# Patient Record
Sex: Male | Born: 1960
Health system: Southern US, Community
[De-identification: ages and names within clinical notes are randomized; demographics above are authoritative.]

## PROBLEM LIST (undated history)

## (undated) DIAGNOSIS — M51369 Other intervertebral disc degeneration, lumbar region without mention of lumbar back pain or lower extremity pain: Secondary | ICD-10-CM

## (undated) DIAGNOSIS — I456 Pre-excitation syndrome: Secondary | ICD-10-CM

## (undated) DIAGNOSIS — N182 Chronic kidney disease, stage 2 (mild): Secondary | ICD-10-CM

## (undated) DIAGNOSIS — F419 Anxiety disorder, unspecified: Secondary | ICD-10-CM

## (undated) DIAGNOSIS — Z8719 Personal history of other diseases of the digestive system: Secondary | ICD-10-CM

## (undated) DIAGNOSIS — I2510 Atherosclerotic heart disease of native coronary artery without angina pectoris: Secondary | ICD-10-CM

## (undated) DIAGNOSIS — I10 Essential (primary) hypertension: Secondary | ICD-10-CM

## (undated) DIAGNOSIS — F17201 Nicotine dependence, unspecified, in remission: Secondary | ICD-10-CM

## (undated) DIAGNOSIS — H9190 Unspecified hearing loss, unspecified ear: Secondary | ICD-10-CM

## (undated) DIAGNOSIS — Z87442 Personal history of urinary calculi: Secondary | ICD-10-CM

## (undated) DIAGNOSIS — G629 Polyneuropathy, unspecified: Secondary | ICD-10-CM

## (undated) DIAGNOSIS — M5136 Other intervertebral disc degeneration, lumbar region: Secondary | ICD-10-CM

## (undated) DIAGNOSIS — Z95810 Presence of automatic (implantable) cardiac defibrillator: Secondary | ICD-10-CM

## (undated) DIAGNOSIS — R079 Chest pain, unspecified: Secondary | ICD-10-CM

## (undated) DIAGNOSIS — I219 Acute myocardial infarction, unspecified: Secondary | ICD-10-CM

## (undated) DIAGNOSIS — K2210 Ulcer of esophagus without bleeding: Secondary | ICD-10-CM

## (undated) DIAGNOSIS — G4730 Sleep apnea, unspecified: Secondary | ICD-10-CM

## (undated) DIAGNOSIS — N289 Disorder of kidney and ureter, unspecified: Secondary | ICD-10-CM

## (undated) DIAGNOSIS — G459 Transient cerebral ischemic attack, unspecified: Secondary | ICD-10-CM

## (undated) DIAGNOSIS — I420 Dilated cardiomyopathy: Secondary | ICD-10-CM

## (undated) DIAGNOSIS — Z972 Presence of dental prosthetic device (complete) (partial): Secondary | ICD-10-CM

## (undated) DIAGNOSIS — I639 Cerebral infarction, unspecified: Secondary | ICD-10-CM

## (undated) DIAGNOSIS — K219 Gastro-esophageal reflux disease without esophagitis: Secondary | ICD-10-CM

## (undated) DIAGNOSIS — I77810 Thoracic aortic ectasia: Secondary | ICD-10-CM

## (undated) HISTORY — DX: Nicotine dependence, unspecified, in remission: F17.201

## (undated) HISTORY — PX: CHOLECYSTECTOMY: SHX55

## (undated) HISTORY — PX: BACK SURGERY: SHX140

## (undated) HISTORY — PX: CHOLECYSTECTOMY, LAPAROSCOPIC: SHX56

## (undated) HISTORY — DX: Thoracic aortic ectasia: I77.810

## (undated) HISTORY — PX: CARDIAC DEFIBRILLATOR PLACEMENT: SHX171

## (undated) HISTORY — PX: CARDIAC CATHETERIZATION: SHX172

## (undated) HISTORY — PX: APPENDECTOMY: SHX54

## (undated) HISTORY — PX: MULTIPLE TOOTH EXTRACTIONS: SHX2053

## (undated) HISTORY — DX: Transient cerebral ischemic attack, unspecified: G45.9

## (undated) HISTORY — DX: Atherosclerotic heart disease of native coronary artery without angina pectoris: I25.10

---

## 1997-08-09 HISTORY — PX: RADIOFREQUENCY ABLATION: SHX2290

## 2000-12-02 ENCOUNTER — Encounter (HOSPITAL_COMMUNITY): Admission: RE | Admit: 2000-12-02 | Discharge: 2001-01-01 | Payer: Self-pay | Admitting: Oncology

## 2000-12-02 ENCOUNTER — Encounter: Admission: RE | Admit: 2000-12-02 | Discharge: 2000-12-02 | Payer: Self-pay | Admitting: Oncology

## 2000-12-23 ENCOUNTER — Encounter (HOSPITAL_COMMUNITY): Admission: RE | Admit: 2000-12-23 | Discharge: 2001-01-22 | Payer: Self-pay | Admitting: Oncology

## 2000-12-23 ENCOUNTER — Encounter (HOSPITAL_COMMUNITY): Payer: Self-pay | Admitting: Oncology

## 2002-05-01 ENCOUNTER — Encounter: Payer: Self-pay | Admitting: Family Medicine

## 2002-05-01 ENCOUNTER — Ambulatory Visit (HOSPITAL_COMMUNITY): Admission: RE | Admit: 2002-05-01 | Discharge: 2002-05-01 | Payer: Self-pay | Admitting: Family Medicine

## 2002-05-17 ENCOUNTER — Ambulatory Visit (HOSPITAL_COMMUNITY): Admission: RE | Admit: 2002-05-17 | Discharge: 2002-05-17 | Payer: Self-pay | Admitting: Pulmonary Disease

## 2002-06-21 ENCOUNTER — Encounter (HOSPITAL_COMMUNITY): Admission: RE | Admit: 2002-06-21 | Discharge: 2002-07-21 | Payer: Self-pay | Admitting: Cardiology

## 2002-06-21 ENCOUNTER — Encounter: Payer: Self-pay | Admitting: Cardiology

## 2002-07-03 ENCOUNTER — Ambulatory Visit (HOSPITAL_COMMUNITY): Admission: RE | Admit: 2002-07-03 | Discharge: 2002-07-03 | Payer: Self-pay | Admitting: Cardiology

## 2002-07-19 ENCOUNTER — Ambulatory Visit (HOSPITAL_COMMUNITY): Admission: RE | Admit: 2002-07-19 | Discharge: 2002-07-19 | Payer: Self-pay | Admitting: Cardiology

## 2002-12-05 ENCOUNTER — Encounter: Payer: Self-pay | Admitting: *Deleted

## 2002-12-05 ENCOUNTER — Emergency Department (HOSPITAL_COMMUNITY): Admission: EM | Admit: 2002-12-05 | Discharge: 2002-12-05 | Payer: Self-pay | Admitting: *Deleted

## 2003-07-12 ENCOUNTER — Ambulatory Visit (HOSPITAL_COMMUNITY): Admission: RE | Admit: 2003-07-12 | Discharge: 2003-07-12 | Payer: Self-pay | Admitting: Family Medicine

## 2003-07-15 ENCOUNTER — Ambulatory Visit (HOSPITAL_COMMUNITY): Admission: RE | Admit: 2003-07-15 | Discharge: 2003-07-15 | Payer: Self-pay | Admitting: Family Medicine

## 2003-12-24 ENCOUNTER — Ambulatory Visit (HOSPITAL_COMMUNITY): Admission: RE | Admit: 2003-12-24 | Discharge: 2003-12-24 | Payer: Self-pay | Admitting: *Deleted

## 2003-12-30 ENCOUNTER — Ambulatory Visit (HOSPITAL_COMMUNITY): Admission: RE | Admit: 2003-12-30 | Discharge: 2003-12-30 | Payer: Self-pay | Admitting: Family Medicine

## 2004-03-20 ENCOUNTER — Ambulatory Visit (HOSPITAL_COMMUNITY): Admission: RE | Admit: 2004-03-20 | Discharge: 2004-03-20 | Payer: Self-pay | Admitting: Family Medicine

## 2005-01-10 ENCOUNTER — Emergency Department (HOSPITAL_COMMUNITY): Admission: EM | Admit: 2005-01-10 | Discharge: 2005-01-10 | Payer: Self-pay | Admitting: Emergency Medicine

## 2005-02-26 ENCOUNTER — Ambulatory Visit (HOSPITAL_COMMUNITY): Admission: RE | Admit: 2005-02-26 | Discharge: 2005-02-26 | Payer: Self-pay | Admitting: Internal Medicine

## 2005-03-04 ENCOUNTER — Ambulatory Visit (HOSPITAL_COMMUNITY): Admission: RE | Admit: 2005-03-04 | Discharge: 2005-03-04 | Payer: Self-pay | Admitting: Family Medicine

## 2006-07-25 ENCOUNTER — Ambulatory Visit (HOSPITAL_COMMUNITY): Admission: RE | Admit: 2006-07-25 | Discharge: 2006-07-25 | Payer: Self-pay | Admitting: Internal Medicine

## 2007-08-10 HISTORY — PX: COLONOSCOPY W/ POLYPECTOMY: SHX1380

## 2007-10-25 ENCOUNTER — Observation Stay (HOSPITAL_COMMUNITY): Admission: EM | Admit: 2007-10-25 | Discharge: 2007-10-26 | Payer: Self-pay | Admitting: Cardiology

## 2007-10-25 ENCOUNTER — Encounter: Payer: Self-pay | Admitting: Emergency Medicine

## 2007-10-25 ENCOUNTER — Ambulatory Visit: Payer: Self-pay | Admitting: Cardiology

## 2007-10-26 ENCOUNTER — Encounter: Payer: Self-pay | Admitting: Cardiology

## 2007-11-02 ENCOUNTER — Ambulatory Visit: Payer: Self-pay | Admitting: Cardiovascular Disease

## 2007-11-06 ENCOUNTER — Ambulatory Visit (HOSPITAL_COMMUNITY): Admission: RE | Admit: 2007-11-06 | Discharge: 2007-11-06 | Payer: Self-pay | Admitting: Cardiovascular Disease

## 2007-11-07 ENCOUNTER — Ambulatory Visit: Payer: Self-pay | Admitting: Cardiology

## 2007-11-08 ENCOUNTER — Ambulatory Visit: Payer: Self-pay | Admitting: Internal Medicine

## 2007-11-08 DIAGNOSIS — Z95810 Presence of automatic (implantable) cardiac defibrillator: Secondary | ICD-10-CM

## 2007-11-08 HISTORY — DX: Presence of automatic (implantable) cardiac defibrillator: Z95.810

## 2007-11-10 ENCOUNTER — Encounter (HOSPITAL_COMMUNITY): Admission: RE | Admit: 2007-11-10 | Discharge: 2007-12-10 | Payer: Self-pay | Admitting: Internal Medicine

## 2007-11-21 ENCOUNTER — Ambulatory Visit: Payer: Self-pay | Admitting: Internal Medicine

## 2007-11-21 ENCOUNTER — Inpatient Hospital Stay (HOSPITAL_COMMUNITY): Admission: RE | Admit: 2007-11-21 | Discharge: 2007-11-22 | Payer: Self-pay | Admitting: Internal Medicine

## 2007-12-04 ENCOUNTER — Ambulatory Visit: Payer: Self-pay

## 2007-12-04 ENCOUNTER — Encounter: Payer: Self-pay | Admitting: Internal Medicine

## 2007-12-06 ENCOUNTER — Ambulatory Visit (HOSPITAL_COMMUNITY): Admission: RE | Admit: 2007-12-06 | Discharge: 2007-12-06 | Payer: Self-pay | Admitting: General Surgery

## 2007-12-06 ENCOUNTER — Encounter (INDEPENDENT_AMBULATORY_CARE_PROVIDER_SITE_OTHER): Payer: Self-pay | Admitting: General Surgery

## 2008-02-08 ENCOUNTER — Ambulatory Visit (HOSPITAL_COMMUNITY): Admission: RE | Admit: 2008-02-08 | Discharge: 2008-02-08 | Payer: Self-pay | Admitting: Internal Medicine

## 2008-02-18 ENCOUNTER — Encounter: Payer: Self-pay | Admitting: Cardiovascular Disease

## 2008-02-18 ENCOUNTER — Ambulatory Visit: Payer: Self-pay | Admitting: Cardiovascular Disease

## 2008-02-18 ENCOUNTER — Inpatient Hospital Stay (HOSPITAL_COMMUNITY): Admission: EM | Admit: 2008-02-18 | Discharge: 2008-02-20 | Payer: Self-pay | Admitting: Emergency Medicine

## 2008-03-05 ENCOUNTER — Ambulatory Visit: Payer: Self-pay | Admitting: Cardiology

## 2008-03-05 ENCOUNTER — Encounter (HOSPITAL_COMMUNITY): Admission: RE | Admit: 2008-03-05 | Discharge: 2008-04-04 | Payer: Self-pay | Admitting: Cardiology

## 2008-04-03 ENCOUNTER — Ambulatory Visit: Payer: Self-pay | Admitting: Internal Medicine

## 2008-04-23 ENCOUNTER — Ambulatory Visit: Payer: Self-pay | Admitting: Cardiology

## 2008-04-24 ENCOUNTER — Ambulatory Visit (HOSPITAL_COMMUNITY): Admission: RE | Admit: 2008-04-24 | Discharge: 2008-04-24 | Payer: Self-pay | Admitting: Cardiology

## 2008-05-20 ENCOUNTER — Ambulatory Visit: Payer: Self-pay | Admitting: Cardiology

## 2008-06-20 ENCOUNTER — Ambulatory Visit: Payer: Self-pay | Admitting: Cardiology

## 2008-06-25 ENCOUNTER — Encounter (HOSPITAL_COMMUNITY): Admission: RE | Admit: 2008-06-25 | Discharge: 2008-07-25 | Payer: Self-pay | Admitting: Cardiology

## 2008-07-01 ENCOUNTER — Ambulatory Visit: Payer: Self-pay | Admitting: Internal Medicine

## 2008-08-21 ENCOUNTER — Ambulatory Visit: Payer: Self-pay | Admitting: Gastroenterology

## 2008-08-22 ENCOUNTER — Encounter: Payer: Self-pay | Admitting: Gastroenterology

## 2008-08-22 LAB — CONVERTED CEMR LAB
A-1 Antitrypsin, Ser: 148 mg/dL (ref 83–200)
Anti Nuclear Antibody(ANA): NEGATIVE
Ceruloplasmin: 34 mg/dL (ref 21–63)
IgA: 75 mg/dL (ref 68–378)
TIBC: 316 ug/dL (ref 215–435)
UIBC: 184 ug/dL

## 2008-08-27 ENCOUNTER — Encounter: Payer: Self-pay | Admitting: Gastroenterology

## 2008-08-27 ENCOUNTER — Ambulatory Visit: Payer: Self-pay | Admitting: Gastroenterology

## 2008-08-27 ENCOUNTER — Ambulatory Visit (HOSPITAL_COMMUNITY): Admission: RE | Admit: 2008-08-27 | Discharge: 2008-08-27 | Payer: Self-pay | Admitting: Gastroenterology

## 2008-09-04 ENCOUNTER — Ambulatory Visit (HOSPITAL_COMMUNITY): Admission: RE | Admit: 2008-09-04 | Discharge: 2008-09-04 | Payer: Self-pay | Admitting: Family Medicine

## 2008-09-30 ENCOUNTER — Ambulatory Visit: Payer: Self-pay | Admitting: Internal Medicine

## 2008-10-18 ENCOUNTER — Encounter: Payer: Self-pay | Admitting: Internal Medicine

## 2008-10-30 ENCOUNTER — Ambulatory Visit: Payer: Self-pay | Admitting: Cardiology

## 2008-10-31 DIAGNOSIS — K219 Gastro-esophageal reflux disease without esophagitis: Secondary | ICD-10-CM | POA: Insufficient documentation

## 2008-10-31 DIAGNOSIS — D126 Benign neoplasm of colon, unspecified: Secondary | ICD-10-CM

## 2008-10-31 DIAGNOSIS — H9190 Unspecified hearing loss, unspecified ear: Secondary | ICD-10-CM | POA: Insufficient documentation

## 2008-12-07 ENCOUNTER — Encounter: Payer: Self-pay | Admitting: Orthopedic Surgery

## 2008-12-07 ENCOUNTER — Emergency Department (HOSPITAL_COMMUNITY): Admission: EM | Admit: 2008-12-07 | Discharge: 2008-12-07 | Payer: Self-pay | Admitting: Emergency Medicine

## 2008-12-09 ENCOUNTER — Ambulatory Visit: Payer: Self-pay | Admitting: Orthopedic Surgery

## 2008-12-09 DIAGNOSIS — M25569 Pain in unspecified knee: Secondary | ICD-10-CM

## 2008-12-10 ENCOUNTER — Encounter: Payer: Self-pay | Admitting: Orthopedic Surgery

## 2008-12-24 DIAGNOSIS — D751 Secondary polycythemia: Secondary | ICD-10-CM | POA: Insufficient documentation

## 2008-12-24 DIAGNOSIS — G5600 Carpal tunnel syndrome, unspecified upper limb: Secondary | ICD-10-CM

## 2008-12-25 ENCOUNTER — Ambulatory Visit: Payer: Self-pay | Admitting: Internal Medicine

## 2008-12-25 ENCOUNTER — Encounter: Payer: Self-pay | Admitting: Internal Medicine

## 2009-02-12 ENCOUNTER — Encounter (INDEPENDENT_AMBULATORY_CARE_PROVIDER_SITE_OTHER): Payer: Self-pay | Admitting: *Deleted

## 2009-03-24 ENCOUNTER — Encounter: Payer: Self-pay | Admitting: Internal Medicine

## 2009-03-24 ENCOUNTER — Ambulatory Visit: Payer: Self-pay | Admitting: Internal Medicine

## 2009-03-31 ENCOUNTER — Encounter: Payer: Self-pay | Admitting: Internal Medicine

## 2009-05-09 ENCOUNTER — Encounter: Payer: Self-pay | Admitting: Cardiology

## 2009-06-23 ENCOUNTER — Ambulatory Visit: Payer: Self-pay | Admitting: Internal Medicine

## 2009-06-27 ENCOUNTER — Ambulatory Visit: Payer: Self-pay | Admitting: Cardiology

## 2009-06-27 DIAGNOSIS — M109 Gout, unspecified: Secondary | ICD-10-CM | POA: Insufficient documentation

## 2009-06-27 DIAGNOSIS — F17201 Nicotine dependence, unspecified, in remission: Secondary | ICD-10-CM

## 2009-06-27 DIAGNOSIS — R740 Nonspecific elevation of levels of transaminase and lactic acid dehydrogenase [LDH]: Secondary | ICD-10-CM

## 2009-06-27 DIAGNOSIS — E1159 Type 2 diabetes mellitus with other circulatory complications: Secondary | ICD-10-CM

## 2009-06-27 HISTORY — DX: Nicotine dependence, unspecified, in remission: F17.201

## 2009-06-30 ENCOUNTER — Encounter: Payer: Self-pay | Admitting: Cardiology

## 2009-06-30 ENCOUNTER — Encounter (INDEPENDENT_AMBULATORY_CARE_PROVIDER_SITE_OTHER): Payer: Self-pay | Admitting: *Deleted

## 2009-06-30 LAB — CONVERTED CEMR LAB
ALT: 26 units/L
ALT: 26 units/L (ref 0–53)
AST: 18 units/L
AST: 18 units/L (ref 0–37)
Albumin: 4.9 g/dL
Albumin: 4.9 g/dL (ref 3.5–5.2)
Alkaline Phosphatase: 118 units/L
Alkaline Phosphatase: 118 units/L — ABNORMAL HIGH (ref 39–117)
BUN: 19 mg/dL
Basophils Absolute: 0 10*3/uL
Basophils Absolute: 0 10*3/uL (ref 0.0–0.1)
Basophils Relative: 0 %
Basophils Relative: 0 % (ref 0–1)
Calcium: 9.6 mg/dL
Eosinophils Absolute: 0.1 10*3/uL
Glucose, Bld: 103 mg/dL
HCT: 49.2 %
Hemoglobin: 16.7 g/dL
Hemoglobin: 16.7 g/dL (ref 13.0–17.0)
Lymphocytes Relative: 15 % (ref 12–46)
MCHC: 33.9 g/dL (ref 30.0–36.0)
MCV: 94.8 fL
Monocytes Absolute: 0.6 10*3/uL (ref 0.1–1.0)
Monocytes Relative: 7 %
Neutro Abs: 6.1 10*3/uL (ref 1.7–7.7)
Neutrophils Relative %: 77 % (ref 43–77)
Platelets: 143 10*3/uL
Platelets: 143 10*3/uL — ABNORMAL LOW (ref 150–400)
Potassium: 4.4 meq/L
Potassium: 4.4 meq/L (ref 3.5–5.3)
Pro B Natriuretic peptide (BNP): 7.2 pg/mL (ref 0.0–100.0)
RDW: 13.9 %
RDW: 13.9 % (ref 11.5–15.5)
Sodium: 139 meq/L
Sodium: 139 meq/L (ref 135–145)
Total Bilirubin: 0.8 mg/dL (ref 0.3–1.2)
Total Protein: 6.9 g/dL (ref 6.0–8.3)

## 2009-07-01 ENCOUNTER — Encounter (INDEPENDENT_AMBULATORY_CARE_PROVIDER_SITE_OTHER): Payer: Self-pay | Admitting: *Deleted

## 2009-07-02 ENCOUNTER — Encounter: Payer: Self-pay | Admitting: Internal Medicine

## 2009-07-17 IMAGING — CR DG KNEE COMPLETE 4+V*R*
4 series · 4 of 4 positions shown · non-contrast
Comparison: None

CLINICAL DATA: Knee pain and swelling.

RIGHT KNEE - COMPLETE 4+ VIEW

[view not recorded (1 of 4)]
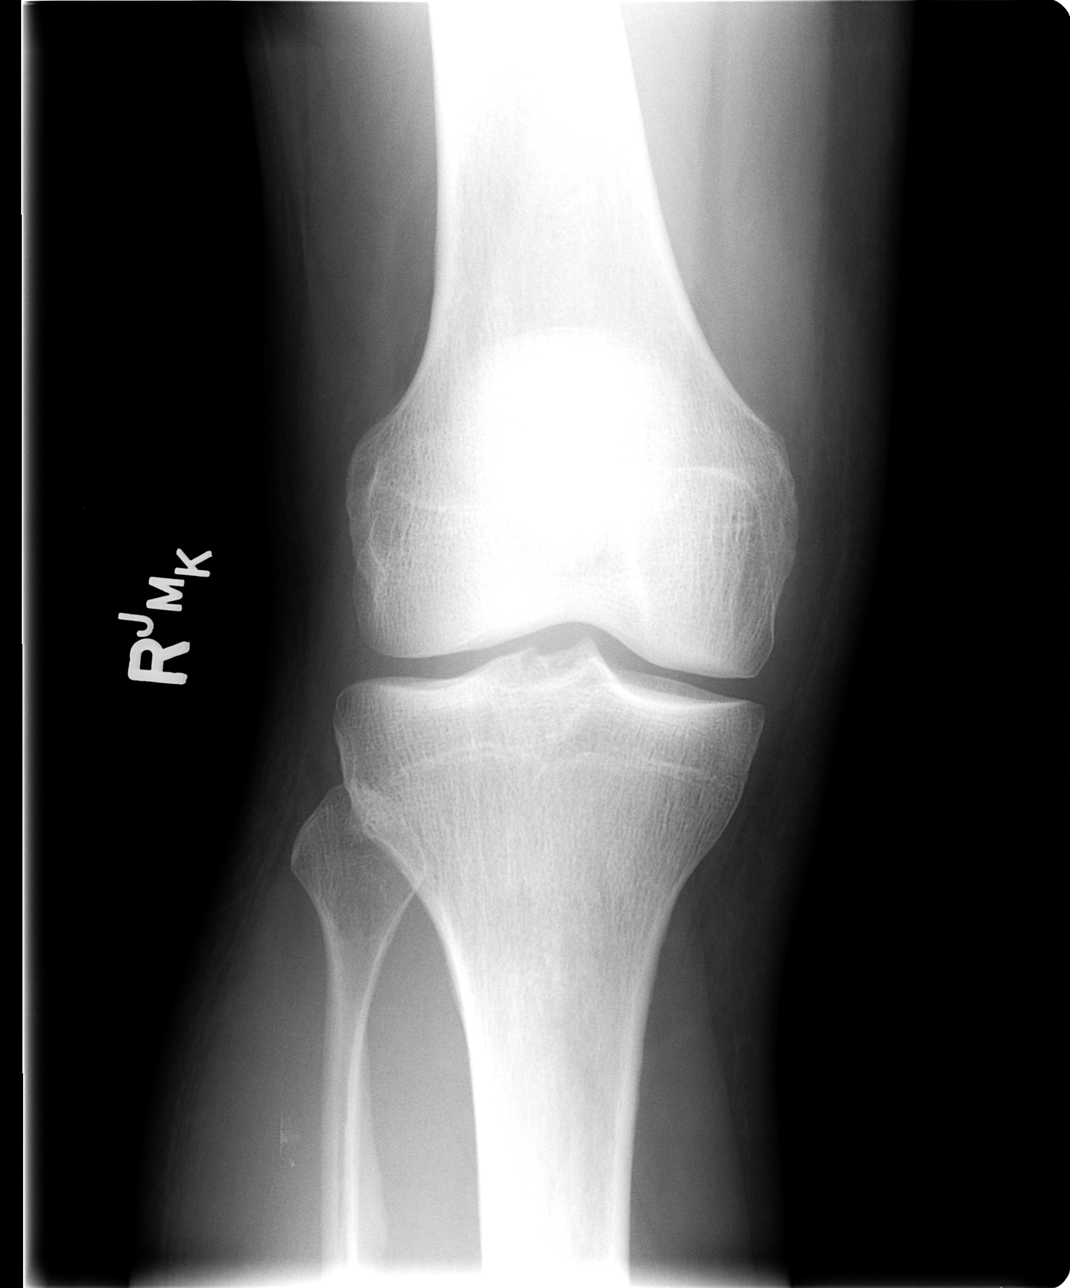

[view not recorded (2 of 4)]
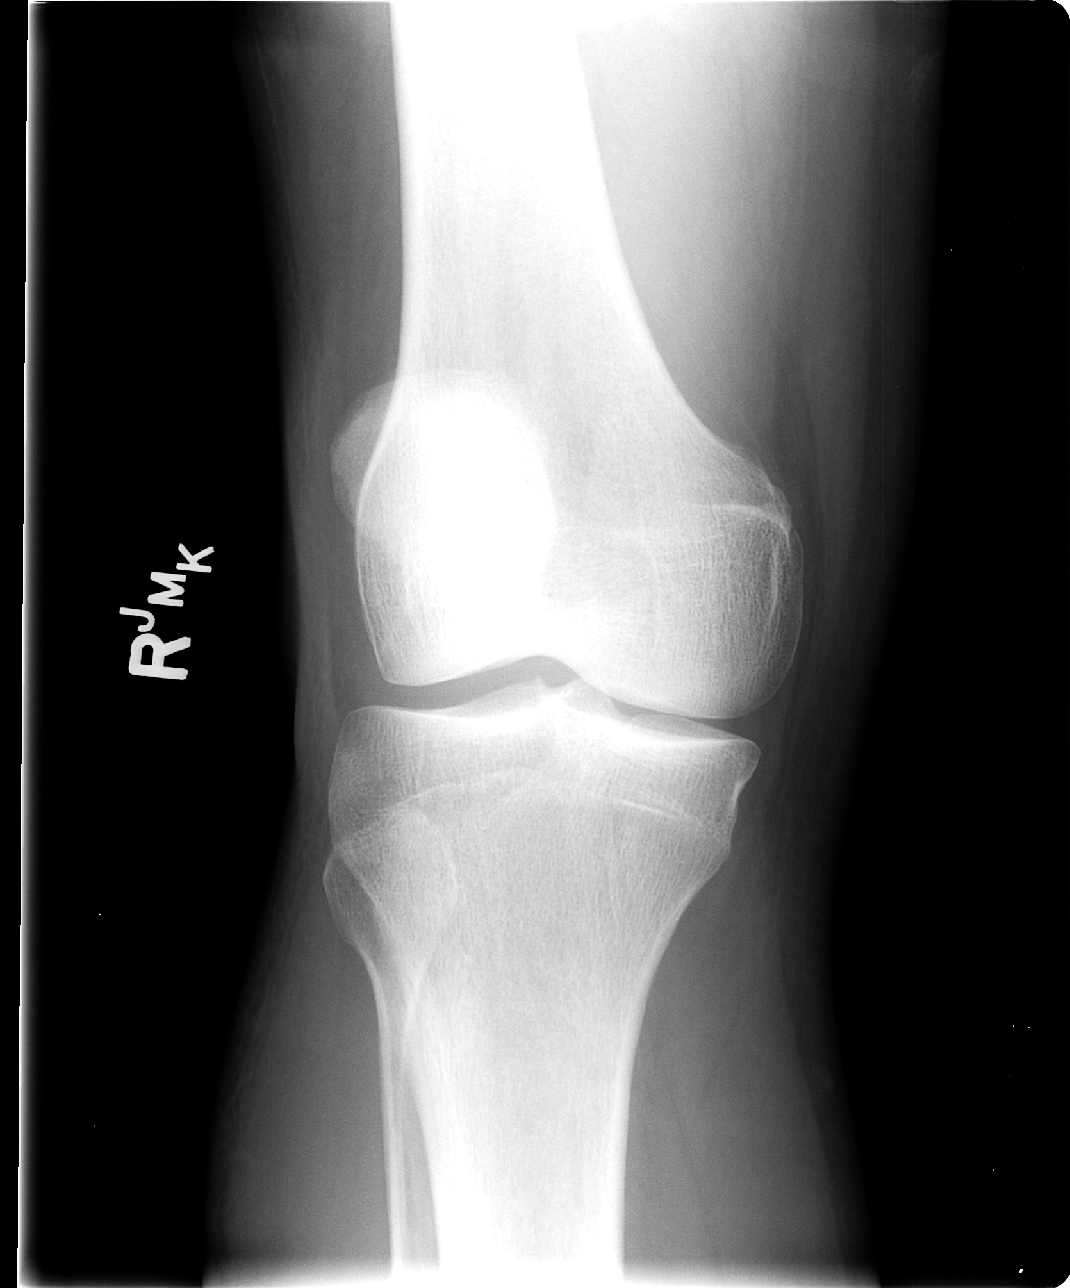

[view not recorded (3 of 4)]
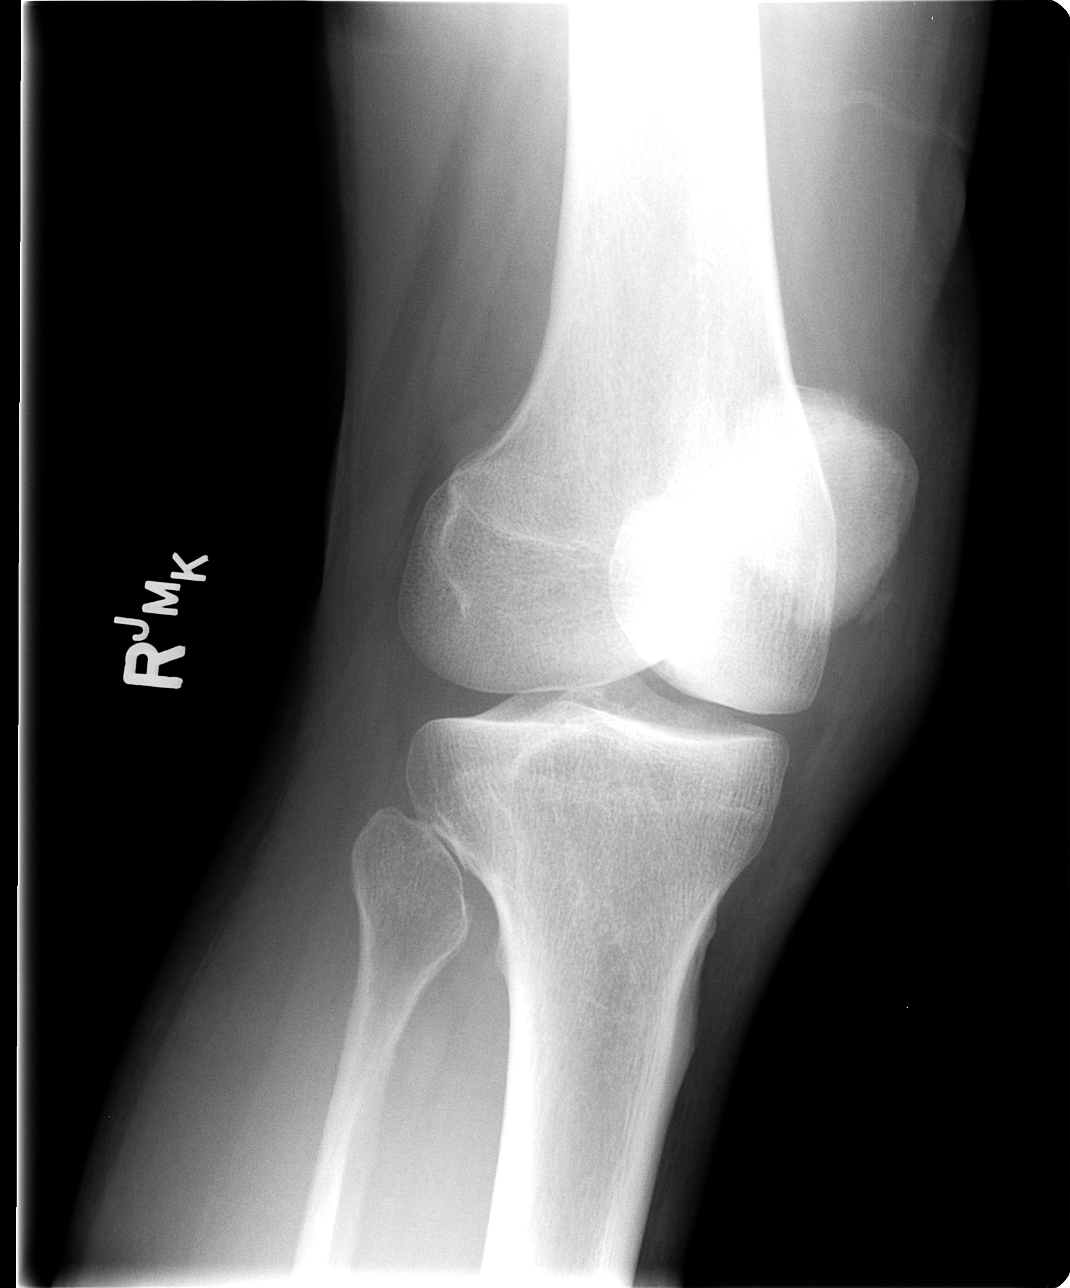

[view not recorded (4 of 4)]
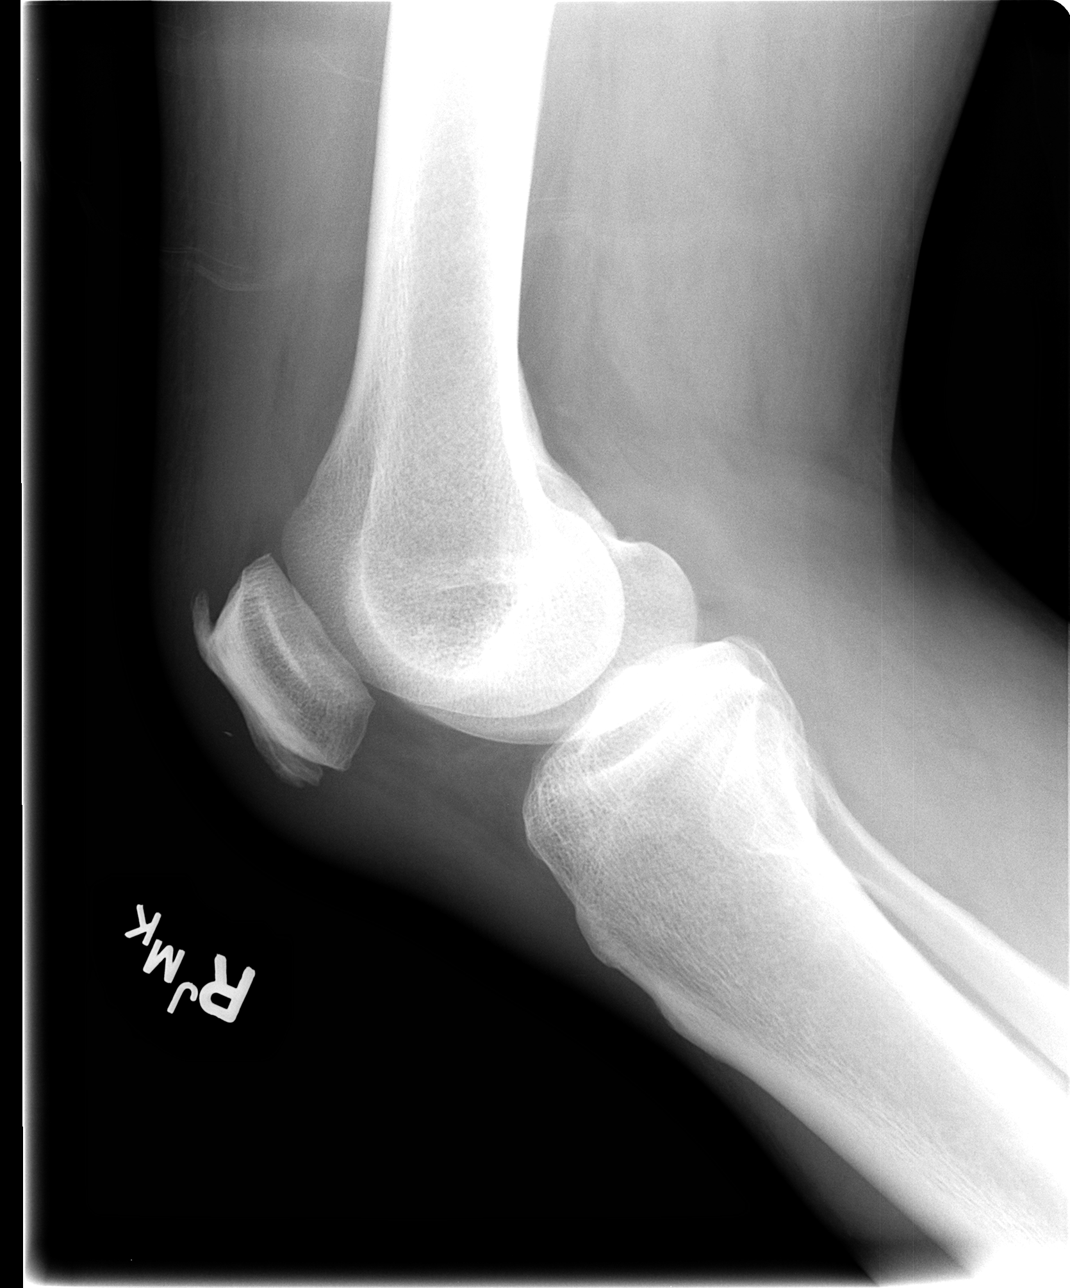

[4 of 4 positions shown; findings below may reference images not displayed]

FINDINGS: There is no evidence of fracture or dislocation.  There
is no evidence of knee joint effusion.

Prepatellar soft tissue swelling is seen, with enthesopathic
changes at the insertion sites of suprapatellar and infrapatellar
tendons.
IMPRESSION: 1.  Prepatellar soft tissue swelling, with enthesopathic changes at
supra and infrapatellar tendon insertion sites.
2.  No evidence of fracture or joint effusion.

## 2009-08-09 DIAGNOSIS — I219 Acute myocardial infarction, unspecified: Secondary | ICD-10-CM

## 2009-08-09 HISTORY — DX: Acute myocardial infarction, unspecified: I21.9

## 2009-10-03 ENCOUNTER — Encounter: Payer: Self-pay | Admitting: Internal Medicine

## 2009-10-20 ENCOUNTER — Encounter: Payer: Self-pay | Admitting: Internal Medicine

## 2009-11-19 ENCOUNTER — Telehealth: Payer: Self-pay | Admitting: Cardiology

## 2010-01-07 ENCOUNTER — Ambulatory Visit: Payer: Self-pay | Admitting: Internal Medicine

## 2010-01-07 ENCOUNTER — Encounter: Payer: Self-pay | Admitting: Cardiology

## 2010-01-08 ENCOUNTER — Encounter: Payer: Self-pay | Admitting: Internal Medicine

## 2010-01-09 ENCOUNTER — Encounter (HOSPITAL_COMMUNITY): Admission: RE | Admit: 2010-01-09 | Discharge: 2010-01-09 | Payer: Self-pay | Admitting: Internal Medicine

## 2010-01-09 ENCOUNTER — Ambulatory Visit: Payer: Self-pay | Admitting: Cardiology

## 2010-01-09 ENCOUNTER — Encounter: Payer: Self-pay | Admitting: Cardiology

## 2010-01-16 ENCOUNTER — Encounter: Payer: Self-pay | Admitting: Internal Medicine

## 2010-02-11 ENCOUNTER — Telehealth (INDEPENDENT_AMBULATORY_CARE_PROVIDER_SITE_OTHER): Payer: Self-pay

## 2010-02-17 ENCOUNTER — Telehealth (INDEPENDENT_AMBULATORY_CARE_PROVIDER_SITE_OTHER): Payer: Self-pay

## 2010-02-24 ENCOUNTER — Ambulatory Visit: Payer: Self-pay | Admitting: Internal Medicine

## 2010-02-24 DIAGNOSIS — I428 Other cardiomyopathies: Secondary | ICD-10-CM | POA: Insufficient documentation

## 2010-02-24 DIAGNOSIS — Z95810 Presence of automatic (implantable) cardiac defibrillator: Secondary | ICD-10-CM

## 2010-03-11 ENCOUNTER — Encounter (INDEPENDENT_AMBULATORY_CARE_PROVIDER_SITE_OTHER): Payer: Self-pay | Admitting: *Deleted

## 2010-03-17 ENCOUNTER — Encounter (INDEPENDENT_AMBULATORY_CARE_PROVIDER_SITE_OTHER): Payer: Self-pay | Admitting: *Deleted

## 2010-03-17 ENCOUNTER — Ambulatory Visit: Payer: Self-pay | Admitting: Cardiology

## 2010-05-05 ENCOUNTER — Ambulatory Visit: Payer: Self-pay | Admitting: Internal Medicine

## 2010-05-05 ENCOUNTER — Encounter: Payer: Self-pay | Admitting: Cardiology

## 2010-05-05 ENCOUNTER — Emergency Department (HOSPITAL_COMMUNITY): Admission: EM | Admit: 2010-05-05 | Discharge: 2010-05-05 | Payer: Self-pay | Admitting: Emergency Medicine

## 2010-05-07 ENCOUNTER — Ambulatory Visit: Payer: Self-pay | Admitting: Cardiology

## 2010-05-07 ENCOUNTER — Emergency Department (HOSPITAL_COMMUNITY): Admission: EM | Admit: 2010-05-07 | Discharge: 2010-05-07 | Payer: Self-pay | Admitting: Emergency Medicine

## 2010-05-07 ENCOUNTER — Encounter: Payer: Self-pay | Admitting: Cardiology

## 2010-05-11 ENCOUNTER — Emergency Department (HOSPITAL_COMMUNITY): Admission: EM | Admit: 2010-05-11 | Discharge: 2010-05-11 | Payer: Self-pay | Admitting: Emergency Medicine

## 2010-05-20 ENCOUNTER — Encounter (INDEPENDENT_AMBULATORY_CARE_PROVIDER_SITE_OTHER): Payer: Self-pay | Admitting: *Deleted

## 2010-05-20 ENCOUNTER — Ambulatory Visit: Payer: Self-pay | Admitting: Orthopedic Surgery

## 2010-05-20 DIAGNOSIS — M7020 Olecranon bursitis, unspecified elbow: Secondary | ICD-10-CM

## 2010-05-22 ENCOUNTER — Encounter: Payer: Self-pay | Admitting: Orthopedic Surgery

## 2010-05-25 ENCOUNTER — Telehealth: Payer: Self-pay | Admitting: Orthopedic Surgery

## 2010-05-27 ENCOUNTER — Encounter (INDEPENDENT_AMBULATORY_CARE_PROVIDER_SITE_OTHER): Payer: Self-pay | Admitting: *Deleted

## 2010-05-27 ENCOUNTER — Ambulatory Visit: Payer: Self-pay | Admitting: Orthopedic Surgery

## 2010-06-01 ENCOUNTER — Ambulatory Visit: Payer: Self-pay | Admitting: Orthopedic Surgery

## 2010-06-01 ENCOUNTER — Encounter (INDEPENDENT_AMBULATORY_CARE_PROVIDER_SITE_OTHER): Payer: Self-pay | Admitting: *Deleted

## 2010-06-02 ENCOUNTER — Encounter (INDEPENDENT_AMBULATORY_CARE_PROVIDER_SITE_OTHER): Payer: Self-pay | Admitting: *Deleted

## 2010-06-02 ENCOUNTER — Telehealth: Payer: Self-pay | Admitting: Orthopedic Surgery

## 2010-06-03 ENCOUNTER — Encounter: Payer: Self-pay | Admitting: Orthopedic Surgery

## 2010-06-09 HISTORY — PX: ELBOW SURGERY: SHX618

## 2010-06-12 ENCOUNTER — Ambulatory Visit: Payer: Self-pay | Admitting: Orthopedic Surgery

## 2010-06-12 ENCOUNTER — Ambulatory Visit (HOSPITAL_COMMUNITY): Admission: RE | Admit: 2010-06-12 | Discharge: 2010-06-12 | Payer: Self-pay | Admitting: Orthopedic Surgery

## 2010-06-16 ENCOUNTER — Ambulatory Visit: Payer: Self-pay | Admitting: Orthopedic Surgery

## 2010-06-18 ENCOUNTER — Ambulatory Visit: Payer: Self-pay | Admitting: Orthopedic Surgery

## 2010-06-23 ENCOUNTER — Encounter: Payer: Self-pay | Admitting: Orthopedic Surgery

## 2010-06-25 ENCOUNTER — Ambulatory Visit: Payer: Self-pay | Admitting: Orthopedic Surgery

## 2010-08-28 ENCOUNTER — Telehealth (INDEPENDENT_AMBULATORY_CARE_PROVIDER_SITE_OTHER): Payer: Self-pay

## 2010-08-30 ENCOUNTER — Encounter: Payer: Self-pay | Admitting: Internal Medicine

## 2010-09-08 NOTE — Progress Notes (Signed)
Summary: elbow is swollen  Phone Note Call from Patient   Summary of Call: Mike Floyd went back to work today and his elbow has swollen right much, hurts some.  He is scheduled for a followup apppointment Wednesday (05/27/10)  Does he need to come in sooner or any other advice.  Please advise His # is [id number] Initial call taken by: Jacklynn Ganong,  May 25, 2010 3:12 PM  Follow-up for Phone Call        no but if he needs to he can stay out until he comes in weds  Follow-up by: Fuller Canada MD,  May 25, 2010 3:14 PM  Additional Follow-up for Phone Call Additional follow up Details #1::        Advised of doctor's reply Additional Follow-up by: Jacklynn Ganong,  May 26, 2010 7:38 AM

## 2010-09-08 NOTE — Letter (Signed)
Summary: Out of Work  [facility]  [address]  [place], [place] [address]   Phone: [phone]  Fax: [phone]    May 20, 2010   Employee:  BRASEN BUNDREN    To Whom It May Concern:   For Medical reasons, please excuse the above named employee from work for the following dates:  Start:   May 20, 2010  End:   October 17,2011                                                                   (Next appointment scheduled May 27, 2010)  If you need additional information, please feel free to contact our office.         Sincerely,    Terrance Mass, MD

## 2010-09-08 NOTE — Assessment & Plan Note (Signed)
Summary: POST OP 1/LT ELBOW SURG 06/12/10/[employer]BS/CAF   Referring Provider:  Dr. Wende Crease Primary Provider:  Dr. Sherwood Gambler   History of Present Illness: DOS November 4.  Procedure excision of LEFT olecranon bursa with removal of spur.  Medication: Percocoet 5mg   Soreness LEFT elbow.  Incision looks good with a little bit of redness around the edges.  The pathology report as follows:1. Bone, fragment(s), left elbow bone spur :  - REACTIVE LAMELLAR BONE AND INFLAMED FIBROUS TISSUE WITH MULTINUCLEATED GIANT CELLS.   2. Bursa, biopsy, left elbow bursa tissue :  - INFLAMED FIBROUS TISSUE WITH SCATTERED MULTINUCLEATED GIANT CELLS, AND DYSTROPHIC CALCIFICATIONS.  Recommend him back for staple removal in 10 days   Allergies: 1)  ! * Latex 2)  ! * Contrast Dye 3)  ! * Shellfish   Other Orders: Post-Op Check (14782)  Patient Instructions: 1)  remove the staples on hte 17th  2)  keep clean and dry    Orders Added: 1)  Post-Op Check [95621]

## 2010-09-08 NOTE — Assessment & Plan Note (Signed)
Summary: RE-CK LT ELBOW/BCBS/CAF   Visit Type:  Follow-up Referring Provider:  Dr. Wende Crease Primary Provider:  Dr. Sherwood Gambler  CC:  recheck elbow.  History of Present Illness: I saw Mike Floyd in the office today for an initial visit.  He is a 50 years old man with the complaint of:  left ebow pain and swelling.  Meds: Klor Con, Multivitamin, Gabapentin 300mg , Protonix 40mg , Flexeril 10mg , Lisinopril 20mg , Coreg 12.5mg , Glipizide er 25mg , Hydrocodone 10.5 as needed, digoxin 0.125, Allopurinol 300mg  daily.  Followup following  after 2 aspirations and injections of the LEFT elbow olecranon bursa.  Patient was on Bactrim, Hydrocodone 10/325 still takes,  He says he doesn't feel well he has been having some shortness of breath as well as nausea and some pain in his chest but no cough.  He went to the emergency room for shortness of breath about 2 weeks ago and was worked up and said to have a possible heart attack although no specific treatment was initiated  I've advised him that he should have a olecranon bursectomy and removal of a olecranon spur but he says he has to go on a fishing trip so we will go ahead with that, see his primary care doctor for further evaluation of his chest pain and nausea and then hopefully have the surgery next week         Current Medications (verified): 1)  Multivitamins  Tabs (Multiple Vitamin) .... Once Daily 2)  Gabapentin 300 Mg Caps (Gabapentin) .... Three Times A Day 3)  Protonix 40 Mg Tbec (Pantoprazole Sodium) .... Once Daily 4)  Cyclobenzaprine Hcl 10 Mg Tabs (Cyclobenzaprine Hcl) .... Three Times A Day 5)  Hydrocodone-Acetaminophen 10-650 Mg Tabs (Hydrocodone-Acetaminophen) .... As Needed 6)  Lisinopril 20 Mg Tabs (Lisinopril) .... Take One Tablet By Mouth Daily 7)  Carvedilol 25 Mg Tabs (Carvedilol) .... Take One Tablet By Mouth Twice A Day 8)  Glipizide 2.5 Mg Xr24h-Tab (Glipizide) .... Once Daily 9)  Nitroglycerin 0.4 Mg Subl (Nitroglycerin)  .... Place 1 Tablet Under Tongue As Directed 10)  Digoxin 0.125 Mg Tabs (Digoxin) .... Take 1 Tablet By Mouth Once Daily 11)  Bactrim Ds 800-160 Mg Tabs (Sulfamethoxazole-Trimethoprim) .Marland Kitchen.. 1 By Mouth Two Times A Day  Allergies (verified): 1)  ! * Latex 2)  ! * Contrast Dye 3)  ! * Shellfish  Past History:  Past Medical History: Last updated: 03/17/2010 NonischemicCARDIOMYOPATHY (ICD-425.4)-onset 2003; normal coronary angiography in 07/2002;       ejection fraction 30-35% in 2005; ejection fraction 49% by MUGA in 11/09 and by stress nuclear in 01/2010;       assessment of LV function by echocardiography not feasible due to the poor quality images       Automatic implantable cardiac defibrillator implanted in 11/2007 ([employer])       repeat cath in 10/2007-normal coronary arteries; ejection fraction of 35-40%; normal hemodynamics Wolff-Parkinson-White with PSVT-RFA performed at [facility] in 1999-Dr. Sampson Goon Tobacco abuse Chronic kidney disease-Stage II-III; creatinine of 1.8 in 3/09 and 1.3 and 08/2008;       admitted 7/09 with acute on chronic renal insufficiency that resolved Gastroesophageal reflux disease AODM-no insulin; one A1c of 5.5 and 05/2008 Abnormal hepatic profile with elevated SGOT and SGPT in 08/2008 Gout CARPAL TUNNEL SYNDROME (ICD-354.0) POLYCYTHEMIA (ICD-289.0) Degenerative joint disease: Chronic low back pain with prior lumbosacral spinal surgery; knee pain HX, FAMILY, KIDNEY DISEASE NEC (ICD-V18.69) FAMILY HISTORY OF ARTHRITIS (ICD-V17.7) FAMILY HISTORY OF DIABETES (ICD-V18.0) COLONIC POLYPS (ICD-211.3) DM (  ICD-250.00) HEARING IMPAIRMENT (ICD-389.9) GERD (ICD-530.81)  Past Surgical History: Last updated: 06/27/2009 Appendectomy 1985 Lumbosacral spine fusion- 1995 Status post AICD placement in April 2009 by Dr. Graciela Husbands ( VR ICD model 1207, serial [phone]) Laparoscopic cholecystectomy-11/2008 heel spur both feet  Family History: Last updated: 12/09/2008 FH of  Cancer:  Family History of Diabetes Family History Coronary Heart Disease male < 23 Family History of Arthritis Hx, family, kidney disease NEC  Social History: Last updated: 05/20/2010 Patient is married.  truck mechanic/welder/truck driver Tobacco Use - 1ppd Alcohol Use - no Drug Use - no caffeine used daily 8th grade ed  Risk Factors: Alcohol Use: 0 (12/09/2008) Caffeine Use: 8 per day (12/09/2008)  Risk Factors: Smoking Status: quit (12/24/2008)  Physical Exam  Additional Exam:  GEN: well developed, well nourished, normal grooming and hygiene, no deformity and normal body habitus.   CDV: pulses are normal, no edema, no erythema. no tenderness  Lymph: normal lymph nodes   Skin: no rashes, skin lesions or open sores   NEURO: normal coordination, reflexes, sensation.   Psyche: awake, alert and oriented. Mood normal   Gait: normal  His LEFT elbow is swollen and tender with some redness over the skin of the olecranon bursa.  He has normal range of motion with maybe slight loss of extension of 10.  Motor exam is normal.  He is stable to varus valgus stress.       Impression & Recommendations:  Problem # 1:  BURSITIS, LEFT ELBOW (ICD-726.33) Assessment Unchanged  The patient has failed 2 aspirations and injection of the LEFT elbow for bursitis he also has a spur seen on x-ray  Recommend excision of the LEFT olecranon bursa and removal of the olecranon spur  [ Prior to surgery patient is to have a medical consult and he also has to have his defibrillator turned off so we will call the company.]  Orders: Est. Patient Level IV (04540)  Patient Instructions: 1)  DOS 06/12/10 at [facility] hospital 2)  preop at [facility] stay on 06/08/10 at 1230pm, take packet with you 3)  post op 1 in our office 06/16/10   Orders Added: 1)  Est. Patient Level IV [98119]

## 2010-09-08 NOTE — Miscellaneous (Signed)
Summary: call to [employer], No  pre-certification required out-patient surgery  Clinical Lists Changes   Contact to insurer [employer] re:  oout-patient surgery scheduled 06/05/10 at [facility] re: pre-authorization. Ph [phone]    CPT  [phone]   ICD9 Dx  726.33 Spoke w/Tyra Wl, no pre-cert required for out-patient surgery for in-network.  Appended Document: call to [facility] checking on pre-certification out-patient surgery Appended: Correct date for out-patient surgery at [facility] (  CPT  [phone],    DX:  726.33): 06/12/10.

## 2010-09-08 NOTE — Assessment & Plan Note (Signed)
Summary: check incision,bleeding thru bandage/post op/bcbs/bsf   Visit Type:  Follow-up Referring Provider:  Dr. Wende Crease Primary Provider:  Dr. Sherwood Gambler  CC:  post op .  History of Present Illness: I saw Mike Floyd in the office today for a followup visit.  He is a 50 years old man with the complaint of:  post op olecranon bursectomy with exostectomy left elbow.  DOS 06/12/10.  POD 6.  Having alot of drainage through bandage, some redness, no odor, no increased pain or swelling, no fever.  Percocet 5 for pain.  Coming back on the 17th for staple removal.    Allergies: 1)  ! * Latex 2)  ! * Contrast Dye 3)  ! * Shellfish  Physical Exam  Skin:  clean,   no erythema  minimal sanguinous drainage    Impression & Recommendations:  Problem # 1:  BURSITIS, LEFT ELBOW (ICD-726.33) Assessment Comment Only  Orders: Post-Op Check (13086)  Patient Instructions: 1)  keep appt for the 17th   Orders Added: 1)  Post-Op Check [57846]

## 2010-09-08 NOTE — Progress Notes (Signed)
Summary: refill request  Phone Note Refill Request Message from:  Pharmacy  digoxin .125 mg   qty 30  1 qd -would like refills- [facility] [place] [phone]    Method Requested: Telephone to Pharmacy Initial call taken by: Glynda Jaeger,  February 11, 2010 2:06 PM  Follow-up for Phone Call        Left message with [facility] pharmacy to have pt. call this office. Dig was stopped on last visit. Follow-up by: Larita Fife Via LPN,  February 13, 4400 2:52 PM

## 2010-09-08 NOTE — Letter (Signed)
Summary: Device-Delinquent Phone Journalist, newspaper, Main Office  [address]   [place], [place] [address]   Phone: [phone]  Fax: [phone]     October 03, 2009 MRN: [id number]   [facility] [address] [place]  [address]   Dear Mr. Panagopoulos,  According to our records, you were scheduled for a device phone transmission on  September 22, 2009.     We did not receive any results from this check.  If you transmitted on your scheduled day, please call us to help troubleshoot your system.  If you forgot to send your transmission, please send one upon receipt of this letter.  Thank you,   Architectural technologist [facility]

## 2010-09-08 NOTE — Assessment & Plan Note (Signed)
Summary: REMOVE STITCHES/POST OP 2  SUR 06/12/10/BCBS   Visit Type:  Follow-up Referring Provider:  Dr. Wende Crease Primary Provider:  Dr. Sherwood Gambler  CC:  post op 2 elbow.  History of Present Illness: I saw Mike Floyd in the office today for a followup visit.  He is a 50 years old man with the complaint of:  post op olecranon bursectomy with exostectomy left elbow.  DOS 06/12/10.  POD 13  Percocet 5 for pain, helps.  Today is for staple removal.  Doing well.    Allergies: 1)  ! * Latex 2)  ! * Contrast Dye 3)  ! * Shellfish   Shoulder/Elbow Exam  General:    Well-developed, well-nourished, normal body habitus; no deformities, normal grooming.    Skin:    incision looks great      Impression & Recommendations:  Problem # 1:  AFTERCARE FOLLOW SURGERY MUSCULOSKEL SYSTEM NEC (ICD-V58.78)  Orders: Post-Op Check ([address])  Problem # 2:  BURSITIS, LEFT ELBOW (ICD-726.33)  Orders: Post-Op Check (60454)  Patient Instructions: 1)  Please schedule a follow-up appointment as needed. 2)  keep steri strips on x 1 week, then remove [replace as needed]   Orders Added: 1)  Post-Op Check [09811]

## 2010-09-08 NOTE — Letter (Signed)
Summary: Out of Work  [facility]  [address]  [place], [place] [address]   Phone: [phone]  Fax: [phone]    May 27, 2010   Employee:  LYNDON CHAPEL    To Whom It May Concern:   For Medical reasons, please excuse the above named employee from work for the following dates, following appointment in our office today, 05/27/10:  Re-Start out of work:   05/28/10  End:   06/01/10  Next appointment:  06/01/10  Approximate return to work:  06/02/10 or as otherwise notified   If you need additional information, please feel free to contact our office.         Sincerely,    Terrance Mass, MD

## 2010-09-08 NOTE — Assessment & Plan Note (Signed)
Summary: 1 WK RE-CK LT ELBOW/BCBS/CAF   Visit Type:  Follow-up Referring Deni Berti:  Dr. Wende Crease Primary Dynastie Knoop:  Dr. Sherwood Gambler  CC:  recheck elbow.  History of Present Illness: I saw Mike Floyd in the office today for an initial visit.  He is a 50 years old man with the complaint of:  left ebow pain and swelling.  Meds: Klor Con, Multivitamin, Gabapentin 300mg , Protonix 40mg , Flexeril 10mg , Lisinopril 20mg , Coreg 12.5mg , Glipizide er 25mg , Hydrocodone 10.5 as needed, digoxin 0.125, Allopurinol 300mg  daily.  Today is one week recheck left elbow after aspiration, injection, ice, compression and ATBS.  The knot went down some, finished ATBS, Hydrocodone 10/325 still takes, no drainage, the knot is warm  Exam:  shows redness over the olecranon bursa. The patella and thickened bursal sac.  We went ahead and reaspirated cutback about 5 cc of serosanguineous fluid. No purulence. No gouty tissue.  Recommend 5 days of rest, ice, compression, work recheck.     Allergies: 1)  ! * Latex 2)  ! * Contrast Dye 3)  ! * Shellfish   Impression & Recommendations:  Problem # 1:  BURSITIS, LEFT ELBOW (ICD-726.33) Assessment Improved  The  elbow was prepped with alcohol and anesthetized with ethyl chloride. 5 cc of aspirate, rated fluid, serosanguineous was removed  Orders: Est. Patient Level III (81191) Joint Aspirate / Injection, Intermediate (47829)  Patient Instructions: 1)  You have received an injection of cortisone today. You may experience increased pain at the injection site. Apply ice pack to the area for 20 minutes every 2 hours and take 2 xtra strength tylenol every 8 hours. This increased pain will usually resolve in 24 hours. The injection will take effect in 3-10 days.  2)  OOW x 5 days return in 5 days  3)  apply ice three times a day x 5 days    Orders Added: 1)  Est. Patient Level III [56213] 2)  Joint Aspirate / Injection, Intermediate [20605]

## 2010-09-08 NOTE — Progress Notes (Signed)
  Phone Note From Other Clinic   Caller: Nurse Call For: chest pain Summary of Call: S:  pt seen at [facility] today for chest pressure, dizziness, and arm heaviness B:  pt was sent home, called our office to see if he could be seen today A:     I stated  pt needed to go to ED for complete evaluation R:  No working phone number, called his job, left message for him to call the office, urgently Initial call taken by: Teressa Lower RN,  November 19, 2009 8:54 AM  Follow-up for Phone Call        A: pt called back:      chest pain for 2 weeks      today feels like 200lbs,sitting on chest, pt has a      ICD 11-21-07,last echo 02-25-08, cath 10-26-07 EF 35-40% R:  Sent pt to ED Follow-up by: Teressa Lower RN,  November 19, 2009 9:34 AM  Additional Follow-up for Phone Call Additional follow up Details #1::        Additional background is necessary.  Patient has no coronary disease and therefore is at low risk for myocardial ischemia.  We needed to know what occurred at his assessment by [facility].  He does not often complain of chest discomfort.  Although it's never medically wrong to send the patient to the emergency department, we may have been able to handle this in the office.

## 2010-09-08 NOTE — Letter (Signed)
Summary: Out of Work  [facility]  [address]  [place], [place] [address]   Phone: [phone]  Fax: [phone]    June 01, 2010   Employee:  MINORU CHAP    To Whom It May Concern:    For Medical reasons, please excuse the above named employee, who has been seen for an appointment in our office today, from work for the following dates:  Start:   06/01/10  End/Return to work:   06/01/10  * Please excuse:  Out of work:    06/12/10 through 06/16/10, secondary to surgery    If you need additional information, please feel free to contact our office.         Sincerely,    Terrance Mass, MD

## 2010-09-08 NOTE — Miscellaneous (Signed)
Summary: labs cbcd,cmp,magnesium,bnp,06/30/2009  Clinical Lists Changes  Observations: Added new observation of MAGNESIUM: 1.9 mg/dL (04/54/0981 19:14) Added new observation of CALCIUM: 9.6 mg/dL (78/29/5621 30:86) Added new observation of ALBUMIN: 4.9 g/dL (57/84/6962 95:28) Added new observation of PROTEIN, TOT: 6.9 g/dL (41/32/4401 02:72) Added new observation of SGPT (ALT): 26 units/L (06/30/2009 14:20) Added new observation of SGOT (AST): 18 units/L (06/30/2009 14:20) Added new observation of ALK PHOS: 118 units/L (06/30/2009 14:20) Added new observation of CREATININE: 1.34 mg/dL (53/66/4403 47:42) Added new observation of BUN: 19 mg/dL (59/56/3875 64:33) Added new observation of BG RANDOM: 103 mg/dL (29/51/8841 66:06) Added new observation of CO2 PLSM/SER: 23 meq/L (06/30/2009 14:20) Added new observation of CL SERUM: 100 meq/L (06/30/2009 14:20) Added new observation of K SERUM: 4.4 meq/L (06/30/2009 14:20) Added new observation of NA: 139 meq/L (06/30/2009 14:20) Added new observation of BNP: 7.2  (06/30/2009 14:20) Added new observation of ABSOLUTE BAS: 0.0 K/uL (06/30/2009 14:20) Added new observation of BASOPHIL %: 0 % (06/30/2009 14:20) Added new observation of EOS ABSLT: 0.1 K/uL (06/30/2009 14:20) Added new observation of % EOS AUTO: 1 % (06/30/2009 14:20) Added new observation of ABSOLUTE MON: 0.6 K/uL (06/30/2009 14:20) Added new observation of MONOCYTE %: 7 % (06/30/2009 14:20) Added new observation of ABS LYMPHOCY: 1.2 K/uL (06/30/2009 14:20) Added new observation of LYMPHS %: 15 % (06/30/2009 14:20) Added new observation of PLATELETK/UL: 143 K/uL (06/30/2009 14:20) Added new observation of RDW: 13.9 % (06/30/2009 14:20) Added new observation of MCHC RBC: 33.9 g/dL (30/16/0109 32:35) Added new observation of MCV: 94.8 fL (06/30/2009 14:20) Added new observation of HCT: 49.2 % (06/30/2009 14:20) Added new observation of HGB: 16.7 g/dL (57/32/2025 42:70) Added new  observation of RBC M/UL: 5.19 M/uL (06/30/2009 14:20) Added new observation of WBC COUNT: 8.0 10*3/microliter (06/30/2009 14:20)

## 2010-09-08 NOTE — Progress Notes (Signed)
Summary: DEFIB needing to be cut off and on for surgery  Phone Note Outgoing Call Call back at [phone] Dr. Sherren Kerns heart care   Summary of Call: called and left message regarding this pt having surgery on 06/12/10 [facility] 730am and needs defib. cut off and then back on after surgery, they are to call back and confirm this. Initial call taken by: Ether Griffins,  June 02, 2010 9:44 AM  Follow-up for Phone Call        [facility] [employer] medical rep Bonna Gains was called [phone] and I left him a message about surgery and the need for defib to be cut on and off for surgery, he will call back to verify  Follow-up by: Ether Griffins,  June 02, 2010 2:04 PM  Additional Follow-up for Phone Call Additional follow up Details #1::        I called and left another message for bryan, he said he had spoken with someone in our office yesterday to confim this, There was not a note in the chart, but it is confirmed. Additional Follow-up by: Ether Griffins,  June 04, 2010 10:32 AM

## 2010-09-08 NOTE — Letter (Signed)
Summary: Device-Delinquent Phone Journalist, newspaper, Main Office  [address]   [place], [place] [address]   Phone: [phone]  Fax: [phone]     October 20, 2009 MRN: [id number]   [facility] [address] [place]  [address]   Dear Mr. Vannest,  According to our records, you were scheduled for a device phone transmission on  September 22, 2009.     We did not receive any results from this check.  If you transmitted on your scheduled day, please call us to help troubleshoot your system.  If you forgot to send your transmission, please send one upon receipt of this letter.  Thank you,   Architectural technologist [facility]

## 2010-09-08 NOTE — Assessment & Plan Note (Signed)
Summary: LEFT ELBOW PAIN BRINGING FILM/REPORTS./BCBS/BSF   Vital Signs:  Patient profile:   50 year old male Height:      73 inches Weight:      211 pounds Pulse rate:   86 / minute Resp:     18 per minute  Vitals Entered By: Fuller Canada MD (May 20, 2010 2:28 PM)  Visit Type:  new patient Referring Provider:  Dr. Wende Crease Primary Provider:  Dr. Sherwood Gambler  CC:  left elbow.  History of Present Illness: I saw Mike Floyd in the office today for an initial visit.  He is a 50 years old man with the complaint of:  left ebow pain and swelling.  Xrays today from Dr. Tenna Delaine office, on disc.  Meds: Klor Con, Multivitamin, Gabapentin 300mg , Protonix 40mg , Flexeril 10mg , Lisinopril 20mg , Coreg 12.5mg , Glipizide er 25mg , Hydrocodone 10.5 as needed, digoxin 0.125, Allopurinol 300mg  daily.  This patient has a history of 1-1/2-2 days of pain and swelling over his LEFT elbow which became so severe he went to the doctor for treatment.  The doctor thought he may have a septic arthritis and he was sent for evaluation and treatment  He has pain swelling and redness over his LEFT elbow consistent with a LEFT elbow olecranon bursitis.  He denies any trauma or puncture wounds.  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  He did have some drainage from the area.  He has not had any other treatment.        Allergies: 1)  ! * Latex 2)  ! * Contrast Dye 3)  ! * Shellfish  Past History:  Past medical, surgical, family and social histories (including risk factors) reviewed, and no changes noted (except as noted below).  Past Medical History: Reviewed history from 03/17/2010 and no changes required. NonischemicCARDIOMYOPATHY (ICD-425.4)-onset 2003; normal coronary angiography in 07/2002;       ejection fraction 30-35% in 2005; ejection fraction 49% by MUGA  in 11/09 and by stress nuclear in 01/2010;       assessment of LV function by echocardiography not feasible due to the poor quality images       Automatic implantable cardiac defibrillator implanted in 11/2007 (St. Jude)       repeat cath in 10/2007-normal coronary arteries; ejection fraction of 35-40%; normal hemodynamics Wolff-Parkinson-White with PSVT-RFA performed at [facility] in 1999-Dr. Sampson Goon Tobacco abuse Chronic kidney disease-Stage II-III; creatinine of 1.8 in 3/09 and 1.3 and 08/2008;       admitted 7/09 with acute on chronic renal insufficiency that resolved Gastroesophageal reflux disease AODM-no insulin; one A1c of 5.5 and 05/2008 Abnormal hepatic profile with elevated SGOT and SGPT in 08/2008 Gout CARPAL TUNNEL SYNDROME (ICD-354.0) POLYCYTHEMIA (ICD-289.0) Degenerative joint disease: Chronic low back pain with prior lumbosacral spinal surgery; knee pain HX, FAMILY, KIDNEY DISEASE NEC (ICD-V18.69) FAMILY HISTORY OF ARTHRITIS (ICD-V17.7) FAMILY HISTORY OF DIABETES (ICD-V18.0) COLONIC POLYPS (ICD-211.3) DM (ICD-250.00) HEARING IMPAIRMENT (ICD-389.9) GERD (ICD-530.81)  Past Surgical History: Reviewed history from 06/27/2009 and no changes required. Appendectomy 1985 Lumbosacral spine fusion- 1995 Status post AICD placement in April 2009 by Dr. Graciela Husbands ( VR ICD model 1207, serial [id number]) Laparoscopic cholecystectomy-11/2008 heel spur both feet  Family History: Reviewed history from 12/09/2008 and no changes required. FH of Cancer:  Family History of Diabetes Family History Coronary Heart Disease male < 18  Family History of Arthritis Hx, family, kidney disease NEC  Social History: Reviewed history from 12/24/2008 and no changes required. Patient is married.  truck mechanic/welder/truck driver Tobacco Use - 1ppd Alcohol Use - no Drug Use - no caffeine used daily 8th grade ed  Review of Systems Constitutional:  Complains of fever, chills, and fatigue; denies weight  loss and weight gain. Cardiovascular:  Complains of chest pain; denies palpitations, fainting, and murmurs. Respiratory:  Complains of short of breath, wheezing, pain on inspiration, and snoring; denies couch, tightness, and snoring . Gastrointestinal:  Complains of nausea; denies heartburn, vomiting, diarrhea, constipation, and blood in your stools. Musculoskeletal:  Complains of joint pain, swelling, stiffness, redness, heat, and muscle pain; denies instability. Endocrine:  Complains of excessive thirst; denies exessive urination and heat or cold intolerance. Skin:  Complains of redness; denies changes in the skin, poor healing, rash, and itching.  The review of systems is negative for Genitourinary, Neurologic, Psychiatric, HEENT, Immunology, and Hemoatologic.  Physical Exam  Additional Exam:  GEN: well developed, well nourished, normal grooming and hygiene, no deformity and normal body habitus.   CDV: pulses are normal, no edema, no erythema. no tenderness  Lymph: normal lymph nodes   Skin: as noted below  NEURO: normal coordination, reflexes, sensation.   Psyche: awake, alert and oriented. Mood normal   LEFT elbow there is a significant amount of swelling and redness around the LEFT elbow including an olecranon bursa which appears to be either severely inflamed or infected.  There is an area of crusting perhaps from the drainage.  He has passive range of motion which is normal and not painful in the joint.  He is only tender over the elbow and the bursa.  The elbow is stable.  His motor exam is normal appear     Impression & Recommendations:  Problem # 1:  BURSITIS, LEFT ELBOW (ICD-726.33) Assessment New  recommend aspiration and injection of the LEFT olecranon bursa  Under sterile conditions after alcohol prep and ethyl chloride spray we aspirated clear to bloody fluid from the olecranon bursa approximately 5 cc.  We then injected a one-to-one mixture of Depo-Medrol and  lidocaine.  A compression dressing was placed  Patient will be started on antibiotics.  We'll see him in a week he should keep his Ace bandage on for 48 hours.  Orders: New Patient Level III (60454) Joint Aspirate / Injection, Intermediate (20605) Depo- Medrol 40mg  (J1030)  Medications Added to Medication List This Visit: 1)  Bactrim Ds 800-160 Mg Tabs (Sulfamethoxazole-trimethoprim) .Marland Kitchen.. 1 by mouth two times a day  Patient Instructions: 1)  You have received an injection of cortisone today. You may experience increased pain at the injection site. Apply ice pack to the area for 20 minutes every 2 hours and take 2 xtra strength tylenol every 8 hours. This increased pain will usually resolve in 24 hours. The injection will take effect in 3-10 days.  2)  Start antibiotic two times a day  3)  keep pressure off the elbow  4)  keep ace on 48 hrs  5)  take medication as ordered  6)  return to office 1 week  7)  stay out of work until [employer]  Prescriptions: BACTRIM DS 800-160 MG TABS (SULFAMETHOXAZOLE-TRIMETHOPRIM) 1 by mouth two times a day  #20 x 0   Entered and Authorized by:   Fuller Canada MD   Signed by:   Fuller Canada MD on 05/20/2010   Method used:   Print  then Give to Patient   RxID:   [id number]

## 2010-09-08 NOTE — Progress Notes (Signed)
**Note De-Identified [name] Obfuscation** Summary: RETURNING TAMMYS CALL  Phone Note Call from Patient Call back at [phone]   Caller: PT Reason for Call: Talk to Nurse Summary of Call: PER PT Mike Floyd CALLED AND WANTED HIM TO CALL BACK. Initial call taken by: Faythe Ghee,  February 17, 2010 1:50 PM    New/Updated Medications: DIGOXIN 0.125 MG TABS (DIGOXIN) take 1 tablet by mouth once daily DIGOXIN 0.125 MG TABS (DIGOXIN) take 1 tablet by mouth once daily Prescriptions: DIGOXIN 0.125 MG TABS (DIGOXIN) take 1 tablet by mouth once daily  #30 x 3   Entered by:   Larita Fife Danicia Terhaar LPN   Authorized by:   Laren Boom, MD, [facility]   Signed by:   Larita Fife Aviv Lengacher LPN on 64/40/3474   Method used:   Electronically to        [facility]. [address] [phone]* (retail)       [address] [place], [place]  [address]       Ph: [phone]       Fax: [phone]   RxID:   [id number] DIGOXIN 0.125 MG TABS (DIGOXIN) take 1 tablet by mouth once daily  #30 x 3   Entered by:   Larita Fife Vanden Fawaz LPN   Authorized by:   Laren Boom, MD, [facility]   Signed by:   Larita Fife Saben Donigan LPN on 27/01/2375   Method used:   Electronically to        [facility]  [address] (retail)       [address]       [place], [place]  [address]       Ph: [phone]       Fax: [phone]   RxID:   [id number]  [facility] is correct pharmacy, sent to [facility] by mistake. [facility] aware.

## 2010-09-08 NOTE — Assessment & Plan Note (Signed)
Summary: 8 MTH F/U PER CHECKOUT ON 06/27/09/TG   Visit Type:  Follow-up Primary Provider:  Dr. Sherwood Gambler   History of Present Illness: Mike Floyd returns to the office for continuing assessment and treatment of a nonischemic cardiomyopathy.  Since last visit, he has done quite well.  He continues to work full-time without significant cardiopulmonary symptoms.  Specifically, he denies chest discomfort, lightheadedness, syncope and dyspnea.  His depression related to the death of his wife has lifted; in fact, he is planning to marry a high school girlfriend with whom he has resumed a relationship.  He has .noted mild pedal edema.  Review of his list of medications indicates that Lasix, which he previously took at a dose of 60 mg q.d., is no longer included.  There is no indication that any of his physicians suggested diuretic be stopped.  Nonetheless, no significant problems appear to have resulted from the cessation of this medication.  Current Medications (verified): 1)  Multivitamins  Tabs (Multiple Vitamin) .... Once Daily 2)  Gabapentin 300 Mg Caps (Gabapentin) .... Three Times A Day 3)  Protonix 40 Mg Tbec (Pantoprazole Sodium) .... Once Daily 4)  Cyclobenzaprine Hcl 10 Mg Tabs (Cyclobenzaprine Hcl) .... Three Times A Day 5)  Hydrocodone-Acetaminophen 10-650 Mg Tabs (Hydrocodone-Acetaminophen) .... As Needed 6)  Lisinopril 20 Mg Tabs (Lisinopril) .... Take One Tablet By Mouth Daily 7)  Carvedilol 25 Mg Tabs (Carvedilol) .... Take One Tablet By Mouth Twice A Day 8)  Glipizide 2.5 Mg Xr24h-Tab (Glipizide) .... Once Daily 9)  Nitroglycerin 0.4 Mg Subl (Nitroglycerin) .... Place 1 Tablet Under Tongue As Directed 10)  Digoxin 0.125 Mg Tabs (Digoxin) .... Take 1 Tablet By Mouth Once Daily  Allergies (verified): 1)  ! * Latex 2)  ! * Contrast Dye 3)  ! * Shellfish  Past History:  PMH, FH, and Social History reviewed and updated.  Past Medical History: NonischemicCARDIOMYOPATHY  (ICD-425.4)-onset 2003; normal coronary angiography in 07/2002;       ejection fraction 30-35% in 2005; ejection fraction 49% by MUGA in 11/09 and by stress nuclear in 01/2010;       assessment of LV function by echocardiography not feasible due to the poor quality images       Automatic implantable cardiac defibrillator implanted in 11/2007 (St. Jude)       repeat cath in 10/2007-normal coronary arteries; ejection fraction of 35-40%; normal hemodynamics Wolff-Parkinson-White with PSVT-RFA performed at [facility] in 1999-Dr. Sampson Goon Tobacco abuse Chronic kidney disease-Stage II-III; creatinine of 1.8 in 3/09 and 1.3 and 08/2008;       admitted 7/09 with acute on chronic renal insufficiency that resolved Gastroesophageal reflux disease AODM-no insulin; one A1c of 5.5 and 05/2008 Abnormal hepatic profile with elevated SGOT and SGPT in 08/2008 Gout CARPAL TUNNEL SYNDROME (ICD-354.0) POLYCYTHEMIA (ICD-289.0) Degenerative joint disease: Chronic low back pain with prior lumbosacral spinal surgery; knee pain HX, FAMILY, KIDNEY DISEASE NEC (ICD-V18.69) FAMILY HISTORY OF ARTHRITIS (ICD-V17.7) FAMILY HISTORY OF DIABETES (ICD-V18.0) COLONIC POLYPS (ICD-211.3) DM (ICD-250.00) HEARING IMPAIRMENT (ICD-389.9) GERD (ICD-530.81)  Review of Systems       See history of present illness.  Vital Signs:  Patient profile:   50 year old male Weight:      222 pounds Pulse rate:   109 / minute BP sitting:   161 / 93  (right arm)  Vitals Entered By: Dreama Saa, CNA (March 17, 2010 2:43 PM)  Physical Exam  General:  Overweight; well developed; no acute distress:  Neck-No JVD; no carotid bruits: Lungs-No tachypnea, no rales; no rhonchi; no wheezes Cardiovascular-Tachycardic;normal PMI; normal S1 and S2; minimal basilar systolic murmur; prominent S4 Abdomen-BS normal; soft and non-tender without masses or organomegaly:  Musculoskeletal-No deformities, no cyanosis or clubbing: Neurologic-Normal cranial  nerves; symmetric strength and tone:  Skin-Warm, no significant lesions; multiple tattoos Extremities-Nl distal pulses; 1/2+ edema:      ICD Specifications Following MD:  Lewayne Bunting, MD     ICD Vendor:  St Jude     ICD Model Number:  [phone]     ICD Serial Number:  [id number] ICD DOI:  11/21/2007     ICD Implanting MD:  Lewayne Bunting, MD  Lead 1:    Location: RV     DOI: 11/21/2007     Model #: [id number]     Serial #: [id number]     Status: active  Indications::  NICM   ICD Follow Up ICD Dependent:  No      Episodes Coumadin:  No  Brady Parameters Mode VVI     Lower Rate Limit:  40      Tachy Zones VF:  240     VT:  214     VT1:  190     Impression & Recommendations:  Problem # 1:  CHRONIC SYSTOLIC HEART FAILURE (ICD-428.22) CHF is well compensated with current medications.  The minimal degree of dependent edema that has developed is probably related to venous insufficiency, not persisting CHF.  Current medications will be continued.  Diuretic will be held.  Patient instructed to monitor weight and report a 5 pound gain.  Since he is not receiving diuretic, he probably does not need a potassium supplement, which will also be discontinued.  I will plan to reassess his nice gentleman in one year.  Other Orders: Future Orders: T-Basic Metabolic Panel [phone]) ... 04/17/2010  Patient Instructions: 1)  Your physician recommends that you schedule a follow-up appointment in: 1 year 2)  Your physician recommends that you return for lab work in:1 month 3)  Your physician has recommended you make the following change in your medication: stop potassium

## 2010-09-08 NOTE — Procedures (Signed)
Summary: ROV GO OVER MYOVIEW   Primary Provider:  Dr. Sherwood Floyd   History of Present Illness: [name] returns today for followup.  He is a pleasant middle aged man with a longstanding DCM, s/p ICD implant.  He has a h/o Catheter ablation over 10 yrs ago.  He has not had any ICD therapies.  He had been bothered by chest pain and he underwent exercise myoview testing which demonstrated preserved perfusion without ischemia but very poor exercise tolerance.  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  Current Medications (verified): 1)  Klor-Con M20 20 Meq Cr-Tabs (Potassium Chloride Crys Cr) .... 2 Tabs Once Daily 2)  Multivitamins  Tabs (Multiple Vitamin) .... Once Daily 3)  Gabapentin 300 Mg Caps (Gabapentin) .... Three Times A Day 4)  Protonix 40 Mg Tbec (Pantoprazole Sodium) .... Once Daily 5)  Cyclobenzaprine Hcl 10 Mg Tabs (Cyclobenzaprine Hcl) .... Three Times A Day 6)  Hydrocodone-Acetaminophen 10-650 Mg Tabs (Hydrocodone-Acetaminophen) .... As Needed 7)  Lisinopril 20 Mg Tabs (Lisinopril) .... Take One Tablet By Mouth Daily 8)  Carvedilol 25 Mg Tabs (Carvedilol) .... Take One Tablet By Mouth Twice A Day 9)  Glipizide 2.5 Mg Xr24h-Tab (Glipizide) .... Once Daily 10)  Nitroglycerin 0.4 Mg Subl (Nitroglycerin) .... Place 1 Tablet Under Tongue As Directed 11)  Digoxin 0.125 Mg Tabs (Digoxin) .... Take 1 Tablet By Mouth Once Daily  Allergies (verified): 1)  ! * Latex 2)  ! * Contrast Dye 3)  ! * Shellfish  Past History:  Past Medical History: Last updated: 06/27/2009 NonischemicCARDIOMYOPATHY (ICD-425.4)-onset 2003; normal coronary angiography in 07/2002;       ejection fraction 30-35% in 2005; ejection fraction 49% by MUGA in 11/09;        Automatic implantable cardiac defibrillator implanted in 11/2007 (St. Jude)       repeat cath in 10/2007-normal coronary arteries; ejection fraction of 35-40%; normal hemodynamics Wolff-Parkinson-White with PSVT-RFA  performed at [facility] in 1999-Mike Floyd Tobacco abuse Chronic kidney disease-Stage II-III; creatinine of 1.8 in 3/09 and 1.3 and 08/2008;       admitted 7/09 with acute on chronic renal insufficiency that resolved Gastroesophageal reflux disease AODM-no insulin; one A1c of 5.5 and 05/2008 Abnormal hepatic profile with elevated SGOT and SGPT in 08/2008 Gout CARPAL TUNNEL SYNDROME (ICD-354.0) POLYCYTHEMIA (ICD-289.0) Degenerative joint disease: Chronic low back pain with prior lumbosacral spinal surgery; knee pain HX, FAMILY, KIDNEY DISEASE NEC (ICD-V18.69) FAMILY HISTORY OF ARTHRITIS (ICD-V17.7) FAMILY HISTORY OF DIABETES (ICD-V18.0) COLONIC POLYPS (ICD-211.3) DM (ICD-250.00) HEARING IMPAIRMENT (ICD-389.9) GERD (ICD-530.81)  Past Surgical History: Last updated: 06/27/2009 Appendectomy 1985 Lumbosacral spine fusion- 1995 Status post AICD placement in April 2009 by Mike Floyd ( VR ICD model 1207, serial #[id number]) Laparoscopic cholecystectomy-11/2008 heel spur both feet  Review of Systems       The patient complains of dyspnea on exertion.  The patient denies chest pain, syncope, and peripheral edema.    Vital Signs:  Patient profile:   50 year old male Height:      73 inches Weight:      220 pounds BMI:     29.13 Pulse rate:   98 / minute Resp:     16 per minute BP sitting:   112 / 79  (left arm)  Vitals Entered By: Mike Floyd (February 24, 2010 11:02 AM)  Physical Exam  General:   General-Well developed; no acute distress:   Neck-No JVD; no carotid bruits: Lungs-No  tachypnea, no rales; no rhonchi; no wheezes: Well healed ICD incision. Cardiovascular-Tachycardic;normal PMI; normal S1 and S2; minimal basilar systolic murmur Abdomen-BS normal; soft and non-tender without masses or organomegaly:  Musculoskeletal-No deformities, no cyanosis or clubbing: Neurologic-Normal cranial nerves; symmetric strength and tone:  Skin-Warm, no significant lesions; multiple  tattoos Extremities-Nl distal pulses; no edema:      ICD Specifications Following Floyd:  Mike Floyd     ICD Vendor:  St Jude     ICD Model Number:  [id number]     ICD Serial Number:  [id number] ICD DOI:  11/21/2007     ICD Implanting Floyd:  Mike Floyd  Lead 1:    Location: RV     DOI: 11/21/2007     Model #: [id number]     Serial #: [id number]     Status: active  Indications::  NICM   ICD Follow Up Remote Check?  No Battery Voltage:  3.20 V     Charge Time:  10.8 seconds     Battery Est. Longevity:  7 years Underlying rhythm:  SR@86  ICD Dependent:  No       ICD Device Measurements Right Ventricle:  Amplitude: 10 mV, Impedance: 430 ohms, Threshold: 0.75 V at 0.5 msec Shock Impedance: 87 ohms   Episodes Coumadin:  No Shock:  0     ATP:  0     Nonsustained:  0      Brady Parameters Mode VVI     Lower Rate Limit:  40      Tachy Zones VF:  240     VT:  214     VT1:  190     Next Cardiology Appt Due:  05/09/2010 Tech Floyd:  No parameter changes.  Device function normal.   ROV 3 months RDS clinic. Mike Harm, Mike Floyd  February 24, 2010 11:23 AM  Mike Floyd:  Agree with above.  Impression & Recommendations:  Problem # 1:  OTHER CHEST PAIN (ICD-786.59) This appears to have resolved and his myoview study is without ischemia.  Will followup. His updated medication list for this problem includes:    Lisinopril 20 Mg Tabs (Lisinopril) .Marland Kitchen... Take one tablet by mouth daily    Carvedilol 25 Mg Tabs (Carvedilol) .Marland Kitchen... Take one tablet by mouth twice a day    Nitroglycerin 0.4 Mg Subl (Nitroglycerin) .Marland Kitchen... Place 1 tablet under tongue as directed  Problem # 2:  AUTOMATIC IMPLANTABLE CARDIAC DEFIBRILLATOR SITU (ICD-V45.02) His device is working normally.  Will recheck in several months.  Problem # 3:  CHRONIC SYSTOLIC HEART FAILURE (ICD-428.22) His symptoms are class 2.  I have strongly encourage him to increase his exercise activity by walking daily.  A low sodium diet is also recommended. His  updated medication list for this problem includes:    Lisinopril 20 Mg Tabs (Lisinopril) .Marland Kitchen... Take one tablet by mouth daily    Carvedilol 25 Mg Tabs (Carvedilol) .Marland Kitchen... Take one tablet by mouth twice a day    Nitroglycerin 0.4 Mg Subl (Nitroglycerin) .Marland Kitchen... Place 1 tablet under tongue as directed    Digoxin 0.125 Mg Tabs (Digoxin) .Marland Kitchen... Take 1 tablet by mouth once daily

## 2010-09-08 NOTE — Assessment & Plan Note (Signed)
Summary: 1 yr f/u per checkout on 12/25/08/tg   Primary Provider:  Dr. Sherwood Gambler   History of Present Illness: Mike Floyd returns today for followup.  He is a pleasant middle aged man with a longstanding DCM, s/p ICD implant.  He has a h/o Catheter ablation over 10 yrs ago.  He denies peripheral edema but he has been having chest pressure and sob.  He has not had any ICD therapies.   Current Medications (verified): 1)  Klor-Con M20 20 Meq Cr-Tabs (Potassium Chloride Crys Cr) .... 2 Tabs Once Daily 2)  Multivitamins  Tabs (Multiple Vitamin) .... Once Daily 3)  Gabapentin 300 Mg Caps (Gabapentin) .... Three Times A Day 4)  Protonix 40 Mg Tbec (Pantoprazole Sodium) .... Once Daily 5)  Cyclobenzaprine Hcl 10 Mg Tabs (Cyclobenzaprine Hcl) .... Three Times A Day 6)  Hydrocodone-Acetaminophen 10-650 Mg Tabs (Hydrocodone-Acetaminophen) .... As Needed 7)  Lisinopril 20 Mg Tabs (Lisinopril) .... Take One Tablet By Mouth Daily 8)  Carvedilol 25 Mg Tabs (Carvedilol) .... Take One Tablet By Mouth Twice A Day 9)  Glipizide 2.5 Mg Xr24h-Tab (Glipizide) .... Once Daily 10)  Nitroglycerin 0.4 Mg Subl (Nitroglycerin) .... Place 1 Tablet Under Tongue As Directed  Allergies (verified): 1)  ! * Latex 2)  ! * Contrast Dye 3)  ! * Shellfish  Past History:  Past Medical History: Last updated: 06/27/2009 NonischemicCARDIOMYOPATHY (ICD-425.4)-onset 2003; normal coronary angiography in 07/2002;       ejection fraction 30-35% in 2005; ejection fraction 49% by MUGA in 11/09;        Automatic implantable cardiac defibrillator implanted in 11/2007 (St. Jude)       repeat cath in 10/2007-normal coronary arteries; ejection fraction of 35-40%; normal hemodynamics Wolff-Parkinson-White with PSVT-RFA performed at [facility] in 1999-Dr. Sampson Goon Tobacco abuse Chronic kidney disease-Stage II-III; creatinine of 1.8 in 3/09 and 1.3 and 08/2008;       admitted 7/09 with acute on chronic renal insufficiency that  resolved Gastroesophageal reflux disease AODM-no insulin; one A1c of 5.5 and 05/2008 Abnormal hepatic profile with elevated SGOT and SGPT in 08/2008 Gout CARPAL TUNNEL SYNDROME (ICD-354.0) POLYCYTHEMIA (ICD-289.0) Degenerative joint disease: Chronic low back pain with prior lumbosacral spinal surgery; knee pain HX, FAMILY, KIDNEY DISEASE NEC (ICD-V18.69) FAMILY HISTORY OF ARTHRITIS (ICD-V17.7) FAMILY HISTORY OF DIABETES (ICD-V18.0) COLONIC POLYPS (ICD-211.3) DM (ICD-250.00) HEARING IMPAIRMENT (ICD-389.9) GERD (ICD-530.81)  Past Surgical History: Last updated: 06/27/2009 Appendectomy 1985 Lumbosacral spine fusion- 1995 Status post AICD placement in April 2009 by Dr. Graciela Husbands ( VR ICD model 1207, serial #[id number]) Laparoscopic cholecystectomy-11/2008 heel spur both feet  Review of Systems       The patient complains of chest pain.  The patient denies syncope, dyspnea on exertion, and peripheral edema.    Vital Signs:  Patient profile:   50 year old male Pulse rate:   86 / minute BP sitting:   133 / 82  (right arm)  Vitals Entered By: Dreama Saa, CNA (January 07, 2010 2:32 PM)  Physical Exam  General:   General-Well developed; no acute distress:   Neck-No JVD; no carotid bruits: Lungs-No tachypnea, no rales; no rhonchi; no wheezes: Cardiovascular-Tachycardic;normal PMI; normal S1 and S2; minimal basilar systolic murmur Abdomen-BS normal; soft and non-tender without masses or organomegaly:  Musculoskeletal-No deformities, no cyanosis or clubbing: Neurologic-Normal cranial nerves; symmetric strength and tone:  Skin-Warm, no significant lesions; multiple tattoos Extremities-Nl distal pulses; no edema:      ICD Specifications Following MD:  Lewayne Bunting, MD  ICD Vendor:  St Jude     ICD Model Number:  [id number]     ICD Serial Number:  [id number] ICD DOI:  11/21/2007     ICD Implanting MD:  Lewayne Bunting, MD  Lead 1:    Location: RV     DOI: 11/21/2007     Model #: 2536      Serial #: [id number]     Status: active  Indications::  NICM   ICD Follow Up Remote Check?  No Battery Voltage:  3.20 V     Charge Time:  10.8 seconds     Battery Est. Longevity:  7.1 years Underlying rhythm:  SR ICD Dependent:  No       ICD Device Measurements Right Ventricle:  Amplitude: 8.8 mV, Impedance: 400 ohms, Threshold: 1.0 V at 0.5 msec Shock Impedance: 77 ohms   Episodes Coumadin:  No Shock:  0     ATP:  0     Nonsustained:  0     Ventricular Pacing:  <1%  Brady Parameters Mode VVI     Lower Rate Limit:  40      Tachy Zones VF:  240     VT:  214     VT1:  190     Next Cardiology Appt Due:  04/09/2010 Tech Comments:  No parameter changes.  Device function normal.  ROV 3 months RDS clinic. Altha Harm, LPN  January 07, 2594 2:17 PM  MD Comments:  Agree with above.  Impression & Recommendations:  Problem # 1:  OTHER CHEST PAIN (ICD-786.59) the patient has had new onset of chest pressure over the past couple of weeks.  He is also having exertional dyspnea which has gotten worse.  Will plan a nuclear stress test.  His baseline ECG is abnormal.  His updated medication list for this problem includes:    Lisinopril 20 Mg Tabs (Lisinopril) .Marland Kitchen... Take one tablet by mouth daily    Carvedilol 25 Mg Tabs (Carvedilol) .Marland Kitchen... Take one tablet by mouth twice a day    Nitroglycerin 0.4 Mg Subl (Nitroglycerin) .Marland Kitchen... Place 1 tablet under tongue as directed  Orders: Nuclear Stress Test (Nuc Stress Test)  Problem # 2:  CARDIOMYOPATHY-NONISCHEMIC (ICD-425.4) His CHF symptoms remain class 2.  He will continue to have his ICD followed up in several months. The following medications were removed from the medication list:    Digoxin 0.125 Mg Tabs (Digoxin) .Marland Kitchen... Take one tablet by mouth daily His updated medication list for this problem includes:    Lisinopril 20 Mg Tabs (Lisinopril) .Marland Kitchen... Take one tablet by mouth daily    Carvedilol 25 Mg Tabs (Carvedilol) .Marland Kitchen... Take one tablet by mouth  twice a day    Nitroglycerin 0.4 Mg Subl (Nitroglycerin) .Marland Kitchen... Place 1 tablet under tongue as directed  Problem # 3:  TOBACCO ABUSE (ICD-305.1) I have again asked him to stop smoking.  Patient Instructions: 1)  Your physician recommends that you schedule a follow-up appointment in: with Gunnar Fusi in 3 months and post test with Dr. Ladona Ridgel 2)  Your physician has requested that you have an exercise stress myoview.  For further information please visit [email or web address]  Please follow instruction sheet, as given. 3)  Your physician recommends that you continue on your current medications as directed. Please refer to the Current Medication list given to you today.

## 2010-09-08 NOTE — Letter (Signed)
Summary: surgery order LT elbow sched 06/12/10  surgery order LT elbow sched 06/12/10   Imported By: Cammie Sickle 06/02/2010 11:20:31  _____________________________________________________________________  External Attachment:    Type:   Image     Comment:   External Document

## 2010-09-08 NOTE — Letter (Signed)
Summary: [place] Results Engineer, agricultural at [facility]  [address] [place] [address]   Phone: [phone]  Fax: [phone]      January 16, 2010 MRN: [id number]   [facility] [address], [place]  [address]   Dear Mr. Barret,  Your test ordered by Selena Batten has been reviewed by your physician (or physician assistant) and was found to be normal or stable. Your physician (or physician assistant) felt no changes were needed at this time.  ____ Echocardiogram  __X__ Cardiac Stress Test  ____ Lab Work  ____ Peripheral vascular study of arms, legs or neck  ____ CT scan or X-ray  ____ Lung or Breathing test  ____ Other: Please continue on current medical treatment.  Thank you.   Lewayne Bunting, MD, F.A.C.C

## 2010-09-08 NOTE — Miscellaneous (Signed)
Summary: nuclear study 01/09/2010  Clinical Lists Changes  Observations: Added new observation of NUCLEAR NOS:  Stress Data: Treadmill exercise performed to a workload of six mets   and a heart rate of 134, 78% of age predicted maximum.  Exercise   discontinued due to leg fatigue accompanied by moderate dyspnea and   slight chest tightness.  Oxygen saturation remained 95 - 96%   throughout.  Blood pressure increased from a resting value of   120/75 to 140/70 during exercise and 150/70 early in recovery, a   normal response.  No arrhythmias noted.    EKG: Normal sinus rhythm; nondiagnostic inferolateral Q-waves;   slight J point elevation; borderline IVCD.   Stress EKG:  No significant change.    Scintigraphic Data: Acquisition notable for mild to moderate   diaphragmatic attenuation.  Left ventricular size was at the upper   limit of normal.  On tomographic images reconstructed in standard   planes, there was a small mild inferoseptal defect as well as a   small mild apical defect.  Neither of these demonstrated   reversibility by comparison to the resting portion of the study.   The gated reconstruction revealed slight global hypokinesis with a   low normal ejection fraction of 48%.  There was normal systolic   accentuation of activity throughout.    IMPRESSION:   Negative stress nuclear myocardial study revealing impaired   exercise capacity, no significant stress-induced EKG abnormalities,   borderline left ventricular size and low normal left ventricular   systolic function.  By scintigraphic imaging, there was   diaphragmatic attenuation as well as a minimal apical perfusion   abnormality that likely represents exaggerated physiologic apical   thinning.  A small area of scarring at the apex cannot be   unequivocally excluded.  Other findings as noted.  (01/09/2010 14:26)      Nuclear Study  Procedure date:  01/09/2010  Findings:       Stress Data: Treadmill exercise  performed to a workload of six mets   and a heart rate of 134, 78% of age predicted maximum.  Exercise   discontinued due to leg fatigue accompanied by moderate dyspnea and   slight chest tightness.  Oxygen saturation remained 95 - 96%   throughout.  Blood pressure increased from a resting value of   120/75 to 140/70 during exercise and 150/70 early in recovery, a   normal response.  No arrhythmias noted.    EKG: Normal sinus rhythm; nondiagnostic inferolateral Q-waves;   slight J point elevation; borderline IVCD.   Stress EKG:  No significant change.    Scintigraphic Data: Acquisition notable for mild to moderate   diaphragmatic attenuation.  Left ventricular size was at the upper   limit of normal.  On tomographic images reconstructed in standard   planes, there was a small mild inferoseptal defect as well as a   small mild apical defect.  Neither of these demonstrated   reversibility by comparison to the resting portion of the study.   The gated reconstruction revealed slight global hypokinesis with a   low normal ejection fraction of 48%.  There was normal systolic   accentuation of activity throughout.    IMPRESSION:   Negative stress nuclear myocardial study revealing impaired   exercise capacity, no significant stress-induced EKG abnormalities,   borderline left ventricular size and low normal left ventricular   systolic function.  By scintigraphic imaging, there was   diaphragmatic attenuation as well as a  minimal apical perfusion   abnormality that likely represents exaggerated physiologic apical   thinning.  A small area of scarring at the apex cannot be   unequivocally excluded.  Other findings as noted.

## 2010-09-08 NOTE — Letter (Signed)
Summary: [place] Treadmill (Nuc Med Stress)  [facility]  [address] [place] [address]   Phone: [phone]  Fax: [phone]    Nuclear Medicine 1-Day Stress Test Information Sheet  Re:     Mike Floyd   DOB:     1961/07/13 MRN:     [id number] Weight:  Appointment Date: Register at: Appointment Time: Referring MD:  _X__Exercise Stress  __Adenosine   __Dobutamine  __Lexiscan  __Persantine   __Thallium  Urgency: ____1 (next day)   ____2 (one week)    ____3 (PRN)  Patient will receive Follow Up call with results: Patient needs follow-up appointment:  Instructions regarding medication:  How to prepare for your stress test: 1. DO NOT eat or dring 6 hours prior to your arrival time. This includes no caffeine (coffee, tea, sodas, chocolate) if you were instructed to take your medications, drink water with it. 2. DO NOT use any tobacco products for at leaset 8 hours prior to arrival. 3. DO NOT wear dresses or any clothing that may have metal clasps or buttons. 4. Wear short sleeve shirts, loose clothing, and comfortalbe walking shoes. 5. DO NOT use lotions, oils or powder on your chest before the test. 6. The test will take approximately 3-4 hours from the time you arrive until completion. 7. To register the day of the test, go to the Short Stay entrance at [facility]. 8. If you must cancel your test, call [phone] as soon as you are aware.  After you arrive for test:   When you arrive at [facility], you will go to Short Stay to be registered. They will then send you to Radiology to check in. The Nuclear Medicine Tech will get you and start an IV in your arm or hand. A small amount of a radioactive tracer will then be injected into your IV. This tracer will then have to circulate for 30-45 minutes. During this time you will wait in the waiting room and you will be able to drink something without caffeine. A series of pictures will be taken of  your heart follwoing this waiting period. After the 1st set of pictures you will go to the stress lab to get ready for your stress test. During the stress test, another small amount of a radioactive tracer will be injected through your IV. When the stress test is complete, there is a short rest period while your heart rate and blood pressure will be monitored. When this monitoring period is complete you will have another set of pictrues taken. (The same as the 1st set of pictures). These pictures are taken between 15 minutes and 1 hour after the stress test. The time depends on the type of stress test you had. Your doctor will inform you of your test results within 7 days after test.    The possibilities of certain changes are possible during the test. They include abnormal blood pressure and disorders of the heart. Side effects of persantine or adenosine can include flushing, chest pain, shortness of breath, stomach tightness, headache and light-headedness. These side effects usually do not last long and are self-resolving. Every effort will be made to keep you comfortable and to minimize complications by obtaining a medical history and by close observation during the test. Emergency equipment, medications, and trained personnel are available to deal with any unusual situation which may arise.  Please notify office at least 48 hours in advance if you are unable to keep  this appt.

## 2010-09-08 NOTE — Letter (Signed)
Summary: History form  History form   Imported By: Mike Floyd 05/22/2010 08:07:31  _____________________________________________________________________  External Attachment:    Type:   Image     Comment:   External Document

## 2010-09-08 NOTE — Letter (Signed)
Summary: Mike Floyd Lab Work Engineer, agricultural at [facility]  [address], [place] [address]   Phone: [phone]  Fax: [phone]     March 17, 2010 MRN: [id number]   [facility] [address] [place]  [address]      YOUR LAB WORK IS DUE   April 17, 2010  Please go to [facility], located across the street from [facility] on the second floor.  Hours are Monday - Friday 7am until 7:30pm         Saturday 8am until 12noon    __  DO NOT EAT OR DRINK AFTER MIDNIGHT EVENING PRIOR TO LABWORK  _x_ YOUR LABWORK IS NOT FASTING --YOU MAY EAT PRIOR TO LABWORK

## 2010-09-08 NOTE — Consult Note (Signed)
Summary: [facility]  [facility]   Imported By: Roderic Ovens 05/26/2010 14:52:22  _____________________________________________________________________  External Attachment:    Type:   Image     Comment:   External Document

## 2010-09-08 NOTE — Letter (Signed)
Summary: Device implant card  Device implant card   Imported By: Jacklynn Ganong 06/03/2010 11:13:29  _____________________________________________________________________  External Attachment:    Type:   Image     Comment:   External Document

## 2010-09-08 NOTE — Letter (Signed)
Summary: Patient medication discharge instructions  Patient medication discharge instructions   Imported By: Jacklynn Ganong 06/17/2010 11:48:15  _____________________________________________________________________  External Attachment:    Type:   Image     Comment:   External Document

## 2010-09-10 NOTE — Progress Notes (Signed)
Summary: Refill  Phone Note Call from Patient   Caller: Spouse Reason for Call: Refill Medication Summary of Call: pt needs refill on Digoxin 125mg  called to [facility]/tg Initial call taken by: Raechel Ache [facility],  August 28, 2010 1:28 PM    Prescriptions: DIGOXIN 0.125 MG TABS (DIGOXIN) take 1 tablet by mouth once daily  #30 x 7   Entered by:   Larita Fife Via LPN   Authorized by:   Kathlen Brunswick, MD, [facility]   Signed by:   Larita Fife Via LPN on 16/05/9603   Method used:   Electronically to        [facility] (retail)       [address]       [place], [place]  [address]       Ph: [phone]       Fax: [phone]   RxID:   [id number]

## 2010-10-19 ENCOUNTER — Encounter (INDEPENDENT_AMBULATORY_CARE_PROVIDER_SITE_OTHER): Payer: Self-pay | Admitting: *Deleted

## 2010-10-21 LAB — BASIC METABOLIC PANEL WITH GFR
BUN: 11 mg/dL (ref 6–23)
BUN: 10 mg/dL (ref 6–23)
CO2: 26 meq/L (ref 19–32)
CO2: 27 meq/L (ref 19–32)
Calcium: 9.4 mg/dL (ref 8.4–10.5)
Calcium: 9.5 mg/dL (ref 8.4–10.5)
Chloride: 103 meq/L (ref 96–112)
Chloride: 102 meq/L (ref 96–112)
Creatinine, Ser: 1.33 mg/dL (ref 0.4–1.5)
Creatinine, Ser: 1.47 mg/dL (ref 0.4–1.5)
GFR calc non Af Amer: 57 mL/min — ABNORMAL LOW
GFR calc non Af Amer: 51 mL/min — ABNORMAL LOW
Glucose, Bld: 138 mg/dL — ABNORMAL HIGH (ref 70–99)
Glucose, Bld: 94 mg/dL (ref 70–99)
Potassium: 4.1 meq/L (ref 3.5–5.1)
Potassium: 3.8 meq/L (ref 3.5–5.1)
Sodium: 137 meq/L (ref 135–145)
Sodium: 135 meq/L (ref 135–145)

## 2010-10-21 LAB — CBC
HCT: 43.4 % (ref 39.0–52.0)
HCT: 48.2 % (ref 39.0–52.0)
Hemoglobin: 15.1 g/dL (ref 13.0–17.0)
MCH: 32.3 pg (ref 26.0–34.0)
MCH: 32.6 pg (ref 26.0–34.0)
MCHC: 34.8 g/dL (ref 30.0–36.0)
MCHC: 34.9 g/dL (ref 30.0–36.0)
MCV: 92.9 fL (ref 78.0–100.0)
MCV: 93.5 fL (ref 78.0–100.0)
RDW: 14.1 % (ref 11.5–15.5)
RDW: 12.8 % (ref 11.5–15.5)
WBC: 8.5 10*3/uL (ref 4.0–10.5)

## 2010-10-21 LAB — DIGOXIN LEVEL: Digoxin Level: 0.4 ng/mL — ABNORMAL LOW (ref 0.8–2.0)

## 2010-10-21 LAB — POCT CARDIAC MARKERS: Myoglobin, poc: 63.4 ng/mL (ref 12–200)

## 2010-10-22 LAB — CBC
HCT: 46.3 % (ref 39.0–52.0)
HCT: 47 % (ref 39.0–52.0)
Hemoglobin: 17 g/dL (ref 13.0–17.0)
Hemoglobin: 17 g/dL (ref 13.0–17.0)
MCH: 33.3 pg (ref 26.0–34.0)
MCHC: 36.7 g/dL — ABNORMAL HIGH (ref 30.0–36.0)
MCV: 90.6 fL (ref 78.0–100.0)
Platelets: 131 K/uL — ABNORMAL LOW (ref 150–400)
RBC: 5.11 MIL/uL (ref 4.22–5.81)
RBC: 5.18 MIL/uL (ref 4.22–5.81)
RDW: 12.6 % (ref 11.5–15.5)
WBC: 6.5 10*3/uL (ref 4.0–10.5)
WBC: 6.8 10*3/uL (ref 4.0–10.5)

## 2010-10-22 LAB — URINALYSIS, ROUTINE W REFLEX MICROSCOPIC
Bilirubin Urine: NEGATIVE
Nitrite: NEGATIVE
Specific Gravity, Urine: 1.015 (ref 1.005–1.030)
pH: 5.5 (ref 5.0–8.0)

## 2010-10-22 LAB — COMPREHENSIVE METABOLIC PANEL WITH GFR
AST: 17 U/L (ref 0–37)
BUN: 12 mg/dL (ref 6–23)
CO2: 24 meq/L (ref 19–32)
Calcium: 9.2 mg/dL (ref 8.4–10.5)
Creatinine, Ser: 1.44 mg/dL (ref 0.4–1.5)
GFR calc Af Amer: >60 mL/min (ref >60)
GFR calc non Af Amer: 52 mL/min — ABNORMAL LOW (ref >60)
Total Bilirubin: 0.8 mg/dL (ref 0.3–1.2)

## 2010-10-22 LAB — CK TOTAL AND CKMB (NOT AT ARMC)
CK, MB: 5.9 ng/mL — ABNORMAL HIGH (ref 0.3–4.0)
Relative Index: INVALID (ref 0.0–2.5)
Total CK: 81 U/L (ref 7–232)
Total CK: 93 U/L (ref 7–232)

## 2010-10-22 LAB — DIFFERENTIAL
Basophils Absolute: 0.1 10*3/uL (ref 0.0–0.1)
Basophils Absolute: 0.1 K/uL (ref 0.0–0.1)
Basophils Relative: 1 % (ref 0–1)
Eosinophils Absolute: 0.1 10*3/uL (ref 0.0–0.7)
Eosinophils Relative: 2 % (ref 0–5)
Lymphocytes Relative: 18 % (ref 12–46)
Lymphocytes Relative: 21 % (ref 12–46)
Lymphs Abs: 1.5 K/uL (ref 0.7–4.0)
Monocytes Absolute: 0.5 10*3/uL (ref 0.1–1.0)
Monocytes Relative: 7 % (ref 3–12)
Neutro Abs: 4.6 10*3/uL (ref 1.7–7.7)
Neutro Abs: 4.7 K/uL (ref 1.7–7.7)
Neutrophils Relative %: 69 % (ref 43–77)
Neutrophils Relative %: 71 % (ref 43–77)

## 2010-10-22 LAB — POCT CARDIAC MARKERS
Myoglobin, poc: 61.3 ng/mL (ref 12–200)
Troponin i, poc: <0.05 ng/mL (ref 0.00–0.09)

## 2010-10-22 LAB — D-DIMER, QUANTITATIVE: D-Dimer, Quant: 0.32 ug/mL-FEU (ref 0.00–0.48)

## 2010-10-22 LAB — BASIC METABOLIC PANEL
GFR calc non Af Amer: >60 mL/min (ref >60)
Glucose, Bld: 98 mg/dL (ref 70–99)
Potassium: 4.6 mEq/L (ref 3.5–5.1)
Sodium: 138 mEq/L (ref 135–145)

## 2010-10-22 LAB — COMPREHENSIVE METABOLIC PANEL
ALT: 29 U/L (ref 0–53)
Albumin: 3.8 g/dL (ref 3.5–5.2)
Alkaline Phosphatase: 102 U/L (ref 39–117)
Chloride: 104 mEq/L (ref 96–112)
Glucose, Bld: 100 mg/dL — ABNORMAL HIGH (ref 70–99)
Potassium: 4.1 mEq/L (ref 3.5–5.1)
Sodium: 137 mEq/L (ref 135–145)
Total Protein: 6.1 g/dL (ref 6.0–8.3)

## 2010-10-22 LAB — DIGOXIN LEVEL
Digoxin Level: 0.3 ng/mL — ABNORMAL LOW (ref 0.8–2.0)
Digoxin Level: 0.6 ng/mL — ABNORMAL LOW (ref 0.8–2.0)

## 2010-10-22 LAB — PROTIME-INR
INR: 1.02 (ref 0.00–1.49)
Prothrombin Time: 13.6 s (ref 11.6–15.2)

## 2010-10-22 LAB — BRAIN NATRIURETIC PEPTIDE: Pro B Natriuretic peptide (BNP): 33 pg/mL (ref 0.0–100.0)

## 2010-10-22 LAB — APTT: aPTT: 29 s (ref 24–37)

## 2010-10-27 NOTE — Letter (Signed)
Summary: Unable to Reach, Consult Scheduled  [facility]  [address]   [place], [place] [address]   Phone: [phone]  Fax: [phone]    10/19/2010  [facility] [address] [place], [place]  [address] [birth year]   Dear Mr. Pla,   We have been unable to reach you by phone.  Please contact our office with an updated phone number.  At the recommendation of _________________  we have been asked to schedule you a consult with ___________________ for ________________.  Your appointment is scheduled for ___________________. If you are unable to keep this appointment, or if you have any questions,  please call our office at [phone].     Thank you,    Diana Eves  [facility] R. Roetta Sessions, M.D.    Jonette Eva, M.D. Lorenza Burton, FNP-BC    Tana Coast, PA-C Phone: [phone]    Fax: [phone]

## 2010-10-27 NOTE — Letter (Signed)
Summary: Unable to Reach, Consult Scheduled  [facility]  [address]   [place], [place] [address]   Phone: [phone]  Fax: [phone]    10/19/2010  [facility] [address] [place], [place]  [address] 1960/10/08   Dear Mr. Dado,   We have been unable to reach you by phone.    At the recommendation of    [facility]  we have been asked to schedule you a consult with DR FIELDS for DYSPHAGIA/DIFFICULTY SWALLOWING.  Please call our office at [phone].     Thank you,    Diana Eves  [facility] R. Roetta Sessions, M.D.    Jonette Eva, M.D. Lorenza Burton, FNP-BC    Tana Coast, PA-C Phone: [phone]    Fax: [phone]

## 2010-12-22 NOTE — Op Note (Signed)
NAMEMIRIAM, KESTLER                 ACCOUNT NO.:  [id number]   MEDICAL RECORD NO.:  [id number]          PATIENT TYPE:  AMB   LOCATION:  DAY                           FACILITY:  [facility]   PHYSICIAN:  Kassie Mends, M.D.      DATE OF BIRTH:  [birth year]   DATE OF PROCEDURE:  08/27/2008  DATE OF DISCHARGE:                               OPERATIVE REPORT   REFERRING PHYSICIAN:  Madelin Rear. Sherwood Gambler, MD   PROCEDURE:  1. Ileocolonoscopy with cold forceps polypectomy and random cold      forceps biopsy.  2. Esophagogastroduodenoscopy with cold forceps biopsies of the      gastric and duodenal mucosa.   INDICATION FOR EXAMINATION:  Mike Floyd is a 50 year old male who presents  with history of gastroesophageal reflux disease.  He also complains of  solid dysphagia.  He complains of having diarrhea for 3 months.  He has  5-6 loose bowel movements a day.  He did maintain as an outpatient on  Klor-Con and nabumetone, as well as Protonix 40 mg daily.  His BMI is  32.3 (obese).   FINDINGS:  1. Normal terminal ileum approximately 10 cm visualized.  2. Rare sigmoid colon diverticula.  A 4-mm sessile transverse colon      polyp removed via cold forceps.  A 4-mm sessile descending colon      polyp removed via cold forceps.  Otherwise, no masses, inflammatory      changes, or AVMs seen.  3. Normal retroflexed view of the rectum.  4. Multiple linear erosions seen at the GE junction with overlying      exudate.  Otherwise, no evidence of Barrett mass or stricture.  5. Patchy erythema in the gastric mucosa, in the antrum (antrum).      Biopsies obtained via cold forceps to evaluate for H. pylori      gastritis.  6. Normal-appearing duodenal bulb and second portion of the duodenum      with moderate bile staining.  Biopsies obtained via cold forceps to      evaluate for celiac sprue as an etiology for diarrhea.   DIAGNOSES:  1. Colon polyps.  2. Mild sigmoid colon diverticulosis.  3. Esophagitis, grade B  likely contrinuting to dysphagia.  4. Mild gastritis.   RECOMMENDATIONS:  1. Mr. Fjeld should lose 10-20 pounds.  2. He is to increase his Protonix to 40 mg 30 minutes prior to his      first and his last meal.  3. We will call him with results of his biopsies.  If he has a simple      adenoma, then he need a screening colonoscopy in 10 years.  4. No aspirin, NSAIDs, or anticoagulation for 7 days.  5. He should follow a high-fiber, low-fat diabetic diet.  He is given      a handout on polyps, diverticulosis, reflux, and gastritis.  6. He already has a followup appointment to see me in 2 months.   MEDICATIONS:  1. Demerol 125 mg IV.  2. Versed 8 mg IV.   PROCEDURE TECHNIQUE:  Physical  exam was performed.  Informed consent was  obtained from the patient after explaining the benefits, risks, and  alternatives to the procedure.  The patient was connected to the monitor  and placed in left lateral position.  Continuous oxygen was provided by  nasal cannula and IV medicine administered through an indwelling  cannula.  After ministration of sedation and rectal exam, the patient's  rectum was intubated and scope was advanced under direct visualization  to the distal terminal ileum.  The scope was removed slowly by carefully  examining the color, texture, anatomy, and integrity of the mucosa on  the way out.  The patient was recovered.   After the colonoscopy, the patient's esophagus was intubated with a  diagnostic gastroscope and the gastroscope was advanced under direct  visualization to the second portion of the duodenum.  The scope was  removed slowly by carefully examining the color, texture, anatomy, and  integrity of the mucosa on the way out.  The patient was recovered in  endoscopy and discharged home in satisfactory condition.   PATH:  Benign gastric and duodenal mucosa. Simple adenomas. Lymphocytic  colitis. Stop nabumetone. Pepto Bismol for 8 weeks.      Kassie Mends,  M.D.  Electronically Signed     SM/MEDQ  D:  08/27/2008  T:  08/27/2008  Job:  [id number]   cc:   Madelin Rear. Sherwood Gambler, MD  Fax: [phone]

## 2010-12-22 NOTE — Op Note (Signed)
Mike Floyd, CREASY                 ACCOUNT NO.:  [id number]   MEDICAL RECORD NO.:  [id number]          PATIENT TYPE:  INP   LOCATION:  3735                         FACILITY:  [facility]   PHYSICIAN:  Duke Salvia, MD, FACCDATE OF BIRTH:  September 15, 1960   DATE OF PROCEDURE:  11/21/2007  DATE OF DISCHARGE:                               OPERATIVE REPORT   PREOPERATIVE DIAGNOSIS:  Ischemic cardiomyopathy.   POSTOPERATIVE DIAGNOSIS:  Ischemic cardiomyopathy.   PROCEDURE:  Single-chamber defibrillator implantation with  intraoperative defibrillation threshold testing.   DESCRIPTION OF PROCEDURE:  After obtaining informed consent, [name]  was brought to electrophysiology laboratory and placed on the  fluoroscopic table in the supine position.  After routine prep and drape  of the left upper chest, lidocaine was infiltrated in the prepectoral  subclavicular region.  An incision was made and carried down to the  level of the prepectoral fascia with electrocautery and sharp  dissection.  A pocket was performed similarly.  Hemostasis was obtained.   Thereafter attention was turned gain access to the extrathoracic left  subclavian vein which was accomplished without difficulty, without the  aspiration of air or puncture of the artery.  An 8-French sheath was  placed which was then passed a Gerota 7122 65 cm single coil active  fixation and the defibrillator lead, serial #[id number] and under  fluoroscopic guidance it was moved to the right ventricular apex where  the bipolar R-wave is 13.3 with a pace impedance of 10.17 and threshold  1 V at 0.5 milliseconds. The current of injury is brisk, and there is no  diaphragmatic pacing at 10 V.  Leads were secured in prepectoral fascia  and radiographically a lot of the slack was at the junction of the  innominate and the superior vena cava.  The lead was then attached with  current VR ICD model 1207, serial [id number] and through the device of  bipolar  R-wave 11.3 with a pace impedance of 40 ohms of threshold 0.5 V  in 0.5 milliseconds and the high-voltage impedance was 73 ohms.   At this junction, the pocket was copiously irrigated with antibiotic-  containing saline solution.  Hemostasis was reassured.  The lead and the  pulse generator were placed in the pocket and secured in prepectoral  fascia.   Defibrillation threshold testing was undertaken.  Ventricular  defibrillation was induced via the T-wave shock.  After total duration  of 6 seconds, a 15 joule shock was delivered to measured resistance of  69 ohms failed in determinate ventricular defibrillation.   After total duration of 14 seconds, a 25 joule shock was delivered to  measure resistance of 69 ohms to determinate ventricular defibrillation  and restoring sinus rhythm.   After a waited 5 to 6 minutes, ventricular fibrillation was induced  again via the T-wave shock.  After total duration of 8 seconds, a 25  joule shock was delivered to measured resistance of 67 ohms to  determinate ventricular defibrillation restoring sinus rhythm.  This  device was implanted.  The wound was closed in 3 layers in the  normal  fashion.  The wound was washed, dried, and a Benzoin and Steri-Strip  dressing was applied.  Needle count, sponge count, and instruments  counts were correct at the end of procedure according to the staff.   The patient tolerated the procedure without apparent complication.      Duke Salvia, MD, [facility]  Electronically Signed     SCK/MEDQ  D:  11/21/2007  T:  11/22/2007  Job:  [id number]

## 2010-12-22 NOTE — Assessment & Plan Note (Signed)
[facility]                         ELECTROPHYSIOLOGY OFFICE NOTE   NAME:Bourke, DAVONTAY WATLINGTON                        MRN:          [id number]  DATE:12/25/2008                            DOB:          [birth year]    Mr. Torrance returns today for followup.  He is a middle-aged male with a  history of nonischemic cardiomyopathy and congestive heart failure who  is status post ICD insertion.  The patient also has a history of remote  SVT and status post ablation.  He returns today for followup.  He had no  specific complaints today, denying chest pain or shortness of breath.  He is back working stating sometimes he works up to 50-60 hours per  week.   CURRENT MEDICATIONS:  1. Glipizide ER 2.5 mg daily.  2. Coreg 12.5 twice daily.  3. Lisinopril 10 mg daily.  4. Potassium 20 mEq twice daily.  5. Multivitamin daily.  6. Furosemide 60 mg daily.  7. Gabapentin 300 three times a day.  8. Protonix 40 mg daily.  9. Cyclobenzaprine 10 mg t.i.d.   PHYSICAL EXAMINATION:  GENERAL:  He is a pleasant middle-aged man in no  distress.  VITAL SIGNS:  Blood pressure was 120/84, the pulse 66 and regular, the  respirations were 18, and weight was 234 pounds.  NECK:  No jugular venous distention.  LUNGS:  Clear bilaterally to auscultation.  No wheezes, rales or rhonchi  were present.  CARDIOVASCULAR:  Regular rate and rhythm.  Normal S1 and S2.  PMI is  enlarged and laterally displaced.  ABDOMEN:  Soft, nontender.  EXTREMITIES:  No cyanosis, clubbing or edema.  The pulses 2+ and  symmetric.  NEUROLOGIC:  Alert and oriented x3.  Cranial nerves intact.   Interrogation of his defibrillator demonstrates a [employer] Jude current VR RF  device.  The pacing threshold in the V was 0.5 at 0.5.  The R-waves were  9, the impedance were 40, the high-voltage impedance was 82 ohms.   IMPRESSION:  1. Ischemic cardiomyopathy.  2. Congestive heart failure.  3. Status post prophylactic  implantable cardioverter-defibrillator      insertion.   DISCUSSION:  Overall, Ms. Hoyt is stable.  His defibrillator is working  normally.  Today, we refill his medications and we will plan to see the  patient back for implantable cardioverter-defibrillator followup in 1  year.     Doylene Canning. Ladona Ridgel, MD  Electronically Signed    GWT/MedQ  DD: 12/25/2008  DT: 12/26/2008  Job #: [id number]   cc:   Madelin Rear. Sherwood Gambler, MD

## 2010-12-22 NOTE — Assessment & Plan Note (Signed)
[facility]                            CARDIOLOGY OFFICE NOTE   NAME:Benyo, DORMAN CALDERWOOD                        MRN:          [id number]  DATE:11/02/2007                            DOB:          10/05/1960    Mr. Forand seen today as a new patient by me.  He is a previous patient of  Dr. Dorethea Clan.  He lives in [place] and has a primary care, Dr. Sherwood Gambler,  in [place].  He was recently admitted to the hospital by Dr. Antoine Poche  for chest pain cardiomyopathy.   The patient had heart catheterization by Dr. Excell Seltzer.  He had normal  coronary arteries with moderate global hypokinesis, an EF of 30-35%.  His right heart pressures were reviewed.  There were actually quite low.   His PA pressure was 21/8.  The right atrial pressure was 4.  Mean  pulmonary capillary wedge pressure was 13.   In talking to the patient, he has felt fairly well.  He has had some  abdominal bloating.  There is some mild exertional dyspnea.  However, it  became apparent on exam that his heart rate was quite high.  It was 120-  122.   In retrospect, he may have felt some palpitations.   He is not having significant chest pain, PND, orthopnea.  There is been  no lower extremity edema.   I had to review his chart extensively.   He has had a previous ablation by Dr. Sampson Goon at [facility].   At that time he had orthodromic supraventricular tachycardia using a  concealed posterior lateral pathway located in the middle cardiac vein.  He had this ablated.  He also had retrograde slow AV nodal pathway, with  ablation of this as well.   While in the hospital I tried to review his chart.  A comment is made of  heart rates anywhere from 80-100 but certainly not this high.   His past medical history is otherwise remarkable for previous ablation,  previous lumbar surgery, appendectomy, history of polycythemia, tobacco  abuse and nonischemic cardiomyopathy.   His he has no known  allergies.   His meds include nabumetone  750 b.i.d., cyclobenzaprine 10 t.i.d.,  metoprolol 100 a day.  His last dose was yesterday.  He did not have a  dose today.  Protonix 40 a day, an aspirin a day, Gabapentin 300 t.i.d.,  furosemide 40 a day, metolazone 5 mg a day, Klor-Con 20 t.i.d.,  lisinopril 5 a day, multivitamins.   His exam is remarkable for a weight of to the 32, blood pressure 130/80,  pulse 121, afebrile, respiratory 14.  HEENT:  Unremarkable.  Carotids normal without bruit, no lymphadenopathy, thyromegaly.  JVP is  not elevated.  LUNGS:  Clear with diaphragmatic motion.  No wheezing.  There is an S1,  S2 and there may be a faint summation gallop.  There is no obvious  murmur.  PMI is increased.  ABDOMEN:  Benign.  Bowel sounds positive.  No AAA.  No tenderness, no  ascites.  No hepatosplenomegaly or hepatojugular reflux.  Pulses intact,  no edema.  NEURO:  Nonfocal.  SKIN:  Warm and dry with multiple tattoos on his arms.  NEURO:  Nonfocal.   His EKG appeared to show sinus tach.  He had upright P-waves in II, III  and F and a biphasic P-wave in V1.  He was hooked up to a rhythm strip.  Despite a fairly aggressive Valsalva maneuver, bilateral carotid sinus  massage and pronounced inspiratory variation, we are unable to get the  heart rate to budge from 121 to 122.  This lack of variation was  concerning for possible high right atrial tachycardia.   IMPRESSION:  1. Question abnormal tachycardia, history of AV nodal reentrant      tachycardia with concealed pathway and ablation.  I had Dr. Graciela Husbands      see the patient after talking to him and looking into his strips.      He also agreed.  He recommended doing a 24-hour Holter monitor.      This will look for normal circadian variant variation and also see      if there is a normal drop in heart rate at night time.  He will      then see Dr. Graciela Husbands next week.  I took the liberty in the meantime      of increasing his  Lopressor to 100 b.i.d.  2. Cardiomyopathy, clearly nonischemic given recent cath with good      filling pressures.  There is a question in my mind now whether it      is tachycardia mediated.  Will have to reassess once his rhythm is      understood better.  3. Hypertension, currently well controlled.  Continue lisinopril 5 mg      a day, particularly in light of decreased left ventricular      function.  4. History of reflux.  Continue Protonix 40 mg a day, low-spice diet.      Avoid late-night meals.  5. Neuropathy with pain.  Continue nabumetone and cyclobenzaprine.      Follow up with a neurologist p.r.n.  6. Question history of polycythemia in the setting of tobacco abuse.      The patient quit smoking 2 years ago.  Will need a follow-up red      blood cell mass in the near future.   The patient will follow up with Dr. Graciela Husbands next week.  I would like to  follow him up in regard to his heart failure up in [place] office.   Again, we spent quite a bit of time, over an hour, with the patient in  the office today trying to figure out exactly what his rhythm was and  the patient and his wife were very grateful in regard to the attention  he got today.     Noralyn Pick. Eden Emms, MD, [facility]  Electronically Signed    PCN/MedQ  DD: 11/02/2007  DT: 11/03/2007  Job #: [id number]   cc:   Madelin Rear. Sherwood Gambler, MD

## 2010-12-22 NOTE — Letter (Signed)
October 30, 2008    Madelin Rear. Sherwood Gambler, MD  [address]  [place], [place] [address]   RE:  Mike Floyd, Mike Floyd  MRN:  [id number]  /  DOB:  1961/05/08   Dear [name]:   Mr. Haydel returns to the office for continued assessment and treatment of  nonischemic cardiomyopathy.  Since his last visit, he has done generally  well.  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  He tolerates the warm weather and cold weather fairly well.  Occasionally, he is completely exhausted after work, but recovers within  a day or so.  He pursues no aerobic exercise.  He rarely experiences  dyspnea.  He has occasional atypical chest discomfort.  He has no  orthopnea or PND.  He has not noted ankle edema.   He has had chronic kidney disease that has been progressively improving.  Creatinine of 1.8 in March 2009, fell to 1.3 in January 2010.  He has  diabetes, but A1c has been excellent.   Mr. Malizia has experienced some orthostatic dizziness.  This typically  last for seconds to as long as 1 minute.  There have been no falls nor  loss of consciousness.  He also experiences leg cramps, primarily in the  thighs.  These also resolve spontaneously after a few minutes.   MEDICATIONS:  Unchanged from his last visit except for decrease in  potassium to 20 mEq b.i.d.   PHYSICAL EXAMINATION:  GENERAL:  A pleasant well-appearing gentleman.  VITAL SIGNS:  The weight is 235, 8 pounds less than 4 months ago.  Blood  pressure 100/70 without orthostatic change, heart rate 84 and regular,  and respirations 12 and unlabored.  NECK:  No jugular venous distention; no carotid bruits.  LUNGS:  Clear.  CARDIAC:  Normal first and second heart sounds; basilar systolic  ejection murmur.  ABDOMEN:  Soft and nontender; no organomegaly.  EXTREMITIES:  No edema.   Gated radionuclide left ventriculogram was performed.  Estimated  ejection fraction was 49%.   LABORATORY STUDIES:  A normal CBC, a normal basic  metabolic profile with  creatinine of 1.29 and BUN of 21, but mildly no abnormal LFTs with an  SGOT of 39 and an SGPT of 106.  TSH was also normal.   Rhythm strip:  Sinus tachycardia; normal PR and QRS intervals.   IMPRESSION:  Mr. Cauthon is doing extremely well with substantial recovery  of left ventricular systolic function after optimization of medical  therapy.  I do not anticipate a decompensation of congestive heart  failure or electrical instability in the near or intermediate term.  He  may wish to pursue evaluation of his minor transaminase elevations.  Otherwise, I will recheck these when I reassess his chemistry profile in  a few months.  Mr. Granieri will return for an office visit in 8 months.    Sincerely,      Gerrit Friends. Dietrich Pates, MD, [facility]  Electronically Signed    RMR/MedQ  DD: 10/30/2008  DT: 10/31/2008  Job #: [id number]

## 2010-12-22 NOTE — H&P (Signed)
NAMEMARVIE, CALENDER                 ACCOUNT NO.:  [id number]   MEDICAL RECORD NO.:  [id number]          PATIENT TYPE:  AMB   LOCATION:  DAY                           FACILITY:  [facility]   PHYSICIAN:  Tilford Pillar, MD      DATE OF BIRTH:  1960/12/16   DATE OF ADMISSION:  DATE OF DISCHARGE:  LH                              HISTORY & PHYSICAL   CHIEF COMPLAINT:  Bloating, abdominal pain, and abnormal abdominal  studies.   HISTORY OF PRESENT ILLNESS:  The patient is a 50 year old male who  presented to his primary care physician with a history of chest pain and  bloating.  He had actually been evaluated for potential cardiac  etiology.  He was evaluated by Dr. Graciela Husbands who recommended placement of a  pacemaker defibrillator which he recently had placed several weeks ago  with uneventful placement.  Some of his symptomatology actually did  improve, but the patient continues to have a significant amount of  abdominal bloating and nausea.  He has denied any emesis.  He has had a  significant amount of flatus.  He has occasional epigastric and right  upper quadrant abdominal pain, but no exacerbating or relieving  features.  He has not noticed any changes with fatty or greasy foods.  He has no change in bowel movements.  No melena.  No hematochezia.  No  acholic stools.  He has had no episodes of jaundice.  He does have a  history of a gallbladder disease within the family with the mother who  has had a recent gallbladder attack.   PAST MEDICAL HISTORY:  1. Chronic back pain.  2. Cardiac arrhythmias requiring a pacemaker defibrillator placement,      he has been tolerating.  3. Borderline diabetic.  4. Hypertension.  5. Gastroesophageal reflux disease.  6. Peripheral neuropathy.   PAST SURGICAL HISTORY:  1. He has had a recent pacemaker placement for an ejection fraction of      30%.  2. He has had prior back surgery.  3. He has had a heart catheterization approximately 1 month ago.   MEDICATIONS:  1. Protonix 40 mg p.o. daily.  2. Cyclobenzaprine.  3. Metoprolol.  4. Aspirin.  5. Catapres.  6. Furosemide.  7. Levaquin.   ALLERGIES:  No known drug allergies.   SOCIAL HISTORY:  He is not currently a tobacco smoker; however, he last  smoked approximately 2 years ago.  Alcohol, he denies.  No recreational  drugs.  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   PERTINENT FAMILY HISTORY:  Coronary artery disease, diabetes mellitus,  and biliary disease.   REVIEW OF SYSTEMS:  CONSTITUTIONAL:  Unremarkable.  EYES:  Unremarkable.  EARS, NOSE, AND THROAT:  Unremarkable.  RESPIRATORY:  Unremarkable.  CARDIOVASCULAR:  Chest pain as per HPI.  This has not recurred since the  placement of his pacemaker/defibrillator.  GASTROINTESTINAL:  Positive  abdominal pain.  Nausea as per HPI.  Positive indigestion.  GENITOURINARY:  Unremarkable.  MUSCULOSKELETAL:  Arthralgias into the  neck and back.  SKIN:  Unremarkable.  ENDOCRINE:  He has complaints of  cold and heat intolerance and no energy.  NEUROLOGIC:  Unremarkable.   PHYSICAL EXAMINATION:  VITAL SIGNS:  Reviewed.  GENERAL:  The patient is a somewhat disheveled male in no acute  distress.  He is alert and oriented x3.  His wife is present at the time  of evaluation.  HEENT:  Scalp:  No deformities, no masses.  Eyes:  Pupils equal, round,  and reactive.  Extraocular movements are intact.  No Scleral icterus or  conjunctival pallor.  He does have diminished hearing on the left side  on evaluation and is somewhat hard of hearing.  Oral mucosa is pink and  moist.  No malocclusion.  NECK:  Trachea is midline.  No thyroid nodules or goiter is appreciated.  No cervical lymphadenopathy is apparent.  PULMONARY:  Unlabored respiration.  No wheezes.  No crackles.  He is  clear to auscultation bilaterally.  CARDIOVASCULAR:  Regular rate and rhythm.  No murmurs.  No gallops.  He  has 2+ radial pulses  bilaterally.  He has an incision on the left chest  consistent with his recently placed pacemaker.  ABDOMEN:  Bowel sounds are present.  Abdomen is soft.  He has mild  tenderness in the epigastrium and somewhat in the right upper quadrant.  No peritoneal signs are elicited.  No hernias.  No masses are apparent.  SKIN:  Warm and dry.   PERTINENT LABORATORY AND RADIOGRAPHIC STUDIES:  He has had a recent HIDA  scan which demonstrates a 22% ejection fraction; however, he did not  demonstrate any symptomatology with the study upon ingestion of the  cream.   ASSESSMENT AND PLAN:  Biliary dyskinesia.  At this point, I had a long  discussion with the patient in regards to his clinical findings as well  as his HIDA scan findings.  Based on this, he does have biliary  dyskinesia based on the HIDA scan with a nonfunctional gallbladder.  However, I am not sure all of his symptomatology is associated with his  biliary disease, especially in light of the fact that his symptomatology  did not return or worsen upon administration of the cream during the  study.  I did discuss with the patient should his symptoms worsen during  this, I would be more comfortable discussing with him resolution of the  symptomatology.  I did state that with biliary dyskinesia that surgical  intervention is appropriate.  However, in light of his recent cardiac  history, I am again hesitant to proceed quickly to the operating room.  In light of this, I did discuss with the patient the risks, benefits,  and alternatives of laparoscopic, possible open cholecystectomy  including the risk of bleeding, infection, bile leak, small-bowel  injury, as well as common bile duct injury as well as but not limited to  the possibility of intraoperative pulmonary cardiac events especially  with his recently placed pacemaker defibrillator.  I did stress to the  patient that with his symptomatology, I feel it is unlikely that all of  his  symptoms will improve.  He does have symptomatology consistent with  biliary disease especially the bloating, nausea, and right upper  quadrant pain.  I did discuss with the patient that he would need prior  cardiac clearance and to refrain from taking aspirin prior to any  proceedings with operative intervention.  The patient is again informed  at length about his increased risk with his cardiac history of  possible  cardiac events at the time of an operation.  I would continue his beta-  blockade prior to any operative intervention, and at this point, we will  leave any determination up to the patient on proceeding with operative  intervention; laparoscopic, possible open cholecystectomy.  Again,  cardiac clearance will be required prior to proceeding, and the patient  again must be aware that his symptomatology may not improve with  cholecystectomy.  With this, the patient does wish to proceed, and again  we will obtain cardiac clearance.      Tilford Pillar, MD  Electronically Signed     BZ/MEDQ  D:  11/30/2007  T:  12/01/2007  Job:  [id number]   cc:   Madelin Rear. Sherwood Gambler, MD  Fax: [phone]

## 2010-12-22 NOTE — Letter (Signed)
May 20, 2008    Madelin Rear. Sherwood Gambler, M.D.  [address]  [place], [place] [address]   RE:  Mike Floyd, Mike Floyd  MRN:  [id number]  /  DOB:  1960-12-26   Dear Mike Floyd:   Mike Floyd returns to the office for continued assessment and treatment of  nonischemic cardiomyopathy.  Since his last visit, he notes some  increasing dyspnea.  He has had mild orthostatic lightheadedness.  We  reviewed his cardiac history once again.  He has never had pulmonary  edema.   CURRENT MEDICATIONS:  1. Nabumetone 750 mg b.i.d.  2. Cyclobenzaprine 10 mg t.i.d.  3. Protonix 40 mg daily.  4. Gabapentin 300 mg t.i.d.  5. Furosemide 40 mg daily.  6. KCl 40 mEq daily.  7. Lisinopril 10 mg daily.  8. Carvedilol 25 mg b.i.d.   PHYSICAL EXAMINATION:  GENERAL:  On exam, pleasant gentleman in no acute  distress.  VITAL SIGNS:  The weight is 243, 9 pounds more than 1 month ago.  Blood  pressure 90/60, heart rate 90 and regular, respirations 14.  NECK:  No jugular venous distention; normal carotid upstrokes without  bruits.  LUNGS:  Clear.  CARDIAC:  Normal first and second heart sounds; fourth heart sound  presents.  ABDOMEN:  Soft and nontender; no organomegaly.  EXTREMITIES:  Trace edema.   Chemistry profile obtained 1 week ago reveals potassium of 5.1,  creatinine of 1.4 and glucose of 197.   IMPRESSION:  Renal ultrasound shows probable fatty infiltration of the  liver and a complex nodule in the upper pole of the left kidney that is  unchanged since 2004.  Mike Floyd is no better symptomatically despite  titration of carvedilol and the addition of lisinopril.  His blood  pressure is marginal.  We will decrease carvedilol to 12.5 mg b.i.d. and  increase furosemide to 60 mg daily.  He does not really have an  indication for spironolactone.  We will check a hemoglobin A1c level and  chemistry profile in 1 week.  I suggested that Mike Floyd schedule a visit  with you to consider initiation of pharmacologic therapy for  diabetes.  I will see this nice gentleman again in 1 week.    Sincerely,      Gerrit Friends. Dietrich Pates, MD, [facility]  Electronically Signed    RMR/MedQ  DD: 05/20/2008  DT: 05/21/2008  Job #: [id number]

## 2010-12-22 NOTE — Letter (Signed)
November 08, 2007    Mike Floyd. Mike Emms, MD, [facility]  [address] [place]  [place], [place]  [address]   RE:  Mike Floyd, Mike Floyd  MRN:  [id number]  /  DOB:  1961-07-07   Dear Mike Floyd:   It was a pleasure to see your patient, Mike Floyd, today.  As you know, we  looked at him briefly together last week because of his tachycardia.  He  has a history of a catheter ablation years ago at [facility] and was  actually seen there about 4 years ago for this same problem of  symptomatic palpitations that was felt by Dr. Sampson Floyd to be probably  sinus in origin, either appropriate or inappropriate and further testing  was not pursued.   Also at that time, he was noted to have LV dysfunction in the setting of  normal coronary arteries and has had persistent LV dysfunction for the  ensuing 4 years.   He was recently admitted to hospital for chest pain at which point he  was submitted for catheterization again with the aforementioned results  being confirmed.   His past medical history, in addition to the above, is notable for GE  reflux disease, borderline diabetes, chronic back pain, neuropathy and  hearing impairment bilaterally.   SOCIAL HISTORY:  He is married.  He is a nondrinker.   MEDICATIONS:  1. Metoprolol succinate 100 b.i.d.  2. Lisinopril 5.  3. Furosemide 40.  4. Metolazone 5 daily.  5. Aspirin 81.  6. Gabapentin 300 t.i.d.  7. Cyclobenzaprine t.i.d.  8. Nebutome, dose unknown.   PAST SURGICAL HISTORY:  1. Back surgery.  2. Appendectomy.   REVIEW OF SYSTEMS:  There is also a report in the chart for a question  of polycythemia in 2001, although recent hemoglobins have been normal.   PHYSICAL EXAMINATION:  GENERAL:  This is a middle-aged, Caucasian male  appearing older than his stated age of 13 in no acute distress.  VITAL SIGNS:  Blood pressure was 110/70, pulse 88.  HEENT:  No icterus or xanthoma.  The neck veins were flat.  The carotids  are brisk and full bilaterally without  bruits.  BACK:  Without kyphosis, scoliosis.  LUNGS:  Clear.  HEART:  Regular without murmurs or gallops.  ABDOMEN:  Soft with active bowel sounds without midline pulsation or  hepatomegaly.  Femoral pulses were 2+.  Distal pulses were intact.  There is no clubbing, cyanosis or edema.  NEUROLOGIC:  Grossly normal.  SKIN:  Warm and dry.   Electrocardiogram dated today demonstrated heart rate of 90.  The  intervals were 0.15/0.10/0.35.  The axis was 50 degrees.  The  electrocardiogram and P-wave morphologies were identical to the heart  rate of 120-130 seen last week.   IMPRESSION:  1. Sinus rhythm with inappropriate tachycardia, question mechanism.  2. Ongoing cardiomyopathy of many years' duration, nonischemic with      ejection fraction of 30-35%.  3. Class IIB congestive heart failure.  4. Gastroesophageal reflux disease.   Mike Floyd has nonischemic cardiomyopathy with a tachycardia that is now  better controlled on the higher dose of metoprolol.  I am not quite sure  what mechanism it is.  He has been doing this for some years and has  been evaluated over the years for it.  I suspect that he has a somewhat  offset sinus rate.   We also discussed potential benefit of ICD implantation given his  congestive heart failure and nonischemic  cardiomyopathy.  We discussed  the potential benefits as well as potential risks and he is agreeable to  proceed with ICD implantation.  This will be scheduled at his  convenience.    Sincerely,      Duke Salvia, MD, [facility]  Electronically Signed    SCK/MedQ  DD: 11/08/2007  DT: 11/09/2007  Job #: [id number]   CC:    Madelin Rear. Sherwood Gambler, MD

## 2010-12-22 NOTE — H&P (Signed)
NAMEANDRU, GENTER NO.:  [id number]   MEDICAL RECORD NO.:  [id number]         PATIENT TYPE:  CINP   LOCATION:                               FACILITY:  [facility]   PHYSICIAN:  Christell Faith, MD   DATE OF BIRTH:  14-Jun-1961   DATE OF ADMISSION:  02/18/2008  DATE OF DISCHARGE:                              HISTORY & PHYSICAL   ADMITTING TO:  Noralyn Pick. Eden Emms, MD, [facility] with [facility].   PRIMARY CARE PHYSICIAN:  Madelin Rear. Sherwood Gambler, MD in [place], [place]  [place].   CHIEF COMPLAINT:  Chest pain.   HISTORY OF PRESENT ILLNESS:  This is a 50 year old white man with  nonischemic cardiomyopathy who presented to the [facility] Emergency  Department because of left-sided chest pain that started this evening  after cooking dinner.  It was at rest and it was initially described as  mild.  It actually began with shortness of breath, which then developed  into a nagging pressure in the left chest.  It persisted all night, so  he came to the emergency department.  He describes a course of worsening  dyspnea over the past few weeks, but no orthopnea or weight gain.  His  CK-MB is mildly abnormal, but this is consistent with prior admissions.  Incidentally, his BUN and creatinine are found to be elevated and he is  being admitted primarily because of acute renal failure.  He denies  change in urine output, hematuria, or foamy urine.  He denies starting  any new medicines recently.   PAST MEDICAL HISTORY:  1. SVT ablation at [facility] in [place] by Dr. Sampson Goon.  2. Nonischemic cardiomyopathy, possibly tachycardia-induced,      catheterization in March 2009.  It showed normal coronary arteries,      ejection fraction by cath was 35-40%, and by echo it has been 30-      35%.  3. Status post AICD placement in April 2009 by Dr. Graciela Husbands.  4. Status post lap chole in April 2009.  5. Status post lower back surgery in 1995.  6. Appendectomy in 1985.  7.  Bilateral carpal tunnel syndrome.  8. History of polycythemia, which has apparently resolved with      discontinuation of cigarettes.   SOCIAL HISTORY:  [place] lives in [place], [place].  He works as a  Curator and a Naval architect.  He formerly smoked 2 packs of cigarettes a  day, but quit 3 years ago.  He denies alcohol or drug use.   FAMILY HISTORY:  Mother is alive.  She has diabetes and history of  breast cancer.  Father died 5 years ago of ischemic heart failure.   ALLERGIES:  None.   MEDICATIONS:  He is unsure of his medicine list.  Prior records indicate  that he takes:  1. Toprol-XL 100 mg daily.  2. Aspirin 81 mg daily.  3. Lasix 40 mg daily.  4. Flexeril 5 mg t.i.d.  5. Neurontin 300 mg t.i.d.  6. Vicodin p.r.n.  7. Possibly lisinopril 5 mg.   REVIEW OF SYSTEMS:  Positive for feeling fatigued and tired over the  past few weeks, positive for worsening dyspnea, and positive for chest  pain today.  Otherwise, the balance of 14-systems is reviewed and is  negative.   PHYSICAL EXAMINATION:  VITAL SIGNS:  Temperature 97; pulse initially  113, rechecked 92, and rechecked 102; respiratory rate 18; blood  pressure 90/53 and saturation 96% on room air.  GENERAL:  This is a very pleasant white man in no acute distress.  He is  awake, alert, and oriented x3.  He has a hearing aid in on the right  side.  HEENT:  Head is normocephalic and atraumatic.  Pupils equal, round, and  reactive to light.  Extraocular movements are intact.  Mucous membranes  are moist.  No oral lesions.  NECK:  Supple.  No lymphadenopathy.  No goiter.  No carotid bruits.  Carotid upstrokes are normal with palpation.  Neck veins are flat.  CARDIAC:  Tachycardiac rate.  Regular rhythm.  No murmurs or gallops.  LUNGS:  Scattered rales in the right base, otherwise clear.  ABDOMEN:  Soft, nontender, and nondistended with no palpable  hepatomegaly.  EXTREMITIES:  No edema.  The feet appeared warm and  well-perfused with  no clubbing or cyanosis.  SKIN:  Multiple tattoos.  MUSCULOSKELETAL:  No acute joint effusions or deformities.  NEUROLOGIC:  He is hard of hearing, otherwise has 5/5 strength in all 4  extremities.  Sensation is intact.  Facial expressions are symmetric.   DIAGNOSTIC TESTS:  Chest x-ray reveals clear lungs and evidence of an  AICD.  EKG reveals sinus tachycardia with a rate of 112 beats per  minute.   LABORATORY:  White blood cell 9.1, hemoglobin 13.6, and platelets 157.  Sodium 137, potassium 3.7, bicarbonate 24, BUN 41, creatinine 3.5, and  glucose 102.  D-dimer 0.26, INR 1.1, CK 161, CK-MB 5.2, and troponin  negative.  In April 2009, BUN was 31 and creatinine was 1.49.   IMPRESSION:  A 50 year old white man with nonischemic cardiomyopathy  with mildly worsened congestive heart failure symptoms recently,  presenting with chest pain today.  He is incidentally found to be in  acute renal failure.   PLAN:  1. Admit to Dr. Eden Emms to telemetry bed.  Because of his chest pain      and mildly abnormal CK-MB, we will continue to cycle 3 sets of      cardiac enzymes and EKGs.  He does appear to have chronic CK-MB      elevation as evidenced in prior admissions.  2. Congestive heart failure, may be slightly worsened.  We will ask      his wife to confirm his medicine list.  We will plan on continuing      Toprol-XL and Lasix; however, we will hold his ACE inhibitor for      now because of his acute renal failure.  It is unclear whether he      is even taking the lisinopril as the medicine sounds unfamiliar to      him.  3. We will check a BNP and an echo given his change in renal function.      This could represent worsened forward output.  4. He wore a heart monitor earlier this year, but does not know the      results.  We will obtain the results of this and evaluate for      inappropriate persistent tachycardia.  The patient has had SVT  ablation in the past,  but was noted to be tachycardiac in Dr.      Fabio Bering clinic a few months ago and again today in the emergency      department.  He does have an AICD in place.  5. Acute renal failure.  We will repeat a full set of chemistries.      Check urinalysis and spun urine analysis.  We will check urine      electrolytes and renal ultrasound.  We will avoid renal toxic      medicines such as nonsteroidals.  6. We will slightly decrease his Neurontin dose, which may be      contributing to his fatigue, especially in the setting of decreased      GFR.  7. SubQ Lovenox for DVT prophylaxis.      Christell Faith, MD  Electronically Signed     NDL/MEDQ  D:  02/18/2008  T:  02/18/2008  Job:  [id number]

## 2010-12-22 NOTE — Discharge Summary (Signed)
Mike Floyd, Mike Floyd                 ACCOUNT NO.:  [id number]   MEDICAL RECORD NO.:  [id number]          PATIENT TYPE:  INP   LOCATION:  2852                         FACILITY:  [facility]   PHYSICIAN:  Mike Fells. Excell Seltzer, MD  DATE OF BIRTH:  [birth year]   DATE OF ADMISSION:  10/25/2007  DATE OF DISCHARGE:  10/26/2007                               DISCHARGE SUMMARY   DATE OF ADMISSION:  October 25, 2007.   DATE OF DISCHARGE:  October 26, 2007.   PRIMARY CARDIOLOGIST:  Previously seen by Dr. Vida Floyd, to be seen  by Dr. Charlton Floyd.   PRIMARY [facility] Floyd:  Dr. Elfredia Floyd.   DISCHARGE DIAGNOSIS:  Chest pain.   SECONDARY DIAGNOSES:  1. Nonischemic cardiomyopathy, EF 35-40%.  2. Normal coronary arteries by cath both in 2003 and again October 26, 2007.  3. Hypertension.  4. Borderline diabetes.  5. Gastroesophageal reflux disease.  6. History Wolff-Parkinson-White Syndrome status post radio frequency      ablation May 27, 1998 at [facility].  7. Chronic back pain.  8. Neuropathy.  9. Status post appendectomy.  10.Left ear deafness with partial loss in the right ear.  11.Remote tobacco abuse quitting in 2007.   ALLERGIES:  No known drug allergies.   PROCEDURE:  Left heart catheterization.   HISTORY OF PRESENT ILLNESS:  A 50 year old male with the above problem  list who was in his usual state of health until October 24, 2007 when he  noted chest discomfort and presented to primary care [facility].  ECG was  performed showing no acute changes and cardiac markers were also drawn.  Cardiac markers were reportedly abnormal.  The patient presented to  [facility] ED for evaluation.  In the ED, the patient complained of  chest discomfort was treated nitroglycerin, aspirin, morphine, Protonix,  and Lopressor.  CK initially was 496 with MB of 15.8 normal troponin of  0.04.  Decision made to transfer to [facility] for further evaluation.   HOSPITAL COURSE:  Troponin has remained  negative.  CKs and MBs have come  down to 50 and 8.2 respectively.  He underwent right cardiac  catheterization this  morning revealing normal coronary arteries with an  EF of 35-40% global hypokinesis.  Right heart pressures revealed a wedge  of 13 a PA of 21/8, with a mean of 15.  Cardiac output was 4.1 liters  per minute with an index of 1.8 liters per minute per meters squared.  The patient has not had any recurrent discomfort.  He has been  maintained on aspirin, beta blocker therapy.  We have initiated low-dose  ACE inhibitor given his nonischemic cardiomyopathy.  With reduction in  LV function.  We have arranged follow-up with Dr. Eden Floyd in our  [facility] office on April 13 at 1:30 p.m. and have asked that he follow  up with Dr. Sherwood Floyd in the next week or so.   DISCHARGE LABS:  Hemoglobin 15.7, hematocrit 44.2, WBC 5.1, platelets  202,000, MCV 91.6, sodium 135, potassium 3.5, chloride 97, CO2 30, BUN  14, creatinine  1.22, glucose 125, INR 1.0, total bilirubin 1.6, alkaline  phosphatase 86, AST 40, ALT 68, albumin 4.2, CK 252, MB 8.2, troponin I  0.01, total cholesterol 136, triglycerides 255, HDL 24, LDL 61, calcium  9.92.  Drug screen is pending.  Hemoglobin A1c is pending.  TSH is  pending.  Admission BNP was less than 70.   DISPOSITION:  The patient is being discharged home today in good  condition.  Followup appointment to follow up Dr. Sherwood Floyd in 1 to 2 weeks  and has followup Dr. Eden Floyd April 13 at 1:30 p.m.  Patient counseled on  the importance of sodium restriction and daily weights.   DISCHARGE MEDICATIONS:  1. Aspirin 81 mg daily.  2. Toprol XL 100 mg daily.  3. Lisinopril 5 mg daily.  4. Lasix 40 mg daily.  5. Cyclobenzaprine 5 mg t.i.d.  6. Gabapentin 300 mg t.i.d.  7. Nabumetone.  8. Hydrocodone - acetaminophen both as previously prescribed.   OUTSTANDING LAB STUDIES:  None.   DURATION DISCHARGE ENCOUNTER:  45 minutes including physician time.       Mike Floyd, ANP      Mike Fells. Excell Seltzer, MD  Electronically Signed    CB/MEDQ  D:  10/26/2007  T:  10/26/2007  Job:  [id number]   cc:   Mike Floyd. Mike Gambler, MD

## 2010-12-22 NOTE — Discharge Summary (Signed)
NAMECAILEB, RHUE                 ACCOUNT NO.:  [id number]   MEDICAL RECORD NO.:  [id number]          PATIENT TYPE:  INP   LOCATION:  3735                         FACILITY:  [facility]   PHYSICIAN:  Maple Mirza, PA   DATE OF BIRTH:  [birth year]   DATE OF ADMISSION:  11/21/2007  DATE OF DISCHARGE:  11/22/2007                               DISCHARGE SUMMARY   PRIMARY CARE Shahzad Thomann:  Madelin Rear. Sherwood Gambler, MD   FINAL DIAGNOSES:  1. Discharging of day 1 status post implant of a [employer] Jude CURRENT at      [place] VR RF single chamber cardioverter defibrillator with      defibrillator threshold study less than or equal to 25 joules, Dr.      Graciela Husbands.  2. Nonischemic cardiomyopathy with ejection fraction of 35% to 40%.  3. Resting tachycardia, better on metoprolol 100 mg b.i.d.  4. New York Heart Association class II - III congestive heart failure.   SECONDARY DIAGNOSES:  1. Left heart catheterization on March, 19, 2009, for chest pain.  No      significant stenosis, ejection fraction 35% to 40%.  2. History of orthodromic supraventricular tachycardia:  Concealed      posterolateral pathway, status post ablation in the past, Dr.      Sampson Goon.  3. Hypertension.  4. Chronic back pain.  5. Status post lumbar surgery.  6. Gastroesophageal reflux disease.  7. Status post appendectomy.  8. Polycythemia.  9. Neuropathy.  10.Remote tobacco habituation.   PROCEDURE:  On November 21, 2007, implant of single chamber cardioverter  defibrillator St. Jude model, Dr. Graciela Husbands.   BRIEF HISTORY:  Mr. Rodier is a 50 year old male.  He has a history of  catheter ablation of a SVT years ago, who presents to the office with a  tachycardia of uncertain origin.  This was examined by both Dr. Graciela Husbands  and by Dr. Eden Emms.   The patient was recently admitted to the hospital for chest pain and had  left heart catheterization on October 26, 2007.  The study showed that his  coronary arteries were angiographically clear of  coronary disease, but  he did have an ejection fraction of 35% to 40% which has been persistent  over about 3-4 years.  His diagnosis is a nonischemic cardiomyopathy.  This could possibly be aggravated by resting tachycardia.   The patient also has significant congestive heart failure symptoms, his  full diagnosis would be a nonischemic cardiomyopathy with a sinus rhythm  with inappropriate tachycardia, uncertain mechanism.  His tachycardia  seems to be improved with a higher dose of beta blocker.  He is  currently on metoprolol succinate 100 mg p.o. b.i.d.   The benefits of ICD implantation were discussed with the patient.  He  wishes to proceed.  Risks and benefits were discussed at that time.  The  patient will present electively.   HOSPITAL COURSE:  The patient presents on November 21, 2007.  He underwent  implantation of a single chamber cardioverter defibrillator the same  day.  He has had no postprocedural complications.  He is  ready for  discharge postprocedure day #1.  There is no hematoma in cardioverter  defibrillator pocket.  Chest x-ray shows that the lead is in appropriate  position.  The patient has had post implant interrogation by the [facility].  Doctor, general practice.  There are no changes and all values are within  normal limits.  The patient has been educated to keep his arm quiet for  the next 4 days and do not drive for the next week.  He is to keep his  incision dry for the next 7 days, to sponge bathe until Tuesday November 28, 2007.  He goes home on his regular medication dosage which are as  follows;  1. Nabumetone 750 twice daily.  2. Flexeril 10 mg three times daily.  3. Protonix 40 mg daily.  4. Enteric-coated aspirin 81 mg daily.  5. Gabapentin 300 mg three times daily.  6. Furosemide 40 mg daily.  7. Metolazone 5 mg daily.  8. Klor-Con 20 mEq three times daily.  9. Lisinopril 5 mg daily.  10.Metoprolol succinate 100 mg twice daily.   He is to follow up at the  [facility] office, for  incision check on Monday December 04, 2007.  Interrogation of his device in  August.  This appointment will be communicated to the patient at a later  date.   Laboratory studies pertinent to this admission, white cells 5.8,  hemoglobin 14.5, hematocrit is 39.9, and platelets are 204.  The pro-  time is 13.9, INR is 1.  Sodium 136, potassium is 4, chloride 96,  carbonate 32, glucose 86, BUN is 32, and creatinine 1.93.      Maple Mirza, PA     GM/MEDQ  D:  11/22/2007  T:  11/23/2007  Job:  [id number]   cc:   Duke Salvia, MD, [facility]  Madelin Rear. Sherwood Gambler, MD  Noralyn Pick Eden Emms, MD, [facility]

## 2010-12-22 NOTE — Discharge Summary (Signed)
NAMECOURTENAY, CREGER                 ACCOUNT NO.:  [id number]   MEDICAL RECORD NO.:  [id number]          PATIENT TYPE:  INP   LOCATION:  2005                         FACILITY:  [facility]   PHYSICIAN:  Noralyn Pick. Eden Emms, MD, FACCDATE OF BIRTH:  [birth year]   DATE OF ADMISSION:  02/17/2008  DATE OF DISCHARGE:  02/20/2008                               DISCHARGE SUMMARY   PRIMARY CARDIOLOGIST:  Gerrit Friends. Dietrich Pates, MD, [facility]   PRIMARY CARE PHYSICIAN:  Madelin Rear. Sherwood Gambler, MD in [place].   PROCEDURES PERFORMED DURING HOSPITALIZATION:  None.   CONSULTING PHYSICIAN:  Dr. Tresa Endo for Renal.   FINAL DISCHARGE DIAGNOSES:  1. Atypical chest pain.  2. Congestive heart failure with history of non-ischemic      cardiomyopathy with ejection fraction of 30-35%.  3. Supraventricular tachycardia ablation at [facility] in      [place] by Dr. Sampson Goon.  4. Status post automatic implantable cardioverter-defibrillator      placement in April 2009 by Dr. Graciela Husbands.  5. Acute renal failure associated with use of ACE and hypotension.   HOSPITAL COURSE:  This is a 50 year old Caucasian male with non-  ischemic cardiomyopathy who presented to [facility] Emergency Department  with complaints of left-sided chest pain with associated shortness of  breath.  The patient states that the dyspnea became worse over the past  few weeks prior to coming to the hospital.  The patient was seen in the  emergency room, and his CK-MB was mildly abnormal, but it was consistent  with prior admissions.  His BUN and creatinine were found to be  elevated, and he was admitted secondary to acute renal failure.  The  patient was seen and examined by Dr. Oneita Hurt, who is Cardiology  Fellow for Dr. Charlton Haws and admitted for further evaluation.  The  patient had ongoing chest discomfort, which was found to be reproducible  by palpation.  During hospitalization, the patient also had a renal  ultrasound completely per Dr.  Henrietta Hoover request on rounds.  The patient's  renal ultrasound was completed, and renal consult was requested.  It was  advised by Renal that the patient not be placed on ACE inhibitor or ARB  again with frequent monitoring of kidney function.  Hydralazine or  alternative afterload reduction could be used.  If the patient was  stable, there was no indication for further workup.  The patient would  not need Renal followup unless his creatinine remain elevated greater  than 1.5, long term.  The patient's cardiac enzymes were less than 0.01  x3 on his troponin.  His creatinine on admission was 3.5, but it was  reduced to 2.2 on discharge.  The patient's ACE inhibitor and Lasix were  discontinued during hospitalization.  The patient remained stable.  Blood pressure was 108/83 with a heart rate of 80.  The patient had no  further complaints of chest pain during hospitalization and was found to  be stable.  He is going to be discharge home with an outpatient stress  Myoview at [facility] and a followup appointment with  Dr.  [place] Bing at the end of July.   LABS ON DISCHARGE:  Troponins 0.01, 0.01, and 0.01 respectively.  Hemoglobin 13.6, hematocrit 38.5, white blood cells 9.1, and platelets  157.  Creatinine 1.73 and BUN 21.   DISCHARGE MEDICATIONS:  1. Lopressor 25 mg twice a day (new lower dose).  2. Cyclobenzaprine 10 mg 3 times a day.  3. Protonix 40 mg daily.  4. Aspirin 81 mg daily.  5. Hydrocodone 7.5/650 mg as needed.  6. Multivitamin daily.  7. Neurontin 300 mg 3 times a day.  8. Metolazone 5 mg daily.  9. The patient has been advised not to take Lasix or lisinopril on      discharge.   FOLLOWUP PLANS AND APPOINTMENTS:  1. She is to follow up at [facility] on March 05, 2008 at 8:30      a.m. for a stress Myoview.  2. The patient is to follow up with Dr. [place] Bing on March 07, 2008 at 2:15 p.m. for a followup appointment as an outpatient.  3. It has  been advised that the patient have a repeat BMET drawn at      Dr. Marvel Plan office visit.  4. The patient has been advised to not take Lasix or lisinopril at      home.   TIME SPENT WITH THE PATIENT TO INCLUDE PHYSICIAN TIME:  40 minutes.      Bettey Mare. Lyman Bishop, NP      Noralyn Pick. Eden Emms, MD, [facility]  Electronically Signed    KML/MEDQ  D:  02/20/2008  T:  02/21/2008  Job:  [id number]   cc:   Madelin Rear. Sherwood Gambler, MD

## 2010-12-22 NOTE — Consult Note (Signed)
NAMEMASOUD, NYCE                 ACCOUNT NO.:  [id number]   MEDICAL RECORD NO.:  [id number]          PATIENT TYPE:  AMB   LOCATION:  DAY                           FACILITY:  [facility]   PHYSICIAN:  Kassie Mends, M.D.      DATE OF BIRTH:  08-07-1961   DATE OF CONSULTATION:  08/21/2008  DATE OF DISCHARGE:                                 CONSULTATION   PRIMARY PHYSICIAN:  Madelin Rear. Sherwood Gambler, MD   PRIMARY CARDIOLOGIST:  Gerrit Friends. Dietrich Pates, MD, [facility]   REASON FOR VISIT:  Diarrhea, dysphagia, elevated liver enzymes.   HISTORY OF PRESENT ILLNESS:  Mr. Bartoletti is a 50 year old male, who has a  significant past surgical history of laparoscopic cholecystectomy  secondary to biliary dyskinesia.  He was diagnosed with diabetes for the  past 6 months.  He reports over the last 3 months that he has had 5-6  loose bowel movements a day.  He says they are loose, dark, and foul in  odor.  He denies any bright red blood per rectum, abdominal pain, or  fever.  Sometimes, he has nausea.  He denies any vomiting or travel.  He  does have well water.  He has had no stool samples.  He does have a  sense of rectal urgency and if he feels the urge to have a bowel  movement, he needs to go, otherwise he will have an accident in his  pants.  He has an accident 1-2 times a week for the last 3 months.  He  denies any rashes on his legs, sores in his mouth, antibiotic use, or  weight loss.  His appetite is good.  He does consume cheese, rarely  does.  He consume ice cream.  He does not consume milk.  He is chews 2-3  sticks of Freedent a day, which contained sorbitol.  He has no nocturnal  stools.  He has seen black stool, but he has also been using Pepto-  Bismol as needed.  He is on nabumetone off and on for the last year.  His dose of potassium has not been recently increased.  He complains of  solid dysphagia every day.  He has a little difficulty swallowing  liquids.  He was given a prescription for Lomotil for his  diarrhea, but  chose to use Imodium instead because it is cheaper.  He has used up to 4  a day and does not affect his diarrhea.  He stopped taking aspirin  approximately 2 months ago.  Pepto-Bismol did not help with his loose  stool.  He does consume fried foods.  He had a defibrillator placed in  April 2009.   PAST MEDICAL HISTORY:  1. Nonischemic cardiomyopathy.  2. Gastroesophageal reflux disease.  3. History of supraventricular tachycardia with ablation at [facility].  4. Chronic pain.  5. Hearing impairment.   PAST SURGICAL HISTORY:  Appendectomy in 1985, back surgery in 1995,  defibrillator placed in April 2009.   ALLERGIES:  No known drug allergies.   MEDICATIONS:  1. Klor-Con 60 mEq a day.  2. Multivitamin daily.  3. Lasix 90 mg daily.  4. Gabapentin 300 mg t.i.d.  5. Protonix 40 mg daily.  6. Cyclobenzaprine 10 mg t.i.d.  7. Nabumetone 75 mg b.i.d.  8. Hydrocodone 10.5/650 as needed.  9. Lisinopril 10 mg daily.  10.Coreg 12.5 mg b.i.d.  11.Glipizide 2.5 mg daily.   FAMILY HISTORY:  He has no family history of colon cancer or colon  polyps.  His mother had breast cancer.  He has no family history of  uterine or ovarian cancer.  He has no family history of diarrheal  illnesses.   SOCIAL HISTORY:  He is married and has no children.  He works as a  Curator and a Naval architect.  He quit smoking approximately 3 years ago.  He denies any alcohol use.   REVIEW OF SYSTEMS:  His heartburn and indigestion is controlled with  Protonix.  His review of systems per the HPI as previously mentioned,  otherwise all systems are negative.   PHYSICAL EXAMINATION:  VITAL SIGNS:  Weight 245 pounds, height 6 feet 1  inches, BMI 32.3 (obese), temperature 97.5, blood pressure 110/82, pulse  80.GENERAL:  He is in no apparent distress, alert and oriented x4.HEENT:  Atraumatic and normocephalic.  Pupils are equal and reactive to light.  Mouth, no  oral lesions.  Posterior pharynx without erythema or  exudate.NECK:  Full range of motion.  No lymphadenopathy.LUNGS:  Clear  to auscultation bilaterally.CARDIOVASCULAR:  Regular rhythm, no murmur.  Normal S1-S2.ABDOMEN:  Bowel sounds are present, soft, nontender,  nondistended.  No rebound or guarding.EXTREMITIES:  No cyanosis or  edema.NEUROLOGIC:  He has no new focal neurologic deficits.   LABORATORY DATA:  January 2010, white count 6.1, hemoglobin 16.1, and  platelets 188.  BUN 21, creatinine 1.29, total bilirubin 0.9, alkaline  phosphatase 102, AST 39, ALT 106, albumin 5.3, TSH 0.998.   RADIOGRAPHIC STUDIES:  CT scan of the abdomen and pelvis in April 2009  done for right-sided abdominal pain and pelvic pain.  It revealed renal  cysts and sigmoid colon diverticulosis.   ASSESSMENT:  Mr. Martenson is a 50 year old male who has chronic diarrhea.  The differential diagnoses includes bile salt induced diarrhea, celiac  sprue, microscopic colitis, diabetic enteropathy, and a low likelihood  of giardiasis, Clostridium difficile colitis, inflammatory bowel  disease, villous adenoma, or colon cancer.  He also has solid dysphagia  and the differential diagnoses includes peptic stricture, primary  esophageal motility disorder and a low likelihood of esophageal  malignancy.  He has elevated liver enzymes, which are most likely  secondary to nonalcoholic steatohepatitis.  The differential diagnoses  includes Wilson's disease, hemochromatosis, alpha 1-antitrypsin  deficiency, and autoimmune hepatitis. Thank you for allowing me to see  Mr. Hendershott in consultation.  My recommendations as follows:   RECOMMENDATIONS:  1. Mr. Diveley is given discharge instructions in writing.  He is asked      to follow a soft diet.  His meat need to be chopped, ground, or      pooled.  His vegetables need to be soft like mashed potatoes.  He      is also to follow a low-fat diet.  He is given a handout on low-fat       diet.  2. He may use Bentyl 10 mg 1 every 4-6 hours as needed for diarrhea.      He has been informed that may cause dry mouth, dry eyes,  drowsiness, and urinary retention.  3. He should stop nabumetone.  4. He is to bring stool samples and get his blood test done, as soon      as possible.  5. Will schedule for an EGD and colonoscopy next Tuesday.  He will      have a HalfLytely bowel prep.  He should hold his glipizide on the      day of his procedure.  We also will place a magnet on his      defibrillator.  6. Followup appointment in 2 months.      Kassie Mends, M.D.  Electronically Signed     SM/MEDQ  D:  08/21/2008  T:  08/21/2008  Job:  [id number]   cc:   Madelin Rear. Sherwood Gambler, MD  Fax: [phone]   Gerrit Friends. Dietrich Pates, MD, [facility]  [address]  [place], [place] [address]

## 2010-12-22 NOTE — Letter (Signed)
April 23, 2008    Madelin Rear. Sherwood Gambler, MD  [address]  [place], [place] [address]   RE:  Mike, Floyd  MRN:  [id number]  /  DOB:  28-Dec-1960   Dear Mike Floyd:   [name] returns to the office following cardiac catheterization 6  months ago that revealed normal coronaries and moderately impaired left  ventricular systolic function and subsequently implantation of an AICD.  Since 2005, he has been seen by multiple physicians in our practice  including Dr. Dorethea Clan, Dr. Eden Emms, Dr. Excell Seltzer, Dr. Graciela Husbands and Dr. Ladona Ridgel.  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  He  is generally able to work without too much difficulty, but does note  exertional fatigue and some dyspnea.  He has had no chest discomfort.  He has had borderline renal function, which prompted discontinuation of  lisinopril some months ago.  He has chronic low back pain and has had  prior lumbosacral surgery.   CURRENT MEDICATIONS:  1. Nabumetone 75 mg b.i.d.  2. Cyclobenzaprine 10 mg t.i.d.  3. Protonix 40 mg daily.  4. Aspirin 81 mg daily.  5. Gabapentin 300 mg t.i.d.  6. Furosemide 40 mg daily.  7. Metolazone 5 mg daily.  8. Metoprolol 25 mg b.i.d.  9. KCl 40 mEq daily.  10.He also uses hydrocodone p.r.n.   PHYSICAL EXAMINATION:  GENERAL:  Very pleasant gentleman in no acute  distress.  VITAL SIGNS:  The weight is 234, 1 pound more than in the last month.  Blood pressure 110/80, heart rate 95 and regular, respirations 14.  NECK:  No jugular venous distention.  LUNGS:  Clear.  CARDIAC:  Normal first and second heart sounds; fourth heart sound but  no third heart sound present.  ABDOMEN:  Soft and nontender; no organomegaly.  EXTREMITIES:  No edema.   Most recent creatinine is 1.5 obtained 2 weeks ago.  Potassium was 3.0  at that time prompting an increase in his dose of potassium.   IMPRESSION:  [name] has cardiomyopathy with significant symptoms.  His  medical regimen is not optimized.   He has no vascular disease, so  aspirin will be discontinued.  Carvedilol will be substituted for  metoprolol and titrated upward.  He will restart lisinopril at a dose of  10 mg daily with close observation of renal function.  Metolazone will  be discontinued.  I will see Mr. Schueller frequently until his medical  therapy is optimized and we will plan a return office visit in 1 month.    Sincerely,      Gerrit Friends. Dietrich Pates, MD, [facility]  Electronically Signed    RMR/MedQ  DD: 04/23/2008  DT: 04/24/2008  Job #: [id number]

## 2010-12-22 NOTE — Cardiovascular Report (Signed)
Mike Floyd, Mike Floyd                 ACCOUNT NO.:  [id number]   MEDICAL RECORD NO.:  [id number]          PATIENT TYPE:  INP   LOCATION:  2852                         FACILITY:  [facility]   PHYSICIAN:  Veverly Fells. Excell Seltzer, MD  DATE OF BIRTH:  14-Jun-1961   DATE OF PROCEDURE:  10/26/2007  DATE OF DISCHARGE:                            CARDIAC CATHETERIZATION   PROCEDURES:  Left heart catheterization, right heart catheterization,  selective coronary angiography, left ventricular angiography.   INDICATION:  Mr. Vandeberg is a 50 year old gentleman who presented with  chest pain.  He had cardiac enzymes drawn as an outpatient and was found  to have an elevated total CK as well as CK-MB.  However, his troponin  was normal.  He has a history of nonischemic cardiomyopathy and normal  coronaries.  He was hospitalized and referred for cardiac  catheterization in the setting of chest pain, dyspnea and abnormal  biomarkers.   Risks and indications of the procedure were reviewed with the patient.  Informed consent was obtained.  The right groin was prepped, draped,  anesthetized with 1% lidocaine using modified Seldinger technique.  A 6-  French sheath was placed the right femoral artery and a 6-French sheath  was placed in the right femoral vein.  Standard Judkins catheters were  used for coronary angiography and left ventriculography.  A pullback  across the aortic valve was done.  An end-hole multipurpose catheter was  used for the right heart cath. Saturations were drawn from the superior  vena cava pulmonary artery and central aorta.  All catheter exchanges  were performed over a guidewire.  The patient tolerated the procedure  well and had no immediate complications.   FINDINGS:  Right atrial pressure mean of 4, right ventricular pressure  22/7, pulmonary artery pressure 21/8 with a mean of 15, pulmonary  capillary wedge pressure mean of 13, left ventricular pressure 115/10,  aortic pressure 117/86  with a mean of 100.   Oxygen saturation:  Pulmonary artery 65, superior vena cava 61, aorta  96.   Cardiac output by the Fick method 4.1 liters per minute.  Cardiac index  1.8 liters per minute per meter squared.   Coronary angiography, left mainstem:  The left mainstem is widely  patent.  It trifurcates into the LAD intermediate branch and left  circumflex.   LAD:  The LAD is a large-caliber vessel that courses down and wraps  around the LV apex.  There is no significant obstructive disease  throughout the LAD.  It supplies 3 small diagonal branches that have no  significant angiographic stenosis.   Left circumflex:  The left circumflex is large caliber.  There is a  small intermediate branch followed by 2 large OM branches and a moderate  size posterolateral branch.  There is no significant angiographic  stenosis throughout the left circumflex.   The right coronary artery is dominant.  It courses down and supplies 2  acute marginal branches, a PDA and 2 posterolateral branches.  There is  no significant stenosis throughout the right coronary artery.   Left ventriculography shows moderate global  hypokinesis with an LVEF of  35-40%, no mitral regurgitation.   ASSESSMENT:  1. Normal coronary arteries.  2. Moderate global left ventricular dysfunction.  3. Normal hemodynamics.   DISCUSSION:  Mr. Lusby has no significant CAD.  He clearly has a  nonischemic cardiomyopathy that has been stable over the last several  years as his low EF has been documented in the past.  His hemodynamics  are very well compensated.  I suspect his CK-MB elevation is noncardiac  in nature as his troponins have been within the normal range.  I think  it would be reasonable for discharge home later today.  He can follow up  in our [place] office.      Veverly Fells. Excell Seltzer, MD  Electronically Signed     MDC/MEDQ  D:  10/26/2007  T:  10/26/2007  Job:  [id number]

## 2010-12-22 NOTE — Discharge Summary (Signed)
NAMEADI, DORO                  ACCOUNT NO.:  [id number]   MEDICAL RECORD NO.:  [id number]           PATIENT TYPE:   LOCATION:                                 FACILITY:   PHYSICIAN:  Nicolasa Ducking, ANP      DATE OF BIRTH:   DATE OF ADMISSION:  DATE OF DISCHARGE:                               DISCHARGE SUMMARY   DATE OF ADMISSION:  10/09/07   DATE OF ADMISSION:  10/26/07   ADDENDUM:  Addendum to job # [id number]   MEDICATIONS LIST:  Metolazone 5 mg daily  K-Dur 40 mEq daily.      Nicolasa Ducking, ANP     CB/MEDQ  D:  10/26/2007  T:  10/26/2007  Job:  [id number]

## 2010-12-22 NOTE — H&P (Signed)
NAMESARTHAK, RUBENSTEIN                 ACCOUNT NO.:  [id number]   MEDICAL RECORD NO.:  [id number]          PATIENT TYPE:  INP   LOCATION:  2852                         FACILITY:  [facility]   PHYSICIAN:  Rollene Rotunda, MD, FACCDATE OF BIRTH:  02/04/1961   DATE OF ADMISSION:  10/25/2007  DATE OF DISCHARGE:                              HISTORY & PHYSICAL   PRIMARY CARE PHYSICIAN:  Madelin Rear. Sherwood Gambler, MD   REASON FOR PRESENTATION:  Evaluate patient for chest pain.   HISTORY OF PRESENT ILLNESS:  The patient is a pleasant 50 year old white  gentleman with a history of chest pain with normal coronaries,  apparently by catheterization in 1999 and 2003.  He has had a non-  ischemic cardiomyopathy diagnosed.  I see documentation of an EF of 30%  to 35% in 2005.  He was followed by Dr. Dorethea Clan.  He has also had SVT  with apparent ablation of Wolff-Parkinson-White tract at [facility].  He had been doing well for cardiovascular standpoint, and he  had not seen a cardiologist since 2006.  However, for the last week, he  has had chest discomfort.  This is similar to pain he has had in the  past, associated with his palpitations.  However, he has not felt any  palpitations.  He describes the pain as intermittent.  He describes it  as heavy and gripping.  It is substernal.  It radiates to his left  shoulder.  It comes and goes.  It may happen at rest.  It may happen  with exertion.  He will stop what he doing, and it will go away on its  own.  There has been some associated nausea.  He has got some chronic  shortness of breath and perhaps little bit more with his discomfort.  Has not describe any PND or orthopnea.  There is no associated  palpitations as mentioned.  He has not had any syncope.  He went to see  Dr. Sherwood Gambler today.  Some labs drawn, and his cardiac enzymes were  elevated.  His total CK at [facility] today was 496 with an MB of 15.8.  This is a ratio of 3.2.  However, troponins are only  0.04.  He has no  acute EKG changes.  He is currently pain free.   PAST MEDICAL HISTORY:  Non-ischemic cardiomyopathy as described above,  gastroesophageal reflux disease, borderline diabetes mellitus,  apparent supraventricular tachycardia status post ablation, chronic back  pain, neuropathy, deafness in his left ear and partial deafness in his  right ear.   PAST SURGICAL HISTORY:  Appendectomy.   ALLERGIES:  NONE.   MEDICATIONS:  Aspirin 81 mg daily, Neurontin 300 mg t.i.d., metoprolol  100 mg daily, Flexeril, nabumetone, Lasix 40 mg daily, Vicodin, Nexium.   SOCIAL HISTORY:  The patient lives in [place] with his wife.  He  works as a Chief Strategy Officer.  He has no children.  He quit  smoking in 2007.  He denies any drug use.   FAMILY HISTORY:  Is contributory for early coronary artery disease in  first-degree  relatives.  His mother has heart disease at a later age.  Father died with heart failure and a myocardial infarction, age 20.   REVIEW OF SYSTEMS:  Positive for mild lower extremity swelling, joint  pains.  Otherwise as stated in the HPI.   PHYSICAL EXAMINATION:  GENERAL:  The patient is pleasant and in no  distress.  VITAL SIGNS:  Blood pressure 121/76, heart rate 104 and regular,  afebrile, respiratory rate 16, 99% saturation on 2 liters.  HEENT:  Eyes are unremarkable.  Pupils equal, round, reactive to light,  fundi not visualized, oral mucosa unremarkable.  NECK:  No jugular distention to 45 degrees, carotid upstroke brisk and  symmetrical.  No bruits, thyromegaly.  LYMPHATICS:  Cervical, axillary, inguinal adenopathy.  LUNGS:  Clear to auscultation bilaterally.  BACK:  No costovertebral angle tenderness.  CHEST:  Unremarkable.  HEART:  PMI not displaced or sustained.  S1 and S2 within normal limits.  No S3-S4, no clicks, no rubs, no murmurs.  ABDOMEN:  Mildly obese, positive bowel sounds, normal in frequency and  pitch.  No bruits, rebound, no guarding  or midline pulsatile mass.  No  hepatomegaly, no splenomegaly.  SKIN:  No rashes, no nodules.  EXTREMITIES:  2+ pulse throughout, no edema, no cyanosis, no clubbing.  NEURO:  Oriented to person, place, and time.  Cranial nerves II-XII  grossly intact, motor grossly intact.   EKG:  Sinus rhythm, rate 98, axis within normal limits, intervals within  normal limits, no acute ST-T wave changes.   Chest x-ray apparently done at [facility], results pending.   LABORATORY:  WBC 5.7, hemoglobin 16.5, sodium 34, potassium 3.3, BUN 17,  creatinine 1.2, BNP less than 30.   ASSESSMENT/PLAN:  1. Chest:  The patient has chest discomfort that is worrisome for      unstable angina.  Has elevated CK-MB, though a normal troponin.  He      has no acute EKG changes.  At this point, he will be continued on      heparin and nitroglycerin drip.  He will get aspirin.  Will give      him a Plavix load.  He will continue on beta blocker.  He will need      a cardiac catheterization.  He understands the procedure, as he has      been through it a couple of times before.  He understands the risks      and benefits and agrees to proceed.  2. Cardiomyopathy:  Will reassess his ejection fraction at the time of      his catheterization, also check an echocardiogram.  We will manage      this as indicated medically.  I do see that he was on an ARB at one      point in time, and he will need to be back on either an ACE or ARB,      pending the results of his ejection fraction.  3. Diabetes.  This has been borderline apparently.  He was not      medicated for this.  With check a hemoglobin A1c.  4. Risk reduction.  Will check a lipid profile and treat accordingly.  5. Questionable hypothyroidism.  There is some mention of this in his      chart.  Will check a TSH.      Rollene Rotunda, MD, [facility]  Electronically Signed     JH/MEDQ  D:  10/25/2007  T:  10/26/2007  Job:  [id number]   cc:   Madelin Rear. Sherwood Gambler,  MD

## 2010-12-22 NOTE — Op Note (Signed)
NAMESMOKEY, MELOTT                 ACCOUNT NO.:  [id number]   MEDICAL RECORD NO.:  [id number]          PATIENT TYPE:  AMB   LOCATION:  DAY                           FACILITY:  [facility]   PHYSICIAN:  Tilford Pillar, MD      DATE OF BIRTH:  July 14, 1961   DATE OF PROCEDURE:  12/06/2007  DATE OF DISCHARGE:                               OPERATIVE REPORT   PREOPERATIVE DIAGNOSIS:  Biliary dyskinesia.   POSTOPERATIVE DIAGNOSIS:  Biliary dyskinesia.   PROCEDURE:  Laparoscopic cholecystectomy.   SURGEON:  Tilford Pillar, MD.   ANESTHESIA:  General endotracheal, local anesthetic, 1% Sensorcaine  plain.   ESTIMATED BLOOD LOSS:  Minimal.   SPECIMEN:  Gallbladder.   INDICATIONS:  The patient is a 50 year old male who presented to my  office as an outpatient with a history of epigastric pain, especially  after eating fatty, greasy foods.  He had had an initial workup  consistent with biliary dyskinesia including a HIDA scan with some  exacerbation of his symptomatology; however, not all of the  symptomatologies were duplicated with the ingestion of the cream during  the HIDA scan.  Based on these findings, it was discussed with the  patient the risks, benefits, and alternatives of a laparoscopic possible  open cholecystectomy, including but not limited to the risk of bleeding,  infection, bile leak, small bowel injury, common bile duct injury as  well as the possibility of intraoperative pulmonary or cardiac  complications.  In addition to this, the patient had had a recent  pacemaker and defibrillator placed.  With this recent placement, it was  discussed with the patient that he is a high risk for cardiac event.  The patient understands these risks and did wish to proceed.  The  company representative for his pacemaker defibrillator will be present  at the time of the operation to deactivate as well as to interrogate the  device upon the completion of the operation.  Additionally at the  morning of his operation, the patient's wife had requested fixing a  diagnosed hiatal hernia at the same time as his cholecystectomy.  It was  discussed with both of them at length the inability to do this during  this operation and the complexity of that type of an operation and that  the benefits would not outweigh the risk at this time.  It was discussed  with them that failure of medical therapy or worsening symptomatology  may make him a candidate in the future for his hiatal hernia; however,  at this time there are no indications to proceed with the hiatal hernia  repair.  This was discussed with both the patient and his wife and they  do express understanding.   OPERATION:  The patient was taken to the operating room and was placed  in supine position on the operative table, at which time the general  anesthetic was administered.  Once the patient was asleep, he was  endotracheally intubated by anesthesia.  At this point, his abdomen was  prepped and draped in the usual fashion.  A stab incision was  created  supraumbilically with the scalpel.  Additional dissection down the  subcuticular tissue was carried out using a Kocher clamp which was  utilized to grasp the anterior abdominal wall fascia.  With this  anteriorly, a Veress needle was inserted.  Saline drop test was utilized  to confirm intraperitoneal placement and then pneumoperitoneum was  obtained.  Once sufficient pneumoperitoneum was obtained, an 11-mm  trocar was inserted over the laparoscope allowing visualization of the  trocar entering into the peritoneal cavity.  At this point, the inner  cannula was removed.  The laparoscope was reinserted.  There was no  evidence of any Veress or trocar placement injury.  At this time, the  remaining trocars were placed with an 11-mm trocar in the epigastrium  and a 5-mm trocar in the midline between the two 11-mm trocars and a 5-  mm trocar in the right lateral abdominal wall.  The  patient was then  placed in a reverse Trendelenburg left lateral decubitus position.  The  gallbladder fundus was lifted up and over the right lobe of the liver.  Blunt dissection was utilized to strip the peritoneum off of the  infundibulum allowing visualization of the cystic duct entering into the  infundibulum.  At this point, a window was created behind the cystic  duct.  Three EndoClips were placed proximally and one distally, and the  cystic duct was divided between 2 most distal clips.  Similarly, the  cystic artery was identified.  A window was created behind the cystic  artery.  Two EndoClips were placed proximally and one distally, and the  cystic artery was divided between 2 distal clips.  At this point,  electrocautery was utilized to dissect the gallbladder free from the  gallbladder fossa.  Once free, it was placed in the Endocatch bag.  It  was placed up and over the right lobe of the liver.  Hemostasis was  obtained using electrocautery.  At this point, using an Endoclose suture  passing device, a 2-0 Vicryl was passed through both the 11-mm trocar  sites.  With these 2 Vicryl sutures in place, a piece of Surgicel was  placed into the gallbladder fossa for additional hemostasis, and the  gallbladder was removed through the epigastric trocar site.  Some blunt  dilatation was required for adequate room for removal of gallbladder.  Gallbladder was removed intact in the Endocatch bag, which was placed on  the back table and was sent as a permanent specimen to pathology.  At  this point, the trocars were removed.  The Vicryl sutures were secured.  The local anesthetic was instilled, and 4-0 Monocryl was utilized to  reapproximate the skin edges at all 4 trocar sites.  The skin was washed  and dried with a moist and dry towel.  Benzoin was applied around the  incision.  Half-inch Steri-Strips were placed.  The drapes were removed,  and the patient was allowed to come out of  general anesthetic and was  transferred back to a regular hospital bed and transferred to  Postanesthetic Care Unit in stable condition.  At the conclusion of the  procedure, all instrument, sponge, and needle counts were correct.  The  patient tolerated the procedure well.      Tilford Pillar, MD  Electronically Signed     BZ/MEDQ  D:  12/06/2007  T:  12/07/2007  Job:  [id number]

## 2010-12-22 NOTE — Letter (Signed)
June 20, 2008    Madelin Rear. Sherwood Gambler, MD  [address]  [place], [place] [address]   RE:  TRAVARIUS, LANGE  MRN:  [id number]  /  DOB:  [birth year]   Dear [name]:   Mr. Harnois returns to the office for continued assessment and treatment of  nonischemic cardiomyopathy.  Since I last saw him 1 month ago, he has  done well from a cardiac standpoint.  He reports no dyspnea, orthopnea,  nor PND.  He has had no pedal edema.  He is principally been troubled by  pain in the right foot attributed to gout.  He also has episodes of  numbness in his fingers.  This occurs unpredictably.  There is no  relationship to environmental temperature or activity.  It involves all  of his fingers and so is not consistent with carpal tunnel, although he  has been treated for this presumptive diagnosis in the past.  He reports  some clumsiness with his hands and unaccustomed weakness.   CURRENT MEDICATIONS:  1. Nabumetone 75 mg b.i.d.  2. Cyclobenzaprine 10 mg t.i.d.  3. Protonix 40 mg daily.  4. Gabapentin 300 mg t.i.d.  5. Furosemide 60 mg daily.  6. KCl 40 mEq daily.  7. Lisinopril 10 mg daily.  8. Carvedilol 12.5 mg b.i.d.  9. Glipizide ER 2.5 mg daily.   PHYSICAL EXAMINATION:  GENERAL:  Pleasant gentleman in no acute  distress.  VITAL SIGNS:  The weight is 243, stable.  Blood pressure 100/80, heart  rate 90 and regular, respirations 13.  NECK:  No jugular venous distention; no carotid bruits.  Lungs:  Clear.  CARDIAC:  Normal first and second heart sounds; modest systolic murmur.  ABDOMEN:  Soft and nontender; no organomegaly.  EXTREMITIES:  No edema.   Most recent chemistry profile is from 3 weeks ago.  Potassium was 5.0,  BUN 18, and creatinine 1.4.   IMPRESSION:  Mr. Samples is doing well from a cardiac standpoint.  Renal  function has improved despite an increase in his dose of diuretics.  He  is tending towards hyperkalemia.  His dose of potassium will be reduced  to 20 mEq daily.  A repeat  chemistry profile will be obtained in 5  weeks.   Now, the medical therapy has stabilized, we will proceed to evaluate  left ventricular systolic function.  Echocardiograms has been  technically inadequate in the past.  A MUGA will be performed.  If  ejection fraction is markedly reduced, Mr. Castellana will be referred for  consideration of an AICD.  Otherwise, I will reassess this nice  gentleman in 4 months.  Influenza vaccine was administered at the  patient's request.    Sincerely,      Gerrit Friends. Dietrich Pates, MD, [facility]  Electronically Signed    RMR/MedQ  DD: 06/20/2008  DT: 06/21/2008  Job #: [id number]

## 2010-12-22 NOTE — Assessment & Plan Note (Signed)
[facility]                         ELECTROPHYSIOLOGY OFFICE NOTE   NAME:Mike Floyd, Mike Floyd                        MRN:          [id number]  DATE:04/03/2008                            DOB:          [birth year]    Mr. Manon returns today for followup.  He is a very pleasant middle-aged  male with a nonischemic cardiomyopathy and congestive heart failure.  He  is status post ICD insertion by Dr. Graciela Husbands.  He also has a history of  SVT, status post ablation.  The patient has several specific complaints  today.  He notes increasing dyspnea with exertion, fatigue, and  weakness.  He also notes difficulty with urination.  He has a long  history of atypical chest pain, having been hospitalized in July for  this.  He still has these episodes, they are not related to exertion.  His wife who is with him today states that he is quite anxious and  somewhat afraid to go out and do things.   His current medications include:  1. Protonix 40 a day.  2. Aspirin 81 a day.  3. Gabapentin 300 t.i.d.  4. Furosemide 40 a day.  5. Metolazone 5 a day.  6. Potassium 20 t.i.d. daily.  7. Lisinopril 5 a day.  8. Multiple vitamins.  9. Lopressor 25 twice daily.   PHYSICAL EXAMINATION:  GENERAL:  He is a pleasant well-appearing middle-  aged man in no distress.  VITAL SIGNS:  Blood pressure was 117/76.  The pulse was 90 and regular.  Respirations were 18.  The weight was 233 pounds. NECK:  No jugular  venous distention.  LUNGS:  Clear bilaterally to auscultation.  No wheezes, rales or rhonchi  are present.  CARDIOVASCULAR:  Regular regular rate and rhythm.  Normal S1 and S2.  There is a soft S4 gallop.  ABDOMEN:  Soft and nontender.  EXTREMITIES:  Demonstrated no peripheral edema.   Interrogation of his defibrillator demonstrates a [employer] current RF.  R-waves were 11.  The impedance is 500.  The threshold is 0.75 at 0.5.  Battery voltage was greater than 3.2 volts.   Underlying rhythm was sinus  at 82.  There are no intercurrent ICD therapies.   IMPRESSION:  1. Nonischemic cardiomyopathy.  2. Congestive heart failure, class II.  3. Status post implantable cardioverter-defibrillator insertion.  4. Problems with urination including urgency and frequency and overall      difficulty with urination.   DISCUSSION:  Mr. Middlesworth is stable, and I have encouraged him to exercise  more and outlined I would like him to do this.  The patient will have a  BMP that drawn today as well as potassium and creatinine levels and I  will obtain a PSA to see if he has evidence of BPH.  I will plan having  him follow up with Dr. Dietrich Pates, who is primary cardiologist in the next  month or so.     Doylene Canning. Ladona Ridgel, MD  Electronically Signed    GWT/MedQ  DD: 04/03/2008  DT: 04/04/2008  Job #: [id number]

## 2010-12-25 NOTE — Procedures (Signed)
   Mike Floyd, Mike Floyd                           ACCOUNT NO.:  [id number]   MEDICAL RECORD NO.:  [id number]                   PATIENT TYPE:  OUT   LOCATION:  DFTL                                 FACILITY:  [facility]   PHYSICIAN:  Edward L. Juanetta Gosling, M.D.             DATE OF BIRTH:  1961/03/29   DATE OF PROCEDURE:  05/17/2002  DATE OF DISCHARGE:                                    STRESS TEST   INDICATIONS FOR PROCEDURE:  This patient is undergoing graded exercise test  because of chest discomfort.  He has a cardiac history of having had what  apparently was a torn papillary in an accessory pathway that required an  ablation, but all of this history was at [facility] and I do not have access to  the records, and I am not certain that that is exactly what happened.  At  any rate he has been having chest discomfort.  There are no  contraindications to graded exercise testing.   DESCRIPTION OF PROCEDURE:  This patient exercised for 6 minutes on the Bruce  protocol, reaching and sustaining 7.0 METS.  He had chest tightness and some  shortness of breath with exercise and very rapidly reached his maximum heart  rate.  He had no electrocardiographic changes suggestive of inducible  ischemia. He had no other symptoms during exercise.  His blood pressure  response to exercise was normal.   IMPRESSION:  1. No evidence of inducible ischemia.  2. Chest pain and shortness of breath with exercise.  3. Normal blood pressure response to exercise.  4. Relatively poor exercise tolerance.  He may need further cardiac workup     if his chest tightness is felt to be, indeed from a cardiac origin.                                               Edward L. Juanetta Gosling, M.D.    ELH/MEDQ  D:  05/17/2002  T:  05/18/2002  Job:  [id number]   cc:   Angus G. Renard Matter, M.D.

## 2010-12-25 NOTE — Cardiovascular Report (Signed)
   Mike Floyd, Mike Floyd                           ACCOUNT NO.:  [id number]   MEDICAL RECORD NO.:  [id number]                   PATIENT TYPE:  OIB   LOCATION:  2856                                 FACILITY:  [facility]   PHYSICIAN:  Salvadore Farber, M.D. [facility]         DATE OF BIRTH:  [birth year]   DATE OF PROCEDURE:  07/19/2002  DATE OF DISCHARGE:  07/19/2002                              CARDIAC CATHETERIZATION   PROCEDURES:  Left heart catheterization, left ventriculogram, coronary  angiography.   INDICATIONS:  The patient is a 50 year old gentleman who presented with  chest pain.  Exercise Cardiolite demonstrated no evidence of ischemia or  infarction but an ejection fraction of approximately 40%.  Echocardiogram  confirmed reduced ejection fraction without valvular disease.  Due to his  myopathy, he is referred for diagnostic angiography to exclude an ischemic  etiology for his impaired left ventricular systolic function.   DIAGNOSTIC TECHNIQUE:  Informed consent was obtained. Under 1% lidocaine  local anesthesia, a 6 French sheath was placed in the right femoral artery  using the modified Seldinger technique.  Angiography and ventriculography  were performed using JL4, JR4, and straight pigtail catheters.  The patient  tolerated the procedure well and was transferred to the holding room in  stable condition.  The sheath will be removed there.   COMPLICATIONS:  None.   FINDINGS:  1. Hemodynamics:  LV 119/11/19.  2. No aortic stenosis on pullback.  3. EF equaled 53rd percent without regional wall motion abnormality.  4. No mitral regurgitation.  5. Left main:  Angiographically normal.  6. LAD:  A large sized vessel giving rise two small diagonal branches.  It     is angiographically normal.  7. Ramus intermedius:  A small vessel which is angiographically normal.  8. Circumflex:  A large vessel giving rise to one large branching obtuse     marginal branch.  It is angiographically  normal.  9. RCA:  Moderate sized, dominant vessel which is angiographically normal.   IMPRESSION:  1. Angiographically normal coronary arteries.  2. Nonischemic cardiomyopathy with ejection fraction 53%.    PLAN:  Will continue his aspirin and add lisinopril 5 mg per day for  treatment of his mild nonischemic cardiomyopathy.                                                  Salvadore Farber, M.D. [facility]    WED/MEDQ  D:  07/19/2002  T:  07/20/2002  Job:  [id number]   cc:   Thomas C. Wall, M.D. [facility]  [address]  [place]  [place] [address]  Fax: 1   Angus G. Renard Matter, M.D.  [address]  [place]  [place] [address]  Fax: [phone]

## 2010-12-25 NOTE — Procedures (Signed)
Mike Floyd, Mike Floyd                           ACCOUNT NO.:  [id number]   MEDICAL RECORD NO.:  [id number]                   PATIENT TYPE:  OUT   LOCATION:  RAD                                  FACILITY:  [facility]   PHYSICIAN:  Vida Roller, M.D.                DATE OF BIRTH:  May 12, 1961   DATE OF PROCEDURE:  DATE OF DISCHARGE:                                  ECHOCARDIOGRAM   TAPE NUMBER:  LB [id number], tape count [id number] through 6035.   INDICATION:  A 50 year old male with chest discomfort.  Previous  echocardiogram from November of 2003 shows depressed LV function with an  ejection fraction of 40% with evidence of hypokinesis in the inferior septum  and inferior septal segments.  No significant valvular heart disease.   TECHNICAL QUALITY:  Today's study technical quality is difficult.   M-MODE TRACINGS:  1. The aorta is 41 mm.  2. The left atrium is 35 mm.  3. The septum is 13 mm.  4. The posterior wall is 10 mm.  5. LV diastolic dimension is 44 mm.  6. LV systolic dimension is 37 mm.   2-D AND DOPPLER IMAGING:  1. The left ventricle appears to be mildly dilated.  There is evidence of     depressed LV function.  Estimated ejection fraction 30 to 35%.  There is     inferior and inferoposterior, inferior septal hypokinesis.  The posterior     wall appears to be moderately thinned.  There is concentric left     ventricular hypertrophy.  Diastolic function appears to be mildly     impaired.  2. The right ventricle is dilated.  The free wall is hypokinetic.  There is     depressed right ventricular systolic function.  3. Both atria appeared to be top normal in size.  4. The aortic valve is not well seen, but it has no stenosis or     regurgitation.  5. The mitral valve appears to be morphologically unremarkable with no     stenosis or regurgitation.  6. The tricuspid valve has no regurgitation.  7. The pulmonic valve is not well seen.  8. The pericardial structures appeared normal.  9.  The inferior vena cava was not well seen.  10.      The ascending aorta was not well seen.     ___________________________________________                                            Vida Roller, M.D.   JH/MEDQ  D:  12/24/2003  T:  12/24/2003  Job:  [id number]

## 2010-12-25 NOTE — Discharge Summary (Signed)
   NAMEARYN, Mike Floyd                           ACCOUNT NO.:  [id number]   MEDICAL RECORD NO.:  [id number]                   PATIENT TYPE:   LOCATION:                                       FACILITY:   PHYSICIAN:  Joellyn Rued, P.A. [facility]              DATE OF BIRTH:  01-Aug-1961   DATE OF ADMISSION:  DATE OF DISCHARGE:                                 DISCHARGE SUMMARY   STRESS CARDIOLITE:   HISTORY:  [name] is a 50 year old white male who was seen in the office on  06/04/02 for evaluation of chest discomfort. He stated that he has had these  episodes over the last three to four months and describes them as radiating  from the center of chest across his chest and occasionally to the back and  at the base of the skull. He was prescribed nitroglycerin by Dr. Renard Matter;  however, he is not taking it. The discomfort usually only lasts less than a  minute and resolves on its own and is not related to exertion. He usually  has one to two episodes every day, and the frequency and severity has not  changed in the last three to four months. Does occasionally have associated  shortness of breath, diaphoresis. He does have a history of ablation in 1999  at [facility]. He initially had a stress test with Dr. Juanetta Gosling' office  on 10/9 and was felt to be negative. He was referred here for further  evaluation and stress Cardiolite.   SUMMARY:  Baseline EKG shows normal sinus rhythm and normal axis, frequent  PVCs, resting heart rate 90, blood pressure 130/90. Utilizing the Bruce  protocol, he ambulated for a total time of 8 minutes and 36 seconds,  achieving a maximum heart rate of 157, maximum blood pressure of 170/94,  which was 88% of the predicted maximum heart rate values. During the test,  he complained of shortness of breath and legs were numb. During the test, he  did have a rare PVC. No acute EKG changes. During recovery, it was noted  that he took a considerable time for his heart rate  to decrease. Final  results and images pending Dr. Vern Claude review.                                               Joellyn Rued, P.A. [facility]    EW/MEDQ  D:  06/21/2002  T:  06/21/2002  Job:  [id number]

## 2010-12-25 NOTE — Procedures (Signed)
   NAMESKIPPER, DACOSTA                           ACCOUNT NO.:  [id number]   MEDICAL RECORD NO.:  [id number]                   PATIENT TYPE:  OUT   LOCATION:  RAD                                  FACILITY:  [facility]   PHYSICIAN:  [place] Bing, M.D. [facility]           DATE OF BIRTH:  02-28-1961   DATE OF PROCEDURE:  07/03/2002                  AGE:  50  DATE OF DISCHARGE:                              SEX:  M                                CARDIAC ULTRASOUND   REFERRING PHYSICIAN:  Dr. Renard Matter and Dr. Daleen Squibb.   CLINICAL DATA:  A 50 year old gentleman with chest pain.   1. Technically suboptimal but adequate echocardiographic study.  2. Normal left atrium, right atrium, and right ventricle.  3. Mild aortic valvular sclerosis and annular calcification.  4. Normal tricuspid and pulmonic valves.  5. Delicate mitral valve with moderate annular calcification.  6. Normal internal dimension of the left ventricular; mild concentric LVH.     There is moderate hypokinesis of the inferoseptal and inferoseptal     segments with mild to moderate impairment in overall LV systolic     function. Estimated ejection fraction is 0.40.  7. Normal IVC.                                               [place] Bing, M.D. [facility]    RR/MEDQ  D:  07/04/2002  T:  07/04/2002  Job:  [id number]

## 2011-03-17 ENCOUNTER — Ambulatory Visit (INDEPENDENT_AMBULATORY_CARE_PROVIDER_SITE_OTHER): Payer: BC Managed Care – PPO | Admitting: Internal Medicine

## 2011-03-17 ENCOUNTER — Encounter: Payer: Self-pay | Admitting: Internal Medicine

## 2011-03-17 DIAGNOSIS — Z95810 Presence of automatic (implantable) cardiac defibrillator: Secondary | ICD-10-CM

## 2011-03-17 DIAGNOSIS — F17200 Nicotine dependence, unspecified, uncomplicated: Secondary | ICD-10-CM

## 2011-03-17 DIAGNOSIS — I5022 Chronic systolic (congestive) heart failure: Secondary | ICD-10-CM

## 2011-03-17 DIAGNOSIS — I428 Other cardiomyopathies: Secondary | ICD-10-CM

## 2011-03-17 NOTE — Assessment & Plan Note (Signed)
His device is working normally. We'll plan to recheck in several months. 

## 2011-03-17 NOTE — Patient Instructions (Signed)
**Note De-identified Mike Floyd Obfuscation** Your physician recommends that you continue on your current medications as directed. Please refer to the Current Medication list given to you today.  Your physician recommends that you schedule a follow-up appointment in: 1 year  

## 2011-03-17 NOTE — Progress Notes (Signed)
HPI Mr. Bhalla returns today for followup. He is a very pleasant 50 year old man with a long-standing dilated cardiomyopathy and chronic systolic heart failure. He is functional class 1-2. The patient denies chest pain or syncope or peripheral edema. He has recently been laid off from work. He has mild dyspnea. Allergies  Allergen Reactions  . Latex      Current Outpatient Prescriptions  Medication Sig Dispense Refill  . allopurinol (ZYLOPRIM) 300 MG tablet Take 300 mg by mouth daily.        . carvedilol (COREG) 25 MG tablet Take 25 mg by mouth 2 (two) times daily with a meal.        . cyclobenzaprine (FLEXERIL) 10 MG tablet Take 10 mg by mouth 3 (three) times daily as needed.        Marland Kitchen HYDROcodone-acetaminophen (LORTAB) 10-500 MG per tablet Take 1 tablet by mouth every 6 (six) hours as needed.        Marland Kitchen lisinopril (PRINIVIL,ZESTRIL) 20 MG tablet Take 20 mg by mouth daily.        . Multiple Vitamins-Minerals (MULTIVITAMIN WITH MINERALS) tablet Take 1 tablet by mouth daily.        . pantoprazole (PROTONIX) 40 MG tablet Take 40 mg by mouth daily.        . sucralfate (CARAFATE) 1 G tablet Take 1 g by mouth 4 (four) times daily.           No past medical history on file.  ROS:   All systems reviewed and negative except as noted in the HPI.   No past surgical history on file.   No family history on file.   History   Social History  . Marital Status: Married    Spouse Name: N/A    Number of Children: N/A  . Years of Education: N/A   Occupational History  . Not on file.   Social History Main Topics  . Smoking status: Never Smoker   . Smokeless tobacco: Never Used  . Alcohol Use: No  . Drug Use: No  . Sexually Active: Not on file   Other Topics Concern  . Not on file   Social History Narrative  . No narrative on file     BP 119/85  Pulse 119  Ht 6\' 2"  (1.88 m)  Wt 240 lb (108.863 kg)  BMI 30.81 kg/m2  SpO2 97%  Physical Exam:  Well appearing middle-aged man,  NAD HEENT: Unremarkable Neck:  No JVD, no thyromegally Lymphatics:  No adenopathy Back:  No CVA tenderness Lungs:  Clear. Well-healed ICD incision. HEART:  Regular rate rhythm, no murmurs, no rubs, no clicks Abd:  soft, positive bowel sounds, no organomegally, no rebound, no guarding Ext:  2 plus pulses, no edema, no cyanosis, no clubbing Skin:  No rashes no nodules Neuro:  CN II through XII intact, motor grossly intact  DEVICE  Normal device function.  See PaceArt for details.   Assess/Plan:

## 2011-03-17 NOTE — Assessment & Plan Note (Signed)
I continue to encourage him to stop smoking. He will try to reduce his tobacco use.

## 2011-03-17 NOTE — Assessment & Plan Note (Signed)
His current symptoms are well controlled. He will continue his current medical therapy and maintain a low-sodium diet. I have encouraged him to walk on a regular basis.

## 2011-03-30 ENCOUNTER — Other Ambulatory Visit: Payer: Self-pay

## 2011-03-30 ENCOUNTER — Emergency Department (HOSPITAL_COMMUNITY)
Admission: EM | Admit: 2011-03-30 | Discharge: 2011-03-30 | Disposition: A | Discharge status: Home / Self Care | Payer: BC Managed Care – PPO | Attending: Emergency Medicine | Admitting: Emergency Medicine

## 2011-03-30 ENCOUNTER — Emergency Department (HOSPITAL_COMMUNITY): Payer: BC Managed Care – PPO

## 2011-03-30 ENCOUNTER — Encounter (HOSPITAL_COMMUNITY): Payer: Self-pay

## 2011-03-30 DIAGNOSIS — R0602 Shortness of breath: Secondary | ICD-10-CM | POA: Insufficient documentation

## 2011-03-30 DIAGNOSIS — R0601 Orthopnea: Secondary | ICD-10-CM | POA: Insufficient documentation

## 2011-03-30 DIAGNOSIS — R112 Nausea with vomiting, unspecified: Secondary | ICD-10-CM | POA: Insufficient documentation

## 2011-03-30 DIAGNOSIS — I252 Old myocardial infarction: Secondary | ICD-10-CM | POA: Insufficient documentation

## 2011-03-30 DIAGNOSIS — E119 Type 2 diabetes mellitus without complications: Secondary | ICD-10-CM | POA: Insufficient documentation

## 2011-03-30 DIAGNOSIS — Z87891 Personal history of nicotine dependence: Secondary | ICD-10-CM | POA: Insufficient documentation

## 2011-03-30 LAB — URINE MICROSCOPIC-ADD ON

## 2011-03-30 LAB — DIFFERENTIAL
Eosinophils Absolute: 0.1 10*3/uL (ref 0.0–0.7)
Lymphocytes Relative: 21 % (ref 12–46)
Lymphs Abs: 1.2 10*3/uL (ref 0.7–4.0)
Neutrophils Relative %: 70 % (ref 43–77)

## 2011-03-30 LAB — BASIC METABOLIC PANEL
Calcium: 9.9 mg/dL (ref 8.4–10.5)
GFR calc Af Amer: >60 mL/min (ref >60)
GFR calc non Af Amer: 56 mL/min — ABNORMAL LOW (ref >60)
Sodium: 132 mEq/L — ABNORMAL LOW (ref 135–145)

## 2011-03-30 LAB — URINALYSIS, ROUTINE W REFLEX MICROSCOPIC
Ketones, ur: NEGATIVE mg/dL
Leukocytes, UA: NEGATIVE
Nitrite: NEGATIVE
Specific Gravity, Urine: 1.02 (ref 1.005–1.030)
pH: 5.5 (ref 5.0–8.0)

## 2011-03-30 LAB — CBC
Platelets: 111 10*3/uL — ABNORMAL LOW (ref 150–400)
RBC: 5.58 MIL/uL (ref 4.22–5.81)
WBC: 5.7 10*3/uL (ref 4.0–10.5)

## 2011-03-30 LAB — PRO B NATRIURETIC PEPTIDE: Pro B Natriuretic peptide (BNP): 25.2 pg/mL (ref 0–125)

## 2011-03-30 LAB — D-DIMER, QUANTITATIVE: D-Dimer, Quant: 0.34 ug/mL-FEU (ref 0.00–0.48)

## 2011-03-30 NOTE — ED Notes (Signed)
EKG was completed by DS at 12:27

## 2011-03-30 NOTE — ED Notes (Signed)
Pt reports feeling sob for the past week.  Pt went to Dr Sherwood Gambler this a.m and was sent her to be evaluated for CHF.  Pt reports increased sob when lying.  Pt also reports "feeling my heart race sometimes".  nad noted

## 2011-03-30 NOTE — ED Notes (Signed)
Pt c/o progressively worse sob x one week. Pt states he cannot lay flat because he cannot get his breath. No extremity swelling noted at this time.

## 2011-03-30 NOTE — ED Provider Notes (Signed)
Scribed for Joya Gaskins, MD, the patient was seen in room [facility] . This chart was scribed by Ellie Lunch. This patient's care was started at 1:06 PM.   CSN: [id number] Arrival date & time: 03/30/2011 12:10 PM  Chief Complaint  Patient presents with  . Shortness of Breath   Patient is a 50 y.o. male presenting with shortness of breath. The history is provided by the patient and the spouse.  Shortness of Breath  The current episode started more than 1 week ago. The problem occurs continuously. The problem has been unchanged. The symptoms are relieved by rest. The symptoms are aggravated by activity and a supine position. Associated symptoms include chest pressure, orthopnea and shortness of breath.   Mike Floyd is a 50 y.o. male who presents to the Emergency Department complaining of shortness of breath starting over a week ago. SOB is exacerbated by walking and laying down. Pt reports vomiting once a day. No hemaemesis. Denies diarrhea, bloody stool, chest or abdominal pain.   Referred by dr. Sherwood Gambler.  Pt was seen at 1:05.  Past Medical History  Diagnosis Date  . Diabetes mellitus   . Gout   . Myocardial infarct, old     Past Surgical History  Procedure Date  . Back surgery   . Appendectomy   . Cardiac defibrillator placement   . Cholecystectomy     No family history on file.  History  Substance Use Topics  . Smoking status: Former Games developer  . Smokeless tobacco: Never Used  . Alcohol Use: No      Review of Systems  Respiratory: Positive for shortness of breath.   Cardiovascular: Positive for orthopnea. Negative for leg swelling.  Gastrointestinal: Positive for nausea and vomiting. Negative for diarrhea.  Genitourinary: Negative for hematuria.  Neurological: Negative for headaches.  All other systems reviewed and are negative.    Physical Exam  BP 120/81  Pulse 102  Temp(Src) 97.4 F (36.3 C) (Oral)  Resp 20  Ht 6' 1.5" (1.867 m)  Wt 241 lb (109.317  kg)  BMI 31.37 kg/m2  SpO2 100%  Physical Exam CONSTITUTIONAL: Well developed/well nourished HEAD AND FACE: Normocephalic/atraumatic EYES: EOMI/PERRL ENMT: Mucous membranes moist NECK: supple no meningeal signs SPINE:entire spine nontender CV: S1/S2 noted, no murmurs/rubs/gallops noted. Defibrillator non tender. No pedal edema.  LUNGS: Lungs are clear to auscultation bilaterally, no apparent distress ABDOMEN: soft, nontender, no rebound or guarding GU:no cva tenderness NEURO: Pt is awake/alert, moves all extremitiesx4 EXTREMITIES: pulses normal, full ROM SKIN: warm, color normal PSYCH: no abnormalities of mood noted  OTHER DATA REVIEWED: Nursing notes, vital signs, and past medical records reviewed. All labs/vitals reviewed and considered xrays reviewed and considered    DIAGNOSTIC STUDIES: Oxygen Saturation is 100% on room air, normal by my interpretation.     Date: 03/30/2011  Rate: 97  Rhythm: normal sinus rhythm  QRS Axis: normal  Intervals: normal  ST/T Wave abnormalities: normal  Conduction Disutrbances:none  Narrative Interpretation:   Old EKG Reviewed: unchanged   LABS / RADIOLOGY:  Results for orders placed during the hospital encounter of 03/30/11  PRO B NATRIURETIC PEPTIDE      Component Value Range   BNP, POC 25.2  0 - 125 (pg/mL)  BASIC METABOLIC PANEL      Component Value Range   Sodium 132 (*) 135 - 145 (mEq/L)   Potassium 4.6  3.5 - 5.1 (mEq/L)   Chloride 96  96 - 112 (mEq/L)   CO2 27  19 - 32 (mEq/L)   Glucose, Bld 265 (*) 70 - 99 (mg/dL)   BUN 13  6 - 23 (mg/dL)   Creatinine, Ser 1.61  0.50 - 1.35 (mg/dL)   Calcium 9.9  8.4 - 09.6 (mg/dL)   GFR calc non Af Amer 56 (*) >60 (mL/min)   GFR calc Af Amer >60  >60 (mL/min)  CBC      Component Value Range   WBC 5.7  4.0 - 10.5 (K/uL)   RBC 5.58  4.22 - 5.81 (MIL/uL)   Hemoglobin 17.7 (*) 13.0 - 17.0 (g/dL)   HCT 04.5  40.9 - 81.1 (%)   MCV 88.4  78.0 - 100.0 (fL)   MCH 31.7  26.0 - 34.0 (pg)    MCHC 35.9  30.0 - 36.0 (g/dL)   RDW 91.4  78.2 - 95.6 (%)   Platelets 111 (*) 150 - 400 (K/uL)  DIFFERENTIAL      Component Value Range   Neutrophils Relative 70  43 - 77 (%)   Neutro Abs 4.0  1.7 - 7.7 (K/uL)   Lymphocytes Relative 21  12 - 46 (%)   Lymphs Abs 1.2  0.7 - 4.0 (K/uL)   Monocytes Relative 6  3 - 12 (%)   Monocytes Absolute 0.4  0.1 - 1.0 (K/uL)   Eosinophils Relative 2  0 - 5 (%)   Eosinophils Absolute 0.1  0.0 - 0.7 (K/uL)   Basophils Relative 1  0 - 1 (%)   Basophils Absolute 0.1  0.0 - 0.1 (K/uL)  D-DIMER, QUANTITATIVE      Component Value Range   D-Dimer, Quant 0.34  0.00 - 0.48 (ug/mL-FEU)  POCT I-STAT TROPONIN I      Component Value Range   Troponin i, poc 0.00  0.00 - 0.08 (ng/mL)   Comment 3            Dg Chest Portable 1 View  03/30/2011  *RADIOLOGY REPORT*  Clinical Data: Shortness of breath  PORTABLE CHEST - 1 VIEW  Comparison: 05/11/2010  Findings: Lungs are clear. No pleural effusion or pneumothorax.  Cardiomediastinal silhouette is within normal limits.  Left subclavian ICD.  IMPRESSION: No evidence of acute cardiopulmonary disease.  Original Report Authenticated By: Charline Bills, M.D.    MDM: d/w dr Simona Huh, cards, will see patient in office this week - will see tomorrow at 2pm Pt reports SOB on exertion for weeks.  No CP reported.  When I walked patient, no CP reported, he just reports SOB that has been present for weeks.  No cyanosis, no significant tachypnea noted on my exam when he ambulated No ICD shocks.  He admits this has been present for weeks.  He has no h/o coronary art. Disease.  He is well appearing, stable for close followup     Procedures  I personally performed the services described in this documentation, which was scribed in my presence. The recorded information has been reviewed and considered. Joya Gaskins, MD       Joya Gaskins, MD 03/30/11 [phone]

## 2011-03-30 NOTE — ED Notes (Signed)
Pt sob after standing up and taking about 5 steps. O2 sat was 95%.

## 2011-03-31 ENCOUNTER — Ambulatory Visit (INDEPENDENT_AMBULATORY_CARE_PROVIDER_SITE_OTHER): Payer: BC Managed Care – PPO | Admitting: Adult Health

## 2011-03-31 ENCOUNTER — Encounter: Payer: Self-pay | Admitting: Adult Health

## 2011-03-31 DIAGNOSIS — Z95810 Presence of automatic (implantable) cardiac defibrillator: Secondary | ICD-10-CM

## 2011-03-31 DIAGNOSIS — I5021 Acute systolic (congestive) heart failure: Secondary | ICD-10-CM

## 2011-03-31 DIAGNOSIS — I428 Other cardiomyopathies: Secondary | ICD-10-CM

## 2011-03-31 DIAGNOSIS — R0609 Other forms of dyspnea: Secondary | ICD-10-CM

## 2011-03-31 DIAGNOSIS — R0989 Other specified symptoms and signs involving the circulatory and respiratory systems: Secondary | ICD-10-CM

## 2011-03-31 DIAGNOSIS — I5022 Chronic systolic (congestive) heart failure: Secondary | ICD-10-CM

## 2011-03-31 MED ORDER — HYDROCHLOROTHIAZIDE 12.5 MG PO TABS: 12.5000 mg | ORAL_TABLET | Freq: Every day | ORAL | Status: DC

## 2011-03-31 NOTE — Progress Notes (Signed)
HPI: [name] is a 50 y/o patient of Dr. Dietrich Pates and Dr. Ladona Ridgel we are following for ongoing assessment and treatment of dilated cardiomyopathy, systolic CHF with most recent ECHO demonstrating EF of 45%-50% in Sept of 2011, GERD, and former tobacco abuse, quitting in December of 2011 after 35 yrs of 1-2 ppd.   He comes today on follow-up after being seen in the ER for dyspnea.  Dr. Bebe Shaggy, ER physician, called to make appointment for further evaluation for same.  Labs and CXR were normal with the exception of blood glucose of 265.  He continues to have DOE and states he cannot sleep at night because of orthopnea symptoms.  He is anxious about this and states he is exhausted. He has not had any pulmonary work-up since 1996.  He denies chest pain, dizziness or palpitations.  No Known Allergies  Current Outpatient Prescriptions  Medication Sig Dispense Refill  . allopurinol (ZYLOPRIM) 300 MG tablet Take 300 mg by mouth daily.        Marland Kitchen aspirin 81 MG EC tablet Take 81 mg by mouth daily.        . carvedilol (COREG) 25 MG tablet Take 25 mg by mouth 2 (two) times daily with a meal.        . cyclobenzaprine (FLEXERIL) 10 MG tablet Take 10 mg by mouth 3 (three) times daily as needed.        . digoxin (LANOXIN) 0.125 MG tablet Take 125 mcg by mouth daily.        Marland Kitchen HYDROcodone-acetaminophen (LORTAB) 10-500 MG per tablet Take 1 tablet by mouth every 6 (six) hours as needed.        Marland Kitchen lisinopril (PRINIVIL,ZESTRIL) 20 MG tablet Take 20 mg by mouth daily.        . Multiple Vitamins-Minerals (MULTIVITAMIN WITH MINERALS) tablet Take 1 tablet by mouth daily.        . naproxen sodium (ANAPROX) 220 MG tablet Take 220 mg by mouth daily.        . pantoprazole (PROTONIX) 40 MG tablet Take 40 mg by mouth daily.        . sucralfate (CARAFATE) 1 G tablet Take 1 g by mouth 4 (four) times daily.        . hydrochlorothiazide (HYDRODIURIL) 12.5 MG tablet Take 1 tablet (12.5 mg total) by mouth daily.  30 tablet  3     Past Medical History  Diagnosis Date  . Diabetes mellitus   . Gout   . Myocardial infarct, old     Past Surgical History  Procedure Date  . Back surgery   . Appendectomy   . Cardiac defibrillator placement   . Cholecystectomy     ZOX:WRUEAV of systems complete and found to be negative unless listed above  PHYSICAL EXAM BP 102/66  Pulse 91  Ht 6' 1.5" (1.867 m)  Wt 242 lb (109.77 kg)  BMI 31.49 kg/m2  SpO2 95%  .General: Well developed, well nourished, in no acute distress Head: Eyes PERRLA, No xanthomas.   Normal cephalic and atramatic  Lungs: Clear bilaterally to auscultation , diminished breath sounds bibasilar, but without wheezes.  Occasional cough is noted with inspirations. Heart: HRRR S1 S2,  Pulses are 2+ & equal.            No carotid bruit. No JVD.  No abdominal bruits. No femoral bruits. Abdomen: Bowel sounds are positive, abdomen soft and non-tender without masses or    Hernia's noted. Obese. Msk:  Back normal,  normal gait. Normal strength and tone for age. Extremities: No clubbing, cyanosis or edema.  DP +1 Neuro: Alert and oriented X 3. Psych:  Good affect, responds appropriately    ASSESSMENT AND PLAN

## 2011-03-31 NOTE — Assessment & Plan Note (Signed)
Has had ICD checked by Dr. Ladona Ridgel 2 weeks ago and will continue to have follow-up with him as scheduled.

## 2011-03-31 NOTE — Patient Instructions (Addendum)
**Note De-Identified Equilla Que Obfuscation** Your physician has requested that you have an echocardiogram. Echocardiography is a painless test that uses sound waves to create images of your heart. It provides your doctor with information about the size and shape of your heart and how well your heart's chambers and valves are working. This procedure takes approximately one hour. There are no restrictions for this procedure.  Your physician has recommended that you have a pulmonary function test. Pulmonary Function Tests are a group of tests that measure how well air moves in and out of your lungs.  Your physician has recommended you make the following change in your medication: start taking Hydrochlorothiazide 12.5 mg daily   Your physician recommends that you schedule a follow-up appointment in: 1 month

## 2011-03-31 NOTE — Assessment & Plan Note (Signed)
He does not appear to have substantial fluid overload. Review of wts shows minimal wt gain of 2 lbs since being seen last, 2 weeks ago.  CXR yesterday in ER did not show evidence of CHF. Will repeat echo for annual evaluation of ischemic/dilated CM, and had low dose of HCTZ with evidence of diastolic dysfx.  I will have PFT's and ABG completed for evaluation of lung fx in the setting of long term tobacco abuse for >71yrs.  He will follow-up with Dr. Dietrich Pates in one month.  He will have these test results sent to Dr. Sherwood Gambler for further recommendations for pulmonary evaluation if necessary.

## 2011-04-02 ENCOUNTER — Other Ambulatory Visit (HOSPITAL_COMMUNITY): Payer: BC Managed Care – PPO

## 2011-04-06 ENCOUNTER — Ambulatory Visit: Payer: BC Managed Care – PPO | Admitting: Cardiology

## 2011-04-06 ENCOUNTER — Encounter (HOSPITAL_COMMUNITY): Payer: BC Managed Care – PPO

## 2011-04-06 ENCOUNTER — Ambulatory Visit (HOSPITAL_COMMUNITY): Payer: BC Managed Care – PPO

## 2011-04-07 ENCOUNTER — Ambulatory Visit (HOSPITAL_COMMUNITY)
Admission: RE | Admit: 2011-04-07 | Discharge: 2011-04-07 | Disposition: A | Discharge status: Home / Self Care | Payer: BC Managed Care – PPO | Source: Ambulatory Visit | Attending: Cardiology | Admitting: Cardiology

## 2011-04-07 ENCOUNTER — Ambulatory Visit (HOSPITAL_COMMUNITY)
Admission: RE | Admit: 2011-04-07 | Discharge: 2011-04-07 | Disposition: A | Discharge status: Home / Self Care | Payer: BC Managed Care – PPO | Source: Ambulatory Visit | Attending: Adult Health | Admitting: Adult Health

## 2011-04-07 DIAGNOSIS — R0602 Shortness of breath: Secondary | ICD-10-CM | POA: Insufficient documentation

## 2011-04-07 DIAGNOSIS — I059 Rheumatic mitral valve disease, unspecified: Secondary | ICD-10-CM

## 2011-04-07 DIAGNOSIS — I428 Other cardiomyopathies: Secondary | ICD-10-CM | POA: Insufficient documentation

## 2011-04-07 DIAGNOSIS — E119 Type 2 diabetes mellitus without complications: Secondary | ICD-10-CM | POA: Insufficient documentation

## 2011-04-07 LAB — BLOOD GAS, ARTERIAL
Acid-Base Excess: 0.3 mmol/L (ref 0.0–2.0)
Bicarbonate: 24.1 mEq/L — ABNORMAL HIGH (ref 20.0–24.0)
TCO2: 19.7 mmol/L (ref 0–100)
pCO2 arterial: 36.3 mmHg (ref 35.0–45.0)
pO2, Arterial: 89.6 mmHg (ref 80.0–100.0)

## 2011-04-07 LAB — PULMONARY FUNCTION TEST

## 2011-04-07 NOTE — Procedures (Signed)
Mike Floyd, Mike Floyd                 ACCOUNT NO.:  [id number]  MEDICAL RECORD NO.:  [id number]  LOCATION:  RESP                          FACILITY:  [facility]  PHYSICIAN:  Edward L. Juanetta Gosling, M.D.DATE OF BIRTH:  [birth year]  DATE OF PROCEDURE:  04/07/2011 DATE OF DISCHARGE:                           PULMONARY FUNCTION TEST   REASON FOR PULMONARY FUNCTION TESTING:  Shortness of breath. 1. Spirometry is essentially normal.  There is minimal airflow     obstruction. 2. Lung volumes are normal. 3. DLCO is normal. 4. Arterial blood gases normal. 5. Lung function testing does not demonstrate a definite cause of     shortness of breath.     Edward L. Juanetta Gosling, M.D.     ELH/MEDQ  D:  04/07/2011  T:  04/07/2011  Job:  [id number]  cc:   Gerrit Friends. Dietrich Pates, MD, [facility] [address] [place], [place] [address]

## 2011-04-07 NOTE — Progress Notes (Signed)
*  PRELIMINARY RESULTS* Echocardiogram 2D Echocardiogram has been performed.  Mike Floyd 04/07/2011, 3:16 PM

## 2011-04-30 ENCOUNTER — Encounter: Payer: Self-pay | Admitting: Cardiology

## 2011-05-03 ENCOUNTER — Ambulatory Visit (INDEPENDENT_AMBULATORY_CARE_PROVIDER_SITE_OTHER): Payer: BC Managed Care – PPO | Admitting: Adult Health

## 2011-05-03 ENCOUNTER — Encounter: Payer: Self-pay | Admitting: Adult Health

## 2011-05-03 ENCOUNTER — Ambulatory Visit: Payer: BC Managed Care – PPO | Admitting: Cardiology

## 2011-05-03 DIAGNOSIS — I5022 Chronic systolic (congestive) heart failure: Secondary | ICD-10-CM

## 2011-05-03 DIAGNOSIS — Z95810 Presence of automatic (implantable) cardiac defibrillator: Secondary | ICD-10-CM

## 2011-05-03 LAB — BASIC METABOLIC PANEL
Chloride: 96
GFR calc Af Amer: >60
Potassium: 3.3 — ABNORMAL LOW
Sodium: 134 — ABNORMAL LOW

## 2011-05-03 LAB — COMPREHENSIVE METABOLIC PANEL
AST: 40 — ABNORMAL HIGH
Albumin: 4.2
Chloride: 97
Creatinine, Ser: 1.22
GFR calc Af Amer: >60
Potassium: 3.5
Total Bilirubin: 1.6 — ABNORMAL HIGH

## 2011-05-03 LAB — URINE DRUGS OF ABUSE SCREEN W ALC, ROUTINE (REF LAB)
Barbiturate Quant, Ur: NEGATIVE
Cocaine Metabolites: NEGATIVE
Creatinine,U: 79
Ethyl Alcohol: <10
Opiate Screen, Urine: NEGATIVE
Phencyclidine (PCP): NEGATIVE

## 2011-05-03 LAB — PROTIME-INR
INR: 1
Prothrombin Time: 13.3
Prothrombin Time: 13.7

## 2011-05-03 LAB — APTT: aPTT: 63 — ABNORMAL HIGH

## 2011-05-03 LAB — CARDIAC PANEL(CRET KIN+CKTOT+MB+TROPI): Relative Index: 3.3 — ABNORMAL HIGH

## 2011-05-03 LAB — CBC
HCT: 45.6
HCT: 44.2
Hemoglobin: 16.5
MCV: 91
Platelets: 202
RBC: 5.01
WBC: 5.7
WBC: 5.1

## 2011-05-03 LAB — POCT I-STAT 3, ART BLOOD GAS (G3+)
Bicarbonate: 26.7 — ABNORMAL HIGH
Operator id: 115261
pH, Arterial: 7.422
pO2, Arterial: 84

## 2011-05-03 LAB — LIPID PANEL
LDL Cholesterol: 61
Triglycerides: 255 — ABNORMAL HIGH

## 2011-05-03 LAB — HEPARIN LEVEL (UNFRACTIONATED): Heparin Unfractionated: 0.31

## 2011-05-03 LAB — POCT I-STAT 3, VENOUS BLOOD GAS (G3P V)
Operator id: 115261
Operator id: 115261
pCO2, Ven: 47.1
pCO2, Ven: 48.9
pH, Ven: 7.38 — ABNORMAL HIGH
pH, Ven: 7.395 — ABNORMAL HIGH
pO2, Ven: 34

## 2011-05-03 LAB — B-NATRIURETIC PEPTIDE (CONVERTED LAB): Pro B Natriuretic peptide (BNP): <30

## 2011-05-03 LAB — CK TOTAL AND CKMB (NOT AT ARMC): Relative Index: 3.2 — ABNORMAL HIGH

## 2011-05-03 LAB — POCT CARDIAC MARKERS: Operator id: 208401

## 2011-05-03 LAB — TSH: TSH: 1.256

## 2011-05-03 LAB — TROPONIN I: Troponin I: 0.04

## 2011-05-03 MED ORDER — LISINOPRIL 20 MG PO TABS: 20.0000 mg | ORAL_TABLET | Freq: Every day | ORAL | Status: DC

## 2011-05-03 MED ORDER — CARVEDILOL 25 MG PO TABS: 25.0000 mg | ORAL_TABLET | Freq: Two times a day (BID) | ORAL | Status: DC

## 2011-05-03 NOTE — Assessment & Plan Note (Signed)
He is due to see Dr. Ladona Ridgel for check in Oct or November.  He says that the pacemaker people have not called him to do over the phone pacemaker checks like he has had in the past.  Having not heard from them in months. I will have our nurse follow-up with Gunnar Fusi RN to find out more about this.

## 2011-05-03 NOTE — Progress Notes (Signed)
Addended by: Demetrios Loll on: 05/03/2011 02:27 PM   Modules accepted: Orders, Medications

## 2011-05-03 NOTE — Assessment & Plan Note (Signed)
He appears stable from cardiac standpoint. Echocardiogram is actually improved since last year with EF now at 50-55% from 40-45%.  He is breathing better with institution of HCTZ.  PFT's do not demonstrate any lung disease or respiratory defects.  He admits that he does not wear CPAP at night as directed, as he cannot breath well with it. I have advised him to see his PCP about having another mask fitted to allow him to tolerate this more. He will follow-up with Dr. Dietrich Pates in 6 months. Refills on coreg, and lisinopril are provided.

## 2011-05-03 NOTE — Patient Instructions (Signed)
**Note De-identified Mike Floyd Obfuscation** Your physician recommends that you continue on your current medications as directed. Please refer to the Current Medication list given to you today.  Your physician recommends that you schedule a follow-up appointment in: 6 months  

## 2011-05-03 NOTE — Progress Notes (Signed)
HPI: Mike Floyd is a 50 y/o patient of Dr. Dietrich Pates and Dr. Ladona Ridgel we are following for ongoing assessment and treatment of dilated cardiomyopathy, systolic CHF,  GERD, and former tobacco abuse, quitting in December of 2011 after 35 yrs of 1-2 ppd.    He continues to have DOE and states he cannot sleep at night because of orthopnea symptoms.  He is anxious about this and states he is exhausted. He has not had any pulmonary work-up since 1996.  He denies chest pain, dizziness or palpitations.    On last visit, he was scheduled for PFT's and repeat echo as he had not had one in over a year with recurrent symptoms of dyspnea He was also started on low dose HCTZ.Marland Kitchen  He is here to discuss the results and evaluate his symptoms.    No Known Allergies  Current Outpatient Prescriptions  Medication Sig Dispense Refill  . allopurinol (ZYLOPRIM) 300 MG tablet Take 300 mg by mouth daily.        Marland Kitchen aspirin 81 MG EC tablet Take 81 mg by mouth daily.        . carvedilol (COREG) 25 MG tablet Take 25 mg by mouth 2 (two) times daily with a meal.        . cyclobenzaprine (FLEXERIL) 10 MG tablet Take 10 mg by mouth 3 (three) times daily as needed.        . digoxin (LANOXIN) 0.125 MG tablet Take 125 mcg by mouth daily.        . hydrochlorothiazide (HYDRODIURIL) 12.5 MG tablet Take 1 tablet (12.5 mg total) by mouth daily.  30 tablet  3  . lisinopril (PRINIVIL,ZESTRIL) 20 MG tablet Take 20 mg by mouth daily.        . Multiple Vitamins-Minerals (MULTIVITAMIN WITH MINERALS) tablet Take 1 tablet by mouth daily.        . naproxen sodium (ANAPROX) 220 MG tablet Take 220 mg by mouth daily.        Marland Kitchen oxyCODONE-acetaminophen (PERCOCET) 10-325 MG per tablet Take 1 tablet by mouth every 6 (six) hours as needed.        . pantoprazole (PROTONIX) 40 MG tablet Take 40 mg by mouth daily.        . Potassium Gluconate 595 MG CAPS Take 595 mg by mouth 2 (two) times daily.        . sucralfate (CARAFATE) 1 G tablet Take 1 g by mouth 4 (four)  times daily.          Past Medical History  Diagnosis Date  . Diabetes mellitus   . Gout   . Myocardial infarct, old     Past Surgical History  Procedure Date  . Back surgery   . Appendectomy   . Cardiac defibrillator placement   . Cholecystectomy     WUJ:[id number] of systems complete and found to be negative unless listed above  PHYSICAL EXAM BP 116/78  Pulse 104  Resp 18  Ht 6\' 1"  (1.854 m)  Wt 241 lb 12.8 oz (109.68 kg)  BMI 31.90 kg/m2  SpO2 96%  .General: Well developed, well nourished, in no acute distress Head: Eyes PERRLA, No xanthomas.   Normal cephalic and atramatic  Lungs: Clear bilaterally to auscultation , diminished breath sounds bibasilar, but without wheezes.  Occasional cough is noted with inspirations. Heart: HRRR S1 S2,  Slightly tachycardic Pulses are 2+ & equal.            No carotid bruit. No JVD.  No abdominal bruits. No femoral bruits. Abdomen: Bowel sounds are positive, abdomen soft and non-tender without masses or    Hernia's noted. Obese. Msk:  Back normal, normal gait. Normal strength and tone for age. Extremities: No clubbing, cyanosis or edema.  DP +1 Neuro: Alert and oriented X 3. Psych:  Good affect, responds appropriately    ASSESSMENT AND PLAN

## 2011-05-04 LAB — CBC
HCT: 39.4
Hemoglobin: 14.4
MCHC: 36.6 — ABNORMAL HIGH
WBC: 4.6

## 2011-05-04 LAB — BASIC METABOLIC PANEL
BUN: 31 — ABNORMAL HIGH
Chloride: 95 — ABNORMAL LOW
GFR calc non Af Amer: 50 — ABNORMAL LOW
Glucose, Bld: 103 — ABNORMAL HIGH
Potassium: 3.4 — ABNORMAL LOW
Sodium: 135

## 2011-05-06 LAB — POCT I-STAT, CHEM 8
Creatinine, Ser: 3.5 — ABNORMAL HIGH
Glucose, Bld: 102 — ABNORMAL HIGH
Hemoglobin: 12.9 — ABNORMAL LOW
TCO2: 24

## 2011-05-06 LAB — CK TOTAL AND CKMB (NOT AT ARMC)
CK, MB: 5.2 — ABNORMAL HIGH
CK, MB: 5.7 — ABNORMAL HIGH
Relative Index: 3.2 — ABNORMAL HIGH
Total CK: 184

## 2011-05-06 LAB — PROTIME-INR
INR: 1.1
Prothrombin Time: 14

## 2011-05-06 LAB — BASIC METABOLIC PANEL
BUN: 39 — ABNORMAL HIGH
CO2: 29
Creatinine, Ser: 2.2 — ABNORMAL HIGH
GFR calc Af Amer: >60
GFR calc non Af Amer: 32 — ABNORMAL LOW
GFR calc non Af Amer: 53 — ABNORMAL LOW
Glucose, Bld: 117 — ABNORMAL HIGH
Potassium: 3.8
Potassium: 4.1
Sodium: 137

## 2011-05-06 LAB — URINE CULTURE: Colony Count: 30000

## 2011-05-06 LAB — COMPREHENSIVE METABOLIC PANEL
ALT: 45
Alkaline Phosphatase: 72
CO2: 28
GFR calc non Af Amer: 27 — ABNORMAL LOW
Glucose, Bld: 106 — ABNORMAL HIGH
Potassium: 3.9
Sodium: 137
Total Protein: 5.8 — ABNORMAL LOW

## 2011-05-06 LAB — D-DIMER, QUANTITATIVE: D-Dimer, Quant: 0.26

## 2011-05-06 LAB — UIFE/LIGHT CHAINS/TP QN, 24-HR UR
Alpha 1, Urine: DETECTED — AB
Alpha 2, Urine: DETECTED — AB
Free Kappa Lt Chains,Ur: 2.34 — ABNORMAL HIGH (ref 0.04–1.51)
Gamma Globulin, Urine: DETECTED — AB

## 2011-05-06 LAB — URINALYSIS, ROUTINE W REFLEX MICROSCOPIC
Bilirubin Urine: NEGATIVE
Ketones, ur: NEGATIVE
Nitrite: NEGATIVE
Protein, ur: NEGATIVE
Urobilinogen, UA: 1

## 2011-05-06 LAB — LIPID PANEL
LDL Cholesterol: 55
Total CHOL/HDL Ratio: 5.9
VLDL: 58 — ABNORMAL HIGH

## 2011-05-06 LAB — CBC
MCV: 96.8
RBC: 3.97 — ABNORMAL LOW
WBC: 9.1

## 2011-05-06 LAB — DIFFERENTIAL
Basophils Absolute: 0
Basophils Relative: 0
Neutro Abs: 6.8
Neutrophils Relative %: 75

## 2011-05-06 LAB — PROTEIN ELECTROPH W RFLX QUANT IMMUNOGLOBULINS
Albumin ELP: 65.9
Alpha-1-Globulin: 4
Beta 2: 4.3
Beta Globulin: 5.7
Total Protein ELP: 6.7

## 2011-05-06 LAB — CREATININE, URINE, RANDOM: Creatinine, Urine: 187.9

## 2011-05-06 LAB — CARDIAC PANEL(CRET KIN+CKTOT+MB+TROPI): Troponin I: <0.01

## 2011-05-06 LAB — MAGNESIUM: Magnesium: 2.3

## 2011-05-28 ENCOUNTER — Encounter (HOSPITAL_COMMUNITY): Payer: Self-pay | Admitting: Cardiology

## 2011-05-31 ENCOUNTER — Encounter: Payer: Self-pay | Admitting: *Deleted

## 2011-05-31 ENCOUNTER — Telehealth: Payer: Self-pay | Admitting: *Deleted

## 2011-05-31 NOTE — Telephone Encounter (Signed)
Message copied by Gaynelle Adu on Mon May 31, 2011  8:56 AM ------      Message from: Kathlen Brunswick      Created: Sun May 30, 2011  2:02 PM       Diagnostic testing reviewed; Normal or stable results.      No change in medical therapy.

## 2011-05-31 NOTE — Telephone Encounter (Signed)
Unable to reach patient by phone, will send letter

## 2011-06-21 ENCOUNTER — Encounter: Payer: BC Managed Care – PPO | Admitting: *Deleted

## 2011-07-08 ENCOUNTER — Other Ambulatory Visit (HOSPITAL_COMMUNITY): Payer: Self-pay | Admitting: Internal Medicine

## 2011-07-08 DIAGNOSIS — R1310 Dysphagia, unspecified: Secondary | ICD-10-CM

## 2011-07-08 DIAGNOSIS — K219 Gastro-esophageal reflux disease without esophagitis: Secondary | ICD-10-CM

## 2011-07-12 ENCOUNTER — Ambulatory Visit (HOSPITAL_COMMUNITY): Payer: BC Managed Care – PPO

## 2011-07-17 ENCOUNTER — Other Ambulatory Visit: Payer: Self-pay | Admitting: Adult Health

## 2011-07-21 ENCOUNTER — Encounter (INDEPENDENT_AMBULATORY_CARE_PROVIDER_SITE_OTHER): Payer: Self-pay | Admitting: *Deleted

## 2011-07-27 ENCOUNTER — Ambulatory Visit (INDEPENDENT_AMBULATORY_CARE_PROVIDER_SITE_OTHER): Payer: BC Managed Care – PPO | Admitting: Internal Medicine

## 2011-07-27 ENCOUNTER — Encounter (INDEPENDENT_AMBULATORY_CARE_PROVIDER_SITE_OTHER): Payer: Self-pay | Admitting: *Deleted

## 2011-07-27 ENCOUNTER — Other Ambulatory Visit (INDEPENDENT_AMBULATORY_CARE_PROVIDER_SITE_OTHER): Payer: Self-pay | Admitting: *Deleted

## 2011-07-27 ENCOUNTER — Encounter (INDEPENDENT_AMBULATORY_CARE_PROVIDER_SITE_OTHER): Payer: Self-pay | Admitting: Internal Medicine

## 2011-07-27 DIAGNOSIS — R1310 Dysphagia, unspecified: Secondary | ICD-10-CM | POA: Insufficient documentation

## 2011-07-27 MED ORDER — SUCRALFATE 1 G PO TABS: 1.0000 g | ORAL_TABLET | Freq: Four times a day (QID) | ORAL | Status: DC

## 2011-07-27 NOTE — Patient Instructions (Signed)
Rx for Carafate E-prescribed to [facility] . Continue Protonix 40mg  BID. EGD/ED in the near future.

## 2011-07-27 NOTE — Progress Notes (Signed)
Subjective:     Patient ID: Mike Floyd, male   DOB: [birth year], 50 y.o.   MRN: [id number]  HPI Mike Floyd is a 50 yr old male referred to our office by Dr. Sherwood Gambler for dysphagia.  He tell me that foods and solids come back up in his esophagus.  He tells me he has a pulling sensation in his lower abdomen when he vomits.  He has had trouble swallowing since 2010. His appetite is good. No weight loss.  He has a BM about 3-4 times a day.  He was suppose to have a Barium Swallow test but he postponed the test.  He tells me he has dysphagia on a daily basis. Sometimes he can feel the food lodging.  In March of 2010 he had and EGD and a colonoscopy by Dr.Fields.  After procedures he did not follow up with her. INDICATION FOR EXAMINATION: Mike Floyd is a 50 year old male who presents  with history of gastroesophageal reflux disease. He also complains of  solid dysphagia. He complains of having diarrhea for 3 months. He has  5-6 loose bowel movements a day. He did maintain as an outpatient on  Klor-Con and nabumetone, as well as Protonix 40 mg daily. His BMI is  32.3 (obese).  FINDINGS:  1. Normal terminal ileum approximately 10 cm visualized.  2. Rare sigmoid colon diverticula. A 4-mm sessile transverse colon  polyp removed via cold forceps. A 4-mm sessile descending colon  polyp removed via cold forceps. Otherwise, no masses, inflammatory  changes, or AVMs seen.  3. Normal retroflexed view of the rectum.  4. Multiple linear erosions seen at the GE junction with overlying  exudate. Otherwise, no evidence of Barrett mass or stricture.  5. Patchy erythema in the gastric mucosa, in the antrum (antrum).  Biopsies obtained via cold forceps to evaluate for H. pylori  gastritis.  6. Normal-appearing duodenal bulb and second portion of the duodenum  with moderate bile staining. Biopsies obtained via cold forceps to  evaluate for celiac sprue as an etiology for diarrhea.  DIAGNOSES:  1. Colon polyps.  2. Mild  sigmoid colon diverticulosis.  3. Esophagitis, grade B likely contrinuting to dysphagia.  4. Mild gastritis.   Biopsy: 1. COLON, RANDOM BIOPSIES: - LYMPHOCYTIC COLITIS.  2. COLON, TRANSVERSE AND DESCENDING, POLYPS, BIOPSY: - TUBULAR ADENOMA. NO HIGH GRADE DYSPLASIA OR MALIGNANCY IDENTIFIED. (THREE FRAGMENTS) - BACKGROUND LYMPHOCYTIC COLITIS.  3. DUODENUM, BIOPSY: BENIGN SMALL BOWEL MUCOSA. NO VILLOUS ATROPHY, INFLAMMATION OR OTHER ABNORMALITIES PRESENT.  4. STOMACH, BIOPSY: BENIGN GASTRIC BODY MUCOSA, NO HELICOBACTER PYLORI, INTESTINAL METAPLASIA, DYSPLASIA OR MALIGNANCY     Review of Systems  See hpi Current Outpatient Prescriptions  Medication Sig Dispense Refill  . allopurinol (ZYLOPRIM) 300 MG tablet Take 300 mg by mouth daily.        Marland Kitchen aspirin 81 MG EC tablet Take 81 mg by mouth daily.        . carvedilol (COREG) 25 MG tablet Take 1 tablet (25 mg total) by mouth 2 (two) times daily with a meal.  60 tablet  6  . digoxin (LANOXIN) 0.125 MG tablet Take 125 mcg by mouth daily.        . hydrochlorothiazide (MICROZIDE) 12.5 MG capsule TAKE (1) CAPSULE BY MOUTH ONCE DAILY.  30 capsule  3  . lisinopril (PRINIVIL,ZESTRIL) 20 MG tablet Take 1 tablet (20 mg total) by mouth daily.  30 tablet  6  . Multiple Vitamins-Minerals (MULTIVITAMIN WITH MINERALS) tablet Take 1 tablet by mouth daily.        Marland Kitchen  oxyCODONE-acetaminophen (PERCOCET) 10-325 MG per tablet Take 1 tablet by mouth every 6 (six) hours as needed.        . pantoprazole (PROTONIX) 40 MG tablet Take 40 mg by mouth daily.        . Potassium Gluconate 595 MG CAPS Take 595 mg by mouth 2 (two) times daily.        . sucralfate (CARAFATE) 1 G tablet Take 1 tablet (1 g total) by mouth 4 (four) times daily.  120 tablet  1   Past Medical History  Diagnosis Date  . Diabetes mellitus     x 8 months  . Gout   . Myocardial infarct, old     One last year.  He has a defibrillatory in March 2009   Past Surgical History  Procedure Date    . Back surgery     1995  . Appendectomy   . Cardiac defibrillator placement   . Cholecystectomy    History   Social History  . Marital Status: Married    Spouse Name: N/A    Number of Children: N/A  . Years of Education: N/A   Occupational History  . Not on file.   Social History Main Topics  . Smoking status: Former Smoker    Quit date: 07/30/2010  . Smokeless tobacco: Never Used  . Alcohol Use: No  . Drug Use: No  . Sexually Active: Not on file   Other Topics Concern  . Not on file   Social History Narrative  . No narrative on file   Family Status  Relation Status Death Age  . Mother Alive     DM2, CAD, Kidney failure, Nursing Home.  . Father Deceased     CAD  . Sister Alive     good health  . Brother Alive     One has DM. One in good health   No Known Allergies     Objective:   Physical Exam  Filed Vitals:   07/27/11 1018  BP: 86/58  Pulse: 80  Temp: 97.7 F (36.5 C)  Height: 6\' 1"  (1.854 m)  Weight: 243 lb 9.6 oz (110.496 kg)     Alert and oriented. Skin warm and dry. Oral mucosa is moist. Natural teeth in good condition. Sclera anicteric, conjunctivae is pink. Thyroid not enlarged. No cervical lymphadenopathy. Lungs clear. Heart regular rate and rhythm.  Abdomen is soft. Bowel sounds are positive. No hepatomegaly. No abdominal masses felt. No tenderness.  No edema to lower extremities. Patient is alert and oriented.      Assessment:    Dysphagia to solids and liquids. Esophageal stricture needs to be ruled out.  Possible esophagitis  Plan:    EGD/ED in the near future. Carafate 1gm po 30 minutes before meals and at HS.  Continue Protonix 40mg  BID

## 2011-07-29 ENCOUNTER — Encounter (HOSPITAL_COMMUNITY): Payer: Self-pay | Admitting: Pharmacy Technician

## 2011-08-09 MED ORDER — SODIUM CHLORIDE 0.45 % IV SOLN
Freq: Once | INTRAVENOUS | Status: AC
  Administered 2011-08-11: 08:00:00 via INTRAVENOUS

## 2011-08-11 ENCOUNTER — Encounter (HOSPITAL_COMMUNITY): Payer: Self-pay

## 2011-08-11 ENCOUNTER — Encounter (HOSPITAL_COMMUNITY)
Admission: RE | Disposition: A | Discharge status: Home / Self Care | Payer: Self-pay | Source: Ambulatory Visit | Attending: Internal Medicine

## 2011-08-11 ENCOUNTER — Ambulatory Visit (HOSPITAL_COMMUNITY)
Admission: RE | Admit: 2011-08-11 | Discharge: 2011-08-11 | Disposition: A | Discharge status: Home / Self Care | Payer: BC Managed Care – PPO | Source: Ambulatory Visit | Attending: Internal Medicine | Admitting: Internal Medicine

## 2011-08-11 ENCOUNTER — Other Ambulatory Visit (INDEPENDENT_AMBULATORY_CARE_PROVIDER_SITE_OTHER): Payer: Self-pay | Admitting: Internal Medicine

## 2011-08-11 DIAGNOSIS — K208 Other esophagitis: Secondary | ICD-10-CM

## 2011-08-11 DIAGNOSIS — I10 Essential (primary) hypertension: Secondary | ICD-10-CM | POA: Insufficient documentation

## 2011-08-11 DIAGNOSIS — K228 Other specified diseases of esophagus: Secondary | ICD-10-CM

## 2011-08-11 DIAGNOSIS — Z01812 Encounter for preprocedural laboratory examination: Secondary | ICD-10-CM | POA: Insufficient documentation

## 2011-08-11 DIAGNOSIS — Z79899 Other long term (current) drug therapy: Secondary | ICD-10-CM | POA: Insufficient documentation

## 2011-08-11 DIAGNOSIS — K449 Diaphragmatic hernia without obstruction or gangrene: Secondary | ICD-10-CM

## 2011-08-11 DIAGNOSIS — R1310 Dysphagia, unspecified: Secondary | ICD-10-CM

## 2011-08-11 DIAGNOSIS — K2100 Gastro-esophageal reflux disease with esophagitis, without bleeding: Secondary | ICD-10-CM | POA: Insufficient documentation

## 2011-08-11 DIAGNOSIS — K219 Gastro-esophageal reflux disease without esophagitis: Secondary | ICD-10-CM

## 2011-08-11 DIAGNOSIS — E119 Type 2 diabetes mellitus without complications: Secondary | ICD-10-CM | POA: Insufficient documentation

## 2011-08-11 DIAGNOSIS — Z7982 Long term (current) use of aspirin: Secondary | ICD-10-CM | POA: Insufficient documentation

## 2011-08-11 LAB — GLUCOSE, CAPILLARY: Glucose-Capillary: 200 mg/dL — ABNORMAL HIGH (ref 70–99)

## 2011-08-11 SURGERY — ESOPHAGOGASTRODUODENOSCOPY (EGD) WITH ESOPHAGEAL DILATION
Anesthesia: Moderate Sedation

## 2011-08-11 MED ORDER — BUTAMBEN-TETRACAINE-BENZOCAINE 2-2-14 % EX AERO
INHALATION_SPRAY | CUTANEOUS | Status: DC | PRN
  Administered 2011-08-11: 2 via TOPICAL

## 2011-08-11 MED ORDER — MIDAZOLAM HCL 5 MG/5ML IJ SOLN
INTRAMUSCULAR | Status: DC | PRN
  Administered 2011-08-11 (×6): 2 mg via INTRAVENOUS

## 2011-08-11 MED ORDER — MEPERIDINE HCL 25 MG/ML IJ SOLN
INTRAMUSCULAR | Status: DC | PRN
  Administered 2011-08-11 (×2): 25 mg via INTRAVENOUS

## 2011-08-11 MED ORDER — MIDAZOLAM HCL 5 MG/5ML IJ SOLN
INTRAMUSCULAR | Status: AC
  Filled 2011-08-11: qty 5

## 2011-08-11 MED ORDER — MIDAZOLAM HCL 5 MG/5ML IJ SOLN
INTRAMUSCULAR | Status: AC
  Filled 2011-08-11: qty 10

## 2011-08-11 MED ORDER — MEPERIDINE HCL 50 MG/ML IJ SOLN
INTRAMUSCULAR | Status: AC
  Filled 2011-08-11: qty 1

## 2011-08-11 MED ORDER — STERILE WATER FOR IRRIGATION IR SOLN
Status: DC | PRN
  Administered 2011-08-11: 09:00:00

## 2011-08-11 MED ORDER — INSULIN ASPART 100 UNIT/ML ~~LOC~~ SOLN
3.0000 [IU] | Freq: Once | SUBCUTANEOUS | Status: AC
  Administered 2011-08-11: 3 [IU] via SUBCUTANEOUS

## 2011-08-11 NOTE — Op Note (Signed)
EGD PROCEDURE REPORT  PATIENT:  Mike Floyd  MR#:  [id number] Birthdate:  29-Jun-1961, 51 y.o., male Endoscopist:  Dr. Malissa Hippo, MD Referred By:  Dr. Madelin Rear. Sherwood Gambler, MD Procedure Date: 08/11/2011  Procedure:   EGD with ED.  Indications:  Patient is 51 year old Caucasian male with multiple medical problems including chronic GERD who presents with solid food dysphagia. His last EGD was in January 2010 revealing erosive esophagitis and a small sliding had hernia. He states his heartburn is well controlled with therapy.           Informed Consent:  The risks, benefits, alternatives & imponderables which include, but are not limited to, bleeding, infection, perforation, drug reaction and potential missed lesion have been reviewed.  The potential for biopsy, lesion removal, esophageal dilation, etc. have also been discussed.  Questions have been answered.  All parties agreeable.  Please see history & physical in medical record for more information.  Medications:  Demerol 50 mg IV Versed 12 mg IV Cetacaine spray topically for oropharyngeal anesthesia  Description of procedure:  The endoscope was introduced through the mouth and advanced to the second portion of the duodenum without difficulty or limitations. The mucosal surfaces were surveyed very carefully during advancement of the scope and upon withdrawal.  Findings:  Esophagus:  Mucosa of the proximal and middle third was normal. 3 small erosions noted at GE junction along with small patch of salmon-colored mucosa less than 10 mm in diameter. No ring or stricture identified. GEJ:  37 cm Hiatus:  40 cm Stomach:  Stomach was empty and distended very well with insufflation. Folds in the proximal stomach were normal. Examination of mucosa at body antrum pyloric channel as well as annularis fundus and cardia was normal. Duodenum:  Normal bulbar and post bulbar mucosa.  Therapeutic/Diagnostic Maneuvers Performed:  Esophagus dilated by  passing 56 French Maloney dilator to full insertion. No mucosal disruption noted. Op she taken from small patch of salmon-colored mucosa at still esophagus contiguous with the GE junction.  Complications:  None  Impression: Erosive reflux esophagitis improved but not completely healed since previous EGD 3 years ago. 3 cm size hiatal hernia. Esophagus dilated by passing 56 [place] Maloney dilator. Biopsy taken from small patch of salmon-colored mucosa at GEJ. Recommendations:  Anti-reflux measures reinforced. Continue pantoprazole at 40 mg by mouth twice a day. I will be contacting patient with results of biopsy. If dysphagia persists he will need esophageal manometry.  [name],NAJEEB U  08/11/2011  9:06 AM  CC: Dr. Cassell Smiles., MD, MD & Dr. Bonnetta Barry ref. provider found

## 2011-08-11 NOTE — H&P (Signed)
This is an update to history and physical from 07/27/2011. Patient has solid food dysphagia. Chronic GERD and heartburn well controlled with therapy. He is undergoing diagnostic/therapeutic EGD.

## 2011-08-20 ENCOUNTER — Encounter (INDEPENDENT_AMBULATORY_CARE_PROVIDER_SITE_OTHER): Payer: Self-pay | Admitting: *Deleted

## 2011-10-25 ENCOUNTER — Encounter: Payer: Self-pay | Admitting: Internal Medicine

## 2011-11-09 ENCOUNTER — Telehealth (INDEPENDENT_AMBULATORY_CARE_PROVIDER_SITE_OTHER): Payer: Self-pay | Admitting: *Deleted

## 2011-11-09 DIAGNOSIS — K219 Gastro-esophageal reflux disease without esophagitis: Secondary | ICD-10-CM

## 2011-11-09 MED ORDER — SUCRALFATE 1 G PO TABS: 1.0000 g | ORAL_TABLET | Freq: Four times a day (QID) | ORAL | Status: DC

## 2011-11-09 NOTE — Telephone Encounter (Signed)
[facility] has requested refill on Sucralfate 1 gm tablet, take 1 tablet by mouth 4 times daily. Take 30 minutes before meals and bedtime.

## 2011-12-30 ENCOUNTER — Other Ambulatory Visit: Payer: Self-pay | Admitting: Adult Health

## 2012-02-17 ENCOUNTER — Encounter (INDEPENDENT_AMBULATORY_CARE_PROVIDER_SITE_OTHER): Payer: Self-pay | Admitting: *Deleted

## 2012-03-01 ENCOUNTER — Ambulatory Visit (INDEPENDENT_AMBULATORY_CARE_PROVIDER_SITE_OTHER): Payer: BC Managed Care – PPO | Admitting: Internal Medicine

## 2012-03-07 ENCOUNTER — Emergency Department (HOSPITAL_COMMUNITY): Payer: BC Managed Care – PPO

## 2012-03-07 ENCOUNTER — Inpatient Hospital Stay (HOSPITAL_COMMUNITY)
Admission: EM | Admit: 2012-03-07 | Discharge: 2012-03-10 | DRG: 532 | Disposition: A | Discharge status: Home / Self Care | Payer: BC Managed Care – PPO | Attending: Internal Medicine | Admitting: Internal Medicine

## 2012-03-07 ENCOUNTER — Encounter (HOSPITAL_COMMUNITY): Payer: Self-pay | Admitting: Emergency Medicine

## 2012-03-07 ENCOUNTER — Inpatient Hospital Stay (HOSPITAL_COMMUNITY): Payer: BC Managed Care – PPO

## 2012-03-07 DIAGNOSIS — M545 Low back pain, unspecified: Secondary | ICD-10-CM | POA: Diagnosis present

## 2012-03-07 DIAGNOSIS — Z87891 Personal history of nicotine dependence: Secondary | ICD-10-CM

## 2012-03-07 DIAGNOSIS — R29898 Other symptoms and signs involving the musculoskeletal system: Secondary | ICD-10-CM | POA: Diagnosis present

## 2012-03-07 DIAGNOSIS — K208 Other esophagitis without bleeding: Secondary | ICD-10-CM | POA: Diagnosis present

## 2012-03-07 DIAGNOSIS — G8929 Other chronic pain: Secondary | ICD-10-CM | POA: Diagnosis present

## 2012-03-07 DIAGNOSIS — I5022 Chronic systolic (congestive) heart failure: Secondary | ICD-10-CM | POA: Diagnosis present

## 2012-03-07 DIAGNOSIS — R079 Chest pain, unspecified: Secondary | ICD-10-CM

## 2012-03-07 DIAGNOSIS — N189 Chronic kidney disease, unspecified: Secondary | ICD-10-CM | POA: Diagnosis present

## 2012-03-07 DIAGNOSIS — E781 Pure hyperglyceridemia: Secondary | ICD-10-CM | POA: Diagnosis present

## 2012-03-07 DIAGNOSIS — N179 Acute kidney failure, unspecified: Secondary | ICD-10-CM | POA: Diagnosis present

## 2012-03-07 DIAGNOSIS — D696 Thrombocytopenia, unspecified: Secondary | ICD-10-CM | POA: Diagnosis present

## 2012-03-07 DIAGNOSIS — I2589 Other forms of chronic ischemic heart disease: Secondary | ICD-10-CM | POA: Diagnosis present

## 2012-03-07 DIAGNOSIS — I2510 Atherosclerotic heart disease of native coronary artery without angina pectoris: Secondary | ICD-10-CM

## 2012-03-07 DIAGNOSIS — K219 Gastro-esophageal reflux disease without esophagitis: Secondary | ICD-10-CM | POA: Diagnosis present

## 2012-03-07 DIAGNOSIS — G459 Transient cerebral ischemic attack, unspecified: Principal | ICD-10-CM | POA: Diagnosis present

## 2012-03-07 DIAGNOSIS — I129 Hypertensive chronic kidney disease with stage 1 through stage 4 chronic kidney disease, or unspecified chronic kidney disease: Secondary | ICD-10-CM | POA: Diagnosis present

## 2012-03-07 DIAGNOSIS — M109 Gout, unspecified: Secondary | ICD-10-CM | POA: Diagnosis present

## 2012-03-07 DIAGNOSIS — R000 Tachycardia, unspecified: Secondary | ICD-10-CM

## 2012-03-07 DIAGNOSIS — E119 Type 2 diabetes mellitus without complications: Secondary | ICD-10-CM

## 2012-03-07 DIAGNOSIS — R4789 Other speech disturbances: Secondary | ICD-10-CM | POA: Diagnosis present

## 2012-03-07 DIAGNOSIS — Z95810 Presence of automatic (implantable) cardiac defibrillator: Secondary | ICD-10-CM

## 2012-03-07 DIAGNOSIS — Z79899 Other long term (current) drug therapy: Secondary | ICD-10-CM

## 2012-03-07 DIAGNOSIS — R259 Unspecified abnormal involuntary movements: Secondary | ICD-10-CM | POA: Diagnosis present

## 2012-03-07 DIAGNOSIS — Z7982 Long term (current) use of aspirin: Secondary | ICD-10-CM

## 2012-03-07 DIAGNOSIS — H538 Other visual disturbances: Secondary | ICD-10-CM | POA: Diagnosis present

## 2012-03-07 HISTORY — DX: Pre-excitation syndrome: I45.6

## 2012-03-07 HISTORY — DX: Chronic kidney disease, stage 2 (mild): N18.2

## 2012-03-07 HISTORY — DX: Chest pain, unspecified: R07.9

## 2012-03-07 HISTORY — DX: Other intervertebral disc degeneration, lumbar region without mention of lumbar back pain or lower extremity pain: M51.369

## 2012-03-07 HISTORY — DX: Other intervertebral disc degeneration, lumbar region: M51.36

## 2012-03-07 HISTORY — DX: Presence of automatic (implantable) cardiac defibrillator: Z95.810

## 2012-03-07 HISTORY — DX: Dilated cardiomyopathy: I42.0

## 2012-03-07 HISTORY — DX: Ulcer of esophagus without bleeding: K22.10

## 2012-03-07 HISTORY — DX: Unspecified hearing loss, unspecified ear: H91.90

## 2012-03-07 HISTORY — DX: Essential (primary) hypertension: I10

## 2012-03-07 LAB — CBC WITH DIFFERENTIAL/PLATELET
Basophils Absolute: 0.1 10*3/uL (ref 0.0–0.1)
Eosinophils Absolute: 0.1 10*3/uL (ref 0.0–0.7)
Hemoglobin: 17.2 g/dL — ABNORMAL HIGH (ref 13.0–17.0)
Lymphocytes Relative: 21 % (ref 12–46)
MCHC: 37.6 g/dL — ABNORMAL HIGH (ref 30.0–36.0)
Neutrophils Relative %: 68 % (ref 43–77)
RDW: 13.8 % (ref 11.5–15.5)

## 2012-03-07 LAB — CREATININE, URINE, RANDOM: Creatinine, Urine: 71.18 mg/dL

## 2012-03-07 LAB — CARDIAC PANEL(CRET KIN+CKTOT+MB+TROPI)
CK, MB: 5.5 ng/mL — ABNORMAL HIGH (ref 0.3–4.0)
Relative Index: 5.2 — ABNORMAL HIGH (ref 0.0–2.5)
Relative Index: 4.7 — ABNORMAL HIGH (ref 0.0–2.5)
Total CK: 116 U/L (ref 7–232)
Troponin I: <0.3 ng/mL (ref <0.30)

## 2012-03-07 LAB — URINALYSIS, MICROSCOPIC ONLY
Bilirubin Urine: NEGATIVE
Glucose, UA: 250 mg/dL — AB
Hgb urine dipstick: NEGATIVE
Ketones, ur: 15 mg/dL — AB
Nitrite: NEGATIVE
Protein, ur: NEGATIVE mg/dL

## 2012-03-07 LAB — COMPREHENSIVE METABOLIC PANEL
ALT: 53 U/L (ref 0–53)
AST: 34 U/L (ref 0–37)
Calcium: 9.8 mg/dL (ref 8.4–10.5)
GFR calc Af Amer: 66 mL/min — ABNORMAL LOW (ref >90)
Sodium: 134 mEq/L — ABNORMAL LOW (ref 135–145)
Total Protein: 6.9 g/dL (ref 6.0–8.3)

## 2012-03-07 LAB — GLUCOSE, CAPILLARY: Glucose-Capillary: 213 mg/dL — ABNORMAL HIGH (ref 70–99)

## 2012-03-07 LAB — RAPID URINE DRUG SCREEN, HOSP PERFORMED
Benzodiazepines: NOT DETECTED
Cocaine: NOT DETECTED
Opiates: POSITIVE — AB
Tetrahydrocannabinol: NOT DETECTED

## 2012-03-07 LAB — URIC ACID: Uric Acid, Serum: 5.5 mg/dL (ref 4.0–7.8)

## 2012-03-07 LAB — DIGOXIN LEVEL: Digoxin Level: 0.4 ng/mL — ABNORMAL LOW (ref 0.8–2.0)

## 2012-03-07 LAB — SODIUM, URINE, RANDOM: Sodium, Ur: 52 mEq/L

## 2012-03-07 LAB — PROTIME-INR: INR: 1.03 (ref 0.00–1.49)

## 2012-03-07 LAB — PRO B NATRIURETIC PEPTIDE: Pro B Natriuretic peptide (BNP): 99.3 pg/mL (ref 0–125)

## 2012-03-07 LAB — D-DIMER, QUANTITATIVE: D-Dimer, Quant: 0.32 ug/mL-FEU (ref 0.00–0.48)

## 2012-03-07 MED ORDER — PANTOPRAZOLE SODIUM 40 MG IV SOLR
40.0000 mg | Freq: Two times a day (BID) | INTRAVENOUS | Status: DC
  Administered 2012-03-07: 40 mg via INTRAVENOUS
  Filled 2012-03-07 (×3): qty 40

## 2012-03-07 MED ORDER — SODIUM CHLORIDE 0.9 % IV SOLN
INTRAVENOUS | Status: AC
  Administered 2012-03-07: 20:00:00 via INTRAVENOUS

## 2012-03-07 MED ORDER — OXYCODONE HCL 5 MG PO TABS: 5.0000 mg | ORAL_TABLET | Freq: Four times a day (QID) | ORAL | Status: DC | PRN

## 2012-03-07 MED ORDER — SUCRALFATE 1 G PO TABS
1.0000 g | ORAL_TABLET | Freq: Four times a day (QID) | ORAL | Status: DC
  Administered 2012-03-07 – 2012-03-10 (×11): 1 g via ORAL
  Filled 2012-03-07 (×14): qty 1

## 2012-03-07 MED ORDER — ALLOPURINOL 300 MG PO TABS
300.0000 mg | ORAL_TABLET | Freq: Every day | ORAL | Status: DC
  Administered 2012-03-08 – 2012-03-10 (×3): 300 mg via ORAL
  Filled 2012-03-07 (×3): qty 1

## 2012-03-07 MED ORDER — HYDROMORPHONE HCL PF 1 MG/ML IJ SOLN
1.0000 mg | Freq: Once | INTRAMUSCULAR | Status: AC
  Administered 2012-03-07: 1 mg via INTRAVENOUS
  Filled 2012-03-07: qty 1

## 2012-03-07 MED ORDER — MORPHINE SULFATE 2 MG/ML IJ SOLN
2.0000 mg | INTRAMUSCULAR | Status: DC | PRN
  Administered 2012-03-07: 2 mg via INTRAVENOUS
  Filled 2012-03-07: qty 1

## 2012-03-07 MED ORDER — INSULIN ASPART 100 UNIT/ML ~~LOC~~ SOLN
0.0000 [IU] | Freq: Three times a day (TID) | SUBCUTANEOUS | Status: DC
  Administered 2012-03-08: 1 [IU] via SUBCUTANEOUS
  Administered 2012-03-08: 2 [IU] via SUBCUTANEOUS
  Administered 2012-03-08: 3 [IU] via SUBCUTANEOUS
  Administered 2012-03-09: 2 [IU] via SUBCUTANEOUS
  Administered 2012-03-09 (×2): 3 [IU] via SUBCUTANEOUS
  Administered 2012-03-10: 2 [IU] via SUBCUTANEOUS
  Administered 2012-03-10: 3 [IU] via SUBCUTANEOUS

## 2012-03-07 MED ORDER — SODIUM CHLORIDE 0.9 % IV BOLUS (SEPSIS)
1000.0000 mL | Freq: Once | INTRAVENOUS | Status: AC
  Administered 2012-03-07: 1000 mL via INTRAVENOUS

## 2012-03-07 MED ORDER — IOHEXOL 350 MG/ML SOLN
80.0000 mL | Freq: Once | INTRAVENOUS | Status: AC | PRN
  Administered 2012-03-07: 80 mL via INTRAVENOUS

## 2012-03-07 MED ORDER — OXYCODONE-ACETAMINOPHEN 5-325 MG PO TABS
1.0000 | ORAL_TABLET | Freq: Four times a day (QID) | ORAL | Status: DC | PRN
  Administered 2012-03-08 – 2012-03-09 (×4): 1 via ORAL
  Filled 2012-03-07 (×4): qty 1

## 2012-03-07 MED ORDER — OXYCODONE-ACETAMINOPHEN 10-325 MG PO TABS: 1.0000 | ORAL_TABLET | Freq: Four times a day (QID) | ORAL | Status: DC | PRN

## 2012-03-07 MED ORDER — MORPHINE SULFATE 4 MG/ML IJ SOLN
4.0000 mg | Freq: Once | INTRAMUSCULAR | Status: AC
  Administered 2012-03-07: 4 mg via INTRAVENOUS
  Filled 2012-03-07: qty 1

## 2012-03-07 MED ORDER — NITROGLYCERIN IN D5W 200-5 MCG/ML-% IV SOLN
5.0000 ug/min | INTRAVENOUS | Status: DC
  Administered 2012-03-07: 5 ug/min via INTRAVENOUS
  Filled 2012-03-07: qty 250

## 2012-03-07 MED ORDER — CARVEDILOL 25 MG PO TABS
25.0000 mg | ORAL_TABLET | Freq: Two times a day (BID) | ORAL | Status: DC
  Administered 2012-03-08 – 2012-03-10 (×5): 25 mg via ORAL
  Filled 2012-03-07 (×7): qty 1

## 2012-03-07 MED ORDER — LORAZEPAM 2 MG/ML IJ SOLN
1.0000 mg | Freq: Once | INTRAMUSCULAR | Status: AC
  Administered 2012-03-07: 1 mg via INTRAVENOUS
  Filled 2012-03-07: qty 1

## 2012-03-07 MED ORDER — HEPARIN SODIUM (PORCINE) 5000 UNIT/ML IJ SOLN
5000.0000 [IU] | Freq: Three times a day (TID) | INTRAMUSCULAR | Status: DC
  Administered 2012-03-07 – 2012-03-10 (×9): 5000 [IU] via SUBCUTANEOUS
  Filled 2012-03-07 (×11): qty 1

## 2012-03-07 MED ORDER — ASPIRIN 81 MG PO CHEW
81.0000 mg | CHEWABLE_TABLET | Freq: Every day | ORAL | Status: DC
  Administered 2012-03-08 – 2012-03-09 (×2): 81 mg via ORAL
  Filled 2012-03-07 (×2): qty 1

## 2012-03-07 MED ORDER — SODIUM CHLORIDE 0.9 % IJ SOLN
3.0000 mL | Freq: Two times a day (BID) | INTRAMUSCULAR | Status: DC
  Administered 2012-03-07 – 2012-03-10 (×6): 3 mL via INTRAVENOUS

## 2012-03-07 MED ORDER — ONDANSETRON HCL 4 MG/2ML IJ SOLN
4.0000 mg | Freq: Once | INTRAMUSCULAR | Status: AC
  Administered 2012-03-07: 4 mg via INTRAVENOUS
  Filled 2012-03-07: qty 2

## 2012-03-07 NOTE — Consult Note (Signed)
HPI: 51 year old male with past medical history of nonischemic cardiomyopathy improved, chronic chest pain, diabetes mellitus, hypertension, prior ICD and renal insufficiency for evaluation of chest pain. Last cardiac catheterization in March of 2009 showed normal coronary arteries. Most recent echocardiogram in August of 2012 showed an ejection fraction of 50-55%. There was grade 1 diastolic dysfunction. There was mild left atrial enlargement and mild mitral regurgitation. The patient apparently has had intermittent chest pain since 1995. He presented today with complaints of chest pain that was continuous for the past 3 days. It is described as a heaviness and increases with lying flat and deep inspiration. It has not completely resolved in the past 3 days. There is associated diaphoresis and shortness of breath but no nausea. The pain does not radiate. His pain became more severe this morning. He also apparently had convulsions by his wife's report. The patient does not recall that. He has been admitted and cardiology was asked to evaluate.  Medications Prior to Admission  Medication Sig Dispense Refill  . allopurinol (ZYLOPRIM) 300 MG tablet Take 300 mg by mouth daily.       Marland Kitchen aspirin 81 MG chewable tablet Chew 81 mg by mouth daily.      . carvedilol (COREG) 25 MG tablet Take 25 mg by mouth 2 (two) times daily with a meal.      . digoxin (LANOXIN) 0.125 MG tablet Take 125 mcg by mouth daily.       Marland Kitchen lisinopril (PRINIVIL,ZESTRIL) 20 MG tablet Take 20 mg by mouth daily.      Marland Kitchen oxyCODONE-acetaminophen (PERCOCET) 10-325 MG per tablet Take 1 tablet by mouth every 6 (six) hours as needed. For pain      . pantoprazole (PROTONIX) 40 MG tablet Take 40 mg by mouth 2 (two) times daily.       . sucralfate (CARAFATE) 1 G tablet Take 1 g by mouth 4 (four) times daily.        No Known Allergies  Past Medical History  Diagnosis Date  . Diabetes mellitus DX: 2010  . Gout   . Hypertension   . CKD (chronic  kidney disease), stage II   . Erosive esophagitis     a. per EGD (08/2011), Dr. Karilyn Cota - Erosive reflux esophagitis improved but not completely healed since previous EGD 3 years ago. Bx showing  ulcerated gatroesophageal junction mucosa. negative for H. pylori  . Nonischemic dilated cardiomyopathy     a. H/O EF as low as 35-40% by LV gram 10/2007;  b. Echo 02/2011 EF 50-55%, inf HK, Gr 1 DD   . DDD (degenerative disc disease), lumbar   . Hearing deficit     a. wear bilateral hearing aides  . AICD (automatic cardioverter/defibrillator) present 11/2007    a. 11/2007 [facility] Current VR - single lead ICD  . Chest pain     a. 10/2007 Cath:  normal Cors.  . WPW (Wolff-Parkinson-White syndrome)     a. s/p RFCA @ [facility] - 1999    Past Surgical History  Procedure Date  . Back surgery     1995  . Appendectomy   . Cardiac defibrillator placement   . Cholecystectomy   . Ablation for svt     at [facility]  . Elbow surgery 2011, nov  . Cholecystectomy, laparoscopic     11/2007    History   Social History  . Marital Status: Married    Spouse Name: N/A    Number of Children: N/A  .  Years of Education: N/A   Occupational History  . Worked for [employer] off in 11/11   Social History Main Topics  . Smoking status: Former Smoker -- 60 years    Types: Cigarettes    Quit date: 07/30/2010  . Smokeless tobacco: Never Used  . Alcohol Use: 0.6 oz/week    1 Cans of beer per week  . Drug Use: No  . Sexually Active: Not on file   Other Topics Concern  . Not on file   Social History Narrative  . No narrative on file    Family History  Problem Relation Age of Onset  . Heart attack Mother 18  . Heart attack Father 62  . Heart attack Brother 55  . Hypertension Mother   . Hypertension Father   . Hypertension Brother   . Stroke Maternal Grandmother 80  . Diabetes Mother   . Diabetes Father   . Diabetes Brother     ROS:  Chronic back pain but no fevers or chills, productive  cough, hemoptysis, dysphasia, odynophagia, melena, hematochezia, dysuria, hematuria, rash, seizure activity, orthopnea, PND, pedal edema, claudication. Remaining systems are negative.  Physical Exam:   Blood pressure 124/79, pulse 93, temperature 97.3 F (36.3 C), temperature source Oral, resp. rate 18, height 6\' 2"  (1.88 m), weight 104.9 kg (231 lb 4.2 oz), SpO2 100.00%.  General:  Well developed/well nourished in NAD Skin warm/dry, tattoos noted Patient not depressed No peripheral clubbing Back-normal HEENT-normal/normal eyelids Neck supple/normal carotid upstroke bilaterally; no bruits; no JVD; no thyromegaly chest - CTA/ normal expansion, chest pain reproduced with palpation CV - RRR/normal S1 and S2; no murmurs, rubs or gallops;  PMI nondisplaced Abdomen -NT/ND, no HSM, no mass, + bowel sounds, no bruit 2+ femoral pulses, no bruits Ext-no edema, chords, 2+ DP Neuro-grossly nonfocal  ECG sinus rhythm with no ST changes.  Results for orders placed during the hospital encounter of 03/07/12 (from the past 48 hour(s))  CBC WITH DIFFERENTIAL     Status: Abnormal   Collection Time   03/07/12  1:32 PM      Component Value Range Comment   WBC 5.8  4.0 - 10.5 K/uL    RBC 5.38  4.22 - 5.81 MIL/uL    Hemoglobin 17.2 (*) 13.0 - 17.0 g/dL    HCT 16.1  09.6 - 04.5 %    MCV 84.9  78.0 - 100.0 fL    MCH 32.0  26.0 - 34.0 pg    MCHC 37.6 (*) 30.0 - 36.0 g/dL RULED OUT INTERFERING SUBSTANCES   RDW 13.8  11.5 - 15.5 %    Platelets 127 (*) 150 - 400 K/uL    Neutrophils Relative 68  43 - 77 %    Lymphocytes Relative 21  12 - 46 %    Monocytes Relative 9  3 - 12 %    Eosinophils Relative 1  0 - 5 %    Basophils Relative 1  0 - 1 %    Neutro Abs 3.9  1.7 - 7.7 K/uL    Lymphs Abs 1.2  0.7 - 4.0 K/uL    Monocytes Absolute 0.5  0.1 - 1.0 K/uL    Eosinophils Absolute 0.1  0.0 - 0.7 K/uL    Basophils Absolute 0.1  0.0 - 0.1 K/uL    RBC Morphology POLYCHROMASIA PRESENT   TEARDROP CELLS   Smear  Review LARGE PLATELETS PRESENT     COMPREHENSIVE METABOLIC PANEL  Status: Abnormal   Collection Time   03/07/12  1:32 PM      Component Value Range Comment   Sodium 134 (*) 135 - 145 mEq/L    Potassium 3.8  3.5 - 5.1 mEq/L    Chloride 97  96 - 112 mEq/L    CO2 20  19 - 32 mEq/L    Glucose, Bld 208 (*) 70 - 99 mg/dL    BUN 11  6 - 23 mg/dL    Creatinine, Ser 1.61 (*) 0.50 - 1.35 mg/dL    Calcium 9.8  8.4 - 09.6 mg/dL    Total Protein 6.9  6.0 - 8.3 g/dL    Albumin 4.5  3.5 - 5.2 g/dL    AST 34  0 - 37 U/L    ALT 53  0 - 53 U/L    Alkaline Phosphatase 104  39 - 117 U/L    Total Bilirubin 1.0  0.3 - 1.2 mg/dL    GFR calc non Af Amer 57 (*) >90 mL/min    GFR calc Af Amer 66 (*) >90 mL/min   PROTIME-INR     Status: Normal   Collection Time   03/07/12  1:32 PM      Component Value Range Comment   Prothrombin Time 13.7  11.6 - 15.2 seconds    INR 1.03  0.00 - 1.49   D-DIMER, QUANTITATIVE     Status: Normal   Collection Time   03/07/12  1:32 PM      Component Value Range Comment   D-Dimer, Quant 0.32  0.00 - 0.48 ug/mL-FEU   CARDIAC PANEL(CRET KIN+CKTOT+MB+TROPI)     Status: Abnormal   Collection Time   03/07/12  1:33 PM      Component Value Range Comment   Total CK 126  7 - 232 U/L    CK, MB 6.6 (*) 0.3 - 4.0 ng/mL    Troponin I <0.30  <0.30 ng/mL    Relative Index 5.2 (*) 0.0 - 2.5   PRO B NATRIURETIC PEPTIDE     Status: Normal   Collection Time   03/07/12  1:33 PM      Component Value Range Comment   Pro B Natriuretic peptide (BNP) 99.3  0 - 125 pg/mL   LIPASE, BLOOD     Status: Normal   Collection Time   03/07/12  3:37 PM      Component Value Range Comment   Lipase 22  11 - 59 U/L   URINE RAPID DRUG SCREEN (HOSP PERFORMED)     Status: Abnormal   Collection Time   03/07/12  4:34 PM      Component Value Range Comment   Opiates POSITIVE (*) NONE DETECTED    Cocaine NONE DETECTED  NONE DETECTED    Benzodiazepines NONE DETECTED  NONE DETECTED    Amphetamines NONE DETECTED   NONE DETECTED    Tetrahydrocannabinol NONE DETECTED  NONE DETECTED    Barbiturates NONE DETECTED  NONE DETECTED   URINALYSIS, WITH MICROSCOPIC     Status: Abnormal   Collection Time   03/07/12  4:34 PM      Component Value Range Comment   Color, Urine YELLOW  YELLOW    APPearance CLEAR  CLEAR    Specific Gravity, Urine 1.021  1.005 - 1.030    pH 7.0  5.0 - 8.0    Glucose, UA 250 (*) NEGATIVE mg/dL    Hgb urine dipstick NEGATIVE  NEGATIVE    Bilirubin Urine NEGATIVE  NEGATIVE    Ketones, ur 15 (*) NEGATIVE mg/dL    Protein, ur NEGATIVE  NEGATIVE mg/dL    Urobilinogen, UA 0.2  0.0 - 1.0 mg/dL    Nitrite NEGATIVE  NEGATIVE    Leukocytes, UA NEGATIVE  NEGATIVE    WBC, UA 0-2  <3 WBC/hpf    Squamous Epithelial / LPF RARE  RARE    Casts HYALINE CASTS (*) NEGATIVE   SODIUM, URINE, RANDOM     Status: Normal   Collection Time   03/07/12  4:34 PM      Component Value Range Comment   Sodium, Ur 52     CREATININE, URINE, RANDOM     Status: Normal   Collection Time   03/07/12  4:34 PM      Component Value Range Comment   Creatinine, Urine 71.18     URIC ACID     Status: Normal   Collection Time   03/07/12  5:00 PM      Component Value Range Comment   Uric Acid, Serum 5.5  4.0 - 7.8 mg/dL     Ct Head Wo Contrast  03/07/2012  *RADIOLOGY REPORT*  Clinical Data: Upper extremity weakness.  Memory loss.  Confusion.  CT HEAD WITHOUT CONTRAST  Technique:  Contiguous axial images were obtained from the base of the skull through the vertex without contrast.  Comparison: None  Findings: The study is performed following contrast enhanced exam of the chest.  There is residual contrast in the blood vessels.  There is no intra or extra-axial fluid collection or mass lesion. The basilar cisterns and ventricles have a normal appearance. There is no CT evidence for acute infarction or hemorrhage.  Bone windows show no calvarial fracture.  The visualized paranasal sinuses are well-aerated.  IMPRESSION: No  evidence for acute  abnormality.  Original Report Authenticated By: Patterson Hammersmith, M.D.   Dg Chest Port 1 View  03/07/2012  *RADIOLOGY REPORT*  Clinical Data: Severe chest pain.  Shortness of breath.  Ex-smoker. Diabetes.  Hypertension.  PORTABLE CHEST - 1 VIEW  Comparison: 03/30/2011.  Findings: The heart remains normal in size.  The patient is currently rotated to the left.  The patient's chin is obscuring the medial right lung apex.  The visualized lungs are clear.  The interstitial markings are mildly prominent without Kerley lines. No pleural fluid.  Thoracic spine degenerative changes.  IMPRESSION: Mild chronic interstitial lung disease.  No acute abnormality.  Original Report Authenticated By: Darrol Angel, M.D.   Ct Angio Chest Aortic Dissect W &/or W/o  03/07/2012  *RADIOLOGY REPORT*  Clinical Data:  Left chest pain and shortness of breath.  Clinical concern for aortic dissection.  CT ANGIOGRAPHY CHEST WITHOUT AND WITH CONTRAST  Technique:  Multidetector CT imaging of the chest was performed using the standard protocol before and during bolus administration of intravenous contrast.  Multiplanar CT image reconstructions including MIPs were obtained to evaluate the vascular anatomy.  Contrast: 80mL OMNIPAQUE IOHEXOL 350 MG/ML SOLN  Comparison:  Portable chest obtained earlier today.  Findings:  Minimal atheromatous change in the thoracic aorta without aneurysm or dissection.  A left subclavian pacemaker lead is demonstrated with its tip at the right ventricular apex.  The lungs are clear.  No lung masses or enlarged lymph nodes.  The pulmonary arteries are normally opacified with no pulmonary arterial filling defects seen.  Thoracic spine degenerative changes.  The upper abdomen is unremarkable.  Review of the MIP images confirms the above  findings.  IMPRESSION: No aortic dissection, pulmonary embolism or other acute abnormality.  Original Report Authenticated By: Darrol Angel, M.D.     Assessment/Plan #1-chest pain-the patient's symptoms are not consistent with cardiac etiology. His initial cardiac enzymes are negative and his electrocardiogram shows no ST changes. D-dimer is normal. Chest CT showed no aortic dissection, pulmonary embolism or other acute abnormality. His pain is reproduced with palpation. If followup enzymes negative no plans for further cardiac evaluation and patient can be discharged and followup with Dr. Dietrich Pates in [place]. #2-dyspnea-patient is not volume overloaded on examination and BNP normal. Chest CT showed no pulmonary embolus. #3-prior ICD-patient has not had his device interrogated. This can be performed tomorrow prior to discharge. #4-convulsions-head CT is negative. Further evaluation per primary care. Olga Millers MD 03/07/2012, 8:20 PM

## 2012-03-07 NOTE — ED Notes (Signed)
Pt with left sided chest pain for the last 3 days worse today, accompanied with SOB. Received 324mg  ASA. 3 SL nitro with pain relief from 8/10 to 4/10 on arrival. 18g LAC.

## 2012-03-07 NOTE — Progress Notes (Signed)
Rt consulted with patient about CPAP order. Patient stated that he had a machine in his home. He stated that he had tried multiple times to wear the machine and that he really did not like it. I ask if he would like to try a setup just to see if our felt different for him and he said that he would rather not. RT advised patient of the importance and patient said he understood. Rt explained to the patient that if he did change his mind and want to try the CPAP that I would come back and set a device up. Patient and wife stated that they understood and would contact me if there was a change.

## 2012-03-07 NOTE — ED Provider Notes (Signed)
History     CSN: [id number]  Arrival date & time 03/07/12  1314   First MD Initiated Contact with Patient 03/07/12 1316      Chief Complaint  Patient presents with  . Chest Pain    (Consider location/radiation/quality/duration/timing/severity/associated sxs/prior treatment) Patient is a 51 y.o. male presenting with chest pain. The history is provided by the patient.  Chest Pain Episode onset: 3 days ago. Chest pain occurs intermittently. The chest pain is worsening. Associated with: maybe worse with breathing but not eating. At its most intense, the pain is at 10/10. The pain is currently at 10/10. The severity of the pain is severe. The quality of the pain is described as pressure-like, squeezing and stabbing. Radiates to: left sternum to left chest. Chest pain is worsened by deep breathing. Primary symptoms include shortness of breath. Pertinent negatives for primary symptoms include no fever, no cough, no wheezing, no nausea and no vomiting. Primary symptoms comment: possible syncope today.  pt is unclear what happened earlier today but states he does not remember  Pertinent negatives for associated symptoms include no diaphoresis. He tried nitroglycerin for the symptoms.  His past medical history is significant for diabetes, hypertension and MI.  Procedure history is positive for cardiac catheterization.     Past Medical History  Diagnosis Date  . Diabetes mellitus     x 8 months  . Gout   . Myocardial infarct, old     One last year.  He has a defibrillatory in March 2009  . Hypertension     Past Surgical History  Procedure Date  . Back surgery     1995  . Appendectomy   . Cardiac defibrillator placement   . Cholecystectomy   . Ablation for svt     at [facility]  . Elbow surgery 2011, nov    History reviewed. No pertinent family history.  History  Substance Use Topics  . Smoking status: Former Smoker    Quit date: 07/30/2010  . Smokeless tobacco: Never Used    . Alcohol Use: No      Review of Systems  Constitutional: Negative for fever and diaphoresis.  Respiratory: Positive for shortness of breath. Negative for cough and wheezing.   Cardiovascular: Positive for chest pain.  Gastrointestinal: Negative for nausea and vomiting.  All other systems reviewed and are negative.    Allergies  Review of patient's allergies indicates no known allergies.  Home Medications   Current Outpatient Rx  Name Route Sig Dispense Refill  . ALLOPURINOL 300 MG PO TABS Oral Take 300 mg by mouth daily.     . ASPIRIN 81 MG PO CHEW Oral Chew 81 mg by mouth daily.    Marland Kitchen CARVEDILOL 25 MG PO TABS Oral Take 25 mg by mouth 2 (two) times daily with a meal.    . DIGOXIN 0.125 MG PO TABS Oral Take 125 mcg by mouth daily.     Marland Kitchen LISINOPRIL 20 MG PO TABS Oral Take 20 mg by mouth daily.    . OXYCODONE HCL 15 MG PO TABS Oral Take 15 mg by mouth Three times daily as needed. For back nerve pain    . OXYCODONE-ACETAMINOPHEN 10-325 MG PO TABS Oral Take 1 tablet by mouth every 6 (six) hours as needed. For pain    . PANTOPRAZOLE SODIUM 40 MG PO TBEC Oral Take 40 mg by mouth 2 (two) times daily.     . SUCRALFATE 1 G PO TABS Oral Take 1 g by  mouth 4 (four) times daily.      BP 136/69  Pulse 126  Temp 98 F (36.7 C) (Oral)  Resp 26  SpO2 99%  Physical Exam  Nursing note and vitals reviewed. Constitutional: He is oriented to person, place, and time. He appears well-developed and well-nourished. He appears distressed.  HENT:  Head: Normocephalic and atraumatic.  Mouth/Throat: Oropharynx is clear and moist.  Eyes: Conjunctivae and EOM are normal. Pupils are equal, round, and reactive to light.  Neck: Normal range of motion. Neck supple.  Cardiovascular: Regular rhythm and intact distal pulses.  Tachycardia present.   No murmur heard. Pulmonary/Chest: Effort normal and breath sounds normal. No respiratory distress. He has no wheezes. He has no rales. He exhibits tenderness.  He exhibits no crepitus and no deformity.    Abdominal: Soft. He exhibits no distension. There is tenderness in the epigastric area. There is no rebound and no guarding.  Musculoskeletal: Normal range of motion. He exhibits no edema and no tenderness.  Neurological: He is alert and oriented to person, place, and time.  Skin: Skin is warm and dry. No rash noted. No erythema.  Psychiatric: He has a normal mood and affect. His behavior is normal.    ED Course  Procedures (including critical care time)  Labs Reviewed  COMPREHENSIVE METABOLIC PANEL - Abnormal; Notable for the following:    Sodium 134 (*)     Glucose, Bld 208 (*)     Creatinine, Ser 1.39 (*)     GFR calc non Af Amer 57 (*)     GFR calc Af Amer 66 (*)     All other components within normal limits  CARDIAC PANEL(CRET KIN+CKTOT+MB+TROPI) - Abnormal; Notable for the following:    CK, MB 6.6 (*)     Relative Index 5.2 (*)     All other components within normal limits  PROTIME-INR  D-DIMER, QUANTITATIVE  CBC WITH DIFFERENTIAL   Dg Chest Port 1 View  03/07/2012  *RADIOLOGY REPORT*  Clinical Data: Severe chest pain.  Shortness of breath.  Ex-smoker. Diabetes.  Hypertension.  PORTABLE CHEST - 1 VIEW  Comparison: 03/30/2011.  Findings: The heart remains normal in size.  The patient is currently rotated to the left.  The patient's chin is obscuring the medial right lung apex.  The visualized lungs are clear.  The interstitial markings are mildly prominent without Kerley lines. No pleural fluid.  Thoracic spine degenerative changes.  IMPRESSION: Mild chronic interstitial lung disease.  No acute abnormality.  Original Report Authenticated By: Darrol Angel, M.D.   Ct Angio Chest Aortic Dissect W &/or W/o  03/07/2012  *RADIOLOGY REPORT*  Clinical Data:  Left chest pain and shortness of breath.  Clinical concern for aortic dissection.  CT ANGIOGRAPHY CHEST WITHOUT AND WITH CONTRAST  Technique:  Multidetector CT imaging of the chest was  performed using the standard protocol before and during bolus administration of intravenous contrast.  Multiplanar CT image reconstructions including MIPs were obtained to evaluate the vascular anatomy.  Contrast: 80mL OMNIPAQUE IOHEXOL 350 MG/ML SOLN  Comparison:  Portable chest obtained earlier today.  Findings:  Minimal atheromatous change in the thoracic aorta without aneurysm or dissection.  A left subclavian pacemaker lead is demonstrated with its tip at the right ventricular apex.  The lungs are clear.  No lung masses or enlarged lymph nodes.  The pulmonary arteries are normally opacified with no pulmonary arterial filling defects seen.  Thoracic spine degenerative changes.  The upper abdomen is unremarkable.  Review of the MIP images confirms the above findings.  IMPRESSION: No aortic dissection, pulmonary embolism or other acute abnormality.  Original Report Authenticated By: Darrol Angel, M.D.     Date: 03/07/2012  Rate: 127  Rhythm: sinus tachycardia  QRS Axis: normal  Intervals: normal  ST/T Wave abnormalities: normal  Conduction Disutrbances:none  Narrative Interpretation:   Old EKG Reviewed: unchanged   1. Chest pain   2. Tachycardia       MDM   Patient presenting complaining of chest pain and shortness of breath. He states the pain has been intermittent for the last 3 days. Patient states that he has a defibrillator but has never had a stent placed. He is not currently taking all of his medications due to the inability to afford them. Patient is having pleuritic left-sided chest pain that radiates into the left chest. He denies any alcohol use or repeat recent tobacco use. Patient does have a history of SVT however today his EKG shows a normal sinus tachycardia. Patient states his pain went from a 10 to an 8 after nitroglycerin given by EMS. The patient or he received 325 of aspirin by EMS. Patient was placed on a nitroglycerin drip and given morphine also for his pain. CBC,  CMP, cardiac enzymes, PT INR, d-dimer pending.  2:36 PM Patient's labs are all relatively unremarkable. However on repeat evaluation he still complaining of severe pressure and pain in his chest that was not relieved with nitroglycerin and had some relief with IV narcotics. Patient still appears very uncomfortable. He had a normal d-dimer a low suspicion for pulmonary embolism at this time however feel that he needs a CT of his chest to rule out dissection. Patient does have a long-standing history of GERD and esophagitis with prior esophageal stretch and this could be the cause for his pain however given his persistent tachycardia and discomfort feel that dissection needs to be ruled out.  4:10 PM CT chest neg.  Still tachy with pain and will admit for r/o and pain control.   Gwyneth Sprout, MD 03/07/12 1610

## 2012-03-07 NOTE — H&P (Signed)
Resident Addendum to Medical Student Admission H&P   I have seen and examined the patient myself, and I have reviewed the note by Baltazar Apo, MS IV and was present during the interview and physical exam.  Please see below for findings, assessment, and plan.   CC: chest pain  HPI: Pt is a 51 y.o., male with a PMHx of diet controlled DMII, HTN, nonischemic cardiomyopathy with last LV EF 55% (previously as low as 35%) and grade 1 diastolic dysfunction, history of WPW s/p AICD placement who presented to [facility] for evaluation of 3 day history of chest pain. Patient describes that since 03/04/2012, he has experienced intermittent episodes of left-sided pressure-like chest pain, with radiation to BL arms. No specific aggravating factors (such as rest, deep breathing, activity). He has used Alka-selzter for the pain, which initially improved the symptoms - however, has been ineffective today. As well, the patient used his nitroglycerin SL tablet, which have also been ineffective today. Patient describes that since the time of onset, the severity of chest pain symptoms have progressively worsened, with the pain peaking on the day of admission. A its worst, the pain was a 10/10, and currently rated a 4/10. During the pain episodes, the patient noted worsened of BL arm does not experience any nausea, vomiting, diaphoresis, shortness of breath. Given the severity of pain symptoms, the patient had an unexplained episode of diffuse body shaking this morning, with some blurriness of vision, and slurred speak that was witnessed by his wife - and since resolved. No tongue biting, incontinence of urine or stool during this episode. The patient is not able to recall the events of this episode. Secondary to the severity of symptoms, the patient's family finally urged him to be evaluated at [facility]. He otherwise denies recent increased activity, direct trauma, falls, cough, congestion, fevers, chills, vomiting, nausea, long  travel.   Of note, the patient has previously had multiple chest pain symptoms, prompting multiple cardiac workups. Last cardiac catheterization was in 10/2007, showing normal coronaries at that time. The patient states that the severity of current pain is worse than that previously experienced. The patient last AICD interrogation was approximately 8-12 months ago, and has not followed up since that time. Otherwise, the patient has multiple additional complaints that seem to be a chronically progressing over past several months, and undergoing evaluation as an outpatient. Specifically, noting DOE that has continued to progress, ongoing fatigue, left arm weakness.    Home Medications: Medication Sig  . allopurinol (ZYLOPRIM) 300 MG tablet Take 300 mg by mouth daily.   Marland Kitchen aspirin 81 MG chewable tablet Chew 81 mg by mouth daily.  . carvedilol (COREG) 25 MG tablet Take 25 mg by mouth 2 (two) times daily with a meal.  . digoxin (LANOXIN) 0.125 MG tablet Take 125 mcg by mouth daily.   Marland Kitchen lisinopril (PRINIVIL,ZESTRIL) 20 MG tablet Take 20 mg by mouth daily.  Marland Kitchen oxyCODONE-acetaminophen (PERCOCET) 10-325 MG per tablet Take 1 tablet by mouth every 6 (six) hours as needed. For pain  . pantoprazole (PROTONIX) 40 MG tablet Take 40 mg by mouth 2 (two) times daily.   . sucralfate (CARAFATE) 1 G tablet Take 1 g by mouth 4 (four) times daily.     Allergies: No Known Allergies   Medical History: Reviewed   Surgical History: Reviewed   Social History: Reviewed   Family History: Reviewed    Review of Systems: Constitutional:  denies fever, chills, diaphoresis Admits to fatigue.  HEENT: admits  to blurry vision. Denieshearing loss, ear pain, congestion, sore throat, rhinorrhea.  Respiratory: admits to DOE, chronic and unchanged 2-pillow orthopnea.  Denies cough, chest tightness, and wheezing.  Cardiovascular: admits to chest pain, palpitations Denies leg swelling.  Gastrointestinal: denies nausea,  vomiting, abdominal pain, diarrhea, constipation, blood in stool.  Genitourinary: admits to dribbling, incomplete urination, occasional dysuria. Denies urgency, frequency, hematuria.  Musculoskeletal: admits to chronic back pain.  Denies myalgias, joint swelling, arthralgias and gait problem.   Skin: denies pallor, rash and wound.  Neurological: admits to "convulsions", chronic numbness of fingers.  Denies dizziness, syncope, light-headedness and headaches.     Vital Signs: Blood pressure 124/79, pulse 93, temperature 97.3 F (36.3 C), temperature source Oral, resp. rate 18, height 6\' 2"  (1.88 m), weight 231 lb 4.2 oz (104.9 kg), SpO2 100.00%.  Physical Exam: General: Vital signs reviewed and noted. Well-developed, well-nourished, in no acute distress; alert, appropriate and cooperative throughout examination.  Head: Normocephalic, atraumatic.  Eyes: PERRL, EOMI, No signs of anemia or jaundince.  Nose: Mucous membranes moist, not inflammed, nonerythematous.  Throat: Oropharynx nonerythematous, mild dry oropharynx, no exudate appreciated.   Neck: No deformities, masses, or tenderness noted.Supple,  no JVD.  Lungs:  Normal respiratory effort. Clear to auscultation BL without crackles or wheezes.  Heart: Regular rhythm, tachycardic. S1 and S2 normal without gallop, murmur, or rubs.  Abdomen:  BS normoactive. Soft, Nondistended, non-tender.  No masses or organomegaly.  Extremities: No pretibial edema.  Neurologic: A&O X3, CN II - XII are grossly intact. Motor strength is 5/5 in the all 4 extremities, Sensations intact to light touch, Cerebellar signs negative.   Lab results Basic Metabolic Panel:  Basename 03/07/12 1332  NA 134*  K 3.8  CL 97  CO2 20  GLUCOSE 208*  BUN 11  CREATININE 1.39*  CALCIUM 9.8  MG --  PHOS --    Liver Function Tests:  Basename 03/07/12 1332  AST 34  ALT 53  ALKPHOS 104  BILITOT 1.0  PROT 6.9  ALBUMIN 4.5    Basename 03/07/12 1537  LIPASE 22   AMYLASE --    CBC:    Component Value Date/Time   WBC 5.8 03/07/2012 1332   HGB 17.2* 03/07/2012 1332   HCT 45.7 03/07/2012 1332   PLT 127* 03/07/2012 1332   MCV 84.9 03/07/2012 1332   NEUTROABS 3.9 03/07/2012 1332   LYMPHSABS 1.2 03/07/2012 1332   MONOABS 0.5 03/07/2012 1332   EOSABS 0.1 03/07/2012 1332   BASOSABS 0.1 03/07/2012 1332     Cardiac Enzymes:  Basename 03/07/12 1333  CKTOTAL 126  CKMB 6.6*  CKMBINDEX --  TROPONINI <0.30    BNP:  Basename 03/07/12 1333  PROBNP 99.3    D-Dimer:  Basename 03/07/12 1332  DDIMER 0.32    Coagulation:  Basename 03/07/12 1332  LABPROT 13.7  INR 1.03   Urine Drug Screen: Drugs of Abuse     Component Value Date/Time   LABOPIA POSITIVE* 03/07/2012 1634   LABOPIA NEGATIVE 10/26/2007 0425   COCAINSCRNUR NONE DETECTED 03/07/2012 1634   COCAINSCRNUR NEGATIVE 10/26/2007 0425   LABBENZ NONE DETECTED 03/07/2012 1634   LABBENZ NEGATIVE 10/26/2007 0425   AMPHETMU NONE DETECTED 03/07/2012 1634   AMPHETMU NEGATIVE 10/26/2007 0425   THCU NONE DETECTED 03/07/2012 1634   LABBARB NONE DETECTED 03/07/2012 1634    Urinalysis:  Basename 03/07/12 1634  COLORURINE YELLOW  LABSPEC 1.021  PHURINE 7.0  GLUCOSEU 250*  HGBUR NEGATIVE  BILIRUBINUR NEGATIVE  KETONESUR 15*  PROTEINUR  NEGATIVE  UROBILINOGEN 0.2  NITRITE NEGATIVE  LEUKOCYTESUR NEGATIVE    Imaging results:   Ct Head Wo Contrast (03/07/2012) - No evidence for acute  abnormality.  Original Report Authenticated By: Patterson Hammersmith, M.D.   Dg Chest Port 1 View (03/07/2012) - Mild chronic interstitial lung disease.  No acute abnormality.  Original Report Authenticated By: Darrol Angel, M.D.   Ct Angio Chest Aortic Dissect W &/or W/o (03/07/2012) -  No aortic dissection, pulmonary embolism or other acute abnormality.  Original Report Authenticated By: Darrol Angel, M.D.    Other results: EKG (03/07/2012) -  Pending.   Assessment & Plan: Patient is a 51 y.o., male with a PMHx  of diet controlled DMII, HTN, nonischemic cardiomyopathy with last LV EF 55% (previously as low as 35%) and grade 1 diastolic dysfunction, history of WPW s/p AICD placement), erosive esophagitis, who is admitted to [facility] on 03/07/2012 with 2-3 day history of chest pain, which at this time is of unclear etiology. CTA chest in ER was negative for evidence of PE, pneumothorax, significant pericardiac effusion (that could be observed at least), pneumonia. Interventions at this time will be focused on better understanding etiology of Mr. Kisean Rollo Nuss's chest pain. As well, we will involve cardiology service to provide AICD interrogation and additional workup.   1) Chest pain - unclear etiology, with multiple differential diagnoses being considered. Patient's history of severe pressure-type chest pain with radiation to BL arms in the setting of his cardiac risk factors including DMII, HTN, obesity, age, and remote tobacco abuse is certainly concerning for cardiac source. However, there are several atypical features that are not consistent, specifically, pain that is present both at rest and with exertion, without improvement with nitroglycerin - therefore the cause of his pain is not completely clear. Last cardiac catheterization in 10/2007 showed normal coronaries, and the patient has been maintained on his aspirin, beta blocker - although not on statin therapy. Other differential diagnoses include pain secondary to pulmonary hypertension in setting of uncontrolled OSA (does not tolerate CPAP) - although no clear evidence of resultant right heart failure on exam). Unclear if possible defibrillator malfunctioning is allowing for possible arrhythmias to contribute towards presenting symptoms. As well, he has history of erosive esophagitis (although would not explain his constellation of arm weakness, etc), other life-threatening differentials including aortic dissection, aneurysm, PE have been ruled out by CTA Chest.    Plan: - Admit to telemetry unit - Cycle cardiac enzymes x 3 - EKG now and in AM - Aspirin, beta blocker, morphine PRN check FLP --> add statin if appropriate - Start PPI for esophagitis. - Resume CPAP. - Consider 2D-echocardiogram to evaluate for PAH. - Check digoxin level - Will consult cardiology to evaluate AICD, consider interrogation, further cardiac workup as indicated as cardiac enzymes return.  2) Acute on chronic kidney injury - Unknown prior baseline. On admission, patient noted to have mild AKI with admission SCr of 1.4. FeNa is < 1 and urine ketones suggesting prerenal etiology, possibly in the setting of volume depletion (? Insensible losses from "convulsions" today versus possible osmotic diuresis in setting of uncontrolled DMII (states he is not compliant with home regimen). Lastly, the patient states he has baseline renal insufficiency, although cannot elaborate.  - Check orthostatic vital signs. - Check urinalysis, urine Na, urine creatinine. - Will provide very gentle fluids, NS @ 75 cc/hr x 8 hours, reevaluate in AM.  - Recheck BMET in AM - Check dig level,  if too elevated, will need to hold or reduce dosage.  3) UE weakness/ slurred speech - unclear etiology, has resolved by time of evaluation. Again, unclear the association of the chest pain, possible defibrillator malfunctioning? Given history of DMII, smoking, HTN, HLD he is also at risk for stroke or TIA. Cannot get an MRI secondary to AICD - as well, his symptoms are resolved at the time of evaluation. - Will check CT head.  - Check urinalysis.  4) Nonischemic cardiomyopathy - last echo in 03/2011 showing LV EF 50-55% with hypokinesis of the inferior myocardium and grade 1 diastolic dysfunction. Prior Echo in 02/2008 showing systolic function that is moderately depressed. Patient is s/p AICD placement in 11/2007 for cardiac arrhythmias and EF < 30%. Specifically, [employer] CURRENT at [place] VR RF single chamber  cardioverter defibrillator with  defibrillator threshold study less than or equal to 25 joules. Has not had interrogation in 8-12 months per patient. - Hold Lisinopril at present in setting of AKI, however, will resume at time of resolution of acute injury. - Continue beta blocker. - Daily weight, strict I/O  5) Erosive esophagitis - last EGD 08/2011 showing unhealed erosive esophagitis, patient had an esophageal dilatation at that time as well, with improved dysphagia to solids and liquids.  - Change to IV PPE. - Continue sucralfate.  6) HTN - well controlled, stable at present. As above, will hold lisinopril in setting of AKI. - Continue metoprolol at home dose.    7) Gout - on chronic allopurinol, no documentation of crystal testing. No indication of acute attack. - Check uric acid level. - Continue allopurinol, escalate dosage if appropriate for goal < 6.  8) Chronic lumbar pain secondary to DDD - follows at pain management clinic. - Continue home regimen.  9) DMII - unknown baseline control.  - Check A1c - SSI-S   Signed: Maryjean Ka, Internal Medicine Resident Pager: [phone] (7AM-5PM) 03/07/2012, 7:45 PM      Medical Student Hospital Admission Note Date: 03/07/2012  Patient name: CHANEY MACLAREN Medical record number: [id number] Date of birth: 15-Jun-1961 Age: 51 y.o. Gender: male PCP: Cassell Smiles., MD  Medical Service: Internal Medicine [facility]  Attending physician:   Rogelia Boga     Chief Complaint: Chest Pain  History of Present Illness:  Mr. Cartwright is a 51 year old male with a past medical history of non-ischemic cardiomyopathy, non-insulin dependant type II DM, and hypertension who presented to the [facility] ED with a complaint of a three day history of chest pain.  He notes that this pain began three days ago and he described it as a sense of pressure over his left chest that he rated as a 4/10 for pain.  The pain began when he was at  rest, and he had not done any recent strenuous activity.  His pain waxed and waned for three days, but never completely resolved.  He occasionally took 81 mg of aspirin if the pain was severe, and he noted that this helped a little.  Nothing seemed to make the pain worse.  He also noted that his left arm felt weak and numb when this pain began, and this has not changed since three days ago.  Of note, this morning, he told his wife that his vision seemed blurry which was a new problem for him.  One hour later, his wife noticed that his speech seemed slurred.  She gave him 81 mg of aspirin and tried to have  him eat something, though he did not since he had no appetite.  Within 30 minutes of the aspirin, his wife noticed that he began having convulsions all over his body.  At this point, she called 911 and he was transferred to the [facility] ED.  Of note, the patient does not remember anything that happened this morning, other than he was in extreme pain.  In the ED, the patient was placed on a nitroglycerin drip, receiving 30 mg in total.  He was also given 4 mg of ondansetron for nausea, 4 mg of morphine for pain, 1 mg of lorazepam, and dilaudid 1 mg x 2.  He was also given 1 L of normal saline.  Meds: Medications Prior to Admission  Medication Sig Dispense Refill  . allopurinol (ZYLOPRIM) 300 MG tablet Take 300 mg by mouth daily.       Marland Kitchen aspirin 81 MG chewable tablet Chew 81 mg by mouth daily.      . carvedilol (COREG) 25 MG tablet Take 25 mg by mouth 2 (two) times daily with a meal.      . digoxin (LANOXIN) 0.125 MG tablet Take 125 mcg by mouth daily.       Marland Kitchen lisinopril (PRINIVIL,ZESTRIL) 20 MG tablet Take 20 mg by mouth daily.      Marland Kitchen oxyCODONE-acetaminophen (PERCOCET) 10-325 MG per tablet Take 1 tablet by mouth every 6 (six) hours as needed. For pain      . pantoprazole (PROTONIX) 40 MG tablet Take 40 mg by mouth 2 (two) times daily.       . sucralfate (CARAFATE) 1 G tablet Take 1 g by mouth 4 (four) times  daily.        Allergies: Allergies as of 03/07/2012  . (No Known Allergies)   Past Medical History  Diagnosis Date  . Diabetes mellitus DX: 2010  . Gout   . Myocardial infarct, old   . Hypertension   . CKD (chronic kidney disease)   . Erosive esophagitis     per EGD (08/2011), Dr. Karilyn Cota - Erosive reflux esophagitis improved but not completely healed since previous EGD 3 years ago. Bx showing  ulcerated gatroesophageal junction mucosa. negative for H. pylori  . Nonischemic dilated cardiomyopathy   . DDD (degenerative disc disease), lumbar   . Hearing deficit     wear bilateral hearing aides  . AICD (automatic cardioverter/defibrillator) present 11/2007    Dr. Graciela Husbands   Past Surgical History  Procedure Date  . Back surgery     1995  . Appendectomy   . Cardiac defibrillator placement   . Cholecystectomy   . Ablation for svt     at [facility]  . Elbow surgery 2011, nov   Family History  Problem Relation Age of Onset  . Heart attack Mother 16  . Heart attack Father 44  . Heart attack Brother 55  . Hypertension Mother   . Hypertension Father   . Hypertension Brother   . Stroke Maternal Grandmother 80  . Diabetes Mother   . Diabetes Father   . Diabetes Brother    History   Social History  . Marital Status: Married    Spouse Name: N/A    Number of Children: N/A  . Years of Education: N/A   Occupational History  . Worked for [employer] off in 11/11   Social History Main Topics  . Smoking status: Former Smoker -- 60 years    Types: Cigarettes  Quit date: 07/30/2010  . Smokeless tobacco: Never Used  . Alcohol Use: 0.6 oz/week    1 Cans of beer per week  . Drug Use: No  . Sexually Active: Not on file   Other Topics Concern  . Not on file   Social History Narrative  . No narrative on file    Review of Systems: HEENT: blurry vision CV: chest pain/pressure, increased heart rate Respiratory: SOB on exertion GI: negative Extremities: numbness  in left arm, weakness in both arms, cold sensation in both feet GU: occasional difficulty initiating urination  Physical Exam: Blood pressure 124/79, pulse 93, temperature 97.3 F (36.3 C), temperature source Oral, resp. rate 18, height 6\' 2"  (1.88 m), weight 231 lb 4.2 oz (104.9 kg), SpO2 100.00%. GEN: Appears to be in mild amount of pain, well nourished male lying in hospital bed. HEENT: PERRL, EOMI, MMM Neck:  Trachea is midline, no thyromegaly CV:  Normal S1, S2.  No murmurs, rubs, or gallops appreciated. 2+ radial pulses, 1+ pedal pulses bilaterally RESP:  Lungs CTAB, no wheezing, rales, or rhonchi GI:  Normoactive bowel sounds, mild tenderness to palpation and guarding in periumbilical region. NEURO: 4-/5 strength in bilateral upper and lower extremities.  Decreased sensation to light touch on all of face and bilateral upper extremities.  Decreased sensation to sharp touch on both feet and right arm. PSYCH: alert and oriented, slow to respond to questions, but answers appropriately.  Lab results: CBC    Component Value Date/Time   WBC 5.8 03/07/2012 1332   RBC 5.38 03/07/2012 1332   HGB 17.2* 03/07/2012 1332   HCT 45.7 03/07/2012 1332   PLT 127* 03/07/2012 1332   MCV 84.9 03/07/2012 1332   MCH 32.0 03/07/2012 1332   MCHC 37.6* 03/07/2012 1332   RDW 13.8 03/07/2012 1332   LYMPHSABS 1.2 03/07/2012 1332   MONOABS 0.5 03/07/2012 1332   EOSABS 0.1 03/07/2012 1332   BASOSABS 0.1 03/07/2012 1332    BMET    Component Value Date/Time   NA 134* 03/07/2012 1332   K 3.8 03/07/2012 1332   CL 97 03/07/2012 1332   CO2 20 03/07/2012 1332   GLUCOSE 208* 03/07/2012 1332   BUN 11 03/07/2012 1332   CREATININE 1.39* 03/07/2012 1332   CALCIUM 9.8 03/07/2012 1332   GFRNONAA 57* 03/07/2012 1332   GFRAA 66* 03/07/2012 1332   CK-MB: 6.6 CK total: 126 Trop: <0.034 BMP: 99.3  Imaging results:  Ct Head Wo Contrast  03/07/2012  *RADIOLOGY REPORT*  Clinical Data: Upper extremity weakness.  Memory loss.   Confusion.  CT HEAD WITHOUT CONTRAST  Technique:  Contiguous axial images were obtained from the base of the skull through the vertex without contrast.  Comparison: None  Findings: The study is performed following contrast enhanced exam of the chest.  There is residual contrast in the blood vessels.  There is no intra or extra-axial fluid collection or mass lesion. The basilar cisterns and ventricles have a normal appearance. There is no CT evidence for acute infarction or hemorrhage.  Bone windows show no calvarial fracture.  The visualized paranasal sinuses are well-aerated.  IMPRESSION: No evidence for acute  abnormality.  Original Report Authenticated By: Patterson Hammersmith, M.D.   Dg Chest Port 1 View  03/07/2012  *RADIOLOGY REPORT*  Clinical Data: Severe chest pain.  Shortness of breath.  Ex-smoker. Diabetes.  Hypertension.  PORTABLE CHEST - 1 VIEW  Comparison: 03/30/2011.  Findings: The heart remains normal in size.  The patient is  currently rotated to the left.  The patient's chin is obscuring the medial right lung apex.  The visualized lungs are clear.  The interstitial markings are mildly prominent without Kerley lines. No pleural fluid.  Thoracic spine degenerative changes.  IMPRESSION: Mild chronic interstitial lung disease.  No acute abnormality.  Original Report Authenticated By: Darrol Angel, M.D.   Ct Angio Chest Aortic Dissect W &/or W/o  03/07/2012  *RADIOLOGY REPORT*  Clinical Data:  Left chest pain and shortness of breath.  Clinical concern for aortic dissection.  CT ANGIOGRAPHY CHEST WITHOUT AND WITH CONTRAST  Technique:  Multidetector CT imaging of the chest was performed using the standard protocol before and during bolus administration of intravenous contrast.  Multiplanar CT image reconstructions including MIPs were obtained to evaluate the vascular anatomy.  Contrast: 80mL OMNIPAQUE IOHEXOL 350 MG/ML SOLN  Comparison:  Portable chest obtained earlier today.  Findings:  Minimal  atheromatous change in the thoracic aorta without aneurysm or dissection.  A left subclavian pacemaker lead is demonstrated with its tip at the right ventricular apex.  The lungs are clear.  No lung masses or enlarged lymph nodes.  The pulmonary arteries are normally opacified with no pulmonary arterial filling defects seen.  Thoracic spine degenerative changes.  The upper abdomen is unremarkable.  Review of the MIP images confirms the above findings.  IMPRESSION: No aortic dissection, pulmonary embolism or other acute abnormality.  Original Report Authenticated By: Darrol Angel, M.D.    Other results: EKG: pending.  Assessment & Plan by Problem: Mr. Pieczynski is a 51 year old male with a past medical history of non-ischemic cardiomyopathy, non-insulin dependant type II DM, and hypertension who presented to the [facility] ED with a complaint of a three day history of chest pain. He has also developed, blurry vision, slurred speech, and numbness in his face and extremities as well as weakness in his bilateral upper extremities.  Chest Pain: Given the sudden onset of his pain at rest, his pain is worrisome for unstable angina or possibly an NSTEMI.  However,  This may be less likely given his normal troponin so far.  This could also be due to dysfunction from his internal defibrillator.  He is past due for an appointment with Dr. Ladona Ridgel to evaluate this.  Aortic dissection is less likely given his normal chest CT.  Given his history of erosive esophagitis, this could also be due to aggravation of this problem.   - Will continue to cycle cardiac biomarkers - Obtain EKG once on the floor - Obtain daily weights, record I/O's. - place patient on overnight telemetry - Obtain thyroid panel - Continue home ASA 81 mg, Carvedilol 25 mg bid, lisinopril 20 mg - Continue pantoprazole to prevent further damage from erosive esophagitis.  Decreased sensation and weakness: These symptoms along with his slurred  speech, blurry vision, and convulsions are worrisome for a possible stroke.  Give his history of DM, HTN, and prior cardiac disease, a stroke is very possible.  Much less likely options include developing MS or myasthenia gravis. - Obtain CT head to evaluate for hemorrhagic or ischemic stroke.   - Continue neuro checks  Elevated Creatinine: The patient's creatinine is slightly elevated from baseline (~1.1).  This could be due to dehydration, underperfusion from heart disease, or could be sequelae of hypertension.  - Continue normal saline - repeat BMET in the AM - Repeat urine lytes if not correcting   This is a Psychologist, occupational Note.  The  care of the patient was discussed with Dr. Saralyn Pilar and the assessment and plan was formulated with their assistance.  Please see their note for official documentation of the patient encounter.   Signed: Baltazar Apo, MS-IV

## 2012-03-08 DIAGNOSIS — R5381 Other malaise: Secondary | ICD-10-CM

## 2012-03-08 LAB — LIPID PANEL
LDL Cholesterol: 19 mg/dL (ref 0–99)
Total CHOL/HDL Ratio: 5.8 RATIO
Triglycerides: 359 mg/dL — ABNORMAL HIGH (ref <150)

## 2012-03-08 LAB — CARDIAC PANEL(CRET KIN+CKTOT+MB+TROPI)
Relative Index: 4.8 — ABNORMAL HIGH (ref 0.0–2.5)
Relative Index: INVALID (ref 0.0–2.5)
Total CK: 102 U/L (ref 7–232)
Total CK: 87 U/L (ref 7–232)
Troponin I: <0.3 ng/mL (ref <0.30)
Troponin I: <0.3 ng/mL (ref <0.30)

## 2012-03-08 LAB — GLUCOSE, CAPILLARY
Glucose-Capillary: 148 mg/dL — ABNORMAL HIGH (ref 70–99)
Glucose-Capillary: 160 mg/dL — ABNORMAL HIGH (ref 70–99)

## 2012-03-08 LAB — TSH: TSH: 0.579 u[IU]/mL (ref 0.350–4.500)

## 2012-03-08 MED ORDER — PANTOPRAZOLE SODIUM 40 MG PO TBEC
40.0000 mg | DELAYED_RELEASE_TABLET | Freq: Every day | ORAL | Status: DC
  Administered 2012-03-08 – 2012-03-10 (×3): 40 mg via ORAL
  Filled 2012-03-08 (×3): qty 1

## 2012-03-08 NOTE — Progress Notes (Signed)
Resident Co-sign Daily Note: I have seen the patient and reviewed the daily progress note by Rosalyn Charters MS IV and discussed the care of the patient with them.  See below for documentation of my findings, assessment, and plans.  Subjective: He reports feeling much better- improved chest discomfort/heaviness. Denies any nausea or vomiting.  Objective: Vital signs in last 24 hours: Filed Vitals:   03/07/12 1926 03/08/12 0648 03/08/12 0650 03/08/12 0651  BP: 124/79 123/83 111/77 120/80  Pulse: 93 99 96 98  Temp: 97.3 F (36.3 C) 98.2 F (36.8 C)    TempSrc: Oral Oral    Resp: 18 18    Height: 6\' 2"  (1.88 m)     Weight: 231 lb 4.2 oz (104.9 kg) 230 lb 9.6 oz (104.6 kg)    SpO2: 100% 98%     Physical Exam: BP 120/80  Pulse 98  Temp 98.2 F (36.8 C) (Oral)  Resp 18  Ht 6\' 2"  (1.88 m)  Wt 230 lb 9.6 oz (104.6 kg)  BMI 29.61 kg/m2  SpO2 98%  General Appearance:    Alert, cooperative, no distress, appears stated age  Head:    Normocephalic, without obvious abnormality, atraumatic  Eyes:    PERRL, conjunctiva/corneas clear, EOM's intact, fundi    benign, both eyes       Ears:    Normal TM's and external ear canals, both ears  Nose:   Nares normal, septum midline, mucosa normal, no drainage    or sinus tenderness  Throat:   Lips, mucosa, and tongue normal; teeth and gums normal  Neck:   Supple, symmetrical, trachea midline, no adenopathy;       thyroid:  No enlargement/tenderness/nodules; no carotid   bruit or JVD  Back:     Symmetric, no curvature, ROM normal, no CVA tenderness  Lungs:     Clear to auscultation bilaterally, respirations unlabored  Chest wall:    No tenderness or deformity  Heart:    Regular rate and rhythm, S1 and S2 normal, no murmur, rub   or gallop  Abdomen:     Soft, non-tender, bowel sounds active all four quadrants,    no masses, no organomegaly  Genitalia:    Normal male without lesion, discharge or tenderness  Rectal:    Normal tone, normal  prostate, no masses or tenderness;   guaiac negative stool  Extremities:   Extremities normal, atraumatic, no cyanosis or edema  Pulses:   2+ and symmetric all extremities  Skin:   Skin color, texture, turgor normal, no rashes or lesions  Lymph nodes:   Cervical, supraclavicular, and axillary nodes normal  Neurologic:   Blurred vision( Left >right), subjectively decreased sensations on the left side as compared to the right, motor strength 5/5 in all four extremities.   Lab Results: Reviewed and documented in Electronic Record Micro Results: Reviewed and documented in Electronic Record Studies/Results: Reviewed and documented in Electronic Record Medications: I have reviewed the patient's current medications. Scheduled Meds:   . allopurinol  300 mg Oral Daily  . aspirin  81 mg Oral Daily  . carvedilol  25 mg Oral BID WC  . heparin  5,000 Units Subcutaneous Q8H  .  HYDROmorphone (DILAUDID) injection  1 mg Intravenous Once  .  HYDROmorphone (DILAUDID) injection  1 mg Intravenous Once  . insulin aspart  0-9 Units Subcutaneous TID WC  . LORazepam  1 mg Intravenous Once  .  morphine injection  4 mg Intravenous Once  . ondansetron  4 mg Intravenous Once  . pantoprazole (PROTONIX) IV  40 mg Intravenous Q12H  . sodium chloride  1,000 mL Intravenous Once  . sodium chloride  3 mL Intravenous Q12H  . sucralfate  1 g Oral QID   Continuous Infusions:   . sodium chloride 75 mL/hr at 03/07/12 2026  . DISCONTD: nitroGLYCERIN Stopped (03/07/12 1413)   PRN Meds:.iohexol, morphine injection, oxyCODONE, oxyCODONE-acetaminophen, DISCONTD: oxyCODONE-acetaminophen Assessment/Plan: Patient is a 51 y.o., male with a PMHx of diet controlled DMII, HTN, nonischemic cardiomyopathy with last LV EF 55% (previously as low as 35%) and grade 1 diastolic dysfunction, history of WPW s/p AICD placement), erosive esophagitis, who is admitted to [facility] on 03/07/2012 with 2-3 day history of chest pain, which at this  time is of unclear etiology. CTA chest in ER was negative for evidence of PE, pneumothorax, significant pericardiac effusion (that could be observed at least), pneumonia.  1) Chest pain -  likely musculoskeletal in origin( chest wall tender to palpation on the left side). Other possibility includes GI origin given his history of erosive reflux esophagitis. Cardiac etiology appears to be less likely.  His cardiac enzymes x3 were negative and EKG did not demonstrate any acute ischemic changes. We appreciate cardiology inputs! Patient to get his ICD interrogated today. Plan:  - Aspirin, beta blocker, morphine PRN check FLP --> add statin if appropriate  - Continue PPI and sucralfate for esophagitis.   2) Acute on chronic kidney injury - Baseline around 1.3.  Close to his baseline. - Continue to monitor   -BMET in AM  3)  Convulsions/ decreased sensation son the left - Wife reports an episode of convulsions with speech changes yesterday unclear etiology, has resolved by time of evaluation. Again, unclear the association of the chest pain, possible defibrillator malfunctioning? Given history of DMII, smoking, HTN, HLD he is also at risk for stroke or TIA. Cannot get an MRI secondary to AICD - as well, his symptoms are resolved at the time of evaluation.  - Will check CT head if his symptoms persists or get worse tomorrow.   4) Nonischemic cardiomyopathy - last echo in 03/2011 showing LV EF 50-55% with hypokinesis of the inferior myocardium and grade 1 diastolic dysfunction. Prior Echo in 02/2008 showing systolic function that is moderately depressed. Patient is s/p AICD placement in 11/2007 for cardiac arrhythmias and EF < 30%. Specifically, [employer] Jude CURRENT at [place] VR RF single chamber cardioverter defibrillator with  defibrillator threshold study less than or equal to 25 joules. Has not had interrogation in 8-12 months per patient.  - Hold Lisinopril at present in setting of AKI, however, will resume  at time of resolution of acute injury.  - Continue beta blocker.  - Daily weight, strict I/O   5) Erosive esophagitis - last EGD 08/2011 showing unhealed erosive esophagitis, patient had an esophageal dilatation at that time as well, with improved dysphagia to solids and liquids.  - Continue protonix and sucralfate.   6) HTN - well controlled, stable at present. As above, will hold lisinopril in setting of AKI.  - Continue metoprolol at home dose.   7) Gout - on chronic allopurinol, no documentation of crystal testing( uric acid -5.5). No indication of acute attack.  - Continue allopurinol, escalate dosage if appropriate for goal < 6.   8) Chronic lumbar pain secondary to DDD - follows at pain management clinic.  - Continue home regimen.   9) DMII - AIC - 6.5 . Diet controlled. Wife reports  that he has not been taking any medicines for few months Continue SSI       LOS: 1 day   Syreeta Figler 03/08/2012, 10:39 AMMedical Student Daily Progress Note  Subjective: Mr. Dozal states that he is feeling much better this morning.  He rates his sensation of chest pressure as a 2/10 for pain.  He did not sleep well last night, due to lab draw interruptions.  He feels that his strength has returned and he is less confused than yesterday.  He has been eating well and reports no other current issues.   Objective: Vital signs in last 24 hours: Filed Vitals:   03/07/12 1926 03/08/12 0648 03/08/12 0650 03/08/12 0651  BP: 124/79 123/83 111/77 120/80  Pulse: 93 99 96 98  Temp: 97.3 F (36.3 C) 98.2 F (36.8 C)    TempSrc: Oral Oral    Resp: 18 18    Height: 6\' 2"  (1.88 m)     Weight: 104.9 kg (231 lb 4.2 oz) 104.6 kg (230 lb 9.6 oz)    SpO2: 100% 98%     Weight change: none  Intake/Output Summary (Last 24 hours) at 03/08/12 0919 Last data filed at 03/08/12 0318  Gross per 24 hour  Intake    240 ml  Output    900 ml  Net   -660 ml   Physical Exam: General appearance: alert,  cooperative, appears stated age, fatigued and no distress Eyes: positive findings: visual field defect in left field in the left eye Lungs: clear to auscultation bilaterally Chest wall: left sided chest wall tenderness Heart: regular rate and rhythm, S1, S2 normal, no murmur, click, rub or gallop Abdomen: soft, non-tender; bowel sounds normal; no masses,  no organomegaly Extremities: extremities normal, atraumatic, no cyanosis or edema Pulses: 1+ bilateral Pedal Pulse Neurologic: Cranial nerves: V: facial light touch sensation abnormal on the left Sensory: reduced tactile sense on left face, upper and lower extremities Motor: grossly normal Lab Results: BMET    Component Value Date/Time   NA 134* 03/07/2012 1332   K 3.8 03/07/2012 1332   CL 97 03/07/2012 1332   CO2 20 03/07/2012 1332   GLUCOSE 208* 03/07/2012 1332   BUN 11 03/07/2012 1332   CREATININE 1.39* 03/07/2012 1332   CALCIUM 9.8 03/07/2012 1332   GFRNONAA 57* 03/07/2012 1332   GFRAA 66* 03/07/2012 1332    CBC    Component Value Date/Time   WBC 5.8 03/07/2012 1332   RBC 5.38 03/07/2012 1332   HGB 17.2* 03/07/2012 1332   HCT 45.7 03/07/2012 1332   PLT 127* 03/07/2012 1332   MCV 84.9 03/07/2012 1332   MCH 32.0 03/07/2012 1332   MCHC 37.6* 03/07/2012 1332   RDW 13.8 03/07/2012 1332   LYMPHSABS 1.2 03/07/2012 1332   MONOABS 0.5 03/07/2012 1332   EOSABS 0.1 03/07/2012 1332   BASOSABS 0.1 03/07/2012 1332    Cardiac Panel (last 3 results)  Basename 03/08/12 0635 03/08/12 0055 03/07/12 2035  CKTOTAL 87 102 116  CKMB 4.8* 4.9* 5.5*  TROPONINI <0.30 <0.30 <0.30  RELINDX RELATIVE INDEX IS INVALID 4.8* 4.7*   Lipid Panel     Component Value Date/Time   CHOL 110 03/08/2012 0635   TRIG 359* 03/08/2012 0635   HDL 19* 03/08/2012 0635   CHOLHDL 5.8 03/08/2012 0635   VLDL 72* 03/08/2012 0635   LDLCALC 19 03/08/2012 0635    Digoxin Level: 0.4 CBG: 148 at 06:35 HgbA1C: 6.5  Micro Results: No results found for this or  any previous visit  (from the past 240 hour(s)). Studies/Results: Ct Head Wo Contrast  03/07/2012  *RADIOLOGY REPORT*  Clinical Data: Upper extremity weakness.  Memory loss.  Confusion.  CT HEAD WITHOUT CONTRAST  Technique:  Contiguous axial images were obtained from the base of the skull through the vertex without contrast.  Comparison: None  Findings: The study is performed following contrast enhanced exam of the chest.  There is residual contrast in the blood vessels.  There is no intra or extra-axial fluid collection or mass lesion. The basilar cisterns and ventricles have a normal appearance. There is no CT evidence for acute infarction or hemorrhage.  Bone windows show no calvarial fracture.  The visualized paranasal sinuses are well-aerated.  IMPRESSION: No evidence for acute  abnormality.  Original Report Authenticated By: Patterson Hammersmith, M.D.   Dg Chest Port 1 View  03/07/2012  *RADIOLOGY REPORT*  Clinical Data: Severe chest pain.  Shortness of breath.  Ex-smoker. Diabetes.  Hypertension.  PORTABLE CHEST - 1 VIEW  Comparison: 03/30/2011.  Findings: The heart remains normal in size.  The patient is currently rotated to the left.  The patient's chin is obscuring the medial right lung apex.  The visualized lungs are clear.  The interstitial markings are mildly prominent without Kerley lines. No pleural fluid.  Thoracic spine degenerative changes.  IMPRESSION: Mild chronic interstitial lung disease.  No acute abnormality.  Original Report Authenticated By: Darrol Angel, M.D.   Ct Angio Chest Aortic Dissect W &/or W/o  03/07/2012  *RADIOLOGY REPORT*  Clinical Data:  Left chest pain and shortness of breath.  Clinical concern for aortic dissection.  CT ANGIOGRAPHY CHEST WITHOUT AND WITH CONTRAST  Technique:  Multidetector CT imaging of the chest was performed using the standard protocol before and during bolus administration of intravenous contrast.  Multiplanar CT image reconstructions including MIPs were obtained to  evaluate the vascular anatomy.  Contrast: 80mL OMNIPAQUE IOHEXOL 350 MG/ML SOLN  Comparison:  Portable chest obtained earlier today.  Findings:  Minimal atheromatous change in the thoracic aorta without aneurysm or dissection.  A left subclavian pacemaker lead is demonstrated with its tip at the right ventricular apex.  The lungs are clear.  No lung masses or enlarged lymph nodes.  The pulmonary arteries are normally opacified with no pulmonary arterial filling defects seen.  Thoracic spine degenerative changes.  The upper abdomen is unremarkable.  Review of the MIP images confirms the above findings.  IMPRESSION: No aortic dissection, pulmonary embolism or other acute abnormality.  Original Report Authenticated By: Darrol Angel, M.D.   EKG: Not concerning for ischemia or arrhythmia.  Medications: I have reviewed the patient's current medications. Scheduled Meds:   . allopurinol  300 mg Oral Daily  . aspirin  81 mg Oral Daily  . carvedilol  25 mg Oral BID WC  . heparin  5,000 Units Subcutaneous Q8H  .  HYDROmorphone (DILAUDID) injection  1 mg Intravenous Once  .  HYDROmorphone (DILAUDID) injection  1 mg Intravenous Once  . insulin aspart  0-9 Units Subcutaneous TID WC  . LORazepam  1 mg Intravenous Once  .  morphine injection  4 mg Intravenous Once  . ondansetron  4 mg Intravenous Once  . pantoprazole (PROTONIX) IV  40 mg Intravenous Q12H  . sodium chloride  1,000 mL Intravenous Once  . sodium chloride  3 mL Intravenous Q12H  . sucralfate  1 g Oral QID   Continuous Infusions:   . sodium chloride 75 mL/hr  at 03/07/12 2026  . DISCONTD: nitroGLYCERIN Stopped (03/07/12 1413)   PRN Meds:.iohexol, morphine injection, oxyCODONE, oxyCODONE-acetaminophen, DISCONTD: oxyCODONE-acetaminophen Assessment/Plan: Patient is a 51 y.o., male with a PMHx of diet controlled DMII, HTN, nonischemic cardiomyopathy with last LV EF 55% (previously as low as 35%) and grade 1 diastolic dysfunction, history of WPW  s/p AICD placement), erosive esophagitis, who is admitted to [facility] on 03/07/2012 with 2-3 day history of chest pain, which at this time is still of unclear etiology. CTA chest in ER was negative for evidence of PE, pneumothorax, significant pericardiac effusion (that could be observed at least), pneumonia. Interventions at this time will be focused on better understanding etiology of [name]'s chest pain. As well, the cardiology service is involved to provide AICD interrogation and additional workup.   1) Chest pain -  The patient's history of severe pressure-type chest pain with radiation to BL arms in the setting of his cardiac risk factors including DMII, HTN, obesity, age, and remote tobacco abuse is certainly concerning for cardiac source.  However, at this time, his chest pain is less likely cardiac in nature given negative cardiac enzymes x3, normal EKG, reproducible pain with palpation, normal cardiac cath in 2009, and lack of response to nitroglycerin.  His TSH level is wnl at 0.6.  Cardiology consult feels that this is also likely not cardiac in nature, but would still like to investigate his AICD.  Other differential diagnoses include pain secondary to pulmonary hypertension in setting of uncontrolled OSA (does not tolerate CPAP) - although no clear evidence of resultant right heart failure on exam). It is still unclear if possible defibrillator malfunctioning is allowing for possible arrhythmias to contribute towards presenting symptoms. As well, he has history of erosive esophagitis (although would not explain his constellation of arm weakness, etc).  Of note, he has been complaining of increased trouble swallowing food lately which could indicate a return of his problem with esophogeal narrowing.  Other life-threatening differentials including aortic dissection, aneurysm, PE have been ruled out by CTA Chest.  Plan:  - Continue on telemetry unit  - Continue aspirin, beta blocker, morphine  PRN - Continue PPI for esophagitis.  - Will await cardiology's evaluation of his AICD as a possible cause of his pain and reported convulsions.   2) Acute on chronic kidney injury - On admission, patient noted to have mild AKI with admission SCr of 1.4.  Upon further review, this is within his baseline range based on previous visits.  FeNa is < 1 and urine ketones suggesting prerenal etiology, possibly in the setting of volume depletion (? Insensible losses from "convulsions" versus possible osmotic diuresis in setting of uncontrolled DMII (states he is not compliant with home regimen). Lastly, the patient states he has baseline renal insufficiency, although he cannot elaborate.  He has shown no signs of orthostatic hypotension.   Plan: - Plan to discontinue fluids now that patient is eating and drinking. - Recheck BMET  3) UE weakness/ slurred speech - unclear etiology, had resolved by time of evaluation. Again, it is unclear if this is associated with the chest pain or possible defibrillator malfunctioning. Given his history of DMII, smoking, HTN, low HLD he is also at risk for stroke or TIA.  However, head CT from yesterday shows no acute processes.  Cannot get an MRI secondary to AICD - as well, his symptoms are resolved at the time of evaluation.  As of 7/31, his neurological exam has improved with return of  strength to 5/5 in his upper extremities.  However, his decreased sensation on his left side are still worrisome for brain ischemia.   Plan:  - Continue to perform neuro checks. - Consider repeat head CT or carotid dopplers if his condition acutely worsens.   4) Nonischemic cardiomyopathy - last echo in 03/2011 showing LV EF 50-55% with hypokinesis of the inferior myocardium and grade 1 diastolic dysfunction. Prior Echo in 02/2008 showing systolic function that is moderately depressed. Patient is s/p AICD placement in 11/2007 for cardiac arrhythmias and EF < 30%. Specifically, [employer] Jude CURRENT  at [place] VR RF single chamber cardioverter defibrillator with defibrillator threshold study less than or equal to 25 joules. Has not had interrogation in 8-12 months per patient.  Plan: - Re-start lisinopril given lack of acute increase in Cr from his baseline.  - Continue beta blocker.  - Daily weight, strict I/O   5) Erosive esophagitis - last EGD 08/2011 showing unhealed erosive esophagitis, patient had an esophageal dilatation at that time as well, with improved dysphagia to solids and liquids.   However, he states that his trouble swallowing his been increasing over the past few months, indicating a possible narrowing of his esophagus. Plan: - Continue IV PPE.  - Continue sucralfate.  - Consider follow up with GI to evaluate possible re-narrowing of esophagus and potential ulcer as a cause of recurrent chest pain (patient's insurance runs out today, will hopefully have disability within the next few months.)  6) HTN - well controlled, stable at present. As above, will hold lisinopril in setting of AKI.  Plan: - Continue carvedilol at home dose.   7) Gout - on chronic allopurinol, no documentation of crystal testing. No indication of acute attack.  Uric acid level is 5.5. Plan: - Continue allopurinol, escalate dosage if appropriate for goal < 6.   8) Chronic lumbar pain secondary to DDD - follows at pain management clinic.  Plan: - Continue home regimen.   9) DMII - unknown baseline control.  Patient has not had his diabetes medication (unknown) filled in over two months due to cost issues.  His A1C on admission was 6.5 indicating appropriate control. Plan:  - SSI-S   Active Problems:  * No active hospital problems. *    LOS: 1 day   This is a Psychologist, occupational Note.  The care of the patient was discussed with Dr. Dorthula Rue and the assessment and plan formulated with their assistance.  Please see their attached note for official documentation of the daily encounter.  Valarie Merino 03/08/2012, 9:19 AM

## 2012-03-08 NOTE — Progress Notes (Signed)
Patient ID: Mike Floyd, male   DOB: 21-Apr-1961, 51 y.o.   MRN: [id number] Subjective:  Chest pain has resolved. No sob.  Objective:  Vital Signs in the last 24 hours: Temp:  [97.3 F (36.3 C)-98.2 F (36.8 C)] 98.2 F (36.8 C) (07/31 0648) Pulse Rate:  [93-121] 98  (07/31 0651) Resp:  [11-20] 18  (07/31 0648) BP: (111-128)/(75-90) 120/80 mmHg (07/31 0651) SpO2:  [95 %-100 %] 98 % (07/31 0648) Weight:  [229 lb 9.6 oz (104.146 kg)-231 lb 4.2 oz (104.9 kg)] 230 lb 9.6 oz (104.6 kg) (07/31 0648)  Intake/Output from previous day: 07/30 0701 - 07/31 0700 In: 240 [P.O.:240] Out: 900 [Urine:900] Intake/Output from this shift: Total I/O In: 360 [P.O.:360] Out: 350 [Urine:350]  Physical Exam: Well appearing middle aged man, NAD HEENT: Unremarkable Neck:  No JVD, no thyromegally Lungs:  Clear with no wheezes HEART:  Regular rate rhythm, no murmurs, no rubs, no clicks Abd:  Flat, positive bowel sounds, no organomegally, no rebound, no guarding Ext:  2 plus pulses, no edema, no cyanosis, no clubbing Skin:  No rashes no nodules Neuro:  CN II through XII intact, motor grossly intact  Lab Results:  Basename 03/07/12 1332  WBC 5.8  HGB 17.2*  PLT 127*    Basename 03/07/12 1332  NA 134*  K 3.8  CL 97  CO2 20  GLUCOSE 208*  BUN 11  CREATININE 1.39*    Basename 03/08/12 0635 03/08/12 0055  TROPONINI <0.30 <0.30   Hepatic Function Panel  Basename 03/07/12 1332  PROT 6.9  ALBUMIN 4.5  AST 34  ALT 53  ALKPHOS 104  BILITOT 1.0  BILIDIR --  IBILI --    Basename 03/08/12 0635  CHOL 110   No results found for this basename: PROTIME in the last 72 hours  Imaging: Ct Head Wo Contrast  03/07/2012  *RADIOLOGY REPORT*  Clinical Data: Upper extremity weakness.  Memory loss.  Confusion.  CT HEAD WITHOUT CONTRAST  Technique:  Contiguous axial images were obtained from the base of the skull through the vertex without contrast.  Comparison: None  Findings: The study is  performed following contrast enhanced exam of the chest.  There is residual contrast in the blood vessels.  There is no intra or extra-axial fluid collection or mass lesion. The basilar cisterns and ventricles have a normal appearance. There is no CT evidence for acute infarction or hemorrhage.  Bone windows show no calvarial fracture.  The visualized paranasal sinuses are well-aerated.  IMPRESSION: No evidence for acute  abnormality.  Original Report Authenticated By: Patterson Hammersmith, M.D.   Dg Chest Port 1 View  03/07/2012  *RADIOLOGY REPORT*  Clinical Data: Severe chest pain.  Shortness of breath.  Ex-smoker. Diabetes.  Hypertension.  PORTABLE CHEST - 1 VIEW  Comparison: 03/30/2011.  Findings: The heart remains normal in size.  The patient is currently rotated to the left.  The patient's chin is obscuring the medial right lung apex.  The visualized lungs are clear.  The interstitial markings are mildly prominent without Kerley lines. No pleural fluid.  Thoracic spine degenerative changes.  IMPRESSION: Mild chronic interstitial lung disease.  No acute abnormality.  Original Report Authenticated By: Darrol Angel, M.D.   Ct Angio Chest Aortic Dissect W &/or W/o  03/07/2012  *RADIOLOGY REPORT*  Clinical Data:  Left chest pain and shortness of breath.  Clinical concern for aortic dissection.  CT ANGIOGRAPHY CHEST WITHOUT AND WITH CONTRAST  Technique:  Multidetector CT imaging  of the chest was performed using the standard protocol before and during bolus administration of intravenous contrast.  Multiplanar CT image reconstructions including MIPs were obtained to evaluate the vascular anatomy.  Contrast: 80mL OMNIPAQUE IOHEXOL 350 MG/ML SOLN  Comparison:  Portable chest obtained earlier today.  Findings:  Minimal atheromatous change in the thoracic aorta without aneurysm or dissection.  A left subclavian pacemaker lead is demonstrated with its tip at the right ventricular apex.  The lungs are clear.  No lung  masses or enlarged lymph nodes.  The pulmonary arteries are normally opacified with no pulmonary arterial filling defects seen.  Thoracic spine degenerative changes.  The upper abdomen is unremarkable.  Review of the MIP images confirms the above findings.  IMPRESSION: No aortic dissection, pulmonary embolism or other acute abnormality.  Original Report Authenticated By: Darrol Angel, M.D.    Cardiac Studies: Tele - NSR Assessment/Plan:  1. Chest pain 2. Chronic systolic heart failure Rec: See Dr. Ludwig Clarks note. He is scheduled to see me in August. No need to interogate ICD today. Ok for discharge from cardiology perspective.  LOS: 1 day    Buel Ream.D. 03/08/2012, 2:06 PM

## 2012-03-08 NOTE — Care Management Note (Unsigned)
    Page 1 of 2   03/10/2012     3:20:44 PM   CARE MANAGEMENT NOTE 03/10/2012  Patient:  Mike Floyd, Mike Floyd   Account Number:  [id number]  Date Initiated:  03/08/2012  Documentation initiated by:  SIMMONS,Marquet Faircloth  Subjective/Objective Assessment:   ADMITTED WITH CP; LIVES AT HOME WITH WIFE- [name]; WAS IPTA; USES [facility] IN [place] FOR RX AND SOMETIMES [name].     Action/Plan:   DISCHARGE PLANNING DISCUSSED AT BEDSIDE.   Anticipated DC Date:  03/09/2012   Anticipated DC Plan:  HOME/SELF CARE      DC Planning Services  CM consult      [facility]  HOME HEALTH   Choice offered to / List presented to:  C-1 Patient        HH arranged  HH-2 PT      HH agency  [facility]   Status of service:  In process, will continue to follow Medicare Important Message given?   (If response is "NO", the following Medicare IM given date fields will be blank) Date Medicare IM given:   Date Additional Medicare IM given:    Discharge Disposition:    Per UR Regulation:  Reviewed for med. necessity/level of care/duration of stay  If discussed at Long Length of Stay Meetings, dates discussed:    Comments:  03/10/12  1520  Erie Radu SIMMONS RN, BSN [phone] REFERRAL PLACED TO [facility] FOR HHPT PER CHOICE; SOC DATE: WITHIN 24-48HRS POST D/C; PT READY FOR DISCHARGE.  03/08/12  1124  Arika Mainer SIMMONS RN, BSN [phone] NCM WILL FOLLOW.

## 2012-03-08 NOTE — H&P (Addendum)
Internal Medicine Teaching Service Attending Note Date: 03/08/2012  Patient name: Mike Floyd  Medical record number: [id number]  Date of birth: Nov 10, 1960   I have seen and evaluated Kara Dies and discussed their care with the Residency Team. Please see Dr Shaune Spittle H&P for full details. Mr Pirro and his wife provided the history. The wife has noticed that he is favoring his left arm for about 3 days and would complain that it felt heavy. She also noted that his HR was up to 180 on those three days PTA. The day prior to admit, the pt had increased pain but didn;t take any extra opioids - just his usual oxycodone 10/325 QID. The day of admit, the wife knew something was wrong. He then started say "I hurt" but not in his usual voice. Dr Saralyn Pilar got the story that he had crushing CP but that was not voiced today by the pt or wife. He was lying on couch and whole cody started shaking intermittently for 20-30 min. First responders were present who knew the pt but per wife pt was not responsive to the first responders. She recalled that they also mentioned that his HR was increased. Per wife, pt was at baseline by the evening (these events occurred about 11 AM). Currently, pt feels globally weak.  At baseline, pt has trouble with fatigue with any level of exertion. When walking around [place], his whole body gives out and he gets weak and tired and has to sit down. He occassionally gets SOB.   SOC HX : pt married about 2 yrs ago. Quit tobacco about 2.5 yrs ago at hiw (now) wife's insistence. Lives on father in law's farm and the FIL lives next door. Was [employer] architect for 20 yrs.   Pmhx, meds, allergies, soc hx, fam hx reviewed  Filed Vitals:   03/07/12 1926 03/08/12 0648 03/08/12 0650 03/08/12 0651  BP: 124/79 123/83 111/77 120/80  Pulse: 93 99 96 98  Temp: 97.3 F (36.3 C) 98.2 F (36.8 C)    TempSrc: Oral Oral    Resp: 18 18    Height: 6\' 2"  (1.88 m)     Weight: 231 lb 4.2  oz (104.9 kg) 230 lb 9.6 oz (104.6 kg)    SpO2: 100% 98%     Gen : NAD, tearful when discussing recent health issues HRRR no MRG No carotid bruits ABD benign EXT + 2 pulses B, warm, no edema Skin : no rashes Tender over L shoulder anteriorly. NEURO : A&O, I believe a lot of the deficits are effort dependent as my exam is contradictory to an exam less than an hour earlier and in the ER. R UE 4+ / 5. LUE grip and elbow flexion / extension 4/5. Shoulder extension 0/5. Would not lift is off the bed. RLE 4+/5. LLE ankle dorsiflexion and extension 4+/5. L hip flexion 4/-/5 but there was not a compensatory R LE pressure under my hand.   Labs and imaging reviewed  Assessment and Plan: I agree with the formulated Assessment and Plan with the following changes:   1. Weakness - Initial CT head was negative. Pt cannot get MRI 2/2 ICD. Will repeat CT scan in AM to ensure that the initial CT scan did not miss an earlier lesion. He does have risk factors - HTN, DM, & prior tobacco use. Will cont ASA 81 mg QD. Ask PT/OT to eval. No need to pursue Carotids dopplers / ECHO unless the repeat CT head shows an  acute infarct. He denies increase opioid usage and UDS is appropriate so ingestion would not explain the sxs.   Dr Dorthula Rue has revaluated pt and also detected L shoulder weakness. Pt is not a tPA candidate as these sxs started three days prior to admit. His initial CT was negative for ischemic CVA despite three days of sxs. His in on an ASA and his LDL is 19??? Doing a CT now will not change mgmt as pt is not a tPA candidate as sxs (L arm weakness and heaviness) present now for 4 days and was last "nl" (no L arm weakness / heaviness) 4 days ago. His BP was not lowered quickly - came in at 108/66, max since admit 128 systolic, and now at 125/78. He is on one of his two BP meds. Will repeat CT head in AM. Cont ASA. Keep BP at current level but would not lower any further.  2. CP - pt has had three negative CE &  an EKG without ischemic changes and has R/O for ACS. Cath in 2009 showed nl coronary arteries. He is on an ASA and BB. No further cardiac W/U indicated at this time. He has chronic pain at the site of the ICD that is unchanged and does not need investigated at this point. He has known erosive gastritis and is on a PPI and carafate. Other GI possibilities are motility D/O like diffuse eso spasm but already has an out GI and does not need intpt W/U.   3. Shaking / tremor - it is difficult to know what this represents as I only have the wife's account. Pt has no sz hx and has no head trauma nor CT abnl that would be a foci. We are interrogating the ICD as wife relates that his HR was elevated and if his ICD had fired, that would explain what the saw observed. (Card has determined that interrogation is not required. If it had fired, then his CE would likely have been elevated and all three sets were negative so it is highly unlikely that the ICD fired and therefore an explanation for these sxs). We will observe him an additional 24 hour. If all nl, I do not feel an EEG would contribute to his care due to low pre-test prob and low sens of the test. Severe pain can certainly cause an altered mental status and shaking. Cont home dose oxycodone.  4. Shoulder pain - Will need to re-eval once pt willing / able to move LUE.  5. Erosive gastritis - cont PPI. Will cont outpt W/U. Could contribute to CP.   Dispo - will repeat CT in AM. PT / OT consult. Plan on D/C in AM if studies are nl.  Burns Spain, MD 7/31/201312:13 PM

## 2012-03-09 ENCOUNTER — Inpatient Hospital Stay (HOSPITAL_COMMUNITY): Payer: BC Managed Care – PPO

## 2012-03-09 DIAGNOSIS — R0989 Other specified symptoms and signs involving the circulatory and respiratory systems: Secondary | ICD-10-CM

## 2012-03-09 DIAGNOSIS — R29898 Other symptoms and signs involving the musculoskeletal system: Secondary | ICD-10-CM | POA: Diagnosis present

## 2012-03-09 LAB — GLUCOSE, CAPILLARY
Glucose-Capillary: 181 mg/dL — ABNORMAL HIGH (ref 70–99)
Glucose-Capillary: 211 mg/dL — ABNORMAL HIGH (ref 70–99)
Glucose-Capillary: 216 mg/dL — ABNORMAL HIGH (ref 70–99)

## 2012-03-09 LAB — BASIC METABOLIC PANEL
BUN: 9 mg/dL (ref 6–23)
Chloride: 102 mEq/L (ref 96–112)
GFR calc Af Amer: 75 mL/min — ABNORMAL LOW (ref >90)
GFR calc non Af Amer: 65 mL/min — ABNORMAL LOW (ref >90)
Glucose, Bld: 173 mg/dL — ABNORMAL HIGH (ref 70–99)
Potassium: 3.7 mEq/L (ref 3.5–5.1)

## 2012-03-09 MED ORDER — CLOPIDOGREL BISULFATE 75 MG PO TABS
75.0000 mg | ORAL_TABLET | Freq: Every day | ORAL | Status: DC
  Administered 2012-03-10: 75 mg via ORAL
  Filled 2012-03-09 (×2): qty 1

## 2012-03-09 MED ORDER — DIGOXIN 125 MCG PO TABS
125.0000 ug | ORAL_TABLET | Freq: Every day | ORAL | Status: DC
  Administered 2012-03-09 – 2012-03-10 (×2): 125 ug via ORAL
  Filled 2012-03-09 (×2): qty 1

## 2012-03-09 MED ORDER — LISINOPRIL 20 MG PO TABS
20.0000 mg | ORAL_TABLET | Freq: Every day | ORAL | Status: DC
  Administered 2012-03-09 – 2012-03-10 (×2): 20 mg via ORAL
  Filled 2012-03-09 (×2): qty 1

## 2012-03-09 NOTE — Consult Note (Signed)
[facility] CONSULT NOTE     Reason for Consult: left UE    HPI:    Mike Floyd is an 51 y.o. male with significant cardiac history of WPW s/p AICD placement, DM and HTN who was admitted to the hospital due to chest pain.  On admission there is also mention of "episode of diffuse body shaking while speaking and stating "it hurts" that was witnessed by his wife".  The patient was not able to recall episode and states he was confused until the following day. There was no incontinence or tongue biting.  There was question if convulsions were do to defibrillator malfunction as his symptoms had resolved by the time he arrived at the ED. Per notes , the morning of 7/31, his neurological exam improved with return of strength to 5/5 in his upper extremities. "However, later that day, his strength in his left arm became 3/5. On 8/1, he continues to have 4/5 strength in his left arm."Initial CT head was negative for stroke on 7/31 and repeat CT of head on 8/1 was again negative for intracranial abnormality. PAtient states his left leg is back to normal but feels his left arm is heavy.  Neurology was consulted for further recommendations on work up for seizure versus stroke.    Past Medical History  Diagnosis Date  . Diabetes mellitus DX: 2010  . Gout   . Hypertension   . CKD (chronic kidney disease), stage II   . Erosive esophagitis     a. per EGD (08/2011), Dr. Karilyn Cota - Erosive reflux esophagitis improved but not completely healed since previous EGD 3 years ago. Bx showing  ulcerated gatroesophageal junction mucosa. negative for H. pylori  . Nonischemic dilated cardiomyopathy     a. H/O EF as low as 35-40% by LV gram 10/2007;  b. Echo 02/2011 EF 50-55%, inf HK, Gr 1 DD   . DDD (degenerative disc disease), lumbar   . Hearing deficit     a. wear bilateral hearing aides  . AICD (automatic cardioverter/defibrillator) present 11/2007    a. 11/2007 [facility] Current [facility] - single  lead ICD  . Chest pain     a. 10/2007 Cath:  normal Cors.  . WPW (Wolff-Parkinson-White syndrome)     a. s/p RFCA @ [facility] - 1999    Past Surgical History  Procedure Date  . Back surgery     1995  . Appendectomy   . Cardiac defibrillator placement   . Cholecystectomy   . Ablation for svt     at [facility]  . Elbow surgery 2011, nov  . Cholecystectomy, laparoscopic     11/2007    Family History  Problem Relation Age of Onset  . Heart attack Mother 51  . Heart attack Father 66  . Heart attack Brother 55  . Hypertension Mother   . Hypertension Father   . Hypertension Brother   . Stroke Maternal Grandmother 80  . Diabetes Mother   . Diabetes Father   . Diabetes Brother     Social History:  reports that he quit smoking about 19 months ago. His smoking use included Cigarettes. He quit after 60 years of use. He has never used smokeless tobacco. He reports that he drinks about .6 ounces of alcohol per week. He reports that he does not use illicit drugs.  No Known Allergies  Medications:  Prior to Admission:  Prescriptions prior to admission  Medication Sig Dispense Refill  . allopurinol (ZYLOPRIM) 300 MG tablet Take 300 mg by mouth daily.       Marland Kitchen aspirin 81 MG chewable tablet Chew 81 mg by mouth daily.      . carvedilol (COREG) 25 MG tablet Take 25 mg by mouth 2 (two) times daily with a meal.      . digoxin (LANOXIN) 0.125 MG tablet Take 125 mcg by mouth daily.       Marland Kitchen lisinopril (PRINIVIL,ZESTRIL) 20 MG tablet Take 20 mg by mouth daily.      Marland Kitchen oxyCODONE-acetaminophen (PERCOCET) 10-325 MG per tablet Take 1 tablet by mouth every 6 (six) hours as needed. For pain      . pantoprazole (PROTONIX) 40 MG tablet Take 40 mg by mouth 2 (two) times daily.       . sucralfate (CARAFATE) 1 G tablet Take 1 g by mouth 4 (four) times daily.       Scheduled:   . allopurinol  300 mg Oral Daily  . aspirin  81 mg Oral Daily  . carvedilol  25 mg Oral BID WC  . digoxin  125 mcg Oral  Daily  . heparin  5,000 Units Subcutaneous Q8H  . insulin aspart  0-9 Units Subcutaneous TID WC  . lisinopril  20 mg Oral Daily  . pantoprazole  40 mg Oral Q1200  . sodium chloride  3 mL Intravenous Q12H  . sucralfate  1 g Oral QID    Review of Systems - General ROS: negative for - chills, fatigue, fever or hot flashes Hematological and Lymphatic ROS: negative for - bruising, fatigue, jaundice or pallor Endocrine ROS: negative for - hair pattern changes, hot flashes, mood swings or skin changes Respiratory ROS: negative for - cough, hemoptysis, orthopnea or wheezing Cardiovascular ROS: negative for - dyspnea on exertion, orthopnea, palpitations or shortness of breath Gastrointestinal ROS: negative for - abdominal pain, appetite loss, blood in stools, diarrhea or hematemesis Musculoskeletal ROS: negative for - joint pain, joint stiffness, joint swelling or muscle pain Neurological ROS: positive for - weakness Dermatological ROS: negative for dry skin, pruritus and rash   Blood pressure 121/78, pulse 88, temperature 98.4 F (36.9 C), temperature source Oral, resp. rate 19, height 6\' 2"  (1.88 m), weight 104.4 kg (230 lb 2.6 oz), SpO2 98.00%.   Neurologic Examination:   Mental Status: Alert, oriented, thought content appropriate.  Speech fluent without evidence of aphasia.  Able to follow 3 step commands without difficulty. Cranial Nerves: II: visual fields grossly normal, pupils equal, round, reactive to light and accommodation III,IV, VI: ptosis not present, extraocular muscles extra-ocular motions intact bilaterally V,VII: smile symmetric, facial light touch sensation normal bilaterally VIII: hearing normal bilaterally IX,X: gag reflex present XI: trapezius strength/neck flexion strength normal bilaterally XII: tongue strength normal  Motor: Right : Upper extremity    Left:     Upper extremity 5/5 deltoid       5/5 deltoid (giveway weakness) 5/5 tricep      5/5 tricep (Giveway  weakness) 5/5 biceps      5/5 biceps  5/5wrist flexion     5/5 wrist flexion 5/5 wrist extension     5/5 wrist extension 5/5 hand grip      5/5 hand grip  Lower extremity     Lower extremity 5/5 hip flexor      5/5 hip flexor 5/5 hip adductors     5/5 hip adductors 5/5  hip abductors     5/5 hip abductors 5/5 quadricep      5/5 quadriceps  5/5 hamstrings     5/5 hamstrings 5/5 plantar flexion       5/5 plantar flexion 5/5 plantar extension     5/5 plantar extension Tone and bulk:normal tone throughout; no atrophy noted Sensory: Pinprick and light touch intact throughout, bilaterally Deep Tendon Reflexes: 2+ and symmetric throughout Plantars: Right: downgoing   Left: downgoing Cerebellar: normal finger-to-nose --he slowly will move his hand to his nose with hs left finger but shows no dysmetria.  Normal heel-to-shin test   Lab Results  Component Value Date/Time   CHOL 110 03/08/2012  6:35 AM    Results for orders placed during the hospital encounter of 03/07/12 (from the past 48 hour(s))  LIPASE, BLOOD     Status: Normal   Collection Time   03/07/12  3:37 PM      Component Value Range Comment   Lipase 22  11 - 59 U/L   URINE RAPID DRUG SCREEN (HOSP PERFORMED)     Status: Abnormal   Collection Time   03/07/12  4:34 PM      Component Value Range Comment   Opiates POSITIVE (*) NONE DETECTED    Cocaine NONE DETECTED  NONE DETECTED    Benzodiazepines NONE DETECTED  NONE DETECTED    Amphetamines NONE DETECTED  NONE DETECTED    Tetrahydrocannabinol NONE DETECTED  NONE DETECTED    Barbiturates NONE DETECTED  NONE DETECTED   URINALYSIS, WITH MICROSCOPIC     Status: Abnormal   Collection Time   03/07/12  4:34 PM      Component Value Range Comment   Color, Urine YELLOW  YELLOW    APPearance CLEAR  CLEAR    Specific Gravity, Urine 1.021  1.005 - 1.030    pH 7.0  5.0 - 8.0    Glucose, UA 250 (*) NEGATIVE mg/dL    Hgb urine dipstick NEGATIVE  NEGATIVE    Bilirubin Urine NEGATIVE   NEGATIVE    Ketones, ur 15 (*) NEGATIVE mg/dL    Protein, ur NEGATIVE  NEGATIVE mg/dL    Urobilinogen, UA 0.2  0.0 - 1.0 mg/dL    Nitrite NEGATIVE  NEGATIVE    Leukocytes, UA NEGATIVE  NEGATIVE    WBC, UA 0-2  <3 WBC/hpf    Squamous Epithelial / LPF RARE  RARE    Casts HYALINE CASTS (*) NEGATIVE   SODIUM, URINE, RANDOM     Status: Normal   Collection Time   03/07/12  4:34 PM      Component Value Range Comment   Sodium, Ur 52     CREATININE, URINE, RANDOM     Status: Normal   Collection Time   03/07/12  4:34 PM      Component Value Range Comment   Creatinine, Urine 71.18     URIC ACID     Status: Normal   Collection Time   03/07/12  5:00 PM      Component Value Range Comment   Uric Acid, Serum 5.5  4.0 - 7.8 mg/dL   HEMOGLOBIN W0J     Status: Abnormal   Collection Time   03/07/12  5:00 PM      Component Value Range Comment   Hemoglobin A1C 6.5 (*) <5.7 %    Mean Plasma Glucose 140 (*) <117 mg/dL   TSH     Status: Normal   Collection Time   03/07/12  8:35  PM      Component Value Range Comment   TSH 0.579  0.350 - 4.500 uIU/mL   CARDIAC PANEL(CRET KIN+CKTOT+MB+TROPI)     Status: Abnormal   Collection Time   03/07/12  8:35 PM      Component Value Range Comment   Total CK 116  7 - 232 U/L    CK, MB 5.5 (*) 0.3 - 4.0 ng/mL    Troponin I <0.30  <0.30 ng/mL    Relative Index 4.7 (*) 0.0 - 2.5   DIGOXIN LEVEL     Status: Abnormal   Collection Time   03/07/12  8:35 PM      Component Value Range Comment   Digoxin Level 0.4 (*) 0.8 - 2.0 ng/mL   GLUCOSE, CAPILLARY     Status: Abnormal   Collection Time   03/07/12  9:42 PM      Component Value Range Comment   Glucose-Capillary 213 (*) 70 - 99 mg/dL    Comment 1 Documented in Chart      Comment 2 Notify RN     CARDIAC PANEL(CRET KIN+CKTOT+MB+TROPI)     Status: Abnormal   Collection Time   03/08/12 12:55 AM      Component Value Range Comment   Total CK 102  7 - 232 U/L    CK, MB 4.9 (*) 0.3 - 4.0 ng/mL    Troponin I <0.30  <0.30  ng/mL    Relative Index 4.8 (*) 0.0 - 2.5   CARDIAC PANEL(CRET KIN+CKTOT+MB+TROPI)     Status: Abnormal   Collection Time   03/08/12  6:35 AM      Component Value Range Comment   Total CK 87  7 - 232 U/L    CK, MB 4.8 (*) 0.3 - 4.0 ng/mL    Troponin I <0.30  <0.30 ng/mL    Relative Index RELATIVE INDEX IS INVALID  0.0 - 2.5   LIPID PANEL     Status: Abnormal   Collection Time   03/08/12  6:35 AM      Component Value Range Comment   Cholesterol 110  0 - 200 mg/dL    Triglycerides 161 (*) <150 mg/dL    HDL 19 (*) >09 mg/dL    Total CHOL/HDL Ratio 5.8      VLDL 72 (*) 0 - 40 mg/dL    LDL Cholesterol 19  0 - 99 mg/dL   GLUCOSE, CAPILLARY     Status: Abnormal   Collection Time   03/08/12  6:37 AM      Component Value Range Comment   Glucose-Capillary 148 (*) 70 - 99 mg/dL   GLUCOSE, CAPILLARY     Status: Abnormal   Collection Time   03/08/12 11:22 AM      Component Value Range Comment   Glucose-Capillary 212 (*) 70 - 99 mg/dL    Comment 1 Notify RN     GLUCOSE, CAPILLARY     Status: Abnormal   Collection Time   03/08/12  4:41 PM      Component Value Range Comment   Glucose-Capillary 160 (*) 70 - 99 mg/dL    Comment 1 Documented in Chart      Comment 2 Notify RN     GLUCOSE, CAPILLARY     Status: Abnormal   Collection Time   03/08/12  9:36 PM      Component Value Range Comment   Glucose-Capillary 196 (*) 70 - 99 mg/dL    Comment 1 Documented in Chart  Comment 2 Notify RN     BASIC METABOLIC PANEL     Status: Abnormal   Collection Time   03/09/12  4:55 AM      Component Value Range Comment   Sodium 140  135 - 145 mEq/L    Potassium 3.7  3.5 - 5.1 mEq/L    Chloride 102  96 - 112 mEq/L    CO2 28  19 - 32 mEq/L    Glucose, Bld 173 (*) 70 - 99 mg/dL    BUN 9  6 - 23 mg/dL    Creatinine, Ser 6.96  0.50 - 1.35 mg/dL    Calcium 9.1  8.4 - 29.5 mg/dL    GFR calc non Af Amer 65 (*) >90 mL/min    GFR calc Af Amer 75 (*) >90 mL/min   GLUCOSE, CAPILLARY     Status: Abnormal    Collection Time   03/09/12  6:46 AM      Component Value Range Comment   Glucose-Capillary 181 (*) 70 - 99 mg/dL   GLUCOSE, CAPILLARY     Status: Abnormal   Collection Time   03/09/12 11:14 AM      Component Value Range Comment   Glucose-Capillary 211 (*) 70 - 99 mg/dL    Comment 1 Notify RN       Ct Head Wo Contrast  03/09/2012  *RADIOLOGY REPORT*  Clinical Data:  Left-sided weakness  CT HEAD WITHOUT CONTRAST  Technique:  Contiguous axial images were obtained from the base of the skull through the vertex without contrast  Comparison:  03/07/2012  Findings:  The brain has a normal appearance without evidence for hemorrhage, acute infarction, hydrocephalus, or mass lesion.  There is no extra axial fluid collection.  The skull and paranasal sinuses are normal.  IMPRESSION: Normal CT of the head without contrast.  Original Report Authenticated By: Judie Petit. Ruel Favors, M.D.   Ct Head Wo Contrast  03/07/2012  *RADIOLOGY REPORT*  Clinical Data: Upper extremity weakness.  Memory loss.  Confusion.  CT HEAD WITHOUT CONTRAST  Technique:  Contiguous axial images were obtained from the base of the skull through the vertex without contrast.  Comparison: None  Findings: The study is performed following contrast enhanced exam of the chest.  There is residual contrast in the blood vessels.  There is no intra or extra-axial fluid collection or mass lesion. The basilar cisterns and ventricles have a normal appearance. There is no CT evidence for acute infarction or hemorrhage.  Bone windows show no calvarial fracture.  The visualized paranasal sinuses are well-aerated.  IMPRESSION: No evidence for acute  abnormality.  Original Report Authenticated By: Patterson Hammersmith, M.D.   Dg Chest Port 1 View  03/07/2012  *RADIOLOGY REPORT*  Clinical Data: Severe chest pain.  Shortness of breath.  Ex-smoker. Diabetes.  Hypertension.  PORTABLE CHEST - 1 VIEW  Comparison: 03/30/2011.  Findings: The heart remains normal in size.  The  patient is currently rotated to the left.  The patient's chin is obscuring the medial right lung apex.  The visualized lungs are clear.  The interstitial markings are mildly prominent without Kerley lines. No pleural fluid.  Thoracic spine degenerative changes.  IMPRESSION: Mild chronic interstitial lung disease.  No acute abnormality.  Original Report Authenticated By: Darrol Angel, M.D.   Ct Angio Chest Aortic Dissect W &/or W/o  03/07/2012  *RADIOLOGY REPORT*  Clinical Data:  Left chest pain and shortness of breath.  Clinical concern for aortic dissection.  CT ANGIOGRAPHY CHEST WITHOUT AND WITH CONTRAST  Technique:  Multidetector CT imaging of the chest was performed using the standard protocol before and during bolus administration of intravenous contrast.  Multiplanar CT image reconstructions including MIPs were obtained to evaluate the vascular anatomy.  Contrast: 80mL OMNIPAQUE IOHEXOL 350 MG/ML SOLN  Comparison:  Portable chest obtained earlier today.  Findings:  Minimal atheromatous change in the thoracic aorta without aneurysm or dissection.  A left subclavian pacemaker lead is demonstrated with its tip at the right ventricular apex.  The lungs are clear.  No lung masses or enlarged lymph nodes.  The pulmonary arteries are normally opacified with no pulmonary arterial filling defects seen.  Thoracic spine degenerative changes.  The upper abdomen is unremarkable.  Review of the MIP images confirms the above findings.  IMPRESSION: No aortic dissection, pulmonary embolism or other acute abnormality.  Original Report Authenticated By: Darrol Angel, M.D.     Assessment/Plan:   51 YO male with left arm weakness after admission for CP with negative cardiac workup.  Patient did have episode of bilateral shaking prior to arrival in ED in which he was stated to be "repeating" but not having conversant speech during episode. He showed normal strength after admission and later developed left sided arm and  legs weakness.  This is not necessarily c/w a todd's paralysis but can not rule out the possibility of a TIA/infarct. On exam patient shows 5/5 strength throughout with decreased effort, mild giveaway weakness and inconsistencies when testing left arm.  CT head x2 has been negative for intracranial abnormalities. MRI of the brain unable to be performed.   Due to the fact that the patient has multiple risk factors for stroke, will treat accordingly.    Recommend: 1) PT/OT 2) If medically appropriate due to unhealed ulcerated gatroesophageal junction mucosa would consider discontinuation of ASA and start of Plavix 75mg  daily.   3) Carotid doppler and Echo results pending.  4) EEG.    Felicie Morn PA-C [facility] [phone]  03/09/2012, 1:39 PM   Patient seen and examined.  Clinical course and management discussed.  Necessary edits performed.  I agree with the above.  Thana Farr, MD [facility] [phone]  03/09/2012  4:09 PM

## 2012-03-09 NOTE — Progress Notes (Signed)
INTERNAL MEDICINE TEACHING SERVICE Attending Note  Date: 03/09/2012  Patient name: Mike Floyd  Medical record number: [id number]  Date of birth: Sep 19, 1960    This patient has been seen and discussed with the house staff. Please see their note for complete details. I concur with their findings with the following additions/corrections: Discussed case with patient and his wife in detail. He reports having episode of CP at home and possibly seizure activity as well as slurred speech/LUE/LLE weakness at the time that has improved. Currently he states his LUE and LLE are improved strength wise since admission. He denies further pressure like CP. Denies SOB. Denies any speech deficits.   A/P: Patient is a 51 y.o., male with a PMHX of Type 2 DM, HTN, NICM EF 55%, WPW s/p AICD placement, GERD,  presents with CP and LUE/LLE weakness, possible seizure activity.  1. LUE/LLE weakness/speech deficits/possible seizure and possible CVA: At this time on my exam he has notable residual weakness on his LUE/LLE (3/5 strength). Given his medical hx, this is suspicious for recent ischemic CVA. Neuro consult needed. Repeat CT brain w/o contrast does not show evidence of CVA but he is not a candidate for MRI at this time. Obtain carotid U/S and repeat TTE. Continue ASA for now. His initial history and description by his wife is concerning for seizure activity. Observe and neuro consult. 2. CP: No evidence for ACS. Will need f/u with cardiology.   Jonah Blue, DO  03/09/2012, 1:56 PM

## 2012-03-09 NOTE — Progress Notes (Signed)
RT talked to pt about wearing CPAP tonight. He stated that he was not going to wear it and also stated he told the doctor as well. RT told pt to call if he changed his mind. RT to continue to monitor.

## 2012-03-09 NOTE — Progress Notes (Signed)
Medical Student Daily Progress Note  Subjective: Mr. Haddon reports that he is feeling much better today.  His feels that his vision is improved in his left eye and he is regaining strength in his left arm.  He has been eating and sleeping well.  He reports no other problems at this time.   Objective: Vital signs in last 24 hours: Filed Vitals:   03/08/12 0651 03/08/12 1400 03/08/12 1954 03/09/12 0519  BP: 120/80 125/78 127/80 121/78  Pulse: 98 95 100 88  Temp:  98 F (36.7 C) 98.3 F (36.8 C) 98.4 F (36.9 C)  TempSrc:  Oral Oral Oral  Resp:  20 20 19   Height:      Weight:    104.4 kg (230 lb 2.6 oz)  SpO2:  99% 98% 98%   Weight change: 0.254 kg (9 oz)  Intake/Output Summary (Last 24 hours) at 03/09/12 0841 Last data filed at 03/08/12 1956  Gross per 24 hour  Intake    840 ml  Output    950 ml  Net   -110 ml   Physical Exam: General appearance: alert and cooperative Eyes: conjunctivae/corneas clear. PERRL, EOM's intact. Fundi benign. Lungs: clear to auscultation bilaterally Chest wall: no tenderness, left sided chest wall tenderness Heart: regular rate and rhythm, S1, S2 normal, no murmur, click, rub or gallop Abdomen: soft, non-tender; bowel sounds normal; no masses,  no organomegaly Extremities: extremities normal, atraumatic, no cyanosis or edema Pulses: 1+ pedal pulses Neurologic: Cranial nerves: normal Sensory: continued decreased sensation to light touch on left upper arm, but much improved from yesterday Motor: 4/5 in left upper extremity Lab Results: BMET    Component Value Date/Time   NA 140 03/09/2012 0455   K 3.7 03/09/2012 0455   CL 102 03/09/2012 0455   CO2 28 03/09/2012 0455   GLUCOSE 173* 03/09/2012 0455   BUN 9 03/09/2012 0455   CREATININE 1.25 03/09/2012 0455   CALCIUM 9.1 03/09/2012 0455   GFRNONAA 65* 03/09/2012 0455   GFRAA 75* 03/09/2012 0455    Micro Results: No results found for this or any previous visit (from the past 240 hour(s)). Studies/Results: Ct  Head Wo Contrast  03/07/2012  *RADIOLOGY REPORT*  Clinical Data: Upper extremity weakness.  Memory loss.  Confusion.  CT HEAD WITHOUT CONTRAST  Technique:  Contiguous axial images were obtained from the base of the skull through the vertex without contrast.  Comparison: None  Findings: The study is performed following contrast enhanced exam of the chest.  There is residual contrast in the blood vessels.  There is no intra or extra-axial fluid collection or mass lesion. The basilar cisterns and ventricles have a normal appearance. There is no CT evidence for acute infarction or hemorrhage.  Bone windows show no calvarial fracture.  The visualized paranasal sinuses are well-aerated.  IMPRESSION: No evidence for acute  abnormality.  Original Report Authenticated By: Patterson Hammersmith, M.D.   Dg Chest Port 1 View  03/07/2012  *RADIOLOGY REPORT*  Clinical Data: Severe chest pain.  Shortness of breath.  Ex-smoker. Diabetes.  Hypertension.  PORTABLE CHEST - 1 VIEW  Comparison: 03/30/2011.  Findings: The heart remains normal in size.  The patient is currently rotated to the left.  The patient's chin is obscuring the medial right lung apex.  The visualized lungs are clear.  The interstitial markings are mildly prominent without Kerley lines. No pleural fluid.  Thoracic spine degenerative changes.  IMPRESSION: Mild chronic interstitial lung disease.  No acute abnormality.  Original Report Authenticated By: Darrol Angel, M.D.   Ct Angio Chest Aortic Dissect W &/or W/o  03/07/2012  *RADIOLOGY REPORT*  Clinical Data:  Left chest pain and shortness of breath.  Clinical concern for aortic dissection.  CT ANGIOGRAPHY CHEST WITHOUT AND WITH CONTRAST  Technique:  Multidetector CT imaging of the chest was performed using the standard protocol before and during bolus administration of intravenous contrast.  Multiplanar CT image reconstructions including MIPs were obtained to evaluate the vascular anatomy.  Contrast: 80mL  OMNIPAQUE IOHEXOL 350 MG/ML SOLN  Comparison:  Portable chest obtained earlier today.  Findings:  Minimal atheromatous change in the thoracic aorta without aneurysm or dissection.  A left subclavian pacemaker lead is demonstrated with its tip at the right ventricular apex.  The lungs are clear.  No lung masses or enlarged lymph nodes.  The pulmonary arteries are normally opacified with no pulmonary arterial filling defects seen.  Thoracic spine degenerative changes.  The upper abdomen is unremarkable.  Review of the MIP images confirms the above findings.  IMPRESSION: No aortic dissection, pulmonary embolism or other acute abnormality.  Original Report Authenticated By: Darrol Angel, M.D.   Medications: I have reviewed the patient's current medications. Scheduled Meds:   . allopurinol  300 mg Oral Daily  . aspirin  81 mg Oral Daily  . carvedilol  25 mg Oral BID WC  . heparin  5,000 Units Subcutaneous Q8H  . insulin aspart  0-9 Units Subcutaneous TID WC  . pantoprazole  40 mg Oral Q1200  . sodium chloride  3 mL Intravenous Q12H  . sucralfate  1 g Oral QID  . DISCONTD: pantoprazole (PROTONIX) IV  40 mg Intravenous Q12H   Continuous Infusions:  PRN Meds:.morphine injection, oxyCODONE, oxyCODONE-acetaminophen Assessment/Plan: Patient is a 51 year old male with a PMHx of diet controlled DMII, HTN, nonischemic cardiomyopathy with last LV EF 55% (previously as low as 35%) and grade 1 diastolic dysfunction, history of WPW s/p AICD placement), erosive esophagitis, who is admitted to [facility] on 03/07/2012 with 2-3 day history of chest pain, which at this time is still of unclear etiology. CTA chest in ER was negative for evidence of PE, pneumothorax, significant pericardiac effusion (that could be observed at least), pneumonia. Interventions at this time will be focused on better understanding etiology of Mr. Taquan Bralley Inda's chest pain.  1) Chest pain - The patient's history of severe pressure-type chest pain  with radiation to BL arms in the setting of his cardiac risk factors including DMII, HTN, obesity, age, and remote tobacco abuse is certainly concerning for cardiac source. However, at this time, his chest pain is less likely cardiac in nature given negative cardiac enzymes x3, normal EKG, reproducible pain with palpation, normal cardiac cath in 2009, and lack of response to nitroglycerin. His TSH level is wnl at 0.6. Cardiology consult feels that this is also likely not cardiac in nature, but would still like to investigate his AICD at his follow up appointment later this month. Other differential diagnoses include pain secondary to pulmonary hypertension in setting of uncontrolled OSA (does not tolerate CPAP) - although no clear evidence of resultant right heart failure on exam). It is still unclear if possible defibrillator malfunctioning is allowing for possible arrhythmias to contribute towards presenting symptoms. However, this is less likely given his normal troponins which would have elevated if he had received a shock.  As well, he has history of erosive esophagitis (although would not explain his constellation of  arm weakness, etc). Of note, he has been complaining of increased trouble swallowing food lately which could indicate a return of his problem with esophogeal narrowing. Other life-threatening differentials including aortic dissection, aneurysm, PE have been ruled out by CTA Chest. At this time, a musculoskeletal problem is the most likely cause, given that the pain is reproducible with palpation.   Plan:  - Continue on telemetry unit  - Continue aspirin, beta blocker, morphine PRN  - Continue PPI for esophagitis.    2) Acute on chronic kidney injury - On admission, patient noted to have mild AKI with admission SCr of 1.4. Upon further review, this is within his baseline range based on previous visits. FeNa is < 1 and urine ketones suggesting prerenal etiology, possibly in the setting of  volume depletion (? Insensible losses from "convulsions" versus possible osmotic diuresis in setting of uncontrolled DMII (states he is not compliant with home regimen). Lastly, the patient states he has baseline renal insufficiency, although he cannot elaborate. He has shown no signs of orthostatic hypotension. His creatinine continues to lower, specifically 1.25 this morning. Plan:  - Recheck BMET   3) UE weakness/ slurred speech - unclear etiology, had resolved by time of evaluation. Again, it is unclear if this is associated with the chest pain or possible defibrillator malfunctioning. Given his history of DMII, smoking, HTN, low HLD he is also at risk for stroke or TIA. However, head CT from yesterday shows no acute processes. Cannot get an MRI secondary to AICD - as well, his symptoms are resolved at the time of evaluation. The morning of 7/31, his neurological exam improved with return of strength to 5/5 in his upper extremities. However, later that day, his strength in his left arm became 3/5.  On 8/1, he continues to have 4/5 strength in his left arm.  His decreased sensation on his left side are still worrisome for brain ischemia. Repeat head CT performed on 8/1 was negative for any acute processes.  Upon further review, his presentation to the ED with confusion and reported convulsions are worrisome for a possible seizure, possibly secondary to a TIA or stroke. Plan:  - Continue to perform neuro checks.  - Neurology consult placed, appreciate assistance - Will obtain carotid dopplers and CTE with bubble agitation - Will repeat lipid panel given very low LDL and HDL levels (19 each)   4) Nonischemic cardiomyopathy - last echo in 03/2011 showing LV EF 50-55% with hypokinesis of the inferior myocardium and grade 1 diastolic dysfunction. Prior Echo in 02/2008 showing systolic function that is moderately depressed. Patient is s/p AICD placement in 11/2007 for cardiac arrhythmias and EF < 30%.  Specifically, [employer] CURRENT at [place] VR RF single chamber cardioverter defibrillator with defibrillator threshold study less than or equal to 25 joules. Has not had interrogation in 8-12 months per patient.  Plan:  - Re-start lisinopril given lowering creatinine.  - Re-start digoxin - Continue beta blocker.  - Daily weight, strict I/O  5) Erosive esophagitis - last EGD 08/2011 showing unhealed erosive esophagitis, patient had an esophageal dilatation at that time as well, with improved dysphagia to solids and liquids. However, he states that his trouble swallowing his been increasing over the past few months, indicating a possible narrowing of his esophagus.  Plan:  - Continue PO PPI.  - Continue sucralfate.  - Consider follow up with GI to evaluate possible re-narrowing of esophagus and potential ulcer as a cause of recurrent chest pain (patient's insurance  runs out today, will hopefully have disability within the next few months.)  6) HTN - well controlled, stable at present. Plan:  - Continue carvedilol and lisinopril at home dose.  7) Gout - on chronic allopurinol, no documentation of crystal testing. No indication of acute attack. Uric acid level is 5.5.  Plan:  - Continue allopurinol, escalate dosage if appropriate for goal < 6.  8) Chronic lumbar pain secondary to DDD - follows at pain management clinic.  Plan:  - Continue home regimen.  9) DMII - unknown baseline control. Patient has not had his diabetes medication (unknown) filled in over two months due to cost issues. His A1C on admission was 6.5 indicating appropriate control.  Plan:  - SSI-S  Active Problems:  * No active hospital problems. *    LOS: 2 days   This is a Psychologist, occupational Note.  The care of the patient was discussed with Dr. Dorthula Rue and the assessment and plan formulated with their assistance.  Please see their attached note for official documentation of the daily encounter.  Wende Bushy 03/09/2012,  8:41 AM Resident Co-sign Daily Note: I have seen the patient and reviewed the daily progress note by Baltazar Apo MS IV and discussed the care of the patient with them.  See below for documentation of my findings, assessment, and plans.  Subjective:  He reports feeling better- states that his chest pain is 2/10 . Blurred vision is getting better.  Objective: Vital signs in last 24 hours: Filed Vitals:   03/08/12 0651 03/08/12 1400 03/08/12 1954 03/09/12 0519  BP: 120/80 125/78 127/80 121/78  Pulse: 98 95 100 88  Temp:  98 F (36.7 C) 98.3 F (36.8 C) 98.4 F (36.9 C)  TempSrc:  Oral Oral Oral  Resp:  20 20 19   Height:      Weight:    230 lb 2.6 oz (104.4 kg)  SpO2:  99% 98% 98%   Physical Exam: BP 121/78  Pulse 88  Temp 98.4 F (36.9 C) (Oral)  Resp 19  Ht 6\' 2"  (1.88 m)  Wt 230 lb 2.6 oz (104.4 kg)  BMI 29.55 kg/m2  SpO2 98%  General Appearance:    Alert, cooperative, no distress, appears stated age  Head:    Normocephalic, without obvious abnormality, atraumatic  Eyes:    PERRL, conjunctiva/corneas clear, EOM's intact, fundi    benign, both eyes       Ears:    Normal TM's and external ear canals, both ears  Nose:   Nares normal, septum midline, mucosa normal, no drainage    or sinus tenderness  Throat:   Lips, mucosa, and tongue normal; teeth and gums normal  Neck:   Supple, symmetrical, trachea midline, no adenopathy;       thyroid:  No enlargement/tenderness/nodules; no carotid   bruit or JVD  Back:     Symmetric, no curvature, ROM normal, no CVA tenderness  Lungs:     Clear to auscultation bilaterally, respirations unlabored  Chest wall:    No tenderness or deformity  Heart:    Regular rate and rhythm, S1 and S2 normal, no murmur, rub   or gallop  Abdomen:     Soft, non-tender, bowel sounds active all four quadrants,    no masses, no organomegaly  Genitalia:    Normal male without lesion, discharge or tenderness  Rectal:    Normal tone, normal prostate, no  masses or tenderness;   guaiac negative stool  Extremities:  Extremities normal, atraumatic, no cyanosis or edema  Pulses:   2+ and symmetric all extremities  Skin:   Skin color, texture, turgor normal, no rashes or lesions  Lymph nodes:   Cervical, supraclavicular, and axillary nodes normal  Neurologic:   CNII-XII intact. Normal strength, sensation and reflexes      throughout   Lab Results: Reviewed and documented in Electronic Record Micro Results: Reviewed and documented in Electronic Record Studies/Results: Reviewed and documented in Electronic Record Medications: I have reviewed the patient's current medications. Scheduled Meds:   . allopurinol  300 mg Oral Daily  . aspirin  81 mg Oral Daily  . carvedilol  25 mg Oral BID WC  . digoxin  125 mcg Oral Daily  . heparin  5,000 Units Subcutaneous Q8H  . insulin aspart  0-9 Units Subcutaneous TID WC  . lisinopril  20 mg Oral Daily  . pantoprazole  40 mg Oral Q1200  . sodium chloride  3 mL Intravenous Q12H  . sucralfate  1 g Oral QID   Continuous Infusions:  PRN Meds:.morphine injection, oxyCODONE, oxyCODONE-acetaminophen Assessment/Plan: 1) Chest pain - likely musculoskeletal in origin( chest wall tender to palpation on the left side). Other possibility includes GI origin given his history of erosive reflux esophagitis. Cardiac etiology appears to be less likely. His cardiac enzymes x3 were negative and EKG did not demonstrate any acute ischemic changes. We appreciate cardiology inputs!  Plan:  - Aspirin, beta blocker, morphine PRN  - Continue PPI and sucralfate for esophagitis.  2) Acute on chronic kidney injury - Baseline around 1.3. Close to his baseline. Resolved - Continue to monitor  - BMET in AM  3) Convulsions/ decreased sensation son the left - Wife reports an episode of convulsions with speech changes yesterday unclear etiology, has resolved by time of evaluation. Again, unclear the association of the chest pain,  possible defibrillator malfunctioning? Given history of DMII, smoking, HTN, HLD he is also at risk for stroke or TIA. Cannot get an MRI secondary to AICD - as well, his symptoms are resolved at the time of evaluation. Repeat CT head this AM did not show any evidence of stroke but given his LUE weakness, would get neuro consult and studies including Carotid dopplers and 2D echo with bubble study. - Neuro consult  - F/U carotid dopplers and 2 d echo.  4) Nonischemic cardiomyopathy - last echo in 03/2011 showing LV EF 50-55% with hypokinesis of the inferior myocardium and grade 1 diastolic dysfunction. Prior Echo in 02/2008 showing systolic function that is moderately depressed. Patient is s/p AICD placement in 11/2007 for cardiac arrhythmias and EF < 30%. Specifically, [employer] Jude CURRENT at [place] VR RF single chamber cardioverter defibrillator with  defibrillator threshold study less than or equal to 25 joules. Has not had interrogation in 8-12 months per patient.  - Restart lisinopril and digoxin   - Continue beta blocker.  - Daily weight, strict I/O  5) Erosive esophagitis - last EGD 08/2011 showing unhealed erosive esophagitis, patient had an esophageal dilatation at that time as well, with improved dysphagia to solids and liquids.  - Continue protonix and sucralfate.  6) HTN - well controlled, stable at present.  - Continue metoprolol and lisinopril at home dose.  7) Gout - on chronic allopurinol, no documentation of crystal testing( uric acid -5.5). No indication of acute attack.  - Continue allopurinol, escalate dosage if appropriate for goal < 6.  8) Chronic lumbar pain secondary to DDD - follows at pain  management clinic.  - Continue home regimen.  9) DMII - AIC - 6.5 . Diet controlled. Wife reports that he has not been taking any medicines for few months but before that he was taking sulfonylureas.   -Continue SSI  - may consider starting metformin at discharge.  10) Dispo- pending until  further work up       LOS: 2 days   Inella Kuwahara 03/09/2012, 1:54 PM

## 2012-03-09 NOTE — Progress Notes (Signed)
Bilateral:  No evidence of hemodynamically significant internal carotid artery stenosis.   Vertebral artery flow is antegrade.     

## 2012-03-09 NOTE — Progress Notes (Signed)
Order received for Limited 2D echo with bubble study. Paged Elyse Jarvis, MD at [phone] (number given by Nolon Rod, RN) to clarify order (complete vs. Limited, contrast vs. Bubble study), but no return call. Order on hold until clarification received.  Emelia Loron, RDCS Echo Lab

## 2012-03-09 NOTE — Progress Notes (Addendum)
PT Cancellation Note  Evaluation cancelled this morning due to patient receiving procedure or test. Pt with a team of physicians. Will attempt to see later today.  Mike Floyd 03/09/2012, 11:41 AM  03/09/2012 Veda Canning, PT Pager: [phone]

## 2012-03-10 ENCOUNTER — Inpatient Hospital Stay (HOSPITAL_COMMUNITY): Payer: BC Managed Care – PPO

## 2012-03-10 DIAGNOSIS — I517 Cardiomegaly: Secondary | ICD-10-CM

## 2012-03-10 DIAGNOSIS — I10 Essential (primary) hypertension: Secondary | ICD-10-CM

## 2012-03-10 DIAGNOSIS — R4182 Altered mental status, unspecified: Secondary | ICD-10-CM

## 2012-03-10 DIAGNOSIS — K2960 Other gastritis without bleeding: Secondary | ICD-10-CM

## 2012-03-10 DIAGNOSIS — E119 Type 2 diabetes mellitus without complications: Secondary | ICD-10-CM

## 2012-03-10 LAB — CBC
HCT: 44 % (ref 39.0–52.0)
Hemoglobin: 16 g/dL (ref 13.0–17.0)
MCH: 32.7 pg (ref 26.0–34.0)
MCHC: 36.4 g/dL — ABNORMAL HIGH (ref 30.0–36.0)
MCV: 89.8 fL (ref 78.0–100.0)
RDW: 14.3 % (ref 11.5–15.5)
WBC: 6.8 10*3/uL (ref 4.0–10.5)

## 2012-03-10 LAB — BASIC METABOLIC PANEL
BUN: 9 mg/dL (ref 6–23)
CO2: 24 mEq/L (ref 19–32)
Chloride: 100 mEq/L (ref 96–112)
GFR calc Af Amer: 75 mL/min — ABNORMAL LOW (ref >90)
Potassium: 3.9 mEq/L (ref 3.5–5.1)

## 2012-03-10 LAB — GLUCOSE, CAPILLARY
Glucose-Capillary: 168 mg/dL — ABNORMAL HIGH (ref 70–99)
Glucose-Capillary: 230 mg/dL — ABNORMAL HIGH (ref 70–99)

## 2012-03-10 LAB — LIPID PANEL
HDL: 22 mg/dL — ABNORMAL LOW (ref >39)
LDL Cholesterol: UNDETERMINED mg/dL (ref 0–99)
Triglycerides: 491 mg/dL — ABNORMAL HIGH (ref <150)
VLDL: UNDETERMINED mg/dL (ref 0–40)

## 2012-03-10 MED ORDER — METFORMIN HCL 500 MG PO TABS: 500.0000 mg | ORAL_TABLET | Freq: Two times a day (BID) | ORAL | Status: DC

## 2012-03-10 MED ORDER — METFORMIN HCL 500 MG PO TABS
500.0000 mg | ORAL_TABLET | Freq: Two times a day (BID) | ORAL | Status: DC
  Filled 2012-03-10 (×2): qty 1

## 2012-03-10 MED ORDER — CLOPIDOGREL BISULFATE 75 MG PO TABS: 75.0000 mg | ORAL_TABLET | Freq: Every day | ORAL | Status: AC

## 2012-03-10 NOTE — Discharge Summary (Signed)
Internal Medicine Teaching [facility] Discharge Note  Name: Mike Floyd MRN: [id number] DOB: 1961-02-02 51 y.o.  Date of Admission: 03/07/2012  1:14 PM Date of Discharge: 03/11/2012 Attending Physician: No att. providers found  Discharge Diagnosis: Possible TIA/ stroke with left arm weakness( MRI could not be performed with ICD in place) Atypical chest pain, cardiac etiology was ruled out with negative CE and EKG.  Erosive esophagitis , last EGD on 01/13 Dysphagia s/p esophageal dilation in 01/13 DM with AIC of 6.5 HTN Gout Chronic lumbar pain     Discharge Medications: Medication List  As of 03/11/2012 10:51 AM   STOP taking these medications         aspirin 81 MG chewable tablet      oxyCODONE 15 MG immediate release tablet         TAKE these medications         allopurinol 300 MG tablet   Commonly known as: ZYLOPRIM   Take 300 mg by mouth daily.      carvedilol 25 MG tablet   Commonly known as: COREG   Take 25 mg by mouth 2 (two) times daily with a meal.      clopidogrel 75 MG tablet   Commonly known as: PLAVIX   Take 1 tablet (75 mg total) by mouth daily with breakfast.      digoxin 0.125 MG tablet   Commonly known as: LANOXIN   Take 125 mcg by mouth daily.      lisinopril 20 MG tablet   Commonly known as: PRINIVIL,ZESTRIL   Take 20 mg by mouth daily.      metFORMIN 500 MG tablet   Commonly known as: GLUCOPHAGE   Take 1 tablet (500 mg total) by mouth 2 (two) times daily with a meal.      oxyCODONE-acetaminophen 10-325 MG per tablet   Commonly known as: PERCOCET   Take 1 tablet by mouth every 6 (six) hours as needed. For pain      pantoprazole 40 MG tablet   Commonly known as: PROTONIX   Take 40 mg by mouth 2 (two) times daily.      sucralfate 1 G tablet   Commonly known as: CARAFATE   Take 1 g by mouth 4 (four) times daily.            Disposition and follow-up:   Mr.Lief L Stetzer was discharged from [facility] in  Stable condition.  At the hospital follow up visit please address : 1. He keeps up with his follow up appointment with Dr. Ladona Ridgel for interrogation of his ICD.   Follow-up Appointments: Follow-up Information    Follow up with Cassell Smiles., MD in 7 days. (Please follow up with Dr. Sherwood Gambler on Friday August 9th at 3:00PM)    Contact information:   [address] [place] [address] [phone]       Follow up with Lewayne Bunting, MD on 04/04/2012. (Please follow up with Dr. Ladona Ridgel on Tuesday August 27th at 3:45 PM)    Contact information:   [address] [place] [address] [phone]         Discharge Orders    Future Appointments: Provider: Department: Dept Phone: Center:   04/04/2012 3:45 PM Marinus Maw, MD [facility] [phone] [id number]     Future Orders Please Complete By Expires   Diet - low sodium heart healthy      Increase activity slowly  Call MD for:  temperature >100.4         Consultations: Treatment Team:  [facility], MD Kym Groom, MD  Procedures Performed:  Ct Head Wo Contrast  03/09/2012  *RADIOLOGY REPORT*  Clinical Data:  Left-sided weakness  CT HEAD WITHOUT CONTRAST  Technique:  Contiguous axial images were obtained from the base of the skull through the vertex without contrast  Comparison:  03/07/2012  Findings:  The brain has a normal appearance without evidence for hemorrhage, acute infarction, hydrocephalus, or mass lesion.  There is no extra axial fluid collection.  The skull and paranasal sinuses are normal.  IMPRESSION: Normal CT of the head without contrast.  Original Report Authenticated By: Judie Petit. Ruel Favors, M.D.   Ct Head Wo Contrast  03/07/2012  *RADIOLOGY REPORT*  Clinical Data: Upper extremity weakness.  Memory loss.  Confusion.  CT HEAD WITHOUT CONTRAST  Technique:  Contiguous axial images were obtained from the base of the skull through the  vertex without contrast.  Comparison: None  Findings: The study is performed following contrast enhanced exam of the chest.  There is residual contrast in the blood vessels.  There is no intra or extra-axial fluid collection or mass lesion. The basilar cisterns and ventricles have a normal appearance. There is no CT evidence for acute infarction or hemorrhage.  Bone windows show no calvarial fracture.  The visualized paranasal sinuses are well-aerated.  IMPRESSION: No evidence for acute  abnormality.  Original Report Authenticated By: Patterson Hammersmith, M.D.   Dg Chest Port 1 View  03/07/2012  *RADIOLOGY REPORT*  Clinical Data: Severe chest pain.  Shortness of breath.  Ex-smoker. Diabetes.  Hypertension.  PORTABLE CHEST - 1 VIEW  Comparison: 03/30/2011.  Findings: The heart remains normal in size.  The patient is currently rotated to the left.  The patient's chin is obscuring the medial right lung apex.  The visualized lungs are clear.  The interstitial markings are mildly prominent without Kerley lines. No pleural fluid.  Thoracic spine degenerative changes.  IMPRESSION: Mild chronic interstitial lung disease.  No acute abnormality.  Original Report Authenticated By: Darrol Angel, M.D.   Ct Angio Chest Aortic Dissect W &/or W/o  03/07/2012  *RADIOLOGY REPORT*  Clinical Data:  Left chest pain and shortness of breath.  Clinical concern for aortic dissection.  CT ANGIOGRAPHY CHEST WITHOUT AND WITH CONTRAST  Technique:  Multidetector CT imaging of the chest was performed using the standard protocol before and during bolus administration of intravenous contrast.  Multiplanar CT image reconstructions including MIPs were obtained to evaluate the vascular anatomy.  Contrast: 80mL OMNIPAQUE IOHEXOL 350 MG/ML SOLN  Comparison:  Portable chest obtained earlier today.  Findings:  Minimal atheromatous change in the thoracic aorta without aneurysm or dissection.  A left subclavian pacemaker lead is demonstrated with its  tip at the right ventricular apex.  The lungs are clear.  No lung masses or enlarged lymph nodes.  The pulmonary arteries are normally opacified with no pulmonary arterial filling defects seen.  Thoracic spine degenerative changes.  The upper abdomen is unremarkable.  Review of the MIP images confirms the above findings.  IMPRESSION: No aortic dissection, pulmonary embolism or other acute abnormality.  Original Report Authenticated By: Darrol Angel, M.D.    2D Echo with bubble study : Study Conclusions  - Left ventricle: The cavity size was normal. Wall thickness was normal. Systolic function was normal. The estimated ejection fraction was in the range of 50% to 55%. Wall motion  was normal; there were no regional wall motion abnormalities. - Aortic valve: Trivial regurgitation. - Mitral valve: Calcified annulus. - Left atrium: The atrium was mildly dilated. Impressions:  - Saline microcavitation study technically difficult but no obvious shunt.    Admission HPI: Pt is a 51 y.o., male with a PMHx of diet controlled DMII, HTN, nonischemic cardiomyopathy with last LV EF 55% (previously as low as 35%) and grade 1 diastolic dysfunction, history of WPW s/p AICD placement who presented to [facility] for evaluation of 3 day history of chest pain. Patient describes that since 03/04/2012, he has experienced intermittent episodes of left-sided pressure-like chest pain, with radiation to BL arms. No specific aggravating factors (such as rest, deep breathing, activity). He has used Alka-selzter for the pain, which initially improved the symptoms - however, has been ineffective today. As well, the patient used his nitroglycerin SL tablet, which have also been ineffective today. Patient describes that since the time of onset, the severity of chest pain symptoms have progressively worsened, with the pain peaking on the day of admission. A its worst, the pain was a 10/10, and currently rated a 4/10. During the pain  episodes, the patient noted worsened of BL arm does not experience any nausea, vomiting, diaphoresis, shortness of breath. Given the severity of pain symptoms, the patient had an unexplained episode of diffuse body shaking this morning, with some blurriness of vision, and slurred speak that was witnessed by his wife - and since resolved. No tongue biting, incontinence of urine or stool during this episode. The patient is not able to recall the events of this episode. Secondary to the severity of symptoms, the patient's family finally urged him to be evaluated at [facility]. He otherwise denies recent increased activity, direct trauma, falls, cough, congestion, fevers, chills, vomiting, nausea, long travel.  Of note, the patient has previously had multiple chest pain symptoms, prompting multiple cardiac workups. Last cardiac catheterization was in 10/2007, showing normal coronaries at that time. The patient states that the severity of current pain is worse than that previously experienced. The patient last AICD interrogation was approximately 8-12 months ago, and has not followed up since that time. Otherwise, the patient has multiple additional complaints that seem to be a chronically progressing over past several months, and undergoing evaluation as an outpatient. Specifically, noting DOE that has continued to progress, ongoing fatigue, left arm weakness.    Hospital Course by problem list:  1) Chest pain - Likely musculoskeletal in origin given that it was reproducible on palpation. Other possibility included pain related to his erosive esophagitis as patient was experiencing the return of symptoms lately. Cardiac etiology was ruled out with negative CE and serial negative EKG's . Cardiology was consulted and felt that his pain was less likely to be cardiac in nature given above factors. They recommended that he be seen by Dr. Ladona Ridgel in follow up for AICD interrogation later this month  The patient received at  chest CT which was negative.  Chest X ray showed no acute processes.  A TSH level was obtained and was within normal limits at 0.6.He was discharged home on protonix to help him with his symptoms of erosive esophagitis.  2) Acute on chronic kidney injury - On admission, the patient was noted to have mild AKI with admission serum creatinine of 1.4 that was pre -renal in etiology  with FeNa < 1.He was hydrated with fluids and his Cr dropped to 1.25 by discharge, which is his baseline.  3) Possible Stroke /TIA with LUE weakness/ slurred speech - patient had symptoms of LUE weakness on presentation that got worse on day 2 of hospitalization. The patient received two head CT's which showed no signs of any acute processes.  MRI was unable to be obtained secondary to AICD.   Given his history of DMII, smoking, HTN, low HLD a stroke or TIA could not be ruled out, so he was switched from ASA to plavix. Lipid panel was obtained which showed low levels of HDL and LDL (each 19). A repeat panel showed no change.  Carotid dopplers showed no signs of stenosis.  He also underwent 2D echo with bubble study that did not show the presence of any kind of shunt. The patient received serial neuro checks to monitor his status.  He developed left sided weakness the day after admission which gradually resolved.  Neurology was consulted and the patient received an EEG for ? History of shaking/tremors reported by the wife which was normal . Arrangements for home health PT/Ot were made at the time of discharge.  4) Nonischemic cardiomyopathy - Initially his lisinopril was held in setting of AKI, but was restarted on hospital day three.  He was also restarted on his home dose of digoxin 125 mcg.  He was continued on his home carvedilol 25 mg bid.  Daily weights and I/O's were monitored and the patient had no issues related to this problem.  5) Erosive esophagitis - His last EGD 08/2011 showed unhealed erosive esophagitis, and the  patient had an esophageal dilatation at that time as well, with improved dysphagia to solids and liquids. He was maintained on his home pantoprazole 40 mg PO and sucralfate.     6) HTN - This problem was well controlled and he was continued on his home carvedilol and lisinopril.  7) Gout - The patient showed no signs of any acute attacks.  His uric acid level was 5.5. He was continued on his home allopurinol.  8) Chronic lumbar pain secondary to DDD -  The patient was continued on his home pain regimen consisting of Percocet PRN.  9) DMII - His A1C on admission was 6.5 indicating appropriate control.  Of note, the patient had not taken his home diabetes medication in over two months due to cost issues.  He was started on metformin at the time of discharge.      Discharge Vitals:  BP 118/79  Pulse 80  Temp 98 F (36.7 C) (Oral)  Resp 20  Ht 6\' 2"  (1.88 m)  Wt 229 lb 11.5 oz (104.2 kg)  BMI 29.49 kg/m2  SpO2 98%  Discharge Labs:  Results for orders placed during the hospital encounter of 03/07/12 (from the past 24 hour(s))  GLUCOSE, CAPILLARY     Status: Abnormal   Collection Time   03/10/12 11:16 AM      Component Value Range   Glucose-Capillary 230 (*) 70 - 99 mg/dL   Comment 1 Notify RN      Signed: Kaslyn Richburg 03/11/2012, 10:51 AM   Time Spent on Discharge: > 30 minutes

## 2012-03-10 NOTE — Progress Notes (Signed)
Dr. Kem Kays returned paged; RN told to call Resident; Resident paged; awaiting callback.

## 2012-03-10 NOTE — Progress Notes (Signed)
Routine EEG completed.  

## 2012-03-10 NOTE — Progress Notes (Signed)
  Echocardiogram 2D Echocardiogram with bubble study has been performed.  Mike Floyd FRANCES 03/10/2012, 3:32 PM

## 2012-03-10 NOTE — Progress Notes (Signed)
Order clarified; pt to have complete 2D echo with bubble study; pt pending d/c home after all tests have been resulted; pt and wife made aware; will cont. To monitor.

## 2012-03-10 NOTE — Evaluation (Signed)
Physical Therapy Evaluation Patient Details Name: Mike Floyd MRN: [id number] DOB: 11/14/1960 Today's Date: 03/10/2012 Time: 0940-1001 PT Time Calculation (min): 21 min  PT Assessment / Plan / Recommendation Clinical Impression  Pt adm with lt sided weakness of questionable etiology.  Pt with good home support from wife.  Recommend return home with wife and HHPT and HHOT.    PT Assessment  Patient needs continued PT services    Follow Up Recommendations  Home health PT    Barriers to Discharge        Equipment Recommendations   (wife has access to rolling walker for pt)    Recommendations for Other Services OT consult   Frequency Min 3X/week    Precautions / Restrictions Precautions Precautions: Fall   Pertinent Vitals/Pain N/A      Mobility  Bed Mobility Bed Mobility: Supine to Sit;Sitting - Scoot to Edge of Bed Supine to Sit: 6: Modified independent (Device/Increase time) Sitting - Scoot to Edge of Bed: 6: Modified independent (Device/Increase time) Details for Bed Mobility Assistance: incr time Transfers Transfers: Sit to Stand;Stand to Sit Sit to Stand: From bed;From chair/3-in-1;With armrests;With upper extremity assist;5: Supervision Stand to Sit: 5: Supervision;With upper extremity assist;To chair/3-in-1;With armrests Details for Transfer Assistance: Incr time Ambulation/Gait Ambulation/Gait Assistance: 5: Supervision;4: Min assist Ambulation Distance (Feet): 125 Feet Assistive device: None;Rolling walker;1 person hand held assist Ambulation/Gait Assistance Details: In small enviroment (room) pt required only supervision and no assistive device.  In hallway required either rolling walker or HHA. Verbal cues to look ahead. Gait Pattern: Decreased step length - left;Left flexed knee in stance Stairs: Yes Stairs Assistance: 4: Min guard Stair Management Technique: One rail Right;Forwards;Step to pattern Number of Stairs: 1     Exercises     PT Diagnosis:  Difficulty walking;Hemiplegia non-dominant side  PT Problem List: Decreased strength;Decreased mobility;Decreased balance PT Treatment Interventions: DME instruction;Gait training;Functional mobility training;Therapeutic activities;Therapeutic exercise;Balance training;Patient/family education   PT Goals Acute Rehab PT Goals PT Goal Formulation: With patient Time For Goal Achievement: 03/17/12 Potential to Achieve Goals: Good Pt will go Sit to Stand: with modified independence PT Goal: Sit to Stand - Progress: Goal set today Pt will go Stand to Sit: with modified independence PT Goal: Stand to Sit - Progress: Goal set today Pt will Ambulate: 51 - 150 feet;with modified independence PT Goal: Ambulate - Progress: Goal set today  Visit Information  Last PT Received On: 03/10/12 Assistance Needed: +1    Subjective Data  Subjective: "Thank you for everything." Patient Stated Goal: Go home   Prior Functioning  Home Living Lives With: Spouse Available Help at Discharge: Family;Available 24 hours/day Type of Home: Mobile home Home Access: Stairs to enter Entrance Stairs-Number of Steps: 4 Entrance Stairs-Rails: Right Home Layout: One level Bathroom Shower/Tub: Walk-in shower;Tub/shower unit Bathroom Toilet: Standard Additional Comments: Wife has access to rolling walker Prior Function Level of Independence: Independent Able to Take Stairs?: Yes Driving: Yes Vocation: On disability Communication Communication: No difficulties Dominant Hand: Right    Cognition  Overall Cognitive Status: Appears within functional limits for tasks assessed/performed Arousal/Alertness: Awake/alert Orientation Level: Appears intact for tasks assessed Behavior During Session: [facility] for tasks performed    Extremity/Trunk Assessment Right Upper Extremity Assessment RUE ROM/Strength/Tone: Within functional levels Left Upper Extremity Assessment LUE ROM/Strength/Tone: Deficits LUE ROM/Strength/Tone  Deficits: Shoulder 3-/5, elbow 3-/5 Right Lower Extremity Assessment RLE ROM/Strength/Tone: Within functional levels Left Lower Extremity Assessment LLE ROM/Strength/Tone: Deficits LLE ROM/Strength/Tone Deficits: hip, knee, ankle grossly 4/5  Balance Static Sitting Balance Static Sitting - Balance Support: No upper extremity supported Static Sitting - Level of Assistance: 5: Stand by assistance  End of Session PT - End of Session Equipment Utilized During Treatment: Gait belt Activity Tolerance: Patient tolerated treatment well Patient left: in chair;with call bell/phone within reach Nurse Communication: Mobility status  GP     Adaya Garmany 03/10/2012, 10:10 AM  [employer] PT [phone]

## 2012-03-10 NOTE — Progress Notes (Addendum)
INTERNAL MEDICINE TEACHING SERVICE Attending Note  Date: 03/10/2012  Patient name: Mike Floyd  Medical record number: [id number]  Date of birth: 02-03-1961    This patient has been seen and discussed with the house staff. Please see their note for complete details. I concur with their findings with the following additions/corrections: Feels well this morning. States that LUE/LLE strength is "more or less the same" as yesterday. Denies difficulty with speech. Denies SOB, CP.  A/P: Patient is a 51 y.o., male with a PMHX of Type 2 DM, HTN, NICM EF 55%, WPW s/p AICD placement, GERD,  presents with CP and LUE/LLE weakness and possible seizure activity.  1. LUE/LLE weakness, possible CVA: He is still noted to have 3/5 strength on LUE/LLE, suspicious for CVA. Doppler U/S carotids negative for significant stenosis. Agree with plavix change. Appreciate Neuro consult. Will need PT/OT. Pending Echo with bubble study to rule out PFO. EEG may be done as outpatient. He will need Neuro follow up on discharge. 2. CP: Resolved. NO evidence of ACS. 3. Hypertriglyceridemia: Will need treatment with fibrate as outpatient and to monitor Lipid panel, LDL not able to be calculated at this time. 4. Thrombocytopenia: No evidence of bleeding. Will need PCP F/U with repeat CBC in the next week. 5. If Echo is negative, may D/C home with appropriate follow up. 6. PT/OT as outpatient. 7. Type 2 DM: Will need re-eval as outpatient to consider starting treatment. Jonah Blue, DO  03/10/2012, 12:12 PM

## 2012-03-10 NOTE — Progress Notes (Signed)
[facility] PROGRESS NOTE    SUBJECTIVE   Feels slightly stronger today.  Looking forward to PT.    OBJECTIVE   Vital signs in last 24 hours: Temp:  [97.9 F (36.6 C)-99.1 F (37.3 C)] 97.9 F (36.6 C) (08/02 0538) Pulse Rate:  [75-89] 75  (08/02 0538) Resp:  [18-20] 18  (08/02 0538) BP: (107-123)/(71-81) 107/71 mmHg (08/02 0538) SpO2:  [96 %-100 %] 96 % (08/02 0538) Weight:  [104.2 kg (229 lb 11.5 oz)] 104.2 kg (229 lb 11.5 oz) (08/02 0538)  Intake/Output from previous day: 08/01 0701 - 08/02 0700 In: 960 [P.O.:960] Out: 300 [Urine:300] Intake/Output this shift:   Nutritional status: Cardiac  Past Medical History  Diagnosis Date  . Diabetes mellitus DX: 2010  . Gout   . Hypertension   . CKD (chronic kidney disease), stage II   . Erosive esophagitis     a. per EGD (08/2011), Dr. Karilyn Cota - Erosive reflux esophagitis improved but not completely healed since previous EGD 3 years ago. Bx showing  ulcerated gatroesophageal junction mucosa. negative for H. pylori  . Nonischemic dilated cardiomyopathy     a. H/O EF as low as 35-40% by LV gram 10/2007;  b. Echo 02/2011 EF 50-55%, inf HK, Gr 1 DD   . DDD (degenerative disc disease), lumbar   . Hearing deficit     a. wear bilateral hearing aides  . AICD (automatic cardioverter/defibrillator) present 11/2007    a. 11/2007 [facility] VR - single lead ICD  . Chest pain     a. 10/2007 Cath:  normal Cors.  . WPW (Wolff-Parkinson-White syndrome)     a. s/p RFCA @ [facility] - 1999    Neurologic Exam:  Mental Status: Alert, oriented X 3.  Speech fluent without evidence of aphasia. Able to follow 3 step commands without difficulty. Mood:upbeat Memory:intact Thought content appropriate Cranial Nerves: II-Visual fields grossly intact. III/IV/VI-Extraocular movements intact.  Pupils reactive bilaterally. Ptosis not present. V/VII-Smile symmetric VIII-grossly intact IX/X-normal  gag XI-bilateral shoulder shrug XII-midline tongue extension Motor: 5/5 bilaterally with normal tone and bulk, continues to show slow movements when raising his left arm but has full 5/5 strength against resistance.  Sensory: Pinprick and light touch intact throughout, bilaterally Deep Tendon Reflexes: 2+ and symmetric throughout.     Plantars:      Right:  downgoing     Left:  Downgoing Cerebellar: Normal finger-to-nose and normal heel-to-shin test.     Lab Results: Lab Results  Component Value Date/Time   CHOL 139 03/10/2012  5:05 AM   Lipid Panel  Basename 03/10/12 0505  CHOL 139  TRIG 491*  HDL 22*  CHOLHDL 6.3  VLDL UNABLE TO CALCULATE IF TRIGLYCERIDE OVER 400 mg/dL  LDLCALC UNABLE TO CALCULATE IF TRIGLYCERIDE OVER 400 mg/dL    Studies/Results: Ct Head Wo Contrast  03/09/2012  *RADIOLOGY REPORT*  Clinical Data:  Left-sided weakness  CT HEAD WITHOUT CONTRAST  Technique:  Contiguous axial images were obtained from the base of the skull through the vertex without contrast  Comparison:  03/07/2012  Findings:  The brain has a normal appearance without evidence for hemorrhage, acute infarction, hydrocephalus, or mass lesion.  There is no extra axial fluid collection.  The skull and paranasal sinuses are normal.  IMPRESSION: Normal  CT of the head without contrast.  Original Report Authenticated By: Judie Petit. Ruel Favors, M.D.    Medications:     Scheduled:   . allopurinol  300 mg Oral Daily  . carvedilol  25 mg Oral BID WC  . clopidogrel  75 mg Oral Q breakfast  . digoxin  125 mcg Oral Daily  . heparin  5,000 Units Subcutaneous Q8H  . insulin aspart  0-9 Units Subcutaneous TID WC  . lisinopril  20 mg Oral Daily  . pantoprazole  40 mg Oral Q1200  . sodium chloride  3 mL Intravenous Q12H  . sucralfate  1 g Oral QID  . DISCONTD: aspirin  81 mg Oral Daily   Carotid doppler: No evidence of hemodynamically significant internal carotid artery stenosis. Vertebral artery flow is  antegrade.   Assessment/Plan:   51 YO male with Left arm weakness after admission for CP and negative cardiac workup.  Patient did have episode of bilateral shaking prior to arrival in ED in which he was stated to be "repeating" but not having conversant speech during episode. Patient developed left sided arm and legs weakness. This is not necessarily c/w a todd's paralysis but can not rule out the possibility of a TIA/infarct. CT head x2 has been negative for intracranial abnormalities.  Carotid dopplers show no stenosis.  EEG and Echo pending. Patient has been changed to Plavix daily.   Recommend: 1) PT/OT 2) Awaiting results of Echo and EEG. If echo is normal and patient has a follow up appointment made with PCP prior to discharge, reading of EEG should not hold up discharge.         Mike Morn PA-C [facility] [phone]  03/10/2012, 9:20 AM

## 2012-03-10 NOTE — Procedures (Signed)
EEG NUMBER:  REFERRING PHYSICIAN:  Dr. Katrinka Blazing.  HISTORY:  A 51 year old male with the left-sided weakness.  MEDICATIONS:  Zyloprim, Coreg, Plavix, Lanoxin, heparin, NovoLog, Protonix, Carafate, aspirin.  CONDITIONS OF RECORDING:  This is a 16 channel EEG carried out with the patient in the awake, drowsy, and asleep states.  DESCRIPTION:  During wakefulness, the posterior background rhythm is a very low-voltage and poorly sustained.  Reproducible rhythm cannot be obtained.  The central and temporal regions are dominated by alpha and theta rhythms.  Faster rhythms are noted anteriorly.  The patient drowses throughout the majority of the tracing with slowing of the background rhythm.  The patient goes into a light sleep with symmetrical sleep spindles, vertex was a sharp activity and irregular slow activity. Hypoventilation was not performed and thus hyperventilation was not performed.  Intermittent photic stimulation failed to elicit any change in the tracing.  IMPRESSION:  This is a normal EEG.  No epileptiform activity was noted.          ______________________________ Thana Farr, MD    ZO:[id number] D:  03/10/2012 12:55:22  T:  03/10/2012 21:05:21  Job #:  [id number]

## 2012-03-10 NOTE — Progress Notes (Signed)
Called echo lab to inquire about when 2D echo would be done; tech reported clarification was needed prior to being able to do exam; Dr. Kem Kays paged at this time; will await callback.

## 2012-03-10 NOTE — Progress Notes (Signed)
Medical Student Daily Progress Note  Subjective: Mr. Neisler states that he is doing well this morning.  He feels that his left arm is continuing to gain strength and that overall he is back to his baseline.  He continues to eat well and denies any other problems at this time. Objective: Vital signs in last 24 hours: Filed Vitals:   03/09/12 1435 03/09/12 1948 03/10/12 0538 03/10/12 1010  BP: 123/81 121/77 107/71 118/73  Pulse: 89 88 75 84  Temp: 98 F (36.7 C) 99.1 F (37.3 C) 97.9 F (36.6 C)   TempSrc: Oral Oral Oral   Resp: 18 20 18    Height:      Weight:   104.2 kg (229 lb 11.5 oz)   SpO2: 96% 100% 96%    Weight change: -0.2 kg (-7.1 oz)  Intake/Output Summary (Last 24 hours) at 03/10/12 1053 Last data filed at 03/09/12 1700  Gross per 24 hour  Intake    600 ml  Output      0 ml  Net    600 ml   Physical Exam: General appearance: alert and cooperative Eyes: conjunctivae/corneas clear. PERRL, EOM's intact. Fundi benign. Lungs: clear to auscultation bilaterally Chest wall: mild tenderness to palpation over xyphoid process Heart: regular rate and rhythm, S1, S2 normal, no murmur, click, rub or gallop Abdomen: soft, non-tender; bowel sounds normal; no masses,  no organomegaly Pulses: 1+ pedal pulses Neurologic: Sensory: normal Motor: 4+/5 strength in upper left extremity, 5/5 in all other extremities Lab Results: BMET    Component Value Date/Time   NA 139 03/10/2012 0505   K 3.9 03/10/2012 0505   CL 100 03/10/2012 0505   CO2 24 03/10/2012 0505   GLUCOSE 159* 03/10/2012 0505   BUN 9 03/10/2012 0505   CREATININE 1.25 03/10/2012 0505   CALCIUM 9.5 03/10/2012 0505   GFRNONAA 65* 03/10/2012 0505   GFRAA 75* 03/10/2012 0505    CBC    Component Value Date/Time   WBC 6.8 03/10/2012 0505   RBC 4.90 03/10/2012 0505   HGB 16.0 03/10/2012 0505   HCT 44.0 03/10/2012 0505   PLT 107* 03/10/2012 0505   MCV 89.8 03/10/2012 0505   MCH 32.7 03/10/2012 0505   MCHC 36.4* 03/10/2012 0505   RDW 14.3 03/10/2012  0505   LYMPHSABS 1.2 03/07/2012 1332   MONOABS 0.5 03/07/2012 1332   EOSABS 0.1 03/07/2012 1332   BASOSABS 0.1 03/07/2012 1332    Micro Results: No results found for this or any previous visit (from the past 240 hour(s)). Studies/Results: Ct Head Wo Contrast  03/09/2012  *RADIOLOGY REPORT*  Clinical Data:  Left-sided weakness  CT HEAD WITHOUT CONTRAST  Technique:  Contiguous axial images were obtained from the base of the skull through the vertex without contrast  Comparison:  03/07/2012  Findings:  The brain has a normal appearance without evidence for hemorrhage, acute infarction, hydrocephalus, or mass lesion.  There is no extra axial fluid collection.  The skull and paranasal sinuses are normal.  IMPRESSION: Normal CT of the head without contrast.  Original Report Authenticated By: Judie Petit. Ruel Favors, M.D.   Medications: I have reviewed the patient's current medications. Scheduled Meds:   . allopurinol  300 mg Oral Daily  . carvedilol  25 mg Oral BID WC  . clopidogrel  75 mg Oral Q breakfast  . digoxin  125 mcg Oral Daily  . heparin  5,000 Units Subcutaneous Q8H  . insulin aspart  0-9 Units Subcutaneous TID WC  .  lisinopril  20 mg Oral Daily  . pantoprazole  40 mg Oral Q1200  . sodium chloride  3 mL Intravenous Q12H  . sucralfate  1 g Oral QID  . DISCONTD: aspirin  81 mg Oral Daily   Continuous Infusions:  PRN Meds:.morphine injection, oxyCODONE, oxyCODONE-acetaminophen Assessment/Plan:  Patient is a 51 year old male with a PMHx of diet controlled DMII, HTN, nonischemic cardiomyopathy with last LV EF 55% (previously as low as 35%) and grade 1 diastolic dysfunction, history of WPW s/p AICD placement), erosive esophagitis, who is admitted to [facility] on 03/07/2012 with 2-3 day history of chest pain.  1) Chest pain - At this time, the patient feels this has returned to his baseline.  A musculoskeletal problem is the most likely cause, given that the pain is reproducible with palpation.   His  chest pain is less likely cardiac in nature given negative cardiac enzymes x3, normal EKG, reproducible pain with palpation, normal cardiac cath in 2009, and lack of response to nitroglycerin. His TSH level is wnl at 0.6. Other differential diagnoses include pain secondary to pulmonary hypertension in setting of uncontrolled OSA (does not tolerate CPAP) - although no clear evidence of resultant right heart failure on exam).  As well, he has history of erosive esophagitis (although would not explain his constellation of arm weakness, etc).  Other life-threatening differentials including aortic dissection, aneurysm, PE have been ruled out by CTA Chest.  Plan:  - Continue on telemetry unit  - Per neurology recs, changed ASA to clopidogrel 75 mg in setting of possible GI ulceration - Continue beta blocker, morphine PRN  - Continue PPI for esophagitis.   2) Acute on chronic kidney injury -  Resolved. The patient states he has baseline renal insufficiency, although he cannot elaborate. He has shown no signs of orthostatic hypotension. His creatinine is stable at 1.25.  3) UE weakness/ slurred speech - unclear etiology, had resolved by time of evaluation. Again, it is unclear if this is associated with the chest pain.  Initial concern for stroke, however, repeat head CT shows no acute processes. Carotid dopplers show no signs of stenosis.  Cannot get an MRI secondary to AICD - as well, his symptoms are resolved at the time of evaluation. His strength continues to improve and his sensation has returned to baseline.  Neurology consult recommended EEG for seizure evaluation and following up on CTE with bubble study.  PT/OT worked with the patient this morning.  Repeat lipid panel shows HDL of 22 and LDL too low to calculate. Plan:  - Continue to perform neuro checks.  - Neurology following, appreciate assistance  - Will follow up on CTE bubble study - Will follow up on EEG  4) Nonischemic cardiomyopathy - last  echo in 03/2011 showing LV EF 50-55% with hypokinesis of the inferior myocardium and grade 1 diastolic dysfunction. Prior Echo in 02/2008 showing systolic function that is moderately depressed. Patient is s/p AICD placement in 11/2007 for cardiac arrhythmias and EF < 30%. Specifically, [employer] CURRENT at [place] VR RF single chamber cardioverter defibrillator with defibrillator threshold study less than or equal to 25 joules. Has not had interrogation in 8-12 months per patient.  Plan:  - Continue lisinopril given lowering creatinine.  - Continue digoxin  - Continue beta blocker.  - Daily weight, strict I/O  5) Erosive esophagitis - last EGD 08/2011 showing unhealed erosive esophagitis, patient had an esophageal dilatation at that time as well, with improved dysphagia to solids and  liquids. However, he states that his trouble swallowing his been increasing over the past few months, indicating a possible narrowing of his esophagus.  He has not had any difficulty with meals during this hospitalization.  Plan:  - Continue PO PPI.  - Continue sucralfate.  - Consider follow up with GI to evaluate possible re-narrowing of esophagus and potential ulcer as a cause of recurrent chest pain (patient's insurance runs out today, will hopefully have disability within the next few months.)  6) HTN - well controlled, stable at present.  Plan:  - Continue carvedilol and lisinopril at home dose.  7) Gout - on chronic allopurinol, no documentation of crystal testing. No indication of acute attack. Uric acid level is 5.5.  Plan:  - Continue allopurinol, escalate dosage if appropriate for goal < 6.  8) Chronic lumbar pain secondary to DDD - follows at pain management clinic.  Plan:  - Continue home regimen.  9) DMII - unknown baseline control. Patient has not had his diabetes medication (unknown) filled in over two months due to cost issues. His A1C on admission was 6.5 indicating appropriate control.  Plan:  -  SSI-S - Consider discharge with metformin   LOS: 3 days   This is a Psychologist, occupational Note.  The care of the patient was discussed with Dr. Dorthula Rue and the assessment and plan formulated with their assistance.  Please see their attached note for official documentation of the daily encounter.  Wende Bushy 03/10/2012, 10:53 AM  Resident Co-sign Daily Note: I have seen the patient and reviewed the daily progress note by Gerald Stabs MS IV and discussed the care of the patient with them.  See below for documentation of my findings, assessment, and plans.  Subjective: He reports feeling much better- he reports improved sensations on the left side. He is waiting to go home after his echo. Objective: Vital signs in last 24 hours: Filed Vitals:   03/09/12 1435 03/09/12 1948 03/10/12 0538 03/10/12 1010  BP: 123/81 121/77 107/71 118/73  Pulse: 89 88 75 84  Temp: 98 F (36.7 C) 99.1 F (37.3 C) 97.9 F (36.6 C)   TempSrc: Oral Oral Oral   Resp: 18 20 18    Height:      Weight:   229 lb 11.5 oz (104.2 kg)   SpO2: 96% 100% 96%    Physical Exam: BP 118/73  Pulse 84  Temp 97.9 F (36.6 C) (Oral)  Resp 18  Ht 6\' 2"  (1.88 m)  Wt 229 lb 11.5 oz (104.2 kg)  BMI 29.49 kg/m2  SpO2 96%  General Appearance:    Alert, cooperative, no distress, appears stated age  Head:    Normocephalic, without obvious abnormality, atraumatic  Eyes:    PERRL, conjunctiva/corneas clear, EOM's intact, fundi    benign, both eyes       Ears:    Normal TM's and external ear canals, both ears  Nose:   Nares normal, septum midline, mucosa normal, no drainage    or sinus tenderness  Throat:   Lips, mucosa, and tongue normal; teeth and gums normal  Neck:   Supple, symmetrical, trachea midline, no adenopathy;       thyroid:  No enlargement/tenderness/nodules; no carotid   bruit or JVD  Back:     Symmetric, no curvature, ROM normal, no CVA tenderness  Lungs:     Clear to auscultation bilaterally, respirations  unlabored  Chest wall:    No tenderness or deformity  Heart:  Regular rate and rhythm, S1 and S2 normal, no murmur, rub   or gallop  Abdomen:     Soft, non-tender, bowel sounds active all four quadrants,    no masses, no organomegaly  Genitalia:    Normal male without lesion, discharge or tenderness  Rectal:    Normal tone, normal prostate, no masses or tenderness;   guaiac negative stool  Extremities:   Extremities normal, atraumatic, no cyanosis or edema  Pulses:   2+ and symmetric all extremities  Skin:   Skin color, texture, turgor normal, no rashes or lesions  Lymph nodes:   Cervical, supraclavicular, and axillary nodes normal  Neurologic:   LUE -4+/5 , LLE - 5/5 , sensations intact to light touch bilaterally   Lab Results: Reviewed and documented in Electronic Record Micro Results: Reviewed and documented in Electronic Record Studies/Results: Reviewed and documented in Electronic Record Medications: I have reviewed the patient's current medications. Scheduled Meds:   . allopurinol  300 mg Oral Daily  . carvedilol  25 mg Oral BID WC  . clopidogrel  75 mg Oral Q breakfast  . digoxin  125 mcg Oral Daily  . heparin  5,000 Units Subcutaneous Q8H  . insulin aspart  0-9 Units Subcutaneous TID WC  . lisinopril  20 mg Oral Daily  . pantoprazole  40 mg Oral Q1200  . sodium chloride  3 mL Intravenous Q12H  . sucralfate  1 g Oral QID  . DISCONTD: aspirin  81 mg Oral Daily   Continuous Infusions:  PRN Meds:.morphine injection, oxyCODONE, oxyCODONE-acetaminophen Assessment/Plan: 1) Chest pain - Resolved. Plan:  - Aspirin, beta blocker, morphine PRN  - Continue PPI and sucralfate for esophagitis.   2) Acute on chronic kidney injury -  Resolved  - Continue to monitor  - BMET in AM   3) Left sided weakness and numbness / possible stroke  - Patient reports improved sensations. On exam, he continues to have weakness but is improving. CT head was WNL. His carotid dopplers were  negative for any stenosis. 2D echo is pending to rule out PFO. Appreciate neuro inputs! - Continue plavix, statin . - Arrange for home health PT/OT.   4) Nonischemic cardiomyopathy - last echo in 03/2011 showing LV EF 50-55% with hypokinesis of the inferior myocardium and grade 1 diastolic dysfunction. Prior Echo in 02/2008 showing systolic function that is moderately depressed. Patient is s/p AICD placement in 11/2007 for cardiac arrhythmias and EF < 30%. Specifically, [employer] Jude CURRENT at [place] VR RF single chamber cardioverter defibrillator with  defibrillator threshold study less than or equal to 25 joules. Has not had interrogation in 8-12 months per patient.  - Restart lisinopril and digoxin  - Continue beta blocker.  - Daily weight, strict I/O   5) Erosive esophagitis - last EGD 08/2011 showing unhealed erosive esophagitis, patient had an esophageal dilatation at that time as well, with improved dysphagia to solids and liquids.  - Continue protonix and sucralfate.   6) HTN - well controlled, stable at present.  - Continue metoprolol and lisinopril at home dose.   7) Gout - on chronic allopurinol, no documentation of crystal testing( uric acid -5.5). No indication of acute attack.  - Continue allopurinol, escalate dosage if appropriate for goal < 6.   8) Chronic lumbar pain secondary to DDD - follows at pain management clinic.  - Continue home regimen.   9) DMII - AIC - 6.5 . Diet controlled. Wife reports that he has not been taking any  medicines for few months but before that he was taking sulfonylureas.  -Continue SSI  - Start him on metformin    10) Dispo- d/c home today after 2 d echo   LOS: 3 days   Tamila Gaulin 03/10/2012, 2:34 PM

## 2012-03-13 NOTE — Discharge Summary (Signed)
INTERNAL MEDICINE TEACHING SERVICE Attending Note  Date: 03/13/2012  Patient name: Mike Floyd  Medical record number: [id number]  Date of birth: Dec 31, 1960    This patient has been discussed with the house staff. Please see their note for complete details. I concur with their findings, plan, and follow up plans.  Jonah Blue, DO  03/13/2012, 2:06 PM

## 2012-03-27 ENCOUNTER — Emergency Department (HOSPITAL_COMMUNITY): Payer: BC Managed Care – PPO

## 2012-03-27 ENCOUNTER — Emergency Department (HOSPITAL_COMMUNITY)
Admission: EM | Admit: 2012-03-27 | Discharge: 2012-03-27 | Disposition: A | Discharge status: Home / Self Care | Payer: BC Managed Care – PPO | Attending: Emergency Medicine | Admitting: Emergency Medicine

## 2012-03-27 DIAGNOSIS — R112 Nausea with vomiting, unspecified: Secondary | ICD-10-CM

## 2012-03-27 DIAGNOSIS — E119 Type 2 diabetes mellitus without complications: Secondary | ICD-10-CM | POA: Insufficient documentation

## 2012-03-27 DIAGNOSIS — M109 Gout, unspecified: Secondary | ICD-10-CM | POA: Insufficient documentation

## 2012-03-27 DIAGNOSIS — Z95810 Presence of automatic (implantable) cardiac defibrillator: Secondary | ICD-10-CM | POA: Insufficient documentation

## 2012-03-27 DIAGNOSIS — Z87891 Personal history of nicotine dependence: Secondary | ICD-10-CM | POA: Insufficient documentation

## 2012-03-27 DIAGNOSIS — N182 Chronic kidney disease, stage 2 (mild): Secondary | ICD-10-CM | POA: Insufficient documentation

## 2012-03-27 DIAGNOSIS — K920 Hematemesis: Secondary | ICD-10-CM | POA: Insufficient documentation

## 2012-03-27 DIAGNOSIS — I129 Hypertensive chronic kidney disease with stage 1 through stage 4 chronic kidney disease, or unspecified chronic kidney disease: Secondary | ICD-10-CM | POA: Insufficient documentation

## 2012-03-27 DIAGNOSIS — R066 Hiccough: Secondary | ICD-10-CM

## 2012-03-27 LAB — CBC
HCT: 45.4 % (ref 39.0–52.0)
Hemoglobin: 16.8 g/dL (ref 13.0–17.0)
MCH: 32.9 pg (ref 26.0–34.0)
MCHC: 37 g/dL — ABNORMAL HIGH (ref 30.0–36.0)
MCV: 88.8 fL (ref 78.0–100.0)

## 2012-03-27 LAB — BASIC METABOLIC PANEL
BUN: 23 mg/dL (ref 6–23)
Calcium: 10.2 mg/dL (ref 8.4–10.5)
GFR calc non Af Amer: 54 mL/min — ABNORMAL LOW (ref >90)
Glucose, Bld: 132 mg/dL — ABNORMAL HIGH (ref 70–99)

## 2012-03-27 LAB — POCT I-STAT, CHEM 8
Chloride: 99 mEq/L (ref 96–112)
Creatinine, Ser: 1.4 mg/dL — ABNORMAL HIGH (ref 0.50–1.35)
Glucose, Bld: 144 mg/dL — ABNORMAL HIGH (ref 70–99)
HCT: 46 % (ref 39.0–52.0)
Potassium: 4.2 mEq/L (ref 3.5–5.1)
Sodium: 136 mEq/L (ref 135–145)

## 2012-03-27 MED ORDER — SODIUM CHLORIDE 0.9 % IV BOLUS (SEPSIS)
1000.0000 mL | Freq: Once | INTRAVENOUS | Status: AC
  Administered 2012-03-27: 1000 mL via INTRAVENOUS

## 2012-03-27 MED ORDER — ONDANSETRON HCL 4 MG/2ML IJ SOLN
4.0000 mg | Freq: Once | INTRAMUSCULAR | Status: AC
  Administered 2012-03-27: 4 mg via INTRAVENOUS
  Filled 2012-03-27: qty 2

## 2012-03-27 MED ORDER — PANTOPRAZOLE SODIUM 40 MG IV SOLR
40.0000 mg | Freq: Once | INTRAVENOUS | Status: AC
  Administered 2012-03-27: 40 mg via INTRAVENOUS
  Filled 2012-03-27: qty 40

## 2012-03-27 MED ORDER — SODIUM CHLORIDE 0.9 % IV SOLN
INTRAVENOUS | Status: DC
  Administered 2012-03-27: 03:00:00 via INTRAVENOUS

## 2012-03-27 MED ORDER — PROMETHAZINE HCL 25 MG/ML IJ SOLN: 25.0000 mg | Freq: Once | INTRAMUSCULAR | Status: DC

## 2012-03-27 MED ORDER — METOCLOPRAMIDE HCL 5 MG/ML IJ SOLN
10.0000 mg | Freq: Once | INTRAMUSCULAR | Status: AC
  Administered 2012-03-27: 10 mg via INTRAVENOUS
  Filled 2012-03-27: qty 2

## 2012-03-27 MED ORDER — METOCLOPRAMIDE HCL 10 MG PO TABS: 10.0000 mg | ORAL_TABLET | Freq: Four times a day (QID) | ORAL | Status: DC

## 2012-03-27 NOTE — ED Provider Notes (Signed)
History     CSN: [id number]  Arrival date & time 03/27/12  0057   First MD Initiated Contact with Patient 03/27/12 0117      Chief Complaint  Patient presents with  . Hematemesis    (Consider location/radiation/quality/duration/timing/severity/associated sxs/prior treatment) HPI HX per PT. Started on plavix about 2 weeks ago when diagnosed with TIA.  Last few days has been having recurrent uncontrolled hiccups to the point of vomiting.  He has had multiple episodes of emesis last days and tonight had an episode of coffee ground emesis, became concerned it may be blood and presents here for evaluation. No h/o PUD or GIB - does have h/o erosive esophagitis followed by Dr Karilyn Cota with EGD 08/2011. Some epigastric discomfort. No F/C, no blood in stool, no black or tarry stool. PT have sig concern about hiccups. No known alleviating factors, has had very poor sleep last 48 hours with these symptoms.  Past Medical History  Diagnosis Date  . Diabetes mellitus DX: 2010  . Gout   . Hypertension   . CKD (chronic kidney disease), stage II   . Erosive esophagitis     a. per EGD (08/2011), Dr. Karilyn Cota - Erosive reflux esophagitis improved but not completely healed since previous EGD 3 years ago. Bx showing  ulcerated gatroesophageal junction mucosa. negative for H. pylori  . Nonischemic dilated cardiomyopathy     a. H/O EF as low as 35-40% by LV gram 10/2007;  b. Echo 02/2011 EF 50-55%, inf HK, Gr 1 DD   . DDD (degenerative disc disease), lumbar   . Hearing deficit     a. wear bilateral hearing aides  . AICD (automatic cardioverter/defibrillator) present 11/2007    a. 11/2007 [facility] Current VR - single lead ICD  . Chest pain     a. 10/2007 Cath:  normal Cors.  . WPW (Wolff-Parkinson-White syndrome)     a. s/p RFCA @ [facility] - 1999    Past Surgical History  Procedure Date  . Back surgery     1995  . Appendectomy   . Cardiac defibrillator placement   . Cholecystectomy   . Ablation for svt     at  [facility]  . Elbow surgery 2011, nov  . Cholecystectomy, laparoscopic     11/2007    Family History  Problem Relation Age of Onset  . Heart attack Mother 77  . Heart attack Father 77  . Heart attack Brother 55  . Hypertension Mother   . Hypertension Father   . Hypertension Brother   . Stroke Maternal Grandmother 80  . Diabetes Mother   . Diabetes Father   . Diabetes Brother     History  Substance Use Topics  . Smoking status: Former Smoker -- 60 years    Types: Cigarettes    Quit date: 07/30/2010  . Smokeless tobacco: Never Used  . Alcohol Use: 0.6 oz/week    1 Cans of beer per week      Review of Systems  Constitutional: Negative for fever and chills.  HENT: Negative for neck pain and neck stiffness.   Eyes: Negative for pain.  Respiratory: Negative for shortness of breath.   Cardiovascular: Negative for chest pain.  Gastrointestinal: Positive for vomiting. Negative for blood in stool and abdominal distention.  Genitourinary: Negative for dysuria.  Musculoskeletal: Negative for back pain.  Skin: Negative for rash.  Neurological: Negative for headaches.  All other systems reviewed and are negative.    Allergies  Review of patient's allergies  indicates no known allergies.  Home Medications   Current Outpatient Rx  Name Route Sig Dispense Refill  . ALLOPURINOL 300 MG PO TABS Oral Take 300 mg by mouth daily.     Marland Kitchen CARVEDILOL 25 MG PO TABS Oral Take 25 mg by mouth 2 (two) times daily with a meal.    . CLOPIDOGREL BISULFATE 75 MG PO TABS Oral Take 1 tablet (75 mg total) by mouth daily with breakfast. 30 tablet 3  . DIGOXIN 0.125 MG PO TABS Oral Take 125 mcg by mouth daily.     Marland Kitchen LISINOPRIL 20 MG PO TABS Oral Take 20 mg by mouth daily.    Marland Kitchen METFORMIN HCL 500 MG PO TABS Oral Take 1 tablet (500 mg total) by mouth 2 (two) times daily with a meal. 60 tablet 3  . OXYCODONE-ACETAMINOPHEN 10-325 MG PO TABS Oral Take 1 tablet by mouth every 6 (six) hours as needed. For  pain    . PANTOPRAZOLE SODIUM 40 MG PO TBEC Oral Take 40 mg by mouth 2 (two) times daily.     . SUCRALFATE 1 G PO TABS Oral Take 1 g by mouth 4 (four) times daily.      BP 115/72  Pulse 75  Temp 98.3 F (36.8 C) (Oral)  Resp 14  Ht 6\' 1"  (1.854 m)  Wt 235 lb (106.595 kg)  BMI 31.00 kg/m2  SpO2 98%  Physical Exam  Constitutional: He is oriented to person, place, and time. He appears well-developed and well-nourished.  HENT:  Head: Normocephalic and atraumatic.       mmm  Eyes: Conjunctivae and EOM are normal. Pupils are equal, round, and reactive to light.  Neck: Trachea normal. Neck supple. No thyromegaly present.  Cardiovascular: Normal rate, regular rhythm, S1 normal, S2 normal and normal pulses.     No systolic murmur is present   No diastolic murmur is present  Pulses:      Radial pulses are 2+ on the right side, and 2+ on the left side.  Pulmonary/Chest: Effort normal and breath sounds normal. He has no wheezes. He has no rhonchi. He has no rales. He exhibits no tenderness.  Abdominal: Soft. Normal appearance and bowel sounds are normal. There is no rebound, no guarding, no CVA tenderness and negative Murphy's sign.       Mild epigastric tenderness.   Musculoskeletal: He exhibits no edema and no tenderness.       BLE:s Calves nontender, no cords or erythema, negative Homans sign  Neurological: He is alert and oriented to person, place, and time. He has normal strength. No cranial nerve deficit or sensory deficit. GCS eye subscore is 4. GCS verbal subscore is 5. GCS motor subscore is 6.  Skin: Skin is warm and dry. No rash noted. He is not diaphoretic.  Psychiatric: His speech is normal.       Cooperative and appropriate    ED Course  Procedures (including critical care time)  Results for orders placed during the hospital encounter of 03/27/12  CBC      Component Value Range   WBC 8.7  4.0 - 10.5 K/uL   RBC 5.11  4.22 - 5.81 MIL/uL   Hemoglobin 16.8  13.0 - 17.0  g/dL   HCT 16.1  09.6 - 04.5 %   MCV 88.8  78.0 - 100.0 fL   MCH 32.9  26.0 - 34.0 pg   MCHC 37.0 (*) 30.0 - 36.0 g/dL   RDW 40.9  81.1 - 91.4 %  Platelets 124 (*) 150 - 400 K/uL  BASIC METABOLIC PANEL      Component Value Range   Sodium 135  135 - 145 mEq/L   Potassium 4.0  3.5 - 5.1 mEq/L   Chloride 94 (*) 96 - 112 mEq/L   CO2 30  19 - 32 mEq/L   Glucose, Bld 132 (*) 70 - 99 mg/dL   BUN 23  6 - 23 mg/dL   Creatinine, Ser 1.61 (*) 0.50 - 1.35 mg/dL   Calcium 09.6  8.4 - 04.5 mg/dL   GFR calc non Af Amer 54 (*) >90 mL/min   GFR calc Af Amer 62 (*) >90 mL/min  TYPE AND SCREEN      Component Value Range   ABO/RH(D) O POS     Antibody Screen NEG     Sample Expiration 03/30/2012    LACTIC ACID, PLASMA      Component Value Range   Lactic Acid, Venous 1.1  0.5 - 2.2 mmol/L   Dg Chest 1 View  03/27/2012  *RADIOLOGY REPORT*  Clinical Data: Hematemesis for 10 days.  History of mini stroke.  CHEST - 1 VIEW  Comparison: 03/07/2012  Findings: Shallow inspiration. The heart size and pulmonary vascularity are normal. The lungs appear clear and expanded without focal air space disease or consolidation. No blunting of the costophrenic angles.  No pneumothorax.  The mediastinal contours appear intact.  Stable appearance of cardiac pacemaker since previous study.  IMPRESSION: No evidence of active pulmonary disease.  Original Report Authenticated By: Marlon Pel, M.D.      Date: 03/27/2012  Rate: 82  Rhythm: normal sinus rhythm  QRS Axis: normal  Intervals: normal  ST/T Wave abnormalities: nonspecific ST changes  Conduction Disutrbances:none  Narrative Interpretation:   Old EKG Reviewed: unchanged  ECG reviewed WNL.  IVFs and zofran provided. IV protonix.  IV reglan provided and on recheck his hiccups resolved. Repeat H/H obtained and no anemia. No emesis in ED. BP improved with IVfs.  6:10 AM PT feesl much better and wants to go home. Tolerating fluids. Plan close outpatient f/u  with GI. Start reglan and strict return precautions for GIB verbalized as understood.    MDM   Nursing notes, VS and old records reviewed. IVFs and medications provided. ECG, CXR and labs reviewed as above.         Sunnie Nielsen, MD 03/27/12 [phone]

## 2012-03-27 NOTE — ED Notes (Signed)
Patient had TIA 2 weeks ago; patient began vomiting bright red blood 2 days ago; about an hour ago emesis became black.

## 2012-03-30 ENCOUNTER — Ambulatory Visit (INDEPENDENT_AMBULATORY_CARE_PROVIDER_SITE_OTHER): Payer: BC Managed Care – PPO | Admitting: Internal Medicine

## 2012-03-30 ENCOUNTER — Encounter (INDEPENDENT_AMBULATORY_CARE_PROVIDER_SITE_OTHER): Payer: Self-pay | Admitting: Internal Medicine

## 2012-03-30 VITALS — BP 100/62 | HR 96 | Temp 97.6°F | Ht 73.5 in | Wt 221.1 lb

## 2012-03-30 DIAGNOSIS — G459 Transient cerebral ischemic attack, unspecified: Secondary | ICD-10-CM | POA: Insufficient documentation

## 2012-03-30 DIAGNOSIS — K920 Hematemesis: Secondary | ICD-10-CM

## 2012-03-30 NOTE — Progress Notes (Signed)
Subjective:     Patient ID: Mike Floyd, male   DOB: 1961/06/13, 51 y.o.   MRN: [id number]  HPI Mike Floyd is a 51 yr old male presenting today with c/o that on Monday he was vomiting black vomitus.  At first the vomitus was bright red and then black.  Vomited blood x 1 1/2 days. No prior hx of vomiting blood. He was presently taking Plavix when this occurred. He had been on Plavix since the last of July.  He vomited x 1 yesterday and was brown. He did not see any blood.  Seen in the ED: evaluated and treated. Appetite okay. He says he has lost 15 pounds in the last month.  His BMs are dark brown, but not black. Rectal exam in the ED was negative for blood.   January 2013 EGD/ED for dysphagia:Impression:  Erosive reflux esophagitis improved but not completely healed since previous EGD 3 years ago.  3 cm size hiatal hernia.  Esophagus dilated by passing 56 [place] dilator.  Biopsy taken from small patch of salmon-colored mucosa at GEJ.  Recommendations:  Anti-reflux measures reinforced.  Continue pantoprazole at 40 mg by mouth twice a day.  I will be contacting patient with results of biopsy.  If dysphagia persists he will need esophageal manometry.  CBC    Component Value Date/Time   WBC 8.7 03/27/2012 0131   RBC 5.11 03/27/2012 0131   HGB 15.6 03/27/2012 0544   HCT 46.0 03/27/2012 0544   PLT 124* 03/27/2012 0131   MCV 88.8 03/27/2012 0131   MCH 32.9 03/27/2012 0131   MCHC 37.0* 03/27/2012 0131   RDW 13.5 03/27/2012 0131   LYMPHSABS 1.2 03/07/2012 1332   MONOABS 0.5 03/07/2012 1332   EOSABS 0.1 03/07/2012 1332   BASOSABS 0.1 03/07/2012 1332    CMP     Component Value Date/Time   NA 136 03/27/2012 0544   K 4.2 03/27/2012 0544   CL 99 03/27/2012 0544   CO2 30 03/27/2012 0131   GLUCOSE 144* 03/27/2012 0544   BUN 24* 03/27/2012 0544   CREATININE 1.40* 03/27/2012 0544   CALCIUM 10.2 03/27/2012 0131   PROT 6.9 03/07/2012 1332   ALBUMIN 4.5 03/07/2012 1332   AST 34 03/07/2012 1332   ALT 53  03/07/2012 1332   ALKPHOS 104 03/07/2012 1332   BILITOT 1.0 03/07/2012 1332   GFRNONAA 54* 03/27/2012 0131   GFRAA 62* 03/27/2012 0131        Review of Systems see hpi Current Outpatient Prescriptions  Medication Sig Dispense Refill  . allopurinol (ZYLOPRIM) 300 MG tablet Take 300 mg by mouth daily.       . carvedilol (COREG) 25 MG tablet Take 25 mg by mouth 2 (two) times daily with a meal.      . digoxin (LANOXIN) 0.125 MG tablet Take 125 mcg by mouth daily.       Marland Kitchen lisinopril (PRINIVIL,ZESTRIL) 20 MG tablet Take 20 mg by mouth daily.      . metFORMIN (GLUCOPHAGE) 500 MG tablet Take 1 tablet (500 mg total) by mouth 2 (two) times daily with a meal.  60 tablet  3  . metoCLOPramide (REGLAN) 10 MG tablet Take 1 tablet (10 mg total) by mouth every 6 (six) hours.  30 tablet  0  . oxyCODONE-acetaminophen (PERCOCET) 10-325 MG per tablet Take 1 tablet by mouth every 6 (six) hours as needed. For pain      . pantoprazole (PROTONIX) 40 MG tablet Take 40 mg by mouth  2 (two) times daily.       . sucralfate (CARAFATE) 1 G tablet Take 1 g by mouth 4 (four) times daily.      . clopidogrel (PLAVIX) 75 MG tablet Take 1 tablet (75 mg total) by mouth daily with breakfast.  30 tablet  3   Past Medical History  Diagnosis Date  . Diabetes mellitus DX: 2010  . Gout   . Hypertension   . CKD (chronic kidney disease), stage II   . Erosive esophagitis     a. per EGD (08/2011), Dr. Karilyn Cota - Erosive reflux esophagitis improved but not completely healed since previous EGD 3 years ago. Bx showing  ulcerated gatroesophageal junction mucosa. negative for H. pylori  . Nonischemic dilated cardiomyopathy     a. H/O EF as low as 35-40% by LV gram 10/2007;  b. Echo 02/2011 EF 50-55%, inf HK, Gr 1 DD   . DDD (degenerative disc disease), lumbar   . Hearing deficit     a. wear bilateral hearing aides  . AICD (automatic cardioverter/defibrillator) present 11/2007    a. 11/2007 [facility] Current VR - single lead ICD  . Chest pain      a. 10/2007 Cath:  normal Cors.  . WPW (Wolff-Parkinson-White syndrome)     a. s/p RFCA @ [facility] - 1999  . TIA (transient ischemic attack)     July, 2013   Past Surgical History  Procedure Date  . Back surgery     1995  . Appendectomy   . Cardiac defibrillator placement   . Cholecystectomy   . Ablation for svt     at [facility]  . Elbow surgery 2011, nov  . Cholecystectomy, laparoscopic     11/2007   Family Status  Relation Status Death Age  . Mother Alive     DM2, CAD, Kidney failure, Nursing Home.  . Father Deceased     CAD  . Sister Alive     good health  . Brother Alive     One has DM. One in good health   History   Social History  . Marital Status: Married    Spouse Name: N/A    Number of Children: N/A  . Years of Education: N/A   Occupational History  . Worked for [employer] off in 11/11   Social History Main Topics  . Smoking status: Former Smoker -- 60 years    Types: Cigarettes    Quit date: 07/30/2010  . Smokeless tobacco: Never Used  . Alcohol Use: 0.6 oz/week    1 Cans of beer per week  . Drug Use: No  . Sexually Active: Not on file   Other Topics Concern  . Not on file   Social History Narrative  . No narrative on file   No Known Allergies      Objective:   Physical Exam  Filed Vitals:   03/30/12 0920  Height: 6' 1.5" (1.867 m)  Weight: 221 lb 1.6 oz (100.29 kg)  Alert and oriented. Skin warm and dry. Oral mucosa is moist.   . Sclera anicteric, conjunctivae is pink. Thyroid not enlarged. No cervical lymphadenopathy. Lungs clear. Heart regular rate and rhythm.  Abdomen is soft. Bowel sounds are positive. No hepatomegaly. No abdominal masses felt. No tenderness.  No edema to lower extremities. Stool brown, guaiac positive.    Assessment:    Hematemesis.   PUD needs to be ruled out. Hx of erosive esophagitis. From the plavix.I discussed  this case with Dr. Karilyn Cota.    Plan:    EGD. Continue Protonix BID and Carafate four times  a day.

## 2012-03-30 NOTE — Patient Instructions (Addendum)
EGD. The risks and benefits such as perforation, bleeding, and infection were reviewed with the patient and is agreeable. Continue Protonix twice a day and Carafate four times a day before meals and at bedtime

## 2012-03-31 ENCOUNTER — Encounter (HOSPITAL_COMMUNITY): Payer: Self-pay | Admitting: *Deleted

## 2012-03-31 ENCOUNTER — Ambulatory Visit (HOSPITAL_COMMUNITY)
Admission: RE | Admit: 2012-03-31 | Discharge: 2012-03-31 | Disposition: A | Discharge status: Home / Self Care | Payer: BC Managed Care – PPO | Source: Ambulatory Visit | Attending: Internal Medicine | Admitting: Internal Medicine

## 2012-03-31 ENCOUNTER — Encounter (HOSPITAL_COMMUNITY)
Admission: RE | Disposition: A | Discharge status: Home / Self Care | Payer: Self-pay | Source: Ambulatory Visit | Attending: Internal Medicine

## 2012-03-31 DIAGNOSIS — E119 Type 2 diabetes mellitus without complications: Secondary | ICD-10-CM | POA: Insufficient documentation

## 2012-03-31 DIAGNOSIS — K2100 Gastro-esophageal reflux disease with esophagitis, without bleeding: Secondary | ICD-10-CM | POA: Insufficient documentation

## 2012-03-31 DIAGNOSIS — K922 Gastrointestinal hemorrhage, unspecified: Secondary | ICD-10-CM

## 2012-03-31 DIAGNOSIS — K208 Other esophagitis: Secondary | ICD-10-CM

## 2012-03-31 DIAGNOSIS — K449 Diaphragmatic hernia without obstruction or gangrene: Secondary | ICD-10-CM

## 2012-03-31 DIAGNOSIS — I10 Essential (primary) hypertension: Secondary | ICD-10-CM | POA: Insufficient documentation

## 2012-03-31 DIAGNOSIS — Z01812 Encounter for preprocedural laboratory examination: Secondary | ICD-10-CM | POA: Insufficient documentation

## 2012-03-31 HISTORY — PX: ESOPHAGOGASTRODUODENOSCOPY: SHX5428

## 2012-03-31 LAB — GLUCOSE, CAPILLARY: Glucose-Capillary: 106 mg/dL — ABNORMAL HIGH (ref 70–99)

## 2012-03-31 SURGERY — EGD (ESOPHAGOGASTRODUODENOSCOPY)
Anesthesia: Moderate Sedation

## 2012-03-31 MED ORDER — SODIUM CHLORIDE 0.45 % IV SOLN
INTRAVENOUS | Status: DC
  Administered 2012-03-31: 15:00:00 via INTRAVENOUS

## 2012-03-31 MED ORDER — DEXLANSOPRAZOLE 60 MG PO CPDR: 60.0000 mg | DELAYED_RELEASE_CAPSULE | Freq: Every day | ORAL | Status: DC

## 2012-03-31 MED ORDER — MIDAZOLAM HCL 5 MG/5ML IJ SOLN
INTRAMUSCULAR | Status: DC | PRN
  Administered 2012-03-31: 2 mg via INTRAVENOUS
  Administered 2012-03-31 (×2): 3 mg via INTRAVENOUS
  Administered 2012-03-31: 2 mg via INTRAVENOUS
  Administered 2012-03-31: 3 mg via INTRAVENOUS
  Administered 2012-03-31: 2 mg via INTRAVENOUS

## 2012-03-31 MED ORDER — MEPERIDINE HCL 25 MG/ML IJ SOLN
INTRAMUSCULAR | Status: DC | PRN
  Administered 2012-03-31 (×2): 25 mg via INTRAVENOUS

## 2012-03-31 MED ORDER — STERILE WATER FOR IRRIGATION IR SOLN
Status: DC | PRN
  Administered 2012-03-31: 16:00:00

## 2012-03-31 MED ORDER — MEPERIDINE HCL 50 MG/ML IJ SOLN
INTRAMUSCULAR | Status: AC
  Filled 2012-03-31: qty 1

## 2012-03-31 MED ORDER — BUTAMBEN-TETRACAINE-BENZOCAINE 2-2-14 % EX AERO
INHALATION_SPRAY | CUTANEOUS | Status: DC | PRN
  Administered 2012-03-31: 2 via TOPICAL

## 2012-03-31 MED ORDER — MIDAZOLAM HCL 5 MG/5ML IJ SOLN
INTRAMUSCULAR | Status: AC
  Filled 2012-03-31: qty 10

## 2012-03-31 MED ORDER — MIDAZOLAM HCL 5 MG/5ML IJ SOLN
INTRAMUSCULAR | Status: AC
  Filled 2012-03-31: qty 5

## 2012-03-31 NOTE — Op Note (Signed)
EGD PROCEDURE REPORT  PATIENT:  Mike Floyd  MR#:  [id number] Birthdate:  1960-12-27, 51 y.o., male Endoscopist:  Dr. Malissa Hippo, MD Referred By:  Dr. Madelin Rear. Fusco, MD Procedure Date: 03/31/2012  Procedure:   EGD  Indications:  Patient is 51 year old Caucasian male with multiple medical problems was history of erosive esophagitis and stop his PPI recently because of his inability to pay for orders medications and experience upper GI bleed over the weekend seen in emergency room. He was begun on Plavix about a month ago which was discontinued and he was discharged on pantoprazole and metoclopramide. He has not experienced any more episodes of hematemesis or melena.            Informed Consent:  The risks, benefits, alternatives & imponderables which include, but are not limited to, bleeding, infection, perforation, drug reaction and potential missed lesion have been reviewed.  The potential for biopsy, lesion removal, esophageal dilation, etc. have also been discussed.  Questions have been answered.  All parties agreeable.  Please see history & physical in medical record for more information.  Medications:  Demerol 50 mg IV Versed 15 mg IV Cetacaine spray topically for oropharyngeal anesthesia  Description of procedure:  The endoscope was introduced through the mouth and advanced to the second portion of the duodenum without difficulty or limitations. The mucosal surfaces were surveyed very carefully during advancement of the scope and upon withdrawal.  Findings:  Esophagus:  Mucosa of the proximal and middle third was normal. Triangular ulcer noted at GE junction over 10 mm long extending to GE junction. For more smaller ulcers noted at GE junction extending into distal esophagus for  few mm. GEJ:  37 cm Hiatus:  41 cm Stomach:  Stomach was empty and distended very well with insufflation. Folds in the proximal stomach were normal. Examination of mucosa at body, antrum, pyloric  channel, angularis fundus and cardia was normal. Duodenum:  Normal bulbar and post bulbar mucosa.  Therapeutic/Diagnostic Maneuvers Performed:  None  Complications:  None  Impression: Erosive/ulcerative reflux esophagitis which has gotten worse since last EGD of January 2013.  Recommendations:  Anti-reflux measures reinforced. Discontinue pantoprazole. Start Dexilant 60 mg by mouth 30 minutes before breakfast daily (no interaction with Plavix). Resume Plavix at usual dose. Solid phase gastric emptying study to be scheduled. He will stop metoclopramide when he runs out of prescription or if he have side effects.   Hallee Mckenny U  03/31/2012  4:37 PM  CC: Dr. Cassell Smiles., MD & Dr. Bonnetta Barry ref. provider found      Plaxvix

## 2012-03-31 NOTE — H&P (Signed)
Mike Floyd is an 51 y.o. male.   Chief Complaint: Patient is here for esophagogastroduodenoscopy. HPI: Patient is 51 year old Caucasian male with multiple medical problems who recently suffered right CVA and has recovered great deal but not 100% he was begun on Plavix. He has history of erosive/ulcerative reflux esophagitis. He got sick on 03/26/2012. He vomited after eating his breakfast. He had vomited bright red blood which subsequently turned into coffee grounds. He came to emergency room around midnight. He was evaluated and discharged. He was seen in the office yesterday. Hemodynamically stable but stool was guaiac-positive. His antiplatelet or abuse on hold. He had bad heartburn for 2 days over the weekend but it's resolved. He complains of pain across upper abdomen and she uses secondary to multiple episodes of vomiting.  Past Medical History  Diagnosis Date  . Diabetes mellitus DX: 2010  . Gout   . Hypertension   . CKD (chronic kidney disease), stage II   . Erosive esophagitis     a. per EGD (08/2011), Dr. Karilyn Cota - Erosive reflux esophagitis improved but not completely healed since previous EGD 3 years ago. Bx showing  ulcerated gatroesophageal junction mucosa. negative for H. pylori  . Nonischemic dilated cardiomyopathy     a. H/O EF as low as 35-40% by LV gram 10/2007;  b. Echo 02/2011 EF 50-55%, inf HK, Gr 1 DD   . DDD (degenerative disc disease), lumbar   . Hearing deficit     a. wear bilateral hearing aides  . AICD (automatic cardioverter/defibrillator) present 11/2007    a. 11/2007 [facility] Current VR - single lead ICD  . Chest pain     a. 10/2007 Cath:  normal Cors.  . WPW (Wolff-Parkinson-White syndrome)     a. s/p RFCA @ [facility] - 1999  . TIA (transient ischemic attack)     July, 2013    Past Surgical History  Procedure Date  . Back surgery     1995  . Appendectomy   . Cardiac defibrillator placement   . Cholecystectomy   . Ablation for svt     at [facility]  . Elbow  surgery 2011, nov  . Cholecystectomy, laparoscopic     11/2007    Family History  Problem Relation Age of Onset  . Heart attack Mother 33  . Heart attack Father 35  . Heart attack Brother 55  . Hypertension Mother   . Hypertension Father   . Hypertension Brother   . Stroke Maternal Grandmother 80  . Diabetes Mother   . Diabetes Father   . Diabetes Brother    Social History:  reports that he quit smoking about 20 months ago. His smoking use included Cigarettes. He quit after 60 years of use. He has never used smokeless tobacco. He reports that he drinks about .6 ounces of alcohol per week. He reports that he does not use illicit drugs.  Allergies: No Known Allergies  Medications Prior to Admission  Medication Sig Dispense Refill  . allopurinol (ZYLOPRIM) 300 MG tablet Take 300 mg by mouth daily.       . carvedilol (COREG) 25 MG tablet Take 25 mg by mouth 2 (two) times daily with a meal.      . digoxin (LANOXIN) 0.125 MG tablet Take 125 mcg by mouth daily.       Marland Kitchen lisinopril (PRINIVIL,ZESTRIL) 20 MG tablet Take 20 mg by mouth daily.      . metFORMIN (GLUCOPHAGE) 500 MG tablet Take 1 tablet (500 mg  total) by mouth 2 (two) times daily with a meal.  60 tablet  3  . metoCLOPramide (REGLAN) 10 MG tablet Take 1 tablet (10 mg total) by mouth every 6 (six) hours.  30 tablet  0  . oxyCODONE-acetaminophen (PERCOCET) 10-325 MG per tablet Take 1 tablet by mouth every 6 (six) hours as needed. For pain      . pantoprazole (PROTONIX) 40 MG tablet Take 40 mg by mouth 2 (two) times daily.       . sucralfate (CARAFATE) 1 G tablet Take 1 g by mouth 4 (four) times daily.      . clopidogrel (PLAVIX) 75 MG tablet Take 1 tablet (75 mg total) by mouth daily with breakfast.  30 tablet  3    Results for orders placed during the hospital encounter of 03/31/12 (from the past 48 hour(s))  GLUCOSE, CAPILLARY     Status: Abnormal   Collection Time   03/31/12  3:03 PM      Component Value Range Comment    Glucose-Capillary 106 (*) 70 - 99 mg/dL    Comment 1 Documented in Chart      No results found.  ROS  Blood pressure 134/89, pulse 86, temperature 97.8 F (36.6 C), temperature source Oral, resp. rate 14, height 6' 1.5" (1.867 m), weight 221 lb (100.245 kg), SpO2 94.00%. Physical Exam  Constitutional: He appears well-developed and well-nourished.  HENT:  Mouth/Throat: Oropharynx is clear and moist.  Eyes: Conjunctivae are normal. No scleral icterus.  Neck: No thyromegaly present.  Cardiovascular: Normal rate, regular rhythm and normal heart sounds.   No murmur heard. Respiratory: Effort normal and breath sounds normal.  GI: Soft. He exhibits no mass. There is tenderness (mild epigastric tenderness).  Musculoskeletal: He exhibits no edema.       multiple tattoos over both forarms. Slight decrease in left grip compared to right.  Lymphadenopathy:    He has no cervical adenopathy.  Neurological: He is alert.  Skin: Skin is warm and dry.     Assessment/Plan Upper GI bleed. History of erosive reflux esophagitis. Diagnostic EGD.  Yaritzy Huser U 03/31/2012, 4:04 PM

## 2012-04-03 ENCOUNTER — Other Ambulatory Visit (INDEPENDENT_AMBULATORY_CARE_PROVIDER_SITE_OTHER): Payer: Self-pay | Admitting: Internal Medicine

## 2012-04-04 ENCOUNTER — Encounter (HOSPITAL_COMMUNITY): Payer: Self-pay

## 2012-04-04 ENCOUNTER — Encounter (HOSPITAL_COMMUNITY)
Admission: RE | Admit: 2012-04-04 | Discharge: 2012-04-04 | Disposition: A | Discharge status: Home / Self Care | Payer: BC Managed Care – PPO | Source: Ambulatory Visit | Attending: Internal Medicine | Admitting: Internal Medicine

## 2012-04-04 ENCOUNTER — Encounter: Payer: BC Managed Care – PPO | Admitting: Internal Medicine

## 2012-04-04 DIAGNOSIS — K2100 Gastro-esophageal reflux disease with esophagitis, without bleeding: Secondary | ICD-10-CM | POA: Insufficient documentation

## 2012-04-04 DIAGNOSIS — E119 Type 2 diabetes mellitus without complications: Secondary | ICD-10-CM | POA: Insufficient documentation

## 2012-04-04 DIAGNOSIS — R109 Unspecified abdominal pain: Secondary | ICD-10-CM | POA: Insufficient documentation

## 2012-04-04 DIAGNOSIS — R112 Nausea with vomiting, unspecified: Secondary | ICD-10-CM | POA: Insufficient documentation

## 2012-04-04 MED ORDER — TECHNETIUM TC 99M SULFUR COLLOID
2.0000 | Freq: Once | INTRAVENOUS | Status: AC | PRN
Start: 2012-04-04 — End: 2012-04-04
  Administered 2012-04-04: 2.2 via ORAL

## 2012-04-13 ENCOUNTER — Encounter (INDEPENDENT_AMBULATORY_CARE_PROVIDER_SITE_OTHER): Payer: Self-pay | Admitting: *Deleted

## 2012-04-14 ENCOUNTER — Other Ambulatory Visit (INDEPENDENT_AMBULATORY_CARE_PROVIDER_SITE_OTHER): Payer: Self-pay | Admitting: Internal Medicine

## 2012-05-04 ENCOUNTER — Other Ambulatory Visit (INDEPENDENT_AMBULATORY_CARE_PROVIDER_SITE_OTHER): Payer: Self-pay | Admitting: Internal Medicine

## 2012-06-09 ENCOUNTER — Other Ambulatory Visit: Payer: Self-pay | Admitting: Adult Health

## 2012-08-03 ENCOUNTER — Other Ambulatory Visit (HOSPITAL_COMMUNITY): Payer: Self-pay | Admitting: Internal Medicine

## 2012-08-03 DIAGNOSIS — R109 Unspecified abdominal pain: Secondary | ICD-10-CM

## 2012-08-03 DIAGNOSIS — R319 Hematuria, unspecified: Secondary | ICD-10-CM

## 2012-08-04 ENCOUNTER — Ambulatory Visit (HOSPITAL_COMMUNITY)
Admission: RE | Admit: 2012-08-04 | Discharge: 2012-08-04 | Disposition: A | Discharge status: Home / Self Care | Payer: BC Managed Care – PPO | Source: Ambulatory Visit | Attending: Internal Medicine | Admitting: Internal Medicine

## 2012-08-04 DIAGNOSIS — R109 Unspecified abdominal pain: Secondary | ICD-10-CM

## 2012-08-04 DIAGNOSIS — N200 Calculus of kidney: Secondary | ICD-10-CM | POA: Insufficient documentation

## 2012-08-04 DIAGNOSIS — K5730 Diverticulosis of large intestine without perforation or abscess without bleeding: Secondary | ICD-10-CM | POA: Insufficient documentation

## 2012-08-04 DIAGNOSIS — R319 Hematuria, unspecified: Secondary | ICD-10-CM

## 2012-08-04 MED ORDER — IOHEXOL 300 MG/ML  SOLN
100.0000 mL | Freq: Once | INTRAMUSCULAR | Status: AC | PRN
  Administered 2012-08-04: 100 mL via INTRAVENOUS

## 2012-08-08 ENCOUNTER — Encounter: Payer: Self-pay | Admitting: *Deleted

## 2012-08-21 ENCOUNTER — Other Ambulatory Visit: Payer: Self-pay | Admitting: Adult Health

## 2012-09-05 ENCOUNTER — Encounter: Payer: Self-pay | Admitting: Cardiology

## 2012-09-05 ENCOUNTER — Ambulatory Visit (INDEPENDENT_AMBULATORY_CARE_PROVIDER_SITE_OTHER): Payer: BC Managed Care – PPO | Admitting: Urology

## 2012-09-05 ENCOUNTER — Ambulatory Visit (INDEPENDENT_AMBULATORY_CARE_PROVIDER_SITE_OTHER): Payer: BC Managed Care – PPO | Admitting: Cardiology

## 2012-09-05 VITALS — BP 136/82 | HR 95 | Ht 73.5 in | Wt 230.4 lb

## 2012-09-05 DIAGNOSIS — K4090 Unilateral inguinal hernia, without obstruction or gangrene, not specified as recurrent: Secondary | ICD-10-CM

## 2012-09-05 DIAGNOSIS — R3129 Other microscopic hematuria: Secondary | ICD-10-CM

## 2012-09-05 DIAGNOSIS — M109 Gout, unspecified: Secondary | ICD-10-CM

## 2012-09-05 DIAGNOSIS — D126 Benign neoplasm of colon, unspecified: Secondary | ICD-10-CM

## 2012-09-05 DIAGNOSIS — Z95810 Presence of automatic (implantable) cardiac defibrillator: Secondary | ICD-10-CM

## 2012-09-05 DIAGNOSIS — I428 Other cardiomyopathies: Secondary | ICD-10-CM

## 2012-09-05 DIAGNOSIS — I2510 Atherosclerotic heart disease of native coronary artery without angina pectoris: Secondary | ICD-10-CM

## 2012-09-05 DIAGNOSIS — G459 Transient cerebral ischemic attack, unspecified: Secondary | ICD-10-CM

## 2012-09-05 DIAGNOSIS — R7402 Elevation of levels of lactic acid dehydrogenase (LDH): Secondary | ICD-10-CM

## 2012-09-05 DIAGNOSIS — F17200 Nicotine dependence, unspecified, uncomplicated: Secondary | ICD-10-CM

## 2012-09-05 DIAGNOSIS — K219 Gastro-esophageal reflux disease without esophagitis: Secondary | ICD-10-CM

## 2012-09-05 MED ORDER — CARVEDILOL 25 MG PO TABS: 50.0000 mg | ORAL_TABLET | Freq: Two times a day (BID) | ORAL | Status: DC

## 2012-09-05 NOTE — Assessment & Plan Note (Signed)
Patient presented with clear focal left-sided weakness but a question of a preceding seizure and no evidence for cerebrovascular disease or other vascular cause. Lifelong therapy with clopidogrel is probably not necessary; a one-year course would be reasonable.

## 2012-09-05 NOTE — Patient Instructions (Addendum)
Your physician recommends that you schedule a follow-up appointment in: 10 months  Your physician has recommended you make the following change in your medication:  1 - INCREASE Coreg to 50 mg twice a day 2 - STOP Digoxin  Your physician recommends that you return for lab work in: Today

## 2012-09-05 NOTE — Assessment & Plan Note (Addendum)
Ventricular tachycardia/fibrillation is unlikely with recovery of left ventricular systolic function; however, patient develops a fairly pronounced sinus tachycardia at times. Dose of carvedilol will be increased in an attempt to prevent inappropriate AICD discharge

## 2012-09-05 NOTE — Progress Notes (Deleted)
Name: Mike Floyd    DOB: 01-21-1961  Age: 53 y.o.  MR#: [id number]       PCP:  Cassell Smiles., MD      Insurance: @PAYORNAME @   CC:   No chief complaint on file.  MEDICATION BOTTLES CUS - 03/10/19/13 ECHO WITH BUBBLE STUDY 03/10/12 SECONDARY TO CVA  VS BP 136/82  Pulse 95  Ht 6' 1.5" (1.867 m)  Wt 230 lb 6.4 oz (104.509 kg)  BMI 29.99 kg/m2  SpO2 98%  Weights Current Weight  09/05/12 230 lb 6.4 oz (104.509 kg)  03/31/12 221 lb (100.245 kg)  03/31/12 221 lb (100.245 kg)    Blood Pressure  BP Readings from Last 3 Encounters:  09/05/12 136/82  03/31/12 120/85  03/31/12 120/85     Admit date:  (Not on file) Last encounter with RMR:  Visit date not found   Allergy No Known Allergies  Current Outpatient Prescriptions  Medication Sig Dispense Refill  . allopurinol (ZYLOPRIM) 300 MG tablet Take 300 mg by mouth daily.       Marland Kitchen CARAFATE 1 G tablet TAKE (1) TABLET BY MOUTH (4) TIMES DAILY. TAKE 30 MINUTES BEFORE MEALS AND BEDTIME.  120 tablet  4  . carvedilol (COREG) 25 MG tablet TAKE 1 TABLET BY MOUTH TWICE DAILY.  60 tablet  6  . clopidogrel (PLAVIX) 75 MG tablet Take 1 tablet (75 mg total) by mouth daily with breakfast.  30 tablet  3  . digoxin (LANOXIN) 0.125 MG tablet Take 125 mcg by mouth daily.       . hydrochlorothiazide (MICROZIDE) 12.5 MG capsule TAKE (1) CAPSULE BY MOUTH ONCE DAILY.  30 capsule  6  . HYDROcodone-acetaminophen (LORTAB) 10-500 MG per tablet Take 1 tablet by mouth every 6 (six) hours as needed.      Marland Kitchen lisinopril (PRINIVIL,ZESTRIL) 20 MG tablet TAKE ONE TABLET DAILY.  30 tablet  6  . metFORMIN (GLUCOPHAGE) 500 MG tablet Take 1 tablet (500 mg total) by mouth 2 (two) times daily with a meal.  60 tablet  3    Discontinued Meds:    Medications Discontinued During This Encounter  Medication Reason  . oxyCODONE-acetaminophen (PERCOCET) 10-325 MG per tablet Error  . metoCLOPramide (REGLAN) 10 MG tablet Error  . dexlansoprazole (DEXILANT) 60 MG capsule  Error    Patient Active Problem List  Diagnosis  . COLONIC POLYPS  . DIABETES MELLITUS, TYPE II  . GOUT  . POLYCYTHEMIA  . TOBACCO ABUSE  . CARPAL TUNNEL SYNDROME  . HEARING IMPAIRMENT  . CHRONIC SYSTOLIC HEART FAILURE  . GERD  . KNEE PAIN  . BURSITIS, LEFT ELBOW  . OTHER CHEST PAIN  . TRANSAMINASES, SERUM, ELEVATED  . AUTOMATIC IMPLANTABLE CARDIAC DEFIBRILLATOR SITU  . CAD (coronary artery disease)  . Dysphagia  . Weakness of left arm  . Weakness of left leg  . TIA (transient ischemic attack)  . Hematemesis    LABS No visits with results within 3 Month(s) from this visit. Latest known visit with results is:  Admission on 03/31/2012, Discharged on 03/31/2012  Component Date Value  . Glucose-Capillary 03/31/2012 106*  . Comment 1 03/31/2012 Documented in Chart      Results for this Opt Visit:     Results for orders placed during the hospital encounter of 03/31/12  GLUCOSE, CAPILLARY      Component Value Range   Glucose-Capillary 106 (*) 70 - 99 mg/dL   Comment 1 Documented in Chart      EKG  Orders placed during the hospital encounter of 03/27/12  . ED EKG  . ED EKG  . EKG 12-LEAD  . EKG 12-LEAD  . EKG     Prior Assessment and Plan Problem List as of 09/05/2012            Cardiology Problems   CHRONIC SYSTOLIC HEART FAILURE   Last Assessment & Plan Note   05/03/2011 Office Visit Signed 05/03/2011  2:20 PM by Jodelle Gross, NP    He appears stable from cardiac standpoint. Echocardiogram is actually improved since last year with EF now at 50-55% from 40-45%.  He is breathing better with institution of HCTZ.  PFT's do not demonstrate any lung disease or respiratory defects.  He admits that he does not wear CPAP at night as directed, as he cannot breath well with it. I have advised him to see his PCP about having another mask fitted to allow him to tolerate this more. He will follow-up with Dr. Dietrich Pates in 6 months. Refills on coreg, and lisinopril are  provided.    CAD (coronary artery disease)   TIA (transient ischemic attack)     Other   COLONIC POLYPS   DIABETES MELLITUS, TYPE II   GOUT   POLYCYTHEMIA   TOBACCO ABUSE   Last Assessment & Plan Note   03/17/2011 Office Visit Signed 03/17/2011 12:27 PM by Marinus Maw, MD    I continue to encourage him to stop smoking. He will try to reduce his tobacco use.    CARPAL TUNNEL SYNDROME   HEARING IMPAIRMENT   GERD   KNEE PAIN   BURSITIS, LEFT ELBOW   OTHER CHEST PAIN   TRANSAMINASES, SERUM, ELEVATED   AUTOMATIC IMPLANTABLE CARDIAC DEFIBRILLATOR SITU   Last Assessment & Plan Note   05/03/2011 Office Visit Signed 05/03/2011  2:21 PM by Jodelle Gross, NP    He is due to see Dr. Ladona Ridgel for check in Oct or November.  He says that the pacemaker people have not called him to do over the phone pacemaker checks like he has had in the past.  Having not heard from them in months. I will have our nurse follow-up with Gunnar Fusi RN to find out more about this.    Dysphagia   Weakness of left arm   Weakness of left leg   Hematemesis       Imaging: No results found.   FRS Calculation: Score not calculated. Missing: Total Cholesterol

## 2012-09-05 NOTE — Progress Notes (Signed)
Patient ID: Mike Floyd, male   DOB: 1961/01/13, 52 y.o.   MRN: [id number]  HPI: Scheduled return visit for this nice gentleman with a history of cardiomyopathy, but substantial improvement in left ventricular systolic function. Since his last visit, he has done well from a cardiac standpoint; however, he has been troubled by erosive esophagitis unresponsive to initial doses of a PPI and a TIA in 02/2012 for which no specific etiology was identified and for which treatment with clopidogrel was initiated. He denies any subsequent neurologic symptoms. He has reasonable exercise tolerance.  Prior to Admission medications   Medication Sig Start Date End Date Taking? Authorizing Provider  allopurinol (ZYLOPRIM) 300 MG tablet Take 300 mg by mouth daily.    Yes Historical Provider, MD  CARAFATE 1 G tablet TAKE (1) TABLET BY MOUTH (4) TIMES DAILY. TAKE 30 MINUTES BEFORE MEALS AND BEDTIME. 05/04/12  Yes Len Blalock, NP  carvedilol (COREG) 25 MG tablet Take 2 tablets (50 mg total) by mouth 2 (two) times daily with a meal. 09/05/12  Yes Kathlen Brunswick, MD  clopidogrel (PLAVIX) 75 MG tablet Take 1 tablet (75 mg total) by mouth daily with breakfast. 03/10/12 03/10/13 Yes Elyse Jarvis, MD  hydrochlorothiazide (MICROZIDE) 12.5 MG capsule TAKE (1) CAPSULE BY MOUTH ONCE DAILY. 06/09/12  Yes Jodelle Gross, NP  HYDROcodone-acetaminophen (LORTAB) 10-500 MG per tablet Take 1 tablet by mouth every 6 (six) hours as needed.   Yes Historical Provider, MD  lisinopril (PRINIVIL,ZESTRIL) 20 MG tablet TAKE ONE TABLET DAILY. 08/21/12  Yes Kathlen Brunswick, MD  metFORMIN (GLUCOPHAGE) 500 MG tablet Take 1 tablet (500 mg total) by mouth 2 (two) times daily with a meal. 03/10/12 03/10/13  Elyse Jarvis, MD  No Known Allergies    Past medical history, social history, and family history reviewed and updated.  ROS: Denies dyspnea, orthopnea, PND, palpitations, lightheadedness or syncope. His AICD has not discharged; however, he and  his wife note tachycardia with heart rate as high as 160 when assessing his blood pressure with a home sphygmomanometer.  All other systems reviewed and are negative.  PHYSICAL EXAM: BP 136/82  Pulse 95  Ht 6' 1.5" (1.867 m)  Wt 104.509 kg (230 lb 6.4 oz)  BMI 29.99 kg/m2  SpO2 98%  General-Well developed; no acute distress Body habitus-mildly overweight Neck-No JVD; no carotid bruits Lungs-clear lung fields; resonant to percussion Cardiovascular-normal PMI; normal S1 and S2; S4 present Abdomen-normal bowel sounds; soft and non-tender without masses or organomegaly Musculoskeletal-No deformities, no cyanosis or clubbing Neurologic-Normal cranial nerves; symmetric strength and tone Skin-Warm, no significant lesions Extremities-distal pulses intact; no edema  Rhythm Strip: Limited quality tracing; sinus rhythm at a rate of 92 bpm  ASSESSMENT AND PLAN:  Sabina Bing, MD 09/05/2012 3:35 PM

## 2012-09-05 NOTE — Assessment & Plan Note (Signed)
Patient is doing well symptomatically and by assessment of ejection fraction. Digoxin is probably providing little, if any, benefit and will be discontinued.

## 2012-09-05 NOTE — Assessment & Plan Note (Signed)
LFT abnormalities have not been noted for the past few years and have apparently resolved.

## 2012-09-06 ENCOUNTER — Encounter: Payer: Self-pay | Admitting: Cardiology

## 2012-09-06 DIAGNOSIS — Z9289 Personal history of other medical treatment: Secondary | ICD-10-CM | POA: Insufficient documentation

## 2012-09-06 LAB — COMPREHENSIVE METABOLIC PANEL
ALT: 31 U/L (ref 0–53)
AST: 23 U/L (ref 0–37)
Albumin: 4.8 g/dL (ref 3.5–5.2)
Alkaline Phosphatase: 90 U/L (ref 39–117)
Potassium: 3.8 mEq/L (ref 3.5–5.3)
Sodium: 143 mEq/L (ref 135–145)
Total Protein: 6.5 g/dL (ref 6.0–8.3)

## 2012-09-14 ENCOUNTER — Ambulatory Visit (INDEPENDENT_AMBULATORY_CARE_PROVIDER_SITE_OTHER): Payer: BC Managed Care – PPO | Admitting: General Surgery

## 2012-09-21 ENCOUNTER — Ambulatory Visit (INDEPENDENT_AMBULATORY_CARE_PROVIDER_SITE_OTHER): Payer: BC Managed Care – PPO | Admitting: General Surgery

## 2012-10-10 ENCOUNTER — Ambulatory Visit: Payer: BC Managed Care – PPO | Admitting: Urology

## 2012-12-20 ENCOUNTER — Telehealth: Payer: Self-pay | Admitting: *Deleted

## 2012-12-20 NOTE — Telephone Encounter (Signed)
UEA:VWUJ vm on pt phone to advise the following: If your signs and symptoms worsen please seek medical attention in your local Emergency Room, do not attempt to drive yourself per further endangerment, call 911 for transport or have someone drive you to the ER as soon as possible.

## 2012-12-20 NOTE — Telephone Encounter (Signed)
FYI: PT DID NOT REQUEST CALL BACK PT WIFE CALLED TO SET UP APPT FOR PATIENT, DURING OUR CONVERSATION SHE STATED THAT HE HAS BEEN HAVING CP FOR A WEEK AND WILL NOT GO TO THE EMERGENCY ROOM.  PATIENT IS WITH OUT INSURNACE TILL June 1ST. I HAVE SCHEDULED APPT WITH DR [facility] FOR June 5TH.

## 2012-12-25 ENCOUNTER — Telehealth: Payer: Self-pay

## 2012-12-25 NOTE — Telephone Encounter (Signed)
Called Mike Floyd to advise Dr Macarthur Critchley notes imperative for him to be seen within 24hours via office or ED, Mike Floyd understood and will keep apt for tomorrow at 3:15pm 12-26-12

## 2012-12-25 NOTE — Telephone Encounter (Signed)
Pt advised that he did not go to the ER this weekend, offered pt apt and pt agreed to be seen, phone call transferred to [facility] per unable to overide apt availability, will review after clinic to make sure apt was made, pt understood importance to be seen/evaluated

## 2012-12-25 NOTE — Telephone Encounter (Signed)
Patient wife asked if appt is necessary, patient has no insurance until 06/01.  Advised patient to call 05/20 by 10:00 am and we would see if patient still needs appt.

## 2012-12-25 NOTE — Telephone Encounter (Signed)
Called Mike Floyd to advise Dr RR notes imperative for him to be seen within 24hours via office or ED, Mike Floyd understood and will keep apt for tomorrow at 3:15pm 12-26-12 

## 2012-12-25 NOTE — Telephone Encounter (Signed)
Patient has not had coronary artery disease in the past. Depending upon current symptoms, there are other possible serious medical problems that could account for his chest discomfort. Despite having no health insurance at present, we suggest immediate evaluation either in office or in an acute care setting such as the emergency department. Please offer patient appointment within the next 24 hours.  Mike Bing, MD

## 2012-12-26 ENCOUNTER — Encounter: Payer: Self-pay | Admitting: Cardiology

## 2012-12-26 ENCOUNTER — Ambulatory Visit (INDEPENDENT_AMBULATORY_CARE_PROVIDER_SITE_OTHER): Payer: Medicare Other | Admitting: Cardiology

## 2012-12-26 VITALS — BP 100/60 | HR 99 | Ht 73.0 in | Wt 234.0 lb

## 2012-12-26 DIAGNOSIS — R079 Chest pain, unspecified: Secondary | ICD-10-CM

## 2012-12-26 DIAGNOSIS — I428 Other cardiomyopathies: Secondary | ICD-10-CM

## 2012-12-26 DIAGNOSIS — I10 Essential (primary) hypertension: Secondary | ICD-10-CM

## 2012-12-26 DIAGNOSIS — D751 Secondary polycythemia: Secondary | ICD-10-CM

## 2012-12-26 DIAGNOSIS — H9190 Unspecified hearing loss, unspecified ear: Secondary | ICD-10-CM

## 2012-12-26 MED ORDER — NAPROXEN 375 MG PO TABS: 375.0000 mg | ORAL_TABLET | Freq: Three times a day (TID) | ORAL | Status: DC

## 2012-12-26 MED ORDER — OMEPRAZOLE 40 MG PO CPDR
40.0000 mg | DELAYED_RELEASE_CAPSULE | Freq: Every day | ORAL | Status: DC
Start: 2012-12-26 — End: 2013-07-13

## 2012-12-26 MED ORDER — METFORMIN HCL 500 MG PO TABS: 500.0000 mg | ORAL_TABLET | Freq: Two times a day (BID) | ORAL | Status: DC

## 2012-12-26 NOTE — Progress Notes (Signed)
Patient ID: Mike Floyd, male   DOB: May 13, 1961, 52 y.o.   MRN: [id number]  HPI: Schedule return visit for this nice gentleman with a history of nonischemic cardiomyopathy but subsequent recovery of left ventricular systolic function. Coronary angiography was normal in 2009. He now presents with a 3-4 day history of constant anterior chest pressure with numbness and pressure in the left arm as well. He has chronic dyspnea on exertion, but this is unchanged. There is no relationship to body position, movement, exertion or any other factor that the patient can identify.  He has not attempted to use any medication for these symptoms. He has a history of GERD, but is no longer taking a PPI.  Discussed with Dr. Lionel December, who did not recommend discontinuation of acid suppressant therapy, but had substituted Dexilant for Protonix.  Current Outpatient Prescriptions  Medication Sig Dispense Refill  . allopurinol (ZYLOPRIM) 300 MG tablet Take 300 mg by mouth daily.       Marland Kitchen CARAFATE 1 G tablet TAKE (1) TABLET BY MOUTH (4) TIMES DAILY. TAKE 30 MINUTES BEFORE MEALS AND BEDTIME.  120 tablet  4  . carvedilol (COREG) 25 MG tablet Take 2 tablets (50 mg total) by mouth 2 (two) times daily with a meal.  120 tablet  6  . clopidogrel (PLAVIX) 75 MG tablet Take 1 tablet (75 mg total) by mouth daily with breakfast.  30 tablet  3  . hydrochlorothiazide (MICROZIDE) 12.5 MG capsule TAKE (1) CAPSULE BY MOUTH ONCE DAILY.  30 capsule  6  . lisinopril (PRINIVIL,ZESTRIL) 20 MG tablet TAKE ONE TABLET DAILY.  30 tablet  6  . oxyCODONE-acetaminophen (PERCOCET) 10-325 MG per tablet Take 1 tablet by mouth every 6 (six) hours as needed for pain.      . metFORMIN (GLUCOPHAGE) 500 MG tablet Take 1 tablet (500 mg total) by mouth 2 (two) times daily with a meal.  60 tablet  3   No current facility-administered medications for this visit.   No Known Allergies   Past medical history, social history, and family history reviewed and  updated.  ROS: Denies palpitations, lightheadedness or syncope. No chest wall injury or unaccustomed activity. Lifestyle is relatively sedentary. No pleuritic discomfort. All other systems reviewed and are negative.  PHYSICAL EXAM: BP 100/60  Pulse 99  Ht 6\' 1"  (1.854 m)  Wt 106.142 kg (234 lb)  BMI 30.88 kg/m2;  Body mass index is 30.88 kg/(m^2). General-Well developed; no acute distress Body habitus-mildly overweight Neck-No JVD; no carotid bruits Lungs-clear lung fields; resonant to percussion Cardiovascular-normal PMI; normal S1 and S2; S4 present; tenderness and reproduction of his symptoms with sternal palpation Abdomen-normal bowel sounds; soft and non-tender without masses or organomegaly Musculoskeletal-No deformities, no cyanosis or clubbing Neurologic-Normal cranial nerves; symmetric strength and tone Skin-Warm, no significant lesions Extremities-distal pulses intact; no edema  EKG: Normal sinus rhythm at a rate of 99 bpm; nondiagnostic inferolateral Q waves; J-point elevation; no change when compared to previous tracing performed in 03/2012.  Edroy Bing, MD 12/26/2012  4:07 PM  ASSESSMENT AND PLAN

## 2012-12-26 NOTE — Patient Instructions (Addendum)
Your physician recommends that you schedule a follow-up appointment in: ONE WEEK WITH KL OR RR  Your physician recommends that you return for lab work in: ONE WEEK (SLIPS GIVEN) CMET  Your physician has recommended you make the following change in your medication:   1) START PRILOSEC 40MG  ONCE DAILY 2) START NAPROSYN 375MG  TAKE THREE TIMES DAILY FOR ONE WEEK

## 2012-12-26 NOTE — Assessment & Plan Note (Signed)
No evidence for cardiac decompensation by exam. Echocardiogram less than one year ago revealed a normal ejection fraction.  Cardiac imaging will not be repeated at present.

## 2012-12-26 NOTE — Progress Notes (Deleted)
Name: Mike Floyd    DOB: 04-10-1961  Age: 52 y.o.  MR#: [id number]       PCP:  Cassell Smiles., MD      Insurance: Payor: MEDICARE / Plan: MEDICARE PART B / Product Type: *No Product type* /   CC:   No chief complaint on file.  BOTTLES VS Filed Vitals:   12/26/12 1519  BP: 100/60  Pulse: 99  Height: 6\' 1"  (1.854 m)  Weight: 234 lb (106.142 kg)    Weights Current Weight  12/26/12 234 lb (106.142 kg)  09/05/12 230 lb 6.4 oz (104.509 kg)  03/31/12 221 lb (100.245 kg)    Blood Pressure  BP Readings from Last 3 Encounters:  12/26/12 100/60  09/05/12 136/82  03/31/12 120/85     Admit date:  (Not on file) Last encounter with RMR:  09/05/2012   Allergy Review of patient's allergies indicates no known allergies.  Current Outpatient Prescriptions  Medication Sig Dispense Refill  . allopurinol (ZYLOPRIM) 300 MG tablet Take 300 mg by mouth daily.       Marland Kitchen CARAFATE 1 G tablet TAKE (1) TABLET BY MOUTH (4) TIMES DAILY. TAKE 30 MINUTES BEFORE MEALS AND BEDTIME.  120 tablet  4  . carvedilol (COREG) 25 MG tablet Take 2 tablets (50 mg total) by mouth 2 (two) times daily with a meal.  120 tablet  6  . clopidogrel (PLAVIX) 75 MG tablet Take 1 tablet (75 mg total) by mouth daily with breakfast.  30 tablet  3  . hydrochlorothiazide (MICROZIDE) 12.5 MG capsule TAKE (1) CAPSULE BY MOUTH ONCE DAILY.  30 capsule  6  . lisinopril (PRINIVIL,ZESTRIL) 20 MG tablet TAKE ONE TABLET DAILY.  30 tablet  6  . oxyCODONE-acetaminophen (PERCOCET) 10-325 MG per tablet Take 1 tablet by mouth every 6 (six) hours as needed for pain.      . metFORMIN (GLUCOPHAGE) 500 MG tablet Take 1 tablet (500 mg total) by mouth 2 (two) times daily with a meal.  60 tablet  3   No current facility-administered medications for this visit.    Discontinued Meds:    Medications Discontinued During This Encounter  Medication Reason  . HYDROcodone-acetaminophen (LORTAB) 10-500 MG per tablet Error    Patient Active Problem  List   Diagnosis Date Noted  . History of diagnostic tests 09/06/2012  . TIA (transient ischemic attack)   . Cardiomyopathy, nonischemic 02/24/2010  . AUTOMATIC IMPLANTABLE CARDIAC DEFIBRILLATOR SITU 02/24/2010  . DIABETES MELLITUS, TYPE II 06/27/2009  . GOUT 06/27/2009  . Tobacco abuse, in remission 06/27/2009  . TRANSAMINASES, SERUM, ELEVATED 06/27/2009  . POLYCYTHEMIA 12/24/2008  . COLONIC POLYPS 10/31/2008  . HEARING IMPAIRMENT 10/31/2008  . GERD (gastroesophageal reflux disease) 10/31/2008    LABS    Component Value Date/Time   NA 143 09/05/2012 1224   NA 136 03/27/2012 0544   NA 135 03/27/2012 0131   K 3.8 09/05/2012 1224   K 4.2 03/27/2012 0544   K 4.0 03/27/2012 0131   CL 100 09/05/2012 1224   CL 99 03/27/2012 0544   CL 94* 03/27/2012 0131   CO2 31 09/05/2012 1224   CO2 30 03/27/2012 0131   CO2 24 03/10/2012 0505   GLUCOSE 150* 09/05/2012 1224   GLUCOSE 144* 03/27/2012 0544   GLUCOSE 132* 03/27/2012 0131   BUN 7 09/05/2012 1224   BUN 24* 03/27/2012 0544   BUN 23 03/27/2012 0131   CREATININE 1.14 09/05/2012 1224   CREATININE 1.40* 03/27/2012 0544   CREATININE  1.46* 03/27/2012 0131   CREATININE 1.25 03/10/2012 0505   CALCIUM 9.8 09/05/2012 1224   CALCIUM 10.2 03/27/2012 0131   CALCIUM 9.5 03/10/2012 0505   GFRNONAA 54* 03/27/2012 0131   GFRNONAA 65* 03/10/2012 0505   GFRNONAA 65* 03/09/2012 0455   GFRAA 62* 03/27/2012 0131   GFRAA 75* 03/10/2012 0505   GFRAA 75* 03/09/2012 0455   CMP     Component Value Date/Time   NA 143 09/05/2012 1224   K 3.8 09/05/2012 1224   CL 100 09/05/2012 1224   CO2 31 09/05/2012 1224   GLUCOSE 150* 09/05/2012 1224   BUN 7 09/05/2012 1224   CREATININE 1.14 09/05/2012 1224   CREATININE 1.40* 03/27/2012 0544   CALCIUM 9.8 09/05/2012 1224   PROT 6.5 09/05/2012 1224   ALBUMIN 4.8 09/05/2012 1224   AST 23 09/05/2012 1224   ALT 31 09/05/2012 1224   ALKPHOS 90 09/05/2012 1224   BILITOT 0.8 09/05/2012 1224   GFRNONAA 54* 03/27/2012 0131   GFRAA 62* 03/27/2012 0131        Component Value Date/Time   WBC 8.7 03/27/2012 0131   WBC 6.8 03/10/2012 0505   WBC 5.8 03/07/2012 1332   HGB 15.6 03/27/2012 0544   HGB 16.8 03/27/2012 0131   HGB 16.0 03/10/2012 0505   HCT 46.0 03/27/2012 0544   HCT 45.4 03/27/2012 0131   HCT 44.0 03/10/2012 0505   MCV 88.8 03/27/2012 0131   MCV 89.8 03/10/2012 0505   MCV 84.9 03/07/2012 1332    Lipid Panel     Component Value Date/Time   CHOL 139 03/10/2012 0505   TRIG 491* 03/10/2012 0505   HDL 22* 03/10/2012 0505   CHOLHDL 6.3 03/10/2012 0505   VLDL UNABLE TO CALCULATE IF TRIGLYCERIDE OVER 400 mg/dL 08/14/1094 0454   LDLCALC UNABLE TO CALCULATE IF TRIGLYCERIDE OVER 400 mg/dL 0/04/8118 1478    ABG    Component Value Date/Time   PHART 7.437 04/07/2011 1330   PCO2ART 36.3 04/07/2011 1330   PO2ART 89.6 04/07/2011 1330   HCO3 24.1* 04/07/2011 1330   TCO2 24 03/27/2012 0544   O2SAT 96.1 04/07/2011 1330     Lab Results  Component Value Date   TSH 0.579 03/07/2012   BNP (last 3 results)  Recent Labs  03/07/12 1333  PROBNP 99.3   Cardiac Panel (last 3 results) No results found for this basename: CKTOTAL, CKMB, TROPONINI, RELINDX,  in the last 72 hours  Iron/TIBC/Ferritin    Component Value Date/Time   IRON 132 08/22/2008 2039   TIBC 316 08/22/2008 2039   FERRITIN 233 08/22/2008 2039     EKG Orders placed in visit on 12/26/12  . EKG 12-LEAD     Prior Assessment and Plan Problem List as of 12/26/2012   COLONIC POLYPS   DIABETES MELLITUS, TYPE II   GOUT   POLYCYTHEMIA   Tobacco abuse, in remission   Last Assessment & Plan   03/17/2011 Office Visit Written 03/17/2011 12:27 PM by Marinus Maw, MD     I continue to encourage him to stop smoking. He will try to reduce his tobacco use.    HEARING IMPAIRMENT   Cardiomyopathy, nonischemic   Last Assessment & Plan   09/05/2012 Office Visit Written 09/05/2012  3:57 PM by Kathlen Brunswick, MD     Patient is doing well symptomatically and by assessment of ejection fraction. Digoxin is probably  providing little, if any, benefit and will be discontinued.    GERD (gastroesophageal reflux disease)  TRANSAMINASES, SERUM, ELEVATED   Last Assessment & Plan   09/05/2012 Office Visit Written 09/05/2012  3:59 PM by Kathlen Brunswick, MD     LFT abnormalities have not been noted for the past few years and have apparently resolved.    AUTOMATIC IMPLANTABLE CARDIAC DEFIBRILLATOR SITU   Last Assessment & Plan   09/05/2012 Office Visit Edited 09/08/2012 10:38 AM by Kathlen Brunswick, MD     Ventricular tachycardia/fibrillation is unlikely with recovery of left ventricular systolic function; however, patient develops a fairly pronounced sinus tachycardia at times. Dose of carvedilol will be increased in an attempt to prevent inappropriate AICD discharge    TIA (transient ischemic attack)   Last Assessment & Plan   09/05/2012 Office Visit Written 09/05/2012  3:50 PM by Kathlen Brunswick, MD     Patient presented with clear focal left-sided weakness but a question of a preceding seizure and no evidence for cerebrovascular disease or other vascular cause. Lifelong therapy with clopidogrel is probably not necessary; a one-year course would be reasonable.    History of diagnostic tests       Imaging: No results found.

## 2012-12-27 NOTE — Assessment & Plan Note (Signed)
Pain has been prolonged and is atypical in terms of its persistence; however, the quality is consistent with myocardial ischemia as are the associated symptoms in the left arm.  A negative EKG is reassuring as is a history of normal coronary angiography in 2003 and again in 2009. I doubt that his current symptoms reflect coronary disease and will treat him for pain of chest wall origin.  A PPI will be restarted, both to provide gastroesophageal protection in the face of treatment with a nonsteroidal and for the possibility that his current symptoms reflect GERD.

## 2013-01-03 ENCOUNTER — Encounter: Payer: Self-pay | Admitting: *Deleted

## 2013-01-03 ENCOUNTER — Encounter: Payer: Self-pay | Admitting: Cardiology

## 2013-01-03 DIAGNOSIS — N183 Chronic kidney disease, stage 3 (moderate): Secondary | ICD-10-CM

## 2013-01-03 LAB — COMPREHENSIVE METABOLIC PANEL
Albumin: 4.5 g/dL (ref 3.5–5.2)
Alkaline Phosphatase: 83 U/L (ref 39–117)
BUN: 15 mg/dL (ref 6–23)
Creat: 1.37 mg/dL — ABNORMAL HIGH (ref 0.50–1.35)
Glucose, Bld: 79 mg/dL (ref 70–99)
Potassium: 4.7 mEq/L (ref 3.5–5.3)
Total Bilirubin: 1 mg/dL (ref 0.3–1.2)

## 2013-01-08 DIAGNOSIS — E1129 Type 2 diabetes mellitus with other diabetic kidney complication: Secondary | ICD-10-CM | POA: Diagnosis not present

## 2013-01-08 DIAGNOSIS — G8929 Other chronic pain: Secondary | ICD-10-CM | POA: Diagnosis not present

## 2013-01-08 DIAGNOSIS — I2510 Atherosclerotic heart disease of native coronary artery without angina pectoris: Secondary | ICD-10-CM | POA: Diagnosis not present

## 2013-01-08 DIAGNOSIS — Z6830 Body mass index (BMI) 30.0-30.9, adult: Secondary | ICD-10-CM | POA: Diagnosis not present

## 2013-01-08 NOTE — Progress Notes (Signed)
HPI: [name] is a 52 year old patient of Dr. Molly Floyd 4 we are following for ongoing assessment and management of nonischemic cardiomyopathy and subsequent recovery of left ventricular systolic function. Patient had a catheterization 2009 in a straight normal coronary anatomy. The patient was seen by Dr. Dietrich Floyd on 12/27/2012 with complaints of 3-4 days of constant anterior chest pressure with numbness and pressure in his left arm. The pain appeared to be noncardiac in origin, and PPI was restarted. No further cardiac testing was planned.    He states that the addition of PPI did not help with chest pressure. GERD symptoms are better.  He remains tired all of the time.  No Known Allergies  Current Outpatient Prescriptions  Medication Sig Dispense Refill  . allopurinol (ZYLOPRIM) 300 MG tablet Take 300 mg by mouth daily.       Marland Kitchen CARAFATE 1 G tablet TAKE (1) TABLET BY MOUTH (4) TIMES DAILY. TAKE 30 MINUTES BEFORE MEALS AND BEDTIME.  120 tablet  4  . carvedilol (COREG) 25 MG tablet Take 2 tablets (50 mg total) by mouth 2 (two) times daily with a meal.  120 tablet  6  . clopidogrel (PLAVIX) 75 MG tablet Take 1 tablet (75 mg total) by mouth daily with breakfast.  30 tablet  3  . hydrochlorothiazide (MICROZIDE) 12.5 MG capsule TAKE (1) CAPSULE BY MOUTH ONCE DAILY.  30 capsule  6  . lisinopril (PRINIVIL,ZESTRIL) 20 MG tablet TAKE ONE TABLET DAILY.  30 tablet  6  . metFORMIN (GLUCOPHAGE) 500 MG tablet Take 1 tablet (500 mg total) by mouth 2 (two) times daily with a meal.  60 tablet  3  . naproxen (NAPROSYN) 375 MG tablet Take 1 tablet (375 mg total) by mouth 3 (three) times daily with meals.  21 tablet  0  . omeprazole (PRILOSEC) 40 MG capsule Take 1 capsule (40 mg total) by mouth daily.  30 capsule  6  . oxyCODONE-acetaminophen (PERCOCET) 10-325 MG per tablet Take 1 tablet by mouth every 6 (six) hours as needed for pain.       No current facility-administered medications for this visit.    Past  Medical History  Diagnosis Date  . Diabetes mellitus DX: 2010  . Gout   . Hypertension   . CKD (chronic kidney disease), stage II   . Erosive esophagitis     a. per EGD (08/2011), Dr. Karilyn Cota - Erosive reflux esophagitis improved but not completely healed since previous EGD 3 years ago. Bx showing  ulcerated gatroesophageal junction mucosa. negative for H. pylori  . Nonischemic dilated cardiomyopathy     a. H/O EF as low as 35-40% by LV gram 10/2007;  b. Echo 02/2011 EF 50-55%, inf HK, Gr 1 DD   . DDD (degenerative disc disease), lumbar   . Hearing deficit     a. wear bilateral hearing aides  . AICD (automatic cardioverter/defibrillator) present 11/2007    a. 11/2007 [facility] Current VR - single lead ICD  . Chest pain     a. 10/2007 Cath:  normal Cors.  . WPW (Wolff-Parkinson-White syndrome)     a. s/p RFCA @ [facility] - 1999  . TIA (transient ischemic attack)     July, 2013  . Tobacco abuse, in remission 06/27/2009    Discontinued in 2009      Past Surgical History  Procedure Laterality Date  . Back surgery      1995  . Appendectomy    . Cardiac defibrillator placement    .  Cholecystectomy    . Radiofrequency ablation  1999    WPW; performed at [facility]  . Elbow surgery  06/2010  . Cholecystectomy, laparoscopic      11/2007  . Esophagogastroduodenoscopy  03/31/2012    also 08/2011; [name]  . Colonoscopy w/ polypectomy  2009    EAV:WUJWJX of systems complete and found to be negative unless listed above  PHYSICAL EXAM BP 110/70  Pulse 95  Ht 6\' 1"  (1.854 m)  Wt 236 lb (107.049 kg)  BMI 31.14 kg/m2  General: Well developed, well nourished, in no acute distress Head: Eyes PERRLA, No xanthomas.   Normal cephalic and atramatic  Lungs: Clear bilaterally to auscultation and percussion. Heart: HRRR S1 S2, tachycardic without MRG.  Pulses are 2+ & equal.            No carotid bruit. No JVD.  No abdominal bruits. No femoral bruits. Abdomen: Bowel sounds are positive, abdomen soft and  non-tender without masses or                  Hernia's noted. Msk:  Back normal, normal gait. Normal strength and tone for age. Extremities: No clubbing, cyanosis or edema.  DP +1 Neuro: Alert and oriented X 3. Psych:  Good affect, responds appropriately    ASSESSMENT AND PLAN

## 2013-01-09 ENCOUNTER — Encounter: Payer: Self-pay | Admitting: Adult Health

## 2013-01-09 ENCOUNTER — Ambulatory Visit (INDEPENDENT_AMBULATORY_CARE_PROVIDER_SITE_OTHER): Payer: Medicare Other | Admitting: Adult Health

## 2013-01-09 VITALS — BP 110/70 | HR 95 | Ht 73.0 in | Wt 236.0 lb

## 2013-01-09 DIAGNOSIS — G4733 Obstructive sleep apnea (adult) (pediatric): Secondary | ICD-10-CM | POA: Diagnosis not present

## 2013-01-09 DIAGNOSIS — Z95810 Presence of automatic (implantable) cardiac defibrillator: Secondary | ICD-10-CM | POA: Diagnosis not present

## 2013-01-09 DIAGNOSIS — I428 Other cardiomyopathies: Secondary | ICD-10-CM

## 2013-01-09 NOTE — Patient Instructions (Addendum)
Your physician recommends that you schedule a follow-up appointment in: 3 months with [facility]  Your physician has requested that you regularly monitor and record your blood pressure readings at home. Please use the same machine at the same time of day to check your readings and record them to bring to your follow-up visit.  Your physician recommends THAT YOU CONTACT [facility] TO HAVE A NEW PRESCRIPTION CALLED IN FOR YOUR C-PAP [phone]

## 2013-01-09 NOTE — Assessment & Plan Note (Signed)
His supine chest pressure is probably related to OSA and obstructive sleep pattern I have requested that he try to wear CPAP again. He will need to see Dr.; Sherwood Gambler for Rx. There are many options for CPAP. I have asked him to try to wear this to help with chest pressure and overall fatigue. Will need to watch his BP should he begin wearing this consistently as BP will soften. May need to titrate his medications lower. His wife will take his BP daily and call if his BP is too low, given parameters of < 95 mmHG.  Follow up of BP in 3 months.

## 2013-01-09 NOTE — Progress Notes (Deleted)
Name: DAYRON ODLAND    DOB: 12-Apr-1961  Age: 52 y.o.  MR#: [id number]       PCP:  Cassell Smiles., MD      Insurance: Payor: MEDICARE / Plan: MEDICARE PART B / Product Type: *No Product type* /   CC:    Chief Complaint  Patient presents with  . Cardiomyopathy    NICM  . Hypertension    VS Filed Vitals:   01/09/13 1446  BP: 110/70  Pulse: 95  Height: 6\' 1"  (1.854 m)  Weight: 236 lb (107.049 kg)    Weights Current Weight  01/09/13 236 lb (107.049 kg)  12/26/12 234 lb (106.142 kg)  09/05/12 230 lb 6.4 oz (104.509 kg)    Blood Pressure  BP Readings from Last 3 Encounters:  01/09/13 110/70  12/26/12 100/60  09/05/12 136/82     Admit date:  (Not on file) Last encounter with [facility]:  08/21/2012   Allergy Review of patient's allergies indicates no known allergies.  Current Outpatient Prescriptions  Medication Sig Dispense Refill  . allopurinol (ZYLOPRIM) 300 MG tablet Take 300 mg by mouth daily.       Marland Kitchen CARAFATE 1 G tablet TAKE (1) TABLET BY MOUTH (4) TIMES DAILY. TAKE 30 MINUTES BEFORE MEALS AND BEDTIME.  120 tablet  4  . carvedilol (COREG) 25 MG tablet Take 2 tablets (50 mg total) by mouth 2 (two) times daily with a meal.  120 tablet  6  . clopidogrel (PLAVIX) 75 MG tablet Take 1 tablet (75 mg total) by mouth daily with breakfast.  30 tablet  3  . hydrochlorothiazide (MICROZIDE) 12.5 MG capsule TAKE (1) CAPSULE BY MOUTH ONCE DAILY.  30 capsule  6  . lisinopril (PRINIVIL,ZESTRIL) 20 MG tablet TAKE ONE TABLET DAILY.  30 tablet  6  . metFORMIN (GLUCOPHAGE) 500 MG tablet Take 1 tablet (500 mg total) by mouth 2 (two) times daily with a meal.  60 tablet  3  . naproxen (NAPROSYN) 375 MG tablet Take 1 tablet (375 mg total) by mouth 3 (three) times daily with meals.  21 tablet  0  . omeprazole (PRILOSEC) 40 MG capsule Take 1 capsule (40 mg total) by mouth daily.  30 capsule  6  . oxyCODONE-acetaminophen (PERCOCET) 10-325 MG per tablet Take 1 tablet by mouth every 6 (six) hours as  needed for pain.       No current facility-administered medications for this visit.    Discontinued Meds:   There are no discontinued medications.  Patient Active Problem List   Diagnosis Date Noted  . Chronic kidney disease, stage 3 01/03/2013  . Chest pain 12/26/2012  . History of diagnostic tests 09/06/2012  . TIA (transient ischemic attack)   . Cardiomyopathy, nonischemic 02/24/2010  . AICD (automatic cardioverter/defibrillator) 02/24/2010  . DIABETES MELLITUS, TYPE II 06/27/2009  . Gout 06/27/2009  . Tobacco abuse, in remission 06/27/2009  . COLONIC POLYPS 10/31/2008  . GERD (gastroesophageal reflux disease) 10/31/2008    LABS    Component Value Date/Time   NA 137 01/02/2013 0150   NA 143 09/05/2012 1224   NA 136 03/27/2012 0544   K 4.7 01/02/2013 0150   K 3.8 09/05/2012 1224   K 4.2 03/27/2012 0544   CL 100 01/02/2013 0150   CL 100 09/05/2012 1224   CL 99 03/27/2012 0544   CO2 29 01/02/2013 0150   CO2 31 09/05/2012 1224   CO2 30 03/27/2012 0131   GLUCOSE 79 01/02/2013 0150   GLUCOSE 150*  09/05/2012 1224   GLUCOSE 144* 03/27/2012 0544   BUN 15 01/02/2013 0150   BUN 7 09/05/2012 1224   BUN 24* 03/27/2012 0544   CREATININE 1.37* 01/02/2013 0150   CREATININE 1.14 09/05/2012 1224   CREATININE 1.40* 03/27/2012 0544   CREATININE 1.46* 03/27/2012 0131   CREATININE 1.25 03/10/2012 0505   CALCIUM 9.9 01/02/2013 0150   CALCIUM 9.8 09/05/2012 1224   CALCIUM 10.2 03/27/2012 0131   GFRNONAA 54* 03/27/2012 0131   GFRNONAA 65* 03/10/2012 0505   GFRNONAA 65* 03/09/2012 0455   GFRAA 62* 03/27/2012 0131   GFRAA 75* 03/10/2012 0505   GFRAA 75* 03/09/2012 0455   CMP     Component Value Date/Time   NA 137 01/02/2013 0150   K 4.7 01/02/2013 0150   CL 100 01/02/2013 0150   CO2 29 01/02/2013 0150   GLUCOSE 79 01/02/2013 0150   BUN 15 01/02/2013 0150   CREATININE 1.37* 01/02/2013 0150   CREATININE 1.40* 03/27/2012 0544   CALCIUM 9.9 01/02/2013 0150   PROT 6.4 01/02/2013 0150   ALBUMIN 4.5 01/02/2013 0150   AST  15 01/02/2013 0150   ALT 19 01/02/2013 0150   ALKPHOS 83 01/02/2013 0150   BILITOT 1.0 01/02/2013 0150   GFRNONAA 54* 03/27/2012 0131   GFRAA 62* 03/27/2012 0131       Component Value Date/Time   WBC 8.7 03/27/2012 0131   WBC 6.8 03/10/2012 0505   WBC 5.8 03/07/2012 1332   HGB 15.6 03/27/2012 0544   HGB 16.8 03/27/2012 0131   HGB 16.0 03/10/2012 0505   HCT 46.0 03/27/2012 0544   HCT 45.4 03/27/2012 0131   HCT 44.0 03/10/2012 0505   MCV 88.8 03/27/2012 0131   MCV 89.8 03/10/2012 0505   MCV 84.9 03/07/2012 1332    Lipid Panel     Component Value Date/Time   CHOL 139 03/10/2012 0505   TRIG 491* 03/10/2012 0505   HDL 22* 03/10/2012 0505   CHOLHDL 6.3 03/10/2012 0505   VLDL UNABLE TO CALCULATE IF TRIGLYCERIDE OVER 400 mg/dL 08/09/9145 8295   LDLCALC UNABLE TO CALCULATE IF TRIGLYCERIDE OVER 400 mg/dL 01/09/1307 6578    ABG    Component Value Date/Time   PHART 7.437 04/07/2011 1330   PCO2ART 36.3 04/07/2011 1330   PO2ART 89.6 04/07/2011 1330   HCO3 24.1* 04/07/2011 1330   TCO2 24 03/27/2012 0544   O2SAT 96.1 04/07/2011 1330     Lab Results  Component Value Date   TSH 0.579 03/07/2012   BNP (last 3 results)  Recent Labs  03/07/12 1333  PROBNP 99.3   Cardiac Panel (last 3 results) No results found for this basename: CKTOTAL, CKMB, TROPONINI, RELINDX,  in the last 72 hours  Iron/TIBC/Ferritin    Component Value Date/Time   IRON 132 08/22/2008 2039   TIBC 316 08/22/2008 2039   FERRITIN 233 08/22/2008 2039     EKG Orders placed in visit on 01/09/13  . EKG 12-LEAD     Prior Assessment and Plan Problem List as of 01/09/2013   COLONIC POLYPS   DIABETES MELLITUS, TYPE II   Gout   Tobacco abuse, in remission   Last Assessment & Plan   03/17/2011 Office Visit Written 03/17/2011 12:27 PM by Marinus Maw, MD     I continue to encourage him to stop smoking. He will try to reduce his tobacco use.    Cardiomyopathy, nonischemic   Last Assessment & Plan   12/26/2012 Office Visit Written 12/26/2012  4:19 PM  by Kathlen Brunswick, MD     No evidence for cardiac decompensation by exam. Echocardiogram less than one year ago revealed a normal ejection fraction.  Cardiac imaging will not be repeated at present.    GERD (gastroesophageal reflux disease)   AICD (automatic cardioverter/defibrillator)   Last Assessment & Plan   09/05/2012 Office Visit Edited 09/08/2012 10:38 AM by Kathlen Brunswick, MD     Ventricular tachycardia/fibrillation is unlikely with recovery of left ventricular systolic function; however, patient develops a fairly pronounced sinus tachycardia at times. Dose of carvedilol will be increased in an attempt to prevent inappropriate AICD discharge    TIA (transient ischemic attack)   Last Assessment & Plan   09/05/2012 Office Visit Written 09/05/2012  3:50 PM by Kathlen Brunswick, MD     Patient presented with clear focal left-sided weakness but a question of a preceding seizure and no evidence for cerebrovascular disease or other vascular cause. Lifelong therapy with clopidogrel is probably not necessary; a one-year course would be reasonable.    History of diagnostic tests   Chest pain   Last Assessment & Plan   12/26/2012 Office Visit Written 12/27/2012 10:36 PM by Kathlen Brunswick, MD     Pain has been prolonged and is atypical in terms of its persistence; however, the quality is consistent with myocardial ischemia as are the associated symptoms in the left arm.  A negative EKG is reassuring as is a history of normal coronary angiography in 2003 and again in 2009. I doubt that his current symptoms reflect coronary disease and will treat him for pain of chest wall origin.  A PPI will be restarted, both to provide gastroesophageal protection in the face of treatment with a nonsteroidal and for the possibility that his current symptoms reflect GERD.     Chronic kidney disease, stage 3       Imaging: No results found.

## 2013-01-09 NOTE — Assessment & Plan Note (Signed)
No evidence of fluid overload on this visit. He is slightly tachycardic. Continue BB and ACE inhibitor. Consider adding digoxin if he continues tachycardic on pacer evaluation.

## 2013-01-09 NOTE — Assessment & Plan Note (Signed)
He has not had his ICD checked since hospitalization over one year ago. Will have placed on the schedule to be seen and have this interrogated, with follow up planned at each visit.

## 2013-01-11 ENCOUNTER — Ambulatory Visit: Payer: BC Managed Care – PPO | Admitting: Cardiology

## 2013-01-22 ENCOUNTER — Ambulatory Visit (INDEPENDENT_AMBULATORY_CARE_PROVIDER_SITE_OTHER): Payer: Medicare Other | Admitting: *Deleted

## 2013-01-22 DIAGNOSIS — I428 Other cardiomyopathies: Secondary | ICD-10-CM

## 2013-01-22 LAB — ICD DEVICE OBSERVATION
DEVICE MODEL ICD: 536141
FVT: 0
PACEART VT: 0
RV LEAD AMPLITUDE: 8.5 mv
RV LEAD IMPEDENCE ICD: 362.5 Ohm
TOT-0007: 4
TOT-0008: 0
TZAT-0001SLOWVT: 1
TZAT-0004FASTVT: 8
TZAT-0004SLOWVT: 8
TZAT-0012FASTVT: 200 ms
TZAT-0012SLOWVT: 200 ms
TZAT-0013FASTVT: 1
TZAT-0018FASTVT: NEGATIVE
TZAT-0018SLOWVT: NEGATIVE
TZAT-0019SLOWVT: 7.5 V
TZON-0003FASTVT: 280 ms
TZON-0003SLOWVT: 315 ms
TZON-0004SLOWVT: 30
TZST-0001FASTVT: 3
TZST-0001SLOWVT: 3
TZST-0001SLOWVT: 5
TZST-0003FASTVT: 36 J
TZST-0003FASTVT: 36 J
TZST-0003SLOWVT: 30 J
TZST-0003SLOWVT: 36 J

## 2013-01-22 NOTE — Progress Notes (Signed)
ICD check in office. 

## 2013-02-01 DIAGNOSIS — I2510 Atherosclerotic heart disease of native coronary artery without angina pectoris: Secondary | ICD-10-CM | POA: Diagnosis not present

## 2013-02-01 DIAGNOSIS — Z6831 Body mass index (BMI) 31.0-31.9, adult: Secondary | ICD-10-CM | POA: Diagnosis not present

## 2013-02-01 DIAGNOSIS — I10 Essential (primary) hypertension: Secondary | ICD-10-CM | POA: Diagnosis not present

## 2013-02-01 DIAGNOSIS — N182 Chronic kidney disease, stage 2 (mild): Secondary | ICD-10-CM | POA: Diagnosis not present

## 2013-02-01 DIAGNOSIS — G8929 Other chronic pain: Secondary | ICD-10-CM | POA: Diagnosis not present

## 2013-02-12 ENCOUNTER — Other Ambulatory Visit: Payer: Self-pay | Admitting: Adult Health

## 2013-03-03 ENCOUNTER — Other Ambulatory Visit (INDEPENDENT_AMBULATORY_CARE_PROVIDER_SITE_OTHER): Payer: Self-pay | Admitting: Internal Medicine

## 2013-03-07 ENCOUNTER — Emergency Department (HOSPITAL_COMMUNITY): Payer: Medicare Other

## 2013-03-07 ENCOUNTER — Encounter (HOSPITAL_COMMUNITY): Payer: Self-pay | Admitting: Emergency Medicine

## 2013-03-07 ENCOUNTER — Emergency Department (HOSPITAL_COMMUNITY)
Admission: EM | Admit: 2013-03-07 | Discharge: 2013-03-07 | Disposition: A | Discharge status: Home / Self Care | Payer: Medicare Other | Attending: Emergency Medicine | Admitting: Emergency Medicine

## 2013-03-07 DIAGNOSIS — N433 Hydrocele, unspecified: Secondary | ICD-10-CM

## 2013-03-07 DIAGNOSIS — Z8719 Personal history of other diseases of the digestive system: Secondary | ICD-10-CM | POA: Insufficient documentation

## 2013-03-07 DIAGNOSIS — E119 Type 2 diabetes mellitus without complications: Secondary | ICD-10-CM | POA: Insufficient documentation

## 2013-03-07 DIAGNOSIS — Z8679 Personal history of other diseases of the circulatory system: Secondary | ICD-10-CM | POA: Diagnosis not present

## 2013-03-07 DIAGNOSIS — Z8739 Personal history of other diseases of the musculoskeletal system and connective tissue: Secondary | ICD-10-CM | POA: Diagnosis not present

## 2013-03-07 DIAGNOSIS — N5089 Other specified disorders of the male genital organs: Secondary | ICD-10-CM | POA: Diagnosis not present

## 2013-03-07 DIAGNOSIS — Z95810 Presence of automatic (implantable) cardiac defibrillator: Secondary | ICD-10-CM | POA: Insufficient documentation

## 2013-03-07 DIAGNOSIS — Z87891 Personal history of nicotine dependence: Secondary | ICD-10-CM | POA: Diagnosis not present

## 2013-03-07 DIAGNOSIS — R29818 Other symptoms and signs involving the nervous system: Secondary | ICD-10-CM | POA: Diagnosis not present

## 2013-03-07 DIAGNOSIS — L210 Seborrhea capitis: Secondary | ICD-10-CM | POA: Diagnosis not present

## 2013-03-07 DIAGNOSIS — N509 Disorder of male genital organs, unspecified: Secondary | ICD-10-CM | POA: Diagnosis not present

## 2013-03-07 DIAGNOSIS — M109 Gout, unspecified: Secondary | ICD-10-CM | POA: Insufficient documentation

## 2013-03-07 DIAGNOSIS — Z982 Presence of cerebrospinal fluid drainage device: Secondary | ICD-10-CM | POA: Insufficient documentation

## 2013-03-07 DIAGNOSIS — Z6831 Body mass index (BMI) 31.0-31.9, adult: Secondary | ICD-10-CM | POA: Diagnosis not present

## 2013-03-07 DIAGNOSIS — I129 Hypertensive chronic kidney disease with stage 1 through stage 4 chronic kidney disease, or unspecified chronic kidney disease: Secondary | ICD-10-CM | POA: Diagnosis not present

## 2013-03-07 DIAGNOSIS — Z8669 Personal history of other diseases of the nervous system and sense organs: Secondary | ICD-10-CM | POA: Insufficient documentation

## 2013-03-07 DIAGNOSIS — N182 Chronic kidney disease, stage 2 (mild): Secondary | ICD-10-CM | POA: Insufficient documentation

## 2013-03-07 DIAGNOSIS — N4340 Spermatocele of epididymis, unspecified: Secondary | ICD-10-CM | POA: Diagnosis not present

## 2013-03-07 DIAGNOSIS — Z8673 Personal history of transient ischemic attack (TIA), and cerebral infarction without residual deficits: Secondary | ICD-10-CM | POA: Diagnosis not present

## 2013-03-07 DIAGNOSIS — Z79899 Other long term (current) drug therapy: Secondary | ICD-10-CM | POA: Diagnosis not present

## 2013-03-07 DIAGNOSIS — Z9889 Other specified postprocedural states: Secondary | ICD-10-CM | POA: Insufficient documentation

## 2013-03-07 DIAGNOSIS — I10 Essential (primary) hypertension: Secondary | ICD-10-CM | POA: Diagnosis not present

## 2013-03-07 DIAGNOSIS — I2510 Atherosclerotic heart disease of native coronary artery without angina pectoris: Secondary | ICD-10-CM | POA: Diagnosis not present

## 2013-03-07 DIAGNOSIS — K4090 Unilateral inguinal hernia, without obstruction or gangrene, not specified as recurrent: Secondary | ICD-10-CM | POA: Diagnosis not present

## 2013-03-07 LAB — CBC WITH DIFFERENTIAL/PLATELET
Basophils Absolute: 0.1 10*3/uL (ref 0.0–0.1)
Basophils Relative: 1 % (ref 0–1)
Eosinophils Absolute: 0.2 10*3/uL (ref 0.0–0.7)
MCH: 32.4 pg (ref 26.0–34.0)
MCHC: 35.9 g/dL (ref 30.0–36.0)
Monocytes Relative: 10 % (ref 3–12)
Neutro Abs: 3.9 10*3/uL (ref 1.7–7.7)
Neutrophils Relative %: 69 % (ref 43–77)
RDW: 13.9 % (ref 11.5–15.5)

## 2013-03-07 LAB — COMPREHENSIVE METABOLIC PANEL
ALT: 40 U/L (ref 0–53)
AST: 28 U/L (ref 0–37)
CO2: 27 mEq/L (ref 19–32)
Calcium: 10.2 mg/dL (ref 8.4–10.5)
Creatinine, Ser: 1.54 mg/dL — ABNORMAL HIGH (ref 0.50–1.35)
GFR calc Af Amer: 58 mL/min — ABNORMAL LOW (ref >90)
GFR calc non Af Amer: 50 mL/min — ABNORMAL LOW (ref >90)
Glucose, Bld: 115 mg/dL — ABNORMAL HIGH (ref 70–99)
Sodium: 134 mEq/L — ABNORMAL LOW (ref 135–145)
Total Protein: 7.1 g/dL (ref 6.0–8.3)

## 2013-03-07 LAB — PSA: PSA: 0.85 ng/mL (ref <=4.00)

## 2013-03-07 MED ORDER — HYDROMORPHONE HCL PF 1 MG/ML IJ SOLN
1.0000 mg | Freq: Once | INTRAMUSCULAR | Status: AC
  Administered 2013-03-07: 1 mg via INTRAVENOUS
  Filled 2013-03-07: qty 1

## 2013-03-07 MED ORDER — SODIUM CHLORIDE 0.9 % IV BOLUS (SEPSIS)
1000.0000 mL | Freq: Once | INTRAVENOUS | Status: AC
  Administered 2013-03-07: 1000 mL via INTRAVENOUS

## 2013-03-07 MED ORDER — HYDROCODONE-ACETAMINOPHEN 5-325 MG PO TABS: 1.0000 | ORAL_TABLET | Freq: Four times a day (QID) | ORAL | Status: DC | PRN

## 2013-03-07 MED ORDER — ONDANSETRON HCL 4 MG/2ML IJ SOLN
4.0000 mg | Freq: Once | INTRAMUSCULAR | Status: AC
  Administered 2013-03-07: 4 mg via INTRAVENOUS
  Filled 2013-03-07: qty 2

## 2013-03-07 MED ORDER — HYDROCODONE-ACETAMINOPHEN 5-325 MG PO TABS
1.0000 | ORAL_TABLET | Freq: Once | ORAL | Status: AC
  Administered 2013-03-07: 1 via ORAL
  Filled 2013-03-07: qty 1

## 2013-03-07 MED ORDER — ONDANSETRON 8 MG PO TBDP: 8.0000 mg | ORAL_TABLET | Freq: Three times a day (TID) | ORAL | Status: DC | PRN

## 2013-03-07 MED ORDER — IBUPROFEN 400 MG PO TABS: 400.0000 mg | ORAL_TABLET | Freq: Four times a day (QID) | ORAL | Status: DC | PRN

## 2013-03-07 NOTE — ED Provider Notes (Signed)
CSN: [id number]     Arrival date & time 03/07/13  1135 History    This chart was scribed for Derwood Kaplan, MD by Leone Payor, ED Scribe. This patient was seen in room APA03/AP[facility]03 and the patient's care was started 12:15 PM.   First MD Initiated Contact with Patient 03/07/13 1200     Chief Complaint  Patient presents with  . Prostate Check    The history is provided by the patient. No language interpreter was used.    HPI Comments: Mike Floyd is a 52 y.o. male who presents to the Emergency Department requesting a prostate check. Pt was seen by Dr. Sherwood Gambler today and was sent here for burning and swelling to the genital area that has been going on for 2 weeks but has worsened significantly in the last 2-3 days. Pt is also complaining of swelling to both testicles. Pt had similar pain in December 2013 but states it resolved on its own. Denies dysuria, hematuria, penile pain or discharge, nausea, vomiting, fever, chills. Denies any recent infections.   Past Medical History  Diagnosis Date  . Diabetes mellitus DX: 2010  . Gout   . Hypertension   . CKD (chronic kidney disease), stage II   . Erosive esophagitis     a. per EGD (08/2011), Dr. Karilyn Cota - Erosive reflux esophagitis improved but not completely healed since previous EGD 3 years ago. Bx showing  ulcerated gatroesophageal junction mucosa. negative for H. pylori  . Nonischemic dilated cardiomyopathy     a. H/O EF as low as 35-40% by LV gram 10/2007;  b. Echo 02/2011 EF 50-55%, inf HK, Gr 1 DD   . DDD (degenerative disc disease), lumbar   . Hearing deficit     a. wear bilateral hearing aides  . AICD (automatic cardioverter/defibrillator) present 11/2007    a. 11/2007 [facility] Current VR - single lead ICD  . Chest pain     a. 10/2007 Cath:  normal Cors.  . WPW (Wolff-Parkinson-White syndrome)     a. s/p RFCA @ [facility] - 1999  . TIA (transient ischemic attack)     July, 2013  . Tobacco abuse, in remission 06/27/2009    Discontinued in 2009      Past Surgical History  Procedure Laterality Date  . Back surgery      1995  . Appendectomy    . Cardiac defibrillator placement    . Cholecystectomy    . Radiofrequency ablation  1999    WPW; performed at [facility]  . Elbow surgery  06/2010  . Cholecystectomy, laparoscopic      11/2007  . Esophagogastroduodenoscopy  03/31/2012    also 08/2011; [name]  . Colonoscopy w/ polypectomy  2009   Family History  Problem Relation Age of Onset  . Heart attack Mother 49  . Heart attack Father 58  . Heart attack Brother 55  . Hypertension Mother   . Hypertension Father   . Hypertension Brother   . Stroke Maternal Grandmother 80  . Diabetes Mother   . Diabetes Father   . Diabetes Brother    History  Substance Use Topics  . Smoking status: Former Smoker -- 60 years    Types: Cigarettes    Quit date: 07/30/2010  . Smokeless tobacco: Never Used  . Alcohol Use: 0.6 oz/week    1 Cans of beer per week     Comment: used "years ago"    Review of Systems  Constitutional: Negative for fever and chills.  Gastrointestinal: Negative for nausea and vomiting.  Genitourinary: Positive for scrotal swelling and testicular pain. Negative for dysuria, hematuria, discharge and penile pain.  All other systems reviewed and are negative.    Allergies  Review of patient's allergies indicates no known allergies.  Home Medications   Current Outpatient Rx  Name  Route  Sig  Dispense  Refill  . allopurinol (ZYLOPRIM) 300 MG tablet   Oral   Take 300 mg by mouth daily.          . carvedilol (COREG) 25 MG tablet   Oral   Take 2 tablets (50 mg total) by mouth 2 (two) times daily with a meal.   120 tablet   6   . clopidogrel (PLAVIX) 75 MG tablet   Oral   Take 1 tablet (75 mg total) by mouth daily with breakfast.   30 tablet   3   . hydrochlorothiazide (MICROZIDE) 12.5 MG capsule   Oral   Take 12.5 mg by mouth daily.         Marland Kitchen lisinopril (PRINIVIL,ZESTRIL) 20 MG tablet   Oral    Take 20 mg by mouth daily.         . metFORMIN (GLUCOPHAGE) 500 MG tablet   Oral   Take 1 tablet (500 mg total) by mouth 2 (two) times daily with a meal.   60 tablet   3   . omeprazole (PRILOSEC) 40 MG capsule   Oral   Take 1 capsule (40 mg total) by mouth daily.   30 capsule   6   . oxyCODONE-acetaminophen (PERCOCET) 10-325 MG per tablet   Oral   Take 1 tablet by mouth every 6 (six) hours as needed for pain.         Marland Kitchen sucralfate (CARAFATE) 1 G tablet   Oral   Take 1 g by mouth 4 (four) times daily. Take 1 tablet by mouth 30 mins before meals and bedtime.          BP 113/75  Pulse 99  Temp(Src) 97.8 F (36.6 C) (Oral)  Resp 20  Ht 6\' 2"  (1.88 m)  Wt 242 lb (109.77 kg)  BMI 31.06 kg/m2  SpO2 98% Physical Exam  Nursing note and vitals reviewed. Constitutional: He is oriented to person, place, and time. He appears well-developed and well-nourished.  HENT:  Head: Normocephalic and atraumatic.  Eyes: Conjunctivae and EOM are normal. Pupils are equal, round, and reactive to light.  Neck: Normal range of motion. Neck supple.  Cardiovascular: Normal rate, regular rhythm and normal heart sounds.  Exam reveals no gallop and no friction rub.   No murmur heard. Pulmonary/Chest: Effort normal and breath sounds normal. No respiratory distress. He has no wheezes. He has no rales. He exhibits no tenderness.  Lungs clear to ausculation.   Abdominal: Soft. Bowel sounds are normal.  Genitourinary:  Right scrotal swelling with erythema. Significant tenderness to palpation. No hernia appreciated. Positive transillumination of light. Neg Prehn. Positive cremasteric sign.   Musculoskeletal: Normal range of motion.  Neurological: He is alert and oriented to person, place, and time.  Skin: Skin is warm and dry.  Psychiatric: He has a normal mood and affect.    ED Course   Procedures (including critical care time)  DIAGNOSTIC STUDIES: Oxygen Saturation is 98% on RA, normal by my  interpretation.    COORDINATION OF CARE: 12:21 PM Discussed treatment plan with pt at bedside and pt agreed to plan.   Labs Reviewed - No data  to display No results found. No diagnosis found.  MDM  I personally performed the services described in this documentation, which was scribed in my presence. The recorded information has been reviewed and is accurate.  Pt comes in with cc of right testicular pain. Seems like he had similar sx last year, resolved, and then quickly got worse over the last few day. No torsion per exam, and no signs of forniers. [place] ordered, and it shows hydrocele, and no hernias, no epididymitis and few cysts.  Will get Urology f/u. Pain control.   Derwood Kaplan, MD 03/07/13 1424

## 2013-03-07 NOTE — ED Notes (Signed)
Pt seen by dr Sherwood Gambler today and sent here for burning and swelling to genital area. X 2 weeks. States both testicles swollen. Denies penile swelling/discharge.

## 2013-03-08 DIAGNOSIS — E1149 Type 2 diabetes mellitus with other diabetic neurological complication: Secondary | ICD-10-CM | POA: Diagnosis not present

## 2013-03-08 DIAGNOSIS — M2040 Other hammer toe(s) (acquired), unspecified foot: Secondary | ICD-10-CM | POA: Diagnosis not present

## 2013-03-08 DIAGNOSIS — M25579 Pain in unspecified ankle and joints of unspecified foot: Secondary | ICD-10-CM | POA: Diagnosis not present

## 2013-03-08 DIAGNOSIS — E119 Type 2 diabetes mellitus without complications: Secondary | ICD-10-CM | POA: Diagnosis not present

## 2013-03-12 DIAGNOSIS — N453 Epididymo-orchitis: Secondary | ICD-10-CM | POA: Diagnosis not present

## 2013-03-15 ENCOUNTER — Encounter (HOSPITAL_COMMUNITY): Payer: Self-pay

## 2013-03-15 ENCOUNTER — Observation Stay (HOSPITAL_COMMUNITY)
Admission: EM | Admit: 2013-03-15 | Discharge: 2013-03-16 | Disposition: A | Discharge status: Home / Self Care | Payer: Medicare Other | Attending: Internal Medicine | Admitting: Internal Medicine

## 2013-03-15 ENCOUNTER — Emergency Department (HOSPITAL_COMMUNITY): Payer: Medicare Other

## 2013-03-15 DIAGNOSIS — N433 Hydrocele, unspecified: Secondary | ICD-10-CM | POA: Diagnosis present

## 2013-03-15 DIAGNOSIS — R079 Chest pain, unspecified: Secondary | ICD-10-CM | POA: Diagnosis not present

## 2013-03-15 DIAGNOSIS — R0789 Other chest pain: Secondary | ICD-10-CM | POA: Diagnosis not present

## 2013-03-15 DIAGNOSIS — N4340 Spermatocele of epididymis, unspecified: Secondary | ICD-10-CM | POA: Diagnosis not present

## 2013-03-15 DIAGNOSIS — N183 Chronic kidney disease, stage 3 unspecified: Secondary | ICD-10-CM | POA: Diagnosis not present

## 2013-03-15 DIAGNOSIS — R0989 Other specified symptoms and signs involving the circulatory and respiratory systems: Secondary | ICD-10-CM | POA: Insufficient documentation

## 2013-03-15 DIAGNOSIS — M109 Gout, unspecified: Secondary | ICD-10-CM

## 2013-03-15 DIAGNOSIS — J189 Pneumonia, unspecified organism: Secondary | ICD-10-CM | POA: Diagnosis not present

## 2013-03-15 DIAGNOSIS — G459 Transient cerebral ischemic attack, unspecified: Secondary | ICD-10-CM | POA: Diagnosis not present

## 2013-03-15 DIAGNOSIS — E119 Type 2 diabetes mellitus without complications: Secondary | ICD-10-CM | POA: Diagnosis not present

## 2013-03-15 DIAGNOSIS — Z9289 Personal history of other medical treatment: Secondary | ICD-10-CM

## 2013-03-15 DIAGNOSIS — I129 Hypertensive chronic kidney disease with stage 1 through stage 4 chronic kidney disease, or unspecified chronic kidney disease: Secondary | ICD-10-CM | POA: Insufficient documentation

## 2013-03-15 DIAGNOSIS — K219 Gastro-esophageal reflux disease without esophagitis: Secondary | ICD-10-CM | POA: Diagnosis not present

## 2013-03-15 DIAGNOSIS — D126 Benign neoplasm of colon, unspecified: Secondary | ICD-10-CM

## 2013-03-15 DIAGNOSIS — I428 Other cardiomyopathies: Secondary | ICD-10-CM | POA: Insufficient documentation

## 2013-03-15 DIAGNOSIS — I10 Essential (primary) hypertension: Secondary | ICD-10-CM | POA: Diagnosis not present

## 2013-03-15 DIAGNOSIS — R0609 Other forms of dyspnea: Secondary | ICD-10-CM | POA: Diagnosis not present

## 2013-03-15 DIAGNOSIS — R0602 Shortness of breath: Secondary | ICD-10-CM | POA: Diagnosis not present

## 2013-03-15 DIAGNOSIS — G4733 Obstructive sleep apnea (adult) (pediatric): Secondary | ICD-10-CM | POA: Diagnosis not present

## 2013-03-15 DIAGNOSIS — Z95810 Presence of automatic (implantable) cardiac defibrillator: Secondary | ICD-10-CM | POA: Diagnosis present

## 2013-03-15 DIAGNOSIS — E1159 Type 2 diabetes mellitus with other circulatory complications: Secondary | ICD-10-CM | POA: Diagnosis present

## 2013-03-15 DIAGNOSIS — F17201 Nicotine dependence, unspecified, in remission: Secondary | ICD-10-CM

## 2013-03-15 DIAGNOSIS — R0600 Dyspnea, unspecified: Secondary | ICD-10-CM

## 2013-03-15 LAB — TROPONIN I
Troponin I: <0.3 ng/mL (ref <0.30)
Troponin I: <0.3 ng/mL (ref <0.30)
Troponin I: <0.3 ng/mL (ref <0.30)

## 2013-03-15 LAB — CBC WITH DIFFERENTIAL/PLATELET
Hemoglobin: 16 g/dL (ref 13.0–17.0)
Lymphocytes Relative: 18 % (ref 12–46)
Lymphs Abs: 0.8 10*3/uL (ref 0.7–4.0)
MCH: 32.7 pg (ref 26.0–34.0)
Monocytes Relative: 9 % (ref 3–12)
Neutrophils Relative %: 71 % (ref 43–77)
Platelets: 102 10*3/uL — ABNORMAL LOW (ref 150–400)
RBC: 4.9 MIL/uL (ref 4.22–5.81)
Smear Review: DECREASED
WBC: 4.6 10*3/uL (ref 4.0–10.5)

## 2013-03-15 LAB — COMPREHENSIVE METABOLIC PANEL
ALT: 34 U/L (ref 0–53)
AST: 28 U/L (ref 0–37)
Albumin: 4.3 g/dL (ref 3.5–5.2)
Alkaline Phosphatase: 86 U/L (ref 39–117)
BUN: 14 mg/dL (ref 6–23)
Chloride: 98 mEq/L (ref 96–112)
Potassium: 4.5 mEq/L (ref 3.5–5.1)
Sodium: 134 mEq/L — ABNORMAL LOW (ref 135–145)
Total Bilirubin: 0.8 mg/dL (ref 0.3–1.2)
Total Protein: 6.7 g/dL (ref 6.0–8.3)

## 2013-03-15 LAB — GLUCOSE, CAPILLARY

## 2013-03-15 LAB — PRO B NATRIURETIC PEPTIDE: Pro B Natriuretic peptide (BNP): 33.1 pg/mL (ref 0–125)

## 2013-03-15 LAB — LIPID PANEL
Cholesterol: 151 mg/dL (ref 0–200)
Triglycerides: 446 mg/dL — ABNORMAL HIGH (ref <150)

## 2013-03-15 MED ORDER — CARVEDILOL 12.5 MG PO TABS
50.0000 mg | ORAL_TABLET | Freq: Two times a day (BID) | ORAL | Status: DC
Start: 2013-03-15 — End: 2013-03-16
  Administered 2013-03-15 – 2013-03-16 (×2): 50 mg via ORAL
  Filled 2013-03-15 (×2): qty 4

## 2013-03-15 MED ORDER — ONDANSETRON HCL 4 MG/2ML IJ SOLN: 4.0000 mg | Freq: Four times a day (QID) | INTRAMUSCULAR | Status: DC | PRN

## 2013-03-15 MED ORDER — OXYCODONE-ACETAMINOPHEN 5-325 MG PO TABS
1.0000 | ORAL_TABLET | Freq: Four times a day (QID) | ORAL | Status: DC | PRN
  Administered 2013-03-16: 1 via ORAL
  Filled 2013-03-15: qty 1

## 2013-03-15 MED ORDER — NITROGLYCERIN 0.4 MG SL SUBL
0.4000 mg | SUBLINGUAL_TABLET | SUBLINGUAL | Status: DC | PRN
  Filled 2013-03-15: qty 25

## 2013-03-15 MED ORDER — NITROGLYCERIN 0.4 MG SL SUBL
SUBLINGUAL_TABLET | SUBLINGUAL | Status: AC
  Administered 2013-03-15: 0.4 mg via SUBLINGUAL
  Filled 2013-03-15: qty 25

## 2013-03-15 MED ORDER — SODIUM CHLORIDE 0.9 % IJ SOLN
3.0000 mL | Freq: Two times a day (BID) | INTRAMUSCULAR | Status: DC
  Administered 2013-03-15 – 2013-03-16 (×2): 3 mL via INTRAVENOUS

## 2013-03-15 MED ORDER — ASPIRIN 81 MG PO CHEW: 324.0000 mg | CHEWABLE_TABLET | Freq: Once | ORAL | Status: AC

## 2013-03-15 MED ORDER — ALLOPURINOL 300 MG PO TABS
300.0000 mg | ORAL_TABLET | Freq: Every day | ORAL | Status: DC
  Administered 2013-03-16: 300 mg via ORAL
  Filled 2013-03-15 (×2): qty 1

## 2013-03-15 MED ORDER — IOHEXOL 350 MG/ML SOLN
80.0000 mL | Freq: Once | INTRAVENOUS | Status: AC | PRN
  Administered 2013-03-15: 80 mL via INTRAVENOUS

## 2013-03-15 MED ORDER — ALUM & MAG HYDROXIDE-SIMETH 200-200-20 MG/5ML PO SUSP: 30.0000 mL | Freq: Four times a day (QID) | ORAL | Status: DC | PRN

## 2013-03-15 MED ORDER — MORPHINE SULFATE 4 MG/ML IJ SOLN: 4.0000 mg | Freq: Once | INTRAMUSCULAR | Status: AC

## 2013-03-15 MED ORDER — ONDANSETRON HCL 4 MG PO TABS: 4.0000 mg | ORAL_TABLET | Freq: Four times a day (QID) | ORAL | Status: DC | PRN

## 2013-03-15 MED ORDER — ASPIRIN 81 MG PO CHEW
CHEWABLE_TABLET | ORAL | Status: AC
  Administered 2013-03-15: 324 mg via ORAL
  Filled 2013-03-15: qty 4

## 2013-03-15 MED ORDER — ACETAMINOPHEN 650 MG RE SUPP: 650.0000 mg | Freq: Four times a day (QID) | RECTAL | Status: DC | PRN

## 2013-03-15 MED ORDER — SUCRALFATE 1 G PO TABS
1.0000 g | ORAL_TABLET | Freq: Four times a day (QID) | ORAL | Status: DC
  Filled 2013-03-15: qty 1

## 2013-03-15 MED ORDER — INSULIN ASPART 100 UNIT/ML ~~LOC~~ SOLN
0.0000 [IU] | Freq: Three times a day (TID) | SUBCUTANEOUS | Status: DC
  Administered 2013-03-15: 2 [IU] via SUBCUTANEOUS

## 2013-03-15 MED ORDER — NITROGLYCERIN 0.4 MG SL SUBL: 0.4000 mg | SUBLINGUAL_TABLET | Freq: Once | SUBLINGUAL | Status: AC

## 2013-03-15 MED ORDER — SODIUM CHLORIDE 0.9 % IJ SOLN: 3.0000 mL | Freq: Two times a day (BID) | INTRAMUSCULAR | Status: DC

## 2013-03-15 MED ORDER — LEVOFLOXACIN 500 MG PO TABS
500.0000 mg | ORAL_TABLET | Freq: Every day | ORAL | Status: DC
  Administered 2013-03-15: 500 mg via ORAL
  Filled 2013-03-15: qty 1

## 2013-03-15 MED ORDER — MORPHINE SULFATE 2 MG/ML IJ SOLN
2.0000 mg | INTRAMUSCULAR | Status: DC | PRN
  Administered 2013-03-15 – 2013-03-16 (×4): 2 mg via INTRAVENOUS
  Filled 2013-03-15 (×4): qty 1

## 2013-03-15 MED ORDER — LISINOPRIL 10 MG PO TABS
20.0000 mg | ORAL_TABLET | Freq: Every day | ORAL | Status: DC
  Filled 2013-03-15: qty 2

## 2013-03-15 MED ORDER — OXYCODONE-ACETAMINOPHEN 10-325 MG PO TABS: 1.0000 | ORAL_TABLET | Freq: Four times a day (QID) | ORAL | Status: DC | PRN

## 2013-03-15 MED ORDER — MORPHINE SULFATE 4 MG/ML IJ SOLN
INTRAMUSCULAR | Status: AC
  Administered 2013-03-15: 4 mg via INTRAVENOUS
  Filled 2013-03-15: qty 1

## 2013-03-15 MED ORDER — PANTOPRAZOLE SODIUM 40 MG PO TBEC
40.0000 mg | DELAYED_RELEASE_TABLET | Freq: Every day | ORAL | Status: DC
  Administered 2013-03-16: 40 mg via ORAL
  Filled 2013-03-15 (×2): qty 1

## 2013-03-15 MED ORDER — SODIUM CHLORIDE 0.9 % IV SOLN: 250.0000 mL | INTRAVENOUS | Status: DC | PRN

## 2013-03-15 MED ORDER — SODIUM CHLORIDE 0.9 % IJ SOLN: 3.0000 mL | INTRAMUSCULAR | Status: DC | PRN

## 2013-03-15 MED ORDER — OXYCODONE HCL 5 MG PO TABS
5.0000 mg | ORAL_TABLET | Freq: Four times a day (QID) | ORAL | Status: DC | PRN
  Administered 2013-03-16: 5 mg via ORAL
  Filled 2013-03-15: qty 1

## 2013-03-15 MED ORDER — HEPARIN SODIUM (PORCINE) 5000 UNIT/ML IJ SOLN
5000.0000 [IU] | Freq: Three times a day (TID) | INTRAMUSCULAR | Status: DC
  Administered 2013-03-15 – 2013-03-16 (×4): 5000 [IU] via SUBCUTANEOUS
  Filled 2013-03-15 (×4): qty 1

## 2013-03-15 MED ORDER — ASPIRIN EC 325 MG PO TBEC
325.0000 mg | DELAYED_RELEASE_TABLET | Freq: Every day | ORAL | Status: DC
  Administered 2013-03-16: 325 mg via ORAL
  Filled 2013-03-15 (×2): qty 1

## 2013-03-15 MED ORDER — ACETAMINOPHEN 325 MG PO TABS: 650.0000 mg | ORAL_TABLET | Freq: Four times a day (QID) | ORAL | Status: DC | PRN

## 2013-03-15 MED ORDER — SUCRALFATE 1 G PO TABS
1.0000 g | ORAL_TABLET | Freq: Three times a day (TID) | ORAL | Status: DC
  Administered 2013-03-15 – 2013-03-16 (×3): 1 g via ORAL
  Filled 2013-03-15 (×3): qty 1

## 2013-03-15 NOTE — H&P (Signed)
[facility] History and Physical  SANTO ZAHRADNIK NWG:[id number] DOB: 1960-10-08 DOA: 03/15/2013  Referring physician:  PCP: Cassell Smiles., MD  Specialists:   Chief Complaint:   HPI: Mike Floyd is a very pleasant 52 y.o. male with a past medical history that includes hypertension, diabetes, chronic kidney disease stage III, nonischemic cardiomyopathy with an ejection fraction of 50%, TIA, obstructive sleep apnea and chronic low back pain who presents to the emergency room with a chief complaint of dyspnea and chest pressure. Information obtained from the patient is wife is at the bedside. He states that yesterday he started with a heaviness sensation in his chest. Associated symptoms include dyspnea at rest nausea. He indicates that the heaviness in his chest was intermittent and did radiate to his left arm last evening but that his shortness of breath was persistent and worse this morning. He denies abdominal pain diaphoresis vomiting. He denies decreased by mouth intake. He denies lower extremity edema or orthopnea. He denies palpitations cough dizziness headache syncope or near-syncope. Patient does report having an MI last year and defibrillator placement that was recently interrogated. Lab work in the emergency room is significant for a creatinine of 1.6, glucose 212, d-dimer 1.24. He also had initial troponin which was negative and proBNP within the limits of normal. EKG yields sinus tachycardia and chest x-ray negative for any acute abnormalities. Of note patient recently started on Levaquin for hydrocele and spermatocele. He reports a worsening of scrotal swelling and erythema and pain. He did see Dr. Jerre Simon 4 days ago and the Levaquin was started. He denies any fever chills.   Review of Systems: The patient denies anorexia, fever, weight loss,, vision loss, decreased hearing, hoarseness, syncope,  peripheral edema, balance deficits, hemoptysis, abdominal pain, melena, hematochezia,  severe indigestion/heartburn, hematuria, incontinence, genital sores, muscle weakness, suspicious skin lesions, transient blindness, difficulty walking, depression, unusual weight change, abnormal bleeding, enlarged lymph nodes, angioedema, and breast masses.    Past Medical History  Diagnosis Date  . Diabetes mellitus DX: 2010  . Gout   . Hypertension   . CKD (chronic kidney disease), stage II   . Erosive esophagitis     a. per EGD (08/2011), Dr. Karilyn Cota - Erosive reflux esophagitis improved but not completely healed since previous EGD 3 years ago. Bx showing  ulcerated gatroesophageal junction mucosa. negative for H. pylori  . Nonischemic dilated cardiomyopathy     a. H/O EF as low as 35-40% by LV gram 10/2007;  b. Echo 02/2011 EF 50-55%, inf HK, Gr 1 DD   . DDD (degenerative disc disease), lumbar   . Hearing deficit     a. wear bilateral hearing aides  . AICD (automatic cardioverter/defibrillator) present 11/2007    a. 11/2007 [facility] Current VR - single lead ICD  . Chest pain     a. 10/2007 Cath:  normal Cors.  . WPW (Wolff-Parkinson-White syndrome)     a. s/p RFCA @ [facility] - 1999  . TIA (transient ischemic attack)     July, 2013  . Tobacco abuse, in remission 06/27/2009    Discontinued in 2009     Past Surgical History  Procedure Laterality Date  . Back surgery      1995  . Appendectomy    . Cardiac defibrillator placement    . Cholecystectomy    . Radiofrequency ablation  1999    WPW; [facility]  . Elbow surgery  06/2010  . Cholecystectomy, laparoscopic      11/2007  .  Esophagogastroduodenoscopy  03/31/2012    also 08/2011; [name]  . Colonoscopy w/ polypectomy  2009   Social History:  reports that he quit smoking about 2 years ago. His smoking use included Cigarettes. He smoked 0.00 packs per day for 60 years. He has never used smokeless tobacco. He reports that he drinks about 0.6 ounces of alcohol per week. He reports that he does not use illicit drugs. Patient  lives at home with his wife. He is a retired Curator and Naval architect. He has 5 siblings and their collective past medical history is positive for heart disease diabetes hypertension. His mother died of breast cancer.  No Known Allergies  Family History  Problem Relation Age of Onset  . Heart attack Mother 66  . Heart attack Father 25  . Heart attack Brother 55  . Hypertension Mother   . Hypertension Father   . Hypertension Brother   . Stroke Maternal Grandmother 80  . Diabetes Mother   . Diabetes Father   . Diabetes Brother      Prior to Admission medications   Medication Sig Start Date End Date Taking? Authorizing Provider  allopurinol (ZYLOPRIM) 300 MG tablet Take 300 mg by mouth daily.    Yes Historical Provider, MD  carvedilol (COREG) 25 MG tablet Take 2 tablets (50 mg total) by mouth 2 (two) times daily with a meal. 09/05/12  Yes Kathlen Brunswick, MD  hydrochlorothiazide (MICROZIDE) 12.5 MG capsule Take 12.5 mg by mouth daily.   Yes Historical Provider, MD  ibuprofen (ADVIL,MOTRIN) 400 MG tablet Take 1 tablet (400 mg total) by mouth every 6 (six) hours as needed for pain. 03/07/13  Yes Derwood Kaplan, MD  levofloxacin (LEVAQUIN) 500 MG tablet Take 500 mg by mouth daily.   Yes Historical Provider, MD  lisinopril (PRINIVIL,ZESTRIL) 20 MG tablet Take 20 mg by mouth daily.   Yes Historical Provider, MD  metFORMIN (GLUCOPHAGE) 500 MG tablet Take 1 tablet (500 mg total) by mouth 2 (two) times daily with a meal. 12/26/12 12/26/13 Yes Kathlen Brunswick, MD  omeprazole (PRILOSEC) 40 MG capsule Take 1 capsule (40 mg total) by mouth daily. 12/26/12  Yes Kathlen Brunswick, MD  oxyCODONE-acetaminophen (PERCOCET) 10-325 MG per tablet Take 1 tablet by mouth every 6 (six) hours as needed for pain.   Yes Historical Provider, MD  sucralfate (CARAFATE) 1 G tablet Take 1 g by mouth 4 (four) times daily. Take 1 tablet by mouth 30 mins before meals and bedtime.   Yes Historical Provider, MD   Physical  Exam: Filed Vitals:   03/15/13 0920  BP: 91/63  Pulse: 109  Temp:   Resp: 11     General:  Well-nourished somewhat uncomfortable appearing  Eyes: PE RRL, EOMI, no scleral icterus  ENT: Ears clear nose without drainage oropharynx without erythema or exudate. Mucous membranes of his mouth are pink slightly dry  Neck: Supple no lymphadenopathy full range of motion  Cardiovascular: Tachycardic but regular no murmur no gallop no rub no lower extremity edema  Respiratory: Moderate increased work of breathing at rest. Breath sounds are somewhat distant. Possible fine crackles on left base otherwise clear. No wheeze no rhonchi  Abdomen: Obese soft positive bowel sounds nontender to palpation.  Skin: Warm and dry no no rash  Musculoskeletal: No clubbing no cyanosis joints without swelling full range of motion  Psychiatric: Slightly anxious but cooperative  Neurologic: Speech clear facial symmetry  Genitalia: Scrotum with mild erythema and warmth particularly on right and  posterior sides. Very tender to palpation.  Labs on Admission:  Basic Metabolic Panel:  Recent Labs Lab 03/15/13 0914  NA 134*  K 4.5  CL 98  CO2 22  GLUCOSE 212*  BUN 14  CREATININE 1.64*  CALCIUM 9.6   Liver Function Tests:  Recent Labs Lab 03/15/13 0914  AST 28  ALT 34  ALKPHOS 86  BILITOT 0.8  PROT 6.7  ALBUMIN 4.3   No results found for this basename: LIPASE, AMYLASE,  in the last 168 hours No results found for this basename: AMMONIA,  in the last 168 hours CBC:  Recent Labs Lab 03/15/13 0902  WBC 4.6  NEUTROABS 3.3  HGB 16.0  HCT 44.2  MCV 90.2  PLT 102*   Cardiac Enzymes:  Recent Labs Lab 03/15/13 0914  TROPONINI <0.30    BNP (last 3 results)  Recent Labs  03/15/13 0914  PROBNP 33.1   CBG: No results found for this basename: GLUCAP,  in the last 168 hours  Radiological Exams on Admission: Dg Chest Portable 1 View  03/15/2013   *RADIOLOGY REPORT*  Clinical  Data: Chest pain, shortness of breath  PORTABLE CHEST - 1 VIEW  Comparison: 03/27/2012  Findings: The pacing device is again seen.  Cardiac shadow is stable.  The lungs are clear bilaterally.  No acute abnormality is noted.  IMPRESSION: No acute abnormality noted.   Original Report Authenticated By: Alcide Clever, M.D.    EKG: Independently reviewed. Sinus tachycardia unchanged from previous last year  Assessment/Plan Principal Problem:   Dyspnea: Etiology uncertain. Concern for PE. Will get a CT angiography chest. Will admit for observation given his cardiac history. Cycle is cardiac enzymes repeat EKG in the morning. We'll provide supportive therapy in the form of oxygen supplement patient, pain medicine, antimanic as needed. Will monitor oxygen saturation level. Active Problems:  Chest pain: Atypical. Somewhat improved since presentation. Initial troponin is negative. EKG is unchanged from last year yielding sinus tachycardia. Will admit for observation. We'll cycle his cardiac enzymes and repeat an EKG in the morning. Patient does not appear to be in failure. Most recent echo last year or yields an ejection fraction of 50-55% and grade 1 diastolic dysfunction. Will provide aspirin and continue his beta blocker.  Hydrocele: Recent ultrasound of the scrotum. Worsening over the last 4 days in terms of swelling tenderness and pain. Will repeat scrotal ultrasound to rule out torsion . Patient was seen by a urologist 4 days ago and Levaquin was started will continue that. She is currently afebrile and hemodynamically stable.    Spermatocele: See above.    HTN (hypertension): Somewhat soft in the emergency room. Will hold his hydrochlorothiazide and continue his beta blocker and lisinopril. Will monitor closely     Chronic kidney disease, stage 3: Appears to be at baseline. Will monitor intake and output. Cardiomyopathy, nonischemic: See #2. No signs of failure at this time. Will monitor on telemetry.  Will get daily weights and strict intake and output.     DIABETES MELLITUS, TYPE II: Patient takes metformin at home not currently on insulin. Will hold that for now we will provide a car modified diet and use sliding scale insulin for optimal glycemic control. Will check his hemoglobin A1c      GERD (gastroesophageal reflux disease): Stable at baseline we'll continue his PPI   AICD (automatic cardioverter/defibrillator): Patient seen in June by cardiology. Device interrogated at that time.     OSA (obstructive sleep apnea): Study of same.  Patient currently states he is unable to get a CPAP machine due to the type of insurance that he has. Ask respiratory therapy to evaluate and provide as needed     Code Status: Full Family Communication: Wife at bedside Disposition Plan: Home hopefully 24 hour  Time spent: 70 minutes  Gwenyth Bender [facility] Pager [phone]  If 7PM-7AM, please contact night-coverage [email or web address] Password [facility] 03/15/2013, 11:27 AM Attending: Patient seen and examined. I agree that there is concern for only embolism and he certainly has risk factors. CT angiogram of the chest is appropriate.

## 2013-03-15 NOTE — Progress Notes (Signed)
CPAP set up with medium full face mask. Patient wants to wait a little bit to go on it. RT will check back a little later and place patient on it. RT will continue to monitor

## 2013-03-15 NOTE — Plan of Care (Signed)
Problem: Consults Goal: Tobacco Cessation referral if indicated Outcome: Not Applicable Date Met:  03/15/13 Pt quit a few yrs ago.      Problem: Phase I Progression Outcomes Goal: Anginal pain relieved Outcome: Progressing Pain is decreased with medicaitons.

## 2013-03-15 NOTE — ED Provider Notes (Signed)
CSN: [id number]     Arrival date & time 03/15/13  4098 History  This chart was scribed for Mike Lyons, MD by Bennett Scrape, ED Scribe. This patient was seen in room APA07/APA07 and the patient's care was started at 9:07 AM.   Chief Complaint  Patient presents with  . Chest Pain    The history is provided by the patient. No language interpreter was used.   HPI Comments: Mike Floyd is a 52 y.o. male who presents to the Emergency Department complaining of CP with associated SOB and nausea. Pt describes the pain as a pressure that radiates down the left arm that started 2 hours ago but reports that the SOB and nausea started last night. He has an extensive cardiac history including a "mild " MI last year, nonischemic cardiomyopathy and defibrillator placement He denies having any NTG or other medications at home for chest pain. He denies cough, leg swelling. He is a former smoker, with 3 years ago. He reports having a h/o COPD but denies having an inhaler at home. DM is treated with 500 mg metformin x2 daily.  Pt is currently following up with Dr. Jerre Simon for prostate issues. He denies changes.  Cardiologist is Dr. Dietrich Pates  Past Medical History  Diagnosis Date  . Diabetes mellitus DX: 2010  . Gout   . Hypertension   . CKD (chronic kidney disease), stage II   . Erosive esophagitis     a. per EGD (08/2011), Dr. Karilyn Cota - Erosive reflux esophagitis improved but not completely healed since previous EGD 3 years ago. Bx showing  ulcerated gatroesophageal junction mucosa. negative for H. pylori  . Nonischemic dilated cardiomyopathy     a. H/O EF as low as 35-40% by LV gram 10/2007;  b. Echo 02/2011 EF 50-55%, inf HK, Gr 1 DD   . DDD (degenerative disc disease), lumbar   . Hearing deficit     a. wear bilateral hearing aides  . AICD (automatic cardioverter/defibrillator) present 11/2007    a. 11/2007 [facility] Current VR - single lead ICD  . Chest pain     a. 10/2007 Cath:  normal Cors.  . WPW  (Wolff-Parkinson-White syndrome)     a. s/p RFCA @ [facility] - 1999  . TIA (transient ischemic attack)     July, 2013  . Tobacco abuse, in remission 06/27/2009    Discontinued in 2009     Past Surgical History  Procedure Laterality Date  . Back surgery      1995  . Appendectomy    . Cardiac defibrillator placement    . Cholecystectomy    . Radiofrequency ablation  1999    WPW; performed at [facility]  . Elbow surgery  06/2010  . Cholecystectomy, laparoscopic      11/2007  . Esophagogastroduodenoscopy  03/31/2012    also 08/2011; [name]  . Colonoscopy w/ polypectomy  2009   Family History  Problem Relation Age of Onset  . Heart attack Mother 34  . Heart attack Father 3  . Heart attack Brother 55  . Hypertension Mother   . Hypertension Father   . Hypertension Brother   . Stroke Maternal Grandmother 80  . Diabetes Mother   . Diabetes Father   . Diabetes Brother    History  Substance Use Topics  . Smoking status: Former Smoker -- 60 years    Types: Cigarettes    Quit date: 07/30/2010  . Smokeless tobacco: Never Used  . Alcohol Use: 0.6 oz/week  1 Cans of beer per week     Comment: used "years ago"    Review of Systems  Respiratory: Positive for shortness of breath.   Cardiovascular: Positive for chest pain.  Gastrointestinal: Positive for nausea and vomiting.  All other systems reviewed and are negative.    Allergies  Review of patient's allergies indicates no known allergies.  Home Medications   Current Outpatient Rx  Name  Route  Sig  Dispense  Refill  . allopurinol (ZYLOPRIM) 300 MG tablet   Oral   Take 300 mg by mouth daily.          . carvedilol (COREG) 25 MG tablet   Oral   Take 2 tablets (50 mg total) by mouth 2 (two) times daily with a meal.   120 tablet   6   . hydrochlorothiazide (MICROZIDE) 12.5 MG capsule   Oral   Take 12.5 mg by mouth daily.         Marland Kitchen HYDROcodone-acetaminophen (NORCO/VICODIN) 5-325 MG per tablet   Oral   Take 1  tablet by mouth every 6 (six) hours as needed for pain.   15 tablet   0   . ibuprofen (ADVIL,MOTRIN) 400 MG tablet   Oral   Take 1 tablet (400 mg total) by mouth every 6 (six) hours as needed for pain.   30 tablet   0   . lisinopril (PRINIVIL,ZESTRIL) 20 MG tablet   Oral   Take 20 mg by mouth daily.         . metFORMIN (GLUCOPHAGE) 500 MG tablet   Oral   Take 1 tablet (500 mg total) by mouth 2 (two) times daily with a meal.   60 tablet   3   . omeprazole (PRILOSEC) 40 MG capsule   Oral   Take 1 capsule (40 mg total) by mouth daily.   30 capsule   6   . ondansetron (ZOFRAN ODT) 8 MG disintegrating tablet   Oral   Take 1 tablet (8 mg total) by mouth every 8 (eight) hours as needed for nausea.   20 tablet   0   . oxyCODONE-acetaminophen (PERCOCET) 10-325 MG per tablet   Oral   Take 1 tablet by mouth every 6 (six) hours as needed for pain.         Marland Kitchen sucralfate (CARAFATE) 1 G tablet   Oral   Take 1 g by mouth 4 (four) times daily. Take 1 tablet by mouth 30 mins before meals and bedtime.          Triage Vitals: BP 129/77  Pulse 95  Temp(Src) 98.5 F (36.9 C) (Oral)  Resp 24  Ht 6\' 1"  (1.854 m)  Wt 242 lb (109.77 kg)  BMI 31.93 kg/m2  SpO2 100%  Physical Exam  Nursing note and vitals reviewed. Constitutional: He is oriented to person, place, and time. He appears well-developed and well-nourished.  Appears dyspneic and anxious  HENT:  Head: Normocephalic and atraumatic.  Eyes: Conjunctivae and EOM are normal.  Neck: Normal range of motion. Neck supple. No tracheal deviation present.  Cardiovascular: Normal rate, regular rhythm and normal heart sounds.   No murmur heard. Pulmonary/Chest: Effort normal and breath sounds normal. No respiratory distress. He has no wheezes. He has no rales.  Abdominal: Soft. Bowel sounds are normal. There is no tenderness.  Musculoskeletal: Normal range of motion. He exhibits no edema.  Neurological: He is alert and oriented  to person, place, and time. No cranial nerve deficit.  Skin: Skin is warm and dry.  Psychiatric: His behavior is normal. His mood appears anxious.    ED Course   Procedures (including critical care time)  DIAGNOSTIC STUDIES: Oxygen Saturation is 100% on [place], normal by my interpretation.    COORDINATION OF CARE: 9:10 AM-Discussed treatment plan which includes  (CXR, CBC panel, CMP, UA) with pt at bedside and pt agreed to plan.   Labs Reviewed  CBC WITH DIFFERENTIAL  BASIC METABOLIC PANEL   No results found. No diagnosis found.   Date: 03/15/2013  Rate: 106  Rhythm: sinus tachycardia  QRS Axis: normal  Intervals: normal  ST/T Wave abnormalities: normal  Conduction Disutrbances:none  Narrative Interpretation:   Old EKG Reviewed: unchanged    MDM  Patient with extensive pmh presents with complaints of shortness of breath and chest pain since last night.  The exam shows him to be somewhat anxious, but is otherwise non-focal.  The workup reveals an unchanged ekg with the exception of tachycardia.  There is no evidence for chf and troponin is negative.  Due to extensive pmh, I believe he requires admission for observation and further workup.  Will consult [facility].    I personally performed the services described in this documentation, which was scribed in my presence. The recorded information has been reviewed and is accurate.      Mike Lyons, MD 03/15/13 1016

## 2013-03-15 NOTE — Progress Notes (Signed)
UR Chart Review Completed  

## 2013-03-15 NOTE — ED Notes (Signed)
Pt reports chest pain radiating to left arm x 2 hours with SOB and nausea.  PT reports nausea started last night.  Pt describes pain as severe pressure in chest and left arm.  PT says feels like something is squeezing left elbow.

## 2013-03-15 NOTE — Progress Notes (Signed)
Placed patient on cpap 5cmH20, patient stated it was too much-decreased to 4cmH20 with 2L bled in. Patient wore for 5 minutes and said he couldn't stand it. Placed patient back on his [facility], told patient if he changed his mind and wanted to try again to get RN to call me. RT will continue to monitor.

## 2013-03-16 DIAGNOSIS — R079 Chest pain, unspecified: Secondary | ICD-10-CM | POA: Diagnosis not present

## 2013-03-16 DIAGNOSIS — J189 Pneumonia, unspecified organism: Secondary | ICD-10-CM | POA: Diagnosis not present

## 2013-03-16 DIAGNOSIS — I10 Essential (primary) hypertension: Secondary | ICD-10-CM | POA: Diagnosis not present

## 2013-03-16 DIAGNOSIS — R0989 Other specified symptoms and signs involving the circulatory and respiratory systems: Secondary | ICD-10-CM | POA: Diagnosis not present

## 2013-03-16 DIAGNOSIS — I428 Other cardiomyopathies: Secondary | ICD-10-CM | POA: Diagnosis not present

## 2013-03-16 DIAGNOSIS — R0609 Other forms of dyspnea: Secondary | ICD-10-CM | POA: Diagnosis not present

## 2013-03-16 DIAGNOSIS — E119 Type 2 diabetes mellitus without complications: Secondary | ICD-10-CM | POA: Diagnosis not present

## 2013-03-16 LAB — COMPREHENSIVE METABOLIC PANEL
ALT: 32 U/L (ref 0–53)
AST: 22 U/L (ref 0–37)
Albumin: 4.2 g/dL (ref 3.5–5.2)
Alkaline Phosphatase: 74 U/L (ref 39–117)
CO2: 25 mEq/L (ref 19–32)
Chloride: 99 mEq/L (ref 96–112)
Creatinine, Ser: 1.61 mg/dL — ABNORMAL HIGH (ref 0.50–1.35)
GFR calc non Af Amer: 47 mL/min — ABNORMAL LOW (ref >90)
Potassium: 4.1 mEq/L (ref 3.5–5.1)
Total Bilirubin: 0.8 mg/dL (ref 0.3–1.2)

## 2013-03-16 LAB — HEMOGLOBIN A1C
Hgb A1c MFr Bld: 5.2 % (ref <5.7)
Mean Plasma Glucose: 103 mg/dL (ref <117)

## 2013-03-16 MED ORDER — DOXYCYCLINE HYCLATE 100 MG PO TABS: 100.0000 mg | ORAL_TABLET | Freq: Two times a day (BID) | ORAL | Status: DC

## 2013-03-16 MED ORDER — CEFUROXIME AXETIL 500 MG PO TABS: 500.0000 mg | ORAL_TABLET | Freq: Two times a day (BID) | ORAL | Status: DC

## 2013-03-16 MED ORDER — LISINOPRIL 10 MG PO TABS
10.0000 mg | ORAL_TABLET | Freq: Every day | ORAL | Status: DC
  Administered 2013-03-16: 10 mg via ORAL
  Filled 2013-03-16: qty 1

## 2013-03-16 MED ORDER — SODIUM CHLORIDE 0.9 % IV SOLN: 250.0000 mL | INTRAVENOUS | Status: DC | PRN

## 2013-03-16 MED ORDER — DOXYCYCLINE HYCLATE 100 MG PO TABS
100.0000 mg | ORAL_TABLET | Freq: Two times a day (BID) | ORAL | Status: DC
  Administered 2013-03-16: 100 mg via ORAL
  Filled 2013-03-16: qty 1

## 2013-03-16 MED ORDER — CEFUROXIME AXETIL 250 MG PO TABS
500.0000 mg | ORAL_TABLET | Freq: Two times a day (BID) | ORAL | Status: DC
  Administered 2013-03-16: 500 mg via ORAL
  Filled 2013-03-16 (×2): qty 1

## 2013-03-16 NOTE — Discharge Summary (Signed)
Physician Discharge Summary  Mike Floyd BMW:[id number] DOB: May 01, 1961 DOA: 03/15/2013  PCP: Cassell Smiles., MD  Admit date: 03/15/2013 Discharge date: 03/16/2013  Time spent: 45 minutes  Recommendations for Outpatient Follow-up:  Follow up with PCP in 1 week for evaluation of symptoms Follow up with Dr. Rudean Haskell office 03/19/13 if no improvement in scrotal pain/swelling   Discharge Diagnoses:  Principal Problem:   CAP (community acquired pneumonia) Active Problems:   DIABETES MELLITUS, TYPE II   Cardiomyopathy, nonischemic   GERD (gastroesophageal reflux disease)   AICD (automatic cardioverter/defibrillator)   Chest pain   Chronic kidney disease, stage 3   OSA (obstructive sleep apnea)   HTN (hypertension)   Hydrocele   Spermatocele   Dyspnea   Discharge Condition: stabel  Diet recommendation: carb modified  Filed Weights   03/15/13 0903 03/15/13 1623 03/16/13 0442  Weight: 242 lb (109.77 kg) 244 lb 0.8 oz (110.7 kg) 243 lb (110.224 kg)    History of present illness:  Mike Floyd is a very pleasant 52 y.o. male with a past medical history that includes hypertension, diabetes, chronic kidney disease stage III, nonischemic cardiomyopathy with an ejection fraction of 50%, TIA, obstructive sleep apnea and chronic low back pain who presented to the emergency room on 03/15/13 with a chief complaint of dyspnea and chest pressure. He stated that on 03/14/13 he started with a heaviness sensation in his chest. Associated symptoms included dyspnea at rest and nausea. He indicated that the heaviness in his chest was intermittent and did radiate to his left arm but that his shortness of breath was persistent and worse the morning of admission. He denied abdominal pain diaphoresis vomiting. He denied decreased by mouth intake. He denied lower extremity edema or orthopnea. He denied palpitations cough dizziness headache syncope or near-syncope. Patient did report having an MI last year and  defibrillator placement that was recently interrogated. Lab work in the emergency room  significant for a creatinine of 1.6, glucose 212, d-dimer 1.24. He also had initial troponin which was negative and proBNP within the limits of normal. EKG yielded sinus tachycardia and chest x-ray negative for any acute abnormalities. Of note patient recently started on Levaquin for hydrocele and spermatocele. He reported a worsening of scrotal swelling and erythema and pain. He did see Dr. Jerre Simon 4 days prior to admission and  Levaquin was started. He denied any fever chills.   Hospital Course:  CAP/Dyspnea: Initially concern for PE so CT angiography chest obtained. CT chest negative for PE but yields a focal area of airspace consolidation in the medial aspect of the superior segment right lower lobe consistent with pneumonia. Patchy areas of ground-glass opacity bilaterally may represent  atelectatic change or other areas of early pneumonia. His levaquin continued then changed to ceftin and doxy given his hydrocele and spermatocele. In addition, cardiac enzymes cycled and were negative. repeat EKG yielded NSR without evidence of ischemia. His oxygen saturation level on room air with ambulation >90%. At discharge no complaints of dyspnea, afebrile and non-toxic appearing.    Active Problems:  Chest pain: Atypical. Troponins cycled and  negative. EKG is sinus rhythm without evidence of ischemia. Most recent echo last year or yields an ejection fraction of 50-55% and grade 1 diastolic dysfunction.At discharge continued with intermittent pressure but much less intense. Likely re;ated to#1. No indication of cardiac source.    Hydrocele: Presented with worsening pain/swelling. Repeat ultrasound of the scrotum yielded stable examination compared with prior study from last week.  Persistent bilateral spermatoceles and hydroceles. No evidence of testicular torsion or abscess. Patient was seen by a urologist 4 days prior and  Levaquin was started. Levaquin discontinued and ceftin and doxy provided for 7 day course. He remained afebrile and hemodynamically stable.   Spermatocele: See above.   HTN (hypertension): Somewhat soft in the emergency room. Hhydrochlorothiazide initially held. At discharge will resume and continue his beta blocker and lisinopril. Chronic kidney disease, stage 3: remained stable at  baseline.  .  Cardiomyopathy, nonischemic: See #2. No signs of failure.ProBnp within the limits of normal   DIABETES MELLITUS, TYPE II: Patient takes metformin at home not currently on insulin. A1C 5.2  GERD (gastroesophageal reflux disease): Stable at baseline. Continue his PPI   AICD (automatic cardioverter/defibrillator): Patient seen in June by cardiology. Device interrogated at that time.   OSA (obstructive sleep apnea): Patient currently states he is unable to get a CPAP machine due to the type of insurance that he has.   Procedures:  none  Consultations:  none  Discharge Exam: Filed Vitals:   03/16/13 1026  BP: 106/65  Pulse: 101  Temp:   Resp: 20    General: well nourished NAD Cardiovascular: RRR No MGR No LE edema Respiratory: normal effort BS clear bilaterally to auscultation. No wheeze no rhonchi no crackles Abdomen soft +BS non-tender to palpation  Discharge Instructions  Discharge Orders   Future Appointments Provider Department Dept Phone   04/24/2013 8:30 AM Marinus Maw, MD [facility] [place] [phone]   Future Orders Complete By Expires     Diet - low sodium heart healthy  As directed     Discharge instructions  As directed     Comments:      Take medications as directed. Follow up with PCP 1 week for evaluation of symptoms. Follow up with Dr Jerre Simon if no improvement in scrotal swelling/pain with new antibiotics.    Increase activity slowly  As directed         Medication List    STOP taking these medications       levofloxacin 500 MG tablet   Commonly known as:  LEVAQUIN      TAKE these medications       allopurinol 300 MG tablet  Commonly known as:  ZYLOPRIM  Take 300 mg by mouth daily.     carvedilol 25 MG tablet  Commonly known as:  COREG  Take 2 tablets (50 mg total) by mouth 2 (two) times daily with a meal.     cefUROXime 500 MG tablet  Commonly known as:  CEFTIN  Take 1 tablet (500 mg total) by mouth 2 (two) times daily with a meal.     doxycycline 100 MG tablet  Commonly known as:  VIBRA-TABS  Take 1 tablet (100 mg total) by mouth every 12 (twelve) hours.     hydrochlorothiazide 12.5 MG capsule  Commonly known as:  MICROZIDE  Take 12.5 mg by mouth daily.     ibuprofen 400 MG tablet  Commonly known as:  ADVIL,MOTRIN  Take 1 tablet (400 mg total) by mouth every 6 (six) hours as needed for pain.     lisinopril 20 MG tablet  Commonly known as:  PRINIVIL,ZESTRIL  Take 20 mg by mouth daily.     metFORMIN 500 MG tablet  Commonly known as:  GLUCOPHAGE  Take 1 tablet (500 mg total) by mouth 2 (two) times daily with a meal.     omeprazole 40 MG capsule  Commonly known as:  PRILOSEC  Take 1 capsule (40 mg total) by mouth daily.     oxyCODONE-acetaminophen 10-325 MG per tablet  Commonly known as:  PERCOCET  Take 1 tablet by mouth every 6 (six) hours as needed for pain.     sucralfate 1 G tablet  Commonly known as:  CARAFATE  Take 1 g by mouth 4 (four) times daily. Take 1 tablet by mouth 30 mins before meals and bedtime.       No Known Allergies    The results of significant diagnostics from this hospitalization (including imaging, microbiology, ancillary and laboratory) are listed below for reference.    Significant Diagnostic Studies: Ct Angio Chest Pe W/cm &/or Wo Cm  03/15/2013   *RADIOLOGY REPORT*  Clinical Data: Chest pain and shortness of breath  CT ANGIOGRAPHY CHEST  Technique:  Multidetector CT imaging of the chest using the standard protocol during bolus administration of intravenous  contrast. Multiplanar reconstructed images including MIPs were obtained and reviewed to evaluate the vascular anatomy.  Contrast: 80mL OMNIPAQUE IOHEXOL 350 MG/ML SOLN  Comparison: Chest radiograph March 15, 2013  Findings:  There is no demonstrable pulmonary embolus.  There is no thoracic aortic aneurysm or dissection.  Pacemaker lead is attached to the right ventricle.  Pericardium is not thickened.  There are patchy areas of ground-glass type opacity in the lungs bilaterally.  There is an area of focal consolidation in the medial aspect of the superior segment of the right lower lobe.  There is no appreciable thoracic adenopathy.  There is a small hiatal hernia.  In the upper abdomen, spleen is incompletely visualized.  Spleen measures 17 cm in length, enlarged.  Visualized upper abdominal structures otherwise appear normal.  There is degenerative change in the thoracic spine with slight anterior wedging of several thoracic vertebral bodies.  There are no blastic or lytic bone lesions.  Thyroid appears normal.  IMPRESSION: Focal area of airspace consolidation in the medial aspect of the superior segment right lower lobe consistent with pneumonia. Patchy areas of ground-glass opacity bilaterally may represent atelectatic change or other areas of early pneumonia.  There is splenomegaly.  There is a small hiatal hernia.  No adenopathy.  No demonstrable pulmonary embolus.   Original Report Authenticated By: Bretta Bang, M.D.   US Scrotum  03/15/2013   *RADIOLOGY REPORT*  Clinical Data: Progressive scrotal pain and swelling.  Recently diagnosed with hydrocele and spermatocele.  SCROTAL ULTRASOUND DOPPLER ULTRASOUND OF THE TESTICLES  Technique:  Complete ultrasound examination of the testicles, epididymis, and other scrotal structures was performed.  Color and spectral Doppler ultrasound were also utilized to evaluate blood flow to the testicles.  Comparison:  Scrotal ultrasound 03/07/2013.  Findings:  The  testicles are symmetric in size and echogenicity. The right testis measures 5.2 x 3.7 x 4.1 cm and the left testis measures 5.2 x 3.2 x 4.8 cm.  No testicular masses are seen.  There is mild left-sided microlithiasis.  Bilateral spermatoceles are again noted, similar to the prior study.  The largest on the right measures 2.2 cm and the largest on the left 1.1 cm.  Left greater than right hydroceles are similar to the prior study.  Varicoceles are less definitively demonstrated by the current study, although the vessels are prominent on the right.  Blood flow is seen within both testicles on color Doppler sonography.  Doppler spectral waveforms show both arterial and venous flow signal in both testicles.  IMPRESSION:  1.  Stable examination  compared with prior study from last week. 2.  Persistent bilateral spermatoceles and hydroceles. 3.  No evidence of testicular torsion or abscess. 4.  Small left testicular calcifications. Current literature suggests that testicular microlithiasis is not a significant independent risk factor for development of testicular carcinoma, and that followup imaging is not warranted in the absence of other risk factors.  Monthly testicular self-examination and annual physical exams are considered appropriate surveillance.  If patient has other risk factors for testicular carcinoma, then referral to Urology should be considered.  (Reference:  DeCastro, et al.:  A 5- Year Followup Study of Asymptomatic Men with Testicular Microlithiasis.  J Urol 2008; 179:1420-1423.)   Original Report Authenticated By: Carey Bullocks, M.D.   US Scrotum  03/07/2013   *RADIOLOGY REPORT*  Clinical Data:  Right scrotal tenderness  SCROTAL ULTRASOUND DOPPLER ULTRASOUND OF THE TESTICLES  Technique: Complete ultrasound examination of the testicles, epididymis, and other scrotal structures was performed.  Color and spectral Doppler ultrasound were also utilized to evaluate blood flow to the testicles.   Comparison:  None.  Findings:  Right testis:  Normal in size appearance, measuring 4.9 x 2.8 x 3.8 cm.  Left testis:  Normal in size appearance, measuring 4.8 x 3.5 x 4.0 cm.  Limited microlithiasis.  Right epididymis:  Multiple epididymal head cysts / spermatocele, measuring up to 2.2 cm.  Left epididymis:  Multiple epididymal head cysts / spermatocele, measuring up to 1.2 cm.  Hydrocele:  Present bilaterally.  Varicocele:  Present bilaterally.  Pulsed Doppler interrogation of both testes demonstrates low resistance flow bilaterally.  IMPRESSION: Bilateral spermatoceles, right greater than left.  Bilateral hydroceles with small varicoceles.  No evidence of testicular torsion.   Original Report Authenticated By: Charline Bills, M.D.   [place] Art/ven Flow Abd Pelv Doppler  03/15/2013   *RADIOLOGY REPORT*  Clinical Data: Progressive scrotal pain and swelling.  Recently diagnosed with hydrocele and spermatocele.  SCROTAL ULTRASOUND DOPPLER ULTRASOUND OF THE TESTICLES  Technique:  Complete ultrasound examination of the testicles, epididymis, and other scrotal structures was performed.  Color and spectral Doppler ultrasound were also utilized to evaluate blood flow to the testicles.  Comparison:  Scrotal ultrasound 03/07/2013.  Findings:  The testicles are symmetric in size and echogenicity. The right testis measures 5.2 x 3.7 x 4.1 cm and the left testis measures 5.2 x 3.2 x 4.8 cm.  No testicular masses are seen.  There is mild left-sided microlithiasis.  Bilateral spermatoceles are again noted, similar to the prior study.  The largest on the right measures 2.2 cm and the largest on the left 1.1 cm.  Left greater than right hydroceles are similar to the prior study.  Varicoceles are less definitively demonstrated by the current study, although the vessels are prominent on the right.  Blood flow is seen within both testicles on color Doppler sonography.  Doppler spectral waveforms show both arterial and venous flow  signal in both testicles.  IMPRESSION:  1.  Stable examination compared with prior study from last week. 2.  Persistent bilateral spermatoceles and hydroceles. 3.  No evidence of testicular torsion or abscess. 4.  Small left testicular calcifications. Current literature suggests that testicular microlithiasis is not a significant independent risk factor for development of testicular carcinoma, and that followup imaging is not warranted in the absence of other risk factors.  Monthly testicular self-examination and annual physical exams are considered appropriate surveillance.  If patient has other risk factors for testicular carcinoma, then referral to Urology should be considered.  (  Reference:  Ellender Hose al.:  A 5- Year Followup Study of Asymptomatic Men with Testicular Microlithiasis.  J Urol 2008; 179:1420-1423.)   Original Report Authenticated By: Carey Bullocks, M.D.   [place] Art/ven Flow Abd Pelv Doppler  03/07/2013   *RADIOLOGY REPORT*  Clinical Data:  Right scrotal tenderness  SCROTAL ULTRASOUND DOPPLER ULTRASOUND OF THE TESTICLES  Technique: Complete ultrasound examination of the testicles, epididymis, and other scrotal structures was performed.  Color and spectral Doppler ultrasound were also utilized to evaluate blood flow to the testicles.  Comparison:  None.  Findings:  Right testis:  Normal in size appearance, measuring 4.9 x 2.8 x 3.8 cm.  Left testis:  Normal in size appearance, measuring 4.8 x 3.5 x 4.0 cm.  Limited microlithiasis.  Right epididymis:  Multiple epididymal head cysts / spermatocele, measuring up to 2.2 cm.  Left epididymis:  Multiple epididymal head cysts / spermatocele, measuring up to 1.2 cm.  Hydrocele:  Present bilaterally.  Varicocele:  Present bilaterally.  Pulsed Doppler interrogation of both testes demonstrates low resistance flow bilaterally.  IMPRESSION: Bilateral spermatoceles, right greater than left.  Bilateral hydroceles with small varicoceles.  No evidence of  testicular torsion.   Original Report Authenticated By: Charline Bills, M.D.   Dg Chest Portable 1 View  03/15/2013   *RADIOLOGY REPORT*  Clinical Data: Chest pain, shortness of breath  PORTABLE CHEST - 1 VIEW  Comparison: 03/27/2012  Findings: The pacing device is again seen.  Cardiac shadow is stable.  The lungs are clear bilaterally.  No acute abnormality is noted.  IMPRESSION: No acute abnormality noted.   Original Report Authenticated By: Alcide Clever, M.D.    Microbiology: No results found for this or any previous visit (from the past 240 hour(s)).   Labs: Basic Metabolic Panel:  Recent Labs Lab 03/15/13 0914 03/16/13 0452  NA 134* 136  K 4.5 4.1  CL 98 99  CO2 22 25  GLUCOSE 212* 113*  BUN 14 15  CREATININE 1.64* 1.61*  CALCIUM 9.6 9.3   Liver Function Tests:  Recent Labs Lab 03/15/13 0914 03/16/13 0452  AST 28 22  ALT 34 32  ALKPHOS 86 74  BILITOT 0.8 0.8  PROT 6.7 6.4  ALBUMIN 4.3 4.2   No results found for this basename: LIPASE, AMYLASE,  in the last 168 hours No results found for this basename: AMMONIA,  in the last 168 hours CBC:  Recent Labs Lab 03/15/13 0902  WBC 4.6  NEUTROABS 3.3  HGB 16.0  HCT 44.2  MCV 90.2  PLT 102*   Cardiac Enzymes:  Recent Labs Lab 03/15/13 0914 03/15/13 1424 03/15/13 2140  TROPONINI <0.30 <0.30 <0.30   BNP: BNP (last 3 results)  Recent Labs  03/15/13 0914  PROBNP 33.1   CBG:  Recent Labs Lab 03/15/13 1650 03/15/13 2217  GLUCAP 123* 127*       Signed:  BLACK,KAREN M  [facility] 03/16/2013, 1:39 PM Attending: Patient seen and examined. He is stable for discharge and will followup with primary care physician in one week.

## 2013-03-16 NOTE — Care Management Note (Signed)
    Page 1 of 1   03/16/2013     2:49:05 PM   CARE MANAGEMENT NOTE 03/16/2013  Patient:  KYLIL, SWOPES   Account Number:  [id number]  Date Initiated:  03/16/2013  Documentation initiated by:  Rosemary Holms  Subjective/Objective Assessment:   Pt admitted from home with PNA. Lives with wife and will return home at DC. Pt asked CM to assist with a medication they spelled as Cycerta. Stated Dr. Sherwood Gambler was working on it.  CM call Dr. Sherwood Gambler' s office and spoke to [facility] RN.     Action/Plan:   Per Meloday there is not medication they are helping pt with. No medication on pt's med list even close to it.   Anticipated DC Date:  03/16/2013   Anticipated DC Plan:  HOME/SELF CARE      DC Planning Services  CM consult      Choice offered to / List presented to:             Status of service:  Completed, signed off Medicare Important Message given?   (If response is "NO", the following Medicare IM given date fields will be blank) Date Medicare IM given:   Date Additional Medicare IM given:    Discharge Disposition:  HOME/SELF CARE  Per UR Regulation:    If discussed at Long Length of Stay Meetings, dates discussed:    Comments:  03/16/13 Rosemary Holms RN BSN CM

## 2013-03-16 NOTE — Progress Notes (Signed)
Pt states that his breathing seems to be worse with activity. Pt ambulated and his O2 saturations ranged from 97-100 % room air.  He was ambulated 250 ft approx.  Toya Smothers NP was notified.  He was minimally SOB.  Will continue to monitor.

## 2013-03-19 ENCOUNTER — Encounter: Payer: Self-pay | Admitting: Internal Medicine

## 2013-03-27 DIAGNOSIS — I10 Essential (primary) hypertension: Secondary | ICD-10-CM | POA: Diagnosis not present

## 2013-03-27 DIAGNOSIS — Z6831 Body mass index (BMI) 31.0-31.9, adult: Secondary | ICD-10-CM | POA: Diagnosis not present

## 2013-03-27 DIAGNOSIS — K219 Gastro-esophageal reflux disease without esophagitis: Secondary | ICD-10-CM | POA: Diagnosis not present

## 2013-03-27 DIAGNOSIS — J189 Pneumonia, unspecified organism: Secondary | ICD-10-CM | POA: Diagnosis not present

## 2013-03-27 DIAGNOSIS — E1129 Type 2 diabetes mellitus with other diabetic kidney complication: Secondary | ICD-10-CM | POA: Diagnosis not present

## 2013-04-16 DIAGNOSIS — K219 Gastro-esophageal reflux disease without esophagitis: Secondary | ICD-10-CM | POA: Diagnosis not present

## 2013-04-16 DIAGNOSIS — R0602 Shortness of breath: Secondary | ICD-10-CM | POA: Diagnosis not present

## 2013-04-16 DIAGNOSIS — E1129 Type 2 diabetes mellitus with other diabetic kidney complication: Secondary | ICD-10-CM | POA: Diagnosis not present

## 2013-04-16 DIAGNOSIS — G5770 Causalgia of unspecified lower limb: Secondary | ICD-10-CM | POA: Diagnosis not present

## 2013-04-16 DIAGNOSIS — Z6831 Body mass index (BMI) 31.0-31.9, adult: Secondary | ICD-10-CM | POA: Diagnosis not present

## 2013-04-16 DIAGNOSIS — Z79899 Other long term (current) drug therapy: Secondary | ICD-10-CM | POA: Diagnosis not present

## 2013-04-20 ENCOUNTER — Other Ambulatory Visit: Payer: Self-pay | Admitting: Cardiology

## 2013-04-24 ENCOUNTER — Encounter: Payer: Self-pay | Admitting: Internal Medicine

## 2013-04-24 ENCOUNTER — Ambulatory Visit (INDEPENDENT_AMBULATORY_CARE_PROVIDER_SITE_OTHER): Payer: Medicare Other | Admitting: Internal Medicine

## 2013-04-24 VITALS — BP 97/72 | HR 97 | Ht 73.0 in | Wt 240.4 lb

## 2013-04-24 DIAGNOSIS — I428 Other cardiomyopathies: Secondary | ICD-10-CM

## 2013-04-24 DIAGNOSIS — Z95810 Presence of automatic (implantable) cardiac defibrillator: Secondary | ICD-10-CM

## 2013-04-24 DIAGNOSIS — I5022 Chronic systolic (congestive) heart failure: Secondary | ICD-10-CM | POA: Diagnosis not present

## 2013-04-24 LAB — ICD DEVICE OBSERVATION
DEV-0020ICD: NEGATIVE
DEVICE MODEL ICD: 536141
RV LEAD AMPLITUDE: 7.9 mv
RV LEAD IMPEDENCE ICD: 362.5 Ohm
RV LEAD THRESHOLD: 0.75 V
TOT-0007: 4
TOT-0008: 0
TOT-0009: 1
TOT-0010: 31
TZAT-0004FASTVT: 8
TZAT-0004SLOWVT: 8
TZAT-0012SLOWVT: 200 ms
TZAT-0013FASTVT: 1
TZAT-0013SLOWVT: 3
TZAT-0018FASTVT: NEGATIVE
TZAT-0018SLOWVT: NEGATIVE
TZAT-0019FASTVT: 7.5 V
TZAT-0019SLOWVT: 7.5 V
TZAT-0020SLOWVT: 1 ms
TZON-0003FASTVT: 280 ms
TZON-0003SLOWVT: 315 ms
TZON-0004FASTVT: 16
TZON-0004SLOWVT: 30
TZON-0005SLOWVT: 6
TZON-0010FASTVT: 80 ms
TZST-0001FASTVT: 2
TZST-0001FASTVT: 3
TZST-0001FASTVT: 4
TZST-0001SLOWVT: 3
TZST-0003FASTVT: 36 J
TZST-0003FASTVT: 36 J
TZST-0003SLOWVT: 36 J
TZST-0003SLOWVT: 36 J

## 2013-04-24 MED ORDER — FUROSEMIDE 40 MG PO TABS: 40.0000 mg | ORAL_TABLET | Freq: Every day | ORAL | Status: DC

## 2013-04-24 NOTE — Assessment & Plan Note (Signed)
The patient has worsening of his symptoms. I've asked the patient to stop taking his hydrochlorothiazide, and start taking Lasix 40 mg daily. In addition he will continue digoxin, carvedilol, and lisinopril. He is encouraged to maintain a low-sodium diet. We will obtain blood work to check his potassium and renal function in a proximally 7 days.

## 2013-04-24 NOTE — Patient Instructions (Addendum)
Your physician recommends that you schedule a follow-up appointment in: 1 year with Dr Ladona Ridgel and Merlin transmission on 07/30/13.  Your physician has recommended you make the following change in your medication:  1. Stop hydrochlorothiazide 2. Start Lasix 40 mg daily  Your physician recommends that you return for lab work next week. BMP

## 2013-04-24 NOTE — Assessment & Plan Note (Signed)
His [employer] single-chamber ICD is working normally. We'll plan to recheck in several months. 

## 2013-04-24 NOTE — Progress Notes (Signed)
HPI [name] returns today for followup. He is a very pleasant 52 year old man with a nonischemic cardiomyopathy, chronic congestive heart failure, status post ICD implantation. In the interim, the patient notes PND and orthopnea. He has not been on a diuretic except for hydrochlorothiazide. He noted that by starting back on his digoxin, his symptoms improved. He denies chest pain or anginal symptoms. No peripheral edema. He has had no ICD shock, and denies syncope. No Known Allergies   Current Outpatient Prescriptions  Medication Sig Dispense Refill  . allopurinol (ZYLOPRIM) 300 MG tablet Take 300 mg by mouth daily.       . carvedilol (COREG) 25 MG tablet TAKE 2 TABLETS TWICE DAILY WITH MEALS  120 tablet  6  . clopidogrel (PLAVIX) 75 MG tablet Take 75 mg by mouth daily.      . digoxin (LANOXIN) 0.125 MG tablet Take 0.125 mg by mouth daily.      Marland Kitchen gabapentin (NEURONTIN) 100 MG capsule Take 100 mg by mouth daily.      Marland Kitchen glimepiride (AMARYL) 4 MG tablet Take 4 mg by mouth daily before breakfast.      . lisinopril (PRINIVIL,ZESTRIL) 20 MG tablet Take 20 mg by mouth daily.      Marland Kitchen omeprazole (PRILOSEC) 40 MG capsule Take 1 capsule (40 mg total) by mouth daily.  30 capsule  6  . ondansetron (ZOFRAN) 8 MG tablet Take by mouth every 8 (eight) hours as needed for nausea.      Marland Kitchen oxyCODONE-acetaminophen (PERCOCET) 10-325 MG per tablet Take 1 tablet by mouth every 6 (six) hours as needed for pain.      Marland Kitchen sucralfate (CARAFATE) 1 G tablet Take 1 g by mouth 4 (four) times daily. Take 1 tablet by mouth 30 mins before meals and bedtime.      . furosemide (LASIX) 40 MG tablet Take 1 tablet (40 mg total) by mouth daily.  30 tablet  12   No current facility-administered medications for this visit.     Past Medical History  Diagnosis Date  . Diabetes mellitus DX: 2010  . Gout   . Hypertension   . CKD (chronic kidney disease), stage II   . Erosive esophagitis     a. per EGD (08/2011), Dr. Karilyn Cota - Erosive  reflux esophagitis improved but not completely healed since previous EGD 3 years ago. Bx showing  ulcerated gatroesophageal junction mucosa. negative for H. pylori  . Nonischemic dilated cardiomyopathy     a. H/O EF as low as 35-40% by LV gram 10/2007;  b. Echo 02/2011 EF 50-55%, inf HK, Gr 1 DD   . DDD (degenerative disc disease), lumbar   . Hearing deficit     a. wear bilateral hearing aides  . AICD (automatic cardioverter/defibrillator) present 11/2007    a. 11/2007 [facility] Current VR - single lead ICD  . Chest pain     a. 10/2007 Cath:  normal Cors.  . WPW (Wolff-Parkinson-White syndrome)     a. s/p RFCA @ [facility] - 1999  . TIA (transient ischemic attack)     July, 2013  . Tobacco abuse, in remission 06/27/2009    Discontinued in 2009      ROS:   All systems reviewed and negative except as noted in the HPI.   Past Surgical History  Procedure Laterality Date  . Back surgery      1995  . Appendectomy    . Cardiac defibrillator placement    . Cholecystectomy    .  Radiofrequency ablation  1999    WPW; performed at [facility]  . Elbow surgery  06/2010  . Cholecystectomy, laparoscopic      11/2007  . Esophagogastroduodenoscopy  03/31/2012    also 08/2011; [name]  . Colonoscopy w/ polypectomy  2009     Family History  Problem Relation Age of Onset  . Heart attack Mother 35  . Hypertension Mother   . Diabetes Mother   . Kidney disease Mother   . Breast cancer Mother   . Heart attack Father 89  . Hypertension Father   . Diabetes Father   . Heart attack Brother 55  . Stroke Maternal Grandmother 80  . Diabetes Brother   . Hypertension Brother   . Stroke Brother      History   Social History  . Marital Status: Married    Spouse Name: N/A    Number of Children: N/A  . Years of Education: N/A   Occupational History  . Worked for [employer] off in 11/11   Social History Main Topics  . Smoking status: Former Smoker -- 35 years    Types: Cigarettes    Quit  date: 07/30/2010  . Smokeless tobacco: Never Used  . Alcohol Use: 0.6 oz/week    1 Cans of beer per week     Comment: used "years ago"  . Drug Use: No  . Sexual Activity: Not on file   Other Topics Concern  . Not on file   Social History Narrative  . No narrative on file     BP 97/72  Pulse 97  Ht 6\' 1"  (1.854 m)  Wt 240 lb 6.4 oz (109.045 kg)  BMI 31.72 kg/m2  Physical Exam:  Well appearing middle-aged man, NAD HEENT: Unremarkable Neck:  7 cm JVD, no thyromegally Back:  No CVA tenderness Lungs:  Clear except for scattered basilar rales, no wheezes, no rhonchi. HEART:  Regular rate rhythm, no murmurs, no rubs, no clicks Abd:  soft, positive bowel sounds, no organomegally, no rebound, no guarding Ext:  2 plus pulses, no edema, no cyanosis, no clubbing Skin:  No rashes no nodules Neuro:  CN II through XII intact, motor grossly intact  DEVICE  Normal device function.  See PaceArt for details.   Assess/Plan:

## 2013-05-08 DIAGNOSIS — I428 Other cardiomyopathies: Secondary | ICD-10-CM | POA: Diagnosis not present

## 2013-05-09 LAB — BASIC METABOLIC PANEL
Calcium: 9.1 mg/dL (ref 8.4–10.5)
Glucose, Bld: 209 mg/dL — ABNORMAL HIGH (ref 70–99)
Sodium: 137 mEq/L (ref 135–145)

## 2013-05-10 DIAGNOSIS — E119 Type 2 diabetes mellitus without complications: Secondary | ICD-10-CM | POA: Diagnosis not present

## 2013-05-10 DIAGNOSIS — E1149 Type 2 diabetes mellitus with other diabetic neurological complication: Secondary | ICD-10-CM | POA: Diagnosis not present

## 2013-05-16 DIAGNOSIS — Z6832 Body mass index (BMI) 32.0-32.9, adult: Secondary | ICD-10-CM | POA: Diagnosis not present

## 2013-05-16 DIAGNOSIS — I2510 Atherosclerotic heart disease of native coronary artery without angina pectoris: Secondary | ICD-10-CM | POA: Diagnosis not present

## 2013-05-16 DIAGNOSIS — G8929 Other chronic pain: Secondary | ICD-10-CM | POA: Diagnosis not present

## 2013-05-16 DIAGNOSIS — I501 Left ventricular failure: Secondary | ICD-10-CM | POA: Diagnosis not present

## 2013-05-30 ENCOUNTER — Other Ambulatory Visit: Payer: Self-pay

## 2013-05-30 ENCOUNTER — Emergency Department (HOSPITAL_COMMUNITY)
Admission: EM | Admit: 2013-05-30 | Discharge: 2013-05-30 | Disposition: A | Discharge status: Home / Self Care | Payer: Medicare Other | Attending: Emergency Medicine | Admitting: Emergency Medicine

## 2013-05-30 ENCOUNTER — Emergency Department (HOSPITAL_COMMUNITY): Payer: Medicare Other

## 2013-05-30 ENCOUNTER — Encounter (HOSPITAL_COMMUNITY): Payer: Self-pay | Admitting: Emergency Medicine

## 2013-05-30 DIAGNOSIS — Z7902 Long term (current) use of antithrombotics/antiplatelets: Secondary | ICD-10-CM | POA: Diagnosis not present

## 2013-05-30 DIAGNOSIS — Z8719 Personal history of other diseases of the digestive system: Secondary | ICD-10-CM | POA: Insufficient documentation

## 2013-05-30 DIAGNOSIS — I129 Hypertensive chronic kidney disease with stage 1 through stage 4 chronic kidney disease, or unspecified chronic kidney disease: Secondary | ICD-10-CM | POA: Diagnosis not present

## 2013-05-30 DIAGNOSIS — Z8739 Personal history of other diseases of the musculoskeletal system and connective tissue: Secondary | ICD-10-CM | POA: Diagnosis not present

## 2013-05-30 DIAGNOSIS — Z8673 Personal history of transient ischemic attack (TIA), and cerebral infarction without residual deficits: Secondary | ICD-10-CM | POA: Insufficient documentation

## 2013-05-30 DIAGNOSIS — M109 Gout, unspecified: Secondary | ICD-10-CM | POA: Insufficient documentation

## 2013-05-30 DIAGNOSIS — R0789 Other chest pain: Secondary | ICD-10-CM | POA: Insufficient documentation

## 2013-05-30 DIAGNOSIS — Z95810 Presence of automatic (implantable) cardiac defibrillator: Secondary | ICD-10-CM | POA: Diagnosis not present

## 2013-05-30 DIAGNOSIS — E119 Type 2 diabetes mellitus without complications: Secondary | ICD-10-CM | POA: Diagnosis not present

## 2013-05-30 DIAGNOSIS — Z8669 Personal history of other diseases of the nervous system and sense organs: Secondary | ICD-10-CM | POA: Insufficient documentation

## 2013-05-30 DIAGNOSIS — Z87891 Personal history of nicotine dependence: Secondary | ICD-10-CM | POA: Diagnosis not present

## 2013-05-30 DIAGNOSIS — N182 Chronic kidney disease, stage 2 (mild): Secondary | ICD-10-CM | POA: Diagnosis not present

## 2013-05-30 DIAGNOSIS — R079 Chest pain, unspecified: Secondary | ICD-10-CM | POA: Diagnosis not present

## 2013-05-30 DIAGNOSIS — Z79899 Other long term (current) drug therapy: Secondary | ICD-10-CM | POA: Insufficient documentation

## 2013-05-30 DIAGNOSIS — I252 Old myocardial infarction: Secondary | ICD-10-CM | POA: Insufficient documentation

## 2013-05-30 DIAGNOSIS — R0602 Shortness of breath: Secondary | ICD-10-CM | POA: Insufficient documentation

## 2013-05-30 DIAGNOSIS — Z6832 Body mass index (BMI) 32.0-32.9, adult: Secondary | ICD-10-CM | POA: Diagnosis not present

## 2013-05-30 LAB — CBC WITH DIFFERENTIAL/PLATELET
Basophils Absolute: 0 10*3/uL (ref 0.0–0.1)
Basophils Relative: 1 % (ref 0–1)
Eosinophils Relative: 2 % (ref 0–5)
HCT: 45.3 % (ref 39.0–52.0)
Hemoglobin: 16.2 g/dL (ref 13.0–17.0)
Lymphocytes Relative: 23 % (ref 12–46)
Lymphs Abs: 1 10*3/uL (ref 0.7–4.0)
MCH: 32.4 pg (ref 26.0–34.0)
Monocytes Absolute: 0.5 10*3/uL (ref 0.1–1.0)
Monocytes Relative: 11 % (ref 3–12)
Neutro Abs: 2.8 10*3/uL (ref 1.7–7.7)
Platelets: 123 10*3/uL — ABNORMAL LOW (ref 150–400)
RBC: 5 MIL/uL (ref 4.22–5.81)
RDW: 14.1 % (ref 11.5–15.5)

## 2013-05-30 LAB — TROPONIN I: Troponin I: <0.3 ng/mL (ref <0.30)

## 2013-05-30 LAB — PRO B NATRIURETIC PEPTIDE: Pro B Natriuretic peptide (BNP): 93.4 pg/mL (ref 0–125)

## 2013-05-30 LAB — COMPREHENSIVE METABOLIC PANEL
ALT: 33 U/L (ref 0–53)
AST: 28 U/L (ref 0–37)
Albumin: 4.4 g/dL (ref 3.5–5.2)
CO2: 26 mEq/L (ref 19–32)
Calcium: 10.5 mg/dL (ref 8.4–10.5)
Creatinine, Ser: 1.51 mg/dL — ABNORMAL HIGH (ref 0.50–1.35)
Sodium: 138 mEq/L (ref 135–145)

## 2013-05-30 MED ORDER — LORAZEPAM 1 MG PO TABS: 0.5000 mg | ORAL_TABLET | Freq: Three times a day (TID) | ORAL | Status: DC | PRN

## 2013-05-30 MED ORDER — HYDROMORPHONE HCL PF 1 MG/ML IJ SOLN
1.0000 mg | Freq: Once | INTRAMUSCULAR | Status: AC
  Administered 2013-05-30: 1 mg via INTRAVENOUS
  Filled 2013-05-30: qty 1

## 2013-05-30 MED ORDER — LORAZEPAM 2 MG/ML IJ SOLN
0.5000 mg | Freq: Once | INTRAMUSCULAR | Status: AC
  Administered 2013-05-30: 0.5 mg via INTRAVENOUS
  Filled 2013-05-30: qty 1

## 2013-05-30 NOTE — Discharge Instructions (Signed)
Follow up with dr. Purvis Sheffield at [facility] cardiology next week. Or follow up with one of his partners

## 2013-05-30 NOTE — ED Provider Notes (Signed)
CSN: [id number]     Arrival date & time 05/30/13  1327 History  This chart was scribed for Mike Lennert, MD by Quintella Reichert, ED scribe.  This patient was seen in room APA07/APA07 and the patient's care was started at 1:56 PM.   Chief Complaint  Patient presents with  . Chest Pain    Patient is a 52 y.o. male presenting with chest pain. The history is provided by the patient and medical records. No language interpreter was used.  Chest Pain Pain quality: pressure   Pain radiates to:  L arm Duration:  14 hours Chronicity:  Recurrent (pt had similar symptoms in August) Relieved by:  Nothing Associated symptoms: shortness of breath   Associated symptoms: no abdominal pain, no back pain, no cough, no fatigue and no headache   Risk factors: diabetes mellitus, hypertension, male sex and smoking (former 35-year smoker)   Risk factors comment:  Prior MI, nonischemic dilated cardiomyopathy, cardiac defibrillator in place   HPI Comments: Mike Floyd is a 52 y.o. male with h/o MI, nonischemic dilated cardiomyopathy, Wolff-Parkinson-White syndrome, DM, and HTN who presents to the Emergency Department complaining of CP that began last night at 12:30 AM, with associated SOB.  Pt describes the pain as a pressure that radiates down the left arm "like someone's squeezing it to death."  He also complains of SOB that is worsened by lying down.  Pt states he had similar symptoms in August when he was seen in the ED and admitted for further evaluation.  Evaluation did not reveal any cardiac sources of pt's symptoms but CXR did reveal pneumonia.  Pt had a MI several years ago and has a defibrillator in place.  He has not had stents placed.  He has not had a stress test "in a while."  PCP is Dr. Sherwood Gambler Cardiologist was Dr. Dietrich Pates who retired last month.  He does not know who his new cardiologist will be. Pt sees Dr. Ladona Ridgel for his defibrillator.   Past Medical History  Diagnosis Date  . Diabetes  mellitus DX: 2010  . Gout   . Hypertension   . CKD (chronic kidney disease), stage II   . Erosive esophagitis     a. per EGD (08/2011), Dr. Karilyn Cota - Erosive reflux esophagitis improved but not completely healed since previous EGD 3 years ago. Bx showing  ulcerated gatroesophageal junction mucosa. negative for H. pylori  . Nonischemic dilated cardiomyopathy     a. H/O EF as low as 35-40% by LV gram 10/2007;  b. Echo 02/2011 EF 50-55%, inf HK, Gr 1 DD   . DDD (degenerative disc disease), lumbar   . Hearing deficit     a. wear bilateral hearing aides  . AICD (automatic cardioverter/defibrillator) present 11/2007    a. 11/2007 [facility] Current VR - single lead ICD  . Chest pain     a. 10/2007 Cath:  normal Cors.  . WPW (Wolff-Parkinson-White syndrome)     a. s/p RFCA @ [facility] - 1999  . TIA (transient ischemic attack)     July, 2013  . Tobacco abuse, in remission 06/27/2009    Discontinued in 2009      Past Surgical History  Procedure Laterality Date  . Back surgery      1995  . Appendectomy    . Cholecystectomy    . Radiofrequency ablation  1999    WPW; performed at [facility]  . Elbow surgery  06/2010  . Cholecystectomy, laparoscopic  11/2007  . Esophagogastroduodenoscopy  03/31/2012    also 08/2011; [name]  . Colonoscopy w/ polypectomy  2009  . Cardiac defibrillator placement      Family History  Problem Relation Age of Onset  . Heart attack Mother 56  . Hypertension Mother   . Diabetes Mother   . Kidney disease Mother   . Breast cancer Mother   . Heart attack Father 64  . Hypertension Father   . Diabetes Father   . Heart attack Brother 55  . Stroke Maternal Grandmother 80  . Diabetes Brother   . Hypertension Brother   . Stroke Brother     History  Substance Use Topics  . Smoking status: Former Smoker -- 35 years    Types: Cigarettes    Quit date: 07/30/2010  . Smokeless tobacco: Never Used  . Alcohol Use: No     Comment: used "years ago"     Review of  Systems  Constitutional: Negative for appetite change and fatigue.  HENT: Negative for congestion, ear discharge and sinus pressure.   Eyes: Negative for discharge.  Respiratory: Positive for shortness of breath. Negative for cough.   Cardiovascular: Positive for chest pain.  Gastrointestinal: Negative for abdominal pain and diarrhea.  Genitourinary: Negative for frequency and hematuria.  Musculoskeletal: Negative for back pain.  Skin: Negative for rash.  Neurological: Negative for seizures and headaches.  Psychiatric/Behavioral: Negative for hallucinations.     Allergies  Review of patient's allergies indicates no known allergies.  Home Medications   Current Outpatient Rx  Name  Route  Sig  Dispense  Refill  . allopurinol (ZYLOPRIM) 300 MG tablet   Oral   Take 300 mg by mouth daily.          . carvedilol (COREG) 25 MG tablet      TAKE 2 TABLETS TWICE DAILY WITH MEALS   120 tablet   6   . clopidogrel (PLAVIX) 75 MG tablet   Oral   Take 75 mg by mouth daily.         . digoxin (LANOXIN) 0.125 MG tablet   Oral   Take 0.125 mg by mouth daily.         . furosemide (LASIX) 40 MG tablet   Oral   Take 20 mg by mouth daily.         Marland Kitchen gabapentin (NEURONTIN) 100 MG capsule   Oral   Take 100 mg by mouth 3 (three) times daily.          Marland Kitchen glimepiride (AMARYL) 4 MG tablet   Oral   Take 4 mg by mouth daily before breakfast.         . lisinopril (PRINIVIL,ZESTRIL) 20 MG tablet   Oral   Take 20 mg by mouth daily.         Marland Kitchen omeprazole (PRILOSEC) 40 MG capsule   Oral   Take 1 capsule (40 mg total) by mouth daily.   30 capsule   6   . oxyCODONE-acetaminophen (PERCOCET) 10-325 MG per tablet   Oral   Take 1 tablet by mouth every 6 (six) hours as needed for pain.         Marland Kitchen sucralfate (CARAFATE) 1 G tablet   Oral   Take 1 g by mouth 4 (four) times daily. Take 1 tablet by mouth 30 mins before meals and bedtime.          BP 103/77  Pulse 87  Temp(Src)  97.8 F (36.6 C) (Oral)  Resp 22  Ht 6\' 1"  (1.854 m)  Wt 243 lb (110.224 kg)  BMI 32.07 kg/m2  SpO2 100%  Physical Exam  Nursing note and vitals reviewed. Constitutional: He is oriented to person, place, and time. He appears well-developed.  HENT:  Head: Normocephalic.  Eyes: Conjunctivae and EOM are normal. No scleral icterus.  Neck: Neck supple. No thyromegaly present.  Cardiovascular: Normal rate, regular rhythm and normal heart sounds.  Exam reveals no gallop and no friction rub.   No murmur heard. Pulmonary/Chest: Effort normal and breath sounds normal. No stridor. No respiratory distress. He has no wheezes. He has no rales. He exhibits no tenderness.  Abdominal: He exhibits no distension. There is no tenderness. There is no rebound.  Musculoskeletal: Normal range of motion. He exhibits no edema.  Lymphadenopathy:    He has no cervical adenopathy.  Neurological: He is oriented to person, place, and time. He exhibits normal muscle tone. Coordination normal.  Skin: No rash noted. No erythema.  Psychiatric: He has a normal mood and affect. His behavior is normal.    ED Course  Procedures (including critical care time)  DIAGNOSTIC STUDIES: Oxygen Saturation is 100% on room air, normal by my interpretation.    COORDINATION OF CARE: 2:00 PM-Discussed treatment plan which includes cardiac workup with pt at bedside and pt agreed to plan.    Labs Review Labs Reviewed  CBC WITH DIFFERENTIAL - Abnormal; Notable for the following:    Platelets 123 (*)    All other components within normal limits  COMPREHENSIVE METABOLIC PANEL - Abnormal; Notable for the following:    Glucose, Bld 117 (*)    Creatinine, Ser 1.51 (*)    GFR calc non Af Amer 51 (*)    GFR calc Af Amer 59 (*)    All other components within normal limits  TROPONIN I  PRO B NATRIURETIC PEPTIDE    Imaging Review Dg Chest 2 View  05/30/2013   CLINICAL DATA:  Chest pain and hypertension  EXAM: CHEST  2 VIEW   COMPARISON:  chest radiograph and chest CT March 15, 2013  FINDINGS: Lungs are clear. Heart size and pulmonary vascularity are normal. No adenopathy. Pacemaker lead is attached to the right ventricle.  There is anterior wedging of the a mid thoracic vertebral body. There is degenerative change in the thoracic spine.  IMPRESSION: No edema or consolidation.   Electronically Signed   By: Bretta Bang M.D.   On: 05/30/2013 15:09    EKG Interpretation   None      Date: 05/30/2013  Rate: 83  Rhythm: normal sinus rhythm  QRS Axis: normal  Intervals: normal  ST/T Wave abnormalities: normal  Conduction Disutrbances:none  Narrative Interpretation:   Old EKG Reviewed: unchanged    MDM  No diagnosis found. Chest pain.  Resolved.  Nl troponins.   Will follow up with card  Mike Lennert, MD 05/30/13 1719

## 2013-05-30 NOTE — ED Notes (Signed)
Chest pain since last night 1230am.  With pain in lt arm,  Sent from MD office.with EKG

## 2013-06-13 DIAGNOSIS — I10 Essential (primary) hypertension: Secondary | ICD-10-CM | POA: Diagnosis not present

## 2013-06-13 DIAGNOSIS — F411 Generalized anxiety disorder: Secondary | ICD-10-CM | POA: Diagnosis not present

## 2013-06-13 DIAGNOSIS — Z6832 Body mass index (BMI) 32.0-32.9, adult: Secondary | ICD-10-CM | POA: Diagnosis not present

## 2013-06-13 DIAGNOSIS — I2510 Atherosclerotic heart disease of native coronary artery without angina pectoris: Secondary | ICD-10-CM | POA: Diagnosis not present

## 2013-06-20 ENCOUNTER — Other Ambulatory Visit: Payer: Self-pay | Admitting: Cardiology

## 2013-07-02 ENCOUNTER — Ambulatory Visit (INDEPENDENT_AMBULATORY_CARE_PROVIDER_SITE_OTHER): Payer: Medicare Other | Admitting: Physician Assistant

## 2013-07-02 ENCOUNTER — Encounter: Payer: Self-pay | Admitting: *Deleted

## 2013-07-02 ENCOUNTER — Encounter: Payer: Self-pay | Admitting: Physician Assistant

## 2013-07-02 VITALS — BP 87/54 | HR 88 | Ht 74.0 in | Wt 247.0 lb

## 2013-07-02 DIAGNOSIS — I10 Essential (primary) hypertension: Secondary | ICD-10-CM

## 2013-07-02 DIAGNOSIS — G4733 Obstructive sleep apnea (adult) (pediatric): Secondary | ICD-10-CM

## 2013-07-02 DIAGNOSIS — I959 Hypotension, unspecified: Secondary | ICD-10-CM

## 2013-07-02 DIAGNOSIS — R0789 Other chest pain: Secondary | ICD-10-CM

## 2013-07-02 DIAGNOSIS — I5022 Chronic systolic (congestive) heart failure: Secondary | ICD-10-CM | POA: Diagnosis not present

## 2013-07-02 DIAGNOSIS — I952 Hypotension due to drugs: Secondary | ICD-10-CM | POA: Insufficient documentation

## 2013-07-02 DIAGNOSIS — I428 Other cardiomyopathies: Secondary | ICD-10-CM | POA: Diagnosis not present

## 2013-07-02 DIAGNOSIS — Z95810 Presence of automatic (implantable) cardiac defibrillator: Secondary | ICD-10-CM

## 2013-07-02 DIAGNOSIS — R079 Chest pain, unspecified: Secondary | ICD-10-CM

## 2013-07-02 MED ORDER — LORAZEPAM 1 MG PO TABS: 0.5000 mg | ORAL_TABLET | Freq: Every day | ORAL | Status: DC

## 2013-07-02 MED ORDER — LISINOPRIL 10 MG PO TABS: ORAL_TABLET | ORAL | Status: DC

## 2013-07-02 NOTE — Assessment & Plan Note (Signed)
Most recent ejection fraction was 50-55% on 2-D echo in 2013. No evidence of heart failure on exam today.

## 2013-07-02 NOTE — Assessment & Plan Note (Signed)
She does not use CPAP because he says it feels like he is smothering when he uses it. He also can't afford to have someone reevaluate.

## 2013-07-02 NOTE — Assessment & Plan Note (Signed)
Patient's blood pressure is on the low side and these haven't some dizziness when he first stands up. I will decrease his lisinopril to 20 mg one half tablet daily. I think the Ativan and Percocet may also be contributing to this. I've asked him to decrease his Ativan to one half tablet daily.

## 2013-07-02 NOTE — Assessment & Plan Note (Signed)
Patient is having trouble transmitting readings remotely. I have talked to [place] in our [place] office. She will call him and troubleshoot this with him later today. He has had no ICD discharges.

## 2013-07-02 NOTE — Patient Instructions (Addendum)
Your physician recommends that you schedule a follow-up appointment in: January with Dr Wyline Mood  Your physician has recommended you make the following change in your medication:  1. Decreased Lisinopril to 10 mg daily 2. Decreased Atavan to Once daily  Your physician has requested that you have a lexiscan myoview. For further information please visit [email or web address] Please follow instruction sheet, as given.

## 2013-07-02 NOTE — Progress Notes (Signed)
HPI:   This is a 52 year old male patient of Dr. Sharrell Ku and Dr. Dietrich Pates who has a history of a nonischemic cardiomyopathy, ICD, history of WPW status post RFCA at [facility] in 1999. He is sent here today by Dr. Sherwood Gambler because of hypotension. Patient's blood pressure has been in the 80s systolic. He does have some dizziness when he first stands up but then it goes away. He denies any presyncope or syncope. Dr. Ladona Ridgel started him on Lasix 20 mg daily and stop his hydrochlorothiazide back in August because of fluid overload.  He's also had a recent emergency room visit with chest heaviness and squeezing in his left arm. MI was ruled out with negative troponins. He is having this 3-4 times a day and lasts anywhere from 5-30 minutes. It eases when he just sits and rests. He was given Ativan in the hospital. He says now he just wants to sleep all the time. He denies exertional chest heaviness. He does not exercise much because of chronic back pain for which he takes Percocet. [place] has a farm which he walks around on and this does not bring on the chest pain. His last stress test was in 2011 and was negative. He had normal coronary arteries on cath in 2003 and 2009. Most recent 2-D echo in 2013 showed an ejection fraction of 50-55%.  The patient has had trouble checking his AICD remotely. Since he's been living in his current home for the past 3 years he's never been able to transmit any readings.   No Known Allergies  Current Outpatient Prescriptions on File Prior to Visit: allopurinol (ZYLOPRIM) 300 MG tablet, Take 300 mg by mouth daily. , Disp: , Rfl:  carvedilol (COREG) 25 MG tablet, TAKE 2 TABLETS TWICE DAILY WITH MEALS, Disp: 120 tablet, Rfl: 6 clopidogrel (PLAVIX) 75 MG tablet, Take 75 mg by mouth daily., Disp: , Rfl:   digoxin (LANOXIN) 0.125 MG tablet, Take 0.125 mg by mouth daily., Disp: , Rfl:  furosemide (LASIX) 40 MG tablet, Take 20 mg by mouth daily., Disp: , Rfl:  gabapentin (NEURONTIN) 100  MG capsule, Take 100 mg by mouth 3 (three) times daily. , Disp: , Rfl:  glimepiride (AMARYL) 4 MG tablet, Take 4 mg by mouth daily before breakfast., Disp: , Rfl:  omeprazole (PRILOSEC) 40 MG capsule, Take 1 capsule (40 mg total) by mouth daily., Disp: 30 capsule, Rfl: 6 oxyCODONE-acetaminophen (PERCOCET) 10-325 MG per tablet, Take 1 tablet by mouth every 6 (six) hours as needed for pain., Disp: , Rfl:  sucralfate (CARAFATE) 1 G tablet, Take 1 g by mouth 4 (four) times daily. Take 1 tablet by mouth 30 mins before meals and bedtime., Disp: , Rfl:   No current facility-administered medications on file prior to visit.   Past Medical History:   Diabetes mellitus                               DX: 2010     Gout                                                         Hypertension  CKD (chronic kidney disease), stage II                       Erosive esophagitis                                            Comment:a. per EGD (08/2011), Dr. Karilyn Cota - Erosive               reflux esophagitis improved but not completely               healed since previous EGD 3 years ago. Bx               showing  ulcerated gatroesophageal junction               mucosa. negative for H. pylori   Nonischemic dilated cardiomyopathy                             Comment:a. H/O EF as low as 35-40% by LV gram 10/2007;                b. Echo 02/2011 EF 50-55%, inf HK, Gr 1 DD    DDD (degenerative disc disease), lumbar                      Hearing deficit                                                Comment:a. wear bilateral hearing aides   AICD (automatic cardioverter/defibrillator) pr* 11/2007        Comment:a. 11/2007 [facility] Current VR - single lead ICD   Chest pain                                                     Comment:a. 10/2007 Cath:  normal Cors.   WPW (Wolff-Parkinson-White syndrome)                           Comment:a. s/p RFCA @ [facility] - 1999   TIA (transient ischemic  attack)                                Comment:July, 2013   Tobacco abuse, in remission                     06/27/2009     Comment:Discontinued in 2009    Past Surgical History:   BACK SURGERY                                                    Comment:1995   APPENDECTOMY  CHOLECYSTECTOMY                                               RADIOFREQUENCY ABLATION                          1999           Comment:WPW; performed at [facility]   ELBOW SURGERY                                    06/2010      CHOLECYSTECTOMY, LAPAROSCOPIC                                   Comment:11/2007   ESOPHAGOGASTRODUODENOSCOPY                       03/31/2012      Comment:also 08/2011; [name]   COLONOSCOPY W/ POLYPECTOMY                       2009         CARDIAC DEFIBRILLATOR PLACEMENT                              Review of patient's family history indicates:   Heart attack                   Mother                   Hypertension                   Mother                   Diabetes                       Mother                   Kidney disease                 Mother                   Breast cancer                  Mother                   Heart attack                   Father                   Hypertension                   Father                   Diabetes                       Father                   Heart attack  Brother                  Stroke                         Maternal Grandmother     Diabetes                       Brother                  Hypertension                   Brother                  Stroke                         Brother                  Social History   Marital Status: Married             Spouse Name:                      Years of Education:                 Number of children:             Occupational History Occupation          Psychiatric nurse              Worked for [employer]*                     Laid  off in 11/11  Social History Main Topics   Smoking Status: Former Smoker                   Packs/Day: 0.00  Years: 35        Types: Cigarettes     Quit date: 07/30/2010   Smokeless Status: Never Used                       Alcohol Use: No                Comment: used "years ago"   Drug Use: No             Sexual Activity: Not on file        Other Topics            Concern   None on file  Social History Narrative   None on file    ROS: Chronic back pain, once to sleep all the time, sleep apnea but does not use his CPAP because it makes him feel like he is smothering. Otherwise see history of present illness   PHYSICAL EXAM: Well-nournished, in no acute distress. Neck: No JVD, HJR, Bruit, or thyroid enlargement  Lungs: No tachypnea, clear without wheezing, rales, or rhonchi  Cardiovascular: RRR, PMI not displaced, heart sounds normal, no murmurs, gallops, bruit, thrill, or heave.  Abdomen: BS normal. Soft without organomegaly, masses, lesions or tenderness.  Extremities: without cyanosis, clubbing or edema. Good distal pulses bilateral  SKin: Warm, no lesions or rashes   Musculoskeletal: No deformities  Neuro: no focal signs  BP 87/54  Pulse 88  Ht 6\' 2"  (1.88 m)  Wt 247 lb (112.038 kg)  BMI 31.70 kg/m2   EKG: Normal sinus rhythm, no acute change   IMPRESSION: Negative stress nuclear myocardial study revealing impaired exercise capacity, no significant stress-induced EKG abnormalities, borderline left ventricular size and low normal left ventricular systolic function.  By scintigraphic imaging, there was diaphragmatic attenuation as well as a minimal apical perfusion abnormality that likely represents exaggerated physiologic apical thinning.  A small area of scarring at the apex cannot be unequivocally excluded.  Other findings as noted.  Provider: Sondra Come, Dillard Essex     Specimen Collected: 01/09/10  2:16 PM   2Decho 2013: - Left ventricle: The  cavity size was normal. Wall thickness   was normal. Systolic function was normal. The estimated   ejection fraction was in the range of 50% to 55%. Wall   motion was normal; there were no regional wall motion   abnormalities. - Aortic valve: Trivial regurgitation. - Mitral valve: Calcified annulus. - Left atrium: The atrium was mildly dilated. Impressions:  - Saline microcavitation study technically difficult but no   obvious shunt

## 2013-07-02 NOTE — Assessment & Plan Note (Signed)
Patient continues to have recurrent chest heaviness down his left arm. He did have a normal cath in 2009. His last stress test was in 2011. He's had multiple emergency room visits for this. We'll order a Lexiscan Myoview to rule out ischemia.

## 2013-07-11 ENCOUNTER — Telehealth: Payer: Self-pay | Admitting: *Deleted

## 2013-07-11 NOTE — Telephone Encounter (Signed)
LMOM for return call in regards to [facility] transmission. Not receiving transmissions.

## 2013-07-13 ENCOUNTER — Other Ambulatory Visit: Payer: Self-pay | Admitting: Cardiology

## 2013-07-13 DIAGNOSIS — G8929 Other chronic pain: Secondary | ICD-10-CM | POA: Diagnosis not present

## 2013-07-13 DIAGNOSIS — Z6832 Body mass index (BMI) 32.0-32.9, adult: Secondary | ICD-10-CM | POA: Diagnosis not present

## 2013-07-16 ENCOUNTER — Encounter (HOSPITAL_COMMUNITY)
Admission: RE | Admit: 2013-07-16 | Discharge: 2013-07-16 | Disposition: A | Discharge status: Home / Self Care | Payer: Medicare Other | Source: Ambulatory Visit | Attending: Physician Assistant | Admitting: Physician Assistant

## 2013-07-16 ENCOUNTER — Encounter (HOSPITAL_COMMUNITY): Payer: Self-pay

## 2013-07-16 DIAGNOSIS — R079 Chest pain, unspecified: Secondary | ICD-10-CM | POA: Insufficient documentation

## 2013-07-16 DIAGNOSIS — R0789 Other chest pain: Secondary | ICD-10-CM

## 2013-07-16 DIAGNOSIS — I259 Chronic ischemic heart disease, unspecified: Secondary | ICD-10-CM | POA: Diagnosis not present

## 2013-07-16 DIAGNOSIS — R9439 Abnormal result of other cardiovascular function study: Secondary | ICD-10-CM | POA: Insufficient documentation

## 2013-07-16 MED ORDER — TECHNETIUM TC 99M SESTAMIBI - CARDIOLITE
30.0000 | Freq: Once | INTRAVENOUS | Status: AC | PRN
  Administered 2013-07-16: 30 via INTRAVENOUS

## 2013-07-16 MED ORDER — REGADENOSON 0.4 MG/5ML IV SOLN
INTRAVENOUS | Status: AC
  Administered 2013-07-16: 0.4 mg via INTRAVENOUS
  Filled 2013-07-16: qty 5

## 2013-07-16 MED ORDER — TECHNETIUM TC 99M SESTAMIBI - CARDIOLITE
10.0000 | Freq: Once | INTRAVENOUS | Status: AC | PRN
  Administered 2013-07-16: 10:00:00 10 via INTRAVENOUS

## 2013-07-16 MED ORDER — SODIUM CHLORIDE 0.9 % IJ SOLN
INTRAMUSCULAR | Status: AC
  Administered 2013-07-16: 10 mL via INTRAVENOUS
  Filled 2013-07-16: qty 10

## 2013-07-16 NOTE — Progress Notes (Signed)
Stress Lab Nurses Notes - [facility]  ALONSO GAPINSKI 07/16/2013 Reason for doing test: Chest Pain Type of test: Marlane Hatcher Nurse performing test: Parke Poisson, RN Nuclear Medicine Tech: Lyndel Pleasure Echo Tech: Not Applicable MD performing test: S. McDowell/K.Lawrence NP Family MD: Sherwood Gambler Test explained and consent signed: yes IV started: 22g jelco, Saline lock flushed, No redness or edema and Saline lock started in radiology Symptoms: Chest pressure & chest pain Treatment/Intervention: None Reason test stopped: protocol completed After recovery IV was: Discontinued via X-ray tech and No redness or edema Patient to return to Nuc. Med at : 13:00 Patient discharged: Home Patient's Condition upon discharge was: stable Comments: During test BP & HR.116/74 & HR 94.  Recovery BP 101/72 & HR 81.  Symptoms resolved in recovery. Erskine Speed T

## 2013-07-30 ENCOUNTER — Encounter: Payer: Self-pay | Admitting: Internal Medicine

## 2013-07-30 ENCOUNTER — Ambulatory Visit (INDEPENDENT_AMBULATORY_CARE_PROVIDER_SITE_OTHER): Payer: Medicare Other | Admitting: *Deleted

## 2013-07-30 DIAGNOSIS — I428 Other cardiomyopathies: Secondary | ICD-10-CM

## 2013-08-04 LAB — MDC_IDC_ENUM_SESS_TYPE_REMOTE
Date Time Interrogation Session: 20141222073239
HighPow Impedance: 74 Ohm
HighPow Impedance: 74 Ohm
Lead Channel Pacing Threshold Pulse Width: 0.5 ms
Lead Channel Sensing Intrinsic Amplitude: 8.8 mV
Lead Channel Setting Pacing Amplitude: 2.5 V
Zone Setting Detection Interval: 250 ms
Zone Setting Detection Interval: 280 ms
Zone Setting Detection Interval: 315 ms

## 2013-08-16 DIAGNOSIS — E119 Type 2 diabetes mellitus without complications: Secondary | ICD-10-CM | POA: Diagnosis not present

## 2013-08-16 DIAGNOSIS — E1149 Type 2 diabetes mellitus with other diabetic neurological complication: Secondary | ICD-10-CM | POA: Diagnosis not present

## 2013-08-17 DIAGNOSIS — Z6832 Body mass index (BMI) 32.0-32.9, adult: Secondary | ICD-10-CM | POA: Diagnosis not present

## 2013-08-17 DIAGNOSIS — N183 Chronic kidney disease, stage 3 unspecified: Secondary | ICD-10-CM | POA: Diagnosis not present

## 2013-08-17 DIAGNOSIS — M940 Chondrocostal junction syndrome [Tietze]: Secondary | ICD-10-CM | POA: Diagnosis not present

## 2013-08-17 DIAGNOSIS — G5770 Causalgia of unspecified lower limb: Secondary | ICD-10-CM | POA: Diagnosis not present

## 2013-08-17 DIAGNOSIS — I2510 Atherosclerotic heart disease of native coronary artery without angina pectoris: Secondary | ICD-10-CM | POA: Diagnosis not present

## 2013-08-17 DIAGNOSIS — E1149 Type 2 diabetes mellitus with other diabetic neurological complication: Secondary | ICD-10-CM | POA: Diagnosis not present

## 2013-08-22 ENCOUNTER — Encounter: Payer: Self-pay | Admitting: *Deleted

## 2013-08-28 ENCOUNTER — Ambulatory Visit (INDEPENDENT_AMBULATORY_CARE_PROVIDER_SITE_OTHER): Payer: Medicare Other | Admitting: Cardiology

## 2013-08-28 ENCOUNTER — Encounter: Payer: Self-pay | Admitting: Cardiology

## 2013-08-28 VITALS — BP 98/55 | HR 90 | Ht 74.0 in | Wt 243.0 lb

## 2013-08-28 DIAGNOSIS — R5381 Other malaise: Secondary | ICD-10-CM | POA: Diagnosis not present

## 2013-08-28 DIAGNOSIS — R079 Chest pain, unspecified: Secondary | ICD-10-CM

## 2013-08-28 DIAGNOSIS — R0609 Other forms of dyspnea: Secondary | ICD-10-CM

## 2013-08-28 DIAGNOSIS — R5383 Other fatigue: Secondary | ICD-10-CM | POA: Diagnosis not present

## 2013-08-28 DIAGNOSIS — R0989 Other specified symptoms and signs involving the circulatory and respiratory systems: Secondary | ICD-10-CM

## 2013-08-28 DIAGNOSIS — R0600 Dyspnea, unspecified: Secondary | ICD-10-CM

## 2013-08-28 MED ORDER — RANOLAZINE ER 500 MG PO TB12: 500.0000 mg | ORAL_TABLET | Freq: Two times a day (BID) | ORAL | Status: DC

## 2013-08-28 MED ORDER — CARVEDILOL 25 MG PO TABS: 25.0000 mg | ORAL_TABLET | Freq: Two times a day (BID) | ORAL | Status: DC

## 2013-08-28 NOTE — Patient Instructions (Addendum)
Your physician recommends that you schedule a follow-up appointment in: 3 months  Your physician has recommended you make the following change in your medication:   1) START TAKING RENEXA 500MG  TWICE DAILY 2) DECREASE COREG 25MG  TWICE DAILY

## 2013-08-28 NOTE — Progress Notes (Signed)
Clinical Summary Mike Floyd is a 53 y.o.male last seen by PA Lenze, this is our first visit together. He is seen for the following medical problems.  1. NICM - previously significant LV systolic function, since that time function has improved. LVEF by echo Nov 2014 50-55%. He has an AICD followed by EP - notes some SOB with activities. Can walk less than 1 block because of SOB and chest pain, stable for several years. Feels like chest pain is getting worst. Describes a heaviness that is constant all day long, worst with position and deep breaths. Can be worst with activity.   2. WPW - followed by EP Dr Lovena Le, s/p RFCA in 1999 at [facility] - denies any palpitations  3. Hypotension - SBPs during last visit 06/2013 noted to be in the 80s. Patient did report some dizziness at that time with standing. No syncope.  4. Chest pain  - Can walk less than 1 block because of SOB and chest pain, stable for several years. Feels like chest pain is getting worst. Describes a heaviness that is constant all day long, worst with position and deep breaths. Ongoing symptoms for a few months. Can be associated with palpitations. Note no events noted on last ICD check 08/04/13. - noromal coronaries by cath 2003 and 2009, stress test in 2011 showed no ischemia - last visit a Lexiscan MPI was ordered, possible small area of scar with unclear reversibility, overall low risk  5. OSA - not using CPAP, reports was too uncomfortable and had a difficult time paying for it. Followed by Dr Gerarda Fraction   Past Medical History  Diagnosis Date  . Diabetes mellitus DX: 2010  . Gout   . Hypertension   . CKD (chronic kidney disease), stage II   . Erosive esophagitis     a. per EGD (08/2011), Dr. Laural Golden - Erosive reflux esophagitis improved but not completely healed since previous EGD 3 years ago. Bx showing  ulcerated gatroesophageal junction mucosa. negative for H. pylori  . Nonischemic dilated cardiomyopathy     a. H/O EF  as low as 35-40% by LV gram 10/2007;  b. Echo 02/2011 EF 50-55%, inf HK, Gr 1 DD   . DDD (degenerative disc disease), lumbar   . Hearing deficit     a. wear bilateral hearing aides  . AICD (automatic cardioverter/defibrillator) present 11/2007    a. 11/2007 [facility] Current VR - single lead ICD  . Chest pain     a. 10/2007 Cath:  normal Cors.  . WPW (Wolff-Parkinson-White syndrome)     a. s/p RFCA @ [facility]  . TIA (transient ischemic attack)     July, 2013  . Tobacco abuse, in remission 06/27/2009    Discontinued in 2009       No Known Allergies   Current Outpatient Prescriptions  Medication Sig Dispense Refill  . allopurinol (ZYLOPRIM) 300 MG tablet Take 300 mg by mouth daily.       . carvedilol (COREG) 25 MG tablet TAKE 2 TABLETS TWICE DAILY WITH MEALS  120 tablet  6  . clopidogrel (PLAVIX) 75 MG tablet Take 75 mg by mouth daily.      . digoxin (LANOXIN) 0.125 MG tablet Take 0.125 mg by mouth daily.      . furosemide (LASIX) 40 MG tablet Take 20 mg by mouth daily.      Marland Kitchen gabapentin (NEURONTIN) 100 MG capsule Take 100 mg by mouth 3 (three) times daily.       Marland Kitchen  glimepiride (AMARYL) 4 MG tablet Take 4 mg by mouth daily before breakfast.      . lisinopril (PRINIVIL,ZESTRIL) 10 MG tablet TAKE ONE TABLET DAILY.  30 tablet  6  . LORazepam (ATIVAN) 1 MG tablet Take 0.5 tablets (0.5 mg total) by mouth daily.  15 tablet  0  . omeprazole (PRILOSEC) 40 MG capsule TAKE (1) CAPSULE BY MOUTH ONCE DAILY.  30 capsule  6  . oxyCODONE-acetaminophen (PERCOCET) 10-325 MG per tablet Take 1 tablet by mouth every 6 (six) hours as needed for pain.      Marland Kitchen sucralfate (CARAFATE) 1 G tablet Take 1 g by mouth 4 (four) times daily. Take 1 tablet by mouth 30 mins before meals and bedtime.       No current facility-administered medications for this visit.     Past Surgical History  Procedure Laterality Date  . Back surgery      1995  . Appendectomy    . Cholecystectomy    . Radiofrequency ablation  1999      WPW; performed at [facility]  . Elbow surgery  06/2010  . Cholecystectomy, laparoscopic      11/2007  . Esophagogastroduodenoscopy  03/31/2012    also 08/2011; [name]  . Colonoscopy w/ polypectomy  2009  . Cardiac defibrillator placement       No Known Allergies    Family History  Problem Relation Age of Onset  . Heart attack Mother 109  . Hypertension Mother   . Diabetes Mother   . Kidney disease Mother   . Breast cancer Mother   . Heart attack Father 78  . Hypertension Father   . Diabetes Father   . Heart attack Brother 33  . Stroke Maternal Grandmother 80  . Diabetes Brother   . Hypertension Brother   . Stroke Brother      Social History Mr. Fawcett reports that he quit smoking about 3 years ago. His smoking use included Cigarettes. He smoked 0.00 packs per day for 35 years. He has never used smokeless tobacco. Mr. Gianino reports that he does not drink alcohol.   Review of Systems CONSTITUTIONAL: No weight loss, fever, chills, weakness or fatigue.  HEENT: Eyes: No visual loss, blurred vision, double vision or yellow sclerae.No hearing loss, sneezing, congestion, runny nose or sore throat.  SKIN: No rash or itching.  CARDIOVASCULAR: per HPI RESPIRATORY: No shortness of breath, cough or sputum.  GASTROINTESTINAL: No anorexia, nausea, vomiting or diarrhea. No abdominal pain or blood.  GENITOURINARY: No burning on urination, no polyuria NEUROLOGICAL: No headache, dizziness, syncope, paralysis, ataxia, numbness or tingling in the extremities. No change in bowel or bladder control.  MUSCULOSKELETAL: No muscle, back pain, joint pain or stiffness.  LYMPHATICS: No enlarged nodes. No history of splenectomy.  PSYCHIATRIC: No history of depression or anxiety.  ENDOCRINOLOGIC: No reports of sweating, cold or heat intolerance. No polyuria or polydipsia.  Marland Kitchen   Physical Examination p 90 bp 95/55 Wt 243 lbs BMI 31 Gen: resting comfortably, no acute distress HEENT: no scleral  icterus, pupils equal round and reactive, no palptable cervical adenopathy,  CV: RRR, no m/r/g, no JVD, no carotid bruits Resp: Clear to auscultation bilaterally GI: abdomen is soft, non-tender, non-distended, normal bowel sounds, no hepatosplenomegaly MSK: extremities are warm, no edema. Mild tenderness to palpation of chest wall.  Skin: warm, no rash Neuro:  no focal deficits Psych: appropriate affect   Diagnostic Studies 01/2010 MPI Negative stress nuclear myocardial study revealing impaired exercise capacity, no  significant stress-induced EKG abnormalities, borderline left ventricular size and low normal left ventricular systolic function. By scintigraphic imaging, there was diaphragmatic attenuation as well as a minimal apical perfusion abnormality that likely represents exaggerated physiologic apical thinning. A small area of scarring at the apex cannot be unequivocally excluded. Other findings as noted.   03/2012 Echo LVEF 50-55%, no WMAs  06/2013 MPI Abnormal but low risk Lexiscan Cardiolite. There were no clearly diagnostic ST segment abnormalities or arrhythmias. Perfusion imaging shows possible scar affecting the anteroseptal apical wall and inferior septal wall, both small defects, question of reversibility at the base of the inferior septal wall. While not excluding a region of scar with minor peri-infarct ischemia, this could also be reflective of abnormal septal motion in the setting of nonischemic cardiomyopathy, ICD in place. LVEF is 62% with normal volumes, possible basal inferior hypokinesis.   Assessment and Plan  1. NICM - now with normalized LVEF, describes symptoms of SOB at 1 block that are stable. He appears euvolemic today. - in setting of fatigue and soft blood pressure will decrease coreg from 50mg  bid to 25 mg bid.   2. Chest pain - atypical symptoms for cardiac chest pain, it is also somewhat reproducible on exam. Overall low risk MPI 06/2013,  question of possible small scar, unclear if any reversibility - will try ranexa as antianginal and follow symptoms - asked patient to follow up with his GI doctor as symptoms concerning for recurrence of his erosive gastritis  3. Fatigue -very likely related to OSA and not using CPAP, he is visibly sleepy and nodding off during our visit - in setting of fatigue and soft bp,will decrease coreg to 25mg  bid.  4. WPW - no current symptoms, prior ablation. Continue regular follow up with EP.    Follow up 3 months    Arnoldo Lenis, M.D., F.A.C.C.

## 2013-09-11 DIAGNOSIS — J0100 Acute maxillary sinusitis, unspecified: Secondary | ICD-10-CM | POA: Diagnosis not present

## 2013-09-11 DIAGNOSIS — Z6831 Body mass index (BMI) 31.0-31.9, adult: Secondary | ICD-10-CM | POA: Diagnosis not present

## 2013-09-11 DIAGNOSIS — I10 Essential (primary) hypertension: Secondary | ICD-10-CM | POA: Diagnosis not present

## 2013-09-11 DIAGNOSIS — B999 Unspecified infectious disease: Secondary | ICD-10-CM | POA: Diagnosis not present

## 2013-09-11 DIAGNOSIS — J9801 Acute bronchospasm: Secondary | ICD-10-CM | POA: Diagnosis not present

## 2013-10-31 ENCOUNTER — Ambulatory Visit (INDEPENDENT_AMBULATORY_CARE_PROVIDER_SITE_OTHER): Payer: Medicare Other | Admitting: *Deleted

## 2013-10-31 DIAGNOSIS — I428 Other cardiomyopathies: Secondary | ICD-10-CM | POA: Diagnosis not present

## 2013-10-31 DIAGNOSIS — I5022 Chronic systolic (congestive) heart failure: Secondary | ICD-10-CM | POA: Diagnosis not present

## 2013-11-13 LAB — MDC_IDC_ENUM_SESS_TYPE_REMOTE
Battery Remaining Longevity: 41 mo
Battery Voltage: 2.6 V
Brady Statistic RV Percent Paced: 1 %
HIGH POWER IMPEDANCE MEASURED VALUE: 83 Ohm
HighPow Impedance: 83 Ohm
Implantable Pulse Generator Serial Number: 536141
Lead Channel Impedance Value: 400 Ohm
Lead Channel Pacing Threshold Amplitude: 0.75 V
Lead Channel Sensing Intrinsic Amplitude: 9.3 mV
MDC IDC MSMT LEADCHNL RV PACING THRESHOLD PULSEWIDTH: 0.5 ms
MDC IDC SESS DTM: 20150325060539
MDC IDC SET LEADCHNL RV PACING AMPLITUDE: 2.5 V
MDC IDC SET LEADCHNL RV PACING PULSEWIDTH: 0.5 ms
MDC IDC SET LEADCHNL RV SENSING SENSITIVITY: 0.3 mV
MDC IDC SET ZONE DETECTION INTERVAL: 315 ms
Zone Setting Detection Interval: 250 ms
Zone Setting Detection Interval: 280 ms

## 2013-11-15 DIAGNOSIS — E119 Type 2 diabetes mellitus without complications: Secondary | ICD-10-CM | POA: Diagnosis not present

## 2013-11-15 DIAGNOSIS — E1149 Type 2 diabetes mellitus with other diabetic neurological complication: Secondary | ICD-10-CM | POA: Diagnosis not present

## 2013-11-22 ENCOUNTER — Encounter: Payer: Self-pay | Admitting: *Deleted

## 2013-12-04 ENCOUNTER — Encounter: Payer: Self-pay | Admitting: Internal Medicine

## 2013-12-04 DIAGNOSIS — I10 Essential (primary) hypertension: Secondary | ICD-10-CM | POA: Diagnosis not present

## 2013-12-04 DIAGNOSIS — Z125 Encounter for screening for malignant neoplasm of prostate: Secondary | ICD-10-CM | POA: Diagnosis not present

## 2013-12-04 DIAGNOSIS — Z6832 Body mass index (BMI) 32.0-32.9, adult: Secondary | ICD-10-CM | POA: Diagnosis not present

## 2013-12-04 DIAGNOSIS — Z79899 Other long term (current) drug therapy: Secondary | ICD-10-CM | POA: Diagnosis not present

## 2013-12-04 DIAGNOSIS — G8929 Other chronic pain: Secondary | ICD-10-CM | POA: Diagnosis not present

## 2013-12-04 DIAGNOSIS — E1149 Type 2 diabetes mellitus with other diabetic neurological complication: Secondary | ICD-10-CM | POA: Diagnosis not present

## 2013-12-21 ENCOUNTER — Ambulatory Visit (INDEPENDENT_AMBULATORY_CARE_PROVIDER_SITE_OTHER): Payer: Medicare Other | Admitting: Cardiovascular Disease

## 2013-12-21 ENCOUNTER — Encounter: Payer: Self-pay | Admitting: Cardiovascular Disease

## 2013-12-21 VITALS — BP 99/65 | HR 87 | Ht 74.0 in | Wt 242.0 lb

## 2013-12-21 DIAGNOSIS — R5381 Other malaise: Secondary | ICD-10-CM | POA: Diagnosis not present

## 2013-12-21 DIAGNOSIS — K2210 Ulcer of esophagus without bleeding: Secondary | ICD-10-CM

## 2013-12-21 DIAGNOSIS — I428 Other cardiomyopathies: Secondary | ICD-10-CM

## 2013-12-21 DIAGNOSIS — K219 Gastro-esophageal reflux disease without esophagitis: Secondary | ICD-10-CM

## 2013-12-21 DIAGNOSIS — Z95810 Presence of automatic (implantable) cardiac defibrillator: Secondary | ICD-10-CM

## 2013-12-21 DIAGNOSIS — R0609 Other forms of dyspnea: Secondary | ICD-10-CM

## 2013-12-21 DIAGNOSIS — R079 Chest pain, unspecified: Secondary | ICD-10-CM | POA: Diagnosis not present

## 2013-12-21 DIAGNOSIS — R0600 Dyspnea, unspecified: Secondary | ICD-10-CM

## 2013-12-21 DIAGNOSIS — I959 Hypotension, unspecified: Secondary | ICD-10-CM

## 2013-12-21 DIAGNOSIS — G4733 Obstructive sleep apnea (adult) (pediatric): Secondary | ICD-10-CM

## 2013-12-21 DIAGNOSIS — F17200 Nicotine dependence, unspecified, uncomplicated: Secondary | ICD-10-CM

## 2013-12-21 DIAGNOSIS — K208 Other esophagitis without bleeding: Secondary | ICD-10-CM

## 2013-12-21 DIAGNOSIS — R0989 Other specified symptoms and signs involving the circulatory and respiratory systems: Secondary | ICD-10-CM

## 2013-12-21 DIAGNOSIS — R5383 Other fatigue: Secondary | ICD-10-CM | POA: Diagnosis not present

## 2013-12-21 MED ORDER — ISOSORBIDE MONONITRATE ER 30 MG PO TB24: 30.0000 mg | ORAL_TABLET | Freq: Every day | ORAL | Status: DC

## 2013-12-21 NOTE — Progress Notes (Signed)
Patient ID: Mike Floyd, male   DOB: [birth year], 53 y.o.   MRN: [id number]      SUBJECTIVE: The patient is a 53 year old male who is seen by my colleague, Dr. Harl Bowie, earlier this year. He has a history of a nonischemic myopathy although left ventricular systolic function has since improved with normal EF in November 2014, 50-55%. He has an AICD and is followed by EP. For several years, he has only been able to walk less than one block due to shortness of breath and chest pain. The chest pain is worse with position and deep breaths, but is worst with activity. He also has a history of WPW and is followed by Dr. Lovena Le (EP), and underwent radiofrequency ablation in 1999 at [facility]. He denies any palpitations. Of note, he had normal coronary arteries by cardiac catheterization in 2003 and 2009. His most recent [place] nuclear stress test showed a possible small area of scar with unclear reversibility, it was deemed an overall low risk study.  He also has a history of erosive esophagitis and has seen Dr. Laural Golden in the past.  The patient believes his chest pain has gotten worse and can occur both with and without exertion. He also breaks out in a sweat whenever it occurs. He has not been using CPAP and continues to experience daytime somnolence and fatigue.   No Known Allergies  Current Outpatient Prescriptions  Medication Sig Dispense Refill  . allopurinol (ZYLOPRIM) 300 MG tablet Take 300 mg by mouth daily.       Marland Kitchen amitriptyline (ELAVIL) 50 MG tablet Take 1 tablet by mouth at bedtime.      . carvedilol (COREG) 25 MG tablet Take 1 tablet (25 mg total) by mouth 2 (two) times daily with a meal.  60 tablet  6  . clopidogrel (PLAVIX) 75 MG tablet Take 75 mg by mouth daily.      . digoxin (LANOXIN) 0.125 MG tablet Take 0.125 mg by mouth daily.      . furosemide (LASIX) 40 MG tablet Take 20 mg by mouth daily.      Marland Kitchen glimepiride (AMARYL) 4 MG tablet Take 4 mg by mouth daily before breakfast.       . L-Methylfolate-B6-B12 (FOLTANX) 3-35-2 MG TABS Take 1 tablet by mouth 2 (two) times daily.      Marland Kitchen lisinopril (PRINIVIL,ZESTRIL) 10 MG tablet TAKE ONE TABLET DAILY.  30 tablet  6  . LORazepam (ATIVAN) 1 MG tablet Take 1 mg by mouth at bedtime.      Marland Kitchen omeprazole (PRILOSEC) 40 MG capsule TAKE (1) CAPSULE BY MOUTH ONCE DAILY.  30 capsule  6  . oxycodone (ROXICODONE) 30 MG immediate release tablet Take 1 tablet by mouth 4 (four) times daily as needed.      . potassium gluconate 595 MG TABS tablet Take 595 mg by mouth daily.      . sucralfate (CARAFATE) 1 G tablet Take 1 g by mouth 4 (four) times daily. Take 1 tablet by mouth 30 mins before meals and bedtime.      . nitroGLYCERIN (NITROSTAT) 0.4 MG SL tablet Place 0.4 mg under the tongue every 5 (five) minutes as needed for chest pain.       No current facility-administered medications for this visit.    Past Medical History  Diagnosis Date  . Diabetes mellitus DX: 2010  . Gout   . Hypertension   . CKD (chronic kidney disease), stage II   . Erosive esophagitis  a. per EGD (08/2011), Dr. Laural Golden - Erosive reflux esophagitis improved but not completely healed since previous EGD 3 years ago. Bx showing  ulcerated gatroesophageal junction mucosa. negative for H. pylori  . Nonischemic dilated cardiomyopathy     a. H/O EF as low as 35-40% by LV gram 10/2007;  b. Echo 02/2011 EF 50-55%, inf HK, Gr 1 DD   . DDD (degenerative disc disease), lumbar   . Hearing deficit     a. wear bilateral hearing aides  . AICD (automatic cardioverter/defibrillator) present 11/2007    a. 11/2007 [facility] Current VR - single lead ICD  . Chest pain     a. 10/2007 Cath:  normal Cors.  . WPW (Wolff-Parkinson-White syndrome)     a. s/p RFCA @ [place]  . TIA (transient ischemic attack)     July, 2013  . Tobacco abuse, in remission 06/27/2009    Discontinued in 2009      Past Surgical History  Procedure Laterality Date  . Back surgery      1995  .  Appendectomy    . Cholecystectomy    . Radiofrequency ablation  1999    WPW; performed at [facility]  . Elbow surgery  06/2010  . Cholecystectomy, laparoscopic      11/2007  . Esophagogastroduodenoscopy  03/31/2012    also 08/2011; [name]  . Colonoscopy w/ polypectomy  2009  . Cardiac defibrillator placement      History   Social History  . Marital Status: Married    Spouse Name: N/A    Number of Children: N/A  . Years of Education: N/A   Occupational History  . Worked for [employer] off in 11/11   Social History Main Topics  . Smoking status: Former Smoker -- 35 years    Types: Cigarettes    Quit date: 07/30/2010  . Smokeless tobacco: Never Used  . Alcohol Use: No     Comment: used "years ago"  . Drug Use: No  . Sexual Activity: Not on file   Other Topics Concern  . Not on file   Social History Narrative  . No narrative on file     Filed Vitals:   12/21/13 1444  BP: 99/65  Pulse: 87  Height: 6\' 2"  (1.88 m)  Weight: 242 lb (109.77 kg)    PHYSICAL EXAM General: NAD Neck: No JVD, no thyromegaly. Lungs: Clear to auscultation bilaterally with normal respiratory effort. CV: Nondisplaced PMI.  Regular rate and rhythm, normal S1/S2, no S3/S4, no murmur. No pretibial or periankle edema.  No carotid bruit.  Normal pedal pulses.  Abdomen: Soft, nontender, no hepatosplenomegaly, no distention.  Neurologic: Alert and oriented x 3.  Psych: Normal affect. Extremities: No clubbing or cyanosis.   ECG: reviewed and available in electronic records.    06/2013 MPI  Abnormal but low risk Lexiscan Cardiolite. There were no clearly diagnostic ST segment abnormalities or arrhythmias. Perfusion imaging shows possible scar affecting the anteroseptal apical wall and inferior septal wall, both small defects, question of reversibility at the base of the inferior septal wall. While not excluding a region of scar with minor peri-infarct ischemia, this could also be  reflective of abnormal septal motion in the setting of nonischemic cardiomyopathy, ICD in place. LVEF is 62% with normal volumes, possible basal inferior hypokinesis.    Assessment and Plan  1. NICM: now with normalized LVEF. Has stable dyspnea with exertion. He appears euvolemic today. Continue current dose of Coreg.  BP is low normal. 2. Chest pain: Atypical symptoms for cardiac chest pain. Overall low risk MPI 06/2013, question of possible small scar, unclear if any reversibility. He appears to have stopped taking Ranexa. I will prescribe Imdur 30 mg daily. I have asked the patient to follow up with his GI doctor as symptoms are concerning for recurrence of his erosive esophagitis.  3. Fatigue: Very likely related to OSA and not using CPAP. 4. WPW: No current symptoms, prior ablation. Continue regular follow up with EP.   Follow up with Dr. Harl Bowie in 3 months.   Kate Sable, M.D., F.A.C.C.

## 2013-12-21 NOTE — Patient Instructions (Signed)
   Begin Imdur 30mg  daily - new sent to pharm Continue all other medications.   Referral to Dr. Laural Golden for erosive esophagitis Follow up in  3 months with Dr. Harl Bowie

## 2013-12-27 ENCOUNTER — Ambulatory Visit (INDEPENDENT_AMBULATORY_CARE_PROVIDER_SITE_OTHER): Payer: Medicare Other | Admitting: Internal Medicine

## 2013-12-27 ENCOUNTER — Encounter (INDEPENDENT_AMBULATORY_CARE_PROVIDER_SITE_OTHER): Payer: Self-pay | Admitting: Internal Medicine

## 2013-12-27 VITALS — BP 104/84 | HR 96 | Temp 98.0°F | Ht 74.0 in | Wt 243.7 lb

## 2013-12-27 DIAGNOSIS — K208 Other esophagitis without bleeding: Secondary | ICD-10-CM

## 2013-12-27 DIAGNOSIS — K219 Gastro-esophageal reflux disease without esophagitis: Secondary | ICD-10-CM

## 2013-12-27 DIAGNOSIS — K2210 Ulcer of esophagus without bleeding: Secondary | ICD-10-CM | POA: Insufficient documentation

## 2013-12-27 DIAGNOSIS — R079 Chest pain, unspecified: Secondary | ICD-10-CM | POA: Diagnosis not present

## 2013-12-27 MED ORDER — PANTOPRAZOLE SODIUM 40 MG PO TBEC: 40.0000 mg | DELAYED_RELEASE_TABLET | Freq: Every day | ORAL | Status: DC

## 2013-12-27 NOTE — Progress Notes (Signed)
Subjective:     Patient ID: Mike Floyd, male   DOB: 1961/03/16, 53 y.o.   MRN: [id number]  HPI Presents today with c/o of chest pain. He has had chest pain for a couple months. Sometimes the pain becomes worse randomly. Sometimes it feels like somebody hit him with a fist. When this occurs, he breaks out in a sweat. The pain will last 10 minutes. He will put a wet rag on his forehead for the pain to "cool down".  Sometimes he has to take a pain pill. The pain is not related to eating. Pain occurs about every day. He has exertional SOB and chest pain.  Presently taking Carafate and Omeprazole for his erosive esophagitis.  Saw his Cardiologist Dr. Bronson Ing with [facility] in [place] on the 15th for this pain. Exam was normal per patient. EKG was normal.. He was advised to follow up with our office. He says he can eat about anything he wants without any problems. No dysphagia.  BMs are normal. Usually has one a day and sometimes 2-3 a day.   07/02/2013 MYOCARDIAL IMAGING WITH SPECT (REST AND PHARMACOLOGIC-STRESS)  GATED LEFT VENTRICULAR WALL MOTION STUDY  LEFT VENTRICULAR EJECTION FRACTION  IMPRESSION:  Abnormal but low risk Lexiscan Cardiolite. There were no clearly  diagnostic ST segment abnormalities or arrhythmias. Perfusion  imaging shows possible scar affecting the anteroseptal apical wall  and inferior septal wall, both small defects, question of  reversibility at the base of the inferior septal wall. While not  excluding a region of scar with minor peri-infarct ischemia, this  could also be reflective of abnormal septal motion in the setting of  nonischemic cardiomyopathy, ICD in place. LVEF is 62% with normal  volumes, possible basal inferior hypokinesis.     03/31/2012: EGD: Off PPI due to inability ot pay.  Dr. Laural Golden  Erosive/ulcerative reflux esophagitis which has gotten worse since last EGD of January 2013.   08/11/2011 EGD/ED: Dr. Laural Golden: Complications: None   Impression:  Erosive reflux esophagitis improved but not completely healed since previous EGD 3 years ago.  3 cm size hiatal hernia.  Esophagus dilated by passing 56 [place] dilator.  Biopsy taken from small patch of salmon-colored mucosa at [facility]. Biopsy:  Shows some ulcerative esophagitis but no evidence of Barrett's.    Review of Systems see hpi . Past Medical History  Diagnosis Date  . Diabetes mellitus DX: 2010  . Gout   . Hypertension   . CKD (chronic kidney disease), stage II   . Erosive esophagitis     a. per EGD (08/2011), Dr. Laural Golden - Erosive reflux esophagitis improved but not completely healed since previous EGD 3 years ago. Bx showing  ulcerated gatroesophageal junction mucosa. negative for H. pylori  . Nonischemic dilated cardiomyopathy     a. H/O EF as low as 35-40% by LV gram 10/2007;  b. Echo 02/2011 EF 50-55%, inf HK, Gr 1 DD   . DDD (degenerative disc disease), lumbar   . Hearing deficit     a. wear bilateral hearing aides  . AICD (automatic cardioverter/defibrillator) present 11/2007    a. 11/2007 [facility] Current VR - single lead ICD  . Chest pain     a. 10/2007 Cath:  normal Cors.  . WPW (Wolff-Parkinson-White syndrome)     a. s/p RFCA @ [place]  . TIA (transient ischemic attack)     July, 2013  . Tobacco abuse, in remission 06/27/2009    Discontinued in 2009  Past Surgical History  Procedure Laterality Date  . Back surgery      1995  . Appendectomy    . Cholecystectomy    . Radiofrequency ablation  1999    WPW; performed at [facility]  . Elbow surgery  06/2010  . Cholecystectomy, laparoscopic      11/2007  . Esophagogastroduodenoscopy  03/31/2012    also 08/2011; [name]  . Colonoscopy w/ polypectomy  2009  . Cardiac defibrillator placement      No Known Allergies  Current Outpatient Prescriptions on File Prior to Visit  Medication Sig Dispense Refill  . allopurinol (ZYLOPRIM) 300 MG tablet Take 300 mg by mouth daily.       Marland Kitchen  amitriptyline (ELAVIL) 50 MG tablet Take 1 tablet by mouth at bedtime.      . carvedilol (COREG) 25 MG tablet Take 1 tablet (25 mg total) by mouth 2 (two) times daily with a meal.  60 tablet  6  . clopidogrel (PLAVIX) 75 MG tablet Take 75 mg by mouth daily.      . digoxin (LANOXIN) 0.125 MG tablet Take 0.125 mg by mouth daily.      . furosemide (LASIX) 40 MG tablet Take 20 mg by mouth daily.      Marland Kitchen glimepiride (AMARYL) 4 MG tablet Take 4 mg by mouth daily before breakfast.      . isosorbide mononitrate (IMDUR) 30 MG 24 hr tablet Take 1 tablet (30 mg total) by mouth daily.  30 tablet  6  . L-Methylfolate-B6-B12 (FOLTANX) 3-35-2 MG TABS Take 1 tablet by mouth 2 (two) times daily.      Marland Kitchen lisinopril (PRINIVIL,ZESTRIL) 10 MG tablet TAKE ONE TABLET DAILY.  30 tablet  6  . LORazepam (ATIVAN) 1 MG tablet Take 1 mg by mouth at bedtime.      Marland Kitchen omeprazole (PRILOSEC) 40 MG capsule TAKE (1) CAPSULE BY MOUTH ONCE DAILY.  30 capsule  6  . oxycodone (ROXICODONE) 30 MG immediate release tablet Take 1 tablet by mouth 4 (four) times daily as needed.      . potassium gluconate 595 MG TABS tablet Take 595 mg by mouth daily.      . sucralfate (CARAFATE) 1 G tablet Take 1 g by mouth 4 (four) times daily. Take 1 tablet by mouth 30 mins before meals and bedtime.       No current facility-administered medications on file prior to visit.   Disabled, Married. No children.       Objective:   Physical Exam  Filed Vitals:   12/27/13 1402  BP: 104/84  Pulse: 96  Temp: 98 F (36.7 C)  Height: 6\' 2"  (1.88 m)  Weight: 243 lb 11.2 oz (110.542 kg)   Alert and oriented. Skin warm and dry. Oral mucosa is moist.   . Sclera anicteric, conjunctivae is pink. Thyroid not enlarged. No cervical lymphadenopathy. Lungs clear. Heart regular rate and rhythm.  Abdomen is soft. Bowel sounds are positive. No hepatomegaly. No abdominal masses felt. No tenderness.  No edema to lower extremities.        Assessment:    Chest pain,   Cardiac exam was normal on the 15th.  On exam today, it does not sound like GI in nature. I discussed this case with Dr. Laural Golden.    Plan:     LFTs today, Will give Rx for Protonix 40mg . He will need to separate from the Plavix. I did not prescribe Dexilant, I do not think his insurance will  pay for it. We did not have any samples of Dexilant. Will  bring him back for an OV in 2 weeks. Further recommendations to follow. I will advise patient the next time he has chest pain and has diaphoresis, to go to the Emergency Dept.

## 2013-12-27 NOTE — Patient Instructions (Signed)
OV in 2 weeks. Dexilant x 2 weeks samples. If you have further chest pain, go to the ED.

## 2013-12-28 LAB — HEPATIC FUNCTION PANEL
ALT: 43 U/L (ref 0–53)
AST: 28 U/L (ref 0–37)
Albumin: 4.6 g/dL (ref 3.5–5.2)
Alkaline Phosphatase: 112 U/L (ref 39–117)
BILIRUBIN INDIRECT: 0.7 mg/dL (ref 0.2–1.2)
Bilirubin, Direct: 0.2 mg/dL (ref 0.0–0.3)
Total Bilirubin: 0.9 mg/dL (ref 0.2–1.2)
Total Protein: 6.1 g/dL (ref 6.0–8.3)

## 2014-01-02 ENCOUNTER — Other Ambulatory Visit (INDEPENDENT_AMBULATORY_CARE_PROVIDER_SITE_OTHER): Payer: Self-pay | Admitting: Internal Medicine

## 2014-01-08 DIAGNOSIS — G8929 Other chronic pain: Secondary | ICD-10-CM | POA: Diagnosis not present

## 2014-01-08 DIAGNOSIS — Z6831 Body mass index (BMI) 31.0-31.9, adult: Secondary | ICD-10-CM | POA: Diagnosis not present

## 2014-01-09 ENCOUNTER — Encounter (INDEPENDENT_AMBULATORY_CARE_PROVIDER_SITE_OTHER): Payer: Self-pay | Admitting: Internal Medicine

## 2014-01-09 ENCOUNTER — Ambulatory Visit (INDEPENDENT_AMBULATORY_CARE_PROVIDER_SITE_OTHER): Payer: Medicare Other | Admitting: Internal Medicine

## 2014-01-09 VITALS — BP 94/62 | HR 80 | Temp 97.9°F | Ht 73.5 in | Wt 246.8 lb

## 2014-01-09 DIAGNOSIS — R079 Chest pain, unspecified: Secondary | ICD-10-CM | POA: Diagnosis not present

## 2014-01-09 DIAGNOSIS — K208 Other esophagitis without bleeding: Secondary | ICD-10-CM

## 2014-01-09 DIAGNOSIS — K2210 Ulcer of esophagus without bleeding: Secondary | ICD-10-CM

## 2014-01-09 NOTE — Patient Instructions (Signed)
If you have chest pain: go to the ED.

## 2014-01-09 NOTE — Progress Notes (Signed)
Subjective:     Patient ID: Mike Floyd, male   DOB: November 26, 1960, 53 y.o.   MRN: [id number]  HPI Here today for f/u of his chest pain.  Last seen 12/27/2013. There  Has been no further chest pain. He does have some heaviness occasionally in his chest.  No further diaphoresis.  He continues to have exertional SOB. Presently taking Prilosec and Carafate. Hx of erosive esophagitis.  He states he can eat anything he wants.  Appetite has remained good. No weight loss. BMs x 2-3 a day.    12/27/2013 Patient ID: Mike Floyd, male DOB: Dec 04, 1960, 53 y.o. MRN: [id number]  07/02/2013 MYOCARDIAL IMAGING WITH SPECT (REST AND PHARMACOLOGIC-STRESS)  GATED LEFT VENTRICULAR WALL MOTION STUDY  LEFT VENTRICULAR EJECTION FRACTION  IMPRESSION:  Abnormal but low risk Lexiscan Cardiolite. There were no clearly  diagnostic ST segment abnormalities or arrhythmias. Perfusion  imaging shows possible scar affecting the anteroseptal apical wall  and inferior septal wall, both small defects, question of  reversibility at the base of the inferior septal wall. While not  excluding a region of scar with minor peri-infarct ischemia, this  could also be reflective of abnormal septal motion in the setting of  nonischemic cardiomyopathy, ICD in place. LVEF is 62% with normal  volumes, possible basal inferior hypokinesis.  Review of Systems Past Medical History  Diagnosis Date  . Diabetes mellitus DX: 2010  . Gout   . Hypertension   . CKD (chronic kidney disease), stage II   . Erosive esophagitis     a. per EGD (08/2011), Dr. Laural Golden - Erosive reflux esophagitis improved but not completely healed since previous EGD 3 years ago. Bx showing  ulcerated gatroesophageal junction mucosa. negative for H. pylori  . Nonischemic dilated cardiomyopathy     a. H/O EF as low as 35-40% by LV gram 10/2007;  b. Echo 02/2011 EF 50-55%, inf HK, Gr 1 DD   . DDD (degenerative disc disease), lumbar   . Hearing deficit     a. wear  bilateral hearing aides  . AICD (automatic cardioverter/defibrillator) present 11/2007    a. 11/2007 [facility] Current VR - single lead ICD  . Chest pain     a. 10/2007 Cath:  normal Cors.  . WPW (Wolff-Parkinson-White syndrome)     a. s/p RFCA @ [place]  . TIA (transient ischemic attack)     July, 2013  . Tobacco abuse, in remission 06/27/2009    Discontinued in 2009      Past Surgical History  Procedure Laterality Date  . Back surgery      1995  . Appendectomy    . Cholecystectomy    . Radiofrequency ablation  1999    WPW; performed at [facility]  . Elbow surgery  06/2010  . Cholecystectomy, laparoscopic      11/2007  . Esophagogastroduodenoscopy  03/31/2012    also 08/2011; [name]  . Colonoscopy w/ polypectomy  2009  . Cardiac defibrillator placement      No Known Allergies  Current Outpatient Prescriptions on File Prior to Visit  Medication Sig Dispense Refill  . allopurinol (ZYLOPRIM) 300 MG tablet Take 300 mg by mouth daily.       Marland Kitchen amitriptyline (ELAVIL) 50 MG tablet Take 1 tablet by mouth at bedtime.      . carvedilol (COREG) 25 MG tablet Take 1 tablet (25 mg total) by mouth 2 (two) times daily with a meal.  60 tablet  6  . clopidogrel (PLAVIX)  75 MG tablet Take 75 mg by mouth daily.      . digoxin (LANOXIN) 0.125 MG tablet Take 0.125 mg by mouth daily.      . furosemide (LASIX) 40 MG tablet Take 20 mg by mouth daily.      Marland Kitchen glimepiride (AMARYL) 4 MG tablet Take 4 mg by mouth daily before breakfast.      . isosorbide mononitrate (IMDUR) 30 MG 24 hr tablet Take 1 tablet (30 mg total) by mouth daily.  30 tablet  6  . L-Methylfolate-B6-B12 (FOLTANX) 3-35-2 MG TABS Take 1 tablet by mouth 2 (two) times daily.      Marland Kitchen lisinopril (PRINIVIL,ZESTRIL) 10 MG tablet TAKE ONE TABLET DAILY.  30 tablet  6  . LORazepam (ATIVAN) 1 MG tablet Take 1 mg by mouth at bedtime.      Marland Kitchen omeprazole (PRILOSEC) 40 MG capsule TAKE (1) CAPSULE BY MOUTH ONCE DAILY.  30 capsule  6  . oxycodone  (ROXICODONE) 30 MG immediate release tablet Take 1 tablet by mouth 4 (four) times daily as needed.      . pantoprazole (PROTONIX) 40 MG tablet Take 1 tablet (40 mg total) by mouth daily.  30 tablet  11  . potassium gluconate 595 MG TABS tablet Take 595 mg by mouth daily.      . sucralfate (CARAFATE) 1 G tablet TAKE 1 TABLET BY MOUTH 30 MINTUES BEFORE MEALS AND AT BEDTIME.  120 tablet  4   No current facility-administered medications on file prior to visit.        Objective:   Physical Exam  Filed Vitals:   01/09/14 1101  BP: 94/62  Pulse: 80  Temp: 97.9 F (36.6 C)  Height: 6' 1.5" (1.867 m)  Weight: 246 lb 12.8 oz (111.948 kg)  Alert and oriented. Skin warm and dry. Oral mucosa is moist.   . Sclera anicteric, conjunctivae is pink. Thyroid not enlarged. No cervical lymphadenopathy. Lungs clear. Heart regular rate and rhythm.  Abdomen is soft. Bowel sounds are positive. No hepatomegaly. No abdominal masses felt. No tenderness.  No edema to lower extremities.        Assessment:    GERD controlled at this time with Omeprazole and Carafate.  Chest pain. Probably cardiac in nature.    Plan:    Continue to Omeprazole and Carafate.  If chest pain returns, see your cardiologist or go directly to the ED.

## 2014-01-24 DIAGNOSIS — E1149 Type 2 diabetes mellitus with other diabetic neurological complication: Secondary | ICD-10-CM | POA: Diagnosis not present

## 2014-01-24 DIAGNOSIS — E119 Type 2 diabetes mellitus without complications: Secondary | ICD-10-CM | POA: Diagnosis not present

## 2014-01-30 ENCOUNTER — Other Ambulatory Visit: Payer: Self-pay | Admitting: Adult Health

## 2014-02-05 ENCOUNTER — Encounter: Payer: Self-pay | Admitting: Internal Medicine

## 2014-02-05 ENCOUNTER — Ambulatory Visit (INDEPENDENT_AMBULATORY_CARE_PROVIDER_SITE_OTHER): Payer: Medicare Other | Admitting: *Deleted

## 2014-02-05 DIAGNOSIS — I5022 Chronic systolic (congestive) heart failure: Secondary | ICD-10-CM

## 2014-02-05 DIAGNOSIS — I428 Other cardiomyopathies: Secondary | ICD-10-CM

## 2014-02-05 NOTE — Progress Notes (Signed)
Remote ICD transmission.   

## 2014-02-15 LAB — MDC_IDC_ENUM_SESS_TYPE_REMOTE
Battery Remaining Longevity: 49 mo
Brady Statistic RV Percent Paced: 1 %
HighPow Impedance: 74 Ohm
HighPow Impedance: 74 Ohm
Implantable Pulse Generator Serial Number: 536141
Lead Channel Impedance Value: 360 Ohm
Lead Channel Pacing Threshold Amplitude: 0.75 V
Lead Channel Pacing Threshold Pulse Width: 0.5 ms
Lead Channel Sensing Intrinsic Amplitude: 8.5 mV
Lead Channel Setting Pacing Amplitude: 2.5 V
Lead Channel Setting Pacing Pulse Width: 0.5 ms
MDC IDC MSMT BATTERY VOLTAGE: 2.59 V
MDC IDC SESS DTM: 20150630083956
MDC IDC SET LEADCHNL RV SENSING SENSITIVITY: 0.3 mV
MDC IDC SET ZONE DETECTION INTERVAL: 280 ms
MDC IDC SET ZONE DETECTION INTERVAL: 315 ms
Zone Setting Detection Interval: 250 ms

## 2014-02-18 ENCOUNTER — Other Ambulatory Visit (HOSPITAL_COMMUNITY): Payer: Self-pay | Admitting: Internal Medicine

## 2014-02-18 ENCOUNTER — Ambulatory Visit (HOSPITAL_COMMUNITY)
Admission: RE | Admit: 2014-02-18 | Discharge: 2014-02-18 | Disposition: A | Discharge status: Home / Self Care | Payer: Medicare Other | Source: Ambulatory Visit | Attending: Internal Medicine | Admitting: Internal Medicine

## 2014-02-18 DIAGNOSIS — R059 Cough, unspecified: Secondary | ICD-10-CM | POA: Diagnosis not present

## 2014-02-18 DIAGNOSIS — Z0000 Encounter for general adult medical examination without abnormal findings: Secondary | ICD-10-CM | POA: Diagnosis not present

## 2014-02-18 DIAGNOSIS — J209 Acute bronchitis, unspecified: Secondary | ICD-10-CM | POA: Diagnosis not present

## 2014-02-18 DIAGNOSIS — Z950 Presence of cardiac pacemaker: Secondary | ICD-10-CM | POA: Diagnosis not present

## 2014-02-18 DIAGNOSIS — R05 Cough: Secondary | ICD-10-CM

## 2014-02-18 DIAGNOSIS — Z6831 Body mass index (BMI) 31.0-31.9, adult: Secondary | ICD-10-CM | POA: Diagnosis not present

## 2014-02-18 DIAGNOSIS — E1149 Type 2 diabetes mellitus with other diabetic neurological complication: Secondary | ICD-10-CM | POA: Diagnosis not present

## 2014-02-18 DIAGNOSIS — R0602 Shortness of breath: Secondary | ICD-10-CM | POA: Insufficient documentation

## 2014-02-20 ENCOUNTER — Other Ambulatory Visit: Payer: Self-pay | Admitting: Physician Assistant

## 2014-02-25 ENCOUNTER — Encounter: Payer: Self-pay | Admitting: Cardiology

## 2014-02-28 ENCOUNTER — Other Ambulatory Visit: Payer: Self-pay | Admitting: Physician Assistant

## 2014-03-04 ENCOUNTER — Other Ambulatory Visit: Payer: Self-pay | Admitting: Physician Assistant

## 2014-03-06 ENCOUNTER — Other Ambulatory Visit: Payer: Self-pay | Admitting: Physician Assistant

## 2014-03-23 DIAGNOSIS — G8929 Other chronic pain: Secondary | ICD-10-CM | POA: Diagnosis not present

## 2014-03-23 DIAGNOSIS — Z6831 Body mass index (BMI) 31.0-31.9, adult: Secondary | ICD-10-CM | POA: Diagnosis not present

## 2014-03-23 DIAGNOSIS — I10 Essential (primary) hypertension: Secondary | ICD-10-CM | POA: Diagnosis not present

## 2014-03-26 ENCOUNTER — Encounter: Payer: Self-pay | Admitting: Cardiovascular Disease

## 2014-03-26 ENCOUNTER — Ambulatory Visit (INDEPENDENT_AMBULATORY_CARE_PROVIDER_SITE_OTHER): Payer: Medicare Other | Admitting: Cardiovascular Disease

## 2014-03-26 VITALS — BP 94/61 | HR 70 | Ht 74.0 in | Wt 239.0 lb

## 2014-03-26 DIAGNOSIS — K208 Other esophagitis without bleeding: Secondary | ICD-10-CM

## 2014-03-26 DIAGNOSIS — R5381 Other malaise: Secondary | ICD-10-CM

## 2014-03-26 DIAGNOSIS — K2210 Ulcer of esophagus without bleeding: Secondary | ICD-10-CM

## 2014-03-26 DIAGNOSIS — I959 Hypotension, unspecified: Secondary | ICD-10-CM

## 2014-03-26 DIAGNOSIS — I428 Other cardiomyopathies: Secondary | ICD-10-CM

## 2014-03-26 DIAGNOSIS — R079 Chest pain, unspecified: Secondary | ICD-10-CM

## 2014-03-26 DIAGNOSIS — G4733 Obstructive sleep apnea (adult) (pediatric): Secondary | ICD-10-CM

## 2014-03-26 DIAGNOSIS — Z95810 Presence of automatic (implantable) cardiac defibrillator: Secondary | ICD-10-CM

## 2014-03-26 DIAGNOSIS — R5383 Other fatigue: Secondary | ICD-10-CM | POA: Diagnosis not present

## 2014-03-26 DIAGNOSIS — K219 Gastro-esophageal reflux disease without esophagitis: Secondary | ICD-10-CM

## 2014-03-26 MED ORDER — LISINOPRIL 5 MG PO TABS: 5.0000 mg | ORAL_TABLET | Freq: Every day | ORAL | Status: DC

## 2014-03-26 NOTE — Progress Notes (Signed)
Patient ID: Mike Floyd, male   DOB: 09-27-1960, 53 y.o.   MRN: [id number]      SUBJECTIVE: The patient returns for routine follow up. In summary, he has a history of a nonischemic myopathy although left ventricular systolic function has since improved with normal EF in November 2014, 50-55%. He has an AICD and is followed by EP. He also has a history of WPW and is followed by Dr. Lovena Le (EP), and underwent radiofrequency ablation in 1999 at [facility]. He seldom has palpitations. Of note, he had normal coronary arteries by cardiac catheterization in 2003 and 2009. His most recent Alexander nuclear stress test showed a possible small area of scar with unclear reversibility, it was deemed an overall low risk study.  He also has a history of erosive esophagitis and has seen Dr. Laural Golden in the past, and recently followed up with his NP.   At his last visit, I started Imdur and recommended GI follow up, as he atypical chest pain. I also encouraged CPAP compliance as he complained of fatigue and daytime somnolence. He believes the Imdur has alleviated his chest pain. He followed up with gastroenterology and they felt his chest pain was cardiac in etiology, although he has normal coronary arteries by two prior coronary angiograms. He is unable to afford CPAP as it would cost him $2600. He underwent device interrogation on July 10 which demonstrated no ventricular arrhythmias and normal function. A chest x-ray on July 13 was normal as well with normal lead placement.    Review of Systems: As per "subjective", otherwise negative.  No Known Allergies  Current Outpatient Prescriptions  Medication Sig Dispense Refill  . allopurinol (ZYLOPRIM) 300 MG tablet Take 300 mg by mouth daily.       Marland Kitchen amitriptyline (ELAVIL) 50 MG tablet Take 1 tablet by mouth at bedtime.      . carvedilol (COREG) 25 MG tablet TAKE 2 TABLETS TWICE DAILY WITH MEALS  120 tablet  1  . clopidogrel (PLAVIX) 75 MG tablet Take 75 mg  by mouth daily.      . digoxin (LANOXIN) 0.125 MG tablet Take 0.125 mg by mouth daily.      . furosemide (LASIX) 40 MG tablet Take 20 mg by mouth daily.      Marland Kitchen glimepiride (AMARYL) 4 MG tablet Take 4 mg by mouth daily before breakfast.      . isosorbide mononitrate (IMDUR) 30 MG 24 hr tablet Take 1 tablet (30 mg total) by mouth daily.  30 tablet  6  . L-Methylfolate-B6-B12 (FOLTANX) 3-35-2 MG TABS Take 1 tablet by mouth 2 (two) times daily.      Marland Kitchen lisinopril (PRINIVIL,ZESTRIL) 20 MG tablet Take 1 tablet by mouth daily.      Marland Kitchen LORazepam (ATIVAN) 1 MG tablet Take 1 mg by mouth 3 (three) times daily.       Marland Kitchen omeprazole (PRILOSEC) 40 MG capsule TAKE (1) CAPSULE BY MOUTH ONCE DAILY.  30 capsule  6  . oxycodone (ROXICODONE) 30 MG immediate release tablet Take 1-2 tablets by mouth 4 (four) times daily as needed.       . potassium gluconate 595 MG TABS tablet Take 595 mg by mouth daily.      . sucralfate (CARAFATE) 1 G tablet TAKE 1 TABLET BY MOUTH 30 MINTUES BEFORE MEALS AND AT BEDTIME.  120 tablet  4   No current facility-administered medications for this visit.    Past Medical History  Diagnosis Date  . Diabetes  mellitus DX: 2010  . Gout   . Hypertension   . CKD (chronic kidney disease), stage II   . Erosive esophagitis     a. per EGD (08/2011), Dr. Laural Golden - Erosive reflux esophagitis improved but not completely healed since previous EGD 3 years ago. Bx showing  ulcerated gatroesophageal junction mucosa. negative for H. pylori  . Nonischemic dilated cardiomyopathy     a. H/O EF as low as 35-40% by LV gram 10/2007;  b. Echo 02/2011 EF 50-55%, inf HK, Gr 1 DD   . DDD (degenerative disc disease), lumbar   . Hearing deficit     a. wear bilateral hearing aides  . AICD (automatic cardioverter/defibrillator) present 11/2007    a. 11/2007 [facility] Current VR - single lead ICD  . Chest pain     a. 10/2007 Cath:  normal Cors.  . WPW (Wolff-Parkinson-White syndrome)     a. s/p RFCA @ [place]  . TIA  (transient ischemic attack)     July, 2013  . Tobacco abuse, in remission 06/27/2009    Discontinued in 2009      Past Surgical History  Procedure Laterality Date  . Back surgery      1995  . Appendectomy    . Cholecystectomy    . Radiofrequency ablation  1999    WPW; performed at [facility]  . Elbow surgery  06/2010  . Cholecystectomy, laparoscopic      11/2007  . Esophagogastroduodenoscopy  03/31/2012    also 08/2011; [name]  . Colonoscopy w/ polypectomy  2009  . Cardiac defibrillator placement      History   Social History  . Marital Status: Married    Spouse Name: N/A    Number of Children: N/A  . Years of Education: N/A   Occupational History  . Worked for [employer] off in 11/11   Social History Main Topics  . Smoking status: Former Smoker -- 1.50 packs/day for 35 years    Types: Cigarettes    Start date: 06/30/1971    Quit date: 07/30/2010  . Smokeless tobacco: Former Systems developer    Types: Snuff    Quit date: 03/26/1994     Comment: 03/26/14 currently uses a vape  . Alcohol Use: No     Comment: used "years ago"  . Drug Use: No  . Sexual Activity: Not on file   Other Topics Concern  . Not on file   Social History Narrative  . No narrative on file     Filed Vitals:   03/26/14 1345  BP: 94/61  Pulse: 70  Height: 6\' 2"  (1.88 m)  Weight: 239 lb (108.41 kg)  SpO2: 99%    PHYSICAL EXAM General: NAD HEENT: Normal. Neck: No JVD, no thyromegaly. Lungs: Clear to auscultation bilaterally with normal respiratory effort. CV: Nondisplaced PMI.  Regular rate and rhythm, normal S1/S2, no S3/S4, no murmur. No pretibial or periankle edema.  No carotid bruit.  Normal pedal pulses.  Abdomen: Soft, nontender, no hepatosplenomegaly, no distention.  Neurologic: Alert and oriented x 3.  Psych: Normal affect. Skin: Normal. Musculoskeletal: Normal range of motion, no gross deformities. Extremities: No clubbing or cyanosis.   ECG: Most recent ECG  reviewed.      ASSESSMENT AND PLAN: 1. NICM: Normalized LVEF. Has stable dyspnea with exertion. He appears euvolemic today. Continue current dose of Coreg. BP is low, thus will reduce lisinopril to 5 mg daily in setting of normal LV systolic function.  2. Chest pain: Atypical symptoms for cardiac chest pain, improved with Imdur. Overall low risk MPI 06/2013, question of possible small scar, unclear if any reversibility. Continue Imdur 30 mg daily. I previously asked the patient to follow up with his GI doctor as symptoms were concerning for recurrence of his erosive esophagitis, but they felt it was cardiac. Given the previous low risk nuclear MPI and normal coronary angiograms, no further testing is warranted.  3. Fatigue: Very likely related to OSA and not using CPAP. However, he is unable to afford it. May also be caused by hypotension, thus will reduce lisinopril to 5 mg. 4. WPW: Seldom has palpitations, with history of prior ablation. Continue regular follow up with EP.  5. Hypotension: Now that LV systolic function has normalized, medical therapy likely excessive. Will reduce lisinopril to 10 mg daily. 6. ICD: Normal device function on 7/10 with no ventricular arrhythmias.  Follow up in 1 year.  Kate Sable, M.D., F.A.C.C.

## 2014-03-26 NOTE — Patient Instructions (Signed)
   Decrease Lisinopril to 5mg  daily - new sent to pharm Continue all other medications.   Your physician wants you to follow up in:  1 year.  You will receive a reminder letter in the mail one-two months in advance.  If you don't receive a letter, please call our office to schedule the follow up appointment

## 2014-04-12 DIAGNOSIS — F411 Generalized anxiety disorder: Secondary | ICD-10-CM | POA: Diagnosis not present

## 2014-04-12 DIAGNOSIS — G8929 Other chronic pain: Secondary | ICD-10-CM | POA: Diagnosis not present

## 2014-04-12 DIAGNOSIS — Z6831 Body mass index (BMI) 31.0-31.9, adult: Secondary | ICD-10-CM | POA: Diagnosis not present

## 2014-05-08 ENCOUNTER — Other Ambulatory Visit: Payer: Self-pay | Admitting: Internal Medicine

## 2014-05-09 NOTE — Telephone Encounter (Signed)
03/26/14 ov by dr.Koneswaran has pt taking Lasix 20 mg daily.I called Tripp at [facility] and he is going to change in his system.he had an old script from Dr.Taylor   I LM at pt's home to clarify this with them

## 2014-05-17 ENCOUNTER — Encounter: Payer: Self-pay | Admitting: *Deleted

## 2014-06-10 ENCOUNTER — Encounter: Payer: Self-pay | Admitting: *Deleted

## 2014-07-15 ENCOUNTER — Ambulatory Visit (INDEPENDENT_AMBULATORY_CARE_PROVIDER_SITE_OTHER): Payer: Medicare Other | Admitting: Internal Medicine

## 2014-07-16 ENCOUNTER — Telehealth: Payer: Self-pay | Admitting: *Deleted

## 2014-07-16 ENCOUNTER — Ambulatory Visit (INDEPENDENT_AMBULATORY_CARE_PROVIDER_SITE_OTHER): Payer: Medicare Other | Admitting: Internal Medicine

## 2014-07-16 ENCOUNTER — Encounter (INDEPENDENT_AMBULATORY_CARE_PROVIDER_SITE_OTHER): Payer: Self-pay | Admitting: Internal Medicine

## 2014-07-16 VITALS — BP 98/50 | HR 76 | Temp 97.9°F | Ht 73.5 in | Wt 235.8 lb

## 2014-07-16 DIAGNOSIS — K209 Esophagitis, unspecified without bleeding: Secondary | ICD-10-CM

## 2014-07-16 DIAGNOSIS — E1129 Type 2 diabetes mellitus with other diabetic kidney complication: Secondary | ICD-10-CM | POA: Diagnosis not present

## 2014-07-16 DIAGNOSIS — G894 Chronic pain syndrome: Secondary | ICD-10-CM | POA: Diagnosis not present

## 2014-07-16 DIAGNOSIS — N183 Chronic kidney disease, stage 3 (moderate): Secondary | ICD-10-CM | POA: Diagnosis not present

## 2014-07-16 DIAGNOSIS — Z23 Encounter for immunization: Secondary | ICD-10-CM | POA: Diagnosis not present

## 2014-07-16 DIAGNOSIS — Z6831 Body mass index (BMI) 31.0-31.9, adult: Secondary | ICD-10-CM | POA: Diagnosis not present

## 2014-07-16 NOTE — Telephone Encounter (Signed)
Message left on voice mail - wife ([name]) c/o husband having chest pain x 3 weeks off/on.  Attempted to return call - left message.

## 2014-07-16 NOTE — Patient Instructions (Signed)
OV in one year. If you have chest pain, please go to the ED.

## 2014-07-16 NOTE — Progress Notes (Signed)
Subjective:    Patient ID: Mike Floyd, male    DOB: July 26, 1961, 53 y.o.   MRN: [id number]  HPI Here today for f/u. Last seen in June of this year. He tells me he is doing fair. He does have some pressure in his chest. Saw Cardiologist in August and was told he is doing okay.  There is no chest pain though it does feel like something is heavy sometimes. Presently taking Prilosec 40mg  daily.  If he moves around a lot he does become SOB, and he has told his cardiologist this.  Appetite is good. No weight loss.  He does have some dysphagia occasionally.   BMs x 2-3 a day.  Hx of MI in 1999.  EGD 2013:  Hx of erosive esophagitis and had stopped PPI. Impression: Erosive/ulcerative reflux esophagitis which has gotten worse since last EGD of January 2013     12/27/2013 Patient ID: Mike Floyd, male DOB: May 28, 1961, 53 y.o. MRN: [id number] 07/02/2013 MYOCARDIAL IMAGING WITH SPECT (REST AND PHARMACOLOGIC-STRESS)  GATED LEFT VENTRICULAR WALL MOTION STUDY  LEFT VENTRICULAR EJECTION FRACTION  IMPRESSION:  Abnormal but low risk Lexiscan Cardiolite. There were no clearly  diagnostic ST segment abnormalities or arrhythmias. Perfusion  imaging shows possible scar affecting the anteroseptal apical wall  and inferior septal wall, both small defects, question of  reversibility at the base of the inferior septal wall. While not  excluding a region of scar with minor peri-infarct ischemia, this  could also be reflective of abnormal septal motion in the setting of  nonischemic cardiomyopathy, ICD in place. LVEF is 62% with normal  volumes, possible basal inferior hypokinesis.   Review of Systems Past Medical History  Diagnosis Date  . Diabetes mellitus DX: 2010  . Gout   . Hypertension   . CKD (chronic kidney disease), stage II   . Erosive esophagitis     a. per EGD (08/2011), Dr. Laural Golden - Erosive reflux esophagitis improved but not completely healed since previous EGD 3 years  ago. Bx showing  ulcerated gatroesophageal junction mucosa. negative for H. pylori  . Nonischemic dilated cardiomyopathy     a. H/O EF as low as 35-40% by LV gram 10/2007;  b. Echo 02/2011 EF 50-55%, inf HK, Gr 1 DD   . DDD (degenerative disc disease), lumbar   . Hearing deficit     a. wear bilateral hearing aides  . AICD (automatic cardioverter/defibrillator) present 11/2007    a. 11/2007 [facility] Current VR - single lead ICD  . Chest pain     a. 10/2007 Cath:  normal Cors.  . WPW (Wolff-Parkinson-White syndrome)     a. s/p RFCA @ [place]  . TIA (transient ischemic attack)     July, 2013  . Tobacco abuse, in remission 06/27/2009    Discontinued in 2009      Past Surgical History  Procedure Laterality Date  . Back surgery      1995  . Appendectomy    . Cholecystectomy    . Radiofrequency ablation  1999    WPW; performed at [facility]  . Elbow surgery  06/2010  . Cholecystectomy, laparoscopic      11/2007  . Esophagogastroduodenoscopy  03/31/2012    also 08/2011; [name]  . Colonoscopy w/ polypectomy  2009  . Cardiac defibrillator placement      No Known Allergies  Current Outpatient Prescriptions on File Prior to Visit  Medication Sig Dispense Refill  . allopurinol (ZYLOPRIM) 300 MG tablet Take  300 mg by mouth daily.     Marland Kitchen amitriptyline (ELAVIL) 50 MG tablet Take 1 tablet by mouth at bedtime.    . carvedilol (COREG) 25 MG tablet TAKE 2 TABLETS TWICE DAILY WITH MEALS 120 tablet 1  . clopidogrel (PLAVIX) 75 MG tablet Take 75 mg by mouth daily.    . digoxin (LANOXIN) 0.125 MG tablet Take 0.125 mg by mouth daily.    . furosemide (LASIX) 40 MG tablet Take 20 mg by mouth daily.    Marland Kitchen glimepiride (AMARYL) 4 MG tablet Take 4 mg by mouth daily before breakfast.    . isosorbide mononitrate (IMDUR) 30 MG 24 hr tablet Take 1 tablet (30 mg total) by mouth daily. 30 tablet 6  . lisinopril (PRINIVIL,ZESTRIL) 5 MG tablet Take 1 tablet (5 mg total) by mouth daily. 30 tablet 6  . LORazepam  (ATIVAN) 1 MG tablet Take 1 mg by mouth 3 (three) times daily.     Marland Kitchen omeprazole (PRILOSEC) 40 MG capsule TAKE (1) CAPSULE BY MOUTH ONCE DAILY. 30 capsule 6  . oxycodone (ROXICODONE) 30 MG immediate release tablet Take 1-2 tablets by mouth 4 (four) times daily as needed.     Marland Kitchen L-Methylfolate-B6-B12 (FOLTANX) 3-35-2 MG TABS Take 1 tablet by mouth 2 (two) times daily.    . sucralfate (CARAFATE) 1 G tablet TAKE 1 TABLET BY MOUTH 30 MINTUES BEFORE MEALS AND AT BEDTIME. 120 tablet 4   No current facility-administered medications on file prior to visit.        Objective:   Physical Exam  Filed Vitals:   07/16/14 1607  Height: 6' 1.5" (1.867 m)  Weight: 235 lb 12.8 oz (106.958 kg)  Alert and oriented. Skin warm and dry. Oral mucosa is moist.   . Sclera anicteric, conjunctivae is pink. Thyroid not enlarged. No cervical lymphadenopathy. Lungs clear. Heart regular rate and rhythm.  Abdomen is soft. Bowel sounds are positive. No hepatomegaly. No abdominal masses felt. No tenderness.  No edema to lower extremities. Patient is alert and oriented.         Assessment & Plan:  Hx of erosive esophagitis. He says he is doing better. Advised to chew foods well.  If dysphagia worsens call our office .

## 2014-07-17 ENCOUNTER — Encounter: Payer: Self-pay | Admitting: *Deleted

## 2014-07-18 ENCOUNTER — Ambulatory Visit (INDEPENDENT_AMBULATORY_CARE_PROVIDER_SITE_OTHER): Payer: Medicare Other | Admitting: *Deleted

## 2014-07-18 ENCOUNTER — Encounter: Payer: Self-pay | Admitting: Internal Medicine

## 2014-07-18 ENCOUNTER — Telehealth: Payer: Self-pay | Admitting: Cardiology

## 2014-07-18 DIAGNOSIS — I5022 Chronic systolic (congestive) heart failure: Secondary | ICD-10-CM

## 2014-07-18 DIAGNOSIS — I429 Cardiomyopathy, unspecified: Secondary | ICD-10-CM

## 2014-07-18 DIAGNOSIS — I428 Other cardiomyopathies: Secondary | ICD-10-CM

## 2014-07-18 LAB — MDC_IDC_ENUM_SESS_TYPE_REMOTE
Battery Voltage: 2.59 V
Brady Statistic RV Percent Paced: 1 %
HighPow Impedance: 80 Ohm
HighPow Impedance: 80 Ohm
Lead Channel Sensing Intrinsic Amplitude: 10.1 mV
Lead Channel Setting Pacing Amplitude: 2.5 V
Lead Channel Setting Pacing Pulse Width: 0.5 ms
MDC IDC MSMT BATTERY REMAINING LONGEVITY: 50 mo
MDC IDC MSMT LEADCHNL RV IMPEDANCE VALUE: 460 Ohm
MDC IDC PG SERIAL: 536141
MDC IDC SESS DTM: 20151210195655
MDC IDC SET LEADCHNL RV SENSING SENSITIVITY: 0.3 mV
MDC IDC SET ZONE DETECTION INTERVAL: 315 ms
Zone Setting Detection Interval: 250 ms
Zone Setting Detection Interval: 280 ms

## 2014-07-18 NOTE — Telephone Encounter (Signed)
Doubt pain is cardiac, with two normal caths. Has a h/o erosive esophagitis. Increase Imdur to 60 mg daily to see if this helps.

## 2014-07-18 NOTE — Telephone Encounter (Signed)
Discussed below with patient.  States the chest pain is still about the same as always (since last visit in August), but seems to be occuring more often.  Also, does feel like he has more fatigue lately.  Patient has scheduled appointment for 08/07/14.

## 2014-07-18 NOTE — Telephone Encounter (Signed)
Attempted to help pt trouble shoot but after several attempts there was no success. Gave pt tech service number. Pt verbalized understanding.

## 2014-07-18 NOTE — Telephone Encounter (Signed)
-----   Message from Laurine Blazer, LPN sent at [phone]  2:27 PM EST ----- Please call patient with questions about his device.  Does not think you are getting transmissions.    Thanks, Edd Fabian

## 2014-07-18 NOTE — Progress Notes (Signed)
Remote ICD transmission.   

## 2014-07-19 NOTE — Telephone Encounter (Signed)
Patient notified via voice mail to make increase on Imdur.  Asked for a return call on Monday to confirm message received.

## 2014-07-23 NOTE — Telephone Encounter (Signed)
No return call received.  Attempted to return call - left message.

## 2014-07-25 ENCOUNTER — Encounter: Payer: Self-pay | Admitting: Cardiology

## 2014-07-30 NOTE — Telephone Encounter (Signed)
Patient had appointment scheduled for 08/07/14 with Dr. Harl Bowie.   See cancellation note - Patient (Patient is feeling better per Su Hoff, wife/nta )

## 2014-08-06 ENCOUNTER — Encounter: Payer: Self-pay | Admitting: *Deleted

## 2014-08-07 ENCOUNTER — Ambulatory Visit: Payer: Medicare Other | Admitting: Cardiovascular Disease

## 2014-08-08 ENCOUNTER — Other Ambulatory Visit: Payer: Self-pay | Admitting: Cardiovascular Disease

## 2014-08-15 ENCOUNTER — Other Ambulatory Visit (HOSPITAL_COMMUNITY): Payer: Self-pay | Admitting: Internal Medicine

## 2014-08-15 DIAGNOSIS — Z6831 Body mass index (BMI) 31.0-31.9, adult: Secondary | ICD-10-CM | POA: Diagnosis not present

## 2014-08-15 DIAGNOSIS — E119 Type 2 diabetes mellitus without complications: Secondary | ICD-10-CM | POA: Diagnosis not present

## 2014-08-15 DIAGNOSIS — M79604 Pain in right leg: Secondary | ICD-10-CM

## 2014-08-15 DIAGNOSIS — M79605 Pain in left leg: Principal | ICD-10-CM

## 2014-08-15 DIAGNOSIS — R2689 Other abnormalities of gait and mobility: Secondary | ICD-10-CM | POA: Diagnosis not present

## 2014-08-15 DIAGNOSIS — G5600 Carpal tunnel syndrome, unspecified upper limb: Secondary | ICD-10-CM | POA: Diagnosis not present

## 2014-08-15 DIAGNOSIS — I739 Peripheral vascular disease, unspecified: Secondary | ICD-10-CM

## 2014-08-19 ENCOUNTER — Ambulatory Visit (HOSPITAL_COMMUNITY)
Admission: RE | Admit: 2014-08-19 | Discharge: 2014-08-19 | Disposition: A | Discharge status: Home / Self Care | Payer: Medicare Other | Source: Ambulatory Visit | Attending: Internal Medicine | Admitting: Internal Medicine

## 2014-08-19 DIAGNOSIS — I739 Peripheral vascular disease, unspecified: Secondary | ICD-10-CM | POA: Diagnosis not present

## 2014-08-19 DIAGNOSIS — M79605 Pain in left leg: Secondary | ICD-10-CM | POA: Insufficient documentation

## 2014-08-19 DIAGNOSIS — Z720 Tobacco use: Secondary | ICD-10-CM | POA: Diagnosis not present

## 2014-08-19 DIAGNOSIS — E119 Type 2 diabetes mellitus without complications: Secondary | ICD-10-CM | POA: Insufficient documentation

## 2014-08-19 DIAGNOSIS — I10 Essential (primary) hypertension: Secondary | ICD-10-CM | POA: Insufficient documentation

## 2014-08-19 DIAGNOSIS — M79604 Pain in right leg: Secondary | ICD-10-CM | POA: Diagnosis not present

## 2014-08-19 DIAGNOSIS — R200 Anesthesia of skin: Secondary | ICD-10-CM | POA: Diagnosis not present

## 2014-08-20 DIAGNOSIS — B351 Tinea unguium: Secondary | ICD-10-CM | POA: Diagnosis not present

## 2014-08-20 DIAGNOSIS — L851 Acquired keratosis [keratoderma] palmaris et plantaris: Secondary | ICD-10-CM | POA: Diagnosis not present

## 2014-08-20 DIAGNOSIS — E1342 Other specified diabetes mellitus with diabetic polyneuropathy: Secondary | ICD-10-CM | POA: Diagnosis not present

## 2014-09-02 DIAGNOSIS — E1140 Type 2 diabetes mellitus with diabetic neuropathy, unspecified: Secondary | ICD-10-CM | POA: Diagnosis not present

## 2014-09-02 DIAGNOSIS — M159 Polyosteoarthritis, unspecified: Secondary | ICD-10-CM | POA: Diagnosis not present

## 2014-09-02 DIAGNOSIS — G5600 Carpal tunnel syndrome, unspecified upper limb: Secondary | ICD-10-CM | POA: Diagnosis not present

## 2014-09-11 DIAGNOSIS — G5600 Carpal tunnel syndrome, unspecified upper limb: Secondary | ICD-10-CM | POA: Diagnosis not present

## 2014-09-11 DIAGNOSIS — G609 Hereditary and idiopathic neuropathy, unspecified: Secondary | ICD-10-CM | POA: Diagnosis not present

## 2014-09-11 DIAGNOSIS — M5412 Radiculopathy, cervical region: Secondary | ICD-10-CM | POA: Diagnosis not present

## 2014-09-12 ENCOUNTER — Other Ambulatory Visit: Payer: Self-pay | Admitting: Cardiology

## 2014-09-12 MED ORDER — CARVEDILOL 25 MG PO TABS: 50.0000 mg | ORAL_TABLET | Freq: Two times a day (BID) | ORAL | Status: DC

## 2014-09-12 NOTE — Telephone Encounter (Signed)
Refilled medication below.

## 2014-09-12 NOTE — Telephone Encounter (Signed)
Received fax refill request  Rx # [id number] Medication:  Carvedilol 25 mg tablet Qty 60 Sig:  Take one tablet by mouth twice daily with meals Physician:  Harl Bowie

## 2014-10-22 ENCOUNTER — Other Ambulatory Visit (INDEPENDENT_AMBULATORY_CARE_PROVIDER_SITE_OTHER): Payer: Self-pay | Admitting: *Deleted

## 2014-10-22 ENCOUNTER — Telehealth: Payer: Self-pay | Admitting: *Deleted

## 2014-10-22 ENCOUNTER — Ambulatory Visit (INDEPENDENT_AMBULATORY_CARE_PROVIDER_SITE_OTHER): Payer: Medicare Other | Admitting: Internal Medicine

## 2014-10-22 ENCOUNTER — Encounter (INDEPENDENT_AMBULATORY_CARE_PROVIDER_SITE_OTHER): Payer: Self-pay | Admitting: *Deleted

## 2014-10-22 ENCOUNTER — Encounter (INDEPENDENT_AMBULATORY_CARE_PROVIDER_SITE_OTHER): Payer: Self-pay | Admitting: Internal Medicine

## 2014-10-22 VITALS — BP 100/70 | HR 64 | Temp 97.9°F | Resp 18 | Ht 73.5 in | Wt 232.6 lb

## 2014-10-22 DIAGNOSIS — R112 Nausea with vomiting, unspecified: Secondary | ICD-10-CM | POA: Diagnosis not present

## 2014-10-22 DIAGNOSIS — R1319 Other dysphagia: Secondary | ICD-10-CM

## 2014-10-22 DIAGNOSIS — R1314 Dysphagia, pharyngoesophageal phase: Secondary | ICD-10-CM | POA: Diagnosis not present

## 2014-10-22 DIAGNOSIS — K208 Other esophagitis: Secondary | ICD-10-CM

## 2014-10-22 DIAGNOSIS — R1310 Dysphagia, unspecified: Secondary | ICD-10-CM

## 2014-10-22 DIAGNOSIS — K2210 Ulcer of esophagus without bleeding: Secondary | ICD-10-CM

## 2014-10-22 NOTE — Telephone Encounter (Signed)
Will forward to [place]

## 2014-10-22 NOTE — Patient Instructions (Signed)
Esophagogastroduodenoscopy and esophageal dilation to be scheduled.

## 2014-10-22 NOTE — Telephone Encounter (Signed)
Mike Floyd from Dr. Olevia Perches office called, pt is having EGD on April 1st, Ok to hold plavix 5 days prior. Will forward to Dr. Bronson Ing

## 2014-10-22 NOTE — Progress Notes (Signed)
Presenting complaint;  Nausea vomiting and dysphagia.  Subjective:  Patient is 54 year old Caucasian male with multiple medical problems including nonischemic cardiomyopathy and he has AICD as well as diabetes mellitus who presents for scheduled visit. He was last seen in December 2015 and appeared to be doing well as far as GERD is concerned and he was having sporadic dysphagia. He now returns stating his swallowing difficulty has gotten worse. He has difficulty swallowing solids virtually every day. He is able to get relief by regurgitating food bolus or vibrating. He is also complaining of postprandial nausea and vomiting. Vomiting generally occurs within 30 minutes of meals and he vomits food and fluid. Nausea and vomiting started about a month ago. He denies melena or rectal bleeding. He states he is watching his diet and tries to stay there from fatty and 5 foods and does not drink or less. He has heartburn nor more than once or twice a week despite taking omeprazole. He states Dr. Gerarda Fraction has been very pleased with glycemic control. Last hemoglobin A1c was less than 6.  Prior GI workup; Patient underwent EGD in January 2013 and noted to have erosive reflux esophagitis. Follow-up EGD in August 2013 revealed worsening changes of esophagitis. He had solid-phase gastric emptying study also in August 2013 which revealed 30% of the activity and stomach it 2 hours which is considered to be normal.   Current Medications: Outpatient Encounter Prescriptions as of 10/22/2014  Medication Sig  . allopurinol (ZYLOPRIM) 300 MG tablet Take 300 mg by mouth daily.   Marland Kitchen amitriptyline (ELAVIL) 50 MG tablet Take 1 tablet by mouth at bedtime.  Marland Kitchen aspirin 81 MG tablet Take 81 mg by mouth daily.  Marland Kitchen atorvastatin (LIPITOR) 20 MG tablet Take 20 mg by mouth daily.  . carvedilol (COREG) 25 MG tablet Take 2 tablets (50 mg total) by mouth 2 (two) times daily.  . clopidogrel (PLAVIX) 75 MG tablet Take 75 mg by mouth  daily.  . colchicine 0.6 MG tablet Take 0.6 mg by mouth 2 (two) times daily.  . digoxin (LANOXIN) 0.125 MG tablet Take 0.125 mg by mouth daily.  . furosemide (LASIX) 40 MG tablet Take 20 mg by mouth daily.  Marland Kitchen glimepiride (AMARYL) 4 MG tablet Take 4 mg by mouth daily before breakfast.  . isosorbide mononitrate (IMDUR) 30 MG 24 hr tablet TAKE ONE TABLET BY MOUTH ONCE DAILY.  Marland Kitchen L-Methylfolate-B6-B12 (FOLTANX) 3-35-2 MG TABS Take 1 tablet by mouth 2 (two) times daily.  Marland Kitchen lisinopril (PRINIVIL,ZESTRIL) 5 MG tablet Take 1 tablet (5 mg total) by mouth daily.  Marland Kitchen LORazepam (ATIVAN) 1 MG tablet Take 1 mg by mouth 3 (three) times daily.   Marland Kitchen omeprazole (PRILOSEC) 40 MG capsule TAKE (1) CAPSULE BY MOUTH ONCE DAILY.  Marland Kitchen oxycodone (ROXICODONE) 30 MG immediate release tablet Take 1-2 tablets by mouth 4 (four) times daily as needed.   . sucralfate (CARAFATE) 1 G tablet TAKE 1 TABLET BY MOUTH 30 MINTUES BEFORE MEALS AND AT BEDTIME.   Past Medical History  Diagnosis Date  . Diabetes mellitus DX: 2010  . Gout   . Hypertension   . CKD (chronic kidney disease), stage II   . Erosive esophagitis     a. per EGD (08/2011), Dr. Laural Golden - Erosive reflux esophagitis improved but not completely healed since previous EGD 3 years ago. Bx showing  ulcerated gatroesophageal junction mucosa. negative for H. pylori  . Nonischemic dilated cardiomyopathy     a. H/O EF as low as 35-40% by  LV gram 10/2007;  b. Echo 02/2011 EF 50-55%, inf HK, Gr 1 DD   . DDD (degenerative disc disease), lumbar   . Hearing deficit     a. wear bilateral hearing aides  . AICD (automatic cardioverter/defibrillator) present 11/2007    a. 11/2007 [facility] Current VR - single lead ICD  . Chest pain     a. 10/2007 Cath:  normal Cors.  . WPW (Wolff-Parkinson-White syndrome)     a. s/p RFCA @ [facility]  . TIA (transient ischemic attack)     July, 2013  . Tobacco abuse, in remission 06/27/2009    Discontinued in 2009     Past Surgical History  Procedure  Laterality Date  . Back surgery      1995  . Appendectomy    . Cholecystectomy    . Radiofrequency ablation  1999    WPW; performed at [facility]  . Elbow surgery  06/2010  . Cholecystectomy, laparoscopic      11/2007  . Esophagogastroduodenoscopy  03/31/2012    also 08/2011; [facility]  . Colonoscopy w/ polypectomy  2009  . Cardiac defibrillator placement        Objective: Blood pressure 100/70, pulse 64, temperature 97.9 F (36.6 C), temperature source Oral, resp. rate 18, height 6' 1.5" (1.867 m), weight 232 lb 9.6 oz (105.507 kg). Patient is alert and in no acute distress. Conjunctiva is pink. Sclera is nonicteric Oropharyngeal mucosa is normal. No neck masses or thyromegaly noted. Cardiac exam with regular rhythm normal S1 and S2. No murmur or gallop noted. Lungs are clear to auscultation. Abdomen is symmetrical soft and nontender without organomegaly or masses.  No LE edema or clubbing noted.   Assessment:  #1. Solid food dysphagia. He has history of ulcerative reflux esophagitis and may have developed stricture. If indeed this is the case stricture will be dilated. #2. Nausea and vomiting. He has only lost 3 pounds in the last 3 months. Need to rule out peptic ulcer disease or gastroparesis. Unless he has peptic ulcer disease or pyloric stenosis gastric motility may have to be very assessed. .  Plan:  Esophagogastroduodenoscopy with esophageal dilation with monitored anesthesia care. Patient will need to be off clopidogrel for 5 days. Will check with patient's cardiologist before procedure scheduled.

## 2014-10-22 NOTE — Telephone Encounter (Signed)
Spoke to patient he states he doesn't have a neurologist, please advise

## 2014-10-22 NOTE — Telephone Encounter (Signed)
He is probably on this for h/o TIA, not cardiac stents. Would have GI check with neurology.

## 2014-10-22 NOTE — Telephone Encounter (Signed)
Will forward to Dr. Koneswaran  

## 2014-10-23 NOTE — Telephone Encounter (Signed)
Will forward to [employer]

## 2014-10-23 NOTE — Telephone Encounter (Signed)
Ok to hold 5 days prior.

## 2014-10-23 NOTE — Telephone Encounter (Signed)
Patient aware.

## 2014-10-29 DIAGNOSIS — B351 Tinea unguium: Secondary | ICD-10-CM | POA: Diagnosis not present

## 2014-10-29 DIAGNOSIS — E1342 Other specified diabetes mellitus with diabetic polyneuropathy: Secondary | ICD-10-CM | POA: Diagnosis not present

## 2014-10-29 DIAGNOSIS — L851 Acquired keratosis [keratoderma] palmaris et plantaris: Secondary | ICD-10-CM | POA: Diagnosis not present

## 2014-11-05 NOTE — Patient Instructions (Signed)
Mike Floyd  11/05/2014   Your procedure is scheduled on:   11/08/2014  Report to [facility] at  63  AM.  Call this number if you have problems the morning of surgery: [phone]   Remember:   Do not eat food or drink liquids after midnight.   Take these medicines the morning of surgery with A SIP OF WATER:  Allopurinol, coreg, colchicine, digoxin, imdur, lisiniopril, ativan, prilosec, oxycodone.   Do not wear jewelry, make-up or nail polish.  Do not wear lotions, powders, or perfumes.   Do not shave 48 hours prior to surgery. Men may shave face and neck.  Do not bring valuables to the hospital.  [facility] is not responsible for any belongings or valuables.               Contacts, dentures or bridgework may not be worn into surgery.  Leave suitcase in the car. After surgery it may be brought to your room.  For patients admitted to the hospital, discharge time is determined by your treatment team.               Patients discharged the day of surgery will not be allowed to drive home.  Name and phone number of your driver: family  Special Instructions: N/A   Please read over the following fact sheets that you were given: Pain Booklet, Coughing and Deep Breathing, Surgical Site Infection Prevention, Anesthesia Post-op Instructions and Care and Recovery After Surgery Esophagogastroduodenoscopy Esophagogastroduodenoscopy (EGD) is a procedure to examine the lining of the esophagus, stomach, and first part of the small intestine (duodenum). A long, flexible, lighted tube with a camera attached (endoscope) is inserted down the throat to view these organs. This procedure is done to detect problems or abnormalities, such as inflammation, bleeding, ulcers, or growths, in order to treat them. The procedure lasts about 5-20 minutes. It is usually an outpatient procedure, but it may need to be performed in emergency cases in the hospital. LET YOUR CAREGIVER KNOW ABOUT:   Allergies to  food or medicine.  All medicines you are taking, including vitamins, herbs, eyedrops, and over-the-counter medicines and creams.  Use of steroids (by mouth or creams).  Previous problems you or members of your family have had with the use of anesthetics.  Any blood disorders you have.  Previous surgeries you have had.  Other health problems you have.  Possibility of pregnancy, if this applies. RISKS AND COMPLICATIONS  Generally, EGD is a safe procedure. However, as with any procedure, complications can occur. Possible complications include:  Infection.  Bleeding.  Tearing (perforation) of the esophagus, stomach, or duodenum.  Difficulty breathing or not being able to breath.  Excessive sweating.  Spasms of the larynx.  Slowed heartbeat.  Low blood pressure. BEFORE THE PROCEDURE  Do not eat or drink anything for 6-8 hours before the procedure or as directed by your caregiver.  Ask your caregiver about changing or stopping your regular medicines.  If you wear dentures, be prepared to remove them before the procedure.  Arrange for someone to drive you home after the procedure. PROCEDURE   A vein will be accessed to give medicines and fluids. A medicine to relax you (sedative) and a pain reliever will be given through that access into the vein.  A numbing medicine (local anesthetic) may be sprayed on your throat for comfort and to stop you from gagging or coughing.  A mouth guard may be  placed in your mouth to protect your teeth and to keep you from biting on the endoscope.  You will be asked to lie on your left side.  The endoscope is inserted down your throat and into the esophagus, stomach, and duodenum.  Air is put through the endoscope to allow your caregiver to view the lining of your esophagus clearly.  The esophagus, stomach, and duodenum is then examined. During the exam, your caregiver may:  Remove tissue to be examined under a microscope (biopsy) for  inflammation, infection, or other medical problems.  Remove growths.  Remove objects (foreign bodies) that are stuck.  Treat any bleeding with medicines or other devices that stop tissues from bleeding (hot cautery, clipping devices).  Widen (dilate) or stretch narrowed areas of the esophagus and stomach.  The endoscope will then be withdrawn. AFTER THE PROCEDURE  You will be taken to a recovery area to be monitored. You will be able to go home once you are stable and alert.  Do not eat or drink anything until the local anesthetic and numbing medicines have worn off. You may choke.  It is normal to feel bloated, have pain with swallowing, or have a sore throat for a short time. This will wear off.  Your caregiver should be able to discuss his or her findings with you. It will take longer to discuss the test results if any biopsies were taken. Document Released: 11/26/2004 Document Revised: 12/10/2013 Document Reviewed: 06/28/2012 [facility] Patient Information 2015 [place], [place]. This information is not intended to replace advice given to you by your health care provider. Make sure you discuss any questions you have with your health care provider. Esophageal Dilatation The esophagus is the long, narrow tube which carries food and liquid from the mouth to the stomach. Esophageal dilatation is the technique used to stretch a blocked or narrowed portion of the esophagus. This procedure is used when a part of the esophagus has become so narrow that it becomes difficult, painful or even impossible to swallow. This is generally an uncomplicated form of treatment. When this is not successful, chest surgery may be required. This is a much more extensive form of treatment with a longer recovery time. CAUSES  Some of the more common causes of blockage or strictures of the esophagus are:  Narrowing from longstanding inflammation (soreness and redness) of the lower esophagus. This comes from the  constant exposure of the lower esophagus to the acid which bubbles up from the stomach. Over time this causes scarring and narrowing of the lower esophagus.  Hiatal hernia in which a small part of the stomach bulges (herniates) up through the diaphragm. This can cause a gradual narrowing of the end of the esophagus.  Schatzki ring is a narrow ring of benign (non-cancerous) fibrous tissue which constricts the lower esophagus. The reason for this is not known.  Scleroderma is a connective tissue disorder that affects the esophagus and makes swallowing difficult.  Achalasia is an absence of nerves to the lower esophagus and to the esophageal sphincter. This is the circular muscle between the stomach and esophagus that relaxes to allow food into the stomach. After swallowing, it contracts to keep food in the stomach. This absence of nerves may be congenital (present since birth). This can cause irregular spasms of the lower esophageal muscle. This spasm does not open up to allow food and fluid through. The result is a persistent blockage with subsequent slow trickling of the esophageal contents into the stomach.  Strictures may develop from swallowing materials which damage the esophagus. Some examples are strong acids or alkalis such as lye.  Growths such as benign (non-cancerous) and malignant (cancerous) tumors can block the esophagus.  Hereditary (present since birth) causes. DIAGNOSIS  Your caregiver often suspects this problem by taking a medical history. They will also do a physical exam. They can then prove their suspicions using X-rays and endoscopy. Endoscopy is an exam in which a tube like a small, flexible telescope is used to look at your esophagus.  TREATMENT There are different stretching (dilating) techniques that can be used. Simple bougie dilatation may be done in the office. This usually takes only a couple minutes. A numbing (anesthetic) spray of the throat is used. Endoscopy, when  done, is done in an endoscopy suite under mild sedation. When fluoroscopy is used, the procedure is performed in X-ray. Other techniques require a little longer time. Recovery is usually quick. There is no waiting time to begin eating and drinking to test success of the treatment. Following are some of the methods used. Narrowing of the esophagus is treated by making it bigger. Commonly this is a mechanical problem which can be treated with stretching. This can be done in different ways. Your caregiver will discuss these with you. Some of the means used are:  A series of graduated (increasing thickness) flexible dilators can be used. These are weighted tubes passed through the esophagus into the stomach. The tubes used become progressively larger until the desired stretched size is reached. Graduated dilators are a simple and quick way of opening the esophagus. No visualization is required.  Another method is the use of endoscopy to place a flexible wire across the stricture. The endoscope is removed and the wire left in place. A dilator with a hole through it from end to end is guided down the esophagus and across the stricture. One or more of these dilators are passed over the wire. At the end of the exam, the wire is removed. This type of treatment may be performed in the X-ray department under fluoroscopy. An advantage of this procedure is the examiner is visualizing the end opening in the esophagus.  Stretching of the esophagus may be done using balloons. Deflated balloons are placed through the endoscope and across the stricture. This type of balloon dilatation is often done at the time of endoscopy or fluoroscopy. Flexible endoscopy allows the examiner to directly view the stricture. A balloon is inserted in the deflated form into the area of narrowing. It is then inflated with air to a certain pressure that is preset for a given circumference. When inflated, it becomes sausage shaped, stretched, and  makes the stricture larger.  Achalasia requires a longer, larger balloon-type dilator. This is frequently done under X-ray control. In this situation, the spastic muscle fibers in the lower esophagus are stretched. All of the above procedures make the passage of food and water into the stomach easier. They also make it easier for stomach contents to reflux back into the esophagus. Special medications may be used following the procedure to help prevent further stricturing. Proton-pump inhibitor medications are good at decreasing the amount of acid in the stomach juice. When stomach juice refluxes into the esophagus, the juice is no longer as acidic and is less likely to burn or scar the esophagus. RISKS AND COMPLICATIONS Esophageal dilatation is usually performed effectively and without problems. Some complications that can occur are:  A small amount of bleeding almost always happens  where the stretching takes place. If this is too excessive it may require more aggressive treatment.  An uncommon complication is perforation (making a hole) of the esophagus. The esophagus is thin. It is easy to make a hole in it. If this happens, an operation may be necessary to repair this.  A small, undetected perforation could lead to an infection in the chest. This can be very serious. HOME CARE INSTRUCTIONS   If you received sedation for your procedure, do not drive, make important decisions, or perform any activities requiring your full coordination. Do not drink alcohol, take sedatives, or use any mind altering chemicals unless instructed by your caregiver.  You may use throat lozenges or warm salt water gargles if you have throat discomfort.  You can begin eating and drinking normally on return home unless instructed otherwise. Do not purposely try to force large chunks of food down to test the benefits of your procedure.  Mild discomfort can be eased with sips of ice water.  Medications for discomfort may  or may not be needed. SEEK IMMEDIATE MEDICAL CARE IF:   You begin vomiting up blood.  You develop black, tarry stools.  You develop chills or an unexplained temperature of over 101F (38.3C)  You develop chest or abdominal pain.  You develop shortness of breath, or feel light-headed or faint.  Your swallowing is becoming more painful, difficult, or you are unable to swallow. MAKE SURE YOU:   Understand these instructions.  Will watch your condition.  Will get help right away if you are not doing well or get worse. Document Released: 09/16/2005 Document Revised: 12/10/2013 Document Reviewed: 11/03/2005 [facility] Patient Information 2015 [place], [place]. This information is not intended to replace advice given to you by your health care provider. Make sure you discuss any questions you have with your health care provider. PATIENT INSTRUCTIONS POST-ANESTHESIA  IMMEDIATELY FOLLOWING SURGERY:  Do not drive or operate machinery for the first twenty four hours after surgery.  Do not make any important decisions for twenty four hours after surgery or while taking narcotic pain medications or sedatives.  If you develop intractable nausea and vomiting or a severe headache please notify your doctor immediately.  FOLLOW-UP:  Please make an appointment with your surgeon as instructed. You do not need to follow up with anesthesia unless specifically instructed to do so.  WOUND CARE INSTRUCTIONS (if applicable):  Keep a dry clean dressing on the anesthesia/puncture wound site if there is drainage.  Once the wound has quit draining you may leave it open to air.  Generally you should leave the bandage intact for twenty four hours unless there is drainage.  If the epidural site drains for more than 36-48 hours please call the anesthesia department.  QUESTIONS?:  Please feel free to call your physician or the hospital operator if you have any questions, and they will be happy to assist you.

## 2014-11-06 ENCOUNTER — Other Ambulatory Visit: Payer: Self-pay

## 2014-11-06 ENCOUNTER — Encounter (HOSPITAL_COMMUNITY): Payer: Self-pay

## 2014-11-06 ENCOUNTER — Encounter (HOSPITAL_COMMUNITY)
Admission: RE | Admit: 2014-11-06 | Discharge: 2014-11-06 | Disposition: A | Discharge status: Home / Self Care | Payer: Medicare Other | Source: Ambulatory Visit | Attending: Internal Medicine | Admitting: Internal Medicine

## 2014-11-06 VITALS — BP 100/53 | HR 94 | Temp 97.4°F | Resp 18 | Ht 73.5 in | Wt 235.0 lb

## 2014-11-06 DIAGNOSIS — N182 Chronic kidney disease, stage 2 (mild): Secondary | ICD-10-CM | POA: Diagnosis not present

## 2014-11-06 DIAGNOSIS — F419 Anxiety disorder, unspecified: Secondary | ICD-10-CM | POA: Diagnosis not present

## 2014-11-06 DIAGNOSIS — R1310 Dysphagia, unspecified: Secondary | ICD-10-CM

## 2014-11-06 DIAGNOSIS — Z7982 Long term (current) use of aspirin: Secondary | ICD-10-CM | POA: Diagnosis not present

## 2014-11-06 DIAGNOSIS — I252 Old myocardial infarction: Secondary | ICD-10-CM | POA: Diagnosis not present

## 2014-11-06 DIAGNOSIS — Z95810 Presence of automatic (implantable) cardiac defibrillator: Secondary | ICD-10-CM | POA: Diagnosis not present

## 2014-11-06 DIAGNOSIS — I129 Hypertensive chronic kidney disease with stage 1 through stage 4 chronic kidney disease, or unspecified chronic kidney disease: Secondary | ICD-10-CM | POA: Diagnosis not present

## 2014-11-06 DIAGNOSIS — I493 Ventricular premature depolarization: Secondary | ICD-10-CM | POA: Diagnosis not present

## 2014-11-06 DIAGNOSIS — G4730 Sleep apnea, unspecified: Secondary | ICD-10-CM | POA: Diagnosis not present

## 2014-11-06 DIAGNOSIS — K449 Diaphragmatic hernia without obstruction or gangrene: Secondary | ICD-10-CM | POA: Diagnosis not present

## 2014-11-06 DIAGNOSIS — Z87891 Personal history of nicotine dependence: Secondary | ICD-10-CM | POA: Diagnosis not present

## 2014-11-06 DIAGNOSIS — R112 Nausea with vomiting, unspecified: Secondary | ICD-10-CM | POA: Diagnosis not present

## 2014-11-06 DIAGNOSIS — Z8673 Personal history of transient ischemic attack (TIA), and cerebral infarction without residual deficits: Secondary | ICD-10-CM | POA: Diagnosis not present

## 2014-11-06 DIAGNOSIS — I420 Dilated cardiomyopathy: Secondary | ICD-10-CM | POA: Diagnosis not present

## 2014-11-06 DIAGNOSIS — M109 Gout, unspecified: Secondary | ICD-10-CM | POA: Diagnosis not present

## 2014-11-06 DIAGNOSIS — E119 Type 2 diabetes mellitus without complications: Secondary | ICD-10-CM | POA: Diagnosis not present

## 2014-11-06 DIAGNOSIS — K210 Gastro-esophageal reflux disease with esophagitis: Secondary | ICD-10-CM | POA: Diagnosis not present

## 2014-11-06 HISTORY — DX: Sleep apnea, unspecified: G47.30

## 2014-11-06 HISTORY — DX: Cerebral infarction, unspecified: I63.9

## 2014-11-06 HISTORY — DX: Gastro-esophageal reflux disease without esophagitis: K21.9

## 2014-11-06 HISTORY — DX: Anxiety disorder, unspecified: F41.9

## 2014-11-06 HISTORY — DX: Acute myocardial infarction, unspecified: I21.9

## 2014-11-06 LAB — CBC
HCT: 44.4 % (ref 39.0–52.0)
Hemoglobin: 16 g/dL (ref 13.0–17.0)
MCH: 32.3 pg (ref 26.0–34.0)
MCHC: 36 g/dL (ref 30.0–36.0)
MCV: 89.5 fL (ref 78.0–100.0)
Platelets: 118 10*3/uL — ABNORMAL LOW (ref 150–400)
RBC: 4.96 MIL/uL (ref 4.22–5.81)
RDW: 15.1 % (ref 11.5–15.5)
WBC: 3.6 10*3/uL — ABNORMAL LOW (ref 4.0–10.5)

## 2014-11-06 LAB — BASIC METABOLIC PANEL
ANION GAP: 7 (ref 5–15)
BUN: 14 mg/dL (ref 6–23)
CHLORIDE: 102 mmol/L (ref 96–112)
CO2: 28 mmol/L (ref 19–32)
Calcium: 9.5 mg/dL (ref 8.4–10.5)
Creatinine, Ser: 1.49 mg/dL — ABNORMAL HIGH (ref 0.50–1.35)
GFR calc Af Amer: 59 mL/min — ABNORMAL LOW (ref >90)
GFR calc non Af Amer: 51 mL/min — ABNORMAL LOW (ref >90)
GLUCOSE: 220 mg/dL — AB (ref 70–99)
POTASSIUM: 4.5 mmol/L (ref 3.5–5.1)
SODIUM: 137 mmol/L (ref 135–145)

## 2014-11-06 NOTE — Pre-Procedure Instructions (Signed)
Patient given information to sign up for my chart at home. 

## 2014-11-06 NOTE — Pre-Procedure Instructions (Signed)
Patient has implanted cardiac defibrillator. Spoke with Dr Patsey Berthold and since cautery will not be used during procedure, there is no need to contact representative. Information passed on to Onycha, Printmaker.

## 2014-11-07 ENCOUNTER — Encounter: Payer: Self-pay | Admitting: *Deleted

## 2014-11-07 DIAGNOSIS — E1129 Type 2 diabetes mellitus with other diabetic kidney complication: Secondary | ICD-10-CM | POA: Diagnosis not present

## 2014-11-07 DIAGNOSIS — E6609 Other obesity due to excess calories: Secondary | ICD-10-CM | POA: Diagnosis not present

## 2014-11-07 DIAGNOSIS — I10 Essential (primary) hypertension: Secondary | ICD-10-CM | POA: Diagnosis not present

## 2014-11-07 DIAGNOSIS — G894 Chronic pain syndrome: Secondary | ICD-10-CM | POA: Diagnosis not present

## 2014-11-07 DIAGNOSIS — Z6830 Body mass index (BMI) 30.0-30.9, adult: Secondary | ICD-10-CM | POA: Diagnosis not present

## 2014-11-08 ENCOUNTER — Encounter (HOSPITAL_COMMUNITY): Payer: Self-pay

## 2014-11-08 ENCOUNTER — Ambulatory Visit (HOSPITAL_COMMUNITY): Payer: Medicare Other | Admitting: Anesthesiology

## 2014-11-08 ENCOUNTER — Encounter (HOSPITAL_COMMUNITY)
Admission: RE | Disposition: A | Discharge status: Home / Self Care | Payer: Self-pay | Source: Ambulatory Visit | Attending: Internal Medicine

## 2014-11-08 ENCOUNTER — Ambulatory Visit (HOSPITAL_COMMUNITY)
Admission: RE | Admit: 2014-11-08 | Discharge: 2014-11-08 | Disposition: A | Discharge status: Home / Self Care | Payer: Medicare Other | Source: Ambulatory Visit | Attending: Internal Medicine | Admitting: Internal Medicine

## 2014-11-08 ENCOUNTER — Other Ambulatory Visit (INDEPENDENT_AMBULATORY_CARE_PROVIDER_SITE_OTHER): Payer: Self-pay | Admitting: Internal Medicine

## 2014-11-08 DIAGNOSIS — K449 Diaphragmatic hernia without obstruction or gangrene: Secondary | ICD-10-CM | POA: Insufficient documentation

## 2014-11-08 DIAGNOSIS — G4730 Sleep apnea, unspecified: Secondary | ICD-10-CM | POA: Insufficient documentation

## 2014-11-08 DIAGNOSIS — N182 Chronic kidney disease, stage 2 (mild): Secondary | ICD-10-CM | POA: Insufficient documentation

## 2014-11-08 DIAGNOSIS — Z87891 Personal history of nicotine dependence: Secondary | ICD-10-CM | POA: Insufficient documentation

## 2014-11-08 DIAGNOSIS — R112 Nausea with vomiting, unspecified: Secondary | ICD-10-CM | POA: Diagnosis not present

## 2014-11-08 DIAGNOSIS — R1312 Dysphagia, oropharyngeal phase: Secondary | ICD-10-CM | POA: Diagnosis not present

## 2014-11-08 DIAGNOSIS — K210 Gastro-esophageal reflux disease with esophagitis: Secondary | ICD-10-CM | POA: Diagnosis not present

## 2014-11-08 DIAGNOSIS — E119 Type 2 diabetes mellitus without complications: Secondary | ICD-10-CM | POA: Insufficient documentation

## 2014-11-08 DIAGNOSIS — R1310 Dysphagia, unspecified: Secondary | ICD-10-CM | POA: Diagnosis not present

## 2014-11-08 DIAGNOSIS — F419 Anxiety disorder, unspecified: Secondary | ICD-10-CM | POA: Insufficient documentation

## 2014-11-08 DIAGNOSIS — I252 Old myocardial infarction: Secondary | ICD-10-CM | POA: Insufficient documentation

## 2014-11-08 DIAGNOSIS — Z8673 Personal history of transient ischemic attack (TIA), and cerebral infarction without residual deficits: Secondary | ICD-10-CM | POA: Insufficient documentation

## 2014-11-08 DIAGNOSIS — I420 Dilated cardiomyopathy: Secondary | ICD-10-CM | POA: Insufficient documentation

## 2014-11-08 DIAGNOSIS — I493 Ventricular premature depolarization: Secondary | ICD-10-CM | POA: Insufficient documentation

## 2014-11-08 DIAGNOSIS — K2210 Ulcer of esophagus without bleeding: Secondary | ICD-10-CM | POA: Diagnosis not present

## 2014-11-08 DIAGNOSIS — K3184 Gastroparesis: Secondary | ICD-10-CM | POA: Diagnosis not present

## 2014-11-08 DIAGNOSIS — Z7982 Long term (current) use of aspirin: Secondary | ICD-10-CM | POA: Insufficient documentation

## 2014-11-08 DIAGNOSIS — M109 Gout, unspecified: Secondary | ICD-10-CM | POA: Insufficient documentation

## 2014-11-08 DIAGNOSIS — Z95810 Presence of automatic (implantable) cardiac defibrillator: Secondary | ICD-10-CM | POA: Insufficient documentation

## 2014-11-08 DIAGNOSIS — I129 Hypertensive chronic kidney disease with stage 1 through stage 4 chronic kidney disease, or unspecified chronic kidney disease: Secondary | ICD-10-CM | POA: Insufficient documentation

## 2014-11-08 HISTORY — PX: ESOPHAGEAL DILATION: SHX303

## 2014-11-08 HISTORY — PX: ESOPHAGOGASTRODUODENOSCOPY (EGD) WITH PROPOFOL: SHX5813

## 2014-11-08 LAB — GLUCOSE, CAPILLARY: GLUCOSE-CAPILLARY: 141 mg/dL — AB (ref 70–99)

## 2014-11-08 SURGERY — ESOPHAGOGASTRODUODENOSCOPY (EGD) WITH PROPOFOL
Anesthesia: Monitor Anesthesia Care | Site: Esophagus

## 2014-11-08 MED ORDER — ONDANSETRON HCL 4 MG/2ML IJ SOLN
INTRAMUSCULAR | Status: AC
  Filled 2014-11-08: qty 2

## 2014-11-08 MED ORDER — FENTANYL CITRATE 0.05 MG/ML IJ SOLN
INTRAMUSCULAR | Status: AC
  Filled 2014-11-08: qty 2

## 2014-11-08 MED ORDER — FENTANYL CITRATE 0.05 MG/ML IJ SOLN: 25.0000 ug | INTRAMUSCULAR | Status: DC | PRN

## 2014-11-08 MED ORDER — PROPOFOL 10 MG/ML IV BOLUS
INTRAVENOUS | Status: AC
Start: 2014-11-08 — End: 2014-11-08
  Filled 2014-11-08: qty 20

## 2014-11-08 MED ORDER — ONDANSETRON HCL 4 MG/2ML IJ SOLN
4.0000 mg | Freq: Once | INTRAMUSCULAR | Status: AC
  Administered 2014-11-08: 4 mg via INTRAVENOUS

## 2014-11-08 MED ORDER — PROPOFOL INFUSION 10 MG/ML OPTIME
INTRAVENOUS | Status: DC | PRN
  Administered 2014-11-08: 50 ug/kg/min via INTRAVENOUS

## 2014-11-08 MED ORDER — LIDOCAINE HCL (PF) 1 % IJ SOLN
INTRAMUSCULAR | Status: AC
  Filled 2014-11-08: qty 5

## 2014-11-08 MED ORDER — PROPOFOL 10 MG/ML IV BOLUS
INTRAVENOUS | Status: AC
  Filled 2014-11-08: qty 20

## 2014-11-08 MED ORDER — LIDOCAINE HCL (CARDIAC) 10 MG/ML IV SOLN
INTRAVENOUS | Status: DC | PRN
  Administered 2014-11-08: 30 mg via INTRAVENOUS

## 2014-11-08 MED ORDER — MIDAZOLAM HCL 2 MG/2ML IJ SOLN
1.0000 mg | INTRAMUSCULAR | Status: DC | PRN
  Administered 2014-11-08: 2 mg via INTRAVENOUS

## 2014-11-08 MED ORDER — LACTATED RINGERS IV SOLN
INTRAVENOUS | Status: DC
  Administered 2014-11-08: 07:00:00 via INTRAVENOUS

## 2014-11-08 MED ORDER — ONDANSETRON HCL 4 MG/2ML IJ SOLN: 4.0000 mg | Freq: Once | INTRAMUSCULAR | Status: DC | PRN

## 2014-11-08 MED ORDER — STERILE WATER FOR IRRIGATION IR SOLN
Status: DC | PRN
  Administered 2014-11-08: 08:00:00

## 2014-11-08 MED ORDER — FENTANYL CITRATE 0.05 MG/ML IJ SOLN
25.0000 ug | INTRAMUSCULAR | Status: AC
  Administered 2014-11-08 (×2): 25 ug via INTRAVENOUS

## 2014-11-08 MED ORDER — MIDAZOLAM HCL 2 MG/2ML IJ SOLN
INTRAMUSCULAR | Status: AC
  Filled 2014-11-08: qty 2

## 2014-11-08 SURGICAL SUPPLY — 30 items
BALLN CRE LF 10-12 240X5.5 (BALLOONS)
BALLN CRE LF 10-12MM 240X5.5 (BALLOONS)
BALLN DILATOR CRE 12-15 240 (BALLOONS)
BALLN DILATOR CRE 15-18 240 (BALLOONS) IMPLANT
BALLN DILATOR CRE 18-20 240 (BALLOONS) IMPLANT
BALLN DILATOR CRE WIREGUIDE (BALLOONS)
BALLOON CRE LF 10-12 240X5.5 (BALLOONS) IMPLANT
BALLOON DILATOR CRE 12-15 240 (BALLOONS) IMPLANT
BALLOON DILATOR CRE WIREGUIDE (BALLOONS) IMPLANT
BLOCK BITE 60FR ADLT L/F BLUE (MISCELLANEOUS) ×3 IMPLANT
ELECT REM PT RETURN 9FT ADLT (ELECTROSURGICAL)
ELECTRODE REM PT RTRN 9FT ADLT (ELECTROSURGICAL) IMPLANT
FLOOR PAD 36X40 (MISCELLANEOUS) ×3
FORCEP COLD BIOPSY (CUTTING FORCEPS) IMPLANT
FORCEPS BIOP RAD 4 LRG CAP 4 (CUTTING FORCEPS) IMPLANT
FORMALIN 10 PREFIL 20ML (MISCELLANEOUS) IMPLANT
KIT CLEAN ENDO COMPLIANCE (KITS) ×3 IMPLANT
MANIFOLD NEPTUNE II (INSTRUMENTS) ×3 IMPLANT
NEEDLE SCLEROTHERAPY 25GX240 (NEEDLE) IMPLANT
PAD FLOOR 36X40 (MISCELLANEOUS) ×1 IMPLANT
PROBE APC STR FIRE (PROBE) IMPLANT
PROBE INJECTION GOLD (MISCELLANEOUS)
PROBE INJECTION GOLD 7FR (MISCELLANEOUS) IMPLANT
SNARE ROTATE MED OVAL 20MM (MISCELLANEOUS) IMPLANT
SNARE SHORT THROW 13M SML OVAL (MISCELLANEOUS) IMPLANT
SYR 50ML LL SCALE MARK (SYRINGE) IMPLANT
SYR INFLATE BILIARY GAUGE (MISCELLANEOUS) IMPLANT
SYR INFLATION 60ML (SYRINGE) IMPLANT
TUBING IRRIGATION ENDOGATOR (MISCELLANEOUS) ×3 IMPLANT
WATER STERILE IRR 1000ML POUR (IV SOLUTION) ×3 IMPLANT

## 2014-11-08 NOTE — Anesthesia Postprocedure Evaluation (Signed)
  Anesthesia Post-op Note  Patient: Mike Floyd  Procedure(s) Performed: Procedure(s) with comments: ESOPHAGOGASTRODUODENOSCOPY (EGD) WITH PROPOFOL (N/A) - Hiatus is 39 , GE Junction is 37 ESOPHAGEAL DILATION (N/A) - #56,   Patient Location: PACU  Anesthesia Type:MAC  Level of Consciousness: awake and patient cooperative  Airway and Oxygen Therapy: Patient Spontanous Breathing and non-rebreather face mask  Post-op Pain: none  Post-op Assessment: Post-op Vital signs reviewed, Patient's Cardiovascular Status Stable, Respiratory Function Stable, Patent Airway and No signs of Nausea or vomiting  Post-op Vital Signs: Reviewed and stable  Last Vitals:  Filed Vitals:   11/08/14 0800  BP: 82/54  Pulse: 81  Temp:   Resp: 16    Complications: No apparent anesthesia complications

## 2014-11-08 NOTE — Anesthesia Preprocedure Evaluation (Signed)
Anesthesia Evaluation  Patient identified by MRN, date of birth, ID band Patient awake    Reviewed: Allergy & Precautions, NPO status , Patient's Chart, lab work & pertinent test results, reviewed documented beta blocker date and time   Airway Mallampati: III  TM Distance: >3 FB     Dental  (+) Edentulous Upper, Edentulous Lower   Pulmonary shortness of breath and with exertion, sleep apnea , COPDformer smoker,  breath sounds clear to auscultation        Cardiovascular hypertension, Pt. on medications and Pt. on home beta blockers + Past MI and +CHF + dysrhythmias (WPW syndrome s/p RFCA 1999) + Cardiac Defibrillator Rhythm:Regular Rate:Normal     Neuro/Psych PSYCHIATRIC DISORDERS Anxiety TIACVA    GI/Hepatic PUD, GERD-  ,  Endo/Other  diabetes, Type 2  Renal/GU Renal disease     Musculoskeletal   Abdominal   Peds  Hematology   Anesthesia Other Findings   Reproductive/Obstetrics                             Anesthesia Physical Anesthesia Plan  ASA: III  Anesthesia Plan: MAC   Post-op Pain Management:    Induction: Intravenous  Airway Management Planned: Simple Face Mask  Additional Equipment:   Intra-op Plan:   Post-operative Plan:   Informed Consent: I have reviewed the patients History and Physical, chart, labs and discussed the procedure including the risks, benefits and alternatives for the proposed anesthesia with the patient or authorized representative who has indicated his/her understanding and acceptance.     Plan Discussed with:   Anesthesia Plan Comments:         Anesthesia Quick Evaluation

## 2014-11-08 NOTE — Transfer of Care (Addendum)
Immediate Anesthesia Transfer of Care Note  Patient: Mike Floyd  Procedure(s) Performed: Procedure(s) (LRB): ESOPHAGOGASTRODUODENOSCOPY (EGD) WITH PROPOFOL (N/A) ESOPHAGEAL DILATION (N/A)  Patient Location: PACU  Anesthesia Type: MAC  Level of Consciousness: awake  Airway & Oxygen Therapy: Patient Spontanous Breathing. Non Rebreather. Post-op Assessment: Report given to PACU RN, Post -op Vital signs reviewed and stable and Patient moving all extremities  Post vital signs: Reviewed and stable  Complications: No apparent anesthesia complications

## 2014-11-08 NOTE — H&P (Signed)
Mike Floyd is an 53 y.o. male.   Chief Complaint: Patient's here for EGD and ED. HPI: Patient is 54 year old Caucasian male with multiple medical problems including diabetes mellitus see daily and GERD who presents with recurrent solid food dysphagia who presents with solid dysphagia occurring every day. He also complains of nausea and vomiting. He denies melena or rectal bleeding or abdominal pain. He underwent EGD EGD in generally 2013 revealing erosive reflux esophagitis and Schatzki's ring. Follow-up EGD in August 2013 revealed worsening GERD.  Past Medical History  Diagnosis Date  . Diabetes mellitus DX: 2010  . Gout   . Hypertension   . CKD (chronic kidney disease), stage II   . Erosive esophagitis     a. per EGD (08/2011), Dr. Laural Golden - Erosive reflux esophagitis improved but not completely healed since previous EGD 3 years ago. Bx showing  ulcerated gatroesophageal junction mucosa. negative for H. pylori  . Nonischemic dilated cardiomyopathy     a. H/O EF as low as 35-40% by LV gram 10/2007;  b. Echo 02/2011 EF 50-55%, inf HK, Gr 1 DD   . DDD (degenerative disc disease), lumbar   . Hearing deficit     a. wear bilateral hearing aides  . AICD (automatic cardioverter/defibrillator) present 11/2007    a. 11/2007 [facility] Current VR - single lead ICD  . Chest pain     a. 10/2007 Cath:  normal Cors.  . WPW (Wolff-Parkinson-White syndrome)     a. s/p RFCA @ [facility]  . TIA (transient ischemic attack)     July, 2013  . Tobacco abuse, in remission 06/27/2009    Discontinued in 2009    . Stroke     mini-stroke in 2014  . Myocardial infarction 2011  . Sleep apnea     pt doesnt use, states"I cant afford one". PCP aware  . GERD (gastroesophageal reflux disease)   . Anxiety     Past Surgical History  Procedure Laterality Date  . Back surgery      1995  . Appendectomy    . Cholecystectomy    . Radiofrequency ablation  1999    WPW; performed at [facility]  . Elbow surgery Left  06/2010  . Cholecystectomy, laparoscopic      11/2007  . Esophagogastroduodenoscopy  03/31/2012    also 08/2011; Mike Floyd  . Colonoscopy w/ polypectomy  2009  . Cardiac defibrillator placement      Family History  Problem Relation Age of Onset  . Heart attack Mother 58  . Hypertension Mother   . Diabetes Mother   . Kidney disease Mother   . Breast cancer Mother   . Heart attack Father 78  . Hypertension Father   . Diabetes Father   . Heart attack Brother 3  . Stroke Maternal Grandmother 80  . Diabetes Brother   . Hypertension Brother   . Stroke Brother    Social History:  reports that he quit smoking about 4 years ago. His smoking use included Cigarettes. He started smoking about 43 years ago. He has a 52.5 pack-year smoking history. He quit smokeless tobacco use about 20 years ago. His smokeless tobacco use included Snuff. He reports that he does not drink alcohol or use illicit drugs.  Allergies: No Known Allergies  Medications Prior to Admission  Medication Sig Dispense Refill  . allopurinol (ZYLOPRIM) 300 MG tablet Take 300 mg by mouth daily.     Marland Kitchen amitriptyline (ELAVIL) 50 MG tablet Take 1 tablet by  mouth at bedtime.    Marland Kitchen aspirin 81 MG tablet Take 81 mg by mouth daily.    Marland Kitchen atorvastatin (LIPITOR) 20 MG tablet Take 20 mg by mouth daily.    . carvedilol (COREG) 25 MG tablet Take 2 tablets (50 mg total) by mouth 2 (two) times daily. 120 tablet 6  . clopidogrel (PLAVIX) 75 MG tablet Take 75 mg by mouth daily.    . colchicine 0.6 MG tablet Take 0.6 mg by mouth 2 (two) times daily.    . digoxin (LANOXIN) 0.125 MG tablet Take 0.125 mg by mouth daily.    . furosemide (LASIX) 40 MG tablet Take 20 mg by mouth daily.    Marland Kitchen glimepiride (AMARYL) 4 MG tablet Take 4 mg by mouth daily before breakfast.    . isosorbide mononitrate (IMDUR) 30 MG 24 hr tablet TAKE ONE TABLET BY MOUTH ONCE DAILY. 30 tablet 6  . L-Methylfolate-B6-B12 (FOLTANX) 3-35-2 MG TABS Take 1 tablet by mouth 2 (two) times  daily.    Marland Kitchen lisinopril (PRINIVIL,ZESTRIL) 5 MG tablet Take 1 tablet (5 mg total) by mouth daily. 30 tablet 6  . LORazepam (ATIVAN) 1 MG tablet Take 1 mg by mouth 3 (three) times daily.     Marland Kitchen omeprazole (PRILOSEC) 40 MG capsule TAKE (1) CAPSULE BY MOUTH ONCE DAILY. 30 capsule 6  . oxycodone (ROXICODONE) 30 MG immediate release tablet Take 1-2 tablets by mouth 4 (four) times daily as needed.     . sucralfate (CARAFATE) 1 G tablet TAKE 1 TABLET BY MOUTH 30 MINTUES BEFORE MEALS AND AT BEDTIME. 120 tablet 4    Results for orders placed or performed during the hospital encounter of 11/08/14 (from the past 48 hour(s))  Glucose, capillary     Status: Abnormal   Collection Time: 11/08/14  6:44 AM  Result Value Ref Range   Glucose-Capillary 141 (H) 70 - 99 mg/dL   No results found.  ROS  Blood pressure 97/65, pulse 89, temperature 97.8 F (36.6 C), temperature source Oral, resp. rate 17. Physical Exam  Constitutional: He appears well-developed and well-nourished.  HENT:  Mouth/Throat: Oropharynx is clear and moist.  Eyes: Conjunctivae are normal. No scleral icterus.  Neck: No thyromegaly present.  Cardiovascular: Normal rate, regular rhythm and normal heart sounds.   No murmur heard. Respiratory: Effort normal and breath sounds normal.  GI: Soft. He exhibits no distension and no mass. There is no tenderness.  Musculoskeletal: He exhibits no edema.  Lymphadenopathy:    He has no cervical adenopathy.  Neurological: He is alert.  Skin: Skin is warm and dry.     Assessment/Plan Solid food dysphagia in patient with chronic GERD. Nausea and vomiting. Possible gastroparesis secondary to diabetes and pain medications. EGD with ED  Mike Floyd U 11/08/2014, 7:21 AM

## 2014-11-08 NOTE — Discharge Instructions (Signed)
Resume usual medications. Gastroparesis diet( diet sheet). No driving for 24 hours. Will schedule for solid-phase gastric emptying study. Office will call.  Gastrointestinal Endoscopy, Care After Refer to this sheet in the next few weeks. These instructions provide you with information on caring for yourself after your procedure. Your caregiver may also give you more specific instructions. Your treatment has been planned according to current medical practices, but problems sometimes occur. Call your caregiver if you have any problems or questions after your procedure. HOME CARE INSTRUCTIONS  If you were given medicine to help you relax (sedative), do not drive, operate machinery, or sign important documents for 24 hours.  Avoid alcohol and hot or warm beverages for the first 24 hours after the procedure.  Only take over-the-counter or prescription medicines for pain, discomfort, or fever as directed by your caregiver. You may resume taking your normal medicines unless your caregiver tells you otherwise. Ask your caregiver when you may resume taking medicines that may cause bleeding, such as aspirin, clopidogrel, or warfarin.  You may return to your normal diet and activities on the day after your procedure, or as directed by your caregiver. Walking may help to reduce any bloated feeling in your abdomen.  Drink enough fluids to keep your urine clear or pale yellow.  You may gargle with salt water if you have a sore throat. SEEK IMMEDIATE MEDICAL CARE IF:  You have severe nausea or vomiting.  You have severe abdominal pain, abdominal cramps that last longer than 6 hours, or abdominal swelling (distention).  You have severe shoulder or back pain.  You have trouble swallowing.  You have shortness of breath, your breathing is shallow, or you are breathing faster than normal.  You have a fever or a rapid heartbeat.  You vomit blood or material that looks like coffee grounds.  You have  bloody, black, or tarry stools. MAKE SURE YOU:  Understand these instructions.  Will watch your condition.  Will get help right away if you are not doing well or get worse. Document Released: 03/09/2004 Document Revised: 12/10/2013 Document Reviewed: 10/26/2011 [facility] Patient Information 2015 [place], [place]. This information is not intended to replace advice given to you by your health care provider. Make sure you discuss any questions you have with your health care provider.

## 2014-11-08 NOTE — Op Note (Signed)
EGD PROCEDURE REPORT  PATIENT:  Mike Floyd  MR#:  [id number] Birthdate:  09/13/1960, 54 y.o., male Endoscopist:  Dr. Rogene Houston, MD Referred By:  Dr. Glo Herring, MD  Procedure Date: 11/08/2014  Procedure:   EGD with ED  Indications:  Patient is 54 year old Caucasian male with multiple medical problems including chronic GERD and history of erosive reflux esophagitis and Schatzki's ring who presents with solid food dysphagia and intermittent nausea and vomiting. Most recent EGD was in August 2013 when esophagus was not dilated because he had esophageal ulceration.            Informed Consent:  The risks, benefits, alternatives & imponderables which include, but are not limited to, bleeding, infection, perforation, drug reaction and potential missed lesion have been reviewed.  The potential for biopsy, lesion removal, esophageal dilation, etc. have also been discussed.  Questions have been answered.  All parties agreeable.  Please see history & physical in medical record for more information.  Medications:  Monitored anesthesia care. Cetacaine spray topically for oropharyngeal anesthesia  Description of procedure:  The endoscope was introduced through the mouth and advanced to the second portion of the duodenum without difficulty or limitations. The mucosal surfaces were surveyed very carefully during advancement of the scope and upon withdrawal.  Findings:  Esophagus:  Mucosa of the esophagus was normal. Small erosion noted at GE junction and just distal to it. No ring or stricture was present. GEJ:  37 cm Hiatus:  39 cm Stomach:  Moderate amount of food debris noted in the stomach. It distended well with insufflation. Part of the mucosa that was seen at gastric body was normal. Antral mucosa was also normal. Pyloric channel was patent. Angularis fundus and cardia were normal. Duodenum:  Normal bulbar and post bulbar mucosa.  Therapeutic/Diagnostic Maneuvers Performed:    Esophagus dilated by passing 22 French Maloney dilator to full insertion. Esophageal mucosa was reexamined post dilation and no disruption noted.  Complications:  None  Impression: Erosive reflux esophagitis without ring or stricture formation. Significant improvement in severity of esophagitis since last EGD of August 2013. Small sliding hiatal hernia. Moderate amount of food debris in the stomach suggestive of gastroparesis. Esophagus dilated by passing 56 French Maloney dilator but no mucosal disruption induced.  Recommendations:  Standard instructions given. Gastroparesis diet. Solid-phase gastric empty study to be scheduled next week.  Maylene Crocker U  11/08/2014  7:56 AM  CC: Dr. Glo Herring., MD & Dr. Rayne Du ref. provider found

## 2014-11-08 NOTE — Anesthesia Procedure Notes (Signed)
Procedure Name: MAC Date/Time: 11/08/2014 7:29 AM Performed by: Vista Deck Pre-anesthesia Checklist: Patient identified, Emergency Drugs available, Suction available, Timeout performed and Patient being monitored Patient Re-evaluated:Patient Re-evaluated prior to inductionOxygen Delivery Method: Non-rebreather mask

## 2014-11-11 LAB — GLUCOSE, CAPILLARY: Glucose-Capillary: 114 mg/dL — ABNORMAL HIGH (ref 70–99)

## 2014-11-12 ENCOUNTER — Ambulatory Visit (HOSPITAL_COMMUNITY)
Admit: 2014-11-12 | Discharge: 2014-11-12 | Disposition: A | Discharge status: Home / Self Care | Payer: Medicare Other | Source: Ambulatory Visit | Attending: Internal Medicine | Admitting: Internal Medicine

## 2014-11-12 ENCOUNTER — Encounter (HOSPITAL_COMMUNITY): Payer: Self-pay

## 2014-11-12 DIAGNOSIS — Z87891 Personal history of nicotine dependence: Secondary | ICD-10-CM | POA: Diagnosis not present

## 2014-11-12 DIAGNOSIS — R112 Nausea with vomiting, unspecified: Secondary | ICD-10-CM | POA: Insufficient documentation

## 2014-11-12 DIAGNOSIS — R109 Unspecified abdominal pain: Secondary | ICD-10-CM | POA: Insufficient documentation

## 2014-11-12 DIAGNOSIS — K219 Gastro-esophageal reflux disease without esophagitis: Secondary | ICD-10-CM | POA: Insufficient documentation

## 2014-11-12 DIAGNOSIS — E119 Type 2 diabetes mellitus without complications: Secondary | ICD-10-CM | POA: Diagnosis not present

## 2014-11-12 MED ORDER — TECHNETIUM TC 99M SULFUR COLLOID
2.0000 | Freq: Once | INTRAVENOUS | Status: AC | PRN
  Administered 2014-11-12: 2 via ORAL

## 2014-11-15 ENCOUNTER — Other Ambulatory Visit (INDEPENDENT_AMBULATORY_CARE_PROVIDER_SITE_OTHER): Payer: Self-pay | Admitting: Internal Medicine

## 2014-11-15 MED ORDER — METOCLOPRAMIDE HCL 10 MG PO TABS: ORAL_TABLET | ORAL | Status: DC

## 2014-11-21 ENCOUNTER — Encounter (INDEPENDENT_AMBULATORY_CARE_PROVIDER_SITE_OTHER): Payer: Self-pay

## 2014-11-29 ENCOUNTER — Other Ambulatory Visit (INDEPENDENT_AMBULATORY_CARE_PROVIDER_SITE_OTHER): Payer: Self-pay | Admitting: Internal Medicine

## 2014-12-04 ENCOUNTER — Other Ambulatory Visit: Payer: Self-pay | Admitting: Cardiovascular Disease

## 2014-12-04 ENCOUNTER — Encounter: Payer: Self-pay | Admitting: *Deleted

## 2014-12-30 ENCOUNTER — Other Ambulatory Visit (INDEPENDENT_AMBULATORY_CARE_PROVIDER_SITE_OTHER): Payer: Self-pay | Admitting: Internal Medicine

## 2014-12-31 DIAGNOSIS — I10 Essential (primary) hypertension: Secondary | ICD-10-CM | POA: Diagnosis not present

## 2014-12-31 DIAGNOSIS — E6609 Other obesity due to excess calories: Secondary | ICD-10-CM | POA: Diagnosis not present

## 2014-12-31 DIAGNOSIS — Z6830 Body mass index (BMI) 30.0-30.9, adult: Secondary | ICD-10-CM | POA: Diagnosis not present

## 2014-12-31 DIAGNOSIS — E1140 Type 2 diabetes mellitus with diabetic neuropathy, unspecified: Secondary | ICD-10-CM | POA: Diagnosis not present

## 2014-12-31 DIAGNOSIS — J019 Acute sinusitis, unspecified: Secondary | ICD-10-CM | POA: Diagnosis not present

## 2014-12-31 DIAGNOSIS — J209 Acute bronchitis, unspecified: Secondary | ICD-10-CM | POA: Diagnosis not present

## 2014-12-31 DIAGNOSIS — G894 Chronic pain syndrome: Secondary | ICD-10-CM | POA: Diagnosis not present

## 2014-12-31 DIAGNOSIS — E1129 Type 2 diabetes mellitus with other diabetic kidney complication: Secondary | ICD-10-CM | POA: Diagnosis not present

## 2015-01-06 DIAGNOSIS — Z6830 Body mass index (BMI) 30.0-30.9, adult: Secondary | ICD-10-CM | POA: Diagnosis not present

## 2015-01-06 DIAGNOSIS — I10 Essential (primary) hypertension: Secondary | ICD-10-CM | POA: Diagnosis not present

## 2015-01-06 DIAGNOSIS — E6609 Other obesity due to excess calories: Secondary | ICD-10-CM | POA: Diagnosis not present

## 2015-01-06 DIAGNOSIS — N183 Chronic kidney disease, stage 3 (moderate): Secondary | ICD-10-CM | POA: Diagnosis not present

## 2015-01-06 DIAGNOSIS — E119 Type 2 diabetes mellitus without complications: Secondary | ICD-10-CM | POA: Diagnosis not present

## 2015-01-10 ENCOUNTER — Encounter: Payer: Self-pay | Admitting: *Deleted

## 2015-01-21 DIAGNOSIS — L851 Acquired keratosis [keratoderma] palmaris et plantaris: Secondary | ICD-10-CM | POA: Diagnosis not present

## 2015-01-21 DIAGNOSIS — B351 Tinea unguium: Secondary | ICD-10-CM | POA: Diagnosis not present

## 2015-01-21 DIAGNOSIS — E1342 Other specified diabetes mellitus with diabetic polyneuropathy: Secondary | ICD-10-CM | POA: Diagnosis not present

## 2015-02-17 DIAGNOSIS — I10 Essential (primary) hypertension: Secondary | ICD-10-CM | POA: Diagnosis not present

## 2015-02-17 DIAGNOSIS — E039 Hypothyroidism, unspecified: Secondary | ICD-10-CM | POA: Diagnosis not present

## 2015-02-17 DIAGNOSIS — E119 Type 2 diabetes mellitus without complications: Secondary | ICD-10-CM | POA: Diagnosis not present

## 2015-02-17 DIAGNOSIS — G894 Chronic pain syndrome: Secondary | ICD-10-CM | POA: Diagnosis not present

## 2015-02-17 DIAGNOSIS — E1140 Type 2 diabetes mellitus with diabetic neuropathy, unspecified: Secondary | ICD-10-CM | POA: Diagnosis not present

## 2015-02-17 DIAGNOSIS — N400 Enlarged prostate without lower urinary tract symptoms: Secondary | ICD-10-CM | POA: Diagnosis not present

## 2015-02-17 DIAGNOSIS — Z125 Encounter for screening for malignant neoplasm of prostate: Secondary | ICD-10-CM | POA: Diagnosis not present

## 2015-02-17 DIAGNOSIS — Z1389 Encounter for screening for other disorder: Secondary | ICD-10-CM | POA: Diagnosis not present

## 2015-02-17 DIAGNOSIS — E6609 Other obesity due to excess calories: Secondary | ICD-10-CM | POA: Diagnosis not present

## 2015-02-17 DIAGNOSIS — Z6830 Body mass index (BMI) 30.0-30.9, adult: Secondary | ICD-10-CM | POA: Diagnosis not present

## 2015-02-17 DIAGNOSIS — Z6831 Body mass index (BMI) 31.0-31.9, adult: Secondary | ICD-10-CM | POA: Diagnosis not present

## 2015-03-21 DIAGNOSIS — R3911 Hesitancy of micturition: Secondary | ICD-10-CM | POA: Insufficient documentation

## 2015-03-21 DIAGNOSIS — R109 Unspecified abdominal pain: Secondary | ICD-10-CM | POA: Diagnosis not present

## 2015-03-21 DIAGNOSIS — N320 Bladder-neck obstruction: Secondary | ICD-10-CM | POA: Diagnosis not present

## 2015-03-21 DIAGNOSIS — R300 Dysuria: Secondary | ICD-10-CM | POA: Insufficient documentation

## 2015-03-21 DIAGNOSIS — N200 Calculus of kidney: Secondary | ICD-10-CM | POA: Diagnosis not present

## 2015-03-31 ENCOUNTER — Other Ambulatory Visit: Payer: Self-pay | Admitting: Cardiovascular Disease

## 2015-04-04 DIAGNOSIS — N183 Chronic kidney disease, stage 3 unspecified: Secondary | ICD-10-CM | POA: Insufficient documentation

## 2015-04-04 DIAGNOSIS — N200 Calculus of kidney: Secondary | ICD-10-CM | POA: Diagnosis not present

## 2015-04-04 DIAGNOSIS — R300 Dysuria: Secondary | ICD-10-CM | POA: Diagnosis not present

## 2015-04-04 DIAGNOSIS — N289 Disorder of kidney and ureter, unspecified: Secondary | ICD-10-CM | POA: Diagnosis not present

## 2015-04-04 DIAGNOSIS — R109 Unspecified abdominal pain: Secondary | ICD-10-CM | POA: Diagnosis not present

## 2015-04-04 DIAGNOSIS — N320 Bladder-neck obstruction: Secondary | ICD-10-CM | POA: Diagnosis not present

## 2015-04-04 DIAGNOSIS — R3911 Hesitancy of micturition: Secondary | ICD-10-CM | POA: Diagnosis not present

## 2015-04-10 ENCOUNTER — Ambulatory Visit (INDEPENDENT_AMBULATORY_CARE_PROVIDER_SITE_OTHER): Payer: Medicare Other | Admitting: Internal Medicine

## 2015-04-10 ENCOUNTER — Encounter: Payer: Self-pay | Admitting: Internal Medicine

## 2015-04-10 VITALS — BP 100/76 | HR 85 | Ht 73.0 in | Wt 236.0 lb

## 2015-04-10 DIAGNOSIS — I5022 Chronic systolic (congestive) heart failure: Secondary | ICD-10-CM

## 2015-04-10 DIAGNOSIS — I428 Other cardiomyopathies: Secondary | ICD-10-CM

## 2015-04-10 DIAGNOSIS — I10 Essential (primary) hypertension: Secondary | ICD-10-CM

## 2015-04-10 DIAGNOSIS — Z95810 Presence of automatic (implantable) cardiac defibrillator: Secondary | ICD-10-CM | POA: Diagnosis not present

## 2015-04-10 DIAGNOSIS — I429 Cardiomyopathy, unspecified: Secondary | ICD-10-CM

## 2015-04-10 LAB — CUP PACEART INCLINIC DEVICE CHECK
Brady Statistic RV Percent Paced: 0 %
Date Time Interrogation Session: 20160901133556
HighPow Impedance: 73.125
Lead Channel Impedance Value: 362.5 Ohm
Lead Channel Pacing Threshold Amplitude: 1 V
Lead Channel Pacing Threshold Amplitude: 1 V
Lead Channel Pacing Threshold Pulse Width: 0.5 ms
Lead Channel Sensing Intrinsic Amplitude: 9.7 mV
MDC IDC MSMT BATTERY REMAINING LONGEVITY: 34.8 mo
MDC IDC MSMT BATTERY VOLTAGE: 2.57 V
MDC IDC MSMT LEADCHNL RV PACING THRESHOLD PULSEWIDTH: 0.5 ms
MDC IDC SET LEADCHNL RV PACING AMPLITUDE: 2.5 V
MDC IDC SET LEADCHNL RV PACING PULSEWIDTH: 0.5 ms
MDC IDC SET LEADCHNL RV SENSING SENSITIVITY: 0.3 mV
MDC IDC SET ZONE DETECTION INTERVAL: 250 ms
MDC IDC SET ZONE DETECTION INTERVAL: 280 ms
Pulse Gen Serial Number: 536141
Zone Setting Detection Interval: 315 ms

## 2015-04-10 NOTE — Assessment & Plan Note (Signed)
His St. Jude device is working normally. Will recheck in several months.  

## 2015-04-10 NOTE — Assessment & Plan Note (Signed)
His symptoms remain class 2-3 but he is limited by his back as well. He will continue his current medications.

## 2015-04-10 NOTE — Patient Instructions (Signed)
Your physician wants you to follow-up in: 12 months with Dr. Lovena Le. You will receive a reminder letter in the mail two months in advance. If you don't receive a letter, please call our office to schedule the follow-up appointment.  Remote monitoring is used to monitor your Pacemaker of ICD from home. This monitoring reduces the number of office visits required to check your device to one time per year. It allows [place] to keep an eye on the functioning of your device to ensure it is working properly. You are scheduled for a device check from home on 07/10/15. You may send your transmission at any time that day. If you have a wireless device, the transmission will be sent automatically. After your physician reviews your transmission, you will receive a postcard with your next transmission date.  Your physician recommends that you continue on your current medications as directed. Please refer to the Current Medication list given to you today.  Thank you for choosing [facility]!

## 2015-04-10 NOTE — Progress Notes (Signed)
HPI [name] returns today for followup. He is a very pleasant 54 year old man with a nonischemic cardiomyopathy, chronic congestive heart failure, status post ICD implantation. In the interim, the patient notes PND and orthopnea. He has not been on a diuretic except for hydrochlorothiazide. He noted that by starting back on his digoxin, his symptoms improved. He denies chest pain or anginal symptoms. No peripheral edema. He has had no ICD shock, and denies syncope. No Known Allergies   Current Outpatient Prescriptions  Medication Sig Dispense Refill  . allopurinol (ZYLOPRIM) 300 MG tablet Take 300 mg by mouth daily.     Marland Kitchen amitriptyline (ELAVIL) 50 MG tablet Take 1 tablet by mouth at bedtime.    Marland Kitchen aspirin 81 MG tablet Take 81 mg by mouth daily.    Marland Kitchen atorvastatin (LIPITOR) 20 MG tablet Take 20 mg by mouth daily.    . carvedilol (COREG) 25 MG tablet TAKE 2 TABLETS BY MOUTH TWICE DAILY. 60 tablet 0  . clopidogrel (PLAVIX) 75 MG tablet Take 75 mg by mouth daily.    . colchicine 0.6 MG tablet Take 0.6 mg by mouth 2 (two) times daily.    . digoxin (LANOXIN) 0.125 MG tablet Take 0.125 mg by mouth daily.    . furosemide (LASIX) 40 MG tablet Take 20 mg by mouth daily.    Marland Kitchen gabapentin (NEURONTIN) 100 MG capsule Take 100 mg by mouth 2 (two) times daily. 2 tabs twice daily    . glimepiride (AMARYL) 4 MG tablet Take 4 mg by mouth daily before breakfast.    . isosorbide mononitrate (IMDUR) 30 MG 24 hr tablet TAKE ONE TABLET BY MOUTH ONCE DAILY. 30 tablet 6  . L-Methylfolate-B6-B12 (FOLTANX) 3-35-2 MG TABS Take 1 tablet by mouth 2 (two) times daily.    Marland Kitchen lisinopril (PRINIVIL,ZESTRIL) 5 MG tablet TAKE ONE TABLET BY MOUTH ONCE DAILY. 30 tablet 3  . LORazepam (ATIVAN) 1 MG tablet Take 1 mg by mouth 3 (three) times daily.     . metoCLOPramide (REGLAN) 10 MG tablet TAKE 1 TABLET BY MOUTH 30 MINUTES BEFORE BREAKFAST AND EVENING MEAL DAILY. 60 tablet 2  . omeprazole (PRILOSEC) 40 MG capsule TAKE (1) CAPSULE BY  MOUTH ONCE DAILY. 30 capsule 6  . oxycodone (ROXICODONE) 30 MG immediate release tablet Take 1-2 tablets by mouth 4 (four) times daily as needed.     . sucralfate (CARAFATE) 1 G tablet TAKE 1 TABLET BY MOUTH 30 MINTUES BEFORE MEALS AND AT BEDTIME. 120 tablet 3   No current facility-administered medications for this visit.     Past Medical History  Diagnosis Date  . Diabetes mellitus DX: 2010  . Gout   . Hypertension   . CKD (chronic kidney disease), stage II   . Erosive esophagitis     a. per EGD (08/2011), Dr. Laural Golden - Erosive reflux esophagitis improved but not completely healed since previous EGD 3 years ago. Bx showing  ulcerated gatroesophageal junction mucosa. negative for H. pylori  . Nonischemic dilated cardiomyopathy     a. H/O EF as low as 35-40% by LV gram 10/2007;  b. Echo 02/2011 EF 50-55%, inf HK, Gr 1 DD   . DDD (degenerative disc disease), lumbar   . Hearing deficit     a. wear bilateral hearing aides  . AICD (automatic cardioverter/defibrillator) present 11/2007    a. 11/2007 [facility] Current VR - single lead ICD  . Chest pain     a. 10/2007 Cath:  normal Cors.  . WPW (Wolff-Parkinson-White  syndrome)     a. s/p RFCA @ [place]  . TIA (transient ischemic attack)     July, 2013  . Tobacco abuse, in remission 06/27/2009    Discontinued in 2009    . Stroke     mini-stroke in 2014  . Myocardial infarction 2011  . Sleep apnea     pt doesnt use, states"I cant afford one". PCP aware  . GERD (gastroesophageal reflux disease)   . Anxiety     ROS:   All systems reviewed and negative except as noted in the HPI.   Past Surgical History  Procedure Laterality Date  . Back surgery      1995  . Appendectomy    . Cholecystectomy    . Radiofrequency ablation  1999    WPW; performed at [facility]  . Elbow surgery Left 06/2010  . Cholecystectomy, laparoscopic      11/2007  . Esophagogastroduodenoscopy  03/31/2012    also 08/2011; [name]  . Colonoscopy w/ polypectomy   2009  . Cardiac defibrillator placement    . Esophagogastroduodenoscopy (egd) with propofol N/A 11/08/2014    Procedure: ESOPHAGOGASTRODUODENOSCOPY (EGD) WITH PROPOFOL;  Surgeon: Rogene Houston, MD;  Location: [facility];  Service: Endoscopy;  Laterality: N/A;  Hiatus is 39 , GE Junction is 37  . Esophageal dilation N/A 11/08/2014    Procedure: ESOPHAGEAL DILATION;  Surgeon: Rogene Houston, MD;  Location: [facility];  Service: Endoscopy;  Laterality: N/A;  #56,      Family History  Problem Relation Age of Onset  . Heart attack Mother 53  . Hypertension Mother   . Diabetes Mother   . Kidney disease Mother   . Breast cancer Mother   . Heart attack Father 34  . Hypertension Father   . Diabetes Father   . Heart attack Brother 86  . Stroke Maternal Grandmother 80  . Diabetes Brother   . Hypertension Brother   . Stroke Brother      Social History   Social History  . Marital Status: Married    Spouse Name: N/A  . Number of Children: N/A  . Years of Education: N/A   Occupational History  . Worked for [employer] off in 11/11   Social History Main Topics  . Smoking status: Former Smoker -- 1.50 packs/day for 35 years    Types: Cigarettes    Start date: 06/30/1971    Quit date: 07/30/2010  . Smokeless tobacco: Former Systems developer    Types: Snuff    Quit date: 03/26/1994     Comment: currently uses a vape 11-06-2014.  Marland Kitchen Alcohol Use: No     Comment: used "years ago"  . Drug Use: No  . Sexual Activity: No   Other Topics Concern  . Not on file   Social History Narrative     BP 100/76 mmHg  Pulse 85  Ht 6\' 1"  (1.854 m)  Wt 236 lb (107.049 kg)  BMI 31.14 kg/m2  SpO2 96%  Physical Exam:  Well appearing middle-aged man, NAD HEENT: Unremarkable Neck:  7 cm JVD, no thyromegally Back:  No CVA tenderness Lungs:  Clear except for scattered basilar rales, no wheezes, no rhonchi. HEART:  Regular rate rhythm, no murmurs, no rubs, no clicks Abd:  soft, positive bowel sounds, no  organomegally, no rebound, no guarding Ext:  2 plus pulses, no edema, no cyanosis, no clubbing Skin:  No rashes no nodules Neuro:  CN II through XII  intact, motor grossly intact  DEVICE  Normal device function.  See PaceArt for details.   Assess/Plan:

## 2015-04-10 NOTE — Assessment & Plan Note (Signed)
His blood pressure is well controlled. No change in meds.  

## 2015-04-23 ENCOUNTER — Encounter: Payer: Self-pay | Admitting: Internal Medicine

## 2015-04-24 ENCOUNTER — Other Ambulatory Visit: Payer: Self-pay | Admitting: Cardiovascular Disease

## 2015-04-28 ENCOUNTER — Encounter (INDEPENDENT_AMBULATORY_CARE_PROVIDER_SITE_OTHER): Payer: Self-pay | Admitting: *Deleted

## 2015-05-14 DIAGNOSIS — Z1389 Encounter for screening for other disorder: Secondary | ICD-10-CM | POA: Diagnosis not present

## 2015-05-14 DIAGNOSIS — E6609 Other obesity due to excess calories: Secondary | ICD-10-CM | POA: Diagnosis not present

## 2015-05-14 DIAGNOSIS — Z6830 Body mass index (BMI) 30.0-30.9, adult: Secondary | ICD-10-CM | POA: Diagnosis not present

## 2015-05-19 ENCOUNTER — Other Ambulatory Visit: Payer: Self-pay | Admitting: Cardiovascular Disease

## 2015-06-03 ENCOUNTER — Ambulatory Visit (INDEPENDENT_AMBULATORY_CARE_PROVIDER_SITE_OTHER): Payer: Medicare Other | Admitting: Internal Medicine

## 2015-06-05 ENCOUNTER — Other Ambulatory Visit: Payer: Self-pay | Admitting: Cardiology

## 2015-06-20 DIAGNOSIS — E1140 Type 2 diabetes mellitus with diabetic neuropathy, unspecified: Secondary | ICD-10-CM | POA: Diagnosis not present

## 2015-06-20 DIAGNOSIS — I5022 Chronic systolic (congestive) heart failure: Secondary | ICD-10-CM | POA: Diagnosis not present

## 2015-06-20 DIAGNOSIS — E119 Type 2 diabetes mellitus without complications: Secondary | ICD-10-CM | POA: Diagnosis not present

## 2015-06-20 DIAGNOSIS — Q6689 Other  specified congenital deformities of feet: Secondary | ICD-10-CM | POA: Diagnosis not present

## 2015-06-20 DIAGNOSIS — L84 Corns and callosities: Secondary | ICD-10-CM | POA: Diagnosis not present

## 2015-06-20 DIAGNOSIS — E6609 Other obesity due to excess calories: Secondary | ICD-10-CM | POA: Diagnosis not present

## 2015-06-20 DIAGNOSIS — G894 Chronic pain syndrome: Secondary | ICD-10-CM | POA: Diagnosis not present

## 2015-06-20 DIAGNOSIS — M24576 Contracture, unspecified foot: Secondary | ICD-10-CM | POA: Diagnosis not present

## 2015-06-20 DIAGNOSIS — Z6830 Body mass index (BMI) 30.0-30.9, adult: Secondary | ICD-10-CM | POA: Diagnosis not present

## 2015-06-20 DIAGNOSIS — I10 Essential (primary) hypertension: Secondary | ICD-10-CM | POA: Diagnosis not present

## 2015-06-20 DIAGNOSIS — Z1389 Encounter for screening for other disorder: Secondary | ICD-10-CM | POA: Diagnosis not present

## 2015-07-07 DIAGNOSIS — E1140 Type 2 diabetes mellitus with diabetic neuropathy, unspecified: Secondary | ICD-10-CM | POA: Diagnosis not present

## 2015-07-07 DIAGNOSIS — L600 Ingrowing nail: Secondary | ICD-10-CM | POA: Diagnosis not present

## 2015-07-07 DIAGNOSIS — E1151 Type 2 diabetes mellitus with diabetic peripheral angiopathy without gangrene: Secondary | ICD-10-CM | POA: Diagnosis not present

## 2015-07-07 DIAGNOSIS — L03031 Cellulitis of right toe: Secondary | ICD-10-CM | POA: Diagnosis not present

## 2015-07-10 ENCOUNTER — Ambulatory Visit (INDEPENDENT_AMBULATORY_CARE_PROVIDER_SITE_OTHER): Payer: Medicare Other | Admitting: *Deleted

## 2015-07-10 DIAGNOSIS — I428 Other cardiomyopathies: Secondary | ICD-10-CM

## 2015-07-10 DIAGNOSIS — I429 Cardiomyopathy, unspecified: Secondary | ICD-10-CM | POA: Diagnosis not present

## 2015-07-11 ENCOUNTER — Other Ambulatory Visit: Payer: Self-pay | Admitting: Cardiovascular Disease

## 2015-07-11 NOTE — Progress Notes (Signed)
Remote ICD transmission.   

## 2015-07-21 LAB — CUP PACEART REMOTE DEVICE CHECK
Brady Statistic RV Percent Paced: 1 %
HIGH POWER IMPEDANCE MEASURED VALUE: 79 Ohm
HighPow Impedance: 79 Ohm
Implantable Lead Implant Date: 20090414
Implantable Lead Location: 753860
Implantable Lead Model: 7122
Lead Channel Pacing Threshold Amplitude: 1 V
Lead Channel Pacing Threshold Pulse Width: 0.5 ms
Lead Channel Sensing Intrinsic Amplitude: 9 mV
Lead Channel Setting Pacing Pulse Width: 0.5 ms
MDC IDC MSMT BATTERY REMAINING LONGEVITY: 36 mo
MDC IDC MSMT BATTERY VOLTAGE: 2.57 V
MDC IDC MSMT LEADCHNL RV IMPEDANCE VALUE: 360 Ohm
MDC IDC SESS DTM: 20161201074749
MDC IDC SET LEADCHNL RV PACING AMPLITUDE: 2.5 V
MDC IDC SET LEADCHNL RV SENSING SENSITIVITY: 0.3 mV
Pulse Gen Serial Number: 536141

## 2015-07-22 ENCOUNTER — Encounter: Payer: Self-pay | Admitting: Cardiology

## 2015-07-29 ENCOUNTER — Other Ambulatory Visit: Payer: Self-pay | Admitting: Cardiovascular Disease

## 2015-08-05 ENCOUNTER — Other Ambulatory Visit (INDEPENDENT_AMBULATORY_CARE_PROVIDER_SITE_OTHER): Payer: Self-pay | Admitting: Internal Medicine

## 2015-08-12 DIAGNOSIS — I739 Peripheral vascular disease, unspecified: Secondary | ICD-10-CM | POA: Diagnosis not present

## 2015-08-14 ENCOUNTER — Other Ambulatory Visit: Payer: Self-pay | Admitting: Cardiovascular Disease

## 2015-08-18 ENCOUNTER — Ambulatory Visit: Payer: Medicare Other | Admitting: Cardiovascular Disease

## 2015-08-20 ENCOUNTER — Ambulatory Visit: Payer: Medicare Other | Admitting: Cardiovascular Disease

## 2015-08-25 DIAGNOSIS — Z23 Encounter for immunization: Secondary | ICD-10-CM | POA: Diagnosis not present

## 2015-08-25 DIAGNOSIS — E1140 Type 2 diabetes mellitus with diabetic neuropathy, unspecified: Secondary | ICD-10-CM | POA: Diagnosis not present

## 2015-08-25 DIAGNOSIS — E1142 Type 2 diabetes mellitus with diabetic polyneuropathy: Secondary | ICD-10-CM | POA: Diagnosis not present

## 2015-08-25 DIAGNOSIS — E6609 Other obesity due to excess calories: Secondary | ICD-10-CM | POA: Diagnosis not present

## 2015-08-25 DIAGNOSIS — G894 Chronic pain syndrome: Secondary | ICD-10-CM | POA: Diagnosis not present

## 2015-08-25 DIAGNOSIS — Z6831 Body mass index (BMI) 31.0-31.9, adult: Secondary | ICD-10-CM | POA: Diagnosis not present

## 2015-08-25 DIAGNOSIS — M545 Low back pain: Secondary | ICD-10-CM | POA: Diagnosis not present

## 2015-08-25 DIAGNOSIS — Z1389 Encounter for screening for other disorder: Secondary | ICD-10-CM | POA: Diagnosis not present

## 2015-08-25 DIAGNOSIS — R252 Cramp and spasm: Secondary | ICD-10-CM | POA: Diagnosis not present

## 2015-08-25 DIAGNOSIS — I5022 Chronic systolic (congestive) heart failure: Secondary | ICD-10-CM | POA: Diagnosis not present

## 2015-09-03 ENCOUNTER — Other Ambulatory Visit: Payer: Self-pay | Admitting: Cardiovascular Disease

## 2015-09-12 ENCOUNTER — Encounter: Payer: Self-pay | Admitting: Cardiovascular Disease

## 2015-09-12 ENCOUNTER — Ambulatory Visit (INDEPENDENT_AMBULATORY_CARE_PROVIDER_SITE_OTHER): Payer: Medicare Other | Admitting: Cardiovascular Disease

## 2015-09-12 VITALS — BP 118/80 | HR 94 | Ht 73.0 in | Wt 232.0 lb

## 2015-09-12 DIAGNOSIS — Z95810 Presence of automatic (implantable) cardiac defibrillator: Secondary | ICD-10-CM | POA: Diagnosis not present

## 2015-09-12 DIAGNOSIS — I456 Pre-excitation syndrome: Secondary | ICD-10-CM

## 2015-09-12 DIAGNOSIS — R079 Chest pain, unspecified: Secondary | ICD-10-CM

## 2015-09-12 DIAGNOSIS — I429 Cardiomyopathy, unspecified: Secondary | ICD-10-CM

## 2015-09-12 DIAGNOSIS — I10 Essential (primary) hypertension: Secondary | ICD-10-CM

## 2015-09-12 DIAGNOSIS — I428 Other cardiomyopathies: Secondary | ICD-10-CM

## 2015-09-12 MED ORDER — CARVEDILOL 25 MG PO TABS: ORAL_TABLET | ORAL | Status: DC

## 2015-09-12 MED ORDER — ISOSORBIDE MONONITRATE ER 30 MG PO TB24: 30.0000 mg | ORAL_TABLET | Freq: Every day | ORAL | Status: DC

## 2015-09-12 MED ORDER — ISOSORBIDE MONONITRATE ER 60 MG PO TB24: 60.0000 mg | ORAL_TABLET | Freq: Every day | ORAL | Status: DC

## 2015-09-12 NOTE — Progress Notes (Signed)
Patient ID: Mike Floyd, male   DOB: 1961-06-23, 55 y.o.   MRN: [id number]      SUBJECTIVE: The patient returns for routine follow up. In summary, he has a history of a nonischemic cardiomyopathy although left ventricular systolic function has since improved with normal EF in November 2014, 50-55%. He has an AICD and is followed by EP. He also has a history of WPW and is followed by Dr. Lovena Le (EP), and underwent radiofrequency ablation in 1999 at [facility].  Of note, he had normal coronary arteries by cardiac catheterization in 2003 and 2009. His most recent [place] nuclear stress test showed a possible small area of scar with unclear reversibility, it was deemed an overall low risk study.  He also has a history of erosive esophagitis and has seen GI.  He has been having left-sided chest pains for the past month , primarily occurring at rest, and radiating down his left arm. Denies leg swelling and worsening of chronic exertional dyspnea. Also denies orthopnea and paroxysmal nocturnal dyspnea.   Review of Systems: As per "subjective", otherwise negative.  No Known Allergies  Current Outpatient Prescriptions  Medication Sig Dispense Refill  . allopurinol (ZYLOPRIM) 300 MG tablet Take 300 mg by mouth daily.     Marland Kitchen amitriptyline (ELAVIL) 50 MG tablet Take 1 tablet by mouth at bedtime.    Marland Kitchen aspirin 81 MG tablet Take 81 mg by mouth daily.    Marland Kitchen atorvastatin (LIPITOR) 20 MG tablet Take 20 mg by mouth daily.    . carvedilol (COREG) 25 MG tablet TAKE 2 TABLETS BY MOUTH TWICE DAILY. 120 tablet 0  . clopidogrel (PLAVIX) 75 MG tablet Take 75 mg by mouth daily.    . colchicine 0.6 MG tablet Take 0.6 mg by mouth 2 (two) times daily.    . digoxin (LANOXIN) 0.125 MG tablet Take 0.125 mg by mouth daily.    . furosemide (LASIX) 20 MG tablet TAKE ONE TABLET BY MOUTH ONCE DAILY. 90 tablet 3  . gabapentin (NEURONTIN) 100 MG capsule Take 100 mg by mouth 2 (two) times daily. 2 tabs twice daily    .  glimepiride (AMARYL) 4 MG tablet Take 4 mg by mouth daily before breakfast.    . isosorbide mononitrate (IMDUR) 30 MG 24 hr tablet TAKE ONE TABLET BY MOUTH ONCE DAILY. 30 tablet 0  . L-Methylfolate-B6-B12 (FOLTANX) 3-35-2 MG TABS Take 1 tablet by mouth 2 (two) times daily.    Marland Kitchen lisinopril (PRINIVIL,ZESTRIL) 5 MG tablet TAKE ONE TABLET BY MOUTH ONCE DAILY. 30 tablet 0  . LORazepam (ATIVAN) 1 MG tablet Take 1 mg by mouth 3 (three) times daily.     . metoCLOPramide (REGLAN) 10 MG tablet TAKE 1 TABLET BY MOUTH 30 MINUTES BEFORE BREAKFAST AND EVENING MEAL DAILY. 60 tablet 2  . omeprazole (PRILOSEC) 40 MG capsule TAKE (1) CAPSULE BY MOUTH ONCE DAILY. 30 capsule 6  . oxycodone (ROXICODONE) 30 MG immediate release tablet Take 1-2 tablets by mouth 4 (four) times daily as needed.     . sucralfate (CARAFATE) 1 g tablet TAKE 1 TABLET BY MOUTH 30 MINTUES BEFORE MEALS AND AT BEDTIME. 120 tablet 0   No current facility-administered medications for this visit.    Past Medical History  Diagnosis Date  . Diabetes mellitus DX: 2010  . Gout   . Hypertension   . CKD (chronic kidney disease), stage II   . Erosive esophagitis     a. per EGD (08/2011), Dr. Laural Golden - Erosive reflux  esophagitis improved but not completely healed since previous EGD 3 years ago. Bx showing  ulcerated gatroesophageal junction mucosa. negative for H. pylori  . Nonischemic dilated cardiomyopathy ([place])     a. H/O EF as low as 35-40% by LV gram 10/2007;  b. Echo 02/2011 EF 50-55%, inf HK, Gr 1 DD   . DDD (degenerative disc disease), lumbar   . Hearing deficit     a. wear bilateral hearing aides  . AICD (automatic cardioverter/defibrillator) present 11/2007    a. 11/2007 [facility] Current VR - single lead ICD  . Chest pain     a. 10/2007 Cath:  normal Cors.  . WPW (Wolff-Parkinson-White syndrome)     a. s/p RFCA @ [place]  . TIA (transient ischemic attack)     July, 2013  . Tobacco abuse, in remission 06/27/2009    Discontinued in 2009     . Stroke ([place])     mini-stroke in 2014  . Myocardial infarction ([place]) 2011  . Sleep apnea     pt doesnt use, states"I cant afford one". PCP aware  . GERD (gastroesophageal reflux disease)   . Anxiety     Past Surgical History  Procedure Laterality Date  . Back surgery      1995  . Appendectomy    . Cholecystectomy    . Radiofrequency ablation  1999    WPW; performed at [facility]  . Elbow surgery Left 06/2010  . Cholecystectomy, laparoscopic      11/2007  . Esophagogastroduodenoscopy  03/31/2012    also 08/2011; [name]  . Colonoscopy w/ polypectomy  2009  . Cardiac defibrillator placement    . Esophagogastroduodenoscopy (egd) with propofol N/A 11/08/2014    Procedure: ESOPHAGOGASTRODUODENOSCOPY (EGD) WITH PROPOFOL;  Surgeon: Rogene Houston, MD;  Location: [facility];  Service: Endoscopy;  Laterality: N/A;  Hiatus is 39 , GE Junction is 37  . Esophageal dilation N/A 11/08/2014    Procedure: ESOPHAGEAL DILATION;  Surgeon: Rogene Houston, MD;  Location: [facility];  Service: Endoscopy;  Laterality: N/A;  #56,     Social History   Social History  . Marital Status: Married    Spouse Name: N/A  . Number of Children: N/A  . Years of Education: N/A   Occupational History  . Worked for [employer] off in 11/11   Social History Main Topics  . Smoking status: Former Smoker -- 1.50 packs/day for 35 years    Types: Cigarettes    Start date: 06/30/1971    Quit date: 07/30/2010  . Smokeless tobacco: Former Systems developer    Types: Snuff    Quit date: 03/26/1994     Comment: currently uses a vape 11-06-2014.  Marland Kitchen Alcohol Use: No     Comment: used "years ago"  . Drug Use: No  . Sexual Activity: No   Other Topics Concern  . Not on file   Social History Narrative     Filed Vitals:   09/12/15 0839  BP: 118/80  Pulse: 94  Height: 6\' 1"  (1.854 m)  Weight: 232 lb (105.235 kg)  SpO2: 97%    PHYSICAL EXAM General: NAD HEENT: Normal. Neck: No JVD, no thyromegaly. Lungs: Clear to  auscultation bilaterally with normal respiratory effort. CV: Nondisplaced PMI.  Regular rate and rhythm, normal S1/S2, no S3/S4, no murmur. No pretibial or periankle edema.    Abdomen: Soft, obese.  Neurologic: Alert and oriented.  Psych: Normal affect. Skin: Normal. Musculoskeletal: No gross deformities.  ECG: Most recent ECG reviewed.      ASSESSMENT AND PLAN: 1. NICM: Normalized LVEF. Has stable dyspnea with exertion. He appears euvolemic today. Continue current dose of Coreg and ACEI.   2. Chest pain: Atypical symptoms for cardiac chest pain, improved with Imdur at last visit but now complains again. Overall low risk MPI 06/2013, question of possible small scar, unclear if any reversibility. Increase Imdur to 60 mg daily. Given the previous low risk nuclear MPI and normal coronary angiograms, no further testing is warranted.   3. WPW: Seldom has palpitations, with history of prior ablation. Continue regular follow up with EP.   4. ICD: Normal device function in 07/2015.  Dispo: Follow up in 1 year with NP.   Kate Sable, M.D., F.A.C.C.

## 2015-09-12 NOTE — Patient Instructions (Signed)
Your physician wants you to follow-up in: 1 year with Mike Sims NP You will receive a reminder letter in the mail two months in advance. If you don't receive a letter, please call our office to schedule the follow-up appointment.  INCREASE Imdur to 60 mg daily    If you need a refill on your cardiac medications before your next appointment, please call your pharmacy.     Thank you for choosing [facility] !

## 2015-10-04 ENCOUNTER — Other Ambulatory Visit: Payer: Self-pay | Admitting: Cardiovascular Disease

## 2015-10-07 ENCOUNTER — Other Ambulatory Visit (INDEPENDENT_AMBULATORY_CARE_PROVIDER_SITE_OTHER): Payer: Self-pay | Admitting: Internal Medicine

## 2015-10-09 ENCOUNTER — Ambulatory Visit (INDEPENDENT_AMBULATORY_CARE_PROVIDER_SITE_OTHER): Payer: Medicare Other | Admitting: *Deleted

## 2015-10-09 DIAGNOSIS — E669 Obesity, unspecified: Secondary | ICD-10-CM | POA: Diagnosis not present

## 2015-10-09 DIAGNOSIS — Z6830 Body mass index (BMI) 30.0-30.9, adult: Secondary | ICD-10-CM | POA: Diagnosis not present

## 2015-10-09 DIAGNOSIS — Z1389 Encounter for screening for other disorder: Secondary | ICD-10-CM | POA: Diagnosis not present

## 2015-10-09 DIAGNOSIS — I429 Cardiomyopathy, unspecified: Secondary | ICD-10-CM | POA: Diagnosis not present

## 2015-10-09 DIAGNOSIS — E1165 Type 2 diabetes mellitus with hyperglycemia: Secondary | ICD-10-CM | POA: Diagnosis not present

## 2015-10-09 DIAGNOSIS — I428 Other cardiomyopathies: Secondary | ICD-10-CM

## 2015-10-09 DIAGNOSIS — Z713 Dietary counseling and surveillance: Secondary | ICD-10-CM | POA: Diagnosis not present

## 2015-10-09 NOTE — Progress Notes (Signed)
Remote ICD transmission.   

## 2015-10-18 LAB — CUP PACEART REMOTE DEVICE CHECK
Battery Remaining Longevity: 36 mo
HighPow Impedance: 77 Ohm
HighPow Impedance: 77 Ohm
Implantable Lead Implant Date: 20090414
Implantable Lead Model: 7122
Lead Channel Pacing Threshold Amplitude: 1 V
Lead Channel Sensing Intrinsic Amplitude: 8.6 mV
Lead Channel Setting Pacing Amplitude: 2.5 V
Lead Channel Setting Pacing Pulse Width: 0.5 ms
Lead Channel Setting Sensing Sensitivity: 0.3 mV
MDC IDC LEAD LOCATION: 753860
MDC IDC MSMT BATTERY VOLTAGE: 2.57 V
MDC IDC MSMT LEADCHNL RV IMPEDANCE VALUE: 350 Ohm
MDC IDC MSMT LEADCHNL RV PACING THRESHOLD PULSEWIDTH: 0.5 ms
MDC IDC PG SERIAL: 536141
MDC IDC SESS DTM: 20170302081859
MDC IDC STAT BRADY RV PERCENT PACED: 1 %

## 2015-10-18 NOTE — Progress Notes (Signed)
Normal remote reviewed.  Next Merlin 01/08/16 

## 2015-10-22 ENCOUNTER — Encounter: Payer: Self-pay | Admitting: Cardiology

## 2015-10-30 DIAGNOSIS — I10 Essential (primary) hypertension: Secondary | ICD-10-CM | POA: Diagnosis not present

## 2015-10-30 DIAGNOSIS — Z1389 Encounter for screening for other disorder: Secondary | ICD-10-CM | POA: Diagnosis not present

## 2015-10-30 DIAGNOSIS — M7712 Lateral epicondylitis, left elbow: Secondary | ICD-10-CM | POA: Diagnosis not present

## 2015-10-30 DIAGNOSIS — M62830 Muscle spasm of back: Secondary | ICD-10-CM | POA: Diagnosis not present

## 2015-10-30 DIAGNOSIS — Z6830 Body mass index (BMI) 30.0-30.9, adult: Secondary | ICD-10-CM | POA: Diagnosis not present

## 2015-10-30 DIAGNOSIS — M1991 Primary osteoarthritis, unspecified site: Secondary | ICD-10-CM | POA: Diagnosis not present

## 2015-11-18 DIAGNOSIS — Z1389 Encounter for screening for other disorder: Secondary | ICD-10-CM | POA: Diagnosis not present

## 2015-11-18 DIAGNOSIS — Z125 Encounter for screening for malignant neoplasm of prostate: Secondary | ICD-10-CM | POA: Diagnosis not present

## 2015-11-18 DIAGNOSIS — N189 Chronic kidney disease, unspecified: Secondary | ICD-10-CM | POA: Diagnosis not present

## 2015-11-18 DIAGNOSIS — E039 Hypothyroidism, unspecified: Secondary | ICD-10-CM | POA: Diagnosis not present

## 2015-12-05 ENCOUNTER — Other Ambulatory Visit (INDEPENDENT_AMBULATORY_CARE_PROVIDER_SITE_OTHER): Payer: Self-pay | Admitting: Internal Medicine

## 2015-12-25 DIAGNOSIS — Z1389 Encounter for screening for other disorder: Secondary | ICD-10-CM | POA: Diagnosis not present

## 2015-12-25 DIAGNOSIS — Z6831 Body mass index (BMI) 31.0-31.9, adult: Secondary | ICD-10-CM | POA: Diagnosis not present

## 2015-12-25 DIAGNOSIS — R319 Hematuria, unspecified: Secondary | ICD-10-CM | POA: Diagnosis not present

## 2015-12-25 DIAGNOSIS — I5022 Chronic systolic (congestive) heart failure: Secondary | ICD-10-CM | POA: Diagnosis not present

## 2015-12-25 DIAGNOSIS — R079 Chest pain, unspecified: Secondary | ICD-10-CM | POA: Diagnosis not present

## 2015-12-25 DIAGNOSIS — L84 Corns and callosities: Secondary | ICD-10-CM | POA: Diagnosis not present

## 2015-12-26 ENCOUNTER — Ambulatory Visit (HOSPITAL_COMMUNITY)
Admission: RE | Admit: 2015-12-26 | Discharge: 2015-12-26 | Disposition: A | Discharge status: Home / Self Care | Payer: Medicare Other | Source: Ambulatory Visit | Attending: Internal Medicine | Admitting: Internal Medicine

## 2015-12-26 ENCOUNTER — Other Ambulatory Visit (HOSPITAL_COMMUNITY): Payer: Self-pay | Admitting: Internal Medicine

## 2015-12-26 DIAGNOSIS — M545 Low back pain: Secondary | ICD-10-CM | POA: Diagnosis not present

## 2015-12-26 DIAGNOSIS — N200 Calculus of kidney: Secondary | ICD-10-CM | POA: Diagnosis not present

## 2015-12-26 DIAGNOSIS — R319 Hematuria, unspecified: Secondary | ICD-10-CM

## 2015-12-26 DIAGNOSIS — R0602 Shortness of breath: Secondary | ICD-10-CM | POA: Diagnosis not present

## 2015-12-26 DIAGNOSIS — M549 Dorsalgia, unspecified: Secondary | ICD-10-CM

## 2015-12-26 DIAGNOSIS — R079 Chest pain, unspecified: Secondary | ICD-10-CM | POA: Diagnosis not present

## 2015-12-26 DIAGNOSIS — N281 Cyst of kidney, acquired: Secondary | ICD-10-CM | POA: Insufficient documentation

## 2016-01-08 ENCOUNTER — Ambulatory Visit (INDEPENDENT_AMBULATORY_CARE_PROVIDER_SITE_OTHER): Payer: Medicare Other | Admitting: *Deleted

## 2016-01-08 DIAGNOSIS — I428 Other cardiomyopathies: Secondary | ICD-10-CM

## 2016-01-08 DIAGNOSIS — Z95810 Presence of automatic (implantable) cardiac defibrillator: Secondary | ICD-10-CM | POA: Diagnosis not present

## 2016-01-08 DIAGNOSIS — I429 Cardiomyopathy, unspecified: Secondary | ICD-10-CM | POA: Diagnosis not present

## 2016-01-09 NOTE — Progress Notes (Signed)
Remote ICD transmission.   

## 2016-01-13 DIAGNOSIS — M545 Low back pain: Secondary | ICD-10-CM | POA: Diagnosis not present

## 2016-01-14 ENCOUNTER — Other Ambulatory Visit: Payer: Self-pay | Admitting: Orthopedic Surgery

## 2016-01-14 DIAGNOSIS — M545 Low back pain, unspecified: Secondary | ICD-10-CM

## 2016-01-19 ENCOUNTER — Other Ambulatory Visit (INDEPENDENT_AMBULATORY_CARE_PROVIDER_SITE_OTHER): Payer: Self-pay | Admitting: Internal Medicine

## 2016-01-23 ENCOUNTER — Ambulatory Visit
Admission: RE | Admit: 2016-01-23 | Discharge: 2016-01-23 | Disposition: A | Discharge status: Home / Self Care | Payer: Medicare Other | Source: Ambulatory Visit | Attending: Orthopedic Surgery | Admitting: Orthopedic Surgery

## 2016-01-23 DIAGNOSIS — M545 Low back pain, unspecified: Secondary | ICD-10-CM

## 2016-01-23 DIAGNOSIS — M5126 Other intervertebral disc displacement, lumbar region: Secondary | ICD-10-CM | POA: Diagnosis not present

## 2016-01-23 LAB — CUP PACEART REMOTE DEVICE CHECK
Battery Remaining Longevity: 18 mo
Battery Voltage: 2.54 V
Brady Statistic RV Percent Paced: 1 %
Date Time Interrogation Session: 20170601064836
HighPow Impedance: 79 Ohm
HighPow Impedance: 79 Ohm
Implantable Lead Implant Date: 20090414
Implantable Lead Location: 753860
Implantable Lead Model: 7122
Lead Channel Impedance Value: 360 Ohm
Lead Channel Pacing Threshold Amplitude: 1 V
Lead Channel Pacing Threshold Pulse Width: 0.5 ms
Lead Channel Sensing Intrinsic Amplitude: 8.6 mV
Lead Channel Setting Pacing Amplitude: 2.5 V
Lead Channel Setting Pacing Pulse Width: 0.5 ms
Lead Channel Setting Sensing Sensitivity: 0.3 mV
Pulse Gen Serial Number: 536141

## 2016-01-23 MED ORDER — IOPAMIDOL (ISOVUE-M 200) INJECTION 41%
15.0000 mL | Freq: Once | INTRAMUSCULAR | Status: AC
  Administered 2016-01-23: 15 mL via INTRATHECAL

## 2016-01-23 MED ORDER — MEPERIDINE HCL 100 MG/ML IJ SOLN
75.0000 mg | Freq: Once | INTRAMUSCULAR | Status: AC
  Administered 2016-01-23: 75 mg via INTRAMUSCULAR

## 2016-01-23 MED ORDER — ONDANSETRON HCL 4 MG/2ML IJ SOLN
4.0000 mg | Freq: Once | INTRAMUSCULAR | Status: AC
  Administered 2016-01-23: 4 mg via INTRAMUSCULAR

## 2016-01-23 NOTE — Progress Notes (Addendum)
Patient states he has been off Plavix for at least the past five days.  He states he has been off Elavil and Reglan for at least the past two days.  No valium given pre-myelogram secondary to low blood pressure.  jkl

## 2016-01-23 NOTE — Discharge Instructions (Signed)
Myelogram Discharge Instructions  1. Go home and rest quietly for the next 24 hours.  It is important to lie flat for the next 24 hours.  Get up only to go to the restroom.  You may lie in the bed or on a couch on your back, your stomach, your left side or your right side.  You may have one pillow under your head.  You may have pillows between your knees while you are on your side or under your knees while you are on your back.  2. DO NOT drive today.  Recline the seat as far back as it will go, while still wearing your seat belt, on the way home.  3. You may get up to go to the bathroom as needed.  You may sit up for 10 minutes to eat.  You may resume your normal diet and medications unless otherwise indicated.  Drink lots of extra fluids today and tomorrow.  4. The incidence of headache, nausea, or vomiting is about 5% (one in 20 patients).  If you develop a headache, lie flat and drink plenty of fluids until the headache goes away.  Caffeinated beverages may be helpful.  If you develop severe nausea and vomiting or a headache that does not go away with flat bed rest, call [phone].  5. You may resume normal activities after your 24 hours of bed rest is over; however, do not exert yourself strongly or do any heavy lifting tomorrow. If when you get up you have a headache when standing, go back to bed and force fluids for another 24 hours.  6. Call your physician for a follow-up appointment.  The results of your myelogram will be sent directly to your physician by the following day.  7. If you have any questions or if complications develop after you arrive home, please call [phone].  Discharge instructions have been explained to the patient.  The patient, or the person responsible for the patient, fully understands these instructions.       MAY RESUME PLAVIX TO DAY.   May resume Elavil and Reglan on January 24, 2016, after 9:30 am.

## 2016-01-28 ENCOUNTER — Encounter: Payer: Self-pay | Admitting: Cardiology

## 2016-01-29 DIAGNOSIS — M4806 Spinal stenosis, lumbar region: Secondary | ICD-10-CM | POA: Diagnosis not present

## 2016-02-11 DIAGNOSIS — M4806 Spinal stenosis, lumbar region: Secondary | ICD-10-CM | POA: Diagnosis not present

## 2016-03-01 DIAGNOSIS — M545 Low back pain: Secondary | ICD-10-CM | POA: Diagnosis not present

## 2016-03-01 DIAGNOSIS — M4806 Spinal stenosis, lumbar region: Secondary | ICD-10-CM | POA: Diagnosis not present

## 2016-03-02 ENCOUNTER — Other Ambulatory Visit: Payer: Self-pay | Admitting: Cardiovascular Disease

## 2016-03-02 DIAGNOSIS — I9589 Other hypotension: Secondary | ICD-10-CM | POA: Diagnosis not present

## 2016-03-02 DIAGNOSIS — I420 Dilated cardiomyopathy: Secondary | ICD-10-CM | POA: Diagnosis not present

## 2016-03-02 DIAGNOSIS — E6609 Other obesity due to excess calories: Secondary | ICD-10-CM | POA: Diagnosis not present

## 2016-03-02 DIAGNOSIS — E119 Type 2 diabetes mellitus without complications: Secondary | ICD-10-CM | POA: Diagnosis not present

## 2016-03-02 DIAGNOSIS — Z6830 Body mass index (BMI) 30.0-30.9, adult: Secondary | ICD-10-CM | POA: Diagnosis not present

## 2016-03-02 DIAGNOSIS — Z1389 Encounter for screening for other disorder: Secondary | ICD-10-CM | POA: Diagnosis not present

## 2016-03-02 DIAGNOSIS — E1140 Type 2 diabetes mellitus with diabetic neuropathy, unspecified: Secondary | ICD-10-CM | POA: Diagnosis not present

## 2016-03-02 DIAGNOSIS — Z0000 Encounter for general adult medical examination without abnormal findings: Secondary | ICD-10-CM | POA: Diagnosis not present

## 2016-03-02 DIAGNOSIS — I10 Essential (primary) hypertension: Secondary | ICD-10-CM | POA: Diagnosis not present

## 2016-03-02 DIAGNOSIS — G894 Chronic pain syndrome: Secondary | ICD-10-CM | POA: Diagnosis not present

## 2016-03-05 ENCOUNTER — Other Ambulatory Visit (INDEPENDENT_AMBULATORY_CARE_PROVIDER_SITE_OTHER): Payer: Self-pay | Admitting: Internal Medicine

## 2016-03-23 DIAGNOSIS — M545 Low back pain: Secondary | ICD-10-CM | POA: Diagnosis not present

## 2016-03-23 DIAGNOSIS — M4806 Spinal stenosis, lumbar region: Secondary | ICD-10-CM | POA: Diagnosis not present

## 2016-03-31 DIAGNOSIS — L600 Ingrowing nail: Secondary | ICD-10-CM | POA: Diagnosis not present

## 2016-03-31 DIAGNOSIS — E1151 Type 2 diabetes mellitus with diabetic peripheral angiopathy without gangrene: Secondary | ICD-10-CM | POA: Diagnosis not present

## 2016-03-31 DIAGNOSIS — E1140 Type 2 diabetes mellitus with diabetic neuropathy, unspecified: Secondary | ICD-10-CM | POA: Diagnosis not present

## 2016-03-31 DIAGNOSIS — L03031 Cellulitis of right toe: Secondary | ICD-10-CM | POA: Diagnosis not present

## 2016-04-21 DIAGNOSIS — E1140 Type 2 diabetes mellitus with diabetic neuropathy, unspecified: Secondary | ICD-10-CM | POA: Diagnosis not present

## 2016-04-21 DIAGNOSIS — L89893 Pressure ulcer of other site, stage 3: Secondary | ICD-10-CM | POA: Diagnosis not present

## 2016-04-21 DIAGNOSIS — E1151 Type 2 diabetes mellitus with diabetic peripheral angiopathy without gangrene: Secondary | ICD-10-CM | POA: Diagnosis not present

## 2016-05-12 ENCOUNTER — Other Ambulatory Visit: Payer: Self-pay | Admitting: Cardiovascular Disease

## 2016-05-27 DIAGNOSIS — I43 Cardiomyopathy in diseases classified elsewhere: Secondary | ICD-10-CM | POA: Diagnosis not present

## 2016-05-27 DIAGNOSIS — Z6830 Body mass index (BMI) 30.0-30.9, adult: Secondary | ICD-10-CM | POA: Diagnosis not present

## 2016-05-27 DIAGNOSIS — G894 Chronic pain syndrome: Secondary | ICD-10-CM | POA: Diagnosis not present

## 2016-05-27 DIAGNOSIS — E1129 Type 2 diabetes mellitus with other diabetic kidney complication: Secondary | ICD-10-CM | POA: Diagnosis not present

## 2016-05-27 DIAGNOSIS — Z1389 Encounter for screening for other disorder: Secondary | ICD-10-CM | POA: Diagnosis not present

## 2016-06-02 DIAGNOSIS — L600 Ingrowing nail: Secondary | ICD-10-CM | POA: Diagnosis not present

## 2016-06-02 DIAGNOSIS — E1140 Type 2 diabetes mellitus with diabetic neuropathy, unspecified: Secondary | ICD-10-CM | POA: Diagnosis not present

## 2016-06-02 DIAGNOSIS — L03031 Cellulitis of right toe: Secondary | ICD-10-CM | POA: Diagnosis not present

## 2016-06-02 DIAGNOSIS — E1151 Type 2 diabetes mellitus with diabetic peripheral angiopathy without gangrene: Secondary | ICD-10-CM | POA: Diagnosis not present

## 2016-06-08 ENCOUNTER — Other Ambulatory Visit (INDEPENDENT_AMBULATORY_CARE_PROVIDER_SITE_OTHER): Payer: Self-pay | Admitting: Internal Medicine

## 2016-07-09 ENCOUNTER — Telehealth: Payer: Self-pay | Admitting: Cardiology

## 2016-07-09 NOTE — Telephone Encounter (Signed)
LMOVM for pt to return call 

## 2016-07-12 NOTE — Telephone Encounter (Signed)
Spoke w/ pt and informed him that his device has reached ERI. Informed him a scheduler will call to schedule an appt /w SK / RU / AS. Pt verbalized understanding and had not further questions at this time.

## 2016-07-29 ENCOUNTER — Ambulatory Visit (INDEPENDENT_AMBULATORY_CARE_PROVIDER_SITE_OTHER): Payer: Medicare Other | Admitting: Internal Medicine

## 2016-07-29 ENCOUNTER — Encounter (INDEPENDENT_AMBULATORY_CARE_PROVIDER_SITE_OTHER): Payer: Self-pay

## 2016-07-29 ENCOUNTER — Encounter: Payer: Self-pay | Admitting: Internal Medicine

## 2016-07-29 VITALS — BP 82/62 | HR 84 | Ht 73.0 in | Wt 230.6 lb

## 2016-07-29 DIAGNOSIS — I456 Pre-excitation syndrome: Secondary | ICD-10-CM | POA: Diagnosis not present

## 2016-07-29 DIAGNOSIS — Z95810 Presence of automatic (implantable) cardiac defibrillator: Secondary | ICD-10-CM | POA: Diagnosis not present

## 2016-07-29 DIAGNOSIS — G458 Other transient cerebral ischemic attacks and related syndromes: Secondary | ICD-10-CM | POA: Diagnosis not present

## 2016-07-29 DIAGNOSIS — I428 Other cardiomyopathies: Secondary | ICD-10-CM

## 2016-07-29 DIAGNOSIS — R0609 Other forms of dyspnea: Secondary | ICD-10-CM

## 2016-07-29 LAB — CUP PACEART INCLINIC DEVICE CHECK
Battery Voltage: 2.44 V
Date Time Interrogation Session: 20171221153818
HIGH POWER IMPEDANCE MEASURED VALUE: 84 Ohm
Implantable Lead Location: 753860
Implantable Pulse Generator Implant Date: 20090414
Lead Channel Impedance Value: 740 Ohm
Lead Channel Pacing Threshold Pulse Width: 0.5 ms
MDC IDC LEAD IMPLANT DT: 20090414
MDC IDC LEAD MODEL: 7122
MDC IDC MSMT LEADCHNL RV PACING THRESHOLD AMPLITUDE: 1.5 V
MDC IDC MSMT LEADCHNL RV SENSING INTR AMPL: 9.2 mV
MDC IDC SET LEADCHNL RV PACING AMPLITUDE: 2.5 V
MDC IDC SET LEADCHNL RV PACING PULSEWIDTH: 0.5 ms
MDC IDC SET LEADCHNL RV SENSING SENSITIVITY: 0.3 mV
MDC IDC STAT BRADY RV PERCENT PACED: 0 %
Pulse Gen Serial Number: 536141

## 2016-07-29 NOTE — Progress Notes (Signed)
Patient Care Team: Redmond School, MD as PCP - General (Internal Medicine) Redmond School, MD (Internal Medicine) Evans Lance, MD (Cardiology) Yehuda Savannah, MD as Attending Physician (Cardiology)   HPI  Mike Floyd is a 55 y.o. male Seen after a hiatus of many many years. He underwent ICD implantation 4/09 for primary prevention. He has been followed in [place] he has reached ERI Catheterization 2009 demonstrated no obstructive coronary disease ejection fraction of 35-40% Myoview 2014 demonstrated interval normalization of left ventricular function   Notably, he is on aspirin and Plavix. Review of the discharge summary 8/13 identifies that the former was supposed to be discontinued.  He has complaints of progressive shortness of breath over the last few years.  Records dating back to 2014 were reviewed. He has a history of WPW status post ablation in 1999  Past Medical History:  Diagnosis Date  . AICD (automatic cardioverter/defibrillator) present 11/2007   a. 11/2007 [facility] Current VR - single lead ICD  . Anxiety   . Chest pain    a. 10/2007 Cath:  normal Cors.  . CKD (chronic kidney disease), stage II   . DDD (degenerative disc disease), lumbar   . Diabetes mellitus DX: 2010  . Erosive esophagitis    a. per EGD (08/2011), Dr. Laural Golden - Erosive reflux esophagitis improved but not completely healed since previous EGD 3 years ago. Bx showing  ulcerated gatroesophageal junction mucosa. negative for H. pylori  . GERD (gastroesophageal reflux disease)   . Gout   . Hearing deficit    a. wear bilateral hearing aides  . Hypertension   . Myocardial infarction 2011  . Nonischemic dilated cardiomyopathy ([place])    a. H/O EF as low as 35-40% by LV gram 10/2007;  b. Echo 02/2011 EF 50-55%, inf HK, Gr 1 DD   . Sleep apnea    pt doesnt use, states"I cant afford one". PCP aware  . Stroke ([place])    mini-stroke in 2014  . TIA (transient ischemic attack)    July, 2013  .  Tobacco abuse, in remission 06/27/2009   Discontinued in 2009    . WPW (Wolff-Parkinson-White syndrome)    a. s/p RFCA @ [place]    Past Surgical History:  Procedure Laterality Date  . APPENDECTOMY    . BACK SURGERY     1995  . CARDIAC DEFIBRILLATOR PLACEMENT    . CHOLECYSTECTOMY    . CHOLECYSTECTOMY, LAPAROSCOPIC     11/2007  . COLONOSCOPY W/ POLYPECTOMY  2009  . ELBOW SURGERY Left 06/2010  . ESOPHAGEAL DILATION N/A 11/08/2014   Procedure: ESOPHAGEAL DILATION;  Surgeon: Rogene Houston, MD;  Location: [facility];  Service: Endoscopy;  Laterality: N/A;  #56,   . ESOPHAGOGASTRODUODENOSCOPY  03/31/2012   also 08/2011; [name]  . ESOPHAGOGASTRODUODENOSCOPY (EGD) WITH PROPOFOL N/A 11/08/2014   Procedure: ESOPHAGOGASTRODUODENOSCOPY (EGD) WITH PROPOFOL;  Surgeon: Rogene Houston, MD;  Location: [facility];  Service: Endoscopy;  Laterality: N/A;  Hiatus is 39 , GE Junction is 37  . [place]; performed at [facility]    Current Outpatient Prescriptions  Medication Sig Dispense Refill  . amitriptyline (ELAVIL) 50 MG tablet Take 1 tablet by mouth at bedtime.    Marland Kitchen aspirin 81 MG tablet Take 81 mg by mouth daily.    Marland Kitchen atorvastatin (LIPITOR) 20 MG tablet Take 20 mg by mouth daily.    . carvedilol (COREG) 25 MG tablet TAKE 2  TABLETS BY MOUTH TWICE DAILY. 360 tablet 3  . clopidogrel (PLAVIX) 75 MG tablet Take 75 mg by mouth daily.    . colchicine 0.6 MG tablet Take 0.6 mg by mouth 2 (two) times daily.    . digoxin (LANOXIN) 0.125 MG tablet Take 0.125 mg by mouth daily.    . furosemide (LASIX) 20 MG tablet TAKE ONE TABLET BY MOUTH ONCE DAILY. 90 tablet 3  . gabapentin (NEURONTIN) 300 MG capsule Take 300 mg by mouth 3 (three) times daily. 2 tabs twice daily     . glimepiride (AMARYL) 4 MG tablet Take 4 mg by mouth daily before breakfast.    . isosorbide mononitrate (IMDUR) 60 MG 24 hr tablet Take 1 tablet (60 mg total) by mouth daily. 90 tablet 3  . L-METHYLFOLATE CALCIUM PO      . L-Methylfolate-B6-B12 (FOLTANX) 3-35-2 MG TABS Take 1 tablet by mouth 2 (two) times daily.    Marland Kitchen lisinopril (PRINIVIL,ZESTRIL) 5 MG tablet TAKE ONE TABLET BY MOUTH ONCE DAILY. 30 tablet 6  . LORazepam (ATIVAN) 1 MG tablet Take 1 mg by mouth 3 (three) times daily.     . metoCLOPramide (REGLAN) 10 MG tablet TAKE 1 TABLET BY MOUTH 30 MINUTES BEFORE BREAKFAST AND EVENING MEAL DAILY. 60 tablet 2  . omeprazole (PRILOSEC) 40 MG capsule TAKE (1) CAPSULE BY MOUTH ONCE DAILY. 30 capsule 6  . oxycodone (ROXICODONE) 30 MG immediate release tablet Take 1-2 tablets by mouth 4 (four) times daily as needed.     . sucralfate (CARAFATE) 1 g tablet TAKE 1 TABLET BY MOUTH 30 MINTUES BEFORE MEALS AND AT BEDTIME. 120 tablet 5   No current facility-administered medications for this visit.     No Known Allergies  Family History  Problem Relation Age of Onset  . Heart attack Mother 76  . Hypertension Mother   . Diabetes Mother   . Kidney disease Mother   . Breast cancer Mother   . Heart attack Father 55  . Hypertension Father   . Diabetes Father   . Stroke Brother   . Heart attack Brother 11  . Stroke Maternal Grandmother 80  . Diabetes Brother   . Hypertension Brother    Social History  Substance Use Topics  . Smoking status: Former Smoker    Packs/day: 1.50    Years: 35.00    Types: Cigarettes    Start date: 06/30/1971    Quit date: 07/30/2010  . Smokeless tobacco: Former Systems developer    Types: Snuff    Quit date: 03/26/1994     Comment: currently uses a vape 11-06-2014.  Marland Kitchen Alcohol use No     Comment: used "years ago"     Review of Systems negative except from HPI and PMH  Physical Exam BP (!) 82/62   Pulse 84   Ht 6\' 1"  (1.854 m)   Wt 230 lb 9.6 oz (104.6 kg)   SpO2 94%   BMI 30.42 kg/m  Well developed and well nourished in no acute distress HENT normal E scleral and icterus clear Neck Supple JVP flat; carotids brisk and full Clear to ausculation  Regular rate and rhythm, no murmurs  gallops or rub Soft with active bowel sounds No clubbing cyanosis  Edema Alert and oriented, grossly normal motor and sensory function Skin Warm and Dry  ECG demonstrates sinus rhythm at 80 Intervals 18/09/34  Assessment and  Plan  Nonischemic cardiomyopathy interval resolution 2013  TIA 2013  Hypotension  Dyspnea on exertion  Obstructive  sleep apnea   Mechanism of his dyspnea is not clear. It has been 4 years since his LV function was assessed. We will repeat that. In the event that it is unenlightening, I would consider a pulmonary evaluation.  We have reviewed the benefits and risks of generator replacement.  These include but are not limited to lead fracture and infection.  The patient understands, agrees and is willing to proceed.    Given his normalized LV function, we will discontinue his digoxin.  Given his hypotension the absence of coronary disease we'll discontinue nitrates.. The recommendation following his TIA was to use Plavix and discontinue aspirin. We will discontinue the aspirin at this time.  He had an abnormal sleep study many years ago. I will be in touch with his PCP to repeat this. Hopefully new technology with masks will make it more tolerable    Current medicines are reviewed at length with the patient today .  The patient does not  have concerns regarding medicines.

## 2016-07-29 NOTE — Patient Instructions (Addendum)
Medication Instructions: - Your physician has recommended you make the following change in your medication:  1) Stop aspirin 2) Stop digoxin 3) Stop isosorbide  Labwork: - pending procedure date  Procedures/Testing: - Your physician has requested that you have an echocardiogram. Echocardiography is a painless test that uses sound waves to create images of your heart. It provides your doctor with information about the size and shape of your heart and how well your heart's chambers and valves are working. This procedure takes approximately one hour. There are no restrictions for this procedure.  - Your physician has recommended that you have a defibrillator generator (battery) change. An implantable cardioverter defibrillator (ICD) is a small device that is placed in your chest or, in rare cases, your abdomen. This device uses electrical pulses or shocks to help control life-threatening, irregular heartbeats that could lead the heart to suddenly stop beating (sudden cardiac arrest). Leads are attached to the ICD that goes into your heart. This is done in the hospital and usually requires an overnight stay.  ** Dr. Olin Pia nurse, Nira Conn will call you schedule this for January/ February **  Follow-Up: - pending procedure date  Any Additional Special Instructions Will Be Listed Below (If Applicable). ** Dr. Caryl Comes has sent a message to your primary care doctor to be in touch with you regarding a sleep study **     If you need a refill on your cardiac medications before your next appointment, please call your pharmacy.

## 2016-08-05 ENCOUNTER — Ambulatory Visit (HOSPITAL_COMMUNITY)
Admission: RE | Admit: 2016-08-05 | Discharge: 2016-08-05 | Disposition: A | Discharge status: Home / Self Care | Payer: Medicare Other | Source: Ambulatory Visit | Attending: Internal Medicine | Admitting: Internal Medicine

## 2016-08-05 DIAGNOSIS — R0609 Other forms of dyspnea: Secondary | ICD-10-CM | POA: Insufficient documentation

## 2016-08-05 NOTE — Progress Notes (Signed)
*  PRELIMINARY RESULTS* Echocardiogram 2D Echocardiogram has been performed.  Mike Floyd 08/05/2016, 10:18 AM

## 2016-08-11 DIAGNOSIS — L600 Ingrowing nail: Secondary | ICD-10-CM | POA: Diagnosis not present

## 2016-08-11 DIAGNOSIS — E1151 Type 2 diabetes mellitus with diabetic peripheral angiopathy without gangrene: Secondary | ICD-10-CM | POA: Diagnosis not present

## 2016-08-11 DIAGNOSIS — L03031 Cellulitis of right toe: Secondary | ICD-10-CM | POA: Diagnosis not present

## 2016-08-11 DIAGNOSIS — E1140 Type 2 diabetes mellitus with diabetic neuropathy, unspecified: Secondary | ICD-10-CM | POA: Diagnosis not present

## 2016-08-13 ENCOUNTER — Telehealth: Payer: Self-pay | Admitting: Internal Medicine

## 2016-08-13 ENCOUNTER — Encounter: Payer: Self-pay | Admitting: *Deleted

## 2016-08-13 DIAGNOSIS — R0789 Other chest pain: Secondary | ICD-10-CM

## 2016-08-13 DIAGNOSIS — I428 Other cardiomyopathies: Secondary | ICD-10-CM

## 2016-08-13 DIAGNOSIS — Z01812 Encounter for preprocedural laboratory examination: Secondary | ICD-10-CM

## 2016-08-13 NOTE — Telephone Encounter (Signed)
I called and spoke with the patient to arrange his ICD gen change with Dr. Caryl Comes. He is scheduled for 09/10/16 at 7:30 am. He will come for labs on 09/03/16. Letter of instructions will be mailed to the patient.  He also advised that he has been having intermittent, but daily chest discomfort since imdur was stopped by Dr. Caryl Comes at his office visit on 07/29/16.   "Given his normalized LV function, we will discontinue his digoxin.  Given his hypotension the absence of coronary disease we'll discontinue nitrates.. The recommendation following his TIA was to use Plavix and discontinue aspirin. We will discontinue the aspirin at this time."  The patient states that his BP has been 101-102/62. I have advised the patient that if his chest discomfort persists, then he can try taking imdur 30 mg 1/2 tablet at night to see if this helps his symptoms. He has been advised to monitor his BP.  Will forward to Dr. Caryl Comes for any further recommendations.

## 2016-08-17 NOTE — Telephone Encounter (Signed)
Pt has no known CAD with neg cath 2009 and myoview 2014 Lets get repeat myoview  [id number]

## 2016-08-20 NOTE — Telephone Encounter (Signed)
I called and spoke with the patient and advised him of Dr. Olin Pia recommendations to obtain a nuclear stress test.  He is agreeable.  I advised I will try to get our schedulers to arrange this for him at [facility].

## 2016-08-20 NOTE — Addendum Note (Signed)
Addended byAlvis Lemmings C on: 08/20/2016 11:13 AM   Modules accepted: Orders

## 2016-08-27 ENCOUNTER — Other Ambulatory Visit: Payer: Self-pay

## 2016-08-27 MED ORDER — LISINOPRIL 5 MG PO TABS: 5.0000 mg | ORAL_TABLET | Freq: Every day | ORAL | 0 refills | Status: DC

## 2016-08-27 MED ORDER — CARVEDILOL 25 MG PO TABS: ORAL_TABLET | ORAL | 0 refills | Status: DC

## 2016-08-27 MED ORDER — FUROSEMIDE 20 MG PO TABS: 20.0000 mg | ORAL_TABLET | Freq: Every day | ORAL | 0 refills | Status: DC

## 2016-08-30 ENCOUNTER — Inpatient Hospital Stay (HOSPITAL_COMMUNITY): Admission: RE | Admit: 2016-08-30 | Payer: Medicare Other | Source: Ambulatory Visit

## 2016-08-30 ENCOUNTER — Encounter (HOSPITAL_COMMUNITY): Payer: Self-pay

## 2016-08-30 ENCOUNTER — Encounter (HOSPITAL_COMMUNITY)
Admission: RE | Admit: 2016-08-30 | Discharge: 2016-08-30 | Disposition: A | Discharge status: Home / Self Care | Payer: Medicare Other | Source: Ambulatory Visit | Attending: Internal Medicine | Admitting: Internal Medicine

## 2016-08-30 DIAGNOSIS — R0789 Other chest pain: Secondary | ICD-10-CM | POA: Diagnosis not present

## 2016-08-30 HISTORY — DX: Disorder of kidney and ureter, unspecified: N28.9

## 2016-08-30 LAB — NM MYOCAR MULTI W/SPECT W/WALL MOTION / EF
CHL CUP NUCLEAR SDS: 1
CHL CUP NUCLEAR SRS: 4
CHL CUP NUCLEAR SSS: 5
CHL CUP RESTING HR STRESS: 85 {beats}/min
LHR: 0.29
LV dias vol: 88 mL (ref 62–150)
LV sys vol: 35 mL
NUC STRESS TID: 1
Peak HR: 120 {beats}/min

## 2016-08-30 MED ORDER — REGADENOSON 0.4 MG/5ML IV SOLN
INTRAVENOUS | Status: AC
  Administered 2016-08-30: 0.4 mg via INTRAVENOUS
  Filled 2016-08-30: qty 5

## 2016-08-30 MED ORDER — SODIUM CHLORIDE 0.9% FLUSH
INTRAVENOUS | Status: AC
  Administered 2016-08-30: 10 mL via INTRAVENOUS
  Filled 2016-08-30: qty 10

## 2016-08-30 MED ORDER — TECHNETIUM TC 99M TETROFOSMIN IV KIT
30.0000 | PACK | Freq: Once | INTRAVENOUS | Status: AC | PRN
  Administered 2016-08-30: 31 via INTRAVENOUS

## 2016-08-30 MED ORDER — TECHNETIUM TC 99M TETROFOSMIN IV KIT
10.0000 | PACK | Freq: Once | INTRAVENOUS | Status: AC | PRN
Start: 2016-08-30 — End: 2016-08-30
  Administered 2016-08-30: 9.8 via INTRAVENOUS

## 2016-09-03 ENCOUNTER — Other Ambulatory Visit: Payer: Medicare Other | Admitting: *Deleted

## 2016-09-03 ENCOUNTER — Encounter: Payer: Self-pay | Admitting: Internal Medicine

## 2016-09-03 DIAGNOSIS — I428 Other cardiomyopathies: Secondary | ICD-10-CM | POA: Diagnosis not present

## 2016-09-03 DIAGNOSIS — R201 Hypoesthesia of skin: Secondary | ICD-10-CM | POA: Diagnosis not present

## 2016-09-03 DIAGNOSIS — Z01812 Encounter for preprocedural laboratory examination: Secondary | ICD-10-CM

## 2016-09-03 DIAGNOSIS — E1140 Type 2 diabetes mellitus with diabetic neuropathy, unspecified: Secondary | ICD-10-CM | POA: Diagnosis not present

## 2016-09-03 DIAGNOSIS — Z1389 Encounter for screening for other disorder: Secondary | ICD-10-CM | POA: Diagnosis not present

## 2016-09-03 DIAGNOSIS — E1129 Type 2 diabetes mellitus with other diabetic kidney complication: Secondary | ICD-10-CM | POA: Diagnosis not present

## 2016-09-03 DIAGNOSIS — Z23 Encounter for immunization: Secondary | ICD-10-CM | POA: Diagnosis not present

## 2016-09-03 NOTE — Progress Notes (Signed)
CALLED to review results. Myoview was normal echo was normal. We discussed twice generator replacement in the context of normalized LV function. It is intermediate on the AUC scale; he would like to replace the generator.

## 2016-09-04 LAB — CBC WITH DIFFERENTIAL/PLATELET
BASOS: 1 %
Basophils Absolute: 0 10*3/uL (ref 0.0–0.2)
EOS (ABSOLUTE): 0.1 10*3/uL (ref 0.0–0.4)
Eos: 2 %
HEMATOCRIT: 43.5 % (ref 37.5–51.0)
Hemoglobin: 15.2 g/dL (ref 13.0–17.7)
IMMATURE GRANS (ABS): 0 10*3/uL (ref 0.0–0.1)
IMMATURE GRANULOCYTES: 0 %
LYMPHS: 14 %
Lymphocytes Absolute: 0.9 10*3/uL (ref 0.7–3.1)
MCH: 31.8 pg (ref 26.6–33.0)
MCHC: 34.9 g/dL (ref 31.5–35.7)
MCV: 91 fL (ref 79–97)
MONOS ABS: 0.4 10*3/uL (ref 0.1–0.9)
Monocytes: 7 %
NEUTROS ABS: 4.5 10*3/uL (ref 1.4–7.0)
Neutrophils: 76 %
PLATELETS: 121 10*3/uL — AB (ref 150–379)
RBC: 4.78 x10E6/uL (ref 4.14–5.80)
RDW: 14.1 % (ref 12.3–15.4)
WBC: 5.9 10*3/uL (ref 3.4–10.8)

## 2016-09-04 LAB — PROTIME-INR
INR: 1.1 (ref 0.8–1.2)
Prothrombin Time: 11.9 s (ref 9.1–12.0)

## 2016-09-04 LAB — BASIC METABOLIC PANEL
BUN/Creatinine Ratio: 12 (ref 9–20)
BUN: 18 mg/dL (ref 6–24)
CO2: 23 mmol/L (ref 18–29)
CREATININE: 1.51 mg/dL — AB (ref 0.76–1.27)
Calcium: 9.1 mg/dL (ref 8.7–10.2)
Chloride: 97 mmol/L (ref 96–106)
GFR, EST AFRICAN AMERICAN: 58 mL/min/{1.73_m2} — AB (ref >59)
GFR, EST NON AFRICAN AMERICAN: 51 mL/min/{1.73_m2} — AB (ref >59)
Glucose: 162 mg/dL — ABNORMAL HIGH (ref 65–99)
POTASSIUM: 4.1 mmol/L (ref 3.5–5.2)
SODIUM: 138 mmol/L (ref 134–144)

## 2016-09-09 NOTE — H&P (Signed)
Patient Care Team: Redmond School, MD as PCP - General (Internal Medicine) Redmond School, MD (Internal Medicine) Evans Lance, MD (Cardiology) Yehuda Savannah, MD as Attending Physician (Cardiology)   HPI  Mike Floyd is a 56 y.o. male Admitted for generator replacement of an ICD   He underwent initial implantation 4/09 for primary prevention. He has been followed in [place];  he has reached ERI.  Catheterization 2009 demonstrated no obstructive coronary disease ejection fraction of 35-40%  Myoview 2014 demonstrated interval normalization of left ventricular function   Notably, he is on aspirin and Plavix. Review of the discharge summary 8/13 identifies that the former was supposed to be discontinued.  He has complaints of progressive shortness of breath over the last few years.  Records dating back to 2014 were reviewed. He has a history of WPW status post ablation in 1999Records and    He reports this am that he has had burning near his Device for the last few weeks;  He had significant pain last night with thumping sensation   On device interrogation this am there is a marked subacute and gradual change in the LV pacing impedance and change in threshold   Past Medical History:  Diagnosis Date  . AICD (automatic cardioverter/defibrillator) present 11/2007   a. 11/2007 SJM Current VR - single lead ICD  . Anxiety   . Chest pain    a. 10/2007 Cath:  normal Cors.  . CKD (chronic kidney disease), stage II   . DDD (degenerative disc disease), lumbar   . Diabetes mellitus DX: 2010  . Erosive esophagitis    a. per EGD (08/2011), Dr. Laural Golden - Erosive reflux esophagitis improved but not completely healed since previous EGD 3 years ago. Bx showing  ulcerated gatroesophageal junction mucosa. negative for H. pylori  . GERD (gastroesophageal reflux disease)   . Gout   . Hearing deficit    a. wear bilateral hearing aides  . Hypertension   . Myocardial infarction  2011  . Nonischemic dilated cardiomyopathy ([place])    a. H/O EF as low as 35-40% by LV gram 10/2007;  b. Echo 02/2011 EF 50-55%, inf HK, Gr 1 DD   . Renal insufficiency   . Sleep apnea    pt doesnt use, states"I cant afford one". PCP aware  . Stroke ([place])    mini-stroke in 2014  . TIA (transient ischemic attack)    July, 2013  . Tobacco abuse, in remission 06/27/2009   Discontinued in 2009    . WPW (Wolff-Parkinson-White syndrome)    a. s/p RFCA @ [place]    Past Surgical History:  Procedure Laterality Date  . APPENDECTOMY    . BACK SURGERY     1995  . CARDIAC DEFIBRILLATOR PLACEMENT    . CHOLECYSTECTOMY    . CHOLECYSTECTOMY, LAPAROSCOPIC     11/2007  . COLONOSCOPY W/ POLYPECTOMY  2009  . ELBOW SURGERY Left 06/2010  . ESOPHAGEAL DILATION N/A 11/08/2014   Procedure: ESOPHAGEAL DILATION;  Surgeon: Rogene Houston, MD;  Location: [facility];  Service: Endoscopy;  Laterality: N/A;  #56,   . ESOPHAGOGASTRODUODENOSCOPY  03/31/2012   also 08/2011; [name]  . ESOPHAGOGASTRODUODENOSCOPY (EGD) WITH PROPOFOL N/A 11/08/2014   Procedure: ESOPHAGOGASTRODUODENOSCOPY (EGD) WITH PROPOFOL;  Surgeon: Rogene Houston, MD;  Location: [facility];  Service: Endoscopy;  Laterality: N/A;  Hiatus is 39 , GE Junction is 37  . [place]; performed at [facility]  Current Facility-Administered Medications  Medication Dose Route Frequency Provider Last Rate Last Dose  . 0.9 %  sodium chloride infusion   Intravenous Continuous Deboraha Sprang, MD 50 mL/hr at 09/10/16 [phone]    . ceFAZolin (ANCEF) IVPB 2g/100 mL premix  2 g Intravenous On Call Deboraha Sprang, MD      . chlorhexidine (HIBICLENS) 4 % liquid 4 application  60 mL Topical Once Deboraha Sprang, MD      . gentamicin (GARAMYCIN) 80 mg in sodium chloride irrigation 0.9 % 500 mL irrigation  80 mg Irrigation On Call Deboraha Sprang, MD       Current Outpatient Prescriptions  Medication Sig Dispense Refill  . amitriptyline (ELAVIL) 50 MG  tablet Take 50 mg by mouth at bedtime.     Marland Kitchen aspirin 81 MG tablet Take 81 mg by mouth daily as needed for pain.    Marland Kitchen atorvastatin (LIPITOR) 20 MG tablet Take 20 mg by mouth daily.    Marland Kitchen CALCIUM PO Take 1 tablet by mouth daily.    . carvedilol (COREG) 25 MG tablet TAKE 2 TABLETS BY MOUTH TWICE DAILY. (Patient taking differently: Take 50 mg by mouth 2 (two) times daily with a meal. TAKE 2 TABLETS BY MOUTH TWICE DAILY.) 360 tablet 0  . clopidogrel (PLAVIX) 75 MG tablet Take 75 mg by mouth daily.    . colchicine 0.6 MG tablet Take 0.12 mg by mouth 2 (two) times daily.     . furosemide (LASIX) 20 MG tablet Take 1 tablet (20 mg total) by mouth daily. 90 tablet 0  . gabapentin (NEURONTIN) 300 MG capsule Take 300 mg by mouth 3 (three) times daily.     Marland Kitchen glimepiride (AMARYL) 4 MG tablet Take 4 mg by mouth daily before breakfast.    . L-Methylfolate-B6-B12 (FOLTANX) 3-35-2 MG TABS Take 1 tablet by mouth 2 (two) times daily.    Marland Kitchen lisinopril (PRINIVIL,ZESTRIL) 5 MG tablet Take 1 tablet (5 mg total) by mouth daily. 90 tablet 0  . LORazepam (ATIVAN) 1 MG tablet Take 1 mg by mouth 3 (three) times daily.     . metoCLOPramide (REGLAN) 10 MG tablet TAKE 1 TABLET BY MOUTH 30 MINUTES BEFORE BREAKFAST AND EVENING MEAL DAILY. 60 tablet 2  . omeprazole (PRILOSEC) 40 MG capsule TAKE (1) CAPSULE BY MOUTH ONCE DAILY. 30 capsule 6  . oxycodone (ROXICODONE) 30 MG immediate release tablet Take 1-2 tablets by mouth 4 (four) times daily as needed for pain.     Marland Kitchen sucralfate (CARAFATE) 1 g tablet TAKE 1 TABLET BY MOUTH 30 MINTUES BEFORE MEALS AND AT BEDTIME. (Patient taking differently: TAKE 1 TABLET BY MOUTH 30 MINTUES BEFORE EACH MEALS AND AT BEDTIME.) 120 tablet 5    No Known Allergies    Social History  Substance Use Topics  . Smoking status: Former Smoker    Packs/day: 1.50    Years: 35.00    Types: Cigarettes    Start date: 06/30/1971    Quit date: 07/30/2010  . Smokeless tobacco: Former Systems developer    Types: Snuff     Quit date: 03/26/1994     Comment: currently uses a vape 11-06-2014.  Marland Kitchen Alcohol use No     Comment: used "years ago"     Family History  Problem Relation Age of Onset  . Heart attack Mother 58  . Hypertension Mother   . Diabetes Mother   . Kidney disease Mother   . Breast cancer Mother   . Heart  attack Father 70  . Hypertension Father   . Diabetes Father   . Stroke Brother   . Heart attack Brother 63  . Stroke Maternal Grandmother 80  . Diabetes Brother   . Hypertension Brother      No current facility-administered medications on file prior to encounter.    Current Outpatient Prescriptions on File Prior to Encounter  Medication Sig Dispense Refill  . amitriptyline (ELAVIL) 50 MG tablet Take 50 mg by mouth at bedtime.     Marland Kitchen atorvastatin (LIPITOR) 20 MG tablet Take 20 mg by mouth daily.    . clopidogrel (PLAVIX) 75 MG tablet Take 75 mg by mouth daily.    . colchicine 0.6 MG tablet Take 0.12 mg by mouth 2 (two) times daily.     Marland Kitchen gabapentin (NEURONTIN) 300 MG capsule Take 300 mg by mouth 3 (three) times daily.     Marland Kitchen glimepiride (AMARYL) 4 MG tablet Take 4 mg by mouth daily before breakfast.    . L-Methylfolate-B6-B12 (FOLTANX) 3-35-2 MG TABS Take 1 tablet by mouth 2 (two) times daily.    Marland Kitchen LORazepam (ATIVAN) 1 MG tablet Take 1 mg by mouth 3 (three) times daily.     . metoCLOPramide (REGLAN) 10 MG tablet TAKE 1 TABLET BY MOUTH 30 MINUTES BEFORE BREAKFAST AND EVENING MEAL DAILY. 60 tablet 2  . omeprazole (PRILOSEC) 40 MG capsule TAKE (1) CAPSULE BY MOUTH ONCE DAILY. 30 capsule 6  . oxycodone (ROXICODONE) 30 MG immediate release tablet Take 1-2 tablets by mouth 4 (four) times daily as needed for pain.     Marland Kitchen sucralfate (CARAFATE) 1 g tablet TAKE 1 TABLET BY MOUTH 30 MINTUES BEFORE MEALS AND AT BEDTIME. (Patient taking differently: TAKE 1 TABLET BY MOUTH 30 MINTUES BEFORE EACH MEALS AND AT BEDTIME.) 120 tablet 5      Review of Systems negative except from HPI and PMH  Physical  Exam BP 97/74   Pulse 81   Temp 97.8 F (36.6 C) (Oral)   Resp 18   Ht 6\' 1"  (1.854 m)   Wt 230 lb (104.3 kg)   SpO2 98%   BMI 30.34 kg/m  Well developed and well nourished in no acute distress HENT normal E scleral and icterus clear Neck Supple JVP flat; carotids brisk and full Clear to ausculation  Device pocket well healed; without hematoma or erythema.  There is no tethering *Regular rate and rhythm, no murmurs gallops or rub Soft with active bowel sounds No clubbing cyanosis  Edema Alert and oriented, grossly normal motor and sensory function Skin Warm and Dry    Assessment and  Plan   Nonischemic cardiomyopathy interval resolution 2013  TIA 2013  Hypotension  Dyspnea on exertion  Lead failure  Obstructive sleep apnea    There is now evidence of system failure.  The changing parameters point to the lead but the continuous and rapid change of the impedance and the discomfort assoc with the generator makes me worry that the problem is in the device not the lead.  If it were a lead and given his young age, after conversation with Dr Elliot Cousin both agreeing taht if it were lead failure then it should be explanted and if it is not a lead issue, then with recovery of LV function maybe he doesn't need anythng  Hence after review with the family 1) postpone procedure 2) outpt consult with Dr Elliot Cousin 3) anticipate replacement of the device with option to remove ICD lead if it is the  source of failure and decision prior to that if reimplant should be pursued  Spent about 45 min

## 2016-09-10 ENCOUNTER — Ambulatory Visit (HOSPITAL_COMMUNITY)
Admission: RE | Admit: 2016-09-10 | Discharge: 2016-09-10 | Disposition: A | Discharge status: Home / Self Care | Payer: Medicare Other | Source: Ambulatory Visit | Attending: Internal Medicine | Admitting: Internal Medicine

## 2016-09-10 ENCOUNTER — Encounter (HOSPITAL_COMMUNITY)
Admission: RE | Disposition: A | Discharge status: Home / Self Care | Payer: Self-pay | Source: Ambulatory Visit | Attending: Internal Medicine

## 2016-09-10 DIAGNOSIS — G4733 Obstructive sleep apnea (adult) (pediatric): Secondary | ICD-10-CM | POA: Insufficient documentation

## 2016-09-10 DIAGNOSIS — K210 Gastro-esophageal reflux disease with esophagitis: Secondary | ICD-10-CM | POA: Insufficient documentation

## 2016-09-10 DIAGNOSIS — I252 Old myocardial infarction: Secondary | ICD-10-CM | POA: Insufficient documentation

## 2016-09-10 DIAGNOSIS — F419 Anxiety disorder, unspecified: Secondary | ICD-10-CM | POA: Insufficient documentation

## 2016-09-10 DIAGNOSIS — Z7984 Long term (current) use of oral hypoglycemic drugs: Secondary | ICD-10-CM | POA: Diagnosis not present

## 2016-09-10 DIAGNOSIS — Z823 Family history of stroke: Secondary | ICD-10-CM | POA: Diagnosis not present

## 2016-09-10 DIAGNOSIS — I129 Hypertensive chronic kidney disease with stage 1 through stage 4 chronic kidney disease, or unspecified chronic kidney disease: Secondary | ICD-10-CM | POA: Diagnosis not present

## 2016-09-10 DIAGNOSIS — Z8249 Family history of ischemic heart disease and other diseases of the circulatory system: Secondary | ICD-10-CM | POA: Diagnosis not present

## 2016-09-10 DIAGNOSIS — Z87891 Personal history of nicotine dependence: Secondary | ICD-10-CM | POA: Diagnosis not present

## 2016-09-10 DIAGNOSIS — Z7982 Long term (current) use of aspirin: Secondary | ICD-10-CM | POA: Insufficient documentation

## 2016-09-10 DIAGNOSIS — M5136 Other intervertebral disc degeneration, lumbar region: Secondary | ICD-10-CM | POA: Insufficient documentation

## 2016-09-10 DIAGNOSIS — M109 Gout, unspecified: Secondary | ICD-10-CM | POA: Diagnosis not present

## 2016-09-10 DIAGNOSIS — I428 Other cardiomyopathies: Secondary | ICD-10-CM

## 2016-09-10 DIAGNOSIS — Z95810 Presence of automatic (implantable) cardiac defibrillator: Secondary | ICD-10-CM | POA: Diagnosis not present

## 2016-09-10 DIAGNOSIS — Z539 Procedure and treatment not carried out, unspecified reason: Secondary | ICD-10-CM | POA: Diagnosis not present

## 2016-09-10 DIAGNOSIS — E1122 Type 2 diabetes mellitus with diabetic chronic kidney disease: Secondary | ICD-10-CM | POA: Diagnosis not present

## 2016-09-10 DIAGNOSIS — Z833 Family history of diabetes mellitus: Secondary | ICD-10-CM | POA: Insufficient documentation

## 2016-09-10 DIAGNOSIS — Z803 Family history of malignant neoplasm of breast: Secondary | ICD-10-CM | POA: Diagnosis not present

## 2016-09-10 DIAGNOSIS — Z8673 Personal history of transient ischemic attack (TIA), and cerebral infarction without residual deficits: Secondary | ICD-10-CM | POA: Insufficient documentation

## 2016-09-10 DIAGNOSIS — T82110A Breakdown (mechanical) of cardiac electrode, initial encounter: Secondary | ICD-10-CM

## 2016-09-10 DIAGNOSIS — I456 Pre-excitation syndrome: Secondary | ICD-10-CM | POA: Diagnosis not present

## 2016-09-10 DIAGNOSIS — N182 Chronic kidney disease, stage 2 (mild): Secondary | ICD-10-CM | POA: Insufficient documentation

## 2016-09-10 DIAGNOSIS — I959 Hypotension, unspecified: Secondary | ICD-10-CM | POA: Diagnosis not present

## 2016-09-10 DIAGNOSIS — I420 Dilated cardiomyopathy: Secondary | ICD-10-CM | POA: Diagnosis not present

## 2016-09-10 DIAGNOSIS — Z7902 Long term (current) use of antithrombotics/antiplatelets: Secondary | ICD-10-CM | POA: Diagnosis not present

## 2016-09-10 LAB — GLUCOSE, CAPILLARY: Glucose-Capillary: 96 mg/dL (ref 65–99)

## 2016-09-10 LAB — SURGICAL PCR SCREEN
MRSA, PCR: NEGATIVE
Staphylococcus aureus: NEGATIVE

## 2016-09-10 SURGERY — ICD GENERATOR CHANGEOUT
Anesthesia: LOCAL

## 2016-09-10 MED ORDER — CHLORHEXIDINE GLUCONATE 4 % EX LIQD
60.0000 mL | Freq: Once | CUTANEOUS | Status: DC
  Filled 2016-09-10: qty 60

## 2016-09-10 MED ORDER — CEFAZOLIN SODIUM-DEXTROSE 2-4 GM/100ML-% IV SOLN
INTRAVENOUS | Status: AC
  Filled 2016-09-10: qty 100

## 2016-09-10 MED ORDER — MUPIROCIN 2 % EX OINT
1.0000 "application " | TOPICAL_OINTMENT | Freq: Once | CUTANEOUS | Status: AC
  Administered 2016-09-10: 1 via TOPICAL
  Filled 2016-09-10: qty 22

## 2016-09-10 MED ORDER — MUPIROCIN 2 % EX OINT
TOPICAL_OINTMENT | CUTANEOUS | Status: AC
  Administered 2016-09-10: 1 via TOPICAL
  Filled 2016-09-10: qty 22

## 2016-09-10 MED ORDER — GENTAMICIN SULFATE 40 MG/ML IJ SOLN
INTRAMUSCULAR | Status: AC
  Filled 2016-09-10: qty 2

## 2016-09-10 MED ORDER — SODIUM CHLORIDE 0.9 % IV SOLN
INTRAVENOUS | Status: DC
  Administered 2016-09-10: 07:00:00 via INTRAVENOUS

## 2016-09-10 MED ORDER — SODIUM CHLORIDE 0.9 % IR SOLN
80.0000 mg | Status: DC
  Filled 2016-09-10: qty 2

## 2016-09-10 MED ORDER — CEFAZOLIN SODIUM-DEXTROSE 2-4 GM/100ML-% IV SOLN
2.0000 g | INTRAVENOUS | Status: DC
  Filled 2016-09-10: qty 100

## 2016-09-10 NOTE — Progress Notes (Signed)
Pt's procedures cancelled by Dr. Caryl Comes after pt ready. Pt given instructions per Dr. Caryl Comes as to follow up appointment

## 2016-09-30 ENCOUNTER — Ambulatory Visit (INDEPENDENT_AMBULATORY_CARE_PROVIDER_SITE_OTHER): Payer: Medicare Other | Admitting: Internal Medicine

## 2016-09-30 ENCOUNTER — Encounter: Payer: Self-pay | Admitting: Internal Medicine

## 2016-09-30 VITALS — BP 84/60 | HR 88 | Ht 73.0 in | Wt 229.0 lb

## 2016-09-30 DIAGNOSIS — T82118A Breakdown (mechanical) of other cardiac electronic device, initial encounter: Secondary | ICD-10-CM | POA: Diagnosis not present

## 2016-09-30 MED ORDER — CARVEDILOL 25 MG PO TABS: 25.0000 mg | ORAL_TABLET | Freq: Two times a day (BID) | ORAL | 3 refills | Status: DC

## 2016-09-30 NOTE — Progress Notes (Signed)
HPI [name] returns today for followup. He is a very pleasant 56 year old man with a nonischemic cardiomyopathy, chronic congestive heart failure, status post ICD implantation. The patient has reached ERI on his device and was to come in and have a generator change out when he was found to have a new increase in his pacing impedence and also c/o severe pain and warmth around his device. He returns today to discuss system extraction. He has been feeling very weak and lightheaded.   .No Known Allergies   Current Outpatient Prescriptions  Medication Sig Dispense Refill  . amitriptyline (ELAVIL) 50 MG tablet Take 50 mg by mouth at bedtime.     Marland Kitchen aspirin 81 MG tablet Take 81 mg by mouth daily as needed for pain.    Marland Kitchen atorvastatin (LIPITOR) 20 MG tablet Take 20 mg by mouth daily.    Marland Kitchen CALCIUM PO Take 1 tablet by mouth daily.    . carvedilol (COREG) 25 MG tablet TAKE 2 TABLETS BY MOUTH TWICE DAILY. (Patient taking differently: Take 50 mg by mouth 2 (two) times daily with a meal. TAKE 2 TABLETS BY MOUTH TWICE DAILY.) 360 tablet 0  . clopidogrel (PLAVIX) 75 MG tablet Take 75 mg by mouth daily.    . colchicine 0.6 MG tablet Take 0.12 mg by mouth 2 (two) times daily.     . furosemide (LASIX) 20 MG tablet Take 1 tablet (20 mg total) by mouth daily. 90 tablet 0  . gabapentin (NEURONTIN) 300 MG capsule Take 300 mg by mouth 3 (three) times daily.     Marland Kitchen glimepiride (AMARYL) 4 MG tablet Take 4 mg by mouth daily before breakfast.    . L-Methylfolate-B6-B12 (FOLTANX) 3-35-2 MG TABS Take 1 tablet by mouth 2 (two) times daily.    Marland Kitchen lisinopril (PRINIVIL,ZESTRIL) 5 MG tablet Take 1 tablet (5 mg total) by mouth daily. 90 tablet 0  . LORazepam (ATIVAN) 1 MG tablet Take 1 mg by mouth 3 (three) times daily.     . metoCLOPramide (REGLAN) 10 MG tablet TAKE 1 TABLET BY MOUTH 30 MINUTES BEFORE BREAKFAST AND EVENING MEAL DAILY. 60 tablet 2  . omeprazole (PRILOSEC) 40 MG capsule TAKE (1) CAPSULE BY MOUTH ONCE DAILY. 30  capsule 6  . oxycodone (ROXICODONE) 30 MG immediate release tablet Take 1-2 tablets by mouth 4 (four) times daily as needed for pain.     Marland Kitchen sucralfate (CARAFATE) 1 g tablet TAKE 1 TABLET BY MOUTH 30 MINTUES BEFORE MEALS AND AT BEDTIME. (Patient taking differently: TAKE 1 TABLET BY MOUTH 30 MINTUES BEFORE EACH MEALS AND AT BEDTIME.) 120 tablet 5   No current facility-administered medications for this visit.      Past Medical History:  Diagnosis Date  . AICD (automatic cardioverter/defibrillator) present 11/2007   a. 11/2007 SJM Current VR - single lead ICD  . Anxiety   . Chest pain    a. 10/2007 Cath:  normal Cors.  . CKD (chronic kidney disease), stage II   . DDD (degenerative disc disease), lumbar   . Diabetes mellitus DX: 2010  . Erosive esophagitis    a. per EGD (08/2011), Dr. Laural Golden - Erosive reflux esophagitis improved but not completely healed since previous EGD 3 years ago. Bx showing  ulcerated gatroesophageal junction mucosa. negative for H. pylori  . GERD (gastroesophageal reflux disease)   . Gout   . Hearing deficit    a. wear bilateral hearing aides  . Hypertension   . Myocardial infarction 2011  . Nonischemic dilated  cardiomyopathy ([place])    a. H/O EF as low as 35-40% by LV gram 10/2007;  b. Echo 02/2011 EF 50-55%, inf HK, Gr 1 DD   . Renal insufficiency   . Sleep apnea    pt doesnt use, states"I cant afford one". PCP aware  . Stroke ([place])    mini-stroke in 2014  . TIA (transient ischemic attack)    July, 2013  . Tobacco abuse, in remission 06/27/2009   Discontinued in 2009    . WPW (Wolff-Parkinson-White syndrome)    a. s/p RFCA @ [facility]:   All systems reviewed and negative except as noted in the HPI.   Past Surgical History:  Procedure Laterality Date  . APPENDECTOMY    . BACK SURGERY     1995  . CARDIAC DEFIBRILLATOR PLACEMENT    . CHOLECYSTECTOMY    . CHOLECYSTECTOMY, LAPAROSCOPIC     11/2007  . COLONOSCOPY W/ POLYPECTOMY  2009  . ELBOW  SURGERY Left 06/2010  . ESOPHAGEAL DILATION N/A 11/08/2014   Procedure: ESOPHAGEAL DILATION;  Surgeon: Rogene Houston, MD;  Location: [facility];  Service: Endoscopy;  Laterality: N/A;  #56,   . ESOPHAGOGASTRODUODENOSCOPY  03/31/2012   also 08/2011; [name]  . ESOPHAGOGASTRODUODENOSCOPY (EGD) WITH PROPOFOL N/A 11/08/2014   Procedure: ESOPHAGOGASTRODUODENOSCOPY (EGD) WITH PROPOFOL;  Surgeon: Rogene Houston, MD;  Location: [facility];  Service: Endoscopy;  Laterality: N/A;  Hiatus is 39 , GE Junction is 37  . [place]   WPW; performed at [facility] [facility] History  Problem Relation Age of Onset  . Heart attack Mother 22  . Hypertension Mother   . Diabetes Mother   . Kidney disease Mother   . Breast cancer Mother   . Heart attack Father 55  . Hypertension Father   . Diabetes Father   . Stroke Brother   . Heart attack Brother 29  . Stroke Maternal Grandmother 80  . Diabetes Brother   . Hypertension Brother      Social History   Social History  . Marital status: Married    Spouse name: N/A  . Number of children: N/A  . Years of education: N/A   Occupational History  . Worked for [employer] off in 11/11   Social History Main Topics  . Smoking status: Former Smoker    Packs/day: 1.50    Years: 35.00    Types: Cigarettes    Start date: 06/30/1971    Quit date: 07/30/2010  . Smokeless tobacco: Former Systems developer    Types: Snuff    Quit date: 03/26/1994     Comment: currently uses a vape 11-06-2014.  Marland Kitchen Alcohol use No     Comment: used "years ago"  . Drug use: No  . Sexual activity: No   Other Topics Concern  . Not on file   Social History Narrative  . No narrative on file     BP (!) 84/60   Pulse 88   Ht 6\' 1"  (1.854 m)   Wt 229 lb (103.9 kg)   SpO2 99%   BMI 30.21 kg/m   Physical Exam:  Well appearing middle-aged man, NAD HEENT: Unremarkable Neck:  7 cm JVD, no thyromegally Back:  No CVA tenderness Lungs:  Clear except for  scattered basilar rales, no wheezes, no rhonchi. HEART:  Regular rate rhythm, no murmurs, no rubs, no clicks Abd:  soft, positive bowel sounds, no organomegally,  no rebound, no guarding Ext:  2 plus pulses, no edema, no cyanosis, no clubbing Skin:  No rashes no nodules Neuro:  CN II through XII intact, motor grossly intact  DEVICE  Normal device function.  See PaceArt for details.   Assess/Plan: 1. ICD lead with an elevated impedence - we have discussed the treatment options in detail. He will undergo ICD system removal. Will not plan to place a new device as had normalization of his LV function. 2. Chronic systolic heart failure - as above. His EF has normalized. He c/o lightheadedness and his BP is low. We will ask him to reduce his dose of coreg to 25 bid. 3. ICD - his device is at [facility]. Will remove the system but no plan to place a new device.  Mikle Bosworth.D.

## 2016-09-30 NOTE — Patient Instructions (Signed)
Our office will call with date and time of ICD Lead Extraction   Your physician has recommended you make the following change in your medication:   Decrease Coreg to 25 Mg Two Times Daily   If you need a refill on your cardiac medications before your next appointment, please call your pharmacy.  Thank you for choosing [facility]!

## 2016-10-04 ENCOUNTER — Encounter: Payer: Self-pay | Admitting: *Deleted

## 2016-10-04 ENCOUNTER — Telehealth: Payer: Self-pay | Admitting: *Deleted

## 2016-10-04 DIAGNOSIS — I428 Other cardiomyopathies: Secondary | ICD-10-CM

## 2016-10-04 NOTE — Telephone Encounter (Signed)
-----   Message from Laury Deep, RN sent at 10/01/2016  8:20 AM EST ----- Sounds good.  Colletta Maryland with [facility] will be booking him for Thursday 3/1 @ 2:30pm in OR 16.  Dr. Cyndia Bent will be the back up surgeon for the case.  You can go ahead and let the patient know and place your orders.  Thanks, Ryan ----- Message ----- From: Levonne Hubert, LPN Sent: [id number]   5:08 PM To: Laury Deep, RN  Spoke with Crystal in cath lab. He should be done by 2:30pm on 3/1.  ----- Message ----- From: Laury Deep, RN Sent: 09/30/2016   3:51 PM To: Marikay Alar Bonni Neuser, LPN  Hey,   The only day [facility] can release their RM 16 will be Thursday 3/1.  What time will Dr. Lovena Le be done in the cath lab?    Thanks, [employer]

## 2016-10-06 ENCOUNTER — Encounter (HOSPITAL_COMMUNITY)
Admission: RE | Admit: 2016-10-06 | Discharge: 2016-10-06 | Disposition: A | Discharge status: Home / Self Care | Payer: Medicare Other | Source: Ambulatory Visit | Attending: Internal Medicine | Admitting: Internal Medicine

## 2016-10-06 ENCOUNTER — Encounter (HOSPITAL_COMMUNITY): Payer: Self-pay

## 2016-10-06 DIAGNOSIS — I252 Old myocardial infarction: Secondary | ICD-10-CM | POA: Diagnosis not present

## 2016-10-06 DIAGNOSIS — Z95810 Presence of automatic (implantable) cardiac defibrillator: Secondary | ICD-10-CM | POA: Diagnosis not present

## 2016-10-06 DIAGNOSIS — N182 Chronic kidney disease, stage 2 (mild): Secondary | ICD-10-CM | POA: Diagnosis not present

## 2016-10-06 DIAGNOSIS — Z8249 Family history of ischemic heart disease and other diseases of the circulatory system: Secondary | ICD-10-CM | POA: Diagnosis not present

## 2016-10-06 DIAGNOSIS — T82518A Breakdown (mechanical) of other cardiac and vascular devices and implants, initial encounter: Secondary | ICD-10-CM | POA: Diagnosis not present

## 2016-10-06 DIAGNOSIS — Z9049 Acquired absence of other specified parts of digestive tract: Secondary | ICD-10-CM | POA: Diagnosis not present

## 2016-10-06 DIAGNOSIS — F419 Anxiety disorder, unspecified: Secondary | ICD-10-CM | POA: Diagnosis not present

## 2016-10-06 DIAGNOSIS — Z7982 Long term (current) use of aspirin: Secondary | ICD-10-CM | POA: Diagnosis not present

## 2016-10-06 DIAGNOSIS — Y939 Activity, unspecified: Secondary | ICD-10-CM | POA: Diagnosis not present

## 2016-10-06 DIAGNOSIS — I509 Heart failure, unspecified: Secondary | ICD-10-CM | POA: Diagnosis not present

## 2016-10-06 DIAGNOSIS — Z7984 Long term (current) use of oral hypoglycemic drugs: Secondary | ICD-10-CM | POA: Diagnosis not present

## 2016-10-06 DIAGNOSIS — Z803 Family history of malignant neoplasm of breast: Secondary | ICD-10-CM | POA: Diagnosis not present

## 2016-10-06 DIAGNOSIS — I130 Hypertensive heart and chronic kidney disease with heart failure and stage 1 through stage 4 chronic kidney disease, or unspecified chronic kidney disease: Secondary | ICD-10-CM | POA: Diagnosis not present

## 2016-10-06 DIAGNOSIS — E1122 Type 2 diabetes mellitus with diabetic chronic kidney disease: Secondary | ICD-10-CM | POA: Diagnosis not present

## 2016-10-06 DIAGNOSIS — X58XXXA Exposure to other specified factors, initial encounter: Secondary | ICD-10-CM | POA: Diagnosis not present

## 2016-10-06 DIAGNOSIS — Z823 Family history of stroke: Secondary | ICD-10-CM | POA: Diagnosis not present

## 2016-10-06 DIAGNOSIS — Z87891 Personal history of nicotine dependence: Secondary | ICD-10-CM | POA: Diagnosis not present

## 2016-10-06 DIAGNOSIS — K219 Gastro-esophageal reflux disease without esophagitis: Secondary | ICD-10-CM | POA: Diagnosis not present

## 2016-10-06 DIAGNOSIS — G4730 Sleep apnea, unspecified: Secondary | ICD-10-CM | POA: Diagnosis not present

## 2016-10-06 DIAGNOSIS — M5136 Other intervertebral disc degeneration, lumbar region: Secondary | ICD-10-CM | POA: Diagnosis not present

## 2016-10-06 DIAGNOSIS — Z79899 Other long term (current) drug therapy: Secondary | ICD-10-CM | POA: Diagnosis not present

## 2016-10-06 DIAGNOSIS — Z833 Family history of diabetes mellitus: Secondary | ICD-10-CM | POA: Diagnosis not present

## 2016-10-06 DIAGNOSIS — Z841 Family history of disorders of kidney and ureter: Secondary | ICD-10-CM | POA: Diagnosis not present

## 2016-10-06 DIAGNOSIS — Z8673 Personal history of transient ischemic attack (TIA), and cerebral infarction without residual deficits: Secondary | ICD-10-CM | POA: Diagnosis not present

## 2016-10-06 HISTORY — DX: Presence of dental prosthetic device (complete) (partial): Z97.2

## 2016-10-06 HISTORY — DX: Personal history of other diseases of the digestive system: Z87.19

## 2016-10-06 LAB — BASIC METABOLIC PANEL
ANION GAP: 5 (ref 5–15)
BUN: 13 mg/dL (ref 6–20)
CALCIUM: 9.6 mg/dL (ref 8.9–10.3)
CO2: 31 mmol/L (ref 22–32)
Chloride: 100 mmol/L — ABNORMAL LOW (ref 101–111)
Creatinine, Ser: 1.56 mg/dL — ABNORMAL HIGH (ref 0.61–1.24)
GFR, EST AFRICAN AMERICAN: 55 mL/min — AB (ref >60)
GFR, EST NON AFRICAN AMERICAN: 48 mL/min — AB (ref >60)
Glucose, Bld: 115 mg/dL — ABNORMAL HIGH (ref 65–99)
Potassium: 4.3 mmol/L (ref 3.5–5.1)
SODIUM: 136 mmol/L (ref 135–145)

## 2016-10-06 LAB — CBC
HCT: 43.7 % (ref 39.0–52.0)
Hemoglobin: 15.4 g/dL (ref 13.0–17.0)
MCH: 31.6 pg (ref 26.0–34.0)
MCHC: 35.2 g/dL (ref 30.0–36.0)
MCV: 89.7 fL (ref 78.0–100.0)
PLATELETS: 121 10*3/uL — AB (ref 150–400)
RBC: 4.87 MIL/uL (ref 4.22–5.81)
RDW: 14.3 % (ref 11.5–15.5)
WBC: 6.5 10*3/uL (ref 4.0–10.5)

## 2016-10-06 LAB — GLUCOSE, CAPILLARY: GLUCOSE-CAPILLARY: 164 mg/dL — AB (ref 65–99)

## 2016-10-06 LAB — ABO/RH: ABO/RH(D): O POS

## 2016-10-06 LAB — PROTIME-INR
INR: 1.13
PROTHROMBIN TIME: 14.6 s (ref 11.4–15.2)

## 2016-10-06 MED ORDER — SODIUM CHLORIDE 0.9 % IR SOLN
80.0000 mg | Status: DC
  Filled 2016-10-06: qty 2

## 2016-10-06 NOTE — Pre-Procedure Instructions (Signed)
Mike Floyd  10/06/2016      [facility] - [place], [place] [id number] [place] [id number] Phone: [phone] Fax: [phone]  [place], [place] [address] Phone: [phone] Fax: [phone]  [place], [place] [facility] [address] [place] [address] [place] [address] Phone: [phone] Fax: [phone]    Your procedure is scheduled on Thursday, October 07, 2016  Report to [facility] Admitting at 12: 30 P.M.  Call this number if you have problems the morning of surgery:  [phone]   Remember:  Do not eat food or drink liquids after midnight.  Take these medicines the morning of surgery with A SIP OF WATER:carvedilol (COREG), colchicine, gabapentin (NEURONTIN), LORazepam (ATIVAN),  omeprazole (PRILOSEC), metoCLOPramide (REGLAN),  If needed: oxycodone (ROXICODONE) for pain Stop taking vitamins and herbal medications. Do not take any NSAIDs ie: Ibuprofen, Advil, Naproxen, BC and Goody Powder;stop now.    How to Manage Your Diabetes Before and After Surgery  Why is it important to control my blood sugar before and after surgery? . Improving blood sugar levels before and after surgery helps healing and can limit problems. . A way of improving blood sugar control is eating a healthy diet by: o  Eating less sugar and carbohydrates o  Increasing activity/exercise o  Talking with your doctor about reaching your blood sugar goals . High blood sugars (greater than 180 mg/dL) can raise your risk of infections and slow your recovery, so you will need to focus on controlling your diabetes during the weeks before surgery. . Make sure that the doctor who takes care of your diabetes knows about your planned surgery including the date and location.  How do I manage my blood sugar before surgery? . Check your blood sugar  at least 4 times a day, starting 2 days before surgery, to make sure that the level is not too high or low. o Check your blood sugar the morning of your surgery when you wake up and every 2 hours until you get to the Short Stay unit. . If your blood sugar is less than 70 mg/dL, you will need to treat for low blood sugar: o Do not take insulin. o Treat a low blood sugar (less than 70 mg/dL) with  cup of clear juice (cranberry or apple), 4 glucose tablets, OR glucose gel. o Recheck blood sugar in 15 minutes after treatment (to make sure it is greater than 70 mg/dL). If your blood sugar is not greater than 70 mg/dL on recheck, call [phone] for further instructions. . Report your blood sugar to the short stay nurse when you get to Short Stay.  . If you are admitted to the hospital after surgery: o Your blood sugar will be checked by the staff and you will probably be given insulin after surgery (instead of oral diabetes medicines) to make sure you have good blood sugar levels. o The goal for blood sugar control after surgery is 80-180 mg/dL.  WHAT DO I DO ABOUT MY DIABETES MEDICATION?   Marland Kitchen Do not take oral diabetes medicines (pills) the morning of surgery such as Actos and glimepiride (AMARYL)   Patient Signature:  Date:   Nurse Signature:  Date:   Reviewed and Endorsed by [facility] Patient Education Committee, August 2015  Do not wear jewelry, make-up or nail polish.  Do  not wear lotions, powders, or perfumes, or deoderant.  Do not shave 48 hours prior to surgery.  Men may shave face and neck.  Do not bring valuables to the hospital.  [facility] is not responsible for any belongings or valuables.  Contacts, dentures or bridgework may not be worn into surgery.  Leave your suitcase in the car.  After surgery it may be brought to your room.  For patients admitted to the hospital, discharge time will be determined by your treatment team.  Patients discharged the day of surgery will  not be allowed to drive home.  Special instructions: Shower the night before surgery and the morning of surgery with CHG.  Please read over the following fact sheets that you were given. Pain Booklet, Coughing and Deep Breathing, Blood Transfusion Information and Surgical Site Infection Prevention

## 2016-10-06 NOTE — Progress Notes (Signed)
Pt denies any acute cardiopulmonary issues. Pt stated that he is under the care of Dr. Bronson Ing, Cardiology. Pt stated that an A1c was completed by PCP, Dr. Gerarda Fraction recently; records requested. Spoke with Varney Biles, Surgical Coordinator, to have MD clarify pre-op Plavix and Aspirin instructions. Varney Biles stated that she would send a message to the MD; awaiting a response.

## 2016-10-07 ENCOUNTER — Ambulatory Visit (HOSPITAL_COMMUNITY)
Admission: RE | Admit: 2016-10-07 | Discharge: 2016-10-07 | Disposition: A | Discharge status: Home / Self Care | Payer: Medicare Other | Source: Ambulatory Visit | Attending: Internal Medicine | Admitting: Internal Medicine

## 2016-10-07 ENCOUNTER — Inpatient Hospital Stay (HOSPITAL_COMMUNITY): Payer: Medicare Other | Admitting: Certified Registered"

## 2016-10-07 ENCOUNTER — Ambulatory Visit (HOSPITAL_COMMUNITY)
Admission: RE | Admit: 2016-10-07 | Discharge: 2016-10-08 | Disposition: A | Discharge status: Home / Self Care | Payer: Medicare Other | Source: Ambulatory Visit | Attending: Internal Medicine | Admitting: Internal Medicine

## 2016-10-07 ENCOUNTER — Encounter (HOSPITAL_COMMUNITY): Payer: Self-pay | Admitting: *Deleted

## 2016-10-07 ENCOUNTER — Encounter (HOSPITAL_COMMUNITY)
Admission: RE | Disposition: A | Discharge status: Home / Self Care | Payer: Self-pay | Source: Ambulatory Visit | Attending: Internal Medicine

## 2016-10-07 ENCOUNTER — Other Ambulatory Visit: Payer: Self-pay | Admitting: Internal Medicine

## 2016-10-07 DIAGNOSIS — Z79899 Other long term (current) drug therapy: Secondary | ICD-10-CM | POA: Insufficient documentation

## 2016-10-07 DIAGNOSIS — G4730 Sleep apnea, unspecified: Secondary | ICD-10-CM | POA: Diagnosis not present

## 2016-10-07 DIAGNOSIS — R7881 Bacteremia: Secondary | ICD-10-CM | POA: Diagnosis not present

## 2016-10-07 DIAGNOSIS — K219 Gastro-esophageal reflux disease without esophagitis: Secondary | ICD-10-CM | POA: Diagnosis not present

## 2016-10-07 DIAGNOSIS — Z7984 Long term (current) use of oral hypoglycemic drugs: Secondary | ICD-10-CM | POA: Diagnosis not present

## 2016-10-07 DIAGNOSIS — Z8249 Family history of ischemic heart disease and other diseases of the circulatory system: Secondary | ICD-10-CM | POA: Diagnosis not present

## 2016-10-07 DIAGNOSIS — M5136 Other intervertebral disc degeneration, lumbar region: Secondary | ICD-10-CM | POA: Diagnosis not present

## 2016-10-07 DIAGNOSIS — Z803 Family history of malignant neoplasm of breast: Secondary | ICD-10-CM | POA: Diagnosis not present

## 2016-10-07 DIAGNOSIS — X58XXXA Exposure to other specified factors, initial encounter: Secondary | ICD-10-CM | POA: Insufficient documentation

## 2016-10-07 DIAGNOSIS — I351 Nonrheumatic aortic (valve) insufficiency: Secondary | ICD-10-CM | POA: Diagnosis not present

## 2016-10-07 DIAGNOSIS — Z823 Family history of stroke: Secondary | ICD-10-CM | POA: Diagnosis not present

## 2016-10-07 DIAGNOSIS — Z833 Family history of diabetes mellitus: Secondary | ICD-10-CM | POA: Insufficient documentation

## 2016-10-07 DIAGNOSIS — N182 Chronic kidney disease, stage 2 (mild): Secondary | ICD-10-CM | POA: Insufficient documentation

## 2016-10-07 DIAGNOSIS — E1122 Type 2 diabetes mellitus with diabetic chronic kidney disease: Secondary | ICD-10-CM | POA: Diagnosis not present

## 2016-10-07 DIAGNOSIS — M109 Gout, unspecified: Secondary | ICD-10-CM | POA: Diagnosis not present

## 2016-10-07 DIAGNOSIS — Z841 Family history of disorders of kidney and ureter: Secondary | ICD-10-CM | POA: Diagnosis not present

## 2016-10-07 DIAGNOSIS — Z87891 Personal history of nicotine dependence: Secondary | ICD-10-CM | POA: Insufficient documentation

## 2016-10-07 DIAGNOSIS — Z9049 Acquired absence of other specified parts of digestive tract: Secondary | ICD-10-CM | POA: Insufficient documentation

## 2016-10-07 DIAGNOSIS — Z7982 Long term (current) use of aspirin: Secondary | ICD-10-CM | POA: Diagnosis not present

## 2016-10-07 DIAGNOSIS — T82519A Breakdown (mechanical) of unspecified cardiac and vascular devices and implants, initial encounter: Secondary | ICD-10-CM | POA: Diagnosis not present

## 2016-10-07 DIAGNOSIS — G4733 Obstructive sleep apnea (adult) (pediatric): Secondary | ICD-10-CM | POA: Diagnosis not present

## 2016-10-07 DIAGNOSIS — I252 Old myocardial infarction: Secondary | ICD-10-CM | POA: Diagnosis not present

## 2016-10-07 DIAGNOSIS — Z95810 Presence of automatic (implantable) cardiac defibrillator: Secondary | ICD-10-CM | POA: Insufficient documentation

## 2016-10-07 DIAGNOSIS — Z8673 Personal history of transient ischemic attack (TIA), and cerebral infarction without residual deficits: Secondary | ICD-10-CM | POA: Insufficient documentation

## 2016-10-07 DIAGNOSIS — T82110A Breakdown (mechanical) of cardiac electrode, initial encounter: Secondary | ICD-10-CM | POA: Diagnosis present

## 2016-10-07 DIAGNOSIS — I130 Hypertensive heart and chronic kidney disease with heart failure and stage 1 through stage 4 chronic kidney disease, or unspecified chronic kidney disease: Secondary | ICD-10-CM | POA: Insufficient documentation

## 2016-10-07 DIAGNOSIS — I509 Heart failure, unspecified: Secondary | ICD-10-CM | POA: Insufficient documentation

## 2016-10-07 DIAGNOSIS — T82518A Breakdown (mechanical) of other cardiac and vascular devices and implants, initial encounter: Secondary | ICD-10-CM | POA: Diagnosis not present

## 2016-10-07 DIAGNOSIS — Y939 Activity, unspecified: Secondary | ICD-10-CM | POA: Insufficient documentation

## 2016-10-07 DIAGNOSIS — F419 Anxiety disorder, unspecified: Secondary | ICD-10-CM | POA: Insufficient documentation

## 2016-10-07 HISTORY — PX: TEE WITHOUT CARDIOVERSION: SHX5443

## 2016-10-07 HISTORY — PX: ICD LEAD REMOVAL: SHX5855

## 2016-10-07 LAB — GLUCOSE, CAPILLARY
GLUCOSE-CAPILLARY: 85 mg/dL (ref 65–99)
GLUCOSE-CAPILLARY: 186 mg/dL — AB (ref 65–99)
GLUCOSE-CAPILLARY: 54 mg/dL — AB (ref 65–99)
Glucose-Capillary: 124 mg/dL — ABNORMAL HIGH (ref 65–99)
Glucose-Capillary: 44 mg/dL — CL (ref 65–99)
Glucose-Capillary: 61 mg/dL — ABNORMAL LOW (ref 65–99)
Glucose-Capillary: 43 mg/dL — CL (ref 65–99)
Glucose-Capillary: 160 mg/dL — ABNORMAL HIGH (ref 65–99)

## 2016-10-07 LAB — PREPARE RBC (CROSSMATCH)

## 2016-10-07 SURGERY — REMOVAL, ELECTRODE LEAD, ICD
Anesthesia: General | Site: Chest

## 2016-10-07 MED ORDER — SODIUM CHLORIDE 0.9 % IR SOLN
Status: AC
  Filled 2016-10-07: qty 2

## 2016-10-07 MED ORDER — ONDANSETRON HCL 4 MG/2ML IJ SOLN
INTRAMUSCULAR | Status: AC
  Filled 2016-10-07: qty 2

## 2016-10-07 MED ORDER — LISINOPRIL 5 MG PO TABS
5.0000 mg | ORAL_TABLET | Freq: Every day | ORAL | Status: DC
  Administered 2016-10-08: 5 mg via ORAL
  Filled 2016-10-07: qty 1

## 2016-10-07 MED ORDER — SODIUM CHLORIDE 0.9 % IR SOLN
Status: DC | PRN
  Administered 2016-10-07: 500 mL

## 2016-10-07 MED ORDER — FOLIC ACID-VIT B6-VIT B12 2.5-25-1 MG PO TABS
1.0000 | ORAL_TABLET | Freq: Two times a day (BID) | ORAL | Status: DC
  Filled 2016-10-07: qty 1

## 2016-10-07 MED ORDER — ROCURONIUM BROMIDE 50 MG/5ML IV SOSY
PREFILLED_SYRINGE | INTRAVENOUS | Status: AC
  Filled 2016-10-07: qty 5

## 2016-10-07 MED ORDER — CEFAZOLIN SODIUM-DEXTROSE 2-4 GM/100ML-% IV SOLN
INTRAVENOUS | Status: AC
  Filled 2016-10-07: qty 100

## 2016-10-07 MED ORDER — CALCIUM CARBONATE 1250 (500 CA) MG PO TABS
1.0000 | ORAL_TABLET | Freq: Every day | ORAL | Status: DC
  Administered 2016-10-08: 500 mg via ORAL
  Filled 2016-10-07: qty 1

## 2016-10-07 MED ORDER — DEXTROSE 50 % IV SOLN
12.5000 g | Freq: Once | INTRAVENOUS | Status: AC
Start: 2016-10-07 — End: 2016-10-07
  Administered 2016-10-07: 25 mL via INTRAVENOUS
  Administered 2016-10-07: 12.5 g via INTRAVENOUS
  Filled 2016-10-07: qty 50

## 2016-10-07 MED ORDER — COLCHICINE 0.6 MG PO TABS: 0.1200 mg | ORAL_TABLET | Freq: Two times a day (BID) | ORAL | Status: DC

## 2016-10-07 MED ORDER — LIDOCAINE HCL (PF) 1 % IJ SOLN
INTRAMUSCULAR | Status: AC
  Filled 2016-10-07: qty 30

## 2016-10-07 MED ORDER — SODIUM CHLORIDE 0.9 % IV SOLN: Freq: Once | INTRAVENOUS | Status: DC

## 2016-10-07 MED ORDER — EPHEDRINE SULFATE 50 MG/ML IJ SOLN
INTRAMUSCULAR | Status: DC | PRN
Start: 2016-10-07 — End: 2016-10-07
  Administered 2016-10-07: 10 mg via INTRAVENOUS

## 2016-10-07 MED ORDER — FENTANYL CITRATE (PF) 250 MCG/5ML IJ SOLN
INTRAMUSCULAR | Status: AC
  Filled 2016-10-07: qty 5

## 2016-10-07 MED ORDER — MIDAZOLAM HCL 2 MG/2ML IJ SOLN
INTRAMUSCULAR | Status: AC
  Filled 2016-10-07: qty 2

## 2016-10-07 MED ORDER — GABAPENTIN 300 MG PO CAPS
300.0000 mg | ORAL_CAPSULE | Freq: Three times a day (TID) | ORAL | Status: DC
  Administered 2016-10-07 – 2016-10-08 (×2): 300 mg via ORAL
  Filled 2016-10-07 (×2): qty 1

## 2016-10-07 MED ORDER — AMITRIPTYLINE HCL 50 MG PO TABS
50.0000 mg | ORAL_TABLET | Freq: Every day | ORAL | Status: DC
  Administered 2016-10-07: 50 mg via ORAL
  Filled 2016-10-07: qty 1

## 2016-10-07 MED ORDER — PHENYLEPHRINE HCL 10 MG/ML IJ SOLN
INTRAMUSCULAR | Status: DC | PRN
  Administered 2016-10-07: 80 ug via INTRAVENOUS
  Administered 2016-10-07: 120 ug via INTRAVENOUS
  Administered 2016-10-07: 80 ug via INTRAVENOUS

## 2016-10-07 MED ORDER — FUROSEMIDE 20 MG PO TABS
20.0000 mg | ORAL_TABLET | Freq: Every day | ORAL | Status: DC
  Administered 2016-10-08: 20 mg via ORAL
  Filled 2016-10-07: qty 1

## 2016-10-07 MED ORDER — EPHEDRINE 5 MG/ML INJ
INTRAVENOUS | Status: AC
  Filled 2016-10-07: qty 10

## 2016-10-07 MED ORDER — ONDANSETRON HCL 4 MG/2ML IJ SOLN
INTRAMUSCULAR | Status: DC | PRN
  Administered 2016-10-07: 4 mg via INTRAVENOUS

## 2016-10-07 MED ORDER — DEXTROSE 50 % IV SOLN
INTRAVENOUS | Status: AC
  Administered 2016-10-07: 25 mL via INTRAVENOUS
  Filled 2016-10-07: qty 50

## 2016-10-07 MED ORDER — ACETAMINOPHEN 325 MG PO TABS: 325.0000 mg | ORAL_TABLET | ORAL | Status: DC | PRN

## 2016-10-07 MED ORDER — PROPOFOL 10 MG/ML IV BOLUS
INTRAVENOUS | Status: AC
  Filled 2016-10-07: qty 20

## 2016-10-07 MED ORDER — ONDANSETRON HCL 4 MG/2ML IJ SOLN: 4.0000 mg | Freq: Four times a day (QID) | INTRAMUSCULAR | Status: DC | PRN

## 2016-10-07 MED ORDER — OXYCODONE HCL 5 MG PO TABS
30.0000 mg | ORAL_TABLET | Freq: Four times a day (QID) | ORAL | Status: DC | PRN
  Administered 2016-10-07: 30 mg via ORAL
  Filled 2016-10-07: qty 6

## 2016-10-07 MED ORDER — GLIMEPIRIDE 4 MG PO TABS
4.0000 mg | ORAL_TABLET | Freq: Every day | ORAL | Status: DC
  Administered 2016-10-08: 4 mg via ORAL
  Filled 2016-10-07: qty 1

## 2016-10-07 MED ORDER — CEFAZOLIN SODIUM-DEXTROSE 2-4 GM/100ML-% IV SOLN
2.0000 g | INTRAVENOUS | Status: AC
  Administered 2016-10-07: 2 g via INTRAVENOUS

## 2016-10-07 MED ORDER — PROPOFOL 10 MG/ML IV BOLUS
INTRAVENOUS | Status: DC | PRN
  Administered 2016-10-07: 100 mg via INTRAVENOUS

## 2016-10-07 MED ORDER — FENTANYL CITRATE (PF) 100 MCG/2ML IJ SOLN
INTRAMUSCULAR | Status: DC | PRN
  Administered 2016-10-07: 150 ug via INTRAVENOUS

## 2016-10-07 MED ORDER — SODIUM CHLORIDE 0.45 % IV SOLN: INTRAVENOUS | Status: DC

## 2016-10-07 MED ORDER — DEXTROSE 5 % IV SOLN
INTRAVENOUS | Status: DC | PRN
  Administered 2016-10-07: 25 ug/min via INTRAVENOUS

## 2016-10-07 MED ORDER — DEXTROSE 50 % IV SOLN
INTRAVENOUS | Status: AC
  Administered 2016-10-07: 50 mL via INTRAVENOUS
  Filled 2016-10-07: qty 50

## 2016-10-07 MED ORDER — CARVEDILOL 25 MG PO TABS
25.0000 mg | ORAL_TABLET | Freq: Two times a day (BID) | ORAL | Status: DC
  Administered 2016-10-07 – 2016-10-08 (×2): 25 mg via ORAL
  Filled 2016-10-07 (×2): qty 1

## 2016-10-07 MED ORDER — LIDOCAINE 2% (20 MG/ML) 5 ML SYRINGE
INTRAMUSCULAR | Status: AC
  Filled 2016-10-07: qty 5

## 2016-10-07 MED ORDER — LIDOCAINE HCL (PF) 1 % IJ SOLN
INTRAMUSCULAR | Status: DC | PRN
  Administered 2016-10-07: 20 mL

## 2016-10-07 MED ORDER — ONDANSETRON HCL 4 MG/2ML IJ SOLN: 4.0000 mg | Freq: Once | INTRAMUSCULAR | Status: DC | PRN

## 2016-10-07 MED ORDER — SUGAMMADEX SODIUM 200 MG/2ML IV SOLN
INTRAVENOUS | Status: AC
  Filled 2016-10-07: qty 2

## 2016-10-07 MED ORDER — ASPIRIN EC 81 MG PO TBEC: 81.0000 mg | DELAYED_RELEASE_TABLET | Freq: Every day | ORAL | Status: DC | PRN

## 2016-10-07 MED ORDER — CEFAZOLIN IN D5W 1 GM/50ML IV SOLN
1.0000 g | Freq: Four times a day (QID) | INTRAVENOUS | Status: AC
  Administered 2016-10-07 – 2016-10-08 (×3): 1 g via INTRAVENOUS
  Filled 2016-10-07 (×3): qty 50

## 2016-10-07 MED ORDER — HEPARIN SODIUM (PORCINE) 5000 UNIT/ML IJ SOLN
INTRAMUSCULAR | Status: DC | PRN
  Administered 2016-10-07: 500 mL

## 2016-10-07 MED ORDER — DEXTROSE 50 % IV SOLN
50.0000 mL | Freq: Once | INTRAVENOUS | Status: AC
  Administered 2016-10-07: 50 mL via INTRAVENOUS

## 2016-10-07 MED ORDER — PHENYLEPHRINE 40 MCG/ML (10ML) SYRINGE FOR IV PUSH (FOR BLOOD PRESSURE SUPPORT)
PREFILLED_SYRINGE | INTRAVENOUS | Status: AC
  Filled 2016-10-07: qty 10

## 2016-10-07 MED ORDER — MIDAZOLAM HCL 2 MG/2ML IJ SOLN
INTRAMUSCULAR | Status: DC | PRN
  Administered 2016-10-07: 2 mg via INTRAVENOUS

## 2016-10-07 MED ORDER — ATORVASTATIN CALCIUM 20 MG PO TABS
20.0000 mg | ORAL_TABLET | Freq: Every day | ORAL | Status: DC
  Administered 2016-10-08: 20 mg via ORAL
  Filled 2016-10-07: qty 1

## 2016-10-07 MED ORDER — LIDOCAINE HCL (CARDIAC) 20 MG/ML IV SOLN
INTRAVENOUS | Status: DC | PRN
  Administered 2016-10-07: 100 mg via INTRAVENOUS

## 2016-10-07 MED ORDER — LACTATED RINGERS IV SOLN
INTRAVENOUS | Status: DC
  Administered 2016-10-07 (×3): via INTRAVENOUS

## 2016-10-07 MED ORDER — CHLORHEXIDINE GLUCONATE 4 % EX LIQD: 60.0000 mL | Freq: Once | CUTANEOUS | Status: DC

## 2016-10-07 MED ORDER — ROCURONIUM BROMIDE 100 MG/10ML IV SOLN
INTRAVENOUS | Status: DC | PRN
  Administered 2016-10-07: 50 mg via INTRAVENOUS
  Administered 2016-10-07 (×2): 20 mg via INTRAVENOUS

## 2016-10-07 MED ORDER — PANTOPRAZOLE SODIUM 40 MG PO TBEC
40.0000 mg | DELAYED_RELEASE_TABLET | Freq: Every day | ORAL | Status: DC
  Administered 2016-10-08: 40 mg via ORAL
  Filled 2016-10-07: qty 1

## 2016-10-07 MED ORDER — CLOPIDOGREL BISULFATE 75 MG PO TABS
75.0000 mg | ORAL_TABLET | Freq: Every day | ORAL | Status: DC
  Administered 2016-10-08: 75 mg via ORAL
  Filled 2016-10-07: qty 1

## 2016-10-07 MED ORDER — SUGAMMADEX SODIUM 200 MG/2ML IV SOLN
INTRAVENOUS | Status: DC | PRN
  Administered 2016-10-07: 200 mg via INTRAVENOUS

## 2016-10-07 MED ORDER — HYDROMORPHONE HCL 1 MG/ML IJ SOLN: 0.2500 mg | INTRAMUSCULAR | Status: DC | PRN

## 2016-10-07 MED ORDER — FA-PYRIDOXINE-CYANOCOBALAMIN 2.5-25-2 MG PO TABS
1.0000 | ORAL_TABLET | Freq: Two times a day (BID) | ORAL | Status: DC
  Administered 2016-10-07 – 2016-10-08 (×2): 1 via ORAL
  Filled 2016-10-07 (×2): qty 1

## 2016-10-07 MED ORDER — MEPERIDINE HCL 25 MG/ML IJ SOLN: 6.2500 mg | INTRAMUSCULAR | Status: DC | PRN

## 2016-10-07 MED ORDER — LORAZEPAM 1 MG PO TABS
1.0000 mg | ORAL_TABLET | Freq: Three times a day (TID) | ORAL | Status: DC
  Administered 2016-10-07 – 2016-10-08 (×2): 1 mg via ORAL
  Filled 2016-10-07 (×2): qty 1

## 2016-10-07 MED ORDER — PHENYLEPHRINE HCL 10 MG/ML IJ SOLN
INTRAMUSCULAR | Status: DC | PRN
Start: 2016-10-07 — End: 2016-10-07

## 2016-10-07 SURGICAL SUPPLY — 65 items
BAG BANDED W/RUBBER/TAPE 36X54 (MISCELLANEOUS) IMPLANT
BAG DECANTER FOR FLEXI CONT (MISCELLANEOUS) IMPLANT
BLADE 10 SAFETY STRL DISP (BLADE) IMPLANT
BLADE CLIPPER SURG (BLADE) IMPLANT
BLADE OSCILLATING /SAGITTAL (BLADE) IMPLANT
BLADE STERNUM SYSTEM 6 (BLADE) ×4 IMPLANT
BNDG COHESIVE 4X5 WHT NS (GAUZE/BANDAGES/DRESSINGS) ×4 IMPLANT
CANISTER SUCT 3000ML PPV (MISCELLANEOUS) ×4 IMPLANT
CLOSURE STERI-STRIP 1/2X4 (GAUZE/BANDAGES/DRESSINGS) ×1
CLSR STERI-STRIP ANTIMIC 1/2X4 (GAUZE/BANDAGES/DRESSINGS) ×3 IMPLANT
COIL ONE TIE COMPRESSION (MISCELLANEOUS) ×4 IMPLANT
COVER BACK TABLE 60X90IN (DRAPES) ×4 IMPLANT
COVER DOME SNAP 22 D (MISCELLANEOUS) ×4 IMPLANT
COVER PROBE W GEL 5X96 (DRAPES) ×4 IMPLANT
COVER SURGICAL LIGHT HANDLE (MISCELLANEOUS) ×4 IMPLANT
DERMABOND ADHESIVE PROPEN (GAUZE/BANDAGES/DRESSINGS) ×2
DERMABOND ADVANCED .7 DNX6 (GAUZE/BANDAGES/DRESSINGS) ×2 IMPLANT
DRAPE C-ARM 42X72 X-RAY (DRAPES) IMPLANT
DRAPE CARDIOVASCULAR INCISE (DRAPES) ×2
DRAPE INCISE IOBAN 66X45 STRL (DRAPES) IMPLANT
DRAPE PROXIMA HALF (DRAPES) ×8 IMPLANT
DRAPE SRG 135X102X78XABS (DRAPES) ×2 IMPLANT
DRSG TEGADERM 4X4.75 (GAUZE/BANDAGES/DRESSINGS) ×8 IMPLANT
ELECT REM PT RETURN 9FT ADLT (ELECTROSURGICAL) ×8
ELECTRODE REM PT RTRN 9FT ADLT (ELECTROSURGICAL) ×4 IMPLANT
FELT TEFLON 1X6 (MISCELLANEOUS) ×4 IMPLANT
GAUZE SPONGE 4X4 12PLY STRL (GAUZE/BANDAGES/DRESSINGS) ×4 IMPLANT
GAUZE SPONGE 4X4 16PLY XRAY LF (GAUZE/BANDAGES/DRESSINGS) ×4 IMPLANT
GLOVE BIOGEL PI IND STRL 6.5 (GLOVE) ×4 IMPLANT
GLOVE BIOGEL PI IND STRL 7.0 (GLOVE) ×2 IMPLANT
GLOVE BIOGEL PI IND STRL 7.5 (GLOVE) ×2 IMPLANT
GLOVE BIOGEL PI INDICATOR 6.5 (GLOVE) ×4
GLOVE BIOGEL PI INDICATOR 7.0 (GLOVE) ×2
GLOVE BIOGEL PI INDICATOR 7.5 (GLOVE) ×2
GLOVE ECLIPSE 7.5 STRL STRAW (GLOVE) ×4 IMPLANT
GLOVE ECLIPSE 8.0 STRL XLNG CF (GLOVE) ×4 IMPLANT
GOWN STRL REUS W/ TWL LRG LVL3 (GOWN DISPOSABLE) ×2 IMPLANT
GOWN STRL REUS W/ TWL XL LVL3 (GOWN DISPOSABLE) ×2 IMPLANT
GOWN STRL REUS W/TWL LRG LVL3 (GOWN DISPOSABLE) ×2
GOWN STRL REUS W/TWL XL LVL3 (GOWN DISPOSABLE) ×2
KIT ROOM TURNOVER OR (KITS) ×4 IMPLANT
NEEDLE PERC 18GX7CM (NEEDLE) ×4 IMPLANT
NS IRRIG 1000ML POUR BTL (IV SOLUTION) IMPLANT
PAD ARMBOARD 7.5X6 YLW CONV (MISCELLANEOUS) ×8 IMPLANT
PAD ELECT DEFIB RADIOL ZOLL (MISCELLANEOUS) ×4 IMPLANT
SHEATH EVOLUTION RL 11F (SHEATH) ×4 IMPLANT
SHEATH EVOLUTION SHORTE RL 11F (SHEATH) ×4 IMPLANT
SHEATH PINNACLE 6F 10CM (SHEATH) ×4 IMPLANT
SHEATH STEADY STABILIZING 11F (SHEATH) ×4 IMPLANT
SPONGE GAUZE 4X4 12PLY STER LF (GAUZE/BANDAGES/DRESSINGS) ×8 IMPLANT
STYLET LIBERATOR LOCKING (MISCELLANEOUS) ×4 IMPLANT
SUT PROLENE 2 0 CT2 30 (SUTURE) IMPLANT
SUT PROLENE 2 0 SH DA (SUTURE) IMPLANT
SUT SILK 1 TIES 10X30 (SUTURE) ×4 IMPLANT
SUT VIC AB 2-0 CT1 27 (SUTURE) ×2
SUT VIC AB 2-0 CT1 TAPERPNT 27 (SUTURE) ×2 IMPLANT
SUT VIC AB 2-0 CT2 18 VCP726D (SUTURE) IMPLANT
SUT VIC AB 3-0 X1 27 (SUTURE) ×4 IMPLANT
TOWEL OR 17X24 6PK STRL BLUE (TOWEL DISPOSABLE) ×8 IMPLANT
TOWEL OR 17X26 10 PK STRL BLUE (TOWEL DISPOSABLE) ×8 IMPLANT
TRAY FOLEY W/METER SILVER 16FR (SET/KITS/TRAYS/PACK) ×4 IMPLANT
TUBE CONNECTING 12'X1/4 (SUCTIONS)
TUBE CONNECTING 12X1/4 (SUCTIONS) IMPLANT
TUBING EXTENTION W/L.L. (IV SETS) ×4 IMPLANT
YANKAUER SUCT BULB TIP NO VENT (SUCTIONS) IMPLANT

## 2016-10-07 NOTE — Plan of Care (Signed)
Problem: Respiratory: Goal: Levels of oxygenation will improve Outcome: Progressing Oxygen at 100%

## 2016-10-07 NOTE — Interval H&P Note (Signed)
History and Physical Interval Note:  10/07/2016 3:02 PM  Mike Floyd  has presented today for surgery, with the diagnosis of Normalization of LV function  The various methods of treatment have been discussed with the patient and family. After consideration of risks, benefits and other options for treatment, the patient has consented to  Procedure(s): ICD LEAD REMOVAL, possible insertion of new ICD (N/A) TRANSESOPHAGEAL ECHOCARDIOGRAM (TEE) (N/A) as a surgical intervention .  The patient's history has been reviewed, patient examined, no change in status, stable for surgery.  I have reviewed the patient's chart and labs.  Questions were answered to the patient's satisfaction.     Cristopher Peru

## 2016-10-07 NOTE — Progress Notes (Signed)
Patient blood sugar dropped to 54 asymptomatic. S/P ICD removal.  Ate and will recheck.

## 2016-10-07 NOTE — Progress Notes (Signed)
Blood sugar rechecked 160. Will continue to monitor through the night.

## 2016-10-07 NOTE — Progress Notes (Signed)
Hypoglycemic Event  CBG: 43  Treatment: D50 IV 50 mL  Symptoms: Shaky  Follow-up CBG: Time:1800 CBG Result:186  Possible Reasons for Event: Inadequate meal intake  Comments/MD notified:Dr Beatris Ship

## 2016-10-07 NOTE — Anesthesia Postprocedure Evaluation (Signed)
Anesthesia Post Note  Patient: Mike Floyd  Procedure(s) Performed: Procedure(s) (LRB): ICD LEAD REMOVAL  (N/A) TRANSESOPHAGEAL ECHOCARDIOGRAM (TEE) (N/A)  Patient location during evaluation: PACU Anesthesia Type: General Level of consciousness: awake and alert Pain management: pain level controlled Vital Signs Assessment: post-procedure vital signs reviewed and stable Respiratory status: spontaneous breathing, nonlabored ventilation, respiratory function stable and patient connected to nasal cannula oxygen Cardiovascular status: blood pressure returned to baseline and stable Postop Assessment: no signs of nausea or vomiting Anesthetic complications: no       Last Vitals:  Vitals:   10/07/16 1935 10/07/16 2014  BP: 101/77 109/73  Pulse: 96 90  Resp: 17 16  Temp: 36.3 C 36.8 C    Last Pain:  Vitals:   10/07/16 2014  TempSrc: Oral                 Britani Beattie DAVID

## 2016-10-07 NOTE — Anesthesia Procedure Notes (Signed)
Procedure Name: Intubation Date/Time: 10/07/2016 3:20 PM Performed by: Sampson Si E Pre-anesthesia Checklist: Patient identified, Emergency Drugs available, Suction available and Patient being monitored Patient Re-evaluated:Patient Re-evaluated prior to inductionOxygen Delivery Method: Circle System Utilized Preoxygenation: Pre-oxygenation with 100% oxygen Intubation Type: IV induction Ventilation: Mask ventilation without difficulty and Oral airway inserted - appropriate to patient size Laryngoscope Size: Mac and 3 Grade View: Grade I Tube type: Oral Tube size: 8.0 mm Number of attempts: 1 Airway Equipment and Method: Stylet and Oral airway Placement Confirmation: ETT inserted through vocal cords under direct vision,  positive ETCO2 and breath sounds checked- equal and bilateral Secured at: 22 cm Tube secured with: Tape Dental Injury: Teeth and Oropharynx as per pre-operative assessment

## 2016-10-07 NOTE — Transfer of Care (Signed)
Immediate Anesthesia Transfer of Care Note  Patient: Mike Floyd  Procedure(s) Performed: Procedure(s): ICD LEAD REMOVAL  (N/A) TRANSESOPHAGEAL ECHOCARDIOGRAM (TEE) (N/A)  Patient Location: PACU  Anesthesia Type:General  Level of Consciousness: awake and oriented  Airway & Oxygen Therapy: Patient Spontanous Breathing and Patient connected to nasal cannula oxygen  Post-op Assessment: Report given to RN  Post vital signs: Reviewed and stable  Last Vitals:  Vitals:   10/07/16 1220 10/07/16 1236  Pulse:  (P) 87  Resp:  (P) 20  Temp: 36.8 C     Last Pain:  Vitals:   10/07/16 1220  TempSrc: Oral      Patients Stated Pain Goal: 3 ([id number])  Complications: No apparent anesthesia complications

## 2016-10-07 NOTE — Progress Notes (Signed)
Patient arrived from OR post ICD removal. Site clean. Vitals stable. Denies pain at this time. CCMD called .

## 2016-10-07 NOTE — Op Note (Signed)
EP procedure note  Procedure performed: ICD system extraction  Preoperative diagnosis: Broken ICD lead in a patient who is status post initial ICD insertion who's left ventricular function had normalized  Postoperative diagnosis: Same as preoperative diagnosis  Description of the procedure: After informed consent was obtained, the patient was taken to the operating room in the fasting state. Invasive hemodynamic monitoring was applied by the anesthesia service. General endotracheal anesthesia was applied by the anesthesia service. A transesophageal echo was placed by the anesthesia service. After the usual preparation and draping, a 6 French sheath was inserted percutaneously in the right femoral vein. This was used for additional venous access. Attention was then turned to the ICD system. 30 cc of lidocaine was infiltrated over the old ICD insertion site. A 6 cm incision was carried out. Electrocautery was utilized to dissect down to the ICD pocket. The generator was removed with gentle traction. Electrocautery was utilized to free up the ICD lead. A stylette was inserted into the lead and the helix retracted. The lead was cut and the [facility] locking stylette was inserted into the defibrillation lead and locked in place. A one tie suture was utilized to secure the proximal electrode. A Cook Liberator locking stylette was inserted into the lead and the distal portion of the lead locked in place. A Cook RL short 80 French sheath was then advanced over the lead to the junction of the innominate and subclavian vein. There is very dense fibrous scar tissue present. Next, the [facility] [place] long sheath and a tissue stabilization sheath were advanced over the lead down to the distal coil. There is very dense scar tissue here we were able to transverse approximately half of the distal coil but had difficulty making additional progress. I requested the presence of a cardiac surgeon in the room at this point before we  proceeded. We then proceeded and advanced the long 87 French sheath over the lead and the tip of the lead was freed up and the lead was removed in total. There was transient hypotension during the procedure. The transesophageal echo demonstrated no pericardial effusion. There is no pleural effusion as well. At this point pressure was held and hemostasis obtained. The blood pressure stabilized. The pocket was irrigated. The incision was closed with Vicryl suture. The right femoral sheath was removed. A pressure dressing was applied, and the patient was returned to the recovery area in stable condition.  Complications: There were no immediate procedure complications  Conclusion: Successful extraction of a of 56 year old transvenous defibrillation lead and removal of a previously implanted single-chamber ICD generator. Because the patient's left ventricular function has normalized, it is anticipated that he will not receive another defibrillator at this time.  Cristopher [place], M.D.

## 2016-10-07 NOTE — H&P (View-Only) (Signed)
HPI Mike Floyd returns today for followup. He is a very pleasant 56 year old man with a nonischemic cardiomyopathy, chronic congestive heart failure, status post ICD implantation. The patient has reached ERI on his device and was to come in and have a generator change out when he was found to have a new increase in his pacing impedence and also c/o severe pain and warmth around his device. He returns today to discuss system extraction. He has been feeling very weak and lightheaded.   .No Known Allergies   Current Outpatient Prescriptions  Medication Sig Dispense Refill  . amitriptyline (ELAVIL) 50 MG tablet Take 50 mg by mouth at bedtime.     Marland Kitchen aspirin 81 MG tablet Take 81 mg by mouth daily as needed for pain.    Marland Kitchen atorvastatin (LIPITOR) 20 MG tablet Take 20 mg by mouth daily.    Marland Kitchen CALCIUM PO Take 1 tablet by mouth daily.    . carvedilol (COREG) 25 MG tablet TAKE 2 TABLETS BY MOUTH TWICE DAILY. (Patient taking differently: Take 50 mg by mouth 2 (two) times daily with a meal. TAKE 2 TABLETS BY MOUTH TWICE DAILY.) 360 tablet 0  . clopidogrel (PLAVIX) 75 MG tablet Take 75 mg by mouth daily.    . colchicine 0.6 MG tablet Take 0.12 mg by mouth 2 (two) times daily.     . furosemide (LASIX) 20 MG tablet Take 1 tablet (20 mg total) by mouth daily. 90 tablet 0  . gabapentin (NEURONTIN) 300 MG capsule Take 300 mg by mouth 3 (three) times daily.     Marland Kitchen glimepiride (AMARYL) 4 MG tablet Take 4 mg by mouth daily before breakfast.    . L-Methylfolate-B6-B12 (FOLTANX) 3-35-2 MG TABS Take 1 tablet by mouth 2 (two) times daily.    Marland Kitchen lisinopril (PRINIVIL,ZESTRIL) 5 MG tablet Take 1 tablet (5 mg total) by mouth daily. 90 tablet 0  . LORazepam (ATIVAN) 1 MG tablet Take 1 mg by mouth 3 (three) times daily.     . metoCLOPramide (REGLAN) 10 MG tablet TAKE 1 TABLET BY MOUTH 30 MINUTES BEFORE BREAKFAST AND EVENING MEAL DAILY. 60 tablet 2  . omeprazole (PRILOSEC) 40 MG capsule TAKE (1) CAPSULE BY MOUTH ONCE DAILY. 30  capsule 6  . oxycodone (ROXICODONE) 30 MG immediate release tablet Take 1-2 tablets by mouth 4 (four) times daily as needed for pain.     Marland Kitchen sucralfate (CARAFATE) 1 g tablet TAKE 1 TABLET BY MOUTH 30 MINTUES BEFORE MEALS AND AT BEDTIME. (Patient taking differently: TAKE 1 TABLET BY MOUTH 30 MINTUES BEFORE EACH MEALS AND AT BEDTIME.) 120 tablet 5   No current facility-administered medications for this visit.      Past Medical History:  Diagnosis Date  . AICD (automatic cardioverter/defibrillator) present 11/2007   a. 11/2007 [facility] Current VR - single lead ICD  . Anxiety   . Chest pain    a. 10/2007 Cath:  normal Cors.  . CKD (chronic kidney disease), stage II   . DDD (degenerative disc disease), lumbar   . Diabetes mellitus DX: 2010  . Erosive esophagitis    a. per EGD (08/2011), Dr. Laural Golden - Erosive reflux esophagitis improved but not completely healed since previous EGD 3 years ago. Bx showing  ulcerated gatroesophageal junction mucosa. negative for H. pylori  . GERD (gastroesophageal reflux disease)   . Gout   . Hearing deficit    a. wear bilateral hearing aides  . Hypertension   . Myocardial infarction 2011  . Nonischemic dilated  cardiomyopathy ([place])    a. H/O EF as low as 35-40% by LV gram 10/2007;  b. Echo 02/2011 EF 50-55%, inf HK, Gr 1 DD   . Renal insufficiency   . Sleep apnea    pt doesnt use, states"I cant afford one". PCP aware  . Stroke ([place])    mini-stroke in 2014  . TIA (transient ischemic attack)    July, 2013  . Tobacco abuse, in remission 06/27/2009   Discontinued in 2009    . WPW (Wolff-Parkinson-White syndrome)    a. s/p RFCA @ [facility]:   All systems reviewed and negative except as noted in the HPI.   Past Surgical History:  Procedure Laterality Date  . APPENDECTOMY    . BACK SURGERY     1995  . CARDIAC DEFIBRILLATOR PLACEMENT    . CHOLECYSTECTOMY    . CHOLECYSTECTOMY, LAPAROSCOPIC     11/2007  . COLONOSCOPY W/ POLYPECTOMY  2009  . ELBOW  SURGERY Left 06/2010  . ESOPHAGEAL DILATION N/A 11/08/2014   Procedure: ESOPHAGEAL DILATION;  Surgeon: Rogene Houston, MD;  Location: [facility];  Service: Endoscopy;  Laterality: N/A;  #56,   . ESOPHAGOGASTRODUODENOSCOPY  03/31/2012   also 08/2011; [name]  . ESOPHAGOGASTRODUODENOSCOPY (EGD) WITH PROPOFOL N/A 11/08/2014   Procedure: ESOPHAGOGASTRODUODENOSCOPY (EGD) WITH PROPOFOL;  Surgeon: Rogene Houston, MD;  Location: [facility];  Service: Endoscopy;  Laterality: N/A;  [place] is 37 , [place] is 35  . [place]   WPW; performed at [facility] History  Problem Relation Age of Onset  . Heart attack Mother 62  . Hypertension Mother   . Diabetes Mother   . Kidney disease Mother   . Breast cancer Mother   . Heart attack Father 37  . Hypertension Father   . Diabetes Father   . Stroke Brother   . Heart attack Brother 69  . Stroke Maternal Grandmother 80  . Diabetes Brother   . Hypertension Brother      Social History   Social History  . Marital status: Married    Spouse name: N/A  . Number of children: N/A  . Years of education: N/A   Occupational History  . Worked for [employer] off in 11/11   Social History Main Topics  . Smoking status: Former Smoker    Packs/day: 1.50    Years: 35.00    Types: Cigarettes    Start date: 06/30/1971    Quit date: 07/30/2010  . Smokeless tobacco: Former Systems developer    Types: Snuff    Quit date: 03/26/1994     Comment: currently uses a vape 11-06-2014.  Marland Kitchen Alcohol use No     Comment: used "years ago"  . Drug use: No  . Sexual activity: No   Other Topics Concern  . Not on file   Social History Narrative  . No narrative on file     BP (!) 84/60   Pulse 88   Ht 6\' 1"  (1.854 m)   Wt 229 lb (103.9 kg)   SpO2 99%   BMI 30.21 kg/m   Physical Exam:  Well appearing middle-aged man, NAD HEENT: Unremarkable Neck:  7 cm JVD, no thyromegally Back:  No CVA tenderness Lungs:  Clear except for  scattered basilar rales, no wheezes, no rhonchi. HEART:  Regular rate rhythm, no murmurs, no rubs, no clicks Abd:  soft, positive bowel sounds, no organomegally,  no rebound, no guarding Ext:  2 plus pulses, no edema, no cyanosis, no clubbing Skin:  No rashes no nodules Neuro:  CN II through XII intact, motor grossly intact  DEVICE  Normal device function.  See PaceArt for details.   Assess/Plan: 1. ICD lead with an elevated impedence - we have discussed the treatment options in detail. He will undergo ICD system removal. Will not plan to place a new device as had normalization of his LV function. 2. Chronic systolic heart failure - as above. His EF has normalized. He c/o lightheadedness and his BP is low. We will ask him to reduce his dose of coreg to 25 bid. 3. ICD - his device is at [facility]. Will remove the system but no plan to place a new device.  Mikle Bosworth.D.

## 2016-10-07 NOTE — Anesthesia Preprocedure Evaluation (Addendum)
Anesthesia Evaluation  Patient identified by MRN, date of birth, ID band Patient awake    Reviewed: Allergy & Precautions, NPO status , Patient's Chart, lab work & pertinent test results  Airway Mallampati: I  TM Distance: >3 FB Neck ROM: Full    Dental  (+) Edentulous Upper, Edentulous Lower, Dental Advisory Given   Pulmonary sleep apnea , former smoker,    Pulmonary exam normal        Cardiovascular hypertension, + Past MI  Normal cardiovascular exam+ Cardiac Defibrillator      Neuro/Psych Anxiety CVA    GI/Hepatic GERD  Medicated and Controlled,  Endo/Other  diabetes  Renal/GU Renal InsufficiencyRenal disease     Musculoskeletal   Abdominal   Peds  Hematology   Anesthesia Other Findings   Reproductive/Obstetrics                            Anesthesia Physical Anesthesia Plan  ASA: III  Anesthesia Plan: General   Post-op Pain Management:    Induction: Intravenous  Airway Management Planned: Oral ETT  Additional Equipment: Arterial line  Intra-op Plan:   Post-operative Plan: Extubation in OR  Informed Consent: I have reviewed the patients History and Physical, chart, labs and discussed the procedure including the risks, benefits and alternatives for the proposed anesthesia with the patient or authorized representative who has indicated his/her understanding and acceptance.     Plan Discussed with: CRNA and Surgeon  Anesthesia Plan Comments:         Anesthesia Quick Evaluation

## 2016-10-08 ENCOUNTER — Ambulatory Visit (HOSPITAL_COMMUNITY): Payer: Medicare Other

## 2016-10-08 ENCOUNTER — Encounter (HOSPITAL_COMMUNITY): Payer: Self-pay | Admitting: Internal Medicine

## 2016-10-08 DIAGNOSIS — Z803 Family history of malignant neoplasm of breast: Secondary | ICD-10-CM | POA: Diagnosis not present

## 2016-10-08 DIAGNOSIS — I252 Old myocardial infarction: Secondary | ICD-10-CM | POA: Diagnosis not present

## 2016-10-08 DIAGNOSIS — T82518A Breakdown (mechanical) of other cardiac and vascular devices and implants, initial encounter: Secondary | ICD-10-CM | POA: Diagnosis not present

## 2016-10-08 DIAGNOSIS — Z7982 Long term (current) use of aspirin: Secondary | ICD-10-CM | POA: Diagnosis not present

## 2016-10-08 DIAGNOSIS — Z9049 Acquired absence of other specified parts of digestive tract: Secondary | ICD-10-CM | POA: Diagnosis not present

## 2016-10-08 DIAGNOSIS — K219 Gastro-esophageal reflux disease without esophagitis: Secondary | ICD-10-CM | POA: Diagnosis not present

## 2016-10-08 DIAGNOSIS — M5136 Other intervertebral disc degeneration, lumbar region: Secondary | ICD-10-CM | POA: Diagnosis not present

## 2016-10-08 DIAGNOSIS — Z87891 Personal history of nicotine dependence: Secondary | ICD-10-CM | POA: Diagnosis not present

## 2016-10-08 DIAGNOSIS — Z841 Family history of disorders of kidney and ureter: Secondary | ICD-10-CM | POA: Diagnosis not present

## 2016-10-08 DIAGNOSIS — I130 Hypertensive heart and chronic kidney disease with heart failure and stage 1 through stage 4 chronic kidney disease, or unspecified chronic kidney disease: Secondary | ICD-10-CM | POA: Diagnosis not present

## 2016-10-08 DIAGNOSIS — Z833 Family history of diabetes mellitus: Secondary | ICD-10-CM | POA: Diagnosis not present

## 2016-10-08 DIAGNOSIS — Z79899 Other long term (current) drug therapy: Secondary | ICD-10-CM | POA: Diagnosis not present

## 2016-10-08 DIAGNOSIS — Z8249 Family history of ischemic heart disease and other diseases of the circulatory system: Secondary | ICD-10-CM | POA: Diagnosis not present

## 2016-10-08 DIAGNOSIS — N182 Chronic kidney disease, stage 2 (mild): Secondary | ICD-10-CM | POA: Diagnosis not present

## 2016-10-08 DIAGNOSIS — Z823 Family history of stroke: Secondary | ICD-10-CM | POA: Diagnosis not present

## 2016-10-08 DIAGNOSIS — Z7984 Long term (current) use of oral hypoglycemic drugs: Secondary | ICD-10-CM | POA: Diagnosis not present

## 2016-10-08 DIAGNOSIS — Z95810 Presence of automatic (implantable) cardiac defibrillator: Secondary | ICD-10-CM | POA: Diagnosis not present

## 2016-10-08 DIAGNOSIS — R0602 Shortness of breath: Secondary | ICD-10-CM | POA: Diagnosis not present

## 2016-10-08 DIAGNOSIS — E1122 Type 2 diabetes mellitus with diabetic chronic kidney disease: Secondary | ICD-10-CM | POA: Diagnosis not present

## 2016-10-08 DIAGNOSIS — G4730 Sleep apnea, unspecified: Secondary | ICD-10-CM | POA: Diagnosis not present

## 2016-10-08 DIAGNOSIS — I509 Heart failure, unspecified: Secondary | ICD-10-CM | POA: Diagnosis not present

## 2016-10-08 DIAGNOSIS — Z8673 Personal history of transient ischemic attack (TIA), and cerebral infarction without residual deficits: Secondary | ICD-10-CM | POA: Diagnosis not present

## 2016-10-08 DIAGNOSIS — T82110A Breakdown (mechanical) of cardiac electrode, initial encounter: Secondary | ICD-10-CM

## 2016-10-08 LAB — TYPE AND SCREEN
ABO/RH(D): O POS
ANTIBODY SCREEN: NEGATIVE
UNIT DIVISION: 0
Unit division: 0

## 2016-10-08 LAB — GLUCOSE, CAPILLARY
GLUCOSE-CAPILLARY: 151 mg/dL — AB (ref 65–99)
GLUCOSE-CAPILLARY: 146 mg/dL — AB (ref 65–99)
Glucose-Capillary: 154 mg/dL — ABNORMAL HIGH (ref 65–99)
Glucose-Capillary: 93 mg/dL (ref 65–99)

## 2016-10-08 LAB — BPAM RBC
BLOOD PRODUCT EXPIRATION DATE: 201803272359
BLOOD PRODUCT EXPIRATION DATE: 201803272359
ISSUE DATE / TIME: 201803011216
ISSUE DATE / TIME: 201803011216
Unit Type and Rh: 5100
Unit Type and Rh: 5100

## 2016-10-08 NOTE — H&P (Signed)
ICD Criteria  Current LVEF:55%. Within 12 months prior to implant: Yes   Heart failure history: Yes, Class II  Cardiomyopathy history: Yes, Ischemic Cardiomyopathy - Prior MI.  Atrial Fibrillation/Atrial Flutter: No.  Ventricular tachycardia history: No.  Cardiac arrest history: No.  History of syndromes with risk of sudden death: No.  Previous ICD: Yes, Reason for ICD:  Primary prevention.  Current ICD indication: Primary  PPM indication: No.   Class I or II Bradycardia indication present: No  Beta Blocker therapy for 3 or more months: Yes, prescribed.   Ace Inhibitor/ARB therapy for 3 or more months: Yes, prescribed.

## 2016-10-08 NOTE — Discharge Summary (Signed)
ELECTROPHYSIOLOGY PROCEDURE DISCHARGE SUMMARY    Patient ID: Mike Floyd,  MRN: [id number], DOB/AGE: 1961-05-19 56 y.o.  Admit date: 10/07/2016 Discharge date: 10/08/2016  Primary Care Physician: Glo Herring., MD  Cardiology/Electrophysiologist: Dr. Lovena Le  Primary Discharge Diagnosis:  1. NICM, with improved LVEF 2. ICD at [facility]  Secondary Discharge Diagnosis:  1. CRI (stage II) 2. DM 3. CBP 4. HTN  No Known Allergies   Procedures This Admission:  1.  ICD extraction 10/07/16, Dr. Lovena Le Complications: There were no immediate procedure complications Conclusion: Successful extraction of a of 56 year old transvenous defibrillation lead and removal of a previously implanted single-chamber ICD generator. Because the patient's left ventricular function has normalized, it is anticipated that he will not receive another defibrillator at this time.  Brief HPI: Mike Floyd is a 56 y.o. male was referred to electrophysiology in the outpatient setting for consideration of ICD extraction.  Past medical history includes NICM with ICD that had reached ERI, given improvement/normalization of his LVEF, decision was made to extract the device rather then replace.   Risks, benefits, and alternatives to ICD extraction were reviewed with the patient who wished to proceed.   Hospital Course:  The patient was admitted and underwent ICD system extraction with details as outlined above. He was monitored on telemetry overnight which demonstrated SR.  Left chest was without hematoma or ecchymosis.   Wound care, arm mobility, and restrictions were reviewed with the patient.  The patient was examined by Dr. Lovena Le and considered stable for discharge to home.     Physical Exam: Vitals:   10/07/16 1920 10/07/16 1935 10/07/16 2014 10/08/16 0447  BP: 106/75 101/77 109/73 96/64  Pulse: 93 96 90 90  Resp: 14 17 16 18   Temp:  97.3 F (36.3 C) 98.3 F (36.8 C) 98.8 F (37.1 C)  TempSrc:   Oral  Oral  SpO2: 100% 100% 100% 98%  Weight:   231 lb (104.8 kg)   Height:   6\' 1"  (1.854 m)     GEN- The patient is well appearing, alert and oriented x 3 today.   HEENT: normocephalic, atraumatic; sclera clear, conjunctiva pink; hearing intact; oropharynx clear Lungs- CTA b/l, normal work of breathing.  No wheezes, rales, rhonchi Heart- RRR, no murmurs, rubs or gallops, PMI not laterally displaced GI- soft, non-tender, non-distended Extremities- no clubbing, cyanosis, or edema MS- no significant deformity or atrophy Skin- warm and dry, no rash or lesion, left chest without hematoma/ecchymosis Psych- euthymic mood, full affect Neuro- no gross defecits  Labs:   Lab Results  Component Value Date   WBC 6.5 10/06/2016   HGB 15.4 10/06/2016   HCT 43.7 10/06/2016   MCV 89.7 10/06/2016   PLT 121 (L) 10/06/2016    Recent Labs Lab 10/06/16 1212  NA 136  K 4.3  CL 100*  CO2 31  BUN 13  CREATININE 1.56*  CALCIUM 9.6  GLUCOSE 115*    Discharge Medications:  Allergies as of 10/08/2016   No Known Allergies     Medication List    TAKE these medications   amitriptyline 50 MG tablet Commonly known as:  ELAVIL Take 50 mg by mouth at bedtime.   aspirin 81 MG tablet Take 81 mg by mouth daily as needed for pain.   atorvastatin 20 MG tablet Commonly known as:  LIPITOR Take 20 mg by mouth daily.   CALCIUM PO Take 1 tablet by mouth daily.   carvedilol 25 MG tablet Commonly known as:  COREG Take 1 tablet (25 mg total) by mouth 2 (two) times daily.   clopidogrel 75 MG tablet Commonly known as:  PLAVIX Take 75 mg by mouth daily.   colchicine 0.6 MG tablet Take 0.12 mg by mouth 2 (two) times daily.   FOLTANX 3-35-2 MG Tabs Take 1 tablet by mouth 2 (two) times daily.   furosemide 20 MG tablet Commonly known as:  LASIX Take 1 tablet (20 mg total) by mouth daily.   gabapentin 300 MG capsule Commonly known as:  NEURONTIN Take 300 mg by mouth 3 (three) times daily.     glimepiride 4 MG tablet Commonly known as:  AMARYL Take 4 mg by mouth daily before breakfast.   lisinopril 5 MG tablet Commonly known as:  PRINIVIL,ZESTRIL Take 1 tablet (5 mg total) by mouth daily.   LORazepam 1 MG tablet Commonly known as:  ATIVAN Take 1 mg by mouth 3 (three) times daily.   metoCLOPramide 10 MG tablet Commonly known as:  REGLAN TAKE 1 TABLET BY MOUTH 30 MINUTES BEFORE BREAKFAST AND EVENING MEAL DAILY.   omeprazole 40 MG capsule Commonly known as:  PRILOSEC TAKE (1) CAPSULE BY MOUTH ONCE DAILY.   oxycodone 30 MG immediate release tablet Commonly known as:  ROXICODONE Take 1-2 tablets by mouth 4 (four) times daily as needed for pain.   sucralfate 1 g tablet Commonly known as:  CARAFATE TAKE 1 TABLET BY MOUTH 30 MINTUES BEFORE MEALS AND AT BEDTIME. What changed:  See the new instructions.       Disposition: Home  Discharge Instructions    Diet - low sodium heart healthy    Complete by:  As directed    Increase activity slowly    Complete by:  As directed      Follow-up Information    [facility] Follow up on 10/20/2016.   Specialty:  Cardiology Why:  9:30AM, wound check Contact information: [address], [address]       Cristopher [place], MD Follow up on 01/13/2017.   Specialty:  Cardiology Why:  10:15AM Contact information: [place] [address] [phone]           Duration of Discharge Encounter: Greater than 30 minutes including physician time.  Venetia Night, PA-C 10/08/2016 9:07 AM  EP Attending  Patient seen and examined. Agree with above. His pocket looks good. He is hemodynamically stable. He may be discharged home with usual followup.   Mikle Bosworth.D.

## 2016-10-08 NOTE — Discharge Instructions (Signed)
° ° °  Supplemental Discharge Instructions for  Pacemaker/Defibrillator Patients  Activity No heavy lifting or vigorous activity with your left/right arm for 6 to 8 weeks.  Do not raise your left/right arm above your head for one week.  Gradually raise your affected arm as drawn below.          NO DRIVING for  1 week   ; you may begin driving on  [id number]  .  WOUND CARE - Keep the wound area clean and dry.  Do not get this area wet, no showers until cleared to at your wound check visit. - The tape/steri-strips on your wound will fall off; do not pull them off.  No bandage is needed on the site.  DO  NOT apply any creams, oils, or ointments to the wound area. - If you notice any drainage or discharge from the wound, any swelling or bruising at the site, or you develop a fever > 101? F after you are discharged home, call the office at once.

## 2016-10-08 NOTE — Progress Notes (Signed)
Foley cath removed at 0610. Tolerated well.

## 2016-10-11 NOTE — CV Procedure (Signed)
  Cardiothoracic Surgery  I spent 1.5 hours as surgical backup for pacemaker extraction by Dr. Lovena Le on 10/07/2016.

## 2016-10-15 ENCOUNTER — Other Ambulatory Visit: Payer: Self-pay | Admitting: Cardiovascular Disease

## 2016-10-20 ENCOUNTER — Ambulatory Visit (INDEPENDENT_AMBULATORY_CARE_PROVIDER_SITE_OTHER): Payer: Medicare Other | Admitting: *Deleted

## 2016-10-20 DIAGNOSIS — I428 Other cardiomyopathies: Secondary | ICD-10-CM

## 2016-10-20 NOTE — Progress Notes (Signed)
Wound check appointment. Steri-strips removed. Wound without redness or edema. Incision edges unapproximated at right lateral edge. No stitch observed. Area cleaned and ster strips applied. Pt. agreeable to a one week wound re-check.

## 2016-10-25 ENCOUNTER — Other Ambulatory Visit: Payer: Self-pay | Admitting: Cardiovascular Disease

## 2016-10-27 ENCOUNTER — Ambulatory Visit (INDEPENDENT_AMBULATORY_CARE_PROVIDER_SITE_OTHER): Payer: Medicare Other | Admitting: *Deleted

## 2016-10-27 VITALS — BP 96/69 | Temp 98.6°F

## 2016-10-27 DIAGNOSIS — Z95810 Presence of automatic (implantable) cardiac defibrillator: Secondary | ICD-10-CM | POA: Diagnosis not present

## 2016-10-27 DIAGNOSIS — I10 Essential (primary) hypertension: Secondary | ICD-10-CM

## 2016-10-27 NOTE — Progress Notes (Signed)
Wound re-check appointment. Steri-strips removed. Wound without redness or edema. Wound well healed. Incision edges approximated. ROV with GT 01/13/17.

## 2016-11-03 ENCOUNTER — Telehealth: Payer: Self-pay | Admitting: Cardiology

## 2016-11-03 ENCOUNTER — Ambulatory Visit: Payer: Medicare Other

## 2016-11-03 NOTE — Telephone Encounter (Signed)
Called patient. No answer. Left message to call back.  

## 2016-11-03 NOTE — Telephone Encounter (Signed)
Per phone call--pt is having some redness around his incision, would like to know what he should do.

## 2016-11-05 ENCOUNTER — Ambulatory Visit (INDEPENDENT_AMBULATORY_CARE_PROVIDER_SITE_OTHER): Payer: Medicare Other | Admitting: Cardiology

## 2016-11-05 ENCOUNTER — Encounter: Payer: Self-pay | Admitting: Cardiology

## 2016-11-05 DIAGNOSIS — R0789 Other chest pain: Secondary | ICD-10-CM

## 2016-11-05 DIAGNOSIS — G458 Other transient cerebral ischemic attacks and related syndromes: Secondary | ICD-10-CM

## 2016-11-05 DIAGNOSIS — G4733 Obstructive sleep apnea (adult) (pediatric): Secondary | ICD-10-CM | POA: Diagnosis not present

## 2016-11-05 DIAGNOSIS — I428 Other cardiomyopathies: Secondary | ICD-10-CM | POA: Diagnosis not present

## 2016-11-05 DIAGNOSIS — I952 Hypotension due to drugs: Secondary | ICD-10-CM

## 2016-11-05 DIAGNOSIS — Z95810 Presence of automatic (implantable) cardiac defibrillator: Secondary | ICD-10-CM | POA: Diagnosis not present

## 2016-11-05 MED ORDER — CARVEDILOL 12.5 MG PO TABS: 12.5000 mg | ORAL_TABLET | Freq: Two times a day (BID) | ORAL | 3 refills | Status: DC

## 2016-11-05 NOTE — Patient Instructions (Signed)
Your physician recommends that you schedule a follow-up appointment in: 1 week with K lawrence NP    HOLD Lasix  HOLD lisinopril   No COREG TONIGHT, then TOMORROW, Reduce Coreg to 12.5 mg twice a day      Thank you for choosing [facility] !

## 2016-11-05 NOTE — Assessment & Plan Note (Signed)
Will back off on his medications-

## 2016-11-05 NOTE — Progress Notes (Signed)
11/05/2016 Mike Floyd   11-30-1960  [id number]  Primary Physician Glo Herring, MD Primary Cardiologist: Dr Bronson Ing  HPI:  56 y/o male with a history of a nonischemic cardiomyopathy although left ventricular systolic function has since improved with normal EF in Dec 2017-EF 60-65%. He has an AICD and is followed by EP. He also has a history of WPW and is followed by Dr. Lovena Le (EP), and underwent radiofrequency ablation in 1999 at [facility]. He recently has had lead failure (broken lead) and his device had reached ERI. It was decided to remove the device and the lead. This was done 10/07/16 by Dr Lovena Le. The pt is in the office today for follow up. He says he has felt weak, gives out with any exertion. This was present pre op as well and the pt was  Expecting this to improve once his pacemaker and lead were removed. His B/P in the office is 82/58. He has already taken today's medications.    Current Outpatient Prescriptions  Medication Sig Dispense Refill  . amitriptyline (ELAVIL) 50 MG tablet Take 50 mg by mouth at bedtime.     Marland Kitchen aspirin 81 MG tablet Take 81 mg by mouth daily as needed for pain.    Marland Kitchen atorvastatin (LIPITOR) 20 MG tablet Take 20 mg by mouth daily.    Marland Kitchen CALCIUM PO Take 1 tablet by mouth daily.    . clopidogrel (PLAVIX) 75 MG tablet Take 75 mg by mouth daily.    . colchicine 0.6 MG tablet Take 0.12 mg by mouth 2 (two) times daily.     . furosemide (LASIX) 20 MG tablet TAKE 1 TABLET BY MOUTH  DAILY 90 tablet 2  . gabapentin (NEURONTIN) 300 MG capsule Take 300 mg by mouth 3 (three) times daily.     Marland Kitchen glimepiride (AMARYL) 4 MG tablet Take 4 mg by mouth daily before breakfast.    . L-Methylfolate-B6-B12 (FOLTANX) 3-35-2 MG TABS Take 1 tablet by mouth 2 (two) times daily.    Marland Kitchen lisinopril (PRINIVIL,ZESTRIL) 5 MG tablet TAKE ONE TABLET BY MOUTH ONCE DAILY. 90 tablet 0  . LORazepam (ATIVAN) 1 MG tablet Take 1 mg by mouth 3 (three) times daily.     . metoCLOPramide  (REGLAN) 10 MG tablet TAKE 1 TABLET BY MOUTH 30 MINUTES BEFORE BREAKFAST AND EVENING MEAL DAILY. 60 tablet 2  . omeprazole (PRILOSEC) 40 MG capsule TAKE (1) CAPSULE BY MOUTH ONCE DAILY. 30 capsule 6  . oxycodone (ROXICODONE) 30 MG immediate release tablet Take 1-2 tablets by mouth 4 (four) times daily as needed for pain.     Marland Kitchen sucralfate (CARAFATE) 1 g tablet TAKE 1 TABLET BY MOUTH 30 MINTUES BEFORE MEALS AND AT BEDTIME. (Patient taking differently: TAKE 1 TABLET BY MOUTH 30 MINTUES BEFORE EACH MEALS AND AT BEDTIME.) 120 tablet 5  . carvedilol (COREG) 12.5 MG tablet Take 1 tablet (12.5 mg total) by mouth 2 (two) times daily. 180 tablet 3   No current facility-administered medications for this visit.     No Known Allergies  Past Medical History:  Diagnosis Date  . AICD (automatic cardioverter/defibrillator) present 11/2007   a. 11/2007 [facility] Current VR - single lead ICD  . Anxiety   . Chest pain    a. 10/2007 Cath:  normal Cors.  . CKD (chronic kidney disease), stage II   . DDD (degenerative disc disease), lumbar   . Diabetes mellitus DX: 2010  . Erosive esophagitis    a. per EGD (  08/2011), Dr. Laural Golden - Erosive reflux esophagitis improved but not completely healed since previous EGD 3 years ago. Bx showing  ulcerated gatroesophageal junction mucosa. negative for H. pylori  . GERD (gastroesophageal reflux disease)   . Gout   . Hearing deficit    a. wear bilateral hearing aides  . History of hiatal hernia   . Hypertension   . Myocardial infarction 2011  . Nonischemic dilated cardiomyopathy ([place])    a. H/O EF as low as 35-40% by LV gram 10/2007;  b. Echo 02/2011 EF 50-55%, inf HK, Gr 1 DD   . Renal insufficiency   . Sleep apnea    pt doesnt use, states"I cant afford one". PCP aware  . Stroke ([place])    mini-stroke in 2014  . TIA (transient ischemic attack)    July, 2013  . Tobacco abuse, in remission 06/27/2009   Discontinued in 2009    . Wears dentures    top plate  . WPW  (Wolff-Parkinson-White syndrome)    a. s/p RFCA @ [place] History  . Marital status: Married    Spouse name: N/A  . Number of children: N/A  . Years of education: N/A   Occupational History  . Worked for [employer] off in 11/11   Social History Main Topics  . Smoking status: Former Smoker    Packs/day: 1.50    Years: 35.00    Types: Cigarettes    Start date: 06/30/1971    Quit date: 07/30/2010  . Smokeless tobacco: Former Systems developer    Types: [place] date: 03/26/1994     Comment: currently uses a vape 11-06-2014.  Marland Kitchen Alcohol use No     Comment: used "years ago"  . Drug use: No  . Sexual activity: No   Other Topics Concern  . Not on file   Social History Narrative  . No narrative on file     Family History  Problem Relation Age of Onset  . Heart attack Mother 42  . Hypertension Mother   . Diabetes Mother   . Kidney disease Mother   . Breast cancer Mother   . Heart attack Father 59  . Hypertension Father   . Diabetes Father   . Stroke Brother   . Heart attack Brother 51  . Stroke Maternal Grandmother 80  . Diabetes Brother   . Hypertension Brother      Review of Systems: General: negative for chills, fever, night sweats or weight changes.  Cardiovascular: negative for chest pain, dyspnea on exertion, edema, orthopnea, palpitations, paroxysmal nocturnal dyspnea or shortness of breath Dermatological: negative for rash Respiratory: negative for cough or wheezing Urologic: negative for hematuria Abdominal: negative for nausea, vomiting, diarrhea, bright red blood per rectum, melena, or hematemesis Neurologic: negative for visual changes, syncope, or dizziness All other systems reviewed and are otherwise negative except as noted above.    Blood pressure (!) 84/60, pulse 94, height 6\' 1"  (1.854 m), weight 223 lb (101.2 kg), SpO2 97 %.  General appearance: alert, cooperative and no distress Lungs: clear to  auscultation bilaterally and pacer site is flucuant, not red or hot, no obvious drainage Heart: regular rate and rhythm Extremities: extremities normal, atraumatic, no cyanosis or edema Neurologic: Grossly normal   ASSESSMENT AND PLAN:   Cardiomyopathy, nonischemic ([place]) This resolved- last EF 55-60% dec 2017  AICD (automatic cardioverter/defibrillator) ICD implanted April 2009. Removed 10/07/16 as it  was at EOL, not functioning properly, and the pt's EF had normalized.   Chest pain Chronic chest pain- normal coronaries 2009  OSA (obstructive sleep apnea) Has been diagnosed in 2007 but did not tolerate CPAP  TIA (transient ischemic attack) Admitted in 02/2012; left-sided weakness that resolved completely with possible seizure; negative CT, EEG and carotid ultrasound; Rx-clopidogrel  Hypotension due to drugs Will back off on his medications-   PLAN  Hold Coreg tonight, decrease dose to Coreg 12.5 mg BID tomorrow. Hold Lisinopril and Lasix till seen next week in the office.   Kerin Ransom PA-C 11/05/2016 3:01 PM

## 2016-11-05 NOTE — Assessment & Plan Note (Signed)
Admitted in 02/2012; left-sided weakness that resolved completely with possible seizure; negative CT, EEG and carotid ultrasound; Rx-clopidogrel

## 2016-11-05 NOTE — Assessment & Plan Note (Signed)
ICD implanted April 2009. Removed 10/07/16 as it was at EOL, not functioning properly, and the pt's EF had normalized.

## 2016-11-05 NOTE — Assessment & Plan Note (Signed)
Has been diagnosed in 2007 but did not tolerate CPAP

## 2016-11-05 NOTE — Assessment & Plan Note (Signed)
Chronic chest pain- normal coronaries 2009

## 2016-11-05 NOTE — Telephone Encounter (Signed)
Seen in office today by l kilroy PA-C

## 2016-11-05 NOTE — Assessment & Plan Note (Signed)
This resolved- last EF 55-60% dec 2017

## 2016-11-15 ENCOUNTER — Ambulatory Visit: Payer: Medicare Other | Admitting: Adult Health

## 2016-11-29 DIAGNOSIS — Z1389 Encounter for screening for other disorder: Secondary | ICD-10-CM | POA: Diagnosis not present

## 2016-11-29 DIAGNOSIS — E119 Type 2 diabetes mellitus without complications: Secondary | ICD-10-CM | POA: Diagnosis not present

## 2017-01-13 ENCOUNTER — Encounter: Payer: Self-pay | Admitting: Internal Medicine

## 2017-01-13 ENCOUNTER — Ambulatory Visit (INDEPENDENT_AMBULATORY_CARE_PROVIDER_SITE_OTHER): Payer: Medicare Other | Admitting: Internal Medicine

## 2017-01-13 VITALS — BP 117/77 | HR 89 | Ht 73.0 in | Wt 228.0 lb

## 2017-01-13 DIAGNOSIS — I10 Essential (primary) hypertension: Secondary | ICD-10-CM | POA: Diagnosis not present

## 2017-01-13 DIAGNOSIS — R0789 Other chest pain: Secondary | ICD-10-CM | POA: Diagnosis not present

## 2017-01-13 MED ORDER — NITROGLYCERIN 0.4 MG SL SUBL: 0.4000 mg | SUBLINGUAL_TABLET | SUBLINGUAL | 3 refills | Status: DC | PRN

## 2017-01-13 NOTE — Patient Instructions (Signed)
Your physician wants you to follow-up in: 6 Months with Dr. Lovena Le. You will receive a reminder letter in the mail two months in advance. If you don't receive a letter, please call our office to schedule the follow-up appointment.  Your physician has recommended you make the following change in your medication:   Start Nitro 0.4 mg as needed    You have been given a copy of a low salt diet.    If you need a refill on your cardiac medications before your next appointment, please call your pharmacy.  Thank you for choosing [facility]!  Nitroglycerin sublingual tablets What is this medicine? NITROGLYCERIN (nye troe GLI ser in) is a type of vasodilator. It relaxes blood vessels, increasing the blood and oxygen supply to your heart. This medicine is used to relieve chest pain caused by angina. It is also used to prevent chest pain before activities like climbing stairs, going outdoors in cold weather, or sexual activity. This medicine may be used for other purposes; ask your health care provider or pharmacist if you have questions. COMMON BRAND NAME(S): Nitroquick, Nitrostat, Nitrotab What should I tell my health care provider before I take this medicine? They need to know if you have any of these conditions: -anemia -head injury, recent stroke, or bleeding in the brain -liver disease -previous heart attack -an unusual or allergic reaction to nitroglycerin, other medicines, foods, dyes, or preservatives -pregnant or trying to get pregnant -breast-feeding How should I use this medicine? Take this medicine by mouth as needed. At the first sign of an angina attack (chest pain or tightness) place one tablet under your tongue. You can also take this medicine 5 to 10 minutes before an event likely to produce chest pain. Follow the directions on the prescription label. Let the tablet dissolve under the tongue. Do not swallow whole. Replace the dose if you accidentally swallow it. It will  help if your mouth is not dry. Saliva around the tablet will help it to dissolve more quickly. Do not eat or drink, smoke or chew tobacco while a tablet is dissolving. If you are not better within 5 minutes after taking ONE dose of nitroglycerin, call 9-1-1 immediately to seek emergency medical care. Do not take more than 3 nitroglycerin tablets over 15 minutes. If you take this medicine often to relieve symptoms of angina, your doctor or health care professional may provide you with different instructions to manage your symptoms. If symptoms do not go away after following these instructions, it is important to call 9-1-1 immediately. Do not take more than 3 nitroglycerin tablets over 15 minutes. Talk to your pediatrician regarding the use of this medicine in children. Special care may be needed. Overdosage: If you think you have taken too much of this medicine contact a poison control center or emergency room at once. NOTE: This medicine is only for you. Do not share this medicine with others. What if I miss a dose? This does not apply. This medicine is only used as needed. What may interact with this medicine? Do not take this medicine with any of the following medications: -certain migraine medicines like ergotamine and dihydroergotamine (DHE) -medicines used to treat erectile dysfunction like sildenafil, tadalafil, and vardenafil -riociguat This medicine may also interact with the following medications: -alteplase -aspirin -heparin -medicines for high blood pressure -medicines for mental depression -other medicines used to treat angina -phenothiazines like chlorpromazine, mesoridazine, prochlorperazine, thioridazine This list may not describe all possible interactions. Give your health  care provider a list of all the medicines, herbs, non-prescription drugs, or dietary supplements you use. Also tell them if you smoke, drink alcohol, or use illegal drugs. Some items may interact with your  medicine. What should I watch for while using this medicine? Tell your doctor or health care professional if you feel your medicine is no longer working. Keep this medicine with you at all times. Sit or lie down when you take your medicine to prevent falling if you feel dizzy or faint after using it. Try to remain calm. This will help you to feel better faster. If you feel dizzy, take several deep breaths and lie down with your feet propped up, or bend forward with your head resting between your knees. You may get drowsy or dizzy. Do not drive, use machinery, or do anything that needs mental alertness until you know how this drug affects you. Do not stand or sit up quickly, especially if you are an older patient. This reduces the risk of dizzy or fainting spells. Alcohol can make you more drowsy and dizzy. Avoid alcoholic drinks. Do not treat yourself for coughs, colds, or pain while you are taking this medicine without asking your doctor or health care professional for advice. Some ingredients may increase your blood pressure. What side effects may I notice from receiving this medicine? Side effects that you should report to your doctor or health care professional as soon as possible: -blurred vision -dry mouth -skin rash -sweating -the feeling of extreme pressure in the head -unusually weak or tired Side effects that usually do not require medical attention (report to your doctor or health care professional if they continue or are bothersome): -flushing of the face or neck -headache -irregular heartbeat, palpitations -nausea, vomiting This list may not describe all possible side effects. Call your doctor for medical advice about side effects. You may report side effects to FDA at 1-800-FDA-1088. Where should I keep my medicine? Keep out of the reach of children. Store at room temperature between 20 and 25 degrees C (68 and 77 degrees F). Store in [facility] of [facility]. Protect from light and  moisture. Keep tightly closed. Throw away any unused medicine after the expiration date. NOTE: This sheet is a summary. It may not cover all possible information. If you have questions about this medicine, talk to your doctor, pharmacist, or health care provider.  2018 Elsevier/Gold Standard (2013-05-24 17:57:36)

## 2017-01-13 NOTE — Progress Notes (Signed)
HPI [name] returns today for followup. He is a very pleasant 56 year old man with a nonischemic cardiomyopathy, chronic congestive heart failure, status post ICD implantation. The patient was found to have ICD lead dysfunction and normalization of his LV function and underwent ICD system extraction. In the interim, he has had atypical chest pain and sob with exertion. He is fairly sedentary. No syncope. Minimal edema. No Known Allergies   Current Outpatient Prescriptions  Medication Sig Dispense Refill  . amitriptyline (ELAVIL) 50 MG tablet Take 50 mg by mouth at bedtime.     Marland Kitchen aspirin 81 MG tablet Take 81 mg by mouth daily as needed for pain.    Marland Kitchen atorvastatin (LIPITOR) 20 MG tablet Take 20 mg by mouth daily.    Marland Kitchen CALCIUM PO Take 1 tablet by mouth daily.    . carvedilol (COREG) 12.5 MG tablet Take 1 tablet (12.5 mg total) by mouth 2 (two) times daily. 180 tablet 3  . clopidogrel (PLAVIX) 75 MG tablet Take 75 mg by mouth daily.    . colchicine 0.6 MG tablet Take 0.12 mg by mouth 2 (two) times daily.     . furosemide (LASIX) 20 MG tablet TAKE 1 TABLET BY MOUTH  DAILY 90 tablet 2  . gabapentin (NEURONTIN) 300 MG capsule Take 300 mg by mouth 3 (three) times daily.     Marland Kitchen glimepiride (AMARYL) 4 MG tablet Take 4 mg by mouth daily before breakfast.    . L-Methylfolate-B6-B12 (FOLTANX) 3-35-2 MG TABS Take 1 tablet by mouth 2 (two) times daily.    Marland Kitchen lisinopril (PRINIVIL,ZESTRIL) 5 MG tablet TAKE ONE TABLET BY MOUTH ONCE DAILY. 90 tablet 0  . LORazepam (ATIVAN) 1 MG tablet Take 1 mg by mouth 3 (three) times daily.     . metoCLOPramide (REGLAN) 10 MG tablet TAKE 1 TABLET BY MOUTH 30 MINUTES BEFORE BREAKFAST AND EVENING MEAL DAILY. 60 tablet 2  . omeprazole (PRILOSEC) 40 MG capsule TAKE (1) CAPSULE BY MOUTH ONCE DAILY. 30 capsule 6  . oxycodone (ROXICODONE) 30 MG immediate release tablet Take 1-2 tablets by mouth 4 (four) times daily as needed for pain.     Marland Kitchen sucralfate (CARAFATE) 1 g tablet TAKE 1  TABLET BY MOUTH 30 MINTUES BEFORE MEALS AND AT BEDTIME. (Patient taking differently: TAKE 1 TABLET BY MOUTH 30 MINTUES BEFORE EACH MEALS AND AT BEDTIME.) 120 tablet 5   No current facility-administered medications for this visit.      Past Medical History:  Diagnosis Date  . AICD (automatic cardioverter/defibrillator) present 11/2007   a. 11/2007 SJM Current VR - single lead ICD  . Anxiety   . Chest pain    a. 10/2007 Cath:  normal Cors.  . CKD (chronic kidney disease), stage II   . DDD (degenerative disc disease), lumbar   . Diabetes mellitus DX: 2010  . Erosive esophagitis    a. per EGD (08/2011), Dr. Laural Golden - Erosive reflux esophagitis improved but not completely healed since previous EGD 3 years ago. Bx showing  ulcerated gatroesophageal junction mucosa. negative for H. pylori  . GERD (gastroesophageal reflux disease)   . Gout   . Hearing deficit    a. wear bilateral hearing aides  . History of hiatal hernia   . Hypertension   . Myocardial infarction ([place]) 2011  . Nonischemic dilated cardiomyopathy ([place])    a. H/O EF as low as 35-40% by LV gram 10/2007;  b. Echo 02/2011 EF 50-55%, inf HK, Gr 1 DD   . Renal  insufficiency   . Sleep apnea    pt doesnt use, states"I cant afford one". PCP aware  . Stroke ([place])    mini-stroke in 2014  . TIA (transient ischemic attack)    July, 2013  . Tobacco abuse, in remission 06/27/2009   Discontinued in 2009    . Wears dentures    top plate  . WPW (Wolff-Parkinson-White syndrome)    a. s/p RFCA @ [facility]:   All systems reviewed and negative except as noted in the HPI.   Past Surgical History:  Procedure Laterality Date  . APPENDECTOMY    . BACK SURGERY     1995  . CARDIAC CATHETERIZATION     2009  . CARDIAC DEFIBRILLATOR PLACEMENT    . CHOLECYSTECTOMY    . CHOLECYSTECTOMY, LAPAROSCOPIC     11/2007  . COLONOSCOPY W/ POLYPECTOMY  2009  . ELBOW SURGERY Left 06/2010  . ESOPHAGEAL DILATION N/A 11/08/2014   Procedure:  ESOPHAGEAL DILATION;  Surgeon: Rogene Houston, MD;  Location: [facility];  Service: Endoscopy;  Laterality: N/A;  #[id number],   . ESOPHAGOGASTRODUODENOSCOPY  03/31/2012   also 08/2011; [name]  . ESOPHAGOGASTRODUODENOSCOPY (EGD) WITH PROPOFOL N/A 11/08/2014   Procedure: ESOPHAGOGASTRODUODENOSCOPY (EGD) WITH PROPOFOL;  Surgeon: Rogene Houston, MD;  Location: [facility];  Service: Endoscopy;  Laterality: N/A;  [place] is 39 , [place] is 37  . ICD LEAD REMOVAL N/A 10/07/2016   Procedure: ICD LEAD REMOVAL ;  Surgeon: Evans Lance, MD;  Location: [facility];  Service: Cardiovascular;  Laterality: N/A;  . MULTIPLE TOOTH EXTRACTIONS    . [place]; performed at [facility] N/A 10/07/2016   Procedure: TRANSESOPHAGEAL ECHOCARDIOGRAM (TEE);  Surgeon: Evans Lance, MD;  Location: [facility] OR;  Service: Cardiovascular;  Laterality: N/A;     Family History  Problem Relation Age of Onset  . Heart attack Mother 56  . Hypertension Mother   . Diabetes Mother   . Kidney disease Mother   . Breast cancer Mother   . Heart attack Father 36  . Hypertension Father   . Diabetes Father   . Stroke Brother   . Heart attack Brother 53  . Stroke Maternal Grandmother 80  . Diabetes Brother   . Hypertension Brother      Social History   Social History  . Marital status: Married    Spouse name: N/A  . Number of children: N/A  . Years of education: N/A   Occupational History  . Worked for [employer] off in 11/11   Social History Main Topics  . Smoking status: Former Smoker    Packs/day: 1.50    Years: 35.00    Types: Cigarettes    Start date: 06/30/1971    Quit date: 07/30/2010  . Smokeless tobacco: Former Systems developer    Types: [place] date: 03/26/1994     Comment: currently uses a vape 11-06-2014.  Marland Kitchen Alcohol use No     Comment: used "years ago"  . Drug use: No  . Sexual activity: No   Other Topics Concern  . Not on file   Social History  Narrative  . No narrative on file     BP 117/77   Pulse 89   Ht 6\' 1"  (1.854 m)   Wt 228 lb (103.4 kg)   SpO2 98%   BMI 30.08 kg/m   Physical  Exam:  Well appearing middle-aged man, NAD HEENT: Unremarkable Neck:  7 cm JVD, no thyromegally Back:  No CVA tenderness Lungs:  Clear except for scattered basilar rales, no wheezes, no rhonchi. HEART:  Regular rate rhythm, no murmurs, no rubs, no clicks Abd:  soft, positive bowel sounds, no organomegally, no rebound, no guarding Ext:  2 plus pulses, no edema, no cyanosis, no clubbing Skin:  No rashes no nodules Neuro:  CN II through XII intact, motor grossly intact  DEVICE  Normal device function.  See PaceArt for details.   Assess/Plan: 1. ICD extraction - He has had no evidence of infection after the procedure and his wound has healed up.  2. Chronic systolic heart failure - as above. His EF has normalized. He will continue his curernt meds. 3.chest pain - he has atypical pain. I am going to give a script for slntg. 4. HTN - his blood pressure is well controlled. Will follow.   Mikle Bosworth.D.

## 2017-03-29 DIAGNOSIS — L089 Local infection of the skin and subcutaneous tissue, unspecified: Secondary | ICD-10-CM | POA: Diagnosis not present

## 2017-03-29 DIAGNOSIS — E1140 Type 2 diabetes mellitus with diabetic neuropathy, unspecified: Secondary | ICD-10-CM | POA: Diagnosis not present

## 2017-03-29 DIAGNOSIS — E1129 Type 2 diabetes mellitus with other diabetic kidney complication: Secondary | ICD-10-CM | POA: Diagnosis not present

## 2017-03-29 DIAGNOSIS — Z0001 Encounter for general adult medical examination with abnormal findings: Secondary | ICD-10-CM | POA: Diagnosis not present

## 2017-03-29 DIAGNOSIS — K219 Gastro-esophageal reflux disease without esophagitis: Secondary | ICD-10-CM | POA: Diagnosis not present

## 2017-03-29 DIAGNOSIS — I428 Other cardiomyopathies: Secondary | ICD-10-CM | POA: Diagnosis not present

## 2017-03-29 DIAGNOSIS — I952 Hypotension due to drugs: Secondary | ICD-10-CM | POA: Diagnosis not present

## 2017-03-29 DIAGNOSIS — Z23 Encounter for immunization: Secondary | ICD-10-CM | POA: Diagnosis not present

## 2017-03-29 DIAGNOSIS — I43 Cardiomyopathy in diseases classified elsewhere: Secondary | ICD-10-CM | POA: Diagnosis not present

## 2017-03-29 DIAGNOSIS — N183 Chronic kidney disease, stage 3 (moderate): Secondary | ICD-10-CM | POA: Diagnosis not present

## 2017-04-07 DIAGNOSIS — Z1211 Encounter for screening for malignant neoplasm of colon: Secondary | ICD-10-CM | POA: Diagnosis not present

## 2017-04-29 DIAGNOSIS — G894 Chronic pain syndrome: Secondary | ICD-10-CM | POA: Diagnosis not present

## 2017-04-29 DIAGNOSIS — E0590 Thyrotoxicosis, unspecified without thyrotoxic crisis or storm: Secondary | ICD-10-CM | POA: Diagnosis not present

## 2017-04-29 DIAGNOSIS — E119 Type 2 diabetes mellitus without complications: Secondary | ICD-10-CM | POA: Diagnosis not present

## 2017-05-18 IMAGING — CR DG CHEST 2V
2 series · 2 of 2 positions shown · non-contrast
Comparison: 12/26/2015

CLINICAL DATA: Shortness of breath.  Status post ICD extraction.

EXAM:
CHEST  2 VIEW

[chest pa]
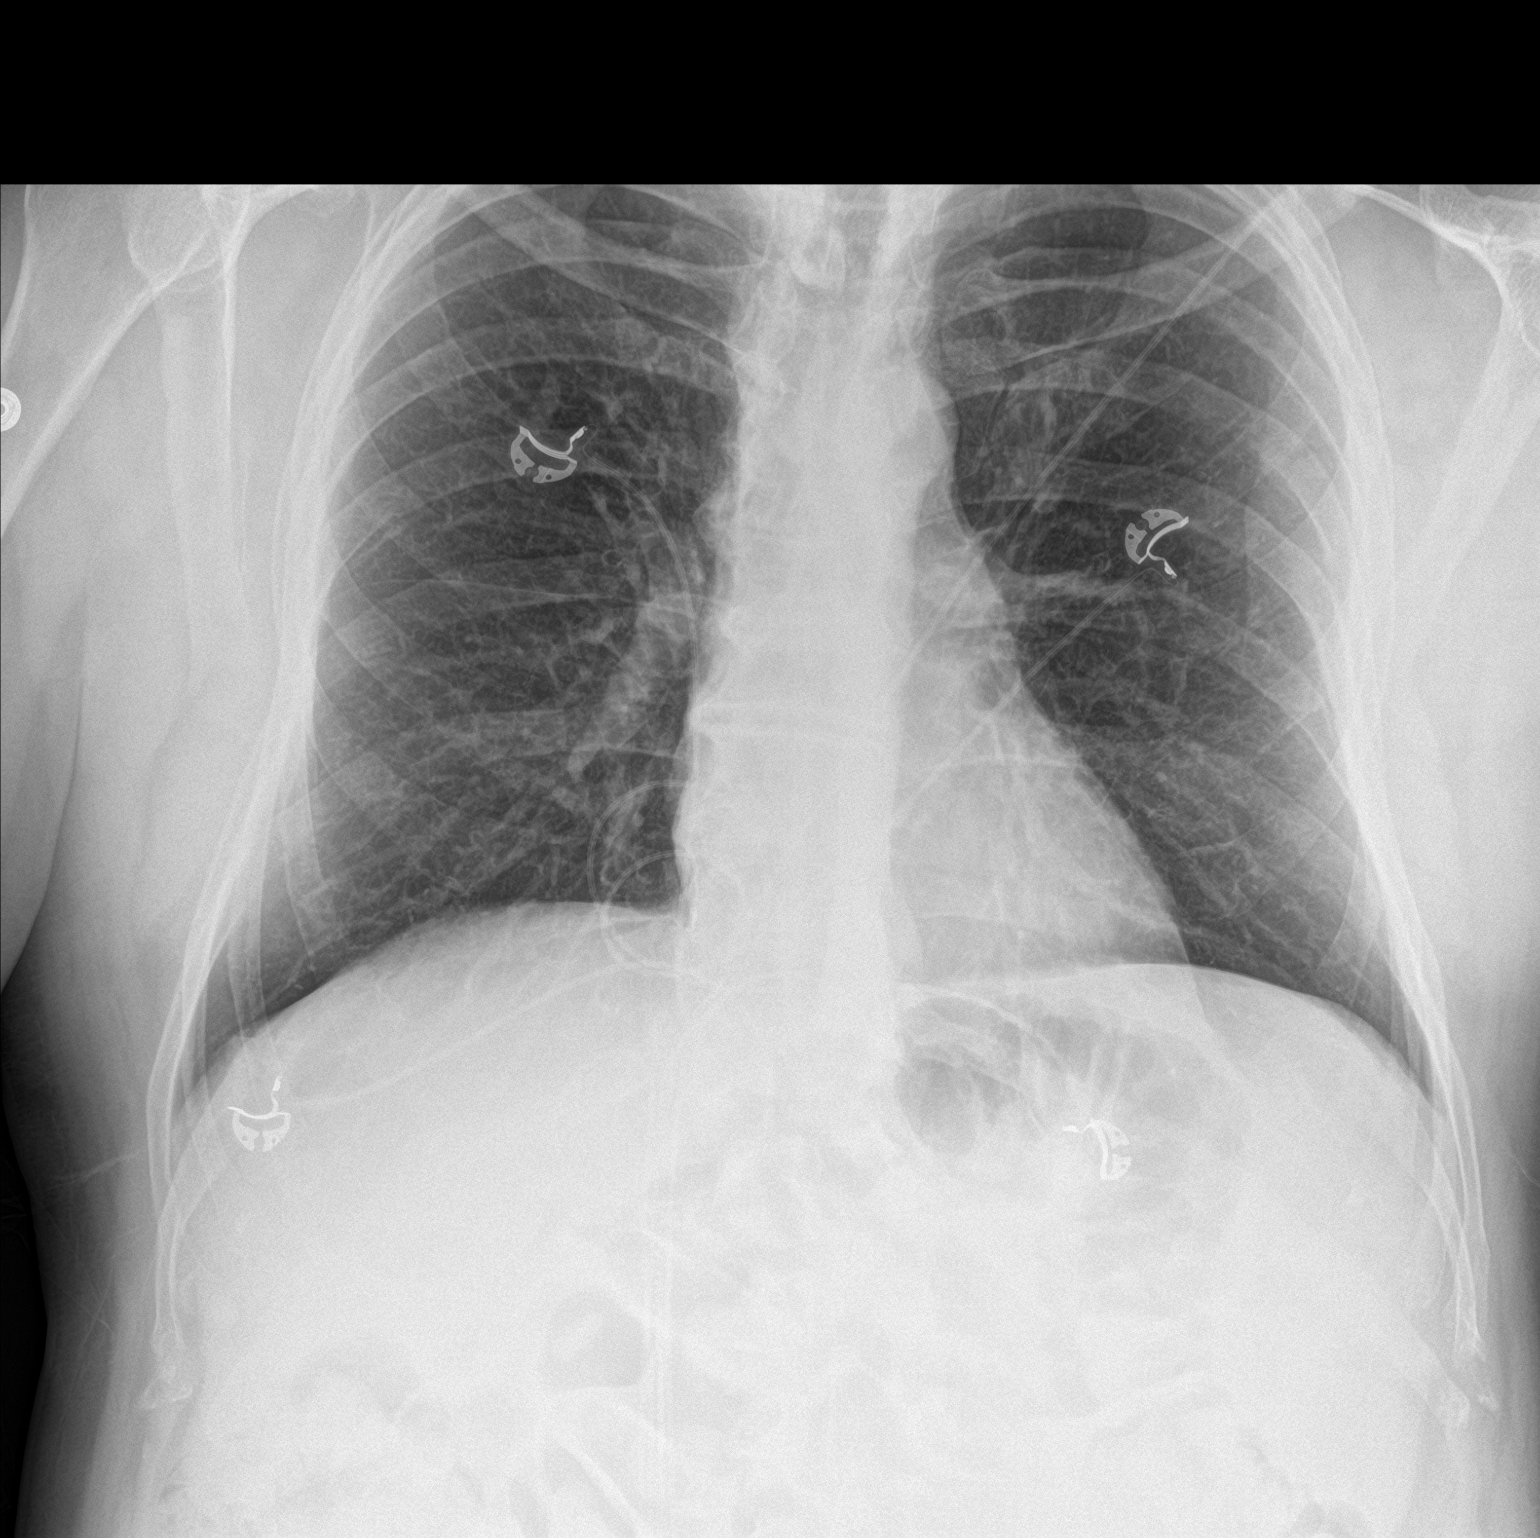

[chest lat]
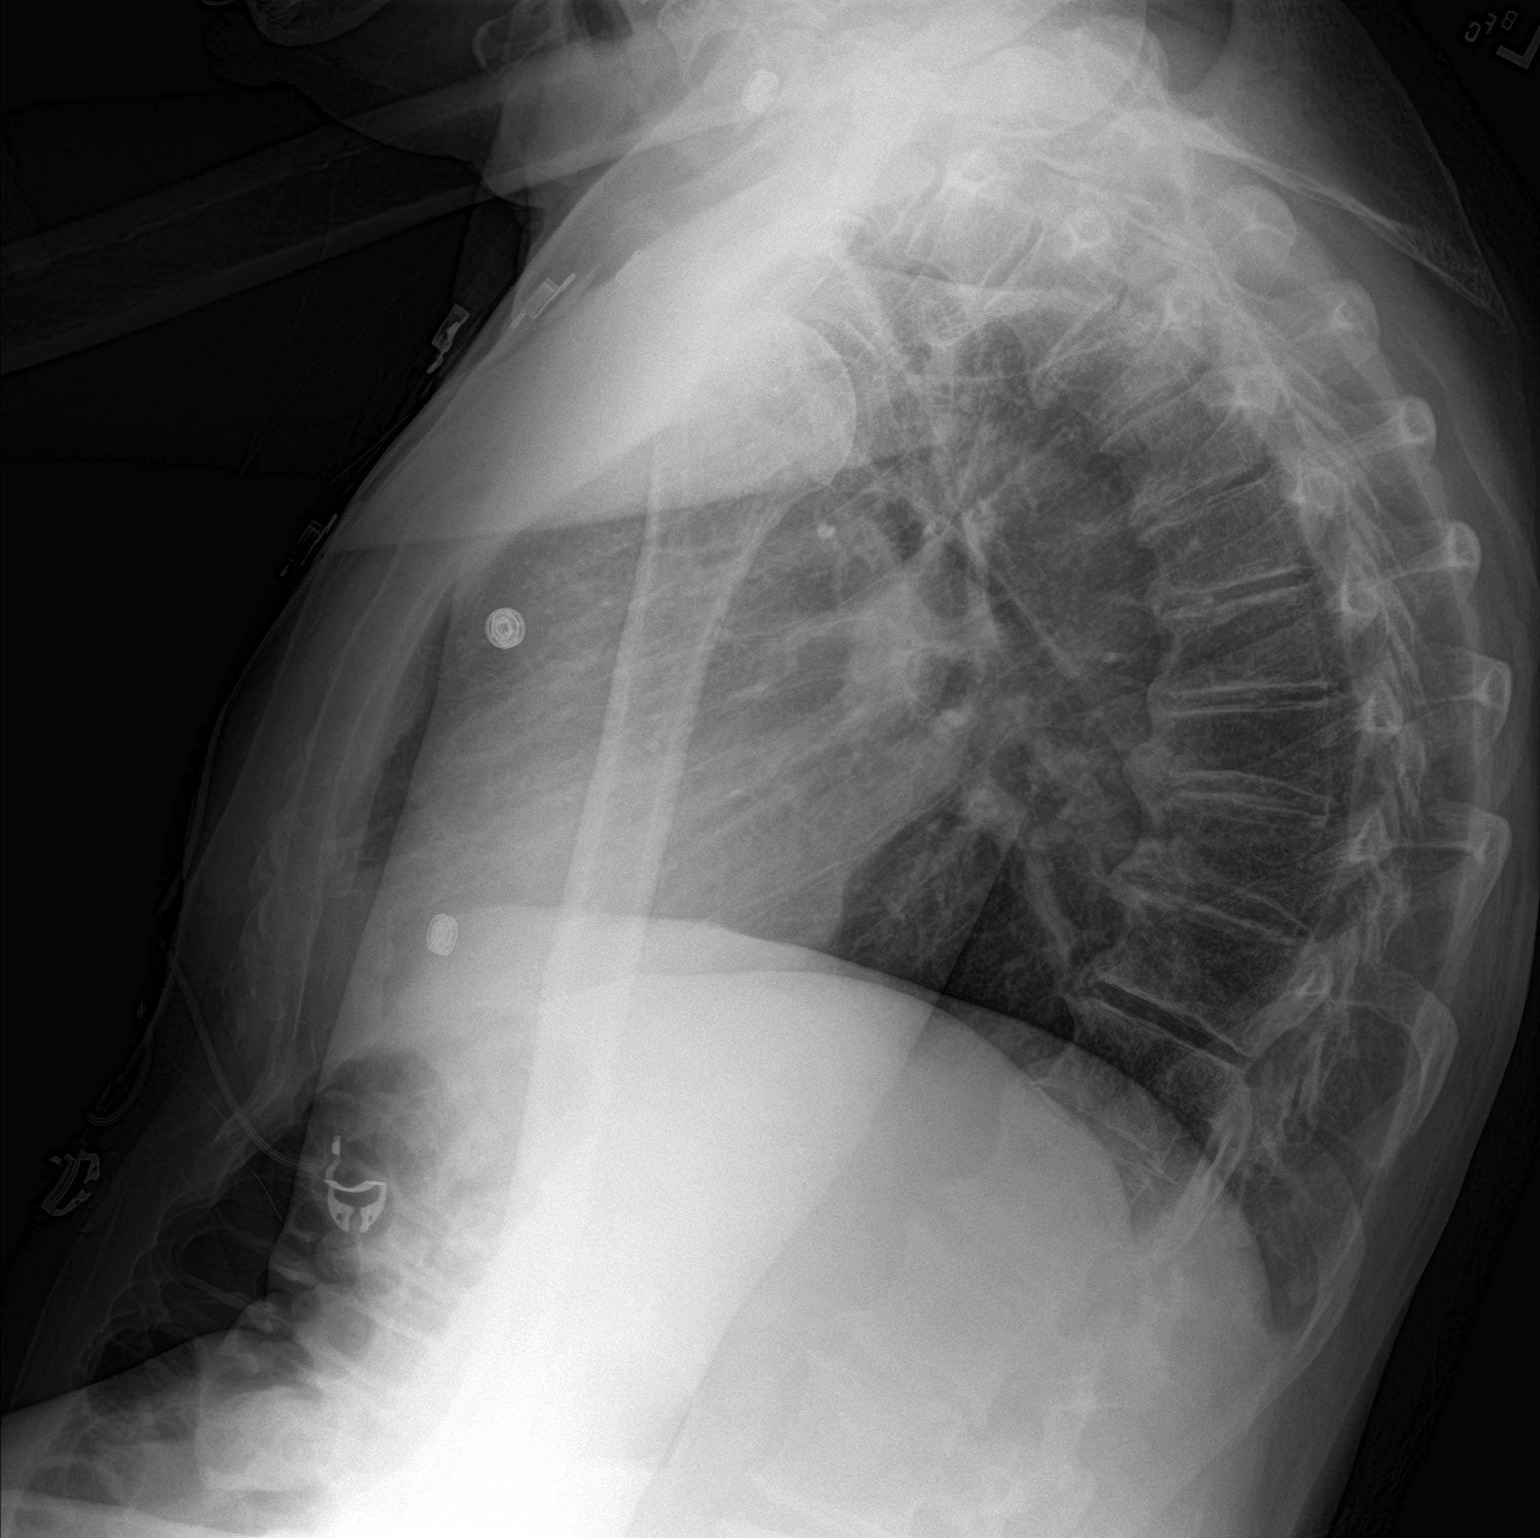

[2 of 2 positions shown; findings below may reference images not displayed]

FINDINGS: The lungs are clear wiithout focal pneumonia, edema, pneumothorax or
pleural effusion. The cardiopericardial silhouette is within normal
limits for size. The visualized bony structures of the thorax are
intact. Telemetry leads overlie the chest.
IMPRESSION: Left-sided ICD noted on prior study no longer evident. No
pneumothorax or pleural effusion.

## 2017-05-19 ENCOUNTER — Other Ambulatory Visit: Payer: Self-pay | Admitting: Cardiovascular Disease

## 2017-06-07 ENCOUNTER — Ambulatory Visit (HOSPITAL_COMMUNITY)
Admission: RE | Admit: 2017-06-07 | Discharge: 2017-06-07 | Disposition: A | Discharge status: Home / Self Care | Payer: Medicare Other | Source: Ambulatory Visit | Attending: Internal Medicine | Admitting: Internal Medicine

## 2017-06-07 ENCOUNTER — Other Ambulatory Visit (HOSPITAL_COMMUNITY): Payer: Self-pay | Admitting: Internal Medicine

## 2017-06-07 DIAGNOSIS — M19072 Primary osteoarthritis, left ankle and foot: Secondary | ICD-10-CM | POA: Diagnosis not present

## 2017-06-07 DIAGNOSIS — M79672 Pain in left foot: Secondary | ICD-10-CM

## 2017-06-07 DIAGNOSIS — N451 Epididymitis: Secondary | ICD-10-CM | POA: Diagnosis not present

## 2017-06-07 DIAGNOSIS — L97525 Non-pressure chronic ulcer of other part of left foot with muscle involvement without evidence of necrosis: Secondary | ICD-10-CM | POA: Diagnosis not present

## 2017-06-07 DIAGNOSIS — E1140 Type 2 diabetes mellitus with diabetic neuropathy, unspecified: Secondary | ICD-10-CM | POA: Diagnosis not present

## 2017-06-07 DIAGNOSIS — E0590 Thyrotoxicosis, unspecified without thyrotoxic crisis or storm: Secondary | ICD-10-CM | POA: Diagnosis not present

## 2017-06-07 DIAGNOSIS — N419 Inflammatory disease of prostate, unspecified: Secondary | ICD-10-CM | POA: Diagnosis not present

## 2017-06-07 DIAGNOSIS — G894 Chronic pain syndrome: Secondary | ICD-10-CM | POA: Diagnosis not present

## 2017-06-07 DIAGNOSIS — Z23 Encounter for immunization: Secondary | ICD-10-CM | POA: Diagnosis not present

## 2017-06-15 ENCOUNTER — Ambulatory Visit: Payer: Self-pay | Admitting: "Endocrinology

## 2017-06-15 ENCOUNTER — Encounter: Payer: Self-pay | Admitting: "Endocrinology

## 2017-06-15 ENCOUNTER — Ambulatory Visit (INDEPENDENT_AMBULATORY_CARE_PROVIDER_SITE_OTHER): Payer: Medicare Other | Admitting: "Endocrinology

## 2017-06-15 VITALS — BP 118/80 | HR 90 | Ht 73.0 in | Wt 242.0 lb

## 2017-06-15 DIAGNOSIS — E782 Mixed hyperlipidemia: Secondary | ICD-10-CM

## 2017-06-15 DIAGNOSIS — E6609 Other obesity due to excess calories: Secondary | ICD-10-CM | POA: Diagnosis not present

## 2017-06-15 DIAGNOSIS — E1159 Type 2 diabetes mellitus with other circulatory complications: Secondary | ICD-10-CM | POA: Diagnosis not present

## 2017-06-15 DIAGNOSIS — Z6831 Body mass index (BMI) 31.0-31.9, adult: Secondary | ICD-10-CM

## 2017-06-15 DIAGNOSIS — I10 Essential (primary) hypertension: Secondary | ICD-10-CM

## 2017-06-15 NOTE — Progress Notes (Signed)
Consult Note       06/15/2017, 2:19 PM   Subjective:    Patient ID: Mike Floyd, male    DOB: Jan 01, 1961.  Mike Floyd is being seen in consultation for management of currently uncontrolled symptomatic diabetes requested by  Redmond School, MD.   Past Medical History:  Diagnosis Date  . AICD (automatic cardioverter/defibrillator) present 11/2007   a. 11/2007 [facility] Current VR - single lead ICD  . Anxiety   . Chest pain    a. 10/2007 Cath:  normal [place].  . CKD (chronic kidney disease), stage II   . DDD (degenerative disc disease), lumbar   . Diabetes mellitus DX: 2010  . Erosive esophagitis    a. per EGD (08/2011), Dr. Laural Golden - Erosive reflux esophagitis improved but not completely healed since previous EGD 3 years ago. Bx showing  ulcerated gatroesophageal junction mucosa. negative for H. pylori  . GERD (gastroesophageal reflux disease)   . Gout   . Hearing deficit    a. wear bilateral hearing aides  . History of hiatal hernia   . Hypertension   . Myocardial infarction ([place]) 2011  . Nonischemic dilated cardiomyopathy ([place])    a. H/O EF as low as 35-40% by LV gram 10/2007;  b. Echo 02/2011 EF 50-55%, inf HK, Gr 1 DD   . Renal insufficiency   . Sleep apnea    pt doesnt use, states"I cant afford one". PCP aware  . Stroke ([place])    mini-stroke in 2014  . TIA (transient ischemic attack)    July, 2013  . Tobacco abuse, in remission 06/27/2009   Discontinued in 2009    . Wears dentures    top plate  . WPW (Wolff-Parkinson-White syndrome)    a. s/p RFCA @ [facility]   Past Surgical History:  Procedure Laterality Date  . APPENDECTOMY    . BACK SURGERY     1995  . CARDIAC CATHETERIZATION     2009  . CARDIAC DEFIBRILLATOR PLACEMENT    . CHOLECYSTECTOMY    . CHOLECYSTECTOMY, LAPAROSCOPIC     11/2007  . COLONOSCOPY W/ POLYPECTOMY  2009  . ELBOW SURGERY Left 06/2010  . MULTIPLE TOOTH  EXTRACTIONS    . [place]; performed at [facility] History  . Marital status: Married    Spouse name: None  . Number of children: None  . Years of education: None  . Highest education level: None  Social Needs  . Financial resource strain: None  . Food insecurity - worry: None  . Food insecurity - inability: None  . Transportation needs - medical: None  . Transportation needs - non-medical: None  Occupational History  . Occupation: Worked for [employer]: [place] off in 11/11  Tobacco Use  . Smoking status: Former Smoker    Packs/day: 1.50    Years: 35.00    Pack years: 52.50    Types: Cigarettes    Start date: 06/30/1971    Last attempt to quit: 07/30/2010    Years  since quitting: 6.8  . Smokeless tobacco: Former Systems developer    Types: [place] date: 03/26/1994  . Tobacco comment: currently uses a vape 11-06-2014.  Substance and Sexual Activity  . Alcohol use: No    Alcohol/week: 0.6 oz    Types: 1 Cans of beer per week    Comment: used "years ago"  . Drug use: No  . Sexual activity: No    Birth control/protection: None  Other Topics Concern  . None  Social History Narrative  . None   Outpatient Encounter Medications as of 06/15/2017  Medication Sig  . acarbose (PRECOSE) 50 MG tablet Take 50 mg 3 (three) times daily with meals by mouth.  Marland Kitchen amitriptyline (ELAVIL) 50 MG tablet Take 50 mg by mouth at bedtime.   Marland Kitchen aspirin 81 MG tablet Take 81 mg by mouth daily as needed for pain.  Marland Kitchen atorvastatin (LIPITOR) 20 MG tablet Take 20 mg by mouth daily.  . furosemide (LASIX) 20 MG tablet TAKE (1) TABLET BY MOUTH ONCE DAILY.  Marland Kitchen gabapentin (NEURONTIN) 300 MG capsule Take 300 mg by mouth 3 (three) times daily.   Marland Kitchen L-Methylfolate-B6-B12 (FOLTANX) 3-35-2 MG TABS Take 1 tablet by mouth 2 (two) times daily.  Marland Kitchen LORazepam (ATIVAN) 1 MG tablet Take 1 mg by mouth 3 (three) times daily.   Marland Kitchen  omeprazole (PRILOSEC) 40 MG capsule TAKE (1) CAPSULE BY MOUTH ONCE DAILY.  Marland Kitchen oxycodone (ROXICODONE) 30 MG immediate release tablet Take 1-2 tablets by mouth 4 (four) times daily as needed for pain.   Marland Kitchen sucralfate (CARAFATE) 1 g tablet TAKE 1 TABLET BY MOUTH 30 MINTUES BEFORE MEALS AND AT BEDTIME. (Patient taking differently: TAKE 1 TABLET BY MOUTH 30 MINTUES BEFORE EACH MEALS AND AT BEDTIME.)  . tamsulosin (FLOMAX) 0.4 MG CAPS capsule Take 0.4 mg daily by mouth.  . [DISCONTINUED] glimepiride (AMARYL) 4 MG tablet Take 4 mg by mouth daily before breakfast.  . CALCIUM PO Take 1 tablet by mouth daily.  . carvedilol (COREG) 12.5 MG tablet Take 1 tablet (12.5 mg total) by mouth 2 (two) times daily.  Marland Kitchen lisinopril (PRINIVIL,ZESTRIL) 5 MG tablet TAKE ONE TABLET BY MOUTH ONCE DAILY.  Marland Kitchen metoCLOPramide (REGLAN) 10 MG tablet TAKE 1 TABLET BY MOUTH 30 MINUTES BEFORE BREAKFAST AND EVENING MEAL DAILY.  . nitroGLYCERIN (NITROSTAT) 0.4 MG SL tablet Place 1 tablet (0.4 mg total) under the tongue every 5 (five) minutes as needed for chest pain.  . [DISCONTINUED] clopidogrel (PLAVIX) 75 MG tablet Take 75 mg by mouth daily.  . [DISCONTINUED] colchicine 0.6 MG tablet Take 0.12 mg by mouth 2 (two) times daily.    No facility-administered encounter medications on file as of 06/15/2017.     ALLERGIES: No Known Allergies  VACCINATION STATUS:  There is no immunization history on file for this patient.  Diabetes  He presents for his initial diabetic visit. He has type 2 diabetes mellitus. Onset time: he was diagnosed at approximate age of 56 years. His disease course has been worsening. There are no hypoglycemic associated symptoms. Pertinent negatives for hypoglycemia include no confusion, headaches, pallor or seizures. Associated symptoms include foot paresthesias, polydipsia, polyuria and visual change. Pertinent negatives for diabetes include no chest pain, no fatigue, no polyphagia and no weakness. There are no  hypoglycemic complications. Symptoms are worsening. Diabetic complications include a CVA. Risk factors for coronary artery disease include diabetes mellitus, dyslipidemia, family history, hypertension, male sex, obesity, sedentary lifestyle and tobacco exposure. Current diabetic treatment includes oral  agent (dual therapy) (He is currently taking acarbose 50 mg by mouth 3 times a day and glimepiride 4 mmol by mouth twice a day). His weight is increasing steadily. He is following a generally unhealthy diet. When asked about meal planning, he reported none. He has not had a previous visit with a dietitian. He never participates in exercise. (He did not bring any meter nor logs with him to review today. He does not have recent labs with A1c.) An ACE inhibitor/angiotensin II receptor blocker is being taken. He does not see a podiatrist.Eye exam is current.  Hyperlipidemia  This is a chronic problem. The current episode started more than 1 year ago. Exacerbating diseases include diabetes and obesity. Pertinent negatives include no chest pain, myalgias or shortness of breath. Current antihyperlipidemic treatment includes statins. Risk factors for coronary artery disease include diabetes mellitus, dyslipidemia, family history, hypertension, male sex, obesity and a sedentary lifestyle.  Hypertension  This is a chronic problem. The current episode started more than 1 year ago. Pertinent negatives include no chest pain, headaches, neck pain, palpitations or shortness of breath. Risk factors for coronary artery disease include diabetes mellitus, dyslipidemia, obesity, male gender, sedentary lifestyle and smoking/tobacco exposure. Past treatments include ACE inhibitors. Hypertensive end-organ damage includes CVA.      Review of Systems  Constitutional: Negative for chills, fatigue, fever and unexpected weight change.  HENT: Negative for dental problem, mouth sores and trouble swallowing.   Eyes: Negative for visual  disturbance.  Respiratory: Negative for cough, choking, chest tightness, shortness of breath and wheezing.   Cardiovascular: Negative for chest pain, palpitations and leg swelling.  Gastrointestinal: Negative for abdominal distention, abdominal pain, constipation, diarrhea, nausea and vomiting.  Endocrine: Positive for polydipsia and polyuria. Negative for polyphagia.  Genitourinary: Negative for dysuria, flank pain, hematuria and urgency.  Musculoskeletal: Negative for back pain, gait problem, myalgias and neck pain.  Skin: Negative for pallor, rash and wound.  Neurological: Negative for seizures, syncope, weakness, numbness and headaches.  Psychiatric/Behavioral: Negative.  Negative for confusion and dysphoric mood.    Objective:    BP 118/80 (BP Location: Right Arm, Patient Position: Sitting, Cuff Size: Normal)   Pulse 90   Ht 6\' 1"  (1.854 m)   Wt 242 lb (109.8 kg)   BMI 31.93 kg/m   Wt Readings from Last 3 Encounters:  06/15/17 242 lb (109.8 kg)  01/13/17 228 lb (103.4 kg)  11/05/16 223 lb (101.2 kg)     Physical Exam  Constitutional: He appears well-developed. He is cooperative. No distress.  HENT:  Head: Normocephalic and atraumatic.  Eyes: EOM are normal.  Neck: Normal range of motion. Neck supple. No tracheal deviation present. No thyromegaly present.  Cardiovascular: Normal rate, S1 normal, S2 normal and normal heart sounds. Exam reveals no gallop.  No murmur heard. Pulses:      Dorsalis pedis pulses are 1+ on the right side, and 1+ on the left side.       Posterior tibial pulses are 1+ on the right side, and 1+ on the left side.  Pulmonary/Chest: Breath sounds normal. No respiratory distress. He has no wheezes.  Abdominal: Soft. Bowel sounds are normal. He exhibits no distension. There is no tenderness. There is no guarding and no CVA tenderness.  Musculoskeletal: He exhibits no edema.       Right shoulder: He exhibits no swelling and no deformity.  Walks with a  cane.  Neurological: He is alert. He has normal strength and normal reflexes. No  cranial nerve deficit or sensory deficit. Gait normal.  Hard of hearing.  Skin: Skin is warm and dry. No rash noted. No cyanosis. Nails show no clubbing.  Psychiatric: His speech is normal. Cognition and memory are normal.  Reluctant affect. Disheveled and poor personal hygiene.    CMP ( most recent) CMP     Component Value Date/Time   NA 136 10/06/2016 1212   NA 138 09/03/2016 0835   K 4.3 10/06/2016 1212   CL 100 (L) 10/06/2016 1212   CO2 31 10/06/2016 1212   GLUCOSE 115 (H) 10/06/2016 1212   BUN 13 10/06/2016 1212   BUN 18 09/03/2016 0835   CREATININE 1.56 (H) 10/06/2016 1212   CREATININE 1.51 (H) 05/08/2013 1346   CALCIUM 9.6 10/06/2016 1212   PROT 6.1 12/27/2013 1446   ALBUMIN 4.6 12/27/2013 1446   AST 28 12/27/2013 1446   ALT 43 12/27/2013 1446   ALKPHOS 112 12/27/2013 1446   BILITOT 0.9 12/27/2013 1446   GFRNONAA 48 (L) 10/06/2016 1212   GFRAA 55 (L) 10/06/2016 1212     Diabetic Labs (most recent): Lab Results  Component Value Date   HGBA1C 5.2 03/15/2013   HGBA1C 6.5 (H) 03/07/2012   HGBA1C  10/26/2007    5.4 (NOTE)   The ADA recommends the following therapeutic goals for glycemic   control related to Hgb A1C measurement:   Goal of Therapy:   < 7.0% Hgb A1C   Action Suggested:  > 8.0% Hgb A1C   Ref:  Diabetes Care, 22, Suppl. 1, 1999     Lipid Panel ( most recent) Lipid Panel     Component Value Date/Time   CHOL 151 03/15/2013 0902   TRIG 446 (H) 03/15/2013 0902   HDL 27 (L) 03/15/2013 0902   CHOLHDL 5.6 03/15/2013 0902   VLDL UNABLE TO CALCULATE IF TRIGLYCERIDE OVER 400 mg/dL 03/15/2013 0902   LDLCALC UNABLE TO CALCULATE IF TRIGLYCERIDE OVER 400 mg/dL 03/15/2013 0902     Assessment & Plan:   1. DM type 2 causing vascular disease (Mike Floyd)   - Mike Floyd has currently uncontrolled symptomatic type 2 DM since  56 years of age. - He does not have recent labs with A1c. He  will be sent for new set of labs including renal function and A1c.  -his diabetes is complicated by  cerebrovascular accident, obesity/ sedentary life and Mike Floyd remains at a high risk for more acute and chronic complications which include CAD, CVA, CKD, retinopathy, and neuropathy. These are all discussed in detail with the patient.  - I have counseled him on diet management and  weight loss, by adopting a carbohydrate restricted/protein rich diet.  - Suggestion is made for him to avoid simple carbohydrates  from his diet including Cakes, Sweet Desserts, Ice Cream, Soda (diet and regular), Sweet Tea, Candies, Chips, Cookies, Store Bought Juices, Alcohol in Excess of  1-2 drinks a day, Artificial Sweeteners, and "Sugar-free" Products. This will help patient to have stable blood glucose profile and potentially avoid unintended weight gain.  - I encouraged him to switch to  unprocessed or minimally processed complex starch and increased protein intake (animal or plant source), fruits, and vegetables.  - he is advised to stick to a routine mealtimes to eat 3 meals  a day and avoid unnecessary snacks ( to snack only to correct hypoglycemia).   - he will be scheduled with Mike Floyd, Mike Floyd, Mike Floyd for individualized diabetes education.  - I have approached him  with the following individualized plan to manage diabetes and patient agrees:   - I  will proceed to initiate strict monitoring of glucose 4 times a day-before meals and at bedtime. - He will return in one week with his meter and logs for reevaluation.  - Patient is encouraged to call clinic for blood glucose levels less than 70 or above 300 mg /dl. - I will continue  acarbose 50 mg by mouth 3 times a day with meals, therapeutically suitable for patient . - I will discontinue  glimepiride, risk outweighs benefit for this patient. - He is not sure why he is not on metformin. Will be considered if renal function is normal.  - he will be  considered for incretin therapy as appropriate next visit. - Patient specific target  A1c;  LDL, HDL, Triglycerides, and  Waist Circumference were discussed in detail.  2) BP/HTN:  Controlled. Continue current medications including ACEI/ARB. 3) Lipids/HPL:   Controlled unknown.   Patient is advised to continue statins. 4)  Weight/Diet: Mike Floyd Consult will be initiated , exercise, and detailed carbohydrates information provided.  5) Chronic Care/Health Maintenance:  -he  is on ACEI/ARB and Statin medications and  is encouraged to continue to follow up with Ophthalmology, Dentist,  Podiatrist at least yearly or according to recommendations, and advised to  stay away from smoking. I have recommended yearly flu vaccine and pneumonia vaccination at least every 5 years; moderate intensity exercise for up to 150 minutes weekly; and  sleep for at least 7 hours a day.  - Time spent with the patient: 1 hour, of which >50% was spent in obtaining information about his symptoms, reviewing his previous labs, evaluations, and treatments, counseling him about his   currently uncontrolled comforted to type 2 diabetes, hypertension, hyperlipidemia, and developing a plan for long term treatment; his  questions were answered to his satisfaction.  - Patient to bring meter and  blood glucose logs during his next visit.  - I advised patient to maintain close follow up with Redmond School, MD for primary care needs.  Follow up plan: - Return in about 1 week (around 06/22/2017) for meter, and logs, labs today.  Glade Lloyd, MD [facility] [address] [place], [place] [address] Phone: [phone]  Fax: [phone]    06/15/2017, 2:19 PM  This note was partially dictated with voice recognition software. Similar sounding words can be transcribed inadequately or may not  be corrected upon review.

## 2017-06-15 NOTE — Patient Instructions (Signed)

## 2017-06-16 LAB — COMPLETE METABOLIC PANEL WITH GFR
AG RATIO: 2.2 (calc) (ref 1.0–2.5)
ALT: 40 U/L (ref 9–46)
AST: 32 U/L (ref 10–35)
Albumin: 4.7 g/dL (ref 3.6–5.1)
Alkaline phosphatase (APISO): 132 U/L — ABNORMAL HIGH (ref 40–115)
BILIRUBIN TOTAL: 1.3 mg/dL — AB (ref 0.2–1.2)
BUN/Creatinine Ratio: 7 (calc) (ref 6–22)
BUN: 11 mg/dL (ref 7–25)
CHLORIDE: 99 mmol/L (ref 98–110)
CO2: 31 mmol/L (ref 20–32)
Calcium: 9.7 mg/dL (ref 8.6–10.3)
Creat: 1.55 mg/dL — ABNORMAL HIGH (ref 0.70–1.33)
GFR, EST AFRICAN AMERICAN: 57 mL/min/{1.73_m2} — AB (ref >=60)
GFR, Est Non African American: 49 mL/min/{1.73_m2} — ABNORMAL LOW (ref >=60)
Globulin: 2.1 g/dL (calc) (ref 1.9–3.7)
Glucose, Bld: 229 mg/dL — ABNORMAL HIGH (ref 65–139)
POTASSIUM: 4.2 mmol/L (ref 3.5–5.3)
SODIUM: 138 mmol/L (ref 135–146)
Total Protein: 6.8 g/dL (ref 6.1–8.1)

## 2017-06-16 LAB — MICROALBUMIN / CREATININE URINE RATIO
CREATININE, URINE: 158 mg/dL (ref 20–320)
MICROALB/CREAT RATIO: 2 ug/mg{creat} (ref <30)
Microalb, Ur: 0.3 mg/dL

## 2017-06-16 LAB — HEMOGLOBIN A1C
EAG (MMOL/L): 8.5 (calc)
Hgb A1c MFr Bld: 7 % of total Hgb — ABNORMAL HIGH (ref <5.7)
MEAN PLASMA GLUCOSE: 154 (calc)

## 2017-06-16 LAB — VITAMIN D 25 HYDROXY (VIT D DEFICIENCY, FRACTURES): Vit D, 25-Hydroxy: 18 ng/mL — ABNORMAL LOW (ref 30–100)

## 2017-06-16 LAB — T4, FREE: Free T4: 1.1 ng/dL (ref 0.8–1.8)

## 2017-06-16 LAB — TSH: TSH: 2.43 mIU/L (ref 0.40–4.50)

## 2017-06-20 ENCOUNTER — Other Ambulatory Visit (INDEPENDENT_AMBULATORY_CARE_PROVIDER_SITE_OTHER): Payer: Self-pay | Admitting: Internal Medicine

## 2017-06-20 NOTE — Telephone Encounter (Signed)
Patient will need appointment prior to further refills per NUR.

## 2017-06-21 ENCOUNTER — Other Ambulatory Visit: Payer: Self-pay

## 2017-06-21 MED ORDER — GLUCOSE BLOOD VI STRP: ORAL_STRIP | 5 refills | Status: DC

## 2017-06-23 ENCOUNTER — Encounter: Payer: Self-pay | Admitting: "Endocrinology

## 2017-06-23 ENCOUNTER — Ambulatory Visit (INDEPENDENT_AMBULATORY_CARE_PROVIDER_SITE_OTHER): Payer: Medicare Other | Admitting: "Endocrinology

## 2017-06-23 ENCOUNTER — Ambulatory Visit: Payer: Medicare Other | Admitting: "Endocrinology

## 2017-06-23 VITALS — BP 125/82 | HR 94 | Ht 73.0 in | Wt 242.0 lb

## 2017-06-23 DIAGNOSIS — E782 Mixed hyperlipidemia: Secondary | ICD-10-CM

## 2017-06-23 DIAGNOSIS — E1159 Type 2 diabetes mellitus with other circulatory complications: Secondary | ICD-10-CM

## 2017-06-23 DIAGNOSIS — E6609 Other obesity due to excess calories: Secondary | ICD-10-CM

## 2017-06-23 DIAGNOSIS — Z6831 Body mass index (BMI) 31.0-31.9, adult: Secondary | ICD-10-CM

## 2017-06-23 DIAGNOSIS — E559 Vitamin D deficiency, unspecified: Secondary | ICD-10-CM

## 2017-06-23 DIAGNOSIS — I10 Essential (primary) hypertension: Secondary | ICD-10-CM | POA: Diagnosis not present

## 2017-06-23 DIAGNOSIS — E66811 Obesity, class 1: Secondary | ICD-10-CM

## 2017-06-23 MED ORDER — INSULIN DEGLUDEC 100 UNIT/ML ~~LOC~~ SOPN: 20.0000 [IU] | PEN_INJECTOR | Freq: Every day | SUBCUTANEOUS | 2 refills | Status: DC

## 2017-06-23 MED ORDER — METFORMIN HCL 500 MG PO TABS: 500.0000 mg | ORAL_TABLET | Freq: Two times a day (BID) | ORAL | 2 refills | Status: DC

## 2017-06-23 NOTE — Progress Notes (Signed)
Consult Note       06/23/2017, 4:00 PM   Subjective:    Patient ID: Mike Floyd, male    DOB: 01/14/1961.  Mike Floyd is being seen in consultation for management of currently uncontrolled symptomatic diabetes requested by  Redmond School, MD.   Past Medical History:  Diagnosis Date  . AICD (automatic cardioverter/defibrillator) present 11/2007   a. 11/2007 [facility] Current VR - single lead ICD  . Anxiety   . Chest pain    a. 10/2007 Cath:  normal [place].  . CKD (chronic kidney disease), stage II   . DDD (degenerative disc disease), lumbar   . Diabetes mellitus DX: 2010  . Erosive esophagitis    a. per EGD (08/2011), Dr. Laural Golden - Erosive reflux esophagitis improved but not completely healed since previous EGD 3 years ago. Bx showing  ulcerated gatroesophageal junction mucosa. negative for H. pylori  . GERD (gastroesophageal reflux disease)   . Gout   . Hearing deficit    a. wear bilateral hearing aides  . History of hiatal hernia   . Hypertension   . Myocardial infarction ([place]) 2011  . Nonischemic dilated cardiomyopathy ([place])    a. H/O EF as low as 35-40% by LV gram 10/2007;  b. Echo 02/2011 EF 50-55%, inf HK, Gr 1 DD   . Renal insufficiency   . Sleep apnea    pt doesnt use, states"I cant afford one". PCP aware  . Stroke ([place])    mini-stroke in 2014  . TIA (transient ischemic attack)    July, 2013  . Tobacco abuse, in remission 06/27/2009   Discontinued in 2009    . Wears dentures    top plate  . WPW (Wolff-Parkinson-White syndrome)    a. s/p RFCA @ [facility]   Past Surgical History:  Procedure Laterality Date  . APPENDECTOMY    . BACK SURGERY     1995  . CARDIAC CATHETERIZATION     2009  . CARDIAC DEFIBRILLATOR PLACEMENT    . CHOLECYSTECTOMY    . CHOLECYSTECTOMY, LAPAROSCOPIC     11/2007  . COLONOSCOPY W/ POLYPECTOMY  2009  . ELBOW SURGERY Left 06/2010  . ESOPHAGEAL DILATION N/A  11/08/2014   Procedure: ESOPHAGEAL DILATION;  Surgeon: Rogene Houston, MD;  Location: [facility];  Service: Endoscopy;  Laterality: N/A;  #56,   . ESOPHAGOGASTRODUODENOSCOPY  03/31/2012   also 08/2011; [name]  . ESOPHAGOGASTRODUODENOSCOPY (EGD) WITH PROPOFOL N/A 11/08/2014   Procedure: ESOPHAGOGASTRODUODENOSCOPY (EGD) WITH PROPOFOL;  Surgeon: Rogene Houston, MD;  Location: [facility];  Service: Endoscopy;  Laterality: N/A;  [place] is 39 , [place] is 37  . ICD LEAD REMOVAL N/A 10/07/2016   Procedure: ICD LEAD REMOVAL ;  Surgeon: Evans Lance, MD;  Location: [facility];  Service: Cardiovascular;  Laterality: N/A;  . MULTIPLE TOOTH EXTRACTIONS    . [place]; performed at [facility] N/A 10/07/2016   Procedure: TRANSESOPHAGEAL ECHOCARDIOGRAM (TEE);  Surgeon: Evans Lance, MD;  Location: [facility];  Service: Cardiovascular;  Laterality: N/A;   Social History  Socioeconomic History  . Marital status: Married    Spouse name: None  . Number of children: None  . Years of education: None  . Highest education level: None  Social Needs  . Financial resource strain: None  . Food insecurity - worry: None  . Food insecurity - inability: None  . Transportation needs - medical: None  . Transportation needs - non-medical: None  Occupational History  . Occupation: Worked for [employer]: [place] off in 11/11  Tobacco Use  . Smoking status: Former Smoker    Packs/day: 1.50    Years: 35.00    Pack years: 52.50    Types: Cigarettes    Start date: 06/30/1971    Last attempt to quit: 07/30/2010    Years since quitting: 6.9  . Smokeless tobacco: Former Systems developer    Types: [place] date: 03/26/1994  . Tobacco comment: currently uses a vape 11-06-2014.  Substance and Sexual Activity  . Alcohol use: No    Alcohol/week: 0.6 oz    Types: 1 Cans of beer per week    Comment: used "years ago"  . Drug use: No  . Sexual activity: No     Birth control/protection: None  Other Topics Concern  . None  Social History Narrative  . None   Outpatient Encounter Medications as of 06/23/2017  Medication Sig  . acarbose (PRECOSE) 50 MG tablet Take 50 mg 3 (three) times daily with meals by mouth.  Marland Kitchen amitriptyline (ELAVIL) 50 MG tablet Take 50 mg by mouth at bedtime.   Marland Kitchen aspirin 81 MG tablet Take 81 mg by mouth daily as needed for pain.  Marland Kitchen atorvastatin (LIPITOR) 20 MG tablet Take 20 mg by mouth daily.  Marland Kitchen CALCIUM PO Take 1 tablet by mouth daily.  . carvedilol (COREG) 12.5 MG tablet Take 1 tablet (12.5 mg total) by mouth 2 (two) times daily.  . furosemide (LASIX) 20 MG tablet TAKE (1) TABLET BY MOUTH ONCE DAILY.  Marland Kitchen gabapentin (NEURONTIN) 300 MG capsule Take 300 mg by mouth 3 (three) times daily.   Marland Kitchen glucose blood (ONETOUCH VERIO) test strip Use as instructed 4 x daily. E11.65  . insulin degludec (TRESIBA FLEXTOUCH) 100 UNIT/ML SOPN FlexTouch Pen Inject 0.2 mLs (20 Units total) daily at 10 pm into the skin.  Marland Kitchen L-Methylfolate-B6-B12 (FOLTANX) 3-35-2 MG TABS Take 1 tablet by mouth 2 (two) times daily.  Marland Kitchen lisinopril (PRINIVIL,ZESTRIL) 5 MG tablet TAKE ONE TABLET BY MOUTH ONCE DAILY.  Marland Kitchen LORazepam (ATIVAN) 1 MG tablet Take 1 mg by mouth 3 (three) times daily.   . metFORMIN (GLUCOPHAGE) 500 MG tablet Take 1 tablet (500 mg total) 2 (two) times daily with a meal by mouth.  . metoCLOPramide (REGLAN) 10 MG tablet TAKE 1 TABLET BY MOUTH 30 MINUTES BEFORE BREAKFAST AND EVENING MEAL DAILY.  . nitroGLYCERIN (NITROSTAT) 0.4 MG SL tablet Place 1 tablet (0.4 mg total) under the tongue every 5 (five) minutes as needed for chest pain.  Marland Kitchen omeprazole (PRILOSEC) 40 MG capsule TAKE (1) CAPSULE BY MOUTH ONCE DAILY.  Marland Kitchen oxycodone (ROXICODONE) 30 MG immediate release tablet Take 1-2 tablets by mouth 4 (four) times daily as needed for pain.   Marland Kitchen sucralfate (CARAFATE) 1 g tablet TAKE 1 TABLET BY MOUTH 30 MINTUES BEFORE MEALS AND AT BEDTIME. (Patient taking  differently: TAKE 1 TABLET BY MOUTH 30 MINTUES BEFORE EACH MEALS AND AT BEDTIME.)  . tamsulosin (FLOMAX) 0.4 MG CAPS capsule Take  0.4 mg daily by mouth.  . [DISCONTINUED] sucralfate (CARAFATE) 1 g tablet TAKE 1 TABLET BY MOUTH 30 MINTUES BEFORE MEALS AND AT BEDTIME.   No facility-administered encounter medications on file as of 06/23/2017.     ALLERGIES: No Known Allergies  VACCINATION STATUS:  There is no immunization history on file for this patient.  Diabetes  He presents for his follow-up diabetic visit. He has type 2 diabetes mellitus. Onset time: he was diagnosed at approximate age of 48 years. His disease course has been worsening. There are no hypoglycemic associated symptoms. Pertinent negatives for hypoglycemia include no confusion, headaches, pallor or seizures. Associated symptoms include foot paresthesias, polydipsia, polyuria and visual change. Pertinent negatives for diabetes include no chest pain, no fatigue, no polyphagia and no weakness. There are no hypoglycemic complications. Symptoms are worsening. Diabetic complications include a CVA. Risk factors for coronary artery disease include diabetes mellitus, dyslipidemia, family history, hypertension, male sex, obesity, sedentary lifestyle and tobacco exposure. Current diabetic treatment includes oral agent (dual therapy) (He is currently taking acarbose 50 mg by mouth 3 times a day and glimepiride 4 mmol by mouth twice a day). His weight is stable. He is following a generally unhealthy diet. When asked about meal planning, he reported none. He has not had a previous visit with a dietitian. He never participates in exercise. His breakfast blood glucose range is generally >200 mg/dl. His lunch blood glucose range is generally >200 mg/dl. His dinner blood glucose range is generally >200 mg/dl. His bedtime blood glucose range is generally >200 mg/dl. His overall blood glucose range is >200 mg/dl. (He came with average blood glucose of 245  for the last 7 days of 28 readings.) An ACE inhibitor/angiotensin II receptor blocker is being taken. He does not see a podiatrist.Eye exam is current.  Hyperlipidemia  This is a chronic problem. The current episode started more than 1 year ago. Exacerbating diseases include diabetes and obesity. Pertinent negatives include no chest pain, myalgias or shortness of breath. Current antihyperlipidemic treatment includes statins. Risk factors for coronary artery disease include diabetes mellitus, dyslipidemia, family history, hypertension, male sex, obesity and a sedentary lifestyle.  Hypertension  This is a chronic problem. The current episode started more than 1 year ago. Pertinent negatives include no chest pain, headaches, neck pain, palpitations or shortness of breath. Risk factors for coronary artery disease include diabetes mellitus, dyslipidemia, obesity, male gender, sedentary lifestyle and smoking/tobacco exposure. Past treatments include ACE inhibitors. Hypertensive end-organ damage includes CVA.      Review of Systems  Constitutional: Negative for chills, fatigue, fever and unexpected weight change.  HENT: Negative for dental problem, mouth sores and trouble swallowing.   Eyes: Negative for visual disturbance.  Respiratory: Negative for cough, choking, chest tightness, shortness of breath and wheezing.   Cardiovascular: Negative for chest pain, palpitations and leg swelling.  Gastrointestinal: Negative for abdominal distention, abdominal pain, constipation, diarrhea, nausea and vomiting.  Endocrine: Positive for polydipsia and polyuria. Negative for polyphagia.  Genitourinary: Negative for dysuria, flank pain, hematuria and urgency.  Musculoskeletal: Negative for back pain, gait problem, myalgias and neck pain.  Skin: Negative for pallor, rash and wound.  Neurological: Negative for seizures, syncope, weakness, numbness and headaches.  Psychiatric/Behavioral: Negative.  Negative for  confusion and dysphoric mood.    Objective:    BP 125/82   Pulse 94   Ht 6\' 1"  (1.854 m)   Wt 242 lb (109.8 kg)   BMI 31.93 kg/m   Wt Readings from  Last 3 Encounters:  06/23/17 242 lb (109.8 kg)  06/15/17 242 lb (109.8 kg)  01/13/17 228 lb (103.4 kg)     Physical Exam  Constitutional: He appears well-developed. He is cooperative. No distress.  HENT:  Head: Normocephalic and atraumatic.  Eyes: EOM are normal.  Neck: Normal range of motion. Neck supple. No tracheal deviation present. No thyromegaly present.  Cardiovascular: Normal rate, S1 normal, S2 normal and normal heart sounds. Exam reveals no gallop.  No murmur heard. Pulses:      Dorsalis pedis pulses are 1+ on the right side, and 1+ on the left side.       Posterior tibial pulses are 1+ on the right side, and 1+ on the left side.  Pulmonary/Chest: Breath sounds normal. No respiratory distress. He has no wheezes.  Abdominal: Soft. Bowel sounds are normal. He exhibits no distension. There is no tenderness. There is no guarding and no CVA tenderness.  Musculoskeletal: He exhibits no edema.       Right shoulder: He exhibits no swelling and no deformity.  Walks with a cane.  Neurological: He is alert. He has normal strength and normal reflexes. No cranial nerve deficit or sensory deficit. Gait normal.  Hard of hearing.  Skin: Skin is warm and dry. No rash noted. No cyanosis. Nails show no clubbing.  Psychiatric: His speech is normal. Cognition and memory are normal.  Reluctant affect. Disheveled and poor personal hygiene.    CMP ( most recent) CMP     Component Value Date/Time   NA 138 06/15/2017 1507   NA 138 09/03/2016 0835   K 4.2 06/15/2017 1507   CL 99 06/15/2017 1507   CO2 31 06/15/2017 1507   GLUCOSE 229 (H) 06/15/2017 1507   BUN 11 06/15/2017 1507   BUN 18 09/03/2016 0835   CREATININE 1.55 (H) 06/15/2017 1507   CALCIUM 9.7 06/15/2017 1507   PROT 6.8 06/15/2017 1507   ALBUMIN 4.6 12/27/2013 1446   AST 32  06/15/2017 1507   ALT 40 06/15/2017 1507   ALKPHOS 112 12/27/2013 1446   BILITOT 1.3 (H) 06/15/2017 1507   GFRNONAA 49 (L) 06/15/2017 1507   GFRAA 57 (L) 06/15/2017 1507     Diabetic Labs (most recent): Lab Results  Component Value Date   HGBA1C 7.0 (H) 06/15/2017   HGBA1C 5.2 03/15/2013   HGBA1C 6.5 (H) 03/07/2012     Lipid Panel ( most recent) Lipid Panel     Component Value Date/Time   CHOL 151 03/15/2013 0902   TRIG 446 (H) 03/15/2013 0902   HDL 27 (L) 03/15/2013 0902   CHOLHDL 5.6 03/15/2013 0902   VLDL UNABLE TO CALCULATE IF TRIGLYCERIDE OVER 400 mg/dL 03/15/2013 0902   LDLCALC UNABLE TO CALCULATE IF TRIGLYCERIDE OVER 400 mg/dL 03/15/2013 0902     Assessment & Plan:   1. DM type 2 causing vascular disease (HCC)   - Gustav L Moller has currently uncontrolled symptomatic type 2 DM since  56 years of age. - He returns with significant glycemic burden over the last 7 days averaging 245/28 readings, however his labs show A1c of 7%.  -his diabetes is complicated by  cerebrovascular accident, obesity/ sedentary life and Blu L Woodbury remains at a high risk for more acute and chronic complications which include CAD, CVA, CKD, retinopathy, and neuropathy. These are all discussed in detail with the patient.  - I have counseled him on diet management and  weight loss, by adopting a carbohydrate restricted/protein rich diet.  -  Suggestion is made for him to avoid simple carbohydrates  from his diet including Cakes, Sweet Desserts / Pastries, Ice Cream, Soda (diet and regular), Sweet Tea, Candies, Chips, Cookies, Store Bought Juices, Alcohol in Excess of  1-2 drinks a day, Artificial Sweeteners, and "Sugar-free" Products. This will help patient to have stable blood glucose profile and potentially avoid unintended weight gain.   - I encouraged him to switch to  unprocessed or minimally processed complex starch and increased protein intake (animal or plant source), fruits, and  vegetables.  - he is advised to stick to a routine mealtimes to eat 3 meals  a day and avoid unnecessary snacks ( to snack only to correct hypoglycemia).   - he will be scheduled with Jearld Fenton, RDN, CDE for individualized diabetes education- consult pending.  - I have approached him with the following individualized plan to manage diabetes and patient agrees:  - Based on his current glycemic burden, he will require at least every of insulin to control diabetes to target.  - He agrees with my plan to start Tresiba 20 units daily at bedtime (1 pen sample given from clinic). - I  will continue with  strict monitoring of glucose 4 times a day-before meals and at bedtime. - Patient is encouraged to call clinic for blood glucose levels less than 70 or above 300 mg /dl. - I will continue  acarbose 50 mg by mouth 3 times a day with meals, therapeutically suitable for patient . - I will discontinue  glimepiride, risk outweighs benefit for this patient. - He will also benefit from low-dose metformin, will initiate metformin 500 minute grams by mouth twice a day with meals.  - he will be considered for incretin therapy as appropriate next visit. - Patient specific target  A1c;  LDL, HDL, Triglycerides, and  Waist Circumference were discussed in detail.  2) BP/HTN:  Controlled. Continue current medications including ACEI/ARB. 3) Lipids/HPL:   Controlled unknown.   Patient is advised to continue statins. 4)  Weight/Diet: CDE Consult has been initiated , exercise, and detailed carbohydrates information provided.  5) Chronic Care/Health Maintenance:  -he  is on ACEI/ARB and Statin medications and  is encouraged to continue to follow up with Ophthalmology, Dentist,  Podiatrist at least yearly or according to recommendations, and advised to  stay away from smoking. I have recommended yearly flu vaccine and pneumonia vaccination at least every 5 years; moderate intensity exercise for up to 150 minutes  weekly; and  sleep for at least 7 hours a day.  - Time spent with the patient: 25 min, of which >50% was spent in reviewing his sugar logs , discussing his hypo- and hyper-glycemic episodes, reviewing his current and  previous labs and insulin doses and developing a plan to avoid hypo- and hyper-glycemia.    - I advised patient to maintain close follow up with Redmond School, MD for primary care needs.  Follow up plan: - Return in about 2 weeks (around 07/07/2017) for follow up with meter and logs- no labs.  Glade Lloyd, MD [facility] [address] [place], [place] [address] Phone: [phone]  Fax: [phone]    06/23/2017, 4:00 PM  This note was partially dictated with voice recognition software. Similar sounding words can be transcribed inadequately or may not  be corrected upon review.

## 2017-06-23 NOTE — Patient Instructions (Signed)

## 2017-06-27 DIAGNOSIS — L03031 Cellulitis of right toe: Secondary | ICD-10-CM | POA: Diagnosis not present

## 2017-06-27 DIAGNOSIS — E1140 Type 2 diabetes mellitus with diabetic neuropathy, unspecified: Secondary | ICD-10-CM | POA: Diagnosis not present

## 2017-06-27 DIAGNOSIS — E1151 Type 2 diabetes mellitus with diabetic peripheral angiopathy without gangrene: Secondary | ICD-10-CM | POA: Diagnosis not present

## 2017-06-27 DIAGNOSIS — L600 Ingrowing nail: Secondary | ICD-10-CM | POA: Diagnosis not present

## 2017-07-05 ENCOUNTER — Encounter: Payer: Medicare Other | Attending: "Endocrinology | Admitting: Nutrition

## 2017-07-05 VITALS — Ht 72.0 in | Wt 244.0 lb

## 2017-07-05 DIAGNOSIS — Z713 Dietary counseling and surveillance: Secondary | ICD-10-CM | POA: Diagnosis not present

## 2017-07-05 DIAGNOSIS — E1159 Type 2 diabetes mellitus with other circulatory complications: Secondary | ICD-10-CM | POA: Diagnosis not present

## 2017-07-05 NOTE — Progress Notes (Signed)
.   Medical Nutrition Therapy:  Appt start time: 3546 end time:  1630.   Assessment:  Primary concerns today: DIabetes. Lives with his wife. He does the cooking and shopping at home. Eats 3 meals per day.  Walks with a cane. HOH. He has had a CVA in past. 20 units Tresiba, Acarbose and Metformin. BS are 129- 244 mg before breakfast and before lunch 200's and before dinner 190's.  Sees Dr.  Dorris Fetch for Endocrinology. Lab Results  Component Value Date   HGBA1C 7.0 (H) 06/15/2017    Preferred Learning Style:    Auditory  Visual  Hands on  Learning Readiness:  Ready  Change in progress   MEDICATIONS:   DIETARY INTAKE:   24-hr recall:  B ( AM): 2% milk, dinabites  1 c. Snk ( AM): apple L ( PM): [place] sandwich on white bread, water  Snk ( PM):  D ( PM): [place], banana, water Snk ( PM):  Beverages: Water  Usual physical activity:  ADL but walks with a cane  Estimated energy needs: 1800 calories 200 g carbohydrates 135 g protein 50 g fat  Progress Towards Goal(s):  In progress.   Nutritional Diagnosis:  NB-1.1 Food and nutrition-related knowledge deficit As related to Diabetes.  As evidenced by A1C 7%.    Intervention:  Nutrition and Diabetes education provided on My Plate, CHO counting, meal planning, portion sizes, timing of meals, avoiding snacks between meals unless having a low blood sugar, target ranges for A1C and blood sugars, signs/symptoms and treatment of hyper/hypoglycemia, monitoring blood sugars, taking medications as prescribed, benefits of exercising 30 minutes per day and prevention of complications of DM.  Goals Follow My Plate Increase low carb vegetables -2 with lunch and dinner daily Drink 4-5 bottles of water per day Testing 4 times per day Take 20 units Tresiba at night Increase walk as tolerated Cut out snacks between meals Get A1C down to 7%   Teaching Method Utilized:  Visual Auditory Hands on  Handouts given during visit  include:  The Plate Method   Meal Plan  Diabetes Instructions.  Meal Plan Cared  Barriers to learning/adherence to lifestyle change: none  Demonstrated degree of understanding via:  Teach Back   Monitoring/Evaluation:  Dietary intake, exercise, meal planning, SBG, and body weight in 1 month(s).

## 2017-07-05 NOTE — Patient Instructions (Signed)
Goals Follow My Plate Increase low carb vegetables -2 with lunch and dinner daily Drink 4-5 bottles of water per day Testing 4 times per day Take 20 units Tresiba at night Increase walk as tolerated Cut out snacks between meals Get A1C down to 7%

## 2017-07-07 ENCOUNTER — Encounter: Payer: Self-pay | Admitting: Nutrition

## 2017-07-08 ENCOUNTER — Encounter (INDEPENDENT_AMBULATORY_CARE_PROVIDER_SITE_OTHER): Payer: Self-pay | Admitting: Internal Medicine

## 2017-07-08 NOTE — Telephone Encounter (Signed)
An appointment was given to the patient for 08/15/17 at 9:00am.  A letter was mailed to the patient.

## 2017-07-11 ENCOUNTER — Encounter: Payer: Medicare Other | Attending: Internal Medicine | Admitting: Nutrition

## 2017-07-11 ENCOUNTER — Encounter: Payer: Self-pay | Admitting: "Endocrinology

## 2017-07-11 ENCOUNTER — Ambulatory Visit (INDEPENDENT_AMBULATORY_CARE_PROVIDER_SITE_OTHER): Payer: Medicare Other | Admitting: "Endocrinology

## 2017-07-11 VITALS — Wt 242.0 lb

## 2017-07-11 VITALS — BP 119/87 | HR 106 | Ht 73.0 in | Wt 242.0 lb

## 2017-07-11 DIAGNOSIS — E1159 Type 2 diabetes mellitus with other circulatory complications: Secondary | ICD-10-CM

## 2017-07-11 DIAGNOSIS — E1129 Type 2 diabetes mellitus with other diabetic kidney complication: Secondary | ICD-10-CM | POA: Diagnosis not present

## 2017-07-11 DIAGNOSIS — Z713 Dietary counseling and surveillance: Secondary | ICD-10-CM | POA: Diagnosis not present

## 2017-07-11 DIAGNOSIS — IMO0002 Reserved for concepts with insufficient information to code with codable children: Secondary | ICD-10-CM

## 2017-07-11 DIAGNOSIS — I10 Essential (primary) hypertension: Secondary | ICD-10-CM | POA: Diagnosis not present

## 2017-07-11 DIAGNOSIS — Z6831 Body mass index (BMI) 31.0-31.9, adult: Secondary | ICD-10-CM | POA: Insufficient documentation

## 2017-07-11 DIAGNOSIS — E6609 Other obesity due to excess calories: Secondary | ICD-10-CM

## 2017-07-11 DIAGNOSIS — E782 Mixed hyperlipidemia: Secondary | ICD-10-CM

## 2017-07-11 DIAGNOSIS — E1165 Type 2 diabetes mellitus with hyperglycemia: Secondary | ICD-10-CM

## 2017-07-11 DIAGNOSIS — E118 Type 2 diabetes mellitus with unspecified complications: Secondary | ICD-10-CM

## 2017-07-11 MED ORDER — VITAMIN D3 125 MCG (5000 UT) PO CAPS: 5000.0000 [IU] | ORAL_CAPSULE | Freq: Every day | ORAL | 0 refills | Status: DC

## 2017-07-11 MED ORDER — INSULIN DEGLUDEC 100 UNIT/ML ~~LOC~~ SOPN: 30.0000 [IU] | PEN_INJECTOR | Freq: Every day | SUBCUTANEOUS | 2 refills | Status: DC

## 2017-07-11 NOTE — Progress Notes (Signed)
Consult Note       07/11/2017, 2:27 PM   Subjective:    Patient ID: Mike Floyd, male    DOB: September 12, 1960.  Mike Floyd is being seen in consultation for management of currently uncontrolled symptomatic diabetes requested by  Redmond School, MD.   Past Medical History:  Diagnosis Date  . AICD (automatic cardioverter/defibrillator) present 11/2007   a. 11/2007 [facility] Current VR - single lead ICD  . Anxiety   . Chest pain    a. 10/2007 Cath:  normal [place].  . CKD (chronic kidney disease), stage II   . DDD (degenerative disc disease), lumbar   . Diabetes mellitus DX: 2010  . Erosive esophagitis    a. per EGD (08/2011), Dr. Laural Golden - Erosive reflux esophagitis improved but not completely healed since previous EGD 3 years ago. Bx showing  ulcerated gatroesophageal junction mucosa. negative for H. pylori  . GERD (gastroesophageal reflux disease)   . Gout   . Hearing deficit    a. wear bilateral hearing aides  . History of hiatal hernia   . Hypertension   . Myocardial infarction ([place]) 2011  . Nonischemic dilated cardiomyopathy ([place])    a. H/O EF as low as 35-40% by LV gram 10/2007;  b. Echo 02/2011 EF 50-55%, inf HK, Gr 1 DD   . Renal insufficiency   . Sleep apnea    pt doesnt use, states"I cant afford one". PCP aware  . Stroke ([place])    mini-stroke in 2014  . TIA (transient ischemic attack)    July, 2013  . Tobacco abuse, in remission 06/27/2009   Discontinued in 2009    . Wears dentures    top plate  . WPW (Wolff-Parkinson-White syndrome)    a. s/p RFCA @ [facility]   Past Surgical History:  Procedure Laterality Date  . APPENDECTOMY    . BACK SURGERY     1995  . CARDIAC CATHETERIZATION     2009  . CARDIAC DEFIBRILLATOR PLACEMENT    . CHOLECYSTECTOMY    . CHOLECYSTECTOMY, LAPAROSCOPIC     11/2007  . COLONOSCOPY W/ POLYPECTOMY  2009  . ELBOW SURGERY Left 06/2010  . ESOPHAGEAL DILATION N/A  11/08/2014   Procedure: ESOPHAGEAL DILATION;  Surgeon: Rogene Houston, MD;  Location: [facility];  Service: Endoscopy;  Laterality: N/A;  #56,   . ESOPHAGOGASTRODUODENOSCOPY  03/31/2012   also 08/2011; [name]  . ESOPHAGOGASTRODUODENOSCOPY (EGD) WITH PROPOFOL N/A 11/08/2014   Procedure: ESOPHAGOGASTRODUODENOSCOPY (EGD) WITH PROPOFOL;  Surgeon: Rogene Houston, MD;  Location: [facility];  Service: Endoscopy;  Laterality: N/A;  [place] is 39 , [place] is 37  . ICD LEAD REMOVAL N/A 10/07/2016   Procedure: ICD LEAD REMOVAL ;  Surgeon: Evans Lance, MD;  Location: [facility];  Service: Cardiovascular;  Laterality: N/A;  . MULTIPLE TOOTH EXTRACTIONS    . [place]; performed at [facility] N/A 10/07/2016   Procedure: TRANSESOPHAGEAL ECHOCARDIOGRAM (TEE);  Surgeon: Evans Lance, MD;  Location: [facility];  Service: Cardiovascular;  Laterality: N/A;   Social History  Socioeconomic History  . Marital status: Married    Spouse name: None  . Number of children: None  . Years of education: None  . Highest education level: None  Social Needs  . Financial resource strain: None  . Food insecurity - worry: None  . Food insecurity - inability: None  . Transportation needs - medical: None  . Transportation needs - non-medical: None  Occupational History  . Occupation: Worked for [employer]: [place] off in 11/11  Tobacco Use  . Smoking status: Former Smoker    Packs/day: 1.50    Years: 35.00    Pack years: 52.50    Types: Cigarettes    Start date: 06/30/1971    Last attempt to quit: 07/30/2010    Years since quitting: 6.9  . Smokeless tobacco: Former Systems developer    Types: [facility] date: 03/26/1994  . Tobacco comment: currently uses a vape 11-06-2014.  Substance and Sexual Activity  . Alcohol use: No    Alcohol/week: 0.6 oz    Types: 1 Cans of beer per week    Comment: used "years ago"  . Drug use: No  . Sexual activity: No     Birth control/protection: None  Other Topics Concern  . None  Social History Narrative  . None   Outpatient Encounter Medications as of 07/11/2017  Medication Sig  . acarbose (PRECOSE) 50 MG tablet Take 50 mg 3 (three) times daily with meals by mouth.  Marland Kitchen amitriptyline (ELAVIL) 50 MG tablet Take 50 mg by mouth at bedtime.   Marland Kitchen aspirin 81 MG tablet Take 81 mg by mouth daily as needed for pain.  Marland Kitchen atorvastatin (LIPITOR) 20 MG tablet Take 20 mg by mouth daily.  Marland Kitchen CALCIUM PO Take 1 tablet by mouth daily.  . carvedilol (COREG) 12.5 MG tablet Take 1 tablet (12.5 mg total) by mouth 2 (two) times daily.  . Cholecalciferol (VITAMIN D3) 5000 units CAPS Take 1 capsule (5,000 Units total) by mouth daily.  . furosemide (LASIX) 20 MG tablet TAKE (1) TABLET BY MOUTH ONCE DAILY.  Marland Kitchen gabapentin (NEURONTIN) 300 MG capsule Take 300 mg by mouth 3 (three) times daily.   Marland Kitchen glucose blood (ONETOUCH VERIO) test strip Use as instructed 4 x daily. E11.65  . insulin degludec (TRESIBA FLEXTOUCH) 100 UNIT/ML SOPN FlexTouch Pen Inject 0.3 mLs (30 Units total) into the skin daily at 10 pm.  . L-Methylfolate-B6-B12 (FOLTANX) 3-35-2 MG TABS Take 1 tablet by mouth 2 (two) times daily.  Marland Kitchen lisinopril (PRINIVIL,ZESTRIL) 5 MG tablet TAKE ONE TABLET BY MOUTH ONCE DAILY. (Patient not taking: Reported on 07/05/2017)  . LORazepam (ATIVAN) 1 MG tablet Take 1 mg by mouth 3 (three) times daily.   . metFORMIN (GLUCOPHAGE) 500 MG tablet Take 1 tablet (500 mg total) 2 (two) times daily with a meal by mouth.  . metoCLOPramide (REGLAN) 10 MG tablet TAKE 1 TABLET BY MOUTH 30 MINUTES BEFORE BREAKFAST AND EVENING MEAL DAILY.  . nitroGLYCERIN (NITROSTAT) 0.4 MG SL tablet Place 1 tablet (0.4 mg total) under the tongue every 5 (five) minutes as needed for chest pain.  Marland Kitchen omeprazole (PRILOSEC) 40 MG capsule TAKE (1) CAPSULE BY MOUTH ONCE DAILY.  Marland Kitchen oxycodone (ROXICODONE) 30 MG immediate release tablet Take 1-2 tablets by mouth 4 (four) times daily  as needed for pain.   Marland Kitchen sucralfate (CARAFATE) 1 g tablet TAKE 1 TABLET BY MOUTH 30 MINTUES BEFORE MEALS AND AT BEDTIME. (Patient taking  differently: TAKE 1 TABLET BY MOUTH 30 MINTUES BEFORE EACH MEALS AND AT BEDTIME.)  . tamsulosin (FLOMAX) 0.4 MG CAPS capsule Take 0.4 mg daily by mouth.  . [DISCONTINUED] insulin degludec (TRESIBA FLEXTOUCH) 100 UNIT/ML SOPN FlexTouch Pen Inject 0.2 mLs (20 Units total) daily at 10 pm into the skin.   No facility-administered encounter medications on file as of 07/11/2017.     ALLERGIES: No Known Allergies  VACCINATION STATUS:  There is no immunization history on file for this patient.  Diabetes  He presents for his follow-up diabetic visit. He has type 2 diabetes mellitus. Onset time: he was diagnosed at approximate age of 36 years. His disease course has been improving. There are no hypoglycemic associated symptoms. Pertinent negatives for hypoglycemia include no confusion, headaches, pallor or seizures. Associated symptoms include foot paresthesias, polydipsia, polyuria and visual change. Pertinent negatives for diabetes include no chest pain, no fatigue, no polyphagia and no weakness. There are no hypoglycemic complications. Symptoms are improving. Diabetic complications include a CVA. Risk factors for coronary artery disease include diabetes mellitus, dyslipidemia, family history, hypertension, male sex, obesity, sedentary lifestyle and tobacco exposure. Current diabetic treatment includes oral agent (dual therapy) (He is currently taking acarbose 50 mg by mouth 3 times a day and glimepiride 4 mmol by mouth twice a day). His weight is stable. He is following a generally unhealthy diet. When asked about meal planning, he reported none. He has not had a previous visit with a dietitian. He never participates in exercise. His breakfast blood glucose range is generally 180-200 mg/dl. His lunch blood glucose range is generally 180-200 mg/dl. His dinner blood glucose  range is generally 180-200 mg/dl. His bedtime blood glucose range is generally 180-200 mg/dl. His overall blood glucose range is 180-200 mg/dl. An ACE inhibitor/angiotensin II receptor blocker is being taken. He does not see a podiatrist.Eye exam is current.  Hyperlipidemia  This is a chronic problem. The current episode started more than 1 year ago. Exacerbating diseases include diabetes and obesity. Pertinent negatives include no chest pain, myalgias or shortness of breath. Current antihyperlipidemic treatment includes statins. Risk factors for coronary artery disease include diabetes mellitus, dyslipidemia, family history, hypertension, male sex, obesity and a sedentary lifestyle.  Hypertension  This is a chronic problem. The current episode started more than 1 year ago. Pertinent negatives include no chest pain, headaches, neck pain, palpitations or shortness of breath. Risk factors for coronary artery disease include diabetes mellitus, dyslipidemia, obesity, male gender, sedentary lifestyle and smoking/tobacco exposure. Past treatments include ACE inhibitors. Hypertensive end-organ damage includes CVA.    Review of Systems  Constitutional: Negative for chills, fatigue, fever and unexpected weight change.  HENT: Negative for dental problem, mouth sores and trouble swallowing.   Eyes: Negative for visual disturbance.  Respiratory: Negative for cough, choking, chest tightness, shortness of breath and wheezing.   Cardiovascular: Negative for chest pain, palpitations and leg swelling.  Gastrointestinal: Negative for abdominal distention, abdominal pain, constipation, diarrhea, nausea and vomiting.  Endocrine: Positive for polydipsia and polyuria. Negative for polyphagia.  Genitourinary: Negative for dysuria, flank pain, hematuria and urgency.  Musculoskeletal: Negative for back pain, gait problem, myalgias and neck pain.  Skin: Negative for pallor, rash and wound.  Neurological: Negative for  seizures, syncope, weakness, numbness and headaches.  Psychiatric/Behavioral: Negative.  Negative for confusion and dysphoric mood.    Objective:    BP 119/87   Pulse (!) 106   Ht 6\' 1"  (1.854 m)   Wt 242 lb (109.8  kg)   BMI 31.93 kg/m   Wt Readings from Last 3 Encounters:  07/11/17 242 lb (109.8 kg)  07/05/17 244 lb (110.7 kg)  06/23/17 242 lb (109.8 kg)     Physical Exam  Constitutional: He appears well-developed. He is cooperative. No distress.  HENT:  Head: Normocephalic and atraumatic.  Eyes: EOM are normal.  Neck: Normal range of motion. Neck supple. No tracheal deviation present. No thyromegaly present.  Cardiovascular: Normal rate, S1 normal, S2 normal and normal heart sounds. Exam reveals no gallop.  No murmur heard. Pulses:      Dorsalis pedis pulses are 1+ on the right side, and 1+ on the left side.       Posterior tibial pulses are 1+ on the right side, and 1+ on the left side.  Pulmonary/Chest: Breath sounds normal. No respiratory distress. He has no wheezes.  Abdominal: Soft. Bowel sounds are normal. He exhibits no distension. There is no tenderness. There is no guarding and no CVA tenderness.  Musculoskeletal: He exhibits no edema.       Right shoulder: He exhibits no swelling and no deformity.  Walks with a cane.  Neurological: He is alert. He has normal strength and normal reflexes. No cranial nerve deficit or sensory deficit. Gait normal.  Hard of hearing.  Skin: Skin is warm and dry. No rash noted. No cyanosis. Nails show no clubbing.  Psychiatric: His speech is normal. Cognition and memory are normal.  Reluctant affect. Disheveled and poor personal hygiene.    CMP ( most recent) CMP     Component Value Date/Time   NA 138 06/15/2017 1507   NA 138 09/03/2016 0835   K 4.2 06/15/2017 1507   CL 99 06/15/2017 1507   CO2 31 06/15/2017 1507   GLUCOSE 229 (H) 06/15/2017 1507   BUN 11 06/15/2017 1507   BUN 18 09/03/2016 0835   CREATININE 1.55 (H)  06/15/2017 1507   CALCIUM 9.7 06/15/2017 1507   PROT 6.8 06/15/2017 1507   ALBUMIN 4.6 12/27/2013 1446   AST 32 06/15/2017 1507   ALT 40 06/15/2017 1507   ALKPHOS 112 12/27/2013 1446   BILITOT 1.3 (H) 06/15/2017 1507   GFRNONAA 49 (L) 06/15/2017 1507   GFRAA 57 (L) 06/15/2017 1507     Diabetic Labs (most recent): Lab Results  Component Value Date   HGBA1C 7.0 (H) 06/15/2017   HGBA1C 5.2 03/15/2013   HGBA1C 6.5 (H) 03/07/2012     Lipid Panel ( most recent) Lipid Panel     Component Value Date/Time   CHOL 151 03/15/2013 0902   TRIG 446 (H) 03/15/2013 0902   HDL 27 (L) 03/15/2013 0902   CHOLHDL 5.6 03/15/2013 0902   VLDL UNABLE TO CALCULATE IF TRIGLYCERIDE OVER 400 mg/dL 03/15/2013 0902   LDLCALC UNABLE TO CALCULATE IF TRIGLYCERIDE OVER 400 mg/dL 03/15/2013 0902     Assessment & Plan:   1. DM type 2 causing vascular disease ([place])   - Mike Floyd has currently uncontrolled symptomatic type 2 DM since  56 years of age. - He returns with significant improvement in his glycemic profile since last visit, his recent labs showed A1c of 7%. This was a sharp discrepancy between glycemic profile and A1c.  -his diabetes is complicated by  cerebrovascular accident, obesity/ sedentary life and Mike Floyd remains at a high risk for more acute and chronic complications which include CAD, CVA, CKD, retinopathy, and neuropathy. These are all discussed in detail with the patient.  -  I have counseled him on diet management and  weight loss, by adopting a carbohydrate restricted/protein rich diet.  -  Suggestion is made for him to avoid simple carbohydrates  from his diet including Cakes, Sweet Desserts / Pastries, Ice Cream, Soda (diet and regular), Sweet Tea, Candies, Chips, Cookies, Store Bought Juices, Alcohol in Excess of  1-2 drinks a day, Artificial Sweeteners, and "Sugar-free" Products. This will help patient to have stable blood glucose profile and potentially avoid unintended  weight gain.  - I encouraged him to switch to  unprocessed or minimally processed complex starch and increased protein intake (animal or plant source), fruits, and vegetables.  - he is advised to stick to a routine mealtimes to eat 3 meals  a day and avoid unnecessary snacks ( to snack only to correct hypoglycemia).   - he will be scheduled with Jearld Fenton, RDN, CDE for individualized diabetes education- consult in progress.  - I have approached him with the following individualized plan to manage diabetes and patient agrees:  - Based on his current glycemic burden, he will continue to require at least basal  insulin to control diabetes to target.  - Based on his glycemic profile since last visit, he will not need prandial insulin for now. - He agrees with my plan to increase Tresiba to 30 units daily at bedtime, associated with monitoring of blood glucose 2 times a day, before breakfast and at bedtime. - Patient is encouraged to call clinic for blood glucose levels less than 70 or above 300 mg /dl. - I will continue  acarbose 50 mg by mouth 3 times a day with meals, therapeutically suitable for patient . - He will also benefit from low-dose metformin, will continue metformin 500 mg by mouth twice a day with meals.  - he will be considered for incretin therapy as appropriate next visit. - Patient specific target  A1c;  LDL, HDL, Triglycerides, and  Waist Circumference were discussed in detail.  2) BP/HTN:  Controlled. I advised him to continue current medications including ACEI/ARB. 3) Lipids/HPL:   Controlled unknown.   Patient is advised to continue statins. 4)  Weight/Diet: CDE Consult has been initiated , exercise, and detailed carbohydrates information provided.  5) vitamin D deficiency: I discussed and initiated vitamin D3 5000 units daily for the next 90 days.  6) Chronic Care/Health Maintenance:  -he  is on ACEI/ARB and Statin medications and  is encouraged to continue to  follow up with Ophthalmology, Dentist,  Podiatrist at least yearly or according to recommendations, and advised to  stay away from smoking. I have recommended yearly flu vaccine and pneumonia vaccination at least every 5 years; moderate intensity exercise for up to 150 minutes weekly; and  sleep for at least 7 hours a day.  - I advised patient to maintain close follow up with Redmond School, MD for primary care needs. - Time spent with the patient: 25 min, of which >50% was spent in reviewing his sugar logs , discussing his hypo- and hyper-glycemic episodes, reviewing his current and  previous labs and insulin doses and developing a plan to avoid hypo- and hyper-glycemia.   Follow up plan: - Return in about 3 months (around 10/09/2017) for follow up with pre-visit labs, meter, and logs.  Glade Lloyd, MD [facility] [address] [place], [place] [address] Phone: [phone]  Fax: [phone]    07/11/2017, 2:27 PM  This note was partially dictated with voice recognition software.  Similar sounding words can be transcribed inadequately or may not  be corrected upon review.

## 2017-07-11 NOTE — Patient Instructions (Signed)

## 2017-07-11 NOTE — Patient Instructions (Addendum)
Goals Follow My Plate Increase low carb vegetables -2 with lunch and dinner daily Drink 4-5 bottles of water per day Testing 2  times per day Take 30 units Tresiba at night Increase walking as tolerated Cut out snacks between meals Get A1C down to less than 7%

## 2017-07-11 NOTE — Progress Notes (Signed)
.   Medical Nutrition Therapy:  Appt start time: 4098 end time:  1530   Assessment:  Primary concerns today: DIabetes. Lives with his wife. He does the cooking and shopping at home. Eats 3 meals per day.  Walks with a cane. HOH. He has had a CVA in past. 20 units Tresiba, Acarbose and Metformin. BS are 150's  Meals..  Sees Dr.  Dorris Fetch for Endocrinology. No low blood sugars Eating better balanced meals. Still working on getting rid of diet sodas. Drinking more water. Has cut out snacks between meals. He will increase Tresiba to 30 units today per Dr. Dorris Fetch. Doing much better and trying to make better food choices.   Lab Results  Component Value Date   HGBA1C 7.0 (H) 06/15/2017    Preferred Learning Style:    Auditory  Visual  Hands on  Learning Readiness:  Ready  Change in progress   MEDICATIONS:   DIETARY INTAKE:   24-hr recall:  B ( AM): Cheerios or wheaties, milk, eggs Snk ( AM): apple L ( PM): Banana sandwich, water, Diet Mt Dew  Snk ( PM):  D ( PM):  Meat and vegetables, water. Snk ( PM):  Beverages: Water  Usual physical activity:  ADL but walks with a cane  Estimated energy needs: 1800 calories 200 g carbohydrates 135 g protein 50 g fat  Progress Towards Goal(s):  In progress.   Nutritional Diagnosis:  NB-1.1 Food and nutrition-related knowledge deficit As related to Diabetes.  As evidenced by A1C 7%.    Intervention:  Nutrition and Diabetes education provided on My Plate, CHO counting, meal planning, portion sizes, timing of meals, avoiding snacks between meals unless having a low blood sugar, target ranges for A1C and blood sugars, signs/symptoms and treatment of hyper/hypoglycemia, monitoring blood sugars, taking medications as prescribed, benefits of exercising 30 minutes per day and prevention of complications of DM.  Goals Follow My Plate Increase low carb vegetables -2 with lunch and dinner daily Drink 4-5 bottles of water per day Testing  2  times per day Take 30 units Tresiba at night Increase walking as tolerated Cut out snacks between meals Get A1C down to less than 7%    Teaching Method Utilized:  Visual Auditory Hands on  Handouts given during visit include:  The Plate Method   Meal Plan  Diabetes Instructions.  Meal Plan Cared  Barriers to learning/adherence to lifestyle change: none  Demonstrated degree of understanding via:  Teach Back   Monitoring/Evaluation:  Dietary intake, exercise, meal planning, SBG, and body weight in 1 month(s).

## 2017-07-12 ENCOUNTER — Encounter: Payer: Self-pay | Admitting: Nutrition

## 2017-07-13 ENCOUNTER — Other Ambulatory Visit: Payer: Self-pay | Admitting: Cardiovascular Disease

## 2017-08-15 ENCOUNTER — Encounter (INDEPENDENT_AMBULATORY_CARE_PROVIDER_SITE_OTHER): Payer: Self-pay

## 2017-08-15 ENCOUNTER — Telehealth: Payer: Self-pay | Admitting: "Endocrinology

## 2017-08-15 ENCOUNTER — Encounter (INDEPENDENT_AMBULATORY_CARE_PROVIDER_SITE_OTHER): Payer: Self-pay | Admitting: Internal Medicine

## 2017-08-15 ENCOUNTER — Ambulatory Visit (INDEPENDENT_AMBULATORY_CARE_PROVIDER_SITE_OTHER): Payer: Medicare Other | Admitting: Internal Medicine

## 2017-08-15 VITALS — BP 106/78 | HR 84 | Temp 97.8°F | Ht 73.0 in | Wt 247.2 lb

## 2017-08-15 DIAGNOSIS — R1319 Other dysphagia: Secondary | ICD-10-CM

## 2017-08-15 DIAGNOSIS — R1310 Dysphagia, unspecified: Secondary | ICD-10-CM

## 2017-08-15 DIAGNOSIS — K219 Gastro-esophageal reflux disease without esophagitis: Secondary | ICD-10-CM

## 2017-08-15 NOTE — Patient Instructions (Signed)
Continue the Omeprazole.  OV in 1 year.  

## 2017-08-15 NOTE — Telephone Encounter (Signed)
Mike Floyd is calling stating that his blood sugar is running high  Jan 4th AM-149 PM-327  Jan 5th AM-209 PM-365  Jan 6th AM-277 PM-371  Jan 7th AM -286  PLEASE ADVISE OF ANY RECOMMENDATIONS

## 2017-08-15 NOTE — Telephone Encounter (Signed)
Advise him to increase Toujeo to 40 units qhs, call back if >200 x 3.

## 2017-08-15 NOTE — Telephone Encounter (Signed)
Pt.notified

## 2017-08-15 NOTE — Progress Notes (Signed)
Subjective:    Patient ID: Mike Floyd, male    DOB: 07/25/1961, 57 y.o.   MRN: [id number]  HPI Here today for f/u. Last seen in 2016. Hx of GERD and dysphagia. Underwent and EGD/ED in 2016 which revealed Erosive reflux esophagitis without ring or stricture formation. Significant improvement in severity of esophagitis since last EGD of August 2013. Small sliding hiatal hernia. Moderate amount of food debris in the stomach suggestive of gastroparesis. Esophagus dilated by passing 56 French Maloney dilator but no mucosal disruption induced. He tells me he is doing fair. His appetite is good. He has gained weight. He is trying to lose weight. Occasionally has trouble swallowing pills.  No trouble with foods. No side effects from the Reglan. GERD controlled with Omeprazole.  His BM daily. No melena or BRRB.  Blood sugars running over 200 in am  Blood sugars 200-400 in the pm.   Emptying study in 2016 revealed IMPRESSION: Normal study demonstrating delay in gastric emptying  Maintained on Reglan BID.    Hx of nonischemic cardiomyopathy . Hx of diabetes and GERD.   Review of Systems Past Medical History:  Diagnosis Date  . AICD (automatic cardioverter/defibrillator) present 11/2007   a. 11/2007 S[facility] Current VR - single lead ICD  . Anxiety   . Chest pain    a. 10/2007 Cath:  normal Cors.  . CKD (chronic kidney disease), stage II   . DDD (degenerative disc disease), lumbar   . Diabetes mellitus DX: 2010  . Erosive esophagitis    a. per EGD (08/2011), Dr. Laural Golden - Erosive reflux esophagitis improved but not completely healed since previous EGD 3 years ago. Bx showing  ulcerated gatroesophageal junction mucosa. negative for H. pylori  . GERD (gastroesophageal reflux disease)   . Gout   . Hearing deficit    a. wear bilateral hearing aides  . History of hiatal hernia   . Hypertension   . Myocardial infarction ([place]) 2011  . Nonischemic dilated cardiomyopathy ([place])    a. H/O EF as low as  35-40% by LV gram 10/2007;  b. Echo 02/2011 EF 50-55%, inf HK, Gr 1 DD   . Renal insufficiency   . Sleep apnea    pt doesnt use, states"I cant afford one". PCP aware  . Stroke ([place])    mini-stroke in 2014  . TIA (transient ischemic attack)    July, 2013  . Tobacco abuse, in remission 06/27/2009   Discontinued in 2009    . Wears dentures    top plate  . WPW (Wolff-Parkinson-White syndrome)    a. s/p RFCA @ [facility]    Past Surgical History:  Procedure Laterality Date  . APPENDECTOMY    . BACK SURGERY     1995  . CARDIAC CATHETERIZATION     2009  . CARDIAC DEFIBRILLATOR PLACEMENT    . CHOLECYSTECTOMY    . CHOLECYSTECTOMY, LAPAROSCOPIC     11/2007  . COLONOSCOPY W/ POLYPECTOMY  2009  . ELBOW SURGERY Left 06/2010  . ESOPHAGEAL DILATION N/A 11/08/2014   Procedure: ESOPHAGEAL DILATION;  Surgeon: Rogene Houston, MD;  Location: [facility];  Service: Endoscopy;  Laterality: N/A;  #56,   . ESOPHAGOGASTRODUODENOSCOPY  03/31/2012   also 08/2011; [name]  . ESOPHAGOGASTRODUODENOSCOPY (EGD) WITH PROPOFOL N/A 11/08/2014   Procedure: ESOPHAGOGASTRODUODENOSCOPY (EGD) WITH PROPOFOL;  Surgeon: Rogene Houston, MD;  Location: [facility];  Service: Endoscopy;  Laterality: N/A;  Hiatus is 39 , GE Junction is 37  . ICD  LEAD REMOVAL N/A 10/07/2016   Procedure: ICD LEAD REMOVAL ;  Surgeon: Evans Lance, MD;  Location: [place];  Service: Cardiovascular;  Laterality: N/A;  . MULTIPLE TOOTH EXTRACTIONS    . [place]; performed at [place] N/A 10/07/2016   Procedure: TRANSESOPHAGEAL ECHOCARDIOGRAM (TEE);  Surgeon: Evans Lance, MD;  Location: [facility] OR;  Service: Cardiovascular;  Laterality: N/A;    No Known Allergies  Current Outpatient Medications on File Prior to Visit  Medication Sig Dispense Refill  . acarbose (PRECOSE) 50 MG tablet Take 50 mg 3 (three) times daily with meals by mouth.    Marland Kitchen amitriptyline (ELAVIL) 50 MG tablet Take 50 mg by mouth at  bedtime.     Marland Kitchen aspirin 81 MG tablet Take 81 mg by mouth daily as needed for pain.    Marland Kitchen atorvastatin (LIPITOR) 20 MG tablet Take 20 mg by mouth daily.    Marland Kitchen CALCIUM PO Take 1 tablet by mouth daily.    . carvedilol (COREG) 25 MG tablet TAKE 2 TABLETS BY MOUTH TWICE DAILY. 360 tablet 0  . Cholecalciferol (VITAMIN D3) 5000 units CAPS Take 1 capsule (5,000 Units total) by mouth daily. 90 capsule 0  . furosemide (LASIX) 20 MG tablet TAKE (1) TABLET BY MOUTH ONCE DAILY. 90 tablet 3  . gabapentin (NEURONTIN) 300 MG capsule Take 300 mg by mouth 3 (three) times daily.     Marland Kitchen glucose blood (ONETOUCH VERIO) test strip Use as instructed 4 x daily. E11.65 150 each 5  . insulin degludec (TRESIBA FLEXTOUCH) 100 UNIT/ML SOPN FlexTouch Pen Inject 0.3 mLs (30 Units total) into the skin daily at 10 pm. 5 pen 2  . L-Methylfolate-B6-B12 (FOLTANX) 3-35-2 MG TABS Take 1 tablet by mouth 2 (two) times daily.    Marland Kitchen lisinopril (PRINIVIL,ZESTRIL) 5 MG tablet TAKE ONE TABLET BY MOUTH ONCE DAILY. 90 tablet 0  . LORazepam (ATIVAN) 1 MG tablet Take 1 mg by mouth 3 (three) times daily.     . metFORMIN (GLUCOPHAGE) 500 MG tablet Take 1 tablet (500 mg total) 2 (two) times daily with a meal by mouth. 60 tablet 2  . metoCLOPramide (REGLAN) 10 MG tablet TAKE 1 TABLET BY MOUTH 30 MINUTES BEFORE BREAKFAST AND EVENING MEAL DAILY. 60 tablet 2  . omeprazole (PRILOSEC) 40 MG capsule TAKE (1) CAPSULE BY MOUTH ONCE DAILY. 30 capsule 6  . oxycodone (ROXICODONE) 30 MG immediate release tablet Take 1-2 tablets by mouth 4 (four) times daily as needed for pain.     Marland Kitchen sucralfate (CARAFATE) 1 g tablet TAKE 1 TABLET BY MOUTH 30 MINTUES BEFORE MEALS AND AT BEDTIME. (Patient taking differently: TAKE 1 TABLET BY MOUTH 30 MINTUES BEFORE EACH MEALS AND AT BEDTIME.) 120 tablet 5  . tamsulosin (FLOMAX) 0.4 MG CAPS capsule Take 0.4 mg daily by mouth.    . carvedilol (COREG) 12.5 MG tablet Take 1 tablet (12.5 mg total) by mouth 2 (two) times daily. 180 tablet 3    . nitroGLYCERIN (NITROSTAT) 0.4 MG SL tablet Place 1 tablet (0.4 mg total) under the tongue every 5 (five) minutes as needed for chest pain. 25 tablet 3   No current facility-administered medications on file prior to visit.         Objective:   Physical Exam Blood pressure 106/78, pulse 84, temperature 97.8 F (36.6 C), height 6\' 1"  (1.854 m), weight 247 lb 3.2 oz (112.1 kg). Alert and oriented. Skin warm and  dry. Oral mucosa is moist.   . Sclera anicteric, conjunctivae is pink. Thyroid not enlarged. No cervical lymphadenopathy. Lungs clear. Heart regular rate and rhythm.  Abdomen is soft. Bowel sounds are positive. No hepatomegaly. No abdominal masses felt. No tenderness.  No edema to lower extremities.           Assessment & Plan:  GERD. Controlled with with Omeprazole.  Dysphagia: Occasionally has trouble with pills. No problems with pills.  OV in 1 year.

## 2017-09-13 DIAGNOSIS — E1151 Type 2 diabetes mellitus with diabetic peripheral angiopathy without gangrene: Secondary | ICD-10-CM | POA: Diagnosis not present

## 2017-09-13 DIAGNOSIS — L03031 Cellulitis of right toe: Secondary | ICD-10-CM | POA: Diagnosis not present

## 2017-09-13 DIAGNOSIS — L600 Ingrowing nail: Secondary | ICD-10-CM | POA: Diagnosis not present

## 2017-09-13 DIAGNOSIS — E1140 Type 2 diabetes mellitus with diabetic neuropathy, unspecified: Secondary | ICD-10-CM | POA: Diagnosis not present

## 2017-09-22 ENCOUNTER — Other Ambulatory Visit (INDEPENDENT_AMBULATORY_CARE_PROVIDER_SITE_OTHER): Payer: Self-pay | Admitting: Internal Medicine

## 2017-09-22 ENCOUNTER — Other Ambulatory Visit: Payer: Self-pay | Admitting: "Endocrinology

## 2017-09-27 DIAGNOSIS — E1140 Type 2 diabetes mellitus with diabetic neuropathy, unspecified: Secondary | ICD-10-CM | POA: Diagnosis not present

## 2017-09-27 DIAGNOSIS — E1151 Type 2 diabetes mellitus with diabetic peripheral angiopathy without gangrene: Secondary | ICD-10-CM | POA: Diagnosis not present

## 2017-09-27 DIAGNOSIS — L89892 Pressure ulcer of other site, stage 2: Secondary | ICD-10-CM | POA: Diagnosis not present

## 2017-10-03 DIAGNOSIS — Z950 Presence of cardiac pacemaker: Secondary | ICD-10-CM | POA: Diagnosis not present

## 2017-10-03 DIAGNOSIS — M1991 Primary osteoarthritis, unspecified site: Secondary | ICD-10-CM | POA: Diagnosis not present

## 2017-10-03 DIAGNOSIS — E1140 Type 2 diabetes mellitus with diabetic neuropathy, unspecified: Secondary | ICD-10-CM | POA: Diagnosis not present

## 2017-10-03 DIAGNOSIS — E118 Type 2 diabetes mellitus with unspecified complications: Secondary | ICD-10-CM | POA: Diagnosis not present

## 2017-10-03 DIAGNOSIS — K219 Gastro-esophageal reflux disease without esophagitis: Secondary | ICD-10-CM | POA: Diagnosis not present

## 2017-10-03 DIAGNOSIS — G894 Chronic pain syndrome: Secondary | ICD-10-CM | POA: Diagnosis not present

## 2017-10-03 DIAGNOSIS — N183 Chronic kidney disease, stage 3 (moderate): Secondary | ICD-10-CM | POA: Diagnosis not present

## 2017-10-03 DIAGNOSIS — R319 Hematuria, unspecified: Secondary | ICD-10-CM | POA: Diagnosis not present

## 2017-10-04 DIAGNOSIS — E1129 Type 2 diabetes mellitus with other diabetic kidney complication: Secondary | ICD-10-CM | POA: Diagnosis not present

## 2017-10-10 ENCOUNTER — Ambulatory Visit: Payer: Medicare Other | Admitting: "Endocrinology

## 2017-10-10 ENCOUNTER — Ambulatory Visit: Payer: Medicare Other | Admitting: Nutrition

## 2017-10-26 ENCOUNTER — Other Ambulatory Visit: Payer: Self-pay | Admitting: "Endocrinology

## 2017-11-15 DIAGNOSIS — L03031 Cellulitis of right toe: Secondary | ICD-10-CM | POA: Diagnosis not present

## 2017-11-15 DIAGNOSIS — E1151 Type 2 diabetes mellitus with diabetic peripheral angiopathy without gangrene: Secondary | ICD-10-CM | POA: Diagnosis not present

## 2017-11-15 DIAGNOSIS — L600 Ingrowing nail: Secondary | ICD-10-CM | POA: Diagnosis not present

## 2017-11-15 DIAGNOSIS — E1140 Type 2 diabetes mellitus with diabetic neuropathy, unspecified: Secondary | ICD-10-CM | POA: Diagnosis not present

## 2017-11-22 ENCOUNTER — Other Ambulatory Visit (INDEPENDENT_AMBULATORY_CARE_PROVIDER_SITE_OTHER): Payer: Self-pay | Admitting: Internal Medicine

## 2017-11-29 DIAGNOSIS — E1151 Type 2 diabetes mellitus with diabetic peripheral angiopathy without gangrene: Secondary | ICD-10-CM | POA: Diagnosis not present

## 2017-11-29 DIAGNOSIS — L89892 Pressure ulcer of other site, stage 2: Secondary | ICD-10-CM | POA: Diagnosis not present

## 2017-11-29 DIAGNOSIS — E1140 Type 2 diabetes mellitus with diabetic neuropathy, unspecified: Secondary | ICD-10-CM | POA: Diagnosis not present

## 2017-12-29 ENCOUNTER — Other Ambulatory Visit (HOSPITAL_COMMUNITY): Payer: Self-pay | Admitting: Internal Medicine

## 2017-12-29 ENCOUNTER — Ambulatory Visit (HOSPITAL_COMMUNITY)
Admission: RE | Admit: 2017-12-29 | Discharge: 2017-12-29 | Disposition: A | Discharge status: Home / Self Care | Payer: Medicare Other | Source: Ambulatory Visit | Attending: Internal Medicine | Admitting: Internal Medicine

## 2017-12-29 DIAGNOSIS — G894 Chronic pain syndrome: Secondary | ICD-10-CM | POA: Diagnosis not present

## 2017-12-29 DIAGNOSIS — R0600 Dyspnea, unspecified: Secondary | ICD-10-CM

## 2017-12-29 DIAGNOSIS — I5033 Acute on chronic diastolic (congestive) heart failure: Secondary | ICD-10-CM | POA: Diagnosis not present

## 2017-12-29 DIAGNOSIS — Z23 Encounter for immunization: Secondary | ICD-10-CM | POA: Diagnosis not present

## 2017-12-29 DIAGNOSIS — Z1389 Encounter for screening for other disorder: Secondary | ICD-10-CM | POA: Diagnosis not present

## 2017-12-29 DIAGNOSIS — K219 Gastro-esophageal reflux disease without esophagitis: Secondary | ICD-10-CM | POA: Diagnosis not present

## 2017-12-29 DIAGNOSIS — E119 Type 2 diabetes mellitus without complications: Secondary | ICD-10-CM | POA: Diagnosis not present

## 2017-12-29 DIAGNOSIS — R0789 Other chest pain: Secondary | ICD-10-CM | POA: Diagnosis not present

## 2018-01-03 DIAGNOSIS — E1140 Type 2 diabetes mellitus with diabetic neuropathy, unspecified: Secondary | ICD-10-CM | POA: Diagnosis not present

## 2018-01-03 DIAGNOSIS — E1151 Type 2 diabetes mellitus with diabetic peripheral angiopathy without gangrene: Secondary | ICD-10-CM | POA: Diagnosis not present

## 2018-01-03 DIAGNOSIS — L89892 Pressure ulcer of other site, stage 2: Secondary | ICD-10-CM | POA: Diagnosis not present

## 2018-01-10 DIAGNOSIS — Z0000 Encounter for general adult medical examination without abnormal findings: Secondary | ICD-10-CM | POA: Diagnosis not present

## 2018-01-17 ENCOUNTER — Other Ambulatory Visit: Payer: Self-pay | Admitting: Cardiovascular Disease

## 2018-02-21 ENCOUNTER — Other Ambulatory Visit: Payer: Self-pay | Admitting: Internal Medicine

## 2018-02-28 DIAGNOSIS — L600 Ingrowing nail: Secondary | ICD-10-CM | POA: Diagnosis not present

## 2018-02-28 DIAGNOSIS — E1151 Type 2 diabetes mellitus with diabetic peripheral angiopathy without gangrene: Secondary | ICD-10-CM | POA: Diagnosis not present

## 2018-02-28 DIAGNOSIS — E1140 Type 2 diabetes mellitus with diabetic neuropathy, unspecified: Secondary | ICD-10-CM | POA: Diagnosis not present

## 2018-02-28 DIAGNOSIS — L03031 Cellulitis of right toe: Secondary | ICD-10-CM | POA: Diagnosis not present

## 2018-03-16 ENCOUNTER — Other Ambulatory Visit: Payer: Self-pay | Admitting: Cardiovascular Disease

## 2018-03-27 ENCOUNTER — Other Ambulatory Visit: Payer: Self-pay | Admitting: "Endocrinology

## 2018-03-31 DIAGNOSIS — I10 Essential (primary) hypertension: Secondary | ICD-10-CM | POA: Diagnosis not present

## 2018-03-31 DIAGNOSIS — Z0000 Encounter for general adult medical examination without abnormal findings: Secondary | ICD-10-CM | POA: Diagnosis not present

## 2018-03-31 DIAGNOSIS — I43 Cardiomyopathy in diseases classified elsewhere: Secondary | ICD-10-CM | POA: Diagnosis not present

## 2018-03-31 DIAGNOSIS — Z1389 Encounter for screening for other disorder: Secondary | ICD-10-CM | POA: Diagnosis not present

## 2018-03-31 DIAGNOSIS — K219 Gastro-esophageal reflux disease without esophagitis: Secondary | ICD-10-CM | POA: Diagnosis not present

## 2018-03-31 DIAGNOSIS — L739 Follicular disorder, unspecified: Secondary | ICD-10-CM | POA: Diagnosis not present

## 2018-03-31 DIAGNOSIS — E1140 Type 2 diabetes mellitus with diabetic neuropathy, unspecified: Secondary | ICD-10-CM | POA: Diagnosis not present

## 2018-04-04 ENCOUNTER — Other Ambulatory Visit (HOSPITAL_COMMUNITY): Payer: Self-pay | Admitting: Internal Medicine

## 2018-04-04 DIAGNOSIS — I739 Peripheral vascular disease, unspecified: Secondary | ICD-10-CM

## 2018-04-18 DIAGNOSIS — E1140 Type 2 diabetes mellitus with diabetic neuropathy, unspecified: Secondary | ICD-10-CM | POA: Diagnosis not present

## 2018-04-18 DIAGNOSIS — L89892 Pressure ulcer of other site, stage 2: Secondary | ICD-10-CM | POA: Diagnosis not present

## 2018-04-18 DIAGNOSIS — E1151 Type 2 diabetes mellitus with diabetic peripheral angiopathy without gangrene: Secondary | ICD-10-CM | POA: Diagnosis not present

## 2018-04-25 DIAGNOSIS — M25579 Pain in unspecified ankle and joints of unspecified foot: Secondary | ICD-10-CM | POA: Diagnosis not present

## 2018-04-25 DIAGNOSIS — E1151 Type 2 diabetes mellitus with diabetic peripheral angiopathy without gangrene: Secondary | ICD-10-CM | POA: Diagnosis not present

## 2018-04-25 DIAGNOSIS — E1140 Type 2 diabetes mellitus with diabetic neuropathy, unspecified: Secondary | ICD-10-CM | POA: Diagnosis not present

## 2018-04-25 DIAGNOSIS — L03031 Cellulitis of right toe: Secondary | ICD-10-CM | POA: Diagnosis not present

## 2018-05-26 ENCOUNTER — Other Ambulatory Visit (INDEPENDENT_AMBULATORY_CARE_PROVIDER_SITE_OTHER): Payer: Self-pay | Admitting: Internal Medicine

## 2018-06-12 ENCOUNTER — Other Ambulatory Visit: Payer: Self-pay | Admitting: "Endocrinology

## 2018-06-21 DIAGNOSIS — G894 Chronic pain syndrome: Secondary | ICD-10-CM | POA: Diagnosis not present

## 2018-06-21 DIAGNOSIS — K219 Gastro-esophageal reflux disease without esophagitis: Secondary | ICD-10-CM | POA: Diagnosis not present

## 2018-06-21 DIAGNOSIS — E119 Type 2 diabetes mellitus without complications: Secondary | ICD-10-CM | POA: Diagnosis not present

## 2018-07-03 DIAGNOSIS — L600 Ingrowing nail: Secondary | ICD-10-CM | POA: Diagnosis not present

## 2018-07-03 DIAGNOSIS — L03031 Cellulitis of right toe: Secondary | ICD-10-CM | POA: Diagnosis not present

## 2018-07-03 DIAGNOSIS — E1140 Type 2 diabetes mellitus with diabetic neuropathy, unspecified: Secondary | ICD-10-CM | POA: Diagnosis not present

## 2018-07-03 DIAGNOSIS — E1151 Type 2 diabetes mellitus with diabetic peripheral angiopathy without gangrene: Secondary | ICD-10-CM | POA: Diagnosis not present

## 2018-08-15 ENCOUNTER — Ambulatory Visit (INDEPENDENT_AMBULATORY_CARE_PROVIDER_SITE_OTHER): Payer: Medicare Other | Admitting: Internal Medicine

## 2018-08-22 ENCOUNTER — Ambulatory Visit: Payer: Medicare Other | Admitting: General Surgery

## 2018-08-22 ENCOUNTER — Encounter: Payer: Self-pay | Admitting: General Surgery

## 2018-08-22 VITALS — BP 126/87 | HR 94 | Temp 97.3°F | Resp 20 | Wt 247.2 lb

## 2018-08-22 DIAGNOSIS — M6208 Separation of muscle (nontraumatic), other site: Secondary | ICD-10-CM | POA: Diagnosis not present

## 2018-08-22 DIAGNOSIS — E1140 Type 2 diabetes mellitus with diabetic neuropathy, unspecified: Secondary | ICD-10-CM | POA: Diagnosis not present

## 2018-08-22 DIAGNOSIS — E1151 Type 2 diabetes mellitus with diabetic peripheral angiopathy without gangrene: Secondary | ICD-10-CM | POA: Diagnosis not present

## 2018-08-22 DIAGNOSIS — K429 Umbilical hernia without obstruction or gangrene: Secondary | ICD-10-CM

## 2018-08-22 DIAGNOSIS — M79672 Pain in left foot: Secondary | ICD-10-CM | POA: Diagnosis not present

## 2018-08-22 DIAGNOSIS — L89893 Pressure ulcer of other site, stage 3: Secondary | ICD-10-CM | POA: Diagnosis not present

## 2018-08-22 DIAGNOSIS — M79675 Pain in left toe(s): Secondary | ICD-10-CM | POA: Diagnosis not present

## 2018-08-22 NOTE — Progress Notes (Signed)
[name] History and Physical  Reason for Referral: Hernia ?  Referring Physician:  Dr. Farrell [name] is a 58 y.o. male.  HPI:  Mike Floyd is a 58 yo with multiple medical issues including DM, HTN, CKD, H/o TIA, history of MI and dilated cardiomyopathy, CHF, prior AICD that has been removed who has been having some upper abdominal pain at times, and was seen by his primary care physician and referred for hernia.  He says the pain is ache in nature and he has tenderness in the area when he presses on it. He reports no nausea/vomiting or obstructive type symptoms.  He reports that he has had a laparoscopic gallbladder surgery in the past and an open appendectomy. He says that his bulge has been getting larger in the last few years, and that he was told he had a hernia.   He walks with a cane, and says that he follows with Cardiology annually. He was last seen by Dr. Lovena Le in 2018.    Past Medical History:  Diagnosis Date  . AICD (automatic cardioverter/defibrillator) present 11/2007   a. 11/2007 SJM Current VR - single lead ICD  . Anxiety   . Chest pain    a. 10/2007 Cath:  normal Cors.  . CKD (chronic kidney disease), stage II   . DDD (degenerative disc disease), lumbar   . Diabetes mellitus DX: 2010  . Erosive esophagitis    a. per EGD (08/2011), Dr. Laural Golden - Erosive reflux esophagitis improved but not completely healed since previous EGD 3 years ago. Bx showing  ulcerated gatroesophageal junction mucosa. negative for H. pylori  . GERD (gastroesophageal reflux disease)   . Gout   . Hearing deficit    a. wear bilateral hearing aides  . History of hiatal hernia   . Hypertension   . Myocardial infarction ([place]) 2011  . Nonischemic dilated cardiomyopathy ([place])    a. H/O EF as low as 35-40% by LV gram 10/2007;  b. Echo 02/2011 EF 50-55%, inf HK, Gr 1 DD   . Renal insufficiency   . Sleep apnea    pt doesnt use, states"I cant afford one". PCP aware  . Stroke  ([place])    mini-stroke in 2014  . TIA (transient ischemic attack)    July, 2013  . Tobacco abuse, in remission 06/27/2009   Discontinued in 2009    . Wears dentures    top plate  . WPW (Wolff-Parkinson-White syndrome)    a. s/p RFCA @ [facility]    Past Surgical History:  Procedure Laterality Date  . APPENDECTOMY    . BACK SURGERY     1995  . CARDIAC CATHETERIZATION     2009  . CARDIAC DEFIBRILLATOR PLACEMENT    . CHOLECYSTECTOMY    . CHOLECYSTECTOMY, LAPAROSCOPIC     11/2007  . COLONOSCOPY W/ POLYPECTOMY  2009  . ELBOW SURGERY Left 06/2010  . ESOPHAGEAL DILATION N/A 11/08/2014   Procedure: ESOPHAGEAL DILATION;  Surgeon: Rogene Houston, MD;  Location: [facility];  Service: Endoscopy;  Laterality: N/A;  #56,   . ESOPHAGOGASTRODUODENOSCOPY  03/31/2012   also 08/2011; [name]  . ESOPHAGOGASTRODUODENOSCOPY (EGD) WITH PROPOFOL N/A 11/08/2014   Procedure: ESOPHAGOGASTRODUODENOSCOPY (EGD) WITH PROPOFOL;  Surgeon: Rogene Houston, MD;  Location: [facility];  Service: Endoscopy;  Laterality: N/A;  Hiatus is 39 , GE Junction is 37  . ICD LEAD REMOVAL N/A 10/07/2016   Procedure: ICD LEAD REMOVAL ;  Surgeon:  Evans Lance, MD;  Location: [place];  Service: Cardiovascular;  Laterality: N/A;  . MULTIPLE TOOTH EXTRACTIONS    . [place]; performed at [place] N/A 10/07/2016   Procedure: TRANSESOPHAGEAL ECHOCARDIOGRAM (TEE);  Surgeon: Evans Lance, MD;  Location: [facility] OR;  Service: Cardiovascular;  Laterality: N/A;    Family History  Problem Relation Age of Onset  . Heart attack Mother 65  . Hypertension Mother   . Diabetes Mother   . Kidney disease Mother   . Breast cancer Mother   . Heart attack Father 62  . Hypertension Father   . Diabetes Father   . Stroke Brother   . Heart attack Brother 17  . Stroke Maternal Grandmother 80  . Diabetes Brother   . Hypertension Brother     Social History   Tobacco Use  . Smoking status: Former  Smoker    Packs/day: 1.50    Years: 35.00    Pack years: 52.50    Types: Cigarettes    Start date: 06/30/1971    Last attempt to quit: 07/30/2010    Years since quitting: 8.0  . Smokeless tobacco: Former Systems developer    Types: [place] date: 03/26/1994  . Tobacco comment: currently uses a vape 11-06-2014.  Substance Use Topics  . Alcohol use: No    Alcohol/week: 1.0 standard drinks    Types: 1 Cans of beer per week    Comment: used "years ago"  . Drug use: No    Medications: I have reviewed the patient's current medications. ? Took Plavix in the past. Reports he still takes it. Dr. Gerarda Fraction note has it on his list also.  Allergies as of 08/22/2018   No Known Allergies     Medication List       Accurate as of August 22, 2018 11:59 AM. Always use your most recent med list.        acarbose 50 MG tablet Commonly known as:  PRECOSE Take 50 mg 3 (three) times daily with meals by mouth.   amitriptyline 50 MG tablet Commonly known as:  ELAVIL Take 50 mg by mouth at bedtime.   aspirin 81 MG tablet Take 81 mg by mouth daily as needed for pain.   atorvastatin 20 MG tablet Commonly known as:  LIPITOR Take 20 mg by mouth daily.   B-D ULTRAFINE III SHORT PEN 31G X 8 MM Misc Generic drug:  Insulin Pen Needle USE WITH TRESBIA.   CALCIUM PO Take 1 tablet by mouth daily.   carvedilol 12.5 MG tablet Commonly known as:  COREG Take 1 tablet (12.5 mg total) by mouth 2 (two) times daily.   carvedilol 12.5 MG tablet Commonly known as:  COREG Take 1 tablet (12.5 mg total) by mouth 2 (two) times daily.   FOLTANX 3-35-2 MG Tabs Take 1 tablet by mouth 2 (two) times daily.   furosemide 20 MG tablet Commonly known as:  LASIX TAKE (1) TABLET BY MOUTH ONCE DAILY.   gabapentin 300 MG capsule Commonly known as:  NEURONTIN Take 300 mg by mouth 3 (three) times daily.   insulin degludec 100 UNIT/ML Sopn FlexTouch Pen Commonly known as:  TRESIBA FLEXTOUCH Inject 0.3 mLs (30 Units total)  into the skin at bedtime.   lisinopril 5 MG tablet Commonly known as:  PRINIVIL,ZESTRIL TAKE ONE TABLET BY MOUTH ONCE DAILY.   LORazepam 1 MG tablet Commonly known as:  ATIVAN Take  1 mg by mouth 3 (three) times daily.   metoCLOPramide 10 MG tablet Commonly known as:  REGLAN TAKE 1 TABLET BY MOUTH 30 MINUTES BEFORE BREAKFAST AND EVENING MEAL DAILY.   nitroGLYCERIN 0.4 MG SL tablet Commonly known as:  NITROSTAT Place 1 tablet (0.4 mg total) under the tongue every 5 (five) minutes as needed for chest pain.   omeprazole 40 MG capsule Commonly known as:  PRILOSEC TAKE (1) CAPSULE BY MOUTH ONCE DAILY.   ONETOUCH VERIO test strip Generic drug:  glucose blood TESTING 4 TIMES DAILY.   oxycodone 30 MG immediate release tablet Commonly known as:  ROXICODONE Take 1-2 tablets by mouth 4 (four) times daily as needed for pain.   sucralfate 1 g tablet Commonly known as:  CARAFATE TAKE 1 TABLET BY MOUTH 30 MINTUES BEFORE MEALS AND AT BEDTIME.   tamsulosin 0.4 MG Caps capsule Commonly known as:  FLOMAX Take 0.4 mg daily by mouth.   Vitamin D3 125 MCG (5000 UT) Caps Take 1 capsule (5,000 Units total) by mouth daily.        ROS:  A comprehensive review of systems was negative except for: Gastrointestinal: positive for abdominal pain Musculoskeletal: positive for back pain and stiff joints Neurological: positive for numbness in feet  Blood pressure 126/87, pulse 94, temperature (!) 97.3 F (36.3 C), temperature source Temporal, resp. rate 20, weight 247 lb 3.2 oz (112.1 kg). Physical Exam Vitals signs reviewed.  Constitutional:      Appearance: Normal appearance.  HENT:     Head: Normocephalic.     Nose: Nose normal.     Mouth/Throat:     Mouth: Mucous membranes are moist.  Eyes:     Extraocular Movements: Extraocular movements intact.     Pupils: Pupils are equal, round, and reactive to light.  Neck:     Musculoskeletal: Normal range of motion.  Cardiovascular:     Rate  and Rhythm: Normal rate.  Abdominal:     General: There is no distension.     Palpations: Abdomen is soft.     Tenderness: There is abdominal tenderness. There is no guarding or rebound.     Hernia: A hernia is present. Hernia is present in the umbilical area.     Comments: Epigastric region above umbilical hernia with diastasis of the abdominal muscles, gap is about 5cm; diastasis with some tenderness, umbilical hernia fascial defect 1cm, reducible, minimally tender  Musculoskeletal:        General: No swelling.  Skin:    General: Skin is warm and dry.  Neurological:     General: No focal deficit present.     Mental Status: He is alert and oriented to person, place, and time.  Psychiatric:        Mood and Affect: Mood normal.        Behavior: Behavior normal.        Thought Content: Thought content normal.        Judgment: Judgment normal.     Results: Reviewed previous CT a/p 2017- diastasis present at that time in the epigastric area, recti muscles separated by about 4cm, umbilical hernia with fat   Assessment & Plan:  Mike Floyd is a 58 y.o. male with an umbilical hernia that is small in size about 1 cm and also diastasis recti above this area. The area of tenderness he complains about is the diastasis on palpation.  His umbilical hernia is small and reducible. The CT from 2017 confirms these findings  and likely his diastasis is worsen given the 5cm gap on exam compared to the 4cm gap on CT in 2017.   We have discussed the pathophysiology of diastasis and that this is a thinning/ separation of the muscles but not a hernia and does not get repaired. We discussed that some exercises may help the separation and that tenderness/ back problems are common with diastasis.  We discussed the separate umbilical hernia and that this can be repaired with mesh if it is causing him problems. We discussed reasons to go to the ED due to risk of strangulation/ incarceration of the umbilical hernia.  We discussed the need to see cardiology and get a risk stratification prior to any surgery.   -At this time, the patient wants to wait on getting any umbilical hernia repair  -Information given on diastasis and exercises printed out for diastasis  -Follow up PRN  -Call if he decides to get his umbilical hernia repaired, will need referral to cardiology prior   Discussed the risk of umbilical hernia surgery with mesh including but not limited to pain, bleeding, infection, injury to bowel, recurrence risk, use of mesh pending him wanting a repair in the future.   All questions were answered to the satisfaction of the patient.  Virl Cagey 08/22/2018, 11:59 AM

## 2018-08-22 NOTE — Patient Instructions (Signed)
Diastasis Recti  Diastasis recti is when the muscles of the abdomen (rectus abdominis muscles) become thin and separate. The result is a wider space between the right and left abdomen (abdominal) muscles. This wider space between the muscles may cause a bulge in the middle of your abdomen. You may notice this bulge when you are straining or when you sit up from a lying down position. Diastasis recti can affect men and women. It is most common among pregnant women, infants, people who are obese, and people who have had abdominal surgery. Exercise or surgical treatment may help correct it. What are the causes? Common causes of this condition include:  Pregnancy. The growing uterus puts pressure on the abdominal muscles, which causes the muscles to separate.  Obesity. Excess fat puts pressure on abdominal muscles.  Weightlifting.  Some abdomen exercises.  Advanced age.  Genetics.  Prior abdominal surgery. What increases the risk? This condition is more likely to develop in:  Women.  Newborns, especially newborns who are born early (prematurely). What are the signs or symptoms? Common symptoms of this condition include:  A bulge in the middle of the abdomen. You will notice it most when you sit up or strain.  Pain in the low back, pelvis, or hips.  Constipation.  Inability to control when you urinate (urinary incontinence).  Bloating.  Poor posture. How is this diagnosed? This condition is diagnosed with a physical exam. Your health care provider will ask you to lie flat on your back and do a crunch or half sit-up. If you have diastasis recti, a vertical bulge will appear between your abdominal muscles in the center of your abdomen. Your health care provider will measure the gap between your muscles with one of the following:  A medical device used to measure the space between two objects (caliper).  A tape measure.  CT scan.  Ultrasound.  Finger spaces. Your health  care provider will measure the space using their fingers. How is this treated? If your muscle separation is not too large, you may not need treatment. However, if you are a woman who plans to become pregnant again, you should treat this condition before your next pregnancy. Treatment may include:  Physical therapy to strengthen and tighten your abdominal muscles.  Lifestyle changes such as weight loss and exercise.  Over-the-counter pain medicines as needed.  Surgery to correct the separation. Follow these instructions at home: Activity  Return to your normal activities as told by your health care provider. Ask your health care provider what activities are safe for you.  When lifting weights or doing exercises using your abdominal muscles or the muscles in the center of your body that give stability (core muscles), make sure you are doing your exercises and movements correctly. Proper form can help to prevent the condition from happening again. General instructions  If you are overweight, ask your health care provider for help with weight loss. Losing even a small amount of weight can help to improve your diastasis recti.  Take over-the-counter or prescription medicines only as told by your health care provider.  Do not strain. Straining can make the separation worse. Examples of straining include: ? Pushing hard to have a bowel movement, such as due to constipation. ? Lifting heavy objects, including children. ? Standing up and sitting down.  Take steps to prevent constipation: ? Drink enough fluid to keep your urine clear or pale yellow. ? Take over-the-counter or prescription medicines only as directed. ? Eat foods   that are high in fiber, such as fresh fruits and vegetables, whole grains, and beans. ? Limit foods that are high in fat and processed sugars, such as fried and sweet foods. Contact a health care provider if:  You notice a new bulge in your abdomen. Get help right  away if:  You experience severe discomfort in your abdomen.  You develop severe abdominal pain along with nausea, vomiting, or fever. Summary  Diastasis recti is when the abdomen (abdominal) muscles become thin and separate. Your abdomen will stick out because the space between your right and left abdomen muscles has widened.  The most common symptom is a bulge in your abdomen. You will notice it most when you sit up or are straining.  This condition is diagnosed during a physical exam.  If the abdomen separation is not too big, you may choose not to have treatment. Otherwise, you may need to undergo physical therapy or surgery. This information is not intended to replace advice given to you by your health care provider. Make sure you discuss any questions you have with your health care provider. Document Released: 09/20/2016 Document Revised: 09/20/2016 Document Reviewed: 09/20/2016 Elsevier Interactive Patient Education  2019 [employer]   Umbilical Hernia, Adult  A hernia is a bulge of tissue that pushes through an opening between muscles. An umbilical hernia happens in the abdomen, near the belly button (umbilicus). The hernia may contain tissues from the small intestine, large intestine, or fatty tissue covering the intestines (omentum). Umbilical hernias in adults tend to get worse over time, and they require surgical treatment. There are several types of umbilical hernias. You may have:  A hernia located just above or below the umbilicus (indirect hernia). This is the most common type of umbilical hernia in adults.  A hernia that forms through an opening formed by the umbilicus (direct hernia).  A hernia that comes and goes (reducible hernia). A reducible hernia may be visible only when you strain, lift something heavy, or cough. This type of hernia can be pushed back into the abdomen (reduced).  A hernia that traps abdominal tissue inside the hernia (incarcerated hernia).  This type of hernia cannot be reduced.  A hernia that cuts off blood flow to the tissues inside the hernia (strangulated hernia). The tissues can start to die if this happens. This type of hernia requires emergency treatment. What are the causes? An umbilical hernia happens when tissue inside the abdomen presses on a weak area of the abdominal muscles. What increases the risk? You may have a greater risk of this condition if you:  Are obese.  Have had several pregnancies.  Have a buildup of fluid inside your abdomen (ascites).  Have had surgery that weakens the abdominal muscles. What are the signs or symptoms? The main symptom of this condition is a painless bulge at or near the belly button. A reducible hernia may be visible only when you strain, lift something heavy, or cough. Other symptoms may include:  Dull pain.  A feeling of pressure. Symptoms of a strangulated hernia may include:  Pain that gets increasingly worse.  Nausea and vomiting.  Pain when pressing on the hernia.  Skin over the hernia becoming red or purple.  Constipation.  Blood in the stool. How is this diagnosed? This condition may be diagnosed based on:  A physical exam. You may be asked to cough or strain while standing. These actions increase the pressure inside your abdomen and force the hernia through the  opening in your muscles. Your health care provider may try to reduce the hernia by pressing on it.  Your symptoms and medical history. How is this treated? Surgery is the only treatment for an umbilical hernia. Surgery for a strangulated hernia is done as soon as possible. If you have a small hernia that is not incarcerated, you may need to lose weight before having surgery. Follow these instructions at home:  Lose weight, if told by your health care provider.  Do not try to push the hernia back in.  Watch your hernia for any changes in color or size. Tell your health care provider if any  changes occur.  You may need to avoid activities that increase pressure on your hernia.  Do not lift anything that is heavier than 10 lb (4.5 kg) until your health care provider says that this is safe.  Take over-the-counter and prescription medicines only as told by your health care provider.  Keep all follow-up visits as told by your health care provider. This is important. Contact a health care provider if:  Your hernia gets larger.  Your hernia becomes painful. Get help right away if:  You develop sudden, severe pain near the area of your hernia.  You have pain as well as nausea or vomiting.  You have pain and the skin over your hernia changes color.  You develop a fever. This information is not intended to replace advice given to you by your health care provider. Make sure you discuss any questions you have with your health care provider. Document Released: 12/26/2015 Document Revised: 09/07/2017 Document Reviewed: 01/24/2017 Elsevier Interactive Patient Education  2019 [employer], Adult  Open hernia repair is a surgical procedure to fix a hernia. A hernia occurs when an internal organ or tissue pushes out through a weak spot in the abdominal wall muscles. Hernias commonly occur in the groin and around the navel. Most hernias tend to get worse over time. Often, surgery is done to prevent the hernia from becoming bigger, uncomfortable, or an emergency. Emergency surgery may be needed if abdominal contents get stuck in the opening (incarcerated hernia) or the blood supply gets cut off (strangulated hernia). In an open repair, an incision is made in the abdomen to perform the surgery. Tell a health care provider about:  Any allergies you have.  All medicines you are taking, including vitamins, herbs, eye drops, creams, and over-the-counter medicines.  Any problems you or family members have had with anesthetic medicines.  Any blood or bone disorders  you have.  Any surgeries you have had.  Any medical conditions you have, including any recent cold or flu symptoms.  Whether you are pregnant or may be pregnant. What are the risks? Generally, this is a safe procedure. However, problems may occur, including:  Long-lasting (chronic) pain.  Bleeding.  Infection.  Damage to the testicle. This can cause shrinking or swelling.  Damage to the bladder, blood vessels, intestine, or nerves near the hernia.  Trouble passing urine.  Allergic reactions to medicines.  Return of the hernia. Medicines  Ask your health care provider about: ? Changing or stopping your regular medicines. This is especially important if you are taking diabetes medicines or blood thinners. ? Taking medicines such as aspirin and ibuprofen. These medicines can thin your blood. Do not take these medicines before your procedure if your health care provider instructs you not to.  You may be given antibiotic medicine to help prevent infection.  General instructions  You may have blood tests or imaging studies.  Ask your health care provider how your surgical site will be marked or identified.  If you smoke, do not smoke for at least 2 weeks before your procedure or for as long as told by your health care provider.  Let your health care provider know if you develop a cold or any infection before your surgery.  Plan to have someone take you home from the hospital or clinic.  If you will be going home right after the procedure, plan to have someone with you for 24 hours. What happens during the procedure?  To reduce your risk of infection: ? Your health care team will wash or sanitize their hands. ? Your skin will be washed with soap. ? Hair may be removed from the surgical area.  An IV tube will be inserted into one of your veins.  You will be given one or more of the following: ? A medicine to help you relax (sedative). ? A medicine to numb the area  (local anesthetic). ? A medicine to make you fall asleep (general anesthetic).  Your surgeon will make an incision over the hernia.  The tissues of the hernia will be moved back into place.  The edges of the hernia may be stitched together.  The opening in the abdominal muscles will be closed with stitches (sutures). Or, your surgeon will place a mesh patch made of manmade (synthetic) material over the opening.  The incision will be closed.  A bandage (dressing) may be placed over the incision. The procedure may vary among health care providers and hospitals. What happens after the procedure?  Your blood pressure, heart rate, breathing rate, and blood oxygen level will be monitored until the medicines you were given have worn off.  You may be given medicine for pain.  Do not drive for 24 hours if you received a sedative. This information is not intended to replace advice given to you by your health care provider. Make sure you discuss any questions you have with your health care provider. Document Released: 01/19/2001 Document Revised: 02/13/2016 Document Reviewed: 01/07/2016 Elsevier Interactive Patient Education  2019 [employer].

## 2018-08-24 ENCOUNTER — Other Ambulatory Visit (INDEPENDENT_AMBULATORY_CARE_PROVIDER_SITE_OTHER): Payer: Self-pay | Admitting: Internal Medicine

## 2018-08-24 ENCOUNTER — Other Ambulatory Visit: Payer: Self-pay | Admitting: Internal Medicine

## 2018-08-25 ENCOUNTER — Other Ambulatory Visit (INDEPENDENT_AMBULATORY_CARE_PROVIDER_SITE_OTHER): Payer: Self-pay | Admitting: Internal Medicine

## 2018-08-29 DIAGNOSIS — L600 Ingrowing nail: Secondary | ICD-10-CM | POA: Diagnosis not present

## 2018-08-29 DIAGNOSIS — L03032 Cellulitis of left toe: Secondary | ICD-10-CM | POA: Diagnosis not present

## 2018-08-29 DIAGNOSIS — E1140 Type 2 diabetes mellitus with diabetic neuropathy, unspecified: Secondary | ICD-10-CM | POA: Diagnosis not present

## 2018-08-29 DIAGNOSIS — E1151 Type 2 diabetes mellitus with diabetic peripheral angiopathy without gangrene: Secondary | ICD-10-CM | POA: Diagnosis not present

## 2018-08-29 DIAGNOSIS — L03031 Cellulitis of right toe: Secondary | ICD-10-CM | POA: Diagnosis not present

## 2018-09-06 ENCOUNTER — Other Ambulatory Visit: Payer: Self-pay | Admitting: Internal Medicine

## 2018-09-06 NOTE — Telephone Encounter (Signed)
This is a [facility] [place] pt.  °

## 2018-09-07 ENCOUNTER — Ambulatory Visit: Payer: Medicare Other | Admitting: General Surgery

## 2018-09-12 DIAGNOSIS — L03031 Cellulitis of right toe: Secondary | ICD-10-CM | POA: Diagnosis not present

## 2018-09-12 DIAGNOSIS — E1151 Type 2 diabetes mellitus with diabetic peripheral angiopathy without gangrene: Secondary | ICD-10-CM | POA: Diagnosis not present

## 2018-09-12 DIAGNOSIS — E1140 Type 2 diabetes mellitus with diabetic neuropathy, unspecified: Secondary | ICD-10-CM | POA: Diagnosis not present

## 2018-09-12 DIAGNOSIS — L600 Ingrowing nail: Secondary | ICD-10-CM | POA: Diagnosis not present

## 2018-09-12 DIAGNOSIS — L03032 Cellulitis of left toe: Secondary | ICD-10-CM | POA: Diagnosis not present

## 2018-09-25 ENCOUNTER — Other Ambulatory Visit (INDEPENDENT_AMBULATORY_CARE_PROVIDER_SITE_OTHER): Payer: Self-pay | Admitting: Internal Medicine

## 2018-10-06 ENCOUNTER — Other Ambulatory Visit: Payer: Self-pay | Admitting: Internal Medicine

## 2018-10-11 DIAGNOSIS — I10 Essential (primary) hypertension: Secondary | ICD-10-CM | POA: Diagnosis not present

## 2018-10-11 DIAGNOSIS — E1165 Type 2 diabetes mellitus with hyperglycemia: Secondary | ICD-10-CM | POA: Diagnosis not present

## 2018-10-11 DIAGNOSIS — Z1389 Encounter for screening for other disorder: Secondary | ICD-10-CM | POA: Diagnosis not present

## 2018-10-11 DIAGNOSIS — E1140 Type 2 diabetes mellitus with diabetic neuropathy, unspecified: Secondary | ICD-10-CM | POA: Diagnosis not present

## 2018-10-20 ENCOUNTER — Other Ambulatory Visit: Payer: Self-pay | Admitting: Cardiovascular Disease

## 2018-10-30 ENCOUNTER — Other Ambulatory Visit: Payer: Self-pay | Admitting: Internal Medicine

## 2018-11-08 ENCOUNTER — Other Ambulatory Visit: Payer: Self-pay | Admitting: "Endocrinology

## 2018-11-15 DIAGNOSIS — G894 Chronic pain syndrome: Secondary | ICD-10-CM | POA: Diagnosis not present

## 2018-11-15 DIAGNOSIS — I10 Essential (primary) hypertension: Secondary | ICD-10-CM | POA: Diagnosis not present

## 2018-11-15 DIAGNOSIS — E1140 Type 2 diabetes mellitus with diabetic neuropathy, unspecified: Secondary | ICD-10-CM | POA: Diagnosis not present

## 2018-11-20 ENCOUNTER — Other Ambulatory Visit: Payer: Self-pay | Admitting: Cardiovascular Disease

## 2018-11-21 DIAGNOSIS — L600 Ingrowing nail: Secondary | ICD-10-CM | POA: Diagnosis not present

## 2018-11-21 DIAGNOSIS — L03032 Cellulitis of left toe: Secondary | ICD-10-CM | POA: Diagnosis not present

## 2018-11-21 DIAGNOSIS — E1140 Type 2 diabetes mellitus with diabetic neuropathy, unspecified: Secondary | ICD-10-CM | POA: Diagnosis not present

## 2018-11-21 DIAGNOSIS — L03031 Cellulitis of right toe: Secondary | ICD-10-CM | POA: Diagnosis not present

## 2018-11-21 DIAGNOSIS — E1151 Type 2 diabetes mellitus with diabetic peripheral angiopathy without gangrene: Secondary | ICD-10-CM | POA: Diagnosis not present

## 2018-11-22 ENCOUNTER — Other Ambulatory Visit: Payer: Self-pay | Admitting: Internal Medicine

## 2018-12-05 DIAGNOSIS — I10 Essential (primary) hypertension: Secondary | ICD-10-CM | POA: Diagnosis not present

## 2018-12-05 DIAGNOSIS — G894 Chronic pain syndrome: Secondary | ICD-10-CM | POA: Diagnosis not present

## 2018-12-05 DIAGNOSIS — Z0001 Encounter for general adult medical examination with abnormal findings: Secondary | ICD-10-CM | POA: Diagnosis not present

## 2018-12-05 DIAGNOSIS — M1991 Primary osteoarthritis, unspecified site: Secondary | ICD-10-CM | POA: Diagnosis not present

## 2018-12-05 DIAGNOSIS — Z1389 Encounter for screening for other disorder: Secondary | ICD-10-CM | POA: Diagnosis not present

## 2018-12-15 ENCOUNTER — Other Ambulatory Visit: Payer: Self-pay | Admitting: "Endocrinology

## 2018-12-17 DIAGNOSIS — Z1211 Encounter for screening for malignant neoplasm of colon: Secondary | ICD-10-CM | POA: Diagnosis not present

## 2018-12-19 DIAGNOSIS — E1140 Type 2 diabetes mellitus with diabetic neuropathy, unspecified: Secondary | ICD-10-CM | POA: Diagnosis not present

## 2018-12-19 DIAGNOSIS — E7849 Other hyperlipidemia: Secondary | ICD-10-CM | POA: Diagnosis not present

## 2018-12-19 DIAGNOSIS — Z79899 Other long term (current) drug therapy: Secondary | ICD-10-CM | POA: Diagnosis not present

## 2018-12-19 DIAGNOSIS — E119 Type 2 diabetes mellitus without complications: Secondary | ICD-10-CM | POA: Diagnosis not present

## 2018-12-19 DIAGNOSIS — E039 Hypothyroidism, unspecified: Secondary | ICD-10-CM | POA: Diagnosis not present

## 2018-12-20 ENCOUNTER — Other Ambulatory Visit: Payer: Self-pay

## 2018-12-20 ENCOUNTER — Other Ambulatory Visit (HOSPITAL_COMMUNITY): Payer: Self-pay | Admitting: Internal Medicine

## 2018-12-20 ENCOUNTER — Ambulatory Visit (HOSPITAL_COMMUNITY)
Admission: RE | Admit: 2018-12-20 | Discharge: 2018-12-20 | Disposition: A | Discharge status: Home / Self Care | Payer: Medicare Other | Source: Ambulatory Visit | Attending: Internal Medicine | Admitting: Internal Medicine

## 2018-12-20 DIAGNOSIS — S99922A Unspecified injury of left foot, initial encounter: Secondary | ICD-10-CM

## 2018-12-20 DIAGNOSIS — S99921A Unspecified injury of right foot, initial encounter: Secondary | ICD-10-CM | POA: Diagnosis not present

## 2018-12-22 ENCOUNTER — Other Ambulatory Visit: Payer: Self-pay | Admitting: "Endocrinology

## 2018-12-28 ENCOUNTER — Telehealth: Payer: Self-pay | Admitting: Cardiology

## 2018-12-28 NOTE — Telephone Encounter (Signed)

## 2018-12-29 ENCOUNTER — Telehealth (INDEPENDENT_AMBULATORY_CARE_PROVIDER_SITE_OTHER): Payer: Medicare Other | Admitting: Cardiology

## 2018-12-29 ENCOUNTER — Telehealth: Payer: Self-pay | Admitting: Cardiology

## 2018-12-29 ENCOUNTER — Encounter: Payer: Self-pay | Admitting: Cardiology

## 2018-12-29 VITALS — BP 113/61 | HR 112 | Ht 73.0 in | Wt 242.0 lb

## 2018-12-29 DIAGNOSIS — G4733 Obstructive sleep apnea (adult) (pediatric): Secondary | ICD-10-CM

## 2018-12-29 DIAGNOSIS — R0609 Other forms of dyspnea: Secondary | ICD-10-CM

## 2018-12-29 DIAGNOSIS — I428 Other cardiomyopathies: Secondary | ICD-10-CM

## 2018-12-29 DIAGNOSIS — G459 Transient cerebral ischemic attack, unspecified: Secondary | ICD-10-CM

## 2018-12-29 DIAGNOSIS — R0600 Dyspnea, unspecified: Secondary | ICD-10-CM

## 2018-12-29 NOTE — Patient Instructions (Addendum)
Your physician recommends that you schedule a follow-up appointment in: [place].   Your physician has recommended you make the following change in your medication:   TAKE LASIX 40 MG FOR 3 DAYS THEN RESUME LASIX 17 MG DAILY   Your physician has requested that you have an echocardiogram. Echocardiography is a painless test that uses sound waves to create images of your heart. It provides your doctor with information about the size and shape of your heart and how well your heart's chambers and valves are working. This procedure takes approximately one hour. There are no restrictions for this procedure.  Thank you for choosing [facility]!!

## 2018-12-29 NOTE — Telephone Encounter (Signed)
Virtual Visit Pre-Appointment Phone Call  "(Name), I am calling you today to discuss your upcoming appointment. We are currently trying to limit exposure to the virus that causes COVID-19 by seeing patients at home rather than in the office."  1. "What is the BEST phone number to call the day of the visit?" - include this in appointment notes  2. Do you have or have access to (through a family member/friend) a smartphone with video capability that we can use for your visit?" a. If yes - list this number in appt notes as cell (if different from BEST phone #) and list the appointment type as a VIDEO visit in appointment notes b. If no - list the appointment type as a PHONE visit in appointment notes  3. Confirm consent - "In the setting of the current Covid19 crisis, you are scheduled for a (phone or video) visit with your provider on (date) at (time).  Just as we do with many in-office visits, in order for you to participate in this visit, we must obtain consent.  If you'd like, I can send this to your mychart (if signed up) or email for you to review.  Otherwise, I can obtain your verbal consent now.  All virtual visits are billed to your insurance company just like a normal visit would be.  By agreeing to a virtual visit, we'd like you to understand that the technology does not allow for your provider to perform an examination, and thus may limit your provider's ability to fully assess your condition. If your provider identifies any concerns that need to be evaluated in person, we will make arrangements to do so.  Finally, though the technology is pretty good, we cannot assure that it will always work on either your or our end, and in the setting of a video visit, we may have to convert it to a phone-only visit.  In either situation, we cannot ensure that we have a secure connection.  Are you willing to proceed?" STAFF: Did the patient verbally acknowledge consent to telehealth visit? Document  YES/NO here: yes  4. Advise patient to be prepared - "Two hours prior to your appointment, go ahead and check your blood pressure, pulse, oxygen saturation, and your weight (if you have the equipment to check those) and write them all down. When your visit starts, your provider will ask you for this information. If you have an Apple Watch or Kardia device, please plan to have heart rate information ready on the day of your appointment. Please have a pen and paper handy nearby the day of the visit as well."  5. Give patient instructions for MyChart download to smartphone OR Doximity/[email or web address] as below if video visit (depending on what platform provider is using)  6. Inform patient they will receive a phone call 15 minutes prior to their appointment time (may be from unknown caller ID) so they should be prepared to answer    TELEPHONE CALL NOTE  Mike Floyd has been deemed a candidate for a follow-up tele-health visit to limit community exposure during the Covid-19 pandemic. I spoke with the patient via phone to ensure availability of phone/video source, confirm preferred email & phone number, and discuss instructions and expectations.  I reminded Mike Floyd to be prepared with any vital sign and/or heart rhythm information that could potentially be obtained via home monitoring, at the time of his visit. I reminded Mike Floyd to expect a phone call prior to  his visit.  Mike Floyd 12/29/2018 3:55 PM   INSTRUCTIONS FOR DOWNLOADING THE MYCHART APP TO SMARTPHONE  - The patient must first make sure to have activated MyChart and know their login information - If Apple, go to [employer] and type in MyChart in the search bar and download the app. If Android, ask patient to go to [employer] and type in [facility] in the search bar and download the app. The app is free but as with any other app downloads, their phone may require them to verify saved payment information or Apple/Android  password.  - The patient will need to then log into the app with their MyChart username and password, and select  as their healthcare provider to link the account. When it is time for your visit, go to the MyChart app, find appointments, and click Begin Video Visit. Be sure to Select Allow for your device to access the Microphone and Camera for your visit. You will then be connected, and your provider will be with you shortly.  **If they have any issues connecting, or need assistance please contact MyChart service desk [phone])**  **If using a computer, in order to ensure the best quality for their visit they will need to use either of the following Internet Browsers: [employer], or Google Chrome**  IF USING DOXIMITY or DOXY.ME - The patient will receive a link just prior to their visit by text.     FULL LENGTH CONSENT FOR TELE-HEALTH VISIT   I hereby voluntarily request, consent and authorize [facility] and its employed or contracted physicians, physician assistants, nurse practitioners or other licensed health care professionals (the Practitioner), to provide me with telemedicine health care services (the Services") as deemed necessary by the treating Practitioner. I acknowledge and consent to receive the Services by the Practitioner via telemedicine. I understand that the telemedicine visit will involve communicating with the Practitioner through live audiovisual communication technology and the disclosure of certain medical information by electronic transmission. I acknowledge that I have been given the opportunity to request an in-person assessment or other available alternative prior to the telemedicine visit and am voluntarily participating in the telemedicine visit.  I understand that I have the right to withhold or withdraw my consent to the use of telemedicine in the course of my care at any time, without affecting my right to future care or treatment,  and that the Practitioner or I may terminate the telemedicine visit at any time. I understand that I have the right to inspect all information obtained and/or recorded in the course of the telemedicine visit and may receive copies of available information for a reasonable fee.  I understand that some of the potential risks of receiving the Services via telemedicine include:   Delay or interruption in medical evaluation due to technological equipment failure or disruption;  Information transmitted may not be sufficient (e.g. poor resolution of images) to allow for appropriate medical decision making by the Practitioner; and/or   In rare instances, security protocols could fail, causing a breach of personal health information.  Furthermore, I acknowledge that it is my responsibility to provide information about my medical history, conditions and care that is complete and accurate to the best of my ability. I acknowledge that Practitioner's advice, recommendations, and/or decision may be based on factors not within their control, such as incomplete or inaccurate data provided by me or distortions of diagnostic images or specimens that may result from electronic transmissions. I  understand that the practice of medicine is not an exact science and that Practitioner makes no warranties or guarantees regarding treatment outcomes. I acknowledge that I will receive a copy of this consent concurrently upon execution via email to the email address I last provided but may also request a printed copy by calling the office of [facility].    I understand that my insurance will be billed for this visit.   I have read or had this consent read to me.  I understand the contents of this consent, which adequately explains the benefits and risks of the Services being provided via telemedicine.   I have been provided ample opportunity to ask questions regarding this consent and the Services and have had my questions  answered to my satisfaction.  I give my informed consent for the services to be provided through the use of telemedicine in my medical care  By participating in this telemedicine visit I agree to the above.

## 2018-12-29 NOTE — Telephone Encounter (Signed)
°  Precert needed for:  Echo  Location:  [facility]     Date:   January 08, 2019

## 2018-12-29 NOTE — Progress Notes (Signed)
Virtual Visit via Telephone Note   This visit type was conducted due to national recommendations for restrictions regarding the COVID-19 Pandemic (e.g. social distancing) in an effort to limit this patient's exposure and mitigate transmission in our community.  Due to his co-morbid illnesses, this patient is at least at moderate risk for complications without adequate follow up.  This format is felt to be most appropriate for this patient at this time.  The patient did not have access to video technology/had technical difficulties with video requiring transitioning to audio format only (telephone).  All issues noted in this document were discussed and addressed.  No physical exam could be performed with this format.  Please refer to the patient's chart for his  consent to telehealth for Mike Floyd.   Date:  12/29/2018   ID:  Mike Floyd, DOB 09/21/1960, MRN [id number]  Patient Location: Home Provider Location: Home  PCP:  Redmond School, MD  Cardiologist:  Dr Bronson Ing Electrophysiologist:  Dr Lovena Le  Evaluation Performed:  Follow-Up Visit  Chief Complaint:  DOE  History of Present Illness:    Mike Floyd is a 58 y.o. male with a history of nonischemic cardiomyopathy.  He had an ICD implanted in 2009, this system was removed in March 2018 as it was nonfunctioning and his ejection fraction had reportedly improved.  He also has a history of remote WPW status post ablation at [facility] in 1999.  Catheterization in 2009 showed normal coronary arteries.  He has had a prior TIA in 2013 and has diabetes which is been labile recently.  His last office visit with Mike Floyd was in March 2018.  At that time his medications were adjusted for hypotension.  The patient recently is complained of dyspnea on exertion.  This is been going on for several months.  His primary care provider suggested he contact Mike Floyd for further follow-up.  The patient denies orthopnea.  He says his medications have not  been adjusted recently.   The patient does not have symptoms concerning for COVID-19 infection (fever, chills, cough, or new shortness of breath).    Past Medical History:  Diagnosis Date  . AICD (automatic cardioverter/defibrillator) present 11/2007   a. 11/2007 SJM Current VR - single lead ICD  . Anxiety   . Chest pain    a. 10/2007 Cath:  normal Cors.  . CKD (chronic kidney disease), stage II   . DDD (degenerative disc disease), lumbar   . Diabetes mellitus DX: 2010  . Erosive esophagitis    a. per EGD (08/2011), Dr. Laural Golden - Erosive reflux esophagitis improved but not completely healed since previous EGD 3 years ago. Bx showing  ulcerated gatroesophageal junction mucosa. negative for H. pylori  . GERD (gastroesophageal reflux disease)   . Gout   . Hearing deficit    a. wear bilateral hearing aides  . History of hiatal hernia   . Hypertension   . Myocardial infarction ([place]) 2011  . Nonischemic dilated cardiomyopathy ([place])    a. H/O EF as low as 35-40% by LV gram 10/2007;  b. Echo 02/2011 EF 50-55%, inf HK, Gr 1 DD   . Renal insufficiency   . Sleep apnea    pt doesnt use, states"I cant afford one". PCP aware  . Stroke ([place])    mini-stroke in 2014  . TIA (transient ischemic attack)    July, 2013  . Tobacco abuse, in remission 06/27/2009   Discontinued in 2009    . Wears dentures  top plate  . WPW (Wolff-Parkinson-White syndrome)    a. s/p RFCA @ [place]   Past Surgical History:  Procedure Laterality Date  . APPENDECTOMY    . BACK SURGERY     1995  . CARDIAC CATHETERIZATION     2009  . CARDIAC DEFIBRILLATOR PLACEMENT    . CHOLECYSTECTOMY    . CHOLECYSTECTOMY, LAPAROSCOPIC     11/2007  . COLONOSCOPY W/ POLYPECTOMY  2009  . ELBOW SURGERY Left 06/2010  . ESOPHAGEAL DILATION N/A 11/08/2014   Procedure: ESOPHAGEAL DILATION;  Surgeon: Rogene Houston, MD;  Location: [facility];  Service: Endoscopy;  Laterality: N/A;  #56,   . ESOPHAGOGASTRODUODENOSCOPY  03/31/2012    also 08/2011; [name]  . ESOPHAGOGASTRODUODENOSCOPY (EGD) WITH PROPOFOL N/A 11/08/2014   Procedure: ESOPHAGOGASTRODUODENOSCOPY (EGD) WITH PROPOFOL;  Surgeon: Rogene Houston, MD;  Location: [facility];  Service: Endoscopy;  Laterality: N/A;  Hiatus is 39 , GE Junction is 37  . ICD LEAD REMOVAL N/A 10/07/2016   Procedure: ICD LEAD REMOVAL ;  Surgeon: Evans Lance, MD;  Location: [place];  Service: Cardiovascular;  Laterality: N/A;  . MULTIPLE TOOTH EXTRACTIONS    . [place]; performed at [place] N/A 10/07/2016   Procedure: TRANSESOPHAGEAL ECHOCARDIOGRAM (TEE);  Surgeon: Evans Lance, MD;  Location: [facility] OR;  Service: Cardiovascular;  Laterality: N/A;     Current Meds  Medication Sig  . acarbose (PRECOSE) 50 MG tablet Take 100 mg by mouth 3 (three) times daily with meals.   Marland Kitchen amitriptyline (ELAVIL) 50 MG tablet Take 50 mg by mouth at bedtime.   Marland Kitchen aspirin 81 MG tablet Take 81 mg by mouth daily as needed for pain.  Marland Kitchen atorvastatin (LIPITOR) 20 MG tablet Take 20 mg by mouth daily.  . B-D ULTRAFINE III SHORT PEN 31G X 8 MM MISC USE WITH TRESBIA.  Marland Kitchen CALCIUM PO Take 1 tablet by mouth daily.  . carvedilol (COREG) 12.5 MG tablet Take 1 tablet (12.5 mg total) by mouth 2 (two) times daily.  . Cholecalciferol (VITAMIN D3) 5000 units CAPS Take 1 capsule (5,000 Units total) by mouth daily.  . furosemide (LASIX) 20 MG tablet TAKE (1) TABLET BY MOUTH ONCE DAILY.  Marland Kitchen gabapentin (NEURONTIN) 300 MG capsule Take 300 mg by mouth 3 (three) times daily.   Marland Kitchen glimepiride (AMARYL) 4 MG tablet Take 4 mg by mouth daily with breakfast.  . insulin degludec (TRESIBA FLEXTOUCH) 100 UNIT/ML SOPN FlexTouch Pen Inject 0.3 mLs (30 Units total) into the skin at bedtime.  Marland Kitchen L-Methylfolate-B6-B12 (FOLTANX) 3-35-2 MG TABS Take 1 tablet by mouth 2 (two) times daily.  Marland Kitchen LORazepam (ATIVAN) 1 MG tablet Take 1 mg by mouth 3 (three) times daily.   . metoCLOPramide (REGLAN) 10 MG tablet  TAKE 1 TABLET BY MOUTH 30 MINUTES BEFORE BREAKFAST AND EVENING MEAL DAILY.  . nitroGLYCERIN (NITROSTAT) 0.4 MG SL tablet Place 1 tablet (0.4 mg total) under the tongue every 5 (five) minutes as needed for chest pain.  Marland Kitchen omeprazole (PRILOSEC) 40 MG capsule TAKE (1) CAPSULE BY MOUTH ONCE DAILY.  Marland Kitchen ONETOUCH VERIO test strip TESTING 4 TIMES DAILY.  Marland Kitchen oxycodone (ROXICODONE) 30 MG immediate release tablet Take 1-2 tablets by mouth 4 (four) times daily as needed for pain.   Marland Kitchen sucralfate (CARAFATE) 1 g tablet TAKE 1 TABLET BY MOUTH 30 MINTUES BEFORE MEALS AND AT BEDTIME.  . tamsulosin (FLOMAX) 0.4 MG CAPS capsule Take 0.4  mg daily by mouth.  . [DISCONTINUED] lisinopril (PRINIVIL,ZESTRIL) 5 MG tablet TAKE ONE TABLET BY MOUTH ONCE DAILY.     Allergies:   Patient has no known allergies.   Social History   Tobacco Use  . Smoking status: Former Smoker    Packs/day: 1.50    Years: 35.00    Pack years: 52.50    Types: Cigarettes    Start date: 06/30/1971    Last attempt to quit: 07/30/2010    Years since quitting: 8.4  . Smokeless tobacco: Former Systems developer    Types: [place] date: 03/26/1994  . Tobacco comment: currently uses a vape 11-06-2014.  Substance Use Topics  . Alcohol use: No    Alcohol/week: 1.0 standard drinks    Types: 1 Cans of beer per week    Comment: used "years ago"  . Drug use: No     Family Hx: The patient's family history includes Breast cancer in his mother; Diabetes in his brother, father, and mother; Heart attack (age of onset: 58) in his brother and father; Heart attack (age of onset: 56) in his mother; Hypertension in his brother, father, and mother; Kidney disease in his mother; Stroke in his brother; Stroke (age of onset: 5) in his maternal grandmother.  ROS:   Please see the history of present illness.    All other systems reviewed and are negative.   Prior CV studies:   The following studies were reviewed today:   Labs/Other Tests and Data Reviewed:     EKG:  No ECG reviewed.  Recent Labs: No results found for requested labs within last 8760 hours.   Recent Lipid Panel Lab Results  Component Value Date/Time   CHOL 151 03/15/2013 09:02 AM   TRIG 446 (H) 03/15/2013 09:02 AM   HDL 27 (L) 03/15/2013 09:02 AM   CHOLHDL 5.6 03/15/2013 09:02 AM   LDLCALC UNABLE TO CALCULATE IF TRIGLYCERIDE OVER 400 mg/dL 03/15/2013 09:02 AM    Wt Readings from Last 3 Encounters:  12/29/18 242 lb (109.8 kg)  08/22/18 247 lb 3.2 oz (112.1 kg)  08/15/17 247 lb 3.2 oz (112.1 kg)     Objective:    Vital Signs:  BP 113/61   Pulse (!) 112   Ht 6\' 1"  (1.854 m)   Wt 242 lb (109.8 kg)   BMI 31.93 kg/m    VITAL SIGNS:  reviewed Pt was in no acute distress on the phone.  ASSESSMENT & PLAN:    DOE- Unclear etiology but likely cause may be systolic CHF.   NICM- The last EF I found in Epic was from a TEE that showed his EF to be 30-35%  Normal cors- 2009  IDDM- Recently he has had trouble with control and his medications have been adjusted by his PCP.   S/P ICD- Implanted in 2009- removed March 208    COVID-19 Education: The signs and symptoms of COVID-19 were discussed with the patient and how to seek care for testing (follow up with PCP or arrange E-visit).  The importance of social distancing was discussed today.  Time:   Today, I have spent 15 minutes with the patient with telehealth technology discussing the above problems.     Medication Adjustments/Labs and Tests Ordered: Current medicines are reviewed at length with the patient today.  Concerns regarding medicines are outlined above.   Tests Ordered: Orders Placed This Encounter  Procedures  . ECHOCARDIOGRAM COMPLETE    Medication Changes: No orders of the defined  types were placed in this encounter.   Disposition:  Follow up I suggested he try increasing his Lasix to 40 mg daily x 3 days, then go back to 20 mg daily.  He needs an echo and an office visit which will be  arranged.   Signed, Kerin Ransom, PA-C  12/29/2018 3:47 PM    [facility]

## 2019-01-08 ENCOUNTER — Ambulatory Visit (HOSPITAL_COMMUNITY)
Admission: RE | Admit: 2019-01-08 | Discharge: 2019-01-08 | Disposition: A | Discharge status: Home / Self Care | Payer: Medicare Other | Source: Ambulatory Visit | Attending: Cardiology | Admitting: Cardiology

## 2019-01-08 ENCOUNTER — Other Ambulatory Visit: Payer: Self-pay

## 2019-01-08 DIAGNOSIS — R0609 Other forms of dyspnea: Secondary | ICD-10-CM | POA: Insufficient documentation

## 2019-01-08 NOTE — Progress Notes (Signed)
  Echocardiogram 2D Echocardiogram has been performed.  Mike Floyd 01/08/2019, 9:08 AM

## 2019-01-18 ENCOUNTER — Encounter: Payer: Self-pay | Admitting: *Deleted

## 2019-01-18 ENCOUNTER — Ambulatory Visit (INDEPENDENT_AMBULATORY_CARE_PROVIDER_SITE_OTHER): Payer: Medicare Other | Admitting: Cardiovascular Disease

## 2019-01-18 ENCOUNTER — Encounter: Payer: Self-pay | Admitting: Cardiovascular Disease

## 2019-01-18 VITALS — BP 102/64 | HR 89 | Ht 73.0 in | Wt 249.0 lb

## 2019-01-18 DIAGNOSIS — I428 Other cardiomyopathies: Secondary | ICD-10-CM | POA: Diagnosis not present

## 2019-01-18 DIAGNOSIS — R0789 Other chest pain: Secondary | ICD-10-CM

## 2019-01-18 DIAGNOSIS — E1159 Type 2 diabetes mellitus with other circulatory complications: Secondary | ICD-10-CM

## 2019-01-18 DIAGNOSIS — R079 Chest pain, unspecified: Secondary | ICD-10-CM | POA: Diagnosis not present

## 2019-01-18 DIAGNOSIS — IMO0001 Reserved for inherently not codable concepts without codable children: Secondary | ICD-10-CM

## 2019-01-18 DIAGNOSIS — I5023 Acute on chronic systolic (congestive) heart failure: Secondary | ICD-10-CM

## 2019-01-18 DIAGNOSIS — E119 Type 2 diabetes mellitus without complications: Secondary | ICD-10-CM

## 2019-01-18 DIAGNOSIS — Z794 Long term (current) use of insulin: Secondary | ICD-10-CM

## 2019-01-18 DIAGNOSIS — R0609 Other forms of dyspnea: Secondary | ICD-10-CM | POA: Diagnosis not present

## 2019-01-18 MED ORDER — FUROSEMIDE 40 MG PO TABS: 40.0000 mg | ORAL_TABLET | Freq: Every day | ORAL | 6 refills | Status: DC

## 2019-01-18 MED ORDER — METOPROLOL TARTRATE 100 MG PO TABS: 100.0000 mg | ORAL_TABLET | Freq: Once | ORAL | 0 refills | Status: DC

## 2019-01-18 MED ORDER — POTASSIUM CHLORIDE CRYS ER 20 MEQ PO TBCR: 20.0000 meq | EXTENDED_RELEASE_TABLET | Freq: Every day | ORAL | 6 refills | Status: DC

## 2019-01-18 NOTE — Patient Instructions (Addendum)
Medication Instructions:   Increase Lasix to 40mg  daily.   Begin Potassium 9meq daily.  Continue all other medications.    Labwork:  BMET - please do on Monday, 01/22/2019.    Office will contact with results via phone or letter.    BMET - will need to be done just prior to Coronary CT.  Testing/Procedures:  Coronary CT - done at [facility].  Office will contact with results via phone or letter.    Follow-Up: 2-3 months   Any Other Special Instructions Will Be Listed Below (If Applicable).  If you need a refill on your cardiac medications before your next appointment, please call your pharmacy.  ==================================================  Please arrive at the [facility] main entrance of [facility] at xx:xx AM (30-45 minutes prior to test start time)  [facility] [place], [place] [address] [phone]  Proceed to the [facility] Radiology Department (First Floor).  Please follow these instructions carefully (unless otherwise directed):  Hold all erectile dysfunction medications at least 48 hours prior to test.  On the Night Before the Test: . Be sure to Drink plenty of water. . Do not consume any caffeinated/decaffeinated beverages or chocolate 12 hours prior to your test. . Do not take any antihistamines 12 hours prior to your test. . If you take Metformin do not take 24 hours prior to test. . If the patient has contrast allergy: ? Patient will need a prescription for Prednisone and very clear instructions (as follows): 1. Prednisone 50 mg - take 13 hours prior to test 2. Take another Prednisone 50 mg 7 hours prior to test 3. Take another Prednisone 50 mg 1 hour prior to test 4. Take Benadryl 50 mg 1 hour prior to test . Patient must complete all four doses of above prophylactic medications. . Patient will need a ride after test due to Benadryl.  On the Day of the Test: . Drink plenty of water. Do not drink any  water within one hour of the test. . Do not eat any food 4 hours prior to the test. . You may take your regular medications prior to the test.  . Take metoprolol (Lopressor) two hours prior to test. . HOLD Furosemide/Hydrochlorothiazide morning of the test.       After the Test: . Drink plenty of water. . After receiving IV contrast, you may experience a mild flushed feeling. This is normal. . On occasion, you may experience a mild rash up to 24 hours after the test. This is not dangerous. If this occurs, you can take Benadryl 25 mg and increase your fluid intake. . If you experience trouble breathing, this can be serious. If it is severe call 911 IMMEDIATELY. If it is mild, please call our office. . If you take any of these medications: Glipizide/Metformin, Avandament, Glucavance, please do not take 48 hours after completing test.

## 2019-01-18 NOTE — Progress Notes (Signed)
SUBJECTIVE: The patient is a 58 year old male with a history of a nonischemic cardiomyopathy.  Cardiac catheterization in 2009 demonstrated normal coronary arteries.  He has a history of WPW ablation at [facility] in 1999. He had initial ICD implantation in 2009 which was removed in March 2018 due to system malfunction.  He has a history of atypical chest pain.  He saw L. Kilroy PA-C on 12/29/2018 and complained of some exertional dyspnea.  He underwent echocardiography on 01/08/2019 which demonstrated mild to moderately reduced left ventricular systolic function, LVEF 40 to 45%.  There was mild concentric LVH.  There was grade 1 diastolic dysfunction with elevated filling pressures.  The most recent echo prior to this was a TEE performed on 10/07/2016 which showed an EF of 30 to 35%.  A transthoracic echocardiogram on 08/05/2016 demonstrated normal left ventricular systolic function, LVEF 55 to 60%.  I do not have a copy of most recent labs.  He tells me that he has been short of breath for the past 4 to 5 months particular when walking across his yard.  He has occasional left-sided chest pains that radiate across the right side of his chest which occurred during bowel movements usually alleviated with aspirin.  He has 3 pillow orthopnea which has been ongoing for several years.  He denies paroxysmal nocturnal dyspnea.  He thinks his legs feel a little tight.  He said "I have bad diabetes ".   Review of Systems: As per "subjective", otherwise negative.  No Known Allergies  Current Outpatient Medications  Medication Sig Dispense Refill  . acarbose (PRECOSE) 50 MG tablet Take 100 mg by mouth 3 (three) times daily with meals.     Marland Kitchen amitriptyline (ELAVIL) 50 MG tablet Take 50 mg by mouth at bedtime.     Marland Kitchen aspirin 81 MG tablet Take 81 mg by mouth daily as needed for pain.    Marland Kitchen atorvastatin (LIPITOR) 20 MG tablet Take 20 mg by mouth daily.    . B-D ULTRAFINE III SHORT PEN 31G X 8 MM  MISC USE WITH TRESBIA. 100 each 0  . CALCIUM PO Take 1 tablet by mouth daily.    . Cholecalciferol (VITAMIN D3) 5000 units CAPS Take 1 capsule (5,000 Units total) by mouth daily. 90 capsule 0  . furosemide (LASIX) 20 MG tablet TAKE (1) TABLET BY MOUTH ONCE DAILY. 90 tablet 0  . gabapentin (NEURONTIN) 300 MG capsule Take 300 mg by mouth 3 (three) times daily.     Marland Kitchen glimepiride (AMARYL) 4 MG tablet Take 4 mg by mouth daily with breakfast.    . insulin degludec (TRESIBA FLEXTOUCH) 100 UNIT/ML SOPN FlexTouch Pen Inject 0.3 mLs (30 Units total) into the skin at bedtime. 15 mL 2  . L-Methylfolate-B6-B12 (FOLTANX) 3-35-2 MG TABS Take 1 tablet by mouth 2 (two) times daily.    Marland Kitchen LORazepam (ATIVAN) 1 MG tablet Take 1 mg by mouth 3 (three) times daily.     . metoCLOPramide (REGLAN) 10 MG tablet TAKE 1 TABLET BY MOUTH 30 MINUTES BEFORE BREAKFAST AND EVENING MEAL DAILY. 60 tablet 2  . omeprazole (PRILOSEC) 40 MG capsule TAKE (1) CAPSULE BY MOUTH ONCE DAILY. 30 capsule 6  . ONETOUCH VERIO test strip TESTING 4 TIMES DAILY. 150 each 5  . oxycodone (ROXICODONE) 30 MG immediate release tablet Take 1-2 tablets by mouth 4 (four) times daily as needed for pain.     Marland Kitchen sucralfate (CARAFATE) 1 g tablet TAKE 1 TABLET BY  MOUTH 30 MINTUES BEFORE MEALS AND AT BEDTIME. 120 tablet 3  . tamsulosin (FLOMAX) 0.4 MG CAPS capsule Take 0.4 mg daily by mouth.    . carvedilol (COREG) 12.5 MG tablet Take 1 tablet (12.5 mg total) by mouth 2 (two) times daily. 180 tablet 3  . nitroGLYCERIN (NITROSTAT) 0.4 MG SL tablet Place 1 tablet (0.4 mg total) under the tongue every 5 (five) minutes as needed for chest pain. 25 tablet 3   No current facility-administered medications for this visit.     Past Medical History:  Diagnosis Date  . AICD (automatic cardioverter/defibrillator) present 11/2007   a. 11/2007 SJM Current VR - single lead ICD  . Anxiety   . Chest pain    a. 10/2007 Cath:  normal Cors.  . CKD (chronic kidney disease), stage  II   . DDD (degenerative disc disease), lumbar   . Diabetes mellitus DX: 2010  . Erosive esophagitis    a. per EGD (08/2011), Dr. Laural Golden - Erosive reflux esophagitis improved but not completely healed since previous EGD 3 years ago. Bx showing  ulcerated gatroesophageal junction mucosa. negative for H. pylori  . GERD (gastroesophageal reflux disease)   . Gout   . Hearing deficit    a. wear bilateral hearing aides  . History of hiatal hernia   . Hypertension   . Myocardial infarction ([place]) 2011  . Nonischemic dilated cardiomyopathy ([place])    a. H/O EF as low as 35-40% by LV gram 10/2007;  b. Echo 02/2011 EF 50-55%, inf HK, Gr 1 DD   . Renal insufficiency   . Sleep apnea    pt doesnt use, states"I cant afford one". PCP aware  . Stroke ([place])    mini-stroke in 2014  . TIA (transient ischemic attack)    July, 2013  . Tobacco abuse, in remission 06/27/2009   Discontinued in 2009    . Wears dentures    top plate  . WPW (Wolff-Parkinson-White syndrome)    a. s/p RFCA @ [place]    Past Surgical History:  Procedure Laterality Date  . APPENDECTOMY    . BACK SURGERY     1995  . CARDIAC CATHETERIZATION     2009  . CARDIAC DEFIBRILLATOR PLACEMENT    . CHOLECYSTECTOMY    . CHOLECYSTECTOMY, LAPAROSCOPIC     11/2007  . COLONOSCOPY W/ POLYPECTOMY  2009  . ELBOW SURGERY Left 06/2010  . ESOPHAGEAL DILATION N/A 11/08/2014   Procedure: ESOPHAGEAL DILATION;  Surgeon: Rogene Houston, MD;  Location: [facility];  Service: Endoscopy;  Laterality: N/A;  #56,   . ESOPHAGOGASTRODUODENOSCOPY  03/31/2012   also 08/2011; [name]  . ESOPHAGOGASTRODUODENOSCOPY (EGD) WITH PROPOFOL N/A 11/08/2014   Procedure: ESOPHAGOGASTRODUODENOSCOPY (EGD) WITH PROPOFOL;  Surgeon: Rogene Houston, MD;  Location: [facility];  Service: Endoscopy;  Laterality: N/A;  Hiatus is 39 , GE Junction is 37  . ICD LEAD REMOVAL N/A 10/07/2016   Procedure: ICD LEAD REMOVAL ;  Surgeon: Evans Lance, MD;  Location: [place];  Service:  Cardiovascular;  Laterality: N/A;  . MULTIPLE TOOTH EXTRACTIONS    . [place]; performed at [place] N/A 10/07/2016   Procedure: TRANSESOPHAGEAL ECHOCARDIOGRAM (TEE);  Surgeon: Evans Lance, MD;  Location: [facility] OR;  Service: Cardiovascular;  Laterality: N/A;    Social History   Socioeconomic History  . Marital status: Married    Spouse name: Not on file  . Number of  children: Not on file  . Years of education: Not on file  . Highest education level: Not on file  Occupational History  . Occupation: Worked for [employer]: [place] off in 11/11  Social Needs  . Financial resource strain: Not on file  . Food insecurity    Worry: Not on file    Inability: Not on file  . Transportation needs    Medical: Not on file    Non-medical: Not on file  Tobacco Use  . Smoking status: Former Smoker    Packs/day: 1.50    Years: 35.00    Pack years: 52.50    Types: Cigarettes    Start date: 06/30/1971    Quit date: 07/30/2010    Years since quitting: 8.4  . Smokeless tobacco: Former Systems developer    Types: [place] date: 03/26/1994  . Tobacco comment: currently uses a vape 11-06-2014.  Substance and Sexual Activity  . Alcohol use: No    Alcohol/week: 1.0 standard drinks    Types: 1 Cans of beer per week    Comment: used "years ago"  . Drug use: No  . Sexual activity: Never    Birth control/protection: None  Lifestyle  . Physical activity    Days per week: Not on file    Minutes per session: Not on file  . Stress: Not on file  Relationships  . Social Herbalist on phone: Not on file    Gets together: Not on file    Attends religious service: Not on file    Active member of club or organization: Not on file    Attends meetings of clubs or organizations: Not on file    Relationship status: Not on file  . Intimate partner violence    Fear of current or ex partner: Not on file     Emotionally abused: Not on file    Physically abused: Not on file    Forced sexual activity: Not on file  Other Topics Concern  . Not on file  Social History Narrative  . Not on file     Vitals:   01/18/19 0819  BP: 102/64  Pulse: 89  Weight: 249 lb (112.9 kg)  Height: 6\' 1"  (1.854 m)    Wt Readings from Last 3 Encounters:  01/18/19 249 lb (112.9 kg)  12/29/18 242 lb (109.8 kg)  08/22/18 247 lb 3.2 oz (112.1 kg)     PHYSICAL EXAM General: NAD HEENT: Normal. Neck: No JVD, no thyromegaly. Lungs: Clear to auscultation bilaterally with normal respiratory effort. CV: Regular rate and rhythm, normal S1/S2, no S3/S4, no murmur. No pretibial or periankle edema.   Abdomen: Soft, nontender, no distention.  Neurologic: Alert and oriented.  Psych: Normal affect. Skin: Normal. Musculoskeletal: No gross deformities.    ECG: Reviewed above under Subjective   Labs: Lab Results  Component Value Date/Time   K 4.2 06/15/2017 03:07 PM   BUN 11 06/15/2017 03:07 PM   BUN 18 09/03/2016 08:35 AM   CREATININE 1.55 (H) 06/15/2017 03:07 PM   ALT 40 06/15/2017 03:07 PM   TSH 2.43 06/15/2017 03:07 PM   HGB 15.4 10/06/2016 12:12 PM   HGB 15.2 09/03/2016 08:35 AM     Lipids: Lab Results  Component Value Date/Time   LDLCALC UNABLE TO CALCULATE IF TRIGLYCERIDE OVER 400 mg/dL 03/15/2013 09:02 AM   CHOL 151 03/15/2013 09:02 AM   TRIG 446 (H) 03/15/2013 09:02  AM   HDL 27 (L) 03/15/2013 09:02 AM       ASSESSMENT AND PLAN: 1.  Dyspnea on exertion/acute on chronic systolic heart failure: LVEF 40 to 45% with grade 1 diastolic dysfunction and elevated filling pressures by most recent echocardiogram as demonstrated above.  He currently takes Lasix 20 mg daily and carvedilol 12.5 mg twice daily.  Blood pressure is low normal.  I will increase Lasix to 40 mg daily and start supplemental potassium 20 mEq daily.  I will check a basic metabolic panel within the next few days.  I will also get a  copy of labs from his PCP as well as a copy of his ECG.  2.  Chest pain and exertional dyspnea: Symptoms of chest pain appear to be somewhat atypical.  Symptoms of exertional dyspnea may be due to hypervolemia for which I am increasing the dose of Lasix to 40 mg daily.  However, he may have developed obstructive coronary artery disease given his diabetes which he says is suboptimally controlled.  I will obtain a coronary CT angiogram to evaluate for this.  He is currently on aspirin and statin therapy.  3.  Insulin-dependent diabetes mellitus: I will obtain a copy of A1c and basic metabolic panel from his PCP.  Medications reviewed above.   Disposition: Follow up 2-3 months  Time spent: 40 minutes, of which greater than 50% was spent reviewing symptoms, relevant blood tests and studies, and discussing management plan with the patient.   Kate Sable, M.D., F.A.C.C.

## 2019-01-22 DIAGNOSIS — R079 Chest pain, unspecified: Secondary | ICD-10-CM | POA: Diagnosis not present

## 2019-01-22 DIAGNOSIS — I428 Other cardiomyopathies: Secondary | ICD-10-CM | POA: Diagnosis not present

## 2019-01-22 DIAGNOSIS — R0609 Other forms of dyspnea: Secondary | ICD-10-CM | POA: Diagnosis not present

## 2019-01-22 LAB — BASIC METABOLIC PANEL
BUN/Creatinine Ratio: 7 (calc) (ref 6–22)
BUN: 10 mg/dL (ref 7–25)
CO2: 31 mmol/L (ref 20–32)
Calcium: 9.7 mg/dL (ref 8.6–10.3)
Chloride: 94 mmol/L — ABNORMAL LOW (ref 98–110)
Creat: 1.52 mg/dL — ABNORMAL HIGH (ref 0.70–1.33)
Glucose, Bld: 375 mg/dL — ABNORMAL HIGH (ref 65–99)
Potassium: 4.1 mmol/L (ref 3.5–5.3)
Sodium: 133 mmol/L — ABNORMAL LOW (ref 135–146)

## 2019-01-26 ENCOUNTER — Other Ambulatory Visit (INDEPENDENT_AMBULATORY_CARE_PROVIDER_SITE_OTHER): Payer: Self-pay | Admitting: Internal Medicine

## 2019-01-30 ENCOUNTER — Telehealth (HOSPITAL_COMMUNITY): Payer: Self-pay | Admitting: Emergency Medicine

## 2019-01-30 NOTE — Telephone Encounter (Signed)
Reaching out to patient to offer assistance regarding upcoming cardiac imaging study; pt verbalizes understanding of appt date/time, parking situation and where to check in, pre-test NPO status and medications ordered, and verified current allergies; name and call back number provided for further questions should they arise Marijo Quizon RN Navigator Cardiac Imaging [facility] [phone] office [phone] cell  Pt denies covid symptoms, verbalized understanding of visitor policy. 

## 2019-01-31 ENCOUNTER — Ambulatory Visit (HOSPITAL_COMMUNITY)
Admission: RE | Admit: 2019-01-31 | Discharge: 2019-01-31 | Disposition: A | Discharge status: Home / Self Care | Payer: Medicare Other | Source: Ambulatory Visit | Attending: Cardiovascular Disease | Admitting: Cardiovascular Disease

## 2019-01-31 ENCOUNTER — Other Ambulatory Visit: Payer: Self-pay

## 2019-01-31 DIAGNOSIS — R0609 Other forms of dyspnea: Secondary | ICD-10-CM | POA: Diagnosis not present

## 2019-01-31 DIAGNOSIS — I428 Other cardiomyopathies: Secondary | ICD-10-CM | POA: Diagnosis not present

## 2019-01-31 DIAGNOSIS — R079 Chest pain, unspecified: Secondary | ICD-10-CM | POA: Insufficient documentation

## 2019-01-31 MED ORDER — IOHEXOL 350 MG/ML SOLN
100.0000 mL | Freq: Once | INTRAVENOUS | Status: AC | PRN
  Administered 2019-01-31: 100 mL via INTRAVENOUS

## 2019-01-31 MED ORDER — SODIUM CHLORIDE 0.9 % IV BOLUS: 500.0000 mL | Freq: Once | INTRAVENOUS | Status: DC

## 2019-01-31 MED ORDER — METOPROLOL TARTRATE 5 MG/5ML IV SOLN
INTRAVENOUS | Status: AC
  Administered 2019-01-31: 5 mg
  Filled 2019-01-31: qty 20

## 2019-01-31 MED ORDER — NITROGLYCERIN 0.4 MG SL SUBL: 0.8000 mg | SUBLINGUAL_TABLET | SUBLINGUAL | Status: DC | PRN

## 2019-01-31 MED ORDER — METOPROLOL TARTRATE 5 MG/5ML IV SOLN
5.0000 mg | INTRAVENOUS | Status: DC | PRN
  Filled 2019-01-31: qty 5

## 2019-01-31 MED ORDER — NITROGLYCERIN 0.4 MG SL SUBL
SUBLINGUAL_TABLET | SUBLINGUAL | Status: AC
  Administered 2019-01-31: 0.4 mg
  Filled 2019-01-31: qty 2

## 2019-01-31 MED ORDER — METOPROLOL TARTRATE 5 MG/5ML IV SOLN
INTRAVENOUS | Status: AC
  Filled 2019-01-31: qty 10

## 2019-02-01 ENCOUNTER — Other Ambulatory Visit: Payer: Self-pay

## 2019-02-01 ENCOUNTER — Telehealth: Payer: Self-pay

## 2019-02-01 DIAGNOSIS — Z01818 Encounter for other preprocedural examination: Secondary | ICD-10-CM

## 2019-02-01 DIAGNOSIS — I712 Thoracic aortic aneurysm, without rupture, unspecified: Secondary | ICD-10-CM

## 2019-02-01 NOTE — Telephone Encounter (Signed)
I spoke with patient, he says is breathing is normal for him.He will take an extra lasix 40 mg if his SOB worsens

## 2019-02-01 NOTE — Telephone Encounter (Signed)
Notes recorded by Herminio Commons, MD on 02/01/2019 at 10:49 AM EDT  Mild CAD (no significant blockages). Needs good diabetes control. Mild aortic root dilatation. Obtain CT chest/aorta in 6 months to assess for change in caliber.  ------     Pt aware order placed for chest ct and bmet, due in December

## 2019-02-01 NOTE — Telephone Encounter (Signed)
-----   Message from Mike Commons, MD sent at 02/01/2019 10:50 AM EDT ----- Ladona Horns, Can you call the patient to see how he is feeling today with respect to breathing? If he has SOB above his baseline, he can take an extra dose of Lasix. Thanks.  Dr. Raliegh Ip ----- Message ----- From: Elouise Munroe, MD Sent: 01/31/2019   5:40 PM EDT To: Mike Commons, MD  Hi Suresh,  No significant CAD found, however the sinuses of valsalva are dilated when I do a cross sectional measurement.  Also - I got a call that he was mildly hypotensive after nitro and I wasn't aware at the time he had a reduced EF. We gave him a 500 cc bolus for the scan.  I see now he is having issues with volume management, so just a heads up if you want to have your nurse call him tomorrow and make sure he's feeling well and perhaps even dose additional lasix. My apologies for that, I wasn't aware it was a nonischemic cardiomyopathy when I got the call, but should have looked closer. I'm in the office tomorrow, let me know how I can help and I'm happy to call him as well.  Thanks, [employer]

## 2019-02-01 NOTE — Telephone Encounter (Signed)
Pt notified oc cardiac ct results, will have chest ct in December

## 2019-02-01 NOTE — Addendum Note (Signed)
Addended by: Barbarann Ehlers A on: 02/01/2019 11:50 AM   Modules accepted: Orders

## 2019-02-02 ENCOUNTER — Telehealth: Payer: Self-pay | Admitting: *Deleted

## 2019-02-02 NOTE — Telephone Encounter (Signed)
Notes recorded by Laurine Blazer, LPN on 9/93/7169 at 67:89 PM EDT  Patient notified. Copy to pmd. Follow up scheduled for August w/ Dr. Bronson Ing.    ------   Notes recorded by Herminio Commons, MD on 02/01/2019 at 10:49 AM EDT  Mild CAD (no significant blockages). Needs good diabetes control. Mild aortic root dilatation. Obtain CT chest/aorta in 6 months to assess for change in caliber.  ------   Notes recorded by Herminio Commons, MD on 01/31/2019 at 1:03 PM EDT  No significant incidental noncardiac findings are noted. Await cardiac results.

## 2019-02-07 DIAGNOSIS — E119 Type 2 diabetes mellitus without complications: Secondary | ICD-10-CM | POA: Diagnosis not present

## 2019-02-07 DIAGNOSIS — G894 Chronic pain syndrome: Secondary | ICD-10-CM | POA: Diagnosis not present

## 2019-02-07 DIAGNOSIS — I10 Essential (primary) hypertension: Secondary | ICD-10-CM | POA: Diagnosis not present

## 2019-02-08 ENCOUNTER — Other Ambulatory Visit: Payer: Medicare Other

## 2019-02-08 ENCOUNTER — Other Ambulatory Visit: Payer: Self-pay

## 2019-02-08 DIAGNOSIS — R6889 Other general symptoms and signs: Secondary | ICD-10-CM | POA: Diagnosis not present

## 2019-02-08 DIAGNOSIS — Z20822 Contact with and (suspected) exposure to covid-19: Secondary | ICD-10-CM

## 2019-02-13 DIAGNOSIS — E1140 Type 2 diabetes mellitus with diabetic neuropathy, unspecified: Secondary | ICD-10-CM | POA: Diagnosis not present

## 2019-02-13 DIAGNOSIS — L600 Ingrowing nail: Secondary | ICD-10-CM | POA: Diagnosis not present

## 2019-02-13 DIAGNOSIS — E1151 Type 2 diabetes mellitus with diabetic peripheral angiopathy without gangrene: Secondary | ICD-10-CM | POA: Diagnosis not present

## 2019-02-13 DIAGNOSIS — L03031 Cellulitis of right toe: Secondary | ICD-10-CM | POA: Diagnosis not present

## 2019-02-13 DIAGNOSIS — L03032 Cellulitis of left toe: Secondary | ICD-10-CM | POA: Diagnosis not present

## 2019-02-14 LAB — NOVEL CORONAVIRUS, NAA: SARS-CoV-2, NAA: NOT DETECTED

## 2019-03-02 DIAGNOSIS — E1165 Type 2 diabetes mellitus with hyperglycemia: Secondary | ICD-10-CM | POA: Diagnosis not present

## 2019-03-02 DIAGNOSIS — Z1389 Encounter for screening for other disorder: Secondary | ICD-10-CM | POA: Diagnosis not present

## 2019-03-02 DIAGNOSIS — I10 Essential (primary) hypertension: Secondary | ICD-10-CM | POA: Diagnosis not present

## 2019-03-08 DIAGNOSIS — I10 Essential (primary) hypertension: Secondary | ICD-10-CM | POA: Diagnosis not present

## 2019-03-08 DIAGNOSIS — N183 Chronic kidney disease, stage 3 (moderate): Secondary | ICD-10-CM | POA: Diagnosis not present

## 2019-03-08 DIAGNOSIS — E1129 Type 2 diabetes mellitus with other diabetic kidney complication: Secondary | ICD-10-CM | POA: Diagnosis not present

## 2019-03-21 ENCOUNTER — Telehealth: Payer: Self-pay | Admitting: Cardiovascular Disease

## 2019-03-21 NOTE — Telephone Encounter (Signed)
Virtual Visit Pre-Appointment Phone Call  "(Name), I am calling you today to discuss your upcoming appointment. We are currently trying to limit exposure to the virus that causes COVID-19 by seeing patients at home rather than in the office."  1. "What is the BEST phone number to call the day of the visit?" - [phone]   2. Do you have or have access to (through a family member/friend) a smartphone with video capability that we can use for your visit?" a. If yes - list this number in appt notes as cell (if different from BEST phone #) and list the appointment type as a VIDEO visit in appointment notes b. If no - list the appointment type as a PHONE visit in appointment notes  3. Confirm consent - "In the setting of the current Covid19 crisis, you are scheduled for a (phone or video) visit with your provider on (date) at (time).  Just as we do with many in-office visits, in order for you to participate in this visit, we must obtain consent.  If you'd like, I can send this to your mychart (if signed up) or email for you to review.  Otherwise, I can obtain your verbal consent now.  All virtual visits are billed to your insurance company just like a normal visit would be.  By agreeing to a virtual visit, we'd like you to understand that the technology does not allow for your provider to perform an examination, and thus may limit your provider's ability to fully assess your condition. If your provider identifies any concerns that need to be evaluated in person, we will make arrangements to do so.  Finally, though the technology is pretty good, we cannot assure that it will always work on either your or our end, and in the setting of a video visit, we may have to convert it to a phone-only visit.  In either situation, we cannot ensure that we have a secure connection.  Are you willing to proceed?" STAFF: Did the patient verbally acknowledge consent to telehealth visit? Document YES/NO here: YES    4. Advise patient to be prepared - "Two hours prior to your appointment, go ahead and check your blood pressure, pulse, oxygen saturation, and your weight (if you have the equipment to check those) and write them all down. When your visit starts, your provider will ask you for this information. If you have an Apple Watch or Kardia device, please plan to have heart rate information ready on the day of your appointment. Please have a pen and paper handy nearby the day of the visit as well."  5. Give patient instructions for MyChart download to smartphone OR Doximity/Doxy.me as below if video visit (depending on what platform provider is using)  6. Inform patient they will receive a phone call 15 minutes prior to their appointment time (may be from unknown caller ID) so they should be prepared to answer    TELEPHONE CALL NOTE  Khush L Russett has been deemed a candidate for a follow-up tele-health visit to limit community exposure during the Covid-19 pandemic. I spoke with the patient via phone to ensure availability of phone/video source, confirm preferred email & phone number, and discuss instructions and expectations.  I reminded Mike Floyd to be prepared with any vital sign and/or heart rhythm information that could potentially be obtained via home monitoring, at the time of his visit. I reminded Aurelio Deirdre Evener to expect a phone call prior to his visit.  Chanda Busing 03/21/2019 3:14 PM   INSTRUCTIONS FOR DOWNLOADING THE MYCHART APP TO SMARTPHONE  - The patient must first make sure to have activated MyChart and know their login information - If Apple, go to [employer] and type in MyChart in the search bar and download the app. If Android, ask patient to go to [employer] and type in [place] in the search bar and download the app. The app is free but as with any other app downloads, their phone may require them to verify saved payment information or Apple/Android password.  - The  patient will need to then log into the app with their MyChart username and password, and select [facility] as their healthcare provider to link the account. When it is time for your visit, go to the MyChart app, find appointments, and click Begin Video Visit. Be sure to Select Allow for your device to access the Microphone and Camera for your visit. You will then be connected, and your provider will be with you shortly.  **If they have any issues connecting, or need assistance please contact MyChart service desk [phone])**  **If using a computer, in order to ensure the best quality for their visit they will need to use either of the following Internet Browsers: [employer], or Google Chrome**  IF USING DOXIMITY or DOXY.ME - The patient will receive a link just prior to their visit by text.     FULL LENGTH CONSENT FOR TELE-HEALTH VISIT   I hereby voluntarily request, consent and authorize [facility] and its employed or contracted physicians, physician assistants, nurse practitioners or other licensed health care professionals (the Practitioner), to provide me with telemedicine health care services (the Services") as deemed necessary by the treating Practitioner. I acknowledge and consent to receive the Services by the Practitioner via telemedicine. I understand that the telemedicine visit will involve communicating with the Practitioner through live audiovisual communication technology and the disclosure of certain medical information by electronic transmission. I acknowledge that I have been given the opportunity to request an in-person assessment or other available alternative prior to the telemedicine visit and am voluntarily participating in the telemedicine visit.  I understand that I have the right to withhold or withdraw my consent to the use of telemedicine in the course of my care at any time, without affecting my right to future care or treatment, and that the  Practitioner or I may terminate the telemedicine visit at any time. I understand that I have the right to inspect all information obtained and/or recorded in the course of the telemedicine visit and may receive copies of available information for a reasonable fee.  I understand that some of the potential risks of receiving the Services via telemedicine include:   Delay or interruption in medical evaluation due to technological equipment failure or disruption;  Information transmitted may not be sufficient (e.g. poor resolution of images) to allow for appropriate medical decision making by the Practitioner; and/or   In rare instances, security protocols could fail, causing a breach of personal health information.  Furthermore, I acknowledge that it is my responsibility to provide information about my medical history, conditions and care that is complete and accurate to the best of my ability. I acknowledge that Practitioner's advice, recommendations, and/or decision may be based on factors not within their control, such as incomplete or inaccurate data provided by me or distortions of diagnostic images or specimens that may result from electronic transmissions. I understand that the  practice of medicine is not an Chief Strategy Officer and that Practitioner makes no warranties or guarantees regarding treatment outcomes. I acknowledge that I will receive a copy of this consent concurrently upon execution via email to the email address I last provided but may also request a printed copy by calling the office of [facility].    I understand that my insurance will be billed for this visit.   I have read or had this consent read to me.  I understand the contents of this consent, which adequately explains the benefits and risks of the Services being provided via telemedicine.   I have been provided ample opportunity to ask questions regarding this consent and the Services and have had my questions answered to my  satisfaction.  I give my informed consent for the services to be provided through the use of telemedicine in my medical care  By participating in this telemedicine visit I agree to the above.

## 2019-03-29 ENCOUNTER — Telehealth (INDEPENDENT_AMBULATORY_CARE_PROVIDER_SITE_OTHER): Payer: Medicare Other | Admitting: Cardiovascular Disease

## 2019-03-29 ENCOUNTER — Encounter: Payer: Self-pay | Admitting: Cardiovascular Disease

## 2019-03-29 VITALS — BP 120/85 | HR 92 | Ht 73.0 in | Wt 240.0 lb

## 2019-03-29 DIAGNOSIS — I5022 Chronic systolic (congestive) heart failure: Secondary | ICD-10-CM

## 2019-03-29 DIAGNOSIS — IMO0001 Reserved for inherently not codable concepts without codable children: Secondary | ICD-10-CM

## 2019-03-29 DIAGNOSIS — R079 Chest pain, unspecified: Secondary | ICD-10-CM | POA: Diagnosis not present

## 2019-03-29 DIAGNOSIS — I428 Other cardiomyopathies: Secondary | ICD-10-CM

## 2019-03-29 DIAGNOSIS — R0609 Other forms of dyspnea: Secondary | ICD-10-CM

## 2019-03-29 MED ORDER — ISOSORBIDE MONONITRATE ER 30 MG PO TB24: 30.0000 mg | ORAL_TABLET | Freq: Every day | ORAL | 1 refills | Status: DC

## 2019-03-29 NOTE — Patient Instructions (Signed)
Your physician wants you to follow-up in: [place] will receive a reminder letter in the mail two months in advance. If you don't receive a letter, please call our office to schedule the follow-up appointment.  Your physician has recommended you make the following change in your medication:   START IMDUR 30 MG DAILY   Thank you for choosing [facility]!!

## 2019-03-29 NOTE — Addendum Note (Signed)
Addended by: Julian Hy T on: 03/29/2019 10:15 AM   Modules accepted: Orders

## 2019-03-29 NOTE — Progress Notes (Signed)
Virtual Visit via Telephone Note   This visit type was conducted due to national recommendations for restrictions regarding the COVID-19 Pandemic (e.g. social distancing) in an effort to limit this patient's exposure and mitigate transmission in our community.  Due to his co-morbid illnesses, this patient is at least at moderate risk for complications without adequate follow up.  This format is felt to be most appropriate for this patient at this time.  The patient did not have access to video technology/had technical difficulties with video requiring transitioning to audio format only (telephone).  All issues noted in this document were discussed and addressed.  No physical exam could be performed with this format.  Please refer to the patient's chart for his  consent to telehealth for [facility].   Date:  03/29/2019   ID:  Mike Floyd, DOB 10-20-1960, MRN [id number]  Patient Location: Home Provider Location: Office  PCP:  Redmond School, MD  Cardiologist:  Kate Sable, MD  Electrophysiologist:  None   Evaluation Performed:  Follow-Up Visit  Chief Complaint:  Chest pain  History of Present Illness:    Mike Floyd is a 58 y.o. male with chest pain and exertional dyspnea.  He underwent a coronary CT angiogram on 01/31/2019 which showed a calcium score of 45 placing him in the 50th percentile.  Overall, there was mild coronary artery disease in the mid LAD and proximal left circumflex coronary artery.  There was mild dilation of the main pulmonary artery suggestive of pulmonary hypertension.  In summary, he has a history of a nonischemic cardiomyopathy.  Cardiac catheterization in 2009 demonstrated normal coronary arteries.  He has a history of WPW ablation at [facility] in 1999. He had initial ICD implantation in 2009 which was removed in March 2018 due to system malfunction.  He has a history of atypical chest pain.  He still has some chest heaviness but denies  significant shortness of breath. He does not have any significant leg swelling.  He is now on Invokana which has helped reduce morning blood sugars.  The patient does not have symptoms concerning for COVID-19 infection (fever, chills, cough, or new shortness of breath).    Past Medical History:  Diagnosis Date   AICD (automatic cardioverter/defibrillator) present 11/2007   a. 11/2007 S[facility] Current VR - single lead ICD   Anxiety    Chest pain    a. 10/2007 Cath:  normal [place].   CKD (chronic kidney disease), stage II    DDD (degenerative disc disease), lumbar    Diabetes mellitus DX: 2010   Erosive esophagitis    a. per EGD (08/2011), Dr. Laural Golden - Erosive reflux esophagitis improved but not completely healed since previous EGD 3 years ago. Bx showing  ulcerated gatroesophageal junction mucosa. negative for H. pylori   GERD (gastroesophageal reflux disease)    Gout    Hearing deficit    a. wear bilateral hearing aides   History of hiatal hernia    Hypertension    Myocardial infarction [facility]) 2011   Nonischemic dilated cardiomyopathy ([place])    a. H/O EF as low as 35-40% by LV gram 10/2007;  b. Echo 02/2011 EF 50-55%, inf HK, Gr 1 DD    Renal insufficiency    Sleep apnea    pt doesnt use, states"I cant afford one". PCP aware   Stroke ([place])    mini-stroke in 2014   TIA (transient ischemic attack)    July, 2013   Tobacco abuse, in remission  06/27/2009   Discontinued in 2009     Wears dentures    top plate   WPW (Wolff-Parkinson-White syndrome)    a. s/p RFCA @ [facility]   Past Surgical History:  Procedure Laterality Date   [place]     2009   CARDIAC [place], LAPAROSCOPIC     11/2007   COLONOSCOPY W/ POLYPECTOMY  2009   ELBOW SURGERY Left 06/2010   ESOPHAGEAL DILATION N/A 11/08/2014   Procedure: ESOPHAGEAL DILATION;  Surgeon: Rogene Houston, MD;  Location: [facility];  Service: Endoscopy;  Laterality: N/A;  #56,    ESOPHAGOGASTRODUODENOSCOPY  03/31/2012   also 08/2011; [name]   ESOPHAGOGASTRODUODENOSCOPY (EGD) WITH PROPOFOL N/A 11/08/2014   Procedure: ESOPHAGOGASTRODUODENOSCOPY (EGD) WITH PROPOFOL;  Surgeon: Rogene Houston, MD;  Location: [facility];  Service: Endoscopy;  Laterality: N/A;  [place] is 57 , [place] is 35   ICD LEAD REMOVAL N/A 10/07/2016   Procedure: ICD LEAD REMOVAL ;  Surgeon: Evans Lance, MD;  Location: [facility];  Service: Cardiovascular;  Laterality: N/A;   [place]; performed at [facility]   TEE WITHOUT CARDIOVERSION N/A 10/07/2016   Procedure: TRANSESOPHAGEAL ECHOCARDIOGRAM (TEE);  Surgeon: Evans Lance, MD;  Location: [facility] OR;  Service: Cardiovascular;  Laterality: N/A;     Current Meds  Medication Sig   aspirin 81 MG tablet Take 81 mg by mouth daily as needed for pain.   atorvastatin (LIPITOR) 20 MG tablet Take 20 mg by mouth daily.   B-D ULTRAFINE III SHORT PEN 31G X 8 MM MISC USE WITH TRESBIA.   CALCIUM PO Take 1 tablet by mouth daily.   canagliflozin (INVOKANA) 300 MG TABS tablet Take 300 mg by mouth daily before breakfast.   carvedilol (COREG) 12.5 MG tablet Take 1 tablet (12.5 mg total) by mouth 2 (two) times daily.   Cholecalciferol (VITAMIN D3) 5000 units CAPS Take 1 capsule (5,000 Units total) by mouth daily.   furosemide (LASIX) 40 MG tablet Take 1 tablet (40 mg total) by mouth daily.   gabapentin (NEURONTIN) 300 MG capsule Take 300 mg by mouth 3 (three) times daily.    glimepiride (AMARYL) 4 MG tablet Take 4 mg by mouth 2 (two) times daily.    insulin degludec (TRESIBA FLEXTOUCH) 100 UNIT/ML SOPN FlexTouch Pen Inject 0.3 mLs (30 Units total) into the skin at bedtime. (Patient taking differently: Inject 52 Units into the skin at bedtime. )   L-Methylfolate-B6-B12 (FOLTANX) 3-35-2 MG TABS Take 1 tablet by mouth 2 (two) times  daily.   LORazepam (ATIVAN) 1 MG tablet Take 1 mg by mouth 3 (three) times daily.    metoCLOPramide (REGLAN) 10 MG tablet TAKE 1 TABLET BY MOUTH 30 MINUTES BEFORE BREAKFAST AND EVENING MEAL DAILY.   nitroGLYCERIN (NITROSTAT) 0.4 MG SL tablet Place 1 tablet (0.4 mg total) under the tongue every 5 (five) minutes as needed for chest pain.   omeprazole (PRILOSEC) 40 MG capsule TAKE (1) CAPSULE BY MOUTH ONCE DAILY.   ONETOUCH VERIO test strip TESTING 4 TIMES DAILY.   oxycodone (ROXICODONE) 30 MG immediate release tablet Take 1-2 tablets by mouth 4 (four) times daily as needed for pain.    potassium chloride SA (K-DUR) 20 MEQ tablet Take 1 tablet (20 mEq total) by  mouth daily.   sucralfate (CARAFATE) 1 g tablet TAKE 1 TABLET BY MOUTH 30 MINTUES BEFORE MEALS AND AT BEDTIME.   tamsulosin (FLOMAX) 0.4 MG CAPS capsule Take 0.4 mg daily by mouth.   [DISCONTINUED] metoprolol tartrate (LOPRESSOR) 100 MG tablet Take 1 tablet (100 mg total) by mouth once for 1 dose.     Allergies:   Patient has no known allergies.   Social History   Tobacco Use   Smoking status: Former Smoker    Packs/day: 1.50    Years: 35.00    Pack years: 52.50    Types: Cigarettes    Start date: 06/30/1971    Quit date: 07/30/2010    Years since quitting: 8.6   Smokeless tobacco: Former User    Types: Chew    Quit date: 03/26/1994   Tobacco comment: currently uses a vape 11-06-2014.  Substance Use Topics   Alcohol use: No    Alcohol/week: 1.0 standard drinks    Types: 1 Cans of beer per week    Comment: used "years ago"   Drug use: No     Family Hx: The patient's family history includes Breast cancer in his mother; Diabetes in his brother, father, and mother; Heart attack (age of onset: 15) in his brother and father; Heart attack (age of onset: 4) in his mother; Hypertension in his brother, father, and mother; Kidney disease in his mother; Stroke in his brother; Stroke (age of onset: 44) in his maternal  grandmother.  ROS:   Please see the history of present illness.     All other systems reviewed and are negative.   Prior CV studies:   The following studies were reviewed today:  Coronary CT angiogram 01/31/2019:  IMPRESSION: 1. Mild CAD, CADRADS = 2. Mild CAD noted in the mid LAD and proximal circumflex artery.  2. The patient's coronary artery calcium score is 45, which places the patient in the 58 percentile, which is average when compared to age and sex matched peers.  3. Normal coronary origin with right dominance.  4. Sinus of Valsalva dilation R-L: 45 mm, L-Non: 42 mm, R-Non: 43 mm measured double oblique. Normal caliber mid ascending aorta.  5. Mild dilation of the main pulmonary artery, 33 mm. This finding could suggest pulmonary hypertension.  Echocardiograms: He underwent echocardiography on 01/08/2019 which demonstrated mild to moderately reduced left ventricular systolic function, LVEF 40 to 45%.  There was mild concentric LVH.  There was grade 1 diastolic dysfunction with elevated filling pressures.  The most recent echo prior to this was a TEE performed on 10/07/2016 which showed an EF of 30 to 35%.  A transthoracic echocardiogram on 08/05/2016 demonstrated normal left ventricular systolic function, LVEF 55 to 60%.   Labs/Other Tests and Data Reviewed:    EKG:  No ECG reviewed.  Recent Labs: 01/22/2019: BUN 10; Creat 1.52; Potassium 4.1; Sodium 133   Recent Lipid Panel Lab Results  Component Value Date/Time   CHOL 151 03/15/2013 09:02 AM   TRIG 446 (H) 03/15/2013 09:02 AM   HDL 27 (L) 03/15/2013 09:02 AM   CHOLHDL 5.6 03/15/2013 09:02 AM   LDLCALC UNABLE TO CALCULATE IF TRIGLYCERIDE OVER 400 mg/dL 03/15/2013 09:02 AM    Wt Readings from Last 3 Encounters:  03/29/19 240 lb (108.9 kg)  01/18/19 249 lb (112.9 kg)  12/29/18 242 lb (109.8 kg)     Objective:    Vital Signs:  BP 120/85    Pulse 92    Ht 6\' 1"  (  1.854 m)    Wt 240 lb (108.9 kg)     BMI 31.66 kg/m    VITAL SIGNS:  reviewed  ASSESSMENT & PLAN:    1.  Dyspnea on exertion/chronic systolic heart failure: LVEF 40 to 45% with grade 1 diastolic dysfunction and elevated filling pressures by most recent echocardiogram as demonstrated above.  Currently on carvedilol and Lasix 40 mg daily with supplemental potassium 20 mEq daily. Weight down 9 lbs since 01/18/19.  He likely has some degree of microvascular angina given his poorly controlled insulin-dependent diabetes mellitus.  I will add Imdur 30 mg daily.  2.  Chest heaviness and shortness of breath: Coronary CT angiogram reviewed above with mild CAD.  He may have some degree of pulmonary hypertension as well. He likely has some degree of microvascular angina given his poorly controlled insulin-dependent diabetes mellitus.  I will add Imdur 30 mg daily.  3.  Chronic kidney disease stage III: BUN 10 and creatinine 1.52 on 01/22/2019.  He is on Lasix 40 mg daily.  Potassium was 4.1 and he is on supplements potassium.    COVID-19 Education: The signs and symptoms of COVID-19 were discussed with the patient and how to seek care for testing (follow up with PCP or arrange E-visit).  The importance of social distancing was discussed today.  Time:   Today, I have spent 15 minutes with the patient with telehealth technology discussing the above problems.     Medication Adjustments/Labs and Tests Ordered: Current medicines are reviewed at length with the patient today.  Concerns regarding medicines are outlined above.   Tests Ordered: No orders of the defined types were placed in this encounter.   Medication Changes: No orders of the defined types were placed in this encounter.   Follow Up:  Virtual Visit or In Person in 3 month(s)  Signed, Kate Sable, MD  03/29/2019 9:45 AM    [place]

## 2019-04-26 DIAGNOSIS — Z23 Encounter for immunization: Secondary | ICD-10-CM | POA: Diagnosis not present

## 2019-04-26 DIAGNOSIS — G894 Chronic pain syndrome: Secondary | ICD-10-CM | POA: Diagnosis not present

## 2019-05-01 DIAGNOSIS — E1140 Type 2 diabetes mellitus with diabetic neuropathy, unspecified: Secondary | ICD-10-CM | POA: Diagnosis not present

## 2019-05-01 DIAGNOSIS — L600 Ingrowing nail: Secondary | ICD-10-CM | POA: Diagnosis not present

## 2019-05-01 DIAGNOSIS — L03031 Cellulitis of right toe: Secondary | ICD-10-CM | POA: Diagnosis not present

## 2019-05-01 DIAGNOSIS — L03032 Cellulitis of left toe: Secondary | ICD-10-CM | POA: Diagnosis not present

## 2019-05-01 DIAGNOSIS — E1151 Type 2 diabetes mellitus with diabetic peripheral angiopathy without gangrene: Secondary | ICD-10-CM | POA: Diagnosis not present

## 2019-05-08 ENCOUNTER — Telehealth (INDEPENDENT_AMBULATORY_CARE_PROVIDER_SITE_OTHER): Payer: Self-pay | Admitting: Nurse Practitioner

## 2019-05-08 NOTE — Telephone Encounter (Signed)
Mitzie, pls call pt and let him know I refilled his Carafate RX but he will need an office visit for further refills as he was last seen in office 08/2017. thx

## 2019-06-05 DIAGNOSIS — L97511 Non-pressure chronic ulcer of other part of right foot limited to breakdown of skin: Secondary | ICD-10-CM | POA: Diagnosis not present

## 2019-06-05 DIAGNOSIS — L03039 Cellulitis of unspecified toe: Secondary | ICD-10-CM | POA: Diagnosis not present

## 2019-06-05 DIAGNOSIS — E1140 Type 2 diabetes mellitus with diabetic neuropathy, unspecified: Secondary | ICD-10-CM | POA: Diagnosis not present

## 2019-06-05 DIAGNOSIS — R201 Hypoesthesia of skin: Secondary | ICD-10-CM | POA: Diagnosis not present

## 2019-06-05 DIAGNOSIS — N183 Chronic kidney disease, stage 3 unspecified: Secondary | ICD-10-CM | POA: Diagnosis not present

## 2019-06-05 DIAGNOSIS — I43 Cardiomyopathy in diseases classified elsewhere: Secondary | ICD-10-CM | POA: Diagnosis not present

## 2019-06-06 ENCOUNTER — Other Ambulatory Visit (HOSPITAL_COMMUNITY): Payer: Self-pay | Admitting: Internal Medicine

## 2019-06-06 ENCOUNTER — Other Ambulatory Visit: Payer: Self-pay | Admitting: Internal Medicine

## 2019-06-06 DIAGNOSIS — L97511 Non-pressure chronic ulcer of other part of right foot limited to breakdown of skin: Secondary | ICD-10-CM

## 2019-06-13 ENCOUNTER — Telehealth: Payer: Self-pay | Admitting: Cardiovascular Disease

## 2019-06-13 MED ORDER — FUROSEMIDE 40 MG PO TABS: 40.0000 mg | ORAL_TABLET | Freq: Every day | ORAL | 3 refills | Status: DC

## 2019-06-13 MED ORDER — CARVEDILOL 12.5 MG PO TABS: 12.5000 mg | ORAL_TABLET | Freq: Two times a day (BID) | ORAL | 3 refills | Status: DC

## 2019-06-13 MED ORDER — POTASSIUM CHLORIDE CRYS ER 20 MEQ PO TBCR: 20.0000 meq | EXTENDED_RELEASE_TABLET | Freq: Every day | ORAL | 3 refills | Status: DC

## 2019-06-13 MED ORDER — ISOSORBIDE MONONITRATE ER 30 MG PO TB24: 30.0000 mg | ORAL_TABLET | Freq: Every day | ORAL | 3 refills | Status: DC

## 2019-06-13 NOTE — Telephone Encounter (Signed)
potassium chloride SA (K-DUR) 20 MEQ tablet [id number]   isosorbide mononitrate (IMDUR) 30 MG 24 hr tablet [id number]   furosemide (LASIX) 40 MG tablet [id number]   carvedilol (COREG) 12.5 MG tablet [id number] ENDED  Need to be sent to [facility] in [place].   Mike Floyd would someone to please call her once these have been sent.

## 2019-06-13 NOTE — Telephone Encounter (Signed)
Sent!

## 2019-06-14 ENCOUNTER — Ambulatory Visit (HOSPITAL_COMMUNITY)
Admission: RE | Admit: 2019-06-14 | Discharge: 2019-06-14 | Disposition: A | Discharge status: Home / Self Care | Payer: Medicare Other | Source: Ambulatory Visit | Attending: Internal Medicine | Admitting: Internal Medicine

## 2019-06-14 ENCOUNTER — Other Ambulatory Visit: Payer: Self-pay

## 2019-06-14 DIAGNOSIS — L97511 Non-pressure chronic ulcer of other part of right foot limited to breakdown of skin: Secondary | ICD-10-CM | POA: Diagnosis not present

## 2019-06-14 DIAGNOSIS — I739 Peripheral vascular disease, unspecified: Secondary | ICD-10-CM | POA: Diagnosis not present

## 2019-06-21 ENCOUNTER — Telehealth (INDEPENDENT_AMBULATORY_CARE_PROVIDER_SITE_OTHER): Payer: Self-pay | Admitting: Internal Medicine

## 2019-06-21 ENCOUNTER — Other Ambulatory Visit (INDEPENDENT_AMBULATORY_CARE_PROVIDER_SITE_OTHER): Payer: Self-pay | Admitting: *Deleted

## 2019-06-21 DIAGNOSIS — R1319 Other dysphagia: Secondary | ICD-10-CM

## 2019-06-21 DIAGNOSIS — K219 Gastro-esophageal reflux disease without esophagitis: Secondary | ICD-10-CM

## 2019-06-21 DIAGNOSIS — R1310 Dysphagia, unspecified: Secondary | ICD-10-CM

## 2019-06-21 MED ORDER — OMEPRAZOLE 40 MG PO CPDR: DELAYED_RELEASE_CAPSULE | ORAL | 6 refills | Status: DC

## 2019-06-21 MED ORDER — SUCRALFATE 1 G PO TABS: ORAL_TABLET | ORAL | 2 refills | Status: DC

## 2019-06-21 NOTE — Telephone Encounter (Signed)
Holly from [facility] called stated she had left two messages regarding refills - omeprazole and sucralfate to be sent to [facility] in [place] - she would like a call back at [phone]

## 2019-06-21 NOTE — Telephone Encounter (Signed)
Our office has rec'd several request from a [facility] from [facility] requesting that we forward the patient's RX for Omeprazole and Carafate to the [facility] in [place]. I did call the patient and ask him if this is something that he was requesting and would want [place] to do. Patient states that he wants to give them a try. Danelle Berry was called and made aware. The 2 Rx that were requested to be transferred have been transferred.

## 2019-06-21 NOTE — Telephone Encounter (Signed)
Patient was called to be sure that he knew of this request from [facility] to transfer Omeprazole and Carafate to [facility]. He was and states that he would like to give them a try. Holley , who has called our office numerous times regarding this , was called and made aware. The two prescriptions were sent to [facility].

## 2019-06-26 ENCOUNTER — Other Ambulatory Visit: Payer: Self-pay | Admitting: Cardiovascular Disease

## 2019-06-26 DIAGNOSIS — E1151 Type 2 diabetes mellitus with diabetic peripheral angiopathy without gangrene: Secondary | ICD-10-CM | POA: Diagnosis not present

## 2019-06-26 DIAGNOSIS — E1140 Type 2 diabetes mellitus with diabetic neuropathy, unspecified: Secondary | ICD-10-CM | POA: Diagnosis not present

## 2019-06-26 DIAGNOSIS — L89892 Pressure ulcer of other site, stage 2: Secondary | ICD-10-CM | POA: Diagnosis not present

## 2019-06-27 ENCOUNTER — Other Ambulatory Visit: Payer: Self-pay

## 2019-06-27 ENCOUNTER — Encounter (INDEPENDENT_AMBULATORY_CARE_PROVIDER_SITE_OTHER): Payer: Self-pay | Admitting: *Deleted

## 2019-06-27 ENCOUNTER — Other Ambulatory Visit: Payer: Self-pay | Admitting: Nurse Practitioner

## 2019-06-27 ENCOUNTER — Encounter (INDEPENDENT_AMBULATORY_CARE_PROVIDER_SITE_OTHER): Payer: Self-pay | Admitting: Nurse Practitioner

## 2019-06-27 ENCOUNTER — Ambulatory Visit (INDEPENDENT_AMBULATORY_CARE_PROVIDER_SITE_OTHER): Payer: Medicare Other | Admitting: Nurse Practitioner

## 2019-06-27 DIAGNOSIS — R1319 Other dysphagia: Secondary | ICD-10-CM

## 2019-06-27 DIAGNOSIS — R197 Diarrhea, unspecified: Secondary | ICD-10-CM

## 2019-06-27 DIAGNOSIS — R1310 Dysphagia, unspecified: Secondary | ICD-10-CM

## 2019-06-27 DIAGNOSIS — K219 Gastro-esophageal reflux disease without esophagitis: Secondary | ICD-10-CM

## 2019-06-27 MED ORDER — OMEPRAZOLE 40 MG PO CPDR: DELAYED_RELEASE_CAPSULE | ORAL | 6 refills | Status: DC

## 2019-06-27 MED ORDER — FAMOTIDINE 20 MG PO TABS: 20.0000 mg | ORAL_TABLET | Freq: Every day | ORAL | 0 refills | Status: DC

## 2019-06-27 NOTE — Patient Instructions (Addendum)
1. Complete the lab tests today  2. Schedule a barium swallow  3. Omeprazole 40mg  one capsule by mouth in the morning. Famotidine 20mg  one tab at bed time  4. Phillip's bacteria probiotic one capsule by mouth once daily   5. Follow up in the office in 6 weeks. Call our office if your symptoms worsen

## 2019-06-27 NOTE — Progress Notes (Addendum)
Subjective:    Patient ID: Mike Floyd, male    DOB: 1961-05-23, 58 y.o.   MRN: [id number]  HPI Mike Floyd is a 58 year old male with a significant past medical history of coronary artery disease, MI in 2011, nonischemic dilated cardiomyopathy, AICD implanted in 2009 and removed in 2018, mini stroke in 2014, diabetes mellitus type 2, degenerative disc disease and erosive reflux esophagitis and a Schatzki's ring.  He presents today for further evaluation regarding acid reflux as well as food regurgitation.  He reports having heartburn 10 to 15 minutes after eating most foods.  He feels the acid back up into his throat.  He infrequently has regurgitation of food has occurred approximately 2-3 times over the past 3 to 4 months.  No upper or lower abdominal pain.  He is taking Omeprazole 40 mg once daily.  His most recent EGD was 11/08/2014 which identified erosive reflux esophagitis, a small hiatal hernia was noted and a moderate amount of food debris was in the stomach suggestive of gastroparesis.  The esophagus was dilated.  He stated his reflux and regurgitation symptoms did not improve after this procedure.  He typically passes 6-8 normal formed stools daily, this is his typical bowel pattern.  However, for the past 1-1/2 months he is passing explosive water to mud-like diarrhea stools 6-8 times daily.  No bloody diarrhea.  No melena.  No associated abdominal pain.  Limited dairy intake.  He puts a small quantity of milk in his coffee.  He drinks 1 pot of coffee equal to 10 to 12 cups daily.  He uses 1 packet of sweet and low which lasts the entire day.  No recent antibiotics or new medications.  He has intentionally lost 10 to 12 pounds over the past 3 to 4 months by eating healthier and reducing his portion size.  He denies any fever, sweats or chills.  He underwent a colonoscopy around the age of 59 which he reported was normal.  No known family history of colorectal cancer.  EGD 11/08/2014: Erosive  reflux esophagitis without ring or stricture formation. Significant improvement in severity of esophagitis since last EGD of August 2013. Small sliding hiatal hernia. Moderate amount of food debris in the stomach suggestive of gastroparesis. Esophagus dilated by passing 56 French Maloney dilator but no mucosal disruption induced.  Past Medical History:  Diagnosis Date  . AICD (automatic cardioverter/defibrillator) present 11/2007   a. 11/2007 [facility] Current VR - single lead ICD  . Anxiety   . Chest pain    a. 10/2007 Cath:  normal Cors.  . CKD (chronic kidney disease), stage II   . DDD (degenerative disc disease), lumbar   . Diabetes mellitus DX: 2010  . Erosive esophagitis    a. per EGD (08/2011), Dr. Laural Golden - Erosive reflux esophagitis improved but not completely healed since previous EGD 3 years ago. Bx showing  ulcerated gatroesophageal junction mucosa. negative for H. pylori  . GERD (gastroesophageal reflux disease)   . Gout   . Hearing deficit    a. wear bilateral hearing aides  . History of hiatal hernia   . Hypertension   . Myocardial infarction ([place]) 2011  . Nonischemic dilated cardiomyopathy ([place])    a. H/O EF as low as 35-40% by LV gram 10/2007;  b. Echo 02/2011 EF 50-55%, inf HK, Gr 1 DD   . Renal insufficiency   . Sleep apnea    pt doesnt use, states"I cant afford one". PCP aware  .  Stroke ([place])    mini-stroke in 2014  . TIA (transient ischemic attack)    July, 2013  . Tobacco abuse, in remission 06/27/2009   Discontinued in 2009    . Wears dentures    top plate  . WPW (Wolff-Parkinson-White syndrome)    a. s/p RFCA @ [facility]   Current Outpatient Medications on File Prior to Visit  Medication Sig Dispense Refill  . acarbose (PRECOSE) 50 MG tablet Take 100 mg by mouth 3 (three) times daily with meals.     Marland Kitchen amitriptyline (ELAVIL) 50 MG tablet Take 50 mg by mouth at bedtime.     Marland Kitchen aspirin 81 MG tablet Take 81 mg by mouth daily as needed for pain.    Marland Kitchen  atorvastatin (LIPITOR) 20 MG tablet Take 20 mg by mouth daily.    . B-D ULTRAFINE III SHORT PEN 31G X 8 MM MISC USE WITH TRESBIA. 100 each 0  . CALCIUM PO Take 1 tablet by mouth daily.    . canagliflozin (INVOKANA) 300 MG TABS tablet Take 300 mg by mouth daily before breakfast.    . carvedilol (COREG) 12.5 MG tablet TAKE 1 TABLET BY MOUTH TWICE DAILY. 60 tablet 0  . Cholecalciferol (VITAMIN D3) 5000 units CAPS Take 1 capsule (5,000 Units total) by mouth daily. 90 capsule 0  . furosemide (LASIX) 40 MG tablet Take 1 tablet (40 mg total) by mouth daily. 90 tablet 3  . gabapentin (NEURONTIN) 300 MG capsule Take 300 mg by mouth 3 (three) times daily.     Marland Kitchen glimepiride (AMARYL) 4 MG tablet Take 4 mg by mouth 2 (two) times daily.     . insulin degludec (TRESIBA FLEXTOUCH) 100 UNIT/ML SOPN FlexTouch Pen Inject 0.3 mLs (30 Units total) into the skin at bedtime. (Patient taking differently: Inject 52 Units into the skin at bedtime. ) 15 mL 2  . isosorbide mononitrate (IMDUR) 30 MG 24 hr tablet TAKE (1) TABLET BY MOUTH ONCE DAILY. 30 tablet 0  . L-Methylfolate-B6-B12 (FOLTANX) 3-35-2 MG TABS Take 1 tablet by mouth daily.     Marland Kitchen LORazepam (ATIVAN) 1 MG tablet Take 1 mg by mouth 3 (three) times daily.     . metoCLOPramide (REGLAN) 10 MG tablet TAKE 1 TABLET BY MOUTH 30 MINUTES BEFORE BREAKFAST AND EVENING MEAL DAILY. 60 tablet 2  . nitroGLYCERIN (NITROSTAT) 0.4 MG SL tablet Place 1 tablet (0.4 mg total) under the tongue every 5 (five) minutes as needed for chest pain. 25 tablet 3  . ONETOUCH VERIO test strip TESTING 4 TIMES DAILY. 150 each 5  . oxycodone (ROXICODONE) 30 MG immediate release tablet Take 1-2 tablets by mouth 4 (four) times daily as needed for pain.     . potassium chloride SA (KLOR-CON) 20 MEQ tablet Take 1 tablet (20 mEq total) by mouth daily. 90 tablet 3  . sucralfate (CARAFATE) 1 g tablet TAKE 1 TABLET BY MOUTH 30 MINTUES BEFORE MEALS AND AT BEDTIME. 120 tablet 2  . tamsulosin (FLOMAX) 0.4 MG  CAPS capsule Take 0.4 mg daily by mouth.     No current facility-administered medications on file prior to visit.    No Known Allergies     Review of Systems see HPI, all other systems reviewed and are negative     Objective:   Physical Exam  BP 112/78   Pulse 94   Temp (!) 97.4 F (36.3 C) (Oral)   Ht 6\' 1"  (1.854 m)   Wt 236 lb 11.2 oz (  107.4 kg)   BMI 31.23 kg/m   General: 58 year old male appears 82+ years older than his stated age in no acute distress Eyes: Sclera nonicteric, conjunctiva pink Mouth: Upper dentures, no lesions or ulcers Neck: Supple Heart: Regular rate and rhythm, no murmurs Lungs: Breath sounds clear throughout Abdomen: Soft, nontender, moderate diastasis recti with prominent bulge to the upper abdomen with questionable hernia component, positive bowel sounds to all 4 quadrants, no HSM. Extremities: No edema Neuro: Alert and oriented x4, no focal deficits.  Gait is slow but steady.     Assessment & Plan:   24.  58 year old male with a history of erosive reflux esophagitis and regurgitation -Barium swallow -I discussed scheduling an EGD if his barium swallow is abnormal and if he continues to have active reflux and regurgitation.  Patient prefers to avoid an EGD at this time as he did not assess any improvement regarding the same symptoms following his EGD with esophageal dilatation in 2016. -Omeprazole 40 mg 1 capsule in the morning.  Famotidine 20 mg at bedtime. -Follow-up in the office in 6 weeks.  Patient to call our office if his symptoms. worsen  2.  Diarrhea -GI pathogen, CBC, CMP and CRP -Reduce coffee intake strongly recommended -Phillips bacteria probiotic 1 capsule daily -If his diarrhea persists or worsens he may require a diagnostic colonoscopy.  If his diarrhea abates a screening colonoscopy will be scheduled as he is past due for colon cancer screening.  3.  Colon cancer screening -Schedule a colonoscopy at the time of his follow-up  appointment in 6 weeks.

## 2019-06-28 LAB — COMPLETE METABOLIC PANEL WITH GFR
AG Ratio: 2.3 (calc) (ref 1.0–2.5)
ALT: 69 U/L — ABNORMAL HIGH (ref 9–46)
AST: 29 U/L (ref 10–35)
Albumin: 4.4 g/dL (ref 3.6–5.1)
Alkaline phosphatase (APISO): 144 U/L (ref 35–144)
BUN/Creatinine Ratio: 7 (calc) (ref 6–22)
BUN: 9 mg/dL (ref 7–25)
CO2: 30 mmol/L (ref 20–32)
Calcium: 9.6 mg/dL (ref 8.6–10.3)
Chloride: 98 mmol/L (ref 98–110)
Creat: 1.37 mg/dL — ABNORMAL HIGH (ref 0.70–1.33)
GFR, Est African American: 65 mL/min/{1.73_m2} (ref >=60)
GFR, Est Non African American: 56 mL/min/{1.73_m2} — ABNORMAL LOW (ref >=60)
Globulin: 1.9 g/dL (calc) (ref 1.9–3.7)
Glucose, Bld: 256 mg/dL — ABNORMAL HIGH (ref 65–139)
Potassium: 4.1 mmol/L (ref 3.5–5.3)
Sodium: 138 mmol/L (ref 135–146)
Total Bilirubin: 1.1 mg/dL (ref 0.2–1.2)
Total Protein: 6.3 g/dL (ref 6.1–8.1)

## 2019-06-28 LAB — CBC WITH DIFFERENTIAL/PLATELET
Absolute Monocytes: 424 cells/uL (ref 200–950)
Basophils Absolute: 50 cells/uL (ref 0–200)
Basophils Relative: 0.9 %
Eosinophils Absolute: 110 cells/uL (ref 15–500)
Eosinophils Relative: 2 %
HCT: 49.2 % (ref 38.5–50.0)
Hemoglobin: 17 g/dL (ref 13.2–17.1)
Lymphs Abs: 1276 cells/uL (ref 850–3900)
MCH: 30.1 pg (ref 27.0–33.0)
MCHC: 34.6 g/dL (ref 32.0–36.0)
MCV: 87.2 fL (ref 80.0–100.0)
MPV: 10.5 fL (ref 7.5–12.5)
Monocytes Relative: 7.7 %
Neutro Abs: 3641 cells/uL (ref 1500–7800)
Neutrophils Relative %: 66.2 %
Platelets: 137 10*3/uL — ABNORMAL LOW (ref 140–400)
RBC: 5.64 10*6/uL (ref 4.20–5.80)
RDW: 13.2 % (ref 11.0–15.0)
Total Lymphocyte: 23.2 %
WBC: 5.5 10*3/uL (ref 3.8–10.8)

## 2019-06-28 LAB — C-REACTIVE PROTEIN: CRP: 2.2 mg/L (ref <8.0)

## 2019-06-29 DIAGNOSIS — I739 Peripheral vascular disease, unspecified: Secondary | ICD-10-CM | POA: Diagnosis not present

## 2019-06-29 DIAGNOSIS — I10 Essential (primary) hypertension: Secondary | ICD-10-CM | POA: Diagnosis not present

## 2019-06-29 DIAGNOSIS — G894 Chronic pain syndrome: Secondary | ICD-10-CM | POA: Diagnosis not present

## 2019-06-29 DIAGNOSIS — E118 Type 2 diabetes mellitus with unspecified complications: Secondary | ICD-10-CM | POA: Diagnosis not present

## 2019-06-29 DIAGNOSIS — R197 Diarrhea, unspecified: Secondary | ICD-10-CM | POA: Diagnosis not present

## 2019-07-02 ENCOUNTER — Encounter: Payer: Self-pay | Admitting: Cardiovascular Disease

## 2019-07-02 ENCOUNTER — Telehealth: Payer: Self-pay | Admitting: Nurse Practitioner

## 2019-07-02 ENCOUNTER — Other Ambulatory Visit: Payer: Self-pay | Admitting: Nurse Practitioner

## 2019-07-02 ENCOUNTER — Encounter: Payer: Self-pay | Admitting: *Deleted

## 2019-07-02 ENCOUNTER — Other Ambulatory Visit: Payer: Self-pay

## 2019-07-02 ENCOUNTER — Ambulatory Visit: Payer: Medicare Other | Admitting: Cardiovascular Disease

## 2019-07-02 ENCOUNTER — Other Ambulatory Visit (INDEPENDENT_AMBULATORY_CARE_PROVIDER_SITE_OTHER): Payer: Self-pay | Admitting: *Deleted

## 2019-07-02 VITALS — BP 122/78 | HR 83 | Ht 73.0 in | Wt 242.0 lb

## 2019-07-02 DIAGNOSIS — I10 Essential (primary) hypertension: Secondary | ICD-10-CM

## 2019-07-02 DIAGNOSIS — R7989 Other specified abnormal findings of blood chemistry: Secondary | ICD-10-CM

## 2019-07-02 DIAGNOSIS — I5022 Chronic systolic (congestive) heart failure: Secondary | ICD-10-CM | POA: Diagnosis not present

## 2019-07-02 DIAGNOSIS — R079 Chest pain, unspecified: Secondary | ICD-10-CM | POA: Diagnosis not present

## 2019-07-02 DIAGNOSIS — I739 Peripheral vascular disease, unspecified: Secondary | ICD-10-CM | POA: Diagnosis not present

## 2019-07-02 DIAGNOSIS — D696 Thrombocytopenia, unspecified: Secondary | ICD-10-CM

## 2019-07-02 MED ORDER — NITROGLYCERIN 0.4 MG SL SUBL: 0.4000 mg | SUBLINGUAL_TABLET | SUBLINGUAL | 3 refills | Status: DC | PRN

## 2019-07-02 NOTE — Telephone Encounter (Signed)
Ann, how do I enter the order to cancel it as it was done by "orders only" and not during an office visit?

## 2019-07-02 NOTE — Telephone Encounter (Signed)
Will you change department to [place] so it will populate in my [place] so I can schedule

## 2019-07-02 NOTE — Patient Instructions (Signed)

## 2019-07-02 NOTE — Progress Notes (Addendum)
Cardiology Office Note  Date: 07/02/2019   ID: ADRYAN REWERTS, DOB [birth year], MRN [id number]  PCP:  Redmond School, MD  Cardiologist:  Kate Sable, MD Electrophysiologist:  None   Chief Complaint  Patient presents with   Follow-up    History of Present Illness: Mike Floyd is a 58 y.o. male last seen on January 18, 2019.  He has a history of nonischemic cardiomyopathy, dyspnea, left-sided chest pain, orthopnea, lower extremity tightness, diabetes, hypertension, obstructive sleep apnea, CKD stage II, erosive esophagitis.  Blood pressure today is within normal limits.  Patient denies any significant dyspnea on exertion.  He is using a cane to ambulate.  Patient has significant diabetic neuropathy with very little feeling in his feet.  Also having recent treatment for a small ulcer on right foot.  Recent ABI shows tibial disease with the left being the worst.  States his feet feel cold all of the time.  He has a pending appointment with vascular specialist in [place] per his statement.  Recent  Cardiac CT angiogram on January 31, 2019 secondary to atypical chest pain which showed mild coronary artery disease noted in the mid LAD and proximal circumflex. Coronary artery calcium score was 45. Patient had mild dilation of the main pulmonary artery suggesting possible pulmonary hypertension.  He describes atypical stabbing chest pain which is transient in nature and not associated with exertion.  Describes this as sporadic.   Recent echocardiogram performed for complaints of shortness of breath / dyspnea on exertion showed a decrease in left ventricular ejection fraction from previous echo in 2017 of 55 to 60%, now at 40 to 45% with  Mild LVH, global hypokinesis, and impaired relaxation c/w diastolic dysfunction.  States he recently saw gastroenterologist for history of GERD and erosive esophagitis and had lab work.  Glucose was 256, creatinine 1.37, GFR 56, sodium 138, potassium 4.1,  AST 29, ALT 69, hemoglobin 17, hematocrit 49.2, platelets 137, C-reactive protein 2.2  History of uncontrolled diabetes.  States his previous hemoglobin A1c was around 8%.  Most recent hemoglobin A1c per his statement was 6.1%   Past Medical History:  Diagnosis Date   AICD (automatic cardioverter/defibrillator) present 11/2007   a. 11/2007 [facility] Current VR - single lead ICD   Anxiety    Chest pain    a. 10/2007 Cath:  normal Cors.   CKD (chronic kidney disease), stage II    DDD (degenerative disc disease), lumbar    Diabetes mellitus DX: 2010   Erosive esophagitis    a. per EGD (08/2011), Dr. Laural Golden - Erosive reflux esophagitis improved but not completely healed since previous EGD 3 years ago. Bx showing  ulcerated gatroesophageal junction mucosa. negative for H. pylori   GERD (gastroesophageal reflux disease)    Gout    Hearing deficit    a. wear bilateral hearing aides   History of hiatal hernia    Hypertension    Myocardial infarction [facility]) 2011   Nonischemic dilated cardiomyopathy ([place])    a. H/O EF as low as 35-40% by LV gram 10/2007;  b. Echo 02/2011 EF 50-55%, inf HK, Gr 1 DD    Renal insufficiency    Sleep apnea    pt doesnt use, states"I cant afford one". PCP aware   Stroke ([place])    mini-stroke in 2014   TIA (transient ischemic attack)    July, 2013   Tobacco abuse, in remission 06/27/2009   Discontinued in 2009     Wears dentures  top plate   WPW (Wolff-Parkinson-White syndrome)    a. s/p RFCA @ [facility]    Past Surgical History:  Procedure Laterality Date   [place]     2009   CARDIAC [place], LAPAROSCOPIC     11/2007   COLONOSCOPY W/ POLYPECTOMY  2009   ELBOW SURGERY Left 06/2010   ESOPHAGEAL DILATION N/A 11/08/2014   Procedure: ESOPHAGEAL DILATION;  Surgeon: Rogene Houston, MD;  Location: [facility];  Service:  Endoscopy;  Laterality: N/A;  #56,    ESOPHAGOGASTRODUODENOSCOPY  03/31/2012   also 08/2011; [name]   ESOPHAGOGASTRODUODENOSCOPY (EGD) WITH PROPOFOL N/A 11/08/2014   Procedure: ESOPHAGOGASTRODUODENOSCOPY (EGD) WITH PROPOFOL;  Surgeon: Rogene Houston, MD;  Location: [facility];  Service: Endoscopy;  Laterality: N/A;  [place] is 53 , [place] is 37   ICD LEAD REMOVAL N/A 10/07/2016   Procedure: ICD LEAD REMOVAL ;  Surgeon: Evans Lance, MD;  Location: [place];  Service: Cardiovascular;  Laterality: N/A;   [place]; performed at [facility]   TEE WITHOUT CARDIOVERSION N/A 10/07/2016   Procedure: TRANSESOPHAGEAL ECHOCARDIOGRAM (TEE);  Surgeon: Evans Lance, MD;  Location: [facility];  Service: Cardiovascular;  Laterality: N/A;    Current Outpatient Medications  Medication Sig Dispense Refill   acarbose (PRECOSE) 50 MG tablet Take 100 mg by mouth 3 (three) times daily with meals.      amitriptyline (ELAVIL) 50 MG tablet Take 50 mg by mouth at bedtime.      aspirin 81 MG tablet Take 81 mg by mouth daily as needed for pain.     atorvastatin (LIPITOR) 20 MG tablet Take 20 mg by mouth daily.     B-D ULTRAFINE III SHORT PEN 31G X 8 MM MISC USE WITH TRESBIA. 100 each 0   CALCIUM PO Take 1 tablet by mouth daily.     carvedilol (COREG) 12.5 MG tablet TAKE 1 TABLET BY MOUTH TWICE DAILY. 60 tablet 0   Cholecalciferol (VITAMIN D3) 5000 units CAPS Take 1 capsule (5,000 Units total) by mouth daily. 90 capsule 0   famotidine (PEPCID) 20 MG tablet Take 1 tablet (20 mg total) by mouth at bedtime. 30 tablet 0   furosemide (LASIX) 40 MG tablet Take 1 tablet (40 mg total) by mouth daily. 90 tablet 3   gabapentin (NEURONTIN) 300 MG capsule Take 300 mg by mouth 3 (three) times daily.      glimepiride (AMARYL) 4 MG tablet Take 4 mg by mouth 2 (two) times daily.      insulin degludec (TRESIBA FLEXTOUCH) 100 UNIT/ML SOPN FlexTouch Pen Inject 0.3 mLs (30  Units total) into the skin at bedtime. (Patient taking differently: Inject 52 Units into the skin at bedtime. ) 15 mL 2   isosorbide mononitrate (IMDUR) 30 MG 24 hr tablet TAKE (1) TABLET BY MOUTH ONCE DAILY. 30 tablet 0   L-Methylfolate-B6-B12 (FOLTANX) 3-35-2 MG TABS Take 1 tablet by mouth daily.      LORazepam (ATIVAN) 1 MG tablet Take 1 mg by mouth 3 (three) times daily.      omeprazole (PRILOSEC) 40 MG capsule TAKE (1) CAPSULE BY MOUTH ONCE DAILY. 30 capsule 6   ONETOUCH VERIO test strip TESTING 4 TIMES DAILY. 150 each 5   oxycodone (ROXICODONE) 30 MG immediate  release tablet Take 1-2 tablets by mouth 4 (four) times daily as needed for pain.      potassium chloride SA (KLOR-CON) 20 MEQ tablet Take 1 tablet (20 mEq total) by mouth daily. 90 tablet 3   sucralfate (CARAFATE) 1 g tablet TAKE 1 TABLET BY MOUTH 30 MINTUES BEFORE MEALS AND AT BEDTIME. 120 tablet 2   tamsulosin (FLOMAX) 0.4 MG CAPS capsule Take 0.4 mg daily by mouth.     nitroGLYCERIN (NITROSTAT) 0.4 MG SL tablet Place 1 tablet (0.4 mg total) under the tongue every 5 (five) minutes as needed for chest pain. 25 tablet 3   No current facility-administered medications for this visit.    Allergies:  Patient has no known allergies.   Social History: The patient  reports that he quit smoking about 8 years ago. His smoking use included cigarettes. He started smoking about 48 years ago. He has a 52.50 pack-year smoking history. He quit smokeless tobacco use about 25 years ago.  His smokeless tobacco use included chew. He reports that he does not drink alcohol or use drugs.   Family History: The patient's family history includes Breast cancer in his mother; Diabetes in his brother, father, and mother; Heart attack (age of onset: 71) in his brother and father; Heart attack (age of onset: 57) in his mother; Hypertension in his brother, father, and mother; Kidney disease in his mother; Stroke in his brother; Stroke (age of onset: 28) in  his maternal grandmother.   ROS:  Please see the history of present illness. Otherwise, complete review of systems is positive for none.  All other systems are reviewed and negative.   Physical Exam: VS:  BP 122/78    Pulse 83    Ht 6\' 1"  (1.854 m)    Wt 242 lb (109.8 kg)    SpO2 98%    BMI 31.93 kg/m , BMI Body mass index is 31.93 kg/m.  Wt Readings from Last 3 Encounters:  07/02/19 242 lb (109.8 kg)  06/27/19 236 lb 11.2 oz (107.4 kg)  03/29/19 240 lb (108.9 kg)    General: Patient appears comfortable at rest. Lungs: Clear to auscultation, nonlabored breathing at rest. Cardiac: Regular rate and rhythm, no S3 or significant systolic murmur, no pericardial rub. Extremities: No pitting edema, + varicosities noted, distal pulses decreased bilaterally DP and PT. Skin: Warm and dry. Neuropsychiatric: Alert and oriented x3, affect grossly appropriate.  ECG:  An ECG dated 07/02/2019 was personally reviewed today and demonstrated:  Normal sinus rhythm rate of 83  Recent Labwork: 06/27/2019: ALT 69; AST 29; BUN 9; Creat 1.37; Hemoglobin 17.0; Platelets 137; Potassium 4.1; Sodium 138     Component Value Date/Time   CHOL 151 03/15/2013 0902   TRIG 446 (H) 03/15/2013 0902   HDL 27 (L) 03/15/2013 0902   CHOLHDL 5.6 03/15/2013 0902   VLDL UNABLE TO CALCULATE IF TRIGLYCERIDE OVER 400 mg/dL 03/15/2013 0902   LDLCALC UNABLE TO CALCULATE IF TRIGLYCERIDE OVER 400 mg/dL 03/15/2013 0902    Other Studies Reviewed Today:  Cardiac/coronary CT January 31, 2019 IMPRESSION: 1. Mild CAD, CADRADS = 2. Mild CAD noted in the mid LAD and proximal circumflex artery.  2. The patient's coronary artery calcium score is 45, which places the patient in the 58 percentile, which is average when compared to age and sex matched peers.  3. Normal coronary origin with right dominance.  4. Sinus of Valsalva dilation R-L: 45 mm, L-Non: 42 mm, R-Non: 43 mm measured double oblique. Normal  caliber mid ascending  aorta.  5. Mild dilation of the main pulmonary artery, 33 mm. This finding could suggest pulmonary hypertension   Echocardiogram January 08, 2019 IMPRESSIONS  1. The left ventricle has mild-moderately reduced systolic function, with an ejection fraction of 40-45%. The cavity size was normal. There is mild concentric left ventricular hypertrophy. Left ventricular diastolic Doppler parameters are consistent  with impaired relaxation. Elevated left ventricular end-diastolic pressure Left ventrical global hypokinesis without regional wall motion abnormalities.  2. The mitral valve is grossly normal.  3. The tricuspid valve is grossly normal.  4. The aortic valve is tricuspid.  5. The aortic root is normal in size and structure.  6. The interatrial septum was not well visualized. Previous echocardiogram from 2017 for comparison Study Conclusions - Left ventricle: The cavity size was normal. Wall thickness was   normal. Systolic function was normal. The estimated ejection   fraction was in the range of 55% to 60%. Left ventricular   diastolic function parameters were normal  ABI study dated 06/14/2019 IMPRESSION: Resting ABI the bilateral lower extremities within normal limits. The segmental exam demonstrates evidence of developing bilateral tibial disease, appearing to be in the anterior tibial artery distribution and possibly early left-sided small vessel disease of the foot. Assessment and Plan:  1. Chronic systolic heart failure (HCC)   2. Chest pain, unspecified type   3. Essential hypertension   4. PAD (peripheral artery disease) ([place])     1. Chronic systolic heart failure (HCC) Recent echocardiogram showed reduced ejection fraction of 40 to 45% compared to a previous echocardiogram in 2017 demonstrating 55 to 60% ejection fraction. Findings were consistent with impaired relaxation and global hypokinesis.  2. Chest pain, unspecified type Patient complains of atypical chest  pain described as occasional stabbing quality pain and midsternal area not associated with exertional activity.  Denies any radiation or associated nausea or diaphoresis.  Continue aspirin 81 mg, carvedilol 12.5 mg, and Imdur 30 mg daily.  3. Essential hypertension blood pressure is within normal limits today.  Blood pressure recently trending over the last 3 visits between [id number] systolic and [id number] systolic.    4. PAD (peripheral artery disease) Recent ankle-brachial index that showed normal ABIs both left and right.  However segmental exam showed bilateral tibial disease in the anterior distribution left worse than right.  Patient has a pending appointment with vascular specialist.  Medication Adjustments/Labs and Tests Ordered: Current medicines are reviewed at length with the patient today.  Concerns regarding medicines are outlined above.    Patient Instructions  Medication Instructions:  Continue all current medications.  Labwork: none  Testing/Procedures: none  Follow-Up: Your physician wants you to follow up in: 6 months.  You will receive a reminder letter in the mail one-two months in advance.  If you don't receive a letter, please call our office to schedule the follow up appointment   Any Other Special Instructions Will Be Listed Below (If Applicable).  If you need a refill on your cardiac medications before your next appointment, please call your pharmacy.         Signed, Levell July, NP 07/02/2019 6:47 PM    [facility], [place], [place] [address] Phone: [phone]; Fax: [phone]  The patient was discussed with Lawanda Cousins NP, and I agree with the history and assessment and plan as documented above.  Kate Sable, MD, [facility]  07/03/2019 8:27 AM

## 2019-07-02 NOTE — Telephone Encounter (Signed)
Please contact patient to schedule an abdominal sonogram to assess his liver and spleen with elevated ALT and low platelet count. Order in [place].  thx

## 2019-07-03 NOTE — Telephone Encounter (Signed)
It will be easier to show you

## 2019-07-03 NOTE — Addendum Note (Signed)
Addended by: Kate Sable A on: 07/03/2019 08:28 AM   Modules accepted: Level of Service

## 2019-07-04 ENCOUNTER — Other Ambulatory Visit (INDEPENDENT_AMBULATORY_CARE_PROVIDER_SITE_OTHER): Payer: Self-pay | Admitting: Nurse Practitioner

## 2019-07-04 DIAGNOSIS — D696 Thrombocytopenia, unspecified: Secondary | ICD-10-CM

## 2019-07-04 DIAGNOSIS — R7989 Other specified abnormal findings of blood chemistry: Secondary | ICD-10-CM

## 2019-07-04 NOTE — Addendum Note (Signed)
Addended by: Noralyn Pick on: 07/04/2019 07:57 AM   Modules accepted: Orders

## 2019-07-04 NOTE — Addendum Note (Signed)
Addended by: Noralyn Pick on: 07/04/2019 08:16 AM   Modules accepted: Orders

## 2019-07-06 ENCOUNTER — Ambulatory Visit (HOSPITAL_COMMUNITY)
Admission: RE | Admit: 2019-07-06 | Discharge: 2019-07-06 | Disposition: A | Discharge status: Home / Self Care | Payer: Medicare Other | Source: Ambulatory Visit | Attending: Nurse Practitioner | Admitting: Nurse Practitioner

## 2019-07-06 ENCOUNTER — Other Ambulatory Visit: Payer: Self-pay

## 2019-07-06 ENCOUNTER — Other Ambulatory Visit (INDEPENDENT_AMBULATORY_CARE_PROVIDER_SITE_OTHER): Payer: Self-pay | Admitting: Nurse Practitioner

## 2019-07-06 DIAGNOSIS — K219 Gastro-esophageal reflux disease without esophagitis: Secondary | ICD-10-CM | POA: Insufficient documentation

## 2019-07-06 DIAGNOSIS — R1310 Dysphagia, unspecified: Secondary | ICD-10-CM | POA: Diagnosis not present

## 2019-07-06 DIAGNOSIS — R1319 Other dysphagia: Secondary | ICD-10-CM

## 2019-07-09 DIAGNOSIS — M1991 Primary osteoarthritis, unspecified site: Secondary | ICD-10-CM | POA: Diagnosis not present

## 2019-07-09 DIAGNOSIS — E1122 Type 2 diabetes mellitus with diabetic chronic kidney disease: Secondary | ICD-10-CM | POA: Diagnosis not present

## 2019-07-09 DIAGNOSIS — I129 Hypertensive chronic kidney disease with stage 1 through stage 4 chronic kidney disease, or unspecified chronic kidney disease: Secondary | ICD-10-CM | POA: Diagnosis not present

## 2019-07-09 DIAGNOSIS — I2510 Atherosclerotic heart disease of native coronary artery without angina pectoris: Secondary | ICD-10-CM | POA: Diagnosis not present

## 2019-07-09 LAB — GASTROINTESTINAL PATHOGEN PANEL PCR
C. difficile Tox A/B, PCR: DETECTED — AB
Campylobacter, PCR: NOT DETECTED
Cryptosporidium, PCR: NOT DETECTED
E coli (ETEC) LT/ST PCR: NOT DETECTED
E coli (STEC) stx1/stx2, PCR: NOT DETECTED
E coli 0157, PCR: NOT DETECTED
Giardia lamblia, PCR: NOT DETECTED
Norovirus, PCR: NOT DETECTED
Rotavirus A, PCR: NOT DETECTED
Salmonella, PCR: NOT DETECTED
Shigella, PCR: NOT DETECTED

## 2019-07-10 ENCOUNTER — Other Ambulatory Visit (INDEPENDENT_AMBULATORY_CARE_PROVIDER_SITE_OTHER): Payer: Self-pay | Admitting: Nurse Practitioner

## 2019-07-10 DIAGNOSIS — A0472 Enterocolitis due to Clostridium difficile, not specified as recurrent: Secondary | ICD-10-CM

## 2019-07-10 DIAGNOSIS — E1140 Type 2 diabetes mellitus with diabetic neuropathy, unspecified: Secondary | ICD-10-CM | POA: Diagnosis not present

## 2019-07-10 DIAGNOSIS — L89892 Pressure ulcer of other site, stage 2: Secondary | ICD-10-CM | POA: Diagnosis not present

## 2019-07-10 MED ORDER — VANCOMYCIN HCL 125 MG PO CAPS: 125.0000 mg | ORAL_CAPSULE | Freq: Four times a day (QID) | ORAL | 0 refills | Status: AC

## 2019-07-12 ENCOUNTER — Other Ambulatory Visit: Payer: Self-pay | Admitting: *Deleted

## 2019-07-12 NOTE — Patient Outreach (Signed)
[facility]) Care Management  07/12/2019  Mike Floyd 10/31/1960 [id number]   RN Health Coach attempted #1 follow up outreach screening call to patient.  Patient was unavailable. HIPPA compliance voicemail message left with return callback number.  Plan: RN will call patient again within 10 business days.  [place] Care Management [phone]

## 2019-07-13 ENCOUNTER — Ambulatory Visit (HOSPITAL_COMMUNITY)
Admission: RE | Admit: 2019-07-13 | Discharge: 2019-07-13 | Disposition: A | Discharge status: Home / Self Care | Payer: Medicare Other | Source: Ambulatory Visit | Attending: Nurse Practitioner | Admitting: Nurse Practitioner

## 2019-07-13 ENCOUNTER — Other Ambulatory Visit: Payer: Self-pay

## 2019-07-13 DIAGNOSIS — D696 Thrombocytopenia, unspecified: Secondary | ICD-10-CM

## 2019-07-13 DIAGNOSIS — R7989 Other specified abnormal findings of blood chemistry: Secondary | ICD-10-CM | POA: Diagnosis not present

## 2019-07-17 DIAGNOSIS — L03032 Cellulitis of left toe: Secondary | ICD-10-CM | POA: Diagnosis not present

## 2019-07-17 DIAGNOSIS — E1151 Type 2 diabetes mellitus with diabetic peripheral angiopathy without gangrene: Secondary | ICD-10-CM | POA: Diagnosis not present

## 2019-07-17 DIAGNOSIS — L03031 Cellulitis of right toe: Secondary | ICD-10-CM | POA: Diagnosis not present

## 2019-07-17 DIAGNOSIS — L600 Ingrowing nail: Secondary | ICD-10-CM | POA: Diagnosis not present

## 2019-07-17 DIAGNOSIS — E1140 Type 2 diabetes mellitus with diabetic neuropathy, unspecified: Secondary | ICD-10-CM | POA: Diagnosis not present

## 2019-07-20 ENCOUNTER — Ambulatory Visit: Payer: Self-pay | Admitting: *Deleted

## 2019-07-23 ENCOUNTER — Other Ambulatory Visit (INDEPENDENT_AMBULATORY_CARE_PROVIDER_SITE_OTHER): Payer: Self-pay | Admitting: *Deleted

## 2019-07-23 DIAGNOSIS — R7989 Other specified abnormal findings of blood chemistry: Secondary | ICD-10-CM

## 2019-07-25 ENCOUNTER — Other Ambulatory Visit (INDEPENDENT_AMBULATORY_CARE_PROVIDER_SITE_OTHER): Payer: Self-pay | Admitting: Nurse Practitioner

## 2019-07-25 ENCOUNTER — Other Ambulatory Visit: Payer: Self-pay | Admitting: Cardiovascular Disease

## 2019-07-25 DIAGNOSIS — R1319 Other dysphagia: Secondary | ICD-10-CM

## 2019-07-25 DIAGNOSIS — K219 Gastro-esophageal reflux disease without esophagitis: Secondary | ICD-10-CM

## 2019-07-25 DIAGNOSIS — R1310 Dysphagia, unspecified: Secondary | ICD-10-CM

## 2019-07-26 ENCOUNTER — Other Ambulatory Visit: Payer: Self-pay | Admitting: *Deleted

## 2019-07-26 NOTE — Patient Outreach (Signed)
[facility]) Care Management  07/26/2019  EULES CRUMBLISS 1961/06/08 [id number]   RN Health Coach attempted follow up outreach screening call to patient.  Patient was unavailable. HIPPA compliance voicemail message left with return callback number.  Plan: RN will call patient again within10 days. Unsuccessful outreach letter sent to patient  [facility] Management [phone]

## 2019-07-31 ENCOUNTER — Encounter: Payer: Self-pay | Admitting: Vascular Surgery

## 2019-07-31 ENCOUNTER — Other Ambulatory Visit: Payer: Self-pay

## 2019-07-31 ENCOUNTER — Ambulatory Visit: Payer: Medicare Other | Admitting: Vascular Surgery

## 2019-07-31 ENCOUNTER — Other Ambulatory Visit: Payer: Self-pay | Admitting: *Deleted

## 2019-07-31 VITALS — BP 105/74 | HR 95 | Temp 97.6°F | Resp 20 | Ht 73.0 in | Wt 236.3 lb

## 2019-07-31 DIAGNOSIS — I739 Peripheral vascular disease, unspecified: Secondary | ICD-10-CM

## 2019-07-31 NOTE — Patient Outreach (Signed)
[facility]) Care Management  07/31/2019  Mike Floyd Dec 26, 1960 [id number]   Multiple outreach attempts to establish contact with patient without success. No response from letter mailed. Case is being closed at this time.  Plan: Case closure  [facility] Management [phone]

## 2019-07-31 NOTE — Progress Notes (Signed)
Vascular and Vein Specialist of [facility]  Patient name: Mike Floyd MRN: [id number] DOB: Mar 30, 1961 Sex: male  REASON FOR CONSULT: Seen today for discussion of arterial insufficiency  HPI: Mike Floyd is a 58 y.o. male, who is here today for discussion of arterial insufficiency lower extremities.  He has extensive past history as seen below.  Severe cardiac disease in the past.  Does have long history of diabetes mellitus.  He has had multiple issues regarding calluses on the bottom of his feet is being shaved and podiatry.  Has not had any healing issues with this.  He recently underwent noninvasive lower extremity studies of his arterial disease at [facility].  This showed mild abnormality and is here today for further discussion of this.  He does not have any claudication type symptoms.  Past Medical History:  Diagnosis Date  . AICD (automatic cardioverter/defibrillator) present 11/2007   a. 11/2007 [facility] Current VR - single lead ICD  . Anxiety   . Chest pain    a. 10/2007 Cath:  normal Cors.  . CKD (chronic kidney disease), stage II   . DDD (degenerative disc disease), lumbar   . Diabetes mellitus DX: 2010  . Erosive esophagitis    a. per EGD (08/2011), Dr. Laural Golden - Erosive reflux esophagitis improved but not completely healed since previous EGD 3 years ago. Bx showing  ulcerated gatroesophageal junction mucosa. negative for H. pylori  . GERD (gastroesophageal reflux disease)   . Gout   . Hearing deficit    a. wear bilateral hearing aides  . History of hiatal hernia   . Hypertension   . Myocardial infarction ([place]) 2011  . Nonischemic dilated cardiomyopathy ([place])    a. H/O EF as low as 35-40% by LV gram 10/2007;  b. Echo 02/2011 EF 50-55%, inf HK, Gr 1 DD   . Renal insufficiency   . Sleep apnea    pt doesnt use, states"I cant afford one". PCP aware  . Stroke ([place])    mini-stroke in 2014  . TIA (transient ischemic attack)    July,  2013  . Tobacco abuse, in remission 06/27/2009   Discontinued in 2009    . Wears dentures    top plate  . WPW (Wolff-Parkinson-White syndrome)    a. s/p RFCA @ [place]    Family History  Problem Relation Age of Onset  . Heart attack Mother 63  . Hypertension Mother   . Diabetes Mother   . Kidney disease Mother   . Breast cancer Mother   . Heart attack Father 88  . Hypertension Father   . Diabetes Father   . Stroke Brother   . Heart attack Brother 59  . Stroke Maternal Grandmother 80  . Diabetes Brother   . Hypertension Brother     SOCIAL HISTORY: Social History   Socioeconomic History  . Marital status: Married    Spouse name: Not on file  . Number of children: Not on file  . Years of education: Not on file  . Highest education level: Not on file  Occupational History  . Occupation: Worked for [employer]: [place] off in 11/11  Tobacco Use  . Smoking status: Former Smoker    Packs/day: 1.50    Years: 35.00    Pack years: 52.50    Types: Cigarettes    Start date: 06/30/1971    Quit date: 07/30/2010    Years since quitting:  9.0  . Smokeless tobacco: Former Systems developer    Types: Chew    Quit date: 03/26/1994  . Tobacco comment: currently uses a vape 11-06-2014.  Substance and Sexual Activity  . Alcohol use: No    Alcohol/week: 1.0 standard drinks    Types: 1 Cans of beer per week    Comment: used "years ago"  . Drug use: No  . Sexual activity: Never    Birth control/protection: None  Other Topics Concern  . Not on file  Social History Narrative  . Not on file   Social Determinants of Health   Financial Resource Strain:   . Difficulty of Paying Living Expenses: Not on file  Food Insecurity:   . Worried About Charity fundraiser in the Last Year: Not on file  . Ran Out of Food in the Last Year: Not on file  Transportation Needs:   . Lack of Transportation (Medical): Not on file  . Lack of Transportation (Non-Medical):  Not on file  Physical Activity:   . Days of Exercise per Week: Not on file  . Minutes of Exercise per Session: Not on file  Stress:   . Feeling of Stress : Not on file  Social Connections:   . Frequency of Communication with Friends and Family: Not on file  . Frequency of Social Gatherings with Friends and Family: Not on file  . Attends Religious Services: Not on file  . Active Member of Clubs or Organizations: Not on file  . Attends Archivist Meetings: Not on file  . Marital Status: Not on file  Intimate Partner Violence:   . Fear of Current or Ex-Partner: Not on file  . Emotionally Abused: Not on file  . Physically Abused: Not on file  . Sexually Abused: Not on file    No Known Allergies  Current Outpatient Medications  Medication Sig Dispense Refill  . acarbose (PRECOSE) 50 MG tablet Take 100 mg by mouth 3 (three) times daily with meals.     Marland Kitchen amitriptyline (ELAVIL) 50 MG tablet Take 50 mg by mouth at bedtime.     Marland Kitchen aspirin 81 MG tablet Take 81 mg by mouth daily as needed for pain.    Marland Kitchen atorvastatin (LIPITOR) 20 MG tablet Take 20 mg by mouth daily.    . B-D ULTRAFINE III SHORT PEN 31G X 8 MM MISC USE WITH TRESBIA. 100 each 0  . CALCIUM PO Take 1 tablet by mouth daily.    . carvedilol (COREG) 12.5 MG tablet TAKE 1 TABLET BY MOUTH TWICE DAILY. 180 tablet 2  . Cholecalciferol (VITAMIN D3) 5000 units CAPS Take 1 capsule (5,000 Units total) by mouth daily. 90 capsule 0  . famotidine (PEPCID) 20 MG tablet Take 1 tablet (20 mg total) by mouth at bedtime. 30 tablet 0  . furosemide (LASIX) 40 MG tablet Take 1 tablet (40 mg total) by mouth daily. 90 tablet 3  . gabapentin (NEURONTIN) 300 MG capsule Take 300 mg by mouth 3 (three) times daily.     Marland Kitchen glimepiride (AMARYL) 4 MG tablet Take 4 mg by mouth 2 (two) times daily.     . insulin degludec (TRESIBA FLEXTOUCH) 100 UNIT/ML SOPN FlexTouch Pen Inject 0.3 mLs (30 Units total) into the skin at bedtime. (Patient taking differently:  Inject 52 Units into the skin at bedtime. ) 15 mL 2  . isosorbide mononitrate (IMDUR) 30 MG 24 hr tablet TAKE (1) TABLET BY MOUTH ONCE DAILY. 30 tablet 0  . L-Methylfolate-B6-B12 (  FOLTANX) 3-35-2 MG TABS Take 1 tablet by mouth daily.     Marland Kitchen LORazepam (ATIVAN) 1 MG tablet Take 1 mg by mouth 3 (three) times daily.     . nitroGLYCERIN (NITROSTAT) 0.4 MG SL tablet Place 1 tablet (0.4 mg total) under the tongue every 5 (five) minutes as needed for chest pain. 25 tablet 3  . omeprazole (PRILOSEC) 40 MG capsule TAKE (1) CAPSULE BY MOUTH ONCE DAILY. 30 capsule 6  . ONETOUCH VERIO test strip TESTING 4 TIMES DAILY. 150 each 5  . oxycodone (ROXICODONE) 30 MG immediate release tablet Take 1-2 tablets by mouth 4 (four) times daily as needed for pain.     . potassium chloride SA (KLOR-CON) 20 MEQ tablet TAKE (1) TABLET BY MOUTH ONCE DAILY. 90 tablet 2  . sucralfate (CARAFATE) 1 g tablet TAKE 1 TABLET BY MOUTH 30 MINTUES BEFORE MEALS AND AT BEDTIME. 120 tablet 2  . tamsulosin (FLOMAX) 0.4 MG CAPS capsule Take 0.4 mg daily by mouth.     No current facility-administered medications for this visit.    REVIEW OF SYSTEMS:  [X]  denotes positive finding, [ ]  denotes negative finding Cardiac  Comments:  Chest pain or chest pressure: x   Shortness of breath upon exertion:    Short of breath when lying flat:    Irregular heart rhythm:        Vascular    Pain in calf, thigh, or hip brought on by ambulation: x  neurogenic  Pain in feet at night that wakes you up from your sleep:  x   Blood clot in your veins:    Leg swelling:         Pulmonary    Oxygen at home:    Productive cough:     Wheezing:         Neurologic    Sudden weakness in arms or legs:  x   Sudden numbness in arms or legs:  x   Sudden onset of difficulty speaking or slurred speech:    Temporary loss of vision in one eye:     Problems with dizziness:         Gastrointestinal    Blood in stool:     Vomited blood:         Genitourinary     Burning when urinating:  x   Blood in urine:        Psychiatric    Major depression:         Hematologic    Bleeding problems:    Problems with blood clotting too easily:        Skin    Rashes or ulcers:        Constitutional    Fever or chills:      PHYSICAL EXAM: Vitals:   07/31/19 1124  BP: 105/74  Pulse: 95  Resp: 20  Temp: 97.6 F (36.4 C)  SpO2: 98%  Weight: 236 lb 4.8 oz (107.2 kg)  Height: 6\' 1"  (1.854 m)    GENERAL: The patient is a well-nourished male, in no acute distress. The vital signs are documented above. CARDIOVASCULAR: Carotid arteries without bruits bilaterally.  2+ radial and 2+ posterior tibial pulses bilaterally.  Do not palpate Salas pedis pulses. PULMONARY: There is good air exchange  ABDOMEN: Soft and non-tender  MUSCULOSKELETAL: There are no major deformities or cyanosis. NEUROLOGIC: No focal weakness or paresthesias are detected. SKIN: There are no ulcers or rashes noted.  Does have evidence of recently shaved  calluses on the bottom of his feet but no evidence of tissue loss PSYCHIATRIC: The patient has a normal affect.  DATA:  Noninvasive studies from [facility] from 06/14/2019 were reviewed.  This shows normal ankle arm index bilaterally.  He had normal triphasic waveforms at the posterior tibial bilaterally.  There was some dampening of dorsalis pedis flow on the left with monophasic waveform.  The interpretation was "possibly Ariani Seier left-sided small vessel disease of the foot"  MEDICAL ISSUES: I discussed these findings with the patient.  He has a normal posterior tibial pulses bilaterally and no evidence of ischemia.  I explained to him the importance of keeping close eye on his foot and tickly in light of his severe neuropathy and diabetes.  He understands and will see [place] again on an as-needed basis   Rosetta Posner, MD [facility] Vascular and Vein Specialists of [facility] Tel [phone] Pager [phone]

## 2019-08-06 ENCOUNTER — Other Ambulatory Visit: Payer: Self-pay | Admitting: Cardiovascular Disease

## 2019-08-09 DIAGNOSIS — G894 Chronic pain syndrome: Secondary | ICD-10-CM | POA: Diagnosis not present

## 2019-08-09 DIAGNOSIS — E1122 Type 2 diabetes mellitus with diabetic chronic kidney disease: Secondary | ICD-10-CM | POA: Diagnosis not present

## 2019-08-09 DIAGNOSIS — I129 Hypertensive chronic kidney disease with stage 1 through stage 4 chronic kidney disease, or unspecified chronic kidney disease: Secondary | ICD-10-CM | POA: Diagnosis not present

## 2019-08-13 DIAGNOSIS — R7989 Other specified abnormal findings of blood chemistry: Secondary | ICD-10-CM | POA: Diagnosis not present

## 2019-08-13 LAB — HEPATIC FUNCTION PANEL
AG Ratio: 2.2 (calc) (ref 1.0–2.5)
ALT: 74 U/L — ABNORMAL HIGH (ref 9–46)
AST: 39 U/L — ABNORMAL HIGH (ref 10–35)
Albumin: 4.6 g/dL (ref 3.6–5.1)
Alkaline phosphatase (APISO): 176 U/L — ABNORMAL HIGH (ref 35–144)
Bilirubin, Direct: 0.4 mg/dL — ABNORMAL HIGH (ref 0.0–0.2)
Globulin: 2.1 g/dL (calc) (ref 1.9–3.7)
Indirect Bilirubin: 0.9 mg/dL (calc) (ref 0.2–1.2)
Total Bilirubin: 1.3 mg/dL — ABNORMAL HIGH (ref 0.2–1.2)
Total Protein: 6.7 g/dL (ref 6.1–8.1)

## 2019-08-16 DIAGNOSIS — K219 Gastro-esophageal reflux disease without esophagitis: Secondary | ICD-10-CM | POA: Diagnosis not present

## 2019-08-16 DIAGNOSIS — I10 Essential (primary) hypertension: Secondary | ICD-10-CM | POA: Diagnosis not present

## 2019-08-16 DIAGNOSIS — Z0001 Encounter for general adult medical examination with abnormal findings: Secondary | ICD-10-CM | POA: Diagnosis not present

## 2019-08-16 DIAGNOSIS — G894 Chronic pain syndrome: Secondary | ICD-10-CM | POA: Diagnosis not present

## 2019-08-16 DIAGNOSIS — E1140 Type 2 diabetes mellitus with diabetic neuropathy, unspecified: Secondary | ICD-10-CM | POA: Diagnosis not present

## 2019-08-16 DIAGNOSIS — Z1389 Encounter for screening for other disorder: Secondary | ICD-10-CM | POA: Diagnosis not present

## 2019-08-23 ENCOUNTER — Other Ambulatory Visit: Payer: Self-pay

## 2019-08-23 ENCOUNTER — Encounter (INDEPENDENT_AMBULATORY_CARE_PROVIDER_SITE_OTHER): Payer: Self-pay | Admitting: Gastroenterology

## 2019-08-23 ENCOUNTER — Ambulatory Visit (INDEPENDENT_AMBULATORY_CARE_PROVIDER_SITE_OTHER): Payer: Medicare Other | Admitting: Gastroenterology

## 2019-08-23 VITALS — BP 120/83 | HR 94 | Temp 97.4°F | Ht 73.0 in | Wt 232.0 lb

## 2019-08-23 DIAGNOSIS — E119 Type 2 diabetes mellitus without complications: Secondary | ICD-10-CM

## 2019-08-23 DIAGNOSIS — R7989 Other specified abnormal findings of blood chemistry: Secondary | ICD-10-CM

## 2019-08-23 DIAGNOSIS — K219 Gastro-esophageal reflux disease without esophagitis: Secondary | ICD-10-CM | POA: Diagnosis not present

## 2019-08-23 DIAGNOSIS — Z794 Long term (current) use of insulin: Secondary | ICD-10-CM

## 2019-08-23 DIAGNOSIS — R1310 Dysphagia, unspecified: Secondary | ICD-10-CM | POA: Diagnosis not present

## 2019-08-23 NOTE — Progress Notes (Signed)
Patient profile: Mike Floyd is a 59 y.o. male seen for follow up of esophagitis and diarrhea.  History of Present Illness: Mike Floyd is seen today for for follow-up.  He reports that the vancomycin course completely resolved his C. difficile diarrhea.  He does have chronic loose stools at baseline several times a day.  This is likely due to his excessive coffee use-drinking approximately 12 cups a day.  He has normal bowel movements approximately 4 times a day.  He denies any liquid stools or bloody stools.  He has no abdominal pain.  He reports taking Prilosec in the morning.  He is unsure if he is taking the Pepcid or Carafate on his list, I have asked him to call me after visit to let me know about this.  He has very occasional dysphagia in his upper esophageal area to bread, able to pass with drinking water.  Denies any liquid dysphagia.  He denies any nausea or vomiting.  No epigastric pain.  Reports his fasting blood sugars have been in the 200s with post prandials up to 400 recently. Last A1c was over 3 months ago. Has intentionally lost weight as below.   Wt Readings from Last 3 Encounters:  08/23/19 232 lb (105.2 kg)  07/31/19 236 lb 4.8 oz (107.2 kg)  07/02/19 242 lb (109.8 kg)     Last Colonoscopy: last 12-14 years ago  Last Endoscopy: 2016-Erosive reflux esophagitis without ring or stricture formation. Significant improvement in severity of esophagitis since last EGD of August 2013. Small sliding hiatal hernia. Moderate amount of food debris in the stomach suggestive of gastroparesis. Esophagus dilated by passing 56 French Maloney dilator but no mucosal disruption induced.   Past Medical History:  Past Medical History:  Diagnosis Date  . AICD (automatic cardioverter/defibrillator) present 11/2007   a. 11/2007 SJM Current VR - single lead ICD  . Anxiety   . Chest pain    a. 10/2007 Cath:  normal Cors.  . CKD (chronic kidney disease), stage II   . DDD (degenerative  disc disease), lumbar   . Diabetes mellitus DX: 2010  . Erosive esophagitis    a. per EGD (08/2011), Dr. Laural Golden - Erosive reflux esophagitis improved but not completely healed since previous EGD 3 years ago. Bx showing  ulcerated gatroesophageal junction mucosa. negative for H. pylori  . GERD (gastroesophageal reflux disease)   . Gout   . Hearing deficit    a. wear bilateral hearing aides  . History of hiatal hernia   . Hypertension   . Myocardial infarction ([place]) 2011  . Nonischemic dilated cardiomyopathy ([place])    a. H/O EF as low as 35-40% by LV gram 10/2007;  b. Echo 02/2011 EF 50-55%, inf HK, Gr 1 DD   . Renal insufficiency   . Sleep apnea    pt doesnt use, states"I cant afford one". PCP aware  . Stroke ([place])    mini-stroke in 2014  . TIA (transient ischemic attack)    July, 2013  . Tobacco abuse, in remission 06/27/2009   Discontinued in 2009    . Wears dentures    top plate  . WPW (Wolff-Parkinson-White syndrome)    a. s/p RFCA @ [place]    Problem List: Patient Active Problem List   Diagnosis Date Noted  . Diarrhea 06/27/2019  . Diastasis recti 08/22/2018  . Umbilical hernia without obstruction and without gangrene 08/22/2018  . Vitamin D deficiency 06/23/2017  . Essential hypertension, benign 06/15/2017  .  Mixed hyperlipidemia 06/15/2017  . Class 1 obesity due to excess calories with serious comorbidity and body mass index (BMI) of 31.0 to 31.9 in adult 06/15/2017  . Malfunction of implantable cardioverter-defibrillator (ICD) electrode 10/07/2016  . Chronic kidney disease (CKD), stage III (moderate) 04/04/2015  . Bladder neck obstruction 03/21/2015  . Difficult or painful urination 03/21/2015  . Flank pain 03/21/2015  . Delayed onset of urination 03/21/2015  . Calculus of kidney 03/21/2015  . Erosive esophagitis 12/27/2013  . Hypotension due to drugs 07/02/2013  . Chronic systolic heart failure ([place]) 04/24/2013  . CAP (community acquired pneumonia)  03/16/2013  . HTN (hypertension) 03/15/2013  . Hydrocele 03/15/2013  . Spermatocele 03/15/2013  . DOE (dyspnea on exertion) 03/15/2013  . OSA (obstructive sleep apnea) 01/09/2013  . Chronic kidney disease, stage 3 01/03/2013  . Chest pain 12/26/2012  . History of diagnostic tests 09/06/2012  . TIA (transient ischemic attack)   . Cardiomyopathy, nonischemic ([place]) 02/24/2010  . AICD (automatic cardioverter/defibrillator) 02/24/2010  . DM type 2 causing vascular disease ([place]) 06/27/2009  . Gout 06/27/2009  . Tobacco abuse, in remission 06/27/2009  . COLONIC POLYPS 10/31/2008  . GERD (gastroesophageal reflux disease) 10/31/2008    Past Surgical History: Past Surgical History:  Procedure Laterality Date  . APPENDECTOMY    . BACK SURGERY     1995  . CARDIAC CATHETERIZATION     2009  . CARDIAC DEFIBRILLATOR PLACEMENT    . CHOLECYSTECTOMY    . CHOLECYSTECTOMY, LAPAROSCOPIC     11/2007  . COLONOSCOPY W/ POLYPECTOMY  2009  . ELBOW SURGERY Left 06/2010  . ESOPHAGEAL DILATION N/A 11/08/2014   Procedure: ESOPHAGEAL DILATION;  Surgeon: Rogene Houston, MD;  Location: [facility];  Service: Endoscopy;  Laterality: N/A;  #[id number],   . ESOPHAGOGASTRODUODENOSCOPY  03/31/2012   also 08/2011; [name]  . ESOPHAGOGASTRODUODENOSCOPY (EGD) WITH PROPOFOL N/A 11/08/2014   Procedure: ESOPHAGOGASTRODUODENOSCOPY (EGD) WITH PROPOFOL;  Surgeon: Rogene Houston, MD;  Location: [facility];  Service: Endoscopy;  Laterality: N/A;  [place] is 39 , [place] is 37  . ICD LEAD REMOVAL N/A 10/07/2016   Procedure: ICD LEAD REMOVAL ;  Surgeon: Evans Lance, MD;  Location: [place];  Service: Cardiovascular;  Laterality: N/A;  . MULTIPLE TOOTH EXTRACTIONS    . [place]; performed at [place] N/A 10/07/2016   Procedure: TRANSESOPHAGEAL ECHOCARDIOGRAM (TEE);  Surgeon: Evans Lance, MD;  Location: [facility] OR;  Service: Cardiovascular;  Laterality: N/A;    Allergies: No Known Allergies     Home Medications:  Current Outpatient Medications:  .  acarbose (PRECOSE) 50 MG tablet, Take 100 mg by mouth 3 (three) times daily with meals. , Disp: , Rfl:  .  amitriptyline (ELAVIL) 50 MG tablet, Take 50 mg by mouth at bedtime. , Disp: , Rfl:  .  aspirin 81 MG tablet, Take 81 mg by mouth daily as needed for pain., Disp: , Rfl:  .  atorvastatin (LIPITOR) 20 MG tablet, Take 20 mg by mouth daily., Disp: , Rfl:  .  B-D ULTRAFINE III SHORT PEN 31G X 8 MM MISC, USE WITH TRESBIA., Disp: 100 each, Rfl: 0 .  CALCIUM PO, Take 1 tablet by mouth daily., Disp: , Rfl:  .  carvedilol (COREG) 12.5 MG tablet, TAKE 1 TABLET BY MOUTH TWICE DAILY., Disp: 180 tablet, Rfl: 2 .  Cholecalciferol (VITAMIN D3) 5000 units CAPS, Take 1 capsule (5,000 Units total) by mouth daily.,  Disp: 90 capsule, Rfl: 0 .  famotidine (PEPCID) 20 MG tablet, TAKE (1) TABLET BY MOUTH AT BEDTIME., Disp: 30 tablet, Rfl: 0 .  FARXIGA 10 MG TABS tablet, Take 10 mg by mouth every morning., Disp: , Rfl:  .  furosemide (LASIX) 40 MG tablet, TAKE (1) TABLET BY MOUTH ONCE DAILY., Disp: 30 tablet, Rfl: 6 .  gabapentin (NEURONTIN) 300 MG capsule, Take 300 mg by mouth 3 (three) times daily. , Disp: , Rfl:  .  glimepiride (AMARYL) 4 MG tablet, Take 4 mg by mouth 2 (two) times daily. , Disp: , Rfl:  .  insulin degludec (TRESIBA FLEXTOUCH) 100 UNIT/ML SOPN FlexTouch Pen, Inject 0.3 mLs (30 Units total) into the skin at bedtime. (Patient taking differently: Inject 52 Units into the skin at bedtime. ), Disp: 15 mL, Rfl: 2 .  isosorbide mononitrate (IMDUR) 30 MG 24 hr tablet, TAKE (1) TABLET BY MOUTH ONCE DAILY., Disp: 30 tablet, Rfl: 0 .  L-Methylfolate-B6-B12 (FOLTANX) 3-35-2 MG TABS, Take 1 tablet by mouth daily. , Disp: , Rfl:  .  nitroGLYCERIN (NITROSTAT) 0.4 MG SL tablet, Place 1 tablet (0.4 mg total) under the tongue every 5 (five) minutes as needed for chest pain., Disp: 25 tablet, Rfl: 3 .  omeprazole (PRILOSEC) 40 MG capsule, TAKE (1)  CAPSULE BY MOUTH ONCE DAILY., Disp: 30 capsule, Rfl: 6 .  ONETOUCH VERIO test strip, TESTING 4 TIMES DAILY., Disp: 150 each, Rfl: 5 .  oxycodone (ROXICODONE) 30 MG immediate release tablet, Take 1-2 tablets by mouth 4 (four) times daily as needed for pain. , Disp: , Rfl:  .  potassium chloride SA (KLOR-CON) 20 MEQ tablet, TAKE (1) TABLET BY MOUTH ONCE DAILY., Disp: 90 tablet, Rfl: 2 .  sucralfate (CARAFATE) 1 g tablet, TAKE 1 TABLET BY MOUTH 30 MINTUES BEFORE MEALS AND AT BEDTIME., Disp: 120 tablet, Rfl: 2 .  tamsulosin (FLOMAX) 0.4 MG CAPS capsule, Take 0.4 mg daily by mouth., Disp: , Rfl:  .  LORazepam (ATIVAN) 1 MG tablet, Take 1 mg by mouth 3 (three) times daily. , Disp: , Rfl:    Family History: family history includes Breast cancer in his mother; Diabetes in his brother, father, and mother; Heart attack (age of onset: 45) in his brother and father; Heart attack (age of onset: 70) in his mother; Hypertension in his brother, father, and mother; Kidney disease in his mother; Stroke in his brother; Stroke (age of onset: 67) in his maternal grandmother.     Social History:   reports that he quit smoking about 9 years ago. His smoking use included cigarettes. He started smoking about 48 years ago. He has a 52.50 pack-year smoking history. He quit smokeless tobacco use about 25 years ago.  His smokeless tobacco use included chew. He reports that he does not drink alcohol or use drugs.   Review of Systems: Constitutional: Denies weight loss/weight gain  Eyes: No changes in vision. ENT: No oral lesions, sore throat.  GI: see HPI.  Heme/Lymph: No easy bruising.  CV: No chest pain.  GU: No hematuria.  Integumentary: No rashes.  Neuro: No headaches.  Psych: No depression/anxiety.  Endocrine: No heat/cold intolerance.  Allergic/Immunologic: No urticaria.  Resp: No cough, SOB.  Musculoskeletal: No joint swelling.    Physical Examination: BP 120/83 (BP Location: Right Arm, Patient Position:  Sitting, Cuff Size: Large)   Pulse 94   Temp (!) 97.4 F (36.3 C) (Temporal)   Ht '6\' 1"'$  (1.854 m)   Wt 232 lb (  105.2 kg)   BMI 30.61 kg/m  Gen: NAD, alert and oriented x 4 HEENT: PEERLA, EOMI, Neck: supple, no JVD Chest: CTA bilaterally, no wheezes, crackles, or other adventitious sounds CV: RRR, no m/g/c/r Abd: soft, NT, ND, +BS in all four quadrants; no HSM, guarding, ridigity, or rebound tenderness Ext: no edema, well perfused with 2+ pulses, Skin: no rash or lesions noted on observed skin Lymph: no noted LAD  Data Reviewed:  Labs November 2020-256, creatinine 1.37, GFR 56, ALT 16, platelet 137, CRP 2.2.  GI pathology panel was C. difficile detected.  He was treated with 14-day course of vancomycin.  Repeat LFTs January 2021 with alk phos 176, AST 39, ALT 74, total bili 1.3, direct bili 0.4  [place] 07/13/19-IMPRESSION: Increased hepatic echogenicity suggesting steatosis.  Barium swallow - 06/2019-IMPRESSION: Gastroesophageal reflux to the level of the aortic arch. Minimal laryngeal penetration without aspiration. No evidence of esophageal mass or stricture.   Assessment/Plan: Mr. Appleby is a 59 y.o. male    Mordecai was seen today for follow-up.  Diagnoses and all orders for this visit:  Elevated LFTs -     Hepatic function panel -     Hepatitis C Antibody -     Fe+TIBC+Fer -     ANA -     Mitochondrial/smooth muscle ab pnl -     Hepatitis B Surface AntiGEN -     Hepatitis B Surface AntiBODY -     Hepatitis B Core Antibody, total  Type 2 diabetes mellitus without complication, with long-term current use of insulin (HCC) -     HgB A1c  Chronic GERD  Dysphagia, unspecified type  Other orders -     Mitochondrial ab rflx titr -     SMOOTH MUSC W/REFL -     ANA     Recommendations:  1.  Elevated LFTs-suspect fatty liver disease/[place]-these have been elevated on repeat labs, he does notice blood sugars have been poorly controlled recently. Ultrasound showed  steatosis.  We will get serologic work-up to exclude other etiologies but suspect to be normal.  Diet weight loss, better diabetic control, etc. Recommended.  2.  Dysphagia-barium swallow as above.  Rare symptoms to bread.  He declines upper endoscopy.  He will contact us if feels symptoms worsen.  Due for colon cancer screening but also declines this today.  3.  GERD-well-controlled on Prilosec.  Encouraged him to decrease his 12 cups of coffee a day.  He does not take NSAIDs.  He will call and let me know if he is taking the Carafate or Pepcid on his medicine list.    I personally performed the service, non-incident to. (WP)  Laurine Blazer, [facility]

## 2019-08-23 NOTE — Patient Instructions (Addendum)
Please call and let me know if you are taking Carafate/sucralfate and Pepcid   We are checking labs for evaluation - we will call you with results

## 2019-08-27 LAB — HEPATIC FUNCTION PANEL
AG Ratio: 2.4 (calc) (ref 1.0–2.5)
ALT: 51 U/L — ABNORMAL HIGH (ref 9–46)
AST: 29 U/L (ref 10–35)
Albumin: 4.8 g/dL (ref 3.6–5.1)
Alkaline phosphatase (APISO): 163 U/L — ABNORMAL HIGH (ref 35–144)
Bilirubin, Direct: 0.4 mg/dL — ABNORMAL HIGH (ref 0.0–0.2)
Globulin: 2 g/dL (calc) (ref 1.9–3.7)
Indirect Bilirubin: 1.1 mg/dL (calc) (ref 0.2–1.2)
Total Bilirubin: 1.5 mg/dL — ABNORMAL HIGH (ref 0.2–1.2)
Total Protein: 6.8 g/dL (ref 6.1–8.1)

## 2019-08-27 LAB — ANA: Anti Nuclear Antibody (ANA): NEGATIVE

## 2019-08-27 LAB — IRON,TIBC AND FERRITIN PANEL
%SAT: 32 % (calc) (ref 20–48)
Ferritin: 238 ng/mL (ref 24–380)
Iron: 98 ug/dL (ref 50–180)
TIBC: 307 mcg/dL (calc) (ref 250–425)

## 2019-08-27 LAB — HEPATITIS C ANTIBODY
Hepatitis C Ab: NONREACTIVE
SIGNAL TO CUT-OFF: 0.01 (ref <1.00)

## 2019-08-27 LAB — HEPATITIS B SURFACE ANTIGEN: Hepatitis B Surface Ag: NONREACTIVE

## 2019-08-27 LAB — MITOCHONDRIAL AB RFLX TITR: Mitochondrial Antibody Screen: NEGATIVE

## 2019-08-27 LAB — HEPATITIS B CORE ANTIBODY, TOTAL: Hep B Core Total Ab: NONREACTIVE

## 2019-08-27 LAB — HEPATITIS B SURFACE ANTIBODY,QUALITATIVE: Hep B S Ab: REACTIVE — AB

## 2019-08-27 LAB — SMOOTH MUSC W/REFL: SMOOTH MUSCLE AB SCREEN: POSITIVE — AB

## 2019-08-27 LAB — SMOOTH MUSCLE TITER,RFLX: SMOOTH MUSCLE AB TITER: 1:40 {titer} — ABNORMAL HIGH

## 2019-08-29 DIAGNOSIS — E1151 Type 2 diabetes mellitus with diabetic peripheral angiopathy without gangrene: Secondary | ICD-10-CM | POA: Diagnosis not present

## 2019-08-29 DIAGNOSIS — L89893 Pressure ulcer of other site, stage 3: Secondary | ICD-10-CM | POA: Diagnosis not present

## 2019-08-29 DIAGNOSIS — M79671 Pain in right foot: Secondary | ICD-10-CM | POA: Diagnosis not present

## 2019-08-29 DIAGNOSIS — E1140 Type 2 diabetes mellitus with diabetic neuropathy, unspecified: Secondary | ICD-10-CM | POA: Diagnosis not present

## 2019-08-29 DIAGNOSIS — L03031 Cellulitis of right toe: Secondary | ICD-10-CM | POA: Diagnosis not present

## 2019-09-07 ENCOUNTER — Other Ambulatory Visit (INDEPENDENT_AMBULATORY_CARE_PROVIDER_SITE_OTHER): Payer: Self-pay | Admitting: Nurse Practitioner

## 2019-09-07 DIAGNOSIS — R1319 Other dysphagia: Secondary | ICD-10-CM

## 2019-09-07 DIAGNOSIS — R1310 Dysphagia, unspecified: Secondary | ICD-10-CM

## 2019-09-07 DIAGNOSIS — K219 Gastro-esophageal reflux disease without esophagitis: Secondary | ICD-10-CM

## 2019-09-08 NOTE — Telephone Encounter (Signed)
Thayer Headings, pls manage rx request thx

## 2019-09-09 DIAGNOSIS — N1831 Chronic kidney disease, stage 3a: Secondary | ICD-10-CM | POA: Diagnosis not present

## 2019-09-09 DIAGNOSIS — E1122 Type 2 diabetes mellitus with diabetic chronic kidney disease: Secondary | ICD-10-CM | POA: Diagnosis not present

## 2019-09-09 DIAGNOSIS — I129 Hypertensive chronic kidney disease with stage 1 through stage 4 chronic kidney disease, or unspecified chronic kidney disease: Secondary | ICD-10-CM | POA: Diagnosis not present

## 2019-09-12 DIAGNOSIS — E1140 Type 2 diabetes mellitus with diabetic neuropathy, unspecified: Secondary | ICD-10-CM | POA: Diagnosis not present

## 2019-09-12 DIAGNOSIS — L89893 Pressure ulcer of other site, stage 3: Secondary | ICD-10-CM | POA: Diagnosis not present

## 2019-09-18 DIAGNOSIS — E1140 Type 2 diabetes mellitus with diabetic neuropathy, unspecified: Secondary | ICD-10-CM | POA: Diagnosis not present

## 2019-09-18 DIAGNOSIS — E1151 Type 2 diabetes mellitus with diabetic peripheral angiopathy without gangrene: Secondary | ICD-10-CM | POA: Diagnosis not present

## 2019-09-18 DIAGNOSIS — L89892 Pressure ulcer of other site, stage 2: Secondary | ICD-10-CM | POA: Diagnosis not present

## 2019-10-02 DIAGNOSIS — L03031 Cellulitis of right toe: Secondary | ICD-10-CM | POA: Diagnosis not present

## 2019-10-02 DIAGNOSIS — L600 Ingrowing nail: Secondary | ICD-10-CM | POA: Diagnosis not present

## 2019-10-02 DIAGNOSIS — E1140 Type 2 diabetes mellitus with diabetic neuropathy, unspecified: Secondary | ICD-10-CM | POA: Diagnosis not present

## 2019-10-02 DIAGNOSIS — E1151 Type 2 diabetes mellitus with diabetic peripheral angiopathy without gangrene: Secondary | ICD-10-CM | POA: Diagnosis not present

## 2019-10-02 DIAGNOSIS — L03032 Cellulitis of left toe: Secondary | ICD-10-CM | POA: Diagnosis not present

## 2019-10-03 ENCOUNTER — Other Ambulatory Visit (INDEPENDENT_AMBULATORY_CARE_PROVIDER_SITE_OTHER): Payer: Self-pay | Admitting: Internal Medicine

## 2019-10-03 DIAGNOSIS — K219 Gastro-esophageal reflux disease without esophagitis: Secondary | ICD-10-CM

## 2019-10-03 DIAGNOSIS — R1319 Other dysphagia: Secondary | ICD-10-CM

## 2019-10-03 DIAGNOSIS — R1310 Dysphagia, unspecified: Secondary | ICD-10-CM

## 2019-10-07 DIAGNOSIS — N1831 Chronic kidney disease, stage 3a: Secondary | ICD-10-CM | POA: Diagnosis not present

## 2019-10-07 DIAGNOSIS — I129 Hypertensive chronic kidney disease with stage 1 through stage 4 chronic kidney disease, or unspecified chronic kidney disease: Secondary | ICD-10-CM | POA: Diagnosis not present

## 2019-10-07 DIAGNOSIS — E1122 Type 2 diabetes mellitus with diabetic chronic kidney disease: Secondary | ICD-10-CM | POA: Diagnosis not present

## 2019-10-16 DIAGNOSIS — E1140 Type 2 diabetes mellitus with diabetic neuropathy, unspecified: Secondary | ICD-10-CM | POA: Diagnosis not present

## 2019-10-16 DIAGNOSIS — L89892 Pressure ulcer of other site, stage 2: Secondary | ICD-10-CM | POA: Diagnosis not present

## 2019-10-30 ENCOUNTER — Other Ambulatory Visit: Payer: Self-pay | Admitting: Cardiovascular Disease

## 2019-10-30 DIAGNOSIS — I10 Essential (primary) hypertension: Secondary | ICD-10-CM | POA: Diagnosis not present

## 2019-10-30 DIAGNOSIS — G894 Chronic pain syndrome: Secondary | ICD-10-CM | POA: Diagnosis not present

## 2019-10-30 DIAGNOSIS — M1991 Primary osteoarthritis, unspecified site: Secondary | ICD-10-CM | POA: Diagnosis not present

## 2019-10-30 DIAGNOSIS — E1140 Type 2 diabetes mellitus with diabetic neuropathy, unspecified: Secondary | ICD-10-CM | POA: Diagnosis not present

## 2019-11-06 ENCOUNTER — Other Ambulatory Visit (INDEPENDENT_AMBULATORY_CARE_PROVIDER_SITE_OTHER): Payer: Self-pay | Admitting: Gastroenterology

## 2019-11-06 DIAGNOSIS — R1319 Other dysphagia: Secondary | ICD-10-CM

## 2019-11-06 DIAGNOSIS — M79671 Pain in right foot: Secondary | ICD-10-CM | POA: Diagnosis not present

## 2019-11-06 DIAGNOSIS — M79672 Pain in left foot: Secondary | ICD-10-CM | POA: Diagnosis not present

## 2019-11-06 DIAGNOSIS — K219 Gastro-esophageal reflux disease without esophagitis: Secondary | ICD-10-CM

## 2019-11-06 DIAGNOSIS — R1310 Dysphagia, unspecified: Secondary | ICD-10-CM

## 2019-11-06 DIAGNOSIS — L89892 Pressure ulcer of other site, stage 2: Secondary | ICD-10-CM | POA: Diagnosis not present

## 2019-11-06 DIAGNOSIS — M79674 Pain in right toe(s): Secondary | ICD-10-CM | POA: Diagnosis not present

## 2019-11-06 DIAGNOSIS — E1140 Type 2 diabetes mellitus with diabetic neuropathy, unspecified: Secondary | ICD-10-CM | POA: Diagnosis not present

## 2019-11-07 DIAGNOSIS — I129 Hypertensive chronic kidney disease with stage 1 through stage 4 chronic kidney disease, or unspecified chronic kidney disease: Secondary | ICD-10-CM | POA: Diagnosis not present

## 2019-11-07 DIAGNOSIS — N1831 Chronic kidney disease, stage 3a: Secondary | ICD-10-CM | POA: Diagnosis not present

## 2019-11-07 DIAGNOSIS — E1122 Type 2 diabetes mellitus with diabetic chronic kidney disease: Secondary | ICD-10-CM | POA: Diagnosis not present

## 2019-11-27 DIAGNOSIS — E1140 Type 2 diabetes mellitus with diabetic neuropathy, unspecified: Secondary | ICD-10-CM | POA: Diagnosis not present

## 2019-11-27 DIAGNOSIS — E1151 Type 2 diabetes mellitus with diabetic peripheral angiopathy without gangrene: Secondary | ICD-10-CM | POA: Diagnosis not present

## 2019-11-27 DIAGNOSIS — L89892 Pressure ulcer of other site, stage 2: Secondary | ICD-10-CM | POA: Diagnosis not present

## 2019-11-27 DIAGNOSIS — M79672 Pain in left foot: Secondary | ICD-10-CM | POA: Diagnosis not present

## 2019-11-27 DIAGNOSIS — M79671 Pain in right foot: Secondary | ICD-10-CM | POA: Diagnosis not present

## 2019-12-03 ENCOUNTER — Other Ambulatory Visit (INDEPENDENT_AMBULATORY_CARE_PROVIDER_SITE_OTHER): Payer: Self-pay | Admitting: Internal Medicine

## 2019-12-03 DIAGNOSIS — R1310 Dysphagia, unspecified: Secondary | ICD-10-CM

## 2019-12-03 DIAGNOSIS — K219 Gastro-esophageal reflux disease without esophagitis: Secondary | ICD-10-CM

## 2019-12-03 DIAGNOSIS — R1319 Other dysphagia: Secondary | ICD-10-CM

## 2019-12-07 DIAGNOSIS — N1831 Chronic kidney disease, stage 3a: Secondary | ICD-10-CM | POA: Diagnosis not present

## 2019-12-07 DIAGNOSIS — E1122 Type 2 diabetes mellitus with diabetic chronic kidney disease: Secondary | ICD-10-CM | POA: Diagnosis not present

## 2019-12-07 DIAGNOSIS — I129 Hypertensive chronic kidney disease with stage 1 through stage 4 chronic kidney disease, or unspecified chronic kidney disease: Secondary | ICD-10-CM | POA: Diagnosis not present

## 2019-12-11 DIAGNOSIS — M2042 Other hammer toe(s) (acquired), left foot: Secondary | ICD-10-CM | POA: Diagnosis not present

## 2019-12-11 DIAGNOSIS — M2032 Hallux varus (acquired), left foot: Secondary | ICD-10-CM | POA: Diagnosis not present

## 2019-12-11 DIAGNOSIS — L97529 Non-pressure chronic ulcer of other part of left foot with unspecified severity: Secondary | ICD-10-CM | POA: Diagnosis not present

## 2019-12-11 DIAGNOSIS — M2041 Other hammer toe(s) (acquired), right foot: Secondary | ICD-10-CM | POA: Diagnosis not present

## 2019-12-18 DIAGNOSIS — L89892 Pressure ulcer of other site, stage 2: Secondary | ICD-10-CM | POA: Diagnosis not present

## 2019-12-18 DIAGNOSIS — E1140 Type 2 diabetes mellitus with diabetic neuropathy, unspecified: Secondary | ICD-10-CM | POA: Diagnosis not present

## 2019-12-18 DIAGNOSIS — E1151 Type 2 diabetes mellitus with diabetic peripheral angiopathy without gangrene: Secondary | ICD-10-CM | POA: Diagnosis not present

## 2019-12-24 DIAGNOSIS — G894 Chronic pain syndrome: Secondary | ICD-10-CM | POA: Diagnosis not present

## 2019-12-24 DIAGNOSIS — E1129 Type 2 diabetes mellitus with other diabetic kidney complication: Secondary | ICD-10-CM | POA: Diagnosis not present

## 2020-01-01 ENCOUNTER — Other Ambulatory Visit (INDEPENDENT_AMBULATORY_CARE_PROVIDER_SITE_OTHER): Payer: Self-pay | Admitting: Nurse Practitioner

## 2020-01-01 DIAGNOSIS — L97522 Non-pressure chronic ulcer of other part of left foot with fat layer exposed: Secondary | ICD-10-CM | POA: Diagnosis not present

## 2020-01-01 DIAGNOSIS — M2032 Hallux varus (acquired), left foot: Secondary | ICD-10-CM | POA: Diagnosis not present

## 2020-01-01 DIAGNOSIS — R1319 Other dysphagia: Secondary | ICD-10-CM

## 2020-01-01 DIAGNOSIS — K219 Gastro-esophageal reflux disease without esophagitis: Secondary | ICD-10-CM

## 2020-01-01 DIAGNOSIS — M2041 Other hammer toe(s) (acquired), right foot: Secondary | ICD-10-CM | POA: Diagnosis not present

## 2020-01-01 DIAGNOSIS — M2042 Other hammer toe(s) (acquired), left foot: Secondary | ICD-10-CM | POA: Diagnosis not present

## 2020-01-01 NOTE — Telephone Encounter (Signed)
Hi [name], can you have Dr. Laural Golden or Thayer Headings review RX request thx

## 2020-01-02 MED ORDER — OMEPRAZOLE 40 MG PO CPDR: DELAYED_RELEASE_CAPSULE | ORAL | 6 refills | Status: DC

## 2020-01-02 NOTE — Addendum Note (Signed)
Addended by: Laurine Blazer A on: 01/02/2020 09:57 AM   Modules accepted: Orders

## 2020-01-07 DIAGNOSIS — I129 Hypertensive chronic kidney disease with stage 1 through stage 4 chronic kidney disease, or unspecified chronic kidney disease: Secondary | ICD-10-CM | POA: Diagnosis not present

## 2020-01-07 DIAGNOSIS — E1122 Type 2 diabetes mellitus with diabetic chronic kidney disease: Secondary | ICD-10-CM | POA: Diagnosis not present

## 2020-01-07 DIAGNOSIS — N1831 Chronic kidney disease, stage 3a: Secondary | ICD-10-CM | POA: Diagnosis not present

## 2020-01-23 DIAGNOSIS — L84 Corns and callosities: Secondary | ICD-10-CM | POA: Diagnosis not present

## 2020-01-23 DIAGNOSIS — M1991 Primary osteoarthritis, unspecified site: Secondary | ICD-10-CM | POA: Diagnosis not present

## 2020-01-23 DIAGNOSIS — G894 Chronic pain syndrome: Secondary | ICD-10-CM | POA: Diagnosis not present

## 2020-01-28 ENCOUNTER — Other Ambulatory Visit: Payer: Self-pay | Admitting: Cardiovascular Disease

## 2020-02-06 DIAGNOSIS — I129 Hypertensive chronic kidney disease with stage 1 through stage 4 chronic kidney disease, or unspecified chronic kidney disease: Secondary | ICD-10-CM | POA: Diagnosis not present

## 2020-02-06 DIAGNOSIS — E1122 Type 2 diabetes mellitus with diabetic chronic kidney disease: Secondary | ICD-10-CM | POA: Diagnosis not present

## 2020-02-06 DIAGNOSIS — N1831 Chronic kidney disease, stage 3a: Secondary | ICD-10-CM | POA: Diagnosis not present

## 2020-02-11 DIAGNOSIS — L110 Acquired keratosis follicularis: Secondary | ICD-10-CM | POA: Diagnosis not present

## 2020-02-11 DIAGNOSIS — E1140 Type 2 diabetes mellitus with diabetic neuropathy, unspecified: Secondary | ICD-10-CM | POA: Diagnosis not present

## 2020-02-11 DIAGNOSIS — M79671 Pain in right foot: Secondary | ICD-10-CM | POA: Diagnosis not present

## 2020-02-11 DIAGNOSIS — E1151 Type 2 diabetes mellitus with diabetic peripheral angiopathy without gangrene: Secondary | ICD-10-CM | POA: Diagnosis not present

## 2020-02-11 DIAGNOSIS — L89892 Pressure ulcer of other site, stage 2: Secondary | ICD-10-CM | POA: Diagnosis not present

## 2020-03-03 ENCOUNTER — Other Ambulatory Visit (INDEPENDENT_AMBULATORY_CARE_PROVIDER_SITE_OTHER): Payer: Self-pay | Admitting: Gastroenterology

## 2020-03-03 DIAGNOSIS — M1991 Primary osteoarthritis, unspecified site: Secondary | ICD-10-CM | POA: Diagnosis not present

## 2020-03-03 DIAGNOSIS — J309 Allergic rhinitis, unspecified: Secondary | ICD-10-CM | POA: Diagnosis not present

## 2020-03-03 DIAGNOSIS — R1319 Other dysphagia: Secondary | ICD-10-CM

## 2020-03-03 DIAGNOSIS — M2550 Pain in unspecified joint: Secondary | ICD-10-CM | POA: Diagnosis not present

## 2020-03-03 DIAGNOSIS — K219 Gastro-esophageal reflux disease without esophagitis: Secondary | ICD-10-CM

## 2020-03-03 DIAGNOSIS — G894 Chronic pain syndrome: Secondary | ICD-10-CM | POA: Diagnosis not present

## 2020-03-07 DIAGNOSIS — I129 Hypertensive chronic kidney disease with stage 1 through stage 4 chronic kidney disease, or unspecified chronic kidney disease: Secondary | ICD-10-CM | POA: Diagnosis not present

## 2020-03-07 DIAGNOSIS — N1831 Chronic kidney disease, stage 3a: Secondary | ICD-10-CM | POA: Diagnosis not present

## 2020-03-07 DIAGNOSIS — E1122 Type 2 diabetes mellitus with diabetic chronic kidney disease: Secondary | ICD-10-CM | POA: Diagnosis not present

## 2020-03-19 DIAGNOSIS — L97512 Non-pressure chronic ulcer of other part of right foot with fat layer exposed: Secondary | ICD-10-CM | POA: Diagnosis not present

## 2020-03-19 DIAGNOSIS — M2032 Hallux varus (acquired), left foot: Secondary | ICD-10-CM | POA: Diagnosis not present

## 2020-03-19 DIAGNOSIS — M2041 Other hammer toe(s) (acquired), right foot: Secondary | ICD-10-CM | POA: Diagnosis not present

## 2020-03-19 DIAGNOSIS — M2042 Other hammer toe(s) (acquired), left foot: Secondary | ICD-10-CM | POA: Diagnosis not present

## 2020-03-19 DIAGNOSIS — L97522 Non-pressure chronic ulcer of other part of left foot with fat layer exposed: Secondary | ICD-10-CM | POA: Diagnosis not present

## 2020-03-31 DIAGNOSIS — M1991 Primary osteoarthritis, unspecified site: Secondary | ICD-10-CM | POA: Diagnosis not present

## 2020-03-31 DIAGNOSIS — E7849 Other hyperlipidemia: Secondary | ICD-10-CM | POA: Diagnosis not present

## 2020-03-31 DIAGNOSIS — G894 Chronic pain syndrome: Secondary | ICD-10-CM | POA: Diagnosis not present

## 2020-03-31 DIAGNOSIS — E039 Hypothyroidism, unspecified: Secondary | ICD-10-CM | POA: Diagnosis not present

## 2020-03-31 DIAGNOSIS — E1129 Type 2 diabetes mellitus with other diabetic kidney complication: Secondary | ICD-10-CM | POA: Diagnosis not present

## 2020-04-02 DIAGNOSIS — E1129 Type 2 diabetes mellitus with other diabetic kidney complication: Secondary | ICD-10-CM | POA: Diagnosis not present

## 2020-04-03 ENCOUNTER — Other Ambulatory Visit (INDEPENDENT_AMBULATORY_CARE_PROVIDER_SITE_OTHER): Payer: Self-pay | Admitting: Gastroenterology

## 2020-04-03 DIAGNOSIS — R1319 Other dysphagia: Secondary | ICD-10-CM

## 2020-04-03 DIAGNOSIS — K219 Gastro-esophageal reflux disease without esophagitis: Secondary | ICD-10-CM

## 2020-04-08 DIAGNOSIS — N1831 Chronic kidney disease, stage 3a: Secondary | ICD-10-CM | POA: Diagnosis not present

## 2020-04-08 DIAGNOSIS — I129 Hypertensive chronic kidney disease with stage 1 through stage 4 chronic kidney disease, or unspecified chronic kidney disease: Secondary | ICD-10-CM | POA: Diagnosis not present

## 2020-04-08 DIAGNOSIS — E1122 Type 2 diabetes mellitus with diabetic chronic kidney disease: Secondary | ICD-10-CM | POA: Diagnosis not present

## 2020-04-28 ENCOUNTER — Other Ambulatory Visit: Payer: Self-pay | Admitting: "Endocrinology

## 2020-05-01 ENCOUNTER — Other Ambulatory Visit (INDEPENDENT_AMBULATORY_CARE_PROVIDER_SITE_OTHER): Payer: Self-pay | Admitting: Gastroenterology

## 2020-05-01 DIAGNOSIS — R1319 Other dysphagia: Secondary | ICD-10-CM

## 2020-05-01 DIAGNOSIS — K219 Gastro-esophageal reflux disease without esophagitis: Secondary | ICD-10-CM

## 2020-05-02 ENCOUNTER — Other Ambulatory Visit: Payer: Self-pay

## 2020-05-02 MED ORDER — CARVEDILOL 12.5 MG PO TABS: 12.5000 mg | ORAL_TABLET | Freq: Two times a day (BID) | ORAL | 2 refills | Status: DC

## 2020-05-02 NOTE — Telephone Encounter (Signed)
Refilled coreg.

## 2020-05-05 ENCOUNTER — Other Ambulatory Visit: Payer: Self-pay | Admitting: *Deleted

## 2020-05-05 DIAGNOSIS — G894 Chronic pain syndrome: Secondary | ICD-10-CM | POA: Diagnosis not present

## 2020-05-05 DIAGNOSIS — M1991 Primary osteoarthritis, unspecified site: Secondary | ICD-10-CM | POA: Diagnosis not present

## 2020-05-05 DIAGNOSIS — E118 Type 2 diabetes mellitus with unspecified complications: Secondary | ICD-10-CM | POA: Diagnosis not present

## 2020-05-05 DIAGNOSIS — I10 Essential (primary) hypertension: Secondary | ICD-10-CM | POA: Diagnosis not present

## 2020-05-05 MED ORDER — POTASSIUM CHLORIDE CRYS ER 20 MEQ PO TBCR: 20.0000 meq | EXTENDED_RELEASE_TABLET | Freq: Every day | ORAL | 0 refills | Status: DC

## 2020-05-08 DIAGNOSIS — E1122 Type 2 diabetes mellitus with diabetic chronic kidney disease: Secondary | ICD-10-CM | POA: Diagnosis not present

## 2020-05-08 DIAGNOSIS — N1831 Chronic kidney disease, stage 3a: Secondary | ICD-10-CM | POA: Diagnosis not present

## 2020-05-08 DIAGNOSIS — I129 Hypertensive chronic kidney disease with stage 1 through stage 4 chronic kidney disease, or unspecified chronic kidney disease: Secondary | ICD-10-CM | POA: Diagnosis not present

## 2020-06-03 ENCOUNTER — Other Ambulatory Visit (INDEPENDENT_AMBULATORY_CARE_PROVIDER_SITE_OTHER): Payer: Self-pay | Admitting: Gastroenterology

## 2020-06-03 DIAGNOSIS — R1319 Other dysphagia: Secondary | ICD-10-CM

## 2020-06-03 DIAGNOSIS — K219 Gastro-esophageal reflux disease without esophagitis: Secondary | ICD-10-CM

## 2020-06-06 DIAGNOSIS — E1165 Type 2 diabetes mellitus with hyperglycemia: Secondary | ICD-10-CM | POA: Diagnosis not present

## 2020-06-06 DIAGNOSIS — I428 Other cardiomyopathies: Secondary | ICD-10-CM | POA: Diagnosis not present

## 2020-06-06 DIAGNOSIS — G894 Chronic pain syndrome: Secondary | ICD-10-CM | POA: Diagnosis not present

## 2020-06-06 DIAGNOSIS — G4700 Insomnia, unspecified: Secondary | ICD-10-CM | POA: Diagnosis not present

## 2020-06-06 DIAGNOSIS — E1129 Type 2 diabetes mellitus with other diabetic kidney complication: Secondary | ICD-10-CM | POA: Diagnosis not present

## 2020-06-07 DIAGNOSIS — N1831 Chronic kidney disease, stage 3a: Secondary | ICD-10-CM | POA: Diagnosis not present

## 2020-06-07 DIAGNOSIS — E1122 Type 2 diabetes mellitus with diabetic chronic kidney disease: Secondary | ICD-10-CM | POA: Diagnosis not present

## 2020-06-07 DIAGNOSIS — I129 Hypertensive chronic kidney disease with stage 1 through stage 4 chronic kidney disease, or unspecified chronic kidney disease: Secondary | ICD-10-CM | POA: Diagnosis not present

## 2020-07-08 DIAGNOSIS — I10 Essential (primary) hypertension: Secondary | ICD-10-CM | POA: Diagnosis not present

## 2020-07-08 DIAGNOSIS — E1140 Type 2 diabetes mellitus with diabetic neuropathy, unspecified: Secondary | ICD-10-CM | POA: Diagnosis not present

## 2020-07-08 DIAGNOSIS — E1122 Type 2 diabetes mellitus with diabetic chronic kidney disease: Secondary | ICD-10-CM | POA: Diagnosis not present

## 2020-07-08 DIAGNOSIS — N1831 Chronic kidney disease, stage 3a: Secondary | ICD-10-CM | POA: Diagnosis not present

## 2020-07-08 DIAGNOSIS — I129 Hypertensive chronic kidney disease with stage 1 through stage 4 chronic kidney disease, or unspecified chronic kidney disease: Secondary | ICD-10-CM | POA: Diagnosis not present

## 2020-07-08 DIAGNOSIS — G894 Chronic pain syndrome: Secondary | ICD-10-CM | POA: Diagnosis not present

## 2020-08-07 ENCOUNTER — Other Ambulatory Visit (HOSPITAL_COMMUNITY): Payer: Self-pay | Admitting: Internal Medicine

## 2020-08-07 DIAGNOSIS — L97921 Non-pressure chronic ulcer of unspecified part of left lower leg limited to breakdown of skin: Secondary | ICD-10-CM

## 2020-08-07 DIAGNOSIS — G894 Chronic pain syndrome: Secondary | ICD-10-CM | POA: Diagnosis not present

## 2020-08-07 DIAGNOSIS — L97911 Non-pressure chronic ulcer of unspecified part of right lower leg limited to breakdown of skin: Secondary | ICD-10-CM

## 2020-08-07 DIAGNOSIS — L97505 Non-pressure chronic ulcer of other part of unspecified foot with muscle involvement without evidence of necrosis: Secondary | ICD-10-CM | POA: Diagnosis not present

## 2020-08-07 DIAGNOSIS — E1159 Type 2 diabetes mellitus with other circulatory complications: Secondary | ICD-10-CM | POA: Diagnosis not present

## 2020-08-07 DIAGNOSIS — B353 Tinea pedis: Secondary | ICD-10-CM | POA: Diagnosis not present

## 2020-08-08 DIAGNOSIS — E1122 Type 2 diabetes mellitus with diabetic chronic kidney disease: Secondary | ICD-10-CM | POA: Diagnosis not present

## 2020-08-08 DIAGNOSIS — I129 Hypertensive chronic kidney disease with stage 1 through stage 4 chronic kidney disease, or unspecified chronic kidney disease: Secondary | ICD-10-CM | POA: Diagnosis not present

## 2020-08-08 DIAGNOSIS — N1831 Chronic kidney disease, stage 3a: Secondary | ICD-10-CM | POA: Diagnosis not present

## 2020-08-19 ENCOUNTER — Ambulatory Visit: Payer: Medicare Other | Admitting: Orthopedic Surgery

## 2020-08-19 DIAGNOSIS — M6701 Short Achilles tendon (acquired), right ankle: Secondary | ICD-10-CM | POA: Diagnosis not present

## 2020-08-19 DIAGNOSIS — L97511 Non-pressure chronic ulcer of other part of right foot limited to breakdown of skin: Secondary | ICD-10-CM | POA: Diagnosis not present

## 2020-08-19 DIAGNOSIS — M216X9 Other acquired deformities of unspecified foot: Secondary | ICD-10-CM

## 2020-08-19 DIAGNOSIS — M6702 Short Achilles tendon (acquired), left ankle: Secondary | ICD-10-CM

## 2020-08-20 ENCOUNTER — Encounter: Payer: Self-pay | Admitting: Orthopedic Surgery

## 2020-08-20 NOTE — Progress Notes (Signed)
Office Visit Note   Patient: Mike Floyd           Date of Birth: 1960-11-19           MRN: [id number] Visit Date: 08/19/2020              Requested by: Redmond School, [place] [place],  [place] [address] PCP: Redmond School, MD  Chief Complaint  Patient presents with  . Right Foot - Pain  . Left Foot - Pain      HPI: Patient is a 60 year old gentleman who was seen for initial evaluation for ulcerations bilateral feet.  Patient states he has been going to podiatry with callus trimming.  He states this treatment is been ongoing for about 5 months.  He states he still has ulcers beneath the metatarsal heads and has been using Silvadene dressing changes he ambulates with a cane.  Patient does have extra-depth shoes with multilayer orthotics.  Patient states that his sugars run in the 200s he states that he takes insulin 50 to 80 units at night.  Assessment & Plan: Visit Diagnoses:  1. Achilles tendon contracture, bilateral   2. Cavovarus deformity of foot, acquired, unspecified laterality   3. Non-pressure chronic ulcer of other part of right foot limited to breakdown of skin ([place])     Plan: Recommended improvement in the glucose control.  Discussed that we could consider surgical intervention which would include a gastrocnemius recession to unload the forefoot as well as a dorsiflexion osteotomy to the first metatarsal to further unload the forefoot.  Recommended proceeding with surgery on 1 foot.  We will call to set this up at his convenience risk and benefits were discussed including infection nonhealing the wounds need for additional surgery.  Follow-Up Instructions: Return if symptoms worsen or fail to improve, for Follow-up 1 week after surgery established.   Ortho Exam  Patient is alert, oriented, no adenopathy, well-dressed, normal affect, normal respiratory effort. Examination patient ambulates with a cane he has palpable pulses in both feet and has  triphasic pulses bilaterally better in the posterior tibial then dorsalis pedis.  Patient has extreme Achilles contracture bilaterally with his knee extended he has dorsiflexion 20 degrees short of neutral.  He has a cavovarus foot with a plantarflexed first ray.  With the plantarflexed first ray and Achilles contracture he has developed a Wagner grade 1 ulcers beneath the first metatarsal heads bilaterally.  There is no cellulitis no drainage no exposed bone or tendon no signs of any deep infection.  Imaging: No results found. No images are attached to the encounter.  Labs: Lab Results  Component Value Date   HGBA1C 7.0 (H) 06/15/2017   HGBA1C 5.2 03/15/2013   HGBA1C 6.5 (H) 03/07/2012   CRP 2.2 06/27/2019   LABURIC 5.5 03/07/2012   REPTSTATUS 02/21/2008 FINAL 02/19/2008   CULT  02/19/2008    Multiple bacterial morphotypes present, none predominant. Suggest appropriate recollection if clinically indicated.     Lab Results  Component Value Date   ALBUMIN 4.6 12/27/2013   ALBUMIN 4.4 05/30/2013   ALBUMIN 4.2 03/16/2013   LABURIC 5.5 03/07/2012    Lab Results  Component Value Date   MG 1.7 09/05/2012   MG 1.9 06/30/2009   MG 1.9 06/30/2009   Lab Results  Component Value Date   VD25OH 18 (L) 06/15/2017    No results found for: PREALBUMIN CBC EXTENDED Latest Ref Rng & Units 06/27/2019 10/06/2016 09/03/2016  WBC 3.8 - 10.8  Thousand/uL 5.5 6.5 5.9  RBC 4.20 - 5.80 Million/uL 5.64 4.87 4.78  HGB 13.2 - 17.1 g/dL 17.0 15.4 15.2  HCT 38.5 - 50.0 % 49.2 43.7 43.5  PLT 140 - 400 Thousand/uL 137(L) 121(L) 121(L)  NEUTROABS 1,500 - 7,800 cells/uL 3,641 - 4.5  LYMPHSABS 850 - 3,900 cells/uL 1,276 - 0.9     There is no height or weight on file to calculate BMI.  Orders:  No orders of the defined types were placed in this encounter.  No orders of the defined types were placed in this encounter.    Procedures: No procedures performed  Clinical Data: No additional  findings.  ROS:  All other systems negative, except as noted in the HPI. Review of Systems  Objective: Vital Signs: There were no vitals taken for this visit.  Specialty Comments:  No specialty comments available.  PMFS History: Patient Active Problem List   Diagnosis Date Noted  . Diarrhea 06/27/2019  . Diastasis recti 08/22/2018  . Umbilical hernia without obstruction and without gangrene 08/22/2018  . Vitamin D deficiency 06/23/2017  . Essential hypertension, benign 06/15/2017  . Mixed hyperlipidemia 06/15/2017  . Class 1 obesity due to excess calories with serious comorbidity and body mass index (BMI) of 31.0 to 31.9 in adult 06/15/2017  . Malfunction of implantable cardioverter-defibrillator (ICD) electrode 10/07/2016  . Chronic kidney disease (CKD), stage III (moderate) ([place]) 04/04/2015  . Bladder neck obstruction 03/21/2015  . Difficult or painful urination 03/21/2015  . Flank pain 03/21/2015  . Delayed onset of urination 03/21/2015  . Calculus of kidney 03/21/2015  . Erosive esophagitis 12/27/2013  . Hypotension due to drugs 07/02/2013  . Chronic systolic heart failure ([place]) 04/24/2013  . CAP (community acquired pneumonia) 03/16/2013  . HTN (hypertension) 03/15/2013  . Hydrocele 03/15/2013  . Spermatocele 03/15/2013  . DOE (dyspnea on exertion) 03/15/2013  . OSA (obstructive sleep apnea) 01/09/2013  . Chronic kidney disease, stage 3 ([place]) 01/03/2013  . Chest pain 12/26/2012  . History of diagnostic tests 09/06/2012  . TIA (transient ischemic attack)   . Cardiomyopathy, nonischemic ([place]) 02/24/2010  . AICD (automatic cardioverter/defibrillator) 02/24/2010  . DM type 2 causing vascular disease ([place]) 06/27/2009  . Gout 06/27/2009  . Tobacco abuse, in remission 06/27/2009  . COLONIC POLYPS 10/31/2008  . GERD (gastroesophageal reflux disease) 10/31/2008   Past Medical History:  Diagnosis Date  . AICD (automatic cardioverter/defibrillator) present 11/2007   a.  11/2007 [facility] Current VR - single lead ICD  . Anxiety   . Chest pain    a. 10/2007 Cath:  normal [place].  . CKD (chronic kidney disease), stage II   . DDD (degenerative disc disease), lumbar   . Diabetes mellitus DX: 2010  . Erosive esophagitis    a. per EGD (08/2011), Dr. Laural Golden - Erosive reflux esophagitis improved but not completely healed since previous EGD 3 years ago. Bx showing  ulcerated gatroesophageal junction mucosa. negative for H. pylori  . GERD (gastroesophageal reflux disease)   . Gout   . Hearing deficit    a. wear bilateral hearing aides  . History of hiatal hernia   . Hypertension   . Myocardial infarction ([place]) 2011  . Nonischemic dilated cardiomyopathy ([place])    a. H/O EF as low as 35-40% by LV gram 10/2007;  b. Echo 02/2011 EF 50-55%, inf HK, Gr 1 DD   . Renal insufficiency   . Sleep apnea    pt doesnt use, states"I cant afford one". PCP aware  .  Stroke ([place])    mini-stroke in 2014  . TIA (transient ischemic attack)    July, 2013  . Tobacco abuse, in remission 06/27/2009   Discontinued in 2009    . Wears dentures    top plate  . WPW (Wolff-Parkinson-White syndrome)    a. s/p RFCA @ Ages    Family History  Problem Relation Age of Onset  . Heart attack Mother 62  . Hypertension Mother   . Diabetes Mother   . Kidney disease Mother   . Breast cancer Mother   . Heart attack Father 70  . Hypertension Father   . Diabetes Father   . Stroke Brother   . Heart attack Brother 63  . Stroke Maternal Grandmother 80  . Diabetes Brother   . Hypertension Brother     Past Surgical History:  Procedure Laterality Date  . APPENDECTOMY    . BACK SURGERY     1995  . CARDIAC CATHETERIZATION     2009  . CARDIAC DEFIBRILLATOR PLACEMENT    . CHOLECYSTECTOMY    . CHOLECYSTECTOMY, LAPAROSCOPIC     11/2007  . COLONOSCOPY W/ POLYPECTOMY  2009  . ELBOW SURGERY Left 06/2010  . ESOPHAGEAL DILATION N/A 11/08/2014   Procedure: ESOPHAGEAL DILATION;  Surgeon: Rogene Houston,  MD;  Location: [facility];  Service: Endoscopy;  Laterality: N/A;  #56,   . ESOPHAGOGASTRODUODENOSCOPY  03/31/2012   also 08/2011; [name]  . ESOPHAGOGASTRODUODENOSCOPY (EGD) WITH PROPOFOL N/A 11/08/2014   Procedure: ESOPHAGOGASTRODUODENOSCOPY (EGD) WITH PROPOFOL;  Surgeon: Rogene Houston, MD;  Location: [facility];  Service: Endoscopy;  Laterality: N/A;  [place] is 39 , [place] is 37  . ICD LEAD REMOVAL N/A 10/07/2016   Procedure: ICD LEAD REMOVAL ;  Surgeon: Evans Lance, MD;  Location: [facility];  Service: Cardiovascular;  Laterality: N/A;  . MULTIPLE TOOTH EXTRACTIONS    . [place]; performed at [facility] N/A 10/07/2016   Procedure: TRANSESOPHAGEAL ECHOCARDIOGRAM (TEE);  Surgeon: Evans Lance, MD;  Location: [facility] OR;  Service: Cardiovascular;  Laterality: N/A;   Social History   Occupational History  . Occupation: Worked for [employer]: [place] off in 11/11  Tobacco Use  . Smoking status: Former Smoker    Packs/day: 1.50    Years: 35.00    Pack years: 52.50    Types: Cigarettes    Start date: 06/30/1971    Quit date: 07/30/2010    Years since quitting: 10.0  . Smokeless tobacco: Former Systems developer    Types: [place] date: 03/26/1994  . Tobacco comment: currently uses a vape 11-06-2014.  Vaping Use  . Vaping Use: Never used  Substance and Sexual Activity  . Alcohol use: No    Alcohol/week: 1.0 standard drink    Types: 1 Cans of beer per week    Comment: used "years ago"  . Drug use: No  . Sexual activity: Never    Birth control/protection: None

## 2020-08-29 ENCOUNTER — Other Ambulatory Visit: Payer: Self-pay | Admitting: Student

## 2020-09-06 DIAGNOSIS — I10 Essential (primary) hypertension: Secondary | ICD-10-CM | POA: Diagnosis not present

## 2020-09-06 DIAGNOSIS — E119 Type 2 diabetes mellitus without complications: Secondary | ICD-10-CM | POA: Diagnosis not present

## 2020-09-06 DIAGNOSIS — N1831 Chronic kidney disease, stage 3a: Secondary | ICD-10-CM | POA: Diagnosis not present

## 2020-09-08 DIAGNOSIS — N1831 Chronic kidney disease, stage 3a: Secondary | ICD-10-CM | POA: Diagnosis not present

## 2020-09-08 DIAGNOSIS — Z0001 Encounter for general adult medical examination with abnormal findings: Secondary | ICD-10-CM | POA: Diagnosis not present

## 2020-09-08 DIAGNOSIS — G894 Chronic pain syndrome: Secondary | ICD-10-CM | POA: Diagnosis not present

## 2020-09-08 DIAGNOSIS — I10 Essential (primary) hypertension: Secondary | ICD-10-CM | POA: Diagnosis not present

## 2020-09-08 DIAGNOSIS — E1142 Type 2 diabetes mellitus with diabetic polyneuropathy: Secondary | ICD-10-CM | POA: Diagnosis not present

## 2020-09-19 ENCOUNTER — Telehealth: Payer: Self-pay | Admitting: Orthopedic Surgery

## 2020-09-19 NOTE — Telephone Encounter (Signed)
Pt called stating he was supposed to have surgery with Dr.Duda but never got a CB; pt states he has been waiting for over a month and is in terrible pain. He would like a CB as soon as possible.   [phone]

## 2020-09-29 ENCOUNTER — Other Ambulatory Visit: Payer: Self-pay

## 2020-09-30 ENCOUNTER — Other Ambulatory Visit: Payer: Self-pay | Admitting: Physician Assistant

## 2020-10-01 ENCOUNTER — Other Ambulatory Visit (HOSPITAL_COMMUNITY)
Admission: RE | Admit: 2020-10-01 | Discharge: 2020-10-01 | Disposition: A | Discharge status: Home / Self Care | Payer: Medicare Other | Source: Ambulatory Visit | Attending: Orthopedic Surgery | Admitting: Orthopedic Surgery

## 2020-10-01 DIAGNOSIS — Z20822 Contact with and (suspected) exposure to covid-19: Secondary | ICD-10-CM | POA: Diagnosis not present

## 2020-10-01 DIAGNOSIS — Z01812 Encounter for preprocedural laboratory examination: Secondary | ICD-10-CM | POA: Diagnosis not present

## 2020-10-01 LAB — SARS CORONAVIRUS 2 (TAT 6-24 HRS): SARS Coronavirus 2: NEGATIVE

## 2020-10-02 ENCOUNTER — Encounter (HOSPITAL_COMMUNITY): Payer: Self-pay | Admitting: Certified Registered"

## 2020-10-02 ENCOUNTER — Other Ambulatory Visit: Payer: Self-pay

## 2020-10-02 ENCOUNTER — Encounter (HOSPITAL_COMMUNITY): Payer: Self-pay | Admitting: Orthopedic Surgery

## 2020-10-02 NOTE — Progress Notes (Signed)
Mr. Hallstrom denies chest pain or shortness of breath. Patient  tested  negative for Covid and has been in quarantine since that time. Mr. Dansby has type II diabetes, patient reports that CBG runs 210 - 350, has had some High readings on the meter, patient checks CBG 2 times a day.   I instructed Mr Groh to not take [place] Glade today or in am, I instructed patient to not take evening dose of Glimepride evening dose today or in am.  I instructed patient to not take Acarbose in am. I instructed Mr. Freiermuth that if CBG is greater than 70 in am to take 1/2 of the dose of [place] he should take. I instructed patient to check CBG after awaking  and every 2 hours until arrival  to the hospital.  I Instructed patient if CBG is less than 70 to drink 1/2 cup of a clear juice. Recheck CBG in 15 minutes then call pre- op desk at [phone] for further instructions. If scheduled to receive Insulin, do not take Insulin.  Mr. Baize reported that he has been experiencing chest pain, " actually more in the left shoulder, pain is sharp, last approximately 5 minutes.  Patient denies any lightheadedness, diaphoresis, N/V or shortness of breath.  Mr. Whitford said he just waits the pain out, "it is not bad enough to take NTG."  Mr Herder said that he may have this pain 2 -3 times a week. I asked patient if he had informed PCP, patient reportsed that he had not, it has only been happening about 3 months and he has not had an appointment during that time.  I requested records from PCP, Dr Gerarda Fraction.  I asked PA-C for anesthesia to review chart.

## 2020-10-02 NOTE — Progress Notes (Signed)
Anesthesia Chart Review: Same day workup  Follows with cardiology for hx of nonischemic cardiomyopathy, dyspnea, left-sided chest pain, orthopnea,  hypertension, obstructive sleep apnea. History of a nonischemic cardiomyopathy.  Cardiac catheterization in 2009 demonstrated normal coronary arteries.  He has a history of WPW [facility] in 1999. He had initial ICD implantation in 2009 which was removed in March 2018 due to system malfunction. Last seen 07/02/2019, continued to report atypical chest pain described as occasional stabbing midsternal pain without radiation, not associated with exertion. Recommended to continue aspirin 81 mg, carvedilol 12.5 mg, and Imdur 30 mg daily.  CKD stage II  IDDMII.  Hx of erosive esophagitis.  OSA not on CPAP.  Pt overdue for cardiology followup. Spoke with Dr. Jess Barters surgical scheduler to advise that pt would benefit from preop cardiac risk evaluation prior to elective surgery.  Cardiac/coronary CT January 31, 2019 IMPRESSION: 1. Mild CAD, CADRADS = 2. Mild CAD noted in the mid LAD and proximal circumflex artery.   2. The patient's coronary artery calcium score is 45, which places the patient in the 58 percentile, which is average when compared to age and sex matched peers.   3. Normal coronary origin with right dominance.   4. Sinus of Valsalva dilation R-L: 45 mm, L-Non: 42 mm, R-Non: 43 mm measured double oblique. Normal caliber mid ascending aorta.   5. Mild dilation of the main pulmonary artery, 33 mm. This finding could suggest pulmonary hypertension     Echocardiogram January 08, 2019 IMPRESSIONS   1. The left ventricle has mild-moderately reduced systolic function, with an ejection fraction of 40-45%. The cavity size was normal. There is mild concentric left ventricular hypertrophy. Left ventricular diastolic Doppler parameters are consistent  with impaired relaxation. Elevated left ventricular end-diastolic pressure Left  ventrical global hypokinesis without regional wall motion abnormalities.  2. The mitral valve is grossly normal.  3. The tricuspid valve is grossly normal.  4. The aortic valve is tricuspid.  5. The aortic root is normal in size and structure.  6. The interatrial septum was not well visualized. Previous echocardiogram from 2017 for comparison Study Conclusions  - Left ventricle: The cavity size was normal. Wall thickness was   normal. Systolic function was normal. The estimated ejection   fraction was in the range of 55% to 60%. Left ventricular   diastolic function parameters were normal

## 2020-10-03 ENCOUNTER — Ambulatory Visit (HOSPITAL_COMMUNITY): Admission: RE | Admit: 2020-10-03 | Payer: Medicare Other | Source: Home / Self Care | Admitting: Orthopedic Surgery

## 2020-10-03 HISTORY — DX: Polyneuropathy, unspecified: G62.9

## 2020-10-03 SURGERY — RECESSION, MUSCLE, GASTROCNEMIUS
Anesthesia: Choice | Site: Foot | Laterality: Left

## 2020-10-07 NOTE — Progress Notes (Addendum)
Cardiology Office Note:    Date:  10/08/2020   ID:  Mike Floyd, DOB 11-Jan-1961, MRN [id number]  PCP:  Redmond School, [place]  Cardiologist:   Will need to est with Dr. Harl Bowie or Dr. Domenic Polite Advanced Practice Provider: None Electrophysiologist:  Cristopher Peru, MD       Referring MD: Redmond School, MD   Chief Complaint:  Surgical Clearance     Patient Profile:    Mike Floyd is a 60 y.o. male with:   HFmrEF (heart failure with mildly reduced ejection fraction)   Non-ischemic cardiomyopathy   EF previously recovered; decreased again in   Echocardiogram 01/2019: EF 40-45   Coronary artery disease   CT 01/2019: mild to mod nonobstructive plaque  Cath 2009: no CAD   S/p ICD with subsequent extraction   WPW s/p RFA in 1999 ([facility])  Hx of CVA in 2014; TIA in 2013  Diabetes mellitus   Neuropathy   Hypertension   Chronic kidney disease   Erosive esophagitis, GERD   OSA  Peripheral arterial disease   ABIs with L>R tibial disease (06/2019)  Dilated Sinus of Valsalva (45 mm by CT in 01/2019)  Prior CV studies: Coronary CTA 01/31/2019 Ca score 45 RCA min plaque < 25 LM min plaque < 25 LAD mild plaque mid 25-49 LCx prox mild plaque 25-49 SoV dilation (45 mm)  Echocardiogram 01/08/2019 EF 40-45, mild conc LVH, Gr 1 DD  TEE 10/07/16 EF 30-35  Myoview 08/30/16 EF 60, diaph att, no sig ischemia; low risk  Echocardiogram 08/05/16 EF 55-60  Carotid US 03/09/12 No ICA stenosis    History of Present Illness:    Mike Floyd was last seen in 06/2019.  He needs foot surgery and returns for surgical clearance.  He is here today with his wife.  We do not have any records about his upcoming surgery.  He tells me that he has recurrent diabetic foot ulcers.  Surgery will be on his left foot.  The surgeon is Dr. Sharol Given.  Over the past few months, he has noted some left-sided chest discomfort.  This may last 5 to 10 minutes.  It does not  seem to be related to exertion.  He notes chronic exertional shortness of breath.  He feels that this may be worsening over the past several months.  He sleeps on 3 pillows chronically.  He has not had syncope or significant leg edema.      Past Medical History:  Diagnosis Date  . AICD (automatic cardioverter/defibrillator) present 11/2007   a. 11/2007 [facility] Current VR - single lead ICD  - Removed 2018 - "it was burning me"  . Anxiety   . Chest pain    a. 10/2007 Cath:  normal Cors.  . CKD (chronic kidney disease), stage II   . DDD (degenerative disc disease), lumbar   . Diabetes mellitus DX: 2010  . Erosive esophagitis    a. per EGD (08/2011), Dr. Laural Golden - Erosive reflux esophagitis improved but not completely healed since previous EGD 3 years ago. Bx showing  ulcerated gatroesophageal junction mucosa. negative for H. pylori  . GERD (gastroesophageal reflux disease)   . Gout   . Hearing deficit    a. wear bilateral hearing aides  . History of hiatal hernia   . Hypertension   . Myocardial infarction ([place]) 2011  . Neuropathy    Feet and legs  . Nonischemic dilated cardiomyopathy ([place])    a. H/O  EF as low as 35-40% by LV gram 10/2007;  b. Echo 02/2011 EF 50-55%, inf HK, Gr 1 DD   . Renal insufficiency   . Sleep apnea    pt doesnt use, states"I cant afford one". PCP aware  . Stroke ([place])    mini-stroke in 2014  . TIA (transient ischemic attack)    July, 2013  . Tobacco abuse, in remission 06/27/2009   Discontinued in 2009    . Wears dentures    top plate  . WPW (Wolff-Parkinson-White syndrome)    a. s/p RFCA @ [facility] - 1999    Current Medications: Current Meds  Medication Sig  . acarbose (PRECOSE) 100 MG tablet Take 100 mg by mouth 3 (three) times daily.  Marland Kitchen amitriptyline (ELAVIL) 50 MG tablet Take 50 mg by mouth at bedtime.  Marland Kitchen aspirin 81 MG tablet Take 81 mg by mouth daily.  Marland Kitchen atorvastatin (LIPITOR) 20 MG tablet Take 20 mg by mouth daily.  . B-D ULTRAFINE III SHORT PEN 31G X 8  MM MISC USE WITH TRESBIA.  . carvedilol (COREG) 12.5 MG tablet Take 1 tablet (12.5 mg total) by mouth 2 (two) times daily.  . Cholecalciferol (VITAMIN D3) 5000 units CAPS Take 1 capsule (5,000 Units total) by mouth daily.  . famotidine (PEPCID) 20 MG tablet Take 20 mg by mouth daily. am  . FARXIGA 10 MG TABS tablet Take 10 mg by mouth daily.  . furosemide (LASIX) 40 MG tablet TAKE (1) TABLET BY MOUTH ONCE DAILY. (Patient taking differently: Take 40 mg by mouth daily.)  . gabapentin (NEURONTIN) 600 MG tablet Take 300 mg by mouth 3 (three) times daily.  Marland Kitchen glimepiride (AMARYL) 4 MG tablet Take 4 mg by mouth 2 (two) times daily.   . insulin degludec (TRESIBA FLEXTOUCH) 100 UNIT/ML SOPN FlexTouch Pen Inject 0.3 mLs (30 Units total) into the skin at bedtime. (Patient taking differently: Inject 50-80 Units into the skin daily. 50 - 80  based on CBG   AM)  . isosorbide mononitrate (IMDUR) 30 MG 24 hr tablet TAKE (1) TABLET BY MOUTH ONCE DAILY. (Patient taking differently: Take 30 mg by mouth daily.)  . melatonin 5 MG TABS Take 5 mg by mouth at bedtime.  Glory Rosebush VERIO test strip TESTING 4 TIMES DAILY.  Marland Kitchen oxycodone (ROXICODONE) 30 MG immediate release tablet Take 30 mg by mouth in the morning, at noon, in the evening, and at bedtime.  . potassium chloride SA (KLOR-CON) 20 MEQ tablet Take 1 tablet (20 mEq total) by mouth daily.  Marland Kitchen POTASSIUM PO Take 100 mg by mouth daily.  . sucralfate (CARAFATE) 1 g tablet TAKE 1 TABLET BY MOUTH 30 MINTUES BEFORE MEALS AND AT BEDTIME. (Patient taking differently: Take 1 g by mouth daily. TAKE 1 TABLET BY MOUTH 30 MINTUES BEFORE MEALS AND AT BEDTIME.)  . tamsulosin (FLOMAX) 0.4 MG CAPS capsule Take 0.4 mg by mouth daily. am  . vitamin B-12 (CYANOCOBALAMIN) 1000 MCG tablet Take 1,000 mcg by mouth daily.  . [DISCONTINUED] omeprazole (PRILOSEC) 40 MG capsule Take 1 capsule PO qAM (Patient taking differently: Take 40 mg by mouth daily.)     Allergies:   Patient has no known  allergies.   Social History   Tobacco Use  . Smoking status: Former Smoker    Packs/day: 1.50    Years: 35.00    Pack years: 52.50    Types: Cigarettes    Start date: 06/30/1971    Quit date: 07/30/2010    Years  since quitting: 10.2  . Smokeless tobacco: Former Systems developer    Types: [place] date: 03/26/1994  Vaping Use  . Vaping Use: Former  . Start date: 07/30/2010  . Quit date: 06/29/2016  Substance Use Topics  . Alcohol use: No    Alcohol/week: 1.0 standard drink    Types: 1 Cans of beer per week    Comment: used "years ago"  . Drug use: No     Family Hx: The patient's family history includes Breast cancer in his mother; Diabetes in his brother, father, and mother; Heart attack (age of onset: 39) in his brother and father; Heart attack (age of onset: 17) in his mother; Hypertension in his brother, father, and mother; Kidney disease in his mother; Stroke in his brother; Stroke (age of onset: 39) in his maternal grandmother.  ROS   EKGs/Labs/Other Test Reviewed:    EKG:  EKG is   ordered today.  The ekg ordered today demonstrates normal sinus rhythm, heart rate 84, normal axis, nonspecific ST-T wave changes, J-point elevation, QTC 444, no change when compared to prior tracings  Recent Labs: No results found for requested labs within last 8760 hours.   Recent Lipid Panel Lab Results  Component Value Date/Time   CHOL 151 03/15/2013 09:02 AM   TRIG 446 (H) 03/15/2013 09:02 AM   HDL 27 (L) 03/15/2013 09:02 AM   CHOLHDL 5.6 03/15/2013 09:02 AM   LDLCALC UNABLE TO CALCULATE IF TRIGLYCERIDE OVER 400 mg/dL 03/15/2013 09:02 AM      Risk Assessment/Calculations:      Physical Exam:    VS:  BP 118/68   Pulse 90   Ht 6\' 1"  (1.854 m)   Wt 234 lb (106.1 kg)   SpO2 99%   BMI 30.87 kg/m     Wt Readings from Last 3 Encounters:  10/08/20 234 lb (106.1 kg)  08/23/19 232 lb (105.2 kg)  07/31/19 236 lb 4.8 oz (107.2 kg)     Constitutional:      Appearance: Healthy  appearance. Not in distress.  Neck:     Vascular: No JVR. JVD normal.  Pulmonary:     Effort: Pulmonary effort is normal.     Breath sounds: No wheezing. No rales.  Cardiovascular:     Normal rate. Regular rhythm. Normal S1. Normal S2.     Murmurs: There is no murmur.  Edema:    Peripheral edema absent.  Abdominal:     Palpations: Abdomen is soft. There is no hepatomegaly.  Skin:    General: Skin is warm and dry.  Neurological:     General: No focal deficit present.     Mental Status: Alert and oriented to person, place and time.     Cranial Nerves: Cranial nerves are intact.       ASSESSMENT & PLAN:    1. Preoperative cardiovascular examination   Mr. Longshore perioperative risk of a major cardiac event is 11% according to the Revised Cardiac Risk Index (RCRI).  Therefore, he is at high risk for perioperative complications.   His functional capacity is poor at 3.73 METs according to the Duke Activity Status Index (DASI).  He also notes a recent history of chest discomfort as well as worsening exertional shortness of breath.  Overall, his symptoms sound noncardiac in nature.  I suspect that they are more related to deconditioning from his foot issues. Recommendations: According to ACC/AHA guidelines, the patient will require stress testing for further risk stratification. We will make  a determination on surgical risk based upon these results.     2. HFrEF (heart failure with reduced ejection fraction) ([place]) EF 40-45 by echocardiogram 01/2019.  CT in 2020 demonstrated mild to moderate nonobstructive CAD.  Therefore, he has a nonischemic cardiomyopathy.  NYHA III.  He is mainly limited due to foot ulcers.  His volume appears stable.  He is not on ARB for unclear reasons.  It may be related to his renal function.  Continue current dose of beta-blocker, isosorbide.  If his blood pressure and renal function will tolerate it, ARB versus hydralazine could be considered at follow-up.  3.  Coronary artery disease involving native coronary artery of native heart without angina pectoris Nonobstructive disease by coronary CTA in 2020.  He does have chest discomfort and needs surgical clearance.  Therefore, he will undergo a [employer] as outlined above.  Continue aspirin, statin.  As far as his surgery goes, if needed, he would be able to hold aspirin.  4. Enlarged sinus of Valsalva, acquired ([place]) This was noted on CT scan in 2020.  Arrange cardiac MRI to better size his sinus of Valsalva.  5. Stage 3a chronic kidney disease ([place]) Most recent creatinine 1.49 in 03/2020 ([facility]).  6. Essential hypertension The patient's blood pressure is controlled on his current regimen.  Continue current therapy.    Shared Decision Making/Informed Consent The risks [chest pain, shortness of breath, cardiac arrhythmias, dizziness, blood pressure fluctuations, myocardial infarction, stroke/transient ischemic attack, nausea, vomiting, allergic reaction, radiation exposure, metallic taste sensation and life-threatening complications (estimated to be 1 in 10,000)], benefits (risk stratification, diagnosing coronary artery disease, treatment guidance) and alternatives of a nuclear stress test were discussed in detail with Mr. Dino and he agrees to proceed.   Dispo:  Return in about 3 months (around 01/08/2021) for Routine Follow Up with Dr. Harl Bowie or Dr. Domenic Polite.   Medication Adjustments/Labs and Tests Ordered: Current medicines are reviewed at length with the patient today.  Concerns regarding medicines are outlined above.  Tests Ordered: Orders Placed This Encounter  Procedures  . NM Myocar Multi W/Spect W/Wall Motion / EF  . MR Card Morphology Wo/W Cm  . EKG 12-Lead   Medication Changes: No orders of the defined types were placed in this encounter.   Signed, Richardson Dopp, PA-C  10/08/2020 5:33 PM    [facility]   ADDENDUM NM Myocar Multi W/Spect W/Wall Motion /  EF 10/13/2020 Narrative  No diagnostic ST segment changes indicate ischemia.  Small, mild to moderate intensity, apical to basal inferoseptal defect that is fixed and consistent with scar, no significant ischemia.  This is a low risk study.  Nuclear stress EF: 57%.  As the patient's nuclear stress test is low risk, he may proceed with surgery at acceptable CV risk.   Richardson Dopp, PA-C    10/14/2020 10:59 AM

## 2020-10-08 ENCOUNTER — Encounter: Payer: Self-pay | Admitting: *Deleted

## 2020-10-08 ENCOUNTER — Other Ambulatory Visit: Payer: Self-pay

## 2020-10-08 ENCOUNTER — Ambulatory Visit: Payer: Medicare Other | Admitting: Physician Assistant

## 2020-10-08 ENCOUNTER — Encounter: Payer: Self-pay | Admitting: Physician Assistant

## 2020-10-08 ENCOUNTER — Other Ambulatory Visit (INDEPENDENT_AMBULATORY_CARE_PROVIDER_SITE_OTHER): Payer: Self-pay

## 2020-10-08 VITALS — BP 118/68 | HR 90 | Ht 73.0 in | Wt 234.0 lb

## 2020-10-08 DIAGNOSIS — K219 Gastro-esophageal reflux disease without esophagitis: Secondary | ICD-10-CM

## 2020-10-08 DIAGNOSIS — I10 Essential (primary) hypertension: Secondary | ICD-10-CM | POA: Diagnosis not present

## 2020-10-08 DIAGNOSIS — I5020 Unspecified systolic (congestive) heart failure: Secondary | ICD-10-CM

## 2020-10-08 DIAGNOSIS — Z01810 Encounter for preprocedural cardiovascular examination: Secondary | ICD-10-CM | POA: Diagnosis not present

## 2020-10-08 DIAGNOSIS — N1831 Chronic kidney disease, stage 3a: Secondary | ICD-10-CM

## 2020-10-08 DIAGNOSIS — I428 Other cardiomyopathies: Secondary | ICD-10-CM | POA: Diagnosis not present

## 2020-10-08 DIAGNOSIS — I7121 Aneurysm of the ascending aorta, without rupture: Secondary | ICD-10-CM

## 2020-10-08 DIAGNOSIS — I2510 Atherosclerotic heart disease of native coronary artery without angina pectoris: Secondary | ICD-10-CM | POA: Diagnosis not present

## 2020-10-08 DIAGNOSIS — I719 Aortic aneurysm of unspecified site, without rupture: Secondary | ICD-10-CM

## 2020-10-08 DIAGNOSIS — G894 Chronic pain syndrome: Secondary | ICD-10-CM | POA: Diagnosis not present

## 2020-10-08 DIAGNOSIS — E1140 Type 2 diabetes mellitus with diabetic neuropathy, unspecified: Secondary | ICD-10-CM | POA: Diagnosis not present

## 2020-10-08 DIAGNOSIS — R1319 Other dysphagia: Secondary | ICD-10-CM

## 2020-10-08 MED ORDER — OMEPRAZOLE 40 MG PO CPDR: 40.0000 mg | DELAYED_RELEASE_CAPSULE | Freq: Every day | ORAL | 0 refills | Status: DC

## 2020-10-08 NOTE — Patient Instructions (Signed)
Medication Instructions:  Your physician recommends that you continue on your current medications as directed. Please refer to the Current Medication list given to you today.  *If you need a refill on your cardiac medications before your next appointment, please call your pharmacy*   Lab Work: -None If you have labs (blood work) drawn today and your tests are completely normal, you will receive your results only by: Marland Kitchen MyChart Message (if you have MyChart) OR . A paper copy in the mail If you have any lab test that is abnormal or we need to change your treatment, we will call you to review the results.   Testing/Procedures: Your physician has requested that you have a lexiscan myoview. For further information please visit [email or web address]. Please follow instruction sheet, as given.  Your physician has requested that you have a cardiac MRI.  2-3 months for enlarged sinus of valsalva. . Cardiac MRI uses a computer to create images of your heart as its beating, producing both still and moving pictures of your heart and major blood vessels. For further information please visit [email or web address] Please follow the instruction sheet given to you today for more information.      Follow-Up: At [facility], you and your health needs are our priority.  As part of our continuing mission to provide you with exceptional heart care, we have created designated Provider Care Teams.  These Care Teams include your primary Cardiologist (physician) and Advanced Practice Providers (APPs -  Physician Assistants and Nurse Practitioners) who all work together to provide you with the care you need, when you need it.  We recommend signing up for the patient portal called "MyChart".  Sign up information is provided on this After Visit Summary.  MyChart is used to connect with patients for Virtual Visits (Telemedicine).  Patients are able to view lab/test results, encounter notes, upcoming appointments, etc.   Non-urgent messages can be sent to your provider as well.   To learn more about what you can do with MyChart, go to [email or web address].    Your next appointment:   3 month(s)  The format for your next appointment:   In Person  Provider:   Carlyle Dolly, MD   Other Instructions -None

## 2020-10-08 NOTE — Telephone Encounter (Signed)
Will refill medication for 3 months, needs follow up appointment with any provider in order to receive any refills. ? ?Thanks, ? ?Sheryl Saintil Castaneda, MD ?Gastroenterology and Hepatology ?[facility] ? ?

## 2020-10-09 ENCOUNTER — Telehealth: Payer: Self-pay | Admitting: Physician Assistant

## 2020-10-09 NOTE — Telephone Encounter (Signed)
Spoke with patient to discuss scheduling the Cardiac MRI ordered by Richardson Dopp, PA,  Informed patient as soon as we hear regarding the insurance prior authorization, I will be in touch with the appointment.  Patient voiced his understanding.

## 2020-10-10 ENCOUNTER — Encounter: Payer: Self-pay | Admitting: Cardiovascular Disease

## 2020-10-10 NOTE — Telephone Encounter (Signed)
Spoke with patient regarding 11/17/20 8:00 am appointment for his Cardiac MRI----arrival time is 7:30 am--1st floor admissions office at [facility].  WIll mail information to patient and he voiced his understanding.

## 2020-10-13 ENCOUNTER — Ambulatory Visit (HOSPITAL_COMMUNITY)
Admission: RE | Admit: 2020-10-13 | Discharge: 2020-10-13 | Disposition: A | Discharge status: Home / Self Care | Payer: Medicare Other | Source: Ambulatory Visit | Attending: Physician Assistant | Admitting: Physician Assistant

## 2020-10-13 ENCOUNTER — Other Ambulatory Visit: Payer: Self-pay

## 2020-10-13 ENCOUNTER — Encounter (HOSPITAL_COMMUNITY)
Admission: RE | Admit: 2020-10-13 | Discharge: 2020-10-13 | Disposition: A | Discharge status: Home / Self Care | Payer: Medicare Other | Source: Ambulatory Visit | Attending: Physician Assistant | Admitting: Physician Assistant

## 2020-10-13 ENCOUNTER — Other Ambulatory Visit: Payer: Self-pay | Admitting: Student

## 2020-10-13 DIAGNOSIS — I719 Aortic aneurysm of unspecified site, without rupture: Secondary | ICD-10-CM | POA: Diagnosis not present

## 2020-10-13 DIAGNOSIS — I7121 Aneurysm of the ascending aorta, without rupture: Secondary | ICD-10-CM

## 2020-10-13 LAB — NM MYOCAR MULTI W/SPECT W/WALL MOTION / EF
LV dias vol: 79 mL (ref 62–150)
LV sys vol: 34 mL
Peak HR: 108 {beats}/min
RATE: 0.36
Rest HR: 90 {beats}/min
SDS: 0
SRS: 4
SSS: 4
TID: 1.21

## 2020-10-13 MED ORDER — TECHNETIUM TC 99M TETROFOSMIN IV KIT
30.0000 | PACK | Freq: Once | INTRAVENOUS | Status: AC | PRN
  Administered 2020-10-13: 30 via INTRAVENOUS

## 2020-10-13 MED ORDER — SODIUM CHLORIDE FLUSH 0.9 % IV SOLN
INTRAVENOUS | Status: AC
  Administered 2020-10-13: 10 mL via INTRAVENOUS
  Filled 2020-10-13: qty 10

## 2020-10-13 MED ORDER — TECHNETIUM TC 99M TETROFOSMIN IV KIT
10.0000 | PACK | Freq: Once | INTRAVENOUS | Status: AC | PRN
  Administered 2020-10-13: 10.5 via INTRAVENOUS

## 2020-10-13 MED ORDER — REGADENOSON 0.4 MG/5ML IV SOLN
INTRAVENOUS | Status: AC
  Administered 2020-10-13: 0.4 mg via INTRAVENOUS
  Filled 2020-10-13: qty 5

## 2020-10-14 ENCOUNTER — Telehealth: Payer: Self-pay

## 2020-10-14 ENCOUNTER — Encounter: Payer: Self-pay | Admitting: Physician Assistant

## 2020-10-14 DIAGNOSIS — I2510 Atherosclerotic heart disease of native coronary artery without angina pectoris: Secondary | ICD-10-CM | POA: Insufficient documentation

## 2020-10-14 NOTE — Telephone Encounter (Signed)
Contacted patient who verbalized understanding and has no questions or concerns at this time.

## 2020-10-14 NOTE — Telephone Encounter (Signed)
-----   Message from Liliane Shi, [place] sent at 10/14/2020 11:01 AM EST ----- Please call the pt. His stress shows that his EF is improved.  It is now normal.  There is no signs of a new blockage (no ischemia).  There is an area in the bottom part of the heart that could represent an old heart attack vs artifact from the diaphragm or gut.  The cardiac MRI that has been ordered will allow [place] to look at this area.  However, it is a good sign that his EF is now normal.   PLAN:  - He may proceed with his surgery at acceptable risk - Keep planned f/u - I will send a note to the surgeon.  Richardson Dopp, PA-C    10/14/2020 10:50 AM

## 2020-10-23 ENCOUNTER — Other Ambulatory Visit (HOSPITAL_COMMUNITY)
Admission: RE | Admit: 2020-10-23 | Discharge: 2020-10-23 | Disposition: A | Discharge status: Home / Self Care | Payer: Medicare Other | Source: Ambulatory Visit | Attending: Orthopedic Surgery | Admitting: Orthopedic Surgery

## 2020-10-23 ENCOUNTER — Other Ambulatory Visit: Payer: Self-pay | Admitting: Physician Assistant

## 2020-10-23 DIAGNOSIS — Z20822 Contact with and (suspected) exposure to covid-19: Secondary | ICD-10-CM | POA: Insufficient documentation

## 2020-10-23 DIAGNOSIS — Z01812 Encounter for preprocedural laboratory examination: Secondary | ICD-10-CM | POA: Diagnosis not present

## 2020-10-23 LAB — SARS CORONAVIRUS 2 (TAT 6-24 HRS): SARS Coronavirus 2: NEGATIVE

## 2020-10-23 NOTE — Progress Notes (Addendum)
PCP: Redmond School, MD Cardiologist: Richardson Dopp, PA-C  EKG:  10/08/20 CXR: 12/29/17 ECHO: 01/08/19 Stress Test: 10/13/20 in [place] Cardiac Cath: denies  Fasting Blood Sugar-  Checks Blood Sugar___ times a day  OSA: Yes, does not wear CPAP  ASA/Blood Thinners: No  Covid test 3/17 pending  Anesthesia Review:  Yes, recent stress test. Cardiology note 10/08/20.  ICD was removed.   Patient denies shortness of breath, fever, cough, and chest pain at PAT appointment.  Patient verbalized understanding of instructions provided today at the PAT appointment.  Patient asked to review instructions at home and day of surgery.

## 2020-10-23 NOTE — Progress Notes (Signed)
Spoke to Dr. Therisa Doyne regarding cardiac history.  MD aware.

## 2020-10-24 ENCOUNTER — Ambulatory Visit (HOSPITAL_COMMUNITY): Payer: Medicare Other | Admitting: Anesthesiology

## 2020-10-24 ENCOUNTER — Encounter (HOSPITAL_COMMUNITY): Payer: Self-pay | Admitting: Orthopedic Surgery

## 2020-10-24 ENCOUNTER — Encounter (HOSPITAL_COMMUNITY)
Admission: RE | Disposition: A | Discharge status: Home / Self Care | Payer: Self-pay | Source: Home / Self Care | Attending: Orthopedic Surgery

## 2020-10-24 ENCOUNTER — Other Ambulatory Visit: Payer: Self-pay | Admitting: Physician Assistant

## 2020-10-24 ENCOUNTER — Other Ambulatory Visit: Payer: Self-pay

## 2020-10-24 ENCOUNTER — Ambulatory Visit (HOSPITAL_COMMUNITY)
Admission: RE | Admit: 2020-10-24 | Discharge: 2020-10-24 | Disposition: A | Discharge status: Home / Self Care | Payer: Medicare Other | Attending: Orthopedic Surgery | Admitting: Orthopedic Surgery

## 2020-10-24 ENCOUNTER — Ambulatory Visit (HOSPITAL_COMMUNITY): Payer: Medicare Other

## 2020-10-24 DIAGNOSIS — Z794 Long term (current) use of insulin: Secondary | ICD-10-CM | POA: Diagnosis not present

## 2020-10-24 DIAGNOSIS — I129 Hypertensive chronic kidney disease with stage 1 through stage 4 chronic kidney disease, or unspecified chronic kidney disease: Secondary | ICD-10-CM | POA: Insufficient documentation

## 2020-10-24 DIAGNOSIS — G4730 Sleep apnea, unspecified: Secondary | ICD-10-CM | POA: Diagnosis not present

## 2020-10-24 DIAGNOSIS — I420 Dilated cardiomyopathy: Secondary | ICD-10-CM | POA: Diagnosis not present

## 2020-10-24 DIAGNOSIS — Z7984 Long term (current) use of oral hypoglycemic drugs: Secondary | ICD-10-CM | POA: Diagnosis not present

## 2020-10-24 DIAGNOSIS — Z9049 Acquired absence of other specified parts of digestive tract: Secondary | ICD-10-CM | POA: Insufficient documentation

## 2020-10-24 DIAGNOSIS — Z841 Family history of disorders of kidney and ureter: Secondary | ICD-10-CM | POA: Insufficient documentation

## 2020-10-24 DIAGNOSIS — L97529 Non-pressure chronic ulcer of other part of left foot with unspecified severity: Secondary | ICD-10-CM | POA: Diagnosis not present

## 2020-10-24 DIAGNOSIS — N182 Chronic kidney disease, stage 2 (mild): Secondary | ICD-10-CM | POA: Diagnosis not present

## 2020-10-24 DIAGNOSIS — M21962 Unspecified acquired deformity of left lower leg: Secondary | ICD-10-CM

## 2020-10-24 DIAGNOSIS — Z8673 Personal history of transient ischemic attack (TIA), and cerebral infarction without residual deficits: Secondary | ICD-10-CM | POA: Diagnosis not present

## 2020-10-24 DIAGNOSIS — Z833 Family history of diabetes mellitus: Secondary | ICD-10-CM | POA: Insufficient documentation

## 2020-10-24 DIAGNOSIS — Z8249 Family history of ischemic heart disease and other diseases of the circulatory system: Secondary | ICD-10-CM | POA: Insufficient documentation

## 2020-10-24 DIAGNOSIS — I252 Old myocardial infarction: Secondary | ICD-10-CM | POA: Insufficient documentation

## 2020-10-24 DIAGNOSIS — L97511 Non-pressure chronic ulcer of other part of right foot limited to breakdown of skin: Secondary | ICD-10-CM | POA: Insufficient documentation

## 2020-10-24 DIAGNOSIS — E11621 Type 2 diabetes mellitus with foot ulcer: Secondary | ICD-10-CM | POA: Insufficient documentation

## 2020-10-24 DIAGNOSIS — I2510 Atherosclerotic heart disease of native coronary artery without angina pectoris: Secondary | ICD-10-CM | POA: Insufficient documentation

## 2020-10-24 DIAGNOSIS — E1140 Type 2 diabetes mellitus with diabetic neuropathy, unspecified: Secondary | ICD-10-CM | POA: Insufficient documentation

## 2020-10-24 DIAGNOSIS — Z823 Family history of stroke: Secondary | ICD-10-CM | POA: Diagnosis not present

## 2020-10-24 DIAGNOSIS — Z87891 Personal history of nicotine dependence: Secondary | ICD-10-CM | POA: Diagnosis not present

## 2020-10-24 DIAGNOSIS — M6702 Short Achilles tendon (acquired), left ankle: Secondary | ICD-10-CM | POA: Insufficient documentation

## 2020-10-24 DIAGNOSIS — E1122 Type 2 diabetes mellitus with diabetic chronic kidney disease: Secondary | ICD-10-CM | POA: Diagnosis not present

## 2020-10-24 HISTORY — PX: FOOT ARTHRODESIS: SHX1655

## 2020-10-24 LAB — GLUCOSE, CAPILLARY
Glucose-Capillary: 249 mg/dL — ABNORMAL HIGH (ref 70–99)
Glucose-Capillary: 191 mg/dL — ABNORMAL HIGH (ref 70–99)
Glucose-Capillary: 168 mg/dL — ABNORMAL HIGH (ref 70–99)

## 2020-10-24 LAB — CBC
HCT: 42.4 % (ref 39.0–52.0)
Hemoglobin: 15.3 g/dL (ref 13.0–17.0)
MCH: 30.4 pg (ref 26.0–34.0)
MCHC: 36.1 g/dL — ABNORMAL HIGH (ref 30.0–36.0)
MCV: 84.3 fL (ref 80.0–100.0)
Platelets: 147 10*3/uL — ABNORMAL LOW (ref 150–400)
RBC: 5.03 MIL/uL (ref 4.22–5.81)
RDW: 14.6 % (ref 11.5–15.5)
WBC: 6.4 10*3/uL (ref 4.0–10.5)
nRBC: 0 % (ref 0.0–0.2)

## 2020-10-24 LAB — BASIC METABOLIC PANEL
Anion gap: 7 (ref 5–15)
BUN: 11 mg/dL (ref 8–23)
CO2: 29 mmol/L (ref 22–32)
Calcium: 9.1 mg/dL (ref 8.9–10.3)
Chloride: 96 mmol/L — ABNORMAL LOW (ref 98–111)
Creatinine, Ser: 1.3 mg/dL — ABNORMAL HIGH (ref 0.61–1.24)
GFR, Estimated: >60 mL/min (ref >60)
Glucose, Bld: 209 mg/dL — ABNORMAL HIGH (ref 70–99)
Potassium: 3.9 mmol/L (ref 3.5–5.1)
Sodium: 132 mmol/L — ABNORMAL LOW (ref 135–145)

## 2020-10-24 SURGERY — FUSION, JOINT, FOOT
Anesthesia: General | Site: Foot | Laterality: Left

## 2020-10-24 MED ORDER — FENTANYL CITRATE (PF) 250 MCG/5ML IJ SOLN
INTRAMUSCULAR | Status: AC
  Filled 2020-10-24: qty 5

## 2020-10-24 MED ORDER — ONDANSETRON HCL 4 MG/2ML IJ SOLN: 4.0000 mg | Freq: Once | INTRAMUSCULAR | Status: DC | PRN

## 2020-10-24 MED ORDER — ONDANSETRON HCL 4 MG/2ML IJ SOLN
INTRAMUSCULAR | Status: DC | PRN
  Administered 2020-10-24: 4 mg via INTRAVENOUS

## 2020-10-24 MED ORDER — CEFAZOLIN SODIUM-DEXTROSE 2-4 GM/100ML-% IV SOLN
2.0000 g | INTRAVENOUS | Status: AC
  Administered 2020-10-24: 2 g via INTRAVENOUS
  Filled 2020-10-24: qty 100

## 2020-10-24 MED ORDER — BUPIVACAINE HCL (PF) 0.25 % IJ SOLN
INTRAMUSCULAR | Status: AC
  Filled 2020-10-24: qty 30

## 2020-10-24 MED ORDER — BUPIVACAINE HCL (PF) 0.25 % IJ SOLN
INTRAMUSCULAR | Status: DC | PRN
  Administered 2020-10-24: 10 mL

## 2020-10-24 MED ORDER — MIDAZOLAM HCL 2 MG/2ML IJ SOLN
INTRAMUSCULAR | Status: AC
  Filled 2020-10-24: qty 2

## 2020-10-24 MED ORDER — LIDOCAINE 2% (20 MG/ML) 5 ML SYRINGE
INTRAMUSCULAR | Status: DC | PRN
  Administered 2020-10-24: 60 mg via INTRAVENOUS

## 2020-10-24 MED ORDER — OXYCODONE-ACETAMINOPHEN 5-325 MG PO TABS: 1.0000 | ORAL_TABLET | ORAL | 0 refills | Status: DC | PRN

## 2020-10-24 MED ORDER — DOXYCYCLINE HYCLATE 100 MG PO TABS: 100.0000 mg | ORAL_TABLET | Freq: Two times a day (BID) | ORAL | 0 refills | Status: DC

## 2020-10-24 MED ORDER — ORAL CARE MOUTH RINSE: 15.0000 mL | Freq: Once | OROMUCOSAL | Status: AC

## 2020-10-24 MED ORDER — FENTANYL CITRATE (PF) 250 MCG/5ML IJ SOLN
INTRAMUSCULAR | Status: DC | PRN
  Administered 2020-10-24 (×3): 50 ug via INTRAVENOUS

## 2020-10-24 MED ORDER — AMISULPRIDE (ANTIEMETIC) 5 MG/2ML IV SOLN: 10.0000 mg | Freq: Once | INTRAVENOUS | Status: DC | PRN

## 2020-10-24 MED ORDER — OXYCODONE HCL 5 MG PO TABS
5.0000 mg | ORAL_TABLET | Freq: Once | ORAL | Status: DC | PRN
Start: 2020-10-24 — End: 2020-10-24

## 2020-10-24 MED ORDER — OXYCODONE HCL 5 MG/5ML PO SOLN: 5.0000 mg | Freq: Once | ORAL | Status: DC | PRN

## 2020-10-24 MED ORDER — FENTANYL CITRATE (PF) 100 MCG/2ML IJ SOLN
25.0000 ug | INTRAMUSCULAR | Status: DC | PRN
Start: 2020-10-24 — End: 2020-10-24
  Administered 2020-10-24 (×2): 50 ug via INTRAVENOUS

## 2020-10-24 MED ORDER — CHLORHEXIDINE GLUCONATE 0.12 % MT SOLN
15.0000 mL | Freq: Once | OROMUCOSAL | Status: AC
  Administered 2020-10-24: 15 mL via OROMUCOSAL
  Filled 2020-10-24: qty 15

## 2020-10-24 MED ORDER — DEXAMETHASONE SODIUM PHOSPHATE 10 MG/ML IJ SOLN
INTRAMUSCULAR | Status: DC | PRN
  Administered 2020-10-24: 5 mg via INTRAVENOUS

## 2020-10-24 MED ORDER — LACTATED RINGERS IV SOLN: INTRAVENOUS | Status: DC

## 2020-10-24 MED ORDER — FENTANYL CITRATE (PF) 100 MCG/2ML IJ SOLN
INTRAMUSCULAR | Status: AC
  Filled 2020-10-24: qty 2

## 2020-10-24 MED ORDER — PROPOFOL 10 MG/ML IV BOLUS
INTRAVENOUS | Status: DC | PRN
  Administered 2020-10-24: 50 mg via INTRAVENOUS
  Administered 2020-10-24: 100 mg via INTRAVENOUS
  Administered 2020-10-24: 50 mg via INTRAVENOUS

## 2020-10-24 SURGICAL SUPPLY — 41 items
BANDAGE ESMARK 6X9 LF (GAUZE/BANDAGES/DRESSINGS) IMPLANT
BLADE SAW SGTL HD 18.5X60.5X1. (BLADE) ×2 IMPLANT
BLADE SURG 10 STRL SS (BLADE) IMPLANT
BNDG COHESIVE 4X5 TAN STRL (GAUZE/BANDAGES/DRESSINGS) ×2 IMPLANT
BNDG COHESIVE 6X5 TAN NS LF (GAUZE/BANDAGES/DRESSINGS) ×2 IMPLANT
BNDG ESMARK 6X9 LF (GAUZE/BANDAGES/DRESSINGS)
BNDG GAUZE ELAST 4 BULKY (GAUZE/BANDAGES/DRESSINGS) ×2 IMPLANT
COTTON STERILE ROLL (GAUZE/BANDAGES/DRESSINGS) ×2 IMPLANT
COVER MAYO STAND STRL (DRAPES) IMPLANT
COVER SURGICAL LIGHT HANDLE (MISCELLANEOUS) ×4 IMPLANT
COVER WAND RF STERILE (DRAPES) ×2 IMPLANT
DRAPE INCISE IOBAN 66X45 STRL (DRAPES) ×2 IMPLANT
DRAPE OEC MINIVIEW 54X84 (DRAPES) IMPLANT
DRAPE U-SHAPE 47X51 STRL (DRAPES) ×2 IMPLANT
DRSG ADAPTIC 3X8 NADH LF (GAUZE/BANDAGES/DRESSINGS) ×2 IMPLANT
DURAPREP 26ML APPLICATOR (WOUND CARE) ×2 IMPLANT
ELECT REM PT RETURN 9FT ADLT (ELECTROSURGICAL) ×2
ELECTRODE REM PT RTRN 9FT ADLT (ELECTROSURGICAL) ×1 IMPLANT
GAUZE SPONGE 4X4 12PLY STRL (GAUZE/BANDAGES/DRESSINGS) ×2 IMPLANT
GLOVE BIOGEL PI IND STRL 9 (GLOVE) ×1 IMPLANT
GLOVE BIOGEL PI INDICATOR 9 (GLOVE) ×1
GLOVE SURG ORTHO 9.0 STRL STRW (GLOVE) ×2 IMPLANT
GOWN STRL REUS W/ TWL XL LVL3 (GOWN DISPOSABLE) ×3 IMPLANT
GOWN STRL REUS W/TWL XL LVL3 (GOWN DISPOSABLE) ×6
KIT BASIN OR (CUSTOM PROCEDURE TRAY) ×2 IMPLANT
KIT STAPLE ARCUS 20X17 STRL (Staple) ×2 IMPLANT
KIT TURNOVER KIT B (KITS) ×2 IMPLANT
MANIFOLD NEPTUNE II (INSTRUMENTS) ×2 IMPLANT
NS IRRIG 1000ML POUR BTL (IV SOLUTION) ×2 IMPLANT
PACK ORTHO EXTREMITY (CUSTOM PROCEDURE TRAY) ×2 IMPLANT
PAD ARMBOARD 7.5X6 YLW CONV (MISCELLANEOUS) ×4 IMPLANT
PAD CAST 4YDX4 CTTN HI CHSV (CAST SUPPLIES) ×1 IMPLANT
PADDING CAST COTTON 4X4 STRL (CAST SUPPLIES) ×2
SPONGE LAP 18X18 RF (DISPOSABLE) ×2 IMPLANT
SUCTION FRAZIER HANDLE 10FR (MISCELLANEOUS) ×2
SUCTION TUBE FRAZIER 10FR DISP (MISCELLANEOUS) ×1 IMPLANT
SUT ETHILON 2 0 PSLX (SUTURE) ×6 IMPLANT
TOWEL GREEN STERILE (TOWEL DISPOSABLE) ×2 IMPLANT
TOWEL GREEN STERILE FF (TOWEL DISPOSABLE) ×2 IMPLANT
TUBE CONNECTING 12X1/4 (SUCTIONS) ×2 IMPLANT
WATER STERILE IRR 1000ML POUR (IV SOLUTION) ×2 IMPLANT

## 2020-10-24 NOTE — Anesthesia Preprocedure Evaluation (Signed)
Anesthesia Evaluation  Patient identified by MRN, date of birth, ID band Patient awake    Reviewed: Allergy & Precautions, NPO status , Patient's Chart, lab work & pertinent test results, reviewed documented beta blocker date and time   History of Anesthesia Complications Negative for: history of anesthetic complications  Airway Mallampati: III  TM Distance: >3 FB Neck ROM: Full    Dental  (+) Edentulous Upper, Edentulous Lower   Pulmonary sleep apnea , former smoker,    Pulmonary exam normal        Cardiovascular hypertension, Pt. on medications and Pt. on home beta blockers + CAD, + Past MI, + Peripheral Vascular Disease and +CHF  Normal cardiovascular exam+ dysrhythmias (WPW)      Neuro/Psych TIA (2014)negative psych ROS   GI/Hepatic Neg liver ROS, hiatal hernia, PUD, GERD  ,  Endo/Other  diabetes, Insulin Dependent, Oral Hypoglycemic Agents  Renal/GU Renal InsufficiencyRenal disease  negative genitourinary   Musculoskeletal negative musculoskeletal ROS (+)   Abdominal   Peds  Hematology negative hematology ROS (+)   Anesthesia Other Findings  Myoview 10/13/20:   No diagnostic ST segment changes indicate ischemia.  Small, mild to moderate intensity, apical to basal inferoseptal defect that is fixed and consistent with scar, no significant ischemia.  This is a low risk study.  Nuclear stress EF: 57%.  Echo 01/08/19: 1. The left ventricle has mild-moderately reduced systolic function, with  an ejection fraction of 40-45%. The cavity size was normal. There is mild  concentric left ventricular hypertrophy. Left ventricular diastolic  Doppler parameters are consistent  with impaired relaxation. Elevated left ventricular end-diastolic pressure  Left ventrical global hypokinesis without regional wall motion  abnormalities.  2. The mitral valve is grossly normal.  3. The tricuspid valve is grossly normal.  4.  The aortic valve is tricuspid.  5. The aortic root is normal in size and structure.  6. The interatrial septum was not well visualized.   Reproductive/Obstetrics                            Anesthesia Physical Anesthesia Plan  ASA: III  Anesthesia Plan: General   Post-op Pain Management:    Induction: Intravenous  PONV Risk Score and Plan: 2 and Ondansetron, Dexamethasone, Midazolam and Treatment may vary due to age or medical condition  Airway Management Planned: LMA  Additional Equipment: None  Intra-op Plan:   Post-operative Plan: Extubation in OR  Informed Consent: I have reviewed the patients History and Physical, chart, labs and discussed the procedure including the risks, benefits and alternatives for the proposed anesthesia with the patient or authorized representative who has indicated his/her understanding and acceptance.     Dental advisory given  Plan Discussed with:   Anesthesia Plan Comments:         Anesthesia Quick Evaluation

## 2020-10-24 NOTE — Anesthesia Postprocedure Evaluation (Signed)
Anesthesia Post Note  Patient: [name]  Procedure(s) Performed: LEFT GASTROCNEMIUS RECESSION, DORSIFLEXION OSTEOTOMY 1ST MT (Left Foot)     Patient location during evaluation: PACU Anesthesia Type: General Level of consciousness: awake and alert Pain management: pain level controlled Vital Signs Assessment: post-procedure vital signs reviewed and stable Respiratory status: spontaneous breathing, nonlabored ventilation and respiratory function stable Cardiovascular status: blood pressure returned to baseline and stable Postop Assessment: no apparent nausea or vomiting Anesthetic complications: no   No complications documented.  Last Vitals:  Vitals:   10/24/20 1415 10/24/20 1428  BP: 118/77 109/77  Pulse: 78 76  Resp: 19 16  Temp:  36.6 C  SpO2: 98% 98%    Last Pain:  Vitals:   10/24/20 1330  TempSrc:   PainSc: 9                  Nadalee Neiswender E Kaydra Borgen

## 2020-10-24 NOTE — Op Note (Signed)
10/24/2020  12:41 PM  PATIENT:  Mike Floyd    PRE-OPERATIVE DIAGNOSIS:  Achilles Contracture and Foot Ulcer first metatarsal head secondary to deformity first metatarsal  POST-OPERATIVE DIAGNOSIS:  Same  PROCEDURE:  LEFT GASTROCNEMIUS RECESSION, DORSIFLEXION OSTEOTOMY 1ST MT at the base  SURGEON:  [name], MD  PHYSICIAN ASSISTANT:None ANESTHESIA:   General  PREOPERATIVE INDICATIONS:  Mike Floyd is a  60 y.o. male with a diagnosis of Achilles Contracture and Foot Ulcer who failed conservative measures and elected for surgical management.    The risks benefits and alternatives were discussed with the patient preoperatively including but not limited to the risks of infection, bleeding, nerve injury, cardiopulmonary complications, the need for revision surgery, among others, and the patient was willing to proceed.  OPERATIVE IMPLANTS: Staple 20 x 17 mm  @ENCIMAGES @  OPERATIVE FINDINGS: Good improvement in dorsiflexion calluses pared on the plantar aspect of the foot x2 and a ulcer debrided on the second toe.  OPERATIVE PROCEDURE: Patient was brought the operating room underwent a general anesthetic.  After adequate levels anesthesia were obtained patient's left lower extremity was prepped using DuraPrep draped into a sterile field a timeout was called.  Attention was first focused on the gastrocnemius recession.  A longitudinal medial incision was made centered 15 cm proximal to the end of the medial malleolus.  Blunt dissection was carried down to the gastrocnemius fascia with dorsiflexion pressure on the foot the fascia was released under direct visualization.  This took his dorsiflexion from 20 degrees short of neutral to 20 degrees past neutral.  The wound was irrigated with normal saline incision closed using 2-0 nylon.  A separate incision was then made dorsally over the base of the first metatarsal blunt dissection was carried down to bone subperiosteal dissection was  performed to cleanse the base of the first metatarsal.  Hohmann retractors were placed a dorsal wedge was taken from the base of the first metatarsal to allow for closing osteotomy to unload pressure from the first metatarsal head that had developed ulcer beneath the forefoot.  The closing osteotomy was performed a staple was placed to secure the osteotomy this was stable.  The wound was irrigated with normal saline and incision closed using 2-0 nylon.  The callus beneath the first metatarsal head and fifth metatarsal head was pared with a 21 blade knife and patient states that he just nicked his second toe with trying to trim his nails the callus was pared this ulcer was decompressed and will plan for oral antibiotics after discharge.  Sterile dressing was applied patient was extubated taken the PACU in stable condition.   DISCHARGE PLANNING:  Antibiotic duration: Preoperative antibiotics followed by 4 weeks of oral doxycycline  Weightbearing: Nonweightbearing  Pain medication: Prescription for Percocet  Dressing care/ Wound VAC: Dry dressing  Ambulatory devices: Crutches and a cam boot walker  Discharge to: Home.  Follow-up: In the office 1 week post operative.

## 2020-10-24 NOTE — Interval H&P Note (Signed)
History and Physical Interval Note:  10/24/2020 9:50 AM  Mike Floyd  has presented today for surgery, with the diagnosis of Achilles Contracture and Foot Ulcer.  The various methods of treatment have been discussed with the patient and family. After consideration of risks, benefits and other options for treatment, the patient has consented to  Procedure(s): LEFT GASTROCNEMIUS RECESSION, DORSIFLEXION OSTEOTOMY 1ST MT (Left) as a surgical intervention.  The patient's history has been reviewed, patient examined, no change in status, stable for surgery.  I have reviewed the patient's chart and labs.  Questions were answered to the patient's satisfaction.     [name]

## 2020-10-24 NOTE — H&P (Signed)
Mike Floyd is an 60 y.o. male.   Chief Complaint: Cavovarus foot deformity left foot [id number] is a 60 year old gentleman who was seen for initial evaluation for ulcerations bilateral feet.  Patient states he has been going to podiatry with callus trimming.  He states this treatment is been ongoing for about 5 months.  He states he still has ulcers beneath the metatarsal heads and has been using Silvadene dressing changes he ambulates with a cane.  Patient does have extra-depth shoes with multilayer orthotics.  Patient states that his sugars run in the 200s he states that he takes insulin 50 to 80 units at night.  Past Medical History:  Diagnosis Date  . AICD (automatic cardioverter/defibrillator) present 11/2007   a. 11/2007 SJM Current VR - single lead ICD  - Removed 2018 - "it was burning me"  . Anxiety   . CAD (coronary artery disease)    non-obstructive CAD by Cor CT in 2020 // Myoview 3/22: EF 57, small inf-sept defect c/w scar, no ischemia; low risk     . Chest pain    a. 10/2007 Cath:  normal Cors.  . CKD (chronic kidney disease), stage II   . DDD (degenerative disc disease), lumbar   . Diabetes mellitus DX: 2010  . Erosive esophagitis    a. per EGD (08/2011), Dr. Laural Golden - Erosive reflux esophagitis improved but not completely healed since previous EGD 3 years ago. Bx showing  ulcerated gatroesophageal junction mucosa. negative for H. pylori  . GERD (gastroesophageal reflux disease)   . Gout   . Hearing deficit    a. wear bilateral hearing aides  . History of hiatal hernia   . Hypertension   . Myocardial infarction ([place]) 2011  . Neuropathy    Feet and legs  . Nonischemic dilated cardiomyopathy ([place])    a. H/O EF as low as 35-40% by LV gram 10/2007;  b. Echo 02/2011 EF 50-55%, inf HK, Gr 1 DD   . Renal insufficiency   . Sleep apnea    pt doesnt use, states"I cant afford one". PCP aware  . Stroke ([place])    mini-stroke in 2014  . TIA (transient ischemic attack)    July, 2013   . Tobacco abuse, in remission 06/27/2009   Discontinued in 2009    . Wears dentures    top plate  . WPW (Wolff-Parkinson-White syndrome)    a. s/p RFCA @ [facility]    Past Surgical History:  Procedure Laterality Date  . APPENDECTOMY    . BACK SURGERY     1995  . CARDIAC CATHETERIZATION     2009  . CARDIAC DEFIBRILLATOR PLACEMENT    . CHOLECYSTECTOMY    . CHOLECYSTECTOMY, LAPAROSCOPIC     11/2007  . COLONOSCOPY W/ POLYPECTOMY  2009  . ELBOW SURGERY Left 06/2010  . ESOPHAGEAL DILATION N/A 11/08/2014   Procedure: ESOPHAGEAL DILATION;  Surgeon: Rogene Houston, MD;  Location: [facility];  Service: Endoscopy;  Laterality: N/A;  #56,   . ESOPHAGOGASTRODUODENOSCOPY  03/31/2012   also 08/2011; [name]  . ESOPHAGOGASTRODUODENOSCOPY (EGD) WITH PROPOFOL N/A 11/08/2014   Procedure: ESOPHAGOGASTRODUODENOSCOPY (EGD) WITH PROPOFOL;  Surgeon: Rogene Houston, MD;  Location: [facility];  Service: Endoscopy;  Laterality: N/A;  Hiatus is 39 , GE Junction is 37  . ICD LEAD REMOVAL N/A 10/07/2016   Procedure: ICD LEAD REMOVAL ;  Surgeon: Evans Lance, MD;  Location: [facility];  Service: Cardiovascular;  Laterality: N/A;  . MULTIPLE TOOTH EXTRACTIONS    .  [place]; performed at [facility] N/A 10/07/2016   Procedure: TRANSESOPHAGEAL ECHOCARDIOGRAM (TEE);  Surgeon: Evans Lance, MD;  Location: [facility];  Service: Cardiovascular;  Laterality: N/A;    Family History  Problem Relation Age of Onset  . Heart attack Mother 66  . Hypertension Mother   . Diabetes Mother   . Kidney disease Mother   . Breast cancer Mother   . Heart attack Father 49  . Hypertension Father   . Diabetes Father   . Stroke Brother   . Heart attack Brother 51  . Stroke Maternal Grandmother 80  . Diabetes Brother   . Hypertension Brother    Social History:  reports that he quit smoking about 10 years ago. His smoking use included cigarettes. He started smoking about 49 years ago.  He has a 52.50 pack-year smoking history. He quit smokeless tobacco use about 26 years ago.  His smokeless tobacco use included chew. He reports that he does not drink alcohol and does not use drugs.  Allergies: No Known Allergies  No medications prior to admission.    Results for orders placed or performed during the hospital encounter of 10/23/20 (from the past 48 hour(s))  SARS CORONAVIRUS 2 (TAT 6-24 HRS) Nasopharyngeal Nasopharyngeal Swab     Status: None   Collection Time: 10/23/20  1:16 PM   Specimen: Nasopharyngeal Swab  Result Value Ref Range   SARS Coronavirus 2 NEGATIVE NEGATIVE    Comment: (NOTE) SARS-CoV-2 target nucleic acids are NOT DETECTED.  The SARS-CoV-2 RNA is generally detectable in upper and lower respiratory specimens during the acute phase of infection. Negative results do not preclude SARS-CoV-2 infection, do not rule out co-infections with other pathogens, and should not be used as the sole basis for treatment or other patient management decisions. Negative results must be combined with clinical observations, patient history, and epidemiological information. The expected result is Negative.  Fact Sheet for Patients: SugarRoll.be  Fact Sheet for Healthcare Providers: https://www.woods-mathews.com/  This test is not yet approved or cleared by the [place] FDA and  has been authorized for detection and/or diagnosis of SARS-CoV-2 by FDA under an Emergency Use Authorization (EUA). This EUA will remain  in effect (meaning this test can be used) for the duration of the COVID-19 declaration under Se ction 564(b)(1) of the Act, 21 U.S.C. section 360bbb-3(b)(1), unless the authorization is terminated or revoked sooner.  Performed at [facility], [place] [address], [place], [place] [address]    No results found.  Review of Systems  All other systems reviewed and are negative.   There were no vitals taken  for this visit. Physical Exam  Patient is alert, oriented, no adenopathy, well-dressed, normal affect, normal respiratory effort. Examination patient ambulates with a cane he has palpable pulses in both feet and has triphasic pulses bilaterally better in the posterior tibial then dorsalis pedis.  Patient has extreme Achilles contracture bilaterally with his knee extended he has dorsiflexion 20 degrees short of neutral.  He has a cavovarus foot with a plantarflexed first ray.  With the plantarflexed first ray and Achilles contracture he has developed a Wagner grade 1 ulcers beneath the first metatarsal heads bilaterally.  There is no cellulitis no drainage no exposed bone or tendon no signs of any deep infection.Lungs Clear heart RRR Assessment/Plan 1. Achilles tendon contracture, bilateral   2. Cavovarus deformity of foot, acquired, unspecified laterality   3. Non-pressure  chronic ulcer of other part of right foot limited to breakdown of skin ([place])     Plan: Recommended improvement in the glucose control.  Discussed that we could consider surgical intervention which would include a gastrocnemius recession to unload the forefoot as well as a dorsiflexion osteotomy to the first metatarsal to further unload the forefoot.  Recommended proceeding with surgery on 1 foot.  We will call to set this up at his convenience risk and benefits were discussed including infection nonhealing the wounds need for additional surgery.   Bevely Palmer Persons, PA 10/24/2020, 6:41 AM

## 2020-10-24 NOTE — Transfer of Care (Signed)
Immediate Anesthesia Transfer of Care Note  Patient: Mike Floyd  Procedure(s) Performed: LEFT GASTROCNEMIUS RECESSION, DORSIFLEXION OSTEOTOMY 1ST MT (Left Foot)  Patient Location: PACU  Anesthesia Type:General  Level of Consciousness: drowsy and patient cooperative  Airway & Oxygen Therapy: Patient Spontanous Breathing  Post-op Assessment: Report given to RN, Post -op Vital signs reviewed and stable and Patient moving all extremities X 4  Post vital signs: Reviewed and stable  Last Vitals:  Vitals Value Taken Time  BP 99/68 10/24/20 1246  Temp    Pulse 89 10/24/20 1249  Resp 15 10/24/20 1249  SpO2 97 % 10/24/20 1249  Vitals shown include unvalidated device data.  Last Pain:  Vitals:   10/24/20 1245  TempSrc:   PainSc: (P) 0-No pain         Complications: No complications documented.

## 2020-10-24 NOTE — Anesthesia Procedure Notes (Signed)
Procedure Name: LMA Insertion Date/Time: 10/24/2020 11:57 AM Performed by: Darletta Moll, CRNA Pre-anesthesia Checklist: Patient identified, Emergency Drugs available, Suction available and Patient being monitored Patient Re-evaluated:Patient Re-evaluated prior to induction Oxygen Delivery Method: Circle system utilized Preoxygenation: Pre-oxygenation with 100% oxygen Induction Type: IV induction Ventilation: Mask ventilation without difficulty LMA: LMA inserted LMA Size: 5.0 Number of attempts: 1 Placement Confirmation: positive ETCO2,  breath sounds checked- equal and bilateral and CO2 detector Tube secured with: Tape Dental Injury: Teeth and Oropharynx as per pre-operative assessment

## 2020-10-27 ENCOUNTER — Encounter (HOSPITAL_COMMUNITY): Payer: Self-pay | Admitting: Orthopedic Surgery

## 2020-10-28 ENCOUNTER — Ambulatory Visit (INDEPENDENT_AMBULATORY_CARE_PROVIDER_SITE_OTHER): Payer: Medicare Other | Admitting: Physician Assistant

## 2020-10-28 ENCOUNTER — Encounter: Payer: Self-pay | Admitting: Orthopedic Surgery

## 2020-10-28 DIAGNOSIS — M21962 Unspecified acquired deformity of left lower leg: Secondary | ICD-10-CM

## 2020-10-28 MED ORDER — SILVER SULFADIAZINE 1 % EX CREA: 1.0000 "application " | TOPICAL_CREAM | Freq: Every day | CUTANEOUS | 0 refills | Status: DC

## 2020-10-28 NOTE — Addendum Note (Signed)
Addended by: Georgette Dover on: 10/28/2020 04:22 PM   Modules accepted: Orders

## 2020-10-28 NOTE — Progress Notes (Signed)
Office Visit Note   Patient: Mike Floyd           Date of Birth: 1960/11/21           MRN: [id number] Visit Date: 10/28/2020              Requested by: [facility], [place],  [place] [address] PCP: [facility], MD  Chief Complaint  Patient presents with  . Left Leg - Routine Post Op    10/24/20 left gastroc recession dorsiflexion osteotomy 1st MT   . Left Foot - Routine Post Op      HPI: Patient is a pleasant 60 year old gentleman who is 4 days status post left gastrocnemius recession dorsiflexion osteotomy of the first metatarsal.  He is in a fracture boot nonweightbearing.  He has no complaints  Assessment & Plan: Visit Diagnoses: No diagnosis found.  Plan: May begin daily cleansing with Dial antibacterial soap and water.  Should apply clean dry dressing daily.  He will continue on the doxycycline for a total of 4 weeks.  I discussed with his family they would be appropriate to get him some probiotics considering the length of treatment.  They will follow-up in 1 week at which time x-rays of his foot should be taken  Follow-Up Instructions: No follow-ups on file.   Ortho Exam  Patient is alert, oriented, no adenopathy, well-dressed, normal affect, normal respiratory effort.  Examination of his foot demonstrates well approximated incisions both in the calf and on the dorsum of the foot.  Swelling is well controlled toes are warm and pink with brisk capillary refill.  He does have a plantar forefoot ulcer.  There is no surrounding erythema no cellulitis.  No ascending cellulitis from his foot.  Imaging: No results found. No images are attached to the encounter.  Labs: Lab Results  Component Value Date   HGBA1C 7.0 (H) 06/15/2017   HGBA1C 5.2 03/15/2013   HGBA1C 6.5 (H) 03/07/2012   CRP 2.2 06/27/2019   LABURIC 5.5 03/07/2012   REPTSTATUS 02/21/2008 FINAL 02/19/2008   CULT  02/19/2008    Multiple bacterial morphotypes present, none  predominant. Suggest appropriate recollection if clinically indicated.     Lab Results  Component Value Date   ALBUMIN 4.6 12/27/2013   ALBUMIN 4.4 05/30/2013   ALBUMIN 4.2 03/16/2013    Lab Results  Component Value Date   MG 1.7 09/05/2012   MG 1.9 06/30/2009   MG 1.9 06/30/2009   Lab Results  Component Value Date   VD25OH 18 (L) 06/15/2017    No results found for: PREALBUMIN CBC EXTENDED Latest Ref Rng & Units 10/24/2020 06/27/2019 10/06/2016  WBC 4.0 - 10.5 K/uL 6.4 5.5 6.5  RBC 4.22 - 5.81 MIL/uL 5.03 5.64 4.87  HGB 13.0 - 17.0 g/dL 15.3 17.0 15.4  HCT 39.0 - 52.0 % 42.4 49.2 43.7  PLT 150 - 400 K/uL 147(L) 137(L) 121(L)  NEUTROABS 1,500 - 7,800 cells/uL - 3,641 -  LYMPHSABS 850 - 3,900 cells/uL - 1,276 -     There is no height or weight on file to calculate BMI.  Orders:  No orders of the defined types were placed in this encounter.  No orders of the defined types were placed in this encounter.    Procedures: No procedures performed  Clinical Data: No additional findings.  ROS:  All other systems negative, except as noted in the HPI. Review of Systems  Objective: Vital Signs: There were no vitals taken for  this visit.  Specialty Comments:  No specialty comments available.  PMFS History: Patient Active Problem List   Diagnosis Date Noted  . Contracture of left Achilles tendon   . Metatarsal deformity, left   . CAD (coronary artery disease)   . Diarrhea 06/27/2019  . Diastasis recti 08/22/2018  . Umbilical hernia without obstruction and without gangrene 08/22/2018  . Vitamin D deficiency 06/23/2017  . Essential hypertension, benign 06/15/2017  . Mixed hyperlipidemia 06/15/2017  . Class 1 obesity due to excess calories with serious comorbidity and body mass index (BMI) of 31.0 to 31.9 in adult 06/15/2017  . Malfunction of implantable cardioverter-defibrillator (ICD) electrode 10/07/2016  . Chronic kidney disease (CKD), stage III (moderate)  ([place]) 04/04/2015  . Bladder neck obstruction 03/21/2015  . Difficult or painful urination 03/21/2015  . Flank pain 03/21/2015  . Delayed onset of urination 03/21/2015  . Calculus of kidney 03/21/2015  . Erosive esophagitis 12/27/2013  . Hypotension due to drugs 07/02/2013  . Chronic systolic heart failure ([place]) 04/24/2013  . CAP (community acquired pneumonia) 03/16/2013  . HTN (hypertension) 03/15/2013  . Hydrocele 03/15/2013  . Spermatocele 03/15/2013  . DOE (dyspnea on exertion) 03/15/2013  . OSA (obstructive sleep apnea) 01/09/2013  . Chronic kidney disease, stage 3 ([place]) 01/03/2013  . Chest pain 12/26/2012  . History of diagnostic tests 09/06/2012  . TIA (transient ischemic attack)   . Cardiomyopathy, nonischemic ([place]) 02/24/2010  . AICD (automatic cardioverter/defibrillator) 02/24/2010  . DM type 2 causing vascular disease ([place]) 06/27/2009  . Gout 06/27/2009  . Tobacco abuse, in remission 06/27/2009  . COLONIC POLYPS 10/31/2008  . GERD (gastroesophageal reflux disease) 10/31/2008   Past Medical History:  Diagnosis Date  . AICD (automatic cardioverter/defibrillator) present 11/2007   a. 11/2007 SJM Current VR - single lead ICD  - Removed 2018 - "it was burning me"  . Anxiety   . CAD (coronary artery disease)    non-obstructive CAD by Cor CT in 2020 // Myoview 3/22: EF 57, small inf-sept defect c/w scar, no ischemia; low risk     . Chest pain    a. 10/2007 Cath:  normal Cors.  . CKD (chronic kidney disease), stage II   . DDD (degenerative disc disease), lumbar   . Diabetes mellitus DX: 2010  . Erosive esophagitis    a. per EGD (08/2011), Dr. Laural Golden - Erosive reflux esophagitis improved but not completely healed since previous EGD 3 years ago. Bx showing  ulcerated gatroesophageal junction mucosa. negative for H. pylori  . GERD (gastroesophageal reflux disease)   . Gout   . Hearing deficit    a. wear bilateral hearing aides  . History of hiatal hernia   . Hypertension    . Myocardial infarction ([place]) 2011  . Neuropathy    Feet and legs  . Nonischemic dilated cardiomyopathy ([place])    a. H/O EF as low as 35-40% by LV gram 10/2007;  b. Echo 02/2011 EF 50-55%, inf HK, Gr 1 DD   . Renal insufficiency   . Sleep apnea    pt doesnt use, states"I cant afford one". PCP aware  . Stroke ([place])    mini-stroke in 2014  . TIA (transient ischemic attack)    July, 2013  . Tobacco abuse, in remission 06/27/2009   Discontinued in 2009    . Wears dentures    top plate  . WPW (Wolff-Parkinson-White syndrome)    a. s/p RFCA @ [facility]    Family History  Problem Relation Age  of Onset  . Heart attack Mother 52  . Hypertension Mother   . Diabetes Mother   . Kidney disease Mother   . Breast cancer Mother   . Heart attack Father 56  . Hypertension Father   . Diabetes Father   . Stroke Brother   . Heart attack Brother 63  . Stroke Maternal Grandmother 80  . Diabetes Brother   . Hypertension Brother     Past Surgical History:  Procedure Laterality Date  . APPENDECTOMY    . BACK SURGERY     1995  . CARDIAC CATHETERIZATION     2009  . CARDIAC DEFIBRILLATOR PLACEMENT    . CHOLECYSTECTOMY    . CHOLECYSTECTOMY, LAPAROSCOPIC     11/2007  . COLONOSCOPY W/ POLYPECTOMY  2009  . ELBOW SURGERY Left 06/2010  . ESOPHAGEAL DILATION N/A 11/08/2014   Procedure: ESOPHAGEAL DILATION;  Surgeon: Rogene Houston, MD;  Location: [facility];  Service: Endoscopy;  Laterality: N/A;  #56,   . ESOPHAGOGASTRODUODENOSCOPY  03/31/2012   also 08/2011; [name]  . ESOPHAGOGASTRODUODENOSCOPY (EGD) WITH PROPOFOL N/A 11/08/2014   Procedure: ESOPHAGOGASTRODUODENOSCOPY (EGD) WITH PROPOFOL;  Surgeon: Rogene Houston, MD;  Location: [facility];  Service: Endoscopy;  Laterality: N/A;  Hiatus is 39 , GE Junction is 37  . FOOT ARTHRODESIS Left 10/24/2020   Procedure: LEFT GASTROCNEMIUS RECESSION, DORSIFLEXION OSTEOTOMY 1ST MT;  Surgeon: [name], MD;  Location: [facility];  Service: Orthopedics;  Laterality:  Left;  . ICD LEAD REMOVAL N/A 10/07/2016   Procedure: ICD LEAD REMOVAL ;  Surgeon: Evans Lance, MD;  Location: [facility];  Service: Cardiovascular;  Laterality: N/A;  . MULTIPLE TOOTH EXTRACTIONS    . [place]; performed at [place] N/A 10/07/2016   Procedure: TRANSESOPHAGEAL ECHOCARDIOGRAM (TEE);  Surgeon: Evans Lance, MD;  Location: [facility] OR;  Service: Cardiovascular;  Laterality: N/A;   Social History   Occupational History  . Occupation: Worked for [employer]: [place] off in 11/11  Tobacco Use  . Smoking status: Former Smoker    Packs/day: 1.50    Years: 35.00    Pack years: 52.50    Types: Cigarettes    Start date: 06/30/1971    Quit date: 07/30/2010    Years since quitting: 10.2  . Smokeless tobacco: Former Systems developer    Types: [place] date: 03/26/1994  Vaping Use  . Vaping Use: Former  . Start date: 07/30/2010  . Quit date: 06/29/2016  Substance and Sexual Activity  . Alcohol use: No    Alcohol/week: 1.0 standard drink    Types: 1 Cans of beer per week    Comment: used "years ago"  . Drug use: No  . Sexual activity: Never    Birth control/protection: None

## 2020-11-04 ENCOUNTER — Encounter: Payer: Self-pay | Admitting: Orthopedic Surgery

## 2020-11-04 ENCOUNTER — Ambulatory Visit (INDEPENDENT_AMBULATORY_CARE_PROVIDER_SITE_OTHER): Payer: Medicare Other | Admitting: Orthopedic Surgery

## 2020-11-04 DIAGNOSIS — L97511 Non-pressure chronic ulcer of other part of right foot limited to breakdown of skin: Secondary | ICD-10-CM

## 2020-11-04 DIAGNOSIS — M6702 Short Achilles tendon (acquired), left ankle: Secondary | ICD-10-CM

## 2020-11-04 DIAGNOSIS — M216X9 Other acquired deformities of unspecified foot: Secondary | ICD-10-CM

## 2020-11-04 DIAGNOSIS — M6701 Short Achilles tendon (acquired), right ankle: Secondary | ICD-10-CM

## 2020-11-04 DIAGNOSIS — M21962 Unspecified acquired deformity of left lower leg: Secondary | ICD-10-CM

## 2020-11-04 NOTE — Progress Notes (Signed)
Office Visit Note   Patient: Mike Floyd           Date of Birth: 23-Mar-1961           MRN: [id number] Visit Date: 11/04/2020              Requested by: Redmond School, [place],  [place] [id number] PCP: Redmond School, MD  No chief complaint on file.     HPI: Patient is a 60 year old gentleman who presents in follow-up status post dorsal closing wedge osteotomy of the first metatarsal as well as a gastrocnemius recession patient feels like he is doing well has no complaints.  Assessment & Plan: Visit Diagnoses:  1. Metatarsal deformity, left   2. Achilles tendon contracture, bilateral   3. Cavovarus deformity of foot, acquired, unspecified laterality   4. Non-pressure chronic ulcer of other part of right foot limited to breakdown of skin ([place])     Plan: Patient will advance to regular sneakers wash the foot and leg with soap and water and apply 4 x 4 gauze and an Ace wrap.  At follow-up remove the sutures.  At follow-up patient will need custom orthotics.  Follow-Up Instructions: Return in about 1 week (around 11/11/2020).   Ortho Exam  Patient is alert, oriented, no adenopathy, well-dressed, normal affect, normal respiratory effort. Examination the ulcer beneath the first metatarsal head has already completely healed.  He also has a ulcer on the tip of the second toe and an ulcer beneath the fifth metatarsal head.  He has good dorsiflexion of the ankle the incisions are well approximated no cellulitis no signs of infection.  After informed consent the callus beneath the fifth metatarsal head second toe and first metatarsal head was pared silver nitrate was used for hemostasis.  The ulcers continue to improve quite nicely.  Imaging: No results found. No images are attached to the encounter.  Labs: Lab Results  Component Value Date   HGBA1C 7.0 (H) 06/15/2017   HGBA1C 5.2 03/15/2013   HGBA1C 6.5 (H) 03/07/2012   CRP 2.2 06/27/2019   LABURIC  5.5 03/07/2012   REPTSTATUS 02/21/2008 FINAL 02/19/2008   CULT  02/19/2008    Multiple bacterial morphotypes present, none predominant. Suggest appropriate recollection if clinically indicated.     Lab Results  Component Value Date   ALBUMIN 4.6 12/27/2013   ALBUMIN 4.4 05/30/2013   ALBUMIN 4.2 03/16/2013    Lab Results  Component Value Date   MG 1.7 09/05/2012   MG 1.9 06/30/2009   MG 1.9 06/30/2009   Lab Results  Component Value Date   VD25OH 18 (L) 06/15/2017    No results found for: PREALBUMIN CBC EXTENDED Latest Ref Rng & Units 10/24/2020 06/27/2019 10/06/2016  WBC 4.0 - 10.5 K/uL 6.4 5.5 6.5  RBC 4.22 - 5.81 MIL/uL 5.03 5.64 4.87  HGB 13.0 - 17.0 g/dL 15.3 17.0 15.4  HCT 39.0 - 52.0 % 42.4 49.2 43.7  PLT 150 - 400 K/uL 147(L) 137(L) 121(L)  NEUTROABS 1,500 - 7,800 cells/uL - 3,641 -  LYMPHSABS 850 - 3,900 cells/uL - 1,276 -     There is no height or weight on file to calculate BMI.  Orders:  No orders of the defined types were placed in this encounter.  No orders of the defined types were placed in this encounter.    Procedures: No procedures performed  Clinical Data: No additional findings.  ROS:  All other systems negative, except as noted in the  HPI. Review of Systems  Objective: Vital Signs: There were no vitals taken for this visit.  Specialty Comments:  No specialty comments available.  PMFS History: Patient Active Problem List   Diagnosis Date Noted  . Contracture of left Achilles tendon   . Metatarsal deformity, left   . CAD (coronary artery disease)   . Diarrhea 06/27/2019  . Diastasis recti 08/22/2018  . Umbilical hernia without obstruction and without gangrene 08/22/2018  . Vitamin D deficiency 06/23/2017  . Essential hypertension, benign 06/15/2017  . Mixed hyperlipidemia 06/15/2017  . Class 1 obesity due to excess calories with serious comorbidity and body mass index (BMI) of 31.0 to 31.9 in adult 06/15/2017  . Malfunction of  implantable cardioverter-defibrillator (ICD) electrode 10/07/2016  . Chronic kidney disease (CKD), stage III (moderate) ([place]) 04/04/2015  . Bladder neck obstruction 03/21/2015  . Difficult or painful urination 03/21/2015  . Flank pain 03/21/2015  . Delayed onset of urination 03/21/2015  . Calculus of kidney 03/21/2015  . Erosive esophagitis 12/27/2013  . Hypotension due to drugs 07/02/2013  . Chronic systolic heart failure ([place]) 04/24/2013  . CAP (community acquired pneumonia) 03/16/2013  . HTN (hypertension) 03/15/2013  . Hydrocele 03/15/2013  . Spermatocele 03/15/2013  . DOE (dyspnea on exertion) 03/15/2013  . OSA (obstructive sleep apnea) 01/09/2013  . Chronic kidney disease, stage 3 ([place]) 01/03/2013  . Chest pain 12/26/2012  . History of diagnostic tests 09/06/2012  . TIA (transient ischemic attack)   . Cardiomyopathy, nonischemic ([place]) 02/24/2010  . AICD (automatic cardioverter/defibrillator) 02/24/2010  . DM type 2 causing vascular disease ([place]) 06/27/2009  . Gout 06/27/2009  . Tobacco abuse, in remission 06/27/2009  . COLONIC POLYPS 10/31/2008  . GERD (gastroesophageal reflux disease) 10/31/2008   Past Medical History:  Diagnosis Date  . AICD (automatic cardioverter/defibrillator) present 11/2007   a. 11/2007 SJM Current VR - single lead ICD  - Removed 2018 - "it was burning me"  . Anxiety   . CAD (coronary artery disease)    non-obstructive CAD by Cor CT in 2020 // Myoview 3/22: EF 57, small inf-sept defect c/w scar, no ischemia; low risk     . Chest pain    a. 10/2007 Cath:  normal Cors.  . CKD (chronic kidney disease), stage II   . DDD (degenerative disc disease), lumbar   . Diabetes mellitus DX: 2010  . Erosive esophagitis    a. per EGD (08/2011), Dr. Laural Golden - Erosive reflux esophagitis improved but not completely healed since previous EGD 3 years ago. Bx showing  ulcerated gatroesophageal junction mucosa. negative for H. pylori  . GERD (gastroesophageal reflux  disease)   . Gout   . Hearing deficit    a. wear bilateral hearing aides  . History of hiatal hernia   . Hypertension   . Myocardial infarction ([place]) 2011  . Neuropathy    Feet and legs  . Nonischemic dilated cardiomyopathy ([place])    a. H/O EF as low as 35-40% by LV gram 10/2007;  b. Echo 02/2011 EF 50-55%, inf HK, Gr 1 DD   . Renal insufficiency   . Sleep apnea    pt doesnt use, states"I cant afford one". PCP aware  . Stroke ([place])    mini-stroke in 2014  . TIA (transient ischemic attack)    July, 2013  . Tobacco abuse, in remission 06/27/2009   Discontinued in 2009    . Wears dentures    top plate  . WPW (Wolff-Parkinson-White syndrome)    a. s/p  RFCA @ [place]    Family History  Problem Relation Age of Onset  . Heart attack Mother 41  . Hypertension Mother   . Diabetes Mother   . Kidney disease Mother   . Breast cancer Mother   . Heart attack Father 42  . Hypertension Father   . Diabetes Father   . Stroke Brother   . Heart attack Brother 95  . Stroke Maternal Grandmother 80  . Diabetes Brother   . Hypertension Brother     Past Surgical History:  Procedure Laterality Date  . APPENDECTOMY    . BACK SURGERY     1995  . CARDIAC CATHETERIZATION     2009  . CARDIAC DEFIBRILLATOR PLACEMENT    . CHOLECYSTECTOMY    . CHOLECYSTECTOMY, LAPAROSCOPIC     11/2007  . COLONOSCOPY W/ POLYPECTOMY  2009  . ELBOW SURGERY Left 06/2010  . ESOPHAGEAL DILATION N/A 11/08/2014   Procedure: ESOPHAGEAL DILATION;  Surgeon: Rogene Houston, MD;  Location: [facility];  Service: Endoscopy;  Laterality: N/A;  #56,   . ESOPHAGOGASTRODUODENOSCOPY  03/31/2012   also 08/2011; [name]  . ESOPHAGOGASTRODUODENOSCOPY (EGD) WITH PROPOFOL N/A 11/08/2014   Procedure: ESOPHAGOGASTRODUODENOSCOPY (EGD) WITH PROPOFOL;  Surgeon: Rogene Houston, MD;  Location: [facility];  Service: Endoscopy;  Laterality: N/A;  Hiatus is 39 , GE Junction is 37  . FOOT ARTHRODESIS Left 10/24/2020   Procedure: LEFT GASTROCNEMIUS  RECESSION, DORSIFLEXION OSTEOTOMY 1ST MT;  Surgeon: [name], MD;  Location: [facility];  Service: Orthopedics;  Laterality: Left;  . ICD LEAD REMOVAL N/A 10/07/2016   Procedure: ICD LEAD REMOVAL ;  Surgeon: Evans Lance, MD;  Location: [facility];  Service: Cardiovascular;  Laterality: N/A;  . MULTIPLE TOOTH EXTRACTIONS    . [place]; performed at [facility] N/A 10/07/2016   Procedure: TRANSESOPHAGEAL ECHOCARDIOGRAM (TEE);  Surgeon: Evans Lance, MD;  Location: [facility];  Service: Cardiovascular;  Laterality: N/A;   Social History   Occupational History  . Occupation: Worked for [employer]: [place] off in 11/11  Tobacco Use  . Smoking status: Former Smoker    Packs/day: 1.50    Years: 35.00    Pack years: 52.50    Types: Cigarettes    Start date: 06/30/1971    Quit date: 07/30/2010    Years since quitting: 10.2  . Smokeless tobacco: Former Systems developer    Types: [place] date: 03/26/1994  Vaping Use  . Vaping Use: Former  . Start date: 07/30/2010  . Quit date: 06/29/2016  Substance and Sexual Activity  . Alcohol use: No    Alcohol/week: 1.0 standard drink    Types: 1 Cans of beer per week    Comment: used "years ago"  . Drug use: No  . Sexual activity: Never    Birth control/protection: None

## 2020-11-05 DIAGNOSIS — I10 Essential (primary) hypertension: Secondary | ICD-10-CM | POA: Diagnosis not present

## 2020-11-05 DIAGNOSIS — E119 Type 2 diabetes mellitus without complications: Secondary | ICD-10-CM | POA: Diagnosis not present

## 2020-11-11 ENCOUNTER — Encounter: Payer: Self-pay | Admitting: Orthopedic Surgery

## 2020-11-11 ENCOUNTER — Ambulatory Visit (INDEPENDENT_AMBULATORY_CARE_PROVIDER_SITE_OTHER): Payer: Medicare Other | Admitting: Orthopedic Surgery

## 2020-11-11 ENCOUNTER — Ambulatory Visit (INDEPENDENT_AMBULATORY_CARE_PROVIDER_SITE_OTHER): Payer: Medicare Other

## 2020-11-11 DIAGNOSIS — M21962 Unspecified acquired deformity of left lower leg: Secondary | ICD-10-CM | POA: Diagnosis not present

## 2020-11-11 NOTE — Progress Notes (Signed)
Office Visit Note   Patient: Mike Floyd           Date of Birth: 1960/10/22           MRN: [id number] Visit Date: 11/11/2020              Requested by: Redmond School, [address] [place],  [place] [id number] PCP: Redmond School, MD  Chief Complaint  Patient presents with  . Left Leg - Routine Post Op    10/24/20 left gastroc recession ans dorsiflexion osteotomy 1st MT       HPI: Patient is a 60 year old gentleman who is 3 months status post gastrocnemius recession on the left as well as a dorsiflexion osteotomy of the first metatarsal patient states he feels well has no concerns.  Assessment & Plan: Visit Diagnoses:  1. Metatarsal deformity, left     Plan: Patient will continue with his activities the sutures are harvested continue with his extra-depth shoes and custom orthotics.  Follow-Up Instructions: No follow-ups on file.   Ortho Exam  Patient is alert, oriented, no adenopathy, well-dressed, normal affect, normal respiratory effort. Examination the incisions are well approximated there is some redness around the sutures consistent with irritation the sutures are harvested.  There is no drainage no signs of infection.  The ulcers on the plantar aspect of the left foot are resolving.  Imaging: XR Foot 2 Views Left  Result Date: 11/11/2020 2 view radiographs of the left foot shows stable internal fixation of the closing wedge osteotomy first metatarsal.  No images are attached to the encounter.  Labs: Lab Results  Component Value Date   HGBA1C 7.0 (H) 06/15/2017   HGBA1C 5.2 03/15/2013   HGBA1C 6.5 (H) 03/07/2012   CRP 2.2 06/27/2019   LABURIC 5.5 03/07/2012   REPTSTATUS 02/21/2008 FINAL 02/19/2008   CULT  02/19/2008    Multiple bacterial morphotypes present, none predominant. Suggest appropriate recollection if clinically indicated.     Lab Results  Component Value Date   ALBUMIN 4.6 12/27/2013   ALBUMIN 4.4 05/30/2013   ALBUMIN 4.2  03/16/2013    Lab Results  Component Value Date   MG 1.7 09/05/2012   MG 1.9 06/30/2009   MG 1.9 06/30/2009   Lab Results  Component Value Date   VD25OH 18 (L) 06/15/2017    No results found for: PREALBUMIN CBC EXTENDED Latest Ref Rng & Units 10/24/2020 06/27/2019 10/06/2016  WBC 4.0 - 10.5 K/uL 6.4 5.5 6.5  RBC 4.22 - 5.81 MIL/uL 5.03 5.64 4.87  HGB 13.0 - 17.0 g/dL 15.3 17.0 15.4  HCT 39.0 - 52.0 % 42.4 49.2 43.7  PLT 150 - 400 K/uL 147(L) 137(L) 121(L)  NEUTROABS 1,500 - 7,800 cells/uL - 3,641 -  LYMPHSABS 850 - 3,900 cells/uL - 1,276 -     There is no height or weight on file to calculate BMI.  Orders:  Orders Placed This Encounter  Procedures  . XR Foot 2 Views Left   No orders of the defined types were placed in this encounter.    Procedures: No procedures performed  Clinical Data: No additional findings.  ROS:  All other systems negative, except as noted in the HPI. Review of Systems  Objective: Vital Signs: There were no vitals taken for this visit.  Specialty Comments:  No specialty comments available.  PMFS History: Patient Active Problem List   Diagnosis Date Noted  . Contracture of left Achilles tendon   . Metatarsal deformity, left   .  CAD (coronary artery disease)   . Diarrhea 06/27/2019  . Diastasis recti 08/22/2018  . Umbilical hernia without obstruction and without gangrene 08/22/2018  . Vitamin D deficiency 06/23/2017  . Essential hypertension, benign 06/15/2017  . Mixed hyperlipidemia 06/15/2017  . Class 1 obesity due to excess calories with serious comorbidity and body mass index (BMI) of 31.0 to 31.9 in adult 06/15/2017  . Malfunction of implantable cardioverter-defibrillator (ICD) electrode 10/07/2016  . Chronic kidney disease (CKD), stage III (moderate) ([place]) 04/04/2015  . Bladder neck obstruction 03/21/2015  . Difficult or painful urination 03/21/2015  . Flank pain 03/21/2015  . Delayed onset of urination 03/21/2015  .  Calculus of kidney 03/21/2015  . Erosive esophagitis 12/27/2013  . Hypotension due to drugs 07/02/2013  . Chronic systolic heart failure ([place]) 04/24/2013  . CAP (community acquired pneumonia) 03/16/2013  . HTN (hypertension) 03/15/2013  . Hydrocele 03/15/2013  . Spermatocele 03/15/2013  . DOE (dyspnea on exertion) 03/15/2013  . OSA (obstructive sleep apnea) 01/09/2013  . Chronic kidney disease, stage 3 ([place]) 01/03/2013  . Chest pain 12/26/2012  . History of diagnostic tests 09/06/2012  . TIA (transient ischemic attack)   . Cardiomyopathy, nonischemic ([place]) 02/24/2010  . AICD (automatic cardioverter/defibrillator) 02/24/2010  . DM type 2 causing vascular disease ([place]) 06/27/2009  . Gout 06/27/2009  . Tobacco abuse, in remission 06/27/2009  . COLONIC POLYPS 10/31/2008  . GERD (gastroesophageal reflux disease) 10/31/2008   Past Medical History:  Diagnosis Date  . AICD (automatic cardioverter/defibrillator) present 11/2007   a. 11/2007 SJM Current VR - single lead ICD  - Removed 2018 - "it was burning me"  . Anxiety   . CAD (coronary artery disease)    non-obstructive CAD by Cor CT in 2020 // Myoview 3/22: EF 57, small inf-sept defect c/w scar, no ischemia; low risk     . Chest pain    a. 10/2007 Cath:  normal Cors.  . CKD (chronic kidney disease), stage II   . DDD (degenerative disc disease), lumbar   . Diabetes mellitus DX: 2010  . Erosive esophagitis    a. per EGD (08/2011), Dr. Laural Golden - Erosive reflux esophagitis improved but not completely healed since previous EGD 3 years ago. Bx showing  ulcerated gatroesophageal junction mucosa. negative for H. pylori  . GERD (gastroesophageal reflux disease)   . Gout   . Hearing deficit    a. wear bilateral hearing aides  . History of hiatal hernia   . Hypertension   . Myocardial infarction ([place]) 2011  . Neuropathy    Feet and legs  . Nonischemic dilated cardiomyopathy ([place])    a. H/O EF as low as 35-40% by LV gram 10/2007;  b. Echo  02/2011 EF 50-55%, inf HK, Gr 1 DD   . Renal insufficiency   . Sleep apnea    pt doesnt use, states"I cant afford one". PCP aware  . Stroke ([place])    mini-stroke in 2014  . TIA (transient ischemic attack)    July, 2013  . Tobacco abuse, in remission 06/27/2009   Discontinued in 2009    . Wears dentures    top plate  . WPW (Wolff-Parkinson-White syndrome)    a. s/p RFCA @ [facility]    Family History  Problem Relation Age of Onset  . Heart attack Mother 71  . Hypertension Mother   . Diabetes Mother   . Kidney disease Mother   . Breast cancer Mother   . Heart attack Father 75  .  Hypertension Father   . Diabetes Father   . Stroke Brother   . Heart attack Brother 69  . Stroke Maternal Grandmother 80  . Diabetes Brother   . Hypertension Brother     Past Surgical History:  Procedure Laterality Date  . APPENDECTOMY    . BACK SURGERY     1995  . CARDIAC CATHETERIZATION     2009  . CARDIAC DEFIBRILLATOR PLACEMENT    . CHOLECYSTECTOMY    . CHOLECYSTECTOMY, LAPAROSCOPIC     11/2007  . COLONOSCOPY W/ POLYPECTOMY  2009  . ELBOW SURGERY Left 06/2010  . ESOPHAGEAL DILATION N/A 11/08/2014   Procedure: ESOPHAGEAL DILATION;  Surgeon: Rogene Houston, MD;  [facility];  Service: Endoscopy;  Laterality: N/A;  #56,   . ESOPHAGOGASTRODUODENOSCOPY  03/31/2012   also 08/2011; [name]  . ESOPHAGOGASTRODUODENOSCOPY (EGD) WITH PROPOFOL N/A 11/08/2014   Procedure: ESOPHAGOGASTRODUODENOSCOPY (EGD) WITH PROPOFOL;  Surgeon: Rogene Houston, MD;  Location: [facility];  Service: Endoscopy;  Laterality: N/A;  Hiatus is 39 , GE Junction is 37  . FOOT ARTHRODESIS Left 10/24/2020   Procedure: LEFT GASTROCNEMIUS RECESSION, DORSIFLEXION OSTEOTOMY 1ST MT;  Surgeon: [name], MD;  Location: [facility];  Service: Orthopedics;  Laterality: Left;  . ICD LEAD REMOVAL N/A 10/07/2016   Procedure: ICD LEAD REMOVAL ;  Surgeon: Evans Lance, MD;  Location: [facility];  Service: Cardiovascular;  Laterality: N/A;  .  MULTIPLE TOOTH EXTRACTIONS    . [place]; performed at [place] N/A 10/07/2016   Procedure: TRANSESOPHAGEAL ECHOCARDIOGRAM (TEE);  Surgeon: Evans Lance, MD;  Location: [facility];  Service: Cardiovascular;  Laterality: N/A;   Social History   Occupational History  . Occupation: Worked for [employer]: [place] off in 11/11  Tobacco Use  . Smoking status: Former Smoker    Packs/day: 1.50    Years: 35.00    Pack years: 52.50    Types: Cigarettes    Start date: 06/30/1971    Quit date: 07/30/2010    Years since quitting: 10.2  . Smokeless tobacco: Former Systems developer    Types: [place] date: 03/26/1994  Vaping Use  . Vaping Use: Former  . Start date: 07/30/2010  . Quit date: 06/29/2016  Substance and Sexual Activity  . Alcohol use: No    Alcohol/week: 1.0 standard drink    Types: 1 Cans of beer per week    Comment: used "years ago"  . Drug use: No  . Sexual activity: Never    Birth control/protection: None

## 2020-11-13 DIAGNOSIS — E1129 Type 2 diabetes mellitus with other diabetic kidney complication: Secondary | ICD-10-CM | POA: Diagnosis not present

## 2020-11-13 DIAGNOSIS — E1140 Type 2 diabetes mellitus with diabetic neuropathy, unspecified: Secondary | ICD-10-CM | POA: Diagnosis not present

## 2020-11-13 DIAGNOSIS — R201 Hypoesthesia of skin: Secondary | ICD-10-CM | POA: Diagnosis not present

## 2020-11-13 DIAGNOSIS — B353 Tinea pedis: Secondary | ICD-10-CM | POA: Diagnosis not present

## 2020-11-13 DIAGNOSIS — G894 Chronic pain syndrome: Secondary | ICD-10-CM | POA: Diagnosis not present

## 2020-11-13 DIAGNOSIS — L97502 Non-pressure chronic ulcer of other part of unspecified foot with fat layer exposed: Secondary | ICD-10-CM | POA: Diagnosis not present

## 2020-11-13 DIAGNOSIS — E1149 Type 2 diabetes mellitus with other diabetic neurological complication: Secondary | ICD-10-CM | POA: Diagnosis not present

## 2020-11-13 DIAGNOSIS — L84 Corns and callosities: Secondary | ICD-10-CM | POA: Diagnosis not present

## 2020-11-14 ENCOUNTER — Telehealth (HOSPITAL_COMMUNITY): Payer: Self-pay | Admitting: *Deleted

## 2020-11-14 NOTE — Telephone Encounter (Signed)
Reaching out to patient to offer assistance regarding upcoming cardiac imaging study; pt verbalizes understanding of appt date/time, parking situation and where to check in, and verified current allergies; name and call back number provided for further questions should they arise  Arnice Vanepps RN Navigator Cardiac Imaging [facility] [phone] office [phone] cell  

## 2020-11-17 ENCOUNTER — Other Ambulatory Visit: Payer: Self-pay

## 2020-11-17 ENCOUNTER — Ambulatory Visit (HOSPITAL_COMMUNITY)
Admission: RE | Admit: 2020-11-17 | Discharge: 2020-11-17 | Disposition: A | Discharge status: Home / Self Care | Payer: Medicare Other | Source: Ambulatory Visit | Attending: Physician Assistant | Admitting: Physician Assistant

## 2020-11-17 DIAGNOSIS — I719 Aortic aneurysm of unspecified site, without rupture: Secondary | ICD-10-CM | POA: Diagnosis not present

## 2020-11-17 DIAGNOSIS — I7121 Aneurysm of the ascending aorta, without rupture: Secondary | ICD-10-CM

## 2020-11-17 MED ORDER — GADOBUTROL 1 MMOL/ML IV SOLN
10.0000 mL | Freq: Once | INTRAVENOUS | Status: AC | PRN
  Administered 2020-11-17: 10 mL via INTRAVENOUS

## 2020-11-21 ENCOUNTER — Encounter: Payer: Self-pay | Admitting: Physician Assistant

## 2020-11-21 ENCOUNTER — Telehealth: Payer: Self-pay

## 2020-11-21 DIAGNOSIS — I77810 Thoracic aortic ectasia: Secondary | ICD-10-CM | POA: Insufficient documentation

## 2020-11-21 NOTE — Telephone Encounter (Signed)
-----   Message from Liliane Shi, [place] sent at 11/21/2020  8:44 AM EDT ----- Please call patient. MRI shows low normal ejection fraction.  The area that appeared large on the CT was just mildly dilated on this study (the aortic root).  This can be rechecked with an echocardiogram at some point in the future. PLAN:  -Continue current medications/treatment plan and follow up as scheduled.  Richardson Dopp, PA-C    11/21/2020 8:37 AM

## 2020-11-21 NOTE — Telephone Encounter (Signed)
Patient contacted and verbalized understanding. Patient had no questions or concerns at this time. Patient also confirmed follow up appointment with Dr. Johnsie Cancel 01/2021.

## 2020-11-25 ENCOUNTER — Ambulatory Visit (INDEPENDENT_AMBULATORY_CARE_PROVIDER_SITE_OTHER): Payer: Medicare Other | Admitting: Orthopedic Surgery

## 2020-11-25 DIAGNOSIS — M6701 Short Achilles tendon (acquired), right ankle: Secondary | ICD-10-CM

## 2020-11-25 DIAGNOSIS — M6702 Short Achilles tendon (acquired), left ankle: Secondary | ICD-10-CM

## 2020-11-25 DIAGNOSIS — M216X9 Other acquired deformities of unspecified foot: Secondary | ICD-10-CM

## 2020-11-27 ENCOUNTER — Telehealth: Payer: Self-pay | Admitting: *Deleted

## 2020-11-27 ENCOUNTER — Encounter: Payer: Self-pay | Admitting: Orthopedic Surgery

## 2020-11-27 NOTE — Progress Notes (Signed)
Office Visit Note   Patient: Mike Floyd           Date of Birth: 1961-07-05           MRN: [id number] Visit Date: 11/25/2020              Requested by: Redmond School, [place],  [place] [id number] PCP: Redmond School, MD  Chief Complaint  Patient presents with  . Left Leg - Routine Post Op    10/24/20 left gastroc recession and dorsiflexion osteotomy 1st MT      HPI: Patient is a 60 year old gentleman who is 1 month status post left gastrocnemius recession and a dorsiflexion osteotomy base of the first metatarsal.  Patient states that his foot has done quite well with complete healing of the ulcer.  Patient is concerned that the incision is red.  Assessment & Plan: Visit Diagnoses:  1. Achilles tendon contracture, bilateral   2. Cavovarus deformity of foot, acquired, unspecified laterality     Plan: There is no signs of infection patient is given a new felt donut for the ulcers on the right foot.  Plan to follow-up in 1 week.  Follow-Up Instructions: Return in about 1 week (around 12/02/2020).   Ortho Exam  Patient is alert, oriented, no adenopathy, well-dressed, normal affect, normal respiratory effort. Examination the first metatarsal head ulcer on the left foot has completely healed the surgical incisions are well-healed as well as good dorsiflexion of the ankle.  There is no tenderness around the incisions no signs of infection.  Patient has developed a new large ulcer beneath the first metatarsal head of the right foot.  We will need to offload this with pressure relieving felt donuts anticipate patient will require surgical intervention to offload this foot as well.  Imaging: No results found. No images are attached to the encounter.  Labs: Lab Results  Component Value Date   HGBA1C 7.0 (H) 06/15/2017   HGBA1C 5.2 03/15/2013   HGBA1C 6.5 (H) 03/07/2012   CRP 2.2 06/27/2019   LABURIC 5.5 03/07/2012   REPTSTATUS 02/21/2008 FINAL 02/19/2008    CULT  02/19/2008    Multiple bacterial morphotypes present, none predominant. Suggest appropriate recollection if clinically indicated.     Lab Results  Component Value Date   ALBUMIN 4.6 12/27/2013   ALBUMIN 4.4 05/30/2013   ALBUMIN 4.2 03/16/2013    Lab Results  Component Value Date   MG 1.7 09/05/2012   MG 1.9 06/30/2009   MG 1.9 06/30/2009   Lab Results  Component Value Date   VD25OH 18 (L) 06/15/2017    No results found for: PREALBUMIN CBC EXTENDED Latest Ref Rng & Units 10/24/2020 06/27/2019 10/06/2016  WBC 4.0 - 10.5 K/uL 6.4 5.5 6.5  RBC 4.22 - 5.81 MIL/uL 5.03 5.64 4.87  HGB 13.0 - 17.0 g/dL 15.3 17.0 15.4  HCT 39.0 - 52.0 % 42.4 49.2 43.7  PLT 150 - 400 K/uL 147(L) 137(L) 121(L)  NEUTROABS 1,500 - 7,800 cells/uL - 3,641 -  LYMPHSABS 850 - 3,900 cells/uL - 1,276 -     There is no height or weight on file to calculate BMI.  Orders:  No orders of the defined types were placed in this encounter.  No orders of the defined types were placed in this encounter.    Procedures: No procedures performed  Clinical Data: No additional findings.  ROS:  All other systems negative, except as noted in the HPI. Review of Systems  Objective: Vital  Signs: There were no vitals taken for this visit.  Specialty Comments:  No specialty comments available.  PMFS History: Patient Active Problem List   Diagnosis Date Noted  . Mildly dilatd aortic root ([place])   . Contracture of left Achilles tendon   . Metatarsal deformity, left   . CAD (coronary artery disease)   . Diarrhea 06/27/2019  . Diastasis recti 08/22/2018  . Umbilical hernia without obstruction and without gangrene 08/22/2018  . Vitamin D deficiency 06/23/2017  . Essential hypertension, benign 06/15/2017  . Mixed hyperlipidemia 06/15/2017  . Class 1 obesity due to excess calories with serious comorbidity and body mass index (BMI) of 31.0 to 31.9 in adult 06/15/2017  . Malfunction of implantable  cardioverter-defibrillator (ICD) electrode 10/07/2016  . Chronic kidney disease (CKD), stage III (moderate) ([place]) 04/04/2015  . Bladder neck obstruction 03/21/2015  . Difficult or painful urination 03/21/2015  . Flank pain 03/21/2015  . Delayed onset of urination 03/21/2015  . Calculus of kidney 03/21/2015  . Erosive esophagitis 12/27/2013  . Hypotension due to drugs 07/02/2013  . Chronic systolic heart failure ([place]) 04/24/2013  . CAP (community acquired pneumonia) 03/16/2013  . HTN (hypertension) 03/15/2013  . Hydrocele 03/15/2013  . Spermatocele 03/15/2013  . DOE (dyspnea on exertion) 03/15/2013  . OSA (obstructive sleep apnea) 01/09/2013  . Chronic kidney disease, stage 3 ([place]) 01/03/2013  . Chest pain 12/26/2012  . History of diagnostic tests 09/06/2012  . TIA (transient ischemic attack)   . Cardiomyopathy, nonischemic ([place]) 02/24/2010  . AICD (automatic cardioverter/defibrillator) 02/24/2010  . DM type 2 causing vascular disease ([place]) 06/27/2009  . Gout 06/27/2009  . Tobacco abuse, in remission 06/27/2009  . COLONIC POLYPS 10/31/2008  . GERD (gastroesophageal reflux disease) 10/31/2008   Past Medical History:  Diagnosis Date  . AICD (automatic cardioverter/defibrillator) present 11/2007   a. 11/2007 SJM Current VR - single lead ICD  - Removed 2018 - "it was burning me"  . Anxiety   . CAD (coronary artery disease)    non-obstructive CAD by Cor CT in 2020 // Myoview 3/22: EF 57, small inf-sept defect c/w scar, no ischemia; low risk     . Chest pain    a. 10/2007 Cath:  normal Cors.  . CKD (chronic kidney disease), stage II   . DDD (degenerative disc disease), lumbar   . Diabetes mellitus DX: 2010  . Erosive esophagitis    a. per EGD (08/2011), Dr. Laural Golden - Erosive reflux esophagitis improved but not completely healed since previous EGD 3 years ago. Bx showing  ulcerated gatroesophageal junction mucosa. negative for H. pylori  . GERD (gastroesophageal reflux disease)   .  Gout   . Hearing deficit    a. wear bilateral hearing aides  . History of hiatal hernia   . Hypertension   . Mildly dilatd aortic root ([place])    CMR 4/22: EF 52, no LGE; d/w Dr. Marlowe Kays root 38 mm (mildly dilated)  . Myocardial infarction ([place]) 2011  . Neuropathy    Feet and legs  . Nonischemic dilated cardiomyopathy ([place])    a. H/O EF as low as 35-40% by LV gram 10/2007;  b. Echo 02/2011 EF 50-55%, inf HK, Gr 1 DD // CMR 4/22: EF 52, no LGE; d/w Dr. Marlowe Kays root 38 mm (mildly dilated)   . Renal insufficiency   . Sleep apnea    pt doesnt use, states"I cant afford one". PCP aware  . Stroke ([place])    mini-stroke in 2014  . TIA (transient  ischemic attack)    July, 2013  . Tobacco abuse, in remission 06/27/2009   Discontinued in 2009    . Wears dentures    top plate  . WPW (Wolff-Parkinson-White syndrome)    a. s/p RFCA @ [facility]    Family History  Problem Relation Age of Onset  . Heart attack Mother 67  . Hypertension Mother   . Diabetes Mother   . Kidney disease Mother   . Breast cancer Mother   . Heart attack Father 78  . Hypertension Father   . Diabetes Father   . Stroke Brother   . Heart attack Brother 63  . Stroke Maternal Grandmother 80  . Diabetes Brother   . Hypertension Brother     Past Surgical History:  Procedure Laterality Date  . APPENDECTOMY    . BACK SURGERY     1995  . CARDIAC CATHETERIZATION     2009  . CARDIAC DEFIBRILLATOR PLACEMENT    . CHOLECYSTECTOMY    . CHOLECYSTECTOMY, LAPAROSCOPIC     11/2007  . COLONOSCOPY W/ POLYPECTOMY  2009  . ELBOW SURGERY Left 06/2010  . ESOPHAGEAL DILATION N/A 11/08/2014   Procedure: ESOPHAGEAL DILATION;  Surgeon: Rogene Houston, MD;  Location: [facility];  Service: Endoscopy;  Laterality: N/A;  #56,   . ESOPHAGOGASTRODUODENOSCOPY  03/31/2012   also 08/2011; [name]  . ESOPHAGOGASTRODUODENOSCOPY (EGD) WITH PROPOFOL N/A 11/08/2014   Procedure: ESOPHAGOGASTRODUODENOSCOPY (EGD) WITH PROPOFOL;  Surgeon: Rogene Houston, MD;  Location: [facility];  Service: Endoscopy;  Laterality: N/A;  Hiatus is 39 , GE Junction is 37  . FOOT ARTHRODESIS Left 10/24/2020   Procedure: LEFT GASTROCNEMIUS RECESSION, DORSIFLEXION OSTEOTOMY 1ST MT;  Surgeon: [name], MD;  Location: [facility];  Service: Orthopedics;  Laterality: Left;  . ICD LEAD REMOVAL N/A 10/07/2016   Procedure: ICD LEAD REMOVAL ;  Surgeon: Evans Lance, MD;  Location: [facility];  Service: Cardiovascular;  Laterality: N/A;  . MULTIPLE TOOTH EXTRACTIONS    . [place]; performed at [facility] N/A 10/07/2016   Procedure: TRANSESOPHAGEAL ECHOCARDIOGRAM (TEE);  Surgeon: Evans Lance, MD;  Location: [facility];  Service: Cardiovascular;  Laterality: N/A;   Social History   Occupational History  . Occupation: Worked for [employer]: [place] off in 11/11  Tobacco Use  . Smoking status: Former Smoker    Packs/day: 1.50    Years: 35.00    Pack years: 52.50    Types: Cigarettes    Start date: 06/30/1971    Quit date: 07/30/2010    Years since quitting: 10.3  . Smokeless tobacco: Former Systems developer    Types: [place] date: 03/26/1994  Vaping Use  . Vaping Use: Former  . Start date: 07/30/2010  . Quit date: 06/29/2016  Substance and Sexual Activity  . Alcohol use: No    Alcohol/week: 1.0 standard drink    Types: 1 Cans of beer per week    Comment: used "years ago"  . Drug use: No  . Sexual activity: Never    Birth control/protection: None

## 2020-11-27 NOTE — Telephone Encounter (Signed)
Refill request for Potassium Chloride 20 Meq was filled on 10/13/2020 with refills by Bernerd Pho, PA

## 2020-12-02 ENCOUNTER — Ambulatory Visit (INDEPENDENT_AMBULATORY_CARE_PROVIDER_SITE_OTHER): Payer: Medicare Other | Admitting: Orthopedic Surgery

## 2020-12-02 DIAGNOSIS — M6702 Short Achilles tendon (acquired), left ankle: Secondary | ICD-10-CM

## 2020-12-02 DIAGNOSIS — M21961 Unspecified acquired deformity of right lower leg: Secondary | ICD-10-CM

## 2020-12-02 DIAGNOSIS — M216X9 Other acquired deformities of unspecified foot: Secondary | ICD-10-CM

## 2020-12-02 DIAGNOSIS — M6701 Short Achilles tendon (acquired), right ankle: Secondary | ICD-10-CM

## 2020-12-03 ENCOUNTER — Encounter: Payer: Self-pay | Admitting: Orthopedic Surgery

## 2020-12-03 NOTE — Progress Notes (Signed)
Office Visit Note   Patient: Mike Floyd           Date of Birth: 1961/01/17           MRN: [id number] Visit Date: 12/02/2020              Requested by: Redmond School, [place],  [place] [id number] PCP: Redmond School, MD  Chief Complaint  Patient presents with  . Left Leg - Routine Post Op    10/24/20 left gastroc recession and dorsiflexion osteotomy 1 MT       HPI: Patient is a 60 year old gentleman who presents for 2 separate issues.  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  Patient states the ulcer is completely healed and he is quite pleased with his progress.  #2 patient complains of a chronic ulcer beneath the right first metatarsal head due to the plantar flexion deformity of the first ray and the Achilles contracture.  Assessment & Plan: Visit Diagnoses:  1. Achilles tendon contracture, bilateral   2. Cavovarus deformity of foot, acquired, unspecified laterality   3. Metatarsal deformity, right     Plan: Patient states he would like to proceed with surgical intervention on the right.  Plan for a dorsal closing wedge osteotomy of the base of the first metatarsal right foot as well as a gastrocnemius recession on the right risk and benefits were discussed including risk of infection.  Patient states he understands wished to proceed at this time.  Follow-Up Instructions: Return in about 1 week (around 12/09/2020) for Plan to follow-up 1 week postoperatively.[name] Exam  Patient is alert, oriented, no adenopathy, well-dressed, normal affect, normal respiratory effort. Examination of the left first metatarsal head ulcer is completely healed the surgical incision has healed he has good dorsiflexion of the ankle.  Damage the right foot he has a chronic Wagner grade 1 ulcer beneath the first metatarsal head with a cavovarus  foot plantarflexed first ray and Achilles contracture with dorsiflexion to neutral.  Imaging: No results found. No images are attached to the encounter.  Labs: Lab Results  Component Value Date   HGBA1C 7.0 (H) 06/15/2017   HGBA1C 5.2 03/15/2013   HGBA1C 6.5 (H) 03/07/2012   CRP 2.2 06/27/2019   LABURIC 5.5 03/07/2012   REPTSTATUS 02/21/2008 FINAL 02/19/2008   CULT  02/19/2008    Multiple bacterial morphotypes present, none predominant. Suggest appropriate recollection if clinically indicated.     Lab Results  Component Value Date   ALBUMIN 4.6 12/27/2013   ALBUMIN 4.4 05/30/2013   ALBUMIN 4.2 03/16/2013    Lab Results  Component Value Date   MG 1.7 09/05/2012   MG 1.9 06/30/2009   MG 1.9 06/30/2009   Lab Results  Component Value Date   VD25OH 18 (L) 06/15/2017    No results found for: PREALBUMIN CBC EXTENDED Latest Ref Rng & Units 10/24/2020 06/27/2019 10/06/2016  WBC 4.0 - 10.5 K/uL 6.4 5.5 6.5  RBC 4.22 - 5.81 MIL/uL 5.03 5.64 4.87  HGB 13.0 - 17.0 g/dL 15.3 17.0 15.4  HCT 39.0 - 52.0 % 42.4 49.2 43.7  PLT 150 - 400 K/uL 147(L) 137(L) 121(L)  NEUTROABS 1,500 - 7,800 cells/uL - 3,641 -  LYMPHSABS 850 - 3,900 cells/uL - 1,276 -     There is no height or weight on file to calculate BMI.  Orders:  No orders of the defined types were placed in this encounter.  No orders of the defined types were placed in this encounter.    Procedures: No procedures performed  Clinical Data: No additional findings.  ROS:  All other systems negative, except as noted in the HPI. Review of Systems  Objective: Vital Signs: There were no vitals taken for this visit.  Specialty Comments:  No specialty comments available.  PMFS History: Patient Active Problem List   Diagnosis Date Noted  . Mildly dilatd aortic root ([place])   . Contracture of left Achilles tendon   . Metatarsal deformity, left   . CAD (coronary artery disease)   . Diarrhea 06/27/2019  . Diastasis  recti 08/22/2018  . Umbilical hernia without obstruction and without gangrene 08/22/2018  . Vitamin D deficiency 06/23/2017  . Essential hypertension, benign 06/15/2017  . Mixed hyperlipidemia 06/15/2017  . Class 1 obesity due to excess calories with serious comorbidity and body mass index (BMI) of 31.0 to 31.9 in adult 06/15/2017  . Malfunction of implantable cardioverter-defibrillator (ICD) electrode 10/07/2016  . Chronic kidney disease (CKD), stage III (moderate) ([place]) 04/04/2015  . Bladder neck obstruction 03/21/2015  . Difficult or painful urination 03/21/2015  . Flank pain 03/21/2015  . Delayed onset of urination 03/21/2015  . Calculus of kidney 03/21/2015  . Erosive esophagitis 12/27/2013  . Hypotension due to drugs 07/02/2013  . Chronic systolic heart failure ([place]) 04/24/2013  . CAP (community acquired pneumonia) 03/16/2013  . HTN (hypertension) 03/15/2013  . Hydrocele 03/15/2013  . Spermatocele 03/15/2013  . DOE (dyspnea on exertion) 03/15/2013  . OSA (obstructive sleep apnea) 01/09/2013  . Chronic kidney disease, stage 3 ([place]) 01/03/2013  . Chest pain 12/26/2012  . History of diagnostic tests 09/06/2012  . TIA (transient ischemic attack)   . Cardiomyopathy, nonischemic ([place]) 02/24/2010  . AICD (automatic cardioverter/defibrillator) 02/24/2010  . DM type 2 causing vascular disease ([place]) 06/27/2009  . Gout 06/27/2009  . Tobacco abuse, in remission 06/27/2009  . COLONIC POLYPS 10/31/2008  . GERD (gastroesophageal reflux disease) 10/31/2008   Past Medical History:  Diagnosis Date  . AICD (automatic cardioverter/defibrillator) present 11/2007   a. 11/2007 SJM Current VR - single lead ICD  - Removed 2018 - "it was burning me"  . Anxiety   . CAD (coronary artery disease)    non-obstructive CAD by Cor CT in 2020 // Myoview 3/22: EF 57, small inf-sept defect c/w scar, no ischemia; low risk     . Chest pain    a. 10/2007 Cath:  normal Cors.  . CKD (chronic kidney disease),  stage II   . DDD (degenerative disc disease), lumbar   . Diabetes mellitus DX: 2010  . Erosive esophagitis    a. per EGD (08/2011), Dr. Laural Golden - Erosive reflux esophagitis improved but not completely healed since previous EGD 3 years ago. Bx showing  ulcerated gatroesophageal junction mucosa. negative for H. pylori  . GERD (gastroesophageal reflux disease)   . Gout   . Hearing deficit    a. wear bilateral hearing aides  . History of hiatal hernia   . Hypertension   . Mildly dilatd aortic root ([place])    CMR 4/22: EF 52, no LGE; d/w Dr. Marlowe Kays root 38 mm (mildly dilated)  . Myocardial infarction ([place]) 2011  . Neuropathy    Feet and legs  . Nonischemic dilated cardiomyopathy ([place])    a. H/O EF as low as 35-40% by LV gram 10/2007;  b. Echo 02/2011 EF  50-55%, inf HK, Gr 1 DD // CMR 4/22: EF 52, no LGE; d/w Dr. Marlowe Kays root 38 mm (mildly dilated)   . Renal insufficiency   . Sleep apnea    pt doesnt use, states"I cant afford one". PCP aware  . Stroke ([place])    mini-stroke in 2014  . TIA (transient ischemic attack)    July, 2013  . Tobacco abuse, in remission 06/27/2009   Discontinued in 2009    . Wears dentures    top plate  . WPW (Wolff-Parkinson-White syndrome)    a. s/p RFCA @ [facility]    Family History  Problem Relation Age of Onset  . Heart attack Mother 13  . Hypertension Mother   . Diabetes Mother   . Kidney disease Mother   . Breast cancer Mother   . Heart attack Father 56  . Hypertension Father   . Diabetes Father   . Stroke Brother   . Heart attack Brother 55  . Stroke Maternal Grandmother 80  . Diabetes Brother   . Hypertension Brother     Past Surgical History:  Procedure Laterality Date  . APPENDECTOMY    . BACK SURGERY     1995  . CARDIAC CATHETERIZATION     2009  . CARDIAC DEFIBRILLATOR PLACEMENT    . CHOLECYSTECTOMY    . CHOLECYSTECTOMY, LAPAROSCOPIC     11/2007  . COLONOSCOPY W/ POLYPECTOMY  2009  . ELBOW SURGERY Left 06/2010  . ESOPHAGEAL  DILATION N/A 11/08/2014   Procedure: ESOPHAGEAL DILATION;  Surgeon: Rogene Houston, MD;  Location: [facility];  Service: Endoscopy;  Laterality: N/A;  #56,   . ESOPHAGOGASTRODUODENOSCOPY  03/31/2012   also 08/2011; [name]  . ESOPHAGOGASTRODUODENOSCOPY (EGD) WITH PROPOFOL N/A 11/08/2014   Procedure: ESOPHAGOGASTRODUODENOSCOPY (EGD) WITH PROPOFOL;  Surgeon: Rogene Houston, MD;  Location: [facility];  Service: Endoscopy;  Laterality: N/A;  Hiatus is 39 , GE Junction is 37  . FOOT ARTHRODESIS Left 10/24/2020   Procedure: LEFT GASTROCNEMIUS RECESSION, DORSIFLEXION OSTEOTOMY 1ST MT;  Surgeon: [name], MD;  Location: [facility];  Service: Orthopedics;  Laterality: Left;  . ICD LEAD REMOVAL N/A 10/07/2016   Procedure: ICD LEAD REMOVAL ;  Surgeon: Evans Lance, MD;  Location: [facility];  Service: Cardiovascular;  Laterality: N/A;  . MULTIPLE TOOTH EXTRACTIONS    . [place]; performed at [place] N/A 10/07/2016   Procedure: TRANSESOPHAGEAL ECHOCARDIOGRAM (TEE);  Surgeon: Evans Lance, MD;  Location: [facility];  Service: Cardiovascular;  Laterality: N/A;   Social History   Occupational History  . Occupation: Worked for [employer]: [place] off in 11/11  Tobacco Use  . Smoking status: Former Smoker    Packs/day: 1.50    Years: 35.00    Pack years: 52.50    Types: Cigarettes    Start date: 06/30/1971    Quit date: 07/30/2010    Years since quitting: 10.3  . Smokeless tobacco: Former Systems developer    Types: [place] date: 03/26/1994  Vaping Use  . Vaping Use: Former  . Start date: 07/30/2010  . Quit date: 06/29/2016  Substance and Sexual Activity  . Alcohol use: No    Alcohol/week: 1.0 standard drink    Types: 1 Cans of beer per week    Comment: used "years ago"  . Drug use: No  . Sexual activity: Never  Birth control/protection: None

## 2020-12-12 DIAGNOSIS — E063 Autoimmune thyroiditis: Secondary | ICD-10-CM | POA: Diagnosis not present

## 2020-12-12 DIAGNOSIS — G894 Chronic pain syndrome: Secondary | ICD-10-CM | POA: Diagnosis not present

## 2020-12-12 DIAGNOSIS — I428 Other cardiomyopathies: Secondary | ICD-10-CM | POA: Diagnosis not present

## 2020-12-12 DIAGNOSIS — E1140 Type 2 diabetes mellitus with diabetic neuropathy, unspecified: Secondary | ICD-10-CM | POA: Diagnosis not present

## 2021-01-02 ENCOUNTER — Other Ambulatory Visit: Payer: Self-pay

## 2021-01-06 ENCOUNTER — Other Ambulatory Visit: Payer: Self-pay | Admitting: Physician Assistant

## 2021-01-06 DIAGNOSIS — E119 Type 2 diabetes mellitus without complications: Secondary | ICD-10-CM | POA: Diagnosis not present

## 2021-01-06 DIAGNOSIS — I10 Essential (primary) hypertension: Secondary | ICD-10-CM | POA: Diagnosis not present

## 2021-01-07 ENCOUNTER — Encounter (HOSPITAL_COMMUNITY): Payer: Self-pay | Admitting: Orthopedic Surgery

## 2021-01-07 NOTE — Progress Notes (Signed)
EKG: 10/08/20 CXR: 12/29/17 ECHO: 01/08/19 Stress Test: 07/16/13 Cardiac Cath: 2009  Fasting Blood Sugar- 180-230 Checks Blood Sugar__2_ times a day  OSA/CPAP: Yes, but does not wear cpap  ASA: Patient will call and see if can continue or if he should stop Blood Thinners: No  Covid test not needed  Anesthesia Review:  Yes, cardiac history  Patient denies shortness of breath, fever, cough, and chest pain at PAT appointment.  Patient verbalized understanding of instructions provided today at the PAT appointment.  Patient asked to review instructions at home and day of surgery.

## 2021-01-08 NOTE — Progress Notes (Addendum)
Anesthesia Chart Review: Same day workup  Follows with cardiology for hx ofnonischemic cardiomyopathy,dyspnea, left-sided chest pain, orthopnea,  hypertension, obstructive sleep apnea. History of anonischemic cardiomyopathy. Cardiac catheterization in 2009 demonstrated normal coronary arteries. He has a history of WPW ablation at [facility] in 1999. He had initial ICD implantation in 2009 which was removed in March 2018 due to system malfunction.  CT in 2020 demonstrated mild to moderate nonobstructive CAD. Last seen 10/08/2020 for preop evaluation.  At that time patient was reporting for some chest discomfort and exertional shortness of breath.  Overall, his symptoms were not felt to be typical for cardiac etiology, but given poor functional capacity stress test was ordered for further restratification.  Stress test 10/13/2020 was low risk and EF was normalized to 57%.  There was an inferoseptal defect that was fixed and consistent with scar, no significant ischemia.  He was cleared for surgery which she did undergo 9/50/9326 without complication.  Due to defect seen on stress test, cardiac MRI was ordered which showed low normal ejection fraction 52%, no myocardial LGE, so no definitive evidence for prior MI, infiltrative disease, or myocarditis.  CKD stage II.  History of erosive esophagitis.  History of OSA, does not wear CPAP.  IDDM 2.  Will need DOS labs and evaluation.  EKG 10/08/2020: NSR.  Rate 84.  Possible lateral infarct, age undetermined.  Cardiac MRI 11/17/2020: IMPRESSION: 1.  Normal LV size with mild diffuse hypokinesis, EF 52%.  2.  Normal RV size and systolic function, EF 71%.  3. No myocardial LGE, so no definitive evidence for prior MI, infiltrative disease, or myocarditis.  Nuclear stress 10/13/2020:  No diagnostic ST segment changes indicate ischemia.  Small, mild to moderate intensity, apical to basal inferoseptal defect that is fixed and consistent with  scar, no significant ischemia.  This is a low risk study.  Nuclear stress EF: 57%.  Cardiac/coronary CT January 31, 2019 IMPRESSION: 1. Mild CAD, CADRADS = 2. Mild CAD noted in the mid LAD and proximal circumflex artery.  2. The patient's coronary artery calcium score is 45, which places the patient in the 58 percentile, which is average when compared to age and sex matched peers.  3. Normal coronary origin with right dominance.  4. Sinus of Valsalva dilation R-L: 45 mm, L-Non: 42 mm, R-Non: 43 mm measured double oblique. Normal caliber mid ascending aorta.  5. Mild dilation of the main pulmonary artery, 33 mm. This finding could suggest pulmonary hypertension  Echocardiogram January 08, 2019 IMPRESSIONS 1. The left ventricle has mild-moderately reduced systolic function, with an ejection fraction of 40-45%. The cavity size was normal. There is mild concentric left ventricular hypertrophy. Left ventricular diastolic Doppler parameters are consistent  with impaired relaxation. Elevated left ventricular end-diastolic pressure Left ventrical global hypokinesis without regional wall motion abnormalities. 2. The mitral valve is grossly normal. 3. The tricuspid valve is grossly normal. 4. The aortic valve is tricuspid. 5. The aortic root is normal in size and structure. 6. The interatrial septum was not well visualized. Previous echocardiogram from 2017 for comparison Study Conclusions - Left ventricle: The cavity size was normal. Wall thickness was normal. Systolic function was normal. The estimated ejection fraction was in the range of 55% to 60%. Left ventricular diastolic function parameters were normal   Wynonia Musty [facility] Short Stay Center/Anesthesiology Phone [phone] 01/08/2021 11:35 AM

## 2021-01-08 NOTE — Anesthesia Preprocedure Evaluation (Addendum)
Anesthesia Evaluation  Patient identified by MRN, date of birth, ID band Patient awake    Reviewed: Allergy & Precautions, NPO status , Patient's Chart, lab work & pertinent test results, reviewed documented beta blocker date and time   Airway Mallampati: III  TM Distance: >3 FB Neck ROM: Full    Dental  (+) Edentulous Upper, Edentulous Lower   Pulmonary sleep apnea , pneumonia, resolved, former smoker,  Non compliant with CPAP   Pulmonary exam normal breath sounds clear to auscultation       Cardiovascular hypertension, Pt. on medications and Pt. on home beta blockers + CAD, + Past MI and + DOE  + dysrhythmias + Cardiac Defibrillator  Rhythm:Regular Rate:Normal  Hx/o Non ischemic CM Hx/o WPW S/P RFA  EKG 10/08/20 NSR, poss lateral MI  Echo 01/08/19 1. The left ventricle has mild-moderately reduced systolic function, with an ejection fraction of 40-45%. The cavity size was normal. There is mild concentric left ventricular hypertrophy. Left ventricular diastolic Doppler parameters are consistent with impaired relaxation. Elevated left ventricular end-diastolic pressure Left ventrical global hypokinesis without regional wall motion abnormalities.  2. The mitral valve is grossly normal.  3. The tricuspid valve is grossly normal.  4. The aortic valve is tricuspid.  5. The aortic root is normal in size and structure.  6. The interatrial septum was not well visualized.    Neuro/Psych Anxiety Peripheral neuropathy HOH wears bilateral hearing aids TIA Neuromuscular disease CVA, No Residual Symptoms    GI/Hepatic Neg liver ROS, hiatal hernia, PUD, GERD  Medicated,  Endo/Other  diabetes, Poorly Controlled, Type 2, Insulin Dependent, Oral Hypoglycemic AgentsObesity HLD Gout  Renal/GU Renal InsufficiencyRenal disease Bladder dysfunction      Musculoskeletal  (+) Arthritis , Osteoarthritis,  Open wound right foot    Abdominal   Peds  Hematology  (+) anemia ,   Anesthesia Other Findings   Reproductive/Obstetrics                         Anesthesia Physical Anesthesia Plan  ASA: III  Anesthesia Plan: General   Post-op Pain Management:    Induction: Intravenous  PONV Risk Score and Plan:   Airway Management Planned: LMA  Additional Equipment:   Intra-op Plan:   Post-operative Plan: Extubation in OR  Informed Consent: I have reviewed the patients History and Physical, chart, labs and discussed the procedure including the risks, benefits and alternatives for the proposed anesthesia with the patient or authorized representative who has indicated his/her understanding and acceptance.     Dental advisory given  Plan Discussed with: CRNA and Anesthesiologist  Anesthesia Plan Comments: (PAT note by Karoline Caldwell, PA-C: Follows with cardiology for hx ofnonischemic cardiomyopathy,dyspnea, left-sided chest pain, orthopnea,  hypertension, obstructive sleep apnea. History of anonischemic cardiomyopathy. Cardiac catheterization in 2009 demonstrated normal coronary arteries. He has a history of WPW ablation at [facility] in 1999. He had initial ICD implantation in 2009 which was removed in March 2018 due to system malfunction.  CT in 2020 demonstrated mild to moderate nonobstructive CAD. Last seen 10/08/2020 for preop evaluation.  At that time patient was reporting for some chest discomfort and exertional shortness of breath.  Overall, his symptoms were not felt to be typical for cardiac etiology, but given poor functional capacity stress test was ordered for further restratification.  Stress test 10/13/2020 was low risk and EF was normalized to 57%.  There was an inferoseptal defect that was fixed and consistent with scar,  no significant ischemia.  He was cleared for surgery which she did undergo 6/96/2952 without complication.  Due to defect seen on stress test, cardiac MRI was  ordered which showed low normal ejection fraction 52%, no myocardial LGE, so no definitive evidence for prior MI, infiltrative disease, or myocarditis.  CKD stage II.  History of erosive esophagitis.  History of OSA, does not wear CPAP.  IDDM 2.  Will need DOS labs and evaluation.  EKG 10/08/2020: NSR.  Rate 84.  Possible lateral infarct, age undetermined.  Cardiac MRI 11/17/2020: IMPRESSION: 1.  Normal LV size with mild diffuse hypokinesis, EF 52%.  2.  Normal RV size and systolic function, EF 84%.  3. No myocardial LGE, so no definitive evidence for prior MI, infiltrative disease, or myocarditis.  Nuclear stress 10/13/2020: No diagnostic ST segment changes indicate ischemia. Small, mild to moderate intensity, apical to basal inferoseptal defect that is fixed and consistent with scar, no significant ischemia. This is a low risk study. Nuclear stress EF: 57%.  Cardiac/coronary CT January 31, 2019 IMPRESSION: 1. Mild CAD, CADRADS = 2. Mild CAD noted in the mid LAD and proximal circumflex artery.  2. The patient's coronary artery calcium score is 45, which places the patient in the 58 percentile, which is average when compared to age and sex matched peers.  3. Normal coronary origin with right dominance.  4. Sinus of Valsalva dilation R-L: 45 mm, L-Non: 42 mm, R-Non: 43 mm measured double oblique. Normal caliber mid ascending aorta.  5. Mild dilation of the main pulmonary artery, 33 mm. This finding could suggest pulmonary hypertension  Echocardiogram January 08, 2019 IMPRESSIONS 1. The left ventricle has mild-moderately reduced systolic function, with an ejection fraction of 40-45%. The cavity size was normal. There is mild concentric left ventricular hypertrophy. Left ventricular diastolic Doppler parameters are consistent  with impaired relaxation. Elevated left ventricular end-diastolic pressure Left ventrical global hypokinesis without regional wall motion  abnormalities. 2. The mitral valve is grossly normal. 3. The tricuspid valve is grossly normal. 4. The aortic valve is tricuspid. 5. The aortic root is normal in size and structure. 6. The interatrial septum was not well visualized. Previous echocardiogram from 2017 for comparison Study Conclusions - Left ventricle: The cavity size was normal. Wall thickness was normal. Systolic function was normal. The estimated ejection fraction was in the range of 55% to 60%. Left ventricular diastolic function parameters were normal  )     Anesthesia Quick Evaluation

## 2021-01-09 ENCOUNTER — Other Ambulatory Visit: Payer: Self-pay

## 2021-01-09 ENCOUNTER — Encounter (HOSPITAL_COMMUNITY): Payer: Self-pay | Admitting: Orthopedic Surgery

## 2021-01-09 ENCOUNTER — Ambulatory Visit (HOSPITAL_COMMUNITY): Payer: Medicare Other | Admitting: Emergency Medicine

## 2021-01-09 ENCOUNTER — Encounter (HOSPITAL_COMMUNITY)
Admission: RE | Disposition: A | Discharge status: Home / Self Care | Payer: Self-pay | Source: Home / Self Care | Attending: Orthopedic Surgery

## 2021-01-09 ENCOUNTER — Ambulatory Visit (HOSPITAL_COMMUNITY)
Admission: RE | Admit: 2021-01-09 | Discharge: 2021-01-09 | Disposition: A | Discharge status: Home / Self Care | Payer: Medicare Other | Attending: Orthopedic Surgery | Admitting: Orthopedic Surgery

## 2021-01-09 DIAGNOSIS — Z7984 Long term (current) use of oral hypoglycemic drugs: Secondary | ICD-10-CM | POA: Diagnosis not present

## 2021-01-09 DIAGNOSIS — Z87891 Personal history of nicotine dependence: Secondary | ICD-10-CM | POA: Diagnosis not present

## 2021-01-09 DIAGNOSIS — Z823 Family history of stroke: Secondary | ICD-10-CM | POA: Diagnosis not present

## 2021-01-09 DIAGNOSIS — Z794 Long term (current) use of insulin: Secondary | ICD-10-CM | POA: Diagnosis not present

## 2021-01-09 DIAGNOSIS — I2510 Atherosclerotic heart disease of native coronary artery without angina pectoris: Secondary | ICD-10-CM | POA: Diagnosis not present

## 2021-01-09 DIAGNOSIS — M6701 Short Achilles tendon (acquired), right ankle: Secondary | ICD-10-CM | POA: Diagnosis not present

## 2021-01-09 DIAGNOSIS — E11621 Type 2 diabetes mellitus with foot ulcer: Secondary | ICD-10-CM | POA: Diagnosis not present

## 2021-01-09 DIAGNOSIS — E1140 Type 2 diabetes mellitus with diabetic neuropathy, unspecified: Secondary | ICD-10-CM | POA: Insufficient documentation

## 2021-01-09 DIAGNOSIS — Z8673 Personal history of transient ischemic attack (TIA), and cerebral infarction without residual deficits: Secondary | ICD-10-CM | POA: Insufficient documentation

## 2021-01-09 DIAGNOSIS — Z9049 Acquired absence of other specified parts of digestive tract: Secondary | ICD-10-CM | POA: Diagnosis not present

## 2021-01-09 DIAGNOSIS — Z95810 Presence of automatic (implantable) cardiac defibrillator: Secondary | ICD-10-CM | POA: Diagnosis not present

## 2021-01-09 DIAGNOSIS — Z841 Family history of disorders of kidney and ureter: Secondary | ICD-10-CM | POA: Diagnosis not present

## 2021-01-09 DIAGNOSIS — I252 Old myocardial infarction: Secondary | ICD-10-CM | POA: Diagnosis not present

## 2021-01-09 DIAGNOSIS — I129 Hypertensive chronic kidney disease with stage 1 through stage 4 chronic kidney disease, or unspecified chronic kidney disease: Secondary | ICD-10-CM | POA: Insufficient documentation

## 2021-01-09 DIAGNOSIS — L97529 Non-pressure chronic ulcer of other part of left foot with unspecified severity: Secondary | ICD-10-CM | POA: Diagnosis not present

## 2021-01-09 DIAGNOSIS — N182 Chronic kidney disease, stage 2 (mild): Secondary | ICD-10-CM | POA: Insufficient documentation

## 2021-01-09 DIAGNOSIS — M216X1 Other acquired deformities of right foot: Secondary | ICD-10-CM | POA: Diagnosis not present

## 2021-01-09 DIAGNOSIS — G4730 Sleep apnea, unspecified: Secondary | ICD-10-CM | POA: Insufficient documentation

## 2021-01-09 DIAGNOSIS — L97519 Non-pressure chronic ulcer of other part of right foot with unspecified severity: Secondary | ICD-10-CM | POA: Insufficient documentation

## 2021-01-09 DIAGNOSIS — I420 Dilated cardiomyopathy: Secondary | ICD-10-CM | POA: Diagnosis not present

## 2021-01-09 DIAGNOSIS — M6702 Short Achilles tendon (acquired), left ankle: Secondary | ICD-10-CM | POA: Diagnosis not present

## 2021-01-09 DIAGNOSIS — Z8249 Family history of ischemic heart disease and other diseases of the circulatory system: Secondary | ICD-10-CM | POA: Insufficient documentation

## 2021-01-09 DIAGNOSIS — L97511 Non-pressure chronic ulcer of other part of right foot limited to breakdown of skin: Secondary | ICD-10-CM

## 2021-01-09 DIAGNOSIS — E1122 Type 2 diabetes mellitus with diabetic chronic kidney disease: Secondary | ICD-10-CM | POA: Insufficient documentation

## 2021-01-09 DIAGNOSIS — Z833 Family history of diabetes mellitus: Secondary | ICD-10-CM | POA: Diagnosis not present

## 2021-01-09 HISTORY — PX: GASTROCNEMIUS RECESSION: SHX863

## 2021-01-09 HISTORY — PX: FOOT ARTHRODESIS: SHX1655

## 2021-01-09 LAB — BASIC METABOLIC PANEL
Anion gap: 6 (ref 5–15)
BUN: 11 mg/dL (ref 8–23)
CO2: 28 mmol/L (ref 22–32)
Calcium: 9 mg/dL (ref 8.9–10.3)
Chloride: 103 mmol/L (ref 98–111)
Creatinine, Ser: 1.55 mg/dL — ABNORMAL HIGH (ref 0.61–1.24)
GFR, Estimated: 51 mL/min — ABNORMAL LOW (ref >60)
Glucose, Bld: 110 mg/dL — ABNORMAL HIGH (ref 70–99)
Potassium: 3.9 mmol/L (ref 3.5–5.1)
Sodium: 137 mmol/L (ref 135–145)

## 2021-01-09 LAB — CBC
HCT: 45.2 % (ref 39.0–52.0)
Hemoglobin: 15.2 g/dL (ref 13.0–17.0)
MCH: 29.1 pg (ref 26.0–34.0)
MCHC: 33.6 g/dL (ref 30.0–36.0)
MCV: 86.4 fL (ref 80.0–100.0)
Platelets: 142 10*3/uL — ABNORMAL LOW (ref 150–400)
RBC: 5.23 MIL/uL (ref 4.22–5.81)
RDW: 14.5 % (ref 11.5–15.5)
WBC: 6.6 10*3/uL (ref 4.0–10.5)
nRBC: 0 % (ref 0.0–0.2)

## 2021-01-09 LAB — GLUCOSE, CAPILLARY
Glucose-Capillary: 121 mg/dL — ABNORMAL HIGH (ref 70–99)
Glucose-Capillary: 96 mg/dL (ref 70–99)

## 2021-01-09 SURGERY — FUSION, JOINT, FOOT
Anesthesia: General | Site: Foot | Laterality: Right

## 2021-01-09 MED ORDER — EPHEDRINE SULFATE-NACL 50-0.9 MG/10ML-% IV SOSY
PREFILLED_SYRINGE | INTRAVENOUS | Status: DC | PRN
  Administered 2021-01-09: 10 mg via INTRAVENOUS

## 2021-01-09 MED ORDER — OXYCODONE-ACETAMINOPHEN 5-325 MG PO TABS: 1.0000 | ORAL_TABLET | ORAL | 0 refills | Status: AC | PRN

## 2021-01-09 MED ORDER — OXYCODONE HCL 5 MG PO TABS
5.0000 mg | ORAL_TABLET | Freq: Once | ORAL | Status: DC | PRN
Start: 2021-01-09 — End: 2021-01-09

## 2021-01-09 MED ORDER — CEFAZOLIN SODIUM-DEXTROSE 2-4 GM/100ML-% IV SOLN
2.0000 g | INTRAVENOUS | Status: AC
  Administered 2021-01-09: 2 g via INTRAVENOUS
  Filled 2021-01-09: qty 100

## 2021-01-09 MED ORDER — 0.9 % SODIUM CHLORIDE (POUR BTL) OPTIME
TOPICAL | Status: DC | PRN
  Administered 2021-01-09: 1000 mL

## 2021-01-09 MED ORDER — FENTANYL CITRATE (PF) 100 MCG/2ML IJ SOLN
INTRAMUSCULAR | Status: DC | PRN
  Administered 2021-01-09: 50 ug via INTRAVENOUS
  Administered 2021-01-09 (×2): 25 ug via INTRAVENOUS

## 2021-01-09 MED ORDER — HYDROMORPHONE HCL 1 MG/ML IJ SOLN: 0.2500 mg | INTRAMUSCULAR | Status: DC | PRN

## 2021-01-09 MED ORDER — ONDANSETRON HCL 4 MG/2ML IJ SOLN
INTRAMUSCULAR | Status: DC | PRN
  Administered 2021-01-09: 4 mg via INTRAVENOUS

## 2021-01-09 MED ORDER — ORAL CARE MOUTH RINSE: 15.0000 mL | Freq: Once | OROMUCOSAL | Status: AC

## 2021-01-09 MED ORDER — OXYCODONE HCL 5 MG/5ML PO SOLN
5.0000 mg | Freq: Once | ORAL | Status: DC | PRN
Start: 2021-01-09 — End: 2021-01-09

## 2021-01-09 MED ORDER — PROPOFOL 10 MG/ML IV BOLUS
INTRAVENOUS | Status: DC | PRN
  Administered 2021-01-09: 160 mg via INTRAVENOUS

## 2021-01-09 MED ORDER — LACTATED RINGERS IV SOLN: INTRAVENOUS | Status: DC

## 2021-01-09 MED ORDER — ONDANSETRON HCL 4 MG/2ML IJ SOLN: 4.0000 mg | Freq: Once | INTRAMUSCULAR | Status: DC | PRN

## 2021-01-09 MED ORDER — FENTANYL CITRATE (PF) 250 MCG/5ML IJ SOLN
INTRAMUSCULAR | Status: AC
  Filled 2021-01-09: qty 5

## 2021-01-09 MED ORDER — MIDAZOLAM HCL 2 MG/2ML IJ SOLN
INTRAMUSCULAR | Status: AC
  Filled 2021-01-09: qty 2

## 2021-01-09 MED ORDER — ONDANSETRON HCL 4 MG/2ML IJ SOLN
INTRAMUSCULAR | Status: AC
  Filled 2021-01-09: qty 2

## 2021-01-09 MED ORDER — PROPOFOL 10 MG/ML IV BOLUS
INTRAVENOUS | Status: AC
  Filled 2021-01-09: qty 20

## 2021-01-09 MED ORDER — PHENYLEPHRINE 40 MCG/ML (10ML) SYRINGE FOR IV PUSH (FOR BLOOD PRESSURE SUPPORT)
PREFILLED_SYRINGE | INTRAVENOUS | Status: DC | PRN
  Administered 2021-01-09: 40 ug via INTRAVENOUS
  Administered 2021-01-09: 80 ug via INTRAVENOUS
  Administered 2021-01-09: 120 ug via INTRAVENOUS
  Administered 2021-01-09: 80 ug via INTRAVENOUS
  Administered 2021-01-09: 120 ug via INTRAVENOUS
  Administered 2021-01-09: 40 ug via INTRAVENOUS

## 2021-01-09 MED ORDER — BUPIVACAINE HCL (PF) 0.25 % IJ SOLN
INTRAMUSCULAR | Status: DC | PRN
  Administered 2021-01-09: 20 mL

## 2021-01-09 MED ORDER — BUPIVACAINE HCL (PF) 0.25 % IJ SOLN
INTRAMUSCULAR | Status: AC
  Filled 2021-01-09: qty 30

## 2021-01-09 MED ORDER — LIDOCAINE 2% (20 MG/ML) 5 ML SYRINGE
INTRAMUSCULAR | Status: DC | PRN
  Administered 2021-01-09: 80 mg via INTRAVENOUS

## 2021-01-09 MED ORDER — EPHEDRINE SULFATE 50 MG/ML IJ SOLN
INTRAMUSCULAR | Status: DC | PRN
  Administered 2021-01-09: 5 mg via INTRAVENOUS

## 2021-01-09 MED ORDER — CHLORHEXIDINE GLUCONATE 0.12 % MT SOLN
15.0000 mL | Freq: Once | OROMUCOSAL | Status: AC
  Administered 2021-01-09: 15 mL via OROMUCOSAL
  Filled 2021-01-09: qty 15

## 2021-01-09 MED ORDER — MIDAZOLAM HCL 5 MG/5ML IJ SOLN
INTRAMUSCULAR | Status: DC | PRN
  Administered 2021-01-09: 1 mg via INTRAVENOUS

## 2021-01-09 SURGICAL SUPPLY — 50 items
BANDAGE ESMARK 6X9 LF (GAUZE/BANDAGES/DRESSINGS) IMPLANT
BLADE SAW SGTL HD 18.5X60.5X1. (BLADE) ×3 IMPLANT
BLADE SURG 10 STRL SS (BLADE) IMPLANT
BNDG COHESIVE 4X5 TAN STRL (GAUZE/BANDAGES/DRESSINGS) ×3 IMPLANT
BNDG COHESIVE 6X5 TAN STRL LF (GAUZE/BANDAGES/DRESSINGS) ×3 IMPLANT
BNDG ESMARK 6X9 LF (GAUZE/BANDAGES/DRESSINGS)
BNDG GAUZE ELAST 4 BULKY (GAUZE/BANDAGES/DRESSINGS) ×3 IMPLANT
COTTON STERILE ROLL (GAUZE/BANDAGES/DRESSINGS) ×3 IMPLANT
COVER MAYO STAND STRL (DRAPES) ×3 IMPLANT
COVER SURGICAL LIGHT HANDLE (MISCELLANEOUS) ×6 IMPLANT
COVER WAND RF STERILE (DRAPES) ×3 IMPLANT
DRAPE INCISE IOBAN 66X45 STRL (DRAPES) ×3 IMPLANT
DRAPE OEC MINIVIEW 54X84 (DRAPES) IMPLANT
DRAPE U-SHAPE 47X51 STRL (DRAPES) ×3 IMPLANT
DRESSING MEPILEX FLEX 4X4 (GAUZE/BANDAGES/DRESSINGS) ×2 IMPLANT
DRSG ADAPTIC 3X8 NADH LF (GAUZE/BANDAGES/DRESSINGS) ×3 IMPLANT
DRSG EMULSION OIL 3X3 NADH (GAUZE/BANDAGES/DRESSINGS) ×3 IMPLANT
DRSG MEPILEX FLEX 4X4 (GAUZE/BANDAGES/DRESSINGS) ×3
DURAPREP 26ML APPLICATOR (WOUND CARE) ×3 IMPLANT
ELECT REM PT RETURN 9FT ADLT (ELECTROSURGICAL) ×3
ELECTRODE REM PT RTRN 9FT ADLT (ELECTROSURGICAL) ×2 IMPLANT
GAUZE SPONGE 2X2 8PLY STRL LF (GAUZE/BANDAGES/DRESSINGS) ×2 IMPLANT
GAUZE SPONGE 4X4 12PLY STRL (GAUZE/BANDAGES/DRESSINGS) ×3 IMPLANT
GLOVE BIOGEL PI IND STRL 9 (GLOVE) ×2 IMPLANT
GLOVE BIOGEL PI INDICATOR 9 (GLOVE) ×1
GLOVE SURG ORTHO 9.0 STRL STRW (GLOVE) ×3 IMPLANT
GOWN STRL REUS W/ TWL XL LVL3 (GOWN DISPOSABLE) ×6 IMPLANT
GOWN STRL REUS W/TWL XL LVL3 (GOWN DISPOSABLE) ×9
KIT BASIN OR (CUSTOM PROCEDURE TRAY) ×3 IMPLANT
KIT STAPLE ARCUS 20X17 STRL (Staple) ×3 IMPLANT
KIT TURNOVER KIT B (KITS) ×3 IMPLANT
MANIFOLD NEPTUNE II (INSTRUMENTS) ×3 IMPLANT
NS IRRIG 1000ML POUR BTL (IV SOLUTION) ×3 IMPLANT
PACK ORTHO EXTREMITY (CUSTOM PROCEDURE TRAY) ×3 IMPLANT
PAD ABD 8X10 STRL (GAUZE/BANDAGES/DRESSINGS) ×3 IMPLANT
PAD ARMBOARD 7.5X6 YLW CONV (MISCELLANEOUS) ×6 IMPLANT
PAD CAST 4YDX4 CTTN HI CHSV (CAST SUPPLIES) ×2 IMPLANT
PADDING CAST ABS 4INX4YD NS (CAST SUPPLIES) ×1
PADDING CAST ABS COTTON 4X4 ST (CAST SUPPLIES) ×2 IMPLANT
PADDING CAST COTTON 4X4 STRL (CAST SUPPLIES) ×3
SPONGE GAUZE 2X2 STER 10/PKG (GAUZE/BANDAGES/DRESSINGS) ×1
SPONGE LAP 18X18 RF (DISPOSABLE) ×3 IMPLANT
SUCTION FRAZIER HANDLE 10FR (MISCELLANEOUS) ×3
SUCTION TUBE FRAZIER 10FR DISP (MISCELLANEOUS) ×2 IMPLANT
SUT ETHILON 2 0 PSLX (SUTURE) ×9 IMPLANT
TOWEL GREEN STERILE (TOWEL DISPOSABLE) ×3 IMPLANT
TOWEL GREEN STERILE FF (TOWEL DISPOSABLE) ×3 IMPLANT
TUBE CONNECTING 12X1/4 (SUCTIONS) ×3 IMPLANT
UNDERPAD 30X36 HEAVY ABSORB (UNDERPADS AND DIAPERS) ×3 IMPLANT
WATER STERILE IRR 1000ML POUR (IV SOLUTION) ×3 IMPLANT

## 2021-01-09 NOTE — Op Note (Signed)
01/09/2021  11:07 AM  PATIENT:  Mike Floyd    PRE-OPERATIVE DIAGNOSIS:  Ulcer Right Foot  POST-OPERATIVE DIAGNOSIS:  Same  PROCEDURE:  CLOSING WEDGE OSTEOTOMY RIGHT 1ST METATARSAL, RIGHT GASTROCNEMIUS RECESSION  SURGEON:  [name], MD  PHYSICIAN ASSISTANT:None ANESTHESIA:   General  PREOPERATIVE INDICATIONS:  Mike Floyd is a  60 y.o. male with a diagnosis of Ulcer Right Foot who failed conservative measures and elected for surgical management.    The risks benefits and alternatives were discussed with the patient preoperatively including but not limited to the risks of infection, bleeding, nerve injury, cardiopulmonary complications, the need for revision surgery, among others, and the patient was willing to proceed.  OPERATIVE IMPLANTS: Biomet staple  @ENCIMAGES @  OPERATIVE FINDINGS: Dorsal closing wedge osteotomy base of the first metatarsal medial cuneiform with stable alignment after internal fixation.  Dorsiflexion went from 10 degrees short of neutral to 30 degrees past neutral after recession.  OPERATIVE PROCEDURE: Patient was brought the operating room and underwent general anesthetic.  After adequate levels anesthesia were obtained patient's right lower extremity was prepped using DuraPrep draped into a sterile field a timeout was called.  The skin was locally infiltrated with a total of 20 cc of quarter percent Marcaine plain prior to surgical intervention.  A medial longitudinal incision was made 15 cm proximal to the medial malleolus this was carried bluntly down to the gastroc fascia and the gastroc fascia was released under direct visualization.  This took his dorsiflexion from 10 degrees short of neutral to 30 degrees past neutral.  The wound was irrigated normal saline incision was closed using 2-0 nylon.  A dorsal incision was made over the base of the first metatarsal.  The joint was protected with baby Hohmann retractors.  A dorsal wedge osteotomy was performed  at the base of the first metatarsal medial cuneiform joint the joint was closed and stabilized with a Biomet staple.  There was good apposition of the bone.  The wound was irrigated normal saline incision closed using 2-0 nylon.  The Wagner grade 1 ulcer beneath the first metatarsal head was debrided with a 21 blade knife back to bleeding viable healthy granulation tissue.  Sterile dressing was applied to all 3 areas patient was extubated taken the PACU in stable condition.   DISCHARGE PLANNING:  Antibiotic duration: Preoperative antibiotics  Weightbearing: Weightbearing as tolerated  Pain medication: Prescription for Percocet  Dressing care/ Wound VAC: Change dressing in 5 days  Ambulatory devices: Walker or crutches  Discharge to: Home.  Follow-up: In the office 1 week post operative.

## 2021-01-09 NOTE — H&P (Signed)
Mike Floyd is an 60 y.o. male.   Chief Complaint: Right Foot deformity HPI: Patient is a 61 year old gentleman who presents for 2 separate issues.  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  Patient states the ulcer is completely healed and he is quite pleased with his progress.  #2 patient complains of a chronic ulcer beneath the right first metatarsal head due to the plantar flexion deformity of the first ray and the Achilles contracture.  Past Medical History:  Diagnosis Date  . AICD (automatic cardioverter/defibrillator) present 11/2007   a. 11/2007 S[facility] Current VR - single lead ICD  - Removed 2018 - "it was burning me"  . Anxiety   . CAD (coronary artery disease)    non-obstructive CAD by Cor CT in 2020 // Myoview 3/22: EF 57, small inf-sept defect c/w scar, no ischemia; low risk     . Chest pain    a. 10/2007 Cath:  normal Cors.  . CKD (chronic kidney disease), stage II   . DDD (degenerative disc disease), lumbar   . Diabetes mellitus DX: 2010  . Erosive esophagitis    a. per EGD (08/2011), Dr. Laural Golden - Erosive reflux esophagitis improved but not completely healed since previous EGD 3 years ago. Bx showing  ulcerated gatroesophageal junction mucosa. negative for H. pylori  . GERD (gastroesophageal reflux disease)   . Gout   . Hearing deficit    a. wear bilateral hearing aides  . History of hiatal hernia   . Hypertension   . Mildly dilatd aortic root ([place])    CMR 4/22: EF 52, no LGE; d/w Dr. Marlowe Kays root 38 mm (mildly dilated)  . Myocardial infarction ([place]) 2011  . Neuropathy    Feet and legs  . Nonischemic dilated cardiomyopathy ([place])    a. H/O EF as low as 35-40% by LV gram 10/2007;  b. Echo 02/2011 EF 50-55%, inf HK, Gr 1 DD // CMR 4/22: EF 52, no LGE; d/w Dr. Marlowe Kays root 38 mm (mildly dilated)   . Renal insufficiency   . Sleep apnea    pt  doesnt use, states"I cant afford one". PCP aware  . Stroke ([place])    mini-stroke in 2014  . TIA (transient ischemic attack)    July, 2013  . Tobacco abuse, in remission 06/27/2009   Discontinued in 2009    . Wears dentures    top plate  . WPW (Wolff-Parkinson-White syndrome)    a. s/p RFCA @ [place]    Past Surgical History:  Procedure Laterality Date  . APPENDECTOMY    . BACK SURGERY     1995  . CARDIAC CATHETERIZATION     2009  . CARDIAC DEFIBRILLATOR PLACEMENT    . CHOLECYSTECTOMY    . CHOLECYSTECTOMY, LAPAROSCOPIC     11/2007  . COLONOSCOPY W/ POLYPECTOMY  2009  . ELBOW SURGERY Left 06/2010  . ESOPHAGEAL DILATION N/A 11/08/2014   Procedure: ESOPHAGEAL DILATION;  Surgeon: Rogene Houston, MD;  Location: [facility];  Service: Endoscopy;  Laterality: N/A;  #56,   . ESOPHAGOGASTRODUODENOSCOPY  03/31/2012   also 08/2011; [name]  . ESOPHAGOGASTRODUODENOSCOPY (EGD) WITH PROPOFOL N/A 11/08/2014   Procedure: ESOPHAGOGASTRODUODENOSCOPY (EGD) WITH PROPOFOL;  Surgeon: Rogene Houston, MD;  Location: [facility];  Service: Endoscopy;  Laterality: N/A;  [place] is 39 , [place] is 37  . FOOT ARTHRODESIS Left 10/24/2020  Procedure: LEFT GASTROCNEMIUS RECESSION, DORSIFLEXION OSTEOTOMY 1ST MT;  Surgeon: [name], MD;  Location: [facility];  Service: Orthopedics;  Laterality: Left;  . ICD LEAD REMOVAL N/A 10/07/2016   Procedure: ICD LEAD REMOVAL ;  Surgeon: Evans Lance, MD;  Location: [facility];  Service: Cardiovascular;  Laterality: N/A;  . MULTIPLE TOOTH EXTRACTIONS    . [place]; performed at [place] N/A 10/07/2016   Procedure: TRANSESOPHAGEAL ECHOCARDIOGRAM (TEE);  Surgeon: Evans Lance, MD;  Location: [facility] OR;  Service: Cardiovascular;  Laterality: N/A;    Family History  Problem Relation Age of Onset  . Heart attack Mother 40  . Hypertension Mother   . Diabetes Mother   . Kidney disease Mother   . Breast cancer Mother   . Heart  attack Father 23  . Hypertension Father   . Diabetes Father   . Stroke Brother   . Heart attack Brother 70  . Stroke Maternal Grandmother 80  . Diabetes Brother   . Hypertension Brother    Social History:  reports that he quit smoking about 10 years ago. His smoking use included cigarettes. He started smoking about 49 years ago. He has a 52.50 pack-year smoking history. He quit smokeless tobacco use about 26 years ago.  His smokeless tobacco use included chew. He reports that he does not drink alcohol and does not use drugs.  Allergies: No Known Allergies  No medications prior to admission.    No results found for this or any previous visit (from the past 48 hour(s)). No results found.  Review of Systems  All other systems reviewed and are negative.   There were no vitals taken for this visit. Physical Exam  Patient is alert, oriented, no adenopathy, well-dressed, normal affect, normal respiratory effort. Examination of the left first metatarsal head ulcer is completely healed the surgical incision has healed he has good dorsiflexion of the ankle.  Damage the right foot he has a chronic Wagner grade 1 ulcer beneath the first metatarsal head with a cavovarus foot plantarflexed first ray and Achilles contracture with dorsiflexion to neutral. Heart RRR Lungs clear Assessment/Plan 1. Achilles tendon contracture, bilateral   2. Cavovarus deformity of foot, acquired, unspecified laterality   3. Metatarsal deformity, right     Plan: Patient states he would like to proceed with surgical intervention on the right.  Plan for a dorsal closing wedge osteotomy of the base of the first metatarsal right foot as well as a gastrocnemius recession on the right risk and benefits were discussed including risk of infection.  Patient states he understands wished to proceed at this time.   Bevely Palmer Alvaretta Eisenberger, PA 01/09/2021, 6:32 AM

## 2021-01-09 NOTE — Transfer of Care (Signed)
Immediate Anesthesia Transfer of Care Note  Patient: Mike Floyd  Procedure(s) Performed: CLOSING WEDGE OSTEOTOMY RIGHT 1ST METATARSAL (Right Foot) RIGHT GASTROCNEMIUS RECESSION (Right )  Patient Location: PACU  Anesthesia Type:General  Level of Consciousness: drowsy  Airway & Oxygen Therapy: Patient Spontanous Breathing and Patient connected to face mask oxygen  Post-op Assessment: Report given to RN and Post -op Vital signs reviewed and stable  Post vital signs: Reviewed and stable  Last Vitals:  Vitals Value Taken Time  BP 87/63 01/09/21 1110  Temp    Pulse 70 01/09/21 1113  Resp 17 01/09/21 1113  SpO2 99 % 01/09/21 1113  Vitals shown include unvalidated device data.  Last Pain:  Vitals:   01/09/21 0933  TempSrc:   PainSc: 7          Complications: No complications documented.

## 2021-01-09 NOTE — Interval H&P Note (Signed)
History and Physical Interval Note:  01/09/2021 10:12 AM  Mike Floyd  has presented today for surgery, with the diagnosis of Ulcer Right Foot.  The various methods of treatment have been discussed with the patient and family. After consideration of risks, benefits and other options for treatment, the patient has consented to  Procedure(s): [place] (Right) RIGHT GASTROCNEMIUS RECESSION (Right) as a surgical intervention.  The patient's history has been reviewed, patient examined, no change in status, stable for surgery.  I have reviewed the patient's chart and labs.  Questions were answered to the patient's satisfaction.     [name]

## 2021-01-09 NOTE — Anesthesia Postprocedure Evaluation (Signed)
Anesthesia Post Note  Patient: Mike Floyd  Procedure(s) Performed: CLOSING WEDGE OSTEOTOMY RIGHT 1ST METATARSAL (Right Foot) RIGHT GASTROCNEMIUS RECESSION (Right )     Patient location during evaluation: PACU Anesthesia Type: General Level of consciousness: awake and alert and oriented Pain management: pain level controlled Vital Signs Assessment: post-procedure vital signs reviewed and stable Respiratory status: spontaneous breathing, nonlabored ventilation and respiratory function stable Cardiovascular status: blood pressure returned to baseline and stable Postop Assessment: no apparent nausea or vomiting Anesthetic complications: no   No complications documented.  Last Vitals:  Vitals:   01/09/21 1125 01/09/21 1140  BP: (!) 92/58 98/66  Pulse: 71 73  Resp: 13 12  Temp:    SpO2: 96% 94%    Last Pain:  Vitals:   01/09/21 1140  TempSrc:   PainSc: 0-No pain                 [name] A.

## 2021-01-09 NOTE — Anesthesia Procedure Notes (Signed)
Procedure Name: LMA Insertion Date/Time: 01/09/2021 10:24 AM Performed by: Gwyndolyn Saxon, CRNA Pre-anesthesia Checklist: Patient identified, Emergency Drugs available, Suction available and Patient being monitored Patient Re-evaluated:Patient Re-evaluated prior to induction Oxygen Delivery Method: Circle system utilized Preoxygenation: Pre-oxygenation with 100% oxygen Induction Type: IV induction Ventilation: Mask ventilation without difficulty LMA: LMA inserted LMA Size: 5.0 Number of attempts: 1 Placement Confirmation: positive ETCO2 and breath sounds checked- equal and bilateral Tube secured with: Tape Dental Injury: Teeth and Oropharynx as per pre-operative assessment

## 2021-01-09 NOTE — Progress Notes (Signed)
Pt waiting for ride in phase II. Vital signs stable. No further monitoring required.   Gabriel Rainwater, RN

## 2021-01-12 ENCOUNTER — Encounter (HOSPITAL_COMMUNITY): Payer: Self-pay | Admitting: Orthopedic Surgery

## 2021-01-14 ENCOUNTER — Other Ambulatory Visit (INDEPENDENT_AMBULATORY_CARE_PROVIDER_SITE_OTHER): Payer: Self-pay | Admitting: Gastroenterology

## 2021-01-14 DIAGNOSIS — R1319 Other dysphagia: Secondary | ICD-10-CM

## 2021-01-14 DIAGNOSIS — K219 Gastro-esophageal reflux disease without esophagitis: Secondary | ICD-10-CM

## 2021-01-16 ENCOUNTER — Encounter: Payer: Self-pay | Admitting: Family

## 2021-01-16 ENCOUNTER — Ambulatory Visit (INDEPENDENT_AMBULATORY_CARE_PROVIDER_SITE_OTHER): Payer: Medicare Other | Admitting: Family

## 2021-01-16 DIAGNOSIS — M6701 Short Achilles tendon (acquired), right ankle: Secondary | ICD-10-CM

## 2021-01-16 DIAGNOSIS — M21961 Unspecified acquired deformity of right lower leg: Secondary | ICD-10-CM

## 2021-01-16 NOTE — Progress Notes (Signed)
Post-Op Visit Note   Patient: Mike Floyd           Date of Birth: [birth year]           MRN: [id number] Visit Date: 01/16/2021 PCP: Redmond School, MD  Chief Complaint:  Chief Complaint  Patient presents with   Right Foot - Follow-up    HPI:  HPI Patient is a 60 year old gentleman seen status post closing wedge osteotomy of right first MT and gastroc recession on right.  Doing well he is in a cam walker. Ortho Exam Examination of the right lower extremity the incision of his calf is well approximated sutures there is no draining there is very minimal swelling.  The incision of the dorsum of his foot is well approximated sutures there is 1 drop of bloody drainage there is no surrounding erythema no gaping no drainage very minimal edema.  Visit Diagnoses:  1. Contracture of right Achilles tendon   2. Metatarsal deformity, left     Plan: Begin daily Dial soap cleansing.  Dry dressing changes.  Continue CAM Walker minimize weightbearing.  He will follow-up in 2 weeks.  Anticipate harvesting sutures at that time.  Follow-Up Instructions: Return in about 2 weeks (around 01/30/2021).   Imaging: No results found.  Orders:  No orders of the defined types were placed in this encounter.  No orders of the defined types were placed in this encounter.    PMFS History: Patient Active Problem List   Diagnosis Date Noted   Non-pressure chronic ulcer of other part of right foot limited to breakdown of skin ([place])    Contracture of right Achilles tendon    Mildly dilatd aortic root (HCC)    Contracture of left Achilles tendon    Metatarsal deformity, left    CAD (coronary artery disease)    Diarrhea 06/27/2019   Diastasis recti 26/94/8546   Umbilical hernia without obstruction and without gangrene 08/22/2018   Vitamin D deficiency 06/23/2017   Essential hypertension, benign 06/15/2017   Mixed hyperlipidemia 06/15/2017   Class 1 obesity due to excess calories with serious  comorbidity and body mass index (BMI) of 31.0 to 31.9 in adult 06/15/2017   Malfunction of implantable cardioverter-defibrillator (ICD) electrode 10/07/2016   Chronic kidney disease (CKD), stage III (moderate) ([place]) 04/04/2015   Bladder neck obstruction 03/21/2015   Difficult or painful urination 03/21/2015   Flank pain 03/21/2015   Delayed onset of urination 03/21/2015   Calculus of kidney 03/21/2015   Erosive esophagitis 12/27/2013   Hypotension due to drugs 27/10/5007   Chronic systolic heart failure ([place]) 04/24/2013   CAP (community acquired pneumonia) 03/16/2013   HTN (hypertension) 03/15/2013   Hydrocele 03/15/2013   Spermatocele 03/15/2013   DOE (dyspnea on exertion) 03/15/2013   OSA (obstructive sleep apnea) 01/09/2013   Chronic kidney disease, stage 3 ([place]) 01/03/2013   Chest pain 12/26/2012   History of diagnostic tests 09/06/2012   TIA (transient ischemic attack)    Cardiomyopathy, nonischemic ([place]) 02/24/2010   AICD (automatic cardioverter/defibrillator) 02/24/2010   DM type 2 causing vascular disease ([place]) 06/27/2009   Gout 06/27/2009   Tobacco abuse, in remission 06/27/2009   COLONIC POLYPS 10/31/2008   GERD (gastroesophageal reflux disease) 10/31/2008   Past Medical History:  Diagnosis Date   AICD (automatic cardioverter/defibrillator) present 11/2007   a. 11/2007 SJM Current VR - single lead ICD  - Removed 2018 - "it was burning me"   Anxiety    CAD (coronary artery disease)  non-obstructive CAD by Cor CT in 2020 // Myoview 3/22: EF 57, small inf-sept defect c/w scar, no ischemia; low risk      Chest pain    a. 10/2007 Cath:  normal Cors.   CKD (chronic kidney disease), stage II    DDD (degenerative disc disease), lumbar    Diabetes mellitus DX: 2010   Erosive esophagitis    a. per EGD (08/2011), Dr. Laural Golden - Erosive reflux esophagitis improved but not completely healed since previous EGD 3 years ago. Bx showing  ulcerated gatroesophageal junction mucosa.  negative for H. pylori   GERD (gastroesophageal reflux disease)    Gout    Hearing deficit    a. wear bilateral hearing aides   History of hiatal hernia    Hypertension    Mildly dilatd aortic root ([place])    CMR 4/22: EF 52, no LGE; d/w Dr. Marlowe Kays root 38 mm (mildly dilated)   Myocardial infarction [facility]) 2011   Neuropathy    Feet and legs   Nonischemic dilated cardiomyopathy ([place])    a. H/O EF as low as 35-40% by LV gram 10/2007;  b. Echo 02/2011 EF 50-55%, inf HK, Gr 1 DD // CMR 4/22: EF 52, no LGE; d/w Dr. Marlowe Kays root 38 mm (mildly dilated)    Renal insufficiency    Sleep apnea    pt doesnt use, states"I cant afford one". PCP aware   Stroke ([place])    mini-stroke in 2014   TIA (transient ischemic attack)    July, 2013   Tobacco abuse, in remission 06/27/2009   Discontinued in 2009     Wears dentures    top plate   WPW (Wolff-Parkinson-White syndrome)    a. s/p RFCA @ [place]    Family History  Problem Relation Age of Onset   Heart attack Mother 35   Hypertension Mother    Diabetes Mother    Kidney disease Mother    Breast cancer Mother    Heart attack Father 60   Hypertension Father    Diabetes Father    Stroke Brother    Heart attack Brother 77   Stroke Maternal Grandmother 80   Diabetes Brother    Hypertension Brother     Past Surgical History:  Procedure Laterality Date   [place]     2009   CARDIAC DEFIBRILLATOR [place], LAPAROSCOPIC     11/2007   COLONOSCOPY W/ POLYPECTOMY  2009   ELBOW SURGERY Left 06/2010   ESOPHAGEAL DILATION N/A 11/08/2014   Procedure: ESOPHAGEAL DILATION;  Surgeon: Rogene Houston, MD;  Location: [facility];  Service: Endoscopy;  Laterality: N/A;  #56,    ESOPHAGOGASTRODUODENOSCOPY  03/31/2012   also 08/2011; [name]   ESOPHAGOGASTRODUODENOSCOPY (EGD) WITH PROPOFOL N/A 11/08/2014   Procedure: ESOPHAGOGASTRODUODENOSCOPY (EGD) WITH  PROPOFOL;  Surgeon: Rogene Houston, MD;  Location: [facility];  Service: Endoscopy;  Laterality: N/A;  [place] is 71 , GE Junction is 37   FOOT ARTHRODESIS Left 10/24/2020   Procedure: LEFT GASTROCNEMIUS RECESSION, DORSIFLEXION OSTEOTOMY 1ST MT;  Surgeon: [name], MD;  Location: [place];  Service: Orthopedics;  Laterality: Left;   FOOT ARTHRODESIS Right 01/09/2021   Procedure: CLOSING WEDGE OSTEOTOMY RIGHT 1ST METATARSAL;  Surgeon: [name], MD;  Location: [place];  Service: Orthopedics;  Laterality: Right;   GASTROCNEMIUS RECESSION Right 01/09/2021  Procedure: RIGHT GASTROCNEMIUS RECESSION;  Surgeon: [name], MD;  Location: [place];  Service: Orthopedics;  Laterality: Right;   ICD LEAD REMOVAL N/A 10/07/2016   Procedure: ICD LEAD REMOVAL ;  Surgeon: Evans Lance, MD;  Location: [place];  Service: Cardiovascular;  Laterality: N/A;   [place]; performed at [facility]   TEE WITHOUT CARDIOVERSION N/A 10/07/2016   Procedure: TRANSESOPHAGEAL ECHOCARDIOGRAM (TEE);  Surgeon: Evans Lance, MD;  Location: [place];  Service: Cardiovascular;  Laterality: N/A;   Social History   Occupational History   Occupation: Worked for [employer]: [place]: Laid off in 11/11  Tobacco Use   Smoking status: Former    Packs/day: 1.50    Years: 35.00    Pack years: 52.50    Types: Cigarettes    Start date: 06/30/1971    Quit date: 07/30/2010    Years since quitting: 10.4   Smokeless tobacco: Former    Types: Chew    Quit date: 03/26/1994  Vaping Use   Vaping Use: Former   Start date: 07/30/2010   Quit date: 06/29/2016  Substance and Sexual Activity   Alcohol use: No    Alcohol/week: 1.0 standard drink    Types: 1 Cans of beer per week    Comment: used "years ago"   Drug use: No   Sexual activity: Never    Birth control/protection: None

## 2021-01-21 ENCOUNTER — Ambulatory Visit: Payer: Medicare Other | Admitting: Cardiovascular Disease

## 2021-01-23 ENCOUNTER — Ambulatory Visit (INDEPENDENT_AMBULATORY_CARE_PROVIDER_SITE_OTHER): Payer: Medicare Other | Admitting: Physician Assistant

## 2021-01-23 ENCOUNTER — Ambulatory Visit: Payer: Medicare Other | Admitting: Physician Assistant

## 2021-01-23 ENCOUNTER — Encounter: Payer: Self-pay | Admitting: Physician Assistant

## 2021-01-23 ENCOUNTER — Telehealth: Payer: Self-pay

## 2021-01-23 DIAGNOSIS — M6701 Short Achilles tendon (acquired), right ankle: Secondary | ICD-10-CM

## 2021-01-23 MED ORDER — SULFAMETHOXAZOLE-TRIMETHOPRIM 800-160 MG PO TABS: 1.0000 | ORAL_TABLET | Freq: Two times a day (BID) | ORAL | 0 refills | Status: DC

## 2021-01-23 NOTE — Telephone Encounter (Signed)
MADE APPT FOR EVAL TODAY

## 2021-01-23 NOTE — Progress Notes (Signed)
Office Visit Note   Patient: Mike Floyd           Date of Birth: 06/21/1961           MRN: [id number] Visit Date: 01/23/2021              Requested by: Redmond School, [place] [address],  [place] [id number] PCP: Redmond School, MD  No chief complaint on file.     HPI: Patient is 2 weeks status post closing wedge metatarsal osteotomy and gastrocnemius release.  His family called today because they noticed significant increase in redness of his calf wound.  He denies any fever or chills.  Assessment & Plan: Visit Diagnoses: No diagnosis found.  Plan: We will remove the sutures in his foot today.  We will place him on Bactrim and follow-up in 1 week.  Should call us if any concerning changes  Follow-Up Instructions: No follow-ups on file.   Ortho Exam  Patient is alert, oriented, no adenopathy, well-dressed, normal affect, normal respiratory effort. Examination well apposed wound edges in the calf wound foot wound is completely healed.  He does have some redness on the calf wound that extends out from the incision approximately 4 cm but there is no ascending cellulitis.  There is no swelling.  There is minimal amount of bloody drainage from the wound.Continue with daily dressing changes  Imaging: No results found. No images are attached to the encounter.  Labs: Lab Results  Component Value Date   HGBA1C 7.0 (H) 06/15/2017   HGBA1C 5.2 03/15/2013   HGBA1C 6.5 (H) 03/07/2012   CRP 2.2 06/27/2019   LABURIC 5.5 03/07/2012   REPTSTATUS 02/21/2008 FINAL 02/19/2008   CULT  02/19/2008    Multiple bacterial morphotypes present, none predominant. Suggest appropriate recollection if clinically indicated.     Lab Results  Component Value Date   ALBUMIN 4.6 12/27/2013   ALBUMIN 4.4 05/30/2013   ALBUMIN 4.2 03/16/2013    Lab Results  Component Value Date   MG 1.7 09/05/2012   MG 1.9 06/30/2009   MG 1.9 06/30/2009   Lab Results  Component Value Date    VD25OH 18 (L) 06/15/2017    No results found for: PREALBUMIN CBC EXTENDED Latest Ref Rng & Units 01/09/2021 10/24/2020 06/27/2019  WBC 4.0 - 10.5 K/uL 6.6 6.4 5.5  RBC 4.22 - 5.81 MIL/uL 5.23 5.03 5.64  HGB 13.0 - 17.0 g/dL 15.2 15.3 17.0  HCT 39.0 - 52.0 % 45.2 42.4 49.2  PLT 150 - 400 K/uL 142(L) 147(L) 137(L)  NEUTROABS 1,500 - 7,800 cells/uL - - 3,641  LYMPHSABS 850 - 3,900 cells/uL - - 1,276     There is no height or weight on file to calculate BMI.  Orders:  No orders of the defined types were placed in this encounter.  Meds ordered this encounter  Medications   sulfamethoxazole-trimethoprim (BACTRIM DS) 800-160 MG tablet    Sig: Take 1 tablet by mouth 2 (two) times daily.    Dispense:  60 tablet    Refill:  0     Procedures: No procedures performed  Clinical Data: No additional findings.  ROS:  All other systems negative, except as noted in the HPI. Review of Systems  Objective: Vital Signs: There were no vitals taken for this visit.  Specialty Comments:  No specialty comments available.  PMFS History: Patient Active Problem List   Diagnosis Date Noted   Non-pressure chronic ulcer of other part of right foot limited  to breakdown of skin ([place])    Contracture of right Achilles tendon    Mildly dilatd aortic root (HCC)    Contracture of left Achilles tendon    Metatarsal deformity, left    CAD (coronary artery disease)    Diarrhea 06/27/2019   Diastasis recti 48/18/5631   Umbilical hernia without obstruction and without gangrene 08/22/2018   Vitamin D deficiency 06/23/2017   Essential hypertension, benign 06/15/2017   Mixed hyperlipidemia 06/15/2017   Class 1 obesity due to excess calories with serious comorbidity and body mass index (BMI) of 31.0 to 31.9 in adult 06/15/2017   Malfunction of implantable cardioverter-defibrillator (ICD) electrode 10/07/2016   Chronic kidney disease (CKD), stage III (moderate) ([place]) 04/04/2015   Bladder neck obstruction  03/21/2015   Difficult or painful urination 03/21/2015   Flank pain 03/21/2015   Delayed onset of urination 03/21/2015   Calculus of kidney 03/21/2015   Erosive esophagitis 12/27/2013   Hypotension due to drugs 49/70/2637   Chronic systolic heart failure ([place]) 04/24/2013   CAP (community acquired pneumonia) 03/16/2013   HTN (hypertension) 03/15/2013   Hydrocele 03/15/2013   Spermatocele 03/15/2013   DOE (dyspnea on exertion) 03/15/2013   OSA (obstructive sleep apnea) 01/09/2013   Chronic kidney disease, stage 3 ([place]) 01/03/2013   Chest pain 12/26/2012   History of diagnostic tests 09/06/2012   TIA (transient ischemic attack)    Cardiomyopathy, nonischemic ([place]) 02/24/2010   AICD (automatic cardioverter/defibrillator) 02/24/2010   DM type 2 causing vascular disease ([place]) 06/27/2009   Gout 06/27/2009   Tobacco abuse, in remission 06/27/2009   COLONIC POLYPS 10/31/2008   GERD (gastroesophageal reflux disease) 10/31/2008   Past Medical History:  Diagnosis Date   AICD (automatic cardioverter/defibrillator) present 11/2007   a. 11/2007 SJM Current VR - single lead ICD  - Removed 2018 - "it was burning me"   Anxiety    CAD (coronary artery disease)    non-obstructive CAD by Cor CT in 2020 // Myoview 3/22: EF 57, small inf-sept defect c/w scar, no ischemia; low risk      Chest pain    a. 10/2007 Cath:  normal Cors.   CKD (chronic kidney disease), stage II    DDD (degenerative disc disease), lumbar    Diabetes mellitus DX: 2010   Erosive esophagitis    a. per EGD (08/2011), Dr. Laural Golden - Erosive reflux esophagitis improved but not completely healed since previous EGD 3 years ago. Bx showing  ulcerated gatroesophageal junction mucosa. negative for H. pylori   GERD (gastroesophageal reflux disease)    Gout    Hearing deficit    a. wear bilateral hearing aides   History of hiatal hernia    Hypertension    Mildly dilatd aortic root ([place])    CMR 4/22: EF 52, no LGE; d/w Dr. Marlowe Kays  root 38 mm (mildly dilated)   Myocardial infarction [facility]) 2011   Neuropathy    Feet and legs   Nonischemic dilated cardiomyopathy ([place])    a. H/O EF as low as 35-40% by LV gram 10/2007;  b. Echo 02/2011 EF 50-55%, inf HK, Gr 1 DD // CMR 4/22: EF 52, no LGE; d/w Dr. Marlowe Kays root 38 mm (mildly dilated)    Renal insufficiency    Sleep apnea    pt doesnt use, states"I cant afford one". PCP aware   Stroke ([place])    mini-stroke in 2014   TIA (transient ischemic attack)    July, 2013   Tobacco abuse, in remission 06/27/2009  Discontinued in 2009     Wears dentures    top plate   WPW (Wolff-Parkinson-White syndrome)    a. s/p RFCA @ [place]    Family History  Problem Relation Age of Onset   Heart attack Mother 80   Hypertension Mother    Diabetes Mother    Kidney disease Mother    Breast cancer Mother    Heart attack Father 51   Hypertension Father    Diabetes Father    Stroke Brother    Heart attack Brother 78   Stroke Maternal Grandmother 80   Diabetes Brother    Hypertension Brother     Past Surgical History:  Procedure Laterality Date   [place]     2009   CARDIAC DEFIBRILLATOR [place], LAPAROSCOPIC     11/2007   COLONOSCOPY W/ POLYPECTOMY  2009   ELBOW SURGERY Left 06/2010   ESOPHAGEAL DILATION N/A 11/08/2014   Procedure: ESOPHAGEAL DILATION;  Surgeon: Rogene Houston, MD;  Location: [facility];  Service: Endoscopy;  Laterality: N/A;  #56,    ESOPHAGOGASTRODUODENOSCOPY  03/31/2012   also 08/2011; [name]   ESOPHAGOGASTRODUODENOSCOPY (EGD) WITH PROPOFOL N/A 11/08/2014   Procedure: ESOPHAGOGASTRODUODENOSCOPY (EGD) WITH PROPOFOL;  Surgeon: Rogene Houston, MD;  [facility];  Service: Endoscopy;  Laterality: N/A;  [place] is 55 , GE Junction is 37   FOOT ARTHRODESIS Left 10/24/2020   Procedure: LEFT GASTROCNEMIUS RECESSION, DORSIFLEXION OSTEOTOMY 1ST MT;  Surgeon: [name], MD;  Location: [place];  Service: Orthopedics;  Laterality: Left;   FOOT ARTHRODESIS Right 01/09/2021   Procedure: CLOSING WEDGE OSTEOTOMY RIGHT 1ST METATARSAL;  Surgeon: [name], MD;  Location: [place];  Service: Orthopedics;  Laterality: Right;   GASTROCNEMIUS RECESSION Right 01/09/2021   Procedure: RIGHT GASTROCNEMIUS RECESSION;  Surgeon: [name], MD;  Location: [place];  Service: Orthopedics;  Laterality: Right;   ICD LEAD REMOVAL N/A 10/07/2016   Procedure: ICD LEAD REMOVAL ;  Surgeon: Evans Lance, MD;  Location: [place];  Service: Cardiovascular;  Laterality: N/A;   [place]; performed at [facility]   TEE WITHOUT CARDIOVERSION N/A 10/07/2016   Procedure: TRANSESOPHAGEAL ECHOCARDIOGRAM (TEE);  Surgeon: Evans Lance, MD;  Location: [place];  Service: Cardiovascular;  Laterality: N/A;   Social History   Occupational History   Occupation: Worked for [employer] [place]: Laid off in 11/11  Tobacco Use   Smoking status: Former    Packs/day: 1.50    Years: 35.00    Pack years: 52.50    Types: Cigarettes    Start date: 06/30/1971    Quit date: 07/30/2010    Years since quitting: 10.4   Smokeless tobacco: Former    Types: Chew    Quit date: 03/26/1994  Vaping Use   Vaping Use: Former   Start date: 07/30/2010   Quit date: 06/29/2016  Substance and Sexual Activity   Alcohol use: No    Alcohol/week: 1.0 standard drink    Types: 1 Cans of beer per week    Comment: used "years ago"   Drug use: No   Sexual activity: Never    Birth control/protection: None

## 2021-01-23 NOTE — Telephone Encounter (Signed)
Pts wife called and wanted to know if pt can remove boot while leg is supported since it is so hot. Please advise  CB # [phone]

## 2021-01-29 ENCOUNTER — Ambulatory Visit (INDEPENDENT_AMBULATORY_CARE_PROVIDER_SITE_OTHER): Payer: Medicare Other | Admitting: Physician Assistant

## 2021-01-29 ENCOUNTER — Other Ambulatory Visit: Payer: Self-pay | Admitting: Physician Assistant

## 2021-01-29 ENCOUNTER — Ambulatory Visit: Payer: Self-pay

## 2021-01-29 ENCOUNTER — Encounter: Payer: Self-pay | Admitting: Orthopedic Surgery

## 2021-01-29 DIAGNOSIS — M21961 Unspecified acquired deformity of right lower leg: Secondary | ICD-10-CM | POA: Diagnosis not present

## 2021-01-29 NOTE — Progress Notes (Signed)
Office Visit Note   Patient: Mike Floyd           Date of Birth: June 16, 1961           MRN: [id number] Visit Date: 01/29/2021              Requested by: Redmond School, MD [address] [place],  [place] [address] PCP: Redmond School, MD  Chief Complaint  Patient presents with   Right Foot - Routine Post Op      HPI: Patient is a pleasant 60 year old gentleman who is 3 weeks status post closing wedge osteotomy right first metatarsal.  He also status post Achilles release.  He is been minimal weightbearing with crutches.  At his last visit he had some erythema in the leg we placed him on some antibiotics this seems to have resolved.    Assessment & Plan: Visit Diagnoses:  1. Metatarsal deformity, right     Plan: Have placed him in a large 2030 medical compression stocking.  His wife is going to see if they can have their son order some more.  I cautioned him of been scratching his leg.  Follow-up in 3 weeks.  He may begin weightbearing in 1 week in the boot  Follow-Up Instructions: No follow-ups on file.   Ortho Exam  Patient is alert, oriented, no adenopathy, well-dressed, normal affect, normal respiratory effort. Examination foot wound is healed and sutures have been previously harvested.  Swelling is controlled there is no erythema.  Calf wound is also healed with just some very superficial necrosis the surgical sutures were removed today there is no ascending cellulitis or signs of infection.  He does have a lot of scratch marks and skin abrasions from constantly scratching his leg.  Compartments are soft and compressible no ascending cellulitis  Imaging: No results found.   Labs: Lab Results  Component Value Date   HGBA1C 7.0 (H) 06/15/2017   HGBA1C 5.2 03/15/2013   HGBA1C 6.5 (H) 03/07/2012   CRP 2.2 06/27/2019   LABURIC 5.5 03/07/2012   REPTSTATUS 02/21/2008 FINAL 02/19/2008   CULT  02/19/2008    Multiple bacterial morphotypes present, none  predominant. Suggest appropriate recollection if clinically indicated.     Lab Results  Component Value Date   ALBUMIN 4.6 12/27/2013   ALBUMIN 4.4 05/30/2013   ALBUMIN 4.2 03/16/2013    Lab Results  Component Value Date   MG 1.7 09/05/2012   MG 1.9 06/30/2009   MG 1.9 06/30/2009   Lab Results  Component Value Date   VD25OH 18 (L) 06/15/2017    No results found for: PREALBUMIN CBC EXTENDED Latest Ref Rng & Units 01/09/2021 10/24/2020 06/27/2019  WBC 4.0 - 10.5 K/uL 6.6 6.4 5.5  RBC 4.22 - 5.81 MIL/uL 5.23 5.03 5.64  HGB 13.0 - 17.0 g/dL 15.2 15.3 17.0  HCT 39.0 - 52.0 % 45.2 42.4 49.2  PLT 150 - 400 K/uL 142(L) 147(L) 137(L)  NEUTROABS 1,500 - 7,800 cells/uL - - 3,641  LYMPHSABS 850 - 3,900 cells/uL - - 1,276     There is no height or weight on file to calculate BMI.  Orders:  Orders Placed This Encounter  Procedures   XR Foot Complete Right   No orders of the defined types were placed in this encounter.    Procedures: No procedures performed  Clinical Data: No additional findings.  ROS:  All other systems negative, except as noted in the HPI. Review of Systems  Objective: Vital Signs: There were  no vitals taken for this visit.  Specialty Comments:  No specialty comments available.  PMFS History: Patient Active Problem List   Diagnosis Date Noted   Non-pressure chronic ulcer of other part of right foot limited to breakdown of skin ([place])    Contracture of right Achilles tendon    Mildly dilatd aortic root (HCC)    Contracture of left Achilles tendon    Metatarsal deformity, left    CAD (coronary artery disease)    Diarrhea 06/27/2019   Diastasis recti 46/56/8127   Umbilical hernia without obstruction and without gangrene 08/22/2018   Vitamin D deficiency 06/23/2017   Essential hypertension, benign 06/15/2017   Mixed hyperlipidemia 06/15/2017   Class 1 obesity due to excess calories with serious comorbidity and body mass index (BMI) of 31.0 to  31.9 in adult 06/15/2017   Malfunction of implantable cardioverter-defibrillator (ICD) electrode 10/07/2016   Chronic kidney disease (CKD), stage III (moderate) ([place]) 04/04/2015   Bladder neck obstruction 03/21/2015   Difficult or painful urination 03/21/2015   Flank pain 03/21/2015   Delayed onset of urination 03/21/2015   Calculus of kidney 03/21/2015   Erosive esophagitis 12/27/2013   Hypotension due to drugs 51/70/0174   Chronic systolic heart failure ([place]) 04/24/2013   CAP (community acquired pneumonia) 03/16/2013   HTN (hypertension) 03/15/2013   Hydrocele 03/15/2013   Spermatocele 03/15/2013   DOE (dyspnea on exertion) 03/15/2013   OSA (obstructive sleep apnea) 01/09/2013   Chronic kidney disease, stage 3 ([place]) 01/03/2013   Chest pain 12/26/2012   History of diagnostic tests 09/06/2012   TIA (transient ischemic attack)    Cardiomyopathy, nonischemic ([place]) 02/24/2010   AICD (automatic cardioverter/defibrillator) 02/24/2010   DM type 2 causing vascular disease ([place]) 06/27/2009   Gout 06/27/2009   Tobacco abuse, in remission 06/27/2009   COLONIC POLYPS 10/31/2008   GERD (gastroesophageal reflux disease) 10/31/2008   Past Medical History:  Diagnosis Date   AICD (automatic cardioverter/defibrillator) present 11/2007   a. 11/2007 SJM Current VR - single lead ICD  - Removed 2018 - "it was burning me"   Anxiety    CAD (coronary artery disease)    non-obstructive CAD by Cor CT in 2020 // Myoview 3/22: EF 57, small inf-sept defect c/w scar, no ischemia; low risk      Chest pain    a. 10/2007 Cath:  normal Cors.   CKD (chronic kidney disease), stage II    DDD (degenerative disc disease), lumbar    Diabetes mellitus DX: 2010   Erosive esophagitis    a. per EGD (08/2011), Dr. Laural Golden - Erosive reflux esophagitis improved but not completely healed since previous EGD 3 years ago. Bx showing  ulcerated gatroesophageal junction mucosa. negative for H. pylori   GERD (gastroesophageal  reflux disease)    Gout    Hearing deficit    a. wear bilateral hearing aides   History of hiatal hernia    Hypertension    Mildly dilatd aortic root ([place])    CMR 4/22: EF 52, no LGE; d/w Dr. Marlowe Kays root 38 mm (mildly dilated)   Myocardial infarction [facility]) 2011   Neuropathy    Feet and legs   Nonischemic dilated cardiomyopathy ([place])    a. H/O EF as low as 35-40% by LV gram 10/2007;  b. Echo 02/2011 EF 50-55%, inf HK, Gr 1 DD // CMR 4/22: EF 52, no LGE; d/w Dr. Marlowe Kays root 38 mm (mildly dilated)    Renal insufficiency    Sleep apnea  pt doesnt use, states"I cant afford one". PCP aware   Stroke ([place])    mini-stroke in 2014   TIA (transient ischemic attack)    July, 2013   Tobacco abuse, in remission 06/27/2009   Discontinued in 2009     Wears dentures    top plate   WPW (Wolff-Parkinson-White syndrome)    a. s/p RFCA @ [facility]    Family History  Problem Relation Age of Onset   Heart attack Mother 95   Hypertension Mother    Diabetes Mother    Kidney disease Mother    Breast cancer Mother    Heart attack Father 64   Hypertension Father    Diabetes Father    Stroke Brother    Heart attack Brother 81   Stroke Maternal Grandmother 80   Diabetes Brother    Hypertension Brother     Past Surgical History:  Procedure Laterality Date   [place]     2009   CARDIAC DEFIBRILLATOR [place], LAPAROSCOPIC     11/2007   COLONOSCOPY W/ POLYPECTOMY  2009   ELBOW SURGERY Left 06/2010   ESOPHAGEAL DILATION N/A 11/08/2014   Procedure: ESOPHAGEAL DILATION;  Surgeon: Rogene Houston, MD;  Location: [facility];  Service: Endoscopy;  Laterality: N/A;  #56,    ESOPHAGOGASTRODUODENOSCOPY  03/31/2012   also 08/2011; [name]   ESOPHAGOGASTRODUODENOSCOPY (EGD) WITH PROPOFOL N/A 11/08/2014   Procedure: ESOPHAGOGASTRODUODENOSCOPY (EGD) WITH PROPOFOL;  Surgeon: Rogene Houston, MD;  Location:  [facility];  Service: Endoscopy;  Laterality: N/A;  [place] is 43 , [place] is 35   FOOT ARTHRODESIS Left 10/24/2020   Procedure: LEFT GASTROCNEMIUS RECESSION, DORSIFLEXION OSTEOTOMY 1ST MT;  Surgeon: [name], MD;  Location: [place];  Service: Orthopedics;  Laterality: Left;   FOOT ARTHRODESIS Right 01/09/2021   Procedure: CLOSING WEDGE OSTEOTOMY RIGHT 1ST METATARSAL;  Surgeon: [name], MD;  Location: [place];  Service: Orthopedics;  Laterality: Right;   GASTROCNEMIUS RECESSION Right 01/09/2021   Procedure: RIGHT GASTROCNEMIUS RECESSION;  Surgeon: [name], MD;  Location: [place];  Service: Orthopedics;  Laterality: Right;   ICD LEAD REMOVAL N/A 10/07/2016   Procedure: ICD LEAD REMOVAL ;  Surgeon: Evans Lance, MD;  Location: [place];  Service: Cardiovascular;  Laterality: N/A;   [place]; performed at [facility]   TEE WITHOUT CARDIOVERSION N/A 10/07/2016   Procedure: TRANSESOPHAGEAL ECHOCARDIOGRAM (TEE);  Surgeon: Evans Lance, MD;  Location: [place];  Service: Cardiovascular;  Laterality: N/A;   Social History   Occupational History   Occupation: Worked for [employer]: [employer]: Laid off in 11/11  Tobacco Use   Smoking status: Former    Packs/day: 1.50    Years: 35.00    Pack years: 52.50    Types: Cigarettes    Start date: 06/30/1971    Quit date: 07/30/2010    Years since quitting: 10.5   Smokeless tobacco: Former    Types: Chew    Quit date: 03/26/1994  Vaping Use   Vaping Use: Former   Start date: 07/30/2010   Quit date: 06/29/2016  Substance and Sexual Activity   Alcohol use: No    Alcohol/week: 1.0 standard drink    Types: 1 Cans  of beer per week    Comment: used "years ago"   Drug use: No   Sexual activity: Never    Birth control/protection: None

## 2021-01-30 NOTE — Progress Notes (Signed)
Cardiology Office Note:    Date:  02/11/2021   ID:  Mike Floyd, DOB 23-Sep-1960, MRN [id number]  PCP:  Redmond School, [place]  Cardiologist:   New to me  Advanced Practice Provider: None Electrophysiologist:  Cristopher Peru, MD       Referring MD: Redmond School, MD   Chief Complaint:  No chief complaint on file.    Patient Profile:    Mike Floyd is a 60 y.o. male with:  HFmrEF (heart failure with mildly reduced ejection fraction)  Non-ischemic cardiomyopathy  EF previously recovered; decreased again in  Echocardiogram 01/2019: EF 40-45  Coronary artery disease  CT 01/2019: mild to mod nonobstructive plaque Cath 2009: no CAD  S/p ICD with subsequent extraction  WPW s/p RFA in 1999 ([facility]) Hx of CVA in 2014; TIA in 2013 Diabetes mellitus  Neuropathy  Hypertension  Chronic kidney disease  Erosive esophagitis, GERD  OSA Peripheral arterial disease  ABIs with L>R tibial disease (06/2019) Dilated Sinus of Valsalva (45 mm by CT in 01/2019)  Prior CV studies: Coronary CTA 01/31/2019 Ca score 45 RCA min plaque < 25 LM min plaque < 25 LAD mild plaque mid 25-49 LCx prox mild plaque 25-49 SoV dilation (45 mm)  Echocardiogram 01/08/2019 EF 40-45, mild conc LVH, Gr 1 DD  TEE 10/07/16 EF 30-35  Myoview 08/30/16 EF 60, diaph att, no sig ischemia; low risk  Echocardiogram 08/05/16 EF 55-60  Carotid US 03/09/12 No ICA stenosis    History of Present Illness:    60 y.o. new to me. Referred by Richardson Dopp to establish general cardiology care for his CAD. He has a history of non ischemic DCM as far back as 2009 He had an AICD placed by Dr Lovena Le in 2009 but removed in 2018. Cardiac CTA  01/31/19 with no obstructive disease and calcium score of 45 which was 58 percentile. He had non ischemic myovue 10/13/20 to clear for foot surgery / ulcers with Dr Sharol Given. ? Apical scar but f/u cardiac mRI 11/17/20 showed no gadolinium enhancement and normal LV EF  52%, normal RVEF 52%.  He has had dilated aortic sinuses but no AR on TTE 01/08/19.  He subsequently had wedge osteotomy of first right metatarsal by Dr Sharol Given on 01/09/21 for diabetic ulcer with no cardiac complications. He has poor functional status and sleeps on 3 pillows He has had ABI's 06/14/19 showing normal ratio and only tibial small vessel disease  No cardiac complaints Has RLE in boot LLE has healed well    Past Medical History:  Diagnosis Date   AICD (automatic cardioverter/defibrillator) present 11/2007   a. 11/2007 SJM Current VR - single lead ICD  - Removed 2018 - "it was burning me"   Anxiety    CAD (coronary artery disease)    non-obstructive CAD by Cor CT in 2020 // Myoview 3/22: EF 57, small inf-sept defect c/w scar, no ischemia; low risk      Chest pain    a. 10/2007 Cath:  normal Cors.   CKD (chronic kidney disease), stage II    DDD (degenerative disc disease), lumbar    Diabetes mellitus DX: 2010   Erosive esophagitis    a. per EGD (08/2011), Dr. Laural Golden - Erosive reflux esophagitis improved but not completely healed since previous EGD 3 years ago. Bx showing  ulcerated gatroesophageal junction mucosa. negative for H. pylori   GERD (gastroesophageal reflux disease)    Gout    Hearing  deficit    a. wear bilateral hearing aides   History of hiatal hernia    Hypertension    Mildly dilatd aortic root ([place])    CMR 4/22: EF 52, no LGE; d/w Dr. Marlowe Kays root 38 mm (mildly dilated)   Myocardial infarction [facility]) 2011   Neuropathy    Feet and legs   Nonischemic dilated cardiomyopathy ([place])    a. H/O EF as low as 35-40% by LV gram 10/2007;  b. Echo 02/2011 EF 50-55%, inf HK, Gr 1 DD // CMR 4/22: EF 52, no LGE; d/w Dr. Marlowe Kays root 38 mm (mildly dilated)    Renal insufficiency    Sleep apnea    pt doesnt use, states"I cant afford one". PCP aware   Stroke ([place])    mini-stroke in 2014   TIA (transient ischemic attack)    July, 2013   Tobacco abuse, in remission 06/27/2009    Discontinued in 2009     Wears dentures    top plate   WPW (Wolff-Parkinson-White syndrome)    a. s/p RFCA @ [facility] - 1999    Current Medications: Current Meds  Medication Sig   acarbose (PRECOSE) 100 MG tablet Take 100 mg by mouth 3 (three) times daily.   amitriptyline (ELAVIL) 50 MG tablet Take 50 mg by mouth at bedtime.   aspirin 81 MG tablet Take 81 mg by mouth daily.   atorvastatin (LIPITOR) 20 MG tablet Take 20 mg by mouth daily.   B-D ULTRAFINE III SHORT PEN 31G X 8 MM MISC USE WITH TRESBIA.   calcium carbonate (OSCAL) 1500 (600 Ca) MG TABS tablet Take 600 mg of elemental calcium by mouth daily with breakfast.   carvedilol (COREG) 12.5 MG tablet Take 1 tablet (12.5 mg total) by mouth 2 (two) times daily.   famotidine (PEPCID) 20 MG tablet Take 20 mg by mouth daily.   FARXIGA 10 MG TABS tablet Take 10 mg by mouth daily.   furosemide (LASIX) 40 MG tablet TAKE (1) TABLET BY MOUTH ONCE DAILY. (Patient taking differently: Take 40 mg by mouth daily.)   gabapentin (NEURONTIN) 600 MG tablet Take 300 mg by mouth 3 (three) times daily.   glimepiride (AMARYL) 4 MG tablet Take 4 mg by mouth 2 (two) times daily.    insulin degludec (TRESIBA FLEXTOUCH) 100 UNIT/ML SOPN FlexTouch Pen Inject 0.3 mLs (30 Units total) into the skin at bedtime. (Patient taking differently: Inject 52 Units into the skin daily. 50 - 80  based on CBG   AM)   isosorbide mononitrate (IMDUR) 30 MG 24 hr tablet TAKE (1) TABLET BY MOUTH ONCE DAILY. (Patient taking differently: Take 30 mg by mouth daily.)   LORazepam (ATIVAN) 1 MG tablet Take 1 mg by mouth at bedtime.   nitroGLYCERIN (NITROSTAT) 0.4 MG SL tablet Place 0.4 mg under the tongue every 5 (five) minutes as needed for chest pain.   omeprazole (PRILOSEC) 40 MG capsule TAKE (1) CAPSULE BY MOUTH ONCE DAILY.   ONETOUCH VERIO test strip TESTING 4 TIMES DAILY.   oxycodone (ROXICODONE) 30 MG immediate release tablet Take 30-60 mg by mouth 4 (four) times daily as needed  for pain.   oxyCODONE-acetaminophen (PERCOCET) 5-325 MG tablet Take 1 tablet by mouth every 4 (four) hours as needed.   potassium chloride SA (KLOR-CON) 20 MEQ tablet TAKE (1) TABLET BY MOUTH ONCE DAILY. (Patient taking differently: Take 20 mEq by mouth daily.)   silver sulfADIAZINE (SILVADENE) 1 % cream Apply 1 application topically daily.  sucralfate (CARAFATE) 1 g tablet TAKE 1 TABLET BY MOUTH 30 MINTUES BEFORE MEALS AND AT BEDTIME. (Patient taking differently: Take by mouth 4 (four) times daily -  with meals and at bedtime. TAKE 1 TABLET BY MOUTH 30 MINTUES BEFORE MEALS AND AT BEDTIME.)   sulfamethoxazole-trimethoprim (BACTRIM DS) 800-160 MG tablet Take 1 tablet by mouth 2 (two) times daily.   tamsulosin (FLOMAX) 0.4 MG CAPS capsule Take 0.4 mg by mouth daily. am   vitamin B-12 (CYANOCOBALAMIN) 1000 MCG tablet Take 1,000 mcg by mouth daily.     Allergies:   Patient has no known allergies.   Social History   Tobacco Use   Smoking status: Former    Packs/day: 1.50    Years: 35.00    Pack years: 52.50    Types: Cigarettes    Start date: 06/30/1971    Quit date: 07/30/2010    Years since quitting: 10.5   Smokeless tobacco: Former    Types: Chew    Quit date: 03/26/1994  Vaping Use   Vaping Use: Former   Start date: 07/30/2010   Quit date: 06/29/2016  Substance Use Topics   Alcohol use: No    Alcohol/week: 1.0 standard drink    Types: 1 Cans of beer per week    Comment: used "years ago"   Drug use: No     Family Hx: The patient's family history includes Breast cancer in his mother; Diabetes in his brother, father, and mother; Heart attack (age of onset: 38) in his brother and father; Heart attack (age of onset: 19) in his mother; Hypertension in his brother, father, and mother; Kidney disease in his mother; Stroke in his brother; Stroke (age of onset: 37) in his maternal grandmother.  ROS   EKGs/Labs/Other Test Reviewed:    EKG:   10/08/20 SR rate 84 small Q waves lateral  leads   Recent Labs: 01/09/2021: BUN 11; Creatinine, Ser 1.55; Hemoglobin 15.2; Platelets 142; Potassium 3.9; Sodium 137   Recent Lipid Panel Lab Results  Component Value Date/Time   CHOL 151 03/15/2013 09:02 AM   TRIG 446 (H) 03/15/2013 09:02 AM   HDL 27 (L) 03/15/2013 09:02 AM   CHOLHDL 5.6 03/15/2013 09:02 AM   LDLCALC UNABLE TO CALCULATE IF TRIGLYCERIDE OVER 400 mg/dL 03/15/2013 09:02 AM      Risk Assessment/Calculations:      Physical Exam:    VS:  BP 98/62   Pulse 90   Ht 6\' 1"  (1.854 m)   Wt 102.2 kg   SpO2 98%   BMI 29.74 kg/m     Wt Readings from Last 3 Encounters:  02/11/21 102.2 kg  01/09/21 100.7 kg  10/24/20 103.3 kg    Affect appropriate Chronically ill white male  HEENT: normal Neck supple with no adenopathy JVP normal no bruits no thyromegaly Lungs clear with no wheezing and good diaphragmatic motion Heart:  S1/S2 no murmur, no rub, gallop or click PMI normal Abdomen: benighn, BS positve, no tenderness, no AAA no bruit.  No HSM or HJR Distal pulses intact with no bruits Neuro non-focal Post right foot transmet amputation in boot still      ASSESSMENT & PLAN:    1. CAD:  Non ischemic myovue 10/13/20 with no scar on MRI scan 11/17/20 No obstructive CAD on cardiac CTA 01/31/19 continue risk factor modification ASA and statin     2. DCM:  Non ischemic with normalization of EF by MRI 11/17/20 continue Medical RX  AICD has been removed  4. Enlarged sinus of Valsalva, acquired ([place])- no mention on recent cardiac MRI 4.2 cm by prior CT with normal root and no significant AR By TEE observe    5. Stage 3a chronic kidney disease ([place]) Most recent creatinine 1.55 01/09/21   6. Essential hypertension:  Well controlled.  Continue current medications and low sodium Dash type diet.     Medication Adjustments/Labs and Tests Ordered: Current medicines are reviewed at length with the patient today.  Concerns regarding medicines are outlined above.  Tests  Ordered: No orders of the defined types were placed in this encounter.  Medication Changes: No orders of the defined types were placed in this encounter.    F/U in a year  Time: spent reviewing prior records, cardiac CTA, cardiac MRI, TTE nuclear study and notes from Dr Lovena Le / PA 110 minutes Additional time for patient interview, exam and composing note. Complex patient   Jenkins Rouge MD [facility]

## 2021-02-11 ENCOUNTER — Other Ambulatory Visit: Payer: Self-pay

## 2021-02-11 ENCOUNTER — Ambulatory Visit: Payer: Medicare Other | Admitting: Cardiovascular Disease

## 2021-02-11 VITALS — BP 98/62 | HR 90 | Ht 73.0 in | Wt 225.4 lb

## 2021-02-11 DIAGNOSIS — N1831 Chronic kidney disease, stage 3a: Secondary | ICD-10-CM

## 2021-02-11 DIAGNOSIS — I2510 Atherosclerotic heart disease of native coronary artery without angina pectoris: Secondary | ICD-10-CM | POA: Diagnosis not present

## 2021-02-11 DIAGNOSIS — I420 Dilated cardiomyopathy: Secondary | ICD-10-CM

## 2021-02-11 DIAGNOSIS — I10 Essential (primary) hypertension: Secondary | ICD-10-CM | POA: Diagnosis not present

## 2021-02-11 NOTE — Patient Instructions (Signed)
Medication Instructions:  Your physician recommends that you continue on your current medications as directed. Please refer to the Current Medication list given to you today.  *If you need a refill on your cardiac medications before your next appointment, please call your pharmacy*   Follow-Up: At [facility], you and your health needs are our priority.  As part of our continuing mission to provide you with exceptional heart care, we have created designated Provider Care Teams.  These Care Teams include your primary Cardiologist (physician) and Advanced Practice Providers (APPs -  Physician Assistants and Nurse Practitioners) who all work together to provide you with the care you need, when you need it.  We recommend signing up for the patient portal called "MyChart".  Sign up information is provided on this After Visit Summary.  MyChart is used to connect with patients for Virtual Visits (Telemedicine).  Patients are able to view lab/test results, encounter notes, upcoming appointments, etc.  Non-urgent messages can be sent to your provider as well.   To learn more about what you can do with MyChart, go to [email or web address].    Your next appointment:   1 year(s)  The format for your next appointment:   In Person  Provider:   Jenkins Rouge, MD

## 2021-02-20 ENCOUNTER — Ambulatory Visit (INDEPENDENT_AMBULATORY_CARE_PROVIDER_SITE_OTHER): Payer: Medicare Other | Admitting: Physician Assistant

## 2021-02-20 ENCOUNTER — Encounter: Payer: Self-pay | Admitting: Physician Assistant

## 2021-02-20 DIAGNOSIS — M216X9 Other acquired deformities of unspecified foot: Secondary | ICD-10-CM

## 2021-02-20 NOTE — Progress Notes (Signed)
Office Visit Note   Patient: Mike Floyd           Date of Birth: July 17, 1961           MRN: [id number] Visit Date: 02/20/2021              Requested by: Redmond School, [place],  [place] [id number] PCP: Redmond School, MD  Chief Complaint  Patient presents with   Right Foot - Follow-up      HPI: Patient presents today 6 weeks status post closing wedge osteotomy of the right first metatarsal.  He has been in his boot and gradually increasing weightbearing for the last couple weeks.  He has no complaints.  He has been wearing a compression sock as he was having quite a bit of scratches at his last visit.  These he thinks have gotten better with the sock  Assessment & Plan: Visit Diagnoses: No diagnosis found.  Plan: He may slowly wean out of the boot and wean off the crutches as tolerated.  I recommend a shoe with a good arch support.  Follow-up for final visit in 1 month.  He will continue to wear the sock also encouraged use of cocoa butter on the bottom of his foot.  Follow-Up Instructions: No follow-ups on file.   Ortho Exam  Patient is alert, oriented, no adenopathy, well-dressed, normal affect, normal respiratory effort. Examination of his foot mild soft tissue swelling well-healed surgical incision.  Nontender over the incision.  Well-maintained alignment.  Good dorsiflexion and plantarflexion.  Pulses are palpable no signs of infection he has good dorsiflexion past neutral good plantarflexion strength  Imaging: No results found. No images are attached to the encounter.  Labs: Lab Results  Component Value Date   HGBA1C 7.0 (H) 06/15/2017   HGBA1C 5.2 03/15/2013   HGBA1C 6.5 (H) 03/07/2012   CRP 2.2 06/27/2019   LABURIC 5.5 03/07/2012   REPTSTATUS 02/21/2008 FINAL 02/19/2008   CULT  02/19/2008    Multiple bacterial morphotypes present, none predominant. Suggest appropriate recollection if clinically indicated.     Lab Results   Component Value Date   ALBUMIN 4.6 12/27/2013   ALBUMIN 4.4 05/30/2013   ALBUMIN 4.2 03/16/2013    Lab Results  Component Value Date   MG 1.7 09/05/2012   MG 1.9 06/30/2009   MG 1.9 06/30/2009   Lab Results  Component Value Date   VD25OH 18 (L) 06/15/2017    No results found for: PREALBUMIN CBC EXTENDED Latest Ref Rng & Units 01/09/2021 10/24/2020 06/27/2019  WBC 4.0 - 10.5 K/uL 6.6 6.4 5.5  RBC 4.22 - 5.81 MIL/uL 5.23 5.03 5.64  HGB 13.0 - 17.0 g/dL 15.2 15.3 17.0  HCT 39.0 - 52.0 % 45.2 42.4 49.2  PLT 150 - 400 K/uL 142(L) 147(L) 137(L)  NEUTROABS 1,500 - 7,800 cells/uL - - 3,641  LYMPHSABS 850 - 3,900 cells/uL - - 1,276     There is no height or weight on file to calculate BMI.  Orders:  No orders of the defined types were placed in this encounter.  No orders of the defined types were placed in this encounter.    Procedures: No procedures performed  Clinical Data: No additional findings.  ROS:  All other systems negative, except as noted in the HPI. Review of Systems  Objective: Vital Signs: There were no vitals taken for this visit.  Specialty Comments:  No specialty comments available.  PMFS History: Patient Active Problem List  Diagnosis Date Noted   Non-pressure chronic ulcer of other part of right foot limited to breakdown of skin ([place])    Contracture of right Achilles tendon    Mildly dilatd aortic root ([facility])    Contracture of left Achilles tendon    Metatarsal deformity, left    CAD (coronary artery disease)    Diarrhea 06/27/2019   Diastasis recti 93/81/8299   Umbilical hernia without obstruction and without gangrene 08/22/2018   Vitamin D deficiency 06/23/2017   Essential hypertension, benign 06/15/2017   Mixed hyperlipidemia 06/15/2017   Class 1 obesity due to excess calories with serious comorbidity and body mass index (BMI) of 31.0 to 31.9 in adult 06/15/2017   Malfunction of implantable cardioverter-defibrillator (ICD) electrode  10/07/2016   Chronic kidney disease (CKD), stage III (moderate) ([place]) 04/04/2015   Bladder neck obstruction 03/21/2015   Difficult or painful urination 03/21/2015   Flank pain 03/21/2015   Delayed onset of urination 03/21/2015   Calculus of kidney 03/21/2015   Erosive esophagitis 12/27/2013   Hypotension due to drugs 37/16/9678   Chronic systolic heart failure ([place]) 04/24/2013   CAP (community acquired pneumonia) 03/16/2013   HTN (hypertension) 03/15/2013   Hydrocele 03/15/2013   Spermatocele 03/15/2013   DOE (dyspnea on exertion) 03/15/2013   OSA (obstructive sleep apnea) 01/09/2013   Chronic kidney disease, stage 3 ([place]) 01/03/2013   Chest pain 12/26/2012   History of diagnostic tests 09/06/2012   TIA (transient ischemic attack)    Cardiomyopathy, nonischemic ([place]) 02/24/2010   AICD (automatic cardioverter/defibrillator) 02/24/2010   DM type 2 causing vascular disease ([place]) 06/27/2009   Gout 06/27/2009   Tobacco abuse, in remission 06/27/2009   COLONIC POLYPS 10/31/2008   GERD (gastroesophageal reflux disease) 10/31/2008   Past Medical History:  Diagnosis Date   AICD (automatic cardioverter/defibrillator) present 11/2007   a. 11/2007 SJM Current VR - single lead ICD  - Removed 2018 - "it was burning me"   Anxiety    CAD (coronary artery disease)    non-obstructive CAD by Cor CT in 2020 // Myoview 3/22: EF 57, small inf-sept defect c/w scar, no ischemia; low risk      Chest pain    a. 10/2007 Cath:  normal Cors.   CKD (chronic kidney disease), stage II    DDD (degenerative disc disease), lumbar    Diabetes mellitus DX: 2010   Erosive esophagitis    a. per EGD (08/2011), Dr. Laural Golden - Erosive reflux esophagitis improved but not completely healed since previous EGD 3 years ago. Bx showing  ulcerated gatroesophageal junction mucosa. negative for H. pylori   GERD (gastroesophageal reflux disease)    Gout    Hearing deficit    a. wear bilateral hearing aides   History of hiatal  hernia    Hypertension    Mildly dilatd aortic root ([place])    CMR 4/22: EF 52, no LGE; d/w Dr. Marlowe Kays root 38 mm (mildly dilated)   Myocardial infarction [facility]) 2011   Neuropathy    Feet and legs   Nonischemic dilated cardiomyopathy ([place])    a. H/O EF as low as 35-40% by LV gram 10/2007;  b. Echo 02/2011 EF 50-55%, inf HK, Gr 1 DD // CMR 4/22: EF 52, no LGE; d/w Dr. Marlowe Kays root 38 mm (mildly dilated)    Renal insufficiency    Sleep apnea    pt doesnt use, states"I cant afford one". PCP aware   Stroke ([place])    mini-stroke in 2014   TIA (transient  ischemic attack)    July, 2013   Tobacco abuse, in remission 06/27/2009   Discontinued in 2009     Wears dentures    top plate   WPW (Wolff-Parkinson-White syndrome)    a. s/p RFCA @ [place]    Family History  Problem Relation Age of Onset   Heart attack Mother 31   Hypertension Mother    Diabetes Mother    Kidney disease Mother    Breast cancer Mother    Heart attack Father 73   Hypertension Father    Diabetes Father    Stroke Brother    Heart attack Brother 56   Stroke Maternal Grandmother 80   Diabetes Brother    Hypertension Brother     Past Surgical History:  Procedure Laterality Date   [place]     2009   CARDIAC DEFIBRILLATOR [place], LAPAROSCOPIC     11/2007   COLONOSCOPY W/ POLYPECTOMY  2009   ELBOW SURGERY Left 06/2010   ESOPHAGEAL DILATION N/A 11/08/2014   Procedure: ESOPHAGEAL DILATION;  Surgeon: Rogene Houston, MD;  Location: [facility];  Service: Endoscopy;  Laterality: N/A;  #56,    ESOPHAGOGASTRODUODENOSCOPY  03/31/2012   also 08/2011; [name]   ESOPHAGOGASTRODUODENOSCOPY (EGD) WITH PROPOFOL N/A 11/08/2014   Procedure: ESOPHAGOGASTRODUODENOSCOPY (EGD) WITH PROPOFOL;  Surgeon: Rogene Houston, MD;  Location: [facility];  Service: Endoscopy;  Laterality: N/A;  Hiatus is 32 , GE Junction is 37   FOOT ARTHRODESIS  Left 10/24/2020   Procedure: LEFT GASTROCNEMIUS RECESSION, DORSIFLEXION OSTEOTOMY 1ST MT;  Surgeon: [name], MD;  Location: [facility];  Service: Orthopedics;  Laterality: Left;   FOOT ARTHRODESIS Right 01/09/2021   Procedure: CLOSING WEDGE OSTEOTOMY RIGHT 1ST METATARSAL;  Surgeon: [name], MD;  Location: [facility];  Service: Orthopedics;  Laterality: Right;   GASTROCNEMIUS RECESSION Right 01/09/2021   Procedure: RIGHT GASTROCNEMIUS RECESSION;  Surgeon: [name], MD;  Location: [facility];  Service: Orthopedics;  Laterality: Right;   ICD LEAD REMOVAL N/A 10/07/2016   Procedure: ICD LEAD REMOVAL ;  Surgeon: Evans Lance, MD;  Location: [facility];  Service: Cardiovascular;  Laterality: N/A;   [place]; performed at [facility]   TEE WITHOUT CARDIOVERSION N/A 10/07/2016   Procedure: TRANSESOPHAGEAL ECHOCARDIOGRAM (TEE);  Surgeon: Evans Lance, MD;  Location: [place];  Service: Cardiovascular;  Laterality: N/A;   Social History   Occupational History   Occupation: Worked for [employer]: [place]: Laid off in 11/11  Tobacco Use   Smoking status: Former    Packs/day: 1.50    Years: 35.00    Pack years: 52.50    Types: Cigarettes    Start date: 06/30/1971    Quit date: 07/30/2010    Years since quitting: 10.5   Smokeless tobacco: Former    Types: Chew    Quit date: 03/26/1994  Vaping Use   Vaping Use: Former   Start date: 07/30/2010   Quit date: 06/29/2016  Substance and Sexual Activity   Alcohol use: No    Alcohol/week: 1.0 standard drink    Types: 1 Cans of beer per week    Comment: used "years ago"   Drug use: No   Sexual activity: Never  Birth control/protection: None

## 2021-02-27 ENCOUNTER — Other Ambulatory Visit: Payer: Self-pay | Admitting: Student

## 2021-03-08 DIAGNOSIS — I10 Essential (primary) hypertension: Secondary | ICD-10-CM | POA: Diagnosis not present

## 2021-03-08 DIAGNOSIS — E119 Type 2 diabetes mellitus without complications: Secondary | ICD-10-CM | POA: Diagnosis not present

## 2021-03-24 ENCOUNTER — Encounter: Payer: Self-pay | Admitting: Physician Assistant

## 2021-03-24 ENCOUNTER — Ambulatory Visit: Payer: Self-pay

## 2021-03-24 ENCOUNTER — Ambulatory Visit (INDEPENDENT_AMBULATORY_CARE_PROVIDER_SITE_OTHER): Payer: Medicare Other | Admitting: Physician Assistant

## 2021-03-24 ENCOUNTER — Other Ambulatory Visit: Payer: Self-pay

## 2021-03-24 DIAGNOSIS — M79674 Pain in right toe(s): Secondary | ICD-10-CM

## 2021-03-24 DIAGNOSIS — G8929 Other chronic pain: Secondary | ICD-10-CM

## 2021-03-24 MED ORDER — DOXYCYCLINE HYCLATE 100 MG PO TABS: 100.0000 mg | ORAL_TABLET | Freq: Two times a day (BID) | ORAL | 0 refills | Status: DC

## 2021-03-24 NOTE — Progress Notes (Signed)
Office Visit Note   Patient: Mike Floyd           Date of Birth: [birth year]           MRN: [id number] Visit Date: 03/24/2021              Requested by: [address], [place],  [place] [id number] PCP: Redmond School, MD  Chief Complaint  Patient presents with   Right Foot - Routine Post Op    01/09/21 closing wedge osteotomy right 1 st MT    Left Foot - Follow-up      HPI: Patient is a pleasant 60 year old gentleman who comes in today for follow-up status post right first metatarsal closing osteotomy.  With regards to this he is doing well.  He is concerned because his third toe is curled and quite red.  He thinks this is been going on for couple days.  He is also asking for a callus but he said that his fifth metatarsal head to be debrided  Assessment & Plan: Visit Diagnoses:  1. Chronic toe pain, right foot     Plan: Patient will follow-up for reevaluation in 1 week I have given him a prescription for doxycycline which she should take with a probiotic.  I also gave him a doughnut for the left shoe to relieve pressure under the fifth metatarsal head.  I gave him a hammertoe sleeve and I instructed his wife to put this on very loosely over the second toe.  If she has any concerns for redness or anything she is to stop using this  Follow-Up Instructions: No follow-ups on file.   Ortho Exam  Patient is alert, oriented, no adenopathy, well-dressed, normal affect, normal respiratory effort. Bilaterally he has biphasic pulses.  No ascending cellulitis on either foot.  On the right foot he has erythema over the clogged third toe with associated swelling.  There is an ulcer at the tip of the toe that does not probe deeply.  He has no ascending cellulitis from this.  On the left side he has a thickened callus with a 1 mm ulcer.  Again no surrounding cellulitis or ascending cellulitis this was debrided to a healthy surface  Imaging: No results  found.   Labs: Lab Results  Component Value Date   HGBA1C 7.0 (H) 06/15/2017   HGBA1C 5.2 03/15/2013   HGBA1C 6.5 (H) 03/07/2012   CRP 2.2 06/27/2019   LABURIC 5.5 03/07/2012   REPTSTATUS 02/21/2008 FINAL 02/19/2008   CULT  02/19/2008    Multiple bacterial morphotypes present, none predominant. Suggest appropriate recollection if clinically indicated.     Lab Results  Component Value Date   ALBUMIN 4.6 12/27/2013   ALBUMIN 4.4 05/30/2013   ALBUMIN 4.2 03/16/2013    Lab Results  Component Value Date   MG 1.7 09/05/2012   MG 1.9 06/30/2009   MG 1.9 06/30/2009   Lab Results  Component Value Date   VD25OH 18 (L) 06/15/2017    No results found for: PREALBUMIN CBC EXTENDED Latest Ref Rng & Units 01/09/2021 10/24/2020 06/27/2019  WBC 4.0 - 10.5 K/uL 6.6 6.4 5.5  RBC 4.22 - 5.81 MIL/uL 5.23 5.03 5.64  HGB 13.0 - 17.0 g/dL 15.2 15.3 17.0  HCT 39.0 - 52.0 % 45.2 42.4 49.2  PLT 150 - 400 K/uL 142(L) 147(L) 137(L)  NEUTROABS 1,500 - 7,800 cells/uL - - 3,641  LYMPHSABS 850 - 3,900 cells/uL - - 1,276     There is  no height or weight on file to calculate BMI.  Orders:  Orders Placed This Encounter  Procedures   XR Toe 3rd Right   No orders of the defined types were placed in this encounter.    Procedures: No procedures performed  Clinical Data: No additional findings.  ROS:  All other systems negative, except as noted in the HPI. Review of Systems  Objective: Vital Signs: There were no vitals taken for this visit.  Specialty Comments:  No specialty comments available.  PMFS History: Patient Active Problem List   Diagnosis Date Noted   Non-pressure chronic ulcer of other part of right foot limited to breakdown of skin ([place])    Contracture of right Achilles tendon    Mildly dilatd aortic root (H[facility])    Contracture of left Achilles tendon    Metatarsal deformity, left    CAD (coronary artery disease)    Diarrhea 06/27/2019   Diastasis recti [id number]    Umbilical hernia without obstruction and without gangrene 08/22/2018   Vitamin D deficiency 06/23/2017   Essential hypertension, benign 06/15/2017   Mixed hyperlipidemia 06/15/2017   Class 1 obesity due to excess calories with serious comorbidity and body mass index (BMI) of 31.0 to 31.9 in adult 06/15/2017   Malfunction of implantable cardioverter-defibrillator (ICD) electrode 10/07/2016   Chronic kidney disease (CKD), stage III (moderate) ([place]) 04/04/2015   Bladder neck obstruction 03/21/2015   Difficult or painful urination 03/21/2015   Flank pain 03/21/2015   Delayed onset of urination 03/21/2015   Calculus of kidney 03/21/2015   Erosive esophagitis 12/27/2013   Hypotension due to drugs [id number]   Chronic systolic heart failure ([place]) 04/24/2013   CAP (community acquired pneumonia) 03/16/2013   HTN (hypertension) 03/15/2013   Hydrocele 03/15/2013   Spermatocele 03/15/2013   DOE (dyspnea on exertion) 03/15/2013   OSA (obstructive sleep apnea) 01/09/2013   Chronic kidney disease, stage 3 ([place]) 01/03/2013   Chest pain 12/26/2012   History of diagnostic tests 09/06/2012   TIA (transient ischemic attack)    Cardiomyopathy, nonischemic ([place]) 02/24/2010   AICD (automatic cardioverter/defibrillator) 02/24/2010   DM type 2 causing vascular disease ([place]) 06/27/2009   Gout 06/27/2009   Tobacco abuse, in remission 06/27/2009   COLONIC POLYPS 10/31/2008   GERD (gastroesophageal reflux disease) 10/31/2008   Past Medical History:  Diagnosis Date   AICD (automatic cardioverter/defibrillator) present 11/2007   a. 11/2007 SJM Current VR - single lead ICD  - Removed 2018 - "it was burning me"   Anxiety    CAD (coronary artery disease)    non-obstructive CAD by Cor CT in 2020 // Myoview 3/22: EF 57, small inf-sept defect c/w scar, no ischemia; low risk      Chest pain    a. 10/2007 Cath:  normal Cors.   CKD (chronic kidney disease), stage II    DDD (degenerative disc disease), lumbar     Diabetes mellitus DX: 2010   Erosive esophagitis    a. per EGD (08/2011), Dr. Laural Golden - Erosive reflux esophagitis improved but not completely healed since previous EGD 3 years ago. Bx showing  ulcerated gatroesophageal junction mucosa. negative for H. pylori   GERD (gastroesophageal reflux disease)    Gout    Hearing deficit    a. wear bilateral hearing aides   History of hiatal hernia    Hypertension    Mildly dilatd aortic root ([place])    CMR 4/22: EF 52, no LGE; d/w Dr. Marlowe Kays root 38 mm (mildly dilated)  Myocardial infarction [facility]) 2011   Neuropathy    Feet and legs   Nonischemic dilated cardiomyopathy ([place])    a. H/O EF as low as 35-40% by LV gram 10/2007;  b. Echo 02/2011 EF 50-55%, inf HK, Gr 1 DD // CMR 4/22: EF 52, no LGE; d/w Dr. Marlowe Kays root 38 mm (mildly dilated)    Renal insufficiency    Sleep apnea    pt doesnt use, states"I cant afford one". PCP aware   Stroke ([place])    mini-stroke in 2014   TIA (transient ischemic attack)    July, 2013   Tobacco abuse, in remission 06/27/2009   Discontinued in 2009     Wears dentures    top plate   WPW (Wolff-Parkinson-White syndrome)    a. s/p RFCA @ [place]    Family History  Problem Relation Age of Onset   Heart attack Mother 13   Hypertension Mother    Diabetes Mother    Kidney disease Mother    Breast cancer Mother    Heart attack Father 44   Hypertension Father    Diabetes Father    Stroke Brother    Heart attack Brother 66   Stroke Maternal Grandmother 80   Diabetes Brother    Hypertension Brother     Past Surgical History:  Procedure Laterality Date   [place]     2009   CARDIAC DEFIBRILLATOR [place], LAPAROSCOPIC     11/2007   COLONOSCOPY W/ POLYPECTOMY  2009   ELBOW SURGERY Left 06/2010   ESOPHAGEAL DILATION N/A 11/08/2014   Procedure: ESOPHAGEAL DILATION;  Surgeon: Rogene Houston, MD;   [facility];  Service: Endoscopy;  Laterality: N/A;  #56,    ESOPHAGOGASTRODUODENOSCOPY  03/31/2012   also 08/2011; [name]   ESOPHAGOGASTRODUODENOSCOPY (EGD) WITH PROPOFOL N/A 11/08/2014   Procedure: ESOPHAGOGASTRODUODENOSCOPY (EGD) WITH PROPOFOL;  Surgeon: Rogene Houston, MD;  [facility];  Service: Endoscopy;  Laterality: N/A;  Hi[place] is 53 , GE Junction is 37   FOOT ARTHRODESIS Left 10/24/2020   Procedure: LEFT GASTROCNEMIUS RECESSION, DORSIFLEXION OSTEOTOMY 1ST MT;  Surgeon: [name], MD;  Location: [facility];  Service: Orthopedics;  Laterality: Left;   FOOT ARTHRODESIS Right 01/09/2021   Procedure: CLOSING WEDGE OSTEOTOMY RIGHT 1ST METATARSAL;  Surgeon: [name], MD;  Location: [facility];  Service: Orthopedics;  Laterality: Right;   GASTROCNEMIUS RECESSION Right 01/09/2021   Procedure: RIGHT GASTROCNEMIUS RECESSION;  Surgeon: [name], MD;  Location: [facility];  Service: Orthopedics;  Laterality: Right;   ICD LEAD REMOVAL N/A 10/07/2016   Procedure: ICD LEAD REMOVAL ;  Surgeon: Evans Lance, MD;  Location: [place];  Service: Cardiovascular;  Laterality: N/A;   [place]; performed at [facility]   TEE WITHOUT CARDIOVERSION N/A 10/07/2016   Procedure: TRANSESOPHAGEAL ECHOCARDIOGRAM (TEE);  Surgeon: Evans Lance, MD;  Location: [facility];  Service: Cardiovascular;  Laterality: N/A;   Social History   Occupational History   Occupation: Worked for [employer]: [employer]    Comment: Laid off in 11/11  Tobacco Use   Smoking status: Former    Packs/day: 1.50    Years: 35.00    Pack years: 52.50    Types: Cigarettes    Start  date: 06/30/1971    Quit date: 07/30/2010    Years since quitting: 10.6   Smokeless tobacco: Former    Types: Chew    Quit date: 03/26/1994  Vaping Use   Vaping Use: Former   Start date: 07/30/2010   Quit date: 06/29/2016  Substance and Sexual Activity   Alcohol use: No    Alcohol/week:  1.0 standard drink    Types: 1 Cans of beer per week    Comment: used "years ago"   Drug use: No   Sexual activity: Never    Birth control/protection: None

## 2021-03-30 ENCOUNTER — Other Ambulatory Visit: Payer: Self-pay | Admitting: Student

## 2021-03-31 ENCOUNTER — Ambulatory Visit: Payer: Medicare Other | Admitting: Physician Assistant

## 2021-04-09 DIAGNOSIS — R490 Dysphonia: Secondary | ICD-10-CM | POA: Diagnosis not present

## 2021-04-09 DIAGNOSIS — K219 Gastro-esophageal reflux disease without esophagitis: Secondary | ICD-10-CM | POA: Diagnosis not present

## 2021-04-13 ENCOUNTER — Other Ambulatory Visit (INDEPENDENT_AMBULATORY_CARE_PROVIDER_SITE_OTHER): Payer: Self-pay | Admitting: Internal Medicine

## 2021-04-13 DIAGNOSIS — K219 Gastro-esophageal reflux disease without esophagitis: Secondary | ICD-10-CM

## 2021-04-13 DIAGNOSIS — R1319 Other dysphagia: Secondary | ICD-10-CM

## 2021-05-01 DIAGNOSIS — G4730 Sleep apnea, unspecified: Secondary | ICD-10-CM | POA: Diagnosis not present

## 2021-05-01 DIAGNOSIS — E063 Autoimmune thyroiditis: Secondary | ICD-10-CM | POA: Diagnosis not present

## 2021-05-01 DIAGNOSIS — E1140 Type 2 diabetes mellitus with diabetic neuropathy, unspecified: Secondary | ICD-10-CM | POA: Diagnosis not present

## 2021-05-01 DIAGNOSIS — K2100 Gastro-esophageal reflux disease with esophagitis, without bleeding: Secondary | ICD-10-CM | POA: Diagnosis not present

## 2021-05-01 DIAGNOSIS — E782 Mixed hyperlipidemia: Secondary | ICD-10-CM | POA: Diagnosis not present

## 2021-05-04 ENCOUNTER — Other Ambulatory Visit: Payer: Self-pay | Admitting: Student

## 2021-05-12 ENCOUNTER — Other Ambulatory Visit: Payer: Self-pay | Admitting: Physician Assistant

## 2021-05-29 DIAGNOSIS — K2100 Gastro-esophageal reflux disease with esophagitis, without bleeding: Secondary | ICD-10-CM | POA: Diagnosis not present

## 2021-05-29 DIAGNOSIS — E063 Autoimmune thyroiditis: Secondary | ICD-10-CM | POA: Diagnosis not present

## 2021-05-29 DIAGNOSIS — M1991 Primary osteoarthritis, unspecified site: Secondary | ICD-10-CM | POA: Diagnosis not present

## 2021-05-29 DIAGNOSIS — E1140 Type 2 diabetes mellitus with diabetic neuropathy, unspecified: Secondary | ICD-10-CM | POA: Diagnosis not present

## 2021-05-29 DIAGNOSIS — I10 Essential (primary) hypertension: Secondary | ICD-10-CM | POA: Diagnosis not present

## 2021-05-29 DIAGNOSIS — G894 Chronic pain syndrome: Secondary | ICD-10-CM | POA: Diagnosis not present

## 2021-06-10 ENCOUNTER — Other Ambulatory Visit: Payer: Self-pay | Admitting: Physician Assistant

## 2021-06-29 DIAGNOSIS — G894 Chronic pain syndrome: Secondary | ICD-10-CM | POA: Diagnosis not present

## 2021-06-29 DIAGNOSIS — K219 Gastro-esophageal reflux disease without esophagitis: Secondary | ICD-10-CM | POA: Diagnosis not present

## 2021-06-29 DIAGNOSIS — I209 Angina pectoris, unspecified: Secondary | ICD-10-CM | POA: Diagnosis not present

## 2021-06-29 DIAGNOSIS — E1140 Type 2 diabetes mellitus with diabetic neuropathy, unspecified: Secondary | ICD-10-CM | POA: Diagnosis not present

## 2021-06-29 DIAGNOSIS — I43 Cardiomyopathy in diseases classified elsewhere: Secondary | ICD-10-CM | POA: Diagnosis not present

## 2021-06-29 DIAGNOSIS — I10 Essential (primary) hypertension: Secondary | ICD-10-CM | POA: Diagnosis not present

## 2021-07-06 ENCOUNTER — Other Ambulatory Visit: Payer: Self-pay | Admitting: Student

## 2021-07-29 DIAGNOSIS — I209 Angina pectoris, unspecified: Secondary | ICD-10-CM | POA: Diagnosis not present

## 2021-07-29 DIAGNOSIS — I10 Essential (primary) hypertension: Secondary | ICD-10-CM | POA: Diagnosis not present

## 2021-07-29 DIAGNOSIS — E1140 Type 2 diabetes mellitus with diabetic neuropathy, unspecified: Secondary | ICD-10-CM | POA: Diagnosis not present

## 2021-07-29 DIAGNOSIS — G894 Chronic pain syndrome: Secondary | ICD-10-CM | POA: Diagnosis not present

## 2021-08-31 DIAGNOSIS — E1129 Type 2 diabetes mellitus with other diabetic kidney complication: Secondary | ICD-10-CM | POA: Diagnosis not present

## 2021-08-31 DIAGNOSIS — I10 Essential (primary) hypertension: Secondary | ICD-10-CM | POA: Diagnosis not present

## 2021-08-31 DIAGNOSIS — E11319 Type 2 diabetes mellitus with unspecified diabetic retinopathy without macular edema: Secondary | ICD-10-CM | POA: Diagnosis not present

## 2021-08-31 DIAGNOSIS — I43 Cardiomyopathy in diseases classified elsewhere: Secondary | ICD-10-CM | POA: Diagnosis not present

## 2021-08-31 DIAGNOSIS — G894 Chronic pain syndrome: Secondary | ICD-10-CM | POA: Diagnosis not present

## 2021-08-31 DIAGNOSIS — L739 Follicular disorder, unspecified: Secondary | ICD-10-CM | POA: Diagnosis not present

## 2021-08-31 DIAGNOSIS — E063 Autoimmune thyroiditis: Secondary | ICD-10-CM | POA: Diagnosis not present

## 2021-08-31 DIAGNOSIS — L97509 Non-pressure chronic ulcer of other part of unspecified foot with unspecified severity: Secondary | ICD-10-CM | POA: Diagnosis not present

## 2021-08-31 DIAGNOSIS — E539 Vitamin B deficiency, unspecified: Secondary | ICD-10-CM | POA: Diagnosis not present

## 2021-08-31 DIAGNOSIS — M5136 Other intervertebral disc degeneration, lumbar region: Secondary | ICD-10-CM | POA: Diagnosis not present

## 2021-08-31 DIAGNOSIS — E559 Vitamin D deficiency, unspecified: Secondary | ICD-10-CM | POA: Diagnosis not present

## 2021-08-31 DIAGNOSIS — M1991 Primary osteoarthritis, unspecified site: Secondary | ICD-10-CM | POA: Diagnosis not present

## 2021-09-01 ENCOUNTER — Other Ambulatory Visit: Payer: Self-pay | Admitting: Internal Medicine

## 2021-09-01 DIAGNOSIS — M79605 Pain in left leg: Secondary | ICD-10-CM

## 2021-09-04 ENCOUNTER — Ambulatory Visit (HOSPITAL_COMMUNITY)
Admission: RE | Admit: 2021-09-04 | Discharge: 2021-09-04 | Disposition: A | Discharge status: Home / Self Care | Payer: Medicare Other | Source: Ambulatory Visit | Attending: Internal Medicine | Admitting: Internal Medicine

## 2021-09-04 ENCOUNTER — Other Ambulatory Visit: Payer: Self-pay

## 2021-09-04 DIAGNOSIS — M79605 Pain in left leg: Secondary | ICD-10-CM | POA: Insufficient documentation

## 2021-09-04 DIAGNOSIS — M79604 Pain in right leg: Secondary | ICD-10-CM | POA: Insufficient documentation

## 2021-09-04 DIAGNOSIS — M79661 Pain in right lower leg: Secondary | ICD-10-CM | POA: Diagnosis not present

## 2021-09-04 DIAGNOSIS — M79662 Pain in left lower leg: Secondary | ICD-10-CM | POA: Diagnosis not present

## 2021-10-06 ENCOUNTER — Ambulatory Visit: Payer: Medicare Other | Admitting: Podiatry

## 2021-10-06 ENCOUNTER — Encounter: Payer: Self-pay | Admitting: Podiatry

## 2021-10-06 ENCOUNTER — Ambulatory Visit (INDEPENDENT_AMBULATORY_CARE_PROVIDER_SITE_OTHER): Payer: Medicare Other

## 2021-10-06 ENCOUNTER — Other Ambulatory Visit: Payer: Self-pay

## 2021-10-06 DIAGNOSIS — I10 Essential (primary) hypertension: Secondary | ICD-10-CM | POA: Diagnosis not present

## 2021-10-06 DIAGNOSIS — L97521 Non-pressure chronic ulcer of other part of left foot limited to breakdown of skin: Secondary | ICD-10-CM | POA: Diagnosis not present

## 2021-10-06 DIAGNOSIS — E119 Type 2 diabetes mellitus without complications: Secondary | ICD-10-CM | POA: Diagnosis not present

## 2021-10-06 MED ORDER — MUPIROCIN 2 % EX OINT: 1.0000 "application " | TOPICAL_OINTMENT | Freq: Two times a day (BID) | CUTANEOUS | 0 refills | Status: DC

## 2021-10-06 MED ORDER — AMOXICILLIN-POT CLAVULANATE 875-125 MG PO TABS: 1.0000 | ORAL_TABLET | Freq: Two times a day (BID) | ORAL | 0 refills | Status: DC

## 2021-10-07 NOTE — Progress Notes (Signed)
Subjective:  Patient ID: Mike Floyd, male    DOB: August 27, 1960,  MRN: [id number] HPI Chief Complaint  Patient presents with   Foot Ulcer    Plantar forefoot left - ulcer x 1 year+, been seeing Dr. Sharol Given, not healing, "they keep saying it looks fine", painful to walk, gets sharp, sudden pains intermittent, especially in cold weather, very unsteady gait-uses cane, diabetic - last a1c was 8.5, glucose this morning was 394.   New Patient (Initial Visit)    61 y.o. male presents with the above complaint.   ROS: Denies fever chills nausea vomit muscle aches pains calf pain back pain chest pain shortness of breath.    Past Medical History:  Diagnosis Date   AICD (automatic cardioverter/defibrillator) present 11/2007   a. 11/2007 SJM Current VR - single lead ICD  - Removed 2018 - "it was burning me"   Anxiety    CAD (coronary artery disease)    non-obstructive CAD by Cor CT in 2020 // Myoview 3/22: EF 57, small inf-sept defect c/w scar, no ischemia; low risk      Chest pain    a. 10/2007 Cath:  normal Cors.   CKD (chronic kidney disease), stage II    DDD (degenerative disc disease), lumbar    Diabetes mellitus DX: 2010   Erosive esophagitis    a. per EGD (08/2011), Dr. Laural Golden - Erosive reflux esophagitis improved but not completely healed since previous EGD 3 years ago. Bx showing  ulcerated gatroesophageal junction mucosa. negative for H. pylori   GERD (gastroesophageal reflux disease)    Gout    Hearing deficit    a. wear bilateral hearing aides   History of hiatal hernia    Hypertension    Mildly dilatd aortic root ([place])    CMR 4/22: EF 52, no LGE; d/w Dr. Marlowe Kays root 38 mm (mildly dilated)   Myocardial infarction [facility]) 2011   Neuropathy    Feet and legs   Nonischemic dilated cardiomyopathy ([place])    a. H/O EF as low as 35-40% by LV gram 10/2007;  b. Echo 02/2011 EF 50-55%, inf HK, Gr 1 DD // CMR 4/22: EF 52, no LGE; d/w Dr. Marlowe Kays root 38 mm (mildly dilated)    Renal  insufficiency    Sleep apnea    pt doesnt use, states"I cant afford one". PCP aware   Stroke ([place])    mini-stroke in 2014   TIA (transient ischemic attack)    July, 2013   Tobacco abuse, in remission 06/27/2009   Discontinued in 2009     Wears dentures    top plate   WPW (Wolff-Parkinson-White syndrome)    a. s/p RFCA @ [place]   Past Surgical History:  Procedure Laterality Date   [place]     2009   [place], LAPAROSCOPIC     11/2007   COLONOSCOPY W/ POLYPECTOMY  2009   ELBOW SURGERY Left 06/2010   ESOPHAGEAL DILATION N/A 11/08/2014   Procedure: ESOPHAGEAL DILATION;  Surgeon: Rogene Houston, MD;  Location: [facility];  Service: Endoscopy;  Laterality: N/A;  #56,    ESOPHAGOGASTRODUODENOSCOPY  03/31/2012   also 08/2011; [name]   ESOPHAGOGASTRODUODENOSCOPY (EGD) WITH PROPOFOL N/A 11/08/2014   Procedure: ESOPHAGOGASTRODUODENOSCOPY (EGD) WITH PROPOFOL;  Surgeon: Rogene Houston, MD;  Location: [facility];  Service: Endoscopy;  Laterality: N/A;  [place] is 39 , [place] is 37   FOOT ARTHRODESIS Left 10/24/2020   Procedure: LEFT GASTROCNEMIUS RECESSION, DORSIFLEXION OSTEOTOMY 1ST MT;  Surgeon: [name], MD;  Location: [place];  Service: Orthopedics;  Laterality: Left;   FOOT ARTHRODESIS Right 01/09/2021   Procedure: CLOSING WEDGE OSTEOTOMY RIGHT 1ST METATARSAL;  Surgeon: [name], MD;  Location: [place];  Service: Orthopedics;  Laterality: Right;   GASTROCNEMIUS RECESSION Right 01/09/2021   Procedure: RIGHT GASTROCNEMIUS RECESSION;  Surgeon: [name], MD;  Location: [place];  Service: Orthopedics;  Laterality: Right;   ICD LEAD REMOVAL N/A 10/07/2016   Procedure: ICD LEAD REMOVAL ;  Surgeon: Evans Lance, MD;  Location: [place];  Service: Cardiovascular;  Laterality: N/A;   [place]; performed at [facility]   TEE WITHOUT CARDIOVERSION N/A 10/07/2016   Procedure: TRANSESOPHAGEAL ECHOCARDIOGRAM (TEE);  Surgeon: Evans Lance, MD;  Location: [facility] OR;  Service: Cardiovascular;  Laterality: N/A;    Current Outpatient Medications:    amoxicillin-clavulanate (AUGMENTIN) 875-125 MG tablet, Take 1 tablet by mouth 2 (two) times daily., Disp: 20 tablet, Rfl: 0   mupirocin ointment (BACTROBAN) 2 %, Apply 1 application topically 2 (two) times daily., Disp: 22 g, Rfl: 0   acarbose (PRECOSE) 100 MG tablet, Take 100 mg by mouth 3 (three) times daily., Disp: , Rfl:    amitriptyline (ELAVIL) 50 MG tablet, Take 50 mg by mouth at bedtime., Disp: , Rfl:    aspirin 81 MG tablet, Take 81 mg by mouth daily., Disp: , Rfl:    atorvastatin (LIPITOR) 20 MG tablet, Take 20 mg by mouth daily., Disp: , Rfl:    B-D ULTRAFINE III SHORT PEN 31G X 8 MM MISC, USE WITH TRESBIA., Disp: 100 each, Rfl: 0   calcium carbonate (OSCAL) 1500 (600 Ca) MG TABS tablet, Take 600 mg of elemental calcium by mouth daily with breakfast., Disp: , Rfl:    carvedilol (COREG) 12.5 MG tablet, TAKE 1 TABLET BY MOUTH TWICE DAILY., Disp: 60 tablet, Rfl: 6   clotrimazole-betamethasone (LOTRISONE) cream, Apply topically 2 (two) times daily., Disp: , Rfl:    famotidine (PEPCID) 20 MG tablet, Take 20 mg by mouth daily., Disp: , Rfl:    FARXIGA 10 MG TABS tablet, Take 10 mg by mouth daily., Disp: , Rfl:    furosemide (LASIX) 40 MG tablet, TAKE (1) TABLET BY MOUTH ONCE DAILY. (Patient taking differently: Take 40 mg by mouth daily.), Disp: 30 tablet, Rfl: 6   gabapentin (NEURONTIN) 600 MG tablet, Take 300 mg by mouth 3 (three) times daily., Disp: , Rfl:    glimepiride (AMARYL) 4 MG tablet, Take 4 mg by mouth 2 (two) times daily. , Disp: , Rfl:    insulin degludec (TRESIBA FLEXTOUCH) 100 UNIT/ML SOPN FlexTouch Pen, Inject 0.3 mLs (30 Units total) into the skin at bedtime. (Patient taking differently: Inject 52 Units into the skin daily. 50 - 80  based on CBG    AM), Disp: 15 mL, Rfl: 2   isosorbide mononitrate (IMDUR) 30 MG 24 hr tablet, TAKE (1) TABLET BY MOUTH ONCE DAILY. (Patient taking differently: Take 30 mg by mouth daily.), Disp: 60 tablet, Rfl: 0   LORazepam (ATIVAN) 1 MG tablet, Take 1 mg by mouth at bedtime., Disp: , Rfl:    nitroGLYCERIN (NITROSTAT) 0.4 MG SL tablet, Place 0.4 mg under the tongue every 5 (five) minutes as needed for chest  pain., Disp: , Rfl:    omeprazole (PRILOSEC) 40 MG capsule, TAKE (1) CAPSULE BY MOUTH ONCE DAILY., Disp: 90 capsule, Rfl: 0   ONETOUCH VERIO test strip, TESTING 4 TIMES DAILY., Disp: 150 each, Rfl: 5   oxycodone (ROXICODONE) 30 MG immediate release tablet, Take 30-60 mg by mouth 4 (four) times daily as needed for pain., Disp: , Rfl:    oxyCODONE-acetaminophen (PERCOCET) 5-325 MG tablet, Take 1 tablet by mouth every 4 (four) hours as needed., Disp: 30 tablet, Rfl: 0   pantoprazole (PROTONIX) 40 MG tablet, Take 40 mg by mouth 2 (two) times daily., Disp: , Rfl:    potassium chloride SA (KLOR-CON) 20 MEQ tablet, TAKE (1) TABLET BY MOUTH ONCE DAILY. (Patient taking differently: Take 20 mEq by mouth daily.), Disp: 90 tablet, Rfl: 3   SSD 1 % cream, APPLY TO AFFECTED AREA DAILY AS DIRECTED., Disp: 50 g, Rfl: 0   sucralfate (CARAFATE) 1 g tablet, TAKE 1 TABLET BY MOUTH 30 MINTUES BEFORE MEALS AND AT BEDTIME. (Patient taking differently: Take by mouth 4 (four) times daily -  with meals and at bedtime. TAKE 1 TABLET BY MOUTH 30 MINTUES BEFORE MEALS AND AT BEDTIME.), Disp: 120 tablet, Rfl: 0   tamsulosin (FLOMAX) 0.4 MG CAPS capsule, Take 0.4 mg by mouth daily. am, Disp: , Rfl:    vitamin B-12 (CYANOCOBALAMIN) 1000 MCG tablet, Take 1,000 mcg by mouth daily., Disp: , Rfl:   No Known Allergies Review of Systems Objective:  There were no vitals filed for this visit.  General: Well developed, nourished, in no acute distress, alert and oriented x3   Dermatological: Skin is warm, dry and supple bilateral. Nails x 10 are  well maintained; remaining integument appears unremarkable at this time. There are no open sores, no preulcerative lesions, no rash or signs of infection present.  Ulceration plantar aspect of his first fourth metatarsal head of his left foot surrounding rim of hyperkeratosis with a granular central base no purulence no malodor measures approximately 1.0 cm in diameter prior to debridement  Vascular: Dorsalis Pedis artery and Posterior Tibial artery pedal pulses are 1/4 bilateral with immedate capillary fill time. Pedal hair growth absent. No varicosities and  lower extremity edema present bilateral.   Neruologic: Grossly intact via light touch bilateral. Vibratory intact via tuning fork bilateral. Protective threshold with Semmes Wienstein monofilament intact to all pedal sites bilateral. Patellar and Achilles deep tendon reflexes 2+ bilateral. No Babinski or clonus noted bilateral.   Musculoskeletal: No gross boney pedal deformities bilateral. No pain, crepitus, or limitation noted with foot and ankle range of motion bilateral. Muscular strength 5/5 in all groups tested bilateral.  Hammertoes bilateral rigid in nature  Gait: Unassisted, Nonantalgic.    Radiographs:  Radiographs taken today demonstrate an osseously mature individual overlying this area does not demonstrate any acute findings associated with osteomyelitis however he does have severe hammertoe deformities and there appears to be some erosion in the distal plantar aspect of the proximal phalanx of the hallux.  Assessment & Plan:   Assessment: Chronic ulceration plantar aspect forefoot left  Plan: Discussed etiology pathology conservative versus surgical therapies at this point I debrided the wound today in a sterile fashion with a chisel blade measures just over 1.2 cm in diameter after debridement depth is minimal no purulence.  Redressed today Silvadene cream dressed a compressive dressing and he will continue to do so with  Bactroban ointment on a daily basis  I did discuss with him in great  detail today that his foot would not heal if he continues to apply pressure and stay on the foot all the time.  I also stated that the wound would not heal with circulating blood sugars of 394 mg/dL  I want him to follow-up with Dr. Posey Pronto for treatment of this ulceration.  Patient does state that he is seeing a endocrinologist in the near future to hopefully get his blood sugars back down in the 100 range.  I think Dr. Posey Pronto can possibly graft this and probably offload this properly offload this.  Once healed he will need to continue with Dr. Adah Perl or Dr. Sharyon Cable for routine debridement of his nails and calluses.     Joury Allcorn T. [place], [place]

## 2021-10-14 ENCOUNTER — Other Ambulatory Visit: Payer: Self-pay | Admitting: Student

## 2021-10-20 DIAGNOSIS — M1991 Primary osteoarthritis, unspecified site: Secondary | ICD-10-CM | POA: Diagnosis not present

## 2021-10-20 DIAGNOSIS — E1140 Type 2 diabetes mellitus with diabetic neuropathy, unspecified: Secondary | ICD-10-CM | POA: Diagnosis not present

## 2021-10-20 DIAGNOSIS — M5136 Other intervertebral disc degeneration, lumbar region: Secondary | ICD-10-CM | POA: Diagnosis not present

## 2021-10-20 DIAGNOSIS — I10 Essential (primary) hypertension: Secondary | ICD-10-CM | POA: Diagnosis not present

## 2021-10-20 DIAGNOSIS — I43 Cardiomyopathy in diseases classified elsewhere: Secondary | ICD-10-CM | POA: Diagnosis not present

## 2021-10-20 DIAGNOSIS — G894 Chronic pain syndrome: Secondary | ICD-10-CM | POA: Diagnosis not present

## 2021-10-20 DIAGNOSIS — N183 Chronic kidney disease, stage 3 unspecified: Secondary | ICD-10-CM | POA: Diagnosis not present

## 2021-10-21 ENCOUNTER — Ambulatory Visit: Payer: Medicare Other | Admitting: Podiatry

## 2021-10-21 ENCOUNTER — Other Ambulatory Visit: Payer: Self-pay

## 2021-10-21 DIAGNOSIS — L97521 Non-pressure chronic ulcer of other part of left foot limited to breakdown of skin: Secondary | ICD-10-CM | POA: Diagnosis not present

## 2021-10-21 DIAGNOSIS — M216X2 Other acquired deformities of left foot: Secondary | ICD-10-CM

## 2021-10-21 MED ORDER — TRAMADOL HCL 50 MG PO TABS: 50.0000 mg | ORAL_TABLET | Freq: Three times a day (TID) | ORAL | 0 refills | Status: AC | PRN

## 2021-10-21 NOTE — Progress Notes (Signed)
?Subjective:  ?Patient ID: Mike Floyd, male    DOB: 04-May-1961,  MRN: [id number] ? ?Chief Complaint  ?Patient presents with  ? Foot Ulcer  ? ? ?61 y.o. male presents for wound care.  Patient presents with complaint left submetatarsal fourth ulceration.  Patient states been present for quite some time he just saw Dr. Milinda Pointer as well.  He was referred to me for wound care management.  He states that it has been present for quite some time he has had arterial ultrasound done bilaterally as well.  He was known to Dr. Wardell Heath due to.  The ulcer has been present for quite some time.  He denies any other acute complaints ? ? ?Review of Systems: Negative except as noted in the HPI. Denies N/V/F/Ch. ? ?Past Medical History:  ?Diagnosis Date  ? AICD (automatic cardioverter/defibrillator) present 11/2007  ? a. 11/2007 SJM Current VR - single lead ICD  - Removed 2018 - "it was burning me"  ? Anxiety   ? CAD (coronary artery disease)   ? non-obstructive CAD by Cor CT in 2020 // Myoview 3/22: EF 57, small inf-sept defect c/w scar, no ischemia; low risk     ? Chest pain   ? a. 10/2007 Cath:  normal Cors.  ? CKD (chronic kidney disease), stage II   ? DDD (degenerative disc disease), lumbar   ? Diabetes mellitus DX: 2010  ? Erosive esophagitis   ? a. per EGD (08/2011), Dr. Laural Golden - Erosive reflux esophagitis improved but not completely healed since previous EGD 3 years ago. Bx showing  ulcerated gatroesophageal junction mucosa. negative for H. pylori  ? GERD (gastroesophageal reflux disease)   ? Gout   ? Hearing deficit   ? a. wear bilateral hearing aides  ? History of hiatal hernia   ? Hypertension   ? Mildly dilatd aortic root ([place])   ? CMR 4/22: EF 52, no LGE; d/w Dr. Marlowe Kays root 38 mm (mildly dilated)  ? Myocardial infarction [facility]) 2011  ? Neuropathy   ? Feet and legs  ? Nonischemic dilated cardiomyopathy ([place])   ? a. H/O EF as low as 35-40% by LV gram 10/2007;  b. Echo 02/2011 EF 50-55%, inf HK, Gr 1 DD // CMR 4/22: EF 52,  no LGE; d/w Dr. Marlowe Kays root 38 mm (mildly dilated)   ? Renal insufficiency   ? Sleep apnea   ? pt doesnt use, states"I cant afford one". PCP aware  ? Stroke [facility])   ? mini-stroke in 2014  ? TIA (transient ischemic attack)   ? July, 2013  ? Tobacco abuse, in remission 06/27/2009  ? Discontinued in 2009    ? Wears dentures   ? top plate  ? WPW (Wolff-Parkinson-White syndrome)   ? a. s/p RFCA @ [place]  ? ? ?Current Outpatient Medications:  ?  potassium chloride SA (KLOR-CON M) 20 MEQ tablet, TAKE (1) TABLET BY MOUTH ONCE DAILY., Disp: 90 tablet, Rfl: 3 ?  acarbose (PRECOSE) 100 MG tablet, Take 100 mg by mouth 3 (three) times daily., Disp: , Rfl:  ?  amitriptyline (ELAVIL) 50 MG tablet, Take 50 mg by mouth at bedtime., Disp: , Rfl:  ?  amoxicillin-clavulanate (AUGMENTIN) 875-125 MG tablet, Take 1 tablet by mouth 2 (two) times daily., Disp: 20 tablet, Rfl: 0 ?  aspirin 81 MG tablet, Take 81 mg by mouth daily., Disp: , Rfl:  ?  atorvastatin (LIPITOR) 20 MG tablet, Take 20 mg by mouth daily., Disp: , Rfl:  ?  B-D ULTRAFINE III SHORT PEN 31G X 8 MM MISC, USE WITH TRESBIA., Disp: 100 each, Rfl: 0 ?  calcium carbonate (OSCAL) 1500 (600 Ca) MG TABS tablet, Take 600 mg of elemental calcium by mouth daily with breakfast., Disp: , Rfl:  ?  carvedilol (COREG) 12.5 MG tablet, TAKE 1 TABLET BY MOUTH TWICE DAILY., Disp: 60 tablet, Rfl: 6 ?  clotrimazole-betamethasone (LOTRISONE) cream, Apply topically 2 (two) times daily., Disp: , Rfl:  ?  famotidine (PEPCID) 20 MG tablet, Take 20 mg by mouth daily., Disp: , Rfl:  ?  FARXIGA 10 MG TABS tablet, Take 10 mg by mouth daily., Disp: , Rfl:  ?  furosemide (LASIX) 40 MG tablet, TAKE (1) TABLET BY MOUTH ONCE DAILY. (Patient taking differently: Take 40 mg by mouth daily.), Disp: 30 tablet, Rfl: 6 ?  gabapentin (NEURONTIN) 600 MG tablet, Take 300 mg by mouth 3 (three) times daily., Disp: , Rfl:  ?  glimepiride (AMARYL) 4 MG tablet, Take 4 mg by mouth 2 (two) times daily. , Disp: ,  Rfl:  ?  insulin degludec (TRESIBA FLEXTOUCH) 100 UNIT/ML SOPN FlexTouch Pen, Inject 0.3 mLs (30 Units total) into the skin at bedtime. (Patient taking differently: Inject 52 Units into the skin daily. 50 - 80  based on CBG   AM), Disp: 15 mL, Rfl: 2 ?  isosorbide mononitrate (IMDUR) 30 MG 24 hr tablet, TAKE (1) TABLET BY MOUTH ONCE DAILY. (Patient taking differently: Take 30 mg by mouth daily.), Disp: 60 tablet, Rfl: 0 ?  LORazepam (ATIVAN) 1 MG tablet, Take 1 mg by mouth at bedtime., Disp: , Rfl:  ?  mupirocin ointment (BACTROBAN) 2 %, Apply 1 application topically 2 (two) times daily., Disp: 22 g, Rfl: 0 ?  nitroGLYCERIN (NITROSTAT) 0.4 MG SL tablet, Place 0.4 mg under the tongue every 5 (five) minutes as needed for chest pain., Disp: , Rfl:  ?  omeprazole (PRILOSEC) 40 MG capsule, TAKE (1) CAPSULE BY MOUTH ONCE DAILY., Disp: 90 capsule, Rfl: 0 ?  ONETOUCH VERIO test strip, TESTING 4 TIMES DAILY., Disp: 150 each, Rfl: 5 ?  oxycodone (ROXICODONE) 30 MG immediate release tablet, Take 30-60 mg by mouth 4 (four) times daily as needed for pain., Disp: , Rfl:  ?  oxyCODONE-acetaminophen (PERCOCET) 5-325 MG tablet, Take 1 tablet by mouth every 4 (four) hours as needed., Disp: 30 tablet, Rfl: 0 ?  pantoprazole (PROTONIX) 40 MG tablet, Take 40 mg by mouth 2 (two) times daily., Disp: , Rfl:  ?  SSD 1 % cream, APPLY TO AFFECTED AREA DAILY AS DIRECTED., Disp: 50 g, Rfl: 0 ?  sucralfate (CARAFATE) 1 g tablet, TAKE 1 TABLET BY MOUTH 30 MINTUES BEFORE MEALS AND AT BEDTIME. (Patient taking differently: Take by mouth 4 (four) times daily -  with meals and at bedtime. TAKE 1 TABLET BY MOUTH 30 MINTUES BEFORE MEALS AND AT BEDTIME.), Disp: 120 tablet, Rfl: 0 ?  tamsulosin (FLOMAX) 0.4 MG CAPS capsule, Take 0.4 mg by mouth daily. am, Disp: , Rfl:  ?  vitamin B-12 (CYANOCOBALAMIN) 1000 MCG tablet, Take 1,000 mcg by mouth daily., Disp: , Rfl:  ? ?Social History  ? ?Tobacco Use  ?Smoking Status Former  ? Packs/day: 1.50  ? Years: 35.00   ? Pack years: 52.50  ? Types: Cigarettes  ? Start date: 06/30/1971  ? Quit date: 07/30/2010  ? Years since quitting: 11.2  ?Smokeless Tobacco Former  ? Types: Chew  ? Quit date: 03/26/1994  ? ? ?No Known Allergies ?  Objective:  ?There were no vitals filed for this visit. ?There is no height or weight on file to calculate BMI. ?Constitutional Well developed. ?Well nourished.  ?Vascular Dorsalis pedis pulses palpable bilaterally. ?Posterior tibial pulses palpable bilaterally. ?Capillary refill normal to all digits.  ?No cyanosis or clubbing noted. ?Pedal hair growth normal.  ?Neurologic Normal speech. ?Oriented to person, place, and time. ?Protective sensation absent  ?Dermatologic Wound Location: Left submetatarsal 4 ulceration with fat layer exposed.  No malodor present no probing down to bone noted.  No redness noted.  Plantarflexed metatarsal noted. ?Wound Base: Mixed Granular/Fibrotic ?Peri-wound: Calloused ?Exudate: Scant/small amount Serosanguinous exudate ?Wound Measurements: ?-See below  ?Orthopedic: No pain to palpation either foot.  ? ?Radiographs: 3 views of skeletally mature adult prior x-rays were reviewed.  No signs of osteomyelitis noted.  No bony abnormalities noted.  Previous hardware noted. ?Assessment:  ?No diagnosis found. ?Plan:  ?Patient was evaluated and treated and all questions answered. ? ?Ulcer left submetatarsal 4 ulceration with fat layer exposed with underlying plantarflexed fourth metatarsal  ?-Debridement as below. ?-Dressed with Betadine wet-to-dry, DSD. ?-Continue off-loading with surgical shoe. ?-Recent arterial duplex studies show Directed duplex of the bilateral lower extremity demonstrates ?waveforms maintained throughout. ? ?Procedure: Excisional Debridement of Wound ?Tool: Sharp chisel blade/tissue nipper ?Rationale: Removal of non-viable soft tissue from the wound to promote healing.  ?Anesthesia: none ?Pre-Debridement Wound Measurements: 1 cm x 0.7 cm x 0.3 cm   ?Post-Debridement Wound Measurements: 1.2 cm x 0.8 cm x 0.3 cm  ?Type of Debridement: Sharp Excisional ?Tissue Removed: Non-viable soft tissue ?Blood loss: Minimal (<50cc) ?Depth of Debridement: subcutaneous tissue. ?

## 2021-10-27 ENCOUNTER — Encounter: Payer: Self-pay | Admitting: Podiatry

## 2021-11-04 ENCOUNTER — Encounter: Payer: Self-pay | Admitting: Podiatry

## 2021-11-04 ENCOUNTER — Ambulatory Visit: Payer: Medicare Other | Admitting: Podiatry

## 2021-11-04 ENCOUNTER — Telehealth: Payer: Self-pay | Admitting: *Deleted

## 2021-11-04 DIAGNOSIS — L97521 Non-pressure chronic ulcer of other part of left foot limited to breakdown of skin: Secondary | ICD-10-CM

## 2021-11-04 DIAGNOSIS — E1165 Type 2 diabetes mellitus with hyperglycemia: Secondary | ICD-10-CM

## 2021-11-04 DIAGNOSIS — R739 Hyperglycemia, unspecified: Secondary | ICD-10-CM | POA: Diagnosis not present

## 2021-11-04 DIAGNOSIS — Z794 Long term (current) use of insulin: Secondary | ICD-10-CM

## 2021-11-04 NOTE — Progress Notes (Signed)
?Subjective:  ?Patient ID: Mike Floyd, male    DOB: 1960/12/11,  MRN: [id number] ? ?Chief Complaint  ?Patient presents with  ? Foot Ulcer  ? ? ?61 y.o. male presents for wound care.  Patient presents with complaint left submetatarsal fourth ulceration.  Patient states that the wound is about the same.  He states that it hurts with ambulation is very sore.  States glucose are between 3-4 100s.  He has not been able to get in to be seen by endocrinologist. ? ? ?Review of Systems: Negative except as noted in the HPI. Denies N/V/F/Ch. ? ?Past Medical History:  ?Diagnosis Date  ? AICD (automatic cardioverter/defibrillator) present 11/2007  ? a. 11/2007 SJM Current VR - single lead ICD  - Removed 2018 - "it was burning me"  ? Anxiety   ? CAD (coronary artery disease)   ? non-obstructive CAD by Cor CT in 2020 // Myoview 3/22: EF 57, small inf-sept defect c/w scar, no ischemia; low risk     ? Chest pain   ? a. 10/2007 Cath:  normal Cors.  ? CKD (chronic kidney disease), stage II   ? DDD (degenerative disc disease), lumbar   ? Diabetes mellitus DX: 2010  ? Erosive esophagitis   ? a. per EGD (08/2011), Dr. Laural Golden - Erosive reflux esophagitis improved but not completely healed since previous EGD 3 years ago. Bx showing  ulcerated gatroesophageal junction mucosa. negative for H. pylori  ? GERD (gastroesophageal reflux disease)   ? Gout   ? Hearing deficit   ? a. wear bilateral hearing aides  ? History of hiatal hernia   ? Hypertension   ? Mildly dilatd aortic root ([place])   ? CMR 4/22: EF 52, no LGE; d/w Dr. Marlowe Kays root 38 mm (mildly dilated)  ? Myocardial infarction [facility] 2011  ? Neuropathy   ? Feet and legs  ? Nonischemic dilated cardiomyopathy ([place])   ? a. H/O EF as low as 35-40% by LV gram 10/2007;  b. Echo 02/2011 EF 50-55%, inf HK, Gr 1 DD // CMR 4/22: EF 52, no LGE; d/w Dr. Marlowe Kays root 38 mm (mildly dilated)   ? Renal insufficiency   ? Sleep apnea   ? pt doesnt use, states"I cant afford one". PCP aware  ? Stroke  [facility]   ? mini-stroke in 2014  ? TIA (transient ischemic attack)   ? July, 2013  ? Tobacco abuse, in remission 06/27/2009  ? Discontinued in 2009    ? Wears dentures   ? top plate  ? WPW (Wolff-Parkinson-White syndrome)   ? a. s/p RFCA @ [place]  ? ? ?Current Outpatient Medications:  ?  potassium chloride SA (KLOR-CON M) 20 MEQ tablet, TAKE (1) TABLET BY MOUTH ONCE DAILY., Disp: 90 tablet, Rfl: 3 ?  acarbose (PRECOSE) 100 MG tablet, Take 100 mg by mouth 3 (three) times daily., Disp: , Rfl:  ?  amitriptyline (ELAVIL) 50 MG tablet, Take 50 mg by mouth at bedtime., Disp: , Rfl:  ?  amoxicillin-clavulanate (AUGMENTIN) 875-125 MG tablet, Take 1 tablet by mouth 2 (two) times daily., Disp: 20 tablet, Rfl: 0 ?  aspirin 81 MG tablet, Take 81 mg by mouth daily., Disp: , Rfl:  ?  atorvastatin (LIPITOR) 20 MG tablet, Take 20 mg by mouth daily., Disp: , Rfl:  ?  B-D ULTRAFINE III SHORT PEN 31G X 8 MM MISC, USE WITH TRESBIA., Disp: 100 each, Rfl: 0 ?  calcium carbonate (OSCAL) 1500 (600 Ca) MG TABS  tablet, Take 600 mg of elemental calcium by mouth daily with breakfast., Disp: , Rfl:  ?  carvedilol (COREG) 12.5 MG tablet, TAKE 1 TABLET BY MOUTH TWICE DAILY., Disp: 60 tablet, Rfl: 6 ?  clotrimazole-betamethasone (LOTRISONE) cream, Apply topically 2 (two) times daily., Disp: , Rfl:  ?  famotidine (PEPCID) 20 MG tablet, Take 20 mg by mouth daily., Disp: , Rfl:  ?  FARXIGA 10 MG TABS tablet, Take 10 mg by mouth daily., Disp: , Rfl:  ?  furosemide (LASIX) 40 MG tablet, TAKE (1) TABLET BY MOUTH ONCE DAILY. (Patient taking differently: Take 40 mg by mouth daily.), Disp: 30 tablet, Rfl: 6 ?  gabapentin (NEURONTIN) 600 MG tablet, Take 300 mg by mouth 3 (three) times daily., Disp: , Rfl:  ?  glimepiride (AMARYL) 4 MG tablet, Take 4 mg by mouth 2 (two) times daily. , Disp: , Rfl:  ?  insulin degludec (TRESIBA FLEXTOUCH) 100 UNIT/ML SOPN FlexTouch Pen, Inject 0.3 mLs (30 Units total) into the skin at bedtime. (Patient taking  differently: Inject 52 Units into the skin daily. 50 - 80  based on CBG   AM), Disp: 15 mL, Rfl: 2 ?  isosorbide mononitrate (IMDUR) 30 MG 24 hr tablet, TAKE (1) TABLET BY MOUTH ONCE DAILY. (Patient taking differently: Take 30 mg by mouth daily.), Disp: 60 tablet, Rfl: 0 ?  LORazepam (ATIVAN) 1 MG tablet, Take 1 mg by mouth at bedtime., Disp: , Rfl:  ?  mupirocin ointment (BACTROBAN) 2 %, Apply 1 application topically 2 (two) times daily., Disp: 22 g, Rfl: 0 ?  nitroGLYCERIN (NITROSTAT) 0.4 MG SL tablet, Place 0.4 mg under the tongue every 5 (five) minutes as needed for chest pain., Disp: , Rfl:  ?  omeprazole (PRILOSEC) 40 MG capsule, TAKE (1) CAPSULE BY MOUTH ONCE DAILY., Disp: 90 capsule, Rfl: 0 ?  ONETOUCH VERIO test strip, TESTING 4 TIMES DAILY., Disp: 150 each, Rfl: 5 ?  oxycodone (ROXICODONE) 30 MG immediate release tablet, Take 30-60 mg by mouth 4 (four) times daily as needed for pain., Disp: , Rfl:  ?  oxyCODONE-acetaminophen (PERCOCET) 5-325 MG tablet, Take 1 tablet by mouth every 4 (four) hours as needed., Disp: 30 tablet, Rfl: 0 ?  pantoprazole (PROTONIX) 40 MG tablet, Take 40 mg by mouth 2 (two) times daily., Disp: , Rfl:  ?  SSD 1 % cream, APPLY TO AFFECTED AREA DAILY AS DIRECTED., Disp: 50 g, Rfl: 0 ?  sucralfate (CARAFATE) 1 g tablet, TAKE 1 TABLET BY MOUTH 30 MINTUES BEFORE MEALS AND AT BEDTIME. (Patient taking differently: Take by mouth 4 (four) times daily -  with meals and at bedtime. TAKE 1 TABLET BY MOUTH 30 MINTUES BEFORE MEALS AND AT BEDTIME.), Disp: 120 tablet, Rfl: 0 ?  tamsulosin (FLOMAX) 0.4 MG CAPS capsule, Take 0.4 mg by mouth daily. am, Disp: , Rfl:  ?  vitamin B-12 (CYANOCOBALAMIN) 1000 MCG tablet, Take 1,000 mcg by mouth daily., Disp: , Rfl:  ? ?Social History  ? ?Tobacco Use  ?Smoking Status Former  ? Packs/day: 1.50  ? Years: 35.00  ? Pack years: 52.50  ? Types: Cigarettes  ? Start date: 06/30/1971  ? Quit date: 07/30/2010  ? Years since quitting: 11.2  ?Smokeless Tobacco Former  ?  Types: Chew  ? Quit date: 03/26/1994  ? ? ?No Known Allergies ?Objective:  ?There were no vitals filed for this visit. ?There is no height or weight on file to calculate BMI. ?Constitutional Well developed. ?Well nourished.  ?Vascular  Dorsalis pedis pulses palpable bilaterally. ?Posterior tibial pulses palpable bilaterally. ?Capillary refill normal to all digits.  ?No cyanosis or clubbing noted. ?Pedal hair growth normal.  ?Neurologic Normal speech. ?Oriented to person, place, and time. ?Protective sensation absent  ?Dermatologic Wound Location: Left submetatarsal 4 ulceration with fat layer exposed.  No malodor present no probing down to bone noted.  No redness noted.  Plantarflexed metatarsal noted. ?Wound Base: Mixed Granular/Fibrotic ?Peri-wound: Calloused ?Exudate: Scant/small amount Serosanguinous exudate ?Wound Measurements: ?-See below  ?Orthopedic: No pain to palpation either foot.  ? ?Radiographs: 3 views of skeletally mature adult prior x-rays were reviewed.  No signs of osteomyelitis noted.  No bony abnormalities noted.  Previous hardware noted. ?Assessment:  ? ?1. Ulcer of left foot, limited to breakdown of skin ([place])   ?2. Controlled type 2 diabetes mellitus with hyperglycemia, with long-term current use of insulin ([place])   ? ?Plan:  ?Patient was evaluated and treated and all questions answered. ? ?Ulcer left submetatarsal 4 ulceration with fat layer exposed with underlying plantarflexed fourth metatarsal  ?-Debridement as below. ?-Dressed with Betadine wet-to-dry, DSD. ?-Continue off-loading with surgical shoe. ?-Recent arterial duplex studies show Directed duplex of the bilateral lower extremity demonstrates ?waveforms maintained throughout. ?-Patient will be referred to [facility] wound care in [facility]. ?-He will also benefit from endocrinology follow-up as his A1c and glucose are not controlled. ? ?Procedure: Excisional Debridement of Wound ?Tool: Sharp chisel blade/tissue nipper ?Rationale:  Removal of non-viable soft tissue from the wound to promote healing.  ?Anesthesia: none ?Pre-Debridement Wound Measurements: 1 cm x 0.7 cm x 0.3 cm  ?Post-Debridement Wound Measurements: 1.2 cm x 0.8 cm x 0.3 cm  ?T

## 2021-11-05 ENCOUNTER — Telehealth: Payer: Self-pay | Admitting: *Deleted

## 2021-11-05 NOTE — Telephone Encounter (Signed)
Called and faxed the referral to [facility] they are booking out into September but will send over to doctor for this stat referral, will be up to her discretion as to if he can get in before that time. ?

## 2021-11-06 DIAGNOSIS — E119 Type 2 diabetes mellitus without complications: Secondary | ICD-10-CM | POA: Diagnosis not present

## 2021-11-06 DIAGNOSIS — I10 Essential (primary) hypertension: Secondary | ICD-10-CM | POA: Diagnosis not present

## 2021-11-12 DIAGNOSIS — K2100 Gastro-esophageal reflux disease with esophagitis, without bleeding: Secondary | ICD-10-CM | POA: Diagnosis not present

## 2021-11-12 DIAGNOSIS — L97509 Non-pressure chronic ulcer of other part of unspecified foot with unspecified severity: Secondary | ICD-10-CM | POA: Diagnosis not present

## 2021-11-12 DIAGNOSIS — I209 Angina pectoris, unspecified: Secondary | ICD-10-CM | POA: Diagnosis not present

## 2021-11-12 DIAGNOSIS — E11319 Type 2 diabetes mellitus with unspecified diabetic retinopathy without macular edema: Secondary | ICD-10-CM | POA: Diagnosis not present

## 2021-11-12 DIAGNOSIS — N183 Chronic kidney disease, stage 3 unspecified: Secondary | ICD-10-CM | POA: Diagnosis not present

## 2021-11-12 DIAGNOSIS — E11621 Type 2 diabetes mellitus with foot ulcer: Secondary | ICD-10-CM | POA: Diagnosis not present

## 2021-11-12 DIAGNOSIS — L739 Follicular disorder, unspecified: Secondary | ICD-10-CM | POA: Diagnosis not present

## 2021-11-12 DIAGNOSIS — E063 Autoimmune thyroiditis: Secondary | ICD-10-CM | POA: Diagnosis not present

## 2021-11-12 DIAGNOSIS — Z0001 Encounter for general adult medical examination with abnormal findings: Secondary | ICD-10-CM | POA: Diagnosis not present

## 2021-11-12 DIAGNOSIS — M79606 Pain in leg, unspecified: Secondary | ICD-10-CM | POA: Diagnosis not present

## 2021-11-20 ENCOUNTER — Telehealth: Payer: Self-pay | Admitting: *Deleted

## 2021-11-20 NOTE — Telephone Encounter (Signed)
[facility] wound care in [place](Dr [place]) is wanting to refer patient to Dr Sharol Given for a follow-up. They did not give reasons, no notes available, tried calling but no answer, left message for call back.Please advise. ?

## 2021-11-23 NOTE — Telephone Encounter (Signed)
Butch Penny from [facility] wound center left message to return your message. [phone], ext [phone] ?

## 2021-12-02 ENCOUNTER — Ambulatory Visit: Payer: Medicare Other | Admitting: Podiatry

## 2021-12-02 DIAGNOSIS — L97521 Non-pressure chronic ulcer of other part of left foot limited to breakdown of skin: Secondary | ICD-10-CM | POA: Diagnosis not present

## 2021-12-02 DIAGNOSIS — Z794 Long term (current) use of insulin: Secondary | ICD-10-CM

## 2021-12-02 DIAGNOSIS — E1165 Type 2 diabetes mellitus with hyperglycemia: Secondary | ICD-10-CM | POA: Diagnosis not present

## 2021-12-02 NOTE — Telephone Encounter (Signed)
Called for an appointment status, no answer, left vmessage for call back for [facility] wound center. ?Have faxed referral for Endocrinology to [facility],called as well and  are trying to get patient in as soon as possible, will contact patient to schedule appointment, are normally booking out until Aug. ?

## 2021-12-03 ENCOUNTER — Telehealth: Payer: Self-pay | Admitting: *Deleted

## 2021-12-03 NOTE — Telephone Encounter (Signed)
Mike Floyd is returning a call to [facility] regarding an appt for this patient.  ? ?Please send referral to Fax [phone] and note that they are scheduling 1 to 2 wks out at this time. ?

## 2021-12-03 NOTE — Telephone Encounter (Signed)
[facility]  wound center is calling to let physician know that they are refusing the referral for this patient,Dr Tye [place] is recommending that patient f/u with Dr Sharol Given for [facility] foot management. ?

## 2021-12-04 NOTE — Telephone Encounter (Addendum)
Faxed and called referral to [facility], confirmation received 12/04/21,(scheduled by Shana-12/18/21'@1'$ :30 pm)-patient will be notified by hospital of his upcoming appointment. ?

## 2021-12-06 DIAGNOSIS — E119 Type 2 diabetes mellitus without complications: Secondary | ICD-10-CM | POA: Diagnosis not present

## 2021-12-06 DIAGNOSIS — I10 Essential (primary) hypertension: Secondary | ICD-10-CM | POA: Diagnosis not present

## 2021-12-08 NOTE — Progress Notes (Signed)
?Subjective:  ?Patient ID: Mike Floyd, male    DOB: 19-Jan-1961,  MRN: [id number] ? ?Chief Complaint  ?Patient presents with  ? Foot Ulcer  ? ? ?61 y.o. male presents for wound care.  Patient presents with complaint left submetatarsal fourth ulceration.  Patient states that the wound is about the same.  He states that it hurts with ambulation is very sore.  States glucose are between 3-4 100s.  He has not been able to get in to be seen by endocrinologist. ? ? ?Review of Systems: Negative except as noted in the HPI. Denies N/V/F/Ch. ? ?Past Medical History:  ?Diagnosis Date  ? AICD (automatic cardioverter/defibrillator) present 11/2007  ? a. 11/2007 SJM Current VR - single lead ICD  - Removed 2018 - "it was burning me"  ? Anxiety   ? CAD (coronary artery disease)   ? non-obstructive CAD by Cor CT in 2020 // Myoview 3/22: EF 57, small inf-sept defect c/w scar, no ischemia; low risk     ? Chest pain   ? a. 10/2007 Cath:  normal Cors.  ? CKD (chronic kidney disease), stage II   ? DDD (degenerative disc disease), lumbar   ? Diabetes mellitus DX: 2010  ? Erosive esophagitis   ? a. per EGD (08/2011), Dr. Laural Golden - Erosive reflux esophagitis improved but not completely healed since previous EGD 3 years ago. Bx showing  ulcerated gatroesophageal junction mucosa. negative for H. pylori  ? GERD (gastroesophageal reflux disease)   ? Gout   ? Hearing deficit   ? a. wear bilateral hearing aides  ? History of hiatal hernia   ? Hypertension   ? Mildly dilatd aortic root ([place])   ? CMR 4/22: EF 52, no LGE; d/w Dr. Marlowe Kays root 38 mm (mildly dilated)  ? [facility] 2011  ? Neuropathy   ? Feet and legs  ? Nonischemic dilated cardiomyopathy ([place])   ? a. H/O EF as low as 35-40% by LV gram 10/2007;  b. Echo 02/2011 EF 50-55%, inf HK, Gr 1 DD // CMR 4/22: EF 52, no LGE; d/w Dr. Marlowe Kays root 38 mm (mildly dilated)   ? Renal insufficiency   ? Sleep apnea   ? pt doesnt use, states"I cant afford one". PCP aware  ? Stroke  [facility])   ? mini-stroke in 2014  ? TIA (transient ischemic attack)   ? July, 2013  ? Tobacco abuse, in remission 06/27/2009  ? Discontinued in 2009    ? Wears dentures   ? top plate  ? WPW (Wolff-Parkinson-White syndrome)   ? a. s/p RFCA @ [place]  ? ? ?Current Outpatient Medications:  ?  potassium chloride SA (KLOR-CON M) 20 MEQ tablet, TAKE (1) TABLET BY MOUTH ONCE DAILY., Disp: 90 tablet, Rfl: 3 ?  acarbose (PRECOSE) 100 MG tablet, Take 100 mg by mouth 3 (three) times daily., Disp: , Rfl:  ?  amitriptyline (ELAVIL) 50 MG tablet, Take 50 mg by mouth at bedtime., Disp: , Rfl:  ?  amoxicillin-clavulanate (AUGMENTIN) 875-125 MG tablet, Take 1 tablet by mouth 2 (two) times daily., Disp: 20 tablet, Rfl: 0 ?  aspirin 81 MG tablet, Take 81 mg by mouth daily., Disp: , Rfl:  ?  atorvastatin (LIPITOR) 20 MG tablet, Take 20 mg by mouth daily., Disp: , Rfl:  ?  B-D ULTRAFINE III SHORT PEN 31G X 8 MM MISC, USE WITH TRESBIA., Disp: 100 each, Rfl: 0 ?  calcium carbonate (OSCAL) 1500 (600 Ca) MG TABS  tablet, Take 600 mg of elemental calcium by mouth daily with breakfast., Disp: , Rfl:  ?  carvedilol (COREG) 12.5 MG tablet, TAKE 1 TABLET BY MOUTH TWICE DAILY., Disp: 60 tablet, Rfl: 6 ?  clotrimazole-betamethasone (LOTRISONE) cream, Apply topically 2 (two) times daily., Disp: , Rfl:  ?  famotidine (PEPCID) 20 MG tablet, Take 20 mg by mouth daily., Disp: , Rfl:  ?  FARXIGA 10 MG TABS tablet, Take 10 mg by mouth daily., Disp: , Rfl:  ?  furosemide (LASIX) 40 MG tablet, TAKE (1) TABLET BY MOUTH ONCE DAILY. (Patient taking differently: Take 40 mg by mouth daily.), Disp: 30 tablet, Rfl: 6 ?  gabapentin (NEURONTIN) 600 MG tablet, Take 300 mg by mouth 3 (three) times daily., Disp: , Rfl:  ?  glimepiride (AMARYL) 4 MG tablet, Take 4 mg by mouth 2 (two) times daily. , Disp: , Rfl:  ?  insulin degludec (TRESIBA FLEXTOUCH) 100 UNIT/ML SOPN FlexTouch Pen, Inject 0.3 mLs (30 Units total) into the skin at bedtime. (Patient taking  differently: Inject 52 Units into the skin daily. 50 - 80  based on CBG   AM), Disp: 15 mL, Rfl: 2 ?  isosorbide mononitrate (IMDUR) 30 MG 24 hr tablet, TAKE (1) TABLET BY MOUTH ONCE DAILY. (Patient taking differently: Take 30 mg by mouth daily.), Disp: 60 tablet, Rfl: 0 ?  LORazepam (ATIVAN) 1 MG tablet, Take 1 mg by mouth at bedtime., Disp: , Rfl:  ?  mupirocin ointment (BACTROBAN) 2 %, Apply 1 application topically 2 (two) times daily., Disp: 22 g, Rfl: 0 ?  nitroGLYCERIN (NITROSTAT) 0.4 MG SL tablet, Place 0.4 mg under the tongue every 5 (five) minutes as needed for chest pain., Disp: , Rfl:  ?  omeprazole (PRILOSEC) 40 MG capsule, TAKE (1) CAPSULE BY MOUTH ONCE DAILY., Disp: 90 capsule, Rfl: 0 ?  ONETOUCH VERIO test strip, TESTING 4 TIMES DAILY., Disp: 150 each, Rfl: 5 ?  oxycodone (ROXICODONE) 30 MG immediate release tablet, Take 30-60 mg by mouth 4 (four) times daily as needed for pain., Disp: , Rfl:  ?  oxyCODONE-acetaminophen (PERCOCET) 5-325 MG tablet, Take 1 tablet by mouth every 4 (four) hours as needed., Disp: 30 tablet, Rfl: 0 ?  pantoprazole (PROTONIX) 40 MG tablet, Take 40 mg by mouth 2 (two) times daily., Disp: , Rfl:  ?  SSD 1 % cream, APPLY TO AFFECTED AREA DAILY AS DIRECTED., Disp: 50 g, Rfl: 0 ?  sucralfate (CARAFATE) 1 g tablet, TAKE 1 TABLET BY MOUTH 30 MINTUES BEFORE MEALS AND AT BEDTIME. (Patient taking differently: Take by mouth 4 (four) times daily -  with meals and at bedtime. TAKE 1 TABLET BY MOUTH 30 MINTUES BEFORE MEALS AND AT BEDTIME.), Disp: 120 tablet, Rfl: 0 ?  tamsulosin (FLOMAX) 0.4 MG CAPS capsule, Take 0.4 mg by mouth daily. am, Disp: , Rfl:  ?  vitamin B-12 (CYANOCOBALAMIN) 1000 MCG tablet, Take 1,000 mcg by mouth daily., Disp: , Rfl:  ? ?Social History  ? ?Tobacco Use  ?Smoking Status Former  ? Packs/day: 1.50  ? Years: 35.00  ? Pack years: 52.50  ? Types: Cigarettes  ? Start date: 06/30/1971  ? Quit date: 07/30/2010  ? Years since quitting: 11.3  ?Smokeless Tobacco Former  ?  Types: Chew  ? Quit date: 03/26/1994  ? ? ?No Known Allergies ?Objective:  ?There were no vitals filed for this visit. ?There is no height or weight on file to calculate BMI. ?Constitutional Well developed. ?Well nourished.  ?Vascular  Dorsalis pedis pulses palpable bilaterally. ?Posterior tibial pulses palpable bilaterally. ?Capillary refill normal to all digits.  ?No cyanosis or clubbing noted. ?Pedal hair growth normal.  ?Neurologic Normal speech. ?Oriented to person, place, and time. ?Protective sensation absent  ?Dermatologic Wound Location: Left submetatarsal 4 ulceration with fat layer exposed.  No malodor present no probing down to bone noted.  No redness noted.  Plantarflexed metatarsal noted. ?Wound Base: Mixed Granular/Fibrotic ?Peri-wound: Calloused ?Exudate: Scant/small amount Serosanguinous exudate ?Wound Measurements: ?-See below  ?Orthopedic: No pain to palpation either foot.  ? ?Radiographs: 3 views of skeletally mature adult prior x-rays were reviewed.  No signs of osteomyelitis noted.  No bony abnormalities noted.  Previous hardware noted. ?Assessment:  ? ?1. Ulcer of left foot, limited to breakdown of skin ([place])   ?2. Controlled type 2 diabetes mellitus with hyperglycemia, with long-term current use of insulin ([place])   ? ? ?Plan:  ?Patient was evaluated and treated and all questions answered. ? ?Ulcer left submetatarsal 4 ulceration with fat layer exposed with underlying plantarflexed fourth metatarsal  ?-Debridement as below. ?-Dressed with Betadine wet-to-dry, DSD. ?-Continue off-loading with surgical shoe. ?-Recent arterial duplex studies show Directed duplex of the bilateral lower extremity demonstrates ?waveforms maintained throughout. ?-We will attempt him to refer to wound care center as quickly as possible ?-We will [facility] ? ?Procedure: Excisional Debridement of Wound~stagnant ?Tool: Sharp chisel blade/tissue nipper ?Rationale: Removal of  non-viable soft tissue from the wound to promote healing.  ?Anesthesia: none ?Pre-Debridement Wound Measurements: 1 cm x 0.7 cm x 0.3 cm  ?Post-Debridement Wound Measurements: 1.2 cm x 0.8 cm x 0.3 cm  ?Type of Debridemen

## 2021-12-15 DIAGNOSIS — E1129 Type 2 diabetes mellitus with other diabetic kidney complication: Secondary | ICD-10-CM | POA: Diagnosis not present

## 2021-12-15 DIAGNOSIS — G894 Chronic pain syndrome: Secondary | ICD-10-CM | POA: Diagnosis not present

## 2021-12-15 DIAGNOSIS — I209 Angina pectoris, unspecified: Secondary | ICD-10-CM | POA: Diagnosis not present

## 2021-12-15 DIAGNOSIS — I10 Essential (primary) hypertension: Secondary | ICD-10-CM | POA: Diagnosis not present

## 2021-12-15 DIAGNOSIS — R0609 Other forms of dyspnea: Secondary | ICD-10-CM | POA: Diagnosis not present

## 2021-12-15 DIAGNOSIS — I43 Cardiomyopathy in diseases classified elsewhere: Secondary | ICD-10-CM | POA: Diagnosis not present

## 2021-12-15 DIAGNOSIS — R079 Chest pain, unspecified: Secondary | ICD-10-CM | POA: Diagnosis not present

## 2021-12-15 DIAGNOSIS — N183 Chronic kidney disease, stage 3 unspecified: Secondary | ICD-10-CM | POA: Diagnosis not present

## 2021-12-15 DIAGNOSIS — E118 Type 2 diabetes mellitus with unspecified complications: Secondary | ICD-10-CM | POA: Diagnosis not present

## 2021-12-18 ENCOUNTER — Encounter (HOSPITAL_BASED_OUTPATIENT_CLINIC_OR_DEPARTMENT_OTHER): Payer: Medicare Other | Attending: General Surgery | Admitting: General Surgery

## 2021-12-23 DIAGNOSIS — E1142 Type 2 diabetes mellitus with diabetic polyneuropathy: Secondary | ICD-10-CM | POA: Diagnosis not present

## 2021-12-23 DIAGNOSIS — E1159 Type 2 diabetes mellitus with other circulatory complications: Secondary | ICD-10-CM | POA: Diagnosis not present

## 2021-12-23 DIAGNOSIS — N1831 Chronic kidney disease, stage 3a: Secondary | ICD-10-CM | POA: Diagnosis not present

## 2021-12-23 DIAGNOSIS — E1169 Type 2 diabetes mellitus with other specified complication: Secondary | ICD-10-CM | POA: Diagnosis not present

## 2021-12-23 DIAGNOSIS — E1136 Type 2 diabetes mellitus with diabetic cataract: Secondary | ICD-10-CM | POA: Diagnosis not present

## 2021-12-23 DIAGNOSIS — Z794 Long term (current) use of insulin: Secondary | ICD-10-CM | POA: Diagnosis not present

## 2021-12-23 DIAGNOSIS — E1122 Type 2 diabetes mellitus with diabetic chronic kidney disease: Secondary | ICD-10-CM | POA: Diagnosis not present

## 2022-01-08 ENCOUNTER — Ambulatory Visit: Payer: Medicare Other | Admitting: Podiatry

## 2022-01-08 ENCOUNTER — Telehealth: Payer: Self-pay | Admitting: *Deleted

## 2022-01-08 DIAGNOSIS — Z794 Long term (current) use of insulin: Secondary | ICD-10-CM | POA: Diagnosis not present

## 2022-01-08 DIAGNOSIS — L97521 Non-pressure chronic ulcer of other part of left foot limited to breakdown of skin: Secondary | ICD-10-CM

## 2022-01-08 DIAGNOSIS — E1165 Type 2 diabetes mellitus with hyperglycemia: Secondary | ICD-10-CM

## 2022-01-08 NOTE — Telephone Encounter (Addendum)
Called [facility]ocr[facility]([phone]- Dr Denton Lank), spoke with nurse and she said that they had been trying to reach him, did give her message about diabetic meds being too expensive, she will give message to his doctor as well. Called and left message on patient's voicemail.

## 2022-01-13 ENCOUNTER — Ambulatory Visit (HOSPITAL_COMMUNITY): Payer: Medicare Other | Attending: Internal Medicine | Admitting: Physical Therapy

## 2022-01-13 ENCOUNTER — Encounter (HOSPITAL_COMMUNITY): Payer: Self-pay | Admitting: Physical Therapy

## 2022-01-13 DIAGNOSIS — R2689 Other abnormalities of gait and mobility: Secondary | ICD-10-CM | POA: Insufficient documentation

## 2022-01-13 DIAGNOSIS — L97509 Non-pressure chronic ulcer of other part of unspecified foot with unspecified severity: Secondary | ICD-10-CM | POA: Insufficient documentation

## 2022-01-13 DIAGNOSIS — E08621 Diabetes mellitus due to underlying condition with foot ulcer: Secondary | ICD-10-CM | POA: Insufficient documentation

## 2022-01-13 NOTE — Therapy (Signed)
[place], [place], [address] Phone: [phone]   Fax:  [phone]  Wound Care Evaluation  Patient Details  Name: Mike Floyd MRN: [id number] Date of Birth: 1960/11/29 Referring Provider (PT): Redmond School MD   Encounter Date: 01/13/2022   PT End of Session - 01/13/22 1306     Visit Number 1    Number of Visits 12    Date for PT Re-Evaluation 02/24/22    Authorization Type UHC Medicare ( no auth)    Progress Note Due on Visit 10    PT Start Time 1140    PT Stop Time 1213    PT Time Calculation (min) 33 min    Activity Tolerance Patient tolerated treatment well    Behavior During Therapy [facility] for tasks assessed/performed             Past Medical History:  Diagnosis Date   AICD (automatic cardioverter/defibrillator) present 11/2007   a. 11/2007 SJM Current VR - single lead ICD  - Removed 2018 - "it was burning me"   Anxiety    CAD (coronary artery disease)    non-obstructive CAD by Cor CT in 2020 // Myoview 3/22: EF 57, small inf-sept defect c/w scar, no ischemia; low risk      Chest pain    a. 10/2007 Cath:  normal Cors.   CKD (chronic kidney disease), stage II    DDD (degenerative disc disease), lumbar    Diabetes mellitus DX: 2010   Erosive esophagitis    a. per EGD (08/2011), Dr. Laural Golden - Erosive reflux esophagitis improved but not completely healed since previous EGD 3 years ago. Bx showing  ulcerated gatroesophageal junction mucosa. negative for H. pylori   GERD (gastroesophageal reflux disease)    Gout    Hearing deficit    a. wear bilateral hearing aides   History of hiatal hernia    Hypertension    Mildly dilatd aortic root ([place])    CMR 4/22: EF 52, no LGE; d/w Dr. Marlowe Kays root 38 mm (mildly dilated)   Myocardial infarction [facility]) 2011   Neuropathy    Feet and legs   Nonischemic dilated cardiomyopathy ([place])    a. H/O EF as low as 35-40% by LV gram 10/2007;  b. Echo 02/2011 EF 50-55%,  inf HK, Gr 1 DD // CMR 4/22: EF 52, no LGE; d/w Dr. Marlowe Kays root 38 mm (mildly dilated)    Renal insufficiency    Sleep apnea    pt doesnt use, states"I cant afford one". PCP aware   Stroke ([place])    mini-stroke in 2014   TIA (transient ischemic attack)    July, 2013   Tobacco abuse, in remission 06/27/2009   Discontinued in 2009     Wears dentures    top plate   WPW (Wolff-Parkinson-White syndrome)    a. s/p RFCA @ [place]    Past Surgical History:  Procedure Laterality Date   [place]     2009   [place], LAPAROSCOPIC     11/2007   COLONOSCOPY W/ POLYPECTOMY  2009   ELBOW SURGERY Left 06/2010   ESOPHAGEAL DILATION N/A 11/08/2014   Procedure: ESOPHAGEAL DILATION;  Surgeon: Rogene Houston, MD;  Location: [facility];  Service: Endoscopy;  Laterality: N/A;  #56,    ESOPHAGOGASTRODUODENOSCOPY  03/31/2012   also 08/2011; [name]   ESOPHAGOGASTRODUODENOSCOPY (EGD) WITH PROPOFOL N/A 11/08/2014   Procedure: ESOPHAGOGASTRODUODENOSCOPY (EGD) WITH PROPOFOL;  Surgeon: Rogene Houston, MD;  Location: [facility];  Service: Endoscopy;  Laterality: N/A;  [place] is 78 , [place] is 37   FOOT ARTHRODESIS Left 10/24/2020   Procedure: LEFT GASTROCNEMIUS RECESSION, DORSIFLEXION OSTEOTOMY 1ST MT;  Surgeon: [name], MD;  Location: [place];  Service: Orthopedics;  Laterality: Left;   FOOT ARTHRODESIS Right 01/09/2021   Procedure: CLOSING WEDGE OSTEOTOMY RIGHT 1ST METATARSAL;  Surgeon: [name], MD;  Location: [place];  Service: Orthopedics;  Laterality: Right;   GASTROCNEMIUS RECESSION Right 01/09/2021   Procedure: RIGHT GASTROCNEMIUS RECESSION;  Surgeon: [name], MD;  Location: [place];  Service: Orthopedics;  Laterality: Right;   ICD LEAD REMOVAL N/A 10/07/2016   Procedure: ICD LEAD REMOVAL ;  Surgeon: Evans Lance, MD;  Location: [place];  Service: Cardiovascular;   Laterality: N/A;   [place]; performed at [facility]   TEE WITHOUT CARDIOVERSION N/A 10/07/2016   Procedure: TRANSESOPHAGEAL ECHOCARDIOGRAM (TEE);  Surgeon: Evans Lance, MD;  Location: [facility];  Service: Cardiovascular;  Laterality: N/A;    There were no vitals filed for this visit.     [facility] PT Assessment - 01/13/22 0001       Assessment   Medical Diagnosis Diabetic Foot Ulcer    Referring Provider (PT) Redmond School MD             Wound Therapy - 01/13/22 0001     Subjective Patient has been going to podiatrist for foot wound that has been going on for amost 2-3 years. He had surgery to straighted out his toes and wound never healed and now toes are curling again.    Patient and Family Stated Goals wound to heal    Date of Onset 01/14/20    Prior Treatments podiatrist    Pain Scale 0-10    Pain Score 9     Pain Type Acute pain    Pain Location Foot    Pain Orientation Left    Pain Descriptors / Indicators Throbbing    Evaluation and Treatment Procedures Explained to Patient/Family Yes    Evaluation and Treatment Procedures agreed to    Wound Properties Date First Assessed: 01/13/22 Time First Assessed: 9381 Wound Type: Diabetic ulcer Location: Heel Location Orientation: Left Wound Description (Comments): L plantar foot wound Present on Admission: Yes   Wound Image Images linked: 1    Dressing Type None    Dressing Changed Changed    Dressing Status None    Dressing Change Frequency PRN    Site / Wound Assessment Yellow;Red;Pink;Pale    % Wound base Red or Granulating 90%    % Wound base Yellow/Fibrinous Exudate 10%    Peri-wound Assessment Other (Comment)   callus   Wound Length (cm) 1.4 cm    Wound Width (cm) 0.6 cm    Wound Depth (cm) 0.4 cm    Wound Volume (cm^3) 0.34 cm^3    Wound Surface Area (cm^2) 0.84 cm^2    Drainage Amount None    Drainage Description Serous    Treatment Cleansed;Debridement  (Selective)    Selective Debridement - Location L foot    Selective Debridement - Tools Used Forceps;Scalpel    Selective Debridement - Tissue Removed callous, slough    Wound Therapy - Clinical Statement  Patient with nonhealing diabetic foot wound that has been present for several years. Wound not dressed upon patient's arrival with dirt/fuzz in wound bed. Debrided debris from wound bed and some callus from perimeter but debridment limited by pain. Dressed wound with vaseline to perimeter, medihoney to wound bed, 2x2, medipore tape. Educated on keeping dressing clean and dry but changing if soiled. Patient will benefit from PT in order promote wound healing.    Wound Therapy - Functional Problem List ambulation, balance    Factors Delaying/Impairing Wound Healing Altered sensation;Diabetes Mellitus;Immobility;Multiple medical problems;Vascular compromise    Hydrotherapy Plan Debridement;Dressing change;Electrical stimulation;Patient/family education;Pulsatile lavage with suction;Ultrasonic wound therapy '@35'$  KHz (+/- 3 KHz)    Wound Therapy - Frequency 2X / week    Wound Therapy - Current Recommendations PT    Wound Plan debride and dressing changes PRN    Dressing  vaseline to perimeter, medihoney, 2x2, medipore                   Objective measurements completed on examination: See above findings.               PT Short Term Goals - 01/13/22 1309       PT SHORT TERM GOAL #1   Title Periwound will be free from callus.    Time 3    Period Weeks    Status New    Target Date 02/03/22               PT Long Term Goals - 01/13/22 1310       PT LONG TERM GOAL #1   Title Wound will be healed to reduce risk of infection.    Time 6    Period Weeks    Status New    Target Date 02/24/22                   Plan - 01/13/22 1308     Clinical Impression Statement see above    Personal Factors and Comorbidities Comorbidity 3+;Fitness;Time since onset of  injury/illness/exacerbation;Past/Current Experience    Comorbidities DM, CKD, CAD, hx MI, hx TIA    Examination-Activity Limitations Locomotion Level;Transfers;Squat;Hygiene/Grooming    Examination-Participation Restrictions Cleaning;Community Activity;Shop;Volunteer;Laundry;Yard Work    [employer] Evolving/Moderate complexity    Clinical Decision Making Moderate    Rehab Potential Fair    PT Frequency 2x / week    PT Duration 6 weeks    PT Treatment/Interventions ADLs/Self Care Home Management;Gait training;Stair training;Functional mobility training;Therapeutic activities;Therapeutic exercise;Balance training;Neuromuscular re-education;Patient/family education;Orthotic Fit/Training;Manual lymph drainage;Manual techniques;Taping;Scar mobilization;Compression bandaging;DME Instruction    Consulted and Agree with Plan of Care Patient             Patient will benefit from skilled therapeutic intervention in order to improve the following deficits and impairments:  Abnormal gait, Difficulty walking, Decreased endurance, Decreased activity tolerance, Decreased skin integrity, Pain, Decreased balance, Improper body mechanics, Decreased mobility, Decreased strength  Visit Diagnosis: Other abnormalities of gait and mobility  Diabetic foot ulcer associated with diabetes mellitus due to underlying condition, unspecified laterality, unspecified part of foot, unspecified ulcer stage (HCC)    Problem List Patient Active Problem List   Diagnosis Date Noted   Non-pressure chronic ulcer of other part of right foot limited to breakdown of skin (HCC)    Contracture of right Achilles tendon    Mildly dilatd aortic root (HCC)    Contracture of left Achilles tendon    Metatarsal deformity, left    CAD (  coronary artery disease)    Hallux hammertoe, left 12/11/2019   Diarrhea 06/27/2019   Diastasis recti 40/98/1191   Umbilical hernia without obstruction and without gangrene  08/22/2018   Vitamin D deficiency 06/23/2017   Essential hypertension, benign 06/15/2017   Mixed hyperlipidemia 06/15/2017   Class 1 obesity due to excess calories with serious comorbidity and body mass index (BMI) of 31.0 to 31.9 in adult 06/15/2017   Malfunction of implantable cardioverter-defibrillator (ICD) electrode 10/07/2016   Chronic kidney disease (CKD), stage III (moderate) (HCC) 04/04/2015   Bladder neck obstruction 03/21/2015   Difficult or painful urination 03/21/2015   Flank pain 03/21/2015   Delayed onset of urination 03/21/2015   Calculus of kidney 03/21/2015   Erosive esophagitis 12/27/2013   Hypotension due to drugs 47/82/9562   Chronic systolic heart failure ([place]) 04/24/2013   CAP (community acquired pneumonia) 03/16/2013   HTN (hypertension) 03/15/2013   Hydrocele 03/15/2013   Spermatocele 03/15/2013   DOE (dyspnea on exertion) 03/15/2013   OSA (obstructive sleep apnea) 01/09/2013   Chronic kidney disease, stage 3 ([place]) 01/03/2013   Chest pain 12/26/2012   History of diagnostic tests 09/06/2012   TIA (transient ischemic attack)    Cardiomyopathy, nonischemic ([place]) 02/24/2010   AICD (automatic cardioverter/defibrillator) 02/24/2010   DM type 2 causing vascular disease ([place]) 06/27/2009   Gout 06/27/2009   Tobacco abuse, in remission 06/27/2009   COLONIC POLYPS 10/31/2008   GERD (gastroesophageal reflux disease) 10/31/2008    Mearl Latin, PT 01/13/2022, 2:09 PM  [place] [address] [place], [place], [address] Phone: [phone]   Fax:  [phone]  Name: JOSAIAH MUHAMMED MRN: [id number] Date of Birth: 1961/01/11

## 2022-01-13 NOTE — Progress Notes (Signed)
Subjective:  Patient ID: Mike Floyd, male    DOB: 1961-07-31,  MRN: [id number]  Chief Complaint  Patient presents with   Foot Ulcer    61 y.o. male presents for wound care.  Patient presents with complaint left submetatarsal fourth ulceration.  Patient states that the wound is about the same.  He states that it hurts with ambulation is very sore.  He states that he is seeing an endocrinologist.  He also has a wound care appointment set up for next week.  He denies any other acute complaints.   Review of Systems: Negative except as noted in the HPI. Denies N/V/F/Ch.  Past Medical History:  Diagnosis Date   AICD (automatic cardioverter/defibrillator) present 11/2007   a. 11/2007 SJM Current VR - single lead ICD  - Removed 2018 - "it was burning me"   Anxiety    CAD (coronary artery disease)    non-obstructive CAD by Cor CT in 2020 // Myoview 3/22: EF 57, small inf-sept defect c/w scar, no ischemia; low risk      Chest pain    a. 10/2007 Cath:  normal Cors.   CKD (chronic kidney disease), stage II    DDD (degenerative disc disease), lumbar    Diabetes mellitus DX: 2010   Erosive esophagitis    a. per EGD (08/2011), Dr. Laural Golden - Erosive reflux esophagitis improved but not completely healed since previous EGD 3 years ago. Bx showing  ulcerated gatroesophageal junction mucosa. negative for H. pylori   GERD (gastroesophageal reflux disease)    Gout    Hearing deficit    a. wear bilateral hearing aides   History of hiatal hernia    Hypertension    Mildly dilatd aortic root ([place])    CMR 4/22: EF 52, no LGE; d/w Dr. Marlowe Kays root 38 mm (mildly dilated)   Myocardial infarction [facility]) 2011   Neuropathy    Feet and legs   Nonischemic dilated cardiomyopathy ([place])    a. H/O EF as low as 35-40% by LV gram 10/2007;  b. Echo 02/2011 EF 50-55%, inf HK, Gr 1 DD // CMR 4/22: EF 52, no LGE; d/w Dr. Marlowe Kays root 38 mm (mildly dilated)    Renal insufficiency    Sleep apnea    pt doesnt use,  states"I cant afford one". PCP aware   Stroke ([place])    mini-stroke in 2014   TIA (transient ischemic attack)    July, 2013   Tobacco abuse, in remission 06/27/2009   Discontinued in 2009     Wears dentures    top plate   WPW (Wolff-Parkinson-White syndrome)    a. s/p RFCA @ [facility] - 1999    Current Outpatient Medications:    potassium chloride SA (KLOR-CON M) 20 MEQ tablet, TAKE (1) TABLET BY MOUTH ONCE DAILY., Disp: 90 tablet, Rfl: 3   acarbose (PRECOSE) 100 MG tablet, Take 100 mg by mouth 3 (three) times daily., Disp: , Rfl:    amitriptyline (ELAVIL) 50 MG tablet, Take 50 mg by mouth at bedtime., Disp: , Rfl:    amoxicillin-clavulanate (AUGMENTIN) 875-125 MG tablet, Take 1 tablet by mouth 2 (two) times daily., Disp: 20 tablet, Rfl: 0   aspirin 81 MG tablet, Take 81 mg by mouth daily., Disp: , Rfl:    atorvastatin (LIPITOR) 20 MG tablet, Take 20 mg by mouth daily., Disp: , Rfl:    B-D ULTRAFINE III SHORT PEN 31G X 8 MM MISC, USE WITH TRESBIA., Disp: 100 each, Rfl: 0  calcium carbonate (OSCAL) 1500 (600 Ca) MG TABS tablet, Take 600 mg of elemental calcium by mouth daily with breakfast., Disp: , Rfl:    carvedilol (COREG) 12.5 MG tablet, TAKE 1 TABLET BY MOUTH TWICE DAILY., Disp: 60 tablet, Rfl: 6   clotrimazole-betamethasone (LOTRISONE) cream, Apply topically 2 (two) times daily., Disp: , Rfl:    famotidine (PEPCID) 20 MG tablet, Take 20 mg by mouth daily., Disp: , Rfl:    FARXIGA 10 MG TABS tablet, Take 10 mg by mouth daily., Disp: , Rfl:    furosemide (LASIX) 40 MG tablet, TAKE (1) TABLET BY MOUTH ONCE DAILY. (Patient taking differently: Take 40 mg by mouth daily.), Disp: 30 tablet, Rfl: 6   gabapentin (NEURONTIN) 600 MG tablet, Take 300 mg by mouth 3 (three) times daily., Disp: , Rfl:    glimepiride (AMARYL) 4 MG tablet, Take 4 mg by mouth 2 (two) times daily. , Disp: , Rfl:    insulin degludec (TRESIBA FLEXTOUCH) 100 UNIT/ML SOPN FlexTouch Pen, Inject 0.3 mLs (30 Units total) into  the skin at bedtime. (Patient taking differently: Inject 52 Units into the skin daily. 50 - 80  based on CBG   AM), Disp: 15 mL, Rfl: 2   isosorbide mononitrate (IMDUR) 30 MG 24 hr tablet, TAKE (1) TABLET BY MOUTH ONCE DAILY. (Patient taking differently: Take 30 mg by mouth daily.), Disp: 60 tablet, Rfl: 0   LORazepam (ATIVAN) 1 MG tablet, Take 1 mg by mouth at bedtime., Disp: , Rfl:    mupirocin ointment (BACTROBAN) 2 %, Apply 1 application topically 2 (two) times daily., Disp: 22 g, Rfl: 0   nitroGLYCERIN (NITROSTAT) 0.4 MG SL tablet, Place 0.4 mg under the tongue every 5 (five) minutes as needed for chest pain., Disp: , Rfl:    omeprazole (PRILOSEC) 40 MG capsule, TAKE (1) CAPSULE BY MOUTH ONCE DAILY., Disp: 90 capsule, Rfl: 0   ONETOUCH VERIO test strip, TESTING 4 TIMES DAILY., Disp: 150 each, Rfl: 5   oxycodone (ROXICODONE) 30 MG immediate release tablet, Take 30-60 mg by mouth 4 (four) times daily as needed for pain., Disp: , Rfl:    pantoprazole (PROTONIX) 40 MG tablet, Take 40 mg by mouth 2 (two) times daily., Disp: , Rfl:    SSD 1 % cream, APPLY TO AFFECTED AREA DAILY AS DIRECTED., Disp: 50 g, Rfl: 0   sucralfate (CARAFATE) 1 g tablet, TAKE 1 TABLET BY MOUTH 30 MINTUES BEFORE MEALS AND AT BEDTIME. (Patient taking differently: Take by mouth 4 (four) times daily -  with meals and at bedtime. TAKE 1 TABLET BY MOUTH 30 MINTUES BEFORE MEALS AND AT BEDTIME.), Disp: 120 tablet, Rfl: 0   tamsulosin (FLOMAX) 0.4 MG CAPS capsule, Take 0.4 mg by mouth daily. am, Disp: , Rfl:    vitamin B-12 (CYANOCOBALAMIN) 1000 MCG tablet, Take 1,000 mcg by mouth daily., Disp: , Rfl:   Social History   Tobacco Use  Smoking Status Former   Packs/day: 1.50   Years: 35.00   Pack years: 52.50   Types: Cigarettes   Start date: 06/30/1971   Quit date: 07/30/2010   Years since quitting: 11.4  Smokeless Tobacco Former   Types: Chew   Quit date: 03/26/1994    No Known Allergies Objective:  There were no vitals  filed for this visit. There is no height or weight on file to calculate BMI. Constitutional Well developed. Well nourished.  Vascular Dorsalis pedis pulses palpable bilaterally. Posterior tibial pulses palpable bilaterally. Capillary refill normal to all  digits.  No cyanosis or clubbing noted. Pedal hair growth normal.  Neurologic Normal speech. Oriented to person, place, and time. Protective sensation absent  Dermatologic Wound Location: Left submetatarsal 4 ulceration with fat layer exposed.  No malodor present no probing down to bone noted.  No redness noted.  Plantarflexed metatarsal noted. Wound Base: Mixed Granular/Fibrotic Peri-wound: Calloused Exudate: Scant/small amount Serosanguinous exudate Wound Measurements: -See below  Orthopedic: No pain to palpation either foot.   Radiographs: 3 views of skeletally mature adult prior x-rays were reviewed.  No signs of osteomyelitis noted.  No bony abnormalities noted.  Previous hardware noted. Assessment:   1. Ulcer of left foot, limited to breakdown of skin ([place])   2. Controlled type 2 diabetes mellitus with hyperglycemia, with long-term current use of insulin ([place])      Plan:  Patient was evaluated and treated and all questions answered.  Ulcer left submetatarsal 4 ulceration with fat layer exposed with underlying plantarflexed fourth metatarsal  -Debridement as below. -Dressed with Betadine wet-to-dry, DSD. -Continue off-loading with surgical shoe. -Recent arterial duplex studies show Directed duplex of the bilateral lower extremity demonstrates waveforms maintained throughout. -He is scheduled for wound care appointment next week. -Diabetic glucose control per endocrinology  Procedure: Excisional Debridement of Wound~stagnant Tool: Sharp chisel blade/tissue nipper Rationale: Removal of non-viable soft tissue from the wound to promote healing.  Anesthesia: none Pre-Debridement Wound Measurements: 1 cm x 0.7 cm x 0.3 cm   Post-Debridement Wound Measurements: 1.2 cm x 0.8 cm x 0.3 cm  Type of Debridement: Sharp Excisional Tissue Removed: Non-viable soft tissue Blood loss: Minimal (<50cc) Depth of Debridement: subcutaneous tissue. Technique: Sharp excisional debridement to bleeding, viable wound base.  Wound Progress: The wound continues to be stagnant without decreasing in size. Site healing conversation 7 Dressing: Dry, sterile, compression dressing. Disposition: Patient tolerated procedure well. Patient to return in 1 week for follow-up.  No follow-ups on file.

## 2022-01-14 DIAGNOSIS — L97509 Non-pressure chronic ulcer of other part of unspecified foot with unspecified severity: Secondary | ICD-10-CM | POA: Diagnosis not present

## 2022-01-14 DIAGNOSIS — I43 Cardiomyopathy in diseases classified elsewhere: Secondary | ICD-10-CM | POA: Diagnosis not present

## 2022-01-14 DIAGNOSIS — N183 Chronic kidney disease, stage 3 unspecified: Secondary | ICD-10-CM | POA: Diagnosis not present

## 2022-01-14 DIAGNOSIS — E1140 Type 2 diabetes mellitus with diabetic neuropathy, unspecified: Secondary | ICD-10-CM | POA: Diagnosis not present

## 2022-01-14 DIAGNOSIS — I10 Essential (primary) hypertension: Secondary | ICD-10-CM | POA: Diagnosis not present

## 2022-01-14 DIAGNOSIS — G894 Chronic pain syndrome: Secondary | ICD-10-CM | POA: Diagnosis not present

## 2022-01-14 DIAGNOSIS — E063 Autoimmune thyroiditis: Secondary | ICD-10-CM | POA: Diagnosis not present

## 2022-01-14 DIAGNOSIS — I77810 Thoracic aortic ectasia: Secondary | ICD-10-CM | POA: Diagnosis not present

## 2022-01-15 ENCOUNTER — Ambulatory Visit (HOSPITAL_COMMUNITY): Payer: Medicare Other | Attending: Internal Medicine | Admitting: Physical Therapy

## 2022-01-15 DIAGNOSIS — R269 Unspecified abnormalities of gait and mobility: Secondary | ICD-10-CM | POA: Diagnosis not present

## 2022-01-15 DIAGNOSIS — R2689 Other abnormalities of gait and mobility: Secondary | ICD-10-CM

## 2022-01-15 DIAGNOSIS — L97511 Non-pressure chronic ulcer of other part of right foot limited to breakdown of skin: Secondary | ICD-10-CM | POA: Diagnosis not present

## 2022-01-15 DIAGNOSIS — E08621 Diabetes mellitus due to underlying condition with foot ulcer: Secondary | ICD-10-CM

## 2022-01-15 NOTE — Therapy (Signed)
[facility] [place], [place], [address] Phone: [phone]   Fax:  [phone]  Wound Care Therapy  Patient Details  Name: Mike [facility] MRN: [id number] Date of Birth: 07-03-1961 Referring Provider (PT): Redmond School MD   Encounter Date: 01/15/2022   PT End of Session - 01/15/22 1440     Visit Number 2    Number of Visits 12    Date for PT Re-Evaluation 02/24/22    Authorization Type UHC Medicare ( no auth)    Progress Note Due on Visit 10    PT Start Time 0200    PT Stop Time 0240    PT Time Calculation (min) 40 min    Activity Tolerance Patient tolerated treatment well    Behavior During Therapy Mike Floyd for tasks assessed/performed             Past Medical History:  Diagnosis Date   AICD (automatic cardioverter/defibrillator) present 11/2007   a. 11/2007 SJM Current VR - single lead ICD  - Removed 2018 - "it was burning me"   Anxiety    CAD (coronary artery disease)    non-obstructive CAD by Cor CT in 2020 // Myoview 3/22: EF 57, small inf-sept defect c/w scar, no ischemia; low risk      Chest pain    a. 10/2007 Cath:  normal Cors.   CKD (chronic kidney disease), stage II    DDD (degenerative disc disease), lumbar    Diabetes mellitus DX: 2010   Erosive esophagitis    a. per EGD (08/2011), Dr. Laural Golden - Erosive reflux esophagitis improved but not completely healed since previous EGD 3 years ago. Bx showing  ulcerated gatroesophageal junction mucosa. negative for H. pylori   GERD (gastroesophageal reflux disease)    Gout    Hearing deficit    a. wear bilateral hearing aides   History of hiatal hernia    Hypertension    Mildly dilatd aortic root ([place])    CMR 4/22: EF 52, no LGE; d/w Dr. Marlowe Kays root 38 mm (mildly dilated)   Myocardial infarction [facility]) 2011   Neuropathy    Feet and legs   Nonischemic dilated cardiomyopathy ([place])    a. H/O EF as low as 35-40% by LV gram 10/2007;  b. Echo 02/2011 EF 50-55%, inf  HK, Gr 1 DD // CMR 4/22: EF 52, no LGE; d/w Dr. Marlowe Kays root 38 mm (mildly dilated)    Renal insufficiency    Sleep apnea    pt doesnt use, states"I cant afford one". PCP aware   Stroke ([place])    mini-stroke in 2014   TIA (transient ischemic attack)    July, 2013   Tobacco abuse, in remission 06/27/2009   Discontinued in 2009     Wears dentures    top plate   WPW (Wolff-Parkinson-White syndrome)    a. s/p RFCA @ [place]    Past Surgical History:  Procedure Laterality Date   [place]     2009   [place], LAPAROSCOPIC     11/2007   COLONOSCOPY W/ POLYPECTOMY  2009   ELBOW SURGERY Left 06/2010   ESOPHAGEAL DILATION N/A 11/08/2014   Procedure: ESOPHAGEAL DILATION;  Surgeon: Rogene Houston, MD;  Location: [facility];  Service: Endoscopy;  Laterality: N/A;  #56,    ESOPHAGOGASTRODUODENOSCOPY  03/31/2012   also 08/2011; [name]   ESOPHAGOGASTRODUODENOSCOPY (EGD) WITH PROPOFOL N/A 11/08/2014   Procedure: ESOPHAGOGASTRODUODENOSCOPY (EGD) WITH PROPOFOL;  Surgeon: Rogene Houston, MD;  Location: [facility];  Service: Endoscopy;  Laterality: N/A;  Hiatus is 35 , GE Junction is 37   FOOT ARTHRODESIS Left 10/24/2020   Procedure: LEFT GASTROCNEMIUS RECESSION, DORSIFLEXION OSTEOTOMY 1ST MT;  Surgeon: [name], MD;  Location: [facility];  Service: Orthopedics;  Laterality: Left;   FOOT ARTHRODESIS Right 01/09/2021   Procedure: CLOSING WEDGE OSTEOTOMY RIGHT 1ST METATARSAL;  Surgeon: [name], MD;  Location: [facility];  Service: Orthopedics;  Laterality: Right;   GASTROCNEMIUS RECESSION Right 01/09/2021   Procedure: RIGHT GASTROCNEMIUS RECESSION;  Surgeon: [name], MD;  Location: [facility];  Service: Orthopedics;  Laterality: Right;   ICD LEAD REMOVAL N/A 10/07/2016   Procedure: ICD LEAD REMOVAL ;  Surgeon: Evans Lance, MD;  Location: [facility];  Service: Cardiovascular;  Laterality:  N/A;   [place]; performed at [facility]   TEE WITHOUT CARDIOVERSION N/A 10/07/2016   Procedure: TRANSESOPHAGEAL ECHOCARDIOGRAM (TEE);  Surgeon: Evans Lance, MD;  Location[facility] OR;  Service: Cardiovascular;  Laterality: N/A;    There were no vitals filed for this visit.               Wound Therapy - 01/15/22 0001     Subjective PT state his foot feels like there is a poker jabbing into it.    Patient and Family Stated Goals wound to heal    Date of Onset 01/14/20    Prior Treatments podiatrist    Pain Scale 0-10    Pain Score 8     Pain Type Acute pain    Pain Location Foot    Pain Orientation Left    Pain Descriptors / Indicators Throbbing    Evaluation and Treatment Procedures Explained to Patient/Family Yes    Evaluation and Treatment Procedures agreed to    Wound Properties Date First Assessed: 01/13/22 Time First Assessed: 8588 Wound Type: Diabetic ulcer Location: Heel Location Orientation: Left Wound Description (Comments): L plantar foot wound Present on Admission: Yes   Dressing Type None    Dressing Changed Changed    Dressing Status None    Dressing Change Frequency PRN    Site / Wound Assessment Yellow;Red;Pink;Pale    % Wound base Red or Granulating 90%    % Wound base Yellow/Fibrinous Exudate 10%    Peri-wound Assessment Other (Comment)   callus   Drainage Amount None    Treatment Cleansed;Debridement (Selective)    Selective Debridement - Location L foot    Selective Debridement - Tools Used Forceps;Scalpel;Scissors    Selective Debridement - Tissue Removed callous, slough    Wound Therapy - Clinical Statement Therapist able to remove significant amount of callous surrounding his wound.  Slough removed from wound bed as well uncovering granulated tissue.  Pt will continue to benefit from skilled PT for debridement of nonvitalized tissue to promote healing and reduce the risk of infection.     Wound Therapy - Functional Problem List ambulation, balance    Factors Delaying/Impairing Wound Healing Altered sensation;Diabetes Mellitus;Immobility;Multiple medical problems;Vascular compromise    Hydrotherapy Plan Debridement;Dressing change;Electrical stimulation;Patient/family education;Pulsatile lavage with suction;Ultrasonic wound therapy '@35'$  KHz (+/- 3 KHz)    Wound Therapy - Frequency 2X / week    Wound Therapy - Current Recommendations PT  Wound Plan debride and dressing changes PRN    Dressing  vaseline to perimeter, medihoney, 2x2, medipore                       PT Short Term Goals - 01/15/22 1444       PT SHORT TERM GOAL #1   Title Periwound will be free from callus.    Time 3    Period Weeks    Status On-going    Target Date 02/03/22               PT Long Term Goals - 01/15/22 1444       PT LONG TERM GOAL #1   Title Wound will be healed to reduce risk of infection.    Time 6    Period Weeks    Status On-going    Target Date 02/24/22                   Plan - 01/15/22 1444     Clinical Impression Statement as above    Personal Factors and Comorbidities Comorbidity 3+;Fitness;Time since onset of injury/illness/exacerbation;Past/Current Experience    Comorbidities DM, CKD, CAD, hx MI, hx TIA    Examination-Activity Limitations Locomotion Level;Transfers;Squat;Hygiene/Grooming    Examination-Participation Restrictions Cleaning;Community Activity;Shop;Volunteer;Laundry;Yard Work    Merchant [employer] officer Evolving/Moderate complexity    Rehab Potential Fair    PT Frequency 2x / week    PT Duration 6 weeks    PT Treatment/Interventions ADLs/Self Care Home Management;Gait training;Stair training;Functional mobility training;Therapeutic activities;Therapeutic exercise;Balance training;Neuromuscular re-education;Patient/family education;Orthotic Fit/Training;Manual lymph drainage;Manual techniques;Taping;Scar  mobilization;Compression bandaging;DME Instruction    Consulted and Agree with Plan of Care Patient             Patient will benefit from skilled therapeutic intervention in order to improve the following deficits and impairments:  Abnormal gait, Difficulty walking, Decreased endurance, Decreased activity tolerance, Decreased skin integrity, Pain, Decreased balance, Improper body mechanics, Decreased mobility, Decreased strength  Visit Diagnosis: Other abnormalities of gait and mobility  Diabetic foot ulcer associated with diabetes mellitus due to underlying condition, unspecified laterality, unspecified part of foot, unspecified ulcer stage ([place])     Problem List Patient Active Problem List   Diagnosis Date Noted   Non-pressure chronic ulcer of other part of right foot limited to breakdown of skin ([place])    Contracture of right Achilles tendon    Mildly dilatd aortic root ([facility])    Contracture of left Achilles tendon    Metatarsal deformity, left    CAD (coronary artery disease)    Hallux hammertoe, left 12/11/2019   Diarrhea 06/27/2019   Diastasis recti 09/81/1914   Umbilical hernia without obstruction and without gangrene 08/22/2018   Vitamin D deficiency 06/23/2017   Essential hypertension, benign 06/15/2017   Mixed hyperlipidemia 06/15/2017   Class 1 obesity due to excess calories with serious comorbidity and body mass index (BMI) of 31.0 to 31.9 in adult 06/15/2017   Malfunction of implantable cardioverter-defibrillator (ICD) electrode 10/07/2016   Chronic kidney disease (CKD), stage III (moderate) ([place]) 04/04/2015   Bladder neck obstruction 03/21/2015   Difficult or painful urination 03/21/2015   Flank pain 03/21/2015   Delayed onset of urination 03/21/2015   Calculus of kidney 03/21/2015   Erosive esophagitis 12/27/2013   Hypotension due to drugs 78/29/5621   Chronic systolic heart failure ([place]) 04/24/2013   CAP (community acquired pneumonia) 03/16/2013   HTN  (hypertension) 03/15/2013   Hydrocele 03/15/2013   Spermatocele 03/15/2013  DOE (dyspnea on exertion) 03/15/2013   OSA (obstructive sleep apnea) 01/09/2013   Chronic kidney disease, stage 3 ([place]) 01/03/2013   Chest pain 12/26/2012   History of diagnostic tests 09/06/2012   TIA (transient ischemic attack)    Cardiomyopathy, nonischemic ([place]) 02/24/2010   AICD (automatic cardioverter/defibrillator) 02/24/2010   DM type 2 causing vascular disease ([place]) 06/27/2009   Gout 06/27/2009   Tobacco abuse, in remission 06/27/2009   COLONIC POLYPS 10/31/2008   GERD (gastroesophageal reflux disease) 10/31/2008   Mike [facility], PT CLT [phone]  01/15/2022, 2:45 PM  [facility] [address] [place], [place], [address] Phone: [phone]   Fax:  [phone]  Name: Mike [facility] MRN: [id number] Date of Birth: 07/25/1961

## 2022-01-18 ENCOUNTER — Encounter (HOSPITAL_COMMUNITY): Payer: Self-pay | Admitting: Physical Therapy

## 2022-01-18 ENCOUNTER — Ambulatory Visit (HOSPITAL_COMMUNITY): Payer: Medicare Other | Attending: Internal Medicine | Admitting: Physical Therapy

## 2022-01-18 DIAGNOSIS — R269 Unspecified abnormalities of gait and mobility: Secondary | ICD-10-CM | POA: Diagnosis not present

## 2022-01-18 DIAGNOSIS — M79671 Pain in right foot: Secondary | ICD-10-CM | POA: Diagnosis not present

## 2022-01-18 DIAGNOSIS — E11621 Type 2 diabetes mellitus with foot ulcer: Secondary | ICD-10-CM | POA: Diagnosis not present

## 2022-01-18 DIAGNOSIS — R2689 Other abnormalities of gait and mobility: Secondary | ICD-10-CM

## 2022-01-18 DIAGNOSIS — R262 Difficulty in walking, not elsewhere classified: Secondary | ICD-10-CM | POA: Diagnosis not present

## 2022-01-18 DIAGNOSIS — E08621 Diabetes mellitus due to underlying condition with foot ulcer: Secondary | ICD-10-CM

## 2022-01-18 DIAGNOSIS — L97511 Non-pressure chronic ulcer of other part of right foot limited to breakdown of skin: Secondary | ICD-10-CM | POA: Insufficient documentation

## 2022-01-18 NOTE — Therapy (Signed)
[facility] [place], [place], [address] Phone: [phone]   Fax:  [phone]  Wound Care Therapy  Patient Details  Name: Mike [facility] MRN: [id number] Date of Birth: 15-Nov-1960 Referring Provider (PT): Redmond School MD   Encounter Date: 01/18/2022   PT End of Session - 01/18/22 1133     Visit Number 3    Number of Visits 12    Date for PT Re-Evaluation 02/24/22    Authorization Type UHC Medicare ( no auth)    Progress Note Due on Visit 10    PT Start Time 1133    PT Stop Time 1207    PT Time Calculation (min) 34 min    Activity Tolerance Patient tolerated treatment well    Behavior During Therapy [facility] for tasks assessed/performed             Past Medical History:  Diagnosis Date   AICD (automatic cardioverter/defibrillator) present 11/2007   a. 11/2007 SJM Current VR - single lead ICD  - Removed 2018 - "it was burning me"   Anxiety    CAD (coronary artery disease)    non-obstructive CAD by Cor CT in 2020 // Myoview 3/22: EF 57, small inf-sept defect c/w scar, no ischemia; low risk      Chest pain    a. 10/2007 Cath:  normal Cors.   CKD (chronic kidney disease), stage II    DDD (degenerative disc disease), lumbar    Diabetes mellitus DX: 2010   Erosive esophagitis    a. per EGD (08/2011), Dr. Laural Golden - Erosive reflux esophagitis improved but not completely healed since previous EGD 3 years ago. Bx showing  ulcerated gatroesophageal junction mucosa. negative for H. pylori   GERD (gastroesophageal reflux disease)    Gout    Hearing deficit    a. wear bilateral hearing aides   History of hiatal hernia    Hypertension    Mildly dilatd aortic root ([place])    CMR 4/22: EF 52, no LGE; d/w Dr. Marlowe Kays root 38 mm (mildly dilated)   Myocardial infarction [facility]) 2011   Neuropathy    Feet and legs   Nonischemic dilated cardiomyopathy ([place])    a. H/O EF as low as 35-40% by LV gram 10/2007;  b. Echo 02/2011 EF 50-55%,  inf HK, Gr 1 DD // CMR 4/22: EF 52, no LGE; d/w Dr. Marlowe Kays root 38 mm (mildly dilated)    Renal insufficiency    Sleep apnea    pt doesnt use, states"I cant afford one". PCP aware   Stroke ([place])    mini-stroke in 2014   TIA (transient ischemic attack)    July, 2013   Tobacco abuse, in remission 06/27/2009   Discontinued in 2009     Wears dentures    top plate   WPW (Wolff-Parkinson-White syndrome)    a. s/p RFCA @ [place]    Past Surgical History:  Procedure Laterality Date   [place]     2009   [place], LAPAROSCOPIC     11/2007   COLONOSCOPY W/ POLYPECTOMY  2009   ELBOW SURGERY Left 06/2010   ESOPHAGEAL DILATION N/A 11/08/2014   Procedure: ESOPHAGEAL DILATION;  Surgeon: Rogene Houston, MD;  Location: [facility];  Service: Endoscopy;  Laterality: N/A;  #56,    ESOPHAGOGASTRODUODENOSCOPY  03/31/2012   also 08/2011; [name]   ESOPHAGOGASTRODUODENOSCOPY (EGD) WITH PROPOFOL N/A 11/08/2014   Procedure: ESOPHAGOGASTRODUODENOSCOPY (EGD) WITH PROPOFOL;  Surgeon: Rogene Houston, MD;  Location[facility];  Service: Endoscopy;  Laterality: N/A;  Hiatus is 62 , GE Junction is 37   FOOT ARTHRODESIS Left 10/24/2020   Procedure: LEFT GASTROCNEMIUS RECESSION, DORSIFLEXION OSTEOTOMY 1ST MT;  Surgeon: [name], MD;  Location: [facility];  Service: Orthopedics;  Laterality: Left;   FOOT ARTHRODESIS Right 01/09/2021   Procedure: CLOSING WEDGE OSTEOTOMY RIGHT 1ST METATARSAL;  Surgeon: [name], MD;  Location: [facility];  Service: Orthopedics;  Laterality: Right;   GASTROCNEMIUS RECESSION Right 01/09/2021   Procedure: RIGHT GASTROCNEMIUS RECESSION;  Surgeon: [name], MD;  Location: [facility];  Service: Orthopedics;  Laterality: Right;   ICD LEAD REMOVAL N/A 10/07/2016   Procedure: ICD LEAD REMOVAL ;  Surgeon: Evans Lance, MD;  Location: [facility] [facility];  Service: Cardiovascular;   Laterality: N/A;   [place]; performed at [facility]   TEE WITHOUT CARDIOVERSION N/A 10/07/2016   Procedure: TRANSESOPHAGEAL ECHOCARDIOGRAM (TEE);  Surgeon: Evans Lance, MD;  Location: [facility] OR;  Service: Cardiovascular;  Laterality: N/A;    There were no vitals filed for this visit.      [facility] PT Assessment - 01/18/22 0001       Assessment   Medical Diagnosis Diabetic Foot Ulcer    Referring Provider (PT) Redmond School MD                     Wound Therapy - 01/18/22 0001     Subjective Foot hurts sometimes worse than ohters    Patient and Family Stated Goals wound to heal    Date of Onset 01/14/20    Prior Treatments podiatrist    Evaluation and Treatment Procedures Explained to Patient/Family Yes    Evaluation and Treatment Procedures agreed to    Wound Properties Date First Assessed: 01/13/22 Time First Assessed: 4268 Wound Type: Diabetic ulcer Location: Heel Location Orientation: Left Wound Description (Comments): L plantar foot wound Present on Admission: Yes   Dressing Type None    Dressing Changed Changed    Dressing Status Old drainage    Dressing Change Frequency PRN    Site / Wound Assessment Yellow;Red;Pink;Pale    % Wound base Red or Granulating 95%    % Wound base Yellow/Fibrinous Exudate 5%    Peri-wound Assessment Other (Comment)   callus   Drainage Amount Scant    Drainage Description Serous    Treatment Cleansed;Debridement (Selective)    Selective Debridement - Location L foot    Selective Debridement - Tools Used Forceps;Scalpel;Scissors    Selective Debridement - Tissue Removed callous, slough    Wound Therapy - Clinical Statement Wound bed covered in slough at beginning of session. Able to debride most of slough from wound bed and continued debridement of callus from peri wound. Contined with medihoney to wound bed.    Wound Therapy - Functional Problem List ambulation, balance     Factors Delaying/Impairing Wound Healing Altered sensation;Diabetes Mellitus;Immobility;Multiple medical problems;Vascular compromise    Hydrotherapy Plan Debridement;Dressing change;Electrical stimulation;Patient/family education;Pulsatile lavage with suction;Ultrasonic wound therapy '@35'$  KHz (+/- 3 KHz)    Wound Therapy - Frequency 2X / week    Wound Therapy - Current Recommendations PT    Wound Plan debride and dressing changes PRN    Dressing  vaseline to perimeter, medihoney, 2x2, medipore                       PT Short Term Goals - 01/15/22 1444       PT SHORT TERM GOAL #1   Title Periwound will be free from callus.    Time 3    Period Weeks    Status On-going    Target Date 02/03/22               PT Long Term Goals - 01/15/22 1444       PT LONG TERM GOAL #1   Title Wound will be healed to reduce risk of infection.    Time 6    Period Weeks    Status On-going    Target Date 02/24/22                   Plan - 01/18/22 1133     Clinical Impression Statement see above    Personal Factors and Comorbidities Comorbidity 3+;Fitness;Time since onset of injury/illness/exacerbation;Past/Current Experience    Comorbidities DM, CKD, CAD, hx MI, hx TIA    Examination-Activity Limitations Locomotion Level;Transfers;Squat;Hygiene/Grooming    Examination-Participation Restrictions Cleaning;Community Activity;Shop;Volunteer;Laundry;Yard Work    Merchant [employer] officer Evolving/Moderate complexity    Rehab Potential Fair    PT Frequency 2x / week    PT Duration 6 weeks    PT Treatment/Interventions ADLs/Self Care Home Management;Gait training;Stair training;Functional mobility training;Therapeutic activities;Therapeutic exercise;Balance training;Neuromuscular re-education;Patient/family education;Orthotic Fit/Training;Manual lymph drainage;Manual techniques;Taping;Scar mobilization;Compression bandaging;DME Instruction    Consulted and Agree with  Plan of Care Patient             Patient will benefit from skilled therapeutic intervention in order to improve the following deficits and impairments:  Abnormal gait, Difficulty walking, Decreased endurance, Decreased activity tolerance, Decreased skin integrity, Pain, Decreased balance, Improper body mechanics, Decreased mobility, Decreased strength  Visit Diagnosis: Other abnormalities of gait and mobility  Diabetic foot ulcer associated with diabetes mellitus due to underlying condition, unspecified laterality, unspecified part of foot, unspecified ulcer stage ([place])     Problem List Patient Active Problem List   Diagnosis Date Noted   Non-pressure chronic ulcer of other part of right foot limited to breakdown of skin ([place])    Contracture of right Achilles tendon    Mildly dilatd aortic root ([facility])    Contracture of left Achilles tendon    Metatarsal deformity, left    CAD (coronary artery disease)    Hallux hammertoe, left 12/11/2019   Diarrhea 06/27/2019   Diastasis recti 16/05/9603   Umbilical hernia without obstruction and without gangrene 08/22/2018   Vitamin D deficiency 06/23/2017   Essential hypertension, benign 06/15/2017   Mixed hyperlipidemia 06/15/2017   Class 1 obesity due to excess calories with serious comorbidity and body mass index (BMI) of 31.0 to 31.9 in adult 06/15/2017   Malfunction of implantable cardioverter-defibrillator (ICD) electrode 10/07/2016   Chronic kidney disease (CKD), stage III (moderate) ([place]) 04/04/2015   Bladder neck obstruction 03/21/2015   Difficult or painful urination 03/21/2015   Flank pain 03/21/2015   Delayed onset of urination 03/21/2015   Calculus of kidney 03/21/2015   Erosive esophagitis 12/27/2013   Hypotension due to drugs 54/04/8118   Chronic systolic heart failure ([place]) 04/24/2013   CAP (community acquired pneumonia) 03/16/2013   HTN (hypertension) 03/15/2013   Hydrocele 03/15/2013   Spermatocele 03/15/2013   DOE  (dyspnea on exertion) 03/15/2013   OSA (obstructive sleep  apnea) 01/09/2013   Chronic kidney disease, stage 3 ([place]) 01/03/2013   Chest pain 12/26/2012   History of diagnostic tests 09/06/2012   TIA (transient ischemic attack)    Cardiomyopathy, nonischemic ([place]) 02/24/2010   AICD (automatic cardioverter/defibrillator) 02/24/2010   DM type 2 causing vascular disease ([place]) 06/27/2009   Gout 06/27/2009   Tobacco abuse, in remission 06/27/2009   COLONIC POLYPS 10/31/2008   GERD (gastroesophageal reflux disease) 10/31/2008    Vianne Bulls Emireth Cockerham, PT 01/18/2022, 12:09 PM  [facility] [address] [place], [place], [address] Phone: [phone]   Fax:  [phone]  Name: JAMIAH RECORE MRN: [id number] Date of Birth: [birth year]

## 2022-01-20 ENCOUNTER — Ambulatory Visit (HOSPITAL_COMMUNITY): Payer: Medicare Other | Admitting: Physical Therapy

## 2022-01-22 ENCOUNTER — Ambulatory Visit (HOSPITAL_COMMUNITY): Payer: Medicare Other | Admitting: Physical Therapy

## 2022-01-25 ENCOUNTER — Ambulatory Visit (HOSPITAL_COMMUNITY): Payer: Medicare Other | Admitting: Physical Therapy

## 2022-01-27 ENCOUNTER — Ambulatory Visit (HOSPITAL_COMMUNITY): Payer: Medicare Other

## 2022-02-11 DIAGNOSIS — U071 COVID-19: Secondary | ICD-10-CM | POA: Diagnosis not present

## 2022-02-11 DIAGNOSIS — N183 Chronic kidney disease, stage 3 unspecified: Secondary | ICD-10-CM | POA: Diagnosis not present

## 2022-02-11 DIAGNOSIS — I77810 Thoracic aortic ectasia: Secondary | ICD-10-CM | POA: Diagnosis not present

## 2022-02-11 DIAGNOSIS — M5136 Other intervertebral disc degeneration, lumbar region: Secondary | ICD-10-CM | POA: Diagnosis not present

## 2022-02-11 DIAGNOSIS — J208 Acute bronchitis due to other specified organisms: Secondary | ICD-10-CM | POA: Diagnosis not present

## 2022-02-11 DIAGNOSIS — G8929 Other chronic pain: Secondary | ICD-10-CM | POA: Diagnosis not present

## 2022-02-11 DIAGNOSIS — E1140 Type 2 diabetes mellitus with diabetic neuropathy, unspecified: Secondary | ICD-10-CM | POA: Diagnosis not present

## 2022-02-11 DIAGNOSIS — M1991 Primary osteoarthritis, unspecified site: Secondary | ICD-10-CM | POA: Diagnosis not present

## 2022-03-03 ENCOUNTER — Ambulatory Visit: Payer: Medicare Other | Admitting: Cardiovascular Disease

## 2022-03-04 ENCOUNTER — Telehealth: Payer: Self-pay | Admitting: Podiatry

## 2022-03-04 ENCOUNTER — Encounter: Payer: Self-pay | Admitting: Cardiovascular Disease

## 2022-03-04 NOTE — Telephone Encounter (Signed)
Patient wife called - diabetic ulcer in foot is infected,  it bleeds off and on oozing infection , they have been trying to treat it and its not getting better. He is having severe pain , would like something for pain .

## 2022-03-05 MED ORDER — ACETAMINOPHEN-CODEINE 300-30 MG PO TABS: 1.0000 | ORAL_TABLET | ORAL | 0 refills | Status: DC | PRN

## 2022-03-08 DIAGNOSIS — I10 Essential (primary) hypertension: Secondary | ICD-10-CM | POA: Diagnosis not present

## 2022-03-08 DIAGNOSIS — E119 Type 2 diabetes mellitus without complications: Secondary | ICD-10-CM | POA: Diagnosis not present

## 2022-03-12 ENCOUNTER — Ambulatory Visit: Payer: Medicare Other | Admitting: Podiatry

## 2022-03-12 DIAGNOSIS — L97521 Non-pressure chronic ulcer of other part of left foot limited to breakdown of skin: Secondary | ICD-10-CM | POA: Diagnosis not present

## 2022-03-12 DIAGNOSIS — E1165 Type 2 diabetes mellitus with hyperglycemia: Secondary | ICD-10-CM

## 2022-03-12 DIAGNOSIS — Z794 Long term (current) use of insulin: Secondary | ICD-10-CM | POA: Diagnosis not present

## 2022-03-12 MED ORDER — ACETAMINOPHEN-CODEINE 300-30 MG PO TABS: 1.0000 | ORAL_TABLET | ORAL | 0 refills | Status: DC | PRN

## 2022-03-16 ENCOUNTER — Telehealth (HOSPITAL_COMMUNITY): Payer: Self-pay | Admitting: Physical Therapy

## 2022-03-16 ENCOUNTER — Ambulatory Visit (HOSPITAL_COMMUNITY): Payer: Medicare Other | Admitting: Physical Therapy

## 2022-03-16 NOTE — Telephone Encounter (Signed)
Pt did not show for scheduled appointment today.  Called and left voicemail regarding missed appointment and to return call to clinic as we have not seen him in 2 months.  Teena Irani, PTA/CLT [place] Ph: [phone]

## 2022-03-18 ENCOUNTER — Other Ambulatory Visit: Payer: Self-pay | Admitting: Cardiovascular Disease

## 2022-03-18 DIAGNOSIS — L97509 Non-pressure chronic ulcer of other part of unspecified foot with unspecified severity: Secondary | ICD-10-CM | POA: Diagnosis not present

## 2022-03-18 DIAGNOSIS — M5136 Other intervertebral disc degeneration, lumbar region: Secondary | ICD-10-CM | POA: Diagnosis not present

## 2022-03-18 DIAGNOSIS — I77810 Thoracic aortic ectasia: Secondary | ICD-10-CM | POA: Diagnosis not present

## 2022-03-18 DIAGNOSIS — E1140 Type 2 diabetes mellitus with diabetic neuropathy, unspecified: Secondary | ICD-10-CM | POA: Diagnosis not present

## 2022-03-18 DIAGNOSIS — M1991 Primary osteoarthritis, unspecified site: Secondary | ICD-10-CM | POA: Diagnosis not present

## 2022-03-18 DIAGNOSIS — G894 Chronic pain syndrome: Secondary | ICD-10-CM | POA: Diagnosis not present

## 2022-03-18 DIAGNOSIS — N183 Chronic kidney disease, stage 3 unspecified: Secondary | ICD-10-CM | POA: Diagnosis not present

## 2022-03-19 NOTE — Progress Notes (Signed)
Subjective:  Patient ID: Mike Floyd, male    DOB: 1960/11/27,  MRN: [id number]  Chief Complaint  Patient presents with   Wound Check    61 y.o. male presents for wound care.  Patient presents with complaint left submetatarsal fourth ulceration.  Patient states that the wound is about the same.  He states he was seen at the wound care center.  He has had a regular appointment made as scheduled.  He denies any other acute complaints   Review of Systems: Negative except as noted in the HPI. Denies N/V/F/Ch.  Past Medical History:  Diagnosis Date   AICD (automatic cardioverter/defibrillator) present 11/2007   a. 11/2007 [facility] Current VR - single lead ICD  - Removed 2018 - "it was burning me"   Anxiety    CAD (coronary artery disease)    non-obstructive CAD by Cor CT in 2020 // Myoview 3/22: EF 57, small inf-sept defect c/w scar, no ischemia; low risk      Chest pain    a. 10/2007 Cath:  normal Cors.   CKD (chronic kidney disease), stage II    DDD (degenerative disc disease), lumbar    Diabetes mellitus DX: 2010   Erosive esophagitis    a. per EGD (08/2011), Dr. Laural Golden - Erosive reflux esophagitis improved but not completely healed since previous EGD 3 years ago. Bx showing  ulcerated gatroesophageal junction mucosa. negative for H. pylori   GERD (gastroesophageal reflux disease)    Gout    Hearing deficit    a. wear bilateral hearing aides   History of hiatal hernia    Hypertension    Mildly dilatd aortic root ([place])    CMR 4/22: EF 52, no LGE; d/w Dr. Marlowe Kays root 38 mm (mildly dilated)   Myocardial infarction [facility]) 2011   Neuropathy    Feet and legs   Nonischemic dilated cardiomyopathy ([place])    a. H/O EF as low as 35-40% by LV gram 10/2007;  b. Echo 02/2011 EF 50-55%, inf HK, Gr 1 DD // CMR 4/22: EF 52, no LGE; d/w Dr. Marlowe Kays root 38 mm (mildly dilated)    Renal insufficiency    Sleep apnea    pt doesnt use, states"I cant afford one". PCP aware   Stroke ([place])     mini-stroke in 2014   TIA (transient ischemic attack)    July, 2013   Tobacco abuse, in remission 06/27/2009   Discontinued in 2009     Wears dentures    top plate   WPW (Wolff-Parkinson-White syndrome)    a. s/p RFCA @ [facility] - 1999    Current Outpatient Medications:    acetaminophen-codeine (TYLENOL #3) 300-30 MG tablet, Take 1-2 tablets by mouth every 4 (four) hours as needed for moderate pain., Disp: 30 tablet, Rfl: 0   potassium chloride SA (KLOR-CON M) 20 MEQ tablet, TAKE (1) TABLET BY MOUTH ONCE DAILY., Disp: 90 tablet, Rfl: 3   acarbose (PRECOSE) 100 MG tablet, Take 100 mg by mouth 3 (three) times daily., Disp: , Rfl:    acetaminophen-codeine (TYLENOL #3) 300-30 MG tablet, Take 1-2 tablets by mouth every 4 (four) hours as needed for moderate pain., Disp: 30 tablet, Rfl: 0   amitriptyline (ELAVIL) 50 MG tablet, Take 50 mg by mouth at bedtime., Disp: , Rfl:    amoxicillin-clavulanate (AUGMENTIN) 875-125 MG tablet, Take 1 tablet by mouth 2 (two) times daily., Disp: 20 tablet, Rfl: 0   aspirin 81 MG tablet, Take 81 mg by mouth daily., Disp: ,  Rfl:    atorvastatin (LIPITOR) 20 MG tablet, Take 20 mg by mouth daily., Disp: , Rfl:    B-D ULTRAFINE III SHORT PEN 31G X 8 MM MISC, USE WITH TRESBIA., Disp: 100 each, Rfl: 0   calcium carbonate (OSCAL) 1500 (600 Ca) MG TABS tablet, Take 600 mg of elemental calcium by mouth daily with breakfast., Disp: , Rfl:    carvedilol (COREG) 12.5 MG tablet, TAKE 1 TABLET BY MOUTH TWICE DAILY., Disp: 30 tablet, Rfl: 0   clotrimazole-betamethasone (LOTRISONE) cream, Apply topically 2 (two) times daily., Disp: , Rfl:    famotidine (PEPCID) 20 MG tablet, Take 20 mg by mouth daily., Disp: , Rfl:    FARXIGA 10 MG TABS tablet, Take 10 mg by mouth daily., Disp: , Rfl:    furosemide (LASIX) 40 MG tablet, TAKE (1) TABLET BY MOUTH ONCE DAILY. (Patient taking differently: Take 40 mg by mouth daily.), Disp: 30 tablet, Rfl: 6   gabapentin (NEURONTIN) 600 MG tablet, Take  300 mg by mouth 3 (three) times daily., Disp: , Rfl:    glimepiride (AMARYL) 4 MG tablet, Take 4 mg by mouth 2 (two) times daily. , Disp: , Rfl:    insulin degludec (TRESIBA FLEXTOUCH) 100 UNIT/ML SOPN FlexTouch Pen, Inject 0.3 mLs (30 Units total) into the skin at bedtime. (Patient taking differently: Inject 52 Units into the skin daily. 50 - 80  based on CBG   AM), Disp: 15 mL, Rfl: 2   isosorbide mononitrate (IMDUR) 30 MG 24 hr tablet, TAKE (1) TABLET BY MOUTH ONCE DAILY. (Patient taking differently: Take 30 mg by mouth daily.), Disp: 60 tablet, Rfl: 0   LORazepam (ATIVAN) 1 MG tablet, Take 1 mg by mouth at bedtime., Disp: , Rfl:    mupirocin ointment (BACTROBAN) 2 %, Apply 1 application topically 2 (two) times daily., Disp: 22 g, Rfl: 0   nitroGLYCERIN (NITROSTAT) 0.4 MG SL tablet, Place 0.4 mg under the tongue every 5 (five) minutes as needed for chest pain., Disp: , Rfl:    omeprazole (PRILOSEC) 40 MG capsule, TAKE (1) CAPSULE BY MOUTH ONCE DAILY., Disp: 90 capsule, Rfl: 0   ONETOUCH VERIO test strip, TESTING 4 TIMES DAILY., Disp: 150 each, Rfl: 5   oxycodone (ROXICODONE) 30 MG immediate release tablet, Take 30-60 mg by mouth 4 (four) times daily as needed for pain., Disp: , Rfl:    pantoprazole (PROTONIX) 40 MG tablet, Take 40 mg by mouth 2 (two) times daily., Disp: , Rfl:    SSD 1 % cream, APPLY TO AFFECTED AREA DAILY AS DIRECTED., Disp: 50 g, Rfl: 0   sucralfate (CARAFATE) 1 g tablet, TAKE 1 TABLET BY MOUTH 30 MINTUES BEFORE MEALS AND AT BEDTIME. (Patient taking differently: Take by mouth 4 (four) times daily -  with meals and at bedtime. TAKE 1 TABLET BY MOUTH 30 MINTUES BEFORE MEALS AND AT BEDTIME.), Disp: 120 tablet, Rfl: 0   tamsulosin (FLOMAX) 0.4 MG CAPS capsule, Take 0.4 mg by mouth daily. am, Disp: , Rfl:    vitamin B-12 (CYANOCOBALAMIN) 1000 MCG tablet, Take 1,000 mcg by mouth daily., Disp: , Rfl:   Social History   Tobacco Use  Smoking Status Former   Packs/day: 1.50   Years:  35.00   Total pack years: 52.50   Types: Cigarettes   Start date: 06/30/1971   Quit date: 07/30/2010   Years since quitting: 11.6  Smokeless Tobacco Former   Types: Chew   Quit date: 03/26/1994    No Known Allergies Objective:  There were no vitals filed for this visit. There is no height or weight on file to calculate BMI. Constitutional Well developed. Well nourished.  Vascular Dorsalis pedis pulses palpable bilaterally. Posterior tibial pulses palpable bilaterally. Capillary refill normal to all digits.  No cyanosis or clubbing noted. Pedal hair growth normal.  Neurologic Normal speech. Oriented to person, place, and time. Protective sensation absent  Dermatologic Wound Location: Left submetatarsal 4 ulceration with fat layer exposed.  No malodor present no probing down to bone noted.  No redness noted.  Plantarflexed metatarsal noted. Wound Base: Mixed Granular/Fibrotic Peri-wound: Calloused Exudate: Scant/small amount Serosanguinous exudate Wound Measurements: -See below  Orthopedic: No pain to palpation either foot.   Radiographs: 3 views of skeletally mature adult prior x-rays were reviewed.  No signs of osteomyelitis noted.  No bony abnormalities noted.  Previous hardware noted. Assessment:   1. Ulcer of left foot, limited to breakdown of skin ([place])   2. Controlled type 2 diabetes mellitus with hyperglycemia, with long-term current use of insulin ([place])       Plan:  Patient was evaluated and treated and all questions answered.  Ulcer left submetatarsal 4 ulceration with fat layer exposed with underlying plantarflexed fourth metatarsal  -The wound is being actively managed at the wound care center.  At this time I discussed with the patient and I can help if something gets worse however I will follow him from afar and he will continue getting further management at the wound care center.  Patient agrees with the plan.  No follow-ups on file.

## 2022-04-08 DIAGNOSIS — E119 Type 2 diabetes mellitus without complications: Secondary | ICD-10-CM | POA: Diagnosis not present

## 2022-04-08 DIAGNOSIS — I10 Essential (primary) hypertension: Secondary | ICD-10-CM | POA: Diagnosis not present

## 2022-04-16 DIAGNOSIS — E1140 Type 2 diabetes mellitus with diabetic neuropathy, unspecified: Secondary | ICD-10-CM | POA: Diagnosis not present

## 2022-04-16 DIAGNOSIS — G894 Chronic pain syndrome: Secondary | ICD-10-CM | POA: Diagnosis not present

## 2022-04-16 DIAGNOSIS — M5136 Other intervertebral disc degeneration, lumbar region: Secondary | ICD-10-CM | POA: Diagnosis not present

## 2022-04-16 DIAGNOSIS — L97509 Non-pressure chronic ulcer of other part of unspecified foot with unspecified severity: Secondary | ICD-10-CM | POA: Diagnosis not present

## 2022-04-16 DIAGNOSIS — M1991 Primary osteoarthritis, unspecified site: Secondary | ICD-10-CM | POA: Diagnosis not present

## 2022-04-22 ENCOUNTER — Telehealth: Payer: Self-pay | Admitting: Podiatry

## 2022-04-22 NOTE — Telephone Encounter (Signed)
Pts wife called and was wanting to cxl the appt for tomorrow with Dr Posey Pronto for ulcer check(pt is diabetic) due to they did not have copay.  Verified with billing dept ok to not pay copay tomorrow but they will get a bill for the copay in the mail. PTs wife was very thankful and said god bless [place].

## 2022-04-23 ENCOUNTER — Ambulatory Visit (INDEPENDENT_AMBULATORY_CARE_PROVIDER_SITE_OTHER): Payer: Medicare Other | Admitting: Podiatry

## 2022-04-23 DIAGNOSIS — L97521 Non-pressure chronic ulcer of other part of left foot limited to breakdown of skin: Secondary | ICD-10-CM

## 2022-04-23 DIAGNOSIS — Z794 Long term (current) use of insulin: Secondary | ICD-10-CM

## 2022-04-23 DIAGNOSIS — E1165 Type 2 diabetes mellitus with hyperglycemia: Secondary | ICD-10-CM

## 2022-04-23 NOTE — Progress Notes (Signed)
Subjective:  Patient ID: Mike Floyd, male    DOB: 03/11/1961,  MRN: [id number]  Chief Complaint  Patient presents with   Diabetic Ulcer    60 y.o. male presents for wound care.  Patient presents with complaint left submetatarsal fourth ulceration.  Patient states that the wound is about the same.  He states that the wound is about the same.  He was not able to go to the wound care center.  He denies any other acute complaints he would like for me to take care of.  Review of Systems: Negative except as noted in the HPI. Denies N/V/F/Ch.  Past Medical History:  Diagnosis Date   AICD (automatic cardioverter/defibrillator) present 11/2007   a. 11/2007 SJM Current VR - single lead ICD  - Removed 2018 - "it was burning me"   Anxiety    CAD (coronary artery disease)    non-obstructive CAD by Cor CT in 2020 // Myoview 3/22: EF 57, small inf-sept defect c/w scar, no ischemia; low risk      Chest pain    a. 10/2007 Cath:  normal Cors.   CKD (chronic kidney disease), stage II    DDD (degenerative disc disease), lumbar    Diabetes mellitus DX: 2010   Erosive esophagitis    a. per EGD (08/2011), Dr. Laural Golden - Erosive reflux esophagitis improved but not completely healed since previous EGD 3 years ago. Bx showing  ulcerated gatroesophageal junction mucosa. negative for H. pylori   GERD (gastroesophageal reflux disease)    Gout    Hearing deficit    a. wear bilateral hearing aides   History of hiatal hernia    Hypertension    Mildly dilatd aortic root ([place])    CMR 4/22: EF 52, no LGE; d/w Dr. Marlowe Kays root 38 mm (mildly dilated)   Myocardial infarction [facility]) 2011   Neuropathy    Feet and legs   Nonischemic dilated cardiomyopathy ([place])    a. H/O EF as low as 35-40% by LV gram 10/2007;  b. Echo 02/2011 EF 50-55%, inf HK, Gr 1 DD // CMR 4/22: EF 52, no LGE; d/w Dr. Marlowe Kays root 38 mm (mildly dilated)    Renal insufficiency    Sleep apnea    pt doesnt use, states"I cant afford one". PCP  aware   Stroke ([place])    mini-stroke in 2014   TIA (transient ischemic attack)    July, 2013   Tobacco abuse, in remission 06/27/2009   Discontinued in 2009     Wears dentures    top plate   WPW (Wolff-Parkinson-White syndrome)    a. s/p RFCA @ [facility] - 1999    Current Outpatient Medications:    potassium chloride SA (KLOR-CON M) 20 MEQ tablet, TAKE (1) TABLET BY MOUTH ONCE DAILY., Disp: 90 tablet, Rfl: 3   acarbose (PRECOSE) 100 MG tablet, Take 100 mg by mouth 3 (three) times daily., Disp: , Rfl:    acetaminophen-codeine (TYLENOL #3) 300-30 MG tablet, Take 1-2 tablets by mouth every 4 (four) hours as needed for moderate pain., Disp: 30 tablet, Rfl: 0   acetaminophen-codeine (TYLENOL #3) 300-30 MG tablet, Take 1-2 tablets by mouth every 4 (four) hours as needed for moderate pain., Disp: 30 tablet, Rfl: 0   amitriptyline (ELAVIL) 50 MG tablet, Take 50 mg by mouth at bedtime., Disp: , Rfl:    amoxicillin-clavulanate (AUGMENTIN) 875-125 MG tablet, Take 1 tablet by mouth 2 (two) times daily., Disp: 20 tablet, Rfl: 0   aspirin 81  MG tablet, Take 81 mg by mouth daily., Disp: , Rfl:    atorvastatin (LIPITOR) 20 MG tablet, Take 20 mg by mouth daily., Disp: , Rfl:    B-D ULTRAFINE III SHORT PEN 31G X 8 MM MISC, USE WITH TRESBIA., Disp: 100 each, Rfl: 0   calcium carbonate (OSCAL) 1500 (600 Ca) MG TABS tablet, Take 600 mg of elemental calcium by mouth daily with breakfast., Disp: , Rfl:    carvedilol (COREG) 12.5 MG tablet, TAKE 1 TABLET BY MOUTH TWICE DAILY., Disp: 30 tablet, Rfl: 0   clotrimazole-betamethasone (LOTRISONE) cream, Apply topically 2 (two) times daily., Disp: , Rfl:    famotidine (PEPCID) 20 MG tablet, Take 20 mg by mouth daily., Disp: , Rfl:    FARXIGA 10 MG TABS tablet, Take 10 mg by mouth daily., Disp: , Rfl:    furosemide (LASIX) 40 MG tablet, TAKE (1) TABLET BY MOUTH ONCE DAILY. (Patient taking differently: Take 40 mg by mouth daily.), Disp: 30 tablet, Rfl: 6   gabapentin  (NEURONTIN) 600 MG tablet, Take 300 mg by mouth 3 (three) times daily., Disp: , Rfl:    glimepiride (AMARYL) 4 MG tablet, Take 4 mg by mouth 2 (two) times daily. , Disp: , Rfl:    insulin degludec (TRESIBA FLEXTOUCH) 100 UNIT/ML SOPN FlexTouch Pen, Inject 0.3 mLs (30 Units total) into the skin at bedtime. (Patient taking differently: Inject 52 Units into the skin daily. 50 - 80  based on CBG   AM), Disp: 15 mL, Rfl: 2   isosorbide mononitrate (IMDUR) 30 MG 24 hr tablet, TAKE (1) TABLET BY MOUTH ONCE DAILY. (Patient taking differently: Take 30 mg by mouth daily.), Disp: 60 tablet, Rfl: 0   LORazepam (ATIVAN) 1 MG tablet, Take 1 mg by mouth at bedtime., Disp: , Rfl:    mupirocin ointment (BACTROBAN) 2 %, Apply 1 application topically 2 (two) times daily., Disp: 22 g, Rfl: 0   nitroGLYCERIN (NITROSTAT) 0.4 MG SL tablet, Place 0.4 mg under the tongue every 5 (five) minutes as needed for chest pain., Disp: , Rfl:    omeprazole (PRILOSEC) 40 MG capsule, TAKE (1) CAPSULE BY MOUTH ONCE DAILY., Disp: 90 capsule, Rfl: 0   ONETOUCH VERIO test strip, TESTING 4 TIMES DAILY., Disp: 150 each, Rfl: 5   oxycodone (ROXICODONE) 30 MG immediate release tablet, Take 30-60 mg by mouth 4 (four) times daily as needed for pain., Disp: , Rfl:    pantoprazole (PROTONIX) 40 MG tablet, Take 40 mg by mouth 2 (two) times daily., Disp: , Rfl:    SSD 1 % cream, APPLY TO AFFECTED AREA DAILY AS DIRECTED., Disp: 50 g, Rfl: 0   sucralfate (CARAFATE) 1 g tablet, TAKE 1 TABLET BY MOUTH 30 MINTUES BEFORE MEALS AND AT BEDTIME. (Patient taking differently: Take by mouth 4 (four) times daily -  with meals and at bedtime. TAKE 1 TABLET BY MOUTH 30 MINTUES BEFORE MEALS AND AT BEDTIME.), Disp: 120 tablet, Rfl: 0   tamsulosin (FLOMAX) 0.4 MG CAPS capsule, Take 0.4 mg by mouth daily. am, Disp: , Rfl:    vitamin B-12 (CYANOCOBALAMIN) 1000 MCG tablet, Take 1,000 mcg by mouth daily., Disp: , Rfl:   Social History   Tobacco Use  Smoking Status  Former   Packs/day: 1.50   Years: 35.00   Total pack years: 52.50   Types: Cigarettes   Start date: 06/30/1971   Quit date: 07/30/2010   Years since quitting: 11.7  Smokeless Tobacco Former   Types: Loss adjuster, chartered  Quit date: 03/26/1994    No Known Allergies Objective:  There were no vitals filed for this visit. There is no height or weight on file to calculate BMI. Constitutional Well developed. Well nourished.  Vascular Dorsalis pedis pulses palpable bilaterally. Posterior tibial pulses palpable bilaterally. Capillary refill normal to all digits.  No cyanosis or clubbing noted. Pedal hair growth normal.  Neurologic Normal speech. Oriented to person, place, and time. Protective sensation absent  Dermatologic Wound Location: Left submetatarsal 4 ulceration with fat layer exposed.  No malodor present no probing down to bone noted.  No redness noted.  Plantarflexed metatarsal noted. Wound Base: Mixed Granular/Fibrotic Peri-wound: Calloused Exudate: Scant/small amount Serosanguinous exudate Wound Measurements: -See below  Orthopedic: No pain to palpation either foot.   Radiographs: 3 views of skeletally mature adult prior x-rays were reviewed.  No signs of osteomyelitis noted.  No bony abnormalities noted.  Previous hardware noted. Assessment:   1. Ulcer of left foot, limited to breakdown of skin ([place])   2. Controlled type 2 diabetes mellitus with hyperglycemia, with long-term current use of insulin ([place])       Plan:  Patient was evaluated and treated and all questions answered.  Ulcer left submetatarsal 4 ulceration with fat layer exposed with underlying plantarflexed fourth metatarsal  -Debridement as below. -Dressed with Betadine wet-to-dry, DSD. -Continue off-loading with surgical shoe. -Recent arterial duplex studies show Directed duplex of the bilateral lower extremity demonstrates waveforms maintained throughout. -Diabetic glucose control per endocrinology -I will  discuss floating osteotomy during next clinical visit his last A1c was 8.6% in May.  Once his A1c is below 8% he can discuss doing a floating osteotomy for this.  Procedure: Excisional Debridement of Wound~stagnant Tool: Sharp chisel blade/tissue nipper Rationale: Removal of non-viable soft tissue from the wound to promote healing.  Anesthesia: none Pre-Debridement Wound Measurements: 1 cm x 0.7 cm x 0.3 cm  Post-Debridement Wound Measurements: 1.2 cm x 0.8 cm x 0.3 cm  Type of Debridement: Sharp Excisional Tissue Removed: Non-viable soft tissue Blood loss: Minimal (<50cc) Depth of Debridement: subcutaneous tissue. Technique: Sharp excisional debridement to bleeding, viable wound base.  Wound Progress: The wound continues to be stagnant without decreasing in size. Site healing conversation 7 Dressing: Dry, sterile, compression dressing. Disposition: Patient tolerated procedure well. Patient to return in 1 week for follow-up.  No follow-ups on file.

## 2022-05-13 ENCOUNTER — Other Ambulatory Visit: Payer: Self-pay

## 2022-05-13 ENCOUNTER — Inpatient Hospital Stay (HOSPITAL_COMMUNITY): Payer: Medicare Other

## 2022-05-13 ENCOUNTER — Encounter (HOSPITAL_COMMUNITY): Payer: Self-pay | Admitting: *Deleted

## 2022-05-13 ENCOUNTER — Inpatient Hospital Stay (HOSPITAL_COMMUNITY)
Admission: EM | Admit: 2022-05-13 | Discharge: 2022-05-21 | DRG: 617 | Disposition: A | Discharge status: Home Health | Payer: Medicare Other | Attending: Internal Medicine | Admitting: Internal Medicine

## 2022-05-13 ENCOUNTER — Emergency Department (HOSPITAL_COMMUNITY): Payer: Medicare Other

## 2022-05-13 DIAGNOSIS — Z8249 Family history of ischemic heart disease and other diseases of the circulatory system: Secondary | ICD-10-CM

## 2022-05-13 DIAGNOSIS — S91302A Unspecified open wound, left foot, initial encounter: Secondary | ICD-10-CM | POA: Diagnosis not present

## 2022-05-13 DIAGNOSIS — Z8673 Personal history of transient ischemic attack (TIA), and cerebral infarction without residual deficits: Secondary | ICD-10-CM

## 2022-05-13 DIAGNOSIS — M5136 Other intervertebral disc degeneration, lumbar region: Secondary | ICD-10-CM | POA: Diagnosis not present

## 2022-05-13 DIAGNOSIS — E1122 Type 2 diabetes mellitus with diabetic chronic kidney disease: Secondary | ICD-10-CM | POA: Diagnosis not present

## 2022-05-13 DIAGNOSIS — E876 Hypokalemia: Secondary | ICD-10-CM | POA: Diagnosis present

## 2022-05-13 DIAGNOSIS — E1165 Type 2 diabetes mellitus with hyperglycemia: Secondary | ICD-10-CM | POA: Diagnosis present

## 2022-05-13 DIAGNOSIS — E1159 Type 2 diabetes mellitus with other circulatory complications: Secondary | ICD-10-CM | POA: Diagnosis present

## 2022-05-13 DIAGNOSIS — A419 Sepsis, unspecified organism: Secondary | ICD-10-CM | POA: Diagnosis not present

## 2022-05-13 DIAGNOSIS — I509 Heart failure, unspecified: Secondary | ICD-10-CM | POA: Diagnosis not present

## 2022-05-13 DIAGNOSIS — E871 Hypo-osmolality and hyponatremia: Secondary | ICD-10-CM | POA: Diagnosis not present

## 2022-05-13 DIAGNOSIS — I2510 Atherosclerotic heart disease of native coronary artery without angina pectoris: Secondary | ICD-10-CM | POA: Diagnosis not present

## 2022-05-13 DIAGNOSIS — E11621 Type 2 diabetes mellitus with foot ulcer: Secondary | ICD-10-CM | POA: Diagnosis not present

## 2022-05-13 DIAGNOSIS — I420 Dilated cardiomyopathy: Secondary | ICD-10-CM | POA: Diagnosis present

## 2022-05-13 DIAGNOSIS — L97529 Non-pressure chronic ulcer of other part of left foot with unspecified severity: Secondary | ICD-10-CM | POA: Diagnosis present

## 2022-05-13 DIAGNOSIS — I129 Hypertensive chronic kidney disease with stage 1 through stage 4 chronic kidney disease, or unspecified chronic kidney disease: Secondary | ICD-10-CM | POA: Diagnosis not present

## 2022-05-13 DIAGNOSIS — N400 Enlarged prostate without lower urinary tract symptoms: Secondary | ICD-10-CM | POA: Diagnosis present

## 2022-05-13 DIAGNOSIS — M86172 Other acute osteomyelitis, left ankle and foot: Secondary | ICD-10-CM | POA: Diagnosis not present

## 2022-05-13 DIAGNOSIS — Z79899 Other long term (current) drug therapy: Secondary | ICD-10-CM

## 2022-05-13 DIAGNOSIS — Z7982 Long term (current) use of aspirin: Secondary | ICD-10-CM

## 2022-05-13 DIAGNOSIS — L89620 Pressure ulcer of left heel, unstageable: Secondary | ICD-10-CM | POA: Diagnosis not present

## 2022-05-13 DIAGNOSIS — F419 Anxiety disorder, unspecified: Secondary | ICD-10-CM | POA: Diagnosis present

## 2022-05-13 DIAGNOSIS — L97509 Non-pressure chronic ulcer of other part of unspecified foot with unspecified severity: Secondary | ICD-10-CM | POA: Diagnosis not present

## 2022-05-13 DIAGNOSIS — Z95810 Presence of automatic (implantable) cardiac defibrillator: Secondary | ICD-10-CM | POA: Diagnosis not present

## 2022-05-13 DIAGNOSIS — I10 Essential (primary) hypertension: Secondary | ICD-10-CM | POA: Diagnosis not present

## 2022-05-13 DIAGNOSIS — Z87891 Personal history of nicotine dependence: Secondary | ICD-10-CM

## 2022-05-13 DIAGNOSIS — E1169 Type 2 diabetes mellitus with other specified complication: Principal | ICD-10-CM | POA: Diagnosis present

## 2022-05-13 DIAGNOSIS — I252 Old myocardial infarction: Secondary | ICD-10-CM

## 2022-05-13 DIAGNOSIS — E782 Mixed hyperlipidemia: Secondary | ICD-10-CM | POA: Diagnosis present

## 2022-05-13 DIAGNOSIS — K219 Gastro-esophageal reflux disease without esophagitis: Secondary | ICD-10-CM | POA: Diagnosis present

## 2022-05-13 DIAGNOSIS — Z833 Family history of diabetes mellitus: Secondary | ICD-10-CM

## 2022-05-13 DIAGNOSIS — Z794 Long term (current) use of insulin: Secondary | ICD-10-CM | POA: Diagnosis not present

## 2022-05-13 DIAGNOSIS — I739 Peripheral vascular disease, unspecified: Secondary | ICD-10-CM | POA: Diagnosis not present

## 2022-05-13 DIAGNOSIS — N183 Chronic kidney disease, stage 3 unspecified: Secondary | ICD-10-CM | POA: Diagnosis not present

## 2022-05-13 DIAGNOSIS — M19072 Primary osteoarthritis, left ankle and foot: Secondary | ICD-10-CM | POA: Diagnosis not present

## 2022-05-13 DIAGNOSIS — I2583 Coronary atherosclerosis due to lipid rich plaque: Secondary | ICD-10-CM | POA: Diagnosis not present

## 2022-05-13 DIAGNOSIS — M7989 Other specified soft tissue disorders: Secondary | ICD-10-CM | POA: Diagnosis not present

## 2022-05-13 DIAGNOSIS — M86272 Subacute osteomyelitis, left ankle and foot: Secondary | ICD-10-CM | POA: Diagnosis not present

## 2022-05-13 DIAGNOSIS — M109 Gout, unspecified: Secondary | ICD-10-CM | POA: Diagnosis present

## 2022-05-13 DIAGNOSIS — M869 Osteomyelitis, unspecified: Principal | ICD-10-CM

## 2022-05-13 DIAGNOSIS — Z841 Family history of disorders of kidney and ureter: Secondary | ICD-10-CM

## 2022-05-13 DIAGNOSIS — E08621 Diabetes mellitus due to underlying condition with foot ulcer: Secondary | ICD-10-CM | POA: Diagnosis not present

## 2022-05-13 DIAGNOSIS — E1151 Type 2 diabetes mellitus with diabetic peripheral angiopathy without gangrene: Secondary | ICD-10-CM | POA: Diagnosis not present

## 2022-05-13 DIAGNOSIS — R9431 Abnormal electrocardiogram [ECG] [EKG]: Secondary | ICD-10-CM | POA: Diagnosis not present

## 2022-05-13 DIAGNOSIS — M1991 Primary osteoarthritis, unspecified site: Secondary | ICD-10-CM | POA: Diagnosis not present

## 2022-05-13 DIAGNOSIS — E1129 Type 2 diabetes mellitus with other diabetic kidney complication: Secondary | ICD-10-CM | POA: Diagnosis not present

## 2022-05-13 DIAGNOSIS — M25475 Effusion, left foot: Secondary | ICD-10-CM | POA: Diagnosis not present

## 2022-05-13 DIAGNOSIS — G894 Chronic pain syndrome: Secondary | ICD-10-CM | POA: Diagnosis not present

## 2022-05-13 DIAGNOSIS — E11628 Type 2 diabetes mellitus with other skin complications: Secondary | ICD-10-CM | POA: Diagnosis not present

## 2022-05-13 DIAGNOSIS — L03116 Cellulitis of left lower limb: Secondary | ICD-10-CM | POA: Diagnosis not present

## 2022-05-13 DIAGNOSIS — Z823 Family history of stroke: Secondary | ICD-10-CM

## 2022-05-13 DIAGNOSIS — L84 Corns and callosities: Secondary | ICD-10-CM | POA: Diagnosis present

## 2022-05-13 DIAGNOSIS — E1140 Type 2 diabetes mellitus with diabetic neuropathy, unspecified: Secondary | ICD-10-CM | POA: Diagnosis present

## 2022-05-13 LAB — CBC
HCT: 34.9 % — ABNORMAL LOW (ref 39.0–52.0)
Hemoglobin: 12.2 g/dL — ABNORMAL LOW (ref 13.0–17.0)
MCH: 29.2 pg (ref 26.0–34.0)
MCHC: 35 g/dL (ref 30.0–36.0)
MCV: 83.5 fL (ref 80.0–100.0)
Platelets: 172 10*3/uL (ref 150–400)
RBC: 4.18 MIL/uL — ABNORMAL LOW (ref 4.22–5.81)
RDW: 13.7 % (ref 11.5–15.5)
WBC: 5.5 10*3/uL (ref 4.0–10.5)
nRBC: 0 % (ref 0.0–0.2)

## 2022-05-13 LAB — HEMOGLOBIN A1C
Hgb A1c MFr Bld: 9.7 % — ABNORMAL HIGH (ref 4.8–5.6)
Mean Plasma Glucose: 231.69 mg/dL

## 2022-05-13 LAB — URINALYSIS, ROUTINE W REFLEX MICROSCOPIC
Bacteria, UA: NONE SEEN
Bilirubin Urine: NEGATIVE
Glucose, UA: >=500 mg/dL — AB
Hgb urine dipstick: NEGATIVE
Ketones, ur: NEGATIVE mg/dL
Leukocytes,Ua: NEGATIVE
Nitrite: NEGATIVE
Protein, ur: NEGATIVE mg/dL
Specific Gravity, Urine: 1.022 (ref 1.005–1.030)
pH: 6 (ref 5.0–8.0)

## 2022-05-13 LAB — COMPREHENSIVE METABOLIC PANEL
ALT: 16 U/L (ref 0–44)
AST: 14 U/L — ABNORMAL LOW (ref 15–41)
Albumin: 4.2 g/dL (ref 3.5–5.0)
Alkaline Phosphatase: 179 U/L — ABNORMAL HIGH (ref 38–126)
Anion gap: 11 (ref 5–15)
BUN: 13 mg/dL (ref 8–23)
CO2: 26 mmol/L (ref 22–32)
Calcium: 9 mg/dL (ref 8.9–10.3)
Chloride: 94 mmol/L — ABNORMAL LOW (ref 98–111)
Creatinine, Ser: 1.15 mg/dL (ref 0.61–1.24)
GFR, Estimated: >60 mL/min (ref >60)
Glucose, Bld: 469 mg/dL — ABNORMAL HIGH (ref 70–99)
Potassium: 4.6 mmol/L (ref 3.5–5.1)
Sodium: 131 mmol/L — ABNORMAL LOW (ref 135–145)
Total Bilirubin: 1.9 mg/dL — ABNORMAL HIGH (ref 0.3–1.2)
Total Protein: 7.7 g/dL (ref 6.5–8.1)

## 2022-05-13 LAB — CBC WITH DIFFERENTIAL/PLATELET
Abs Immature Granulocytes: 0.02 10*3/uL (ref 0.00–0.07)
Basophils Absolute: 0 10*3/uL (ref 0.0–0.1)
Basophils Relative: 1 %
Eosinophils Absolute: 0.1 10*3/uL (ref 0.0–0.5)
Eosinophils Relative: 2 %
HCT: 40.9 % (ref 39.0–52.0)
Hemoglobin: 14.3 g/dL (ref 13.0–17.0)
Immature Granulocytes: 0 %
Lymphocytes Relative: 14 %
Lymphs Abs: 0.9 10*3/uL (ref 0.7–4.0)
MCH: 28.9 pg (ref 26.0–34.0)
MCHC: 35 g/dL (ref 30.0–36.0)
MCV: 82.6 fL (ref 80.0–100.0)
Monocytes Absolute: 0.4 10*3/uL (ref 0.1–1.0)
Monocytes Relative: 7 %
Neutro Abs: 5 10*3/uL (ref 1.7–7.7)
Neutrophils Relative %: 76 %
Platelets: 221 10*3/uL (ref 150–400)
RBC: 4.95 MIL/uL (ref 4.22–5.81)
RDW: 13.7 % (ref 11.5–15.5)
WBC: 6.5 10*3/uL (ref 4.0–10.5)
nRBC: 0 % (ref 0.0–0.2)

## 2022-05-13 LAB — GLUCOSE, CAPILLARY
Glucose-Capillary: 338 mg/dL — ABNORMAL HIGH (ref 70–99)
Glucose-Capillary: 203 mg/dL — ABNORMAL HIGH (ref 70–99)

## 2022-05-13 LAB — LACTIC ACID, PLASMA
Lactic Acid, Venous: 1.5 mmol/L (ref 0.5–1.9)
Lactic Acid, Venous: 2.4 mmol/L (ref 0.5–1.9)

## 2022-05-13 LAB — PROTIME-INR
INR: 1.1 (ref 0.8–1.2)
Prothrombin Time: 14 seconds (ref 11.4–15.2)

## 2022-05-13 LAB — HIV ANTIBODY (ROUTINE TESTING W REFLEX): HIV Screen 4th Generation wRfx: NONREACTIVE

## 2022-05-13 LAB — CREATININE, SERUM
Creatinine, Ser: 1.08 mg/dL (ref 0.61–1.24)
GFR, Estimated: >60 mL/min (ref >60)

## 2022-05-13 LAB — APTT: aPTT: 32 seconds (ref 24–36)

## 2022-05-13 MED ORDER — ATORVASTATIN CALCIUM 10 MG PO TABS
20.0000 mg | ORAL_TABLET | Freq: Every day | ORAL | Status: DC
  Administered 2022-05-13 – 2022-05-20 (×8): 20 mg via ORAL
  Filled 2022-05-13 (×8): qty 2

## 2022-05-13 MED ORDER — LACTATED RINGERS IV SOLN: INTRAVENOUS | Status: AC

## 2022-05-13 MED ORDER — GABAPENTIN 300 MG PO CAPS
300.0000 mg | ORAL_CAPSULE | Freq: Three times a day (TID) | ORAL | Status: DC
  Administered 2022-05-13 – 2022-05-21 (×24): 300 mg via ORAL
  Filled 2022-05-13 (×24): qty 1

## 2022-05-13 MED ORDER — VANCOMYCIN HCL IN DEXTROSE 1-5 GM/200ML-% IV SOLN
1000.0000 mg | Freq: Two times a day (BID) | INTRAVENOUS | Status: DC
  Administered 2022-05-14 – 2022-05-20 (×13): 1000 mg via INTRAVENOUS
  Filled 2022-05-13 (×14): qty 200

## 2022-05-13 MED ORDER — INSULIN ASPART PROT & ASPART (70-30 MIX) 100 UNIT/ML ~~LOC~~ SUSP
50.0000 [IU] | Freq: Two times a day (BID) | SUBCUTANEOUS | Status: DC
  Administered 2022-05-13 – 2022-05-17 (×9): 50 [IU] via SUBCUTANEOUS
  Filled 2022-05-13 (×3): qty 10

## 2022-05-13 MED ORDER — MORPHINE SULFATE (PF) 4 MG/ML IV SOLN
4.0000 mg | Freq: Once | INTRAVENOUS | Status: AC
  Administered 2022-05-13: 4 mg via INTRAVENOUS
  Filled 2022-05-13: qty 1

## 2022-05-13 MED ORDER — OXYCODONE HCL 5 MG PO TABS
15.0000 mg | ORAL_TABLET | Freq: Four times a day (QID) | ORAL | Status: DC | PRN
  Administered 2022-05-14 – 2022-05-21 (×16): 15 mg via ORAL
  Filled 2022-05-13 (×16): qty 3

## 2022-05-13 MED ORDER — GADOPICLENOL 0.5 MMOL/ML IV SOLN
10.0000 mL | Freq: Once | INTRAVENOUS | Status: AC | PRN
  Administered 2022-05-13: 10 mL via INTRAVENOUS

## 2022-05-13 MED ORDER — PANTOPRAZOLE SODIUM 40 MG PO TBEC
40.0000 mg | DELAYED_RELEASE_TABLET | Freq: Two times a day (BID) | ORAL | Status: DC
  Administered 2022-05-13 – 2022-05-21 (×16): 40 mg via ORAL
  Filled 2022-05-13 (×16): qty 1

## 2022-05-13 MED ORDER — GABAPENTIN 600 MG PO TABS
300.0000 mg | ORAL_TABLET | Freq: Three times a day (TID) | ORAL | Status: DC
  Filled 2022-05-13 (×3): qty 0.5

## 2022-05-13 MED ORDER — CALCIUM CARBONATE 1250 (500 CA) MG PO TABS
500.0000 mg | ORAL_TABLET | Freq: Every day | ORAL | Status: DC
  Administered 2022-05-14 – 2022-05-21 (×8): 1250 mg via ORAL
  Filled 2022-05-13 (×9): qty 1

## 2022-05-13 MED ORDER — VANCOMYCIN HCL IN DEXTROSE 1-5 GM/200ML-% IV SOLN: 1000.0000 mg | Freq: Once | INTRAVENOUS | Status: DC

## 2022-05-13 MED ORDER — ENOXAPARIN SODIUM 40 MG/0.4ML IJ SOSY
40.0000 mg | PREFILLED_SYRINGE | INTRAMUSCULAR | Status: DC
  Administered 2022-05-13 – 2022-05-20 (×7): 40 mg via SUBCUTANEOUS
  Filled 2022-05-13 (×7): qty 0.4

## 2022-05-13 MED ORDER — ONDANSETRON HCL 4 MG PO TABS
4.0000 mg | ORAL_TABLET | Freq: Four times a day (QID) | ORAL | Status: DC | PRN
  Administered 2022-05-20: 4 mg via ORAL
  Filled 2022-05-13: qty 1

## 2022-05-13 MED ORDER — AMITRIPTYLINE HCL 50 MG PO TABS
50.0000 mg | ORAL_TABLET | Freq: Every day | ORAL | Status: DC
  Administered 2022-05-13 – 2022-05-20 (×8): 50 mg via ORAL
  Filled 2022-05-13 (×4): qty 1
  Filled 2022-05-13 (×2): qty 2
  Filled 2022-05-13 (×2): qty 1

## 2022-05-13 MED ORDER — LACTATED RINGERS IV BOLUS (SEPSIS)
1000.0000 mL | Freq: Once | INTRAVENOUS | Status: AC
  Administered 2022-05-13: 1000 mL via INTRAVENOUS

## 2022-05-13 MED ORDER — NITROGLYCERIN 0.4 MG SL SUBL: 0.4000 mg | SUBLINGUAL_TABLET | SUBLINGUAL | Status: DC | PRN

## 2022-05-13 MED ORDER — INSULIN ASPART 100 UNIT/ML IJ SOLN
0.0000 [IU] | Freq: Three times a day (TID) | INTRAMUSCULAR | Status: DC
  Administered 2022-05-13: 15 [IU] via SUBCUTANEOUS
  Administered 2022-05-14 (×2): 4 [IU] via SUBCUTANEOUS
  Administered 2022-05-14 – 2022-05-15 (×2): 7 [IU] via SUBCUTANEOUS
  Administered 2022-05-16 (×2): 4 [IU] via SUBCUTANEOUS
  Administered 2022-05-17: 3 [IU] via SUBCUTANEOUS
  Administered 2022-05-17: 4 [IU] via SUBCUTANEOUS
  Administered 2022-05-18: 7 [IU] via SUBCUTANEOUS
  Administered 2022-05-18 – 2022-05-19 (×2): 3 [IU] via SUBCUTANEOUS
  Administered 2022-05-19: 7 [IU] via SUBCUTANEOUS
  Administered 2022-05-20: 3 [IU] via SUBCUTANEOUS
  Administered 2022-05-20 – 2022-05-21 (×3): 4 [IU] via SUBCUTANEOUS
  Administered 2022-05-21: 7 [IU] via SUBCUTANEOUS

## 2022-05-13 MED ORDER — INSULIN ASPART 100 UNIT/ML IJ SOLN
0.0000 [IU] | Freq: Every day | INTRAMUSCULAR | Status: DC
  Administered 2022-05-13 – 2022-05-17 (×2): 2 [IU] via SUBCUTANEOUS
  Administered 2022-05-19: 4 [IU] via SUBCUTANEOUS

## 2022-05-13 MED ORDER — FAMOTIDINE 20 MG PO TABS
20.0000 mg | ORAL_TABLET | Freq: Every day | ORAL | Status: DC
  Administered 2022-05-13 – 2022-05-20 (×8): 20 mg via ORAL
  Filled 2022-05-13 (×8): qty 1

## 2022-05-13 MED ORDER — DAKINS (1/4 STRENGTH) 0.125 % EX SOLN
Freq: Two times a day (BID) | CUTANEOUS | Status: AC
  Administered 2022-05-15: 1 via TOPICAL
  Filled 2022-05-13 (×2): qty 473

## 2022-05-13 MED ORDER — VITAMIN B-12 1000 MCG PO TABS
1000.0000 ug | ORAL_TABLET | Freq: Every day | ORAL | Status: DC
  Administered 2022-05-14 – 2022-05-21 (×8): 1000 ug via ORAL
  Filled 2022-05-13 (×8): qty 1

## 2022-05-13 MED ORDER — LORAZEPAM 1 MG PO TABS
1.0000 mg | ORAL_TABLET | Freq: Every day | ORAL | Status: DC
  Administered 2022-05-13 – 2022-05-20 (×8): 1 mg via ORAL
  Filled 2022-05-13 (×8): qty 1

## 2022-05-13 MED ORDER — SODIUM CHLORIDE 0.9 % IV SOLN
2.0000 g | INTRAVENOUS | Status: DC
  Administered 2022-05-14 – 2022-05-18 (×5): 2 g via INTRAVENOUS
  Filled 2022-05-13 (×5): qty 20

## 2022-05-13 MED ORDER — ACETAMINOPHEN 325 MG PO TABS
650.0000 mg | ORAL_TABLET | Freq: Four times a day (QID) | ORAL | Status: DC | PRN
  Administered 2022-05-19: 650 mg via ORAL
  Filled 2022-05-13: qty 2

## 2022-05-13 MED ORDER — SODIUM CHLORIDE 0.9 % IV SOLN
2.0000 g | Freq: Once | INTRAVENOUS | Status: AC
  Administered 2022-05-13: 2 g via INTRAVENOUS
  Filled 2022-05-13: qty 20

## 2022-05-13 MED ORDER — ACETAMINOPHEN 650 MG RE SUPP: 650.0000 mg | Freq: Four times a day (QID) | RECTAL | Status: DC | PRN

## 2022-05-13 MED ORDER — ONDANSETRON HCL 4 MG/2ML IJ SOLN: 4.0000 mg | Freq: Four times a day (QID) | INTRAMUSCULAR | Status: DC | PRN

## 2022-05-13 MED ORDER — TAMSULOSIN HCL 0.4 MG PO CAPS
0.4000 mg | ORAL_CAPSULE | Freq: Every day | ORAL | Status: DC
  Administered 2022-05-14 – 2022-05-21 (×8): 0.4 mg via ORAL
  Filled 2022-05-13 (×8): qty 1

## 2022-05-13 MED ORDER — VANCOMYCIN HCL 2000 MG/400ML IV SOLN
2000.0000 mg | Freq: Once | INTRAVENOUS | Status: AC
  Administered 2022-05-13: 2000 mg via INTRAVENOUS
  Filled 2022-05-13: qty 400

## 2022-05-13 MED ORDER — ASPIRIN 81 MG PO TBEC
81.0000 mg | DELAYED_RELEASE_TABLET | Freq: Every day | ORAL | Status: DC
  Administered 2022-05-14 – 2022-05-21 (×8): 81 mg via ORAL
  Filled 2022-05-13 (×8): qty 1

## 2022-05-13 NOTE — ED Provider Notes (Signed)
[facility] EMERGENCY DEPARTMENT Provider Note   CSN: [id number] Arrival date & time: 05/13/22  1104     History  Chief Complaint  Patient presents with   Wound Check    Mike Floyd is a 61 y.o. male with history of diabetes mellitus type II and ulcer of left foot presents to the ED today with concerns of severe pain to the left foot and possible spreading infection.  Patient has ulceration to left submetatarsal fourth.  He has been seen by wound care.  He is not on any antibiotics at this time.  Patient states the pain in his foot has worsened and rates it as severe.  Patient was sent to ED from Dr. Nolon Rod office with concerns for infection and possible need of IV antibiotics.  Patient also reports CBGs at home range from 300s to 400s.  He states there is often some exudate present on bandages during changes.  Denies fever, chills, numbness, weakness to the lower extremities, myalgias, arthralgias.      Home Medications Prior to Admission medications   Medication Sig Start Date End Date Taking? Authorizing Provider  potassium chloride SA (KLOR-CON M) 20 MEQ tablet TAKE (1) TABLET BY MOUTH ONCE DAILY. 10/14/21   Josue Hector, MD  acarbose (PRECOSE) 100 MG tablet Take 100 mg by mouth 3 (three) times daily. 07/08/20   [provider]  acetaminophen-codeine (TYLENOL #3) 300-30 MG tablet Take 1-2 tablets by mouth every 4 (four) hours as needed for moderate pain. 03/05/22   Felipa Furnace, DPM  acetaminophen-codeine (TYLENOL #3) 300-30 MG tablet Take 1-2 tablets by mouth every 4 (four) hours as needed for moderate pain. 03/12/22   Felipa Furnace, DPM  amitriptyline (ELAVIL) 50 MG tablet Take 50 mg by mouth at bedtime. 12/04/13   [provider]  amoxicillin-clavulanate (AUGMENTIN) 875-125 MG tablet Take 1 tablet by mouth 2 (two) times daily. 10/06/21   Hyatt, Max T, DPM  aspirin 81 MG tablet Take 81 mg by mouth daily.    [provider]  atorvastatin (LIPITOR) 20 MG  tablet Take 20 mg by mouth daily.    [provider]  B-D ULTRAFINE III SHORT PEN 31G X 8 MM MISC USE WITH TRESBIA. 10/27/17   Cassandria Anger, MD  calcium carbonate (OSCAL) 1500 (600 Ca) MG TABS tablet Take 600 mg of elemental calcium by mouth daily with breakfast.    [provider]  carvedilol (COREG) 12.5 MG tablet TAKE 1 TABLET BY MOUTH TWICE DAILY. 03/18/22   Josue Hector, MD  clotrimazole-betamethasone (LOTRISONE) cream Apply topically 2 (two) times daily. 08/31/21   [provider]  famotidine (PEPCID) 20 MG tablet Take 20 mg by mouth daily.    [provider]  FARXIGA 10 MG TABS tablet Take 10 mg by mouth daily. 08/14/19   [provider]  furosemide (LASIX) 40 MG tablet TAKE (1) TABLET BY MOUTH ONCE DAILY. Patient taking differently: Take 40 mg by mouth daily. 08/06/19   Herminio Commons, MD  gabapentin (NEURONTIN) 600 MG tablet Take 300 mg by mouth 3 (three) times daily. 09/05/20   [provider]  glimepiride (AMARYL) 4 MG tablet Take 4 mg by mouth 2 (two) times daily.     [provider]  insulin degludec (TRESIBA FLEXTOUCH) 100 UNIT/ML SOPN FlexTouch Pen Inject 0.3 mLs (30 Units total) into the skin at bedtime. Patient taking differently: Inject 52 Units into the skin daily. 50 - 80  based on  CBG   AM 10/27/17   Cassandria Anger, MD  isosorbide mononitrate (IMDUR) 30 MG 24 hr tablet TAKE (1) TABLET BY MOUTH ONCE DAILY. Patient taking differently: Take 30 mg by mouth daily. 01/28/20   Herminio Commons, MD  LORazepam (ATIVAN) 1 MG tablet Take 1 mg by mouth at bedtime.    [provider]  mupirocin ointment (BACTROBAN) 2 % Apply 1 application topically 2 (two) times daily. 10/06/21   Hyatt, Max T, DPM  nitroGLYCERIN (NITROSTAT) 0.4 MG SL tablet Place 0.4 mg under the tongue every 5 (five) minutes as needed for chest pain.    [provider]  omeprazole (PRILOSEC) 40 MG capsule TAKE (1) CAPSULE  BY MOUTH ONCE DAILY. 01/14/21   [name], Mechele Dawley, MD  ONETOUCH VERIO test strip TESTING 4 TIMES DAILY. 12/25/18   Cassandria Anger, MD  oxycodone (ROXICODONE) 30 MG immediate release tablet Take 30-60 mg by mouth 4 (four) times daily as needed for pain. 12/16/20   [provider]  pantoprazole (PROTONIX) 40 MG tablet Take 40 mg by mouth 2 (two) times daily. 07/29/21   [provider]  SSD 1 % cream APPLY TO AFFECTED AREA DAILY AS DIRECTED. 06/11/21   [name], MD  sucralfate (CARAFATE) 1 g tablet TAKE 1 TABLET BY MOUTH 30 MINTUES BEFORE MEALS AND AT BEDTIME. Patient taking differently: Take by mouth 4 (four) times daily -  with meals and at bedtime. TAKE 1 TABLET BY MOUTH 30 MINTUES BEFORE MEALS AND AT BEDTIME. 06/03/20   Laurine Blazer B, PA-C  tamsulosin (FLOMAX) 0.4 MG CAPS capsule Take 0.4 mg by mouth daily. am    [provider]  vitamin B-12 (CYANOCOBALAMIN) 1000 MCG tablet Take 1,000 mcg by mouth daily.    [provider]      Allergies    Patient has no known allergies.    Review of Systems   Review of Systems  Constitutional:  Negative for chills and fever.  Musculoskeletal:  Negative for joint swelling.  Skin:  Positive for wound.       Wound to sole of left foot    Physical Exam Updated Vital Signs BP 103/82 (BP Location: Right Arm)   Pulse (!) 102   Temp (!) 97.4 F (36.3 C) (Oral)   Resp 20   Ht '6\' 1"'$  (1.854 m)   Wt 96.6 kg   SpO2 94%   BMI 28.10 kg/m  Physical Exam Vitals and nursing note reviewed.  Constitutional:      General: He is not in acute distress.    Appearance: Normal appearance. He is not ill-appearing or diaphoretic.  HENT:     Mouth/Throat:     Mouth: Mucous membranes are moist.     Pharynx: Oropharynx is clear.  Cardiovascular:     Rate and Rhythm: Normal rate and regular rhythm.     Pulses: Normal pulses.     Heart sounds: Normal heart sounds.  Pulmonary:     Effort: Pulmonary effort is normal.   Abdominal:     General: Abdomen is flat.     Palpations: Abdomen is soft.  Musculoskeletal:     Left lower leg: No swelling. No edema.     Comments: No erythema or streaking noted to left lower extremity aside from foot.  Feet:     Right foot:     Skin integrity: Callus and dry skin present.     Left foot:     Skin integrity: Ulcer, skin  breakdown, erythema, warmth and dry skin present.     Comments: Erythema and swelling noted to dorsal and plantar aspect of left foot.  Open wound and skin breakdown on plantar aspect below fourth digit of left foot.  No obvious exudate or drainage at this time.  Pungent odor present.  Dry and black skin discoloration also noted to heel on the plantar aspect of left foot. Skin:    General: Skin is warm and dry.     Capillary Refill: Capillary refill takes less than 2 seconds.  Neurological:     Mental Status: He is alert. Mental status is at baseline.  Psychiatric:        Mood and Affect: Mood normal.        Behavior: Behavior normal.     ED Results / Procedures / Treatments   Labs (all labs ordered are listed, but only abnormal results are displayed) Labs Reviewed  CULTURE, BLOOD (ROUTINE X 2)  CULTURE, BLOOD (ROUTINE X 2)  LACTIC ACID, PLASMA  LACTIC ACID, PLASMA  COMPREHENSIVE METABOLIC PANEL  CBC WITH DIFFERENTIAL/PLATELET  PROTIME-INR  APTT  URINALYSIS, ROUTINE W REFLEX MICROSCOPIC    EKG EKG Interpretation  Date/Time:  Thursday May 13 2022 12:31:53 EDT Ventricular Rate:  87 PR Interval:  172 QRS Duration: 100 QT Interval:  376 QTC Calculation: 453 R Axis:   79 Text Interpretation: Sinus rhythm Normal ECG Confirmed by Noemi Chapel [id number]) on 05/13/2022 12:45:39 PM  Radiology DG Foot Complete Left  Result Date: 05/13/2022 CLINICAL DATA:  Left foot wound. EXAM: LEFT FOOT - COMPLETE 3+ VIEW COMPARISON:  October 06, 2021. FINDINGS: Status post surgical fusion of the first tarsometatarsal joint. There appears to be lytic  destruction involving the distal portion of the fourth metatarsal and the proximal base of the fourth proximal phalanx concerning for osteomyelitis. Mild posterior calcaneal spurring is noted. IMPRESSION: Lytic destruction involving distal portion of fourth metatarsal and proximal base of fourth proximal phalanx concerning for osteomyelitis. Electronically Signed   By: Marijo Conception M.D.   On: 05/13/2022 12:49   DG Chest Port 1 View  Result Date: 05/13/2022 CLINICAL DATA:  Sepsis. EXAM: PORTABLE CHEST 1 VIEW COMPARISON:  Dec 29, 2017. FINDINGS: The heart size and mediastinal contours are within normal limits. Both lungs are clear. The visualized skeletal structures are unremarkable. IMPRESSION: No active disease. Electronically Signed   By: Marijo Conception M.D.   On: 05/13/2022 12:47    Procedures Procedures    Medications Ordered in ED Medications  lactated ringers infusion (has no administration in time range)  cefTRIAXone (ROCEPHIN) 2 g in sodium chloride 0.9 % 100 mL IVPB (has no administration in time range)  lactated ringers bolus 1,000 mL (has no administration in time range)    And  lactated ringers bolus 1,000 mL (has no administration in time range)    And  lactated ringers bolus 1,000 mL (has no administration in time range)  morphine (PF) 4 MG/ML injection 4 mg (has no administration in time range)  vancomycin (VANCOREADY) IVPB 2000 mg/400 mL (has no administration in time range)    ED Course/ Medical Decision Making/ A&P                           Medical Decision Making Amount and/or Complexity of Data Reviewed Labs: ordered. Radiology: ordered. ECG/medicine tests: ordered.  Risk Prescription drug management. Decision regarding hospitalization.   This patient presents to the ED for  concern of severe ulcerative wound to left foot, this involves an extensive number of treatment options, and is a complaint that carries with it a high risk of complications and  morbidity.  The differential diagnosis includes bacterial sepsis, osteomyelitis, cellulitis of left lower extremity.   Co morbidities that complicate the patient evaluation  T2DM poorly controlled, CKD   Additional history obtained:  Additional history obtained from patient's wife. External records from outside source obtained and reviewed including office visit from 04/23/2022 addressing ulcer of left foot   Lab Tests:  I Ordered, and personally interpreted labs.  The pertinent results include: WBC 6.5, lactic acid 1.5, glucose 469   Imaging Studies ordered:  I ordered imaging studies including x-ray left foot, x-ray chest I independently visualized and interpreted imaging which showed lytic destruction of fourth metatarsal concerning for osteomyelitis.  Chest x-ray showed normal heart size and clear lungs. I agree with the radiologist interpretation   Cardiac Monitoring: / EKG:  The patient was maintained on a cardiac monitor.  I personally viewed and interpreted the cardiac monitored which showed an underlying rhythm of: Sinus rhythm   Consultations Obtained:  I requested consultation with the hospitalist team,  and discussed lab and imaging findings as well as pertinent plan - they recommend: admitting patient   Problem List / ED Course / Critical interventions / Medication management  Osteomyelitis, cellulitis of left foot I ordered medication including LR, vancomycin, Rocephin for possible sepsis.  Also ordered morphine for pain control. Reevaluation of the patient after these medicines showed that the patient improved   Social Determinants of Health:  Former tobacco user   Test / Admission - Considered:  Patient will be admitted for treatment/management of lower left extremity cellulitis and probable osteomyelitis  Discussed assessment and plan with Dr. Sabra Heck who agreed.  Patient presented to ED with concerns of possible sepsis with an obvious source of  infection.  Patient has severe ulceration and skin breakdown of his left foot with erythema, warmth, and swelling.  I have concerns for severe cellulitis that requires IV antibiotic treatment and management.  Patient also has poorly controlled blood glucose, CMP showed 469, which will also need to be managed.  Given that patient does not have leukocytosis and has a normal lactic acid, sepsis is unlikely at this time.  Plan to admit patient for treatment/management of cellulitis of left foot and suspected osteomyelitis. Consulted with hospitalist team who recommended admitting patient.         Final Clinical Impression(s) / ED Diagnoses Final diagnoses:  None    Rx / DC Orders ED Discharge Orders     None         Pat Kocher, [place] 05/14/22 1300    Noemi Chapel, MD 05/14/22 2230

## 2022-05-13 NOTE — Progress Notes (Signed)
Pharmacy Antibiotic Note  Mike Floyd is a 61 y.o. male admitted on 05/13/2022 with cellulitis.  Pharmacy has been consulted for vancomycin dosing.  Plan: Vancomycin 2000 mg IV x 1 dose. Vancomycin 1000 mg IV every 12 hours. Monitor labs, c/s, and vanco level as indicated.  Height: '6\' 1"'$  (185.4 cm) Weight: 96.6 kg (213 lb) IBW/kg (Calculated) : 79.9  Temp (24hrs), Avg:97.4 F (36.3 C), Min:97.4 F (36.3 C), Max:97.4 F (36.3 C)  Recent Labs  Lab 05/13/22 1207  WBC 6.5  CREATININE 1.15  LATICACIDVEN 1.5    Estimated Creatinine Clearance: 81.6 mL/min (by C-G formula based on SCr of 1.15 mg/dL).    No Known Allergies  Antimicrobials this admission: Vanco 10/5 >> CTX 10/5 >>  Microbiology results: 10/5 BCx: pending  Thank you for allowing pharmacy to be a part of this patient's care.  Ramond Craver 05/13/2022 2:08 PM

## 2022-05-13 NOTE — H&P (Signed)
History and Physical    Mike Floyd OJJ:[id number] DOB: 03/13/1961 DOA: 05/13/2022  PCP: Redmond School, MD   Patient coming from: PCP office  Chief Complaint: Worsening edema/pain/erythema to left foot  HPI: Mike Floyd is a 61 y.o. male with medical history significant for poorly controlled type 2 diabetes, GERD, hypertension, dyslipidemia, CAD, and neuropathy who presented to his PCP office with worsening pain, swelling, and redness to his left foot.  He was redirected here for admission for IV antibiotics due to concern for underlying osteomyelitis.  He has had trouble managing his blood glucose at home recently despite change in diet.  He has been following up with wound care outpatient and despite this, he has been having worsening pain and erythema.   ED Course: Vital signs with some mild tachycardia.  He is noted to have some mild hyponatremia and blood glucose elevated at 469.  Chest x-ray with no acute findings and left foot x-ray with concerns for fourth metatarsal osteomyelitis.  He has been started on vancomycin and Rocephin.  Review of Systems: Reviewed as noted above, otherwise negative.  Past Medical History:  Diagnosis Date   AICD (automatic cardioverter/defibrillator) present 11/2007   a. 11/2007 SJM Current VR - single lead ICD  - Removed 2018 - "it was burning me"   Anxiety    CAD (coronary artery disease)    non-obstructive CAD by Cor CT in 2020 // Myoview 3/22: EF 57, small inf-sept defect c/w scar, no ischemia; low risk      Chest pain    a. 10/2007 Cath:  normal Cors.   CKD (chronic kidney disease), stage II    DDD (degenerative disc disease), lumbar    Diabetes mellitus DX: 2010   Erosive esophagitis    a. per EGD (08/2011), Dr. Laural Golden - Erosive reflux esophagitis improved but not completely healed since previous EGD 3 years ago. Bx showing  ulcerated gatroesophageal junction mucosa. negative for H. pylori   GERD (gastroesophageal reflux disease)    Gout     Hearing deficit    a. wear bilateral hearing aides   History of hiatal hernia    Hypertension    Mildly dilatd aortic root ([place])    CMR 4/22: EF 52, no LGE; d/w Dr. Marlowe Kays root 38 mm (mildly dilated)   Myocardial infarction [facility]) 2011   Neuropathy    Feet and legs   Nonischemic dilated cardiomyopathy ([place])    a. H/O EF as low as 35-40% by LV gram 10/2007;  b. Echo 02/2011 EF 50-55%, inf HK, Gr 1 DD // CMR 4/22: EF 52, no LGE; d/w Dr. Marlowe Kays root 38 mm (mildly dilated)    Renal insufficiency    Sleep apnea    pt doesnt use, states"I cant afford one". PCP aware   Stroke ([place])    mini-stroke in 2014   TIA (transient ischemic attack)    July, 2013   Tobacco abuse, in remission 06/27/2009   Discontinued in 2009     Wears dentures    top plate   WPW (Wolff-Parkinson-White syndrome)    a. s/p RFCA @ [place]    Past Surgical History:  Procedure Laterality Date   Stre[place]     2009   CARDIAC [place], LAPAROSCOPIC     11/2007   COLONOSCOPY W/ POLYPECTOMY  2009  ELBOW SURGERY Left 06/2010   ESOPHAGEAL DILATION N/A 11/08/2014   Procedure: ESOPHAGEAL DILATION;  Surgeon: Rogene Houston, MD;  Location: [facility];  Service: Endoscopy;  Laterality: N/A;  #56,    ESOPHAGOGASTRODUODENOSCOPY  03/31/2012   also 08/2011; [name]   ESOPHAGOGASTRODUODENOSCOPY (EGD) WITH PROPOFOL N/A 11/08/2014   Procedure: ESOPHAGOGASTRODUODENOSCOPY (EGD) WITH PROPOFOL;  Surgeon: Rogene Houston, MD;  Location: [facility];  Service: Endoscopy;  Laterality: N/A;  [place] is 82 , [place] is 37   FOOT ARTHRODESIS Left 10/24/2020   Procedure: LEFT GASTROCNEMIUS RECESSION, DORSIFLEXION OSTEOTOMY 1ST MT;  Surgeon: [name], MD;  Location: [place];  Service: Orthopedics;  Laterality: Left;   FOOT ARTHRODESIS Right 01/09/2021   Procedure: CLOSING WEDGE OSTEOTOMY RIGHT 1ST METATARSAL;  Surgeon: [name], MD;  Location: [place];  Service: Orthopedics;  Laterality: Right;   GASTROCNEMIUS RECESSION Right 01/09/2021   Procedure: RIGHT GASTROCNEMIUS RECESSION;  Surgeon: [name], MD;  Location: [place];  Service: Orthopedics;  Laterality: Right;   ICD LEAD REMOVAL N/A 10/07/2016   Procedure: ICD LEAD REMOVAL ;  Surgeon: Evans Lance, MD;  Location: [place];  Service: Cardiovascular;  Laterality: N/A;   [place]; performed at [facility]   TEE WITHOUT CARDIOVERSION N/A 10/07/2016   Procedure: TRANSESOPHAGEAL ECHOCARDIOGRAM (TEE);  Surgeon: Evans Lance, MD;  Location: [place];  Service: Cardiovascular;  Laterality: N/A;     reports that he quit smoking about 11 years ago. His smoking use included cigarettes. He started smoking about 50 years ago. He has a 52.50 pack-year smoking history. He quit smokeless tobacco use about 28 years ago.  His smokeless tobacco use included chew. He reports that he does not drink alcohol and does not use drugs.  No Known Allergies  Family History  Problem Relation Age of Onset   Heart attack Mother 15   Hypertension Mother    Diabetes Mother    Kidney disease Mother    Breast cancer Mother    Heart attack Father 3   Hypertension Father    Diabetes Father    Stroke Brother    Heart attack Brother 22   Stroke Maternal Grandmother 80   Diabetes Brother    Hypertension Brother     Prior to Admission medications   Medication Sig Start Date End Date Taking? Authorizing Provider  amitriptyline (ELAVIL) 50 MG tablet Take 50 mg by mouth at bedtime. 12/04/13  Yes [provider]  aspirin 81 MG tablet Take 81 mg by mouth daily.   Yes [provider]  atorvastatin (LIPITOR) 20 MG tablet Take 20 mg by mouth daily.   Yes [provider]  calcium carbonate (OSCAL) 1500 (600 Ca) MG TABS tablet Take 600 mg of elemental calcium by mouth daily with breakfast.   Yes [provider]  carvedilol (COREG) 12.5 MG tablet TAKE 1 TABLET BY MOUTH TWICE DAILY. 03/18/22  Yes Josue Hector, MD  famotidine (PEPCID) 20 MG tablet Take 20 mg by mouth daily.   Yes [provider]  furosemide (LASIX) 40 MG tablet TAKE (1) TABLET BY MOUTH ONCE DAILY. Patient taking differently: Take 40 mg by mouth daily. 08/06/19  Yes Herminio Commons, MD  gabapentin (NEURONTIN) 600 MG tablet Take 300 mg by mouth 3 (three) times daily. 09/05/20  Yes [provider]  isosorbide mononitrate (IMDUR) 30 MG 24 hr tablet TAKE (1) TABLET  BY MOUTH ONCE DAILY. Patient taking differently: Take 30 mg by mouth daily. 01/28/20  Yes Herminio Commons, MD  LORazepam (ATIVAN) 1 MG tablet Take 1 mg by mouth at bedtime.   Yes [provider]  nitroGLYCERIN (NITROSTAT) 0.4 MG SL tablet Place 0.4 mg under the tongue every 5 (five) minutes as needed for chest pain.   Yes [provider]  NOVOLOG MIX 70/30 FLEXPEN (70-30) 100 UNIT/ML FlexPen Inject 50 Units into the skin 2 (two) times daily. 03/05/22  Yes [provider]  oxycodone (ROXICODONE) 30 MG immediate release tablet Take 30-60 mg by mouth 4 (four) times daily as needed for pain. 12/16/20  Yes [provider]  pantoprazole (PROTONIX) 40 MG tablet Take 40 mg by mouth 2 (two) times daily. 07/29/21  Yes [provider]  potassium chloride SA (KLOR-CON M) 20 MEQ tablet TAKE (1) TABLET BY MOUTH ONCE DAILY. 10/14/21  Yes Josue Hector, MD  tamsulosin (FLOMAX) 0.4 MG CAPS capsule Take 0.4 mg by mouth daily. am   Yes [provider]  vitamin B-12 (CYANOCOBALAMIN) 1000 MCG tablet Take 1,000 mcg by mouth daily.   Yes [provider]  acetaminophen-codeine (TYLENOL #3) 300-30 MG tablet Take 1-2 tablets by mouth every 4 (four) hours as needed for moderate pain. Patient not taking: Reported on 05/13/2022 03/05/22   Felipa Furnace, DPM  acetaminophen-codeine (TYLENOL #3) 300-30 MG tablet  Take 1-2 tablets by mouth every 4 (four) hours as needed for moderate pain. Patient not taking: Reported on 05/13/2022 03/12/22   Felipa Furnace, DPM  amoxicillin-clavulanate (AUGMENTIN) 875-125 MG tablet Take 1 tablet by mouth 2 (two) times daily. Patient not taking: Reported on 05/13/2022 10/06/21   Tyson Dense T, DPM  B-D ULTRAFINE III SHORT PEN 31G X 8 MM MISC USE WITH TRESBIA. 10/27/17   Cassandria Anger, MD  clotrimazole-betamethasone (LOTRISONE) cream Apply topically 2 (two) times daily. 08/31/21   [provider]  insulin degludec (TRESIBA FLEXTOUCH) 100 UNIT/ML SOPN FlexTouch Pen Inject 0.3 mLs (30 Units total) into the skin at bedtime. Patient not taking: Reported on 05/13/2022 10/27/17   Cassandria Anger, MD  mupirocin ointment (BACTROBAN) 2 % Apply 1 application topically 2 (two) times daily. Patient not taking: Reported on 05/13/2022 10/06/21   Hyatt, Max T, DPM  omeprazole (PRILOSEC) 40 MG capsule TAKE (1) CAPSULE BY MOUTH ONCE DAILY. Patient not taking: Reported on 05/13/2022 01/14/21   Rogene Houston, MD  [facility] VERIO test strip TESTING 4 TIMES DAILY. 12/25/18   Cassandria Anger, MD  SSD 1 % cream APPLY TO AFFECTED AREA DAILY AS DIRECTED. Patient not taking: Reported on 05/13/2022 06/11/21   [name], MD  sucralfate (CARAFATE) 1 g tablet TAKE 1 TABLET BY MOUTH 30 MINTUES BEFORE MEALS AND AT BEDTIME. Patient taking differently: Take by mouth 4 (four) times daily -  with meals and at bedtime. TAKE 1 TABLET BY MOUTH 30 MINTUES BEFORE MEALS AND AT BEDTIME. 06/03/20   Ezzard Standing, [place]    Physical Exam: Vitals:   05/13/22 1144 05/13/22 1148 05/13/22 1300 05/13/22 1315  BP:  103/82 105/75   Pulse:  (!) 102 81 82  Resp:  '20 10 10  '$ Temp:  (!) 97.4 F (36.3 C)    TempSrc:  Oral    SpO2:  94% 97% 100%  Weight: 96.6 kg     Height: '6\' 1"'$  (1.854 m)       Constitutional: NAD, calm, comfortable Vitals:  05/13/22 1144 05/13/22 1148 05/13/22 1300 05/13/22 1315   BP:  103/82 105/75   Pulse:  (!) 102 81 82  Resp:  '20 10 10  '$ Temp:  (!) 97.4 F (36.3 C)    TempSrc:  Oral    SpO2:  94% 97% 100%  Weight: 96.6 kg     Height: '6\' 1"'$  (1.854 m)      Eyes: lids and conjunctivae normal Neck: normal, supple Respiratory: clear to auscultation bilaterally. Normal respiratory effort. No accessory muscle use.  Cardiovascular: Regular rate and rhythm, no murmurs. Abdomen: no tenderness, no distention. Bowel sounds positive.  Musculoskeletal:  No edema. Skin: no rashes, lesions, ulcers.  Psychiatric: Flat affect  Labs on Admission: I have personally reviewed following labs and imaging studies  CBC: Recent Labs  Lab 05/13/22 1207  WBC 6.5  NEUTROABS 5.0  HGB 14.3  HCT 40.9  MCV 82.6  PLT 903   Basic Metabolic Panel: Recent Labs  Lab 05/13/22 1207  NA 131*  K 4.6  CL 94*  CO2 26  GLUCOSE 469*  BUN 13  CREATININE 1.15  CALCIUM 9.0   GFR: Estimated Creatinine Clearance: 81.6 mL/min (by C-G formula based on SCr of 1.15 mg/dL). Liver Function Tests: Recent Labs  Lab 05/13/22 1207  AST 14*  ALT 16  ALKPHOS 179*  BILITOT 1.9*  PROT 7.7  ALBUMIN 4.2   No results for input(s): "LIPASE", "AMYLASE" in the last 168 hours. No results for input(s): "AMMONIA" in the last 168 hours. Coagulation Profile: Recent Labs  Lab 05/13/22 1207  INR 1.1   Cardiac Enzymes: No results for input(s): "CKTOTAL", "CKMB", "CKMBINDEX", "TROPONINI" in the last 168 hours. BNP (last 3 results) No results for input(s): "PROBNP" in the last 8760 hours. HbA1C: No results for input(s): "HGBA1C" in the last 72 hours. CBG: No results for input(s): "GLUCAP" in the last 168 hours. Lipid Profile: No results for input(s): "CHOL", "HDL", "LDLCALC", "TRIG", "CHOLHDL", "LDLDIRECT" in the last 72 hours. Thyroid Function Tests: No results for input(s): "TSH", "T4TOTAL", "FREET4", "T3FREE", "THYROIDAB" in the last 72 hours. Anemia Panel: No results for input(s):  "VITAMINB12", "FOLATE", "FERRITIN", "TIBC", "IRON", "RETICCTPCT" in the last 72 hours. Urine analysis:    Component Value Date/Time   COLORURINE YELLOW 03/07/2012 Carbon 03/07/2012 1634   LABSPEC 1.021 03/07/2012 1634   PHURINE 7.0 03/07/2012 1634   GLUCOSEU 250 (A) 03/07/2012 1634   HGBUR NEGATIVE 03/07/2012 1634   BILIRUBINUR NEGATIVE 03/07/2012 1634   KETONESUR 15 (A) 03/07/2012 1634   PROTEINUR NEGATIVE 03/07/2012 1634   UROBILINOGEN 0.2 03/07/2012 1634   NITRITE NEGATIVE 03/07/2012 1634   LEUKOCYTESUR NEGATIVE 03/07/2012 1634    Radiological Exams on Admission: DG Foot Complete Left  Result Date: 05/13/2022 CLINICAL DATA:  Left foot wound. EXAM: LEFT FOOT - COMPLETE 3+ VIEW COMPARISON:  October 06, 2021. FINDINGS: Status post surgical fusion of the first tarsometatarsal joint. There appears to be lytic destruction involving the distal portion of the fourth metatarsal and the proximal base of the fourth proximal phalanx concerning for osteomyelitis. Mild posterior calcaneal spurring is noted. IMPRESSION: Lytic destruction involving distal portion of fourth metatarsal and proximal base of fourth proximal phalanx concerning for osteomyelitis. Electronically Signed   By: Marijo Conception M.D.   On: 05/13/2022 12:49   DG Chest Port 1 View  Result Date: 05/13/2022 CLINICAL DATA:  Sepsis. EXAM: PORTABLE CHEST 1 VIEW COMPARISON:  Dec 29, 2017. FINDINGS: The heart size and mediastinal contours are within  normal limits. Both lungs are clear. The visualized skeletal structures are unremarkable. IMPRESSION: No active disease. Electronically Signed   By: Marijo Conception M.D.   On: 05/13/2022 12:47    EKG: Independently reviewed. SR 87 bpm.  Assessment/Plan Principal Problem:   Osteomyelitis (HCC) Active Problems:   DM type 2 causing vascular disease (HCC)   GERD (gastroesophageal reflux disease)   HTN (hypertension)   Chronic kidney disease (CKD), stage III (moderate)  (HCC)   Mixed hyperlipidemia   CAD (coronary artery disease)    Left foot osteomyelitis in patient with diabetic foot ulcer Continue on IV vancomycin and Rocephin Follow blood cultures Obtain bilateral ABI Wound care consultation May require general surgery consultation for consideration of debridement/amputation versus vascular evaluation  Hyperglycemia in the setting of poorly controlled type 2 diabetes and infection Maintain on aggressive SSI and long-acting insulin Carb modified diet Hemoglobin A1c Diabetes coordinator  CAD/dyslipidemia Continue aspirin and statin  Diabetic neuropathy Continue home medications of Elavil and gabapentin  GERD Continue PPI and Carafate  BPH Continue tamsulosin  Hypertension Soft blood pressure readings noted Hold Imdur, Coreg, and Lasix for now Hydralazine as needed for severe elevations   DVT prophylaxis: Lovenox Code Status: Full Family Communication: Wife at bedside Disposition Plan:Admit for osteomyelitis evaluation Consults called:None Admission status: Inpatient, Tele  Severity of Illness: The appropriate patient status for this patient is INPATIENT. Inpatient status is judged to be reasonable and necessary in order to provide the required intensity of service to ensure the patient's safety. The patient's presenting symptoms, physical exam findings, and initial radiographic and laboratory data in the context of their chronic comorbidities is felt to place them at high risk for further clinical deterioration. Furthermore, it is not anticipated that the patient will be medically stable for discharge from the hospital within 2 midnights of admission.   * I certify that at the point of admission it is my clinical judgment that the patient will require inpatient hospital care spanning beyond 2 midnights from the point of admission due to high intensity of service, high risk for further deterioration and high frequency of surveillance  required.*   Ladora Osterberg D Laqueena Hinchey DO [facility]  If 7PM-7AM, please contact night-coverage [email or web address]  05/13/2022, 2:07 PM

## 2022-05-13 NOTE — Consult Note (Signed)
WOC Nurse Consult Note: Patient receiving care in [facility] ED[facility].  Reason for Consult:  L foot wounds/ulcers with osteomyelitis; pics in chart Wound type: full thickness wound to left plantar foot at area adjacent to toes 4 and 5.  Eschar on plantar left heel Pressure Injury POA: Yes/No/NA Measurement: to be determined Wound bed: see photo  Drainage (amount, consistency, odor) to be determined Periwound: intact, very painful lesions Dressing procedure/placement/frequency: Moisten gauze with Dakin's. Place gauze into left plantar foot wound at the area between the 4th & 5th toes. Cover with dry gauze, secure with kerlix.  For the eschar on the heel I have ordered twice daily application of iodine and dry gauze.  Please consult surgery for potential surgical options, if not already underway.  The treatment of osteomyelitis exceeds the scope of practice of the [place] nurse. The topical approaches listed above may prove beneficial in reducing microbial contamination; they will not solve the patient's problem.  Thank you for the consult.  [place] nurse will not follow at this time.  Please re-consult the [place] team if needed.  Val Riles, RN, MSN, CWOCN, CNS-BC, pager [phone]

## 2022-05-13 NOTE — Inpatient Diabetes Management (Signed)
Inpatient Diabetes Program Recommendations  AACE/ADA: New Consensus Statement on Inpatient Glycemic Control (2015)  Target Ranges:  Prepandial:   less than 140 mg/dL      Peak postprandial:   less than 180 mg/dL (1-2 hours)      Critically ill patients:  140 - 180 mg/dL   Lab Results  Component Value Date   GLUCAP 96 01/09/2021   HGBA1C 7.0 (H) 06/15/2017    Latest Reference Range & Units 05/13/22 12:07  Sodium 135 - 145 mmol/L 131 (L)  Potassium 3.5 - 5.1 mmol/L 4.6  Chloride 98 - 111 mmol/L 94 (L)  CO2 22 - 32 mmol/L 26  Glucose 70 - 99 mg/dL 469 (H)  BUN 8 - 23 mg/dL 13  Creatinine 0.61 - 1.24 mg/dL 1.15  Calcium 8.9 - 10.3 mg/dL 9.0  Anion gap 5 - 15  11  Alkaline Phosphatase 38 - 126 U/L 179 (H)  Albumin 3.5 - 5.0 g/dL 4.2  AST 15 - 41 U/L 14 (L)  ALT 0 - 44 U/L 16  Total Protein 6.5 - 8.1 g/dL 7.7  Total Bilirubin 0.3 - 1.2 mg/dL 1.9 (H)  GFR, Estimated >60 mL/min >60  (L): Data is abnormally low (H): Data is abnormally high  Diabetes history: DM2 Outpatient Diabetes medications: 70/30 50 units bid Current orders for Inpatient glycemic control: 70/30 50 units bid, Novolog 0-20 units tid, 0-5 units hs  Inpatient Diabetes Program Recommendations:   Noted current orders with above insulin doses. Pending A1c. Will follow and speak with patient after admission.  Thank you, Nani Gasser. Jamine Highfill, RN, MSN, CDE  Diabetes Coordinator Inpatient Glycemic Control Team Team Pager [phone] (8am-5pm) 05/13/2022 3:01 PM

## 2022-05-13 NOTE — ED Provider Notes (Signed)
Medical screening examination/treatment/procedure(s) were conducted as a shared visit with non-physician practitioner(s) and myself.  I personally evaluated the patient during the encounter.  Clinical Impression:   Final diagnoses:  None      Patient is a chronically ill 61 year old male with known diabetes, poorly controlled, presents with worsening pain swelling and redness of his left foot.  Showed up at his doctor's office today and was redirected here for admission for IV antibiotics and what appears to be deep diabetic infected ulcers, foul smell and possible underlying osteomyelitis.  On exam I find the exact same findings that he was sent here for, he has a cellulitic looking foot with several deep ulcers over the heel as well as over the plantar aspect of the front of the foot, the redness extends up towards the ankle.  There is streaking going up the leg.  He does not have a fever but he is tachycardic to 102 bpm.  I suspect he is septic from skin and soft tissue infection, he will need to be admitted to the hospital, treated with antibiotics, he may need surgical consultation depending on imaging and osteomyelitis work-up.  At this time he does not need emergent surgery but will need to be admitted to the hospital.  .Critical Care  Performed by: Noemi Chapel, MD Authorized by: Noemi Chapel, MD   Critical care provider statement:    Critical care time (minutes):  30   Critical care time was exclusive of:  Separately billable procedures and treating other patients and teaching time   Critical care was necessary to treat or prevent imminent or life-threatening deterioration of the following conditions:  Sepsis   Critical care was time spent personally by me on the following activities:  Development of treatment plan with patient or surrogate, discussions with consultants, evaluation of patient's response to treatment, examination of patient, ordering and review of laboratory studies,  ordering and review of radiographic studies, ordering and performing treatments and interventions, pulse oximetry, re-evaluation of patient's condition, review of old charts and obtaining history from patient or surrogate   I assumed direction of critical care for this patient from another provider in my specialty: no     Care discussed with: admitting provider   Comments:           Noemi Chapel, MD 05/14/22 1236

## 2022-05-13 NOTE — ED Triage Notes (Signed)
Pt sent from [place]'s office for pt's left foot wound.  Pt states has had this wound for a year and seeing wound center.  Pt with severe pain.  Concerned for infection. Pt states CBG's at home from 300-400's

## 2022-05-14 DIAGNOSIS — M869 Osteomyelitis, unspecified: Secondary | ICD-10-CM | POA: Diagnosis not present

## 2022-05-14 LAB — BASIC METABOLIC PANEL
Anion gap: 8 (ref 5–15)
BUN: 10 mg/dL (ref 8–23)
CO2: 26 mmol/L (ref 22–32)
Calcium: 8.7 mg/dL — ABNORMAL LOW (ref 8.9–10.3)
Chloride: 105 mmol/L (ref 98–111)
Creatinine, Ser: 0.92 mg/dL (ref 0.61–1.24)
GFR, Estimated: >60 mL/min (ref >60)
Glucose, Bld: 133 mg/dL — ABNORMAL HIGH (ref 70–99)
Potassium: 3.6 mmol/L (ref 3.5–5.1)
Sodium: 139 mmol/L (ref 135–145)

## 2022-05-14 LAB — MAGNESIUM: Magnesium: 1.8 mg/dL (ref 1.7–2.4)

## 2022-05-14 LAB — CBC
HCT: 38.3 % — ABNORMAL LOW (ref 39.0–52.0)
Hemoglobin: 13.5 g/dL (ref 13.0–17.0)
MCH: 29.7 pg (ref 26.0–34.0)
MCHC: 35.2 g/dL (ref 30.0–36.0)
MCV: 84.2 fL (ref 80.0–100.0)
Platelets: 191 10*3/uL (ref 150–400)
RBC: 4.55 MIL/uL (ref 4.22–5.81)
RDW: 13.7 % (ref 11.5–15.5)
WBC: 5 10*3/uL (ref 4.0–10.5)
nRBC: 0 % (ref 0.0–0.2)

## 2022-05-14 LAB — GLUCOSE, CAPILLARY
Glucose-Capillary: 158 mg/dL — ABNORMAL HIGH (ref 70–99)
Glucose-Capillary: 168 mg/dL — ABNORMAL HIGH (ref 70–99)
Glucose-Capillary: 215 mg/dL — ABNORMAL HIGH (ref 70–99)

## 2022-05-14 LAB — LACTIC ACID, PLASMA: Lactic Acid, Venous: 1.1 mmol/L (ref 0.5–1.9)

## 2022-05-14 MED ORDER — LIVING WELL WITH DIABETES BOOK: Freq: Once | Status: AC

## 2022-05-14 NOTE — Care Management Important Message (Signed)
Important Message  Patient Details  Name: Mike Floyd MRN: [id number] Date of Birth: April 09, 1961   Medicare Important Message Given:  Yes     Tommy Medal 05/14/2022, 1:46 PM

## 2022-05-14 NOTE — TOC Progression Note (Signed)
Transition of Care [facility]) - Progression Note    Patient Details  Name: NAMEER SUMMER MRN: [id number] Date of Birth: 05-22-1961  Transition of Care [facility]) CM/SW Contact  Salome Arnt, [place] Phone Number: 05/14/2022, 10:18 AM  Clinical Narrative:  Transition of Care (TOC) Screening Note   Patient Details  Name: LILBURN STRAW Date of Birth: Jul 29, 1961   Transition of Care [facility]) CM/SW Contact:    Salome Arnt, [place] Phone Number: 05/14/2022, 10:18 AM    Transition of Care Department [facility]) has reviewed patient and no TOC needs have been identified at this time. We will continue to monitor patient advancement through interdisciplinary progression rounds. If new patient transition needs arise, please place a TOC consult.             Expected Discharge Plan and Services                                                 Social Determinants of Health (SDOH) Interventions    Readmission Risk Interventions     No data to display

## 2022-05-14 NOTE — Progress Notes (Signed)
Wound care to L foot completed per order.

## 2022-05-14 NOTE — Inpatient Diabetes Management (Addendum)
Inpatient Diabetes Program Recommendations  AACE/ADA: New Consensus Statement on Inpatient Glycemic Control (2015)  Target Ranges:  Prepandial:   less than 140 mg/dL      Peak postprandial:   less than 180 mg/dL (1-2 hours)      Critically ill patients:  140 - 180 mg/dL   Lab Results  Component Value Date   GLUCAP 168 (H) 05/14/2022   HGBA1C 9.7 (H) 05/13/2022    Latest Reference Range & Units 05/13/22 16:36 05/13/22 21:16 05/14/22 07:31 05/14/22 11:05  Glucose-Capillary 70 - 99 mg/dL 338 (H) 203 (H) 158 (H) 168 (H)  (H): Data is abnormally high  Diabetes history: DM2 Outpatient Diabetes medications: 70/30 50 units bid Current orders for Inpatient glycemic control: 70/30 50 units bid, Novolog 0-20 units tid, 0-5 units hs  Inpatient Diabetes Program Recommendations:   A1c 9.7 (average blood glucose 232 over the past 2-3 months) Attempted to call room phone without answer. Staff to assist with phone access in room. DM coordinator working remotely today. Ordered Living Well with Diabetes book for pt. And will reattempt for contact. Spoke with patient via phone and reviewed A1c and risks from elevated glucose. Reviewed timing of insulin, basic nutrition plate method, hypoglycemia and and discussed importance of checking CBGs and followup with PCP. Patient states he was surprised to know his A1c was high and wants to adjust to improve.   Thank you, Nani Gasser. Kristy Schomburg, RN, MSN, CDE  Diabetes Coordinator Inpatient Glycemic Control Team Team Pager [phone] (8am-5pm) 05/14/2022 11:56 AM

## 2022-05-14 NOTE — Progress Notes (Signed)
PROGRESS NOTE    Mike Floyd  VOH:[id number] DOB: 1961-02-05 DOA: 05/13/2022 PCP: Redmond School, MD   Brief Narrative:  Mike Floyd is a 61 y.o. male with medical history significant for poorly controlled type 2 diabetes, GERD, hypertension, dyslipidemia, CAD, and neuropathy who presented to his PCP office with worsening pain, swelling, and redness to his left foot.  He was redirected here for admission for IV antibiotics due to concern for underlying osteomyelitis.  He is indeed noted to have osteomyelitis to his fourth and fifth metatarsal on MRI.  General surgery has recommended consultation with Dr. Sharol Given who agrees to see patient for consideration of amputation by 10/9.  He continues to remain on IV antibiotics for now.  Assessment & Plan:   Principal Problem:   Osteomyelitis ([place]) Active Problems:   DM type 2 causing vascular disease (HCC)   GERD (gastroesophageal reflux disease)   AICD (automatic cardioverter/defibrillator)   HTN (hypertension)   Chronic kidney disease (CKD), stage III (moderate) (HCC)   Mixed hyperlipidemia   CAD (coronary artery disease)  Assessment and Plan:   Left foot osteomyelitis in patient with diabetic foot ulcer Continue on IV vancomycin and Rocephin Blood cultures with no growth noted Dr. Sharol Given to see patient for amputation by 10/9   Hyperglycemia in the setting of poorly controlled type 2 diabetes and infection Maintain on aggressive SSI and long-acting insulin Carb modified diet Hemoglobin A1c 9.7% Diabetes coordinator appreciated   CAD/dyslipidemia Continue aspirin and statin   Diabetic neuropathy Continue home medications of Elavil and gabapentin   GERD Continue PPI and Carafate   BPH Continue tamsulosin   Hypertension Soft blood pressure readings noted Hold Imdur, Coreg, and Lasix for now Hydralazine as needed for severe elevations    DVT prophylaxis:Lovenox Code Status: Full Family Communication: Wife at bedside  10/5 Disposition Plan:  Status is: Inpatient Remains inpatient appropriate because: Need for IV antibiotics.   Skin Assessment:  I have examined the patient's skin and I agree with the wound assessment as performed by the wound care RN as outlined below:  Pressure Injury 05/13/22 Foot Left;Posterior;Lower Unstageable - Full thickness tissue loss in which the base of the injury is covered by slough (yellow, tan, gray, green or brown) and/or eschar (tan, brown or black) in the wound bed. (Active)  05/13/22 1700  Location: Foot  Location Orientation: Left;Posterior;Lower  Staging: Unstageable - Full thickness tissue loss in which the base of the injury is covered by slough (yellow, tan, gray, green or brown) and/or eschar (tan, brown or black) in the wound bed.  Wound Description (Comments):   Present on Admission: Yes  Dressing Type Other (Comment) (betadine ,gauze, kerlix) 05/13/22 1957    Consultants:  Discussed with general surgery Discussed with Dr. Sharol Given who agrees to see by 10/9  Procedures:  None  Antimicrobials:  Anti-infectives (From admission, onward)    Start     Dose/Rate Route Frequency Ordered Stop   05/14/22 1200  cefTRIAXone (ROCEPHIN) 2 g in sodium chloride 0.9 % 100 mL IVPB        2 g 200 mL/hr over 30 Minutes Intravenous Every 24 hours 05/13/22 1412     05/14/22 0300  vancomycin (VANCOCIN) IVPB 1000 mg/200 mL premix        1,000 mg 200 mL/hr over 60 Minutes Intravenous Every 12 hours 05/13/22 1329     05/13/22 1230  vancomycin (VANCOREADY) IVPB 2000 mg/400 mL        2,000 mg 200 mL/hr  over 120 Minutes Intravenous  Once 05/13/22 1216 05/13/22 1551   05/13/22 1215  vancomycin (VANCOCIN) IVPB 1000 mg/200 mL premix  Status:  Discontinued        1,000 mg 200 mL/hr over 60 Minutes Intravenous  Once 05/13/22 1208 05/13/22 1216   05/13/22 1215  cefTRIAXone (ROCEPHIN) 2 g in sodium chloride 0.9 % 100 mL IVPB        2 g 200 mL/hr over 30 Minutes Intravenous  Once  05/13/22 1208 05/13/22 1325       Subjective: Patient seen and evaluated today with no new acute complaints or concerns. No acute concerns or events noted overnight.  Objective: Vitals:   05/13/22 1632 05/13/22 2114 05/14/22 0000 05/14/22 0529  BP: 127/81 126/69 110/68 116/68  Pulse: 76 82 72 74  Resp: '17 20 18 18  '$ Temp: 97.9 F (36.6 C) 98.1 F (36.7 C) 98 F (36.7 C) 98 F (36.7 C)  TempSrc: Oral Oral Oral   SpO2: 100% 99% 100% 99%  Weight:      Height:        Intake/Output Summary (Last 24 hours) at 05/14/2022 1223 Last data filed at 05/14/2022 0954 Gross per 24 hour  Intake 6593.49 ml  Output --  Net 6593.49 ml   Filed Weights   05/13/22 1144  Weight: 96.6 kg    Examination:  General exam: Appears calm and comfortable  Respiratory system: Clear to auscultation. Respiratory effort normal. Cardiovascular system: S1 & S2 heard, RRR.  Gastrointestinal system: Abdomen is soft Central nervous system: Alert and awake Extremities: Left foot as documented previously Skin: No significant lesions noted Psychiatry: Flat affect.    Data Reviewed: I have personally reviewed following labs and imaging studies  CBC: Recent Labs  Lab 05/13/22 1207 05/13/22 1425 05/14/22 0517  WBC 6.5 5.5 5.0  NEUTROABS 5.0  --   --   HGB 14.3 12.2* 13.5  HCT 40.9 34.9* 38.3*  MCV 82.6 83.5 84.2  PLT 221 172 350   Basic Metabolic Panel: Recent Labs  Lab 05/13/22 1207 05/13/22 1425 05/14/22 0517  NA 131*  --  139  K 4.6  --  3.6  CL 94*  --  105  CO2 26  --  26  GLUCOSE 469*  --  133*  BUN 13  --  10  CREATININE 1.15 1.08 0.92  CALCIUM 9.0  --  8.7*  MG  --   --  1.8   GFR: Estimated Creatinine Clearance: 102 mL/min (by C-G formula based on SCr of 0.92 mg/dL). Liver Function Tests: Recent Labs  Lab 05/13/22 1207  AST 14*  ALT 16  ALKPHOS 179*  BILITOT 1.9*  PROT 7.7  ALBUMIN 4.2   No results for input(s): "LIPASE", "AMYLASE" in the last 168 hours. No  results for input(s): "AMMONIA" in the last 168 hours. Coagulation Profile: Recent Labs  Lab 05/13/22 1207  INR 1.1   Cardiac Enzymes: No results for input(s): "CKTOTAL", "CKMB", "CKMBINDEX", "TROPONINI" in the last 168 hours. BNP (last 3 results) No results for input(s): "PROBNP" in the last 8760 hours. HbA1C: Recent Labs    05/13/22 1425  HGBA1C 9.7*   CBG: Recent Labs  Lab 05/13/22 1636 05/13/22 2116 05/14/22 0731 05/14/22 1105  GLUCAP 338* 203* 158* 168*   Lipid Profile: No results for input(s): "CHOL", "HDL", "LDLCALC", "TRIG", "CHOLHDL", "LDLDIRECT" in the last 72 hours. Thyroid Function Tests: No results for input(s): "TSH", "T4TOTAL", "FREET4", "T3FREE", "THYROIDAB" in the last 72 hours. Anemia Panel:  No results for input(s): "VITAMINB12", "FOLATE", "FERRITIN", "TIBC", "IRON", "RETICCTPCT" in the last 72 hours. Sepsis Labs: Recent Labs  Lab 05/13/22 1207 05/13/22 1415 05/14/22 0810  LATICACIDVEN 1.5 2.4* 1.1    Recent Results (from the past 240 hour(s))  Blood Culture (routine x 2)     Status: None (Preliminary result)   Collection Time: 05/13/22 12:07 PM   Specimen: BLOOD  Result Value Ref Range Status   Specimen Description BLOOD RIGHT ARM  Final   Special Requests   Final    BOTTLES DRAWN AEROBIC AND ANAEROBIC Blood Culture results may not be optimal due to an excessive volume of blood received in culture bottles   Culture   Final    NO GROWTH < 24 HOURS Performed at [facility], [address], [place], [place] [address]    Report Status PENDING  Incomplete  Blood Culture (routine x 2)     Status: None (Preliminary result)   Collection Time: 05/13/22 12:12 PM   Specimen: BLOOD  Result Value Ref Range Status   Specimen Description BLOOD LEFT ARM  Final   Special Requests   Final    BOTTLES DRAWN AEROBIC AND ANAEROBIC Blood Culture results may not be optimal due to an excessive volume of blood received in culture bottles   Culture   Final    NO  GROWTH < 24 HOURS Performed at [facility], [address], [place], [place] [address]    Report Status PENDING  Incomplete         Radiology Studies: MR FOOT LEFT W WO CONTRAST  Result Date: 05/13/2022 CLINICAL DATA:  Foot swelling. Nondiabetic. Osteomyelitis suspected. Lytic destruction involving the distal portion of fourth metatarsal and proximal base of the fourth proximal phalanx concerning for osteomyelitis. EXAM: MRI OF THE LEFT FOREFOOT WITHOUT AND WITH CONTRAST TECHNIQUE: Multiplanar, multisequence MR imaging of the left was performed both before and after administration of intravenous contrast. CONTRAST:  9 ml Vueway gadolinium IV COMPARISON:  Left foot radiographs 05/13/2022 and 10/06/2021 FINDINGS: Bones/Joint/Cartilage There is a plantar soft tissue ulcer and sinus tract (described below) extending to the fourth metatarsal head and metatarsophalangeal joint. There is moderate cortical erosion and fragmentation of the fourth metatarsal head, with multiple apparent fracture line lucencies (sagittal image 22) and approximately 3 mm dorsal cortical step-off of the distal aspect of the fourth metatarsal head with respect to the fourth metatarsal neck. There is also fragmentation of the proximal phalanx of the great toe. Moderate to high-grade fourth metatarsophalangeal joint effusion. There is high-grade marrow edema throughout the entire proximal phalanx of the fourth toe and the distal aspect of the fourth metatarsal extending all the way proximally to the proximal metaphysis of the fourth metatarsal (sagittal image 22). Possible marrow edema within portions of the fourth toe middle and distal phalanges, however evaluation is limited by patient motion artifact and the small size of these joints. There also associated degenerative changes of the interphalangeal joints there may be a cause for subchondral marrow edema. There is marrow edema within the proximal 40% of the proximal phalanx of the  fifth digit with proximal cortical erosion concerning for osteomyelitis. Small fifth metatarsophalangeal joint effusion. Mild marrow edema within the plantar aspect of the fifth metatarsal head. No definitive cortical destruction within the fifth metatarsal head, and this could be degenerative edema, however osteomyelitis cannot be excluded. There is curvilinear fluid bright signal across a nonunited chronic appearing fracture of the proximal metaphysis of the second metatarsal, also seen on  prior radiographs with high-grade surrounding callus formation. 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 Subchondral cystic change and surrounding marrow edema within the lateral cuneiform at the third tarsometatarsal joint. Ligaments There is metallic artifact related to the previously seen surgical staple fixating the first tarsometatarsal joint. There is scattered metallic artifact across the medial cuneiform-base of the second metatarsal Lisfranc ligament complex. Muscles and Tendons The visualized second extensor tendons appear intact. There is diffuse fatty infiltration of the visualized midfoot and forefoot musculature. Soft tissues There is an ulceration within the forefoot soft tissues plantar to the fourth metatarsal head and metatarsophalangeal joint, measuring up to 13 mm in transverse dimension and 15 mm in AP dimension (series 10, image 20 and series 9, image 22). There is a sinus tract extending from this region directly to the distal fourth metatarsal head and metatarsophalangeal joint. Moderate edema and swelling of the dorsal midfoot subcutaneous fat. IMPRESSION: 1. There is a plantar soft tissue ulcer at the level of the fourth metatarsal head and metatarsophalangeal joint with sinus tract extending to the fourth metatarsal head and metatarsophalangeal joint. There is cortical destruction and marrow edema indicating acute osteomyelitis of the distal fourth metatarsal head and the  4th proximal phalanx. The metatarsal edema extends all the way proximally to the proximal metaphysis. There is also a moderate to large fourth metatarsophalangeal joint effusion consistent with septic arthritis. 2. Additional marrow edema and probable cortical erosion within the proximal fifth metatarsal also concerning for osteomyelitis. Tiny fifth metatarsophalangeal joint effusion. Mild marrow edema within the plantar aspect of the fifth metatarsal head may be secondary to degenerative changes, however it is difficult to exclude early minimal osteomyelitis. 3. Chronic nonunited fracture of the proximal second metatarsal. Electronically Signed   By: Yvonne Kendall M.D.   On: 05/13/2022 19:10   US ARTERIAL ABI (SCREENING LOWER EXTREMITY)  Result Date: 05/13/2022 CLINICAL DATA:  Peripheral vascular disease EXAM: NONINVASIVE PHYSIOLOGIC VASCULAR STUDY OF BILATERAL LOWER EXTREMITIES TECHNIQUE: Evaluation of both lower extremities were performed at rest, including calculation of ankle-brachial indices with single level Doppler, pressure and pulse volume recording. COMPARISON:  06/14/2019 FINDINGS: Right ABI:  1.27 Left ABI:  1.24 Right Lower Extremity: Multiphasic arterial waveform in the posterior tibial artery. Monophasic in dorsalis pedis. Left Lower Extremity: Multiphasic posterior tibial waveform. Monophasic dorsalis pedis. IMPRESSION: No evidence of hemodynamically significant lower extremity arterial occlusive disease at rest. Electronically Signed   By: Lucrezia Europe M.D.   On: 05/13/2022 14:49   DG Foot Complete Left  Result Date: 05/13/2022 CLINICAL DATA:  Left foot wound. EXAM: LEFT FOOT - COMPLETE 3+ VIEW COMPARISON:  October 06, 2021. FINDINGS: Status post surgical fusion of the first tarsometatarsal joint. There appears to be lytic destruction involving the distal portion of the fourth metatarsal and the proximal base of the fourth proximal phalanx concerning for osteomyelitis. Mild posterior  calcaneal spurring is noted. IMPRESSION: Lytic destruction involving distal portion of fourth metatarsal and proximal base of fourth proximal phalanx concerning for osteomyelitis. Electronically Signed   By: Marijo Conception M.D.   On: 05/13/2022 12:49   DG Chest Port 1 View  Result Date: 05/13/2022 CLINICAL DATA:  Sepsis. EXAM: PORTABLE CHEST 1 VIEW COMPARISON:  Dec 29, 2017. FINDINGS: The heart size and mediastinal contours are within normal limits. Both lungs are clear. The visualized skeletal structures are unremarkable. IMPRESSION: No active disease. Electronically Signed   By: Marijo Conception M.D.   On: 05/13/2022 12:47  Scheduled Meds:  amitriptyline  50 mg Oral QHS   aspirin EC  81 mg Oral Daily   atorvastatin  20 mg Oral q1800   calcium carbonate  500 mg of elemental calcium Oral Q breakfast   cyanocobalamin  1,000 mcg Oral Daily   enoxaparin (LOVENOX) injection  40 mg Subcutaneous Q24H   famotidine  20 mg Oral QHS   gabapentin  300 mg Oral TID   insulin aspart  0-20 Units Subcutaneous TID WC   insulin aspart  0-5 Units Subcutaneous QHS   insulin aspart protamine- aspart  50 Units Subcutaneous BID WC   living well with diabetes book   Does not apply Once   LORazepam  1 mg Oral QHS   pantoprazole  40 mg Oral BID AC   sodium hypochlorite   Topical BID   tamsulosin  0.4 mg Oral Daily   Continuous Infusions:  cefTRIAXone (ROCEPHIN)  IV 2 g (05/14/22 1220)   vancomycin Stopped (05/14/22 0346)     LOS: 1 day    Time spent: 35 minutes    Khary Schaben Darleen Crocker, DO [facility]  If 7PM-7AM, please contact night-coverage [email or web address] 05/14/2022, 12:23 PM

## 2022-05-15 DIAGNOSIS — M869 Osteomyelitis, unspecified: Secondary | ICD-10-CM | POA: Diagnosis not present

## 2022-05-15 LAB — CBC
HCT: 43.2 % (ref 39.0–52.0)
Hemoglobin: 14.6 g/dL (ref 13.0–17.0)
MCH: 29.3 pg (ref 26.0–34.0)
MCHC: 33.8 g/dL (ref 30.0–36.0)
MCV: 86.6 fL (ref 80.0–100.0)
Platelets: 200 10*3/uL (ref 150–400)
RBC: 4.99 MIL/uL (ref 4.22–5.81)
RDW: 14.1 % (ref 11.5–15.5)
WBC: 5.6 10*3/uL (ref 4.0–10.5)
nRBC: 0 % (ref 0.0–0.2)

## 2022-05-15 LAB — BASIC METABOLIC PANEL
Anion gap: 11 (ref 5–15)
BUN: 7 mg/dL — ABNORMAL LOW (ref 8–23)
CO2: 26 mmol/L (ref 22–32)
Calcium: 8.7 mg/dL — ABNORMAL LOW (ref 8.9–10.3)
Chloride: 103 mmol/L (ref 98–111)
Creatinine, Ser: 0.89 mg/dL (ref 0.61–1.24)
GFR, Estimated: >60 mL/min (ref >60)
Glucose, Bld: 98 mg/dL (ref 70–99)
Potassium: 3.1 mmol/L — ABNORMAL LOW (ref 3.5–5.1)
Sodium: 140 mmol/L (ref 135–145)

## 2022-05-15 LAB — GLUCOSE, CAPILLARY
Glucose-Capillary: 108 mg/dL — ABNORMAL HIGH (ref 70–99)
Glucose-Capillary: 110 mg/dL — ABNORMAL HIGH (ref 70–99)
Glucose-Capillary: 116 mg/dL — ABNORMAL HIGH (ref 70–99)
Glucose-Capillary: 207 mg/dL — ABNORMAL HIGH (ref 70–99)
Glucose-Capillary: 181 mg/dL — ABNORMAL HIGH (ref 70–99)

## 2022-05-15 LAB — MAGNESIUM: Magnesium: 2.1 mg/dL (ref 1.7–2.4)

## 2022-05-15 MED ORDER — POTASSIUM CHLORIDE CRYS ER 20 MEQ PO TBCR
40.0000 meq | EXTENDED_RELEASE_TABLET | Freq: Two times a day (BID) | ORAL | Status: AC
  Administered 2022-05-15 (×2): 40 meq via ORAL
  Filled 2022-05-15 (×2): qty 2

## 2022-05-15 NOTE — Progress Notes (Signed)
Called report. [facility] called by receptionist.

## 2022-05-15 NOTE — Progress Notes (Signed)
[facility] called for patient transport 

## 2022-05-15 NOTE — Progress Notes (Signed)
Attempted to call 2w 16 no answer.

## 2022-05-15 NOTE — Plan of Care (Signed)
  Problem: Coping: Goal: Ability to adjust to condition or change in health will improve Outcome: Progressing   Problem: Metabolic: Goal: Ability to maintain appropriate glucose levels will improve Outcome: Progressing   Problem: Nutritional: Goal: Maintenance of adequate nutrition will improve Outcome: Progressing   

## 2022-05-15 NOTE — Progress Notes (Signed)
PROGRESS NOTE    Mike Floyd  VFI:[id number] DOB: 07/31/1961 DOA: 05/13/2022 PCP: Redmond School, MD   Brief Narrative:    Mike Floyd is a 61 y.o. male with medical history significant for poorly controlled type 2 diabetes, GERD, hypertension, dyslipidemia, CAD, and neuropathy who presented to his PCP office with worsening pain, swelling, and redness to his left foot.  He was redirected here for admission for IV antibiotics due to concern for underlying osteomyelitis.  He is indeed noted to have osteomyelitis to his fourth and fifth metatarsal on MRI.  General surgery has recommended consultation with Dr. Sharol Given who agrees to see patient for consideration of amputation by 10/9.  He continues to remain on IV antibiotics for now. Plan to transfer to [facility] for further evaluation per Dr. Sharol Given.  Assessment & Plan:   Principal Problem:   Osteomyelitis ([place]) Active Problems:   DM type 2 causing vascular disease (HCC)   GERD (gastroesophageal reflux disease)   AICD (automatic cardioverter/defibrillator)   HTN (hypertension)   Chronic kidney disease (CKD), stage III (moderate) (HCC)   Mixed hyperlipidemia   CAD (coronary artery disease)  Assessment and Plan:   Left foot osteomyelitis in patient with diabetic foot ulcer Continue on IV vancomycin and Rocephin Blood cultures with no growth noted Dr. Sharol Given to see patient for likely amputation by 10/9   Hyperglycemia in the setting of poorly controlled type 2 diabetes and infection Maintain on aggressive SSI and long-acting insulin Carb modified diet Hemoglobin A1c 9.7% Diabetes coordinator appreciated  Mild hypokalemia Replete and re-evaluate in am   CAD/dyslipidemia Continue aspirin and statin   Diabetic neuropathy Continue home medications of Elavil and gabapentin   GERD Continue PPI and Carafate   BPH Continue tamsulosin   Hypertension Soft blood pressure readings noted Hold Imdur, Coreg, and Lasix for now Hydralazine  as needed for severe elevations     DVT prophylaxis:Lovenox Code Status: Full Family Communication: Wife at bedside 10/5 Disposition Plan:  Status is: Inpatient Remains inpatient appropriate because: Need for IV antibiotics.     Skin Assessment:   I have examined the patient's skin and I agree with the wound assessment as performed by the wound care RN as outlined below:       Pressure Injury 05/13/22 Foot Left;Posterior;Lower Unstageable - Full thickness tissue loss in which the base of the injury is covered by slough (yellow, tan, gray, green or brown) and/or eschar (tan, brown or black) in the wound bed. (Active)  05/13/22 1700  Location: Foot  Location Orientation: Left;Posterior;Lower  Staging: Unstageable - Full thickness tissue loss in which the base of the injury is covered by slough (yellow, tan, gray, green or brown) and/or eschar (tan, brown or black) in the wound bed.  Wound Description (Comments):   Present on Admission: Yes  Dressing Type Other (Comment) (betadine ,gauze, kerlix) 05/13/22 1957      Consultants:  Discussed with general surgery Discussed with Dr. Sharol Given who agrees to see by 10/9   Procedures:  None   Antimicrobials:  Anti-infectives (From admission, onward)    Start     Dose/Rate Route Frequency Ordered Stop   05/14/22 1200  cefTRIAXone (ROCEPHIN) 2 g in sodium chloride 0.9 % 100 mL IVPB        2 g 200 mL/hr over 30 Minutes Intravenous Every 24 hours 05/13/22 1412     05/14/22 0300  vancomycin (VANCOCIN) IVPB 1000 mg/200 mL premix        1,000 mg  200 mL/hr over 60 Minutes Intravenous Every 12 hours 05/13/22 1329     05/13/22 1230  vancomycin (VANCOREADY) IVPB 2000 mg/400 mL        2,000 mg 200 mL/hr over 120 Minutes Intravenous  Once 05/13/22 1216 05/13/22 1551   05/13/22 1215  vancomycin (VANCOCIN) IVPB 1000 mg/200 mL premix  Status:  Discontinued        1,000 mg 200 mL/hr over 60 Minutes Intravenous  Once 05/13/22 1208 05/13/22 1216    05/13/22 1215  cefTRIAXone (ROCEPHIN) 2 g in sodium chloride 0.9 % 100 mL IVPB        2 g 200 mL/hr over 30 Minutes Intravenous  Once 05/13/22 1208 05/13/22 1325       Subjective: Patient seen and evaluated today with no new acute complaints or concerns. No acute concerns or events noted overnight.  Objective: Vitals:   05/14/22 0529 05/14/22 1326 05/14/22 2112 05/15/22 0602  BP: 116/68 123/80 111/75 112/79  Pulse: 74 99 87 92  Resp: '18 20 20 18  '$ Temp: 98 F (36.7 C) 98.3 F (36.8 C) 98 F (36.7 C) 97.6 F (36.4 C)  TempSrc:  Oral Oral Oral  SpO2: 99% 99% 100% 100%  Weight:      Height:        Intake/Output Summary (Last 24 hours) at 05/15/2022 1327 Last data filed at 05/15/2022 0327 Gross per 24 hour  Intake 783.91 ml  Output --  Net 783.91 ml   Filed Weights   05/13/22 1144  Weight: 96.6 kg    Examination:  General exam: Appears calm and comfortable  Respiratory system: Clear to auscultation. Respiratory effort normal. Cardiovascular system: S1 & S2 heard, RRR.  Gastrointestinal system: Abdomen is soft Central nervous system: Alert and awake Extremities: No edema; left foot dressings c/d/i Skin: No significant lesions noted Psychiatry: Flat affect.    Data Reviewed: I have personally reviewed following labs and imaging studies  CBC: Recent Labs  Lab 05/13/22 1207 05/13/22 1425 05/14/22 0517 05/15/22 0630  WBC 6.5 5.5 5.0 5.6  NEUTROABS 5.0  --   --   --   HGB 14.3 12.2* 13.5 14.6  HCT 40.9 34.9* 38.3* 43.2  MCV 82.6 83.5 84.2 86.6  PLT 221 172 191 245   Basic Metabolic Panel: Recent Labs  Lab 05/13/22 1207 05/13/22 1425 05/14/22 0517 05/15/22 0630  NA 131*  --  139 140  K 4.6  --  3.6 3.1*  CL 94*  --  105 103  CO2 26  --  26 26  GLUCOSE 469*  --  133* 98  BUN 13  --  10 7*  CREATININE 1.15 1.08 0.92 0.89  CALCIUM 9.0  --  8.7* 8.7*  MG  --   --  1.8 2.1   GFR: Estimated Creatinine Clearance: 105.4 mL/min (by C-G formula based on  SCr of 0.89 mg/dL). Liver Function Tests: Recent Labs  Lab 05/13/22 1207  AST 14*  ALT 16  ALKPHOS 179*  BILITOT 1.9*  PROT 7.7  ALBUMIN 4.2   No results for input(s): "LIPASE", "AMYLASE" in the last 168 hours. No results for input(s): "AMMONIA" in the last 168 hours. Coagulation Profile: Recent Labs  Lab 05/13/22 1207  INR 1.1   Cardiac Enzymes: No results for input(s): "CKTOTAL", "CKMB", "CKMBINDEX", "TROPONINI" in the last 168 hours. BNP (last 3 results) No results for input(s): "PROBNP" in the last 8760 hours. HbA1C: Recent Labs    05/13/22 1425  HGBA1C 9.7*  CBG: Recent Labs  Lab 05/14/22 0731 05/14/22 1105 05/14/22 1702 05/15/22 0725 05/15/22 1148  GLUCAP 158* 168* 215* 110* 116*   Lipid Profile: No results for input(s): "CHOL", "HDL", "LDLCALC", "TRIG", "CHOLHDL", "LDLDIRECT" in the last 72 hours. Thyroid Function Tests: No results for input(s): "TSH", "T4TOTAL", "FREET4", "T3FREE", "THYROIDAB" in the last 72 hours. Anemia Panel: No results for input(s): "VITAMINB12", "FOLATE", "FERRITIN", "TIBC", "IRON", "RETICCTPCT" in the last 72 hours. Sepsis Labs: Recent Labs  Lab 05/13/22 1207 05/13/22 1415 05/14/22 0810  LATICACIDVEN 1.5 2.4* 1.1    Recent Results (from the past 240 hour(s))  Blood Culture (routine x 2)     Status: None (Preliminary result)   Collection Time: 05/13/22 12:07 PM   Specimen: BLOOD  Result Value Ref Range Status   Specimen Description BLOOD RIGHT ARM  Final   Special Requests   Final    BOTTLES DRAWN AEROBIC AND ANAEROBIC Blood Culture results may not be optimal due to an excessive volume of blood received in culture bottles   Culture   Final    NO GROWTH 2 DAYS Performed at [facility], [address] [place], [place] [address]    Report Status PENDING  Incomplete  Blood Culture (routine x 2)     Status: None (Preliminary result)   Collection Time: 05/13/22 12:12 PM   Specimen: BLOOD  Result Value Ref Range Status    Specimen Description BLOOD LEFT ARM  Final   Special Requests   Final    BOTTLES DRAWN AEROBIC AND ANAEROBIC Blood Culture results may not be optimal due to an excessive volume of blood received in culture bottles   Culture   Final    NO GROWTH 2 DAYS Performed at [facility], [address], [place], [place] [address]    Report Status PENDING  Incomplete         Radiology Studies: MR FOOT LEFT W WO CONTRAST  Result Date: 05/13/2022 CLINICAL DATA:  Foot swelling. Nondiabetic. Osteomyelitis suspected. Lytic destruction involving the distal portion of fourth metatarsal and proximal base of the fourth proximal phalanx concerning for osteomyelitis. EXAM: MRI OF THE LEFT FOREFOOT WITHOUT AND WITH CONTRAST TECHNIQUE: Multiplanar, multisequence MR imaging of the left was performed both before and after administration of intravenous contrast. CONTRAST:  9 ml Vueway gadolinium IV COMPARISON:  Left foot radiographs 05/13/2022 and 10/06/2021 FINDINGS: Bones/Joint/Cartilage There is a plantar soft tissue ulcer and sinus tract (described below) extending to the fourth metatarsal head and metatarsophalangeal joint. There is moderate cortical erosion and fragmentation of the fourth metatarsal head, with multiple apparent fracture line lucencies (sagittal image 22) and approximately 3 mm dorsal cortical step-off of the distal aspect of the fourth metatarsal head with respect to the fourth metatarsal neck. There is also fragmentation of the proximal phalanx of the great toe. Moderate to high-grade fourth metatarsophalangeal joint effusion. There is high-grade marrow edema throughout the entire proximal phalanx of the fourth toe and the distal aspect of the fourth metatarsal extending all the way proximally to the proximal metaphysis of the fourth metatarsal (sagittal image 22). Possible marrow edema within portions of the fourth toe middle and distal phalanges, however evaluation is limited by patient motion  artifact and the small size of these joints. There also associated degenerative changes of the interphalangeal joints there may be a cause for subchondral marrow edema. There is marrow edema within the proximal 40% of the proximal phalanx of the fifth digit with proximal cortical erosion concerning for osteomyelitis. Small fifth  metatarsophalangeal joint effusion. Mild marrow edema within the plantar aspect of the fifth metatarsal head. No definitive cortical destruction within the fifth metatarsal head, and this could be degenerative edema, however osteomyelitis cannot be excluded. There is curvilinear fluid bright signal across a nonunited chronic appearing fracture of the proximal metaphysis of the second metatarsal, also seen on prior radiographs with high-grade surrounding callus formation. 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 Subchondral cystic change and surrounding marrow edema within the lateral cuneiform at the third tarsometatarsal joint. Ligaments There is metallic artifact related to the previously seen surgical staple fixating the first tarsometatarsal joint. There is scattered metallic artifact across the medial cuneiform-base of the second metatarsal Lisfranc ligament complex. Muscles and Tendons The visualized second extensor tendons appear intact. There is diffuse fatty infiltration of the visualized midfoot and forefoot musculature. Soft tissues There is an ulceration within the forefoot soft tissues plantar to the fourth metatarsal head and metatarsophalangeal joint, measuring up to 13 mm in transverse dimension and 15 mm in AP dimension (series 10, image 20 and series 9, image 22). There is a sinus tract extending from this region directly to the distal fourth metatarsal head and metatarsophalangeal joint. Moderate edema and swelling of the dorsal midfoot subcutaneous fat. IMPRESSION: 1. There is a plantar soft tissue ulcer at the level of the fourth metatarsal  head and metatarsophalangeal joint with sinus tract extending to the fourth metatarsal head and metatarsophalangeal joint. There is cortical destruction and marrow edema indicating acute osteomyelitis of the distal fourth metatarsal head and the 4th proximal phalanx. The metatarsal edema extends all the way proximally to the proximal metaphysis. There is also a moderate to large fourth metatarsophalangeal joint effusion consistent with septic arthritis. 2. Additional marrow edema and probable cortical erosion within the proximal fifth metatarsal also concerning for osteomyelitis. Tiny fifth metatarsophalangeal joint effusion. Mild marrow edema within the plantar aspect of the fifth metatarsal head may be secondary to degenerative changes, however it is difficult to exclude early minimal osteomyelitis. 3. Chronic nonunited fracture of the proximal second metatarsal. Electronically Signed   By: Yvonne Kendall M.D.   On: 05/13/2022 19:10   US ARTERIAL ABI (SCREENING LOWER EXTREMITY)  Result Date: 05/13/2022 CLINICAL DATA:  Peripheral vascular disease EXAM: NONINVASIVE PHYSIOLOGIC VASCULAR STUDY OF BILATERAL LOWER EXTREMITIES TECHNIQUE: Evaluation of both lower extremities were performed at rest, including calculation of ankle-brachial indices with single level Doppler, pressure and pulse volume recording. COMPARISON:  06/14/2019 FINDINGS: Right ABI:  1.27 Left ABI:  1.24 Right Lower Extremity: Multiphasic arterial waveform in the posterior tibial artery. Monophasic in dorsalis pedis. Left Lower Extremity: Multiphasic posterior tibial waveform. Monophasic dorsalis pedis. IMPRESSION: No evidence of hemodynamically significant lower extremity arterial occlusive disease at rest. Electronically Signed   By: Lucrezia Europe M.D.   On: 05/13/2022 14:49        Scheduled Meds:  amitriptyline  50 mg Oral QHS   aspirin EC  81 mg Oral Daily   atorvastatin  20 mg Oral q1800   calcium carbonate  500 mg of elemental calcium  Oral Q breakfast   cyanocobalamin  1,000 mcg Oral Daily   enoxaparin (LOVENOX) injection  40 mg Subcutaneous Q24H   famotidine  20 mg Oral QHS   gabapentin  300 mg Oral TID   insulin aspart  0-20 Units Subcutaneous TID WC   insulin aspart  0-5 Units Subcutaneous QHS   insulin aspart protamine- aspart  50 Units Subcutaneous BID WC   LORazepam  1 mg Oral QHS   pantoprazole  40 mg Oral BID AC   potassium chloride  40 mEq Oral BID   sodium hypochlorite   Topical BID   tamsulosin  0.4 mg Oral Daily   Continuous Infusions:  cefTRIAXone (ROCEPHIN)  IV 2 g (05/15/22 1144)   vancomycin 1,000 mg (05/15/22 0333)     LOS: 2 days    Time spent: 35 minutes    Hussein Macdougal D Manuella Ghazi, DO [facility]  If 7PM-7AM, please contact night-coverage [email or web address] 05/15/2022, 1:27 PM

## 2022-05-16 ENCOUNTER — Inpatient Hospital Stay (HOSPITAL_COMMUNITY): Payer: Medicare Other

## 2022-05-16 DIAGNOSIS — I2510 Atherosclerotic heart disease of native coronary artery without angina pectoris: Secondary | ICD-10-CM

## 2022-05-16 DIAGNOSIS — I10 Essential (primary) hypertension: Secondary | ICD-10-CM | POA: Diagnosis not present

## 2022-05-16 DIAGNOSIS — I509 Heart failure, unspecified: Secondary | ICD-10-CM

## 2022-05-16 DIAGNOSIS — E1159 Type 2 diabetes mellitus with other circulatory complications: Secondary | ICD-10-CM | POA: Diagnosis not present

## 2022-05-16 DIAGNOSIS — M869 Osteomyelitis, unspecified: Secondary | ICD-10-CM | POA: Diagnosis not present

## 2022-05-16 DIAGNOSIS — I2583 Coronary atherosclerosis due to lipid rich plaque: Secondary | ICD-10-CM

## 2022-05-16 LAB — BASIC METABOLIC PANEL
Anion gap: 6 (ref 5–15)
BUN: <5 mg/dL — ABNORMAL LOW (ref 8–23)
CO2: 25 mmol/L (ref 22–32)
Calcium: 9.2 mg/dL (ref 8.9–10.3)
Chloride: 108 mmol/L (ref 98–111)
Creatinine, Ser: 0.93 mg/dL (ref 0.61–1.24)
GFR, Estimated: >60 mL/min (ref >60)
Glucose, Bld: 79 mg/dL (ref 70–99)
Potassium: 3.7 mmol/L (ref 3.5–5.1)
Sodium: 139 mmol/L (ref 135–145)

## 2022-05-16 LAB — ECHOCARDIOGRAM COMPLETE
Area-P 1/2: 3.37 cm2
Calc EF: 45.9 %
Height: 73 in
S' Lateral: 2.7 cm
Single Plane A2C EF: 46.5 %
Single Plane A4C EF: 48.9 %
Weight: 3408 oz

## 2022-05-16 LAB — PROCALCITONIN: Procalcitonin: <0.1 ng/mL

## 2022-05-16 LAB — CBC
HCT: 42.2 % (ref 39.0–52.0)
Hemoglobin: 14.4 g/dL (ref 13.0–17.0)
MCH: 29.1 pg (ref 26.0–34.0)
MCHC: 34.1 g/dL (ref 30.0–36.0)
MCV: 85.4 fL (ref 80.0–100.0)
Platelets: 177 10*3/uL (ref 150–400)
RBC: 4.94 MIL/uL (ref 4.22–5.81)
RDW: 13.8 % (ref 11.5–15.5)
WBC: 5.5 10*3/uL (ref 4.0–10.5)
nRBC: 0 % (ref 0.0–0.2)

## 2022-05-16 LAB — TYPE AND SCREEN
ABO/RH(D): O POS
Antibody Screen: NEGATIVE

## 2022-05-16 LAB — GLUCOSE, CAPILLARY
Glucose-Capillary: 96 mg/dL (ref 70–99)
Glucose-Capillary: 156 mg/dL — ABNORMAL HIGH (ref 70–99)
Glucose-Capillary: 186 mg/dL — ABNORMAL HIGH (ref 70–99)
Glucose-Capillary: 182 mg/dL — ABNORMAL HIGH (ref 70–99)

## 2022-05-16 LAB — PROTIME-INR
INR: 1.2 (ref 0.8–1.2)
Prothrombin Time: 15.3 seconds — ABNORMAL HIGH (ref 11.4–15.2)

## 2022-05-16 LAB — MAGNESIUM: Magnesium: 1.9 mg/dL (ref 1.7–2.4)

## 2022-05-16 LAB — VANCOMYCIN, PEAK: Vancomycin Pk: 36 ug/mL (ref 30–40)

## 2022-05-16 LAB — C-REACTIVE PROTEIN: CRP: 0.6 mg/dL (ref <1.0)

## 2022-05-16 MED ORDER — PERFLUTREN LIPID MICROSPHERE
1.0000 mL | INTRAVENOUS | Status: AC | PRN
  Administered 2022-05-16: 3 mL via INTRAVENOUS

## 2022-05-16 MED ORDER — CARVEDILOL 3.125 MG PO TABS
3.1250 mg | ORAL_TABLET | Freq: Two times a day (BID) | ORAL | Status: DC
  Administered 2022-05-16 – 2022-05-21 (×9): 3.125 mg via ORAL
  Filled 2022-05-16 (×10): qty 1

## 2022-05-16 NOTE — Evaluation (Signed)
Physical Therapy Evaluation Patient Details Name: Mike Floyd MRN: [id number] DOB: 1961/06/04 Today's Date: 05/16/2022  History of Present Illness  Pt is a 61 y.o. male admitted 10/5 with L foot  fourth and fifth metatarsal osteomyelitis. He presented from his PCP office with worsening pain, swelling, and redness to his left foot. Admitted to [facility] 10/5 and transferred to [facility] 10/7. PMH:  poorly controlled type 2 diabetes, GERD, hypertension, dyslipidemia, CAD, and neuropathy   Clinical Impression  Pt admitted with above diagnosis. PTA pt lived at home with wife, mod I with cane. Pt currently with functional limitations due to the deficits listed below (see PT Problem List). On eval, he required supervision transfers, and supervision amb 125' with RW. Distance limited by L foot pain and fatitgue. Pt will benefit from skilled PT to increase their independence and safety with mobility to allow discharge to the venue listed below.  Pt is tentatively schedule for L foot partial amp 10/11.         Recommendations for follow up therapy are one component of a multi-disciplinary discharge planning process, led by the attending physician.  Recommendations may be updated based on patient status, additional functional criteria and insurance authorization.  Follow Up Recommendations No PT follow up      Assistance Recommended at Discharge PRN  Patient can return home with the following  Assist for transportation;Help with stairs or ramp for entrance;Assistance with cooking/housework    Equipment Recommendations Rolling walker (2 wheels)  Recommendations for Other Services       Functional Status Assessment Patient has had a recent decline in their functional status and demonstrates the ability to make significant improvements in function in a reasonable and predictable amount of time.     Precautions / Restrictions Precautions Precautions: Fall Restrictions Weight Bearing Restrictions: Yes LLE  Weight Bearing: Partial weight bearing Other Position/Activity Restrictions: "Partial weight training on the affected L. foot, please use a cane or a walker for now." - per Dr Candiss Norse 10/8      Mobility  Bed Mobility Overal bed mobility: Modified Independent                  Transfers Overall transfer level: Needs assistance Equipment used: Rolling walker (2 wheels) Transfers: Sit to/from Stand Sit to Stand: Supervision                Ambulation/Gait Ambulation/Gait assistance: Supervision Gait Distance (Feet): 125 Feet Assistive device: Rolling walker (2 wheels) Gait Pattern/deviations: Step-to pattern, Decreased stride length, Antalgic Gait velocity: decreased Gait velocity interpretation: 1.31 - 2.62 ft/sec, indicative of limited community ambulator   General Gait Details: Pt WB through medial forefoot to manage pain. Fatigue noted upon return to room.  Stairs            Wheelchair Mobility    Modified Rankin (Stroke Patients Only)       Balance Overall balance assessment: Needs assistance Sitting-balance support: Feet supported, No upper extremity supported Sitting balance-Leahy Scale: Good     Standing balance support: During functional activity, No upper extremity supported, Bilateral upper extremity supported Standing balance-Leahy Scale: Fair Standing balance comment: RW for amb due to pain L foot                             Pertinent Vitals/Pain Pain Assessment Pain Assessment: Faces Faces Pain Scale: Hurts even more Pain Location: L foot with mobility Pain Descriptors / Indicators: Discomfort Pain  Intervention(s): Monitored during session, Limited activity within patient's tolerance, Repositioned    Home Living Family/patient expects to be discharged to:: Private residence Living Arrangements: Spouse/significant other Available Help at Discharge: Family;Available 24 hours/day Type of Home: Mobile home Home Access:  Stairs to enter Entrance Stairs-Rails: Right Entrance Stairs-Number of Steps: 4   Home Layout: One level Home Equipment: Cane - single point      Prior Function Prior Level of Function : Independent/Modified Independent;Driving             Mobility Comments: amb with cane ADLs Comments: ind     Hand Dominance   Dominant Hand: Right    Extremity/Trunk Assessment   Upper Extremity Assessment Upper Extremity Assessment: Overall WFL for tasks assessed    Lower Extremity Assessment Lower Extremity Assessment: LLE deficits/detail LLE Deficits / Details: open wounds L foot, osteomyelitis 4th and 5th metatarsal    Cervical / Trunk Assessment Cervical / Trunk Assessment: Normal  Communication   Communication: HOH  Cognition Arousal/Alertness: Awake/alert Behavior During Therapy: WFL for tasks assessed/performed Overall Cognitive Status: Within Functional Limits for tasks assessed                                          General Comments General comments (skin integrity, edema, etc.): VSS on RA    Exercises     Assessment/Plan    PT Assessment Patient needs continued PT services  PT Problem List Decreased mobility;Decreased knowledge of precautions;Decreased activity tolerance;Pain;Decreased balance;Decreased knowledge of use of DME       PT Treatment Interventions Functional mobility training;Balance training;Stair training;Therapeutic exercise;Gait training;Therapeutic activities;DME instruction;Patient/family education    PT Goals (Current goals can be found in the Care Plan section)  Acute Rehab PT Goals Patient Stated Goal: home PT Goal Formulation: With patient Time For Goal Achievement: 05/30/22 Potential to Achieve Goals: Good    Frequency Min 3X/week     Co-evaluation               AM-PAC PT "6 Clicks" Mobility  Outcome Measure Help needed turning from your back to your side while in a flat bed without using bedrails?:  None Help needed moving from lying on your back to sitting on the side of a flat bed without using bedrails?: None Help needed moving to and from a bed to a chair (including a wheelchair)?: A Little Help needed standing up from a chair using your arms (e.g., wheelchair or bedside chair)?: A Little Help needed to walk in hospital room?: A Little Help needed climbing 3-5 steps with a railing? : A Little 6 Click Score: 20    End of Session Equipment Utilized During Treatment: Gait belt Activity Tolerance: Patient tolerated treatment well Patient left: in bed;with call bell/phone within reach Nurse Communication: Mobility status PT Visit Diagnosis: Pain;Difficulty in walking, not elsewhere classified (R26.2) Pain - Right/Left: Left Pain - part of body: Ankle and joints of foot    Time: 5621-3086 PT Time Calculation (min) (ACUTE ONLY): 12 min   Charges:   PT Evaluation $PT Eval Moderate Complexity: 1 Mod          Lorrin Goodell, PT  Office # [phone] Pager [phone]   Lorriane Shire 05/16/2022, 2:43 PM

## 2022-05-16 NOTE — Progress Notes (Signed)
PROGRESS NOTE                                                                                                                                                                                                             Patient Demographics:    Mike Floyd, is a 61 y.o. male, DOB - [birth year], OQH:[id number]  Outpatient Primary MD for the patient is Redmond School, MD    LOS - 3  Admit date - 05/13/2022    Chief Complaint  Patient presents with   Wound Check       Brief Narrative (HPI from H&P)  - 61 y.o. male with medical history significant for poorly controlled type 2 diabetes, GERD, hypertension, dyslipidemia, CAD, and neuropathy who presented to his PCP office with worsening pain, swelling, and redness to his left foot.  He was redirected here for admission for IV antibiotics due to concern for underlying osteomyelitis.  He is indeed noted to have osteomyelitis to his fourth and fifth metatarsal on MRI.  He was placed on IV antibiotics and transferred from [facility] for further care at [facility] under the Dr. Sharol Given.   Subjective:    Mike Floyd today has, No headache, No chest pain, No abdominal pain - No Nausea, No new weakness tingling or numbness, no SOB.   Assessment  & Plan :   Left foot acute osteomyelitis of the distal fourth metatarsal head and the 4th proximal phalanx in patient with diabetic foot ulcer -  Continue on IV vancomycin and Rocephin, follow blood cultures, discussed the case with Dr. Sharol Given who will see the patient likely operate on 05/19/2022.  ABI of the left foot stable.  CAD/dyslipidemia - Continue Coreg, aspirin and statin, chest pain-free with nonacute EKG, he says he had some heart issues in the past and required a defibrillator placement which has since been removed, obtain echocardiogram as last echo in the file is 61 years old.   Diabetic neuropathy -  Continue home medications of  Elavil and gabapentin.  GERD - Continue PPI and Carafate   BPH - Continue tamsulosin   Hypertension - for now continue Coreg, Imdur and Lasix held as blood pressure borderline.  As needed hydralazine on board as well.   DM type II.  In poor control due to hyperglycemia as suggested by A1c, diabetic and  insulin education, on long-acting insulin and sliding scale monitor and adjust.   Lab Results  Component Value Date   HGBA1C 9.7 (H) 05/13/2022   CBG (last 3)  Recent Labs    05/15/22 1613 05/15/22 2049 05/16/22 0809  GLUCAP 207* 181* 96        Condition - Extremely Guarded  Family Communication  : Called daughter Lynelle Smoke [phone] on 05/16/2022 at 9:25 AM -phone switched off  Code Status :  Full  Consults  :  Dr Sharol Given  PUD Prophylaxis : PPI   Procedures  :      TTE -   MRI L foot - 1. There is a plantar soft tissue ulcer at the level of the fourth metatarsal head and metatarsophalangeal joint with sinus tract extending to the fourth metatarsal head and metatarsophalangeal joint. There is cortical destruction and marrow edema indicating acute osteomyelitis of the distal fourth metatarsal head and the 4th proximal phalanx. The metatarsal edema extends all the way proximally to the proximal metaphysis. There is also a moderate to large fourth metatarsophalangeal joint effusion consistent with septic arthritis. 2. Additional marrow edema and probable cortical erosion within the proximal fifth metatarsal also concerning for osteomyelitis. Tiny fifth metatarsophalangeal joint effusion. Mild marrow edema within the plantar aspect of the fifth metatarsal head may be secondary to degenerative changes, however it is difficult to exclude early minimal osteomyelitis. 3. Chronic nonunited fracture of the proximal second metatarsal.  ABI L Leg  - No evidence of hemodynamically significant lower extremity arterial occlusive disease at rest      Disposition Plan  :    Status is:  Inpatient   DVT Prophylaxis  :    enoxaparin (LOVENOX) injection 40 mg Start: 05/13/22 2200    Lab Results  Component Value Date   PLT 177 05/16/2022    Diet :  Diet Order             Diet heart healthy/carb modified Room service appropriate? Yes; Fluid consistency: Thin  Diet effective now                    Inpatient Medications  Scheduled Meds:  amitriptyline  50 mg Oral QHS   aspirin EC  81 mg Oral Daily   atorvastatin  20 mg Oral q1800   calcium carbonate  500 mg of elemental calcium Oral Q breakfast   cyanocobalamin  1,000 mcg Oral Daily   enoxaparin (LOVENOX) injection  40 mg Subcutaneous Q24H   famotidine  20 mg Oral QHS   gabapentin  300 mg Oral TID   insulin aspart  0-20 Units Subcutaneous TID WC   insulin aspart  0-5 Units Subcutaneous QHS   insulin aspart protamine- aspart  50 Units Subcutaneous BID WC   LORazepam  1 mg Oral QHS   pantoprazole  40 mg Oral BID AC   sodium hypochlorite   Topical BID   tamsulosin  0.4 mg Oral Daily   Continuous Infusions:  cefTRIAXone (ROCEPHIN)  IV 2 g (05/15/22 1144)   vancomycin 1,000 mg (05/16/22 0216)   PRN Meds:.acetaminophen **OR** acetaminophen, nitroGLYCERIN, ondansetron **OR** ondansetron (ZOFRAN) IV, oxycodone    Objective:   Vitals:   05/15/22 2012 05/15/22 2351 05/16/22 0525 05/16/22 0810  BP: 124/87 113/87 108/82 119/85  Pulse: (!) 102 94 95 (!) 108  Resp: '16 16 16 17  '$ Temp: 98.1 F (36.7 C) 97.8 F (36.6 C) (!) 97.4 F (36.3 C) 98.7 F (37.1 C)  TempSrc: Oral Oral  Oral Oral  SpO2: 96% 99% 97% 100%  Weight:      Height:        Wt Readings from Last 3 Encounters:  05/13/22 96.6 kg  02/11/21 102.2 kg  01/09/21 100.7 kg     Intake/Output Summary (Last 24 hours) at 05/16/2022 0914 Last data filed at 05/15/2022 1500 Gross per 24 hour  Intake 300.1 ml  Output --  Net 300.1 ml     Physical Exam  Awake Alert, No new F.N deficits, Normal affect [place].AT,PERRAL Supple Neck, No JVD,    Symmetrical Chest wall movement, Good air movement bilaterally, CTAB RRR,No Gallops,Rubs or new Murmurs,  +ve B.Sounds, Abd Soft, No tenderness,   Left foot under bandage.    RN pressure injury documentation: Pressure Injury 05/13/22 Foot Left;Posterior;Lower Unstageable - Full thickness tissue loss in which the base of the injury is covered by slough (yellow, tan, gray, green or brown) and/or eschar (tan, brown or black) in the wound bed. (Active)  05/13/22 1700  Location: Foot  Location Orientation: Left;Posterior;Lower  Staging: Unstageable - Full thickness tissue loss in which the base of the injury is covered by slough (yellow, tan, gray, green or brown) and/or eschar (tan, brown or black) in the wound bed.  Wound Description (Comments):   Present on Admission: Yes  Dressing Type Gauze (Comment) 05/15/22 1827     Signature  Lala Lund M.D on 05/16/2022 at 9:14 AM   -  To page go to [email or web address]      Data Review:    CBC Recent Labs  Lab 05/13/22 1207 05/13/22 1425 05/14/22 0517 05/15/22 0630 05/16/22 0818  WBC 6.5 5.5 5.0 5.6 5.5  HGB 14.3 12.2* 13.5 14.6 14.4  HCT 40.9 34.9* 38.3* 43.2 42.2  PLT 221 172 191 200 177  MCV 82.6 83.5 84.2 86.6 85.4  MCH 28.9 29.2 29.7 29.3 29.1  MCHC 35.0 35.0 35.2 33.8 34.1  RDW 13.7 13.7 13.7 14.1 13.8  LYMPHSABS 0.9  --   --   --   --   MONOABS 0.4  --   --   --   --   EOSABS 0.1  --   --   --   --   BASOSABS 0.0  --   --   --   --     Electrolytes Recent Labs  Lab 05/13/22 1207 05/13/22 1415 05/13/22 1425 05/14/22 0517 05/14/22 0810 05/15/22 0630 05/16/22 0615  NA 131*  --   --  139  --  140 139  K 4.6  --   --  3.6  --  3.1* 3.7  CL 94*  --   --  105  --  103 108  CO2 26  --   --  26  --  26 25  GLUCOSE 469*  --   --  133*  --  98 79  BUN 13  --   --  10  --  7* <5*  CREATININE 1.15  --  1.08 0.92  --  0.89 0.93  CALCIUM 9.0  --   --  8.7*  --  8.7* 9.2  AST 14*  --   --   --   --   --   --   ALT 16  --    --   --   --   --   --   ALKPHOS 179*  --   --   --   --   --   --  BILITOT 1.9*  --   --   --   --   --   --   ALBUMIN 4.2  --   --   --   --   --   --   MG  --   --   --  1.8  --  2.1 1.9  LATICACIDVEN 1.5 2.4*  --   --  1.1  --   --   INR 1.1  --   --   --   --   --  1.2  HGBA1C  --   --  9.7*  --   --   --   --     Lab Results  Component Value Date   HGBA1C 9.7 (H) 05/13/2022    ID Labs Recent Labs  Lab 05/13/22 1207 05/13/22 1415 05/13/22 1425 05/14/22 0517 05/14/22 0810 05/15/22 0630 05/16/22 0615 05/16/22 0818  WBC 6.5  --  5.5 5.0  --  5.6  --  5.5  PLT 221  --  172 191  --  200  --  177  LATICACIDVEN 1.5 2.4*  --   --  1.1  --   --   --   CREATININE 1.15  --  1.08 0.92  --  0.89 0.93  --     Radiology Reports MR FOOT LEFT W WO CONTRAST  Result Date: 05/13/2022 CLINICAL DATA:  Foot swelling. Nondiabetic. Osteomyelitis suspected. Lytic destruction involving the distal portion of fourth metatarsal and proximal base of the fourth proximal phalanx concerning for osteomyelitis. EXAM: MRI OF THE LEFT FOREFOOT WITHOUT AND WITH CONTRAST TECHNIQUE: Multiplanar, multisequence MR imaging of the left was performed both before and after administration of intravenous contrast. CONTRAST:  9 ml Vueway gadolinium IV COMPARISON:  Left foot radiographs 05/13/2022 and 10/06/2021 FINDINGS: Bones/Joint/Cartilage There is a plantar soft tissue ulcer and sinus tract (described below) extending to the fourth metatarsal head and metatarsophalangeal joint. There is moderate cortical erosion and fragmentation of the fourth metatarsal head, with multiple apparent fracture line lucencies (sagittal image 22) and approximately 3 mm dorsal cortical step-off of the distal aspect of the fourth metatarsal head with respect to the fourth metatarsal neck. There is also fragmentation of the proximal phalanx of the great toe. Moderate to high-grade fourth metatarsophalangeal joint effusion. There is high-grade  marrow edema throughout the entire proximal phalanx of the fourth toe and the distal aspect of the fourth metatarsal extending all the way proximally to the proximal metaphysis of the fourth metatarsal (sagittal image 22). Possible marrow edema within portions of the fourth toe middle and distal phalanges, however evaluation is limited by patient motion artifact and the small size of these joints. There also associated degenerative changes of the interphalangeal joints there may be a cause for subchondral marrow edema. There is marrow edema within the proximal 40% of the proximal phalanx of the fifth digit with proximal cortical erosion concerning for osteomyelitis. Small fifth metatarsophalangeal joint effusion. Mild marrow edema within the plantar aspect of the fifth metatarsal head. No definitive cortical destruction within the fifth metatarsal head, and this could be degenerative edema, however osteomyelitis cannot be excluded. There is curvilinear fluid bright signal across a nonunited chronic appearing fracture of the proximal metaphysis of the second metatarsal, also seen on prior radiographs with high-grade surrounding callus formation. 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 Subchondral cystic change and surrounding marrow edema within the lateral cuneiform at the third tarsometatarsal joint. Ligaments There is metallic artifact related  to the previously seen surgical staple fixating the first tarsometatarsal joint. There is scattered metallic artifact across the medial cuneiform-base of the second metatarsal Lisfranc ligament complex. Muscles and Tendons The visualized second extensor tendons appear intact. There is diffuse fatty infiltration of the visualized midfoot and forefoot musculature. Soft tissues There is an ulceration within the forefoot soft tissues plantar to the fourth metatarsal head and metatarsophalangeal joint, measuring up to 13 mm in transverse dimension  and 15 mm in AP dimension (series 10, image 20 and series 9, image 22). There is a sinus tract extending from this region directly to the distal fourth metatarsal head and metatarsophalangeal joint. Moderate edema and swelling of the dorsal midfoot subcutaneous fat. IMPRESSION: 1. There is a plantar soft tissue ulcer at the level of the fourth metatarsal head and metatarsophalangeal joint with sinus tract extending to the fourth metatarsal head and metatarsophalangeal joint. There is cortical destruction and marrow edema indicating acute osteomyelitis of the distal fourth metatarsal head and the 4th proximal phalanx. The metatarsal edema extends all the way proximally to the proximal metaphysis. There is also a moderate to large fourth metatarsophalangeal joint effusion consistent with septic arthritis. 2. Additional marrow edema and probable cortical erosion within the proximal fifth metatarsal also concerning for osteomyelitis. Tiny fifth metatarsophalangeal joint effusion. Mild marrow edema within the plantar aspect of the fifth metatarsal head may be secondary to degenerative changes, however it is difficult to exclude early minimal osteomyelitis. 3. Chronic nonunited fracture of the proximal second metatarsal. Electronically Signed   By: Yvonne Kendall M.D.   On: 05/13/2022 19:10   US ARTERIAL ABI (SCREENING LOWER EXTREMITY)  Result Date: 05/13/2022 CLINICAL DATA:  Peripheral vascular disease EXAM: NONINVASIVE PHYSIOLOGIC VASCULAR STUDY OF BILATERAL LOWER EXTREMITIES TECHNIQUE: Evaluation of both lower extremities were performed at rest, including calculation of ankle-brachial indices with single level Doppler, pressure and pulse volume recording. COMPARISON:  06/14/2019 FINDINGS: Right ABI:  1.27 Left ABI:  1.24 Right Lower Extremity: Multiphasic arterial waveform in the posterior tibial artery. Monophasic in dorsalis pedis. Left Lower Extremity: Multiphasic posterior tibial waveform. Monophasic dorsalis  pedis. IMPRESSION: No evidence of hemodynamically significant lower extremity arterial occlusive disease at rest. Electronically Signed   By: Lucrezia Europe M.D.   On: 05/13/2022 14:49   DG Foot Complete Left  Result Date: 05/13/2022 CLINICAL DATA:  Left foot wound. EXAM: LEFT FOOT - COMPLETE 3+ VIEW COMPARISON:  October 06, 2021. FINDINGS: Status post surgical fusion of the first tarsometatarsal joint. There appears to be lytic destruction involving the distal portion of the fourth metatarsal and the proximal base of the fourth proximal phalanx concerning for osteomyelitis. Mild posterior calcaneal spurring is noted. IMPRESSION: Lytic destruction involving distal portion of fourth metatarsal and proximal base of fourth proximal phalanx concerning for osteomyelitis. Electronically Signed   By: Marijo Conception M.D.   On: 05/13/2022 12:49   DG Chest Port 1 View  Result Date: 05/13/2022 CLINICAL DATA:  Sepsis. EXAM: PORTABLE CHEST 1 VIEW COMPARISON:  Dec 29, 2017. FINDINGS: The heart size and mediastinal contours are within normal limits. Both lungs are clear. The visualized skeletal structures are unremarkable. IMPRESSION: No active disease. Electronically Signed   By: Marijo Conception M.D.   On: 05/13/2022 12:47

## 2022-05-16 NOTE — Evaluation (Signed)
Occupational Therapy Evaluation Patient Details Name: Mike Floyd MRN: [id number] DOB: Apr 12, 1961 Today's Date: 05/16/2022   History of Present Illness Pt is a 61 y.o. male admitted 10/5 with L foot  fourth and fifth metatarsal osteomyelitis. He presented from his PCP office with worsening pain, swelling, and redness to his left foot. PMH:  poorly controlled type 2 diabetes, GERD, hypertension, dyslipidemia, CAD, and neuropathy   Clinical Impression   Pt independent at baseline with ADLs and uses cane for functional mobility, pt at baseline with some pain in LLE with mobility. Educated pt on WB precautions, able to ambulate short distance in room with RW while adhering to precautions. Pt completing LB dressing with supervision. Pt presenting with impairments listed below, will follow acutely. Anticipate HHOT at d/c, however TBD pending surgery on 10/11.      Recommendations for follow up therapy are one component of a multi-disciplinary discharge planning process, led by the attending physician.  Recommendations may be updated based on patient status, additional functional criteria and insurance authorization.   Follow Up Recommendations  Other (comment) (anticipate HHOT, however TBD post-surgery on 10/11)    Assistance Recommended at Discharge Set up Supervision/Assistance  Patient can return home with the following A little help with walking and/or transfers;A little help with bathing/dressing/bathroom;Assistance with cooking/housework;Assist for transportation;Help with stairs or ramp for entrance    Functional Status Assessment  Patient has had a recent decline in their functional status and demonstrates the ability to make significant improvements in function in a reasonable and predictable amount of time.  Equipment Recommendations  BSC/3in1    Recommendations for Other Services PT consult     Precautions / Restrictions Precautions Precautions: Fall Restrictions Weight  Bearing Restrictions: Yes LLE Weight Bearing: Partial weight bearing Other Position/Activity Restrictions: "Partial weight training on the affected L. foot, please use a cane or a walker for now." - per Dr Candiss Norse 10/8      Mobility Bed Mobility Overal bed mobility: Modified Independent                  Transfers Overall transfer level: Needs assistance Equipment used: Rolling walker (2 wheels) Transfers: Sit to/from Stand Sit to Stand: Supervision           General transfer comment: min cues for WB precautions      Balance Overall balance assessment: Needs assistance Sitting-balance support: Feet supported Sitting balance-Leahy Scale: Good     Standing balance support: Reliant on assistive device for balance Standing balance-Leahy Scale: Fair                             ADL either performed or assessed with clinical judgement   ADL Overall ADL's : At baseline                                       General ADL Comments: pt needing supervision-min guard for ADLs at time of evaluation     Vision   Vision Assessment?: No apparent visual deficits     Perception     Praxis      Pertinent Vitals/Pain Pain Assessment Pain Assessment: Faces Pain Score: 9  Faces Pain Scale: Hurts whole lot Pain Location: LLE Pain Descriptors / Indicators: Discomfort Pain Intervention(s): Limited activity within patient's tolerance, Monitored during session, Repositioned     Hand Dominance Right  Extremity/Trunk Assessment Upper Extremity Assessment Upper Extremity Assessment: Overall WFL for tasks assessed   Lower Extremity Assessment Lower Extremity Assessment: Defer to PT evaluation   Cervical / Trunk Assessment Cervical / Trunk Assessment: Normal   Communication Communication Communication: HOH   Cognition Arousal/Alertness: Awake/alert Behavior During Therapy: WFL for tasks assessed/performed Overall Cognitive Status: Within  Functional Limits for tasks assessed                                       General Comments  VSS on RA    Exercises     Shoulder Instructions      Home Living Family/patient expects to be discharged to:: Private residence Living Arrangements: Spouse/significant other Available Help at Discharge: Family;Available 24 hours/day Type of Home: Mobile home Home Access: Stairs to enter Entrance Stairs-Number of Steps: 4   Home Layout: One level     Bathroom Shower/Tub: Occupational psychologist: Standard     Home Equipment: Cane - single point          Prior Functioning/Environment Prior Level of Function : Independent/Modified Independent;Driving             Mobility Comments: uses cane ADLs Comments: ind        OT Problem List: Decreased strength;Decreased range of motion;Decreased activity tolerance;Impaired balance (sitting and/or standing);Decreased safety awareness      OT Treatment/Interventions: Therapeutic exercise;Self-care/ADL training;Energy conservation;DME and/or AE instruction;Therapeutic activities;Patient/family education;Balance training    OT Goals(Current goals can be found in the care plan section) Acute Rehab OT Goals Patient Stated Goal: none stated OT Goal Formulation: With patient Time For Goal Achievement: 05/30/22 Potential to Achieve Goals: Good ADL Goals Pt Will Perform Upper Body Dressing: Independently;sitting Pt Will Perform Lower Body Dressing: Independently;sitting/lateral leans Pt Will Transfer to Toilet: Independently;ambulating;regular height toilet  OT Frequency: Min 2X/week    Co-evaluation              AM-PAC OT "6 Clicks" Daily Activity     Outcome Measure Help from another person eating meals?: None Help from another person taking care of personal grooming?: None Help from another person toileting, which includes using toliet, bedpan, or urinal?: None Help from another person bathing  (including washing, rinsing, drying)?: A Little Help from another person to put on and taking off regular upper body clothing?: None Help from another person to put on and taking off regular lower body clothing?: A Little 6 Click Score: 22   End of Session Equipment Utilized During Treatment: Rolling walker (2 wheels) Nurse Communication: Mobility status  Activity Tolerance: Patient tolerated treatment well Patient left: in bed;with call bell/phone within reach;with bed alarm set  OT Visit Diagnosis: Unsteadiness on feet (R26.81);Other abnormalities of gait and mobility (R26.89);Muscle weakness (generalized) (M62.81)                Time: 5427-0623 OT Time Calculation (min): 13 min Charges:  OT General Charges $OT Visit: 1 Visit OT Evaluation $OT Eval Low Complexity: 1 Low  Lynnda Child, OTD, OTR/L Acute Rehab [phone]) [phone] - [place] 05/16/2022, 12:31 PM

## 2022-05-16 NOTE — Progress Notes (Signed)
Pharmacy Antibiotic Note  Mike Floyd is a 61 y.o. male admitted on 05/13/2022 with cellulitis with concern for osteomyelitis.  Pharmacy has been consulted for vancomycin dosing. Scr 0.93 and improving. CrCl 100 mL/min. WBC 5.5 and afebrile.  Plan: Continue Vancomycin 1000 mg IV Q 12 hrs. Goal AUC 400-550. Continue ceftriaxone 2 g IV q24 hrs.  With improving renal function, will order vancomycin levels tonight to determine further dosing.   Height: '6\' 1"'$  (185.4 cm) Weight: 96.6 kg (213 lb) IBW/kg (Calculated) : 79.9  Temp (24hrs), Avg:98.2 F (36.8 C), Min:97.4 F (36.3 C), Max:98.8 F (37.1 C)  Recent Labs  Lab 05/13/22 1207 05/13/22 1415 05/13/22 1425 05/14/22 0517 05/14/22 0810 05/15/22 0630 05/16/22 0615 05/16/22 0818  WBC 6.5  --  5.5 5.0  --  5.6  --  5.5  CREATININE 1.15  --  1.08 0.92  --  0.89 0.93  --   LATICACIDVEN 1.5 2.4*  --   --  1.1  --   --   --     Estimated Creatinine Clearance: 100.9 mL/min (by C-G formula based on SCr of 0.93 mg/dL).    No Known Allergies  Antimicrobials this admission: Vancomycin 10/5 >>  Ceftriaxone 10/5 >>   Dose adjustments this admission:  Microbiology results: 10/5 BCx: no growth 3 days 10/8 MRSA PCR: sent  Thank you for allowing pharmacy to be a part of this patient's care.  Rhae Lerner Trenity Pha 06/14/5207 02:23 AM

## 2022-05-17 DIAGNOSIS — E1159 Type 2 diabetes mellitus with other circulatory complications: Secondary | ICD-10-CM | POA: Diagnosis not present

## 2022-05-17 DIAGNOSIS — M86172 Other acute osteomyelitis, left ankle and foot: Secondary | ICD-10-CM

## 2022-05-17 DIAGNOSIS — I2510 Atherosclerotic heart disease of native coronary artery without angina pectoris: Secondary | ICD-10-CM | POA: Diagnosis not present

## 2022-05-17 DIAGNOSIS — I10 Essential (primary) hypertension: Secondary | ICD-10-CM | POA: Diagnosis not present

## 2022-05-17 DIAGNOSIS — M869 Osteomyelitis, unspecified: Secondary | ICD-10-CM | POA: Diagnosis not present

## 2022-05-17 LAB — BASIC METABOLIC PANEL
Anion gap: 8 (ref 5–15)
BUN: 7 mg/dL — ABNORMAL LOW (ref 8–23)
CO2: 24 mmol/L (ref 22–32)
Calcium: 8.9 mg/dL (ref 8.9–10.3)
Chloride: 105 mmol/L (ref 98–111)
Creatinine, Ser: 1.19 mg/dL (ref 0.61–1.24)
GFR, Estimated: >60 mL/min (ref >60)
Glucose, Bld: 161 mg/dL — ABNORMAL HIGH (ref 70–99)
Potassium: 3.4 mmol/L — ABNORMAL LOW (ref 3.5–5.1)
Sodium: 137 mmol/L (ref 135–145)

## 2022-05-17 LAB — VANCOMYCIN, TROUGH: Vancomycin Tr: 14 ug/mL — ABNORMAL LOW (ref 15–20)

## 2022-05-17 LAB — CBC WITH DIFFERENTIAL/PLATELET
Abs Immature Granulocytes: 0.02 10*3/uL (ref 0.00–0.07)
Basophils Absolute: 0.1 10*3/uL (ref 0.0–0.1)
Basophils Relative: 1 %
Eosinophils Absolute: 0.1 10*3/uL (ref 0.0–0.5)
Eosinophils Relative: 2 %
HCT: 39.9 % (ref 39.0–52.0)
Hemoglobin: 13.8 g/dL (ref 13.0–17.0)
Immature Granulocytes: 0 %
Lymphocytes Relative: 24 %
Lymphs Abs: 1.6 10*3/uL (ref 0.7–4.0)
MCH: 29.2 pg (ref 26.0–34.0)
MCHC: 34.6 g/dL (ref 30.0–36.0)
MCV: 84.4 fL (ref 80.0–100.0)
Monocytes Absolute: 0.6 10*3/uL (ref 0.1–1.0)
Monocytes Relative: 9 %
Neutro Abs: 4.3 10*3/uL (ref 1.7–7.7)
Neutrophils Relative %: 64 %
Platelets: 187 10*3/uL (ref 150–400)
RBC: 4.73 MIL/uL (ref 4.22–5.81)
RDW: 13.5 % (ref 11.5–15.5)
WBC: 6.7 10*3/uL (ref 4.0–10.5)
nRBC: 0 % (ref 0.0–0.2)

## 2022-05-17 LAB — BRAIN NATRIURETIC PEPTIDE: B Natriuretic Peptide: 20.8 pg/mL (ref 0.0–100.0)

## 2022-05-17 LAB — GLUCOSE, CAPILLARY
Glucose-Capillary: 152 mg/dL — ABNORMAL HIGH (ref 70–99)
Glucose-Capillary: 104 mg/dL — ABNORMAL HIGH (ref 70–99)
Glucose-Capillary: 147 mg/dL — ABNORMAL HIGH (ref 70–99)
Glucose-Capillary: 240 mg/dL — ABNORMAL HIGH (ref 70–99)

## 2022-05-17 LAB — MAGNESIUM: Magnesium: 1.8 mg/dL (ref 1.7–2.4)

## 2022-05-17 LAB — PROCALCITONIN: Procalcitonin: <0.1 ng/mL

## 2022-05-17 MED ORDER — POTASSIUM CHLORIDE CRYS ER 20 MEQ PO TBCR
40.0000 meq | EXTENDED_RELEASE_TABLET | Freq: Once | ORAL | Status: AC
  Administered 2022-05-17: 40 meq via ORAL
  Filled 2022-05-17: qty 2

## 2022-05-17 NOTE — Progress Notes (Signed)
Mobility Specialist Progress Note:   05/17/22 0957  Mobility  Activity Ambulated with assistance in hallway  Activity Response Tolerated well  Distance Ambulated (ft) 100 ft  $Mobility charge 1 Mobility  Level of Assistance Standby assist, set-up cues, supervision of patient - no hands on  Assistive Device Front wheel walker  LLE Weight Bearing PWB  Mobility Referral Yes   Pt received in bed and agreeable. No complaints. Pt left in bed with all needs met and call bell in reach.   Genever Hentges Mobility Specialist-Acute Rehab Secure Chat only

## 2022-05-17 NOTE — Consult Note (Signed)
ORTHOPAEDIC CONSULTATION  REQUESTING PHYSICIAN: Thurnell Lose, MD  Chief Complaint: Ulceration plantar aspect left foot.  HPI: Mike Floyd is a 61 y.o. male who presents with history of diabetes with ulceration left foot.  Patient had previously undergone gastrocnemius recession and dorsal closing wedge osteotomy to resolve an ulcer beneath the first metatarsal head left foot.  This has not reoccurred.  Patient presents with a chronic draining ulcer beneath the fourth metatarsal head and a pressure ulcer on the left heel.  Patient's recent vascular studies with radiology shows no flow restrictions to either lower extremity.  Past Medical History:  Diagnosis Date   AICD (automatic cardioverter/defibrillator) present 11/2007   a. 11/2007 S[facility] Current VR - single lead ICD  - Removed 2018 - "it was burning me"   Anxiety    CAD (coronary artery disease)    non-obstructive CAD by Cor CT in 2020 // Myoview 3/22: EF 57, small inf-sept defect c/w scar, no ischemia; low risk      Chest pain    a. 10/2007 Cath:  normal Cors.   CKD (chronic kidney disease), stage II    DDD (degenerative disc disease), lumbar    Diabetes mellitus DX: 2010   Erosive esophagitis    a. per EGD (08/2011), Dr. Laural Golden - Erosive reflux esophagitis improved but not completely healed since previous EGD 3 years ago. Bx showing  ulcerated gatroesophageal junction mucosa. negative for H. pylori   GERD (gastroesophageal reflux disease)    Gout    Hearing deficit    a. wear bilateral hearing aides   History of hiatal hernia    Hypertension    Mildly dilatd aortic root ([place])    CMR 4/22: EF 52, no LGE; d/w Dr. Marlowe Kays root 38 mm (mildly dilated)   Myocardial infarction [facility]) 2011   Neuropathy    Feet and legs   Nonischemic dilated cardiomyopathy ([place])    a. H/O EF as low as 35-40% by LV gram 10/2007;  b. Echo 02/2011 EF 50-55%, inf HK, Gr 1 DD // CMR 4/22: EF 52, no LGE; d/w Dr. Marlowe Kays root 38 mm (mildly  dilated)    Renal insufficiency    Sleep apnea    pt doesnt use, states"I cant afford one". PCP aware   Stroke ([place])    mini-stroke in 2014   TIA (transient ischemic attack)    July, 2013   Tobacco abuse, in remission 06/27/2009   Discontinued in 2009     Wears dentures    top plate   WPW (Wolff-Parkinson-White syndrome)    a. s/p RFCA @ [place]   Past Surgical History:  Procedure Laterality Date   [place]     2009   [place], LAPAROSCOPIC     11/2007   COLONOSCOPY W/ POLYPECTOMY  2009   ELBOW SURGERY Left 06/2010   ESOPHAGEAL DILATION N/A 11/08/2014   Procedure: ESOPHAGEAL DILATION;  Surgeon: Rogene Houston, MD;  Location: [facility];  Service: Endoscopy;  Laterality: N/A;  #56,    ESOPHAGOGASTRODUODENOSCOPY  03/31/2012   also 08/2011; [name]   ESOPHAGOGASTRODUODENOSCOPY (EGD) WITH PROPOFOL N/A 11/08/2014   Procedure: ESOPHAGOGASTRODUODENOSCOPY (EGD) WITH PROPOFOL;  Surgeon: Rogene Houston, MD;  Location: [facility];  Service: Endoscopy;  Laterality: N/A;  [place] is 39 , [place] is 37   FOOT ARTHRODESIS  Left 10/24/2020   Procedure: LEFT GASTROCNEMIUS RECESSION, DORSIFLEXION OSTEOTOMY 1ST MT;  Surgeon: [name], MD;  Location: [facility];  Service: Orthopedics;  Laterality: Left;   FOOT ARTHRODESIS Right 01/09/2021   Procedure: CLOSING WEDGE OSTEOTOMY RIGHT 1ST METATARSAL;  Surgeon: [name], MD;  Location: [facility];  Service: Orthopedics;  Laterality: Right;   GASTROCNEMIUS RECESSION Right 01/09/2021   Procedure: RIGHT GASTROCNEMIUS RECESSION;  Surgeon: [name], MD;  Location: [facility];  Service: Orthopedics;  Laterality: Right;   ICD LEAD REMOVAL N/A 10/07/2016   Procedure: ICD LEAD REMOVAL ;  Surgeon: Evans Lance, MD;  Location: [facility];  Service: Cardiovascular;  Laterality: N/A;   [place];  performed at [facility]   TEE WITHOUT CARDIOVERSION N/A 10/07/2016   Procedure: TRANSESOPHAGEAL ECHOCARDIOGRAM (TEE);  Surgeon: Evans Lance, MD;  Location: [facility];  Service: Cardiovascular;  Laterality: N/A;   Social History   Socioeconomic History   Marital status: Married    Spouse name: Not on file   Number of children: Not on file   Years of education: Not on file   Highest education level: Not on file  Occupational History   Occupation: Worked for [employer]    Employer: [employer]    Comment: Laid off in 11/11  Tobacco Use   Smoking status: Former    Packs/day: 1.50    Years: 35.00    Total pack years: 52.50    Types: Cigarettes    Start date: 06/30/1971    Quit date: 07/30/2010    Years since quitting: 11.8   Smokeless tobacco: Former    Types: Chew    Quit date: 03/26/1994  Vaping Use   Vaping Use: Former   Start date: 07/30/2010   Quit date: 06/29/2016  Substance and Sexual Activity   Alcohol use: No    Alcohol/week: 1.0 standard drink of alcohol    Types: 1 Cans of beer per week    Comment: used "years ago"   Drug use: No   Sexual activity: Never    Birth control/protection: None  Other Topics Concern   Not on file  Social History Narrative   Not on file   Social Determinants of Health   Financial Resource Strain: Not on file  Food Insecurity: No Food Insecurity (05/13/2022)   Hunger Vital Sign    Worried About Running Out of Food in the Last Year: Never true    [place] in the Last Year: Never true  Transportation Needs: No Transportation Needs (05/13/2022)   PRAPARE - Hydrologist (Medical): No    Lack of Transportation (Non-Medical): No  Physical Activity: Not on file  Stress: Not on file  Social Connections: Not on file   Family History  Problem Relation Age of Onset   Heart attack Mother 30   Hypertension Mother    Diabetes Mother    Kidney disease Mother    Breast cancer Mother    Heart attack Father  70   Hypertension Father    Diabetes Father    Stroke Brother    Heart attack Brother 28   Stroke Maternal Grandmother 59   Diabetes Brother    Hypertension Brother    - negative except otherwise stated in the family history section No Known Allergies Prior to Admission medications   Medication Sig Start Date End Date Taking? Authorizing Provider  amitriptyline (  ELAVIL) 50 MG tablet Take 50 mg by mouth at bedtime. 12/04/13  Yes [provider]  aspirin 81 MG tablet Take 81 mg by mouth daily.   Yes [provider]  atorvastatin (LIPITOR) 20 MG tablet Take 20 mg by mouth daily.   Yes [provider]  calcium carbonate (OSCAL) 1500 (600 Ca) MG TABS tablet Take 600 mg of elemental calcium by mouth daily with breakfast.   Yes [provider]  carvedilol (COREG) 12.5 MG tablet TAKE 1 TABLET BY MOUTH TWICE DAILY. 03/18/22  Yes Josue Hector, MD  famotidine (PEPCID) 20 MG tablet Take 20 mg by mouth daily.   Yes [provider]  furosemide (LASIX) 40 MG tablet TAKE (1) TABLET BY MOUTH ONCE DAILY. Patient taking differently: Take 40 mg by mouth daily. 08/06/19  Yes Herminio Commons, MD  gabapentin (NEURONTIN) 600 MG tablet Take 300 mg by mouth 3 (three) times daily. 09/05/20  Yes [provider]  isosorbide mononitrate (IMDUR) 30 MG 24 hr tablet TAKE (1) TABLET BY MOUTH ONCE DAILY. Patient taking differently: Take 30 mg by mouth daily. 01/28/20  Yes Herminio Commons, MD  LORazepam (ATIVAN) 1 MG tablet Take 1 mg by mouth at bedtime.   Yes [provider]  nitroGLYCERIN (NITROSTAT) 0.4 MG SL tablet Place 0.4 mg under the tongue every 5 (five) minutes as needed for chest pain.   Yes [provider]  NOVOLOG MIX 70/30 FLEXPEN (70-30) 100 UNIT/ML FlexPen Inject 50 Units into the skin 2 (two) times daily. 03/05/22  Yes [provider]  oxycodone (ROXICODONE) 30 MG immediate release tablet Take 30-60 mg by mouth 4 (four)  times daily as needed for pain. 12/16/20  Yes [provider]  pantoprazole (PROTONIX) 40 MG tablet Take 40 mg by mouth 2 (two) times daily. 07/29/21  Yes [provider]  potassium chloride SA (KLOR-CON M) 20 MEQ tablet TAKE (1) TABLET BY MOUTH ONCE DAILY. 10/14/21  Yes Josue Hector, MD  tamsulosin (FLOMAX) 0.4 MG CAPS capsule Take 0.4 mg by mouth daily. am   Yes [provider]  vitamin B-12 (CYANOCOBALAMIN) 1000 MCG tablet Take 1,000 mcg by mouth daily.   Yes [provider]  acetaminophen-codeine (TYLENOL #3) 300-30 MG tablet Take 1-2 tablets by mouth every 4 (four) hours as needed for moderate pain. Patient not taking: Reported on 05/13/2022 03/05/22   Felipa Furnace, DPM  acetaminophen-codeine (TYLENOL #3) 300-30 MG tablet Take 1-2 tablets by mouth every 4 (four) hours as needed for moderate pain. Patient not taking: Reported on 05/13/2022 03/12/22   Felipa Furnace, DPM  amoxicillin-clavulanate (AUGMENTIN) 875-125 MG tablet Take 1 tablet by mouth 2 (two) times daily. Patient not taking: Reported on 05/13/2022 10/06/21   Tyson Dense T, DPM  B-D ULTRAFINE III SHORT PEN 31G X 8 MM MISC USE WITH TRESBIA. 10/27/17   Cassandria Anger, MD  clotrimazole-betamethasone (LOTRISONE) cream Apply topically 2 (two) times daily. Patient not taking: Reported on 05/13/2022 08/31/21   [provider]  insulin degludec (TRESIBA FLEXTOUCH) 100 UNIT/ML SOPN FlexTouch Pen Inject 0.3 mLs (30 Units total) into the skin at bedtime. Patient not taking: Reported on 05/13/2022 10/27/17   Cassandria Anger, MD  mupirocin ointment (BACTROBAN) 2 % Apply 1 application topically 2 (two) times daily. Patient not taking: Reported on 05/13/2022 10/06/21   Hyatt, Max T, DPM  omeprazole (PRILOSEC) 40 MG capsule TAKE (1) CAPSULE BY MOUTH ONCE DAILY. Patient not taking: Reported on  05/13/2022 01/14/21   Rogene Houston, MD  ONETOUCH VERIO test strip TESTING 4 TIMES DAILY. 12/25/18   Cassandria Anger, MD  SSD 1 % cream APPLY TO AFFECTED AREA DAILY AS DIRECTED. Patient not taking: Reported on 05/13/2022 06/11/21   [name], MD  sucralfate (CARAFATE) 1 g tablet TAKE 1 TABLET BY MOUTH 30 MINTUES BEFORE MEALS AND AT BEDTIME. Patient not taking: Reported on 05/13/2022 06/03/20   Ezzard Standing, PA-C   ECHOCARDIOGRAM COMPLETE  Result Date: 05/16/2022    ECHOCARDIOGRAM REPORT   Patient Name:   CHRISTINE SCHIEFELBEIN Date of Exam: 05/16/2022 Medical Rec #:  [id number]     Height:       73.0 in Accession #:    [id number]    Weight:       213.0 lb Date of Birth:  Mar 27, 1961    BSA:          2.210 m Patient Age:    40 years      BP:           119/85 mmHg Patient Gender: M             HR:           108 bpm. Exam Location:  Inpatient Procedure: 2D Echo, Cardiac Doppler, Color Doppler and Intracardiac            Opacification Agent Indications:    Congestive Heart Failure  History:        Patient has prior history of Echocardiogram examinations, most                 recent 01/08/2019. Cardiomyopathy, Previous Myocardial Infarction                 and CAD, Defibrillator, PAD; Risk Factors:Former Smoker,                 Hypertension and Diabetes. Chronic kidney disease.  Sonographer:    Darlina Sicilian RDCS Referring Phys: Margaree Mackintosh [facility]  Sonographer Comments: Suboptimal apical window, no subcostal window and suboptimal parasternal window. IMPRESSIONS  1. Left ventricular ejection fraction, by estimation, is 50 to 55%. The left ventricle has low normal function. The left ventricle has no regional wall motion abnormalities. Left ventricular diastolic parameters are consistent with Grade I diastolic dysfunction (impaired relaxation).  2. Right ventricular systolic function is normal. The right ventricular size is normal. Tricuspid regurgitation signal is inadequate for assessing PA pressure.  3. The mitral valve is normal in structure. No evidence of mitral valve regurgitation.  4. The aortic valve is  tricuspid. Aortic valve regurgitation is not visualized. No aortic stenosis is present.  5. Aortic dilatation noted. There is borderline dilatation of the aortic root, measuring 39 mm. Comparison(s): Prior images reviewed side by side. The left ventricular function has improved. FINDINGS  Left Ventricle: Left ventricular ejection fraction, by estimation, is 50 to 55%. The left ventricle has low normal function. The left ventricle has no regional wall motion abnormalities. Definity contrast agent was given IV to delineate the left ventricular endocardial borders. The left ventricular internal cavity size was normal in size. There is no left ventricular hypertrophy. Left ventricular diastolic parameters are consistent with Grade I diastolic dysfunction (impaired relaxation). Normal  left ventricular filling pressure. Right Ventricle: The right ventricular size is normal. No increase in right ventricular wall thickness. Right ventricular systolic function is normal. Tricuspid regurgitation signal is inadequate for assessing PA pressure. Left Atrium: Left atrial size  was normal in size. Right Atrium: Right atrial size was normal in size. Pericardium: There is no evidence of pericardial effusion. Mitral Valve: The mitral valve is normal in structure. No evidence of mitral valve regurgitation. Tricuspid Valve: The tricuspid valve is grossly normal. Tricuspid valve regurgitation is not demonstrated. Aortic Valve: The aortic valve is tricuspid. Aortic valve regurgitation is not visualized. No aortic stenosis is present. Pulmonic Valve: The pulmonic valve was not well visualized. Pulmonic valve regurgitation is not visualized. Aorta: Aortic dilatation noted. There is borderline dilatation of the aortic root, measuring 39 mm. Venous: The inferior vena cava was not well visualized. IAS/Shunts: The interatrial septum was not well visualized. Additional Comments: A is visualized in the right ventricle.  LEFT VENTRICLE PLAX 2D  LVIDd:         4.60 cm     Diastology LVIDs:         2.70 cm     LV e' medial:    7.62 cm/s LV PW:         0.90 cm     LV E/e' medial:  11.4 LV IVS:        0.90 cm     LV e' lateral:   8.70 cm/s LVOT diam:     2.10 cm     LV E/e' lateral: 9.9 LV SV:         46 LV SV Index:   21 LVOT Area:     3.46 cm  LV Volumes (MOD) LV vol d, MOD A2C: 53.6 ml LV vol d, MOD A4C: 51.9 ml LV vol s, MOD A2C: 28.7 ml LV vol s, MOD A4C: 26.5 ml LV SV MOD A2C:     24.9 ml LV SV MOD A4C:     51.9 ml LV SV MOD BP:      24.2 ml RIGHT VENTRICLE RV S prime:     12.20 cm/s TAPSE (M-mode): 1.9 cm LEFT ATRIUM             Index LA diam:        3.30 cm 1.49 cm/m LA Vol (A2C):   24.2 ml 10.95 ml/m LA Vol (A4C):   32.2 ml 14.57 ml/m LA Biplane Vol: 29.7 ml 13.44 ml/m  AORTIC VALVE LVOT Vmax:   73.80 cm/s LVOT Vmean:  48.100 cm/s LVOT VTI:    0.133 m  AORTA Ao Root diam: 3.90 cm Ao Asc diam:  3.30 cm MITRAL VALVE MV Area (PHT): 3.37 cm     SHUNTS MV Decel Time: 225 msec     Systemic VTI:  0.13 m MV E velocity: 86.50 cm/s   Systemic Diam: 2.10 cm MV A velocity: 127.00 cm/s MV E/A ratio:  0.68 Mihai Croitoru MD Electronically signed by Sanda Klein MD Signature Date/Time: 05/16/2022/11:28:43 AM    Final    - pertinent xrays, CT, MRI studies were reviewed and independently interpreted  Positive ROS: All other systems have been reviewed and were otherwise negative with the exception of those mentioned in the HPI and as above.  Physical Exam: General: Alert, no acute distress Psychiatric: Patient is competent for consent with normal mood and affect Lymphatic: No axillary or cervical lymphadenopathy Cardiovascular: No pedal edema Respiratory: No cyanosis, no use of accessory musculature GI: No organomegaly, abdomen is soft and non-tender    Images:  '@ENCIMAGES'$ @  Labs:  Lab Results  Component Value Date   HGBA1C 9.7 (H) 05/13/2022   HGBA1C 7.0 (H) 06/15/2017   HGBA1C 5.2 03/15/2013  CRP 0.6 05/16/2022   CRP 2.2 06/27/2019    LABURIC 5.5 03/07/2012   REPTSTATUS PENDING 05/13/2022   CULT  05/13/2022    NO GROWTH 4 DAYS Performed at [facility], [address] [place], [place] [address]     Lab Results  Component Value Date   ALBUMIN 4.2 05/13/2022   ALBUMIN 4.6 12/27/2013   ALBUMIN 4.4 05/30/2013   LABURIC 5.5 03/07/2012        Latest Ref Rng & Units 05/17/2022   12:33 AM 05/16/2022    8:18 AM 05/15/2022    6:30 AM  CBC EXTENDED  WBC 4.0 - 10.5 K/uL 6.7  5.5  5.6   RBC 4.22 - 5.81 MIL/uL 4.73  4.94  4.99   Hemoglobin 13.0 - 17.0 g/dL 13.8  14.4  14.6   HCT 39.0 - 52.0 % 39.9  42.2  43.2   Platelets 150 - 400 K/uL 187  177  200   NEUT# 1.7 - 7.7 K/uL 4.3     Lymph# 0.7 - 4.0 K/uL 1.6       Neurologic: Patient does not have protective sensation bilateral lower extremities.   MUSCULOSKELETAL:   Skin: Examination patient has a chronic sinus draining tract beneath the fourth metatarsal head.  Patient does have palpable pulses.  There is no ulcer beneath the first metatarsal head.  He has fixed clawing of all the toes.  Most recent hemoglobin A1c is 9.7.  Review of the MRI scan shows a septic MTP joint of the fourth toe with chronic destructive changes of the fourth metatarsal head.  There is also edema within the fifth metatarsal head consistent with osteomyelitis.  Assessment: Assessment: Diabetes with osteomyelitis of the fourth and fifth metatarsal heads with fixed clawing of the toes and a superficial left heel ulcer.  Plan: Recommended proceeding with a transmetatarsal amputation.  This should resolve the history of ulceration beneath the claw toes as well as treat the osteomyelitis of the fourth and fifth metatarsal heads.  Risks and benefits were discussed including risk of the wound not healing.  Need for additional surgery.  Patient states he understands wished to proceed at this time.  Plan for surgery on Wednesday.  Thank you for the consult and the opportunity to see Mr.  Tait Balistreri, [place] [phone] 8:18 AM

## 2022-05-17 NOTE — Progress Notes (Signed)
Pharmacy Antibiotic Note  Mike Floyd is a 61 y.o. male admitted on 05/13/2022 with cellulitis.  Pharmacy has been consulted for vancomycin dosing.  Steady state vancomycin peak and trough levels (VP, VT) drawn after dose given on 10/8  @ 14:55 PM.    VP =36 , VT= 14 on Vanc 1000 mg q12h, calcAUC 532.6, which is within therapeutic  goal AUC 400-550).  No vancomycin dose change needed. SCr 1.19  Plan: Continue Vancomycin 1000 mg IV every 12 hours.  Monitor labs, c/s, and vanco level as indicated.  Height: '6\' 1"'$  (185.4 cm) Weight: 96.6 kg (213 lb) IBW/kg (Calculated) : 79.9  Temp (24hrs), Avg:98.4 F (36.9 C), Min:98.1 F (36.7 C), Max:98.5 F (36.9 C)  Recent Labs  Lab 05/13/22 1207 05/13/22 1415 05/13/22 1425 05/14/22 0517 05/14/22 0810 05/15/22 0630 05/16/22 0615 05/16/22 0818 05/16/22 1641 05/17/22 0033  WBC 6.5  --  5.5 5.0  --  5.6  --  5.5  --  6.7  CREATININE 1.15  --  1.08 0.92  --  0.89 0.93  --   --  1.19  LATICACIDVEN 1.5 2.4*  --   --  1.1  --   --   --   --   --   VANCOTROUGH  --   --   --   --   --   --   --   --   --  14*  VANCOPEAK  --   --   --   --   --   --   --   --  36  --      Estimated Creatinine Clearance: 78.8 mL/min (by C-G formula based on SCr of 1.19 mg/dL).    No Known Allergies  Antimicrobials this admission: Vanco 10/5 >> CTX 10/5 >>  Microbiology results: 10/5 BCx: no growth to date x 4 days   Thank you for allowing pharmacy to be a part of this patient's care.  Nicole Cella, [place] Clinical Pharmacist [phone] 05/17/2022 12:17 PM

## 2022-05-17 NOTE — Progress Notes (Signed)
PROGRESS NOTE                                                                                                                                                                                                             Patient Demographics:    Mike Floyd, is a 61 y.o. male, DOB - 01-04-1961, FXT:[id number]  Outpatient Primary MD for the patient is Redmond School, MD    LOS - 4  Admit date - 05/13/2022    Chief Complaint  Patient presents with   Wound Check       Brief Narrative (HPI from H&P)  - 61 y.o. male with medical history significant for poorly controlled type 2 diabetes, GERD, hypertension, dyslipidemia, CAD, and neuropathy who presented to his PCP office with worsening pain, swelling, and redness to his left foot.  He was redirected here for admission for IV antibiotics due to concern for underlying osteomyelitis.  He is indeed noted to have osteomyelitis to his fourth and fifth metatarsal on MRI.  He was placed on IV antibiotics and transferred from [facility] for further care at [facility] under the Dr. Sharol Given.   Subjective:    [name] today has, No headache, No chest pain, No abdominal pain - No Nausea, No new weakness tingling or numbness, no SOB.Patient in bed, appears comfortable, denies any headache, no fever, no chest pain or pressure, no shortness of breath , no abdominal pain. No new focal weakness.   Assessment  & Plan :   Left foot acute osteomyelitis of the distal fourth metatarsal head and the 4th proximal phalanx in patient with diabetic foot ulcer -  Continue on IV vancomycin and Rocephin, follow blood cultures, discussed the case with Dr. Sharol Given who will see the patient likely operate on 05/19/2022.  ABI of the left foot stable.  CAD/dyslipidemia - Continue Coreg, aspirin and statin, chest pain-free with nonacute EKG, he says he had some heart issues in the past and required a defibrillator  placement which has since been removed, obtain echocardiogram as last echo in the file is 61 years old.   Diabetic neuropathy -  Continue home medications of Elavil and gabapentin.  GERD - Continue PPI and Carafate   BPH - Continue tamsulosin   Hypertension - for now continue Coreg, Imdur and Lasix held as blood pressure borderline.  As needed hydralazine on board as well.   DM type II.  In poor control due to hyperglycemia as suggested by A1c, diabetic and insulin education, on long-acting insulin and sliding scale monitor and adjust.   Lab Results  Component Value Date   HGBA1C 9.7 (H) 05/13/2022   CBG (last 3)  Recent Labs    05/16/22 1159 05/16/22 1615 05/16/22 2122  GLUCAP 156* 186* 182*        Condition - Extremely Guarded  Family Communication  : Called daughter Mike Floyd [phone] on 05/16/2022 at 9:25 AM -phone switched off  Code Status :  Full  Consults  :  Dr Sharol Given  PUD Prophylaxis : PPI   Procedures  :      TTE -   MRI L foot - 1. There is a plantar soft tissue ulcer at the level of the fourth metatarsal head and metatarsophalangeal joint with sinus tract extending to the fourth metatarsal head and metatarsophalangeal joint. There is cortical destruction and marrow edema indicating acute osteomyelitis of the distal fourth metatarsal head and the 4th proximal phalanx. The metatarsal edema extends all the way proximally to the proximal metaphysis. There is also a moderate to large fourth metatarsophalangeal joint effusion consistent with septic arthritis. 2. Additional marrow edema and probable cortical erosion within the proximal fifth metatarsal also concerning for osteomyelitis. Tiny fifth metatarsophalangeal joint effusion. Mild marrow edema within the plantar aspect of the fifth metatarsal head may be secondary to degenerative changes, however it is difficult to exclude early minimal osteomyelitis. 3. Chronic nonunited fracture of the proximal second  metatarsal.  ABI L Leg  - No evidence of hemodynamically significant lower extremity arterial occlusive disease at rest      Disposition Plan  :    Status is: Inpatient   DVT Prophylaxis  :    enoxaparin (LOVENOX) injection 40 mg Start: 05/13/22 2200    Lab Results  Component Value Date   PLT 187 05/17/2022    Diet :  Diet Order             Diet heart healthy/carb modified Room service appropriate? Yes; Fluid consistency: Thin  Diet effective now                    Inpatient Medications  Scheduled Meds:  amitriptyline  50 mg Oral QHS   aspirin EC  81 mg Oral Daily   atorvastatin  20 mg Oral q1800   calcium carbonate  500 mg of elemental calcium Oral Q breakfast   carvedilol  3.125 mg Oral BID WC   cyanocobalamin  1,000 mcg Oral Daily   enoxaparin (LOVENOX) injection  40 mg Subcutaneous Q24H   famotidine  20 mg Oral QHS   gabapentin  300 mg Oral TID   insulin aspart  0-20 Units Subcutaneous TID WC   insulin aspart  0-5 Units Subcutaneous QHS   insulin aspart protamine- aspart  50 Units Subcutaneous BID WC   LORazepam  1 mg Oral QHS   pantoprazole  40 mg Oral BID AC   tamsulosin  0.4 mg Oral Daily   Continuous Infusions:  cefTRIAXone (ROCEPHIN)  IV 2 g (05/16/22 1113)   vancomycin 1,000 mg (05/17/22 0333)   PRN Meds:.acetaminophen **OR** acetaminophen, nitroGLYCERIN, ondansetron **OR** ondansetron (ZOFRAN) IV, oxycodone    Objective:   Vitals:   05/16/22 1612 05/16/22 1934 05/17/22 0319 05/17/22 0756  BP: 117/77 137/83 132/89 110/77  Pulse: 99 (!) 104 90 92  Resp: 19 18  18 18  Temp: 98.1 F (36.7 C) 98.5 F (36.9 C) 98.3 F (36.8 C) 98.5 F (36.9 C)  TempSrc: Oral Oral Oral Oral  SpO2: 99% 99% 98% 100%  Weight:      Height:        Wt Readings from Last 3 Encounters:  05/13/22 96.6 kg  02/11/21 102.2 kg  01/09/21 100.7 kg     Intake/Output Summary (Last 24 hours) at 05/17/2022 0912 Last data filed at 05/16/2022 2200 Gross per 24 hour   Intake 120 ml  Output 1 ml  Net 119 ml     Physical Exam  Awake Alert, No new F.N deficits, Normal affect [place].AT,PERRAL Supple Neck, No JVD,   Symmetrical Chest wall movement, Good air movement bilaterally, CTAB RRR,No Gallops, Rubs or new Murmurs,  +ve B.Sounds, Abd Soft, No tenderness,   Left foot under bandage.    RN pressure injury documentation: Pressure Injury 05/13/22 Foot Left;Posterior;Lower Unstageable - Full thickness tissue loss in which the base of the injury is covered by slough (yellow, tan, gray, green or brown) and/or eschar (tan, brown or black) in the wound bed. (Active)  05/13/22 1700  Location: Foot  Location Orientation: Left;Posterior;Lower  Staging: Unstageable - Full thickness tissue loss in which the base of the injury is covered by slough (yellow, tan, gray, green or brown) and/or eschar (tan, brown or black) in the wound bed.  Wound Description (Comments):   Present on Admission: Yes  Dressing Type Gauze (Comment) 05/17/22 0555     Signature  Lala Lund M.D on 05/17/2022 at 9:12 AM   -  To page go to [email or web address]      Data Review:    CBC Recent Labs  Lab 05/13/22 1207 05/13/22 1425 05/14/22 0517 05/15/22 0630 05/16/22 0818 05/17/22 0033  WBC 6.5 5.5 5.0 5.6 5.5 6.7  HGB 14.3 12.2* 13.5 14.6 14.4 13.8  HCT 40.9 34.9* 38.3* 43.2 42.2 39.9  PLT 221 172 191 200 177 187  MCV 82.6 83.5 84.2 86.6 85.4 84.4  MCH 28.9 29.2 29.7 29.3 29.1 29.2  MCHC 35.0 35.0 35.2 33.8 34.1 34.6  RDW 13.7 13.7 13.7 14.1 13.8 13.5  LYMPHSABS 0.9  --   --   --   --  1.6  MONOABS 0.4  --   --   --   --  0.6  EOSABS 0.1  --   --   --   --  0.1  BASOSABS 0.0  --   --   --   --  0.1    Electrolytes Recent Labs  Lab 05/13/22 1207 05/13/22 1415 05/13/22 1425 05/14/22 0517 05/14/22 0810 05/15/22 0630 05/16/22 0615 05/16/22 0818 05/17/22 0033  NA 131*  --   --  139  --  140 139  --  137  K 4.6  --   --  3.6  --  3.1* 3.7  --  3.4*  CL 94*  --   --   105  --  103 108  --  105  CO2 26  --   --  26  --  26 25  --  24  GLUCOSE 469*  --   --  133*  --  98 79  --  161*  BUN 13  --   --  10  --  7* <5*  --  7*  CREATININE 1.15  --  1.08 0.92  --  0.89 0.93  --  1.19  CALCIUM 9.0  --   --  8.7*  --  8.7* 9.2  --  8.9  AST 14*  --   --   --   --   --   --   --   --   ALT 16  --   --   --   --   --   --   --   --   ALKPHOS 179*  --   --   --   --   --   --   --   --   BILITOT 1.9*  --   --   --   --   --   --   --   --   ALBUMIN 4.2  --   --   --   --   --   --   --   --   MG  --   --   --  1.8  --  2.1 1.9  --  1.8  CRP  --   --   --   --   --   --   --  0.6  --   PROCALCITON  --   --   --   --   --   --   --  <0.10 <0.10  LATICACIDVEN 1.5 2.4*  --   --  1.1  --   --   --   --   INR 1.1  --   --   --   --   --  1.2  --   --   HGBA1C  --   --  9.7*  --   --   --   --   --   --   BNP  --   --   --   --   --   --   --   --  20.8    Lab Results  Component Value Date   HGBA1C 9.7 (H) 05/13/2022    ID Labs Recent Labs  Lab 05/13/22 1207 05/13/22 1415 05/13/22 1425 05/14/22 0517 05/14/22 0810 05/15/22 0630 05/16/22 0615 05/16/22 0818 05/17/22 0033  WBC 6.5  --  5.5 5.0  --  5.6  --  5.5 6.7  PLT 221  --  172 191  --  200  --  177 187  CRP  --   --   --   --   --   --   --  0.6  --   PROCALCITON  --   --   --   --   --   --   --  <0.10 <0.10  LATICACIDVEN 1.5 2.4*  --   --  1.1  --   --   --   --   CREATININE 1.15  --  1.08 0.92  --  0.89 0.93  --  1.19    Radiology Reports ECHOCARDIOGRAM COMPLETE  Result Date: 05/16/2022    ECHOCARDIOGRAM REPORT   Patient Name:   JAQUA CHING Date of Exam: 05/16/2022 Medical Rec #:  [id number]     Height:       73.0 in Accession #:    [id number]    Weight:       213.0 lb Date of Birth:  October 08, 1960    BSA:          2.210 m Patient Age:    89 years      BP:           119/85 mmHg Patient Gender:  M             HR:           108 bpm. Exam Location:  Inpatient Procedure: 2D Echo, Cardiac Doppler,  Color Doppler and Intracardiac            Opacification Agent Indications:    Congestive Heart Failure  History:        Patient has prior history of Echocardiogram examinations, most                 recent 01/08/2019. Cardiomyopathy, Previous Myocardial Infarction                 and CAD, Defibrillator, PAD; Risk Factors:Former Smoker,                 Hypertension and Diabetes. Chronic kidney disease.  Sonographer:    Darlina Sicilian RDCS Referring Phys: Margaree Mackintosh [facility]  Sonographer Comments: Suboptimal apical window, no subcostal window and suboptimal parasternal window. IMPRESSIONS  1. Left ventricular ejection fraction, by estimation, is 50 to 55%. The left ventricle has low normal function. The left ventricle has no regional wall motion abnormalities. Left ventricular diastolic parameters are consistent with Grade I diastolic dysfunction (impaired relaxation).  2. Right ventricular systolic function is normal. The right ventricular size is normal. Tricuspid regurgitation signal is inadequate for assessing PA pressure.  3. The mitral valve is normal in structure. No evidence of mitral valve regurgitation.  4. The aortic valve is tricuspid. Aortic valve regurgitation is not visualized. No aortic stenosis is present.  5. Aortic dilatation noted. There is borderline dilatation of the aortic root, measuring 39 mm. Comparison(s): Prior images reviewed side by side. The left ventricular function has improved. FINDINGS  Left Ventricle: Left ventricular ejection fraction, by estimation, is 50 to 55%. The left ventricle has low normal function. The left ventricle has no regional wall motion abnormalities. Definity contrast agent was given IV to delineate the left ventricular endocardial borders. The left ventricular internal cavity size was normal in size. There is no left ventricular hypertrophy. Left ventricular diastolic parameters are consistent with Grade I diastolic dysfunction (impaired relaxation). Normal  left  ventricular filling pressure. Right Ventricle: The right ventricular size is normal. No increase in right ventricular wall thickness. Right ventricular systolic function is normal. Tricuspid regurgitation signal is inadequate for assessing PA pressure. Left Atrium: Left atrial size was normal in size. Right Atrium: Right atrial size was normal in size. Pericardium: There is no evidence of pericardial effusion. Mitral Valve: The mitral valve is normal in structure. No evidence of mitral valve regurgitation. Tricuspid Valve: The tricuspid valve is grossly normal. Tricuspid valve regurgitation is not demonstrated. Aortic Valve: The aortic valve is tricuspid. Aortic valve regurgitation is not visualized. No aortic stenosis is present. Pulmonic Valve: The pulmonic valve was not well visualized. Pulmonic valve regurgitation is not visualized. Aorta: Aortic dilatation noted. There is borderline dilatation of the aortic root, measuring 39 mm. Venous: The inferior vena cava was not well visualized. IAS/Shunts: The interatrial septum was not well visualized. Additional Comments: A is visualized in the right ventricle.  LEFT VENTRICLE PLAX 2D LVIDd:         4.60 cm     Diastology LVIDs:         2.70 cm     LV e' medial:    7.62 cm/s LV PW:         0.90 cm     LV E/e' medial:  11.4  LV IVS:        0.90 cm     LV e' lateral:   8.70 cm/s LVOT diam:     2.10 cm     LV E/e' lateral: 9.9 LV SV:         46 LV SV Index:   21 LVOT Area:     3.46 cm  LV Volumes (MOD) LV vol d, MOD A2C: 53.6 ml LV vol d, MOD A4C: 51.9 ml LV vol s, MOD A2C: 28.7 ml LV vol s, MOD A4C: 26.5 ml LV SV MOD A2C:     24.9 ml LV SV MOD A4C:     51.9 ml LV SV MOD BP:      24.2 ml RIGHT VENTRICLE RV S prime:     12.20 cm/s TAPSE (M-mode): 1.9 cm LEFT ATRIUM             Index LA diam:        3.30 cm 1.49 cm/m LA Vol (A2C):   24.2 ml 10.95 ml/m LA Vol (A4C):   32.2 ml 14.57 ml/m LA Biplane Vol: 29.7 ml 13.44 ml/m  AORTIC VALVE LVOT Vmax:   73.80 cm/s LVOT Vmean:   48.100 cm/s LVOT VTI:    0.133 m  AORTA Ao Root diam: 3.90 cm Ao Asc diam:  3.30 cm MITRAL VALVE MV Area (PHT): 3.37 cm     SHUNTS MV Decel Time: 225 msec     Systemic VTI:  0.13 m MV E velocity: 86.50 cm/s   Systemic Diam: 2.10 cm MV A velocity: 127.00 cm/s MV E/A ratio:  0.68 Mihai Croitoru MD Electronically signed by Sanda Klein MD Signature Date/Time: 05/16/2022/11:28:43 AM    Final    MR FOOT LEFT W WO CONTRAST  Result Date: 05/13/2022 CLINICAL DATA:  Foot swelling. Nondiabetic. Osteomyelitis suspected. Lytic destruction involving the distal portion of fourth metatarsal and proximal base of the fourth proximal phalanx concerning for osteomyelitis. EXAM: MRI OF THE LEFT FOREFOOT WITHOUT AND WITH CONTRAST TECHNIQUE: Multiplanar, multisequence MR imaging of the left was performed both before and after administration of intravenous contrast. CONTRAST:  9 ml Vueway gadolinium IV COMPARISON:  Left foot radiographs 05/13/2022 and 10/06/2021 FINDINGS: Bones/Joint/Cartilage There is a plantar soft tissue ulcer and sinus tract (described below) extending to the fourth metatarsal head and metatarsophalangeal joint. There is moderate cortical erosion and fragmentation of the fourth metatarsal head, with multiple apparent fracture line lucencies (sagittal image 22) and approximately 3 mm dorsal cortical step-off of the distal aspect of the fourth metatarsal head with respect to the fourth metatarsal neck. There is also fragmentation of the proximal phalanx of the great toe. Moderate to high-grade fourth metatarsophalangeal joint effusion. There is high-grade marrow edema throughout the entire proximal phalanx of the fourth toe and the distal aspect of the fourth metatarsal extending all the way proximally to the proximal metaphysis of the fourth metatarsal (sagittal image 22). Possible marrow edema within portions of the fourth toe middle and distal phalanges, however evaluation is limited by patient motion artifact  and the small size of these joints. There also associated degenerative changes of the interphalangeal joints there may be a cause for subchondral marrow edema. There is marrow edema within the proximal 40% of the proximal phalanx of the fifth digit with proximal cortical erosion concerning for osteomyelitis. Small fifth metatarsophalangeal joint effusion. Mild marrow edema within the plantar aspect of the fifth metatarsal head. No definitive cortical destruction within the fifth metatarsal head, and this could  be degenerative edema, however osteomyelitis cannot be excluded. There is curvilinear fluid bright signal across a nonunited chronic appearing fracture of the proximal metaphysis of the second metatarsal, also seen on prior radiographs with high-grade surrounding callus formation. 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 Subchondral cystic change and surrounding marrow edema within the lateral cuneiform at the third tarsometatarsal joint. Ligaments There is metallic artifact related to the previously seen surgical staple fixating the first tarsometatarsal joint. There is scattered metallic artifact across the medial cuneiform-base of the second metatarsal Lisfranc ligament complex. Muscles and Tendons The visualized second extensor tendons appear intact. There is diffuse fatty infiltration of the visualized midfoot and forefoot musculature. Soft tissues There is an ulceration within the forefoot soft tissues plantar to the fourth metatarsal head and metatarsophalangeal joint, measuring up to 13 mm in transverse dimension and 15 mm in AP dimension (series 10, image 20 and series 9, image 22). There is a sinus tract extending from this region directly to the distal fourth metatarsal head and metatarsophalangeal joint. Moderate edema and swelling of the dorsal midfoot subcutaneous fat. IMPRESSION: 1. There is a plantar soft tissue ulcer at the level of the fourth metatarsal head and  metatarsophalangeal joint with sinus tract extending to the fourth metatarsal head and metatarsophalangeal joint. There is cortical destruction and marrow edema indicating acute osteomyelitis of the distal fourth metatarsal head and the 4th proximal phalanx. The metatarsal edema extends all the way proximally to the proximal metaphysis. There is also a moderate to large fourth metatarsophalangeal joint effusion consistent with septic arthritis. 2. Additional marrow edema and probable cortical erosion within the proximal fifth metatarsal also concerning for osteomyelitis. Tiny fifth metatarsophalangeal joint effusion. Mild marrow edema within the plantar aspect of the fifth metatarsal head may be secondary to degenerative changes, however it is difficult to exclude early minimal osteomyelitis. 3. Chronic nonunited fracture of the proximal second metatarsal. Electronically Signed   By: Yvonne Kendall M.D.   On: 05/13/2022 19:10   US ARTERIAL ABI (SCREENING LOWER EXTREMITY)  Result Date: 05/13/2022 CLINICAL DATA:  Peripheral vascular disease EXAM: NONINVASIVE PHYSIOLOGIC VASCULAR STUDY OF BILATERAL LOWER EXTREMITIES TECHNIQUE: Evaluation of both lower extremities were performed at rest, including calculation of ankle-brachial indices with single level Doppler, pressure and pulse volume recording. COMPARISON:  06/14/2019 FINDINGS: Right ABI:  1.27 Left ABI:  1.24 Right Lower Extremity: Multiphasic arterial waveform in the posterior tibial artery. Monophasic in dorsalis pedis. Left Lower Extremity: Multiphasic posterior tibial waveform. Monophasic dorsalis pedis. IMPRESSION: No evidence of hemodynamically significant lower extremity arterial occlusive disease at rest. Electronically Signed   By: Lucrezia Europe M.D.   On: 05/13/2022 14:49   DG Foot Complete Left  Result Date: 05/13/2022 CLINICAL DATA:  Left foot wound. EXAM: LEFT FOOT - COMPLETE 3+ VIEW COMPARISON:  October 06, 2021. FINDINGS: Status post surgical  fusion of the first tarsometatarsal joint. There appears to be lytic destruction involving the distal portion of the fourth metatarsal and the proximal base of the fourth proximal phalanx concerning for osteomyelitis. Mild posterior calcaneal spurring is noted. IMPRESSION: Lytic destruction involving distal portion of fourth metatarsal and proximal base of fourth proximal phalanx concerning for osteomyelitis. Electronically Signed   By: Marijo Conception M.D.   On: 05/13/2022 12:49   DG Chest Port 1 View  Result Date: 05/13/2022 CLINICAL DATA:  Sepsis. EXAM: PORTABLE CHEST 1 VIEW COMPARISON:  Dec 29, 2017. FINDINGS: The heart size and mediastinal contours are within normal limits. Both lungs  are clear. The visualized skeletal structures are unremarkable. IMPRESSION: No active disease. Electronically Signed   By: Marijo Conception M.D.   On: 05/13/2022 12:47

## 2022-05-17 NOTE — H&P (View-Only) (Signed)
ORTHOPAEDIC CONSULTATION  REQUESTING PHYSICIAN: Thurnell Lose, MD  Chief Complaint: Ulceration plantar aspect left foot.  HPI: Mike Floyd is a 61 y.o. male who presents with history of diabetes with ulceration left foot.  Patient had previously undergone gastrocnemius recession and dorsal closing wedge osteotomy to resolve an ulcer beneath the first metatarsal head left foot.  This has not reoccurred.  Patient presents with a chronic draining ulcer beneath the fourth metatarsal head and a pressure ulcer on the left heel.  Patient's recent vascular studies with radiology shows no flow restrictions to either lower extremity.  Past Medical History:  Diagnosis Date   AICD (automatic cardioverter/defibrillator) present 11/2007   a. 11/2007 S[facility] Current VR - single lead ICD  - Removed 2018 - "it was burning me"   Anxiety    CAD (coronary artery disease)    non-obstructive CAD by Cor CT in 2020 // Myoview 3/22: EF 57, small inf-sept defect c/w scar, no ischemia; low risk      Chest pain    a. 10/2007 Cath:  normal Cors.   CKD (chronic kidney disease), stage II    DDD (degenerative disc disease), lumbar    Diabetes mellitus DX: 2010   Erosive esophagitis    a. per EGD (08/2011), Dr. Laural Golden - Erosive reflux esophagitis improved but not completely healed since previous EGD 3 years ago. Bx showing  ulcerated gatroesophageal junction mucosa. negative for H. pylori   GERD (gastroesophageal reflux disease)    Gout    Hearing deficit    a. wear bilateral hearing aides   History of hiatal hernia    Hypertension    Mildly dilatd aortic root ([place])    CMR 4/22: EF 52, no LGE; d/w Dr. Marlowe Kays root 38 mm (mildly dilated)   Myocardial infarction [facility]) 2011   Neuropathy    Feet and legs   Nonischemic dilated cardiomyopathy ([place])    a. H/O EF as low as 35-40% by LV gram 10/2007;  b. Echo 02/2011 EF 50-55%, inf HK, Gr 1 DD // CMR 4/22: EF 52, no LGE; d/w Dr. Marlowe Kays root 38 mm (mildly  dilated)    Renal insufficiency    Sleep apnea    pt doesnt use, states"I cant afford one". PCP aware   Stroke ([place])    mini-stroke in 2014   TIA (transient ischemic attack)    July, 2013   Tobacco abuse, in remission 06/27/2009   Discontinued in 2009     Wears dentures    top plate   WPW (Wolff-Parkinson-White syndrome)    a. s/p RFCA @ [facility]   Past Surgical History:  Procedure Laterality Date   [place]     2009   [place], LAPAROSCOPIC     11/2007   COLONOSCOPY W/ POLYPECTOMY  2009   ELBOW SURGERY Left 06/2010   ESOPHAGEAL DILATION N/A 11/08/2014   Procedure: ESOPHAGEAL DILATION;  Surgeon: Rogene Houston, MD;  Location: [facility];  Service: Endoscopy;  Laterality: N/A;  #56,    ESOPHAGOGASTRODUODENOSCOPY  03/31/2012   also 08/2011; [name]   ESOPHAGOGASTRODUODENOSCOPY (EGD) WITH PROPOFOL N/A 11/08/2014   Procedure: ESOPHAGOGASTRODUODENOSCOPY (EGD) WITH PROPOFOL;  Surgeon: Rogene Houston, MD;  Location: [facility];  Service: Endoscopy;  Laterality: N/A;  [place] is 39 , [place] is 37   FOOT ARTHRODESIS  Left 10/24/2020   Procedure: LEFT GASTROCNEMIUS RECESSION, DORSIFLEXION OSTEOTOMY 1ST MT;  Surgeon: [name], MD;  Location: [place];  Service: Orthopedics;  Laterality: Left;   FOOT ARTHRODESIS Right 01/09/2021   Procedure: CLOSING WEDGE OSTEOTOMY RIGHT 1ST METATARSAL;  Surgeon: [name], MD;  Location: [place];  Service: Orthopedics;  Laterality: Right;   GASTROCNEMIUS RECESSION Right 01/09/2021   Procedure: RIGHT GASTROCNEMIUS RECESSION;  Surgeon: [name], MD;  Location: [place];  Service: Orthopedics;  Laterality: Right;   ICD LEAD REMOVAL N/A 10/07/2016   Procedure: ICD LEAD REMOVAL ;  Surgeon: Evans Lance, MD;  Location: [place];  Service: Cardiovascular;  Laterality: N/A;   [place];  performed at [facility]   TEE WITHOUT CARDIOVERSION N/A 10/07/2016   Procedure: TRANSESOPHAGEAL ECHOCARDIOGRAM (TEE);  Surgeon: Evans Lance, MD;  Location: [place];  Service: Cardiovascular;  Laterality: N/A;   Social History   Socioeconomic History   Marital status: Married    Spouse name: Not on file   Number of children: Not on file   Years of education: Not on file   Highest education level: Not on file  Occupational History   Occupation: Worked for [employer]    Employer: [employer]    Comment: Laid off in 11/11  Tobacco Use   Smoking status: Former    Packs/day: 1.50    Years: 35.00    Total pack years: 52.50    Types: Cigarettes    Start date: 06/30/1971    Quit date: 07/30/2010    Years since quitting: 11.8   Smokeless tobacco: Former    Types: Chew    Quit date: 03/26/1994  Vaping Use   Vaping Use: Former   Start date: 07/30/2010   Quit date: 06/29/2016  Substance and Sexual Activity   Alcohol use: No    Alcohol/week: 1.0 standard drink of alcohol    Types: 1 Cans of beer per week    Comment: used "years ago"   Drug use: No   Sexual activity: Never    Birth control/protection: None  Other Topics Concern   Not on file  Social History Narrative   Not on file   Social Determinants of Health   Financial Resource Strain: Not on file  Food Insecurity: No Food Insecurity (05/13/2022)   Hunger Vital Sign    Worried About Running Out of Food in the Last Year: Never true    Friendship in the Last Year: Never true  Transportation Needs: No Transportation Needs (05/13/2022)   PRAPARE - Hydrologist (Medical): No    Lack of Transportation (Non-Medical): No  Physical Activity: Not on file  Stress: Not on file  Social Connections: Not on file   Family History  Problem Relation Age of Onset   Heart attack Mother 25   Hypertension Mother    Diabetes Mother    Kidney disease Mother    Breast cancer Mother    Heart attack Father  42   Hypertension Father    Diabetes Father    Stroke Brother    Heart attack Brother 79   Stroke Maternal Grandmother 61   Diabetes Brother    Hypertension Brother    - negative except otherwise stated in the family history section No Known Allergies Prior to Admission medications   Medication Sig Start Date End Date Taking? Authorizing Provider  amitriptyline (  ELAVIL) 50 MG tablet Take 50 mg by mouth at bedtime. 12/04/13  Yes [provider]  aspirin 81 MG tablet Take 81 mg by mouth daily.   Yes [provider]  atorvastatin (LIPITOR) 20 MG tablet Take 20 mg by mouth daily.   Yes [provider]  calcium carbonate (OSCAL) 1500 (600 Ca) MG TABS tablet Take 600 mg of elemental calcium by mouth daily with breakfast.   Yes [provider]  carvedilol (COREG) 12.5 MG tablet TAKE 1 TABLET BY MOUTH TWICE DAILY. 03/18/22  Yes Josue Hector, MD  famotidine (PEPCID) 20 MG tablet Take 20 mg by mouth daily.   Yes [provider]  furosemide (LASIX) 40 MG tablet TAKE (1) TABLET BY MOUTH ONCE DAILY. Patient taking differently: Take 40 mg by mouth daily. 08/06/19  Yes Herminio Commons, MD  gabapentin (NEURONTIN) 600 MG tablet Take 300 mg by mouth 3 (three) times daily. 09/05/20  Yes [provider]  isosorbide mononitrate (IMDUR) 30 MG 24 hr tablet TAKE (1) TABLET BY MOUTH ONCE DAILY. Patient taking differently: Take 30 mg by mouth daily. 01/28/20  Yes Herminio Commons, MD  LORazepam (ATIVAN) 1 MG tablet Take 1 mg by mouth at bedtime.   Yes [provider]  nitroGLYCERIN (NITROSTAT) 0.4 MG SL tablet Place 0.4 mg under the tongue every 5 (five) minutes as needed for chest pain.   Yes [provider]  NOVOLOG MIX 70/30 FLEXPEN (70-30) 100 UNIT/ML FlexPen Inject 50 Units into the skin 2 (two) times daily. 03/05/22  Yes [provider]  oxycodone (ROXICODONE) 30 MG immediate release tablet Take 30-60 mg by mouth 4 (four)  times daily as needed for pain. 12/16/20  Yes [provider]  pantoprazole (PROTONIX) 40 MG tablet Take 40 mg by mouth 2 (two) times daily. 07/29/21  Yes [provider]  potassium chloride SA (KLOR-CON M) 20 MEQ tablet TAKE (1) TABLET BY MOUTH ONCE DAILY. 10/14/21  Yes Josue Hector, MD  tamsulosin (FLOMAX) 0.4 MG CAPS capsule Take 0.4 mg by mouth daily. am   Yes [provider]  vitamin B-12 (CYANOCOBALAMIN) 1000 MCG tablet Take 1,000 mcg by mouth daily.   Yes [provider]  acetaminophen-codeine (TYLENOL #3) 300-30 MG tablet Take 1-2 tablets by mouth every 4 (four) hours as needed for moderate pain. Patient not taking: Reported on 05/13/2022 03/05/22   Felipa Furnace, DPM  acetaminophen-codeine (TYLENOL #3) 300-30 MG tablet Take 1-2 tablets by mouth every 4 (four) hours as needed for moderate pain. Patient not taking: Reported on 05/13/2022 03/12/22   Felipa Furnace, DPM  amoxicillin-clavulanate (AUGMENTIN) 875-125 MG tablet Take 1 tablet by mouth 2 (two) times daily. Patient not taking: Reported on 05/13/2022 10/06/21   Tyson Dense T, DPM  B-D ULTRAFINE III SHORT PEN 31G X 8 MM MISC USE WITH TRESBIA. 10/27/17   Cassandria Anger, MD  clotrimazole-betamethasone (LOTRISONE) cream Apply topically 2 (two) times daily. Patient not taking: Reported on 05/13/2022 08/31/21   [provider]  insulin degludec (TRESIBA FLEXTOUCH) 100 UNIT/ML SOPN FlexTouch Pen Inject 0.3 mLs (30 Units total) into the skin at bedtime. Patient not taking: Reported on 05/13/2022 10/27/17   Cassandria Anger, MD  mupirocin ointment (BACTROBAN) 2 % Apply 1 application topically 2 (two) times daily. Patient not taking: Reported on 05/13/2022 10/06/21   Hyatt, Max T, DPM  omeprazole (PRILOSEC) 40 MG capsule TAKE (1) CAPSULE BY MOUTH ONCE DAILY. Patient not taking: Reported on  05/13/2022 01/14/21   Rogene Houston, MD  ONETOUCH VERIO test strip TESTING 4 TIMES DAILY. 12/25/18   Cassandria Anger, MD  SSD 1 % cream APPLY TO AFFECTED AREA DAILY AS DIRECTED. Patient not taking: Reported on 05/13/2022 06/11/21   [name], MD  sucralfate (CARAFATE) 1 g tablet TAKE 1 TABLET BY MOUTH 30 MINTUES BEFORE MEALS AND AT BEDTIME. Patient not taking: Reported on 05/13/2022 06/03/20   Ezzard Standing, PA-C   ECHOCARDIOGRAM COMPLETE  Result Date: 05/16/2022    ECHOCARDIOGRAM REPORT   Patient Name:   DORN HARTSHORNE Date of Exam: 05/16/2022 Medical Rec #:  [id number]     Height:       73.0 in Accession #:    [id number]    Weight:       213.0 lb Date of Birth:  Oct 24, 1960    BSA:          2.210 m Patient Age:    47 years      BP:           119/85 mmHg Patient Gender: M             HR:           108 bpm. Exam Location:  Inpatient Procedure: 2D Echo, Cardiac Doppler, Color Doppler and Intracardiac            Opacification Agent Indications:    Congestive Heart Failure  History:        Patient has prior history of Echocardiogram examinations, most                 recent 01/08/2019. Cardiomyopathy, Previous Myocardial Infarction                 and CAD, Defibrillator, PAD; Risk Factors:Former Smoker,                 Hypertension and Diabetes. Chronic kidney disease.  Sonographer:    Darlina Sicilian RDCS Referring Phys: Margaree Mackintosh [facility]  Sonographer Comments: Suboptimal apical window, no subcostal window and suboptimal parasternal window. IMPRESSIONS  1. Left ventricular ejection fraction, by estimation, is 50 to 55%. The left ventricle has low normal function. The left ventricle has no regional wall motion abnormalities. Left ventricular diastolic parameters are consistent with Grade I diastolic dysfunction (impaired relaxation).  2. Right ventricular systolic function is normal. The right ventricular size is normal. Tricuspid regurgitation signal is inadequate for assessing PA pressure.  3. The mitral valve is normal in structure. No evidence of mitral valve regurgitation.  4. The aortic valve is  tricuspid. Aortic valve regurgitation is not visualized. No aortic stenosis is present.  5. Aortic dilatation noted. There is borderline dilatation of the aortic root, measuring 39 mm. Comparison(s): Prior images reviewed side by side. The left ventricular function has improved. FINDINGS  Left Ventricle: Left ventricular ejection fraction, by estimation, is 50 to 55%. The left ventricle has low normal function. The left ventricle has no regional wall motion abnormalities. Definity contrast agent was given IV to delineate the left ventricular endocardial borders. The left ventricular internal cavity size was normal in size. There is no left ventricular hypertrophy. Left ventricular diastolic parameters are consistent with Grade I diastolic dysfunction (impaired relaxation). Normal  left ventricular filling pressure. Right Ventricle: The right ventricular size is normal. No increase in right ventricular wall thickness. Right ventricular systolic function is normal. Tricuspid regurgitation signal is inadequate for assessing PA pressure. Left Atrium: Left atrial size  was normal in size. Right Atrium: Right atrial size was normal in size. Pericardium: There is no evidence of pericardial effusion. Mitral Valve: The mitral valve is normal in structure. No evidence of mitral valve regurgitation. Tricuspid Valve: The tricuspid valve is grossly normal. Tricuspid valve regurgitation is not demonstrated. Aortic Valve: The aortic valve is tricuspid. Aortic valve regurgitation is not visualized. No aortic stenosis is present. Pulmonic Valve: The pulmonic valve was not well visualized. Pulmonic valve regurgitation is not visualized. Aorta: Aortic dilatation noted. There is borderline dilatation of the aortic root, measuring 39 mm. Venous: The inferior vena cava was not well visualized. IAS/Shunts: The interatrial septum was not well visualized. Additional Comments: A is visualized in the right ventricle.  LEFT VENTRICLE PLAX 2D  LVIDd:         4.60 cm     Diastology LVIDs:         2.70 cm     LV e' medial:    7.62 cm/s LV PW:         0.90 cm     LV E/e' medial:  11.4 LV IVS:        0.90 cm     LV e' lateral:   8.70 cm/s LVOT diam:     2.10 cm     LV E/e' lateral: 9.9 LV SV:         46 LV SV Index:   21 LVOT Area:     3.46 cm  LV Volumes (MOD) LV vol d, MOD A2C: 53.6 ml LV vol d, MOD A4C: 51.9 ml LV vol s, MOD A2C: 28.7 ml LV vol s, MOD A4C: 26.5 ml LV SV MOD A2C:     24.9 ml LV SV MOD A4C:     51.9 ml LV SV MOD BP:      24.2 ml RIGHT VENTRICLE RV S prime:     12.20 cm/s TAPSE (M-mode): 1.9 cm LEFT ATRIUM             Index LA diam:        3.30 cm 1.49 cm/m LA Vol (A2C):   24.2 ml 10.95 ml/m LA Vol (A4C):   32.2 ml 14.57 ml/m LA Biplane Vol: 29.7 ml 13.44 ml/m  AORTIC VALVE LVOT Vmax:   73.80 cm/s LVOT Vmean:  48.100 cm/s LVOT VTI:    0.133 m  AORTA Ao Root diam: 3.90 cm Ao Asc diam:  3.30 cm MITRAL VALVE MV Area (PHT): 3.37 cm     SHUNTS MV Decel Time: 225 msec     Systemic VTI:  0.13 m MV E velocity: 86.50 cm/s   Systemic Diam: 2.10 cm MV A velocity: 127.00 cm/s MV E/A ratio:  0.68 Mihai Croitoru MD Electronically signed by Sanda Klein MD Signature Date/Time: 05/16/2022/11:28:43 AM    Final    - pertinent xrays, CT, MRI studies were reviewed and independently interpreted  Positive ROS: All other systems have been reviewed and were otherwise negative with the exception of those mentioned in the HPI and as above.  Physical Exam: General: Alert, no acute distress Psychiatric: Patient is competent for consent with normal mood and affect Lymphatic: No axillary or cervical lymphadenopathy Cardiovascular: No pedal edema Respiratory: No cyanosis, no use of accessory musculature GI: No organomegaly, abdomen is soft and non-tender    Images:  '@ENCIMAGES'$ @  Labs:  Lab Results  Component Value Date   HGBA1C 9.7 (H) 05/13/2022   HGBA1C 7.0 (H) 06/15/2017   HGBA1C 5.2 03/15/2013  CRP 0.6 05/16/2022   CRP 2.2 06/27/2019    LABURIC 5.5 03/07/2012   REPTSTATUS PENDING 05/13/2022   CULT  05/13/2022    NO GROWTH 4 DAYS Performed at [facility], [address], [place], [place] [address]     Lab Results  Component Value Date   ALBUMIN 4.2 05/13/2022   ALBUMIN 4.6 12/27/2013   ALBUMIN 4.4 05/30/2013   LABURIC 5.5 03/07/2012        Latest Ref Rng & Units 05/17/2022   12:33 AM 05/16/2022    8:18 AM 05/15/2022    6:30 AM  CBC EXTENDED  WBC 4.0 - 10.5 K/uL 6.7  5.5  5.6   RBC 4.22 - 5.81 MIL/uL 4.73  4.94  4.99   Hemoglobin 13.0 - 17.0 g/dL 13.8  14.4  14.6   HCT 39.0 - 52.0 % 39.9  42.2  43.2   Platelets 150 - 400 K/uL 187  177  200   NEUT# 1.7 - 7.7 K/uL 4.3     Lymph# 0.7 - 4.0 K/uL 1.6       Neurologic: Patient does not have protective sensation bilateral lower extremities.   MUSCULOSKELETAL:   Skin: Examination patient has a chronic sinus draining tract beneath the fourth metatarsal head.  Patient does have palpable pulses.  There is no ulcer beneath the first metatarsal head.  He has fixed clawing of all the toes.  Most recent hemoglobin A1c is 9.7.  Review of the MRI scan shows a septic MTP joint of the fourth toe with chronic destructive changes of the fourth metatarsal head.  There is also edema within the fifth metatarsal head consistent with osteomyelitis.  Assessment: Assessment: Diabetes with osteomyelitis of the fourth and fifth metatarsal heads with fixed clawing of the toes and a superficial left heel ulcer.  Plan: Recommended proceeding with a transmetatarsal amputation.  This should resolve the history of ulceration beneath the claw toes as well as treat the osteomyelitis of the fourth and fifth metatarsal heads.  Risks and benefits were discussed including risk of the wound not healing.  Need for additional surgery.  Patient states he understands wished to proceed at this time.  Plan for surgery on Wednesday.  Thank you for the consult and the opportunity to see Mr.  Bartosz Luginbill, [place] [phone] 8:18 AM

## 2022-05-17 NOTE — Inpatient Diabetes Management (Signed)
Inpatient Diabetes Program Recommendations  AACE/ADA: New Consensus Statement on Inpatient Glycemic Control (2015)  Target Ranges:  Prepandial:   less than 140 mg/dL      Peak postprandial:   less than 180 mg/dL (1-2 hours)      Critically ill patients:  140 - 180 mg/dL   Lab Results  Component Value Date   GLUCAP 104 (H) 05/17/2022   HGBA1C 9.7 (H) 05/13/2022    Review of Glycemic Control  Diabetes history: type 2 Outpatient Diabetes medications: 70/30 insulin 50 units BID Current orders for Inpatient glycemic control: 70/30 50 units BID, Novolog RESISTANT correction scale TID & HS scale  Inpatient Diabetes Program Recommendations:   Spoke briefly to patient at the bedside. A diabetes coordinator spoke with this patient on 10/6 about general diabetes care and A1C. Patient states that he has 70/30 insulin at home and has not had any trouble getting it financially. He lives with his wife. He will be having foot surgery on 10/11.   Will continue to monitor blood sugars while in the hospital.  Harvel Ricks RN BSN CDE Diabetes Coordinator Pager: [phone]  8am-5pm

## 2022-05-18 LAB — CBC WITH DIFFERENTIAL/PLATELET
Abs Immature Granulocytes: 0.04 10*3/uL (ref 0.00–0.07)
Basophils Absolute: 0.1 10*3/uL (ref 0.0–0.1)
Basophils Relative: 1 %
Eosinophils Absolute: 0.1 10*3/uL (ref 0.0–0.5)
Eosinophils Relative: 2 %
HCT: 42.2 % (ref 39.0–52.0)
Hemoglobin: 15 g/dL (ref 13.0–17.0)
Immature Granulocytes: 1 %
Lymphocytes Relative: 21 %
Lymphs Abs: 1.6 10*3/uL (ref 0.7–4.0)
MCH: 29.1 pg (ref 26.0–34.0)
MCHC: 35.5 g/dL (ref 30.0–36.0)
MCV: 81.9 fL (ref 80.0–100.0)
Monocytes Absolute: 0.7 10*3/uL (ref 0.1–1.0)
Monocytes Relative: 10 %
Neutro Abs: 4.9 10*3/uL (ref 1.7–7.7)
Neutrophils Relative %: 65 %
Platelets: 186 10*3/uL (ref 150–400)
RBC: 5.15 MIL/uL (ref 4.22–5.81)
RDW: 13.7 % (ref 11.5–15.5)
WBC: 7.5 10*3/uL (ref 4.0–10.5)
nRBC: 0 % (ref 0.0–0.2)

## 2022-05-18 LAB — CULTURE, BLOOD (ROUTINE X 2)
Culture: NO GROWTH
Culture: NO GROWTH

## 2022-05-18 LAB — GLUCOSE, CAPILLARY
Glucose-Capillary: 144 mg/dL — ABNORMAL HIGH (ref 70–99)
Glucose-Capillary: 207 mg/dL — ABNORMAL HIGH (ref 70–99)
Glucose-Capillary: 109 mg/dL — ABNORMAL HIGH (ref 70–99)
Glucose-Capillary: 174 mg/dL — ABNORMAL HIGH (ref 70–99)

## 2022-05-18 LAB — BASIC METABOLIC PANEL
Anion gap: 11 (ref 5–15)
BUN: 8 mg/dL (ref 8–23)
CO2: 23 mmol/L (ref 22–32)
Calcium: 9.1 mg/dL (ref 8.9–10.3)
Chloride: 103 mmol/L (ref 98–111)
Creatinine, Ser: 1.22 mg/dL (ref 0.61–1.24)
GFR, Estimated: >60 mL/min (ref >60)
Glucose, Bld: 81 mg/dL (ref 70–99)
Potassium: 3.3 mmol/L — ABNORMAL LOW (ref 3.5–5.1)
Sodium: 137 mmol/L (ref 135–145)

## 2022-05-18 LAB — MAGNESIUM: Magnesium: 1.9 mg/dL (ref 1.7–2.4)

## 2022-05-18 LAB — PROCALCITONIN: Procalcitonin: <0.1 ng/mL

## 2022-05-18 LAB — BRAIN NATRIURETIC PEPTIDE: B Natriuretic Peptide: 11.6 pg/mL (ref 0.0–100.0)

## 2022-05-18 MED ORDER — INSULIN ASPART PROT & ASPART (70-30 MIX) 100 UNIT/ML ~~LOC~~ SUSP
50.0000 [IU] | Freq: Two times a day (BID) | SUBCUTANEOUS | Status: AC
  Administered 2022-05-18: 50 [IU] via SUBCUTANEOUS

## 2022-05-18 MED ORDER — KCL IN DEXTROSE-NACL 10-5-0.45 MEQ/L-%-% IV SOLN
INTRAVENOUS | Status: DC
  Filled 2022-05-18: qty 1000

## 2022-05-18 MED ORDER — INSULIN GLARGINE-YFGN 100 UNIT/ML ~~LOC~~ SOLN
15.0000 [IU] | Freq: Once | SUBCUTANEOUS | Status: DC
  Filled 2022-05-18: qty 0.15

## 2022-05-18 MED ORDER — INSULIN ASPART PROT & ASPART (70-30 MIX) 100 UNIT/ML ~~LOC~~ SUSP
50.0000 [IU] | Freq: Two times a day (BID) | SUBCUTANEOUS | Status: DC
  Filled 2022-05-18: qty 10

## 2022-05-18 MED ORDER — INSULIN ASPART PROT & ASPART (70-30 MIX) 100 UNIT/ML ~~LOC~~ SUSP
50.0000 [IU] | Freq: Two times a day (BID) | SUBCUTANEOUS | Status: DC
  Administered 2022-05-18: 50 [IU] via SUBCUTANEOUS
  Filled 2022-05-18: qty 10

## 2022-05-18 NOTE — Progress Notes (Signed)
PROGRESS NOTE                                                                                                                                                                                                             Patient Demographics:    Mike Floyd, is a 61 y.o. male, DOB - 10/19/1960, EVO:[id number]  Outpatient Primary MD for the patient is Redmond School, MD    LOS - 5  Admit date - 05/13/2022    Chief Complaint  Patient presents with   Wound Check       Brief Narrative (HPI from H&P)  - 61 y.o. male with medical history significant for poorly controlled type 2 diabetes, GERD, hypertension, dyslipidemia, CAD, and neuropathy who presented to his PCP office with worsening pain, swelling, and redness to his left foot.  He was redirected here for admission for IV antibiotics due to concern for underlying osteomyelitis.  He is indeed noted to have osteomyelitis to his fourth and fifth metatarsal on MRI.  He was placed on IV antibiotics and transferred from [facility] for further care at [facility] under the Dr. Sharol Given.   Subjective:   Patient in bed, appears comfortable, denies any headache, no fever, no chest pain or pressure, no shortness of breath , no abdominal pain. No focal weakness.   Assessment  & Plan :   Left foot acute osteomyelitis of the distal fourth metatarsal head and the 4th proximal phalanx in patient with diabetic foot ulcer -  Continue on IV vancomycin and Rocephin, follow blood cultures, discussed the case with Dr. Sharol Given who will see the patient likely operate on 05/19/2022.  ABI of the left foot stable.  He is agreeable for blood transfusion during the perioperative period.  CAD/dyslipidemia - Continue Coreg, aspirin and statin, chest pain-free with nonacute EKG, he says he had some heart issues in the past and required a defibrillator placement which has since been removed, cardiogram this  admission stable with improved EF of 55% and no wall motion abnormality.   Diabetic neuropathy -  Continue home medications of Elavil and gabapentin.  GERD - Continue PPI and Carafate   BPH - Continue tamsulosin   Hypertension - for now continue Coreg, Imdur and Lasix held as blood pressure borderline.  As needed hydralazine on board as well.   DM  type II.  In poor control due to hyperglycemia as suggested by A1c, diabetic and insulin education, on long-acting insulin and sliding scale monitor and adjust.  He will be n.p.o. after midnight on 05/18/2022, insulin dosages adjusted and gentle D5 ordered for early morning of 05/19/2022.   Lab Results  Component Value Date   HGBA1C 9.7 (H) 05/13/2022   CBG (last 3)  Recent Labs    05/17/22 1620 05/17/22 2055 05/18/22 0746  GLUCAP 147* 240* 144*        Condition - Extremely Guarded  Family Communication  : Called daughter Lynelle Smoke [phone] on 05/16/2022 at 9:25 AM -phone switched off  Code Status :  Full  Consults  :  Dr Sharol Given  PUD Prophylaxis : PPI   Procedures  :      TTE - 1. Left ventricular ejection fraction, by estimation, is 50 to 55%. The left ventricle has low normal function. The left ventricle has no regional wall motion abnormalities. Left ventricular diastolic parameters are consistent with Grade I diastolic dysfunction (impaired relaxation).  2. Right ventricular systolic function is normal. The right ventricular size is normal. Tricuspid regurgitation signal is inadequate for assessing PA pressure.  3. The mitral valve is normal in structure. No evidence of mitral valve regurgitation.  4. The aortic valve is tricuspid. Aortic valve regurgitation is not visualized. No aortic stenosis is present.  5. Aortic dilatation noted. There is borderline dilatation of the aortic root, measuring 39 mm. Comparison(s): Prior images reviewed side by side. The left ventricular function has improved.  MRI L foot - 1. There is a  plantar soft tissue ulcer at the level of the fourth metatarsal head and metatarsophalangeal joint with sinus tract extending to the fourth metatarsal head and metatarsophalangeal joint. There is cortical destruction and marrow edema indicating acute osteomyelitis of the distal fourth metatarsal head and the 4th proximal phalanx. The metatarsal edema extends all the way proximally to the proximal metaphysis. There is also a moderate to large fourth metatarsophalangeal joint effusion consistent with septic arthritis. 2. Additional marrow edema and probable cortical erosion within the proximal fifth metatarsal also concerning for osteomyelitis. Tiny fifth metatarsophalangeal joint effusion. Mild marrow edema within the plantar aspect of the fifth metatarsal head may be secondary to degenerative changes, however it is difficult to exclude early minimal osteomyelitis. 3. Chronic nonunited fracture of the proximal second metatarsal.  ABI L Leg  - No evidence of hemodynamically significant lower extremity arterial occlusive disease at rest      Disposition Plan  :    Status is: Inpatient   DVT Prophylaxis  :    enoxaparin (LOVENOX) injection 40 mg Start: 05/13/22 2200    Lab Results  Component Value Date   PLT 186 05/18/2022    Diet :  Diet Order             Diet NPO time specified Except for: Sips with Meds  Diet effective now           Diet Carb Modified Fluid consistency: Thin; Room service appropriate? Yes  Diet effective now                    Inpatient Medications  Scheduled Meds:  amitriptyline  50 mg Oral QHS   aspirin EC  81 mg Oral Daily   atorvastatin  20 mg Oral q1800   calcium carbonate  500 mg of elemental calcium Oral Q breakfast   carvedilol  3.125 mg Oral BID WC  cyanocobalamin  1,000 mcg Oral Daily   enoxaparin (LOVENOX) injection  40 mg Subcutaneous Q24H   famotidine  20 mg Oral QHS   gabapentin  300 mg Oral TID   insulin aspart  0-20 Units Subcutaneous  TID WC   insulin aspart  0-5 Units Subcutaneous QHS   insulin aspart protamine- aspart  50 Units Subcutaneous BID WC   [START ON 05/19/2022] insulin glargine-yfgn  15 Units Subcutaneous Once   LORazepam  1 mg Oral QHS   pantoprazole  40 mg Oral BID AC   tamsulosin  0.4 mg Oral Daily   Continuous Infusions:  cefTRIAXone (ROCEPHIN)  IV 2 g (05/17/22 1411)   [START ON 05/19/2022] dextrose 5 % and 0.45 % NaCl with KCl 10 mEq/L     vancomycin 1,000 mg (05/18/22 0623)   PRN Meds:.acetaminophen **OR** acetaminophen, nitroGLYCERIN, ondansetron **OR** ondansetron (ZOFRAN) IV, oxycodone    Objective:   Vitals:   05/17/22 2018 05/18/22 0007 05/18/22 0458 05/18/22 0900  BP: 120/87 107/84 128/73 126/74  Pulse: 99 (!) 101 88 97  Resp: '18 17 16 15  '$ Temp: (!) 97.4 F (36.3 C) 97.8 F (36.6 C) 98.4 F (36.9 C) 98.7 F (37.1 C)  TempSrc: Oral Oral Oral Oral  SpO2: 100% 100% 99% 99%  Weight:      Height:        Wt Readings from Last 3 Encounters:  05/13/22 96.6 kg  02/11/21 102.2 kg  01/09/21 100.7 kg     Intake/Output Summary (Last 24 hours) at 05/18/2022 1019 Last data filed at 05/18/2022 0900 Gross per 24 hour  Intake 2017.9 ml  Output --  Net 2017.9 ml     Physical Exam  Awake Alert, No new F.N deficits, Normal affect [place].AT,PERRAL Supple Neck, No JVD,   Symmetrical Chest wall movement, Good air movement bilaterally, CTAB RRR,No Gallops, Rubs or new Murmurs,  +ve B.Sounds, Abd Soft, No tenderness,    Left foot under bandage.    RN pressure injury documentation: Pressure Injury 05/13/22 Foot Left;Posterior;Lower Unstageable - Full thickness tissue loss in which the base of the injury is covered by slough (yellow, tan, gray, green or brown) and/or eschar (tan, brown or black) in the wound bed. (Active)  05/13/22 1700  Location: Foot  Location Orientation: Left;Posterior;Lower  Staging: Unstageable - Full thickness tissue loss in which the base of the injury is covered  by slough (yellow, tan, gray, green or brown) and/or eschar (tan, brown or black) in the wound bed.  Wound Description (Comments):   Present on Admission: Yes  Dressing Type Gauze (Comment) 05/17/22 0555     Signature  Lala Lund M.D on 05/18/2022 at 10:19 AM   -  To page go to [email or web address]      Data Review:    CBC Recent Labs  Lab 05/13/22 1207 05/13/22 1425 05/14/22 0517 05/15/22 0630 05/16/22 0818 05/17/22 0033 05/18/22 0237  WBC 6.5   < > 5.0 5.6 5.5 6.7 7.5  HGB 14.3   < > 13.5 14.6 14.4 13.8 15.0  HCT 40.9   < > 38.3* 43.2 42.2 39.9 42.2  PLT 221   < > 191 200 177 187 186  MCV 82.6   < > 84.2 86.6 85.4 84.4 81.9  MCH 28.9   < > 29.7 29.3 29.1 29.2 29.1  MCHC 35.0   < > 35.2 33.8 34.1 34.6 35.5  RDW 13.7   < > 13.7 14.1 13.8 13.5 13.7  LYMPHSABS 0.9  --   --   --   --  1.6 1.6  MONOABS 0.4  --   --   --   --  0.6 0.7  EOSABS 0.1  --   --   --   --  0.1 0.1  BASOSABS 0.0  --   --   --   --  0.1 0.1   < > = values in this interval not displayed.    Electrolytes Recent Labs  Lab 05/13/22 1207 05/13/22 1415 05/13/22 1425 05/14/22 0517 05/14/22 0810 05/15/22 0630 05/16/22 0615 05/16/22 0818 05/17/22 0033 05/18/22 0237  NA 131*  --   --  139  --  140 139  --  137 137  K 4.6  --   --  3.6  --  3.1* 3.7  --  3.4* 3.3*  CL 94*  --   --  105  --  103 108  --  105 103  CO2 26  --   --  26  --  26 25  --  24 23  GLUCOSE 469*  --   --  133*  --  98 79  --  161* 81  BUN 13  --   --  10  --  7* <5*  --  7* 8  CREATININE 1.15  --  1.08 0.92  --  0.89 0.93  --  1.19 1.22  CALCIUM 9.0  --   --  8.7*  --  8.7* 9.2  --  8.9 9.1  AST 14*  --   --   --   --   --   --   --   --   --   ALT 16  --   --   --   --   --   --   --   --   --   ALKPHOS 179*  --   --   --   --   --   --   --   --   --   BILITOT 1.9*  --   --   --   --   --   --   --   --   --   ALBUMIN 4.2  --   --   --   --   --   --   --   --   --   MG  --   --   --  1.8  --  2.1 1.9  --  1.8 1.9  CRP   --   --   --   --   --   --   --  0.6  --   --   PROCALCITON  --   --   --   --   --   --   --  <0.10 <0.10 <0.10  LATICACIDVEN 1.5 2.4*  --   --  1.1  --   --   --   --   --   INR 1.1  --   --   --   --   --  1.2  --   --   --   HGBA1C  --   --  9.7*  --   --   --   --   --   --   --   BNP  --   --   --   --   --   --   --   --  20.8 11.6    Lab Results  Component Value Date  HGBA1C 9.7 (H) 05/13/2022    ID Labs Recent Labs  Lab 05/13/22 1207 05/13/22 1415 05/13/22 1425 05/14/22 0517 05/14/22 0810 05/15/22 0630 05/16/22 0615 05/16/22 0818 05/17/22 0033 05/18/22 0237  WBC 6.5  --    < > 5.0  --  5.6  --  5.5 6.7 7.5  PLT 221  --    < > 191  --  200  --  177 187 186  CRP  --   --   --   --   --   --   --  0.6  --   --   PROCALCITON  --   --   --   --   --   --   --  <0.10 <0.10 <0.10  LATICACIDVEN 1.5 2.4*  --   --  1.1  --   --   --   --   --   CREATININE 1.15  --    < > 0.92  --  0.89 0.93  --  1.19 1.22   < > = values in this interval not displayed.    Radiology Reports ECHOCARDIOGRAM COMPLETE  Result Date: 05/16/2022    ECHOCARDIOGRAM REPORT   Patient Name:   CLERENCE GUBSER Date of Exam: 05/16/2022 Medical Rec #:  [id number]     Height:       73.0 in Accession #:    [id number]    Weight:       213.0 lb Date of Birth:  08/26/1960    BSA:          2.210 m Patient Age:    17 years      BP:           119/85 mmHg Patient Gender: M             HR:           108 bpm. Exam Location:  Inpatient Procedure: 2D Echo, Cardiac Doppler, Color Doppler and Intracardiac            Opacification Agent Indications:    Congestive Heart Failure  History:        Patient has prior history of Echocardiogram examinations, most                 recent 01/08/2019. Cardiomyopathy, Previous Myocardial Infarction                 and CAD, Defibrillator, PAD; Risk Factors:Former Smoker,                 Hypertension and Diabetes. Chronic kidney disease.  Sonographer:    Darlina Sicilian RDCS Referring Phys:  Margaree Mackintosh [facility]  Sonographer Comments: Suboptimal apical window, no subcostal window and suboptimal parasternal window. IMPRESSIONS  1. Left ventricular ejection fraction, by estimation, is 50 to 55%. The left ventricle has low normal function. The left ventricle has no regional wall motion abnormalities. Left ventricular diastolic parameters are consistent with Grade I diastolic dysfunction (impaired relaxation).  2. Right ventricular systolic function is normal. The right ventricular size is normal. Tricuspid regurgitation signal is inadequate for assessing PA pressure.  3. The mitral valve is normal in structure. No evidence of mitral valve regurgitation.  4. The aortic valve is tricuspid. Aortic valve regurgitation is not visualized. No aortic stenosis is present.  5. Aortic dilatation noted. There is borderline dilatation of the aortic root, measuring 39 mm. Comparison(s): Prior images reviewed side by side. The left ventricular function has improved. FINDINGS  Left Ventricle: Left ventricular ejection fraction, by estimation, is 50 to 55%. The left ventricle has low normal function. The left ventricle has no regional wall motion abnormalities. Definity contrast agent was given IV to delineate the left ventricular endocardial borders. The left ventricular internal cavity size was normal in size. There is no left ventricular hypertrophy. Left ventricular diastolic parameters are consistent with Grade I diastolic dysfunction (impaired relaxation). Normal  left ventricular filling pressure. Right Ventricle: The right ventricular size is normal. No increase in right ventricular wall thickness. Right ventricular systolic function is normal. Tricuspid regurgitation signal is inadequate for assessing PA pressure. Left Atrium: Left atrial size was normal in size. Right Atrium: Right atrial size was normal in size. Pericardium: There is no evidence of pericardial effusion. Mitral Valve: The mitral valve is normal in  structure. No evidence of mitral valve regurgitation. Tricuspid Valve: The tricuspid valve is grossly normal. Tricuspid valve regurgitation is not demonstrated. Aortic Valve: The aortic valve is tricuspid. Aortic valve regurgitation is not visualized. No aortic stenosis is present. Pulmonic Valve: The pulmonic valve was not well visualized. Pulmonic valve regurgitation is not visualized. Aorta: Aortic dilatation noted. There is borderline dilatation of the aortic root, measuring 39 mm. Venous: The inferior vena cava was not well visualized. IAS/Shunts: The interatrial septum was not well visualized. Additional Comments: A is visualized in the right ventricle.  LEFT VENTRICLE PLAX 2D LVIDd:         4.60 cm     Diastology LVIDs:         2.70 cm     LV e' medial:    7.62 cm/s LV PW:         0.90 cm     LV E/e' medial:  11.4 LV IVS:        0.90 cm     LV e' lateral:   8.70 cm/s LVOT diam:     2.10 cm     LV E/e' lateral: 9.9 LV SV:         46 LV SV Index:   21 LVOT Area:     3.46 cm  LV Volumes (MOD) LV vol d, MOD A2C: 53.6 ml LV vol d, MOD A4C: 51.9 ml LV vol s, MOD A2C: 28.7 ml LV vol s, MOD A4C: 26.5 ml LV SV MOD A2C:     24.9 ml LV SV MOD A4C:     51.9 ml LV SV MOD BP:      24.2 ml RIGHT VENTRICLE RV S prime:     12.20 cm/s TAPSE (M-mode): 1.9 cm LEFT ATRIUM             Index LA diam:        3.30 cm 1.49 cm/m LA Vol (A2C):   24.2 ml 10.95 ml/m LA Vol (A4C):   32.2 ml 14.57 ml/m LA Biplane Vol: 29.7 ml 13.44 ml/m  AORTIC VALVE LVOT Vmax:   73.80 cm/s LVOT Vmean:  48.100 cm/s LVOT VTI:    0.133 m  AORTA Ao Root diam: 3.90 cm Ao Asc diam:  3.30 cm MITRAL VALVE MV Area (PHT): 3.37 cm     SHUNTS MV Decel Time: 225 msec     Systemic VTI:  0.13 m MV E velocity: 86.50 cm/s   Systemic Diam: 2.10 cm MV A velocity: 127.00 cm/s MV E/A ratio:  0.68 Mihai Croitoru MD Electronically signed by Sanda Klein MD Signature Date/Time: 05/16/2022/11:28:43 AM    Final

## 2022-05-18 NOTE — Progress Notes (Signed)
IS explained to patient. Patient able to preform IS without difficulty. PRN Oxy requested at 1149 PM for left foot pain, effective. Dressing changed per order.

## 2022-05-18 NOTE — Progress Notes (Signed)
Physical Therapy Treatment Patient Details Name: Mike Floyd MRN: [id number] DOB: [birth year] Today's Date: 05/18/2022   History of Present Illness Pt is a 61 y.o. male admitted 10/5 with L foot  fourth and fifth metatarsal osteomyelitis. He presented from his PCP office with worsening pain, swelling, and redness to his left foot. Admitted to [facility] 10/5 and transferred to [facility] 10/7. PMH:  poorly controlled type 2 diabetes, GERD, hypertension, dyslipidemia, CAD, and neuropathy.    PT Comments    Pt received in supine, agreeable to therapy session and with good participation and tolerance for gait and exercise instruction for LLE. Pt instructed on pain reduction technique in case of phantom pain, use of IS/frequency, LLE supine exercises for strengthening, safety with transfers using RW via PWB vs TDWB (need to confirm post-op if shoe needed and WB precs) and gradual progression of therapies within tolerance. Pt will need to review stair sequencing post-op prior to clearance for DC home and plan to bring HEP for pt to continue upon DC. Pt continues to benefit from PT services to progress toward functional mobility goals.    Recommendations for follow up therapy are one component of a multi-disciplinary discharge planning process, led by the attending physician.  Recommendations may be updated based on patient status, additional functional criteria and insurance authorization.  Follow Up Recommendations  No PT follow up     Assistance Recommended at Discharge PRN  Patient can return home with the following Assist for transportation;Help with stairs or ramp for entrance;Assistance with cooking/housework;A little help with walking and/or transfers   Equipment Recommendations  Rolling walker (2 wheels)    Recommendations for Other Services       Precautions / Restrictions Precautions Precautions: Fall Precaution Comments: HoH Restrictions Weight Bearing Restrictions: Yes LLE Weight Bearing:  Partial weight bearing Other Position/Activity Restrictions: "Partial weight training on the affected L. foot, please use a cane or a walker for now." - per Dr Candiss Norse 10/8     Mobility  Bed Mobility Overal bed mobility: Modified Independent             General bed mobility comments: to L EOB, HOB elevated    Transfers Overall transfer level: Needs assistance Equipment used: Rolling walker (2 wheels) Transfers: Sit to/from Stand Sit to Stand: Min guard           General transfer comment: min cues for WB precautions, min guard from EOB with pt using end of bed rail for support; Heel WB only with good compliance    Ambulation/Gait Ambulation/Gait assistance: Supervision Gait Distance (Feet): 20 Feet Assistive device: Rolling walker (2 wheels) Gait Pattern/deviations: Step-to pattern, Decreased stride length, Antalgic Gait velocity: decreased     General Gait Details: Pt WB through heel, but need to confirm post-op where WB restrictions will be (likely through heel but pt has chronic L heel ulcer as well). Distance limited due to fatigue after reciprocal transfers and RN bringing meds for him to take.   Stairs             Wheelchair Mobility    Modified Rankin (Stroke Patients Only)       Balance Overall balance assessment: Needs assistance Sitting-balance support: Feet supported, No upper extremity supported Sitting balance-Leahy Scale: Good     Standing balance support: During functional activity, Bilateral upper extremity supported Standing balance-Leahy Scale: Fair Standing balance comment: RW for amb due to WB precs/LLE pain  Cognition Arousal/Alertness: Awake/alert Behavior During Therapy: WFL for tasks assessed/performed Overall Cognitive Status: Within Functional Limits for tasks assessed                                 General Comments: HoH, pleasantly cooperative        Exercises Other  Exercises Other Exercises: supine LLE AROM: SLR, hip abduction, SAQ, ankle pumps x10 reps Other Exercises: STS x 5 reps from EOB<>RW reciprocal with TDWB to practice depending on what his precs will be post-op (not yet confirmed in [name] note, likely to be PWB?)    General Comments General comments (skin integrity, edema, etc.): VSS on RA per chart review, no acute s/sx distress      Pertinent Vitals/Pain Pain Assessment Pain Assessment: 0-10 Faces Pain Scale: Hurts even more Pain Location: L foot with mobility Pain Descriptors / Indicators: Discomfort Pain Intervention(s): Monitored during session, Repositioned, Other (comment) (pt instructed on phantom pain reduction techniques in anticipation of increased pain post-op)    Home Living                          Prior Function            PT Goals (current goals can now be found in the care plan section) Acute Rehab PT Goals Patient Stated Goal: to get better and go home so I can help my wife. PT Goal Formulation: With patient Time For Goal Achievement: 05/30/22 Progress towards PT goals: Progressing toward goals    Frequency    Min 3X/week      PT Plan Current plan remains appropriate    Co-evaluation              AM-PAC PT "6 Clicks" Mobility   Outcome Measure  Help needed turning from your back to your side while in a flat bed without using bedrails?: None Help needed moving from lying on your back to sitting on the side of a flat bed without using bedrails?: None Help needed moving to and from a bed to a chair (including a wheelchair)?: A Little Help needed standing up from a chair using your arms (e.g., wheelchair or bedside chair)?: A Little Help needed to walk in hospital room?: A Little Help needed climbing 3-5 steps with a railing? : A Little 6 Click Score: 20    End of Session Equipment Utilized During Treatment: Gait belt Activity Tolerance: Patient tolerated treatment well Patient  left: in chair;with call bell/phone within reach;Other (comment);with nursing/sitter in room (RN in room to give him pain meds) Nurse Communication: Mobility status PT Visit Diagnosis: Pain;Difficulty in walking, not elsewhere classified (R26.2) Pain - Right/Left: Left Pain - part of body: Ankle and joints of foot     Time: 1815-1840 PT Time Calculation (min) (ACUTE ONLY): 25 min  Charges:  $Gait Training: 8-22 mins $Therapeutic Exercise: 8-22 mins                     Windel Keziah P., PTA Acute Rehabilitation Services Secure Chat Preferred 9a-5:30pm Office: [place] 05/18/2022, 6:53 PM

## 2022-05-19 ENCOUNTER — Encounter (HOSPITAL_COMMUNITY): Payer: Self-pay | Admitting: Internal Medicine

## 2022-05-19 ENCOUNTER — Inpatient Hospital Stay (HOSPITAL_COMMUNITY): Payer: Medicare Other | Admitting: Anesthesiology

## 2022-05-19 ENCOUNTER — Encounter (HOSPITAL_COMMUNITY)
Admission: EM | Disposition: A | Discharge status: Home Health | Payer: Self-pay | Source: Home / Self Care | Attending: Internal Medicine

## 2022-05-19 DIAGNOSIS — I252 Old myocardial infarction: Secondary | ICD-10-CM

## 2022-05-19 DIAGNOSIS — M869 Osteomyelitis, unspecified: Secondary | ICD-10-CM

## 2022-05-19 DIAGNOSIS — I10 Essential (primary) hypertension: Secondary | ICD-10-CM

## 2022-05-19 DIAGNOSIS — E1169 Type 2 diabetes mellitus with other specified complication: Secondary | ICD-10-CM

## 2022-05-19 DIAGNOSIS — M86272 Subacute osteomyelitis, left ankle and foot: Secondary | ICD-10-CM

## 2022-05-19 DIAGNOSIS — Z87891 Personal history of nicotine dependence: Secondary | ICD-10-CM

## 2022-05-19 DIAGNOSIS — I2510 Atherosclerotic heart disease of native coronary artery without angina pectoris: Secondary | ICD-10-CM

## 2022-05-19 DIAGNOSIS — M86172 Other acute osteomyelitis, left ankle and foot: Secondary | ICD-10-CM | POA: Diagnosis not present

## 2022-05-19 HISTORY — PX: AMPUTATION: SHX166

## 2022-05-19 LAB — MAGNESIUM: Magnesium: 2 mg/dL (ref 1.7–2.4)

## 2022-05-19 LAB — BASIC METABOLIC PANEL
Anion gap: 8 (ref 5–15)
BUN: 9 mg/dL (ref 8–23)
CO2: 26 mmol/L (ref 22–32)
Calcium: 9.4 mg/dL (ref 8.9–10.3)
Chloride: 105 mmol/L (ref 98–111)
Creatinine, Ser: 1.21 mg/dL (ref 0.61–1.24)
GFR, Estimated: >60 mL/min (ref >60)
Glucose, Bld: 118 mg/dL — ABNORMAL HIGH (ref 70–99)
Potassium: 4.2 mmol/L (ref 3.5–5.1)
Sodium: 139 mmol/L (ref 135–145)

## 2022-05-19 LAB — CBC WITH DIFFERENTIAL/PLATELET
Abs Immature Granulocytes: 0.04 10*3/uL (ref 0.00–0.07)
Basophils Absolute: 0.1 10*3/uL (ref 0.0–0.1)
Basophils Relative: 1 %
Eosinophils Absolute: 0.2 10*3/uL (ref 0.0–0.5)
Eosinophils Relative: 3 %
HCT: 45.3 % (ref 39.0–52.0)
Hemoglobin: 15.3 g/dL (ref 13.0–17.0)
Immature Granulocytes: 1 %
Lymphocytes Relative: 17 %
Lymphs Abs: 1.2 10*3/uL (ref 0.7–4.0)
MCH: 28.7 pg (ref 26.0–34.0)
MCHC: 33.8 g/dL (ref 30.0–36.0)
MCV: 84.8 fL (ref 80.0–100.0)
Monocytes Absolute: 0.7 10*3/uL (ref 0.1–1.0)
Monocytes Relative: 10 %
Neutro Abs: 4.6 10*3/uL (ref 1.7–7.7)
Neutrophils Relative %: 68 %
Platelets: 178 10*3/uL (ref 150–400)
RBC: 5.34 MIL/uL (ref 4.22–5.81)
RDW: 14 % (ref 11.5–15.5)
WBC: 6.7 10*3/uL (ref 4.0–10.5)
nRBC: 0 % (ref 0.0–0.2)

## 2022-05-19 LAB — GLUCOSE, CAPILLARY
Glucose-Capillary: 113 mg/dL — ABNORMAL HIGH (ref 70–99)
Glucose-Capillary: 132 mg/dL — ABNORMAL HIGH (ref 70–99)
Glucose-Capillary: 131 mg/dL — ABNORMAL HIGH (ref 70–99)
Glucose-Capillary: 153 mg/dL — ABNORMAL HIGH (ref 70–99)
Glucose-Capillary: 110 mg/dL — ABNORMAL HIGH (ref 70–99)
Glucose-Capillary: 223 mg/dL — ABNORMAL HIGH (ref 70–99)
Glucose-Capillary: 308 mg/dL — ABNORMAL HIGH (ref 70–99)

## 2022-05-19 LAB — SURGICAL PCR SCREEN
MRSA, PCR: NEGATIVE
Staphylococcus aureus: NEGATIVE

## 2022-05-19 LAB — BRAIN NATRIURETIC PEPTIDE: B Natriuretic Peptide: 12.2 pg/mL (ref 0.0–100.0)

## 2022-05-19 SURGERY — AMPUTATION, FOOT, PARTIAL
Anesthesia: General | Site: Foot | Laterality: Left

## 2022-05-19 MED ORDER — INSULIN GLARGINE-YFGN 100 UNIT/ML ~~LOC~~ SOLN
10.0000 [IU] | Freq: Once | SUBCUTANEOUS | Status: DC
  Filled 2022-05-19: qty 0.1

## 2022-05-19 MED ORDER — SODIUM CHLORIDE 0.9 % IV SOLN: INTRAVENOUS | Status: DC

## 2022-05-19 MED ORDER — LABETALOL HCL 5 MG/ML IV SOLN: 10.0000 mg | INTRAVENOUS | Status: DC | PRN

## 2022-05-19 MED ORDER — JUVEN PO PACK
1.0000 | PACK | Freq: Two times a day (BID) | ORAL | Status: DC
  Administered 2022-05-19 – 2022-05-20 (×2): 1 via ORAL
  Filled 2022-05-19 (×3): qty 1

## 2022-05-19 MED ORDER — FENTANYL CITRATE (PF) 250 MCG/5ML IJ SOLN
INTRAMUSCULAR | Status: AC
  Filled 2022-05-19: qty 5

## 2022-05-19 MED ORDER — HYDROCODONE-ACETAMINOPHEN 5-325 MG PO TABS: 1.0000 | ORAL_TABLET | ORAL | Status: DC | PRN

## 2022-05-19 MED ORDER — MIDAZOLAM HCL 2 MG/2ML IJ SOLN: 1.0000 mg | Freq: Once | INTRAMUSCULAR | Status: AC

## 2022-05-19 MED ORDER — KCL IN DEXTROSE-NACL 10-5-0.45 MEQ/L-%-% IV SOLN
INTRAVENOUS | Status: AC
  Filled 2022-05-19: qty 1000

## 2022-05-19 MED ORDER — MAGNESIUM CITRATE PO SOLN: 1.0000 | Freq: Once | ORAL | Status: DC | PRN

## 2022-05-19 MED ORDER — CHLORHEXIDINE GLUCONATE 0.12 % MT SOLN
OROMUCOSAL | Status: AC
  Administered 2022-05-19: 15 mL via OROMUCOSAL
  Filled 2022-05-19: qty 15

## 2022-05-19 MED ORDER — ORAL CARE MOUTH RINSE: 15.0000 mL | Freq: Once | OROMUCOSAL | Status: AC

## 2022-05-19 MED ORDER — LIDOCAINE 2% (20 MG/ML) 5 ML SYRINGE
INTRAMUSCULAR | Status: AC
  Filled 2022-05-19: qty 5

## 2022-05-19 MED ORDER — LIDOCAINE 2% (20 MG/ML) 5 ML SYRINGE
INTRAMUSCULAR | Status: DC | PRN
  Administered 2022-05-19: 50 mg via INTRAVENOUS

## 2022-05-19 MED ORDER — ONDANSETRON HCL 4 MG/2ML IJ SOLN
INTRAMUSCULAR | Status: AC
  Filled 2022-05-19: qty 2

## 2022-05-19 MED ORDER — ONDANSETRON HCL 4 MG/2ML IJ SOLN
INTRAMUSCULAR | Status: DC | PRN
  Administered 2022-05-19: 4 mg via INTRAVENOUS

## 2022-05-19 MED ORDER — OXYCODONE HCL 5 MG PO TABS: 5.0000 mg | ORAL_TABLET | Freq: Once | ORAL | Status: DC | PRN

## 2022-05-19 MED ORDER — GUAIFENESIN-DM 100-10 MG/5ML PO SYRP: 15.0000 mL | ORAL_SOLUTION | ORAL | Status: DC | PRN

## 2022-05-19 MED ORDER — MAGNESIUM SULFATE 2 GM/50ML IV SOLN: 2.0000 g | Freq: Every day | INTRAVENOUS | Status: DC | PRN

## 2022-05-19 MED ORDER — FENTANYL CITRATE (PF) 100 MCG/2ML IJ SOLN: 25.0000 ug | INTRAMUSCULAR | Status: DC | PRN

## 2022-05-19 MED ORDER — LACTATED RINGERS IV SOLN: INTRAVENOUS | Status: DC

## 2022-05-19 MED ORDER — OXYCODONE HCL 5 MG/5ML PO SOLN: 5.0000 mg | Freq: Once | ORAL | Status: DC | PRN

## 2022-05-19 MED ORDER — ALUM & MAG HYDROXIDE-SIMETH 200-200-20 MG/5ML PO SUSP: 15.0000 mL | ORAL | Status: DC | PRN

## 2022-05-19 MED ORDER — PHENYLEPHRINE 80 MCG/ML (10ML) SYRINGE FOR IV PUSH (FOR BLOOD PRESSURE SUPPORT)
PREFILLED_SYRINGE | INTRAVENOUS | Status: DC | PRN
  Administered 2022-05-19: 80 ug via INTRAVENOUS
  Administered 2022-05-19: 160 ug via INTRAVENOUS

## 2022-05-19 MED ORDER — PANTOPRAZOLE SODIUM 40 MG PO TBEC: 40.0000 mg | DELAYED_RELEASE_TABLET | Freq: Every day | ORAL | Status: DC

## 2022-05-19 MED ORDER — PHENOL 1.4 % MT LIQD: 1.0000 | OROMUCOSAL | Status: DC | PRN

## 2022-05-19 MED ORDER — MORPHINE SULFATE (PF) 2 MG/ML IV SOLN
0.5000 mg | INTRAVENOUS | Status: DC | PRN
  Administered 2022-05-21: 1 mg via INTRAVENOUS
  Filled 2022-05-19: qty 1

## 2022-05-19 MED ORDER — HYDRALAZINE HCL 20 MG/ML IJ SOLN: 5.0000 mg | INTRAMUSCULAR | Status: DC | PRN

## 2022-05-19 MED ORDER — MIDAZOLAM HCL 2 MG/2ML IJ SOLN
INTRAMUSCULAR | Status: AC
  Administered 2022-05-19: 1 mg via INTRAVENOUS
  Filled 2022-05-19: qty 2

## 2022-05-19 MED ORDER — ONDANSETRON HCL 4 MG/2ML IJ SOLN: 4.0000 mg | Freq: Four times a day (QID) | INTRAMUSCULAR | Status: DC | PRN

## 2022-05-19 MED ORDER — INSULIN GLARGINE-YFGN 100 UNIT/ML ~~LOC~~ SOLN
8.0000 [IU] | Freq: Once | SUBCUTANEOUS | Status: AC
  Administered 2022-05-19: 8 [IU] via SUBCUTANEOUS
  Filled 2022-05-19: qty 0.08

## 2022-05-19 MED ORDER — POLYETHYLENE GLYCOL 3350 17 G PO PACK: 17.0000 g | PACK | Freq: Every day | ORAL | Status: DC | PRN

## 2022-05-19 MED ORDER — HYDROCODONE-ACETAMINOPHEN 7.5-325 MG PO TABS: 1.0000 | ORAL_TABLET | ORAL | Status: DC | PRN

## 2022-05-19 MED ORDER — CEFAZOLIN SODIUM-DEXTROSE 2-3 GM-%(50ML) IV SOLR
INTRAVENOUS | Status: DC | PRN
  Administered 2022-05-19: 2 g via INTRAVENOUS

## 2022-05-19 MED ORDER — CHLORHEXIDINE GLUCONATE 0.12 % MT SOLN
OROMUCOSAL | Status: AC
  Filled 2022-05-19: qty 15

## 2022-05-19 MED ORDER — PROPOFOL 10 MG/ML IV BOLUS
INTRAVENOUS | Status: DC | PRN
  Administered 2022-05-19: 170 mg via INTRAVENOUS

## 2022-05-19 MED ORDER — VITAMIN C 500 MG PO TABS
1000.0000 mg | ORAL_TABLET | Freq: Every day | ORAL | Status: DC
  Administered 2022-05-19 – 2022-05-21 (×3): 1000 mg via ORAL
  Filled 2022-05-19 (×3): qty 2

## 2022-05-19 MED ORDER — BISACODYL 5 MG PO TBEC: 5.0000 mg | DELAYED_RELEASE_TABLET | Freq: Every day | ORAL | Status: DC | PRN

## 2022-05-19 MED ORDER — FENTANYL CITRATE (PF) 100 MCG/2ML IJ SOLN
INTRAMUSCULAR | Status: AC
  Administered 2022-05-19: 50 ug via INTRAVENOUS
  Filled 2022-05-19: qty 2

## 2022-05-19 MED ORDER — FENTANYL CITRATE (PF) 100 MCG/2ML IJ SOLN: 50.0000 ug | Freq: Once | INTRAMUSCULAR | Status: AC

## 2022-05-19 MED ORDER — 0.9 % SODIUM CHLORIDE (POUR BTL) OPTIME
TOPICAL | Status: DC | PRN
  Administered 2022-05-19: 1000 mL

## 2022-05-19 MED ORDER — CEFAZOLIN SODIUM-DEXTROSE 2-4 GM/100ML-% IV SOLN
INTRAVENOUS | Status: AC
  Filled 2022-05-19: qty 100

## 2022-05-19 MED ORDER — DOCUSATE SODIUM 100 MG PO CAPS
100.0000 mg | ORAL_CAPSULE | Freq: Every day | ORAL | Status: DC
  Administered 2022-05-20: 100 mg via ORAL
  Filled 2022-05-19 (×2): qty 1

## 2022-05-19 MED ORDER — ZINC SULFATE 220 (50 ZN) MG PO CAPS
220.0000 mg | ORAL_CAPSULE | Freq: Every day | ORAL | Status: DC
  Administered 2022-05-19 – 2022-05-21 (×3): 220 mg via ORAL
  Filled 2022-05-19 (×3): qty 1

## 2022-05-19 MED ORDER — LIDOCAINE 2% (20 MG/ML) 5 ML SYRINGE
INTRAMUSCULAR | Status: AC
  Filled 2022-05-19: qty 10

## 2022-05-19 MED ORDER — PROPOFOL 10 MG/ML IV BOLUS
INTRAVENOUS | Status: AC
  Filled 2022-05-19: qty 20

## 2022-05-19 MED ORDER — POTASSIUM CHLORIDE CRYS ER 20 MEQ PO TBCR: 20.0000 meq | EXTENDED_RELEASE_TABLET | Freq: Every day | ORAL | Status: DC | PRN

## 2022-05-19 MED ORDER — METOPROLOL TARTRATE 5 MG/5ML IV SOLN: 2.0000 mg | INTRAVENOUS | Status: DC | PRN

## 2022-05-19 MED ORDER — ACETAMINOPHEN 325 MG PO TABS: 325.0000 mg | ORAL_TABLET | Freq: Four times a day (QID) | ORAL | Status: DC | PRN

## 2022-05-19 MED ORDER — CHLORHEXIDINE GLUCONATE 0.12 % MT SOLN: 15.0000 mL | Freq: Once | OROMUCOSAL | Status: AC

## 2022-05-19 SURGICAL SUPPLY — 36 items
BAG COUNTER SPONGE SURGICOUNT (BAG) ×1 IMPLANT
BENZOIN TINCTURE PRP APPL 2/3 (GAUZE/BANDAGES/DRESSINGS) ×1 IMPLANT
BLADE SAW SGTL HD 18.5X60.5X1. (BLADE) ×1 IMPLANT
BLADE SURG 21 STRL SS (BLADE) ×1 IMPLANT
BNDG COHESIVE 4X5 TAN STRL (GAUZE/BANDAGES/DRESSINGS) IMPLANT
BNDG COHESIVE 4X5 TAN STRL LF (GAUZE/BANDAGES/DRESSINGS) IMPLANT
BNDG GAUZE DERMACEA FLUFF 4 (GAUZE/BANDAGES/DRESSINGS) IMPLANT
COVER SURGICAL LIGHT HANDLE (MISCELLANEOUS) ×1 IMPLANT
DRAPE INCISE IOBAN 66X45 STRL (DRAPES) ×1 IMPLANT
DRAPE U-SHAPE 47X51 STRL (DRAPES) ×1 IMPLANT
DRESSING PEEL AND PLC PRVNA 13 (GAUZE/BANDAGES/DRESSINGS) IMPLANT
DRSG ADAPTIC 3X8 NADH LF (GAUZE/BANDAGES/DRESSINGS) IMPLANT
DRSG EMULSION OIL 3X3 NADH (GAUZE/BANDAGES/DRESSINGS) IMPLANT
DRSG PEEL AND PLACE PREVENA 13 (GAUZE/BANDAGES/DRESSINGS) ×1
DURAPREP 26ML APPLICATOR (WOUND CARE) ×1 IMPLANT
ELECT REM PT RETURN 9FT ADLT (ELECTROSURGICAL) ×1
ELECTRODE REM PT RTRN 9FT ADLT (ELECTROSURGICAL) ×1 IMPLANT
GAUZE PAD ABD 8X10 STRL (GAUZE/BANDAGES/DRESSINGS) IMPLANT
GAUZE SPONGE 4X4 12PLY STRL (GAUZE/BANDAGES/DRESSINGS) IMPLANT
GLOVE BIOGEL PI IND STRL 9 (GLOVE) ×1 IMPLANT
GLOVE SURG ORTHO 9.0 STRL STRW (GLOVE) ×1 IMPLANT
GOWN STRL REUS W/ TWL XL LVL3 (GOWN DISPOSABLE) ×3 IMPLANT
GOWN STRL REUS W/TWL XL LVL3 (GOWN DISPOSABLE) ×3
GRAFT SKIN WND MICRO 38 (Tissue) IMPLANT
KIT BASIN OR (CUSTOM PROCEDURE TRAY) ×1 IMPLANT
KIT TURNOVER KIT B (KITS) ×1 IMPLANT
NS IRRIG 1000ML POUR BTL (IV SOLUTION) ×1 IMPLANT
PACK ORTHO EXTREMITY (CUSTOM PROCEDURE TRAY) ×1 IMPLANT
PAD ARMBOARD 7.5X6 YLW CONV (MISCELLANEOUS) ×2 IMPLANT
SPONGE T-LAP 18X18 ~~LOC~~+RFID (SPONGE) IMPLANT
SUT ETHILON 2 0 PSLX (SUTURE) ×2 IMPLANT
TOWEL GREEN STERILE (TOWEL DISPOSABLE) ×1 IMPLANT
TOWEL GREEN STERILE FF (TOWEL DISPOSABLE) ×1 IMPLANT
TUBE CONNECTING 12X1/4 (SUCTIONS) ×1 IMPLANT
WATER STERILE IRR 1000ML POUR (IV SOLUTION) ×1 IMPLANT
YANKAUER SUCT BULB TIP NO VENT (SUCTIONS) ×1 IMPLANT

## 2022-05-19 NOTE — Progress Notes (Signed)
Patient being transported to same day surgery.

## 2022-05-19 NOTE — Progress Notes (Signed)
Mobility Specialist Progress Note:   05/19/22 1022  Mobility  Activity Off unit   Pt off unit at time of visit. Will f/u as able.   Mike Floyd Mobility Specialist-Acute Rehab Secure Chat only

## 2022-05-19 NOTE — Transfer of Care (Signed)
Immediate Anesthesia Transfer of Care Note  Patient: Mike Floyd  Procedure(s) Performed: TRANSMETATARSAL AMPUTATION LEFT FOOT (Left: Foot)  Patient Location: PACU  Anesthesia Type:General and Regional  Level of Consciousness: drowsy  Airway & Oxygen Therapy: Patient Spontanous Breathing and Patient connected to face mask oxygen  Post-op Assessment: Report given to RN and Post -op Vital signs reviewed and stable  Post vital signs: Reviewed and stable  Last Vitals:  Vitals Value Taken Time  BP 99/73 05/19/22 1223  Temp    Pulse 82 05/19/22 1225  Resp 14 05/19/22 1225  SpO2 97 % 05/19/22 1225  Vitals shown include unvalidated device data.  Last Pain:  Vitals:   05/19/22 1100  TempSrc:   PainSc: 0-No pain      Patients Stated Pain Goal: 2 (54/56/25 6389)  Complications: No notable events documented.

## 2022-05-19 NOTE — Progress Notes (Signed)
PROGRESS NOTE                                                                                                                                                                                                             Patient Demographics:    Mike Floyd, is a 61 y.o. male, DOB - 13-Mar-1961, VOZ:[id number]  Outpatient Primary MD for the patient is Redmond School, MD    LOS - 6  Admit date - 05/13/2022    Chief Complaint  Patient presents with   Wound Check       Brief Narrative (HPI from H&P)  - 61 y.o. male with medical history significant for poorly controlled type 2 diabetes, GERD, hypertension, dyslipidemia, CAD, and neuropathy who presented to his PCP office with worsening pain, swelling, and redness to his left foot.  He was redirected here for admission for IV antibiotics due to concern for underlying osteomyelitis.  He is indeed noted to have osteomyelitis to his fourth and fifth metatarsal on MRI.  He was placed on IV antibiotics and transferred from [facility] [address] for further care at [facility] under the Dr. Sharol Given.   Subjective:   Patient in bed, appears comfortable, denies any headache, no fever, no chest pain or pressure, no shortness of breath , no abdominal pain. No new focal weakness.   Assessment  & Plan :   Left foot acute osteomyelitis of the distal fourth metatarsal head and the 4th proximal phalanx in patient with diabetic foot ulcer -  Continue on IV vancomycin and Rocephin, follow blood cultures, discussed the case with Dr. Sharol Given who will see the patient likely operate on 05/19/2022.  ABI of the left foot stable.  He is agreeable for blood transfusion during the perioperative period.  CAD/dyslipidemia - Continue Coreg, aspirin and statin, chest pain-free with nonacute EKG, he says he had some heart issues in the past and required a defibrillator placement which has since been removed, cardiogram this  admission stable with improved EF of 55% and no wall motion abnormality.   Diabetic neuropathy -  Continue home medications of Elavil and gabapentin.  GERD - Continue PPI and Carafate   BPH - Continue tamsulosin   Hypertension - for now continue Coreg, Imdur and Lasix held as blood pressure borderline.  As needed hydralazine on board as well.  DM type II.  In poor control due to hyperglycemia as suggested by A1c, diabetic and insulin education, on long-acting insulin and sliding scale monitor and adjust.  He will be n.p.o. after midnight on 05/18/2022, insulin dosages adjusted and gentle D5 ordered for early morning of 05/19/2022.   Lab Results  Component Value Date   HGBA1C 9.7 (H) 05/13/2022   CBG (last 3)  Recent Labs    05/18/22 2043 05/19/22 0348 05/19/22 0746  GLUCAP 174* 113* 132*        Condition - Extremely Guarded  Family Communication  : Called daughter Lynelle Smoke [phone] on 05/16/2022 at 9:25 AM -phone switched off  Code Status :  Full  Consults  :  Dr Sharol Given  PUD Prophylaxis : PPI   Procedures  :      TTE - 1. Left ventricular ejection fraction, by estimation, is 50 to 55%. The left ventricle has low normal function. The left ventricle has no regional wall motion abnormalities. Left ventricular diastolic parameters are consistent with Grade I diastolic dysfunction (impaired relaxation).  2. Right ventricular systolic function is normal. The right ventricular size is normal. Tricuspid regurgitation signal is inadequate for assessing PA pressure.  3. The mitral valve is normal in structure. No evidence of mitral valve regurgitation.  4. The aortic valve is tricuspid. Aortic valve regurgitation is not visualized. No aortic stenosis is present.  5. Aortic dilatation noted. There is borderline dilatation of the aortic root, measuring 39 mm. Comparison(s): Prior images reviewed side by side. The left ventricular function has improved.  MRI L foot - 1. There is a  plantar soft tissue ulcer at the level of the fourth metatarsal head and metatarsophalangeal joint with sinus tract extending to the fourth metatarsal head and metatarsophalangeal joint. There is cortical destruction and marrow edema indicating acute osteomyelitis of the distal fourth metatarsal head and the 4th proximal phalanx. The metatarsal edema extends all the way proximally to the proximal metaphysis. There is also a moderate to large fourth metatarsophalangeal joint effusion consistent with septic arthritis. 2. Additional marrow edema and probable cortical erosion within the proximal fifth metatarsal also concerning for osteomyelitis. Tiny fifth metatarsophalangeal joint effusion. Mild marrow edema within the plantar aspect of the fifth metatarsal head may be secondary to degenerative changes, however it is difficult to exclude early minimal osteomyelitis. 3. Chronic nonunited fracture of the proximal second metatarsal.  ABI L Leg  - No evidence of hemodynamically significant lower extremity arterial occlusive disease at rest      Disposition Plan  :    Status is: Inpatient   DVT Prophylaxis  :    enoxaparin (LOVENOX) injection 40 mg Start: 05/13/22 2200    Lab Results  Component Value Date   PLT 178 05/19/2022    Diet :  Diet Order             Diet NPO time specified Except for: Sips with Meds  Diet effective now                    Inpatient Medications  Scheduled Meds:  amitriptyline  50 mg Oral QHS   aspirin EC  81 mg Oral Daily   atorvastatin  20 mg Oral q1800   calcium carbonate  500 mg of elemental calcium Oral Q breakfast   carvedilol  3.125 mg Oral BID WC   cyanocobalamin  1,000 mcg Oral Daily   enoxaparin (LOVENOX) injection  40 mg Subcutaneous Q24H   famotidine  20  mg Oral QHS   gabapentin  300 mg Oral TID   insulin aspart  0-20 Units Subcutaneous TID WC   insulin aspart  0-5 Units Subcutaneous QHS   insulin glargine-yfgn  8 Units Subcutaneous Once    LORazepam  1 mg Oral QHS   pantoprazole  40 mg Oral BID AC   tamsulosin  0.4 mg Oral Daily   Continuous Infusions:  cefTRIAXone (ROCEPHIN)  IV 200 mL/hr at 05/18/22 1600   dextrose 5 % and 0.45 % NaCl with KCl 10 mEq/L 50 mL/hr at 05/19/22 0749   vancomycin 200 mL/hr at 05/19/22 0653   PRN Meds:.acetaminophen **OR** acetaminophen, nitroGLYCERIN, ondansetron **OR** ondansetron (ZOFRAN) IV, oxycodone    Objective:   Vitals:   05/18/22 0900 05/18/22 2010 05/19/22 0353 05/19/22 0808  BP: 126/74 115/74 109/86 99/79  Pulse: 97 (!) 55 (!) 104 92  Resp: '15 17 18 18  '$ Temp: 98.7 F (37.1 C) 97.9 F (36.6 C) 97.8 F (36.6 C) 97.7 F (36.5 C)  TempSrc: Oral Oral  Oral  SpO2: 99% 96% 99% 99%  Weight:      Height:        Wt Readings from Last 3 Encounters:  05/13/22 96.6 kg  02/11/21 102.2 kg  01/09/21 100.7 kg     Intake/Output Summary (Last 24 hours) at 05/19/2022 0845 Last data filed at 05/19/2022 0653 Gross per 24 hour  Intake 2212.51 ml  Output --  Net 2212.51 ml     Physical Exam  Awake Alert, No new F.N deficits, Normal affect [place].AT,PERRAL Supple Neck, No JVD,   Symmetrical Chest wall movement, Good air movement bilaterally, CTAB RRR,No Gallops, Rubs or new Murmurs,  +ve B.Sounds, Abd Soft, No tenderness,    Left foot under bandage.    RN pressure injury documentation: Pressure Injury 05/13/22 Foot Left;Posterior;Lower Unstageable - Full thickness tissue loss in which the base of the injury is covered by slough (yellow, tan, gray, green or brown) and/or eschar (tan, brown or black) in the wound bed. (Active)  05/13/22 1700  Location: Foot  Location Orientation: Left;Posterior;Lower  Staging: Unstageable - Full thickness tissue loss in which the base of the injury is covered by slough (yellow, tan, gray, green or brown) and/or eschar (tan, brown or black) in the wound bed.  Wound Description (Comments):   Present on Admission: Yes  Dressing Type Gauze  (Comment) 05/18/22 1930     Signature  Lala Lund M.D on 05/19/2022 at 8:45 AM   -  To page go to [email or web address]      Data Review:    CBC Recent Labs  Lab 05/13/22 1207 05/13/22 1425 05/15/22 0630 05/16/22 0818 05/17/22 0033 05/18/22 0237 05/19/22 0511  WBC 6.5   < > 5.6 5.5 6.7 7.5 6.7  HGB 14.3   < > 14.6 14.4 13.8 15.0 15.3  HCT 40.9   < > 43.2 42.2 39.9 42.2 45.3  PLT 221   < > 200 177 187 186 178  MCV 82.6   < > 86.6 85.4 84.4 81.9 84.8  MCH 28.9   < > 29.3 29.1 29.2 29.1 28.7  MCHC 35.0   < > 33.8 34.1 34.6 35.5 33.8  RDW 13.7   < > 14.1 13.8 13.5 13.7 14.0  LYMPHSABS 0.9  --   --   --  1.6 1.6 1.2  MONOABS 0.4  --   --   --  0.6 0.7 0.7  EOSABS 0.1  --   --   --  0.1 0.1 0.2  BASOSABS 0.0  --   --   --  0.1 0.1 0.1   < > = values in this interval not displayed.    Electrolytes Recent Labs  Lab 05/13/22 1207 05/13/22 1415 05/13/22 1425 05/14/22 0517 05/14/22 0810 05/15/22 0630 05/16/22 0615 05/16/22 0818 05/17/22 0033 05/18/22 0237 05/19/22 0511  NA 131*  --   --    < >  --  140 139  --  137 137 139  K 4.6  --   --    < >  --  3.1* 3.7  --  3.4* 3.3* 4.2  CL 94*  --   --    < >  --  103 108  --  105 103 105  CO2 26  --   --    < >  --  26 25  --  '24 23 26  '$ GLUCOSE 469*  --   --    < >  --  98 79  --  161* 81 118*  BUN 13  --   --    < >  --  7* <5*  --  7* 8 9  CREATININE 1.15  --  1.08   < >  --  0.89 0.93  --  1.19 1.22 1.21  CALCIUM 9.0  --   --    < >  --  8.7* 9.2  --  8.9 9.1 9.4  AST 14*  --   --   --   --   --   --   --   --   --   --   ALT 16  --   --   --   --   --   --   --   --   --   --   ALKPHOS 179*  --   --   --   --   --   --   --   --   --   --   BILITOT 1.9*  --   --   --   --   --   --   --   --   --   --   ALBUMIN 4.2  --   --   --   --   --   --   --   --   --   --   MG  --   --   --    < >  --  2.1 1.9  --  1.8 1.9 2.0  CRP  --   --   --   --   --   --   --  0.6  --   --   --   PROCALCITON  --   --   --   --   --   --    --  <0.10 <0.10 <0.10  --   LATICACIDVEN 1.5 2.4*  --   --  1.1  --   --   --   --   --   --   INR 1.1  --   --   --   --   --  1.2  --   --   --   --   HGBA1C  --   --  9.7*  --   --   --   --   --   --   --   --   BNP  --   --   --   --   --   --   --   --  20.8 11.6 12.2   < > = values in this interval not displayed.    Lab Results  Component Value Date   HGBA1C 9.7 (H) 05/13/2022    ID Labs Recent Labs  Lab 05/13/22 1207 05/13/22 1415 05/13/22 1425 05/14/22 0810 05/15/22 0630 05/16/22 0615 05/16/22 0818 05/17/22 0033 05/18/22 0237 05/19/22 0511  WBC 6.5  --    < >  --  5.6  --  5.5 6.7 7.5 6.7  PLT 221  --    < >  --  200  --  177 187 186 178  CRP  --   --   --   --   --   --  0.6  --   --   --   PROCALCITON  --   --   --   --   --   --  <0.10 <0.10 <0.10  --   LATICACIDVEN 1.5 2.4*  --  1.1  --   --   --   --   --   --   CREATININE 1.15  --    < >  --  0.89 0.93  --  1.19 1.22 1.21   < > = values in this interval not displayed.    Radiology Reports ECHOCARDIOGRAM COMPLETE  Result Date: 05/16/2022    ECHOCARDIOGRAM REPORT   Patient Name:   TIPTON BALLOW Date of Exam: 05/16/2022 Medical Rec #:  [id number]     Height:       73.0 in Accession #:    [id number]    Weight:       213.0 lb Date of Birth:  04-Feb-1961    BSA:          2.210 m Patient Age:    63 years      BP:           119/85 mmHg Patient Gender: M             HR:           108 bpm. Exam Location:  Inpatient Procedure: 2D Echo, Cardiac Doppler, Color Doppler and Intracardiac            Opacification Agent Indications:    Congestive Heart Failure  History:        Patient has prior history of Echocardiogram examinations, most                 recent 01/08/2019. Cardiomyopathy, Previous Myocardial Infarction                 and CAD, Defibrillator, PAD; Risk Factors:Former Smoker,                 Hypertension and Diabetes. Chronic kidney disease.  Sonographer:    Darlina Sicilian RDCS Referring Phys: Margaree Mackintosh [facility]   Sonographer Comments: Suboptimal apical window, no subcostal window and suboptimal parasternal window. IMPRESSIONS  1. Left ventricular ejection fraction, by estimation, is 50 to 55%. The left ventricle has low normal function. The left ventricle has no regional wall motion abnormalities. Left ventricular diastolic parameters are consistent with Grade I diastolic dysfunction (impaired relaxation).  2. Right ventricular systolic function is normal. The right ventricular size is normal. Tricuspid regurgitation signal is inadequate for assessing PA pressure.  3. The mitral valve is normal in structure. No evidence of mitral valve regurgitation.  4. The aortic valve is tricuspid. Aortic valve regurgitation is not visualized. No aortic stenosis is present.  5. Aortic dilatation noted. There  is borderline dilatation of the aortic root, measuring 39 mm. Comparison(s): Prior images reviewed side by side. The left ventricular function has improved. FINDINGS  Left Ventricle: Left ventricular ejection fraction, by estimation, is 50 to 55%. The left ventricle has low normal function. The left ventricle has no regional wall motion abnormalities. Definity contrast agent was given IV to delineate the left ventricular endocardial borders. The left ventricular internal cavity size was normal in size. There is no left ventricular hypertrophy. Left ventricular diastolic parameters are consistent with Grade I diastolic dysfunction (impaired relaxation). Normal  left ventricular filling pressure. Right Ventricle: The right ventricular size is normal. No increase in right ventricular wall thickness. Right ventricular systolic function is normal. Tricuspid regurgitation signal is inadequate for assessing PA pressure. Left Atrium: Left atrial size was normal in size. Right Atrium: Right atrial size was normal in size. Pericardium: There is no evidence of pericardial effusion. Mitral Valve: The mitral valve is normal in structure. No  evidence of mitral valve regurgitation. Tricuspid Valve: The tricuspid valve is grossly normal. Tricuspid valve regurgitation is not demonstrated. Aortic Valve: The aortic valve is tricuspid. Aortic valve regurgitation is not visualized. No aortic stenosis is present. Pulmonic Valve: The pulmonic valve was not well visualized. Pulmonic valve regurgitation is not visualized. Aorta: Aortic dilatation noted. There is borderline dilatation of the aortic root, measuring 39 mm. Venous: The inferior vena cava was not well visualized. IAS/Shunts: The interatrial septum was not well visualized. Additional Comments: A is visualized in the right ventricle.  LEFT VENTRICLE PLAX 2D LVIDd:         4.60 cm     Diastology LVIDs:         2.70 cm     LV e' medial:    7.62 cm/s LV PW:         0.90 cm     LV E/e' medial:  11.4 LV IVS:        0.90 cm     LV e' lateral:   8.70 cm/s LVOT diam:     2.10 cm     LV E/e' lateral: 9.9 LV SV:         46 LV SV Index:   21 LVOT Area:     3.46 cm  LV Volumes (MOD) LV vol d, MOD A2C: 53.6 ml LV vol d, MOD A4C: 51.9 ml LV vol s, MOD A2C: 28.7 ml LV vol s, MOD A4C: 26.5 ml LV SV MOD A2C:     24.9 ml LV SV MOD A4C:     51.9 ml LV SV MOD BP:      24.2 ml RIGHT VENTRICLE RV S prime:     12.20 cm/s TAPSE (M-mode): 1.9 cm LEFT ATRIUM             Index LA diam:        3.30 cm 1.49 cm/m LA Vol (A2C):   24.2 ml 10.95 ml/m LA Vol (A4C):   32.2 ml 14.57 ml/m LA Biplane Vol: 29.7 ml 13.44 ml/m  AORTIC VALVE LVOT Vmax:   73.80 cm/s LVOT Vmean:  48.100 cm/s LVOT VTI:    0.133 m  AORTA Ao Root diam: 3.90 cm Ao Asc diam:  3.30 cm MITRAL VALVE MV Area (PHT): 3.37 cm     SHUNTS MV Decel Time: 225 msec     Systemic VTI:  0.13 m MV E velocity: 86.50 cm/s   Systemic Diam: 2.10 cm MV A velocity: 127.00 cm/s MV E/A ratio:  0.68 Sanda Klein MD Electronically signed by Sanda Klein MD Signature Date/Time: 05/16/2022/11:28:43 AM    Final

## 2022-05-19 NOTE — Progress Notes (Signed)
OT Cancellation Note  Patient Details Name: Mike Floyd MRN: [id number] DOB: 17-Feb-1961   Cancelled Treatment:    Reason Eval/Treat Not Completed: Patient at procedure or test/ unavailable. Will return as schedule allows.   Shanda Howells, OTR/L [facility] Acute Rehabilitation Office: [phone]   Lula Olszewski 05/19/2022, 12:59 PM

## 2022-05-19 NOTE — Progress Notes (Signed)
Patient arrieved from post op at approximately 1330. Wound vac in place, suction at 125 continuous. No drainage noted at this time. Patient denies pain or discomfort at this time. Agrees to get OOB at dinner.

## 2022-05-19 NOTE — Interval H&P Note (Signed)
History and Physical Interval Note:  05/19/2022 6:55 AM  Mike Floyd  has presented today for surgery, with the diagnosis of Left Foot Osteomyelitis.  The various methods of treatment have been discussed with the patient and family. After consideration of risks, benefits and other options for treatment, the patient has consented to  Procedure(s): TRANSMETATARSAL AMPUTATION LEFT FOOT (Left) as a surgical intervention.  The patient's history has been reviewed, patient examined, no change in status, stable for surgery.  I have reviewed the patient's chart and labs.  Questions were answered to the patient's satisfaction.     [name]

## 2022-05-19 NOTE — Anesthesia Preprocedure Evaluation (Signed)
Anesthesia Evaluation  Patient identified by MRN, date of birth, ID band Patient awake    Reviewed: Allergy & Precautions, H&P , NPO status , Patient's Chart, lab work & pertinent test results  Airway Mallampati: II   Neck ROM: full    Dental   Pulmonary sleep apnea , former smoker,    breath sounds clear to auscultation       Cardiovascular hypertension, + CAD and + Past MI  + Cardiac Defibrillator  Rhythm:regular Rate:Normal     Neuro/Psych PSYCHIATRIC DISORDERS Anxiety TIACVA    GI/Hepatic hiatal hernia, PUD, GERD  ,  Endo/Other  diabetes, Type 2  Renal/GU Renal InsufficiencyRenal disease     Musculoskeletal  (+) Arthritis ,   Abdominal   Peds  Hematology   Anesthesia Other Findings   Reproductive/Obstetrics                             Anesthesia Physical Anesthesia Plan  ASA: 3  Anesthesia Plan: General   Post-op Pain Management: Regional block*   Induction:   PONV Risk Score and Plan: 2 and Ondansetron, Midazolam, Treatment may vary due to age or medical condition and Dexamethasone  Airway Management Planned: LMA  Additional Equipment:   Intra-op Plan:   Post-operative Plan: Extubation in OR  Informed Consent: I have reviewed the patients History and Physical, chart, labs and discussed the procedure including the risks, benefits and alternatives for the proposed anesthesia with the patient or authorized representative who has indicated his/her understanding and acceptance.     Dental advisory given  Plan Discussed with: CRNA, Anesthesiologist and Surgeon  Anesthesia Plan Comments:         Anesthesia Quick Evaluation

## 2022-05-19 NOTE — Op Note (Signed)
05/19/2022  12:29 PM  PATIENT:  Mike Floyd    PRE-OPERATIVE DIAGNOSIS:  Left Foot Osteomyelitis  POST-OPERATIVE DIAGNOSIS:  Same  PROCEDURE:  TRANSMETATARSAL AMPUTATION LEFT FOOT Application Kerecis micro powder 38 cm. Application of 13 cm Prevena wound VAC.  SURGEON:  [name], MD  PHYSICIAN ASSISTANT:None ANESTHESIA:   General  PREOPERATIVE INDICATIONS:  Mike Floyd is a  61 y.o. male with a diagnosis of Left Foot Osteomyelitis who failed conservative measures and elected for surgical management.    The risks benefits and alternatives were discussed with the patient preoperatively including but not limited to the risks of infection, bleeding, nerve injury, cardiopulmonary complications, the need for revision surgery, among others, and the patient was willing to proceed.  OPERATIVE IMPLANTS: Kerecis micro powder 38 cm.  '@ENCIMAGES'$ @  OPERATIVE FINDINGS: Patient had minimal petechial bleeding.  No abscess no signs of infection at the wound margins.  OPERATIVE PROCEDURE: Patient was brought the operating room underwent general anesthetic.  After adequate levels anesthesia were obtained patient's left lower extremity was prepped using DuraPrep draped into a sterile field a timeout was called.  A fishmouth incision was made just proximal to the ulcerative tissue.  A oscillating saw was used to perform a transmetatarsal amputation.  Electrocautery was used for hemostasis.  The wound was irrigated with normal saline.  The wound bed was covered with 38 cm of Kerecis micro powder to cover a wound that was 10 x 5 cm.  The skin was closed using 2-0 nylon a Prevena wound VAC was applied this had a good suction fit.  There was hypertrophic callus on the heel and this was pared there is no signs of abscess or infection.  Patient was taken the PACU in stable condition.   DISCHARGE PLANNING:  Antibiotic duration: Continue antibiotics for 24 hours  Weightbearing: Touchdown  weightbearing on the left  Pain medication: Opioid pathway  Dressing care/ Wound VAC: Continue wound VAC for 1 week  Ambulatory devices: Walker or wheelchair  Discharge to: Anticipate discharge to home when safe with therapy.  Follow-up: In the office 1 week post operative.

## 2022-05-19 NOTE — Progress Notes (Signed)
Orthopedic Tech Progress Note Patient Details:  Mike Floyd June 05, 1961 [id number]  Ortho Devices Type of Ortho Device: Postop shoe/boot Ortho Device/Splint Location: LLE Ortho Device/Splint Interventions: Ordered, Application, Adjustment   Post Interventions Patient Tolerated: Well Instructions Provided: Care of device  Janit Pagan 05/19/2022, 6:52 PM

## 2022-05-19 NOTE — Anesthesia Procedure Notes (Signed)
Procedure Name: LMA Insertion Date/Time: 05/19/2022 11:51 AM  Performed by: Gwyndolyn Saxon, CRNAPre-anesthesia Checklist: Patient identified, Emergency Drugs available, Suction available and Patient being monitored Patient Re-evaluated:Patient Re-evaluated prior to induction Oxygen Delivery Method: Circle system utilized Preoxygenation: Pre-oxygenation with 100% oxygen Induction Type: IV induction Ventilation: Mask ventilation without difficulty LMA: LMA inserted LMA Size: 5.0 Number of attempts: 1 Placement Confirmation: positive ETCO2 and breath sounds checked- equal and bilateral Tube secured with: Tape Dental Injury: Teeth and Oropharynx as per pre-operative assessment

## 2022-05-20 ENCOUNTER — Encounter (HOSPITAL_COMMUNITY): Payer: Self-pay | Admitting: Orthopedic Surgery

## 2022-05-20 LAB — CBC WITH DIFFERENTIAL/PLATELET
Abs Immature Granulocytes: 0.02 10*3/uL (ref 0.00–0.07)
Basophils Absolute: 0.1 10*3/uL (ref 0.0–0.1)
Basophils Relative: 1 %
Eosinophils Absolute: 0.2 10*3/uL (ref 0.0–0.5)
Eosinophils Relative: 3 %
HCT: 38.7 % — ABNORMAL LOW (ref 39.0–52.0)
Hemoglobin: 13.6 g/dL (ref 13.0–17.0)
Immature Granulocytes: 0 %
Lymphocytes Relative: 16 %
Lymphs Abs: 1 10*3/uL (ref 0.7–4.0)
MCH: 29.4 pg (ref 26.0–34.0)
MCHC: 35.1 g/dL (ref 30.0–36.0)
MCV: 83.8 fL (ref 80.0–100.0)
Monocytes Absolute: 0.5 10*3/uL (ref 0.1–1.0)
Monocytes Relative: 9 %
Neutro Abs: 4.3 10*3/uL (ref 1.7–7.7)
Neutrophils Relative %: 71 %
Platelets: 155 10*3/uL (ref 150–400)
RBC: 4.62 MIL/uL (ref 4.22–5.81)
RDW: 13.9 % (ref 11.5–15.5)
WBC: 6.1 10*3/uL (ref 4.0–10.5)
nRBC: 0 % (ref 0.0–0.2)

## 2022-05-20 LAB — GLUCOSE, CAPILLARY
Glucose-Capillary: 180 mg/dL — ABNORMAL HIGH (ref 70–99)
Glucose-Capillary: 163 mg/dL — ABNORMAL HIGH (ref 70–99)
Glucose-Capillary: 139 mg/dL — ABNORMAL HIGH (ref 70–99)
Glucose-Capillary: 77 mg/dL (ref 70–99)

## 2022-05-20 LAB — BASIC METABOLIC PANEL
Anion gap: 12 (ref 5–15)
BUN: 10 mg/dL (ref 8–23)
CO2: 21 mmol/L — ABNORMAL LOW (ref 22–32)
Calcium: 9 mg/dL (ref 8.9–10.3)
Chloride: 103 mmol/L (ref 98–111)
Creatinine, Ser: 0.95 mg/dL (ref 0.61–1.24)
GFR, Estimated: >60 mL/min (ref >60)
Glucose, Bld: 183 mg/dL — ABNORMAL HIGH (ref 70–99)
Potassium: 3.8 mmol/L (ref 3.5–5.1)
Sodium: 136 mmol/L (ref 135–145)

## 2022-05-20 LAB — BRAIN NATRIURETIC PEPTIDE: B Natriuretic Peptide: 35.8 pg/mL (ref 0.0–100.0)

## 2022-05-20 LAB — MAGNESIUM: Magnesium: 1.7 mg/dL (ref 1.7–2.4)

## 2022-05-20 MED ORDER — INSULIN ASPART PROT & ASPART (70-30 MIX) 100 UNIT/ML ~~LOC~~ SUSP
50.0000 [IU] | Freq: Two times a day (BID) | SUBCUTANEOUS | Status: DC
  Administered 2022-05-20 (×2): 50 [IU] via SUBCUTANEOUS
  Filled 2022-05-20: qty 10

## 2022-05-20 MED ORDER — SODIUM CHLORIDE 0.9 % IV SOLN
2.0000 g | INTRAVENOUS | Status: AC
  Administered 2022-05-20: 2 g via INTRAVENOUS
  Filled 2022-05-20: qty 20

## 2022-05-20 NOTE — Progress Notes (Signed)
Physical Therapy Treatment Patient Details Name: Mike Floyd MRN: [id number] DOB: 06-08-1961 Today's Date: 05/20/2022   History of Present Illness Pt is a 61 y.o. male admitted 10/5 with L foot  fourth and fifth metatarsal osteomyelitis. s/p L TMA on 10/11. PMH:  poorly controlled type 2 diabetes, GERD, hypertension, dyslipidemia, CAD, and neuropathy.    PT Comments    Pt tolerating gait with RW well, although pt states he is more comfortable with crutches given prior experience with them, will attempt tomorrow prior to d/c. Pt struggles with TDWB, tends to put too much weight through LLE given neuropathy, pt more successful and comfortable with NWB at this time. PT to do stair training with pt tomorrow, progressing well and will be appropriate to d.c home.    Recommendations for follow up therapy are one component of a multi-disciplinary discharge planning process, led by the attending physician.  Recommendations may be updated based on patient status, additional functional criteria and insurance authorization.  Follow Up Recommendations  No PT follow up     Assistance Recommended at Discharge PRN  Patient can return home with the following Assist for transportation;Help with stairs or ramp for entrance;Assistance with cooking/housework;A little help with walking and/or transfers   Equipment Recommendations  Rolling walker (2 wheels)    Recommendations for Other Services       Precautions / Restrictions Precautions Precautions: Fall Required Braces or Orthoses: Other Brace Other Brace: post-op shoe Restrictions Weight Bearing Restrictions: Yes LLE Weight Bearing: Touchdown weight bearing LLE Partial Weight Bearing Percentage or Pounds: performs NWB better than TDWB secondary to neuropathy, tends to put too much weight on L foot when attempts TDWB     Mobility  Bed Mobility Overal bed mobility: Modified Independent                  Transfers Overall transfer  level: Needs assistance Equipment used: Rolling walker (2 wheels) Transfers: Sit to/from Stand Sit to Stand: Min guard           General transfer comment: close guard for safety, reinforcing TDWB LLE during rise and sit. STS x2, from EOB and chair in hallway    Ambulation/Gait Ambulation/Gait assistance: Supervision Gait Distance (Feet): 50 Feet (x2 - seated rest break) Assistive device: Rolling walker (2 wheels) Gait Pattern/deviations: Step-to pattern, Decreased stride length, Antalgic Gait velocity: decr     General Gait Details: cues for upright posture, TDWB LLE although difficult for pt to maintain, improved adherence to precautions with more conservative NWB   Stairs             Wheelchair Mobility    Modified Rankin (Stroke Patients Only)       Balance Overall balance assessment: Needs assistance Sitting-balance support: Feet supported, No upper extremity supported Sitting balance-Leahy Scale: Good     Standing balance support: During functional activity, Bilateral upper extremity supported Standing balance-Leahy Scale: Fair Standing balance comment: RW for amb due to WB precs/LLE pain                            Cognition Arousal/Alertness: Awake/alert Behavior During Therapy: WFL for tasks assessed/performed Overall Cognitive Status: Within Functional Limits for tasks assessed                                          Exercises  General Comments        Pertinent Vitals/Pain Pain Assessment Pain Assessment: Faces Faces Pain Scale: Hurts a little bit Pain Location: L foot with mobility Pain Descriptors / Indicators: Burning Pain Intervention(s): Monitored during session, Limited activity within patient's tolerance    Home Living                          Prior Function            PT Goals (current goals can now be found in the care plan section) Acute Rehab PT Goals Patient Stated Goal: to  get better and go home so I can help my wife. PT Goal Formulation: With patient Time For Goal Achievement: 05/30/22 Potential to Achieve Goals: Good Progress towards PT goals: Progressing toward goals    Frequency    Min 3X/week      PT Plan Current plan remains appropriate    Co-evaluation              AM-PAC PT "6 Clicks" Mobility   Outcome Measure  Help needed turning from your back to your side while in a flat bed without using bedrails?: None Help needed moving from lying on your back to sitting on the side of a flat bed without using bedrails?: None Help needed moving to and from a bed to a chair (including a wheelchair)?: None Help needed standing up from a chair using your arms (e.g., wheelchair or bedside chair)?: A Little Help needed to walk in hospital room?: A Little Help needed climbing 3-5 steps with a railing? : A Little 6 Click Score: 21    End of Session Equipment Utilized During Treatment: Other (comment) (postop shoe) Activity Tolerance: Patient tolerated treatment well Patient left: in bed;with bed alarm set;with call bell/phone within reach Nurse Communication: Mobility status PT Visit Diagnosis: Pain;Difficulty in walking, not elsewhere classified (R26.2) Pain - Right/Left: Left Pain - part of body: Ankle and joints of foot     Time: 3888-2800 PT Time Calculation (min) (ACUTE ONLY): 19 min  Charges:  $Gait Training: 8-22 mins                    Stacie Glaze, PT DPT Acute Rehabilitation Services Pager [phone]  Office [phone]    [place] 05/20/2022, 10:40 AM

## 2022-05-20 NOTE — Progress Notes (Signed)
Mobility Specialist Progress Note   05/20/22 1110  Mobility  Activity Ambulated with assistance to bathroom  Level of Assistance Standby assist, set-up cues, supervision of patient - no hands on  Assistive Device Front wheel walker  Distance Ambulated (ft) 10 ft  Range of Motion/Exercises Active;All extremities  LLE Weight Bearing NWB   Patient received dangling EOB requesting assistance to restroom. Ambulated supervision level with RW while NWB. Tolerated without complaint or incident. Was left with all needs met, call bell in reach.   [place], [place], [place]  MBEML:[phone] Office: [phone]

## 2022-05-20 NOTE — Progress Notes (Signed)
Occupational Therapy Treatment Patient Details Name: Mike Floyd MRN: [id number] DOB: Dec 19, 1960 Today's Date: 05/20/2022   History of present illness Pt is a 61 y.o. male admitted 10/5 with L foot  fourth and fifth metatarsal osteomyelitis. s/p L TMA on 10/11. PMH:  poorly controlled type 2 diabetes, GERD, hypertension, dyslipidemia, CAD, and neuropathy.   OT comments  Pt is s/p TMA on 10/11. Upon re-eval, pt reporting that his LLE is numb and that he has max difficulty discerning whether he is weight bearing through LLE, so maintaining NWB for safety throughout session. Pt performing sit<>stand, LB dressing, standing grooming, and shower transfer with min guard A. Pt performing all UB ADL with set up. Will continue to follow acutely to optimize safety and mobility.    Recommendations for follow up therapy are one component of a multi-disciplinary discharge planning process, led by the attending physician.  Recommendations may be updated based on patient status, additional functional criteria and insurance authorization.    Follow Up Recommendations  No OT follow up    Assistance Recommended at Discharge Set up Supervision/Assistance  Patient can return home with the following  A little help with walking and/or transfers;A little help with bathing/dressing/bathroom;Assistance with cooking/housework;Assist for transportation;Help with stairs or ramp for entrance   Equipment Recommendations  BSC/3in1    Recommendations for Other Services PT consult    Precautions / Restrictions Precautions Precautions: Fall Precaution Comments: HoH Required Braces or Orthoses: Other Brace Other Brace: post-op shoe Restrictions Weight Bearing Restrictions: Yes LLE Weight Bearing: Touchdown weight bearing LLE Partial Weight Bearing Percentage or Pounds: performs NWB better than TDWB secondary to neuropathy, tends to put too much weight on L foot when attempts TDWB       Mobility Bed  Mobility Overal bed mobility: Modified Independent                  Transfers Overall transfer level: Needs assistance Equipment used: Rolling walker (2 wheels) Transfers: Sit to/from Stand Sit to Stand: Min guard           General transfer comment: close guard for safety. No physical assist required.     Balance Overall balance assessment: Needs assistance Sitting-balance support: Feet supported, No upper extremity supported Sitting balance-Leahy Scale: Good Sitting balance - Comments: donning R sock sitting eob   Standing balance support: During functional activity, Bilateral upper extremity supported Standing balance-Leahy Scale: Fair Standing balance comment: RW for amb due to WB precs                           ADL either performed or assessed with clinical judgement   ADL Overall ADL's : Needs assistance/impaired Eating/Feeding: Modified independent   Grooming: Wash/dry face;Min guard;Minimal assistance;Standing Grooming Details (indicate cue type and reason): Initially required min A for balance during BUE task of washing face at sink. Pt educated regarding maintenance of balance and for option to Upper Body Bathing: Set up;Sitting   Lower Body Bathing: Min guard;Sit to/from stand   Upper Body Dressing : Set up;Sitting   Lower Body Dressing: Min guard;Sitting/lateral leans   Toilet Transfer: Min guard;Comfort height toilet;Rolling walker (2 wheels) Toilet Transfer Details (indicate cue type and reason): min guard A simulated in room Toileting- Clothing Manipulation and Hygiene: Supervision/safety;Sitting/lateral lean   Tub/ Shower Transfer: Walk-in shower;Min guard;Ambulation;Rolling walker (2 wheels) Tub/Shower Transfer Details (indicate cue type and reason): min guard A. Education provided on use of compensatpry techniques. Functional mobility  during ADLs: Min guard;Rolling walker (2 wheels) General ADL Comments: Pt requiring min guard A for  standing tasks after surgery. Reviewed how pt will carry wound vac with him at home. Pt with solidified plan to attach to pre-existing clothing articles.    Extremity/Trunk Assessment Upper Extremity Assessment Upper Extremity Assessment: Overall WFL for tasks assessed   Lower Extremity Assessment Lower Extremity Assessment: Defer to PT evaluation        Vision   Vision Assessment?: No apparent visual deficits   Perception     Praxis      Cognition Arousal/Alertness: Awake/alert Behavior During Therapy: WFL for tasks assessed/performed Overall Cognitive Status: Within Functional Limits for tasks assessed                                 General Comments: HOH but conversational and pleasant.        Exercises      Shoulder Instructions       General Comments VSS on RA    Pertinent Vitals/ Pain       Pain Assessment Pain Assessment: 0-10 Pain Score: 0-No pain Pain Location: Pt reporting no pain and that he cannot feel his LLE Pain Intervention(s): Monitored during session  Home Living                                          Prior Functioning/Environment              Frequency  Min 2X/week        Progress Toward Goals  OT Goals(current goals can now be found in the care plan section)  Progress towards OT goals: Progressing toward goals  Acute Rehab OT Goals Patient Stated Goal: go home OT Goal Formulation: With patient Time For Goal Achievement: 05/30/22 Potential to Achieve Goals: Good ADL Goals Pt Will Perform Upper Body Dressing: Independently;sitting Pt Will Perform Lower Body Dressing: Independently;sitting/lateral leans Pt Will Transfer to Toilet: Independently;ambulating;regular height toilet  Plan Discharge plan needs to be updated    Co-evaluation                 AM-PAC OT "6 Clicks" Daily Activity     Outcome Measure   Help from another person eating meals?: None Help from another person  taking care of personal grooming?: None Help from another person toileting, which includes using toliet, bedpan, or urinal?: None Help from another person bathing (including washing, rinsing, drying)?: A Little Help from another person to put on and taking off regular upper body clothing?: None Help from another person to put on and taking off regular lower body clothing?: A Little 6 Click Score: 22    End of Session Equipment Utilized During Treatment: Gait belt;Rolling walker (2 wheels)  OT Visit Diagnosis: Unsteadiness on feet (R26.81);Other abnormalities of gait and mobility (R26.89);Muscle weakness (generalized) (M62.81)   Activity Tolerance Patient tolerated treatment well   Patient Left in bed;with call bell/phone within reach;with bed alarm set   Nurse Communication Mobility status        Time: 7858-8502 OT Time Calculation (min): 22 min  Charges: OT General Charges $OT Visit: 1 Visit OT Evaluation $OT Re-eval: 1 Re-eval  Shanda Howells, OTR/L [facility] Acute Rehabilitation Office: [phone]   Lula Olszewski 05/20/2022, 3:47 PM

## 2022-05-20 NOTE — Progress Notes (Signed)
Patient ID: Mike Floyd, male   DOB: 09/12/1960, 61 y.o.   MRN: [id number] Postoperative day 1 left transmetatarsal amputation.  Wound VAC is functioning well.  Patient may be touchdown weightbearing on the left anticipate discharge when safe with therapy.  I will follow-up in the office in 1 week.

## 2022-05-20 NOTE — Anesthesia Postprocedure Evaluation (Signed)
Anesthesia Post Note  Patient: Mike Floyd  Procedure(s) Performed: TRANSMETATARSAL AMPUTATION LEFT FOOT (Left: Foot)     Patient location during evaluation: PACU Anesthesia Type: General and Regional Level of consciousness: awake and alert Pain management: pain level controlled Vital Signs Assessment: post-procedure vital signs reviewed and stable Respiratory status: spontaneous breathing, nonlabored ventilation, respiratory function stable and patient connected to nasal cannula oxygen Cardiovascular status: blood pressure returned to baseline and stable Postop Assessment: no apparent nausea or vomiting Anesthetic complications: no   No notable events documented.  Last Vitals:  Vitals:   05/20/22 0505 05/20/22 0729  BP: 109/71 107/78  Pulse: 98 (!) 106  Resp: 18 16  Temp: 36.6 C 36.5 C  SpO2: 97% 99%    Last Pain:  Vitals:   05/20/22 0729  TempSrc: Oral  PainSc:                  [place] S

## 2022-05-20 NOTE — TOC Initial Note (Signed)
Transition of Care [facility]) - Initial/Assessment Note    Patient Details  Name: Mike Floyd MRN: [id number] Date of Birth: [birth year]  [facility] CM/SW Contact:    Curlene Labrum, RN Phone Number: 05/20/2022, 4:09 PM  Clinical Narrative:                 CM met with the patient at the bedside to discuss transitions of care needs since the patient will be discharged home with family tomorrow.  The patient lives at home with his wife.  PT/OT was not recommending therapy post operatively at home - but the patient has a left foot Prevena wound vac at this time and will likely need wound care education and dressing changes post-removal of wound vac in a week after follow up at Dr. Jess Barters office.  I gave the patient preference regarding DME company and he states that [employer] was preferred as long as they are able to bill his Medicare - I called and they are unable to provide the 3:1.  I called [employer] and they will provide the RW and 3:1 today and will deliver to the patient's hospital room.  I called [place] and spoke with Lillia [place], [place] with [facility] and she will check regarding RN in [place] area.  [place] orders and DME orders placed to be co-signed by attending physician.  The patient's daughter will provide transportation to home tomorrow by car.  Expected Discharge Plan: [place] Barriers to Discharge: Continued Medical Work up   Patient Goals and CMS Choice Patient states their goals for this hospitalization and ongoing recovery are:: To return home. CMS Medicare.gov Compare Post Acute Care list provided to:: Patient Choice offered to / list presented to : Patient  Expected Discharge Plan and Services Expected Discharge Plan: [place]   Discharge Planning Services: CM Consult Post Acute Care Choice: Durable Medical Equipment, Home Health Living arrangements for the past 2 months: [place]                 DME  Arranged: 3-N-1, [place] Rile DME Agency: [employer] Date DME Agency Contacted: 05/20/22 Time DME Agency Contacted: [phone] Representative spoke with at DME Agency: Brenton Gr[place], [place] with [employer] HH Arranged: RN [place] Agency: [place] ([place]) Date [place]: 05/20/22 Time [place]: 1608 Representative spoke with at [place]: [place], CM  Prior Living Arrangements/Services Living arrangements for the past 2 months: [place] with:: Spouse Patient language and need for interpreter reviewed:: Yes Do you feel safe going back to the place where you live?: Yes      Need for Family Participation in Patient Care: Yes (Comment) Care giver support system in place?: Yes (comment) Current home services: DME ([employer] at the home) Criminal Activity/Legal Involvement Pertinent to Current Situation/Hospitalization: No - Comment as needed  Activities of Daily Living Home Assistive Devices/Equipment: CBG Meter, Cane (specify quad or straight), Dentures (specify type) ADL Screening (condition at time of admission) Patient's cognitive ability adequate to safely complete daily activities?: Yes Is the patient deaf or have difficulty hearing?: Yes Does the patient have difficulty seeing, even when wearing glasses/contacts?: No Does the patient have difficulty concentrating, remembering, or making decisions?: No Patient able to express need for assistance with ADLs?: Yes Does the patient have difficulty dressing or bathing?: No Independently performs ADLs?: Yes (appropriate for developmental age) Does the patient have difficulty walking or climbing  stairs?: No Weakness of Legs: None Weakness of Arms/Hands: None  Permission Sought/Granted Permission sought to share information with : Case Manager, Family Supports Permission granted to share information with : Yes, Verbal Permission Granted     Permission granted to share info w AGENCY: Home  health, DME company  Permission granted to share info w Relationship: family     Emotional Assessment Appearance:: Appears stated age Attitude/Demeanor/Rapport: Gracious Affect (typically observed): Accepting Orientation: : Oriented to Self, Oriented to Place, Oriented to  Time, Oriented to Situation Alcohol / Substance Use: Not Applicable Psych Involvement: No (comment)  Admission diagnosis:  Osteomyelitis ([place]) [M86.9] Osteomyelitis of left foot, unspecified type [facility]) [M86.9] Patient Active Problem List   Diagnosis Date Noted   Acute osteomyelitis of metatarsal bone of left foot (HCC)    Subacute osteomyelitis, left ankle and foot ([place]) 05/13/2022   Non-pressure chronic ulcer of other part of right foot limited to breakdown of skin ([place])    Contracture of right Achilles tendon    Mildly dilatd aortic root (HCC)    Contracture of left Achilles tendon    Metatarsal deformity, left    CAD (coronary artery disease)    Hallux hammertoe, left 12/11/2019   Diarrhea 06/27/2019   Diastasis recti 81/82/9937   Umbilical hernia without obstruction and without gangrene 08/22/2018   Vitamin D deficiency 06/23/2017   Essential hypertension, benign 06/15/2017   Mixed hyperlipidemia 06/15/2017   Class 1 obesity due to excess calories with serious comorbidity and body mass index (BMI) of 31.0 to 31.9 in adult 06/15/2017   Malfunction of implantable cardioverter-defibrillator (ICD) electrode 10/07/2016   Chronic kidney disease (CKD), stage III (moderate) ([place]) 04/04/2015   Bladder neck obstruction 03/21/2015   Difficult or painful urination 03/21/2015   Flank pain 03/21/2015   Delayed onset of urination 03/21/2015   Calculus of kidney 03/21/2015   Erosive esophagitis 12/27/2013   Hypotension due to drugs 16/96/7893   Chronic systolic heart failure ([place]) 04/24/2013   CAP (community acquired pneumonia) 03/16/2013   HTN (hypertension) 03/15/2013   Hydrocele 03/15/2013   Spermatocele  03/15/2013   DOE (dyspnea on exertion) 03/15/2013   OSA (obstructive sleep apnea) 01/09/2013   Chronic kidney disease, stage 3 ([place]) 01/03/2013   Chest pain 12/26/2012   History of diagnostic tests 09/06/2012   TIA (transient ischemic attack)    Cardiomyopathy, nonischemic ([place]) 02/24/2010   AICD (automatic cardioverter/defibrillator) 02/24/2010   DM type 2 causing vascular disease ([place]) 06/27/2009   Gout 06/27/2009   Tobacco abuse, in remission 06/27/2009   COLONIC POLYPS 10/31/2008   GERD (gastroesophageal reflux disease) 10/31/2008   PCP:  Redmond School, MD Pharmacy:   [place], [place] [address] [place] [address] Phone: [phone] Fax: [phone]  [facility] ([place], [place] [facility] [address] [place] [address] [place] [address] Phone: [phone] Fax: [phone]     Social Determinants of Health (SDOH) Interventions    Readmission Risk Interventions    05/20/2022    4:09 PM  Readmission Risk Prevention Plan  Post Dischage Appt Complete  Medication Screening Complete  Transportation Screening Complete

## 2022-05-20 NOTE — Progress Notes (Signed)
PROGRESS NOTE                                                                                                                                                                                                             Patient Demographics:    Mike Floyd, is a 61 y.o. male, DOB - 21-Mar-1961, NWG:[id number]  Outpatient Primary MD for the patient is Redmond School, MD    LOS - 7  Admit date - 05/13/2022    Chief Complaint  Patient presents with   Wound Check       Brief Narrative (HPI from H&P)  - 61 y.o. male with medical history significant for poorly controlled type 2 diabetes, GERD, hypertension, dyslipidemia, CAD, and neuropathy who presented to his PCP office with worsening pain, swelling, and redness to his left foot.  He was redirected here for admission for IV antibiotics due to concern for underlying osteomyelitis.  He is indeed noted to have osteomyelitis to his fourth and fifth metatarsal on MRI.  He was placed on IV antibiotics and transferred from [facility] for further care at [facility] under the Dr. Sharol Given.   Subjective:   Patient in bed, appears comfortable, denies any headache, no fever, no chest pain or pressure, no shortness of breath , no abdominal pain. No focal weakness.   Assessment  & Plan :   Left foot acute osteomyelitis of the distal fourth metatarsal head and the 4th proximal phalanx in patient with diabetic foot ulcer -  Continue on IV vancomycin and Rocephin, follow blood cultures, was seen by orthopedics, underwent left foot transmetatarsal amputation by Dr. Sharol Given on 05/19/2022, Case discussed with Dr. Sharol Given margins clean, stop antibiotics.  He has a wound VAC in place which will be continued upon discharge, initiate PT OT if stable likely discharge on 05/21/2022.  CAD/dyslipidemia - Continue Coreg, aspirin and statin, chest pain-free with nonacute EKG, he says he had some heart issues in  the past and required a defibrillator placement which has since been removed, cardiogram this admission stable with improved EF of 55% and no wall motion abnormality.   Diabetic neuropathy -  Continue home medications of Elavil and gabapentin.  GERD - Continue PPI and Carafate   BPH - Continue tamsulosin   Hypertension - for now continue Coreg, Imdur and Lasix held as blood  pressure borderline.  As needed hydralazine on board as well.   DM type II.  In poor control due to hyperglycemia as suggested by A1c, diabetic and insulin education, insulin regimen adjusted further on 05/20/2022 will monitor.   Lab Results  Component Value Date   HGBA1C 9.7 (H) 05/13/2022   CBG (last 3)  Recent Labs    05/19/22 1643 05/19/22 2105 05/20/22 0728  GLUCAP 223* 308* 180*        Condition - Extremely Guarded  Family Communication  : Called daughter Lynelle Smoke [phone] on 05/16/2022 at 9:25 AM -phone switched off  Code Status :  Full  Consults  :  Dr Sharol Given  PUD Prophylaxis : PPI   Procedures  :      On 05/19/2022 by Dr. Sharol Given.   TRANSMETATARSAL AMPUTATION LEFT FOOT, Application Kerecis micro powder 38 cm. Application of 13 cm Prevena wound VAC.   TTE - 1. Left ventricular ejection fraction, by estimation, is 50 to 55%. The left ventricle has low normal function. The left ventricle has no regional wall motion abnormalities. Left ventricular diastolic parameters are consistent with Grade I diastolic dysfunction (impaired relaxation).  2. Right ventricular systolic function is normal. The right ventricular size is normal. Tricuspid regurgitation signal is inadequate for assessing PA pressure.  3. The mitral valve is normal in structure. No evidence of mitral valve regurgitation.  4. The aortic valve is tricuspid. Aortic valve regurgitation is not visualized. No aortic stenosis is present.  5. Aortic dilatation noted. There is borderline dilatation of the aortic root, measuring 39 mm.  Comparison(s): Prior images reviewed side by side. The left ventricular function has improved.  MRI L foot - 1. There is a plantar soft tissue ulcer at the level of the fourth metatarsal head and metatarsophalangeal joint with sinus tract extending to the fourth metatarsal head and metatarsophalangeal joint. There is cortical destruction and marrow edema indicating acute osteomyelitis of the distal fourth metatarsal head and the 4th proximal phalanx. The metatarsal edema extends all the way proximally to the proximal metaphysis. There is also a moderate to large fourth metatarsophalangeal joint effusion consistent with septic arthritis. 2. Additional marrow edema and probable cortical erosion within the proximal fifth metatarsal also concerning for osteomyelitis. Tiny fifth metatarsophalangeal joint effusion. Mild marrow edema within the plantar aspect of the fifth metatarsal head may be secondary to degenerative changes, however it is difficult to exclude early minimal osteomyelitis. 3. Chronic nonunited fracture of the proximal second metatarsal.  ABI L Leg  - No evidence of hemodynamically significant lower extremity arterial occlusive disease at rest      Disposition Plan  :    Status is: Inpatient   DVT Prophylaxis  :    SCD's Start: 05/19/22 1323 enoxaparin (LOVENOX) injection 40 mg Start: 05/13/22 2200    Lab Results  Component Value Date   PLT 155 05/20/2022    Diet :  Diet Order             Diet Carb Modified Fluid consistency: Thin; Room service appropriate? Yes  Diet effective now                    Inpatient Medications  Scheduled Meds:  amitriptyline  50 mg Oral QHS   vitamin C  1,000 mg Oral Daily   aspirin EC  81 mg Oral Daily   atorvastatin  20 mg Oral q1800   calcium carbonate  500 mg of elemental calcium Oral Q breakfast  carvedilol  3.125 mg Oral BID WC   cyanocobalamin  1,000 mcg Oral Daily   docusate sodium  100 mg Oral Daily   enoxaparin  (LOVENOX) injection  40 mg Subcutaneous Q24H   famotidine  20 mg Oral QHS   gabapentin  300 mg Oral TID   insulin aspart  0-20 Units Subcutaneous TID WC   insulin aspart  0-5 Units Subcutaneous QHS   insulin aspart protamine- aspart  50 Units Subcutaneous BID WC   LORazepam  1 mg Oral QHS   nutrition supplement (JUVEN)  1 packet Oral BID BM   pantoprazole  40 mg Oral BID AC   tamsulosin  0.4 mg Oral Daily   zinc sulfate  220 mg Oral Daily   Continuous Infusions:  cefTRIAXone (ROCEPHIN)  IV     magnesium sulfate bolus IVPB     PRN Meds:.[DISCONTINUED] acetaminophen **OR** acetaminophen, acetaminophen, alum & mag hydroxide-simeth, bisacodyl, guaiFENesin-dextromethorphan, hydrALAZINE, labetalol, magnesium citrate, magnesium sulfate bolus IVPB, metoprolol tartrate, morphine injection, nitroGLYCERIN, ondansetron, ondansetron **OR** [DISCONTINUED] ondansetron (ZOFRAN) IV, oxycodone, phenol, polyethylene glycol, potassium chloride    Objective:   Vitals:   05/20/22 0016 05/20/22 0418 05/20/22 0505 05/20/22 0729  BP: 119/77 98/75 109/71 107/78  Pulse: 89 (!) 102 98 (!) 106  Resp: '18 18 18 16  '$ Temp: 98.5 F (36.9 C) 97.9 F (36.6 C) 97.9 F (36.6 C) 97.7 F (36.5 C)  TempSrc: Oral Oral  Oral  SpO2: 94% 97% 97% 99%  Weight:      Height:        Wt Readings from Last 3 Encounters:  05/13/22 96.6 kg  02/11/21 102.2 kg  01/09/21 100.7 kg     Intake/Output Summary (Last 24 hours) at 05/20/2022 0835 Last data filed at 05/20/2022 0729 Gross per 24 hour  Intake 1491.65 ml  Output 2825 ml  Net -1333.35 ml     Physical Exam  Awake Alert, No new F.N deficits, Normal affect [place].AT,PERRAL Supple Neck, No JVD,   Symmetrical Chest wall movement, Good air movement bilaterally, CTAB RRR,No Gallops, Rubs or new Murmurs,  +ve B.Sounds, Abd Soft, No tenderness,      Left foot transmetatarsal amputation site stable with wound VAC & under bandage  Signature  Lala Lund M.D on  05/20/2022 at 8:35 AM   -  To page go to [email or web address]      Data Review:    CBC Recent Labs  Lab 05/13/22 1207 05/13/22 1425 05/16/22 0818 05/17/22 0033 05/18/22 0237 05/19/22 0511 05/20/22 0545  WBC 6.5   < > 5.5 6.7 7.5 6.7 6.1  HGB 14.3   < > 14.4 13.8 15.0 15.3 13.6  HCT 40.9   < > 42.2 39.9 42.2 45.3 38.7*  PLT 221   < > 177 187 186 178 155  MCV 82.6   < > 85.4 84.4 81.9 84.8 83.8  MCH 28.9   < > 29.1 29.2 29.1 28.7 29.4  MCHC 35.0   < > 34.1 34.6 35.5 33.8 35.1  RDW 13.7   < > 13.8 13.5 13.7 14.0 13.9  LYMPHSABS 0.9  --   --  1.6 1.6 1.2 1.0  MONOABS 0.4  --   --  0.6 0.7 0.7 0.5  EOSABS 0.1  --   --  0.1 0.1 0.2 0.2  BASOSABS 0.0  --   --  0.1 0.1 0.1 0.1   < > = values in this interval not displayed.    Electrolytes Recent Labs  Lab 05/13/22 1207  05/13/22 1415 05/13/22 1425 05/14/22 0517 05/14/22 0810 05/15/22 0630 05/16/22 0615 05/16/22 0818 05/17/22 0033 05/18/22 0237 05/19/22 0511 05/20/22 0545  NA 131*  --   --    < >  --    < > 139  --  137 137 139 136  K 4.6  --   --    < >  --    < > 3.7  --  3.4* 3.3* 4.2 3.8  CL 94*  --   --    < >  --    < > 108  --  105 103 105 103  CO2 26  --   --    < >  --    < > 25  --  '24 23 26 '$ 21*  GLUCOSE 469*  --   --    < >  --    < > 79  --  161* 81 118* 183*  BUN 13  --   --    < >  --    < > <5*  --  7* '8 9 10  '$ CREATININE 1.15  --  1.08   < >  --    < > 0.93  --  1.19 1.22 1.21 0.95  CALCIUM 9.0  --   --    < >  --    < > 9.2  --  8.9 9.1 9.4 9.0  AST 14*  --   --   --   --   --   --   --   --   --   --   --   ALT 16  --   --   --   --   --   --   --   --   --   --   --   ALKPHOS 179*  --   --   --   --   --   --   --   --   --   --   --   BILITOT 1.9*  --   --   --   --   --   --   --   --   --   --   --   ALBUMIN 4.2  --   --   --   --   --   --   --   --   --   --   --   MG  --   --   --    < >  --    < > 1.9  --  1.8 1.9 2.0 1.7  CRP  --   --   --   --   --   --   --  0.6  --   --   --   --   PROCALCITON   --   --   --   --   --   --   --  <0.10 <0.10 <0.10  --   --   LATICACIDVEN 1.5 2.4*  --   --  1.1  --   --   --   --   --   --   --   INR 1.1  --   --   --   --   --  1.2  --   --   --   --   --   HGBA1C  --   --  9.7*  --   --   --   --   --   --   --   --   --  BNP  --   --   --   --   --   --   --   --  20.8 11.6 12.2 35.8   < > = values in this interval not displayed.    Lab Results  Component Value Date   HGBA1C 9.7 (H) 05/13/2022    ID Labs Recent Labs  Lab 05/13/22 1207 05/13/22 1415 05/13/22 1425 05/14/22 0810 05/15/22 0630 05/16/22 0615 05/16/22 0818 05/17/22 0033 05/18/22 0237 05/19/22 0511 05/20/22 0545  WBC 6.5  --    < >  --    < >  --  5.5 6.7 7.5 6.7 6.1  PLT 221  --    < >  --    < >  --  177 187 186 178 155  CRP  --   --   --   --   --   --  0.6  --   --   --   --   PROCALCITON  --   --   --   --   --   --  <0.10 <0.10 <0.10  --   --   LATICACIDVEN 1.5 2.4*  --  1.1  --   --   --   --   --   --   --   CREATININE 1.15  --    < >  --    < > 0.93  --  1.19 1.22 1.21 0.95   < > = values in this interval not displayed.    Radiology Reports ECHOCARDIOGRAM COMPLETE  Result Date: 05/16/2022    ECHOCARDIOGRAM REPORT   Patient Name:   WESTLEY BLASS Date of Exam: 05/16/2022 Medical Rec #:  [id number]     Height:       73.0 in Accession #:    [id number]    Weight:       213.0 lb Date of Birth:  September 02, 1960    BSA:          2.210 m Patient Age:    28 years      BP:           119/85 mmHg Patient Gender: M             HR:           108 bpm. Exam Location:  Inpatient Procedure: 2D Echo, Cardiac Doppler, Color Doppler and Intracardiac            Opacification Agent Indications:    Congestive Heart Failure  History:        Patient has prior history of Echocardiogram examinations, most                 recent 01/08/2019. Cardiomyopathy, Previous Myocardial Infarction                 and CAD, Defibrillator, PAD; Risk Factors:Former Smoker,                 Hypertension and Diabetes.  Chronic kidney disease.  Sonographer:    Darlina Sicilian RDCS Referring Phys: [facility]  Sonographer Comments: Suboptimal apical window, no subcostal window and suboptimal parasternal window. IMPRESSIONS  1. Left ventricular ejection fraction, by estimation, is 50 to 55%. The left ventricle has low normal function. The left ventricle has no regional wall motion abnormalities. Left ventricular diastolic parameters are consistent with Grade I diastolic dysfunction (impaired relaxation).  2. Right ventricular systolic function is normal. The right ventricular size is  normal. Tricuspid regurgitation signal is inadequate for assessing PA pressure.  3. The mitral valve is normal in structure. No evidence of mitral valve regurgitation.  4. The aortic valve is tricuspid. Aortic valve regurgitation is not visualized. No aortic stenosis is present.  5. Aortic dilatation noted. There is borderline dilatation of the aortic root, measuring 39 mm. Comparison(s): Prior images reviewed side by side. The left ventricular function has improved. FINDINGS  Left Ventricle: Left ventricular ejection fraction, by estimation, is 50 to 55%. The left ventricle has low normal function. The left ventricle has no regional wall motion abnormalities. Definity contrast agent was given IV to delineate the left ventricular endocardial borders. The left ventricular internal cavity size was normal in size. There is no left ventricular hypertrophy. Left ventricular diastolic parameters are consistent with Grade I diastolic dysfunction (impaired relaxation). Normal  left ventricular filling pressure. Right Ventricle: The right ventricular size is normal. No increase in right ventricular wall thickness. Right ventricular systolic function is normal. Tricuspid regurgitation signal is inadequate for assessing PA pressure. Left Atrium: Left atrial size was normal in size. Right Atrium: Right atrial size was normal in size. Pericardium: There is no  evidence of pericardial effusion. Mitral Valve: The mitral valve is normal in structure. No evidence of mitral valve regurgitation. Tricuspid Valve: The tricuspid valve is grossly normal. Tricuspid valve regurgitation is not demonstrated. Aortic Valve: The aortic valve is tricuspid. Aortic valve regurgitation is not visualized. No aortic stenosis is present. Pulmonic Valve: The pulmonic valve was not well visualized. Pulmonic valve regurgitation is not visualized. Aorta: Aortic dilatation noted. There is borderline dilatation of the aortic root, measuring 39 mm. Venous: The inferior vena cava was not well visualized. IAS/Shunts: The interatrial septum was not well visualized. Additional Comments: A is visualized in the right ventricle.  LEFT VENTRICLE PLAX 2D LVIDd:         4.60 cm     Diastology LVIDs:         2.70 cm     LV e' medial:    7.62 cm/s LV PW:         0.90 cm     LV E/e' medial:  11.4 LV IVS:        0.90 cm     LV e' lateral:   8.70 cm/s LVOT diam:     2.10 cm     LV E/e' lateral: 9.9 LV SV:         46 LV SV Index:   21 LVOT Area:     3.46 cm  LV Volumes (MOD) LV vol d, MOD A2C: 53.6 ml LV vol d, MOD A4C: 51.9 ml LV vol s, MOD A2C: 28.7 ml LV vol s, MOD A4C: 26.5 ml LV SV MOD A2C:     24.9 ml LV SV MOD A4C:     51.9 ml LV SV MOD BP:      24.2 ml RIGHT VENTRICLE RV S prime:     12.20 cm/s TAPSE (M-mode): 1.9 cm LEFT ATRIUM             Index LA diam:        3.30 cm 1.49 cm/m LA Vol (A2C):   24.2 ml 10.95 ml/m LA Vol (A4C):   32.2 ml 14.57 ml/m LA Biplane Vol: 29.7 ml 13.44 ml/m  AORTIC VALVE LVOT Vmax:   73.80 cm/s LVOT Vmean:  48.100 cm/s LVOT VTI:    0.133 m  AORTA Ao Root diam: 3.90 cm Ao Asc diam:  3.30 cm MITRAL VALVE MV Area (PHT): 3.37 cm     SHUNTS MV Decel Time: 225 msec     Systemic VTI:  0.13 m MV E velocity: 86.50 cm/s   Systemic Diam: 2.10 cm MV A velocity: 127.00 cm/s MV E/A ratio:  0.68 Mihai Croitoru MD Electronically signed by Sanda Klein MD Signature Date/Time:  05/16/2022/11:28:43 AM    Final

## 2022-05-21 LAB — GLUCOSE, CAPILLARY
Glucose-Capillary: 165 mg/dL — ABNORMAL HIGH (ref 70–99)
Glucose-Capillary: 205 mg/dL — ABNORMAL HIGH (ref 70–99)

## 2022-05-21 MED ORDER — METOPROLOL TARTRATE 25 MG PO TABS
25.0000 mg | ORAL_TABLET | Freq: Once | ORAL | Status: AC
  Administered 2022-05-21: 25 mg via ORAL
  Filled 2022-05-21: qty 1

## 2022-05-21 NOTE — Care Management Important Message (Signed)
Important Message  Patient Details  Name: Mike Floyd MRN: [id number] Date of Birth: [birth year]   Medicare Important Message Given:  Yes     Neidy Guerrieri 05/21/2022, 3:20 PM

## 2022-05-21 NOTE — Progress Notes (Signed)
Mobility Specialist Progress Note:   05/21/22 1035  Mobility  Activity Ambulated with assistance in hallway  Level of Assistance Standby assist, set-up cues, supervision of patient - no hands on  Assistive Device Front wheel walker  Distance Ambulated (ft) 150 ft  LLE Weight Bearing NWB  Activity Response Tolerated fair  Mobility Referral Yes  $Mobility charge 1 Mobility   Pt received in bed and eager. Took 2x standing rest breaks d/t RLE fatigue. No complaints of pain. Pt left in bed with all needs met and call bell in reach.   Jestina Stephani Mobility Specialist-Acute Rehab Secure Chat only

## 2022-05-21 NOTE — Discharge Instructions (Signed)
Follow with Primary MD Redmond School, MD in 7 days   Get CBC, CMP, Magnesium -  checked next visit within 1 week by Primary MD    Activity: As tolerated with Full fall precautions use walker/cane & assistance as needed  Disposition Home    Diet: Heart Healthy Low Carb, check CBGs QAC-HS  Special Instructions: If you have smoked or chewed Tobacco  in the last 2 yrs please stop smoking, stop any regular Alcohol  and or any Recreational drug use.  On your next visit with your primary care physician please Get Medicines reviewed and adjusted.  Please request your Prim.MD to go over all Hospital Tests and Procedure/Radiological results at the follow up, please get all Hospital records sent to your Prim MD by signing hospital release before you go home.  If you experience worsening of your admission symptoms, develop shortness of breath, life threatening emergency, suicidal or homicidal thoughts you must seek medical attention immediately by calling 911 or calling your MD immediately  if symptoms less severe.  You Must read complete instructions/literature along with all the possible adverse reactions/side effects for all the Medicines you take and that have been prescribed to you. Take any new Medicines after you have completely understood and accpet all the possible adverse reactions/side effects.

## 2022-05-21 NOTE — Discharge Summary (Signed)
Mike Floyd HQP:[id number] DOB: [birth year] DOA: 05/13/2022  PCP: Redmond School, MD  Admit date: 05/13/2022  Discharge date: 05/21/2022  Admitted From: Home   Disposition:  Home   Recommendations for Outpatient Follow-up:   Follow up with PCP in 1-2 weeks  PCP Please obtain BMP/CBC, 2 view CXR in 1week,  (see Discharge instructions)   PCP Please follow up on the following pending results:    Home Health: RN,PT if qualifies   Equipment/Devices: see below  Consultations: Ortho Discharge Condition: Stable    CODE STATUS: Full    Diet Recommendation: Heart Healthy - Low Carb    Chief Complaint  Patient presents with   Wound Check     Brief history of present illness from the day of admission and additional interim summary    61 y.o. male with medical history significant for poorly controlled type 2 diabetes, GERD, hypertension, dyslipidemia, CAD, and neuropathy who presented to his PCP office with worsening pain, swelling, and redness to his left foot.  He was redirected here for admission for IV antibiotics due to concern for underlying osteomyelitis.  He is indeed noted to have osteomyelitis to his fourth and fifth metatarsal on MRI.  He was placed on IV antibiotics and transferred from [facility] for further care at [facility] under the Dr. Sharol Given.                                                                 Hospital Course    Left foot acute osteomyelitis of the distal fourth metatarsal head and the 4th proximal phalanx in patient with diabetic foot ulcer requiring left foot transmetatarsal amputation this admission-he was initially treated with IV vancomycin and Rocephin, negative blood cultures, was seen by orthopedics, underwent left foot transmetatarsal amputation by Dr. Sharol Given on 05/19/2022,  Case discussed with Dr. Sharol Given margins clean, stop antibiotics.  He has a  Prevena wound VAC in place which will be continued upon discharge, case discussed with Dr. Sharol Given discharge home with home health on 05/21/2022 follow-up with PCP and Dr. Sharol Given.   CAD/dyslipidemia - Continue Coreg, aspirin and statin, chest pain-free with nonacute EKG, he says he had some heart issues in the past and required a defibrillator placement which has since been removed, echocardiogram done this admission stable with improved EF of 55% and no wall motion abnormality.   Diabetic neuropathy -  Continue home medications of Elavil and gabapentin.   GERD - Continue PPI     BPH - Continue tamsulosin   Hypertension -able continue home regimen unchanged.   DM type II.  In poor control due to hyperglycemia as suggested by A1c, they have diabetic and insulin education continue home regimen, requested to check CBGs q. [facility] and follow-up with PCP within a week for  further titration.   Discharge diagnosis     Principal Problem:   Subacute osteomyelitis, left ankle and foot (HCC) Active Problems:   DM type 2 causing vascular disease (HCC)   GERD (gastroesophageal reflux disease)   AICD (automatic cardioverter/defibrillator)   HTN (hypertension)   Chronic kidney disease (CKD), stage III (moderate) (HCC)   Mixed hyperlipidemia   CAD (coronary artery disease)   Acute osteomyelitis of metatarsal bone of left foot [facility])    Discharge instructions    Discharge Instructions     Discharge instructions   Complete by: As directed    Follow with Primary MD Redmond School, MD in 7 days   Get CBC, CMP, Magnesium -  checked next visit within 1 week by Primary MD    Activity: As tolerated with Full fall precautions use walker/cane & assistance as needed  Disposition Home    Diet: Heart Healthy Low Carb, check CBGs QAC-HS  Special Instructions: If you have smoked or chewed Tobacco  in the last 2 yrs please stop  smoking, stop any regular Alcohol  and or any Recreational drug use.  On your next visit with your primary care physician please Get Medicines reviewed and adjusted.  Please request your Prim.MD to go over all Hospital Tests and Procedure/Radiological results at the follow up, please get all Hospital records sent to your Prim MD by signing hospital release before you go home.  If you experience worsening of your admission symptoms, develop shortness of breath, life threatening emergency, suicidal or homicidal thoughts you must seek medical attention immediately by calling 911 or calling your MD immediately  if symptoms less severe.  You Must read complete instructions/literature along with all the possible adverse reactions/side effects for all the Medicines you take and that have been prescribed to you. Take any new Medicines after you have completely understood and accpet all the possible adverse reactions/side effects.   Discharge wound care:   Complete by: As directed    Kindly keep your operated foot clean and dry, keep your dressing dressing and wound VAC in place follow-up with Dr. Sharol Given within a week for first dressing change.   Increase activity slowly   Complete by: As directed    Negative Pressure Wound Therapy - Incisional   Complete by: As directed    Touch down weight bearing   Complete by: As directed    Laterality: left   Extremity: Lower       Discharge Medications   Allergies as of 05/21/2022   No Known Allergies      Medication List     STOP taking these medications    acetaminophen-codeine 300-30 MG tablet Commonly known as: TYLENOL #3   amoxicillin-clavulanate 875-125 MG tablet Commonly known as: AUGMENTIN   clotrimazole-betamethasone cream Commonly known as: LOTRISONE   insulin degludec 100 UNIT/ML FlexTouch Pen Commonly known as: Tyler Aas FlexTouch   mupirocin ointment 2 % Commonly known as: BACTROBAN   omeprazole 40 MG capsule Commonly known  as: PRILOSEC   SSD 1 % cream Generic drug: silver sulfADIAZINE   sucralfate 1 g tablet Commonly known as: CARAFATE       TAKE these medications    amitriptyline 50 MG tablet Commonly known as: ELAVIL Take 50 mg by mouth at bedtime.   aspirin 81 MG tablet Take 81 mg by mouth daily.   atorvastatin 20 MG tablet Commonly known as: LIPITOR Take 20 mg by mouth daily.   B-D ULTRAFINE III SHORT PEN 31G X 8 MM  Misc Generic drug: Insulin Pen Needle USE WITH TRESBIA.   calcium carbonate 1500 (600 Ca) MG Tabs tablet Commonly known as: OSCAL Take 600 mg of elemental calcium by mouth daily with breakfast.   carvedilol 12.5 MG tablet Commonly known as: COREG TAKE 1 TABLET BY MOUTH TWICE DAILY.   cyanocobalamin 1000 MCG tablet Commonly known as: VITAMIN B12 Take 1,000 mcg by mouth daily.   famotidine 20 MG tablet Commonly known as: PEPCID Take 20 mg by mouth daily.   furosemide 40 MG tablet Commonly known as: LASIX TAKE (1) TABLET BY MOUTH ONCE DAILY. What changed: See the new instructions.   gabapentin 600 MG tablet Commonly known as: NEURONTIN Take 300 mg by mouth 3 (three) times daily.   isosorbide mononitrate 30 MG 24 hr tablet Commonly known as: IMDUR TAKE (1) TABLET BY MOUTH ONCE DAILY. What changed: See the new instructions.   LORazepam 1 MG tablet Commonly known as: ATIVAN Take 1 mg by mouth at bedtime.   nitroGLYCERIN 0.4 MG SL tablet Commonly known as: NITROSTAT Place 0.4 mg under the tongue every 5 (five) minutes as needed for chest pain.   NovoLOG Mix 70/30 FlexPen (70-30) 100 UNIT/ML FlexPen Generic drug: insulin aspart protamine - aspart Inject 50 Units into the skin 2 (two) times daily.   OneTouch Verio test strip Generic drug: glucose blood TESTING 4 TIMES DAILY.   oxycodone 30 MG immediate release tablet Commonly known as: ROXICODONE Take 30-60 mg by mouth 4 (four) times daily as needed for pain.   pantoprazole 40 MG tablet Commonly  known as: PROTONIX Take 40 mg by mouth 2 (two) times daily.   potassium chloride SA 20 MEQ tablet Commonly known as: KLOR-CON M TAKE (1) TABLET BY MOUTH ONCE DAILY.   tamsulosin 0.4 MG Caps capsule Commonly known as: FLOMAX Take 0.4 mg by mouth daily. am               Durable Medical Equipment  (From admission, onward)           Start     Ordered   05/20/22 1605  For home use only DME Walker rolling  Once       Question Answer Comment  Walker: With Shepherd Wheels   Patient needs a walker to treat with the following condition S/P transmetatarsal amputation of foot, left ([place])      05/20/22 1605   05/20/22 1605  For home use only DME 3 n 1  Once        05/20/22 1605              Discharge Care Instructions  (From admission, onward)           Start     Ordered   05/21/22 0000  Discharge wound care:       Comments: Kindly keep your operated foot clean and dry, keep your dressing dressing and wound VAC in place follow-up with Dr. Sharol Given within a week for first dressing change.   05/21/22 0918   05/19/22 0000  Touch down weight bearing       Question Answer Comment  Laterality left   Extremity Lower      05/19/22 1224             Follow-up Information     [name], MD Follow up in 1 week(s).   Specialty: Orthopedic Surgery Contact information: [address] [place] [address] [place] Follow up.   Why: [facility] will call you and set up home health nursing within the next 3 days after returning home. Contact information: [address] [place] [address] [phone]         [facility], [place] Follow up.   Why: [employer] will provide you with a rolling walker and 3:1 today in the hospital before discharge to home. Contact information: [place] [address] [phone]         Redmond School, MD. Schedule an appointment as soon as  possible for a visit in 1 week(s).   Specialty: Internal Medicine Contact information: [address] [place] [address] [phone]         Evans Lance, MD .   Specialty: Cardiology Contact information: [id number] [address] [place] [address] [place] [address] [phone]                 Major procedures and Radiology Reports - PLEASE review detailed and final reports thoroughly  -      ECHOCARDIOGRAM COMPLETE  Result Date: 05/16/2022    ECHOCARDIOGRAM REPORT   Patient Name:   Mike Floyd Date of Exam: 05/16/2022 Medical Rec #:  [id number]     Height:       73.0 in Accession #:    [id number]    Weight:       213.0 lb Date of Birth:  [birth year]    BSA:          2.210 m Patient Age:    66 years      BP:           119/85 mmHg Patient Gender: M             HR:           108 bpm. Exam Location:  Inpatient Procedure: 2D Echo, Cardiac Doppler, Color Doppler and Intracardiac            Opacification Agent Indications:    Congestive Heart Failure  History:        Patient has prior history of Echocardiogram examinations, most                 recent 01/08/2019. Cardiomyopathy, Previous Myocardial Infarction                 and CAD, Defibrillator, PAD; Risk Factors:Former Smoker,                 Hypertension and Diabetes. Chronic kidney disease.  Sonographer:    Darlina Sicilian RDCS Referring Phys: [facility]  Sonographer Comments: Suboptimal apical window, no subcostal window and suboptimal parasternal window. IMPRESSIONS  1. Left ventricular ejection fraction, by estimation, is 50 to 55%. The left ventricle has low normal function. The left ventricle has no regional wall motion abnormalities. Left ventricular diastolic parameters are consistent with Grade I diastolic dysfunction (impaired relaxation).  2. Right ventricular systolic function is normal. The right ventricular size is normal. Tricuspid regurgitation signal is inadequate for assessing PA pressure.  3. The mitral valve  is normal in structure. No evidence of mitral valve regurgitation.  4. The aortic valve is tricuspid. Aortic valve regurgitation is not visualized. No aortic stenosis is present.  5. Aortic dilatation noted. There is borderline dilatation of the aortic root, measuring 39 mm. Comparison(s): Prior images reviewed side by side. The left ventricular function has improved. FINDINGS  Left Ventricle: Left ventricular ejection fraction,  by estimation, is 50 to 55%. The left ventricle has low normal function. The left ventricle has no regional wall motion abnormalities. Definity contrast agent was given IV to delineate the left ventricular endocardial borders. The left ventricular internal cavity size was normal in size. There is no left ventricular hypertrophy. Left ventricular diastolic parameters are consistent with Grade I diastolic dysfunction (impaired relaxation). Normal  left ventricular filling pressure. Right Ventricle: The right ventricular size is normal. No increase in right ventricular wall thickness. Right ventricular systolic function is normal. Tricuspid regurgitation signal is inadequate for assessing PA pressure. Left Atrium: Left atrial size was normal in size. Right Atrium: Right atrial size was normal in size. Pericardium: There is no evidence of pericardial effusion. Mitral Valve: The mitral valve is normal in structure. No evidence of mitral valve regurgitation. Tricuspid Valve: The tricuspid valve is grossly normal. Tricuspid valve regurgitation is not demonstrated. Aortic Valve: The aortic valve is tricuspid. Aortic valve regurgitation is not visualized. No aortic stenosis is present. Pulmonic Valve: The pulmonic valve was not well visualized. Pulmonic valve regurgitation is not visualized. Aorta: Aortic dilatation noted. There is borderline dilatation of the aortic root, measuring 39 mm. Venous: The inferior vena cava was not well visualized. IAS/Shunts: The interatrial septum was not well  visualized. Additional Comments: A is visualized in the right ventricle.  LEFT VENTRICLE PLAX 2D LVIDd:         4.60 cm     Diastology LVIDs:         2.70 cm     LV e' medial:    7.62 cm/s LV PW:         0.90 cm     LV E/e' medial:  11.4 LV IVS:        0.90 cm     LV e' lateral:   8.70 cm/s LVOT diam:     2.10 cm     LV E/e' lateral: 9.9 LV SV:         46 LV SV Index:   21 LVOT Area:     3.46 cm  LV Volumes (MOD) LV vol d, MOD A2C: 53.6 ml LV vol d, MOD A4C: 51.9 ml LV vol s, MOD A2C: 28.7 ml LV vol s, MOD A4C: 26.5 ml LV SV MOD A2C:     24.9 ml LV SV MOD A4C:     51.9 ml LV SV MOD BP:      24.2 ml RIGHT VENTRICLE RV S prime:     12.20 cm/s TAPSE (M-mode): 1.9 cm LEFT ATRIUM             Index LA diam:        3.30 cm 1.49 cm/m LA Vol (A2C):   24.2 ml 10.95 ml/m LA Vol (A4C):   32.2 ml 14.57 ml/m LA Biplane Vol: 29.7 ml 13.44 ml/m  AORTIC VALVE LVOT Vmax:   73.80 cm/s LVOT Vmean:  48.100 cm/s LVOT VTI:    0.133 m  AORTA Ao Root diam: 3.90 cm Ao Asc diam:  3.30 cm MITRAL VALVE MV Area (PHT): 3.37 cm     SHUNTS MV Decel Time: 225 msec     Systemic VTI:  0.13 m MV E velocity: 86.50 cm/s   Systemic Diam: 2.10 cm MV A velocity: 127.00 cm/s MV E/A ratio:  0.68 Mihai Croitoru MD Electronically signed by Sanda Klein MD Signature Date/Time: 05/16/2022/11:28:43 AM    Final    MR FOOT LEFT W WO CONTRAST  Result Date: 05/13/2022  CLINICAL DATA:  Foot swelling. Nondiabetic. Osteomyelitis suspected. Lytic destruction involving the distal portion of fourth metatarsal and proximal base of the fourth proximal phalanx concerning for osteomyelitis. EXAM: MRI OF THE LEFT FOREFOOT WITHOUT AND WITH CONTRAST TECHNIQUE: Multiplanar, multisequence MR imaging of the left was performed both before and after administration of intravenous contrast. CONTRAST:  9 ml Vueway gadolinium IV COMPARISON:  Left foot radiographs 05/13/2022 and 10/06/2021 FINDINGS: Bones/Joint/Cartilage There is a plantar soft tissue ulcer and sinus tract  (described below) extending to the fourth metatarsal head and metatarsophalangeal joint. There is moderate cortical erosion and fragmentation of the fourth metatarsal head, with multiple apparent fracture line lucencies (sagittal image 22) and approximately 3 mm dorsal cortical step-off of the distal aspect of the fourth metatarsal head with respect to the fourth metatarsal neck. There is also fragmentation of the proximal phalanx of the great toe. Moderate to high-grade fourth metatarsophalangeal joint effusion. There is high-grade marrow edema throughout the entire proximal phalanx of the fourth toe and the distal aspect of the fourth metatarsal extending all the way proximally to the proximal metaphysis of the fourth metatarsal (sagittal image 22). Possible marrow edema within portions of the fourth toe middle and distal phalanges, however evaluation is limited by patient motion artifact and the small size of these joints. There also associated degenerative changes of the interphalangeal joints there may be a cause for subchondral marrow edema. There is marrow edema within the proximal 40% of the proximal phalanx of the fifth digit with proximal cortical erosion concerning for osteomyelitis. Small fifth metatarsophalangeal joint effusion. Mild marrow edema within the plantar aspect of the fifth metatarsal head. No definitive cortical destruction within the fifth metatarsal head, and this could be degenerative edema, however osteomyelitis cannot be excluded. There is curvilinear fluid bright signal across a nonunited chronic appearing fracture of the proximal metaphysis of the second metatarsal, also seen on prior radiographs with high-grade surrounding callus formation. 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 Subchondral cystic change and surrounding marrow edema within the lateral cuneiform at the third tarsometatarsal joint. Ligaments There is metallic artifact related to the  previously seen surgical staple fixating the first tarsometatarsal joint. There is scattered metallic artifact across the medial cuneiform-base of the second metatarsal Lisfranc ligament complex. Muscles and Tendons The visualized second extensor tendons appear intact. There is diffuse fatty infiltration of the visualized midfoot and forefoot musculature. Soft tissues There is an ulceration within the forefoot soft tissues plantar to the fourth metatarsal head and metatarsophalangeal joint, measuring up to 13 mm in transverse dimension and 15 mm in AP dimension (series 10, image 20 and series 9, image 22). There is a sinus tract extending from this region directly to the distal fourth metatarsal head and metatarsophalangeal joint. Moderate edema and swelling of the dorsal midfoot subcutaneous fat. IMPRESSION: 1. There is a plantar soft tissue ulcer at the level of the fourth metatarsal head and metatarsophalangeal joint with sinus tract extending to the fourth metatarsal head and metatarsophalangeal joint. There is cortical destruction and marrow edema indicating acute osteomyelitis of the distal fourth metatarsal head and the 4th proximal phalanx. The metatarsal edema extends all the way proximally to the proximal metaphysis. There is also a moderate to large fourth metatarsophalangeal joint effusion consistent with septic arthritis. 2. Additional marrow edema and probable cortical erosion within the proximal fifth metatarsal also concerning for osteomyelitis. Tiny fifth metatarsophalangeal joint effusion. Mild marrow edema within the plantar aspect of the fifth metatarsal head may be  secondary to degenerative changes, however it is difficult to exclude early minimal osteomyelitis. 3. Chronic nonunited fracture of the proximal second metatarsal. Electronically Signed   By: Yvonne Kendall M.D.   On: 05/13/2022 19:10   US ARTERIAL ABI (SCREENING LOWER EXTREMITY)  Result Date: 05/13/2022 CLINICAL DATA:  Peripheral  vascular disease EXAM: NONINVASIVE PHYSIOLOGIC VASCULAR STUDY OF BILATERAL LOWER EXTREMITIES TECHNIQUE: Evaluation of both lower extremities were performed at rest, including calculation of ankle-brachial indices with single level Doppler, pressure and pulse volume recording. COMPARISON:  06/14/2019 FINDINGS: Right ABI:  1.27 Left ABI:  1.24 Right Lower Extremity: Multiphasic arterial waveform in the posterior tibial artery. Monophasic in dorsalis pedis. Left Lower Extremity: Multiphasic posterior tibial waveform. Monophasic dorsalis pedis. IMPRESSION: No evidence of hemodynamically significant lower extremity arterial occlusive disease at rest. Electronically Signed   By: Lucrezia Europe M.D.   On: 05/13/2022 14:49   DG Foot Complete Left  Result Date: 05/13/2022 CLINICAL DATA:  Left foot wound. EXAM: LEFT FOOT - COMPLETE 3+ VIEW COMPARISON:  October 06, 2021. FINDINGS: Status post surgical fusion of the first tarsometatarsal joint. There appears to be lytic destruction involving the distal portion of the fourth metatarsal and the proximal base of the fourth proximal phalanx concerning for osteomyelitis. Mild posterior calcaneal spurring is noted. IMPRESSION: Lytic destruction involving distal portion of fourth metatarsal and proximal base of fourth proximal phalanx concerning for osteomyelitis. Electronically Signed   By: Marijo Conception M.D.   On: 05/13/2022 12:49   DG Chest Port 1 View  Result Date: 05/13/2022 CLINICAL DATA:  Sepsis. EXAM: PORTABLE CHEST 1 VIEW COMPARISON:  Dec 29, 2017. FINDINGS: The heart size and mediastinal contours are within normal limits. Both lungs are clear. The visualized skeletal structures are unremarkable. IMPRESSION: No active disease. Electronically Signed   By: Marijo Conception M.D.   On: 05/13/2022 12:47    Micro Results    Recent Results (from the past 240 hour(s))  Blood Culture (routine x 2)     Status: None   Collection Time: 05/13/22 12:07 PM   Specimen: BLOOD   Result Value Ref Range Status   Specimen Description BLOOD RIGHT ARM  Final   Special Requests   Final    BOTTLES DRAWN AEROBIC AND ANAEROBIC Blood Culture results may not be optimal due to an excessive volume of blood received in culture bottles   Culture   Final    NO GROWTH 5 DAYS Performed at [facility], [address] [place], [place] [address]    Report Status 05/18/2022 FINAL  Final  Blood Culture (routine x 2)     Status: None   Collection Time: 05/13/22 12:12 PM   Specimen: BLOOD  Result Value Ref Range Status   Specimen Description BLOOD LEFT ARM  Final   Special Requests   Final    BOTTLES DRAWN AEROBIC AND ANAEROBIC Blood Culture results may not be optimal due to an excessive volume of blood received in culture bottles   Culture   Final    NO GROWTH 5 DAYS Performed at [facility], [address] [place], [place] [address]    Report Status 05/18/2022 FINAL  Final  Surgical pcr screen     Status: None   Collection Time: 05/18/22 11:00 PM   Specimen: Nasal Mucosa; Nasal Swab  Result Value Ref Range Status   MRSA, PCR NEGATIVE NEGATIVE Final   Staphylococcus aureus NEGATIVE NEGATIVE Final    Comment: (NOTE) The Xpert SA Assay (FDA approved  for NASAL specimens in patients 78 years of age and older), is one component of a comprehensive surveillance program. It is not intended to diagnose infection nor to guide or monitor treatment. Performed at [facility], [place] [address], [place], [place] [address]     Today   Subjective    Mike Floyd today has no headache,no chest abdominal pain,no new weakness tingling or numbness, feels much better wants to go home today.    Objective   Blood pressure 119/76, pulse 90, temperature 97.7 F (36.5 C), temperature source Oral, resp. rate 18, height '6\' 1"'$  (1.854 m), weight 96.6 kg, SpO2 98 %.   Intake/Output Summary (Last 24 hours) at 05/21/2022 0918 Last data filed at 05/21/2022 0710 Gross per 24 hour  Intake  340 ml  Output 2325 ml  Net -1985 ml    Exam  Awake Alert, No new F.N deficits,    [place].AT,PERRAL Supple Neck,   Symmetrical Chest wall movement, Good air movement bilaterally, CTAB RRR,No Gallops,   +ve B.Sounds, Abd Soft, Non tender,  Left foot with transmetatarsal amputation site under bandage with Prevena wound VAC   Data Review   Recent Labs  Lab 05/16/22 0818 05/17/22 0033 05/18/22 0237 05/19/22 0511 05/20/22 0545  WBC 5.5 6.7 7.5 6.7 6.1  HGB 14.4 13.8 15.0 15.3 13.6  HCT 42.2 39.9 42.2 45.3 38.7*  PLT 177 187 186 178 155  MCV 85.4 84.4 81.9 84.8 83.8  MCH 29.1 29.2 29.1 28.7 29.4  MCHC 34.1 34.6 35.5 33.8 35.1  RDW 13.8 13.5 13.7 14.0 13.9  LYMPHSABS  --  1.6 1.6 1.2 1.0  MONOABS  --  0.6 0.7 0.7 0.5  EOSABS  --  0.1 0.1 0.2 0.2  BASOSABS  --  0.1 0.1 0.1 0.1    Recent Labs  Lab 05/16/22 0615 05/16/22 0818 05/17/22 0033 05/18/22 0237 05/19/22 0511 05/20/22 0545  NA 139  --  137 137 139 136  K 3.7  --  3.4* 3.3* 4.2 3.8  CL 108  --  105 103 105 103  CO2 25  --  '24 23 26 '$ 21*  GLUCOSE 79  --  161* 81 118* 183*  BUN <5*  --  7* '8 9 10  '$ CREATININE 0.93  --  1.19 1.22 1.21 0.95  CALCIUM 9.2  --  8.9 9.1 9.4 9.0  MG 1.9  --  1.8 1.9 2.0 1.7  CRP  --  0.6  --   --   --   --   PROCALCITON  --  <0.10 <0.10 <0.10  --   --   INR 1.2  --   --   --   --   --   BNP  --   --  20.8 11.6 12.2 35.8    Total Time in preparing paper work, data evaluation and todays exam - 65 minutes  Lala Lund M.D on 05/21/2022 at 9:18 AM  [facility]

## 2022-05-21 NOTE — Progress Notes (Signed)
Physical Therapy Treatment Patient Details Name: Mike Floyd MRN: [id number] DOB: 1961-02-01 Today's Date: 05/21/2022   History of Present Illness Pt is a 61 y.o. male admitted 10/5 with L foot  fourth and fifth metatarsal osteomyelitis. s/p L TMA on 10/11. PMH:  poorly controlled type 2 diabetes, GERD, hypertension, dyslipidemia, CAD, and neuropathy.    PT Comments    Pt received in supine, agreeable to therapy session after recently working with mobility specialist and with good participation and tolerance for transfer and gait training along with stair negotiation.  Pt unsteady with crutches with gait/stairs in room and recommend he use RW only until WB restrictions updated/advanced. Pt needing up to min guard to perform gait and stair negotiation with RW/bilateral rails and with improved compliance with TWB/NWB precs using RW. Pt receptive to instruction. Pt continues to benefit from PT services to progress toward functional mobility goals.   Recommendations for follow up therapy are one component of a multi-disciplinary discharge planning process, led by the attending physician.  Recommendations may be updated based on patient status, additional functional criteria and insurance authorization.  Follow Up Recommendations  No PT follow up     Assistance Recommended at Discharge PRN  Patient can return home with the following Assist for transportation;Help with stairs or ramp for entrance;Assistance with cooking/housework;A little help with walking and/or transfers   Equipment Recommendations  Rolling walker (2 wheels)    Recommendations for Other Services       Precautions / Restrictions Precautions Precautions: Fall Precaution Comments: HoH Required Braces or Orthoses: Other Brace Other Brace: post-op shoe Restrictions Weight Bearing Restrictions: Yes LLE Weight Bearing: Touchdown weight bearing LLE Partial Weight Bearing Percentage or Pounds: performs NWB better than TDWB  secondary to neuropathy, tends to put too much weight on L foot when attempts TDWB     Mobility  Bed Mobility Overal bed mobility: Modified Independent                  Transfers Overall transfer level: Needs assistance Equipment used: Rolling walker (2 wheels), Crutches Transfers: Sit to/from Stand Sit to Stand: Min guard, Min assist           General transfer comment: close guard for safety. No physical assist required wtih RW but with crutches needing up to minA for safety    Ambulation/Gait Ambulation/Gait assistance: Min assist, Mod assist, Min guard Gait Distance (Feet): 80 Feet (43f with crutches, then ~856fwith RW) Assistive device: Rolling walker (2 wheels), Crutches Gait Pattern/deviations: Step-to pattern, Decreased stride length, Antalgic       General Gait Details: cues for upright posture, improved adherence to precautions with more conservative NWB. very unsteady with crutches needing minA to modA due to instability and pt multiple times tried to place front of his LLE down to catch balance rather than his heel, necessitating PTA to assist with gait belt. Pt also with poor mgmt of crutches and unsteady using crutches for stand>sit, pt much steadier using RW after seated break in room and able to maintain NWB with min guard for safety at gait belt   Stairs Stairs: Yes Stairs assistance: Mod assist, Min assist Stair Management: With crutches, With walker, Forwards, Step to pattern Number of Stairs: 3 General stair comments: pt ascended/descended single 7" step in room x1 rep with crutches and x3 reps with BUE support of RW to simulate B handrails. Pt instructed on hop-to pattern and good carryover using RW needing up to light minA for safety.  Pt encouraged NOT to use crutches on stairs at home due to significant loss of balance with initial attempt at 1 step using crutches and pt requiring modA to correct balance with difficulty maintaining TWB precs when  attempting to regain his balance.   Wheelchair Mobility    Modified Rankin (Stroke Patients Only)       Balance Overall balance assessment: Needs assistance Sitting-balance support: Feet supported, No upper extremity supported Sitting balance-Leahy Scale: Good Sitting balance - Comments: able to doff post-op shoe sitting EOB no loss of balance   Standing balance support: During functional activity, Bilateral upper extremity supported Standing balance-Leahy Scale: Fair Standing balance comment: RW for amb due to WB precs; poor with crutches, fair with RW                            Cognition Arousal/Alertness: Awake/alert Behavior During Therapy: WFL for tasks assessed/performed Overall Cognitive Status: Within Functional Limits for tasks assessed                                 General Comments: HOH but conversational and pleasant.        Exercises      General Comments General comments (skin integrity, edema, etc.): VSS on RA      Pertinent Vitals/Pain Pain Assessment Pain Assessment: 0-10 Pain Score: 6  Pain Location: resting/prior to OOB (recently worked with mobility specialist) Pain Descriptors / Indicators: Discomfort, Grimacing Pain Intervention(s): Monitored during session, Repositioned, Limited activity within patient's tolerance, Patient requesting pain meds-RN notified     PT Goals (current goals can now be found in the care plan section) Acute Rehab PT Goals Patient Stated Goal: to get better and go home so I can help my wife. PT Goal Formulation: With patient Time For Goal Achievement: 05/30/22 Progress towards PT goals: Progressing toward goals    Frequency    Min 3X/week      PT Plan Current plan remains appropriate       AM-PAC PT "6 Clicks" Mobility   Outcome Measure  Help needed turning from your back to your side while in a flat bed without using bedrails?: None Help needed moving from lying on your back  to sitting on the side of a flat bed without using bedrails?: None Help needed moving to and from a bed to a chair (including a wheelchair)?: A Little Help needed standing up from a chair using your arms (e.g., wheelchair or bedside chair)?: A Little Help needed to walk in hospital room?: A Little (with RW) Help needed climbing 3-5 steps with a railing? : A Little (with RW/bilateral rails) 6 Click Score: 20    End of Session Equipment Utilized During Treatment: Gait belt;Other (comment) (post-op shoe) Activity Tolerance: Patient tolerated treatment well;Patient limited by pain Patient left: in bed;with call bell/phone within reach;with bed alarm set (LLE elevated) Nurse Communication: Mobility status;Patient requests pain meds;Weight bearing status PT Visit Diagnosis: Pain;Difficulty in walking, not elsewhere classified (R26.2) Pain - Right/Left: Left Pain - part of body: Ankle and joints of foot     Time: 7989-2119 PT Time Calculation (min) (ACUTE ONLY): 24 min  Charges:  $Gait Training: 23-37 mins                     Caine Barfield P., PTA Acute Rehabilitation Services Secure Chat Preferred 9a-5:30pm Office: [phone]  Kara Pacer Talina Pleitez 05/21/2022, 1:37 PM

## 2022-05-21 NOTE — TOC Transition Note (Addendum)
Transition of Care [facility]) - CM/SW Discharge Note   Patient Details  Name: Mike Floyd MRN: [id number] Date of Birth: 03-20-1961  Transition of [facility]) CM/SW Contact:  Curlene Labrum, RN Phone Number: 05/21/2022, 9:51 AM   Clinical Narrative:    Patient will be discharging home with the patient's family today.  Discharge orders have been placed and the patient has RW and 3:1 delivered to the room to take home.  I called Lillia Mountain, CM with [facility] and she is checking on availability for RN for home health.  The patient has a Prevena wound vac in place and will follow up with Dr. Sharol Given in a week.  The patient's family will be providing transportation to the home by car.  Bedside nursing or patient can call family for ride home this morning.  [place] is unable to provide home health services due to staffing issues in the area.  I called Tommi Rumps, CM with [facility] and they are willing to provide home health RN after wound vac is removed in a week.     Final next level of care: [place] Barriers to Discharge: Continued Medical Work up   Patient Goals and CMS Choice Patient states their goals for this hospitalization and ongoing recovery are:: To return home. CMS Medicare.gov Compare Post Acute Care list provided to:: Patient Choice offered to / list presented to : Patient  Discharge Placement                       Discharge Plan and Services   Discharge Planning Services: CM Consult Post Acute Care Choice: Durable Medical Equipment, Home Health          DME Arranged: 3-N-1, Gilford Rile DME Agency: [facility] [employer] Date DME Agency Contacted: 05/20/22 Time DME Agency Contacted: [phone] Representative spoke with at DME Agency: Brenton Grills, Craig with [facility] HH Arranged: RN [place] Agency: [place] ([place]) Date [place]: 05/20/22 Time [place]: 1608 Representative spoke with at [place]: [place], CM  Social Determinants of Health (SDOH) Interventions     Readmission Risk Interventions    05/20/2022    4:09 PM  Readmission Risk Prevention Plan  Post Dischage Appt Complete  Medication Screening Complete  Transportation Screening Complete

## 2022-05-21 NOTE — Care Management Important Message (Signed)
Important Message  Patient Details  Name: Mike Floyd MRN: [id number] Date of Birth: [birth year]   Medicare Important Message Given:  Yes  Patient left prior to IM delivery will mail to the patient home address.   [name] 05/21/2022, 3:20 PM

## 2022-05-26 ENCOUNTER — Encounter (HOSPITAL_COMMUNITY): Payer: Self-pay | Admitting: Physical Therapy

## 2022-05-26 ENCOUNTER — Ambulatory Visit: Payer: Medicare Other | Admitting: Podiatry

## 2022-05-26 NOTE — Therapy (Signed)
[facility] [place], [place], [address] Phone: [phone]   Fax:  [phone]  Patient Details  Name: Mike Floyd MRN: [id number] Date of Birth: August 22, 1960 Referring Provider:  No ref. provider found  Encounter Date: 05/26/2022  PHYSICAL THERAPY DISCHARGE SUMMARY  Visits from Start of Care: 3  Current functional level related to goals / functional outcomes: Patient has not returned   Remaining deficits: Patient has not returned   Education / Equipment: Patient has not returned   Patient agrees to discharge. Patient goals were not met. Patient is being discharged due to not returning since the last visit.  11:28 AM, 05/26/22 Mearl Latin PT, DPT Physical Therapist at [facility] [address] [place], [place], [address] Phone: [phone]   Fax:  [phone]

## 2022-05-27 ENCOUNTER — Ambulatory Visit (INDEPENDENT_AMBULATORY_CARE_PROVIDER_SITE_OTHER): Payer: Medicare Other | Admitting: Orthopedic Surgery

## 2022-05-27 DIAGNOSIS — Z89432 Acquired absence of left foot: Secondary | ICD-10-CM

## 2022-05-31 MED ORDER — BUPIVACAINE-EPINEPHRINE (PF) 0.5% -1:200000 IJ SOLN
INTRAMUSCULAR | Status: DC | PRN
  Administered 2022-05-19: 30 mL via PERINEURAL

## 2022-05-31 NOTE — Addendum Note (Signed)
Addendum  created 05/31/22 0745 by Albertha Ghee, MD   Child order released for a procedure order, Clinical Note Signed, Intraprocedure Blocks edited, Intraprocedure Meds edited, Order Canceled from Note, SmartForm saved

## 2022-05-31 NOTE — Anesthesia Procedure Notes (Signed)
Anesthesia Regional Block: Popliteal block   Pre-Anesthetic Checklist: , timeout performed,  Correct Patient, Correct Site, Correct Laterality,  Correct Procedure, Correct Position, site marked,  Risks and benefits discussed,  Surgical consent,  Pre-op evaluation,  At surgeon's request and post-op pain management  Laterality: Left  Prep: chloraprep       Needles:  Injection technique: Single-shot  Needle Type: Echogenic Stimulator Needle          Additional Needles:   Procedures:, nerve stimulator,,,,,     Nerve Stimulator or Paresthesia:  Response: plantar flexion of foot, 0.45 mA  Additional Responses:   Narrative:  Start time: 05/19/2022 9:10 AM End time: 05/19/2022 9:20 AM Injection made incrementally with aspirations every 5 mL.  Performed by: Personally  Anesthesiologist: Albertha Ghee, MD  Additional Notes: Functioning IV was confirmed and monitors were applied.  A 25m 21ga Arrow echogenic stimulator needle was used. Sterile prep and drape,hand hygiene and sterile gloves were used.  Negative aspiration and negative test dose prior to incremental administration of local anesthetic. The patient tolerated the procedure well.  Ultrasound guidance: relevent anatomy identified, needle position confirmed, local anesthetic spread visualized around nerve(s), vascular puncture avoided.  Image printed for medical record.

## 2022-06-01 ENCOUNTER — Encounter: Payer: Self-pay | Admitting: Orthopedic Surgery

## 2022-06-01 NOTE — Progress Notes (Signed)
Office Visit Note   Patient: Mike Floyd           Date of Birth: 26-Apr-1961           MRN: [id number] Visit Date: 05/27/2022              Requested by: Redmond [address], [place],  [place] [address] PCP: Red[facility] School, MD  Chief Complaint  Patient presents with   Left Foot - Routine Post Op    05/19/22 left transmet amputation      HPI: Patient is 1 week status post left transverse metatarsal amputation.  Patient denies any pain.  Assessment & Plan: Visit Diagnoses:  1. History of transmetatarsal amputation of left foot ([place])     Plan: Continue protected weightbearing Dial soap soaks dry dressing change daily.  Anticipate removal of sutures in 1 week.  Follow-Up Instructions: Return in about 1 week (around 06/03/2022).   Ortho Exam  Patient is alert, oriented, no adenopathy, well-dressed, normal affect, normal respiratory effort. Examination there is swelling was slight wound dehiscence.  Recommended elevation.  No signs of infection.  Imaging: No results found.   Labs: Lab Results  Component Value Date   HGBA1C 9.7 (H) 05/13/2022   HGBA1C 7.0 (H) 06/15/2017   HGBA1C 5.2 03/15/2013   CRP 0.6 05/16/2022   CRP 2.2 06/27/2019   LABURIC 5.5 03/07/2012   REPTSTATUS 05/18/2022 FINAL 05/13/2022   CULT  05/13/2022    NO GROWTH 5 DAYS Performed at [facility], [address] [place], [place] [address]      Lab Results  Component Value Date   ALBUMIN 4.2 05/13/2022   ALBUMIN 4.6 12/27/2013   ALBUMIN 4.4 05/30/2013    Lab Results  Component Value Date   MG 1.7 05/20/2022   MG 2.0 05/19/2022   MG 1.9 05/18/2022   Lab Results  Component Value Date   VD25OH 18 (L) 06/15/2017    No results found for: "PREALBUMIN"    Latest Ref Rng & Units 05/20/2022    5:45 AM 05/19/2022    5:11 AM 05/18/2022    2:37 AM  CBC EXTENDED  WBC 4.0 - 10.5 K/uL 6.1  6.7  7.5   RBC 4.22 - 5.81 MIL/uL 4.62  5.34  5.15   Hemoglobin 13.0 - 17.0 g/dL  13.6  15.3  15.0   HCT 39.0 - 52.0 % 38.7  45.3  42.2   Platelets 150 - 400 K/uL 155  178  186   NEUT# 1.7 - 7.7 K/uL 4.3  4.6  4.9   Lymph# 0.7 - 4.0 K/uL 1.0  1.2  1.6      There is no height or weight on file to calculate BMI.  Orders:  No orders of the defined types were placed in this encounter.  No orders of the defined types were placed in this encounter.    Procedures: No procedures performed  Clinical Data: No additional findings.  ROS:  All other systems negative, except as noted in the HPI. Review of Systems  Objective: Vital Signs: There were no vitals taken for this visit.  Specialty Comments:  No specialty comments available.  PMFS History: Patient Active Problem List   Diagnosis Date Noted   Acute osteomyelitis of metatarsal bone of left foot (HCC)    Subacute osteomyelitis, left ankle and foot ([place]) 05/13/2022   Non-pressure chronic ulcer of other part of right foot limited to breakdown of skin (HCC)    Contracture of  right Achilles tendon    Mildly dilatd aortic root (HCC)    Contracture of left Achilles tendon    Metatarsal deformity, left    CAD (coronary artery disease)    Hallux hammertoe, left 12/11/2019   Diarrhea 06/27/2019   Diastasis recti 86/76/1950   Umbilical hernia without obstruction and without gangrene 08/22/2018   Vitamin D deficiency 06/23/2017   Essential hypertension, benign 06/15/2017   Mixed hyperlipidemia 06/15/2017   Class 1 obesity due to excess calories with serious comorbidity and body mass index (BMI) of 31.0 to 31.9 in adult 06/15/2017   Malfunction of implantable cardioverter-defibrillator (ICD) electrode 10/07/2016   Chronic kidney disease (CKD), stage III (moderate) ([place]) 04/04/2015   Bladder neck obstruction 03/21/2015   Difficult or painful urination 03/21/2015   Flank pain 03/21/2015   Delayed onset of urination 03/21/2015   Calculus of kidney 03/21/2015   Erosive esophagitis 12/27/2013   Hypotension due  to drugs 93/26/7124   Chronic systolic heart failure ([place]) 04/24/2013   CAP (community acquired pneumonia) 03/16/2013   HTN (hypertension) 03/15/2013   Hydrocele 03/15/2013   Spermatocele 03/15/2013   DOE (dyspnea on exertion) 03/15/2013   OSA (obstructive sleep apnea) 01/09/2013   Chronic kidney disease, stage 3 ([place]) 01/03/2013   Chest pain 12/26/2012   History of diagnostic tests 09/06/2012   TIA (transient ischemic attack)    Cardiomyopathy, nonischemic ([place]) 02/24/2010   AICD (automatic cardioverter/defibrillator) 02/24/2010   DM type 2 causing vascular disease ([place]) 06/27/2009   Gout 06/27/2009   Tobacco abuse, in remission 06/27/2009   COLONIC POLYPS 10/31/2008   GERD (gastroesophageal reflux disease) 10/31/2008   Past Medical History:  Diagnosis Date   AICD (automatic cardioverter/defibrillator) present 11/2007   a. 11/2007 SJM Current VR - single lead ICD  - Removed 2018 - "it was burning me"   Anxiety    CAD (coronary artery disease)    non-obstructive CAD by Cor CT in 2020 // Myoview 3/22: EF 57, small inf-sept defect c/w scar, no ischemia; low risk      Chest pain    a. 10/2007 Cath:  normal Cors.   CKD (chronic kidney disease), stage II    DDD (degenerative disc disease), lumbar    Diabetes mellitus DX: 2010   Erosive esophagitis    a. per EGD (08/2011), Dr. Laural Golden - Erosive reflux esophagitis improved but not completely healed since previous EGD 3 years ago. Bx showing  ulcerated gatroesophageal junction mucosa. negative for H. pylori   GERD (gastroesophageal reflux disease)    Gout    Hearing deficit    a. wear bilateral hearing aides   History of hiatal hernia    Hypertension    Mildly dilatd aortic root ([place])    CMR 4/22: EF 52, no LGE; d/w Dr. Marlowe Kays root 38 mm (mildly dilated)   Myocardial infarction [facility]) 2011   Neuropathy    Feet and legs   Nonischemic dilated cardiomyopathy ([place])    a. H/O EF as low as 35-40% by LV gram 10/2007;  b. Echo 02/2011 EF  50-55%, inf HK, Gr 1 DD // CMR 4/22: EF 52, no LGE; d/w Dr. Marlowe Kays root 38 mm (mildly dilated)    Renal insufficiency    Sleep apnea    pt doesnt use, states"I cant afford one". PCP aware   Stroke ([place])    mini-stroke in 2014   TIA (transient ischemic attack)    July, 2013   Tobacco abuse, in remission 06/27/2009   Discontinued in 2009  Wears dentures    top plate   WPW (Wolff-Parkinson-White syndrome)    a. s/p RFCA @ [facility]    Family History  Problem Relation Age of Onset   Heart attack Mother 36   Hypertension Mother    Diabetes Mother    Kidney disease Mother    Breast cancer Mother    Heart attack Father 69   Hypertension Father    Diabetes Father    Stroke Brother    Heart attack Brother 47   Stroke Maternal Grandmother 63   Diabetes Brother    Hypertension Brother     Past Surgical History:  Procedure Laterality Date   AMPUTATION Left 05/19/2022   Procedure: TRANSMETATARSAL AMPUTATION LEFT FOOT;  Surgeon: [name], MD;  Location: [place];  Service: Orthopedics;  Laterality: Left;   [place]     2009   CARDIAC DEFIBRILLATOR PLACEMENT     CHOLECYSTECTOMY     CHOLECYSTECTOMY, LAPAROSCOPIC     11/2007   COLONOSCOPY W/ POLYPECTOMY  2009   ELBOW SURGERY Left 06/2010   ESOPHAGEAL DILATION N/A 11/08/2014   Procedure: ESOPHAGEAL DILATION;  Surgeon: Rogene Houston, MD;  Location: [facility];  Service: Endoscopy;  Laterality: N/A;  #56,    ESOPHAGOGASTRODUODENOSCOPY  03/31/2012   also 08/2011; [name]   ESOPHAGOGASTRODUODENOSCOPY (EGD) WITH PROPOFOL N/A 11/08/2014   Procedure: ESOPHAGOGASTRODUODENOSCOPY (EGD) WITH PROPOFOL;  Surgeon: Rogene Houston, MD;  Location: [facility];  Service: Endoscopy;  Laterality: N/A;  [place] is 6 , [place] is 37   FOOT ARTHRODESIS Left 10/24/2020   Procedure: LEFT GASTROCNEMIUS RECESSION, DORSIFLEXION OSTEOTOMY 1ST MT;  Surgeon: [name], MD;  Location: [facility];   Service: Orthopedics;  Laterality: Left;   FOOT ARTHRODESIS Right 01/09/2021   Procedure: CLOSING WEDGE OSTEOTOMY RIGHT 1ST METATARSAL;  Surgeon: [name], MD;  Location: [facility];  Service: Orthopedics;  Laterality: Right;   GASTROCNEMIUS RECESSION Right 01/09/2021   Procedure: RIGHT GASTROCNEMIUS RECESSION;  Surgeon: [name], MD;  Location: [facility];  Service: Orthopedics;  Laterality: Right;   ICD LEAD REMOVAL N/A 10/07/2016   Procedure: ICD LEAD REMOVAL ;  Surgeon: Evans Lance, MD;  Location: [facility];  Service: Cardiovascular;  Laterality: N/A;   [place]; performed at [facility]   TEE WITHOUT CARDIOVERSION N/A 10/07/2016   Procedure: TRANSESOPHAGEAL ECHOCARDIOGRAM (TEE);  Surgeon: Evans Lance, MD;  Location: [facility];  Service: Cardiovascular;  Laterality: N/A;   Social History   Occupational History   Occupation: Worked for [employer]: [place]: Laid off in 11/11  Tobacco Use   Smoking status: Former    Packs/day: 1.50    Years: 35.00    Total pack years: 52.50    Types: Cigarettes    Start date: 06/30/1971    Quit date: 07/30/2010    Years since quitting: 11.8   Smokeless tobacco: Former    Types: Chew    Quit date: 03/26/1994  Vaping Use   Vaping Use: Former   Start date: 07/30/2010   Quit date: 06/29/2016  Substance and Sexual Activity   Alcohol use: No    Alcohol/week: 1.0 standard drink of alcohol    Types: 1 Cans of beer per week    Comment: used "years ago"   Drug use:  No   Sexual activity: Never    Birth control/protection: None

## 2022-06-03 ENCOUNTER — Encounter: Payer: Self-pay | Admitting: Orthopedic Surgery

## 2022-06-03 ENCOUNTER — Ambulatory Visit (INDEPENDENT_AMBULATORY_CARE_PROVIDER_SITE_OTHER): Payer: Medicare Other | Admitting: Orthopedic Surgery

## 2022-06-03 DIAGNOSIS — Z89432 Acquired absence of left foot: Secondary | ICD-10-CM

## 2022-06-03 NOTE — Progress Notes (Signed)
Office Visit Note   Patient: Mike Floyd           Date of Birth: July 05, 1961           MRN: [id number] Visit Date: 06/03/2022              Requested by: [facility], [address],  [place] [id number] PCP: Redmond School, MD  Chief Complaint  Patient presents with   Left Foot - Routine Post Op    05/19/2022 left foot transmet amputation       HPI: Patient is a 61 year old gentleman who presents 3 weeks status post left foot transmetatarsal amputation with Kerecis micro graft.  Patient recently fell directly on the residual limb yesterday he has had bleeding swelling redness.  Assessment & Plan: Visit Diagnoses:  1. History of transmetatarsal amputation of left foot ([place])     Plan: Continue Dial soap cleansing nonweightbearing elevation and dry dressing changes.  Follow-Up Instructions: Return in about 1 week (around 06/10/2022).   Ortho Exam  Patient is alert, oriented, no adenopathy, well-dressed, normal affect, normal respiratory effort. Examination there is slight wound dehiscence.  There is 1 area in the mid substance with a 2 cm diameter eschar.  There is no cellulitis.  There is healthy granulation tissue along the surgical incision which is gaped open about 5 mm.  Imaging: No results found.   Labs: Lab Results  Component Value Date   HGBA1C 9.7 (H) 05/13/2022   HGBA1C 7.0 (H) 06/15/2017   HGBA1C 5.2 03/15/2013   CRP 0.6 05/16/2022   CRP 2.2 06/27/2019   LABURIC 5.5 03/07/2012   REPTSTATUS 05/18/2022 FINAL 05/13/2022   CULT  05/13/2022    NO GROWTH 5 DAYS Performed at [facility], [address], [place], [place] [address]      Lab Results  Component Value Date   ALBUMIN 4.2 05/13/2022   ALBUMIN 4.6 12/27/2013   ALBUMIN 4.4 05/30/2013    Lab Results  Component Value Date   MG 1.7 05/20/2022   MG 2.0 05/19/2022   MG 1.9 05/18/2022   Lab Results  Component Value Date   VD25OH 18 (L) 06/15/2017    No results found  for: "PREALBUMIN"    Latest Ref Rng & Units 05/20/2022    5:45 AM 05/19/2022    5:11 AM 05/18/2022    2:37 AM  CBC EXTENDED  WBC 4.0 - 10.5 K/uL 6.1  6.7  7.5   RBC 4.22 - 5.81 MIL/uL 4.62  5.34  5.15   Hemoglobin 13.0 - 17.0 g/dL 13.6  15.3  15.0   HCT 39.0 - 52.0 % 38.7  45.3  42.2   Platelets 150 - 400 K/uL 155  178  186   NEUT# 1.7 - 7.7 K/uL 4.3  4.6  4.9   Lymph# 0.7 - 4.0 K/uL 1.0  1.2  1.6      There is no height or weight on file to calculate BMI.  Orders:  No orders of the defined types were placed in this encounter.  No orders of the defined types were placed in this encounter.    Procedures: No procedures performed  Clinical Data: No additional findings.  ROS:  All other systems negative, except as noted in the HPI. Review of Systems  Objective: Vital Signs: There were no vitals taken for this visit.  Specialty Comments:  No specialty comments available.  PMFS History: Patient Active Problem List   Diagnosis Date Noted   Acute  osteomyelitis of metatarsal bone of left foot (HCC)    Subacute osteomyelitis, left ankle and foot ([place]) 05/13/2022   Non-pressure chronic ulcer of other part of right foot limited to breakdown of skin ([place])    Contracture of right Achilles tendon    Mildly dilatd aortic root (HCC)    Contracture of left Achilles tendon    Metatarsal deformity, left    CAD (coronary artery disease)    Hallux hammertoe, left 12/11/2019   Diarrhea 06/27/2019   Diastasis recti 56/38/9373   Umbilical hernia without obstruction and without gangrene 08/22/2018   Vitamin D deficiency 06/23/2017   Essential hypertension, benign 06/15/2017   Mixed hyperlipidemia 06/15/2017   Class 1 obesity due to excess calories with serious comorbidity and body mass index (BMI) of 31.0 to 31.9 in adult 06/15/2017   Malfunction of implantable cardioverter-defibrillator (ICD) electrode 10/07/2016   Chronic kidney disease (CKD), stage III (moderate) ([place])  04/04/2015   Bladder neck obstruction 03/21/2015   Difficult or painful urination 03/21/2015   Flank pain 03/21/2015   Delayed onset of urination 03/21/2015   Calculus of kidney 03/21/2015   Erosive esophagitis 12/27/2013   Hypotension due to drugs 42/87/6811   Chronic systolic heart failure ([place]) 04/24/2013   CAP (community acquired pneumonia) 03/16/2013   HTN (hypertension) 03/15/2013   Hydrocele 03/15/2013   Spermatocele 03/15/2013   DOE (dyspnea on exertion) 03/15/2013   OSA (obstructive sleep apnea) 01/09/2013   Chronic kidney disease, stage 3 ([place]) 01/03/2013   Chest pain 12/26/2012   History of diagnostic tests 09/06/2012   TIA (transient ischemic attack)    Cardiomyopathy, nonischemic ([place]) 02/24/2010   AICD (automatic cardioverter/defibrillator) 02/24/2010   DM type 2 causing vascular disease ([place]) 06/27/2009   Gout 06/27/2009   Tobacco abuse, in remission 06/27/2009   COLONIC POLYPS 10/31/2008   GERD (gastroesophageal reflux disease) 10/31/2008   Past Medical History:  Diagnosis Date   AICD (automatic cardioverter/defibrillator) present 11/2007   a. 11/2007 SJM Current VR - single lead ICD  - Removed 2018 - "it was burning me"   Anxiety    CAD (coronary artery disease)    non-obstructive CAD by Cor CT in 2020 // Myoview 3/22: EF 57, small inf-sept defect c/w scar, no ischemia; low risk      Chest pain    a. 10/2007 Cath:  normal Cors.   CKD (chronic kidney disease), stage II    DDD (degenerative disc disease), lumbar    Diabetes mellitus DX: 2010   Erosive esophagitis    a. per EGD (08/2011), Dr. Laural Golden - Erosive reflux esophagitis improved but not completely healed since previous EGD 3 years ago. Bx showing  ulcerated gatroesophageal junction mucosa. negative for H. pylori   GERD (gastroesophageal reflux disease)    Gout    Hearing deficit    a. wear bilateral hearing aides   History of hiatal hernia    Hypertension    Mildly dilatd aortic root ([place])    CMR  4/22: EF 52, no LGE; d/w Dr. Marlowe Kays root 38 mm (mildly dilated)   Myocardial infarction [facility]) 2011   Neuropathy    Feet and legs   Nonischemic dilated cardiomyopathy ([place])    a. H/O EF as low as 35-40% by LV gram 10/2007;  b. Echo 02/2011 EF 50-55%, inf HK, Gr 1 DD // CMR 4/22: EF 52, no LGE; d/w Dr. Marlowe Kays root 38 mm (mildly dilated)    Renal insufficiency    Sleep apnea    pt  doesnt use, states"I cant afford one". PCP aware   Stroke ([place])    mini-stroke in 2014   TIA (transient ischemic attack)    July, 2013   Tobacco abuse, in remission 06/27/2009   Discontinued in 2009     Wears dentures    top plate   WPW (Wolff-Parkinson-White syndrome)    a. s/p RFCA @ [place]    Family History  Problem Relation Age of Onset   Heart attack Mother 54   Hypertension Mother    Diabetes Mother    Kidney disease Mother    Breast cancer Mother    Heart attack Father 66   Hypertension Father    Diabetes Father    Stroke Brother    Heart attack Brother 58   Stroke Maternal Grandmother 82   Diabetes Brother    Hypertension Brother     Past Surgical History:  Procedure Laterality Date   AMPUTATION Left 05/19/2022   Procedure: TRANSMETATARSAL AMPUTATION LEFT FOOT;  Surgeon: [name], MD;  Location: [place];  Service: Orthopedics;  Laterality: Left;   [place]     2009   CARDIAC DEFIBRILLATOR PLACEMENT     CHOLECYSTECTOMY     CHOLECYSTECTOMY, LAPAROSCOPIC     11/2007   COLONOSCOPY W/ POLYPECTOMY  2009   ELBOW SURGERY Left 06/2010   ESOPHAGEAL DILATION N/A 11/08/2014   Procedure: ESOPHAGEAL DILATION;  Surgeon: Rogene Houston, MD;  [facility]: [facility];  Service: Endoscopy;  Laterality: N/A;  #56,    ESOPHAGOGASTRODUODENOSCOPY  03/31/2012   also 08/2011; [name]   ESOPHAGOGASTRODUODENOSCOPY (EGD) WITH PROPOFOL N/A 11/08/2014   Procedure: ESOPHAGOGASTRODUODENOSCOPY (EGD) WITH PROPOFOL;  Surgeon: Rogene Houston, MD;   Location[facility];  Service: Endoscopy;  Laterality: N/A;  [place] is 9 , GE Junction is 37   FOOT ARTHRODESIS Left 10/24/2020   Procedure: LEFT GASTROCNEMIUS RECESSION, DORSIFLEXION OSTEOTOMY 1ST MT;  Surgeon: [name], MD;  Location: [place];  Service: Orthopedics;  Laterality: Left;   FOOT ARTHRODESIS Right 01/09/2021   Procedure: CLOSING WEDGE OSTEOTOMY RIGHT 1ST METATARSAL;  Surgeon: [name], MD;  Location: [place];  Service: Orthopedics;  Laterality: Right;   GASTROCNEMIUS RECESSION Right 01/09/2021   Procedure: RIGHT GASTROCNEMIUS RECESSION;  Surgeon: [name], MD;  Location: [place];  Service: Orthopedics;  Laterality: Right;   ICD LEAD REMOVAL N/A 10/07/2016   Procedure: ICD LEAD REMOVAL ;  Surgeon: Evans Lance, MD;  Location: [place];  Service: Cardiovascular;  Laterality: N/A;   [place]; performed at [facility]   TEE WITHOUT CARDIOVERSION N/A 10/07/2016   Procedure: TRANSESOPHAGEAL ECHOCARDIOGRAM (TEE);  Surgeon: Evans Lance, MD;  Location: [place];  Service: Cardiovascular;  Laterality: N/A;   Social History   Occupational History   Occupation: Worked for [employer]: [place]: Laid off in 11/11  Tobacco Use   Smoking status: Former    Packs/day: 1.50    Years: 35.00    Total pack years: 52.50    Types: Cigarettes    Start date: 06/30/1971    Quit date: 07/30/2010    Years since quitting: 11.8   Smokeless tobacco: Former    Types: Chew    Quit date: 03/26/1994  Vaping Use   Vaping Use: Former   Start date: 07/30/2010  Quit date: 06/29/2016  Substance and Sexual Activity   Alcohol use: No    Alcohol/week: 1.0 standard drink of alcohol    Types: 1 Cans of beer per week    Comment: used "years ago"   Drug use: No   Sexual activity: Never    Birth control/protection: None

## 2022-06-17 ENCOUNTER — Ambulatory Visit (INDEPENDENT_AMBULATORY_CARE_PROVIDER_SITE_OTHER): Payer: Medicare Other | Admitting: Orthopedic Surgery

## 2022-06-17 DIAGNOSIS — Z89432 Acquired absence of left foot: Secondary | ICD-10-CM

## 2022-06-28 ENCOUNTER — Encounter: Payer: Self-pay | Admitting: Orthopedic Surgery

## 2022-06-28 NOTE — Progress Notes (Signed)
Office Visit Note   Patient: Mike Floyd           Date of Birth: March 26, 1961           MRN: [id number] Visit Date: 06/17/2022              Requested by: [facility], [address],  [place] [address] PCP: Redmond School, MD  Chief Complaint  Patient presents with   Left Foot - Routine Post Op    05/19/2022 left foot transmet amputation      HPI: Patient is 4 weeks status post left transmetatarsal amputation he is nonweightbearing.  Assessment & Plan: Visit Diagnoses:  1. History of transmetatarsal amputation of left foot ([place])     Plan: Continue with dry dressing changes patient was provided a stump shrinker.  Minimize weightbearing.  Follow-Up Instructions: Return in about 2 weeks (around 07/01/2022).   Ortho Exam  Patient is alert, oriented, no adenopathy, well-dressed, normal affect, normal respiratory effort. Examination patient does have dehiscence of the wound the sutures are in place there is improved granulation tissue.  The wound is gaped open 1 to 2 cm the length of the incision.  There is no ascending cellulitis no drainage no exposed bone or tendons.  Imaging: No results found.   Labs: Lab Results  Component Value Date   HGBA1C 9.7 (H) 05/13/2022   HGBA1C 7.0 (H) 06/15/2017   HGBA1C 5.2 03/15/2013   CRP 0.6 05/16/2022   CRP 2.2 06/27/2019   LABURIC 5.5 03/07/2012   REPTSTATUS 05/18/2022 FINAL 05/13/2022   CULT  05/13/2022    NO GROWTH 5 DAYS Performed at [facility], [address] [place], [place] [address]      Lab Results  Component Value Date   ALBUMIN 4.2 05/13/2022   ALBUMIN 4.6 12/27/2013   ALBUMIN 4.4 05/30/2013    Lab Results  Component Value Date   MG 1.7 05/20/2022   MG 2.0 05/19/2022   MG 1.9 05/18/2022   Lab Results  Component Value Date   VD25OH 18 (L) 06/15/2017    No results found for: "PREALBUMIN"    Latest Ref Rng & Units 05/20/2022    5:45 AM 05/19/2022    5:11 AM 05/18/2022    2:37  AM  CBC EXTENDED  WBC 4.0 - 10.5 K/uL 6.1  6.7  7.5   RBC 4.22 - 5.81 MIL/uL 4.62  5.34  5.15   Hemoglobin 13.0 - 17.0 g/dL 13.6  15.3  15.0   HCT 39.0 - 52.0 % 38.7  45.3  42.2   Platelets 150 - 400 K/uL 155  178  186   NEUT# 1.7 - 7.7 K/uL 4.3  4.6  4.9   Lymph# 0.7 - 4.0 K/uL 1.0  1.2  1.6      There is no height or weight on file to calculate BMI.  Orders:  No orders of the defined types were placed in this encounter.  No orders of the defined types were placed in this encounter.    Procedures: No procedures performed  Clinical Data: No additional findings.  ROS:  All other systems negative, except as noted in the HPI. Review of Systems  Objective: Vital Signs: There were no vitals taken for this visit.  Specialty Comments:  No specialty comments available.  PMFS History: Patient Active Problem List   Diagnosis Date Noted   Acute osteomyelitis of metatarsal bone of left foot (HCC)    Subacute osteomyelitis, left ankle and foot (  [place]) 05/13/2022   Non-pressure chronic ulcer of other part of right foot limited to breakdown of skin ([place])    Contracture of right Achilles tendon    Mildly dilatd aortic root ([place])    Contracture of left Achilles tendon    Metatarsal deformity, left    CAD (coronary artery disease)    Hallux hammertoe, left 12/11/2019   Diarrhea 06/27/2019   Diastasis recti 53/61/4431   Umbilical hernia without obstruction and without gangrene 08/22/2018   Vitamin D deficiency 06/23/2017   Essential hypertension, benign 06/15/2017   Mixed hyperlipidemia 06/15/2017   Class 1 obesity due to excess calories with serious comorbidity and body mass index (BMI) of 31.0 to 31.9 in adult 06/15/2017   Malfunction of implantable cardioverter-defibrillator (ICD) electrode 10/07/2016   Chronic kidney disease (CKD), stage III (moderate) ([place]) 04/04/2015   Bladder neck obstruction 03/21/2015   Difficult or painful urination 03/21/2015   Flank pain 03/21/2015    Delayed onset of urination 03/21/2015   Calculus of kidney 03/21/2015   Erosive esophagitis 12/27/2013   Hypotension due to drugs 54/00/8676   Chronic systolic heart failure ([place]) 04/24/2013   CAP (community acquired pneumonia) 03/16/2013   HTN (hypertension) 03/15/2013   Hydrocele 03/15/2013   Spermatocele 03/15/2013   DOE (dyspnea on exertion) 03/15/2013   OSA (obstructive sleep apnea) 01/09/2013   Chronic kidney disease, stage 3 ([place]) 01/03/2013   Chest pain 12/26/2012   History of diagnostic tests 09/06/2012   TIA (transient ischemic attack)    Cardiomyopathy, nonischemic ([place]) 02/24/2010   AICD (automatic cardioverter/defibrillator) 02/24/2010   DM type 2 causing vascular disease ([place]) 06/27/2009   Gout 06/27/2009   Tobacco abuse, in remission 06/27/2009   COLONIC POLYPS 10/31/2008   GERD (gastroesophageal reflux disease) 10/31/2008   Past Medical History:  Diagnosis Date   AICD (automatic cardioverter/defibrillator) present 11/2007   a. 11/2007 SJM Current VR - single lead ICD  - Removed 2018 - "it was burning me"   Anxiety    CAD (coronary artery disease)    non-obstructive CAD by Cor CT in 2020 // Myoview 3/22: EF 57, small inf-sept defect c/w scar, no ischemia; low risk      Chest pain    a. 10/2007 Cath:  normal Cors.   CKD (chronic kidney disease), stage II    DDD (degenerative disc disease), lumbar    Diabetes mellitus DX: 2010   Erosive esophagitis    a. per EGD (08/2011), Dr. Laural Golden - Erosive reflux esophagitis improved but not completely healed since previous EGD 3 years ago. Bx showing  ulcerated gatroesophageal junction mucosa. negative for H. pylori   GERD (gastroesophageal reflux disease)    Gout    Hearing deficit    a. wear bilateral hearing aides   History of hiatal hernia    Hypertension    Mildly dilatd aortic root ([place])    CMR 4/22: EF 52, no LGE; d/w Dr. Marlowe Kays root 38 mm (mildly dilated)   Myocardial infarction [facility]) 2011   Neuropathy     Feet and legs   Nonischemic dilated cardiomyopathy ([place])    a. H/O EF as low as 35-40% by LV gram 10/2007;  b. Echo 02/2011 EF 50-55%, inf HK, Gr 1 DD // CMR 4/22: EF 52, no LGE; d/w Dr. Marlowe Kays root 38 mm (mildly dilated)    Renal insufficiency    Sleep apnea    pt doesnt use, states"I cant afford one". PCP aware   Stroke ([place])    mini-stroke in  2014   TIA (transient ischemic attack)    July, 2013   Tobacco abuse, in remission 06/27/2009   Discontinued in 2009     Wears dentures    top plate   WPW (Wolff-Parkinson-White syndrome)    a. s/p RFCA @ [place]    Family History  Problem Relation Age of Onset   Heart attack Mother 6   Hypertension Mother    Diabetes Mother    Kidney disease Mother    Breast cancer Mother    Heart attack Father 34   Hypertension Father    Diabetes Father    Stroke Brother    Heart attack Brother 37   Stroke Maternal Grandmother 33   Diabetes Brother    Hypertension Brother     Past Surgical History:  Procedure Laterality Date   AMPUTATION Left 05/19/2022   Procedure: TRANSMETATARSAL AMPUTATION LEFT FOOT;  Surgeon: [name], MD;  Location: [place];  Service: Orthopedics;  Laterality: Left;   [place]     2009   CARDIAC DEFIBRILLATOR PLACEMENT     CHOLECYSTECTOMY     CHOLECYSTECTOMY, LAPAROSCOPIC     11/2007   COLONOSCOPY W/ POLYPECTOMY  2009   ELBOW SURGERY Left 06/2010   ESOPHAGEAL DILATION N/A 11/08/2014   Procedure: ESOPHAGEAL DILATION;  Surgeon: Rogene Houston, MD;  [facility];  Service: Endoscopy;  Laterality: N/A;  #56,    ESOPHAGOGASTRODUODENOSCOPY  03/31/2012   also 08/2011; [name]   ESOPHAGOGASTRODUODENOSCOPY (EGD) WITH PROPOFOL N/A 11/08/2014   Procedure: ESOPHAGOGASTRODUODENOSCOPY (EGD) WITH PROPOFOL;  Surgeon: Rogene Houston, MD;  [facility];  Service: Endoscopy;  Laterality: N/A;  Hi[place] is 4 , GE Junction is 37   FOOT ARTHRODESIS Left 10/24/2020    Procedure: LEFT GASTROCNEMIUS RECESSION, DORSIFLEXION OSTEOTOMY 1ST MT;  Surgeon: [name], MD;  Location: [place];  Service: Orthopedics;  Laterality: Left;   FOOT ARTHRODESIS Right 01/09/2021   Procedure: CLOSING WEDGE OSTEOTOMY RIGHT 1ST METATARSAL;  Surgeon: [name], MD;  Location: [place];  Service: Orthopedics;  Laterality: Right;   GASTROCNEMIUS RECESSION Right 01/09/2021   Procedure: RIGHT GASTROCNEMIUS RECESSION;  Surgeon: [name], MD;  Location: [place];  Service: Orthopedics;  Laterality: Right;   ICD LEAD REMOVAL N/A 10/07/2016   Procedure: ICD LEAD REMOVAL ;  Surgeon: Evans Lance, MD;  Location: [place];  Service: Cardiovascular;  Laterality: N/A;   [place]; performed at [facility]   TEE WITHOUT CARDIOVERSION N/A 10/07/2016   Procedure: TRANSESOPHAGEAL ECHOCARDIOGRAM (TEE);  Surgeon: Evans Lance, MD;  Location: [place];  Service: Cardiovascular;  Laterality: N/A;   Social History   Occupational History   Occupation: Worked for [employer]: [place]: Laid off in 11/11  Tobacco Use   Smoking status: Former    Packs/day: 1.50    Years: 35.00    Total pack years: 52.50    Types: Cigarettes    Start date: 06/30/1971    Quit date: 07/30/2010    Years since quitting: 11.9   Smokeless tobacco: Former    Types: Chew    Quit date: 03/26/1994  Vaping Use   Vaping Use: Former   Start date: 07/30/2010   Quit date: 06/29/2016  Substance and Sexual Activity   Alcohol use: No  Alcohol/week: 1.0 standard drink of alcohol    Types: 1 Cans of beer per week    Comment: used "years ago"   Drug use: No   Sexual activity: Never    Birth control/protection: None

## 2022-06-29 ENCOUNTER — Ambulatory Visit (INDEPENDENT_AMBULATORY_CARE_PROVIDER_SITE_OTHER): Payer: Medicare Other | Admitting: Orthopedic Surgery

## 2022-06-29 DIAGNOSIS — Z89432 Acquired absence of left foot: Secondary | ICD-10-CM

## 2022-07-02 ENCOUNTER — Encounter: Payer: Self-pay | Admitting: Orthopedic Surgery

## 2022-07-02 NOTE — Progress Notes (Signed)
Office Visit Note   Patient: Mike Floyd           Date of Birth: 01-02-1961           MRN: [id number] Visit Date: 06/29/2022              Requested by: Redmond School, [place],  [place] [address] PCP: Redmond School, MD  Chief Complaint  Patient presents with   Left Foot - Routine Post Op    05/19/2022 left foot transmet amputation       HPI: Patient is a 61 year old gentleman who is 6 weeks status post left transmetatarsal amputation he has been nonweightbearing with crutches and postoperative shoe.  Assessment & Plan: Visit Diagnoses:  1. History of transmetatarsal amputation of left foot ([place])     Plan: Will continue with wound care nonweightbearing dry dressing change daily with Dial soap cleansing.  Follow-Up Instructions: Return in about 2 weeks (around 07/13/2022).   Ortho Exam  Patient is alert, oriented, no adenopathy, well-dressed, normal affect, normal respiratory effort. Examination there has been interval decrease swelling and improvement in the wound bed.  The wound dehiscence now measures 3 x 6 cm with no exposed bone or tendon there is improved granulation tissue.  Will request approval for an office Kerecis tissue graft.  Imaging: No results found.   Labs: Lab Results  Component Value Date   HGBA1C 9.7 (H) 05/13/2022   HGBA1C 7.0 (H) 06/15/2017   HGBA1C 5.2 03/15/2013   CRP 0.6 05/16/2022   CRP 2.2 06/27/2019   LABURIC 5.5 03/07/2012   REPTSTATUS 05/18/2022 FINAL 05/13/2022   CULT  05/13/2022    NO GROWTH 5 DAYS Performed at [facility], [address] [place], [place] [address]      Lab Results  Component Value Date   ALBUMIN 4.2 05/13/2022   ALBUMIN 4.6 12/27/2013   ALBUMIN 4.4 05/30/2013    Lab Results  Component Value Date   MG 1.7 05/20/2022   MG 2.0 05/19/2022   MG 1.9 05/18/2022   Lab Results  Component Value Date   VD25OH 18 (L) 06/15/2017    No results found for: "PREALBUMIN"    Latest  Ref Rng & Units 05/20/2022    5:45 AM 05/19/2022    5:11 AM 05/18/2022    2:37 AM  CBC EXTENDED  WBC 4.0 - 10.5 K/uL 6.1  6.7  7.5   RBC 4.22 - 5.81 MIL/uL 4.62  5.34  5.15   Hemoglobin 13.0 - 17.0 g/dL 13.6  15.3  15.0   HCT 39.0 - 52.0 % 38.7  45.3  42.2   Platelets 150 - 400 K/uL 155  178  186   NEUT# 1.7 - 7.7 K/uL 4.3  4.6  4.9   Lymph# 0.7 - 4.0 K/uL 1.0  1.2  1.6      There is no height or weight on file to calculate BMI.  Orders:  No orders of the defined types were placed in this encounter.  No orders of the defined types were placed in this encounter.    Procedures: No procedures performed  Clinical Data: No additional findings.  ROS:  All other systems negative, except as noted in the HPI. Review of Systems  Objective: Vital Signs: There were no vitals taken for this visit.  Specialty Comments:  No specialty comments available.  PMFS History: Patient Active Problem List   Diagnosis Date Noted   Acute osteomyelitis of metatarsal bone of left foot (  [place])    Subacute osteomyelitis, left ankle and foot ([place]) 05/13/2022   Non-pressure chronic ulcer of other part of right foot limited to breakdown of skin ([place])    Contracture of right Achilles tendon    Mildly dilatd aortic root ([facility])    Contracture of left Achilles tendon    Metatarsal deformity, left    CAD (coronary artery disease)    Hallux hammertoe, left 12/11/2019   Diarrhea 06/27/2019   Diastasis recti 16/05/9603   Umbilical hernia without obstruction and without gangrene 08/22/2018   Vitamin D deficiency 06/23/2017   Essential hypertension, benign 06/15/2017   Mixed hyperlipidemia 06/15/2017   Class 1 obesity due to excess calories with serious comorbidity and body mass index (BMI) of 31.0 to 31.9 in adult 06/15/2017   Malfunction of implantable cardioverter-defibrillator (ICD) electrode 10/07/2016   Chronic kidney disease (CKD), stage III (moderate) ([place]) 04/04/2015   Bladder neck obstruction  03/21/2015   Difficult or painful urination 03/21/2015   Flank pain 03/21/2015   Delayed onset of urination 03/21/2015   Calculus of kidney 03/21/2015   Erosive esophagitis 12/27/2013   Hypotension due to drugs 54/04/8118   Chronic systolic heart failure ([place]) 04/24/2013   CAP (community acquired pneumonia) 03/16/2013   HTN (hypertension) 03/15/2013   Hydrocele 03/15/2013   Spermatocele 03/15/2013   DOE (dyspnea on exertion) 03/15/2013   OSA (obstructive sleep apnea) 01/09/2013   Chronic kidney disease, stage 3 ([place]) 01/03/2013   Chest pain 12/26/2012   History of diagnostic tests 09/06/2012   TIA (transient ischemic attack)    Cardiomyopathy, nonischemic ([place]) 02/24/2010   AICD (automatic cardioverter/defibrillator) 02/24/2010   DM type 2 causing vascular disease ([place]) 06/27/2009   Gout 06/27/2009   Tobacco abuse, in remission 06/27/2009   COLONIC POLYPS 10/31/2008   GERD (gastroesophageal reflux disease) 10/31/2008   Past Medical History:  Diagnosis Date   AICD (automatic cardioverter/defibrillator) present 11/2007   a. 11/2007 SJM Current VR - single lead ICD  - Removed 2018 - "it was burning me"   Anxiety    CAD (coronary artery disease)    non-obstructive CAD by Cor CT in 2020 // Myoview 3/22: EF 57, small inf-sept defect c/w scar, no ischemia; low risk      Chest pain    a. 10/2007 Cath:  normal Cors.   CKD (chronic kidney disease), stage II    DDD (degenerative disc disease), lumbar    Diabetes mellitus DX: 2010   Erosive esophagitis    a. per EGD (08/2011), Dr. Laural Golden - Erosive reflux esophagitis improved but not completely healed since previous EGD 3 years ago. Bx showing  ulcerated gatroesophageal junction mucosa. negative for H. pylori   GERD (gastroesophageal reflux disease)    Gout    Hearing deficit    a. wear bilateral hearing aides   History of hiatal hernia    Hypertension    Mildly dilatd aortic root ([place])    CMR 4/22: EF 52, no LGE; d/w Dr. Marlowe Kays  root 38 mm (mildly dilated)   Myocardial infarction [facility]) 2011   Neuropathy    Feet and legs   Nonischemic dilated cardiomyopathy ([place])    a. H/O EF as low as 35-40% by LV gram 10/2007;  b. Echo 02/2011 EF 50-55%, inf HK, Gr 1 DD // CMR 4/22: EF 52, no LGE; d/w Dr. Marlowe Kays root 38 mm (mildly dilated)    Renal insufficiency    Sleep apnea    pt doesnt use, states"I cant afford one". PCP  aware   Stroke ([place])    mini-stroke in 2014   TIA (transient ischemic attack)    July, 2013   Tobacco abuse, in remission 06/27/2009   Discontinued in 2009     Wears dentures    top plate   WPW (Wolff-Parkinson-White syndrome)    a. s/p RFCA @ [place]    Family History  Problem Relation Age of Onset   Heart attack Mother 76   Hypertension Mother    Diabetes Mother    Kidney disease Mother    Breast cancer Mother    Heart attack Father 70   Hypertension Father    Diabetes Father    Stroke Brother    Heart attack Brother 46   Stroke Maternal Grandmother 49   Diabetes Brother    Hypertension Brother     Past Surgical History:  Procedure Laterality Date   AMPUTATION Left 05/19/2022   Procedure: TRANSMETATARSAL AMPUTATION LEFT FOOT;  Surgeon: [name], MD;  Location: [facility];  Service: Orthopedics;  Laterality: Left;   [place]     2009   CARDIAC DEFIBRILLATOR PLACEMENT     CHOLECYSTECTOMY     CHOLECYSTECTOMY, LAPAROSCOPIC     11/2007   COLONOSCOPY W/ POLYPECTOMY  2009   ELBOW SURGERY Left 06/2010   ESOPHAGEAL DILATION N/A 11/08/2014   Procedure: ESOPHAGEAL DILATION;  Surgeon: Rogene Houston, MD;  Location: [facility];  Service: Endoscopy;  Laterality: N/A;  #56,    ESOPHAGOGASTRODUODENOSCOPY  03/31/2012   also 08/2011; [name]   ESOPHAGOGASTRODUODENOSCOPY (EGD) WITH PROPOFOL N/A 11/08/2014   Procedure: ESOPHAGOGASTRODUODENOSCOPY (EGD) WITH PROPOFOL;  Surgeon: Rogene Houston, MD;  Location: [facility];  Service: Endoscopy;   Laterality: N/A;  [place] is 60 , GE Junction is 37   FOOT ARTHRODESIS Left 10/24/2020   Procedure: LEFT GASTROCNEMIUS RECESSION, DORSIFLEXION OSTEOTOMY 1ST MT;  Surgeon: [name], MD;  Location: [facility];  Service: Orthopedics;  Laterality: Left;   FOOT ARTHRODESIS Right 01/09/2021   Procedure: CLOSING WEDGE OSTEOTOMY RIGHT 1ST METATARSAL;  Surgeon: [name], MD;  Location: [place] [facility];  Service: Orthopedics;  Laterality: Right;   GASTROCNEMIUS RECESSION Right 01/09/2021   Procedure: RIGHT GASTROCNEMIUS RECESSION;  Surgeon: [name], MD;  Location: [facility];  Service: Orthopedics;  Laterality: Right;   ICD LEAD REMOVAL N/A 10/07/2016   Procedure: ICD LEAD REMOVAL ;  Surgeon: Evans Lance, MD;  Location: [facility];  Service: Cardiovascular;  Laterality: N/A;   [place]; performed at [facility]   TEE WITHOUT CARDIOVERSION N/A 10/07/2016   Procedure: TRANSESOPHAGEAL ECHOCARDIOGRAM (TEE);  Surgeon: Evans Lance, MD;  Location: [facility];  Service: Cardiovascular;  Laterality: N/A;   Social History   Occupational History   Occupation: Worked for [employer]: [place]: Laid off in 11/11  Tobacco Use   Smoking status: Former    Packs/day: 1.50    Years: 35.00    Total pack years: 52.50    Types: Cigarettes    Start date: 06/30/1971    Quit date: 07/30/2010    Years since quitting: 11.9   Smokeless tobacco: Former    Types: Chew    Quit date: 03/26/1994  Vaping Use   Vaping Use: Former   Start date: 07/30/2010   Quit date: 06/29/2016  Substance  and Sexual Activity   Alcohol use: No    Alcohol/week: 1.0 standard drink of alcohol    Types: 1 Cans of beer per week    Comment: used "years ago"   Drug use: No   Sexual activity: Never    Birth control/protection: None

## 2022-07-09 DIAGNOSIS — E08621 Diabetes mellitus due to underlying condition with foot ulcer: Secondary | ICD-10-CM | POA: Diagnosis not present

## 2022-07-09 DIAGNOSIS — L97509 Non-pressure chronic ulcer of other part of unspecified foot with unspecified severity: Secondary | ICD-10-CM | POA: Diagnosis not present

## 2022-07-09 DIAGNOSIS — E1140 Type 2 diabetes mellitus with diabetic neuropathy, unspecified: Secondary | ICD-10-CM | POA: Diagnosis not present

## 2022-07-09 DIAGNOSIS — G894 Chronic pain syndrome: Secondary | ICD-10-CM | POA: Diagnosis not present

## 2022-07-09 DIAGNOSIS — M5136 Other intervertebral disc degeneration, lumbar region: Secondary | ICD-10-CM | POA: Diagnosis not present

## 2022-07-09 DIAGNOSIS — N183 Chronic kidney disease, stage 3 unspecified: Secondary | ICD-10-CM | POA: Diagnosis not present

## 2022-07-09 DIAGNOSIS — M1991 Primary osteoarthritis, unspecified site: Secondary | ICD-10-CM | POA: Diagnosis not present

## 2022-07-13 ENCOUNTER — Encounter: Payer: Self-pay | Admitting: Orthopedic Surgery

## 2022-07-13 ENCOUNTER — Ambulatory Visit (INDEPENDENT_AMBULATORY_CARE_PROVIDER_SITE_OTHER): Payer: Medicare Other | Admitting: Orthopedic Surgery

## 2022-07-13 DIAGNOSIS — Z89432 Acquired absence of left foot: Secondary | ICD-10-CM

## 2022-07-13 NOTE — Progress Notes (Signed)
Office Visit Note   Patient: Mike Floyd           Date of Birth: [birth year]           MRN: [id number] Visit Date: 07/13/2022              Requested by: Redmond School, MD [address] [place],  [place] [address] PCP: Redmond School, MD  Chief Complaint  Patient presents with   Left Foot - Routine Post Op    05/19/2022 left foot transmet amputation       HPI: Patient is a 61 year old gentleman who is 11 weeks status post left transmetatarsal amputation with wound dehiscence.  Patient states his glucose this morning was 548.  Assessment & Plan: Visit Diagnoses:  1. History of transmetatarsal amputation of left foot ([place])     Plan: Patient will work on glucose control Dial soap cleansing dry dressing change Ace wrap postoperative shoe and minimize weightbearing.  Follow-Up Instructions: Return in about 4 weeks (around 08/10/2022).   Ortho Exam  Patient is alert, oriented, no adenopathy, well-dressed, normal affect, normal respiratory effort. Examination the wound bed after gentle debridement has healthy granulation tissue.  The wound measures 3 x 7 cm there is no depth or undermining to the wound.  Imaging: No results found.   Labs: Lab Results  Component Value Date   HGBA1C 9.7 (H) 05/13/2022   HGBA1C 7.0 (H) 06/15/2017   HGBA1C 5.2 03/15/2013   CRP 0.6 05/16/2022   CRP 2.2 06/27/2019   LABURIC 5.5 03/07/2012   REPTSTATUS 05/18/2022 FINAL 05/13/2022   CULT  05/13/2022    NO GROWTH 5 DAYS Performed at [facility], [address], [place], [place] [address]      Lab Results  Component Value Date   ALBUMIN 4.2 05/13/2022   ALBUMIN 4.6 12/27/2013   ALBUMIN 4.4 05/30/2013    Lab Results  Component Value Date   MG 1.7 05/20/2022   MG 2.0 05/19/2022   MG 1.9 05/18/2022   Lab Results  Component Value Date   VD25OH 18 (L) 06/15/2017    No results found for: "PREALBUMIN"    Latest Ref Rng & Units 05/20/2022    5:45 AM 05/19/2022    5:11  AM 05/18/2022    2:37 AM  CBC EXTENDED  WBC 4.0 - 10.5 K/uL 6.1  6.7  7.5   RBC 4.22 - 5.81 MIL/uL 4.62  5.34  5.15   Hemoglobin 13.0 - 17.0 g/dL 13.6  15.3  15.0   HCT 39.0 - 52.0 % 38.7  45.3  42.2   Platelets 150 - 400 K/uL 155  178  186   NEUT# 1.7 - 7.7 K/uL 4.3  4.6  4.9   Lymph# 0.7 - 4.0 K/uL 1.0  1.2  1.6      There is no height or weight on file to calculate BMI.  Orders:  No orders of the defined types were placed in this encounter.  No orders of the defined types were placed in this encounter.    Procedures: No procedures performed  Clinical Data: No additional findings.  ROS:  All other systems negative, except as noted in the HPI. Review of Systems  Objective: Vital Signs: There were no vitals taken for this visit.  Specialty Comments:  No specialty comments available.  PMFS History: Patient Active Problem List   Diagnosis Date Noted   Acute osteomyelitis of metatarsal bone of left foot (H[facility])    Subacute osteomyelitis, left ankle  and foot ([place]) 05/13/2022   Non-pressure chronic ulcer of other part of right foot limited to breakdown of skin ([place])    Contracture of right Achilles tendon    Mildly dilatd aortic root ([place])    Contracture of left Achilles tendon    Metatarsal deformity, left    CAD (coronary artery disease)    Hallux hammertoe, left 12/11/2019   Diarrhea 06/27/2019   Diastasis recti 65/99/3570   Umbilical hernia without obstruction and without gangrene 08/22/2018   Vitamin D deficiency 06/23/2017   Essential hypertension, benign 06/15/2017   Mixed hyperlipidemia 06/15/2017   Class 1 obesity due to excess calories with serious comorbidity and body mass index (BMI) of 31.0 to 31.9 in adult 06/15/2017   Malfunction of implantable cardioverter-defibrillator (ICD) electrode 10/07/2016   Chronic kidney disease (CKD), stage III (moderate) ([place]) 04/04/2015   Bladder neck obstruction 03/21/2015   Difficult or painful urination 03/21/2015    Flank pain 03/21/2015   Delayed onset of urination 03/21/2015   Calculus of kidney 03/21/2015   Erosive esophagitis 12/27/2013   Hypotension due to drugs 17/79/3903   Chronic systolic heart failure ([place]) 04/24/2013   CAP (community acquired pneumonia) 03/16/2013   HTN (hypertension) 03/15/2013   Hydrocele 03/15/2013   Spermatocele 03/15/2013   DOE (dyspnea on exertion) 03/15/2013   OSA (obstructive sleep apnea) 01/09/2013   Chronic kidney disease, stage 3 ([place]) 01/03/2013   Chest pain 12/26/2012   History of diagnostic tests 09/06/2012   TIA (transient ischemic attack)    Cardiomyopathy, nonischemic ([place]) 02/24/2010   AICD (automatic cardioverter/defibrillator) 02/24/2010   DM type 2 causing vascular disease ([place]) 06/27/2009   Gout 06/27/2009   Tobacco abuse, in remission 06/27/2009   COLONIC POLYPS 10/31/2008   GERD (gastroesophageal reflux disease) 10/31/2008   Past Medical History:  Diagnosis Date   AICD (automatic cardioverter/defibrillator) present 11/2007   a. 11/2007 SJM Current VR - single lead ICD  - Removed 2018 - "it was burning me"   Anxiety    CAD (coronary artery disease)    non-obstructive CAD by Cor CT in 2020 // Myoview 3/22: EF 57, small inf-sept defect c/w scar, no ischemia; low risk      Chest pain    a. 10/2007 Cath:  normal Cors.   CKD (chronic kidney disease), stage II    DDD (degenerative disc disease), lumbar    Diabetes mellitus DX: 2010   Erosive esophagitis    a. per EGD (08/2011), Dr. Laural Golden - Erosive reflux esophagitis improved but not completely healed since previous EGD 3 years ago. Bx showing  ulcerated gatroesophageal junction mucosa. negative for H. pylori   GERD (gastroesophageal reflux disease)    Gout    Hearing deficit    a. wear bilateral hearing aides   History of hiatal hernia    Hypertension    Mildly dilatd aortic root ([place])    CMR 4/22: EF 52, no LGE; d/w Dr. Marlowe Kays root 38 mm (mildly dilated)   Myocardial infarction [facility])  2011   Neuropathy    Feet and legs   Nonischemic dilated cardiomyopathy ([place])    a. H/O EF as low as 35-40% by LV gram 10/2007;  b. Echo 02/2011 EF 50-55%, inf HK, Gr 1 DD // CMR 4/22: EF 52, no LGE; d/w Dr. Marlowe Kays root 38 mm (mildly dilated)    Renal insufficiency    Sleep apnea    pt doesnt use, states"I cant afford one". PCP aware   Stroke ([place])  mini-stroke in 2014   TIA (transient ischemic attack)    July, 2013   Tobacco abuse, in remission 06/27/2009   Discontinued in 2009     Wears dentures    top plate   WPW (Wolff-Parkinson-White syndrome)    a. s/p RFCA @ [facility]    Family History  Problem Relation Age of Onset   Heart attack Mother 61   Hypertension Mother    Diabetes Mother    Kidney disease Mother    Breast cancer Mother    Heart attack Father 72   Hypertension Father    Diabetes Father    Stroke Brother    Heart attack Brother 31   Stroke Maternal Grandmother 70   Diabetes Brother    Hypertension Brother     Past Surgical History:  Procedure Laterality Date   AMPUTATION Left 05/19/2022   Procedure: TRANSMETATARSAL AMPUTATION LEFT FOOT;  Surgeon: [name], MD;  Location: [facility];  Service: Orthopedics;  Laterality: Left;   [place]     2009   CARDIAC DEFIBRILLATOR PLACEMENT     CHOLECYSTECTOMY     CHOLECYSTECTOMY, LAPAROSCOPIC     11/2007   COLONOSCOPY W/ POLYPECTOMY  2009   ELBOW SURGERY Left 06/2010   ESOPHAGEAL DILATION N/A 11/08/2014   Procedure: ESOPHAGEAL DILATION;  Surgeon: Rogene Houston, MD;  Location[facility];  Service: Endoscopy;  Laterality: N/A;  #56,    ESOPHAGOGASTRODUODENOSCOPY  03/31/2012   also 08/2011; [name]   ESOPHAGOGASTRODUODENOSCOPY (EGD) WITH PROPOFOL N/A 11/08/2014   Procedure: ESOPHAGOGASTRODUODENOSCOPY (EGD) WITH PROPOFOL;  Surgeon: Rogene Houston, MD;  Location: [facility];  Service: Endoscopy;  Laterality: N/A;  [place] is 78 , GE [place]unction is 37   FOOT  ARTHRODESIS Left 10/24/2020   Procedure: LEFT GASTROCNEMIUS RECESSION, DORSIFLEXION OSTEOTOMY 1ST MT;  Surgeon: [name], MD;  Location: [facility];  Service: Orthopedics;  Laterality: Left;   FOOT ARTHRODESIS Right 01/09/2021   Procedure: CLOSING WEDGE OSTEOTOMY RIGHT 1ST METATARSAL;  Surgeon: [name], MD;  Location: [facility];  Service: Orthopedics;  Laterality: Right;   GASTROCNEMIUS RECESSION Right 01/09/2021   Procedure: RIGHT GASTROCNEMIUS RECESSION;  Surgeon: [name], MD;  Location: [facility];  Service: Orthopedics;  Laterality: Right;   ICD LEAD REMOVAL N/A 10/07/2016   Procedure: ICD LEAD REMOVAL ;  Surgeon: Evans Lance, MD;  Location: [facility] [place];  Service: Cardiovascular;  Laterality: N/A;   [place]; [facility] at [facility]   TEE WITHOUT CARDIOVERSION N/A 10/07/2016   Procedure: TRANSESOPHAGEAL ECHOCARDIOGRAM (TEE);  Surgeon: Evans Lance, MD;  Location: [facility];  Service: Cardiovascular;  Laterality: N/A;   Social History   Occupational History   Occupation: Worked for [employer]: [place]: Laid off in 11/11  Tobacco Use   Smoking status: Former    Packs/day: 1.50    Years: 35.00    Total pack years: 52.50    Types: Cigarettes    Start date: 06/30/1971    Quit date: 07/30/2010    Years since quitting: 11.9   Smokeless tobacco: Former    Types: Chew    Quit date: 03/26/1994  Vaping Use   Vaping Use: Former   Start date: 07/30/2010   Quit date: 06/29/2016  Substance and Sexual Activity   Alcohol use: No  Alcohol/week: 1.0 standard drink of alcohol    Types: 1 Cans of beer per week    Comment: used "years ago"   Drug use: No   Sexual activity: Never    Birth control/protection: None

## 2022-08-10 ENCOUNTER — Ambulatory Visit (INDEPENDENT_AMBULATORY_CARE_PROVIDER_SITE_OTHER): Payer: Medicare Other | Admitting: Orthopedic Surgery

## 2022-08-10 DIAGNOSIS — Z89432 Acquired absence of left foot: Secondary | ICD-10-CM

## 2022-08-11 ENCOUNTER — Encounter: Payer: Self-pay | Admitting: Orthopedic Surgery

## 2022-08-11 NOTE — Progress Notes (Signed)
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  He states he is working on glucose control.  Assessment & Plan: Visit Diagnoses:  1. History of transmetatarsal amputation of left foot ([place])     Plan: The heel callus.  The hypertrophic granulation tissue was touched with silver nitrate.  Continue routine wound care.  Follow-Up Instructions: Return in about 4 weeks (around 09/07/2022).   Ortho Exam  Patient is alert, oriented, no adenopathy, well-dressed, normal affect, normal respiratory effort. Examination patient has some callus on the heel that was pared no evidence of deep ulcer.  He has hypertrophic granulation tissue that was touched with silver nitrate.  The wound measures 3 x 5 cm.  Imaging: No results found.   Labs: Lab Results  Component Value Date   HGBA1C 9.7 (H) 05/13/2022   HGBA1C 7.0 (H) 06/15/2017   HGBA1C 5.2 03/15/2013   CRP 0.6 05/16/2022   CRP 2.2 06/27/2019   LABURIC 5.5 03/07/2012   REPTSTATUS 05/18/2022 FINAL 05/13/2022   CULT  05/13/2022    NO GROWTH 5 DAYS Performed at [facility], [address] [place], [place] [address]      Lab Results  Component Value Date   ALBUMIN 4.2 05/13/2022   ALBUMIN 4.6 12/27/2013   ALBUMIN 4.4 05/30/2013    Lab Results  Component Value Date   MG 1.7 05/20/2022   MG 2.0 05/19/2022   MG 1.9 05/18/2022   Lab Results  Component Value Date   VD25OH 18 (L) 06/15/2017    No results  found for: "PREALBUMIN"    Latest Ref Rng & Units 05/20/2022    5:45 AM 05/19/2022    5:11 AM 05/18/2022    2:37 AM  CBC EXTENDED  WBC 4.0 - 10.5 K/uL 6.1  6.7  7.5   RBC 4.22 - 5.81 MIL/uL 4.62  5.34  5.15   Hemoglobin 13.0 - 17.0 g/dL 13.6  15.3  15.0   HCT 39.0 - 52.0 % 38.7  45.3  42.2   Platelets 150 - 400 K/uL 155  178  186   NEUT# 1.7 - 7.7 K/uL 4.3  4.6  4.9   Lymph# 0.7 - 4.0 K/uL 1.0  1.2  1.6      There is no height or weight on file to calculate BMI.  Orders:  No orders of the defined types were placed in this encounter.  No orders of the defined types were placed in this encounter.    Procedures: No procedures performed  Clinical Data: No additional findings.  ROS:  All other systems negative, except as noted in the HPI. Review of Systems  Objective: Vital Signs: There were no vitals taken for this visit.  Specialty Comments:  No specialty comments available.  PMFS History: Patient Active Problem List   Diagnosis Date Noted  Acute osteomyelitis of metatarsal bone of left foot (HCC)    Subacute osteomyelitis, left ankle and foot ([place]) 05/13/2022   Non-pressure chronic ulcer of other part of right foot limited to breakdown of skin ([place])    Contracture of right Achilles tendon    Mildly dilatd aortic root (HCC)    Contracture of left Achilles tendon    Metatarsal deformity, left    CAD (coronary artery disease)    Hallux hammertoe, left 12/11/2019   Diarrhea 06/27/2019   Diastasis recti 83/38/2505   Umbilical hernia without obstruction and without gangrene 08/22/2018   Vitamin D deficiency 06/23/2017   Essential hypertension, benign 06/15/2017   Mixed hyperlipidemia 06/15/2017   Class 1 obesity due to excess calories with serious comorbidity and body mass index (BMI) of 31.0 to 31.9 in adult 06/15/2017   Malfunction of implantable cardioverter-defibrillator (ICD) electrode 10/07/2016   Chronic kidney disease (CKD), stage III (moderate) ([place])  04/04/2015   Bladder neck obstruction 03/21/2015   Difficult or painful urination 03/21/2015   Flank pain 03/21/2015   Delayed onset of urination 03/21/2015   Calculus of kidney 03/21/2015   Erosive esophagitis 12/27/2013   Hypotension due to drugs 39/76/7341   Chronic systolic heart failure ([place]) 04/24/2013   CAP (community acquired pneumonia) 03/16/2013   HTN (hypertension) 03/15/2013   Hydrocele 03/15/2013   Spermatocele 03/15/2013   DOE (dyspnea on exertion) 03/15/2013   OSA (obstructive sleep apnea) 01/09/2013   Chronic kidney disease, stage 3 ([place]) 01/03/2013   Chest pain 12/26/2012   History of diagnostic tests 09/06/2012   TIA (transient ischemic attack)    Cardiomyopathy, nonischemic ([place]) 02/24/2010   AICD (automatic cardioverter/defibrillator) 02/24/2010   DM type 2 causing vascular disease ([place]) 06/27/2009   Gout 06/27/2009   Tobacco abuse, in remission 06/27/2009   COLONIC POLYPS 10/31/2008   GERD (gastroesophageal reflux disease) 10/31/2008   Past Medical History:  Diagnosis Date   AICD (automatic cardioverter/defibrillator) present 11/2007   a. 11/2007 SJM Current VR - single lead ICD  - Removed 2018 - "it was burning me"   Anxiety    CAD (coronary artery disease)    non-obstructive CAD by Cor CT in 2020 // Myoview 3/22: EF 57, small inf-sept defect c/w scar, no ischemia; low risk      Chest pain    a. 10/2007 Cath:  normal Cors.   CKD (chronic kidney disease), stage II    DDD (degenerative disc disease), lumbar    Diabetes mellitus DX: 2010   Erosive esophagitis    a. per EGD (08/2011), Dr. Laural Golden - Erosive reflux esophagitis improved but not completely healed since previous EGD 3 years ago. Bx showing  ulcerated gatroesophageal junction mucosa. negative for H. pylori   GERD (gastroesophageal reflux disease)    Gout    Hearing deficit    a. wear bilateral hearing aides   History of hiatal hernia    Hypertension    Mildly dilatd aortic root ([place])    CMR  4/22: EF 52, no LGE; d/w Dr. Marlowe Kays root 38 mm (mildly dilated)   Myocardial infarction [facility]) 2011   Neuropathy    Feet and legs   Nonischemic dilated cardiomyopathy ([place])    a. H/O EF as low as 35-40% by LV gram 10/2007;  b. Echo 02/2011 EF 50-55%, inf HK, Gr 1 DD // CMR 4/22: EF 52, no LGE; d/w Dr. Marlowe Kays root 38 mm (mildly dilated)    Renal insufficiency    Sleep apnea  pt doesnt use, states"I cant afford one". PCP aware   Stroke ([place])    mini-stroke in 2014   TIA (transient ischemic attack)    July, 2013   Tobacco abuse, in remission 06/27/2009   Discontinued in 2009     Wears dentures    top plate   WPW (Wolff-Parkinson-White syndrome)    a. s/p RFCA @ [place]    Family History  Problem Relation Age of Onset   Heart attack Mother 89   Hypertension Mother    Diabetes Mother    Kidney disease Mother    Breast cancer Mother    Heart attack Father 69   Hypertension Father    Diabetes Father    Stroke Brother    Heart attack Brother 22   Stroke Maternal Grandmother 56   Diabetes Brother    Hypertension Brother     Past Surgical History:  Procedure Laterality Date   AMPUTATION Left 05/19/2022   Procedure: TRANSMETATARSAL AMPUTATION LEFT FOOT;  Surgeon: [name], MD;  Location: [place];  Service: Orthopedics;  Laterality: Left;   [place]     2009   CARDIAC DEFIBRILLATOR PLACEMENT     CHOLECYSTECTOMY     CHOLECYSTECTOMY, LAPAROSCOPIC     11/2007   COLONOSCOPY W/ POLYPECTOMY  2009   ELBOW SURGERY Left 06/2010   ESOPHAGEAL DILATION N/A 11/08/2014   Procedure: ESOPHAGEAL DILATION;  Surgeon: Rogene Houston, MD;  [facility]: [facility];  Service: Endoscopy;  Laterality: N/A;  #56,    ESOPHAGOGASTRODUODENOSCOPY  03/31/2012   also 08/2011; [name]   ESOPHAGOGASTRODUODENOSCOPY (EGD) WITH PROPOFOL N/A 11/08/2014   Procedure: ESOPHAGOGASTRODUODENOSCOPY (EGD) WITH PROPOFOL;  Surgeon: Rogene Houston, MD;   Location: [facility];  Service: Endoscopy;  Laterality: N/A;  [place] is 56 , [place] is 37   FOOT ARTHRODESIS Left 10/24/2020   Procedure: LEFT GASTROCNEMIUS RECESSION, DORSIFLEXION OSTEOTOMY 1ST MT;  Surgeon: [name], MD;  Location: [place];  Service: Orthopedics;  Laterality: Left;   FOOT ARTHRODESIS Right 01/09/2021   Procedure: CLOSING WEDGE OSTEOTOMY RIGHT 1ST METATARSAL;  Surgeon: [name], MD;  Location: [place];  Service: Orthopedics;  Laterality: Right;   GASTROCNEMIUS RECESSION Right 01/09/2021   Procedure: RIGHT GASTROCNEMIUS RECESSION;  Surgeon: [name], MD;  Location: [place];  Service: Orthopedics;  Laterality: Right;   ICD LEAD REMOVAL N/A 10/07/2016   Procedure: ICD LEAD REMOVAL ;  Surgeon: Evans Lance, MD;  Location: [place];  Service: Cardiovascular;  Laterality: N/A;   [place]; performed at [facility]   TEE WITHOUT CARDIOVERSION N/A 10/07/2016   Procedure: TRANSESOPHAGEAL ECHOCARDIOGRAM (TEE);  Surgeon: Evans Lance, MD;  Location: [place];  Service: Cardiovascular;  Laterality: N/A;   Social History   Occupational History   Occupation: Worked for [employer]: [name]: Laid off in 11/11  Tobacco Use   Smoking status: Former    Packs/day: 1.50    Years: 35.00    Total pack years: 52.50    Types: Cigarettes    Start date: 06/30/1971    Quit date: 07/30/2010    Years since quitting: 12.0   Smokeless tobacco: Former    Types: Sarina Ser    Quit date: 03/26/1994  Vaping Use   Vaping Use: Former   Start date:  07/30/2010   Quit date: 06/29/2016  Substance and Sexual Activity   Alcohol use: No    Alcohol/week: 1.0 standard drink of alcohol    Types: 1 Cans of beer per week    Comment: used "years ago"   Drug use: No   Sexual activity: Never    Birth control/protection: None

## 2022-09-07 ENCOUNTER — Ambulatory Visit (INDEPENDENT_AMBULATORY_CARE_PROVIDER_SITE_OTHER): Payer: Medicare Other | Admitting: Orthopedic Surgery

## 2022-09-07 DIAGNOSIS — Z89432 Acquired absence of left foot: Secondary | ICD-10-CM

## 2022-09-07 DIAGNOSIS — L97511 Non-pressure chronic ulcer of other part of right foot limited to breakdown of skin: Secondary | ICD-10-CM | POA: Diagnosis not present

## 2022-09-09 ENCOUNTER — Encounter: Payer: Self-pay | Admitting: Orthopedic Surgery

## 2022-09-09 NOTE — Progress Notes (Signed)
Office Visit Note   Patient: Mike Floyd           Date of Birth: 1961-08-08           MRN: [id number] Visit Date: 09/07/2022              Requested by: Redmond School, MD [address] [place],  [place] [address] PCP: Redmond School, MD  Chief Complaint  Patient presents with   Left Foot - Follow-up    05/19/2022 left transmet amputation       HPI: Patient is a 61 year old gentleman who presents over 3 months status post left transmetatarsal amputation with application of Kerecis micro graft.  Patient also presents with a persistent left heel ulcer.  Assessment & Plan: Visit Diagnoses:  1. History of transmetatarsal amputation of left foot ([place])   2. Non-pressure chronic ulcer of other part of right foot limited to breakdown of skin ([place])     Plan: Ulcer was debrided on the left recommended continue with protected weightbearing on the left.  Follow-Up Instructions: Return in about 3 weeks (around 09/28/2022).   Ortho Exam  Patient is alert, oriented, no adenopathy, well-dressed, normal affect, normal respiratory effort. Examination patient has a wound over the residual limb transmetatarsal amputation on the left.  This has healthy granulation tissue that does not probe to bone.  Patient has a large ulcer over the left heel.  After informed consent a 10 blade knife was used to debride the skin and soft tissue back to healthy viable granulation tissue.  The heel ulcer was 25 mm in diameter after debridement the transmetatarsal wound is 1 x 5 cm.  4 x 4 and Ace wrap are applied.  Imaging: No results found.    Labs: Lab Results  Component Value Date   HGBA1C 9.7 (H) 05/13/2022   HGBA1C 7.0 (H) 06/15/2017   HGBA1C 5.2 03/15/2013   CRP 0.6 05/16/2022   CRP 2.2 06/27/2019   LABURIC 5.5 03/07/2012   REPTSTATUS 05/18/2022 FINAL 05/13/2022   CULT  05/13/2022    NO GROWTH 5 DAYS Performed at [facility], [address], [place], [place] [address]      Lab  Results  Component Value Date   ALBUMIN 4.2 05/13/2022   ALBUMIN 4.6 12/27/2013   ALBUMIN 4.4 05/30/2013    Lab Results  Component Value Date   MG 1.7 05/20/2022   MG 2.0 05/19/2022   MG 1.9 05/18/2022   Lab Results  Component Value Date   VD25OH 18 (L) 06/15/2017    No results found for: "PREALBUMIN"    Latest Ref Rng & Units 05/20/2022    5:45 AM 05/19/2022    5:11 AM 05/18/2022    2:37 AM  CBC EXTENDED  WBC 4.0 - 10.5 K/uL 6.1  6.7  7.5   RBC 4.22 - 5.81 MIL/uL 4.62  5.34  5.15   Hemoglobin 13.0 - 17.0 g/dL 13.6  15.3  15.0   HCT 39.0 - 52.0 % 38.7  45.3  42.2   Platelets 150 - 400 K/uL 155  178  186   NEUT# 1.7 - 7.7 K/uL 4.3  4.6  4.9   Lymph# 0.7 - 4.0 K/uL 1.0  1.2  1.6      There is no height or weight on file to calculate BMI.  Orders:  No orders of the defined types were placed in this encounter.  No orders of the defined types were placed in this encounter.    Procedures:  No procedures performed  Clinical Data: No additional findings.  ROS:  All other systems negative, except as noted in the HPI. Review of Systems  Objective: Vital Signs: There were no vitals taken for this visit.  Specialty Comments:  No specialty comments available.  PMFS History: Patient Active Problem List   Diagnosis Date Noted   Acute osteomyelitis of metatarsal bone of left foot (HCC)    Subacute osteomyelitis, left ankle and foot ([place]) 05/13/2022   Non-pressure chronic ulcer of other part of right foot limited to breakdown of skin (HCC)    Contracture of right Achilles tendon    Mildly dilatd aortic root (HCC)    Contracture of left Achilles tendon    Metatarsal deformity, left    CAD (coronary artery disease)    Hallux hammertoe, left 12/11/2019   Diarrhea 06/27/2019   Diastasis recti 01/30/7627   Umbilical hernia without obstruction and without gangrene 08/22/2018   Vitamin D deficiency 06/23/2017   Essential hypertension, benign 06/15/2017   Mixed  hyperlipidemia 06/15/2017   Class 1 obesity due to excess calories with serious comorbidity and body mass index (BMI) of 31.0 to 31.9 in adult 06/15/2017   Malfunction of implantable cardioverter-defibrillator (ICD) electrode 10/07/2016   Chronic kidney disease (CKD), stage III (moderate) ([place]) 04/04/2015   Bladder neck obstruction 03/21/2015   Difficult or painful urination 03/21/2015   Flank pain 03/21/2015   Delayed onset of urination 03/21/2015   Calculus of kidney 03/21/2015   Erosive esophagitis 12/27/2013   Hypotension due to drugs 31/51/7616   Chronic systolic heart failure ([place]) 04/24/2013   CAP (community acquired pneumonia) 03/16/2013   HTN (hypertension) 03/15/2013   Hydrocele 03/15/2013   Spermatocele 03/15/2013   DOE (dyspnea on exertion) 03/15/2013   OSA (obstructive sleep apnea) 01/09/2013   Chronic kidney disease, stage 3 ([place]) 01/03/2013   Chest pain 12/26/2012   History of diagnostic tests 09/06/2012   TIA (transient ischemic attack)    Cardiomyopathy, nonischemic ([place]) 02/24/2010   AICD (automatic cardioverter/defibrillator) 02/24/2010   DM type 2 causing vascular disease ([place]) 06/27/2009   Gout 06/27/2009   Tobacco abuse, in remission 06/27/2009   COLONIC POLYPS 10/31/2008   GERD (gastroesophageal reflux disease) 10/31/2008   Past Medical History:  Diagnosis Date   AICD (automatic cardioverter/defibrillator) present 11/2007   a. 11/2007 SJM Current VR - single lead ICD  - Removed 2018 - "it was burning me"   Anxiety    CAD (coronary artery disease)    non-obstructive CAD by Cor CT in 2020 // Myoview 3/22: EF 57, small inf-sept defect c/w scar, no ischemia; low risk      Chest pain    a. 10/2007 Cath:  normal Cors.   CKD (chronic kidney disease), stage II    DDD (degenerative disc disease), lumbar    Diabetes mellitus DX: 2010   Erosive esophagitis    a. per EGD (08/2011), Dr. Laural Golden - Erosive reflux esophagitis improved but not completely healed since  previous EGD 3 years ago. Bx showing  ulcerated gatroesophageal junction mucosa. negative for H. pylori   GERD (gastroesophageal reflux disease)    Gout    Hearing deficit    a. wear bilateral hearing aides   History of hiatal hernia    Hypertension    Mildly dilatd aortic root ([place])    CMR 4/22: EF 52, no LGE; d/w Dr. Marlowe Kays root 38 mm (mildly dilated)   Myocardial infarction [facility]) 2011   Neuropathy    Feet and legs  Nonischemic dilated cardiomyopathy ([place])    a. H/O EF as low as 35-40% by LV gram 10/2007;  b. Echo 02/2011 EF 50-55%, inf HK, Gr 1 DD // CMR 4/22: EF 52, no LGE; d/w Dr. Marlowe Kays root 38 mm (mildly dilated)    Renal insufficiency    Sleep apnea    pt doesnt use, states"I cant afford one". PCP aware   Stroke ([place])    mini-stroke in 2014   TIA (transient ischemic attack)    July, 2013   Tobacco abuse, in remission 06/27/2009   Discontinued in 2009     Wears dentures    top plate   WPW (Wolff-Parkinson-White syndrome)    a. s/p RFCA @ [place]    Family History  Problem Relation Age of Onset   Heart attack Mother 66   Hypertension Mother    Diabetes Mother    Kidney disease Mother    Breast cancer Mother    Heart attack Father 64   Hypertension Father    Diabetes Father    Stroke Brother    Heart attack Brother 68   Stroke Maternal Grandmother 15   Diabetes Brother    Hypertension Brother     Past Surgical History:  Procedure Laterality Date   AMPUTATION Left 05/19/2022   Procedure: TRANSMETATARSAL AMPUTATION LEFT FOOT;  Surgeon: [name], MD;  Location: [place];  Service: Orthopedics;  Laterality: Left;   [place]     2009   CARDIAC DEFIBRILLATOR PLACEMENT     CHOLECYSTECTOMY     CHOLECYSTECTOMY, LAPAROSCOPIC     11/2007   COLONOSCOPY W/ POLYPECTOMY  2009   ELBOW SURGERY Left 06/2010   ESOPHAGEAL DILATION N/A 11/08/2014   Procedure: ESOPHAGEAL DILATION;  Surgeon: Rogene Houston, MD;  Location[facility];  Service: Endoscopy;  Laterality: N/A;  #56,    ESOPHAGOGASTRODUODENOSCOPY  03/31/2012   also 08/2011; [name]   ESOPHAGOGASTRODUODENOSCOPY (EGD) WITH PROPOFOL N/A 11/08/2014   Procedure: ESOPHAGOGASTRODUODENOSCOPY (EGD) WITH PROPOFOL;  Surgeon: Rogene Houston, MD;  Location: [facility];  Service: Endoscopy;  Laterality: N/A;  [place] is 52 , [place] is 37   FOOT ARTHRODESIS Left 10/24/2020   Procedure: LEFT GASTROCNEMIUS RECESSION, DORSIFLEXION OSTEOTOMY 1ST MT;  Surgeon: [name], MD;  Location: [place];  Service: Orthopedics;  Laterality: Left;   FOOT ARTHRODESIS Right 01/09/2021   Procedure: CLOSING WEDGE OSTEOTOMY RIGHT 1ST METATARSAL;  Surgeon: [name], MD;  Location: [place];  Service: Orthopedics;  Laterality: Right;   GASTROCNEMIUS RECESSION Right 01/09/2021   Procedure: RIGHT GASTROCNEMIUS RECESSION;  Surgeon: [name], MD;  Location: [place];  Service: Orthopedics;  Laterality: Right;   ICD LEAD REMOVAL N/A 10/07/2016   Procedure: ICD LEAD REMOVAL ;  Surgeon: Evans Lance, MD;  Location: [place];  Service: Cardiovascular;  Laterality: N/A;   [place]; performed at [facility]   TEE WITHOUT CARDIOVERSION N/A 10/07/2016   Procedure: TRANSESOPHAGEAL ECHOCARDIOGRAM (TEE);  Surgeon: Evans Lance, MD;  Location: [facility];  Service: Cardiovascular;  Laterality: N/A;   Social History   Occupational History   Occupation: Worked for [employer]: [employer]    Comment: Laid off in 11/11  Tobacco Use   Smoking status: Former    Packs/day: 1.50    Years: 35.00  Total pack years: 52.50    Types: Cigarettes    Start date: 06/30/1971    Quit date: 07/30/2010    Years since quitting: 12.1   Smokeless tobacco: Former    Types: Chew    Quit date: 03/26/1994  Vaping Use   Vaping Use: Former   Start date: 07/30/2010   Quit date: 06/29/2016  Substance and Sexual Activity   Alcohol use: No     Alcohol/week: 1.0 standard drink of alcohol    Types: 1 Cans of beer per week    Comment: used "years ago"   Drug use: No   Sexual activity: Never    Birth control/protection: None

## 2022-09-27 ENCOUNTER — Ambulatory Visit: Payer: Medicare Other | Admitting: Orthopedic Surgery

## 2022-09-27 DIAGNOSIS — L97521 Non-pressure chronic ulcer of other part of left foot limited to breakdown of skin: Secondary | ICD-10-CM | POA: Diagnosis not present

## 2022-09-27 DIAGNOSIS — Z89432 Acquired absence of left foot: Secondary | ICD-10-CM

## 2022-10-06 ENCOUNTER — Encounter: Payer: Self-pay | Admitting: Orthopedic Surgery

## 2022-10-06 NOTE — Progress Notes (Signed)
Office Visit Note   Patient: Mike Floyd           Date of Birth: 11/13/1960           MRN: [id number] Visit Date: 09/27/2022              Requested by: Redmond School, MD [address] [place],  [place] [address] PCP: Redmond School, MD  Chief Complaint  Patient presents with   Left Foot - Follow-up    05/19/2022 left transmet amputation      HPI: Patient is a 62 year old gentleman who is 4 months status post left transmetatarsal amputation.  Assessment & Plan: Visit Diagnoses:  1. History of transmetatarsal amputation of left foot ([place])   2. Non-pressure chronic ulcer of other part of left foot limited to breakdown of skin ([place])     Plan: Ulcer was debrided no exposed bone.  Continue with protective weightbearing and pressure offloading.  Follow-Up Instructions: Return in about 4 weeks (around 10/25/2022).   Ortho Exam  Patient is alert, oriented, no adenopathy, well-dressed, normal affect, normal respiratory effort. Examination patient is 4 months status post a left transmetatarsal amputation.  Patient has a decubitus heel ulcer with no surrounding cellulitis no drainage.  After informed consent a 10 blade knife was used to debride the skin and soft tissue on the heel back to healthy viable granulation tissue.  After debridement the ulcer is 3 cm in diameter without exposed bone.  Patient also has a wound over the transmetatarsal amputation and the skin and soft tissue was also debrided.  No exposed bone or tendon.  After debridement the wound is 1 x 5 cm.  Imaging: No results found.     Labs: Lab Results  Component Value Date   HGBA1C 9.7 (H) 05/13/2022   HGBA1C 7.0 (H) 06/15/2017   HGBA1C 5.2 03/15/2013   CRP 0.6 05/16/2022   CRP 2.2 06/27/2019   LABURIC 5.5 03/07/2012   REPTSTATUS 05/18/2022 FINAL 05/13/2022   CULT  05/13/2022    NO GROWTH 5 DAYS Performed at [facility], [address], [place], [place] [address]      Lab Results  Component  Value Date   ALBUMIN 4.2 05/13/2022   ALBUMIN 4.6 12/27/2013   ALBUMIN 4.4 05/30/2013    Lab Results  Component Value Date   MG 1.7 05/20/2022   MG 2.0 05/19/2022   MG 1.9 05/18/2022   Lab Results  Component Value Date   VD25OH 18 (L) 06/15/2017    No results found for: "PREALBUMIN"    Latest Ref Rng & Units 05/20/2022    5:45 AM 05/19/2022    5:11 AM 05/18/2022    2:37 AM  CBC EXTENDED  WBC 4.0 - 10.5 K/uL 6.1  6.7  7.5   RBC 4.22 - 5.81 MIL/uL 4.62  5.34  5.15   Hemoglobin 13.0 - 17.0 g/dL 13.6  15.3  15.0   HCT 39.0 - 52.0 % 38.7  45.3  42.2   Platelets 150 - 400 K/uL 155  178  186   NEUT# 1.7 - 7.7 K/uL 4.3  4.6  4.9   Lymph# 0.7 - 4.0 K/uL 1.0  1.2  1.6      There is no height or weight on file to calculate BMI.  Orders:  No orders of the defined types were placed in this encounter.  No orders of the defined types were placed in this encounter.    Procedures: No procedures performed  Clinical Data:  No additional findings.  ROS:  All other systems negative, except as noted in the HPI. Review of Systems  Objective: Vital Signs: There were no vitals taken for this visit.  Specialty Comments:  No specialty comments available.  PMFS History: Patient Active Problem List   Diagnosis Date Noted   Acute osteomyelitis of metatarsal bone of left foot (HCC)    Subacute osteomyelitis, left ankle and foot ([place]) 05/13/2022   Non-pressure chronic ulcer of other part of right foot limited to breakdown of skin (HCC)    Contracture of right Achilles tendon    Mildly dilatd aortic root (HCC)    Contracture of left Achilles tendon    Metatarsal deformity, left    CAD (coronary artery disease)    Hallux hammertoe, left 12/11/2019   Diarrhea 06/27/2019   Diastasis recti 123456   Umbilical hernia without obstruction and without gangrene 08/22/2018   Vitamin D deficiency 06/23/2017   Essential hypertension, benign 06/15/2017   Mixed hyperlipidemia  06/15/2017   Class 1 obesity due to excess calories with serious comorbidity and body mass index (BMI) of 31.0 to 31.9 in adult 06/15/2017   Malfunction of implantable cardioverter-defibrillator (ICD) electrode 10/07/2016   Chronic kidney disease (CKD), stage III (moderate) ([place]) 04/04/2015   Bladder neck obstruction 03/21/2015   Difficult or painful urination 03/21/2015   Flank pain 03/21/2015   Delayed onset of urination 03/21/2015   Calculus of kidney 03/21/2015   Erosive esophagitis 12/27/2013   Hypotension due to drugs XX123456   Chronic systolic heart failure ([place]) 04/24/2013   CAP (community acquired pneumonia) 03/16/2013   HTN (hypertension) 03/15/2013   Hydrocele 03/15/2013   Spermatocele 03/15/2013   DOE (dyspnea on exertion) 03/15/2013   OSA (obstructive sleep apnea) 01/09/2013   Chronic kidney disease, stage 3 ([place]) 01/03/2013   Chest pain 12/26/2012   History of diagnostic tests 09/06/2012   TIA (transient ischemic attack)    Cardiomyopathy, nonischemic ([place]) 02/24/2010   AICD (automatic cardioverter/defibrillator) 02/24/2010   DM type 2 causing vascular disease ([place]) 06/27/2009   Gout 06/27/2009   Tobacco abuse, in remission 06/27/2009   COLONIC POLYPS 10/31/2008   GERD (gastroesophageal reflux disease) 10/31/2008   Past Medical History:  Diagnosis Date   AICD (automatic cardioverter/defibrillator) present 11/2007   a. 11/2007 SJM Current VR - single lead ICD  - Removed 2018 - "it was burning me"   Anxiety    CAD (coronary artery disease)    non-obstructive CAD by Cor CT in 2020 // Myoview 3/22: EF 57, small inf-sept defect c/w scar, no ischemia; low risk      Chest pain    a. 10/2007 Cath:  normal Cors.   CKD (chronic kidney disease), stage II    DDD (degenerative disc disease), lumbar    Diabetes mellitus DX: 2010   Erosive esophagitis    a. per EGD (08/2011), Dr. Laural Golden - Erosive reflux esophagitis improved but not completely healed since previous EGD 3  years ago. Bx showing  ulcerated gatroesophageal junction mucosa. negative for H. pylori   GERD (gastroesophageal reflux disease)    Gout    Hearing deficit    a. wear bilateral hearing aides   History of hiatal hernia    Hypertension    Mildly dilatd aortic root ([place])    CMR 4/22: EF 52, no LGE; d/w Dr. Marlowe Kays root 38 mm (mildly dilated)   Myocardial infarction [facility]) 2011   Neuropathy    Feet and legs   Nonischemic dilated cardiomyopathy ([place])  a. H/O EF as low as 35-40% by LV gram 10/2007;  b. Echo 02/2011 EF 50-55%, inf HK, Gr 1 DD // CMR 4/22: EF 52, no LGE; d/w Dr. Marlowe Kays root 38 mm (mildly dilated)    Renal insufficiency    Sleep apnea    pt doesnt use, states"I cant afford one". PCP aware   Stroke ([place])    mini-stroke in 2014   TIA (transient ischemic attack)    July, 2013   Tobacco abuse, in remission 06/27/2009   Discontinued in 2009     Wears dentures    top plate   WPW (Wolff-Parkinson-White syndrome)    a. s/p RFCA @ [facility]    Family History  Problem Relation Age of Onset   Heart attack Mother 4   Hypertension Mother    Diabetes Mother    Kidney disease Mother    Breast cancer Mother    Heart attack Father 67   Hypertension Father    Diabetes Father    Stroke Brother    Heart attack Brother 68   Stroke Maternal Grandmother 12   Diabetes Brother    Hypertension Brother     Past Surgical History:  Procedure Laterality Date   AMPUTATION Left 05/19/2022   Procedure: TRANSMETATARSAL AMPUTATION LEFT FOOT;  Surgeon: [name], MD;  Location: [place];  Service: Orthopedics;  Laterality: Left;   [place]     2009   CARDIAC DEFIBRILLATOR PLACEMENT     CHOLECYSTECTOMY     CHOLECYSTECTOMY, LAPAROSCOPIC     11/2007   COLONOSCOPY W/ POLYPECTOMY  2009   ELBOW SURGERY Left 06/2010   ESOPHAGEAL DILATION N/A 11/08/2014   Procedure: ESOPHAGEAL DILATION;  Surgeon: Rogene Houston, MD;   Location: [facility];  Service: Endoscopy;  Laterality: N/A;  #56,    ESOPHAGOGASTRODUODENOSCOPY  03/31/2012   also 08/2011; [name]   ESOPHAGOGASTRODUODENOSCOPY (EGD) WITH PROPOFOL N/A 11/08/2014   Procedure: ESOPHAGOGASTRODUODENOSCOPY (EGD) WITH PROPOFOL;  Surgeon: Rogene Houston, MD;  Location: [facility];  Service: Endoscopy;  Laterality: N/A;  [place] is 25 , [place]unction is 37   FOOT ARTHRODESIS Left 10/24/2020   Procedure: LEFT GASTROCNEMIUS RECESSION, DORSIFLEXION OSTEOTOMY 1ST MT;  Surgeon: [name], MD;  Location: [place];  Service: Orthopedics;  Laterality: Left;   FOOT ARTHRODESIS Right 01/09/2021   Procedure: CLOSING WEDGE OSTEOTOMY RIGHT 1ST METATARSAL;  Surgeon: [name], MD;  Location: [place];  Service: Orthopedics;  Laterality: Right;   GASTROCNEMIUS RECESSION Right 01/09/2021   Procedure: RIGHT GASTROCNEMIUS RECESSION;  Surgeon: [name], MD;  Location: [place];  Service: Orthopedics;  Laterality: Right;   ICD LEAD REMOVAL N/A 10/07/2016   Procedure: ICD LEAD REMOVAL ;  Surgeon: Evans Lance, MD;  Location: [place];  Service: Cardiovascular;  Laterality: N/A;   [place]; performed at [facility]   TEE WITHOUT CARDIOVERSION N/A 10/07/2016   Procedure: TRANSESOPHAGEAL ECHOCARDIOGRAM (TEE);  Surgeon: Evans Lance, MD;  [facility];  Service: Cardiovascular;  Laterality: N/A;   Social History   Occupational History   Occupation: Worked for [employer]: [employer]    Comment: Laid off in 11/11  Tobacco Use   Smoking status: Former    Packs/day: 1.50    Years: 35.00    Total pack years: 52.50  Types: Cigarettes    Start date: 06/30/1971    Quit date: 07/30/2010    Years since quitting: 12.1   Smokeless tobacco: Former    Types: Chew    Quit date: 03/26/1994  Vaping Use   Vaping Use: Former   Start date: 07/30/2010   Quit date: 06/29/2016  Substance and Sexual Activity   Alcohol use: No     Alcohol/week: 1.0 standard drink of alcohol    Types: 1 Cans of beer per week    Comment: used "years ago"   Drug use: No   Sexual activity: Never    Birth control/protection: None

## 2022-10-25 ENCOUNTER — Ambulatory Visit: Payer: Medicare Other | Admitting: Orthopedic Surgery

## 2022-10-25 ENCOUNTER — Encounter: Payer: Self-pay | Admitting: Orthopedic Surgery

## 2022-10-25 DIAGNOSIS — Z89432 Acquired absence of left foot: Secondary | ICD-10-CM | POA: Diagnosis not present

## 2022-10-25 NOTE — Progress Notes (Signed)
Office Visit Note   Patient: Mike Floyd           Date of Birth: 05/12/1961           MRN: [id number] Visit Date: 10/25/2022              Requested by: Redmond School, MD [address] [place],  [place] [address] PCP: Redmond School, MD  Chief Complaint  Patient presents with   Left Foot - Follow-up    05/19/2022 left transmet amputation      HPI: Patient is a 62 year old gentleman is 5 months status post left transmetatarsal amputation.  Assessment & Plan: Visit Diagnoses:  1. History of transmetatarsal amputation of left foot ([place])     Plan: The incision is well-healed there is no heel ulcer.  Patient is given a prescription for [employer] for extra-depth shoe custom orthotic spacer and carbon plate.  Follow-Up Instructions: Return in about 2 months (around 12/25/2022).   Ortho Exam  Patient is alert, oriented, no adenopathy, well-dressed, normal affect, normal respiratory effort. Examination the heel ulcer and transmetatarsal amputation is well-healed.  Patient has 1 residual area of granulation tissue that is 2 mm in diameter this was touched with silver nitrate.  This does not probe to bone or tendon there is no drainage no cellulitis.  Imaging: No results found.    Labs: Lab Results  Component Value Date   HGBA1C 9.7 (H) 05/13/2022   HGBA1C 7.0 (H) 06/15/2017   HGBA1C 5.2 03/15/2013   CRP 0.6 05/16/2022   CRP 2.2 06/27/2019   LABURIC 5.5 03/07/2012   REPTSTATUS 05/18/2022 FINAL 05/13/2022   CULT  05/13/2022    NO GROWTH 5 DAYS Performed at [facility], [address], [place], [place] [address]      Lab Results  Component Value Date   ALBUMIN 4.2 05/13/2022   ALBUMIN 4.6 12/27/2013   ALBUMIN 4.4 05/30/2013    Lab Results  Component Value Date   MG 1.7 05/20/2022   MG 2.0 05/19/2022   MG 1.9 05/18/2022   Lab Results  Component Value Date   VD25OH 18 (L) 06/15/2017    No results found for: "PREALBUMIN"    Latest Ref Rng & Units  05/20/2022    5:45 AM 05/19/2022    5:11 AM 05/18/2022    2:37 AM  CBC EXTENDED  WBC 4.0 - 10.5 K/uL 6.1  6.7  7.5   RBC 4.22 - 5.81 MIL/uL 4.62  5.34  5.15   Hemoglobin 13.0 - 17.0 g/dL 13.6  15.3  15.0   HCT 39.0 - 52.0 % 38.7  45.3  42.2   Platelets 150 - 400 K/uL 155  178  186   NEUT# 1.7 - 7.7 K/uL 4.3  4.6  4.9   Lymph# 0.7 - 4.0 K/uL 1.0  1.2  1.6      There is no height or weight on file to calculate BMI.  Orders:  No orders of the defined types were placed in this encounter.  No orders of the defined types were placed in this encounter.    Procedures: No procedures performed  Clinical Data: No additional findings.  ROS:  All other systems negative, except as noted in the HPI. Review of Systems  Objective: Vital Signs: There were no vitals taken for this visit.  Specialty Comments:  No specialty comments available.  PMFS History: Patient Active Problem List   Diagnosis Date Noted   Acute osteomyelitis of metatarsal bone of left foot (  [place])    Subacute osteomyelitis, left ankle and foot ([place]) 05/13/2022   Non-pressure chronic ulcer of other part of right foot limited to breakdown of skin ([place])    Contracture of right Achilles tendon    Mildly dilatd aortic root (HCC)    Contracture of left Achilles tendon    Metatarsal deformity, left    CAD (coronary artery disease)    Hallux hammertoe, left 12/11/2019   Diarrhea 06/27/2019   Diastasis recti 123456   Umbilical hernia without obstruction and without gangrene 08/22/2018   Vitamin D deficiency 06/23/2017   Essential hypertension, benign 06/15/2017   Mixed hyperlipidemia 06/15/2017   Class 1 obesity due to excess calories with serious comorbidity and body mass index (BMI) of 31.0 to 31.9 in adult 06/15/2017   Malfunction of implantable cardioverter-defibrillator (ICD) electrode 10/07/2016   Chronic kidney disease (CKD), stage III (moderate) ([place]) 04/04/2015   Bladder neck obstruction 03/21/2015    Difficult or painful urination 03/21/2015   Flank pain 03/21/2015   Delayed onset of urination 03/21/2015   Calculus of kidney 03/21/2015   Erosive esophagitis 12/27/2013   Hypotension due to drugs XX123456   Chronic systolic heart failure ([place]) 04/24/2013   CAP (community acquired pneumonia) 03/16/2013   HTN (hypertension) 03/15/2013   Hydrocele 03/15/2013   Spermatocele 03/15/2013   DOE (dyspnea on exertion) 03/15/2013   OSA (obstructive sleep apnea) 01/09/2013   Chronic kidney disease, stage 3 ([place]) 01/03/2013   Chest pain 12/26/2012   History of diagnostic tests 09/06/2012   TIA (transient ischemic attack)    Cardiomyopathy, nonischemic ([place]) 02/24/2010   AICD (automatic cardioverter/defibrillator) 02/24/2010   DM type 2 causing vascular disease ([place]) 06/27/2009   Gout 06/27/2009   Tobacco abuse, in remission 06/27/2009   COLONIC POLYPS 10/31/2008   GERD (gastroesophageal reflux disease) 10/31/2008   Past Medical History:  Diagnosis Date   AICD (automatic cardioverter/defibrillator) present 11/2007   a. 11/2007 SJM Current VR - single lead ICD  - Removed 2018 - "it was burning me"   Anxiety    CAD (coronary artery disease)    non-obstructive CAD by Cor CT in 2020 // Myoview 3/22: EF 57, small inf-sept defect c/w scar, no ischemia; low risk      Chest pain    a. 10/2007 Cath:  normal Cors.   CKD (chronic kidney disease), stage II    DDD (degenerative disc disease), lumbar    Diabetes mellitus DX: 2010   Erosive esophagitis    a. per EGD (08/2011), Dr. Laural Golden - Erosive reflux esophagitis improved but not completely healed since previous EGD 3 years ago. Bx showing  ulcerated gatroesophageal junction mucosa. negative for H. pylori   GERD (gastroesophageal reflux disease)    Gout    Hearing deficit    a. wear bilateral hearing aides   History of hiatal hernia    Hypertension    Mildly dilatd aortic root ([place])    CMR 4/22: EF 52, no LGE; d/w Dr. Marlowe Kays root 38 mm  (mildly dilated)   Myocardial infarction [facility]) 2011   Neuropathy    Feet and legs   Nonischemic dilated cardiomyopathy ([place])    a. H/O EF as low as 35-40% by LV gram 10/2007;  b. Echo 02/2011 EF 50-55%, inf HK, Gr 1 DD // CMR 4/22: EF 52, no LGE; d/w Dr. Marlowe Kays root 38 mm (mildly dilated)    Renal insufficiency    Sleep apnea    pt doesnt use, states"I cant afford one". PCP  aware   Stroke ([place])    mini-stroke in 2014   TIA (transient ischemic attack)    July, 2013   Tobacco abuse, in remission 06/27/2009   Discontinued in 2009     Wears dentures    top plate   WPW (Wolff-Parkinson-White syndrome)    a. s/p RFCA @ [facility]    Family History  Problem Relation Age of Onset   Heart attack Mother 71   Hypertension Mother    Diabetes Mother    Kidney disease Mother    Breast cancer Mother    Heart attack Father 40   Hypertension Father    Diabetes Father    Stroke Brother    Heart attack Brother 8   Stroke Maternal Grandmother 52   Diabetes Brother    Hypertension Brother     Past Surgical History:  Procedure Laterality Date   AMPUTATION Left 05/19/2022   Procedure: TRANSMETATARSAL AMPUTATION LEFT FOOT;  Surgeon: [name], MD;  Location: [place];  Service: Orthopedics;  Laterality: Left;   [place]     2009   CARDIAC DEFIBRILLATOR PLACEMENT     CHOLECYSTECTOMY     CHOLECYSTECTOMY, LAPAROSCOPIC     11/2007   COLONOSCOPY W/ POLYPECTOMY  2009   ELBOW SURGERY Left 06/2010   ESOPHAGEAL DILATION N/A 11/08/2014   Procedure: ESOPHAGEAL DILATION;  Surgeon: Rogene Houston, MD;  Location[facility];  Service: Endoscopy;  Laterality: N/A;  #56,    ESOPHAGOGASTRODUODENOSCOPY  03/31/2012   also 08/2011; [name]   ESOPHAGOGASTRODUODENOSCOPY (EGD) WITH PROPOFOL N/A 11/08/2014   Procedure: ESOPHAGOGASTRODUODENOSCOPY (EGD) WITH PROPOFOL;  Surgeon: Rogene Houston, MD;  Location: [facility];  Service: Endoscopy;  Laterality: N/A;   [place] is 16 , GE [place] is 37   FOOT ARTHRODESIS Left 10/24/2020   Procedure: LEFT GASTROCNEMIUS RECESSION, DORSIFLEXION OSTEOTOMY 1ST MT;  Surgeon: [name], MD;  Location: [place];  Service: Orthopedics;  Laterality: Left;   FOOT ARTHRODESIS Right 01/09/2021   Procedure: CLOSING WEDGE OSTEOTOMY RIGHT 1ST METATARSAL;  Surgeon: [name], MD;  Location: [place];  Service: Orthopedics;  Laterality: Right;   GASTROCNEMIUS RECESSION Right 01/09/2021   Procedure: RIGHT GASTROCNEMIUS RECESSION;  Surgeon: [name], MD;  Location: [place];  Service: Orthopedics;  Laterality: Right;   ICD LEAD REMOVAL N/A 10/07/2016   Procedure: ICD LEAD REMOVAL ;  Surgeon: Evans Lance, MD;  Location: [place];  Service: Cardiovascular;  Laterality: N/A;   [place]; performed at [facility]   TEE WITHOUT CARDIOVERSION N/A 10/07/2016   Procedure: TRANSESOPHAGEAL ECHOCARDIOGRAM (TEE);  Surgeon: Evans Lance, MD;  Location: [place];  Service: Cardiovascular;  Laterality: N/A;   Social History   Occupational History   Occupation: Worked for [employer]: [place]: Laid off in 11/11  Tobacco Use   Smoking status: Former    Packs/day: 1.50    Years: 35.00    Additional pack years: 0.00    Total pack years: 52.50    Types: Cigarettes    Start date: 06/30/1971    Quit date: 07/30/2010    Years since quitting: 12.2   Smokeless tobacco: Former    Types: Chew    Quit date: 03/26/1994  Vaping Use   Vaping Use: Former   Start date: 07/30/2010  Quit date: 06/29/2016  Substance and Sexual Activity   Alcohol use: No    Alcohol/week: 1.0 standard drink of alcohol    Types: 1 Cans of beer per week    Comment: used "years ago"   Drug use: No   Sexual activity: Never    Birth control/protection: None

## 2022-10-26 DIAGNOSIS — M1991 Primary osteoarthritis, unspecified site: Secondary | ICD-10-CM | POA: Diagnosis not present

## 2022-10-26 DIAGNOSIS — E11319 Type 2 diabetes mellitus with unspecified diabetic retinopathy without macular edema: Secondary | ICD-10-CM | POA: Diagnosis not present

## 2022-10-26 DIAGNOSIS — E1165 Type 2 diabetes mellitus with hyperglycemia: Secondary | ICD-10-CM | POA: Diagnosis not present

## 2022-10-26 DIAGNOSIS — Z0001 Encounter for general adult medical examination with abnormal findings: Secondary | ICD-10-CM | POA: Diagnosis not present

## 2022-10-26 DIAGNOSIS — G894 Chronic pain syndrome: Secondary | ICD-10-CM | POA: Diagnosis not present

## 2022-10-26 DIAGNOSIS — D518 Other vitamin B12 deficiency anemias: Secondary | ICD-10-CM | POA: Diagnosis not present

## 2022-10-26 DIAGNOSIS — E559 Vitamin D deficiency, unspecified: Secondary | ICD-10-CM | POA: Diagnosis not present

## 2022-10-26 DIAGNOSIS — E1140 Type 2 diabetes mellitus with diabetic neuropathy, unspecified: Secondary | ICD-10-CM | POA: Diagnosis not present

## 2022-10-26 DIAGNOSIS — N1831 Chronic kidney disease, stage 3a: Secondary | ICD-10-CM | POA: Diagnosis not present

## 2022-10-26 DIAGNOSIS — G9332 Myalgic encephalomyelitis/chronic fatigue syndrome: Secondary | ICD-10-CM | POA: Diagnosis not present

## 2022-10-26 DIAGNOSIS — E1129 Type 2 diabetes mellitus with other diabetic kidney complication: Secondary | ICD-10-CM | POA: Diagnosis not present

## 2022-10-26 DIAGNOSIS — I43 Cardiomyopathy in diseases classified elsewhere: Secondary | ICD-10-CM | POA: Diagnosis not present

## 2022-10-26 DIAGNOSIS — E782 Mixed hyperlipidemia: Secondary | ICD-10-CM | POA: Diagnosis not present

## 2022-11-29 DIAGNOSIS — N1831 Chronic kidney disease, stage 3a: Secondary | ICD-10-CM | POA: Diagnosis not present

## 2022-11-29 DIAGNOSIS — N183 Chronic kidney disease, stage 3 unspecified: Secondary | ICD-10-CM | POA: Diagnosis not present

## 2022-11-29 DIAGNOSIS — E1165 Type 2 diabetes mellitus with hyperglycemia: Secondary | ICD-10-CM | POA: Diagnosis not present

## 2022-11-29 DIAGNOSIS — I43 Cardiomyopathy in diseases classified elsewhere: Secondary | ICD-10-CM | POA: Diagnosis not present

## 2022-11-29 DIAGNOSIS — E1129 Type 2 diabetes mellitus with other diabetic kidney complication: Secondary | ICD-10-CM | POA: Diagnosis not present

## 2022-11-29 DIAGNOSIS — G894 Chronic pain syndrome: Secondary | ICD-10-CM | POA: Diagnosis not present

## 2022-11-29 DIAGNOSIS — L97509 Non-pressure chronic ulcer of other part of unspecified foot with unspecified severity: Secondary | ICD-10-CM | POA: Diagnosis not present

## 2022-11-29 DIAGNOSIS — E11319 Type 2 diabetes mellitus with unspecified diabetic retinopathy without macular edema: Secondary | ICD-10-CM | POA: Diagnosis not present

## 2022-11-29 DIAGNOSIS — I200 Unstable angina: Secondary | ICD-10-CM | POA: Diagnosis not present

## 2022-12-23 DIAGNOSIS — G894 Chronic pain syndrome: Secondary | ICD-10-CM | POA: Diagnosis not present

## 2022-12-23 DIAGNOSIS — I43 Cardiomyopathy in diseases classified elsewhere: Secondary | ICD-10-CM | POA: Diagnosis not present

## 2022-12-23 DIAGNOSIS — I77810 Thoracic aortic ectasia: Secondary | ICD-10-CM | POA: Diagnosis not present

## 2022-12-23 DIAGNOSIS — N1831 Chronic kidney disease, stage 3a: Secondary | ICD-10-CM | POA: Diagnosis not present

## 2022-12-23 DIAGNOSIS — E11319 Type 2 diabetes mellitus with unspecified diabetic retinopathy without macular edema: Secondary | ICD-10-CM | POA: Diagnosis not present

## 2022-12-23 DIAGNOSIS — E1129 Type 2 diabetes mellitus with other diabetic kidney complication: Secondary | ICD-10-CM | POA: Diagnosis not present

## 2022-12-23 DIAGNOSIS — M1991 Primary osteoarthritis, unspecified site: Secondary | ICD-10-CM | POA: Diagnosis not present

## 2022-12-23 DIAGNOSIS — E1140 Type 2 diabetes mellitus with diabetic neuropathy, unspecified: Secondary | ICD-10-CM | POA: Diagnosis not present

## 2022-12-23 DIAGNOSIS — E1165 Type 2 diabetes mellitus with hyperglycemia: Secondary | ICD-10-CM | POA: Diagnosis not present

## 2022-12-27 ENCOUNTER — Ambulatory Visit: Payer: Medicare Other | Admitting: Orthopedic Surgery

## 2022-12-27 ENCOUNTER — Encounter: Payer: Self-pay | Admitting: Orthopedic Surgery

## 2022-12-27 DIAGNOSIS — Z89432 Acquired absence of left foot: Secondary | ICD-10-CM | POA: Diagnosis not present

## 2022-12-27 NOTE — Progress Notes (Signed)
Office Visit Note   Patient: Mike Floyd           Date of Birth: 1960/12/24           MRN: [id number] Visit Date: 12/27/2022              Requested by: Elfredia Nevins, MD [address] [place],  [place] [address] PCP: Elfredia Nevins, MD  Chief Complaint  Patient presents with   Left Foot - Follow-up    Hx left transmet amputation 05/19/2022      HPI: Patient is a 62 year old gentleman is 7 months status post left transmetatarsal amputation.  Patient is working with Technical sales engineer for his orthotic spacer carbon plate and braces.  Assessment & Plan: Visit Diagnoses:  1. History of transmetatarsal amputation of left foot (HCC)     Plan: Patient will continue working with [employer] for his prosthesis.  Follow-Up Instructions: Return if symptoms worsen or fail to improve.   Ortho Exam  Patient is alert, oriented, no adenopathy, well-dressed, normal affect, normal respiratory effort. Examination the residual limb is well-healed no redness cellulitis no open ulcers.  Patient's foot is plantigrade.  Imaging: No results found. No images are attached to the encounter.  Labs: Lab Results  Component Value Date   HGBA1C 9.7 (H) 05/13/2022   HGBA1C 7.0 (H) 06/15/2017   HGBA1C 5.2 03/15/2013   CRP 0.6 05/16/2022   CRP 2.2 06/27/2019   LABURIC 5.5 03/07/2012   REPTSTATUS 05/18/2022 FINAL 05/13/2022   CULT  05/13/2022    NO GROWTH 5 DAYS Performed at [facility], [address], [place], [place] [address]      Lab Results  Component Value Date   ALBUMIN 4.2 05/13/2022   ALBUMIN 4.6 12/27/2013   ALBUMIN 4.4 05/30/2013    Lab Results  Component Value Date   MG 1.7 05/20/2022   MG 2.0 05/19/2022   MG 1.9 05/18/2022   Lab Results  Component Value Date   VD25OH 18 (L) 06/15/2017    No results found for: "PREALBUMIN"    Latest Ref Rng & Units 05/20/2022    5:45 AM 05/19/2022    5:11 AM 05/18/2022    2:37 AM  CBC EXTENDED  WBC 4.0 - 10.5 K/uL 6.1  6.7  7.5    RBC 4.22 - 5.81 MIL/uL 4.62  5.34  5.15   Hemoglobin 13.0 - 17.0 g/dL 27.2  53.6  64.4   HCT 39.0 - 52.0 % 38.7  45.3  42.2   Platelets 150 - 400 K/uL 155  178  186   NEUT# 1.7 - 7.7 K/uL 4.3  4.6  4.9   Lymph# 0.7 - 4.0 K/uL 1.0  1.2  1.6      There is no height or weight on file to calculate BMI.  Orders:  No orders of the defined types were placed in this encounter.  No orders of the defined types were placed in this encounter.    Procedures: No procedures performed  Clinical Data: No additional findings.  ROS:  All other systems negative, except as noted in the HPI. Review of Systems  Objective: Vital Signs: There were no vitals taken for this visit.  Specialty Comments:  No specialty comments available.  PMFS History: Patient Active Problem List   Diagnosis Date Noted   Acute osteomyelitis of metatarsal bone of left foot (HCC)    Subacute osteomyelitis, left ankle and foot (HCC) 05/13/2022   Non-pressure chronic ulcer of other part of right foot limited  to breakdown of skin (HCC)    Contracture of right Achilles tendon    Mildly dilatd aortic root (HCC)    Contracture of left Achilles tendon    Metatarsal deformity, left    CAD (coronary artery disease)    Hallux hammertoe, left 12/11/2019   Diarrhea 06/27/2019   Diastasis recti 08/22/2018   Umbilical hernia without obstruction and without gangrene 08/22/2018   Vitamin D deficiency 06/23/2017   Essential hypertension, benign 06/15/2017   Mixed hyperlipidemia 06/15/2017   Class 1 obesity due to excess calories with serious comorbidity and body mass index (BMI) of 31.0 to 31.9 in adult 06/15/2017   Malfunction of implantable cardioverter-defibrillator (ICD) electrode 10/07/2016   Chronic kidney disease (CKD), stage III (moderate) (HCC) 04/04/2015   Bladder neck obstruction 03/21/2015   Difficult or painful urination 03/21/2015   Flank pain 03/21/2015   Delayed onset of urination 03/21/2015   Calculus of  kidney 03/21/2015   Erosive esophagitis 12/27/2013   Hypotension due to drugs 07/02/2013   Chronic systolic heart failure (HCC) 04/24/2013   CAP (community acquired pneumonia) 03/16/2013   HTN (hypertension) 03/15/2013   Hydrocele 03/15/2013   Spermatocele 03/15/2013   DOE (dyspnea on exertion) 03/15/2013   OSA (obstructive sleep apnea) 01/09/2013   Chronic kidney disease, stage 3 (HCC) 01/03/2013   Chest pain 12/26/2012   History of diagnostic tests 09/06/2012   TIA (transient ischemic attack)    Cardiomyopathy, nonischemic (HCC) 02/24/2010   AICD (automatic cardioverter/defibrillator) 02/24/2010   DM type 2 causing vascular disease (HCC) 06/27/2009   Gout 06/27/2009   Tobacco abuse, in remission 06/27/2009   COLONIC POLYPS 10/31/2008   GERD (gastroesophageal reflux disease) 10/31/2008   Past Medical History:  Diagnosis Date   AICD (automatic cardioverter/defibrillator) present 11/2007   a. 11/2007 S[facility] Current VR - single lead ICD  - Removed 2018 - "it was burning me"   Anxiety    CAD (coronary artery disease)    non-obstructive CAD by Cor CT in 2020 // Myoview 3/22: EF 57, small inf-sept defect c/w scar, no ischemia; low risk      Chest pain    a. 10/2007 Cath:  normal Cors.   CKD (chronic kidney disease), stage II    DDD (degenerative disc disease), lumbar    Diabetes mellitus DX: 2010   Erosive esophagitis    a. per EGD (08/2011), Dr. Karilyn Cota - Erosive reflux esophagitis improved but not completely healed since previous EGD 3 years ago. Bx showing  ulcerated gatroesophageal junction mucosa. negative for H. pylori   GERD (gastroesophageal reflux disease)    Gout    Hearing deficit    a. wear bilateral hearing aides   History of hiatal hernia    Hypertension    Mildly dilatd aortic root (HCC)    CMR 4/22: EF 52, no LGE; d/w Dr. Lorretta Harp root 38 mm (mildly dilated)   Myocardial infarction [facility]) 2011   Neuropathy    Feet and legs   Nonischemic dilated cardiomyopathy (HCC)     a. H/O EF as low as 35-40% by LV gram 10/2007;  b. Echo 02/2011 EF 50-55%, inf HK, Gr 1 DD // CMR 4/22: EF 52, no LGE; d/w Dr. Lorretta Harp root 38 mm (mildly dilated)    Renal insufficiency    Sleep apnea    pt doesnt use, states"I cant afford one". PCP aware   Stroke (HCC)    mini-stroke in 2014   TIA (transient ischemic attack)    July, 2013  Tobacco abuse, in remission 06/27/2009   Discontinued in 2009     Wears dentures    top plate   WPW (Wolff-Parkinson-White syndrome)    a. s/p RFCA @ [facility] - 1999    Family History  Problem Relation Age of Onset   Heart attack Mother 82   Hypertension Mother    Diabetes Mother    Kidney disease Mother    Breast cancer Mother    Heart attack Father 66   Hypertension Father    Diabetes Father    Stroke Brother    Heart attack Brother 48   Stroke Maternal Grandmother 49   Diabetes Brother    Hypertension Brother     Past Surgical History:  Procedure Laterality Date   AMPUTATION Left 05/19/2022   Procedure: TRANSMETATARSAL AMPUTATION LEFT FOOT;  Surgeon: Nadara Mustard, MD;  Location: [facility] OR;  Service: Orthopedics;  Laterality: Left;   APPENDECTOMY     BACK SURGERY     1995   CARDIAC CATHETERIZATION     2009   CARDIAC DEFIBRILLATOR PLACEMENT     CHOLECYSTECTOMY     CHOLECYSTECTOMY, LAPAROSCOPIC     11/2007   COLONOSCOPY W/ POLYPECTOMY  2009   ELBOW SURGERY Left 06/2010   ESOPHAGEAL DILATION N/A 11/08/2014   Procedure: ESOPHAGEAL DILATION;  Surgeon: Malissa Hippo, MD;  Location: [facility] ORS;  Service: Endoscopy;  Laterality: N/A;  #56,    ESOPHAGOGASTRODUODENOSCOPY  03/31/2012   also 08/2011; [name]   ESOPHAGOGASTRODUODENOSCOPY (EGD) WITH PROPOFOL N/A 11/08/2014   Procedure: ESOPHAGOGASTRODUODENOSCOPY (EGD) WITH PROPOFOL;  Surgeon: Malissa Hippo, MD;  Location: [facility] ORS;  Service: Endoscopy;  Laterality: N/A;  [place] is 63 , [place] is 35   FOOT ARTHRODESIS Left 10/24/2020   Procedure: LEFT GASTROCNEMIUS RECESSION, DORSIFLEXION  OSTEOTOMY 1ST MT;  Surgeon: Nadara Mustard, MD;  Location: [facility] OR;  Service: Orthopedics;  Laterality: Left;   FOOT ARTHRODESIS Right 01/09/2021   Procedure: CLOSING WEDGE OSTEOTOMY RIGHT 1ST METATARSAL;  Surgeon: Nadara Mustard, MD;  Location: [facility] OR;  Service: Orthopedics;  Laterality: Right;   GASTROCNEMIUS RECESSION Right 01/09/2021   Procedure: RIGHT GASTROCNEMIUS RECESSION;  Surgeon: Nadara Mustard, MD;  Location: [facility] OR;  Service: Orthopedics;  Laterality: Right;   ICD LEAD REMOVAL N/A 10/07/2016   Procedure: ICD LEAD REMOVAL ;  Surgeon: Marinus Maw, MD;  Location: [facility] OR;  Service: Cardiovascular;  Laterality: N/A;   MULTIPLE TOOTH EXTRACTIONS     RADIOFREQUENCY ABLATION  1999   WPW; performed at [facility]   TEE WITHOUT CARDIOVERSION N/A 10/07/2016   Procedure: TRANSESOPHAGEAL ECHOCARDIOGRAM (TEE);  Surgeon: Marinus Maw, MD;  Location: [facility] OR;  Service: Cardiovascular;  Laterality: N/A;   Social History   Occupational History   Occupation: Worked for [employer]    Employer: [employer]    Comment: Laid off in 11/11  Tobacco Use   Smoking status: Former    Packs/day: 1.50    Years: 35.00    Additional pack years: 0.00    Total pack years: 52.50    Types: Cigarettes    Start date: 06/30/1971    Quit date: 07/30/2010    Years since quitting: 12.4   Smokeless tobacco: Former    Types: Chew    Quit date: 03/26/1994  Vaping Use   Vaping Use: Former   Start date: 07/30/2010   Quit date: 06/29/2016  Substance and Sexual Activity   Alcohol use: No    Alcohol/week: 1.0 standard drink of alcohol  Types: 1 Cans of beer per week    Comment: used "years ago"   Drug use: No   Sexual activity: Never    Birth control/protection: None

## 2023-01-10 ENCOUNTER — Other Ambulatory Visit: Payer: Self-pay | Admitting: Student

## 2023-01-12 DIAGNOSIS — E232 Diabetes insipidus: Secondary | ICD-10-CM | POA: Diagnosis not present

## 2023-01-12 DIAGNOSIS — Z89412 Acquired absence of left great toe: Secondary | ICD-10-CM | POA: Diagnosis not present

## 2023-01-12 DIAGNOSIS — S98212S Complete traumatic amputation of two or more left lesser toes, sequela: Secondary | ICD-10-CM | POA: Diagnosis not present

## 2023-01-20 DIAGNOSIS — E11319 Type 2 diabetes mellitus with unspecified diabetic retinopathy without macular edema: Secondary | ICD-10-CM | POA: Diagnosis not present

## 2023-01-20 DIAGNOSIS — E1165 Type 2 diabetes mellitus with hyperglycemia: Secondary | ICD-10-CM | POA: Diagnosis not present

## 2023-01-20 DIAGNOSIS — M67912 Unspecified disorder of synovium and tendon, left shoulder: Secondary | ICD-10-CM | POA: Diagnosis not present

## 2023-01-20 DIAGNOSIS — N1831 Chronic kidney disease, stage 3a: Secondary | ICD-10-CM | POA: Diagnosis not present

## 2023-01-20 DIAGNOSIS — M67911 Unspecified disorder of synovium and tendon, right shoulder: Secondary | ICD-10-CM | POA: Diagnosis not present

## 2023-01-20 DIAGNOSIS — E1129 Type 2 diabetes mellitus with other diabetic kidney complication: Secondary | ICD-10-CM | POA: Diagnosis not present

## 2023-01-20 DIAGNOSIS — G8929 Other chronic pain: Secondary | ICD-10-CM | POA: Diagnosis not present

## 2023-01-20 DIAGNOSIS — I77810 Thoracic aortic ectasia: Secondary | ICD-10-CM | POA: Diagnosis not present

## 2023-01-20 DIAGNOSIS — I43 Cardiomyopathy in diseases classified elsewhere: Secondary | ICD-10-CM | POA: Diagnosis not present

## 2023-01-20 DIAGNOSIS — E1140 Type 2 diabetes mellitus with diabetic neuropathy, unspecified: Secondary | ICD-10-CM | POA: Diagnosis not present

## 2023-02-09 ENCOUNTER — Other Ambulatory Visit: Payer: Self-pay | Admitting: Student

## 2023-02-26 ENCOUNTER — Other Ambulatory Visit: Payer: Self-pay

## 2023-02-26 ENCOUNTER — Encounter (HOSPITAL_COMMUNITY): Payer: Self-pay | Admitting: Emergency Medicine

## 2023-02-26 ENCOUNTER — Emergency Department (HOSPITAL_COMMUNITY): Payer: Medicare Other

## 2023-02-26 ENCOUNTER — Emergency Department (HOSPITAL_COMMUNITY)
Admission: EM | Admit: 2023-02-26 | Discharge: 2023-02-26 | Disposition: A | Discharge status: Home / Self Care | Payer: Medicare Other | Attending: Emergency Medicine | Admitting: Emergency Medicine

## 2023-02-26 DIAGNOSIS — E119 Type 2 diabetes mellitus without complications: Secondary | ICD-10-CM | POA: Insufficient documentation

## 2023-02-26 DIAGNOSIS — R0689 Other abnormalities of breathing: Secondary | ICD-10-CM | POA: Diagnosis not present

## 2023-02-26 DIAGNOSIS — I509 Heart failure, unspecified: Secondary | ICD-10-CM | POA: Insufficient documentation

## 2023-02-26 DIAGNOSIS — R0789 Other chest pain: Secondary | ICD-10-CM | POA: Insufficient documentation

## 2023-02-26 DIAGNOSIS — Z79899 Other long term (current) drug therapy: Secondary | ICD-10-CM | POA: Diagnosis not present

## 2023-02-26 DIAGNOSIS — R0602 Shortness of breath: Secondary | ICD-10-CM | POA: Diagnosis not present

## 2023-02-26 DIAGNOSIS — I110 Hypertensive heart disease with heart failure: Secondary | ICD-10-CM | POA: Insufficient documentation

## 2023-02-26 DIAGNOSIS — Z7982 Long term (current) use of aspirin: Secondary | ICD-10-CM | POA: Diagnosis not present

## 2023-02-26 DIAGNOSIS — I2510 Atherosclerotic heart disease of native coronary artery without angina pectoris: Secondary | ICD-10-CM | POA: Diagnosis not present

## 2023-02-26 LAB — CBC WITH DIFFERENTIAL/PLATELET
Abs Immature Granulocytes: 0.02 10*3/uL (ref 0.00–0.07)
Basophils Absolute: 0 10*3/uL (ref 0.0–0.1)
Basophils Relative: 1 %
Eosinophils Absolute: 0.1 10*3/uL (ref 0.0–0.5)
Eosinophils Relative: 1 %
HCT: 42 % (ref 39.0–52.0)
Hemoglobin: 15.5 g/dL (ref 13.0–17.0)
Immature Granulocytes: 0 %
Lymphocytes Relative: 16 %
Lymphs Abs: 0.8 10*3/uL (ref 0.7–4.0)
MCH: 31.6 pg (ref 26.0–34.0)
MCHC: 36.9 g/dL — ABNORMAL HIGH (ref 30.0–36.0)
MCV: 85.5 fL (ref 80.0–100.0)
Monocytes Absolute: 0.3 10*3/uL (ref 0.1–1.0)
Monocytes Relative: 6 %
Neutro Abs: 3.9 10*3/uL (ref 1.7–7.7)
Neutrophils Relative %: 76 %
Platelets: 140 10*3/uL — ABNORMAL LOW (ref 150–400)
RBC: 4.91 MIL/uL (ref 4.22–5.81)
RDW: 13.8 % (ref 11.5–15.5)
WBC: 5.1 10*3/uL (ref 4.0–10.5)
nRBC: 0 % (ref 0.0–0.2)

## 2023-02-26 LAB — BASIC METABOLIC PANEL
Anion gap: 8 (ref 5–15)
BUN: 15 mg/dL (ref 8–23)
CO2: 26 mmol/L (ref 22–32)
Calcium: 8.6 mg/dL — ABNORMAL LOW (ref 8.9–10.3)
Chloride: 94 mmol/L — ABNORMAL LOW (ref 98–111)
Creatinine, Ser: 1.3 mg/dL — ABNORMAL HIGH (ref 0.61–1.24)
GFR, Estimated: >60 mL/min (ref >60)
Glucose, Bld: 428 mg/dL — ABNORMAL HIGH (ref 70–99)
Potassium: 4 mmol/L (ref 3.5–5.1)
Sodium: 128 mmol/L — ABNORMAL LOW (ref 135–145)

## 2023-02-26 LAB — I-STAT CHEM 8, ED
BUN: 14 mg/dL (ref 8–23)
Calcium, Ion: 1.16 mmol/L (ref 1.15–1.40)
Chloride: 93 mmol/L — ABNORMAL LOW (ref 98–111)
Creatinine, Ser: 1.3 mg/dL — ABNORMAL HIGH (ref 0.61–1.24)
Glucose, Bld: 422 mg/dL — ABNORMAL HIGH (ref 70–99)
HCT: 41 % (ref 39.0–52.0)
Hemoglobin: 13.9 g/dL (ref 13.0–17.0)
Potassium: 4.1 mmol/L (ref 3.5–5.1)
Sodium: 131 mmol/L — ABNORMAL LOW (ref 135–145)
TCO2: 25 mmol/L (ref 22–32)

## 2023-02-26 LAB — HEPATIC FUNCTION PANEL
ALT: 21 U/L (ref 0–44)
AST: 16 U/L (ref 15–41)
Albumin: 4.1 g/dL (ref 3.5–5.0)
Alkaline Phosphatase: 135 U/L — ABNORMAL HIGH (ref 38–126)
Bilirubin, Direct: 0.3 mg/dL — ABNORMAL HIGH (ref 0.0–0.2)
Indirect Bilirubin: 1.5 mg/dL — ABNORMAL HIGH (ref 0.3–0.9)
Total Bilirubin: 1.8 mg/dL — ABNORMAL HIGH (ref 0.3–1.2)
Total Protein: 6.6 g/dL (ref 6.5–8.1)

## 2023-02-26 LAB — MAGNESIUM: Magnesium: 1.8 mg/dL (ref 1.7–2.4)

## 2023-02-26 LAB — LIPASE, BLOOD: Lipase: 29 U/L (ref 11–51)

## 2023-02-26 LAB — BRAIN NATRIURETIC PEPTIDE: B Natriuretic Peptide: 39 pg/mL (ref 0.0–100.0)

## 2023-02-26 LAB — TROPONIN I (HIGH SENSITIVITY)
Troponin I (High Sensitivity): 3 ng/L (ref <18)
Troponin I (High Sensitivity): 3 ng/L (ref <18)

## 2023-02-26 LAB — CBG MONITORING, ED
Glucose-Capillary: 399 mg/dL — ABNORMAL HIGH (ref 70–99)
Glucose-Capillary: 248 mg/dL — ABNORMAL HIGH (ref 70–99)

## 2023-02-26 MED ORDER — LACTATED RINGERS IV BOLUS
1000.0000 mL | Freq: Once | INTRAVENOUS | Status: AC
  Administered 2023-02-26: 1000 mL via INTRAVENOUS

## 2023-02-26 MED ORDER — ASPIRIN 81 MG PO CHEW
162.0000 mg | CHEWABLE_TABLET | Freq: Once | ORAL | Status: AC
  Administered 2023-02-26: 162 mg via ORAL
  Filled 2023-02-26: qty 2

## 2023-02-26 MED ORDER — FENTANYL CITRATE PF 50 MCG/ML IJ SOSY
50.0000 ug | PREFILLED_SYRINGE | Freq: Once | INTRAMUSCULAR | Status: AC
  Administered 2023-02-26: 50 ug via INTRAVENOUS
  Filled 2023-02-26: qty 1

## 2023-02-26 MED ORDER — INSULIN ASPART 100 UNIT/ML IJ SOLN
8.0000 [IU] | Freq: Once | INTRAMUSCULAR | Status: AC
  Administered 2023-02-26: 8 [IU] via SUBCUTANEOUS
  Filled 2023-02-26: qty 1

## 2023-02-26 NOTE — ED Notes (Signed)
CBG 248  

## 2023-02-26 NOTE — ED Triage Notes (Signed)
Pt reports chest pressure that started 2 days ago. Pt reports "it makes it hard to breath." Reports the pain is worse when moving. Denies n/v.

## 2023-02-26 NOTE — ED Provider Notes (Signed)
[place] EMERGENCY DEPARTMENT AT [facility] Provider Note   CSN: [id number] Arrival date & time: 02/26/23  1028     History  Chief Complaint  Patient presents with   Chest Pain    Mike Floyd is a 62 y.o. male.   Chest Pain Associated symptoms: shortness of breath   Patient presents for chest pressure.  Medical history includes DM, gout, GERD, OSA, HTN, CAD, CHF, HLD, dilated aortic root, osteomyelitis.  Over the past 2 days, patient has been experiencing left-sided chest pressure.  He feels like it is difficult to catch his breath.  Current discomfort is 6/10 in severity.  He took 2 baby aspirin earlier today.      Home Medications Prior to Admission medications   Medication Sig Start Date End Date Taking? Authorizing Provider  amitriptyline (ELAVIL) 50 MG tablet Take 50 mg by mouth at bedtime. 12/04/13   [provider]  aspirin 81 MG tablet Take 81 mg by mouth daily.    [provider]  atorvastatin (LIPITOR) 20 MG tablet Take 20 mg by mouth daily.    [provider]  B-D ULTRAFINE III SHORT PEN 31G X 8 MM MISC USE WITH TRESBIA. 10/27/17   Roma Kayser, MD  calcium carbonate (OSCAL) 1500 (600 Ca) MG TABS tablet Take 600 mg of elemental calcium by mouth daily with breakfast.    [provider]  carvedilol (COREG) 12.5 MG tablet TAKE 1 TABLET BY MOUTH TWICE DAILY. 03/18/22   Wendall Stade, MD  famotidine (PEPCID) 20 MG tablet Take 20 mg by mouth daily.    [provider]  furosemide (LASIX) 40 MG tablet TAKE (1) TABLET BY MOUTH ONCE DAILY. Patient taking differently: Take 40 mg by mouth daily. 08/06/19   Laqueta Linden, MD  gabapentin (NEURONTIN) 600 MG tablet Take 300 mg by mouth 3 (three) times daily. 09/05/20   [provider]  isosorbide mononitrate (IMDUR) 30 MG 24 hr tablet TAKE (1) TABLET BY MOUTH ONCE DAILY. Patient taking differently: Take 30 mg by mouth daily. 01/28/20   Laqueta Linden, MD  LORazepam (ATIVAN) 1 MG tablet Take 1 mg by mouth at bedtime.    [provider]  nitroGLYCERIN (NITROSTAT) 0.4 MG SL tablet Place 0.4 mg under the tongue every 5 (five) minutes as needed for chest pain.    [provider]  NOVOLOG MIX 70/30 FLEXPEN (70-30) 100 UNIT/ML FlexPen Inject 50 Units into the skin 2 (two) times daily. 03/05/22   [provider]  ONETOUCH VERIO test strip TESTING 4 TIMES DAILY. 12/25/18   Roma Kayser, MD  oxycodone (ROXICODONE) 30 MG immediate release tablet Take 30-60 mg by mouth 4 (four) times daily as needed for pain. 12/16/20   [provider]  pantoprazole (PROTONIX) 40 MG tablet Take 40 mg by mouth 2 (two) times daily. 07/29/21   [provider]  potassium chloride SA (KLOR-CON M) 20 MEQ tablet TAKE (1) TABLET BY MOUTH ONCE DAILY. 02/09/23   Wendall Stade, MD  tamsulosin (FLOMAX) 0.4 MG CAPS capsule Take 0.4 mg by mouth daily. am    [provider]  vitamin B-12 (CYANOCOBALAMIN) 1000 MCG tablet Take 1,000 mcg by mouth daily.    [provider]      Allergies    Patient has no known allergies.    Review of Systems   Review of Systems  Respiratory:  Positive for shortness of breath.   Cardiovascular:  Positive  for chest pain.  All other systems reviewed and are negative.   Physical Exam Updated Vital Signs BP 117/77   Pulse 77   Temp 98.2 F (36.8 C) (Oral)   Resp 15   Ht 6\' 1"  (1.854 m)   Wt 93 kg   SpO2 97%   BMI 27.05 kg/m  Physical Exam Vitals and nursing note reviewed.  Constitutional:      General: He is not in acute distress.    Appearance: He is well-developed. He is not ill-appearing, toxic-appearing or diaphoretic.  HENT:     Head: Normocephalic and atraumatic.  Eyes:     Extraocular Movements: Extraocular movements intact.     Conjunctiva/sclera: Conjunctivae normal.  Cardiovascular:     Rate and Rhythm: Normal rate and regular rhythm.     Heart  sounds: No murmur heard. Pulmonary:     Effort: Pulmonary effort is normal. No respiratory distress.     Breath sounds: Normal breath sounds. No decreased breath sounds, wheezing, rhonchi or rales.  Chest:     Chest wall: Tenderness present.  Abdominal:     Palpations: Abdomen is soft.     Tenderness: There is no abdominal tenderness.  Musculoskeletal:        General: No swelling.     Cervical back: Normal range of motion and neck supple.     Right lower leg: No edema.     Left lower leg: No edema.  Skin:    General: Skin is warm and dry.     Capillary Refill: Capillary refill takes less than 2 seconds.     Coloration: Skin is not cyanotic or pale.  Neurological:     General: No focal deficit present.     Mental Status: He is alert and oriented to person, place, and time.  Psychiatric:        Mood and Affect: Mood normal.        Behavior: Behavior normal.     ED Results / Procedures / Treatments   Labs (all labs ordered are listed, but only abnormal results are displayed) Labs Reviewed  BASIC METABOLIC PANEL - Abnormal; Notable for the following components:      Result Value   Sodium 128 (*)    Chloride 94 (*)    Glucose, Bld 428 (*)    Creatinine, Ser 1.30 (*)    Calcium 8.6 (*)    All other components within normal limits  HEPATIC FUNCTION PANEL - Abnormal; Notable for the following components:   Alkaline Phosphatase 135 (*)    Total Bilirubin 1.8 (*)    Bilirubin, Direct 0.3 (*)    Indirect Bilirubin 1.5 (*)    All other components within normal limits  CBC WITH DIFFERENTIAL/PLATELET - Abnormal; Notable for the following components:   MCHC 36.9 (*)    Platelets 140 (*)    All other components within normal limits  CBG MONITORING, ED - Abnormal; Notable for the following components:   Glucose-Capillary 399 (*)    All other components within normal limits  MAGNESIUM  LIPASE, BLOOD  BRAIN NATRIURETIC PEPTIDE  I-STAT CHEM 8, ED  TROPONIN I (HIGH SENSITIVITY)   TROPONIN I (HIGH SENSITIVITY)    EKG EKG Interpretation Date/Time:  Saturday February 26 2023 10:42:21 EDT Ventricular Rate:  86 PR Interval:  172 QRS Duration:  99 QT Interval:  355 QTC Calculation: 425 R Axis:   63  Text Interpretation: Sinus rhythm Abnormal R-wave progression, early transition Confirmed by Durwin Nora, Amaziah Ghosh ([id number]) on  02/26/2023 12:39:44 PM  Radiology DG Chest Portable 1 View  Result Date: 02/26/2023 CLINICAL DATA:  Chest pressure for 2 days.  Difficulty breathing. EXAM: PORTABLE CHEST 1 VIEW COMPARISON:  Radiographs 05/13/2022 and 12/29/2017.  CT 01/31/2019. FINDINGS: 1055 hours. The heart size and mediastinal contours are normal. The lungs are clear. There is no pleural effusion or pneumothorax. No acute osseous findings are identified. Telemetry leads overlie the chest. IMPRESSION: No evidence of active cardiopulmonary process. Electronically Signed   By: Carey Bullocks M.D.   On: 02/26/2023 11:26    Procedures Procedures    Medications Ordered in ED Medications  insulin aspart (novoLOG) injection 8 Units (has no administration in time range)  lactated ringers bolus 1,000 mL (has no administration in time range)  aspirin chewable tablet 162 mg (162 mg Oral Given 02/26/23 1116)  fentaNYL (SUBLIMAZE) injection 50 mcg (50 mcg Intravenous Given 02/26/23 1117)    ED Course/ Medical Decision Making/ A&P                             Medical Decision Making Amount and/or Complexity of Data Reviewed Labs: ordered. Radiology: ordered.  Risk OTC drugs. Prescription drug management.   This patient presents to the ED for concern of chest pressure, this involves an extensive number of treatment options, and is a complaint that carries with it a high risk of complications and morbidity.  The differential diagnosis includes ACS, reactive airway disease, GERD, musculoskeletal etiology   Co morbidities that complicate the patient evaluation  DM, gout, GERD, OSA, HTN, CAD,  CHF, HLD, dilated aortic root, osteomyelitis   Additional history obtained:  Additional history obtained from N/A External records from outside source obtained and reviewed including EMR   Lab Tests:  I Ordered, and personally interpreted labs.  The pertinent results include: Hyperglycemia is present without evidence of DKA.  Creatinine appears to be baseline.  Hemoglobin and WBC are normal.  Troponins are normal x 2.  BNP is normal.   Imaging Studies ordered:  I ordered imaging studies including chest x-ray I independently visualized and interpreted imaging which showed no acute findings I agree with the radiologist interpretation   Cardiac Monitoring: / EKG:  The patient was maintained on a cardiac monitor.  I personally viewed and interpreted the cardiac monitored which showed an underlying rhythm of: Sinus rhythm   Consultations Obtained:  I requested consultation with the cardiologist, Dr. Royann Shivers,  and discussed lab and imaging findings as well as pertinent plan - they recommend: Outpatient follow-up   Problem List / ED Course / Critical interventions / Medication management  Patient with history of CAD presenting for 2 days of left-sided chest pressure and shortness of breath.  On exam, patient is well-appearing.  Current severity of discomfort is 6/10.  He is normotensive.  EKG does not show any evidence of STEMI.  Patient took 162 mg of ASA prior to arrival.  An additional 162 mg was ordered.  Fentanyl was ordered for analgesia.  Workup was initiated.  Patient's lab was notable for hyperglycemia.  IV fluids and insulin were ordered.  Lab work is otherwise unremarkable.  Notably, troponin and BNP are normal.  On reassessment, patient has resolution of all symptoms.  Given his risk factors, I did speak with cardiologist on-call, Dr. Royann Shivers, who does recommend outpatient follow-up.  Patient is in favor of this plan.  Cardiology referral was ordered.  Patient was discharged  in stable condition.  I ordered medication including IV fluids and insulin for hyperglycemia; fentanyl for chest pressure Reevaluation of the patient after these medicines showed that the patient resolved I have reviewed the patients home medicines and have made adjustments as needed   Social Determinants of Health:  Has PCP        Final Clinical Impression(s) / ED Diagnoses Final diagnoses:  Left chest pressure    Rx / DC Orders ED Discharge Orders          Ordered    Ambulatory referral to Cardiology  Status:  Canceled       Comments: If you have not heard from the Cardiology office within the next 72 hours please call [phone].   02/26/23 [id number]              Gloris Manchester, MD 02/26/23 1523

## 2023-02-26 NOTE — Discharge Instructions (Addendum)
A cardiology follow-up appointment is scheduled, see below for details.    Today, your blood glucose was elevated.  Follow-up with your primary doctor for ongoing management of your diabetes.    Return to the emergency department at anytime for any new or worsening symptoms of concern.

## 2023-03-01 ENCOUNTER — Telehealth: Payer: Self-pay | Admitting: *Deleted

## 2023-03-01 NOTE — Telephone Encounter (Signed)
Transition Care Management Unsuccessful Follow-up Telephone Call  Date of discharge and from where:  Mike Floyd  02/26/2023  Attempts:  1st Attempt  Reason for unsuccessful TCM follow-up call:  No answer/busy

## 2023-03-02 ENCOUNTER — Telehealth: Payer: Self-pay | Admitting: *Deleted

## 2023-03-02 ENCOUNTER — Ambulatory Visit (HOSPITAL_COMMUNITY)
Admission: RE | Admit: 2023-03-02 | Discharge: 2023-03-02 | Disposition: A | Discharge status: Home / Self Care | Payer: Medicare Other | Source: Ambulatory Visit | Attending: Internal Medicine | Admitting: Internal Medicine

## 2023-03-02 ENCOUNTER — Other Ambulatory Visit (HOSPITAL_COMMUNITY): Payer: Self-pay | Admitting: Internal Medicine

## 2023-03-02 DIAGNOSIS — R0781 Pleurodynia: Secondary | ICD-10-CM | POA: Diagnosis not present

## 2023-03-02 DIAGNOSIS — I43 Cardiomyopathy in diseases classified elsewhere: Secondary | ICD-10-CM | POA: Diagnosis not present

## 2023-03-02 DIAGNOSIS — G894 Chronic pain syndrome: Secondary | ICD-10-CM | POA: Diagnosis not present

## 2023-03-02 DIAGNOSIS — E11319 Type 2 diabetes mellitus with unspecified diabetic retinopathy without macular edema: Secondary | ICD-10-CM | POA: Diagnosis not present

## 2023-03-02 DIAGNOSIS — E1140 Type 2 diabetes mellitus with diabetic neuropathy, unspecified: Secondary | ICD-10-CM | POA: Diagnosis not present

## 2023-03-02 DIAGNOSIS — M1991 Primary osteoarthritis, unspecified site: Secondary | ICD-10-CM | POA: Diagnosis not present

## 2023-03-02 DIAGNOSIS — I200 Unstable angina: Secondary | ICD-10-CM | POA: Diagnosis not present

## 2023-03-02 DIAGNOSIS — I77810 Thoracic aortic ectasia: Secondary | ICD-10-CM | POA: Diagnosis not present

## 2023-03-02 DIAGNOSIS — E1165 Type 2 diabetes mellitus with hyperglycemia: Secondary | ICD-10-CM | POA: Diagnosis not present

## 2023-03-02 DIAGNOSIS — E1129 Type 2 diabetes mellitus with other diabetic kidney complication: Secondary | ICD-10-CM | POA: Diagnosis not present

## 2023-03-02 DIAGNOSIS — N1831 Chronic kidney disease, stage 3a: Secondary | ICD-10-CM | POA: Diagnosis not present

## 2023-03-02 NOTE — Telephone Encounter (Signed)
Transition Care Management Unsuccessful Follow-up Telephone Call  Date of discharge and from where:  [facility] 02/26/2023  Attempts:  2nd Attempt  Reason for unsuccessful TCM follow-up call:  No answer/busy

## 2023-03-11 ENCOUNTER — Ambulatory Visit: Payer: Medicare Other | Attending: Student | Admitting: Student

## 2023-03-11 ENCOUNTER — Encounter: Payer: Self-pay | Admitting: Student

## 2023-03-11 VITALS — BP 110/58 | HR 88 | Ht 73.0 in | Wt 213.0 lb

## 2023-03-11 DIAGNOSIS — R079 Chest pain, unspecified: Secondary | ICD-10-CM

## 2023-03-11 DIAGNOSIS — R0609 Other forms of dyspnea: Secondary | ICD-10-CM

## 2023-03-11 DIAGNOSIS — I25118 Atherosclerotic heart disease of native coronary artery with other forms of angina pectoris: Secondary | ICD-10-CM

## 2023-03-11 DIAGNOSIS — N182 Chronic kidney disease, stage 2 (mild): Secondary | ICD-10-CM

## 2023-03-11 DIAGNOSIS — E785 Hyperlipidemia, unspecified: Secondary | ICD-10-CM

## 2023-03-11 DIAGNOSIS — I5032 Chronic diastolic (congestive) heart failure: Secondary | ICD-10-CM

## 2023-03-11 DIAGNOSIS — R0602 Shortness of breath: Secondary | ICD-10-CM

## 2023-03-11 MED ORDER — NITROGLYCERIN 0.4 MG SL SUBL: 0.4000 mg | SUBLINGUAL_TABLET | SUBLINGUAL | 3 refills | Status: DC | PRN

## 2023-03-11 MED ORDER — ISOSORBIDE MONONITRATE ER 60 MG PO TB24: 60.0000 mg | ORAL_TABLET | Freq: Every day | ORAL | 3 refills | Status: DC

## 2023-03-11 MED ORDER — METOPROLOL TARTRATE 100 MG PO TABS: ORAL_TABLET | ORAL | 0 refills | Status: DC

## 2023-03-11 NOTE — Progress Notes (Unsigned)
Cardiology Office Note    Date:  03/12/2023  ID:  RICKARD KENNERLY, DOB [birth year], MRN [id number] Cardiologist: Charlton Haws, MD    History of Present Illness:    Mike Floyd is a 62 y.o. male with past medical history of CAD (prior Coronary CT in 01/2019 showed mild disease along mid-LAD and proximal LCx, low-risk NST in 10/2020), HFimpEF, history of ICD (placed in 2009 but removed in 2018), WPW (s/p RFA in 1999), HTN, HLD, Stage 2 CKD and prior CVA who presents to the office today for follow-up from a recent Emergency Department visit.  He was last examined by Dr. Eden Emms in 02/2021 and had recently undergone wedge resection of the first right metatarsal for diabetic foot ulcer but denied any recent cardiac issues. No changes were made to his cardiac medications at that time and he was encouraged to follow-up in 1 year.  He did present to [facility] ED on 02/26/2023 for evaluation of chest pain over the past 2 days. Labs showed WBC 5.1, Hgb 15.5, platelets 140, Na+ 128, K+ 4.0 and creatinine 1.30. BNP was normal at 39 and initial and repeat troponin values were negative at 3. CXR showed no acute cardiopulmonary abnormalities. EKG showed normal sinus rhythm, heart rate 86 with prominent T waves but no acute ST changes. He was discharged home and informed to follow-up with cardiology as an outpatient.  In talking with the patient and his wife today, he reports having intermittent episodes of chest pain over the past few months. He says this can last for hours at a time and his pain is typically worse with deep breathing and at rest. Says symptoms typically improve with walking.  No association with lying down or food consumption. He reports associated dyspnea on exertion. No specific orthopnea, PND or pitting edema.  No recent trips, illnesses or sick contacts.   Studies Reviewed:   EKG: EKG is not ordered today. EKG from 02/26/2023 is reviewed and demonstrates normal sinus rhythm, heart rate 86  with prominent T waves but no acute ST changes.    Coronary CT: 01/2019 IMPRESSION: 1. Mild CAD, CADRADS = 2. Mild CAD noted in the mid LAD and proximal circumflex artery.   2. The patient's coronary artery calcium score is 45, which places the patient in the 58 percentile, which is average when compared to age and sex matched peers.   3. Normal coronary origin with right dominance.   4. Sinus of Valsalva dilation R-L: 45 mm, L-Non: 42 mm, R-Non: 43 mm measured double oblique. Normal caliber mid ascending aorta.   5. Mild dilation of the main pulmonary artery, 33 mm. This finding could suggest pulmonary hypertension.  NST: 10/2020 No diagnostic ST segment changes indicate ischemia. Small, mild to moderate intensity, apical to basal inferoseptal defect that is fixed and consistent with scar, no significant ischemia. This is a low risk study. Nuclear stress EF: 57%.  cMRI: 11/2020 IMPRESSION: 1.  Normal LV size with mild diffuse hypokinesis, EF 52%.   2.  Normal RV size and systolic function, EF 52%.   3. No myocardial LGE, so no definitive evidence for prior MI, infiltrative disease, or myocarditis.   Physical Exam:   VS:  BP (!) 110/58   Pulse 88   Ht 6\' 1"  (1.854 m)   Wt 213 lb (96.6 kg)   SpO2 96%   BMI 28.10 kg/m    Wt Readings from Last 3 Encounters:  03/11/23 213 lb (96.6 kg)  02/26/23  205 lb (93 kg)  05/13/22 213 lb (96.6 kg)     GEN: Well nourished, well developed male appearing in no acute distress NECK: No JVD; No carotid bruits CARDIAC: RRR, no murmurs, rubs, gallops RESPIRATORY:  Clear to auscultation without rales, wheezing or rhonchi  ABDOMEN: Appears non-distended. No obvious abdominal masses. EXTREMITIES: No clubbing or cyanosis. No pitting edema.  Distal pedal pulses are 2+ bilaterally.   Assessment and Plan:   1. Chest Pain/Dyspnea on Exertion - Unclear etiology and his description of symptoms seems atypical for angina as his pain has a  pleuritic component and can last for hours at a time. Labs during his recent Emergency Dept evaluation were reassuring as discussed above. - Will obtain additional labs today including ESR, CRP and D-dimer. Suspicion for a PE is low given this has been occurring for several months and O2 saturations are at 96% today. If labs are unrevealing, would plan to proceed with a Coronary CTA given his known CAD and cardiac risk factors as he is a diabetic and could have atypical angina.  - Continue ASA 81mg  daily, Atorvastatin 20mg  daily and Coreg 12.5mg  BID. Will adjust Imdur to 60mg  daily to see if this helps with his symptoms.   2. CAD - Prior Coronary CT in 01/2019 showed mild disease along the mid-LAD and proximal LCx and he did have a low-risk NST in 10/2020. Will plan for repeat ischemic evaluation as discussed above.  - Continue ASA, statin and beta-blocker therapy.   3. HFimpEF - Echo in 05/2022 showed his EF had improved to 50-55%. Continue Coreg 12.5mg  BID and Lasix 40mg  daily.   4. HLD - Followed by his PCP. Continue Atorvastatin 20mg  daily.   5. Stage 2 CKD/Hyponatremia - Recent labs showed his Na+ was low at 128 and creatinine was at 1.30. Will obtain a repeat BMET for reassessment of his Na+ level.   Signed, Ellsworth Lennox, PA-C

## 2023-03-11 NOTE — Patient Instructions (Signed)
Medication Instructions:   INCREASE Imdur to 60 mg daily  The Morning of your Cardiac CT, HOLD the Coreg,  take Lopressor 100 mg 2 hours before your CT  Labwork: Bmet,d-dimer,esr,crp at [facility]  Testing/Procedures: Your physician has requested that you have cardiac CT. Cardiac computed tomography (CT) is a painless test that uses an x-ray machine to take clear, detailed pictures of your heart. For further information please visit [email or web address] Please follow instruction sheet as given.    Follow-Up: 6-8 weeks  Any Other Special Instructions Will Be Listed Below (If Applicable).  If you need a refill on your cardiac medications before your next appointment, please call your pharmacy.

## 2023-03-12 ENCOUNTER — Encounter: Payer: Self-pay | Admitting: Student

## 2023-03-14 DIAGNOSIS — R0602 Shortness of breath: Secondary | ICD-10-CM | POA: Diagnosis not present

## 2023-03-14 DIAGNOSIS — R079 Chest pain, unspecified: Secondary | ICD-10-CM | POA: Diagnosis not present

## 2023-03-16 ENCOUNTER — Telehealth: Payer: Self-pay | Admitting: *Deleted

## 2023-03-16 MED ORDER — FUROSEMIDE 20 MG PO TABS: 20.0000 mg | ORAL_TABLET | Freq: Every day | ORAL | 3 refills | Status: DC

## 2023-03-16 NOTE — Telephone Encounter (Signed)
Pt notified of test results and order placed.

## 2023-03-16 NOTE — Telephone Encounter (Signed)
-----   Message from [place] sent at 03/15/2023  7:52 PM EDT ----- Please let the patient know his inflammatory markers are normal. D-dimer negative which means a blood clot to the lungs is very unlikely. Potassium is normal and sodium levels have normalized as well. Kidney function slightly above his normal values so please make sure he is consuming approximately 2 Liters of fluid daily. If no recent issues with fluid retention, would recommend reducing Lasix from 40 mg daily to 20mg  daily. Please make sure Coronary CT is scheduled as discussed during his office visit and hold Lasix the day of the test.

## 2023-04-06 ENCOUNTER — Ambulatory Visit: Payer: Medicare Other | Attending: Student

## 2023-04-06 ENCOUNTER — Telehealth: Payer: Self-pay | Admitting: Internal Medicine

## 2023-04-06 DIAGNOSIS — R0600 Dyspnea, unspecified: Secondary | ICD-10-CM | POA: Diagnosis not present

## 2023-04-06 DIAGNOSIS — E1165 Type 2 diabetes mellitus with hyperglycemia: Secondary | ICD-10-CM | POA: Diagnosis not present

## 2023-04-06 DIAGNOSIS — G894 Chronic pain syndrome: Secondary | ICD-10-CM | POA: Diagnosis not present

## 2023-04-06 DIAGNOSIS — I43 Cardiomyopathy in diseases classified elsewhere: Secondary | ICD-10-CM | POA: Diagnosis not present

## 2023-04-06 DIAGNOSIS — E1129 Type 2 diabetes mellitus with other diabetic kidney complication: Secondary | ICD-10-CM | POA: Diagnosis not present

## 2023-04-06 DIAGNOSIS — R002 Palpitations: Secondary | ICD-10-CM

## 2023-04-06 DIAGNOSIS — N1831 Chronic kidney disease, stage 3a: Secondary | ICD-10-CM | POA: Diagnosis not present

## 2023-04-06 DIAGNOSIS — E1140 Type 2 diabetes mellitus with diabetic neuropathy, unspecified: Secondary | ICD-10-CM | POA: Diagnosis not present

## 2023-04-06 DIAGNOSIS — I77810 Thoracic aortic ectasia: Secondary | ICD-10-CM | POA: Diagnosis not present

## 2023-04-06 NOTE — Telephone Encounter (Signed)
   Can arrange for a 2-week Zio patch for palpitations. Also, he was supposed to have a Coronary CT following his last office visit but I cannot see where this has yet been scheduled. Please follow-up to make sure this is arranged prior to his next visit.  Signed, Ellsworth Lennox, PA-C 04/06/2023, 4:25 PM Pager: [phone]

## 2023-04-06 NOTE — Telephone Encounter (Signed)
Order entered, will have monitor mailed to pt, attempt to reach pt,no answer,no voicemail

## 2023-04-06 NOTE — Telephone Encounter (Signed)
Office calling trying to get an order placed for a heart monitor for the pt. Please advise

## 2023-04-06 NOTE — Telephone Encounter (Signed)
Spoke with nurse at Dr. Sharyon Medicus office who states that Dr. Sherwood Gambler is asking that we order a heart monitor for this pt. She reports that the pt is having SOB and palpitations over the last two weeks. If unable to order he is requesting that the MD give him a call. Pt was seen today by Dr. Sherwood Gambler, office note requested. Please advise.

## 2023-04-07 NOTE — Telephone Encounter (Signed)
Called pt to notify of heart monitor and the need to schedule Coronary CTA. Pt voiced understanding.

## 2023-04-14 DIAGNOSIS — R002 Palpitations: Secondary | ICD-10-CM

## 2023-04-15 ENCOUNTER — Encounter (HOSPITAL_COMMUNITY): Payer: Self-pay

## 2023-04-18 ENCOUNTER — Ambulatory Visit: Payer: Medicare Other | Admitting: Orthopedic Surgery

## 2023-04-18 ENCOUNTER — Encounter: Payer: Self-pay | Admitting: Orthopedic Surgery

## 2023-04-18 DIAGNOSIS — Z89432 Acquired absence of left foot: Secondary | ICD-10-CM

## 2023-04-18 DIAGNOSIS — L97521 Non-pressure chronic ulcer of other part of left foot limited to breakdown of skin: Secondary | ICD-10-CM

## 2023-04-18 NOTE — Progress Notes (Signed)
Office Visit Note   Patient: Mike Floyd           Date of Birth: [birth year]           MRN: [id number] Visit Date: 04/18/2023              Requested by: Elfredia Nevins, MD [address] [place],  [place] [address] PCP: Elfredia Nevins, MD  Chief Complaint  Patient presents with   Left Foot - Pain    Hx left transmet amputation      HPI: Patient is a 62 year old gentleman who is status post left transmetatarsal amputation.  Patient states he has had a new ulcer across the midfoot.  He has custom extra-depth shoes with a carbon plate and custom orthotics.  Assessment & Plan: Visit Diagnoses:  1. History of transmetatarsal amputation of left foot (HCC)   2. Acute osteomyelitis of left foot (HCC)     Plan: Ulcer was debrided and the orthotic was modified to offload pressure.  Follow-Up Instructions: No follow-ups on file.   Ortho Exam  Patient is alert, oriented, no adenopathy, well-dressed, normal affect, normal respiratory effort. Examination patient has a Wagner grade 1 ulcer over the residual of the transmetatarsal amputation.  There is no cellulitis no drainage.  After informed consent a 10 blade knife was used to debride the skin and soft tissue back to healthy viable tissue.  After debridement the ulcer was 2 x 3 cm and 1 mm deep.  This did not probe to bone or tendon.  Imaging: No results found. No images are attached to the encounter.  Labs: Lab Results  Component Value Date   HGBA1C 9.7 (H) 05/13/2022   HGBA1C 7.0 (H) 06/15/2017   HGBA1C 5.2 03/15/2013   ESRSEDRATE 2 03/14/2023   CRP <1 03/14/2023   CRP 0.6 05/16/2022   CRP 2.2 06/27/2019   LABURIC 5.5 03/07/2012   REPTSTATUS 05/18/2022 FINAL 05/13/2022   CULT  05/13/2022    NO GROWTH 5 DAYS Performed at [facility], [address] [place], [place] [address]      Lab Results  Component Value Date   ALBUMIN 4.1 02/26/2023   ALBUMIN 4.2 05/13/2022   ALBUMIN 4.6 12/27/2013    Lab Results   Component Value Date   MG 1.8 02/26/2023   MG 1.7 05/20/2022   MG 2.0 05/19/2022   Lab Results  Component Value Date   VD25OH 18 (L) 06/15/2017    No results found for: "PREALBUMIN"    Latest Ref Rng & Units 02/26/2023   11:08 AM 02/26/2023   11:03 AM 05/20/2022    5:45 AM  CBC EXTENDED  WBC 4.0 - 10.5 K/uL  5.1  6.1   RBC 4.22 - 5.81 MIL/uL  4.91  4.62   Hemoglobin 13.0 - 17.0 g/dL 86.5  78.4  69.6   HCT 39.0 - 52.0 % 41.0  42.0  38.7   Platelets 150 - 400 K/uL  140  155   NEUT# 1.7 - 7.7 K/uL  3.9  4.3   Lymph# 0.7 - 4.0 K/uL  0.8  1.0      There is no height or weight on file to calculate BMI.  Orders:  No orders of the defined types were placed in this encounter.  No orders of the defined types were placed in this encounter.    Procedures: No procedures performed  Clinical Data: No additional findings.  ROS:  All other systems negative, except as noted in the HPI.  Review of Systems  Objective: Vital Signs: There were no vitals taken for this visit.  Specialty Comments:  No specialty comments available.  PMFS History: Patient Active Problem List   Diagnosis Date Noted   Acute osteomyelitis of metatarsal bone of left foot (HCC)    Subacute osteomyelitis, left ankle and foot (HCC) 05/13/2022   Non-pressure chronic ulcer of other part of right foot limited to breakdown of skin (HCC)    Contracture of right Achilles tendon    Mildly dilatd aortic root (HCC)    Contracture of left Achilles tendon    Metatarsal deformity, left    CAD (coronary artery disease)    Hallux hammertoe, left 12/11/2019   Diarrhea 06/27/2019   Diastasis recti 08/22/2018   Umbilical hernia without obstruction and without gangrene 08/22/2018   Vitamin D deficiency 06/23/2017   Essential hypertension, benign 06/15/2017   Mixed hyperlipidemia 06/15/2017   Class 1 obesity due to excess calories with serious comorbidity and body mass index (BMI) of 31.0 to 31.9 in adult 06/15/2017    Malfunction of implantable cardioverter-defibrillator (ICD) electrode 10/07/2016   Chronic kidney disease (CKD), stage III (moderate) (HCC) 04/04/2015   Bladder neck obstruction 03/21/2015   Difficult or painful urination 03/21/2015   Flank pain 03/21/2015   Delayed onset of urination 03/21/2015   Calculus of kidney 03/21/2015   Erosive esophagitis 12/27/2013   Hypotension due to drugs 07/02/2013   Chronic systolic heart failure (HCC) 04/24/2013   CAP (community acquired pneumonia) 03/16/2013   HTN (hypertension) 03/15/2013   Hydrocele 03/15/2013   Spermatocele 03/15/2013   DOE (dyspnea on exertion) 03/15/2013   OSA (obstructive sleep apnea) 01/09/2013   Chronic kidney disease, stage 3 (HCC) 01/03/2013   Chest pain 12/26/2012   History of diagnostic tests 09/06/2012   TIA (transient ischemic attack)    Cardiomyopathy, nonischemic (HCC) 02/24/2010   AICD (automatic cardioverter/defibrillator) 02/24/2010   DM type 2 causing vascular disease (HCC) 06/27/2009   Gout 06/27/2009   Tobacco abuse, in remission 06/27/2009   COLONIC POLYPS 10/31/2008   GERD (gastroesophageal reflux disease) 10/31/2008   Past Medical History:  Diagnosis Date   AICD (automatic cardioverter/defibrillator) present 11/2007   a. 11/2007 [facility] Current VR - single lead ICD  - Removed 2018 - "it was burning me"   Anxiety    CAD (coronary artery disease)    non-obstructive CAD by Cor CT in 2020 // Myoview 3/22: EF 57, small inf-sept defect c/w scar, no ischemia; low risk      Chest pain    a. 10/2007 Cath:  normal Cors.   CKD (chronic kidney disease), stage II    DDD (degenerative disc disease), lumbar    Diabetes mellitus DX: 2010   Erosive esophagitis    a. per EGD (08/2011), Dr. Karilyn Cota - Erosive reflux esophagitis improved but not completely healed since previous EGD 3 years ago. Bx showing  ulcerated gatroesophageal junction mucosa. negative for H. pylori   GERD (gastroesophageal reflux disease)    Gout     Hearing deficit    a. wear bilateral hearing aides   History of hiatal hernia    Hypertension    Mildly dilatd aortic root (HCC)    CMR 4/22: EF 52, no LGE; d/w Dr. Lorretta Harp root 38 mm (mildly dilated)   Myocardial infarction [facility]) 2011   Neuropathy    Feet and legs   Nonischemic dilated cardiomyopathy (HCC)    a. H/O EF as low as 35-40% by LV gram 10/2007;  b.  Echo 02/2011 EF 50-55%, inf HK, Gr 1 DD // CMR 4/22: EF 52, no LGE; d/w Dr. Lorretta Harp root 38 mm (mildly dilated)    Renal insufficiency    Sleep apnea    pt doesnt use, states"I cant afford one". PCP aware   Stroke (HCC)    mini-stroke in 2014   TIA (transient ischemic attack)    July, 2013   Tobacco abuse, in remission 06/27/2009   Discontinued in 2009     Wears dentures    top plate   WPW (Wolff-Parkinson-White syndrome)    a. s/p RFCA @ [facility] - 1999    Family History  Problem Relation Age of Onset   Heart attack Mother 61   Hypertension Mother    Diabetes Mother    Kidney disease Mother    Breast cancer Mother    Heart attack Father 30   Hypertension Father    Diabetes Father    Stroke Brother    Heart attack Brother 75   Stroke Maternal Grandmother 39   Diabetes Brother    Hypertension Brother     Past Surgical History:  Procedure Laterality Date   AMPUTATION Left 05/19/2022   Procedure: TRANSMETATARSAL AMPUTATION LEFT FOOT;  Surgeon: Nadara Mustard, MD;  Location: [facility] OR;  Service: Orthopedics;  Laterality: Left;   APPENDECTOMY     BACK SURGERY     1995   CARDIAC CATHETERIZATION     2009   CARDIAC DEFIBRILLATOR PLACEMENT     CHOLECYSTECTOMY     CHOLECYSTECTOMY, LAPAROSCOPIC     11/2007   COLONOSCOPY W/ POLYPECTOMY  2009   ELBOW SURGERY Left 06/2010   ESOPHAGEAL DILATION N/A 11/08/2014   Procedure: ESOPHAGEAL DILATION;  Surgeon: Malissa Hippo, MD;  Location: [facility] ORS;  Service: Endoscopy;  Laterality: N/A;  #56,    ESOPHAGOGASTRODUODENOSCOPY  03/31/2012   also 08/2011; [name]    ESOPHAGOGASTRODUODENOSCOPY (EGD) WITH PROPOFOL N/A 11/08/2014   Procedure: ESOPHAGOGASTRODUODENOSCOPY (EGD) WITH PROPOFOL;  Surgeon: Malissa Hippo, MD;  Location: [facility];  Service: Endoscopy;  Laterality: N/A;  [place] is 38 , [place] is 37   FOOT ARTHRODESIS Left 10/24/2020   Procedure: LEFT GASTROCNEMIUS RECESSION, DORSIFLEXION OSTEOTOMY 1ST MT;  Surgeon: Nadara Mustard, MD;  Location: [facility] OR;  Service: Orthopedics;  Laterality: Left;   FOOT ARTHRODESIS Right 01/09/2021   Procedure: CLOSING WEDGE OSTEOTOMY RIGHT 1ST METATARSAL;  Surgeon: Nadara Mustard, MD;  Location: [facility] OR;  Service: Orthopedics;  Laterality: Right;   GASTROCNEMIUS RECESSION Right 01/09/2021   Procedure: RIGHT GASTROCNEMIUS RECESSION;  Surgeon: Nadara Mustard, MD;  Location: [facility] OR;  Service: Orthopedics;  Laterality: Right;   ICD LEAD REMOVAL N/A 10/07/2016   Procedure: ICD LEAD REMOVAL ;  Surgeon: Marinus Maw, MD;  Location: [facility] OR;  Service: Cardiovascular;  Laterality: N/A;   MULTIPLE TOOTH EXTRACTIONS     RADIOFREQUENCY ABLATION  1999   WPW; performed at [facility]   TEE WITHOUT CARDIOVERSION N/A 10/07/2016   Procedure: TRANSESOPHAGEAL ECHOCARDIOGRAM (TEE);  Surgeon: Marinus Maw, MD;  Location[facility] OR;  Service: Cardiovascular;  Laterality: N/A;   Social History   Occupational History   Occupation: Worked for [employer]    Employer: [employer]    Comment: Laid off in 11/11  Tobacco Use   Smoking status: Former    Current packs/day: 0.00    Average packs/day: 1.5 packs/day for 39.1 years (58.6 ttl pk-yrs)    Types: Cigarettes    Start date: 06/30/1971  Quit date: 07/30/2010    Years since quitting: 12.7   Smokeless tobacco: Former    Types: Chew    Quit date: 03/26/1994  Vaping Use   Vaping status: Former   Start date: 07/30/2010   Quit date: 06/29/2016  Substance and Sexual Activity   Alcohol use: No    Alcohol/week: 1.0 standard drink of alcohol    Types: 1 Cans of beer per week    Comment: used "years  ago"   Drug use: No   Sexual activity: Never    Birth control/protection: None

## 2023-04-21 DIAGNOSIS — R002 Palpitations: Secondary | ICD-10-CM | POA: Diagnosis not present

## 2023-04-29 ENCOUNTER — Ambulatory Visit: Payer: Medicare Other | Admitting: Student

## 2023-04-29 ENCOUNTER — Telehealth (HOSPITAL_COMMUNITY): Payer: Self-pay | Admitting: *Deleted

## 2023-04-29 NOTE — Telephone Encounter (Signed)
Attempted to call patient regarding upcoming cardiac CT appointment. Spoke to wife-left message for patient to call back to discuss. Johney Frame RN Navigator Cardiac Imaging [facility] [phone] Office

## 2023-04-29 NOTE — Telephone Encounter (Signed)
Reaching out to patient to offer assistance regarding upcoming cardiac imaging study; pt verbalizes understanding of appt date/time, parking situation and where to check in, pre-test NPO status and medications ordered, and verified current allergies; name and call back number provided for further questions should they arise Hayley Sharpe RN Navigator Cardiac Imaging [facility] [phone] office [phone] cell  

## 2023-05-02 ENCOUNTER — Encounter (HOSPITAL_COMMUNITY): Payer: Self-pay

## 2023-05-02 ENCOUNTER — Ambulatory Visit (HOSPITAL_COMMUNITY)
Admission: RE | Admit: 2023-05-02 | Discharge: 2023-05-02 | Disposition: A | Discharge status: Home / Self Care | Payer: Medicare Other | Source: Ambulatory Visit | Attending: Student | Admitting: Student

## 2023-05-02 DIAGNOSIS — R0609 Other forms of dyspnea: Secondary | ICD-10-CM | POA: Diagnosis not present

## 2023-05-02 DIAGNOSIS — R079 Chest pain, unspecified: Secondary | ICD-10-CM | POA: Insufficient documentation

## 2023-05-02 MED ORDER — METOPROLOL TARTRATE 5 MG/5ML IV SOLN: 10.0000 mg | INTRAVENOUS | Status: DC | PRN

## 2023-05-02 MED ORDER — NITROGLYCERIN 0.4 MG SL SUBL: 0.8000 mg | SUBLINGUAL_TABLET | SUBLINGUAL | Status: DC | PRN

## 2023-05-02 MED ORDER — IOHEXOL 350 MG/ML SOLN
95.0000 mL | Freq: Once | INTRAVENOUS | Status: AC | PRN
  Administered 2023-05-02: 95 mL via INTRAVENOUS

## 2023-05-02 MED ORDER — METOPROLOL TARTRATE 5 MG/5ML IV SOLN
INTRAVENOUS | Status: AC
  Filled 2023-05-02: qty 10

## 2023-05-02 NOTE — Progress Notes (Signed)
Spoke with Dr. Rennis Golden regarding elevated HR (mid 70s- occasional low 80s) and BP 100/68; per Dr. Rennis Golden okay to proceed with scan with HR low-mid 70s and can administer 2 nitroglycerine tabs; okay to give 250 ml bolus pif pt becomes hypotensive after nitroglycerin administration

## 2023-05-02 NOTE — Progress Notes (Addendum)
BP 94/66, HR 80 prior to scan; Dr. Rennis Golden notified; per Dr. Rennis Golden, pt to be rescheduled for scan; pt and navigator team notified

## 2023-05-04 ENCOUNTER — Encounter (HOSPITAL_COMMUNITY): Payer: Self-pay | Admitting: *Deleted

## 2023-05-04 ENCOUNTER — Other Ambulatory Visit (HOSPITAL_COMMUNITY): Payer: Self-pay | Admitting: *Deleted

## 2023-05-04 ENCOUNTER — Emergency Department (HOSPITAL_COMMUNITY)
Admission: EM | Admit: 2023-05-04 | Discharge: 2023-05-04 | Disposition: A | Discharge status: Home / Self Care | Payer: Medicare Other | Attending: Emergency Medicine | Admitting: Emergency Medicine

## 2023-05-04 ENCOUNTER — Emergency Department (HOSPITAL_COMMUNITY): Payer: Medicare Other

## 2023-05-04 ENCOUNTER — Other Ambulatory Visit: Payer: Self-pay

## 2023-05-04 DIAGNOSIS — Z794 Long term (current) use of insulin: Secondary | ICD-10-CM | POA: Diagnosis not present

## 2023-05-04 DIAGNOSIS — R000 Tachycardia, unspecified: Secondary | ICD-10-CM | POA: Insufficient documentation

## 2023-05-04 DIAGNOSIS — R0602 Shortness of breath: Secondary | ICD-10-CM | POA: Insufficient documentation

## 2023-05-04 DIAGNOSIS — E1165 Type 2 diabetes mellitus with hyperglycemia: Secondary | ICD-10-CM | POA: Diagnosis not present

## 2023-05-04 DIAGNOSIS — N281 Cyst of kidney, acquired: Secondary | ICD-10-CM | POA: Diagnosis not present

## 2023-05-04 DIAGNOSIS — Z7982 Long term (current) use of aspirin: Secondary | ICD-10-CM | POA: Insufficient documentation

## 2023-05-04 DIAGNOSIS — Z95810 Presence of automatic (implantable) cardiac defibrillator: Secondary | ICD-10-CM | POA: Insufficient documentation

## 2023-05-04 DIAGNOSIS — R109 Unspecified abdominal pain: Secondary | ICD-10-CM | POA: Diagnosis not present

## 2023-05-04 DIAGNOSIS — N182 Chronic kidney disease, stage 2 (mild): Secondary | ICD-10-CM | POA: Insufficient documentation

## 2023-05-04 DIAGNOSIS — E1122 Type 2 diabetes mellitus with diabetic chronic kidney disease: Secondary | ICD-10-CM | POA: Diagnosis not present

## 2023-05-04 DIAGNOSIS — Z87891 Personal history of nicotine dependence: Secondary | ICD-10-CM | POA: Insufficient documentation

## 2023-05-04 DIAGNOSIS — R1013 Epigastric pain: Secondary | ICD-10-CM | POA: Insufficient documentation

## 2023-05-04 DIAGNOSIS — R0789 Other chest pain: Secondary | ICD-10-CM

## 2023-05-04 DIAGNOSIS — Z79899 Other long term (current) drug therapy: Secondary | ICD-10-CM | POA: Insufficient documentation

## 2023-05-04 DIAGNOSIS — I43 Cardiomyopathy in diseases classified elsewhere: Secondary | ICD-10-CM | POA: Diagnosis not present

## 2023-05-04 DIAGNOSIS — N183 Chronic kidney disease, stage 3 unspecified: Secondary | ICD-10-CM | POA: Insufficient documentation

## 2023-05-04 DIAGNOSIS — R072 Precordial pain: Secondary | ICD-10-CM | POA: Diagnosis not present

## 2023-05-04 DIAGNOSIS — R091 Pleurisy: Secondary | ICD-10-CM | POA: Diagnosis not present

## 2023-05-04 DIAGNOSIS — K5730 Diverticulosis of large intestine without perforation or abscess without bleeding: Secondary | ICD-10-CM | POA: Diagnosis not present

## 2023-05-04 DIAGNOSIS — I2510 Atherosclerotic heart disease of native coronary artery without angina pectoris: Secondary | ICD-10-CM | POA: Diagnosis not present

## 2023-05-04 DIAGNOSIS — R079 Chest pain, unspecified: Secondary | ICD-10-CM | POA: Diagnosis not present

## 2023-05-04 LAB — CBC WITH DIFFERENTIAL/PLATELET
Abs Immature Granulocytes: 0.02 10*3/uL (ref 0.00–0.07)
Basophils Absolute: 0 10*3/uL (ref 0.0–0.1)
Basophils Relative: 1 %
Eosinophils Absolute: 0.1 10*3/uL (ref 0.0–0.5)
Eosinophils Relative: 1 %
HCT: 41.3 % (ref 39.0–52.0)
Hemoglobin: 14.9 g/dL (ref 13.0–17.0)
Immature Granulocytes: 0 %
Lymphocytes Relative: 17 %
Lymphs Abs: 1 10*3/uL (ref 0.7–4.0)
MCH: 30.7 pg (ref 26.0–34.0)
MCHC: 36.1 g/dL — ABNORMAL HIGH (ref 30.0–36.0)
MCV: 85 fL (ref 80.0–100.0)
Monocytes Absolute: 0.5 10*3/uL (ref 0.1–1.0)
Monocytes Relative: 7 %
Neutro Abs: 4.5 10*3/uL (ref 1.7–7.7)
Neutrophils Relative %: 74 %
Platelets: 162 10*3/uL (ref 150–400)
RBC: 4.86 MIL/uL (ref 4.22–5.81)
RDW: 13.9 % (ref 11.5–15.5)
WBC: 6.1 10*3/uL (ref 4.0–10.5)
nRBC: 0 % (ref 0.0–0.2)

## 2023-05-04 LAB — COMPREHENSIVE METABOLIC PANEL
ALT: 21 U/L (ref 0–44)
AST: 18 U/L (ref 15–41)
Albumin: 4.4 g/dL (ref 3.5–5.0)
Alkaline Phosphatase: 123 U/L (ref 38–126)
Anion gap: 10 (ref 5–15)
BUN: 13 mg/dL (ref 8–23)
CO2: 25 mmol/L (ref 22–32)
Calcium: 9.5 mg/dL (ref 8.9–10.3)
Chloride: 97 mmol/L — ABNORMAL LOW (ref 98–111)
Creatinine, Ser: 1.14 mg/dL (ref 0.61–1.24)
GFR, Estimated: >60 mL/min (ref >60)
Glucose, Bld: 257 mg/dL — ABNORMAL HIGH (ref 70–99)
Potassium: 3.7 mmol/L (ref 3.5–5.1)
Sodium: 132 mmol/L — ABNORMAL LOW (ref 135–145)
Total Bilirubin: 2 mg/dL — ABNORMAL HIGH (ref 0.3–1.2)
Total Protein: 7 g/dL (ref 6.5–8.1)

## 2023-05-04 LAB — LIPASE, BLOOD: Lipase: 26 U/L (ref 11–51)

## 2023-05-04 LAB — TROPONIN I (HIGH SENSITIVITY)
Troponin I (High Sensitivity): 3 ng/L (ref <18)
Troponin I (High Sensitivity): 2 ng/L (ref <18)

## 2023-05-04 LAB — BRAIN NATRIURETIC PEPTIDE: B Natriuretic Peptide: 22 pg/mL (ref 0.0–100.0)

## 2023-05-04 MED ORDER — LIDOCAINE VISCOUS HCL 2 % MT SOLN
15.0000 mL | Freq: Once | OROMUCOSAL | Status: AC
  Administered 2023-05-04: 15 mL via ORAL
  Filled 2023-05-04: qty 15

## 2023-05-04 MED ORDER — CYCLOBENZAPRINE HCL 10 MG PO TABS: 10.0000 mg | ORAL_TABLET | Freq: Two times a day (BID) | ORAL | 0 refills | Status: AC | PRN

## 2023-05-04 MED ORDER — IVABRADINE HCL 7.5 MG PO TABS: ORAL_TABLET | ORAL | 0 refills | Status: DC

## 2023-05-04 MED ORDER — SODIUM CHLORIDE 0.9 % IV BOLUS
1000.0000 mL | Freq: Once | INTRAVENOUS | Status: AC
  Administered 2023-05-04: 1000 mL via INTRAVENOUS

## 2023-05-04 MED ORDER — KETOROLAC TROMETHAMINE 15 MG/ML IJ SOLN
15.0000 mg | Freq: Once | INTRAMUSCULAR | Status: AC
  Administered 2023-05-04: 15 mg via INTRAVENOUS
  Filled 2023-05-04: qty 1

## 2023-05-04 MED ORDER — SUCRALFATE 1 G PO TABS: 1.0000 g | ORAL_TABLET | Freq: Three times a day (TID) | ORAL | 0 refills | Status: DC

## 2023-05-04 MED ORDER — ALUM & MAG HYDROXIDE-SIMETH 200-200-20 MG/5ML PO SUSP
30.0000 mL | Freq: Once | ORAL | Status: AC
  Administered 2023-05-04: 30 mL via ORAL
  Filled 2023-05-04: qty 30

## 2023-05-04 MED ORDER — NAPROXEN SODIUM 220 MG PO TABS: 220.0000 mg | ORAL_TABLET | Freq: Two times a day (BID) | ORAL | 0 refills | Status: AC | PRN

## 2023-05-04 MED ORDER — ONDANSETRON HCL 4 MG/2ML IJ SOLN
4.0000 mg | Freq: Once | INTRAMUSCULAR | Status: AC
  Administered 2023-05-04: 4 mg via INTRAVENOUS
  Filled 2023-05-04: qty 2

## 2023-05-04 MED ORDER — FENTANYL CITRATE PF 50 MCG/ML IJ SOSY
50.0000 ug | PREFILLED_SYRINGE | Freq: Once | INTRAMUSCULAR | Status: AC
  Administered 2023-05-04: 50 ug via INTRAVENOUS
  Filled 2023-05-04: qty 1

## 2023-05-04 MED ORDER — ASPIRIN 81 MG PO CHEW
324.0000 mg | CHEWABLE_TABLET | Freq: Once | ORAL | Status: AC
  Administered 2023-05-04: 324 mg via ORAL
  Filled 2023-05-04: qty 4

## 2023-05-04 MED ORDER — OXYCODONE HCL 5 MG PO TABS
5.0000 mg | ORAL_TABLET | Freq: Once | ORAL | Status: AC
  Administered 2023-05-04: 5 mg via ORAL
  Filled 2023-05-04: qty 1

## 2023-05-04 MED ORDER — LIDOCAINE 5 % EX PTCH
1.0000 | MEDICATED_PATCH | Freq: Once | CUTANEOUS | Status: DC
  Administered 2023-05-04: 1 via TRANSDERMAL
  Filled 2023-05-04: qty 1

## 2023-05-04 MED ORDER — IOHEXOL 350 MG/ML SOLN
100.0000 mL | Freq: Once | INTRAVENOUS | Status: AC | PRN
  Administered 2023-05-04: 100 mL via INTRAVENOUS

## 2023-05-04 NOTE — ED Triage Notes (Signed)
Pt c/o right lower chest pain x 1-2 weeks, worsening last night around 2100. Pt reports the pain is worse with breathing. Vomited x 2 last night. Also reports feeling "worn out".

## 2023-05-04 NOTE — ED Notes (Signed)
Patient transported to CT 

## 2023-05-04 NOTE — ED Provider Notes (Signed)
[facility] DEPARTMENT AT [facility] Provider Note  CSN: [id number] Arrival date & time: 05/04/23 0901  Chief Complaint(s) Chest Pain  HPI Mike Floyd is a 62 y.o. male with past medical history as below, significant for AICD (removed 2018), CAD, CKD, ED, erosive esophagitis, dilated cardiomyopathy who presents to the ED with complaint of chest pain.  Chest pain ongoing approximately 2 weeks, intermittent.  Worsened last night around 4 AM, right-sided midsternal.  Sharp and stabbing, heaviness or pressure sensation.  Difficulty with inspiration secondary to pain.  When he lies down flat he feels short of breath, when he walks he feels short of breath.  Pain is worsened with deep inspiration and direct palpation.  Vomited twice this morning after the pain started.  Also reports epigastric burning and stabbing discomfort.  No change with bowel or bladder function.  No rashes, no fevers or chills.  No medication prior to arrival, he was scheduled for CT coronary earlier this week but was rescheduled secondary to abnormal vital signs.  No recent diet or medication changes.  No history of DVT or PE  Past Medical History Past Medical History:  Diagnosis Date   AICD (automatic cardioverter/defibrillator) present 11/2007   a. 11/2007 SJM Current VR - single lead ICD  - Removed 2018 - "it was burning me"   Anxiety    CAD (coronary artery disease)    non-obstructive CAD by Cor CT in 2020 // Myoview 3/22: EF 57, small inf-sept defect c/w scar, no ischemia; low risk      Chest pain    a. 10/2007 Cath:  normal Cors.   CKD (chronic kidney disease), stage II    DDD (degenerative disc disease), lumbar    Diabetes mellitus DX: 2010   Erosive esophagitis    a. per EGD (08/2011), Dr. Karilyn Cota - Erosive reflux esophagitis improved but not completely healed since previous EGD 3 years ago. Bx showing  ulcerated gatroesophageal junction mucosa. negative for H. pylori   GERD (gastroesophageal  reflux disease)    Gout    Hearing deficit    a. wear bilateral hearing aides   History of hiatal hernia    Hypertension    Mildly dilatd aortic root (HCC)    CMR 4/22: EF 52, no LGE; d/w Dr. Lorretta Harp root 38 mm (mildly dilated)   Myocardial infarction [facility]) 2011   Neuropathy    Feet and legs   Nonischemic dilated cardiomyopathy (HCC)    a. H/O EF as low as 35-40% by LV gram 10/2007;  b. Echo 02/2011 EF 50-55%, inf HK, Gr 1 DD // CMR 4/22: EF 52, no LGE; d/w Dr. Lorretta Harp root 38 mm (mildly dilated)    Renal insufficiency    Sleep apnea    pt doesnt use, states"I cant afford one". PCP aware   Stroke (HCC)    mini-stroke in 2014   TIA (transient ischemic attack)    July, 2013   Tobacco abuse, in remission 06/27/2009   Discontinued in 2009     Wears dentures    top plate   WPW (Wolff-Parkinson-White syndrome)    a. s/p RFCA @ [facility] - 1999   Patient Active Problem List   Diagnosis Date Noted   Acute osteomyelitis of metatarsal bone of left foot (HCC)    Subacute osteomyelitis, left ankle and foot (HCC) 05/13/2022   Non-pressure chronic ulcer of other part of right foot limited to breakdown of skin (HCC)    Contracture of right Achilles tendon  Mildly dilatd aortic root (HCC)    Contracture of left Achilles tendon    Metatarsal deformity, left    CAD (coronary artery disease)    Hallux hammertoe, left 12/11/2019   Diarrhea 06/27/2019   Diastasis recti 08/22/2018   Umbilical hernia without obstruction and without gangrene 08/22/2018   Vitamin D deficiency 06/23/2017   Essential hypertension, benign 06/15/2017   Mixed hyperlipidemia 06/15/2017   Class 1 obesity due to excess calories with serious comorbidity and body mass index (BMI) of 31.0 to 31.9 in adult 06/15/2017   Malfunction of implantable cardioverter-defibrillator (ICD) electrode 10/07/2016   Chronic kidney disease (CKD), stage III (moderate) (HCC) 04/04/2015   Bladder neck obstruction 03/21/2015   Difficult  or painful urination 03/21/2015   Flank pain 03/21/2015   Delayed onset of urination 03/21/2015   Calculus of kidney 03/21/2015   Erosive esophagitis 12/27/2013   Hypotension due to drugs 07/02/2013   Chronic systolic heart failure (HCC) 04/24/2013   CAP (community acquired pneumonia) 03/16/2013   HTN (hypertension) 03/15/2013   Hydrocele 03/15/2013   Spermatocele 03/15/2013   DOE (dyspnea on exertion) 03/15/2013   OSA (obstructive sleep apnea) 01/09/2013   Chronic kidney disease, stage 3 (HCC) 01/03/2013   Chest pain 12/26/2012   History of diagnostic tests 09/06/2012   TIA (transient ischemic attack)    Cardiomyopathy, nonischemic (HCC) 02/24/2010   AICD (automatic cardioverter/defibrillator) 02/24/2010   DM type 2 causing vascular disease (HCC) 06/27/2009   Gout 06/27/2009   Tobacco abuse, in remission 06/27/2009   COLONIC POLYPS 10/31/2008   GERD (gastroesophageal reflux disease) 10/31/2008   Home Medication(s) Prior to Admission medications   Medication Sig Start Date End Date Taking? Authorizing Provider  cyclobenzaprine (FLEXERIL) 10 MG tablet Take 1 tablet (10 mg total) by mouth 2 (two) times daily as needed for up to 14 days for muscle spasms. 05/04/23 05/18/23 Yes Tanda Rockers A, DO  naproxen sodium (ALEVE) 220 MG tablet Take 1 tablet (220 mg total) by mouth 2 (two) times daily as needed for up to 7 days. 05/04/23 05/11/23 Yes Tanda Rockers A, DO  sucralfate (CARAFATE) 1 g tablet Take 1 tablet (1 g total) by mouth with breakfast, with lunch, and with evening meal for 7 days. 05/04/23 05/11/23 Yes Sloan Leiter, DO  amitriptyline (ELAVIL) 50 MG tablet Take 50 mg by mouth at bedtime. 12/04/13   [provider]  aspirin 81 MG tablet Take 81 mg by mouth daily.    [provider]  atorvastatin (LIPITOR) 20 MG tablet Take 20 mg by mouth daily.    [provider]  B-D ULTRAFINE III SHORT PEN 31G X 8 MM MISC USE WITH TRESBIA. 10/27/17   Roma Kayser, MD   calcium carbonate (OSCAL) 1500 (600 Ca) MG TABS tablet Take 600 mg of elemental calcium by mouth daily with breakfast.    [provider]  carvedilol (COREG) 12.5 MG tablet TAKE 1 TABLET BY MOUTH TWICE DAILY. 03/18/22   Wendall Stade, MD  famotidine (PEPCID) 20 MG tablet Take 20 mg by mouth daily.    [provider]  furosemide (LASIX) 20 MG tablet Take 1 tablet (20 mg total) by mouth daily. 03/16/23   Strader, Lennart Pall, PA-C  gabapentin (NEURONTIN) 600 MG tablet Take 300 mg by mouth 3 (three) times daily. 09/05/20   [provider]  isosorbide mononitrate (IMDUR) 60 MG 24 hr tablet Take 1 tablet (60 mg total) by mouth daily. 03/11/23 06/09/23  Ellsworth Lennox, PA-C  LORazepam (ATIVAN) 1 MG tablet Take 1 mg by mouth at bedtime.    [provider]  metoprolol tartrate (LOPRESSOR) 100 MG tablet Take 100 mg (1 tablet ) 2 hours before Coronary CT 03/11/23   Iran Ouch, Lennart Pall, PA-C  nitroGLYCERIN (NITROSTAT) 0.4 MG SL tablet Place 1 tablet (0.4 mg total) under the tongue every 5 (five) minutes as needed for chest pain. 03/11/23   Strader, Lennart Pall, PA-C  NOVOLOG MIX 70/30 FLEXPEN (70-30) 100 UNIT/ML FlexPen Inject 50 Units into the skin 2 (two) times daily. 03/05/22   [provider]  ONETOUCH VERIO test strip TESTING 4 TIMES DAILY. 12/25/18   Roma Kayser, MD  oxycodone (ROXICODONE) 30 MG immediate release tablet Take 30-60 mg by mouth 4 (four) times daily as needed for pain. 12/16/20   [provider]  pantoprazole (PROTONIX) 40 MG tablet Take 40 mg by mouth 2 (two) times daily. 07/29/21   [provider]  potassium chloride SA (KLOR-CON M) 20 MEQ tablet TAKE (1) TABLET BY MOUTH ONCE DAILY. 02/09/23   Wendall Stade, MD  tamsulosin (FLOMAX) 0.4 MG CAPS capsule Take 0.4 mg by mouth daily. am    [provider]  vitamin B-12 (CYANOCOBALAMIN) 1000 MCG tablet Take 1,000 mcg by mouth daily.    [provider]                                                                                                                                     Past Surgical History Past Surgical History:  Procedure Laterality Date   AMPUTATION Left 05/19/2022   Procedure: TRANSMETATARSAL AMPUTATION LEFT FOOT;  Surgeon: Nadara Mustard, MD;  Location: [facility] OR;  Service: Orthopedics;  Laterality: Left;   APPENDECTOMY     BACK SURGERY     1995   CARDIAC CATHETERIZATION     2009   CARDIAC DEFIBRILLATOR PLACEMENT     CHOLECYSTECTOMY     CHOLECYSTECTOMY, LAPAROSCOPIC     11/2007   COLONOSCOPY W/ POLYPECTOMY  2009   ELBOW SURGERY Left 06/2010   ESOPHAGEAL DILATION N/A 11/08/2014   Procedure: ESOPHAGEAL DILATION;  Surgeon: Malissa Hippo, MD;  Location: [facility] ORS;  Service: Endoscopy;  Laterality: N/A;  #56,    ESOPHAGOGASTRODUODENOSCOPY  03/31/2012   also 08/2011; [name]   ESOPHAGOGASTRODUODENOSCOPY (EGD) WITH PROPOFOL N/A 11/08/2014   Procedure: ESOPHAGOGASTRODUODENOSCOPY (EGD) WITH PROPOFOL;  Surgeon: Malissa Hippo, MD;  Location: [facility] ORS;  Service: Endoscopy;  Laterality: N/A;  [place] is 25 , [place] is 35   FOOT ARTHRODESIS Left 10/24/2020   Procedure: LEFT GASTROCNEMIUS RECESSION, DORSIFLEXION OSTEOTOMY 1ST MT;  Surgeon: Nadara Mustard, MD;  Location: [facility] OR;  Service: Orthopedics;  Laterality: Left;   FOOT ARTHRODESIS Right 01/09/2021   Procedure: CLOSING WEDGE OSTEOTOMY RIGHT 1ST METATARSAL;  Surgeon: Nadara Mustard, MD;  Location: [facility] OR;  Service: Orthopedics;  Laterality: Right;   GASTROCNEMIUS RECESSION  Right 01/09/2021   Procedure: RIGHT GASTROCNEMIUS RECESSION;  Surgeon: Nadara Mustard, MD;  Location: [facility] OR;  Service: Orthopedics;  Laterality: Right;   ICD LEAD REMOVAL N/A 10/07/2016   Procedure: ICD LEAD REMOVAL ;  Surgeon: Marinus Maw, MD;  Location: [facility] OR;  Service: Cardiovascular;  Laterality: N/A;   MULTIPLE TOOTH EXTRACTIONS     RADIOFREQUENCY ABLATION  1999   WPW; performed at [facility]   TEE WITHOUT CARDIOVERSION N/A  10/07/2016   Procedure: TRANSESOPHAGEAL ECHOCARDIOGRAM (TEE);  Surgeon: Marinus Maw, MD;  Location: [facility] OR;  Service: Cardiovascular;  Laterality: N/A;   Family History Family History  Problem Relation Age of Onset   Heart attack Mother 37   Hypertension Mother    Diabetes Mother    Kidney disease Mother    Breast cancer Mother    Heart attack Father 69   Hypertension Father    Diabetes Father    Stroke Brother    Heart attack Brother 47   Stroke Maternal Grandmother 74   Diabetes Brother    Hypertension Brother     Social History Social History   Tobacco Use   Smoking status: Former    Current packs/day: 0.00    Average packs/day: 1.5 packs/day for 39.1 years (58.6 ttl pk-yrs)    Types: Cigarettes    Start date: 06/30/1971    Quit date: 07/30/2010    Years since quitting: 12.7   Smokeless tobacco: Former    Types: Chew    Quit date: 03/26/1994  Vaping Use   Vaping status: Former   Start date: 07/30/2010   Quit date: 06/29/2016  Substance Use Topics   Alcohol use: No    Alcohol/week: 1.0 standard drink of alcohol    Types: 1 Cans of beer per week    Comment: used "years ago"   Drug use: No   Allergies Patient has no known allergies.  Review of Systems Review of Systems  Constitutional:  Negative for chills and fatigue.  Respiratory:  Positive for shortness of breath. Negative for chest tightness.   Cardiovascular:  Positive for chest pain. Negative for palpitations and leg swelling.  Gastrointestinal:  Positive for abdominal pain, nausea and vomiting. Negative for diarrhea.  Musculoskeletal:  Negative for back pain.  Skin:  Negative for rash and wound.  Neurological:  Negative for numbness and headaches.  All other systems reviewed and are negative.   Physical Exam Vital Signs  I have reviewed the triage vital signs BP 118/75   Pulse 76   Temp 97.6 F (36.4 C) (Oral)   Resp 11   Ht 6\' 1"  (1.854 m)   Wt 97.5 kg   SpO2 99%   BMI 28.37 kg/m   Physical Exam Vitals and nursing note reviewed.  Constitutional:      General: He is not in acute distress.    Appearance: Normal appearance. He is well-developed.  HENT:     Head: Normocephalic and atraumatic.     Right Ear: External ear normal.     Left Ear: External ear normal.     Mouth/Throat:     Mouth: Mucous membranes are moist.  Eyes:     General: No scleral icterus. Cardiovascular:     Rate and Rhythm: Regular rhythm. Tachycardia present.     Pulses: Normal pulses.     Heart sounds: Normal heart sounds.  Pulmonary:     Effort: Pulmonary effort is normal. No respiratory distress.     Breath sounds: Normal breath  sounds.  Chest:    Abdominal:     General: Abdomen is flat.     Palpations: Abdomen is soft.     Tenderness: There is no abdominal tenderness.  Musculoskeletal:     Cervical back: No rigidity.     Right lower leg: No edema.     Left lower leg: No edema.  Skin:    General: Skin is warm and dry.     Capillary Refill: Capillary refill takes less than 2 seconds.  Neurological:     Mental Status: He is alert.  Psychiatric:        Mood and Affect: Mood normal.        Behavior: Behavior normal.     ED Results and Treatments Labs (all labs ordered are listed, but only abnormal results are displayed) Labs Reviewed  COMPREHENSIVE METABOLIC PANEL - Abnormal; Notable for the following components:      Result Value   Sodium 132 (*)    Chloride 97 (*)    Glucose, Bld 257 (*)    Total Bilirubin 2.0 (*)    All other components within normal limits  CBC WITH DIFFERENTIAL/PLATELET - Abnormal; Notable for the following components:   MCHC 36.1 (*)    All other components within normal limits  SARS CORONAVIRUS 2 BY RT PCR  BRAIN NATRIURETIC PEPTIDE  LIPASE, BLOOD  TROPONIN I (HIGH SENSITIVITY)  TROPONIN I (HIGH SENSITIVITY)                                                                                                                          Radiology CT  Angio Chest/Abd/Pel for Dissection W and/or Wo Contrast  Result Date: 05/04/2023 CLINICAL DATA:  Acute aortic syndrome suspected. Right-sided chest pain over the last 2 weeks, worsening yesterday. EXAM: CT ANGIOGRAPHY CHEST, ABDOMEN AND PELVIS TECHNIQUE: Multidetector CT imaging through the chest, abdomen and pelvis was performed using the standard protocol during bolus administration of intravenous contrast. Multiplanar reconstructed images and MIPs were obtained and reviewed to evaluate the vascular anatomy. RADIATION DOSE REDUCTION: This exam was performed according to the departmental dose-optimization program which includes automated exposure control, adjustment of the mA and/or kV according to patient size and/or use of iterative reconstruction technique. CONTRAST:  OMNIPAQUE IOHEXOL 350 MG/ML SOLN COMPARISON:  Chest radiography same day FINDINGS: CTA CHEST FINDINGS Cardiovascular: Heart size is normal. No pericardial fluid. Some aortic atherosclerotic calcification and coronary artery calcification is present. No aneurysm or dissection. Pulmonary arterial opacification is good. No pulmonary emboli. Mediastinum/Nodes: No mediastinal or hilar mass or lymphadenopathy. Lungs/Pleura: No pleural effusion. No infiltrate or collapse. No mass or nodule. No emphysematous change. Musculoskeletal: Ordinary spinal degenerative changes. Review of the MIP images confirms the above findings. CTA ABDOMEN AND PELVIS FINDINGS VASCULAR Aorta: Atherosclerosis, mild.  No aneurysm or dissection. Celiac: Normal SMA: Normal Renals: 2 renal arteries on each side.  No stenosis. IMA: Normal Inflow: Normal Veins: Normal Review of the MIP images confirms the above  findings. NON-VASCULAR Hepatobiliary: Liver parenchyma is normal. Previous cholecystectomy. Pancreas: Normal Spleen: Normal.  Early perfusion heterogeneity. Adrenals/Urinary Tract: Adrenal glands are normal. Kidneys are normal except for small simple cysts that do not  require further follow-up. No mass, stone or hydronephrosis. Bladder is normal. Stomach/Bowel: Stomach and small intestine are normal. No acute colon abnormality. Mild diverticulosis of the left colon without evidence of diverticulitis. Lymphatic: No lymphadenopathy Reproductive: Normal Other: No free fluid or air. Musculoskeletal: Ordinary spinal degenerative changes. Review of the MIP images confirms the above findings. IMPRESSION: 1. No acute vascular finding. No aneurysm or dissection. No pulmonary emboli. 2. No acute chest, abdomen or pelvic finding. 3. Aortic atherosclerosis. Coronary artery calcification. Both are mild in degree. Aortic Atherosclerosis (ICD10-I70.0). Electronically Signed   By: Paulina Fusi M.D.   On: 05/04/2023 11:58   DG Chest Portable 1 View  Result Date: 05/04/2023 CLINICAL DATA:  Right-sided chest pain over the last 2 weeks. Symptoms worsened yesterday. EXAM: PORTABLE CHEST 1 VIEW COMPARISON:  03/02/2023 FINDINGS: The heart size and mediastinal contours are within normal limits. Both lungs are clear. The visualized skeletal structures are unremarkable. IMPRESSION: No active disease. Electronically Signed   By: Paulina Fusi M.D.   On: 05/04/2023 11:53    Pertinent labs & imaging results that were available during my care of the patient were reviewed by me and considered in my medical decision making (see MDM for details).  Medications Ordered in ED Medications  lidocaine (LIDODERM) 5 % 1 patch (1 patch Transdermal Patch Applied 05/04/23 1216)  sodium chloride 0.9 % bolus 1,000 mL (0 mLs Intravenous Stopped 05/04/23 1109)  fentaNYL (SUBLIMAZE) injection 50 mcg (50 mcg Intravenous Given 05/04/23 1012)  aspirin chewable tablet 324 mg (324 mg Oral Given 05/04/23 1007)  alum & mag hydroxide-simeth (MAALOX/MYLANTA) 200-200-20 MG/5ML suspension 30 mL (30 mLs Oral Given 05/04/23 1006)    And  lidocaine (XYLOCAINE) 2 % viscous mouth solution 15 mL (15 mLs Oral Given 05/04/23 1010)   ondansetron (ZOFRAN) injection 4 mg (4 mg Intravenous Given 05/04/23 1006)  iohexol (OMNIPAQUE) 350 MG/ML injection 100 mL (100 mLs Intravenous Contrast Given 05/04/23 1130)  ketorolac (TORADOL) 15 MG/ML injection 15 mg (15 mg Intravenous Given 05/04/23 1214)  oxyCODONE (Oxy IR/ROXICODONE) immediate release tablet 5 mg (5 mg Oral Given 05/04/23 1439)                                                                                                                                     Procedures Procedures  (including critical care time)  Medical Decision Making / ED Course    Medical Decision Making:    ZEE VILL is a 62 y.o. male  with past medical history as below, significant for AICD (removed 2018), CAD, CKD, ED, erosive esophagitis, dilated cardiomyopathy who presents to the ED with complaint of chest pain.. The complaint involves an extensive differential diagnosis and also carries with  it a high risk of complications and morbidity.  Serious etiology was considered. Ddx includes but is not limited to: Differential includes all life-threatening causes for chest pain. This includes but is not exclusive to acute coronary syndrome, aortic dissection, pulmonary embolism, cardiac tamponade, community-acquired pneumonia, pericarditis, musculoskeletal chest wall pain, etc. Differential diagnosis includes but is not exclusive to acute cholecystitis, intrathoracic causes for epigastric abdominal pain, gastritis, duodenitis, pancreatitis, small bowel or large bowel obstruction, abdominal aortic aneurysm, hernia, gastritis, etc.   Complete initial physical exam performed, notably the patient  was ongoing discomfort, no hypoxia.  Tachycardia noted.    Reviewed and confirmed nursing documentation for past medical history, family history, social history.  Vital signs reviewed.    Clinical Course as of 05/04/23 1607  Wed May 04, 2023  1209 CTA negative [SG]  1308 Feeling better [SG]  1430 Feeling  better, chest pain appears atypical, negative workup here, trop neg x2, CTA neg, HEART score is 4, will have him f/u with cardiology, he has o/p ct coronary pending [SG]  1500 Spoke w/ dr Diona Browner, nothing urgent at this time   from cardiac perspective  [SG]    Clinical Course User Index [SG] Sloan Leiter, DO     Patient with 2 weeks of chest pain, worse in the past 12 hours.  Midsternal right-sided and epigastric.  Worse with palpation and inspiration.  Will collect screening labs, get CTA, analgesia, IV fluids ASA  Discussed with Dr. Diona Browner, favor this pain to be noncardiac.  Reference recent negative stress test, negative cardiac enzymes and stable EKG today.  Pain appears atypical for ACS.  Symptoms improved following analgesics.  Does have history of prior esophagitis.  No melena or BRBPR, no vomiting.  He is on 30 mg of oxycodone at home but was able to achieve improvement to his pain with medications here.  Favor atypical chest pain,  possible GI source or MSK.  Supportive care for home, add Carafate to his PPI, follow-up with GI in 2 weeks.  Follow-up with PCP tomorrow morning  The patient's chest pain is not suggestive of pulmonary embolus, cardiac ischemia, aortic dissection, pericarditis, myocarditis, pulmonary embolism, pneumothorax, pneumonia, Zoster, or esophageal perforation, or other serious etiology.  Historically not abrupt in onset, tearing or ripping, pulses symmetric. EKG nonspecific for ischemia/infarction. No dysrhythmias, brugada, WPW, prolonged QT noted.   The patient improved significantly and was discharged in stable condition. Detailed discussions were had with the patient regarding current findings, and need for close f/u with PCP or on call doctor. The patient has been instructed to return immediately if the symptoms worsen in any way for re-evaluation. Patient verbalized understanding and is in agreement with current care plan. All questions answered prior to  discharge.                 Additional history obtained: -Additional history obtained from family -External records from outside source obtained and reviewed including: Chart review including previous notes, labs, imaging, consultation notes including  Prior labs and imaging, prior echo, prior stress test, PDMP, medications   Lab Tests: -I ordered, reviewed, and interpreted labs.   The pertinent results include:   Labs Reviewed  COMPREHENSIVE METABOLIC PANEL - Abnormal; Notable for the following components:      Result Value   Sodium 132 (*)    Chloride 97 (*)    Glucose, Bld 257 (*)    Total Bilirubin 2.0 (*)    All other components within normal limits  CBC WITH DIFFERENTIAL/PLATELET - Abnormal; Notable for the following components:   MCHC 36.1 (*)    All other components within normal limits  SARS CORONAVIRUS 2 BY RT PCR  BRAIN NATRIURETIC PEPTIDE  LIPASE, BLOOD  TROPONIN I (HIGH SENSITIVITY)  TROPONIN I (HIGH SENSITIVITY)    Notable for labs stable, bilirubin mildly elevated, similar to prior  EKG   EKG Interpretation Date/Time:  Wednesday May 04 2023 09:20:16 EDT Ventricular Rate:  94 PR Interval:  174 QRS Duration:  95 QT Interval:  358 QTC Calculation: 448 R Axis:   63  Text Interpretation: Sinus rhythm Minimal ST elevation, anterior leads similar to prior no stemi Confirmed by Tanda Rockers ([id number]) on 05/04/2023 9:29:41 AM         Imaging Studies ordered: I ordered imaging studies including CTA I independently visualized the following imaging with scope of interpretation limited to determining acute life threatening conditions related to emergency care; findings noted above, significant for stable imaging I independently visualized and interpreted imaging. I agree with the radiologist interpretation   Medicines ordered and prescription drug management: Meds ordered this encounter  Medications   sodium chloride 0.9 % bolus 1,000 mL    fentaNYL (SUBLIMAZE) injection 50 mcg   aspirin chewable tablet 324 mg   AND Linked Order Group    alum & mag hydroxide-simeth (MAALOX/MYLANTA) 200-200-20 MG/5ML suspension 30 mL    lidocaine (XYLOCAINE) 2 % viscous mouth solution 15 mL   ondansetron (ZOFRAN) injection 4 mg   iohexol (OMNIPAQUE) 350 MG/ML injection 100 mL   lidocaine (LIDODERM) 5 % 1 patch   ketorolac (TORADOL) 15 MG/ML injection 15 mg   oxyCODONE (Oxy IR/ROXICODONE) immediate release tablet 5 mg   naproxen sodium (ALEVE) 220 MG tablet    Sig: Take 1 tablet (220 mg total) by mouth 2 (two) times daily as needed for up to 7 days.    Dispense:  14 tablet    Refill:  0   sucralfate (CARAFATE) 1 g tablet    Sig: Take 1 tablet (1 g total) by mouth with breakfast, with lunch, and with evening meal for 7 days.    Dispense:  21 tablet    Refill:  0   cyclobenzaprine (FLEXERIL) 10 MG tablet    Sig: Take 1 tablet (10 mg total) by mouth 2 (two) times daily as needed for up to 14 days for muscle spasms.    Dispense:  14 tablet    Refill:  0    -I have reviewed the patients home medicines and have made adjustments as needed   Consultations Obtained: I requested consultation with the cardiology,  and discussed lab and imaging findings as well as pertinent plan - they recommend: No further cardiac w/u at this time   Cardiac Monitoring: The patient was maintained on a cardiac monitor.  I personally viewed and interpreted the cardiac monitored which showed an underlying rhythm of: NSR  Social Determinants of Health:  Diagnosis or treatment significantly limited by social determinants of health: former smoker   Reevaluation: After the interventions noted above, I reevaluated the patient and found that they have improved  Co morbidities that complicate the patient evaluation  Past Medical History:  Diagnosis Date   AICD (automatic cardioverter/defibrillator) present 11/2007   a. 11/2007 SJM Current VR - single lead ICD  -  Removed 2018 - "it was burning me"   Anxiety    CAD (coronary artery disease)    non-obstructive CAD by Cor CT in 2020 //  Myoview 3/22: EF 57, small inf-sept defect c/w scar, no ischemia; low risk      Chest pain    a. 10/2007 Cath:  normal Cors.   CKD (chronic kidney disease), stage II    DDD (degenerative disc disease), lumbar    Diabetes mellitus DX: 2010   Erosive esophagitis    a. per EGD (08/2011), Dr. Karilyn Cota - Erosive reflux esophagitis improved but not completely healed since previous EGD 3 years ago. Bx showing  ulcerated gatroesophageal junction mucosa. negative for H. pylori   GERD (gastroesophageal reflux disease)    Gout    Hearing deficit    a. wear bilateral hearing aides   History of hiatal hernia    Hypertension    Mildly dilatd aortic root (HCC)    CMR 4/22: EF 52, no LGE; d/w Dr. Lorretta Harp root 38 mm (mildly dilated)   Myocardial infarction [facility]) 2011   Neuropathy    Feet and legs   Nonischemic dilated cardiomyopathy (HCC)    a. H/O EF as low as 35-40% by LV gram 10/2007;  b. Echo 02/2011 EF 50-55%, inf HK, Gr 1 DD // CMR 4/22: EF 52, no LGE; d/w Dr. Lorretta Harp root 38 mm (mildly dilated)    Renal insufficiency    Sleep apnea    pt doesnt use, states"I cant afford one". PCP aware   Stroke (HCC)    mini-stroke in 2014   TIA (transient ischemic attack)    July, 2013   Tobacco abuse, in remission 06/27/2009   Discontinued in 2009     Wears dentures    top plate   WPW (Wolff-Parkinson-White syndrome)    a. s/p RFCA @ [facility] - 1999      Dispostion: Disposition decision including need for hospitalization was considered, and patient discharged from emergency department.    Final Clinical Impression(s) / ED Diagnoses Final diagnoses:  Chest wall pain        Sloan Leiter, DO 05/04/23 1607

## 2023-05-04 NOTE — Discharge Instructions (Addendum)
It was a pleasure caring for you today in the emergency department.  Please return to the emergency department for any worsening or worrisome symptoms.  Please follow-up with your primary care doctor tomorrow, please see gastroenterology in the next 2 weeks

## 2023-05-04 NOTE — ED Notes (Signed)
No relief from mylanta

## 2023-05-04 NOTE — ED Notes (Signed)
See triage notes. Pt grimacing and holding RUQ area, states has been hurting there x 2 weeks and worsening. Pain worse with deep breathing and any movement per pt. Pt jumped and grimaced when attempted to raise bed up. Pt slightly breathing fast. Encouraged to take slower breaths. Denies any shob/dizziness. States did not get nauseated until early this am and vomited twice. Pt is non diaphoretic. Color wnl.

## 2023-05-04 NOTE — ED Notes (Signed)
Pt verbalized understanding of discharge instructions. Opportunity for questions provided.  

## 2023-05-09 ENCOUNTER — Ambulatory Visit: Payer: Medicare Other | Admitting: Orthopedic Surgery

## 2023-05-09 ENCOUNTER — Telehealth (HOSPITAL_COMMUNITY): Payer: Self-pay | Admitting: Emergency Medicine

## 2023-05-09 DIAGNOSIS — L97521 Non-pressure chronic ulcer of other part of left foot limited to breakdown of skin: Secondary | ICD-10-CM

## 2023-05-09 DIAGNOSIS — L97511 Non-pressure chronic ulcer of other part of right foot limited to breakdown of skin: Secondary | ICD-10-CM

## 2023-05-09 DIAGNOSIS — Z89432 Acquired absence of left foot: Secondary | ICD-10-CM | POA: Diagnosis not present

## 2023-05-09 NOTE — Telephone Encounter (Signed)
Unable to leave vm Thoren Hosang RN Navigator Cardiac Imaging [facility] [phone] Office  [phone] Cell  

## 2023-05-10 ENCOUNTER — Telehealth (HOSPITAL_COMMUNITY): Payer: Self-pay | Admitting: *Deleted

## 2023-05-10 ENCOUNTER — Other Ambulatory Visit (HOSPITAL_COMMUNITY): Payer: Self-pay | Admitting: *Deleted

## 2023-05-10 ENCOUNTER — Ambulatory Visit (HOSPITAL_COMMUNITY)
Admission: RE | Admit: 2023-05-10 | Discharge: 2023-05-10 | Disposition: A | Discharge status: Home / Self Care | Payer: Medicare Other | Source: Ambulatory Visit | Attending: Student | Admitting: Student

## 2023-05-10 DIAGNOSIS — R079 Chest pain, unspecified: Secondary | ICD-10-CM | POA: Insufficient documentation

## 2023-05-10 DIAGNOSIS — R072 Precordial pain: Secondary | ICD-10-CM

## 2023-05-10 DIAGNOSIS — I517 Cardiomegaly: Secondary | ICD-10-CM | POA: Diagnosis not present

## 2023-05-10 DIAGNOSIS — I2510 Atherosclerotic heart disease of native coronary artery without angina pectoris: Secondary | ICD-10-CM | POA: Insufficient documentation

## 2023-05-10 DIAGNOSIS — R0609 Other forms of dyspnea: Secondary | ICD-10-CM | POA: Diagnosis not present

## 2023-05-10 MED ORDER — IVABRADINE HCL 7.5 MG PO TABS: ORAL_TABLET | ORAL | 0 refills | Status: DC

## 2023-05-10 MED ORDER — NITROGLYCERIN 0.4 MG SL SUBL
0.8000 mg | SUBLINGUAL_TABLET | Freq: Once | SUBLINGUAL | Status: AC
  Administered 2023-05-10: 0.8 mg via SUBLINGUAL

## 2023-05-10 MED ORDER — NITROGLYCERIN 0.4 MG SL SUBL
SUBLINGUAL_TABLET | SUBLINGUAL | Status: AC
  Filled 2023-05-10: qty 2

## 2023-05-10 MED ORDER — IOHEXOL 350 MG/ML SOLN
100.0000 mL | Freq: Once | INTRAVENOUS | Status: AC | PRN
  Administered 2023-05-10: 100 mL via INTRAVENOUS

## 2023-05-10 NOTE — Telephone Encounter (Signed)
Received call from patient regarding upcoming cardiac imaging study; pt verbalizes understanding of appt date/time, parking situation and where to check in, pre-test NPO status and medications ordered, and verified current allergies; name and call back number provided for further questions should they arise Johney Frame RN Navigator Cardiac Imaging [facility] [phone] office [phone] cell  Patient unable to get ivabradine at [facility] and request rx sent to [facility] in [place].  Rx sent to [facility] in [place], confirmed medication in stock.  Patient will pick up this morning and take 2 hours prior to his CT.

## 2023-05-11 ENCOUNTER — Other Ambulatory Visit: Payer: Self-pay | Admitting: Student

## 2023-05-17 ENCOUNTER — Encounter: Payer: Self-pay | Admitting: Orthopedic Surgery

## 2023-05-17 NOTE — Progress Notes (Signed)
Office Visit Note   Patient: Mike Floyd           Date of Birth: [birth year]           MRN: [id number] Visit Date: 05/09/2023              Requested by: Elfredia Nevins, MD [address] [place],  [place] [address] PCP: Elfredia Nevins, MD  Chief Complaint  Patient presents with   Left Foot - Wound Check    Hx TMA      HPI: Patient is a 62 year old gentleman who is seen in follow-up for Wagner grade 1 ulcers both feet.  Patient has had his orthotic modified at the last visit.  He is status post a left transmetatarsal amputation.  Assessment & Plan: Visit Diagnoses:  1. History of transmetatarsal amputation of left foot (HCC)   2. Non-pressure chronic ulcer of other part of left foot limited to breakdown of skin (HCC)   3. Non-pressure chronic ulcer of other part of right foot limited to breakdown of skin (HCC)     Plan: Wagner grade 1 ulcers debrided on both feet.  His orthotic was further modified on the right.  Follow-Up Instructions: No follow-ups on file.   Ortho Exam  Patient is alert, oriented, no adenopathy, well-dressed, normal affect, normal respiratory effort. Examination patient has a new Wagner grade 1 ulcer on the right foot.  After informed consent a 10 blade knife was used debride the skin and soft tissue back to healthy viable granulation tissue.  After debridement the wound measures 4 x 2 cm with healthy granulation tissue the orthotic was further modified.  Left foot transmetatarsal amputation patient has a Wagner grade 1 ulcer.  After informed consent a 10 blade knife was used to debride the skin and soft tissue back to healthy viable bleeding granulation tissue.  The wound measures 2 cm in diameter after debridement.  Imaging: No results found. No images are attached to the encounter.  Labs: Lab Results  Component Value Date   HGBA1C 9.7 (H) 05/13/2022   HGBA1C 7.0 (H) 06/15/2017   HGBA1C 5.2 03/15/2013   ESRSEDRATE 2 03/14/2023   CRP <1  03/14/2023   CRP 0.6 05/16/2022   CRP 2.2 06/27/2019   LABURIC 5.5 03/07/2012   REPTSTATUS 05/18/2022 FINAL 05/13/2022   CULT  05/13/2022    NO GROWTH 5 DAYS Performed at [facility] [address], [place], [place] [address]      Lab Results  Component Value Date   ALBUMIN 4.4 05/04/2023   ALBUMIN 4.1 02/26/2023   ALBUMIN 4.2 05/13/2022    Lab Results  Component Value Date   MG 1.8 02/26/2023   MG 1.7 05/20/2022   MG 2.0 05/19/2022   Lab Results  Component Value Date   VD25OH 18 (L) 06/15/2017    No results found for: "PREALBUMIN"    Latest Ref Rng & Units 05/04/2023    9:47 AM 02/26/2023   11:08 AM 02/26/2023   11:03 AM  CBC EXTENDED  WBC 4.0 - 10.5 K/uL 6.1   5.1   RBC 4.22 - 5.81 MIL/uL 4.86   4.91   Hemoglobin 13.0 - 17.0 g/dL 32.4  40.1  02.7   HCT 39.0 - 52.0 % 41.3  41.0  42.0   Platelets 150 - 400 K/uL 162   140   NEUT# 1.7 - 7.7 K/uL 4.5   3.9   Lymph# 0.7 - 4.0 K/uL 1.0   0.8  There is no height or weight on file to calculate BMI.  Orders:  No orders of the defined types were placed in this encounter.  No orders of the defined types were placed in this encounter.    Procedures: No procedures performed  Clinical Data: No additional findings.  ROS:  All other systems negative, except as noted in the HPI. Review of Systems  Objective: Vital Signs: There were no vitals taken for this visit.  Specialty Comments:  No specialty comments available.  PMFS History: Patient Active Problem List   Diagnosis Date Noted   Acute osteomyelitis of metatarsal bone of left foot (HCC)    Subacute osteomyelitis, left ankle and foot (HCC) 05/13/2022   Non-pressure chronic ulcer of other part of right foot limited to breakdown of skin (HCC)    Contracture of right Achilles tendon    Mildly dilatd aortic root (HCC)    Contracture of left Achilles tendon    Metatarsal deformity, left    CAD (coronary artery disease)    Hallux hammertoe, left  12/11/2019   Diarrhea 06/27/2019   Diastasis recti 08/22/2018   Umbilical hernia without obstruction and without gangrene 08/22/2018   Vitamin D deficiency 06/23/2017   Essential hypertension, benign 06/15/2017   Mixed hyperlipidemia 06/15/2017   Class 1 obesity due to excess calories with serious comorbidity and body mass index (BMI) of 31.0 to 31.9 in adult 06/15/2017   Malfunction of implantable cardioverter-defibrillator (ICD) electrode 10/07/2016   Chronic kidney disease (CKD), stage III (moderate) (HCC) 04/04/2015   Bladder neck obstruction 03/21/2015   Difficult or painful urination 03/21/2015   Flank pain 03/21/2015   Delayed onset of urination 03/21/2015   Calculus of kidney 03/21/2015   Erosive esophagitis 12/27/2013   Hypotension due to drugs 07/02/2013   Chronic systolic heart failure (HCC) 04/24/2013   CAP (community acquired pneumonia) 03/16/2013   HTN (hypertension) 03/15/2013   Hydrocele 03/15/2013   Spermatocele 03/15/2013   DOE (dyspnea on exertion) 03/15/2013   OSA (obstructive sleep apnea) 01/09/2013   Chronic kidney disease, stage 3 (HCC) 01/03/2013   Chest pain 12/26/2012   History of diagnostic tests 09/06/2012   TIA (transient ischemic attack)    Cardiomyopathy, nonischemic (HCC) 02/24/2010   AICD (automatic cardioverter/defibrillator) 02/24/2010   DM type 2 causing vascular disease (HCC) 06/27/2009   Gout 06/27/2009   Tobacco abuse, in remission 06/27/2009   COLONIC POLYPS 10/31/2008   GERD (gastroesophageal reflux disease) 10/31/2008   Past Medical History:  Diagnosis Date   AICD (automatic cardioverter/defibrillator) present 11/2007   a. 11/2007 SJM Current VR - single lead ICD  - Removed 2018 - "it was burning me"   Anxiety    CAD (coronary artery disease)    non-obstructive CAD by Cor CT in 2020 // Myoview 3/22: EF 57, small inf-sept defect c/w scar, no ischemia; low risk      Chest pain    a. 10/2007 Cath:  normal Cors.   CKD (chronic kidney  disease), stage II    DDD (degenerative disc disease), lumbar    Diabetes mellitus DX: 2010   Erosive esophagitis    a. per EGD (08/2011), Dr. Karilyn Cota - Erosive reflux esophagitis improved but not completely healed since previous EGD 3 years ago. Bx showing  ulcerated gatroesophageal junction mucosa. negative for H. pylori   GERD (gastroesophageal reflux disease)    Gout    Hearing deficit    a. wear bilateral hearing aides   History of hiatal hernia  Hypertension    Mildly dilatd aortic root (HCC)    CMR 4/22: EF 52, no LGE; d/w Dr. Lorretta Harp root 38 mm (mildly dilated)   Myocardial infarction [facility]) 2011   Neuropathy    Feet and legs   Nonischemic dilated cardiomyopathy (HCC)    a. H/O EF as low as 35-40% by LV gram 10/2007;  b. Echo 02/2011 EF 50-55%, inf HK, Gr 1 DD // CMR 4/22: EF 52, no LGE; d/w Dr. Lorretta Harp root 38 mm (mildly dilated)    Renal insufficiency    Sleep apnea    pt doesnt use, states"I cant afford one". PCP aware   Stroke (HCC)    mini-stroke in 2014   TIA (transient ischemic attack)    July, 2013   Tobacco abuse, in remission 06/27/2009   Discontinued in 2009     Wears dentures    top plate   WPW (Wolff-Parkinson-White syndrome)    a. s/p RFCA @ [facility] - 1999    Family History  Problem Relation Age of Onset   Heart attack Mother 52   Hypertension Mother    Diabetes Mother    Kidney disease Mother    Breast cancer Mother    Heart attack Father 38   Hypertension Father    Diabetes Father    Stroke Brother    Heart attack Brother 16   Stroke Maternal Grandmother 28   Diabetes Brother    Hypertension Brother     Past Surgical History:  Procedure Laterality Date   AMPUTATION Left 05/19/2022   Procedure: TRANSMETATARSAL AMPUTATION LEFT FOOT;  Surgeon: Nadara Mustard, MD;  Location: [facility] OR;  Service: Orthopedics;  Laterality: Left;   APPENDECTOMY     BACK SURGERY     1995   CARDIAC CATHETERIZATION     2009   CARDIAC DEFIBRILLATOR PLACEMENT      CHOLECYSTECTOMY     CHOLECYSTECTOMY, LAPAROSCOPIC     11/2007   COLONOSCOPY W/ POLYPECTOMY  2009   ELBOW SURGERY Left 06/2010   ESOPHAGEAL DILATION N/A 11/08/2014   Procedure: ESOPHAGEAL DILATION;  Surgeon: Malissa Hippo, MD;  Location: [facility] ORS;  Service: Endoscopy;  Laterality: N/A;  #56,    ESOPHAGOGASTRODUODENOSCOPY  03/31/2012   also 08/2011; [name]   ESOPHAGOGASTRODUODENOSCOPY (EGD) WITH PROPOFOL N/A 11/08/2014   Procedure: ESOPHAGOGASTRODUODENOSCOPY (EGD) WITH PROPOFOL;  Surgeon: Malissa Hippo, MD;  Location: [facility]S;  Service: Endoscopy;  Laterality: N/A;  [place] is 45 , [place] is 35   FOOT ARTHRODESIS Left 10/24/2020   Procedure: LEFT GASTROCNEMIUS RECESSION, DORSIFLEXION OSTEOTOMY 1ST MT;  Surgeon: Nadara Mustard, MD;  Location: [facility] OR;  Service: Orthopedics;  Laterality: Left;   FOOT ARTHRODESIS Right 01/09/2021   Procedure: CLOSING WEDGE OSTEOTOMY RIGHT 1ST METATARSAL;  Surgeon: Nadara Mustard, MD;  Location: [facility] OR;  Service: Orthopedics;  Laterality: Right;   GASTROCNEMIUS RECESSION Right 01/09/2021   Procedure: RIGHT GASTROCNEMIUS RECESSION;  Surgeon: Nadara Mustard, MD;  Location: [facility] OR;  Service: Orthopedics;  Laterality: Right;   ICD LEAD REMOVAL N/A 10/07/2016   Procedure: ICD LEAD REMOVAL ;  Surgeon: Marinus Maw, MD;  Location: [facility] OR;  Service: Cardiovascular;  Laterality: N/A;   MULTIPLE TOOTH EXTRACTIONS     RADIOFREQUENCY ABLATION  1999   WPW; performed at [facility]   TEE WITHOUT CARDIOVERSION N/A 10/07/2016   Procedure: TRANSESOPHAGEAL ECHOCARDIOGRAM (TEE);  Surgeon: Marinus Maw, MD;  Location: [facility] OR;  Service: Cardiovascular;  Laterality: N/A;   Social History  Occupational History   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    Comment: Laid off in 11/11  Tobacco Use   Smoking status: Former    Current packs/day: 0.00    Average packs/day: 1.5 packs/day for 39.1 years (58.6 ttl pk-yrs)    Types: Cigarettes    Start date: 06/30/1971    Quit  date: 07/30/2010    Years since quitting: 12.8   Smokeless tobacco: Former    Types: Chew    Quit date: 03/26/1994  Vaping Use   Vaping status: Former   Start date: 07/30/2010   Quit date: 06/29/2016  Substance and Sexual Activity   Alcohol use: No    Alcohol/week: 1.0 standard drink of alcohol    Types: 1 Cans of beer per week    Comment: used "years ago"   Drug use: No   Sexual activity: Never    Birth control/protection: None

## 2023-05-27 ENCOUNTER — Encounter: Payer: Self-pay | Admitting: Student

## 2023-05-27 ENCOUNTER — Telehealth: Payer: Self-pay | Admitting: Student

## 2023-05-27 ENCOUNTER — Ambulatory Visit: Payer: Medicare Other | Attending: Student | Admitting: Student

## 2023-05-27 VITALS — BP 106/64 | HR 98 | Ht 73.0 in | Wt 211.8 lb

## 2023-05-27 DIAGNOSIS — Z87891 Personal history of nicotine dependence: Secondary | ICD-10-CM | POA: Diagnosis not present

## 2023-05-27 DIAGNOSIS — I77810 Thoracic aortic ectasia: Secondary | ICD-10-CM | POA: Diagnosis not present

## 2023-05-27 DIAGNOSIS — R0609 Other forms of dyspnea: Secondary | ICD-10-CM | POA: Diagnosis not present

## 2023-05-27 DIAGNOSIS — I2510 Atherosclerotic heart disease of native coronary artery without angina pectoris: Secondary | ICD-10-CM

## 2023-05-27 DIAGNOSIS — I5032 Chronic diastolic (congestive) heart failure: Secondary | ICD-10-CM

## 2023-05-27 DIAGNOSIS — E785 Hyperlipidemia, unspecified: Secondary | ICD-10-CM

## 2023-05-27 DIAGNOSIS — R002 Palpitations: Secondary | ICD-10-CM

## 2023-05-27 MED ORDER — ISOSORBIDE MONONITRATE ER 30 MG PO TB24: 30.0000 mg | ORAL_TABLET | Freq: Every day | ORAL | 3 refills | Status: DC

## 2023-05-27 NOTE — Progress Notes (Unsigned)
Cardiology Office Note    Date:  05/27/2023  ID:  MADELINE RENTSCH, DOB 11-24-1960, MRN [id number] Cardiologist: Charlton Haws, MD    History of Present Illness:    Mike Floyd is a 62 y.o. male  with past medical history of CAD (prior Coronary CT in 01/2019 showed mild disease along mid-LAD and proximal LCx, low-risk NST in 10/2020), HFimpEF, history of ICD (placed in 2009 but removed in 2018), WPW (s/p RFA in 1999), HTN, HLD, Stage 2 CKD and prior CVA who presents to the office today for follow-up of his recent Coronary CT.  He was examined by myself in 03/2023 following a recent Emergency Department evaluation for chest pain over the past 2 days. Troponin values had been negative and EKG was without acute ST changes. He did report intermittent episodes of chest pain over the past few months which could last for hours at a time. His pain was overall felt to be atypical for angina and ESR, CRP and D-dimer were recommended for additional assessment with plans for a Coronary CTA if no significant abnormalities. Lab work was reassuring. His Coronary CTA was obtained earlier this month and showed minimal stenosis (0 to 24%) along the left main, proximal/mid LAD and proximal LCx. He did have a mildly dilated aortic root at 41 mm.  His PCP also reached out in the interim as well as they wanted to arrange for a monitor given palpitations. This showed predominantly normal sinus rhythm with an average heart rate 86 bpm. He did have rare PAC's and PVC's but overall less than 1% burden.  In talking with the patient today, he reports being under increased stress as his wife is currently admitted to the hospital with a UTI as she presented with AMS. He reports still having episodes of chest pain which occur at rest and can last for days at a time and spontaneously resolve. No association with exertion. Still reports dyspnea on exertion as well and has a prior tobacco use history but quit smoking over 10 years  ago. He denies any specific orthopnea, PND or pitting edema.  Reports his palpitations have improved since he saw his PCP.  He does consume over a pot of coffee a day and we reviewed this could be contributing to his symptoms.   Studies Reviewed:   EKG: EKG is not ordered today.   Event Monitor: 04/2023 Patch Wear Time:  2 days and 3 hours (2024-09-05T10:55:58-0400 to 2024-09-07T14:05:19-0400)   Patient had a min HR of 63 bpm, max HR of 120 bpm, and avg HR of 86 bpm. Predominant underlying rhythm was Sinus Rhythm. Isolated SVEs were rare (<1.0%), SVE Couplets were rare (<1.0%), and SVE Triplets were rare (<1.0%). No Isolated VEs, VE Couplets, or  VE Triplets were present.   Coronary CT: 05/2023 Coronary Arteries:  Normal coronary origin.  Right dominance.   RCA is a large dominant artery that gives rise to PDA and PLA. There is no plaque.   Left main is a large artery that gives rise to LAD and LCX arteries. Calcified plaque in left main causes 0-24% stenosis   LAD is a large vessel. Mixed plaque in proximal LAD causes 0-24% stenosis. Mixed plaque in mid LAD causes 0-24% stenosis   LCX is a non-dominant artery that gives rise to one large OM1 branch. Calcified plaque in proximal LCX causes 0-24% stenosis   Other findings:   Left Ventricle: Normal size   Left Atrium: Mild enlargement   Pulmonary Veins:  Normal configuration   Right Ventricle: Normal size   Right Atrium: Normal size   Cardiac valves: Mild mitral annular calcifications   Thoracic aorta: Dilated aortic root measuring 41mm   Pulmonary Arteries: Normal size   Systemic Veins: Normal drainage   Pericardium: Normal thickness   IMPRESSION: 1. Coronary calcium score of 119. This was 63rd percentile for age and sex matched control.   2. Total plaque volume 133mm3 which is 30th percentile for age and sex-matched controls (calcified plaque 61mm3; noncalcified plaque 136mm3). TPV is moderate   3.  Normal  coronary origin with right dominance.   4.  Nonobstructive CAD   5. Minimal (0-24%) stenosis in left main, proximal/mid LAD, and proximal LCX   6.  Dilated aortic root measuring 41mm   CAD-RADS 1. Minimal non-obstructive CAD (0-24%). Consider non-atherosclerotic causes of chest pain. Consider preventive therapy and risk factor modification.    Physical Exam:   VS:  BP 106/64   Pulse 98   Ht 6\' 1"  (1.854 m)   Wt 211 lb 12.8 oz (96.1 kg)   SpO2 100%   BMI 27.94 kg/m    Wt Readings from Last 3 Encounters:  05/27/23 211 lb 12.8 oz (96.1 kg)  05/04/23 215 lb (97.5 kg)  03/11/23 213 lb (96.6 kg)     GEN: Well nourished, well developed male appearing in no acute distress NECK: No JVD; No carotid bruits CARDIAC: RRR, no murmurs, rubs, gallops RESPIRATORY:  Clear to auscultation without rales, wheezing or rhonchi  ABDOMEN: Appears non-distended. No obvious abdominal masses. EXTREMITIES: No clubbing or cyanosis. No pitting edema.  Distal pedal pulses are 2+ bilaterally.   Assessment and Plan:   1. CAD - Prior Coronary CT in 01/2019 showed mild disease and recent Coronary CT earlier this month showed only 0 to 24% stenosis along the left main, proximal/mid LAD and proximal LCx. - Reviewed the report in detail with the patient today and his cardiac workup thus far has been reassuring and his chest pain is likely not due to a cardiac etiology. Will plan for PFT's as outlined below. He does report having a history of esophageal dysmotility/stricture in the past and required dilation as well. Would recommend reevaluation by GI if symptoms persist. He will review with his PCP.  2. HFimpEF - His ejection fraction was at 50 to 55% by echocardiogram in 05/2022. He appears euvolemic by examination today. He is on Coreg 12.5 mg twice daily and Imdur 30 mg daily. BP has not allowed for further titration of medical therapy.  3. Palpitations - Recent monitor in 04/2023 showed predominantly  normal sinus rhythm with rare PAC's and PVC's representing less than 1% of total beats. He reports symptoms have improved. We did review that he should try reducing his caffeine intake.   - Continue current medical therapy with Coreg 12.5 mg twice daily.   4. HLD - Followed by his PCP. Continue current medical therapy with Atorvastatin 20 mg daily.  5. Dilated Aortic Root - This measured 41 mm by recent CT. Would plan for follow-up imaging in 1 year for reassessment.  6. Dyspnea on Exertion - He reports having dyspnea on exertion and cardiac workup was reassuring as outlined above.  Recent CT was also negative for a PE. Given his history of tobacco use and most recent PFT's being in 2012, will arrange for follow-up PFT's. If abnormal, would refer to Pulmonology and this was reviewed today.  Signed, Ellsworth Lennox, PA-C

## 2023-05-27 NOTE — Patient Instructions (Signed)
Medication Instructions:   Continue current medication regimen.    *If you need a refill on your cardiac medications before your next appointment, please call your pharmacy*   Testing/Procedures:  Pulmonary Function Tests - Based off results, we will refer you to Pulmonology if indicated.    Follow-Up: At [facility], you and your health needs are our priority.  As part of our continuing mission to provide you with exceptional heart care, we have created designated Provider Care Teams.  These Care Teams include your primary Cardiologist (physician) and Advanced Practice Providers (APPs -  Physician Assistants and Nurse Practitioners) who all work together to provide you with the care you need, when you need it.  We recommend signing up for the patient portal called "MyChart".  Sign up information is provided on this After Visit Summary.  MyChart is used to connect with patients for Virtual Visits (Telemedicine).  Patients are able to view lab/test results, encounter notes, upcoming appointments, etc.  Non-urgent messages can be sent to your provider as well.   To learn more about what you can do with MyChart, go to [email or web address].    Your next appointment:   6 month(s)  Provider:   You may see Charlton Haws, MD or one of the following Advanced Practice Providers on your designated Care Team:   Marquez, PA-C  Jacolyn Reedy, [place]

## 2023-05-28 ENCOUNTER — Encounter: Payer: Self-pay | Admitting: Student

## 2023-05-30 ENCOUNTER — Other Ambulatory Visit (INDEPENDENT_AMBULATORY_CARE_PROVIDER_SITE_OTHER): Payer: Medicare Other

## 2023-05-30 ENCOUNTER — Ambulatory Visit: Payer: Medicare Other | Admitting: Orthopedic Surgery

## 2023-05-30 ENCOUNTER — Encounter: Payer: Self-pay | Admitting: Orthopedic Surgery

## 2023-05-30 DIAGNOSIS — M79672 Pain in left foot: Secondary | ICD-10-CM

## 2023-05-30 DIAGNOSIS — L97521 Non-pressure chronic ulcer of other part of left foot limited to breakdown of skin: Secondary | ICD-10-CM | POA: Diagnosis not present

## 2023-05-30 DIAGNOSIS — L03116 Cellulitis of left lower limb: Secondary | ICD-10-CM | POA: Diagnosis not present

## 2023-05-30 DIAGNOSIS — L97511 Non-pressure chronic ulcer of other part of right foot limited to breakdown of skin: Secondary | ICD-10-CM | POA: Diagnosis not present

## 2023-05-30 MED ORDER — DOXYCYCLINE HYCLATE 100 MG PO TABS
100.0000 mg | ORAL_TABLET | Freq: Two times a day (BID) | ORAL | 0 refills | Status: DC
Start: 2023-05-30 — End: 2023-07-25

## 2023-05-30 NOTE — Progress Notes (Signed)
Office Visit Note   Patient: Mike Floyd           Date of Birth: Jun 01, 1961           MRN: [id number] Visit Date: 05/30/2023              Requested by: Elfredia Nevins, MD [address] [place],  [place] [address] PCP: Elfredia Nevins, MD  Chief Complaint  Patient presents with   Left Foot - Follow-up      HPI: Patient is a 62 year old gentleman who is status post left transmetatarsal amputation.  Patient reports a history of new cellulitis and ulceration across the mid aspect of the residual limb.  Patient also presents with ulcerations of the right foot x 2.  Assessment & Plan: Visit Diagnoses:  1. Pain in left foot   2. Non-pressure chronic ulcer of other part of right foot limited to breakdown of skin (HCC)   3. Non-pressure chronic ulcer of other part of left foot limited to breakdown of skin (HCC)     Plan: Prescription called in for doxycycline, ulcers debrided x 3, MRI ordered of the left foot to rule out osteomyelitis.  Follow-Up Instructions: Return in about 2 weeks (around 06/13/2023).   Ortho Exam  Patient is alert, oriented, no adenopathy, well-dressed, normal affect, normal respiratory effort. Examination patient has a palpable pulse bilaterally.  Examination of the left foot the transmetatarsal amputation has a ulcer over the residual limb with surrounding cellulitis.  After informed consent a 10 blade knife was used to debride the skin and soft tissue back to healthy viable bleeding granulation tissue this was touched with silver nitrate.  After debridement of the left foot the ulcer is 2 x 3 cm.  Examination the right foot patient has a Wagner grade 1 ulcer midfoot medial column as well as beneath the second metatarsal head.  After informed consent a 10 blade knife was used to debride the skin and soft tissue back to healthy viable tissue.  The metatarsal ulcer is 2 cm in diameter the midfoot ulcer is 3 cm in diameter after debridement.  Imaging: XR Foot  Complete Left  Result Date: 05/30/2023 Three-view radiographs of the left foot shows destructive changes of the residual third metatarsal with concern for osteomyelitis.  No images are attached to the encounter.  Labs: Lab Results  Component Value Date   HGBA1C 9.7 (H) 05/13/2022   HGBA1C 7.0 (H) 06/15/2017   HGBA1C 5.2 03/15/2013   ESRSEDRATE 2 03/14/2023   CRP <1 03/14/2023   CRP 0.6 05/16/2022   CRP 2.2 06/27/2019   LABURIC 5.5 03/07/2012   REPTSTATUS 05/18/2022 FINAL 05/13/2022   CULT  05/13/2022    NO GROWTH 5 DAYS Performed at [facility], [address] [place], [place] [address]      Lab Results  Component Value Date   ALBUMIN 4.4 05/04/2023   ALBUMIN 4.1 02/26/2023   ALBUMIN 4.2 05/13/2022    Lab Results  Component Value Date   MG 1.8 02/26/2023   MG 1.7 05/20/2022   MG 2.0 05/19/2022   Lab Results  Component Value Date   VD25OH 18 (L) 06/15/2017    No results found for: "PREALBUMIN"    Latest Ref Rng & Units 05/04/2023    9:47 AM 02/26/2023   11:08 AM 02/26/2023   11:03 AM  CBC EXTENDED  WBC 4.0 - 10.5 K/uL 6.1   5.1   RBC 4.22 - 5.81 MIL/uL 4.86   4.91  Hemoglobin 13.0 - 17.0 g/dL 82.9  56.2  13.0   HCT 39.0 - 52.0 % 41.3  41.0  42.0   Platelets 150 - 400 K/uL 162   140   NEUT# 1.7 - 7.7 K/uL 4.5   3.9   Lymph# 0.7 - 4.0 K/uL 1.0   0.8      There is no height or weight on file to calculate BMI.  Orders:  Orders Placed This Encounter  Procedures   XR Foot Complete Left   No orders of the defined types were placed in this encounter.    Procedures: No procedures performed  Clinical Data: No additional findings.  ROS:  All other systems negative, except as noted in the HPI. Review of Systems  Objective: Vital Signs: There were no vitals taken for this visit.  Specialty Comments:  No specialty comments available.  PMFS History: Patient Active Problem List   Diagnosis Date Noted   Acute osteomyelitis of metatarsal bone of  left foot (HCC)    Subacute osteomyelitis, left ankle and foot (HCC) 05/13/2022   Non-pressure chronic ulcer of other part of right foot limited to breakdown of skin (HCC)    Contracture of right Achilles tendon    Mildly dilatd aortic root (HCC)    Contracture of left Achilles tendon    Metatarsal deformity, left    CAD (coronary artery disease)    Hallux hammertoe, left 12/11/2019   Diarrhea 06/27/2019   Diastasis recti 08/22/2018   Umbilical hernia without obstruction and without gangrene 08/22/2018   Vitamin D deficiency 06/23/2017   Essential hypertension, benign 06/15/2017   Mixed hyperlipidemia 06/15/2017   Class 1 obesity due to excess calories with serious comorbidity and body mass index (BMI) of 31.0 to 31.9 in adult 06/15/2017   Malfunction of implantable cardioverter-defibrillator (ICD) electrode 10/07/2016   Chronic kidney disease (CKD), stage III (moderate) (HCC) 04/04/2015   Bladder neck obstruction 03/21/2015   Difficult or painful urination 03/21/2015   Flank pain 03/21/2015   Delayed onset of urination 03/21/2015   Calculus of kidney 03/21/2015   Erosive esophagitis 12/27/2013   Hypotension due to drugs 07/02/2013   Chronic systolic heart failure (HCC) 04/24/2013   CAP (community acquired pneumonia) 03/16/2013   HTN (hypertension) 03/15/2013   Hydrocele 03/15/2013   Spermatocele 03/15/2013   DOE (dyspnea on exertion) 03/15/2013   OSA (obstructive sleep apnea) 01/09/2013   Chronic kidney disease, stage 3 (HCC) 01/03/2013   Chest pain 12/26/2012   History of diagnostic tests 09/06/2012   TIA (transient ischemic attack)    Cardiomyopathy, nonischemic (HCC) 02/24/2010   AICD (automatic cardioverter/defibrillator) 02/24/2010   DM type 2 causing vascular disease (HCC) 06/27/2009   Gout 06/27/2009   Tobacco abuse, in remission 06/27/2009   COLONIC POLYPS 10/31/2008   GERD (gastroesophageal reflux disease) 10/31/2008   Past Medical History:  Diagnosis Date    AICD (automatic cardioverter/defibrillator) present 11/2007   a. 11/2007 SJM Current VR - single lead ICD  - Removed 2018 - "it was burning me"   Anxiety    CAD (coronary artery disease)    non-obstructive CAD by Cor CT in 2020 // Myoview 3/22: EF 57, small inf-sept defect c/w scar, no ischemia; low risk      Chest pain    a. 10/2007 Cath:  normal Cors.   CKD (chronic kidney disease), stage II    DDD (degenerative disc disease), lumbar    Diabetes mellitus DX: 2010   Erosive esophagitis    a. per  EGD (08/2011), Dr. Karilyn Cota - Erosive reflux esophagitis improved but not completely healed since previous EGD 3 years ago. Bx showing  ulcerated gatroesophageal junction mucosa. negative for H. pylori   GERD (gastroesophageal reflux disease)    Gout    Hearing deficit    a. wear bilateral hearing aides   History of hiatal hernia    Hypertension    Mildly dilatd aortic root (HCC)    CMR 4/22: EF 52, no LGE; d/w Dr. Lorretta Harp root 38 mm (mildly dilated)   Myocardial infarction [facility]) 2011   Neuropathy    Feet and legs   Nonischemic dilated cardiomyopathy (HCC)    a. H/O EF as low as 35-40% by LV gram 10/2007;  b. Echo 02/2011 EF 50-55%, inf HK, Gr 1 DD // CMR 4/22: EF 52, no LGE; d/w Dr. Lorretta Harp root 38 mm (mildly dilated)    Renal insufficiency    Sleep apnea    pt doesnt use, states"I cant afford one". PCP aware   Stroke (HCC)    mini-stroke in 2014   TIA (transient ischemic attack)    July, 2013   Tobacco abuse, in remission 06/27/2009   Discontinued in 2009     Wears dentures    top plate   WPW (Wolff-Parkinson-White syndrome)    a. s/p RFCA @ [facility] - 1999    Family History  Problem Relation Age of Onset   Heart attack Mother 82   Hypertension Mother    Diabetes Mother    Kidney disease Mother    Breast cancer Mother    Heart attack Father 32   Hypertension Father    Diabetes Father    Stroke Brother    Heart attack Brother 13   Stroke Maternal Grandmother 78   Diabetes  Brother    Hypertension Brother     Past Surgical History:  Procedure Laterality Date   AMPUTATION Left 05/19/2022   Procedure: TRANSMETATARSAL AMPUTATION LEFT FOOT;  Surgeon: Nadara Mustard, MD;  Location: [facility] OR;  Service: Orthopedics;  Laterality: Left;   APPENDECTOMY     BACK SURGERY     1995   CARDIAC CATHETERIZATION     2009   CARDIAC DEFIBRILLATOR PLACEMENT     CHOLECYSTECTOMY     CHOLECYSTECTOMY, LAPAROSCOPIC     11/2007   COLONOSCOPY W/ POLYPECTOMY  2009   ELBOW SURGERY Left 06/2010   ESOPHAGEAL DILATION N/A 11/08/2014   Procedure: ESOPHAGEAL DILATION;  Surgeon: Malissa Hippo, MD;  Location: [facility] ORS;  Service: Endoscopy;  Laterality: N/A;  #56,    ESOPHAGOGASTRODUODENOSCOPY  03/31/2012   also 08/2011; [name]   ESOPHAGOGASTRODUODENOSCOPY (EGD) WITH PROPOFOL N/A 11/08/2014   Procedure: ESOPHAGOGASTRODUODENOSCOPY (EGD) WITH PROPOFOL;  Surgeon: Malissa Hippo, MD;  Location: [facility] ORS;  Service: Endoscopy;  Laterality: N/A;  [place] is 84 , [place] is 37   FOOT ARTHRODESIS Left 10/24/2020   Procedure: LEFT GASTROCNEMIUS RECESSION, DORSIFLEXION OSTEOTOMY 1ST MT;  Surgeon: Nadara Mustard, MD;  Location: [facility] OR;  Service: Orthopedics;  Laterality: Left;   FOOT ARTHRODESIS Right 01/09/2021   Procedure: CLOSING WEDGE OSTEOTOMY RIGHT 1ST METATARSAL;  Surgeon: Nadara Mustard, MD;  Location: [facility] OR;  Service: Orthopedics;  Laterality: Right;   GASTROCNEMIUS RECESSION Right 01/09/2021   Procedure: RIGHT GASTROCNEMIUS RECESSION;  Surgeon: Nadara Mustard, MD;  Location: [facility] OR;  Service: Orthopedics;  Laterality: Right;   ICD LEAD REMOVAL N/A 10/07/2016   Procedure: ICD LEAD REMOVAL ;  Surgeon: Marinus Maw, MD;  Location: [facility] OR;  Service: Cardiovascular;  Laterality: N/A;   MULTIPLE TOOTH EXTRACTIONS     RADIOFREQUENCY ABLATION  1999   WPW; performed at [facility]   TEE WITHOUT CARDIOVERSION N/A 10/07/2016   Procedure: TRANSESOPHAGEAL ECHOCARDIOGRAM (TEE);  Surgeon: Marinus Maw, MD;  Location: [facility]  OR;  Service: Cardiovascular;  Laterality: N/A;   Social History   Occupational History   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    Comment: Laid off in 11/11  Tobacco Use   Smoking status: Former    Current packs/day: 0.00    Average packs/day: 1.5 packs/day for 39.1 years (58.6 ttl pk-yrs)    Types: Cigarettes    Start date: 06/30/1971    Quit date: 07/30/2010    Years since quitting: 12.8   Smokeless tobacco: Former    Types: Chew    Quit date: 03/26/1994  Vaping Use   Vaping status: Former   Start date: 07/30/2010   Quit date: 06/29/2016  Substance and Sexual Activity   Alcohol use: No    Alcohol/week: 1.0 standard drink of alcohol    Types: 1 Cans of beer per week    Comment: used "years ago"   Drug use: No   Sexual activity: Never    Birth control/protection: None

## 2023-06-03 DIAGNOSIS — E1165 Type 2 diabetes mellitus with hyperglycemia: Secondary | ICD-10-CM | POA: Diagnosis not present

## 2023-06-03 DIAGNOSIS — E08621 Diabetes mellitus due to underlying condition with foot ulcer: Secondary | ICD-10-CM | POA: Diagnosis not present

## 2023-06-11 ENCOUNTER — Ambulatory Visit (HOSPITAL_COMMUNITY)
Admission: RE | Admit: 2023-06-11 | Discharge: 2023-06-11 | Disposition: A | Discharge status: Home / Self Care | Payer: Medicare Other | Source: Ambulatory Visit | Attending: Orthopedic Surgery | Admitting: Orthopedic Surgery

## 2023-06-11 DIAGNOSIS — L03116 Cellulitis of left lower limb: Secondary | ICD-10-CM | POA: Diagnosis not present

## 2023-06-11 DIAGNOSIS — M869 Osteomyelitis, unspecified: Secondary | ICD-10-CM | POA: Diagnosis not present

## 2023-06-11 DIAGNOSIS — L0390 Cellulitis, unspecified: Secondary | ICD-10-CM | POA: Diagnosis not present

## 2023-06-11 DIAGNOSIS — M65942 Unspecified synovitis and tenosynovitis, left hand: Secondary | ICD-10-CM | POA: Diagnosis not present

## 2023-06-11 DIAGNOSIS — M129 Arthropathy, unspecified: Secondary | ICD-10-CM | POA: Diagnosis not present

## 2023-06-13 ENCOUNTER — Encounter: Payer: Self-pay | Admitting: Orthopedic Surgery

## 2023-06-13 ENCOUNTER — Ambulatory Visit: Payer: Medicare Other | Admitting: Orthopedic Surgery

## 2023-06-13 DIAGNOSIS — M86272 Subacute osteomyelitis, left ankle and foot: Secondary | ICD-10-CM

## 2023-06-13 NOTE — Progress Notes (Signed)
Office Visit Note   Patient: Mike Floyd           Date of Birth: [birth year]           MRN: [id number] Visit Date: 06/13/2023              Requested by: Elfredia Nevins, MD [address] [place],  [place] [address] PCP: Elfredia Nevins, MD  Chief Complaint  Patient presents with   Left Foot - Follow-up      HPI Patient is a 62 year old gentleman who presents in follow-up for a left transmetatarsal amputation that is 1 year from surgery.  Patient reports cellulitis ulceration and drainage.  Assessment & Plan: Visit Diagnoses:  1. Subacute osteomyelitis of left foot (HCC)     Plan: With the osteomyelitis involving the midfoot.  Patient does not have further foot salvage intervention options available.  Will plan for a transtibial amputation on the left on Friday.  Discharge planning based on therapy recommendations.  Follow-Up Instructions: No follow-ups on file.   Ortho Exam  Patient is alert, oriented, no adenopathy, well-dressed, normal affect, normal respiratory effort. Examination patient has an ulcer over the residual limb of the transmetatarsal amputation.  This probes down to the third and fourth metatarsals.  Patient has cellulitis involving the midfoot.  Review of the MRI scan shows osteomyelitis of the third and fourth metatarsals.  There is edema at the base of the first and second metatarsals.  Right foot has callused areas but no cellulitis or open ulcers.  Imaging: No results found.   Labs: Lab Results  Component Value Date   HGBA1C 9.7 (H) 05/13/2022   HGBA1C 7.0 (H) 06/15/2017   HGBA1C 5.2 03/15/2013   ESRSEDRATE 2 03/14/2023   CRP <1 03/14/2023   CRP 0.6 05/16/2022   CRP 2.2 06/27/2019   LABURIC 5.5 03/07/2012   REPTSTATUS 05/18/2022 FINAL 05/13/2022   CULT  05/13/2022    NO GROWTH 5 DAYS Performed at [facility], [address], [place], [place] [address]      Lab Results  Component Value Date   ALBUMIN 4.4 05/04/2023   ALBUMIN 4.1  02/26/2023   ALBUMIN 4.2 05/13/2022    Lab Results  Component Value Date   MG 1.8 02/26/2023   MG 1.7 05/20/2022   MG 2.0 05/19/2022   Lab Results  Component Value Date   VD25OH 18 (L) 06/15/2017    No results found for: "PREALBUMIN"    Latest Ref Rng & Units 05/04/2023    9:47 AM 02/26/2023   11:08 AM 02/26/2023   11:03 AM  CBC EXTENDED  WBC 4.0 - 10.5 K/uL 6.1   5.1   RBC 4.22 - 5.81 MIL/uL 4.86   4.91   Hemoglobin 13.0 - 17.0 g/dL 32.4  40.1  02.7   HCT 39.0 - 52.0 % 41.3  41.0  42.0   Platelets 150 - 400 K/uL 162   140   NEUT# 1.7 - 7.7 K/uL 4.5   3.9   Lymph# 0.7 - 4.0 K/uL 1.0   0.8      There is no height or weight on file to calculate BMI.  Orders:  No orders of the defined types were placed in this encounter.  No orders of the defined types were placed in this encounter.    Procedures: No procedures performed  Clinical Data: No additional findings.  ROS:  All other systems negative, except as noted in the HPI. Review of Systems  Objective: Vital Signs:  There were no vitals taken for this visit.  Specialty Comments:  No specialty comments available.  PMFS History: Patient Active Problem List   Diagnosis Date Noted   Acute osteomyelitis of metatarsal bone of left foot (HCC)    Subacute osteomyelitis, left ankle and foot (HCC) 05/13/2022   Non-pressure chronic ulcer of other part of right foot limited to breakdown of skin (HCC)    Contracture of right Achilles tendon    Mildly dilatd aortic root (HCC)    Contracture of left Achilles tendon    Metatarsal deformity, left    CAD (coronary artery disease)    Hallux hammertoe, left 12/11/2019   Diarrhea 06/27/2019   Diastasis recti 08/22/2018   Umbilical hernia without obstruction and without gangrene 08/22/2018   Vitamin D deficiency 06/23/2017   Essential hypertension, benign 06/15/2017   Mixed hyperlipidemia 06/15/2017   Class 1 obesity due to excess calories with serious comorbidity and  body mass index (BMI) of 31.0 to 31.9 in adult 06/15/2017   Malfunction of implantable cardioverter-defibrillator (ICD) electrode 10/07/2016   Chronic kidney disease (CKD), stage III (moderate) (HCC) 04/04/2015   Bladder neck obstruction 03/21/2015   Difficult or painful urination 03/21/2015   Flank pain 03/21/2015   Delayed onset of urination 03/21/2015   Calculus of kidney 03/21/2015   Erosive esophagitis 12/27/2013   Hypotension due to drugs 07/02/2013   Chronic systolic heart failure (HCC) 04/24/2013   CAP (community acquired pneumonia) 03/16/2013   HTN (hypertension) 03/15/2013   Hydrocele 03/15/2013   Spermatocele 03/15/2013   DOE (dyspnea on exertion) 03/15/2013   OSA (obstructive sleep apnea) 01/09/2013   Chronic kidney disease, stage 3 (HCC) 01/03/2013   Chest pain 12/26/2012   History of diagnostic tests 09/06/2012   TIA (transient ischemic attack)    Cardiomyopathy, nonischemic (HCC) 02/24/2010   AICD (automatic cardioverter/defibrillator) 02/24/2010   DM type 2 causing vascular disease (HCC) 06/27/2009   Gout 06/27/2009   Tobacco abuse, in remission 06/27/2009   COLONIC POLYPS 10/31/2008   GERD (gastroesophageal reflux disease) 10/31/2008   Past Medical History:  Diagnosis Date   AICD (automatic cardioverter/defibrillator) present 11/2007   a. 11/2007 SJM Current VR - single lead ICD  - Removed 2018 - "it was burning me"   Anxiety    CAD (coronary artery disease)    non-obstructive CAD by Cor CT in 2020 // Myoview 3/22: EF 57, small inf-sept defect c/w scar, no ischemia; low risk      Chest pain    a. 10/2007 Cath:  normal Cors.   CKD (chronic kidney disease), stage II    DDD (degenerative disc disease), lumbar    Diabetes mellitus DX: 2010   Erosive esophagitis    a. per EGD (08/2011), Dr. Karilyn Cota - Erosive reflux esophagitis improved but not completely healed since previous EGD 3 years ago. Bx showing  ulcerated gatroesophageal junction mucosa. negative for H.  pylori   GERD (gastroesophageal reflux disease)    Gout    Hearing deficit    a. wear bilateral hearing aides   History of hiatal hernia    Hypertension    Mildly dilatd aortic root (HCC)    CMR 4/22: EF 52, no LGE; d/w Dr. Lorretta Harp root 38 mm (mildly dilated)   Myocardial infarction [facility]) 2011   Neuropathy    Feet and legs   Nonischemic dilated cardiomyopathy (HCC)    a. H/O EF as low as 35-40% by LV gram 10/2007;  b. Echo 02/2011 EF 50-55%, inf HK, Gr  1 DD // CMR 4/22: EF 52, no LGE; d/w Dr. Lorretta Harp root 38 mm (mildly dilated)    Renal insufficiency    Sleep apnea    pt doesnt use, states"I cant afford one". PCP aware   Stroke (HCC)    mini-stroke in 2014   TIA (transient ischemic attack)    July, 2013   Tobacco abuse, in remission 06/27/2009   Discontinued in 2009     Wears dentures    top plate   WPW (Wolff-Parkinson-White syndrome)    a. s/p RFCA @ [facility] - 1999    Family History  Problem Relation Age of Onset   Heart attack Mother 81   Hypertension Mother    Diabetes Mother    Kidney disease Mother    Breast cancer Mother    Heart attack Father 37   Hypertension Father    Diabetes Father    Stroke Brother    Heart attack Brother 13   Stroke Maternal Grandmother 29   Diabetes Brother    Hypertension Brother     Past Surgical History:  Procedure Laterality Date   AMPUTATION Left 05/19/2022   Procedure: TRANSMETATARSAL AMPUTATION LEFT FOOT;  Surgeon: Nadara Mustard, MD;  Location: [facility] OR;  Service: Orthopedics;  Laterality: Left;   APPENDECTOMY     BACK SURGERY     1995   CARDIAC CATHETERIZATION     2009   CARDIAC DEFIBRILLATOR PLACEMENT     CHOLECYSTECTOMY     CHOLECYSTECTOMY, LAPAROSCOPIC     11/2007   COLONOSCOPY W/ POLYPECTOMY  2009   ELBOW SURGERY Left 06/2010   ESOPHAGEAL DILATION N/A 11/08/2014   Procedure: ESOPHAGEAL DILATION;  Surgeon: Malissa Hippo, MD;  Location: [facility] ORS;  Service: Endoscopy;  Laterality: N/A;  #56,     ESOPHAGOGASTRODUODENOSCOPY  03/31/2012   also 08/2011; [name]   ESOPHAGOGASTRODUODENOSCOPY (EGD) WITH PROPOFOL N/A 11/08/2014   Procedure: ESOPHAGOGASTRODUODENOSCOPY (EGD) WITH PROPOFOL;  Surgeon: Malissa Hippo, MD;  Location: [facility] OR[facility];  Service: Endoscopy;  Laterality: N/A;  [place] is 41 , [place] is 37   FOOT ARTHRODESIS Left 10/24/2020   Procedure: LEFT GASTROCNEMIUS RECESSION, DORSIFLEXION OSTEOTOMY 1ST MT;  Surgeon: Nadara Mustard, MD;  Location: [facility] OR;  Service: Orthopedics;  Laterality: Left;   FOOT ARTHRODESIS Right 01/09/2021   Procedure: CLOSING WEDGE OSTEOTOMY RIGHT 1ST METATARSAL;  Surgeon: Nadara Mustard, MD;  Location: [facility] OR;  Service: Orthopedics;  Laterality: Right;   GASTROCNEMIUS RECESSION Right 01/09/2021   Procedure: RIGHT GASTROCNEMIUS RECESSION;  Surgeon: Nadara Mustard, MD;  Location: [facility] OR;  Service: Orthopedics;  Laterality: Right;   ICD LEAD REMOVAL N/A 10/07/2016   Procedure: ICD LEAD REMOVAL ;  Surgeon: Marinus Maw, MD;  Location: [facility] OR;  Service: Cardiovascular;  Laterality: N/A;   MULTIPLE TOOTH EXTRACTIONS     RADIOFREQUENCY ABLATION  1999   WPW; performed at [facility]   TEE WITHOUT CARDIOVERSION N/A 10/07/2016   Procedure: TRANSESOPHAGEAL ECHOCARDIOGRAM (TEE);  Surgeon: Marinus Maw, MD;  Location: [facility] OR;  Service: Cardiovascular;  Laterality: N/A;   Social History   Occupational History   Occupation: Worked for [employer]    Employer: [employer]    Comment: Laid off in 11/11  Tobacco Use   Smoking status: Former    Current packs/day: 0.00    Average packs/day: 1.5 packs/day for 39.1 years (58.6 ttl pk-yrs)    Types: Cigarettes    Start date: 06/30/1971    Quit date: 07/30/2010  Years since quitting: 12.8   Smokeless tobacco: Former    Types: Chew    Quit date: 03/26/1994  Vaping Use   Vaping status: Former   Start date: 07/30/2010   Quit date: 06/29/2016  Substance and Sexual Activity   Alcohol use: No    Alcohol/week: 1.0 standard drink of  alcohol    Types: 1 Cans of beer per week    Comment: used "years ago"   Drug use: No   Sexual activity: Never    Birth control/protection: None

## 2023-06-16 ENCOUNTER — Encounter (HOSPITAL_COMMUNITY): Payer: Self-pay | Admitting: Orthopedic Surgery

## 2023-06-16 ENCOUNTER — Telehealth: Payer: Self-pay | Admitting: Orthopedic Surgery

## 2023-06-16 ENCOUNTER — Other Ambulatory Visit: Payer: Self-pay

## 2023-06-16 NOTE — Anesthesia Preprocedure Evaluation (Addendum)
Anesthesia Evaluation  Patient identified by MRN, date of birth, ID band Patient awake    Reviewed: Allergy & Precautions, H&P , NPO status , Patient's Chart, lab work & pertinent test results  Airway Mallampati: II  TM Distance: >3 FB Neck ROM: full    Dental  (+) Edentulous Upper, Edentulous Lower   Pulmonary sleep apnea , former smoker   Pulmonary exam normal breath sounds clear to auscultation       Cardiovascular hypertension, + CAD, + Past MI and + DOE  Normal cardiovascular exam+ Cardiac Defibrillator  Rhythm:regular Rate:Normal     Neuro/Psych  PSYCHIATRIC DISORDERS Anxiety     TIACVA    GI/Hepatic hiatal hernia, PUD,GERD  ,,  Endo/Other  diabetes, Type 2    Renal/GU Renal InsufficiencyRenal disease     Musculoskeletal  (+) Arthritis , Osteoarthritis,    Abdominal   Peds  Hematology   Anesthesia Other Findings   Reproductive/Obstetrics                             Anesthesia Physical Anesthesia Plan  ASA: 3  Anesthesia Plan: General   Post-op Pain Management: Regional block*   Induction: Intravenous  PONV Risk Score and Plan: 2 and Ondansetron, Midazolam and Treatment may vary due to age or medical condition  Airway Management Planned: LMA  Additional Equipment:   Intra-op Plan:   Post-operative Plan: Extubation in OR  Informed Consent: I have reviewed the patients History and Physical, chart, labs and discussed the procedure including the risks, benefits and alternatives for the proposed anesthesia with the patient or authorized representative who has indicated his/her understanding and acceptance.     Dental advisory given  Plan Discussed with: CRNA, Anesthesiologist and Surgeon  Anesthesia Plan Comments: (PAT note by Antionette Poles, PA-C:  62 year old male follows with cardiology for history of nonobstructive CAD, CVA, HTN, HLD, WPW (s/p RFA in 1999), history of  ICD (placed in 2009 but removed in 2018), heart failure with improved EF (EF 50 to 55% by echo 05/2022), palpitations, HLD, mildly dilated aortic root, DOE.  Coronary CTA 05/2023 showed only 0 to 24% stenosis along the left main, proximal/mid LAD, and proximal left circumflex.  Recent chest pains were felt to be noncardiac, possibly secondary to history of esophageal dysmotility/stricture.  Recent monitor in September 2024 showed predominantly NSR with rare PACs and PVCs.  Other pertinent history includes CKD 2, OSA, GERD, hiatal hernia, IDDM2 (last A1c in epic 9.7 on 05/13/2022).  Patient will need day of surgery labs and evaluation.  EKG 05/04/2023: Sinus rhythm.  Rate 94. Minimal ST elevation, anterior leads.  No significant change.  Event Monitor: 04/2023 Patch Wear Time:  2 days and 3 hours (2024-09-05T10:55:58-0400 to 2024-09-07T14:05:19-0400)  Patient had a min HR of 63 bpm, max HR of 120 bpm, and avg HR of 86 bpm. Predominant underlying rhythm was Sinus Rhythm. Isolated SVEs were rare (<1.0%), SVE Couplets were rare (<1.0%), and SVE Triplets were rare (<1.0%). No Isolated VEs, VE Couplets, or  VE Triplets were present.   Coronary CT: 05/2023 IMPRESSION: 1. Coronary calcium score of 119. This was 63rd percentile for age and sex matched control.  2. Total plaque volume 167mm3 which is 30th percentile for age and sex-matched controls (calcified plaque 69mm3; noncalcified plaque 150mm3). TPV is moderate  3.  Normal coronary origin with right dominance.  4.  Nonobstructive CAD  5. Minimal (0-24%) stenosis in left main, proximal/mid LAD,  and proximal LCX  6.  Dilated aortic root measuring 41mm  CAD-RADS 1. Minimal non-obstructive CAD (0-24%). Consider non-atherosclerotic causes of chest pain. Consider preventive therapy and risk factor modification.  TTE 05/16/2022: 1. Left ventricular ejection fraction, by estimation, is 50 to 55%. The  left ventricle has low normal  function. The left ventricle has no regional  wall motion abnormalities. Left ventricular diastolic parameters are  consistent with Grade I diastolic  dysfunction (impaired relaxation).  2. Right ventricular systolic function is normal. The right ventricular  size is normal. Tricuspid regurgitation signal is inadequate for assessing  PA pressure.  3. The mitral valve is normal in structure. No evidence of mitral valve  regurgitation.  4. The aortic valve is tricuspid. Aortic valve regurgitation is not  visualized. No aortic stenosis is present.  5. Aortic dilatation noted. There is borderline dilatation of the aortic  root, measuring 39 mm.   Comparison(s): Prior images reviewed side by side. The left ventricular  function has improved.     )        Anesthesia Quick Evaluation

## 2023-06-16 NOTE — Telephone Encounter (Signed)
Called pt and advised per Dr. Lajoyce Corners ok not to take aspirin tomorrow. Voiced understanding. Sch for BKA tomorrow morning.

## 2023-06-16 NOTE — Progress Notes (Signed)
Anesthesia Chart Review: Same day workup  62 year old male follows with cardiology for history of nonobstructive CAD, CVA, HTN, HLD, WPW (s/p RFA in 1999), history of ICD (placed in 2009 but removed in 2018), heart failure with improved EF (EF 50 to 55% by echo 05/2022), palpitations, HLD, mildly dilated aortic root, DOE.  Coronary CTA 05/2023 showed only 0 to 24% stenosis along the left main, proximal/mid LAD, and proximal left circumflex.  Recent chest pains were felt to be noncardiac, possibly secondary to history of esophageal dysmotility/stricture.  Recent monitor in September 2024 showed predominantly NSR with rare PACs and PVCs.  Other pertinent history includes CKD 2, OSA, GERD, hiatal hernia, IDDM2 (last A1c in epic 9.7 on 05/13/2022).  Patient will need day of surgery labs and evaluation.  EKG 05/04/2023: Sinus rhythm.  Rate 94. Minimal ST elevation, anterior leads.  No significant change.  Event Monitor: 04/2023 Patch Wear Time:  2 days and 3 hours (2024-09-05T10:55:58-0400 to 2024-09-07T14:05:19-0400)   Patient had a min HR of 63 bpm, max HR of 120 bpm, and avg HR of 86 bpm. Predominant underlying rhythm was Sinus Rhythm. Isolated SVEs were rare (<1.0%), SVE Couplets were rare (<1.0%), and SVE Triplets were rare (<1.0%). No Isolated VEs, VE Couplets, or  VE Triplets were present.    Coronary CT: 05/2023 IMPRESSION: 1. Coronary calcium score of 119. This was 63rd percentile for age and sex matched control.   2. Total plaque volume 183mm3 which is 30th percentile for age and sex-matched controls (calcified plaque 25mm3; noncalcified plaque 17mm3). TPV is moderate   3.  Normal coronary origin with right dominance.   4.  Nonobstructive CAD   5. Minimal (0-24%) stenosis in left main, proximal/mid LAD, and proximal LCX   6.  Dilated aortic root measuring 41mm   CAD-RADS 1. Minimal non-obstructive CAD (0-24%). Consider non-atherosclerotic causes of chest pain. Consider  preventive therapy and risk factor modification.  TTE 05/16/2022:  1. Left ventricular ejection fraction, by estimation, is 50 to 55%. The  left ventricle has low normal function. The left ventricle has no regional  wall motion abnormalities. Left ventricular diastolic parameters are  consistent with Grade I diastolic  dysfunction (impaired relaxation).   2. Right ventricular systolic function is normal. The right ventricular  size is normal. Tricuspid regurgitation signal is inadequate for assessing  PA pressure.   3. The mitral valve is normal in structure. No evidence of mitral valve  regurgitation.   4. The aortic valve is tricuspid. Aortic valve regurgitation is not  visualized. No aortic stenosis is present.   5. Aortic dilatation noted. There is borderline dilatation of the aortic  root, measuring 39 mm.   Comparison(s): Prior images reviewed side by side. The left ventricular  function has improved.      Zannie Cove [facility]/Anesthesiology Phone [phone] 06/16/2023 10:08 AM

## 2023-06-16 NOTE — Progress Notes (Signed)
PCP - Dr Elfredia Nevins Cardiologist - Dr Charlton Haws  Chest x-ray - 05/04/23 EKG - 05/04/23 Stress Test - 10/13/20 ECHO - 05/16/22 Cardiac Cath - 2009   ICD Pacemaker/Loop - None (removed in 2018)  Sleep Study -  Yes CPAP - does not use CPAP  Diabetes Type 2 Fasting Blood Sugar - 230s Checks Blood Sugar 1-2 times a day  Do not take Novolog 70/30 Insulin on the morning of surgery.  THE NIGHT BEFORE SURGERY, take 35 Units of Novolog 70/30 Insulin. Take 10 units of Lantus Insulin.  If your blood sugar is less than 70 mg/dL, you will need to treat for low blood sugar: Treat a low blood sugar (less than 70 mg/dL) with  cup of clear juice (cranberry or apple), 4 glucose tablets, OR glucose gel. Recheck blood sugar in 15 minutes after treatment (to make sure it is greater than 70 mg/dL). If your blood sugar is not greater than 70 mg/dL on recheck, call [phone] for further instructions.  Aspirin Instructions: Follow your surgeon's instructions on when to stop aspirin prior to surgery,  If no instructions were given by your surgeon then you will need to call the office for those instructions.  ERAS Protcol - Clear liquids til 7 AM DOS.  Anesthesia review: Yes  STOP now taking any Aspirin (unless otherwise instructed by your surgeon), Aleve, Naproxen, Ibuprofen, Motrin, Advil, Goody's, BC's, all herbal medications, fish oil, and all vitamins.   Coronavirus Screening Do you have any of the following symptoms:  Cough yes/no: No Fever (>100.42F)  yes/no: No Runny nose yes/no: No Sore throat yes/no: No Difficulty breathing/shortness of breath  yes/no: No  Have you traveled in the last 14 days and where? yes/no: No  Patient and wife Mike Floyd verbalized understanding of instructions that were given via speaker phone.

## 2023-06-16 NOTE — Telephone Encounter (Signed)
Patient called and wanted to know if he needs to take his aspirin before surgery tomorrow?CB#[phone]

## 2023-06-17 ENCOUNTER — Inpatient Hospital Stay (HOSPITAL_COMMUNITY)
Admission: RE | Admit: 2023-06-17 | Discharge: 2023-06-21 | DRG: 464 | Disposition: A | Discharge status: Home Health | Payer: Medicare Other | Attending: Orthopedic Surgery | Admitting: Orthopedic Surgery

## 2023-06-17 ENCOUNTER — Other Ambulatory Visit: Payer: Self-pay

## 2023-06-17 ENCOUNTER — Encounter (HOSPITAL_COMMUNITY): Payer: Self-pay | Admitting: Orthopedic Surgery

## 2023-06-17 ENCOUNTER — Encounter (HOSPITAL_COMMUNITY)
Admission: RE | Disposition: A | Discharge status: Home Health | Payer: Self-pay | Source: Home / Self Care | Attending: Orthopedic Surgery

## 2023-06-17 ENCOUNTER — Inpatient Hospital Stay (HOSPITAL_COMMUNITY): Payer: Medicare Other | Admitting: Physician Assistant

## 2023-06-17 DIAGNOSIS — I70262 Atherosclerosis of native arteries of extremities with gangrene, left leg: Secondary | ICD-10-CM | POA: Diagnosis not present

## 2023-06-17 DIAGNOSIS — Z87891 Personal history of nicotine dependence: Secondary | ICD-10-CM | POA: Diagnosis not present

## 2023-06-17 DIAGNOSIS — N182 Chronic kidney disease, stage 2 (mild): Secondary | ICD-10-CM | POA: Diagnosis not present

## 2023-06-17 DIAGNOSIS — E1142 Type 2 diabetes mellitus with diabetic polyneuropathy: Secondary | ICD-10-CM | POA: Diagnosis present

## 2023-06-17 DIAGNOSIS — Y838 Other surgical procedures as the cause of abnormal reaction of the patient, or of later complication, without mention of misadventure at the time of the procedure: Secondary | ICD-10-CM | POA: Diagnosis present

## 2023-06-17 DIAGNOSIS — Z974 Presence of external hearing-aid: Secondary | ICD-10-CM | POA: Diagnosis not present

## 2023-06-17 DIAGNOSIS — K219 Gastro-esophageal reflux disease without esophagitis: Secondary | ICD-10-CM | POA: Diagnosis not present

## 2023-06-17 DIAGNOSIS — E1152 Type 2 diabetes mellitus with diabetic peripheral angiopathy with gangrene: Secondary | ICD-10-CM | POA: Diagnosis present

## 2023-06-17 DIAGNOSIS — M19072 Primary osteoarthritis, left ankle and foot: Secondary | ICD-10-CM | POA: Diagnosis not present

## 2023-06-17 DIAGNOSIS — Z95810 Presence of automatic (implantable) cardiac defibrillator: Secondary | ICD-10-CM

## 2023-06-17 DIAGNOSIS — I2510 Atherosclerotic heart disease of native coronary artery without angina pectoris: Secondary | ICD-10-CM | POA: Diagnosis not present

## 2023-06-17 DIAGNOSIS — E1122 Type 2 diabetes mellitus with diabetic chronic kidney disease: Secondary | ICD-10-CM | POA: Diagnosis not present

## 2023-06-17 DIAGNOSIS — M86272 Subacute osteomyelitis, left ankle and foot: Secondary | ICD-10-CM

## 2023-06-17 DIAGNOSIS — M869 Osteomyelitis, unspecified: Secondary | ICD-10-CM

## 2023-06-17 DIAGNOSIS — I420 Dilated cardiomyopathy: Secondary | ICD-10-CM | POA: Diagnosis not present

## 2023-06-17 DIAGNOSIS — F419 Anxiety disorder, unspecified: Secondary | ICD-10-CM | POA: Diagnosis present

## 2023-06-17 DIAGNOSIS — I5022 Chronic systolic (congestive) heart failure: Secondary | ICD-10-CM | POA: Diagnosis not present

## 2023-06-17 DIAGNOSIS — G8918 Other acute postprocedural pain: Secondary | ICD-10-CM | POA: Diagnosis not present

## 2023-06-17 DIAGNOSIS — M86172 Other acute osteomyelitis, left ankle and foot: Secondary | ICD-10-CM | POA: Diagnosis not present

## 2023-06-17 DIAGNOSIS — Z841 Family history of disorders of kidney and ureter: Secondary | ICD-10-CM

## 2023-06-17 DIAGNOSIS — T8781 Dehiscence of amputation stump: Secondary | ICD-10-CM | POA: Diagnosis not present

## 2023-06-17 DIAGNOSIS — M109 Gout, unspecified: Secondary | ICD-10-CM | POA: Diagnosis not present

## 2023-06-17 DIAGNOSIS — Z8249 Family history of ischemic heart disease and other diseases of the circulatory system: Secondary | ICD-10-CM | POA: Diagnosis not present

## 2023-06-17 DIAGNOSIS — Z833 Family history of diabetes mellitus: Secondary | ICD-10-CM | POA: Diagnosis not present

## 2023-06-17 DIAGNOSIS — N183 Chronic kidney disease, stage 3 unspecified: Secondary | ICD-10-CM | POA: Diagnosis not present

## 2023-06-17 DIAGNOSIS — I252 Old myocardial infarction: Secondary | ICD-10-CM | POA: Diagnosis not present

## 2023-06-17 DIAGNOSIS — I129 Hypertensive chronic kidney disease with stage 1 through stage 4 chronic kidney disease, or unspecified chronic kidney disease: Secondary | ICD-10-CM | POA: Diagnosis present

## 2023-06-17 DIAGNOSIS — Z9181 History of falling: Secondary | ICD-10-CM

## 2023-06-17 DIAGNOSIS — S88112A Complete traumatic amputation at level between knee and ankle, left lower leg, initial encounter: Principal | ICD-10-CM | POA: Diagnosis present

## 2023-06-17 DIAGNOSIS — I96 Gangrene, not elsewhere classified: Secondary | ICD-10-CM | POA: Diagnosis not present

## 2023-06-17 DIAGNOSIS — Z823 Family history of stroke: Secondary | ICD-10-CM

## 2023-06-17 DIAGNOSIS — E1169 Type 2 diabetes mellitus with other specified complication: Secondary | ICD-10-CM | POA: Diagnosis not present

## 2023-06-17 DIAGNOSIS — Z8673 Personal history of transient ischemic attack (TIA), and cerebral infarction without residual deficits: Secondary | ICD-10-CM | POA: Diagnosis not present

## 2023-06-17 DIAGNOSIS — L03116 Cellulitis of left lower limb: Secondary | ICD-10-CM | POA: Diagnosis not present

## 2023-06-17 DIAGNOSIS — I130 Hypertensive heart and chronic kidney disease with heart failure and stage 1 through stage 4 chronic kidney disease, or unspecified chronic kidney disease: Secondary | ICD-10-CM | POA: Diagnosis not present

## 2023-06-17 DIAGNOSIS — Z01818 Encounter for other preprocedural examination: Principal | ICD-10-CM

## 2023-06-17 HISTORY — PX: AMPUTATION: SHX166

## 2023-06-17 HISTORY — DX: Personal history of urinary calculi: Z87.442

## 2023-06-17 LAB — HEMOGLOBIN A1C
Hgb A1c MFr Bld: 7.7 % — ABNORMAL HIGH (ref 4.8–5.6)
Mean Plasma Glucose: 174.29 mg/dL

## 2023-06-17 LAB — CBC WITH DIFFERENTIAL/PLATELET
Abs Immature Granulocytes: 0.01 10*3/uL (ref 0.00–0.07)
Basophils Absolute: 0.1 10*3/uL (ref 0.0–0.1)
Basophils Relative: 2 %
Eosinophils Absolute: 0.1 10*3/uL (ref 0.0–0.5)
Eosinophils Relative: 2 %
HCT: 39.7 % (ref 39.0–52.0)
Hemoglobin: 13.9 g/dL (ref 13.0–17.0)
Immature Granulocytes: 0 %
Lymphocytes Relative: 21 %
Lymphs Abs: 1.1 10*3/uL (ref 0.7–4.0)
MCH: 29.5 pg (ref 26.0–34.0)
MCHC: 35 g/dL (ref 30.0–36.0)
MCV: 84.3 fL (ref 80.0–100.0)
Monocytes Absolute: 0.4 10*3/uL (ref 0.1–1.0)
Monocytes Relative: 7 %
Neutro Abs: 3.6 10*3/uL (ref 1.7–7.7)
Neutrophils Relative %: 68 %
Platelets: 148 10*3/uL — ABNORMAL LOW (ref 150–400)
RBC: 4.71 MIL/uL (ref 4.22–5.81)
RDW: 14 % (ref 11.5–15.5)
WBC: 5.3 10*3/uL (ref 4.0–10.5)
nRBC: 0 % (ref 0.0–0.2)

## 2023-06-17 LAB — GLUCOSE, CAPILLARY
Glucose-Capillary: 305 mg/dL — ABNORMAL HIGH (ref 70–99)
Glucose-Capillary: 217 mg/dL — ABNORMAL HIGH (ref 70–99)
Glucose-Capillary: 170 mg/dL — ABNORMAL HIGH (ref 70–99)
Glucose-Capillary: 244 mg/dL — ABNORMAL HIGH (ref 70–99)
Glucose-Capillary: 352 mg/dL — ABNORMAL HIGH (ref 70–99)

## 2023-06-17 LAB — COMPREHENSIVE METABOLIC PANEL
ALT: 19 U/L (ref 0–44)
AST: 19 U/L (ref 15–41)
Albumin: 4 g/dL (ref 3.5–5.0)
Alkaline Phosphatase: 107 U/L (ref 38–126)
Anion gap: 14 (ref 5–15)
BUN: 11 mg/dL (ref 8–23)
CO2: 20 mmol/L — ABNORMAL LOW (ref 22–32)
Calcium: 8.5 mg/dL — ABNORMAL LOW (ref 8.9–10.3)
Chloride: 99 mmol/L (ref 98–111)
Creatinine, Ser: 1.21 mg/dL (ref 0.61–1.24)
GFR, Estimated: >60 mL/min (ref >60)
Glucose, Bld: 278 mg/dL — ABNORMAL HIGH (ref 70–99)
Potassium: 3.9 mmol/L (ref 3.5–5.1)
Sodium: 133 mmol/L — ABNORMAL LOW (ref 135–145)
Total Bilirubin: 2 mg/dL — ABNORMAL HIGH (ref <1.2)
Total Protein: 6.3 g/dL — ABNORMAL LOW (ref 6.5–8.1)

## 2023-06-17 LAB — PREALBUMIN: Prealbumin: 23 mg/dL (ref 18–38)

## 2023-06-17 SURGERY — AMPUTATION BELOW KNEE
Anesthesia: General | Site: Knee | Laterality: Left

## 2023-06-17 MED ORDER — OXYCODONE HCL 5 MG/5ML PO SOLN: 5.0000 mg | Freq: Once | ORAL | Status: DC | PRN

## 2023-06-17 MED ORDER — ROPIVACAINE HCL 5 MG/ML IJ SOLN
INTRAMUSCULAR | Status: DC | PRN
Start: 2023-06-17 — End: 2023-06-17
  Administered 2023-06-17: 50 mL via PERINEURAL

## 2023-06-17 MED ORDER — AMISULPRIDE (ANTIEMETIC) 5 MG/2ML IV SOLN: 10.0000 mg | Freq: Once | INTRAVENOUS | Status: DC | PRN

## 2023-06-17 MED ORDER — HYDROMORPHONE HCL 1 MG/ML IJ SOLN
0.5000 mg | INTRAMUSCULAR | Status: DC | PRN
  Administered 2023-06-18 – 2023-06-19 (×3): 1 mg via INTRAVENOUS
  Filled 2023-06-17 (×3): qty 1

## 2023-06-17 MED ORDER — FAMOTIDINE 20 MG PO TABS
20.0000 mg | ORAL_TABLET | Freq: Every day | ORAL | Status: DC
  Administered 2023-06-17 – 2023-06-21 (×5): 20 mg via ORAL
  Filled 2023-06-17 (×5): qty 1

## 2023-06-17 MED ORDER — MIDAZOLAM HCL 2 MG/2ML IJ SOLN
INTRAMUSCULAR | Status: AC
  Administered 2023-06-17: 2 mg via INTRAVENOUS
  Filled 2023-06-17: qty 2

## 2023-06-17 MED ORDER — SODIUM CHLORIDE 0.9 % IV SOLN: 12.5000 mg | INTRAVENOUS | Status: DC | PRN

## 2023-06-17 MED ORDER — MIDAZOLAM HCL 2 MG/2ML IJ SOLN: 2.0000 mg | Freq: Once | INTRAMUSCULAR | Status: AC

## 2023-06-17 MED ORDER — ONDANSETRON HCL 4 MG/2ML IJ SOLN
INTRAMUSCULAR | Status: DC | PRN
  Administered 2023-06-17: 4 mg via INTRAVENOUS

## 2023-06-17 MED ORDER — POTASSIUM CHLORIDE CRYS ER 20 MEQ PO TBCR
20.0000 meq | EXTENDED_RELEASE_TABLET | Freq: Every day | ORAL | Status: DC
Start: 2023-06-17 — End: 2023-06-21
  Administered 2023-06-17 – 2023-06-21 (×5): 20 meq via ORAL
  Filled 2023-06-17 (×5): qty 1

## 2023-06-17 MED ORDER — VITAMIN C 500 MG PO TABS
1000.0000 mg | ORAL_TABLET | Freq: Every day | ORAL | Status: DC
  Administered 2023-06-17 – 2023-06-21 (×5): 1000 mg via ORAL
  Filled 2023-06-17 (×5): qty 2

## 2023-06-17 MED ORDER — PANTOPRAZOLE SODIUM 40 MG PO TBEC
40.0000 mg | DELAYED_RELEASE_TABLET | Freq: Every day | ORAL | Status: DC
  Administered 2023-06-18 – 2023-06-21 (×4): 40 mg via ORAL
  Filled 2023-06-17 (×3): qty 1
  Filled 2023-06-17: qty 2

## 2023-06-17 MED ORDER — ONDANSETRON HCL 4 MG/2ML IJ SOLN: 4.0000 mg | Freq: Four times a day (QID) | INTRAMUSCULAR | Status: DC | PRN

## 2023-06-17 MED ORDER — DOCUSATE SODIUM 100 MG PO CAPS
100.0000 mg | ORAL_CAPSULE | Freq: Every day | ORAL | Status: DC
  Administered 2023-06-18 – 2023-06-21 (×3): 100 mg via ORAL
  Filled 2023-06-17 (×4): qty 1

## 2023-06-17 MED ORDER — INSULIN ASPART 100 UNIT/ML IJ SOLN
INTRAMUSCULAR | Status: AC
  Administered 2023-06-17: 10 [IU] via SUBCUTANEOUS
  Filled 2023-06-17: qty 1

## 2023-06-17 MED ORDER — 0.9 % SODIUM CHLORIDE (POUR BTL) OPTIME
TOPICAL | Status: DC | PRN
  Administered 2023-06-17: 1000 mL

## 2023-06-17 MED ORDER — MAGNESIUM CITRATE PO SOLN: 1.0000 | Freq: Once | ORAL | Status: DC | PRN

## 2023-06-17 MED ORDER — TRANEXAMIC ACID-NACL 1000-0.7 MG/100ML-% IV SOLN
1000.0000 mg | INTRAVENOUS | Status: AC
  Administered 2023-06-17: 1000 mg via INTRAVENOUS
  Filled 2023-06-17: qty 100

## 2023-06-17 MED ORDER — SUCRALFATE 1 G PO TABS
1.0000 g | ORAL_TABLET | Freq: Three times a day (TID) | ORAL | Status: DC
  Administered 2023-06-17 – 2023-06-21 (×12): 1 g via ORAL
  Filled 2023-06-17 (×12): qty 1

## 2023-06-17 MED ORDER — ORAL CARE MOUTH RINSE: 15.0000 mL | Freq: Once | OROMUCOSAL | Status: AC

## 2023-06-17 MED ORDER — HYDROMORPHONE HCL 1 MG/ML IJ SOLN: 0.2500 mg | INTRAMUSCULAR | Status: DC | PRN

## 2023-06-17 MED ORDER — SODIUM CHLORIDE 0.9 % IV SOLN: INTRAVENOUS | Status: DC

## 2023-06-17 MED ORDER — FENTANYL CITRATE (PF) 250 MCG/5ML IJ SOLN
INTRAMUSCULAR | Status: AC
  Filled 2023-06-17: qty 5

## 2023-06-17 MED ORDER — ALUM & MAG HYDROXIDE-SIMETH 200-200-20 MG/5ML PO SUSP: 15.0000 mL | ORAL | Status: DC | PRN

## 2023-06-17 MED ORDER — ZINC SULFATE 220 (50 ZN) MG PO CAPS
220.0000 mg | ORAL_CAPSULE | Freq: Every day | ORAL | Status: DC
  Administered 2023-06-17 – 2023-06-21 (×5): 220 mg via ORAL
  Filled 2023-06-17 (×5): qty 1

## 2023-06-17 MED ORDER — ASPIRIN 81 MG PO TBEC
81.0000 mg | DELAYED_RELEASE_TABLET | Freq: Every day | ORAL | Status: DC
  Administered 2023-06-17 – 2023-06-21 (×5): 81 mg via ORAL
  Filled 2023-06-17 (×5): qty 1

## 2023-06-17 MED ORDER — HYDRALAZINE HCL 20 MG/ML IJ SOLN: 5.0000 mg | INTRAMUSCULAR | Status: DC | PRN

## 2023-06-17 MED ORDER — LIDOCAINE 2% (20 MG/ML) 5 ML SYRINGE
INTRAMUSCULAR | Status: AC
  Filled 2023-06-17: qty 5

## 2023-06-17 MED ORDER — POTASSIUM CHLORIDE CRYS ER 20 MEQ PO TBCR: 20.0000 meq | EXTENDED_RELEASE_TABLET | Freq: Every day | ORAL | Status: DC | PRN

## 2023-06-17 MED ORDER — CEFAZOLIN SODIUM-DEXTROSE 2-4 GM/100ML-% IV SOLN
2.0000 g | INTRAVENOUS | Status: AC
  Administered 2023-06-17: 2 g via INTRAVENOUS
  Filled 2023-06-17: qty 100

## 2023-06-17 MED ORDER — TAMSULOSIN HCL 0.4 MG PO CAPS
0.4000 mg | ORAL_CAPSULE | Freq: Every day | ORAL | Status: DC
  Administered 2023-06-18 – 2023-06-21 (×4): 0.4 mg via ORAL
  Filled 2023-06-17 (×4): qty 1

## 2023-06-17 MED ORDER — ISOSORBIDE MONONITRATE ER 30 MG PO TB24
30.0000 mg | ORAL_TABLET | Freq: Every day | ORAL | Status: DC
  Administered 2023-06-18 – 2023-06-21 (×4): 30 mg via ORAL
  Filled 2023-06-17 (×4): qty 1

## 2023-06-17 MED ORDER — INSULIN ASPART 100 UNIT/ML IJ SOLN
4.0000 [IU] | Freq: Three times a day (TID) | INTRAMUSCULAR | Status: DC
  Administered 2023-06-17 – 2023-06-21 (×12): 4 [IU] via SUBCUTANEOUS

## 2023-06-17 MED ORDER — PHENOL 1.4 % MT LIQD: 1.0000 | OROMUCOSAL | Status: DC | PRN

## 2023-06-17 MED ORDER — JUVEN PO PACK
1.0000 | PACK | Freq: Two times a day (BID) | ORAL | Status: DC
  Administered 2023-06-17 – 2023-06-19 (×4): 1 via ORAL
  Filled 2023-06-17 (×6): qty 1

## 2023-06-17 MED ORDER — ACETAMINOPHEN 325 MG PO TABS
325.0000 mg | ORAL_TABLET | Freq: Four times a day (QID) | ORAL | Status: DC | PRN
  Administered 2023-06-18: 325 mg via ORAL
  Administered 2023-06-19: 650 mg via ORAL
  Filled 2023-06-17 (×2): qty 2

## 2023-06-17 MED ORDER — INSULIN ASPART 100 UNIT/ML IJ SOLN
0.0000 [IU] | Freq: Three times a day (TID) | INTRAMUSCULAR | Status: DC
  Administered 2023-06-17: 5 [IU] via SUBCUTANEOUS
  Administered 2023-06-18: 8 [IU] via SUBCUTANEOUS
  Administered 2023-06-18: 11 [IU] via SUBCUTANEOUS
  Administered 2023-06-18: 15 [IU] via SUBCUTANEOUS
  Administered 2023-06-19: 11 [IU] via SUBCUTANEOUS
  Administered 2023-06-19 – 2023-06-20 (×3): 8 [IU] via SUBCUTANEOUS
  Administered 2023-06-20: 5 [IU] via SUBCUTANEOUS
  Administered 2023-06-20 – 2023-06-21 (×3): 8 [IU] via SUBCUTANEOUS

## 2023-06-17 MED ORDER — ATORVASTATIN CALCIUM 10 MG PO TABS
20.0000 mg | ORAL_TABLET | Freq: Every morning | ORAL | Status: DC
  Administered 2023-06-18 – 2023-06-21 (×4): 20 mg via ORAL
  Filled 2023-06-17 (×4): qty 2

## 2023-06-17 MED ORDER — POLYETHYLENE GLYCOL 3350 17 G PO PACK
17.0000 g | PACK | Freq: Every day | ORAL | Status: DC | PRN
  Filled 2023-06-17: qty 1

## 2023-06-17 MED ORDER — GABAPENTIN 300 MG PO CAPS
600.0000 mg | ORAL_CAPSULE | Freq: Three times a day (TID) | ORAL | Status: DC
  Administered 2023-06-17 – 2023-06-21 (×12): 600 mg via ORAL
  Filled 2023-06-17 (×12): qty 2

## 2023-06-17 MED ORDER — LIDOCAINE 2% (20 MG/ML) 5 ML SYRINGE
INTRAMUSCULAR | Status: DC | PRN
  Administered 2023-06-17: 40 mg via INTRAVENOUS

## 2023-06-17 MED ORDER — BISACODYL 5 MG PO TBEC
5.0000 mg | DELAYED_RELEASE_TABLET | Freq: Every day | ORAL | Status: DC | PRN
  Administered 2023-06-19: 5 mg via ORAL
  Filled 2023-06-17: qty 1

## 2023-06-17 MED ORDER — OXYCODONE HCL 5 MG PO TABS
5.0000 mg | ORAL_TABLET | ORAL | Status: DC | PRN
  Administered 2023-06-18: 10 mg via ORAL
  Administered 2023-06-18: 5 mg via ORAL
  Filled 2023-06-17 (×3): qty 2

## 2023-06-17 MED ORDER — INSULIN ASPART 100 UNIT/ML IJ SOLN
0.0000 [IU] | INTRAMUSCULAR | Status: AC | PRN
  Administered 2023-06-17: 4 [IU] via SUBCUTANEOUS
  Filled 2023-06-17: qty 1

## 2023-06-17 MED ORDER — PANTOPRAZOLE SODIUM 40 MG PO TBEC: 40.0000 mg | DELAYED_RELEASE_TABLET | Freq: Every day | ORAL | Status: DC

## 2023-06-17 MED ORDER — FUROSEMIDE 20 MG PO TABS
20.0000 mg | ORAL_TABLET | Freq: Every day | ORAL | Status: DC
  Administered 2023-06-17 – 2023-06-21 (×5): 20 mg via ORAL
  Filled 2023-06-17 (×5): qty 1

## 2023-06-17 MED ORDER — ONDANSETRON HCL 4 MG/2ML IJ SOLN
INTRAMUSCULAR | Status: AC
  Filled 2023-06-17: qty 2

## 2023-06-17 MED ORDER — CEFAZOLIN SODIUM-DEXTROSE 2-4 GM/100ML-% IV SOLN
2.0000 g | Freq: Three times a day (TID) | INTRAVENOUS | Status: AC
  Administered 2023-06-17 – 2023-06-18 (×2): 2 g via INTRAVENOUS
  Filled 2023-06-17 (×2): qty 100

## 2023-06-17 MED ORDER — MAGNESIUM SULFATE 2 GM/50ML IV SOLN: 2.0000 g | Freq: Every day | INTRAVENOUS | Status: DC | PRN

## 2023-06-17 MED ORDER — LACTATED RINGERS IV SOLN: INTRAVENOUS | Status: DC

## 2023-06-17 MED ORDER — CHLORHEXIDINE GLUCONATE 0.12 % MT SOLN
15.0000 mL | Freq: Once | OROMUCOSAL | Status: AC
  Administered 2023-06-17: 15 mL via OROMUCOSAL
  Filled 2023-06-17: qty 15

## 2023-06-17 MED ORDER — FENTANYL CITRATE (PF) 250 MCG/5ML IJ SOLN
INTRAMUSCULAR | Status: DC | PRN
  Administered 2023-06-17: 25 ug via INTRAVENOUS

## 2023-06-17 MED ORDER — ZOLPIDEM TARTRATE 5 MG PO TABS
10.0000 mg | ORAL_TABLET | Freq: Every day | ORAL | Status: DC
  Administered 2023-06-17 – 2023-06-20 (×4): 10 mg via ORAL
  Filled 2023-06-17 (×4): qty 2

## 2023-06-17 MED ORDER — TRANEXAMIC ACID 1000 MG/10ML IV SOLN
2000.0000 mg | INTRAVENOUS | Status: DC
  Filled 2023-06-17: qty 20

## 2023-06-17 MED ORDER — MEPERIDINE HCL 25 MG/ML IJ SOLN: 6.2500 mg | INTRAMUSCULAR | Status: DC | PRN

## 2023-06-17 MED ORDER — DEXAMETHASONE SODIUM PHOSPHATE 10 MG/ML IJ SOLN
INTRAMUSCULAR | Status: DC | PRN
  Administered 2023-06-17: 10 mg via INTRAVENOUS

## 2023-06-17 MED ORDER — OXYCODONE HCL 5 MG PO TABS: 5.0000 mg | ORAL_TABLET | Freq: Once | ORAL | Status: DC | PRN

## 2023-06-17 MED ORDER — OXYCODONE HCL 5 MG PO TABS
10.0000 mg | ORAL_TABLET | ORAL | Status: DC | PRN
  Administered 2023-06-18 (×3): 15 mg via ORAL
  Administered 2023-06-19: 10 mg via ORAL
  Administered 2023-06-19 – 2023-06-21 (×7): 15 mg via ORAL
  Filled 2023-06-17 (×12): qty 3

## 2023-06-17 MED ORDER — PROPOFOL 10 MG/ML IV BOLUS
INTRAVENOUS | Status: AC
  Filled 2023-06-17: qty 20

## 2023-06-17 MED ORDER — LABETALOL HCL 5 MG/ML IV SOLN: 10.0000 mg | INTRAVENOUS | Status: DC | PRN

## 2023-06-17 MED ORDER — EPHEDRINE SULFATE-NACL 50-0.9 MG/10ML-% IV SOSY
PREFILLED_SYRINGE | INTRAVENOUS | Status: DC | PRN
  Administered 2023-06-17 (×2): 10 mg via INTRAVENOUS

## 2023-06-17 MED ORDER — GUAIFENESIN-DM 100-10 MG/5ML PO SYRP: 15.0000 mL | ORAL_SOLUTION | ORAL | Status: DC | PRN

## 2023-06-17 MED ORDER — FENTANYL CITRATE (PF) 100 MCG/2ML IJ SOLN
INTRAMUSCULAR | Status: AC
  Administered 2023-06-17: 100 ug via INTRAVENOUS
  Filled 2023-06-17: qty 2

## 2023-06-17 MED ORDER — PROPOFOL 10 MG/ML IV BOLUS
INTRAVENOUS | Status: DC | PRN
  Administered 2023-06-17: 200 mg via INTRAVENOUS

## 2023-06-17 MED ORDER — CARVEDILOL 12.5 MG PO TABS
12.5000 mg | ORAL_TABLET | Freq: Two times a day (BID) | ORAL | Status: DC
  Administered 2023-06-17 – 2023-06-21 (×8): 12.5 mg via ORAL
  Filled 2023-06-17 (×8): qty 1

## 2023-06-17 MED ORDER — DEXAMETHASONE SODIUM PHOSPHATE 10 MG/ML IJ SOLN
INTRAMUSCULAR | Status: AC
Start: 2023-06-17 — End: ?
  Filled 2023-06-17: qty 1

## 2023-06-17 MED ORDER — FENTANYL CITRATE (PF) 100 MCG/2ML IJ SOLN: 100.0000 ug | Freq: Once | INTRAMUSCULAR | Status: AC

## 2023-06-17 MED ORDER — METOPROLOL TARTRATE 5 MG/5ML IV SOLN: 2.0000 mg | INTRAVENOUS | Status: DC | PRN

## 2023-06-17 MED ORDER — INSULIN GLARGINE-YFGN 100 UNIT/ML ~~LOC~~ SOLN
20.0000 [IU] | Freq: Every day | SUBCUTANEOUS | Status: DC
  Administered 2023-06-17: 20 [IU] via SUBCUTANEOUS
  Filled 2023-06-17 (×2): qty 0.2

## 2023-06-17 SURGICAL SUPPLY — 41 items
BAG COUNTER SPONGE SURGICOUNT (BAG) IMPLANT
BAG SPNG CNTER NS LX DISP (BAG)
BLADE SAW RECIP 87.9 MT (BLADE) ×1 IMPLANT
BLADE SURG 21 STRL SS (BLADE) ×1 IMPLANT
BNDG CMPR 5X6 CHSV STRCH STRL (GAUZE/BANDAGES/DRESSINGS)
BNDG COHESIVE 6X5 TAN ST LF (GAUZE/BANDAGES/DRESSINGS) IMPLANT
CANISTER WOUND CARE 500ML ATS (WOUND CARE) ×1 IMPLANT
CANISTER WOUNDNEG PRESSURE 500 (CANNISTER) IMPLANT
COVER SURGICAL LIGHT HANDLE (MISCELLANEOUS) ×1 IMPLANT
CUFF TOURN SGL QUICK 34 (TOURNIQUET CUFF) ×1
CUFF TRNQT CYL 34X4.125X (TOURNIQUET CUFF) ×1 IMPLANT
DRAPE DERMATAC (DRAPES) IMPLANT
DRAPE INCISE IOBAN 66X45 STRL (DRAPES) ×1 IMPLANT
DRAPE U-SHAPE 47X51 STRL (DRAPES) ×1 IMPLANT
DRESSING PREVENA PLUS CUSTOM (GAUZE/BANDAGES/DRESSINGS) ×1 IMPLANT
DRSG PREVENA PLUS CUSTOM (GAUZE/BANDAGES/DRESSINGS) ×1 IMPLANT
DURAPREP 26ML APPLICATOR (WOUND CARE) ×1 IMPLANT
ELECT REM PT RETURN 9FT ADLT (ELECTROSURGICAL) ×1 IMPLANT
ELECTRODE REM PT RTRN 9FT ADLT (ELECTROSURGICAL) ×1 IMPLANT
GLOVE BIOGEL PI IND STRL 9 (GLOVE) ×1 IMPLANT
GLOVE SURG ORTHO 9.0 STRL STRW (GLOVE) ×1 IMPLANT
GOWN STRL REUS W/ TWL XL LVL3 (GOWN DISPOSABLE) ×2 IMPLANT
GOWN STRL REUS W/TWL XL LVL3 (GOWN DISPOSABLE) ×2
GRAFT SKIN WND MICRO 38 (Tissue) IMPLANT
KIT BASIN OR (CUSTOM PROCEDURE TRAY) ×1 IMPLANT
KIT TURNOVER KIT B (KITS) ×1 IMPLANT
MANIFOLD NEPTUNE II (INSTRUMENTS) ×1 IMPLANT
NS IRRIG 1000ML POUR BTL (IV SOLUTION) ×1 IMPLANT
PACK ORTHO EXTREMITY (CUSTOM PROCEDURE TRAY) ×1 IMPLANT
PAD ARMBOARD 7.5X6 YLW CONV (MISCELLANEOUS) ×1 IMPLANT
PREVENA RESTOR ARTHOFORM 46X30 (CANNISTER) ×1 IMPLANT
SPONGE T-LAP 18X18 ~~LOC~~+RFID (SPONGE) IMPLANT
STAPLER VISISTAT 35W (STAPLE) IMPLANT
STOCKINETTE IMPERVIOUS LG (DRAPES) ×1 IMPLANT
SUT ETHILON 2 0 PSLX (SUTURE) IMPLANT
SUT SILK 2 0 (SUTURE)
SUT SILK 2-0 18XBRD TIE 12 (SUTURE) ×1 IMPLANT
SUT VIC AB 1 CTX 27 (SUTURE) ×2 IMPLANT
TOWEL GREEN STERILE (TOWEL DISPOSABLE) ×1 IMPLANT
TUBE CONNECTING 12X1/4 (SUCTIONS) ×1 IMPLANT
YANKAUER SUCT BULB TIP NO VENT (SUCTIONS) ×1 IMPLANT

## 2023-06-17 NOTE — Op Note (Signed)
06/17/2023  12:34 PM  PATIENT:  Mike Floyd    PRE-OPERATIVE DIAGNOSIS:  Osteomyelitis Left Foot  POST-OPERATIVE DIAGNOSIS:  Same  PROCEDURE:  LEFT BELOW KNEE AMPUTATION Application of Kerecis micro graft 38 cm  Application of Prevena customizable and Prevena arthroform wound VAC dressings Application of Vive Wear stump shrinker and the [employer] limb protector  SURGEON:  Nadara Mustard, MD  ANESTHESIA:   General  PREOPERATIVE INDICATIONS:  Mike Floyd is a  62 y.o. male with a diagnosis of Osteomyelitis Left Foot who failed conservative measures and elected for surgical management.    The risks benefits and alternatives were discussed with the patient preoperatively including but not limited to the risks of infection, bleeding, nerve injury, cardiopulmonary complications, the need for revision surgery, among others, and the patient was willing to proceed.  OPERATIVE IMPLANTS:   Implant Name Type Inv. Item Serial No. Manufacturer Lot No. LRB No. Used Action  GRAFT SKIN WND MICRO 38 - [id number] Tissue GRAFT SKIN WND MICRO 38  [employer] [id number] Left 1 Implanted     OPERATIVE FINDINGS: tissue margins clear  OPERATIVE PROCEDURE: Patient was brought to the operating room after undergoing a regional anesthetic.  After adequate levels anesthesia were obtained a thigh tourniquet was placed and the lower extremity was prepped using DuraPrep draped into a sterile field. The foot was draped out of the sterile field with impervious stockinette.  A timeout was called and the tourniquet inflated.  A transverse skin incision was made 12 cm distal to the tibial tubercle, the incision curved proximally, and a large posterior flap was created.  The tibia was transected just proximal to the skin incision and beveled anteriorly.  The fibula was transected just proximal to the tibial incision.  The sciatic nerve was pulled cut and allowed to retract.  The vascular bundles were suture ligated with  2-0 silk.  The tourniquet was deflated and hemostasis obtained.     The Kerecis micro powder 38 cm was applied to the open wound that has a 200 cm surface area.  The deep and superficial fascial layers were closed using #1 Vicryl.  The skin was closed using staples.    The Prevena customizable dressing was applied this was overwrapped with the arthroform sponge.  Collier Flowers was used to secure the sponges and the circumferential compression was secured to the skin with Dermatac.  This was connected to the wound VAC pump and had a good suction fit this was covered with a stump shrinker and a limb protector.  Patient was taken to the PACU in stable condition.   DISCHARGE PLANNING:  Antibiotic duration: 24-hour antibiotics  Weightbearing: Nonweightbearing on the operative extremity  Pain medication: Opioid pathway  Dressing care/ Wound VAC: Continue wound VAC with the Prevena plus pump at discharge for 1 week  Ambulatory devices: Walker or kneeling scooter  Discharge to: Discharge planning based on recommendations per physical therapy  Follow-up: In the office 1 week after discharge.

## 2023-06-17 NOTE — Plan of Care (Signed)
  Problem: Education: Goal: Knowledge of General Education information will improve Description: Including pain rating scale, medication(s)/side effects and non-pharmacologic comfort measures Outcome: Progressing   Problem: Health Behavior/Discharge Planning: Goal: Ability to manage health-related needs will improve Outcome: Progressing   Problem: Clinical Measurements: Goal: Ability to maintain clinical measurements within normal limits will improve Outcome: Progressing Goal: Will remain free from infection Outcome: Progressing Goal: Diagnostic test results will improve Outcome: Progressing Goal: Respiratory complications will improve Outcome: Progressing Goal: Cardiovascular complication will be avoided Outcome: Progressing   Problem: Activity: Goal: Risk for activity intolerance will decrease Outcome: Progressing   Problem: Nutrition: Goal: Adequate nutrition will be maintained Outcome: Progressing   Problem: Coping: Goal: Level of anxiety will decrease Outcome: Progressing   Problem: Elimination: Goal: Will not experience complications related to bowel motility Outcome: Progressing Goal: Will not experience complications related to urinary retention Outcome: Progressing   Problem: Pain Management: Goal: General experience of comfort will improve Outcome: Progressing   Problem: Safety: Goal: Ability to remain free from injury will improve Outcome: Progressing   Problem: Skin Integrity: Goal: Risk for impaired skin integrity will decrease Outcome: Progressing   Problem: Education: Goal: Ability to describe self-care measures that may prevent or decrease complications (Diabetes Survival Skills Education) will improve Outcome: Progressing Goal: Individualized Educational Video(s) Outcome: Progressing   Problem: Coping: Goal: Ability to adjust to condition or change in health will improve Outcome: Progressing   Problem: Fluid Volume: Goal: Ability to  maintain a balanced intake and output will improve Outcome: Progressing   Problem: Health Behavior/Discharge Planning: Goal: Ability to identify and utilize available resources and services will improve Outcome: Progressing Goal: Ability to manage health-related needs will improve Outcome: Progressing   Problem: Metabolic: Goal: Ability to maintain appropriate glucose levels will improve Outcome: Progressing   Problem: Nutritional: Goal: Maintenance of adequate nutrition will improve Outcome: Progressing Goal: Progress toward achieving an optimal weight will improve Outcome: Progressing   Problem: Skin Integrity: Goal: Risk for impaired skin integrity will decrease Outcome: Progressing   Problem: Tissue Perfusion: Goal: Adequacy of tissue perfusion will improve Outcome: Progressing   Problem: Education: Goal: Knowledge of the prescribed therapeutic regimen will improve Outcome: Progressing Goal: Ability to verbalize activity precautions or restrictions will improve Outcome: Progressing Goal: Understanding of discharge needs will improve Outcome: Progressing   Problem: Activity: Goal: Ability to perform//tolerate increased activity and mobilize with assistive devices will improve Outcome: Progressing   Problem: Clinical Measurements: Goal: Postoperative complications will be avoided or minimized Outcome: Progressing   Problem: Self-Care: Goal: Ability to meet self-care needs will improve Outcome: Progressing   Problem: Self-Concept: Goal: Ability to maintain and perform role responsibilities to the fullest extent possible will improve Outcome: Progressing   Problem: Pain Management: Goal: Pain level will decrease with appropriate interventions Outcome: Progressing   Problem: Skin Integrity: Goal: Demonstration of wound healing without infection will improve Outcome: Progressing

## 2023-06-17 NOTE — Anesthesia Procedure Notes (Signed)
Procedure Name: LMA Insertion Date/Time: 06/17/2023 11:51 AM  Performed by: Allyn Kenner, CRNAPre-anesthesia Checklist: Patient identified, Emergency Drugs available, Suction available and Patient being monitored Patient Re-evaluated:Patient Re-evaluated prior to induction Oxygen Delivery Method: Circle System Utilized Preoxygenation: Pre-oxygenation with 100% oxygen Induction Type: IV induction Ventilation: Mask ventilation without difficulty LMA: LMA inserted LMA Size: 4.0 Number of attempts: 1 Airway Equipment and Method: Bite block Placement Confirmation: positive ETCO2 Tube secured with: Tape Dental Injury: Teeth and Oropharynx as per pre-operative assessment

## 2023-06-17 NOTE — Interval H&P Note (Signed)
History and Physical Interval Note:  06/17/2023 9:32 AM  Mike Floyd  has presented today for surgery, with the diagnosis of Osteomyelitis Left Foot.  The various methods of treatment have been discussed with the patient and family. After consideration of risks, benefits and other options for treatment, the patient has consented to  Procedure(s): LEFT BELOW KNEE AMPUTATION (Left) as a surgical intervention.  The patient's history has been reviewed, patient examined, no change in status, stable for surgery.  I have reviewed the patient's chart and labs.  Questions were answered to the patient's satisfaction.     Nadara Mustard

## 2023-06-17 NOTE — Transfer of Care (Signed)
Immediate Anesthesia Transfer of Care Note  Patient: Mike Floyd  Procedure(s) Performed: LEFT BELOW KNEE AMPUTATION (Left: Knee)  Patient Location: PACU  Anesthesia Type:GA combined with regional for post-op pain  Level of Consciousness: awake, alert , and oriented  Airway & Oxygen Therapy: Patient Spontanous Breathing and Patient connected to face mask oxygen  Post-op Assessment: Report given to RN and Post -op Vital signs reviewed and stable  Post vital signs: Reviewed and stable  Last Vitals:  Vitals Value Taken Time  BP 92/65 06/17/23 1232  Temp    Pulse 59 06/17/23 1236  Resp 12 06/17/23 1236  SpO2 100 % 06/17/23 1236  Vitals shown include unfiled device data.  Last Pain:  Vitals:   06/17/23 1022  TempSrc:   PainSc: 0-No pain         Complications: No notable events documented.

## 2023-06-17 NOTE — H&P (Signed)
Mike Floyd is an 62 y.o. male.   Chief Complaint: Dehiscence transmetatarsal amputation left foot. HPI:  Patient is a 62 year old gentleman who presents in follow-up for a left transmetatarsal amputation that is 1 year from surgery.  Patient reports cellulitis ulceration and drainage.   Past Medical History:  Diagnosis Date   AICD (automatic cardioverter/defibrillator) present 11/2007   Removed in 2018, a. 11/2007 S[facility] Current VR - single lead ICD  - Removed 2018 - "it was burning me"   Anxiety    CAD (coronary artery disease)    non-obstructive CAD by Cor CT in 2020 // Myoview 3/22: EF 57, small inf-sept defect c/w scar, no ischemia; low risk      Chest pain    a. 10/2007 Cath:  normal Cors.   CKD (chronic kidney disease), stage II    DDD (degenerative disc disease), lumbar    Diabetes mellitus DX: 2010   type 2   Erosive esophagitis    a. per EGD (08/2011), Dr. Karilyn Cota - Erosive reflux esophagitis improved but not completely healed since previous EGD 3 years ago. Bx showing  ulcerated gatroesophageal junction mucosa. negative for H. pylori   GERD (gastroesophageal reflux disease)    Gout    Hearing deficit    a. wear bilateral hearing aides   History of hiatal hernia    History of kidney stones    passed stones, no surgery   Hypertension    Mildly dilatd aortic root (HCC)    CMR 4/22: EF 52, no LGE; d/w Dr. Lorretta Harp root 38 mm (mildly dilated)   Myocardial infarction [facility]) 2011   Neuropathy    Feet and legs   Nonischemic dilated cardiomyopathy (HCC)    a. H/O EF as low as 35-40% by LV gram 10/2007;  b. Echo 02/2011 EF 50-55%, inf HK, Gr 1 DD // CMR 4/22: EF 52, no LGE; d/w Dr. Lorretta Harp root 38 mm (mildly dilated)    Renal insufficiency    Sleep apnea    pt doesnt use, states"I cant afford one". PCP aware   Stroke (HCC)    mini-stroke in 2014   TIA (transient ischemic attack)    July, 2013   Tobacco abuse, in remission 06/27/2009   Discontinued in 2009     Wears dentures     top plate   WPW (Wolff-Parkinson-White syndrome)    a. s/p RFCA @ [facility] - 1999    Past Surgical History:  Procedure Laterality Date   AMPUTATION Left 05/19/2022   Procedure: TRANSMETATARSAL AMPUTATION LEFT FOOT;  Surgeon: Nadara Mustard, MD;  Location: [facility] OR;  Service: Orthopedics;  Laterality: Left;   APPENDECTOMY     BACK SURGERY     1995   CARDIAC CATHETERIZATION     2009   CARDIAC DEFIBRILLATOR PLACEMENT     ICD was removed in 2018   CHOLECYSTECTOMY     CHOLECYSTECTOMY, LAPAROSCOPIC     11/2007   COLONOSCOPY W/ POLYPECTOMY  2009   ELBOW SURGERY Left 06/2010   ESOPHAGEAL DILATION N/A 11/08/2014   Procedure: ESOPHAGEAL DILATION;  Surgeon: Malissa Hippo, MD;  Location: [facility] ORS;  Service: Endoscopy;  Laterality: N/A;  #56,    ESOPHAGOGASTRODUODENOSCOPY  03/31/2012   also 08/2011; [name]   ESOPHAGOGASTRODUODENOSCOPY (EGD) WITH PROPOFOL N/A 11/08/2014   Procedure: ESOPHAGOGASTRODUODENOSCOPY (EGD) WITH PROPOFOL;  Surgeon: Malissa Hippo, MD;  Location: [facility];  Service: Endoscopy;  Laterality: N/A;  Hiatus is 39 , GE Junction is 37   FOOT ARTHRODESIS  Left 10/24/2020   Procedure: LEFT GASTROCNEMIUS RECESSION, DORSIFLEXION OSTEOTOMY 1ST MT;  Surgeon: Nadara Mustard, MD;  [facility];  Service: Orthopedics;  Laterality: Left;   FOOT ARTHRODESIS Right 01/09/2021   Procedure: CLOSING WEDGE OSTEOTOMY RIGHT 1ST METATARSAL;  Surgeon: Nadara Mustard, MD;  Location: [facility] OR;  Service: Orthopedics;  Laterality: Right;   GASTROCNEMIUS RECESSION Right 01/09/2021   Procedure: RIGHT GASTROCNEMIUS RECESSION;  Surgeon: Nadara Mustard, MD;  Location: [facility] OR;  Service: Orthopedics;  Laterality: Right;   ICD LEAD REMOVAL N/A 10/07/2016   Procedure: ICD LEAD REMOVAL ;  Surgeon: Marinus Maw, MD;  Location: [facility] OR;  Service: Cardiovascular;  Laterality: N/A;   MULTIPLE TOOTH EXTRACTIONS     RADIOFREQUENCY ABLATION  1999   WPW; performed at [facility]   TEE WITHOUT CARDIOVERSION N/A 10/07/2016    Procedure: TRANSESOPHAGEAL ECHOCARDIOGRAM (TEE);  Surgeon: Marinus Maw, MD;  Location: [facility] OR;  Service: Cardiovascular;  Laterality: N/A;    Family History  Problem Relation Age of Onset   Heart attack Mother 61   Hypertension Mother    Diabetes Mother    Kidney disease Mother    Breast cancer Mother    Heart attack Father 22   Hypertension Father    Diabetes Father    Stroke Brother    Heart attack Brother 59   Stroke Maternal Grandmother 48   Diabetes Brother    Hypertension Brother    Social History:  reports that he quit smoking about 12 years ago. His smoking use included cigarettes. He started smoking about 52 years ago. He has a 58.6 pack-year smoking history. He quit smokeless tobacco use about 29 years ago.  His smokeless tobacco use included chew. He reports that he does not drink alcohol and does not use drugs.  Allergies: No Known Allergies  No medications prior to admission.    No results found for this or any previous visit (from the past 48 hour(s)). No results found.  Review of Systems  All other systems reviewed and are negative.   Height 6\' 1"  (1.854 m), weight 97.5 kg. Physical Exam   Patient is alert, oriented, no adenopathy, well-dressed, normal affect, normal respiratory effort. Examination patient has an ulcer over the residual limb of the transmetatarsal amputation.  This probes down to the third and fourth metatarsals.  Patient has cellulitis involving the midfoot.  Review of the MRI scan shows osteomyelitis of the third and fourth metatarsals.  There is edema at the base of the first and second metatarsals.  Right foot has callused areas but no cellulitis or open ulcers.  Asessment/Plan . Subacute osteomyelitis of left foot (HCC)       Plan: With the osteomyelitis involving the midfoot.  Patient does not have further foot salvage intervention options available.  Will plan for a transtibial amputation on the left on Friday.  Discharge planning  based on therapy recommendations.  Nadara Mustard, MD 06/17/2023, 6:48 AM

## 2023-06-17 NOTE — Anesthesia Procedure Notes (Signed)
Anesthesia Regional Block: Adductor canal block   Pre-Anesthetic Checklist: , timeout performed,  Correct Patient, Correct Site, Correct Laterality,  Correct Procedure, Correct Position, site marked,  Risks and benefits discussed,  Surgical consent,  Pre-op evaluation,  At surgeon's request and post-op pain management  Laterality: Left  Prep: chloraprep       Needles:  Injection technique: Single-shot  Needle Type: Stimiplex     Needle Length: 9cm  Needle Gauge: 21     Additional Needles:   Procedures:,,,, ultrasound used (permanent image in chart),,    Narrative:  Start time: 06/17/2023 10:09 AM End time: 06/17/2023 10:14 AM Injection made incrementally with aspirations every 5 mL.  Performed by: Personally  Anesthesiologist: Lowella Curb, MD

## 2023-06-17 NOTE — Anesthesia Procedure Notes (Signed)
Anesthesia Regional Block: Popliteal block   Pre-Anesthetic Checklist: , timeout performed,  Correct Patient, Correct Site, Correct Laterality,  Correct Procedure, Correct Position, site marked,  Risks and benefits discussed,  Surgical consent,  Pre-op evaluation,  At surgeon's request and post-op pain management  Laterality: Left  Prep: chloraprep       Needles:  Injection technique: Single-shot  Needle Type: Stimiplex     Needle Length: 9cm  Needle Gauge: 21     Additional Needles:   Procedures:,,,, ultrasound used (permanent image in chart),,    Narrative:  Start time: 06/17/2023 10:10 AM End time: 06/17/2023 10:15 AM Injection made incrementally with aspirations every 5 mL.  Performed by: Personally  Anesthesiologist: Lowella Curb, MD

## 2023-06-17 NOTE — Anesthesia Postprocedure Evaluation (Signed)
Anesthesia Post Note  Patient: Mike Floyd  Procedure(s) Performed: LEFT BELOW KNEE AMPUTATION (Left: Knee)     Patient location during evaluation: PACU Anesthesia Type: General Level of consciousness: awake and alert, oriented and patient cooperative Pain management: pain level controlled Vital Signs Assessment: post-procedure vital signs reviewed and stable Respiratory status: spontaneous breathing, nonlabored ventilation and respiratory function stable Cardiovascular status: blood pressure returned to baseline and stable Postop Assessment: no apparent nausea or vomiting Anesthetic complications: no   There were no known notable events for this encounter.  Last Vitals:  Vitals:   06/17/23 1245 06/17/23 1300  BP: (!) 87/61 90/68  Pulse: 62 77  Resp: 11 12  Temp:    SpO2: 100% 96%    Last Pain:  Vitals:   06/17/23 1300  TempSrc:   PainSc: 0-No pain                 [name]

## 2023-06-18 ENCOUNTER — Encounter (HOSPITAL_COMMUNITY): Payer: Self-pay | Admitting: Orthopedic Surgery

## 2023-06-18 LAB — GLUCOSE, CAPILLARY
Glucose-Capillary: 291 mg/dL — ABNORMAL HIGH (ref 70–99)
Glucose-Capillary: 338 mg/dL — ABNORMAL HIGH (ref 70–99)
Glucose-Capillary: 447 mg/dL — ABNORMAL HIGH (ref 70–99)
Glucose-Capillary: 260 mg/dL — ABNORMAL HIGH (ref 70–99)
Glucose-Capillary: 280 mg/dL — ABNORMAL HIGH (ref 70–99)

## 2023-06-18 LAB — GLUCOSE, RANDOM: Glucose, Bld: 406 mg/dL — ABNORMAL HIGH (ref 70–99)

## 2023-06-18 MED ORDER — INSULIN GLARGINE-YFGN 100 UNIT/ML ~~LOC~~ SOLN
30.0000 [IU] | Freq: Every day | SUBCUTANEOUS | Status: DC
  Administered 2023-06-18: 30 [IU] via SUBCUTANEOUS
  Filled 2023-06-18 (×2): qty 0.3

## 2023-06-18 NOTE — Plan of Care (Signed)
  Problem: Education: Goal: Knowledge of General Education information will improve Description: Including pain rating scale, medication(s)/side effects and non-pharmacologic comfort measures Outcome: Progressing   Problem: Activity: Goal: Risk for activity intolerance will decrease Outcome: Progressing   Problem: Metabolic: Goal: Ability to maintain appropriate glucose levels will improve Outcome: Progressing   Problem: Skin Integrity: Goal: Demonstration of wound healing without infection will improve Outcome: Progressing

## 2023-06-18 NOTE — Plan of Care (Signed)
  Problem: Education: Goal: Knowledge of General Education information will improve Description: Including pain rating scale, medication(s)/side effects and non-pharmacologic comfort measures Outcome: Progressing   Problem: Clinical Measurements: Goal: Will remain free from infection Outcome: Progressing   Problem: Activity: Goal: Risk for activity intolerance will decrease Outcome: Progressing   Problem: Elimination: Goal: Will not experience complications related to bowel motility Outcome: Progressing   Problem: Pain Management: Goal: General experience of comfort will improve Outcome: Progressing

## 2023-06-18 NOTE — Progress Notes (Signed)
Patient ID: Mike Floyd, male   DOB: Jan 06, 1961, 62 y.o.   MRN: [id number] Patient is postoperative day 1 left below-knee amputation.  There is no drainage of the wound VAC canister there is a good suction fit.  Plan for discharge to inpatient versus outpatient rehab based on therapy recommendations.

## 2023-06-18 NOTE — Evaluation (Signed)
Occupational Therapy Evaluation Patient Details Name: Mike Floyd MRN: [id number] DOB: 11/09/1960 Today's Date: 06/18/2023   History of Present Illness Pt is a 62 y/o male admitted 06/17/23 after annual follow up where transmetatarsal amputation that had cellulitis ulceration and drainage.  He s/p L BKA 06/17/23. PMH: poorly controlled type 2 diabetes, GERD, hypertension, dyslipidemia, CAD, L TMA 05/19/22, and neuropathy.   Clinical Impression   Pt typically independent in ADL and mobility. Retired IT sales professional. He is the only driver in his household for wife and MIL. Pt shares responsibility of IADL with wife at home. Today Pt is overall mod A for any LB ADL, and requires seated position for UB ADL as he is dependent on BUE in standing. He is min A +2 safety for transfers and will benefit from short stay at >3 hour daily therapy rehab post-acute to maximize safety and independence in ADL and functional transfers. Pt is motivated and has excellent family support in his wife and children (who live close by and are planning to drive the family in his absence). OT educated in phantom limb pain and desensitization. OT will follow acutely with next session to focus on compensatory strategies for LB ADL and bathroom transfers.      If plan is discharge home, recommend the following: A little help with walking and/or transfers;A little help with bathing/dressing/bathroom;Assistance with cooking/housework;Assist for transportation;Help with stairs or ramp for entrance    Functional Status Assessment  Patient has had a recent decline in their functional status and demonstrates the ability to make significant improvements in function in a reasonable and predictable amount of time.  Equipment Recommendations  Wheelchair (measurements OT);Wheelchair cushion (measurements OT);BSC/3in1    Recommendations for Other Services Rehab consult;PT consult     Precautions / Restrictions Precautions Precautions:  Knee;Fall Precaution Booklet Issued: Yes (comment) Precaution Comments: Reviewed precautions, limb protector, exercises Required Braces or Orthoses: Other Brace (limb guard) Other Brace: limb guard Lt Restrictions Weight Bearing Restrictions: Yes LLE Weight Bearing: Non weight bearing      Mobility Bed Mobility Overal bed mobility: Needs Assistance Bed Mobility: Supine to Sit     Supine to sit: Contact guard     General bed mobility comments: CGA for safety, cues for technique, awareness and protection of Lt residual limb    Transfers Overall transfer level: Needs assistance Equipment used: Rolling walker (2 wheels) Transfers: Sit to/from Stand Sit to Stand: Min assist, +2 safety/equipment, From elevated surface           General transfer comment: Min assist for boost and balance to rise from elevated bed surface. Educated on hand placement during transitions. Mild posterior instability.      Balance Overall balance assessment: Needs assistance Sitting-balance support: No upper extremity supported, Feet supported Sitting balance-Leahy Scale: Good     Standing balance support: Bilateral upper extremity supported, Reliant on assistive device for balance Standing balance-Leahy Scale: Poor                             ADL either performed or assessed with clinical judgement   ADL Overall ADL's : Needs assistance/impaired Eating/Feeding: Independent   Grooming: Wash/dry hands;Wash/dry face;Modified independent;Sitting Grooming Details (indicate cue type and reason): in seated position Upper Body Bathing: Set up;Sitting   Lower Body Bathing: Minimal assistance;Sitting/lateral leans   Upper Body Dressing : Set up;Sitting   Lower Body Dressing: Moderate assistance;Sit to/from stand;Sitting/lateral leans   Toilet Transfer: Minimal assistance;+2  for safety/equipment;Ambulation;Rolling walker (2 wheels);Grab bars   Toileting- Clothing Manipulation and  Hygiene: Contact guard assist;Sitting/lateral lean       Functional mobility during ADLs: Minimal assistance;+2 for safety/equipment;Rolling walker (2 wheels);Cueing for safety General ADL Comments: high fall risk, needs education on compensatory strategies for LB ADL, multimodal cues for safety during transfers     Vision Baseline Vision/History: 1 Wears glasses Ability to See in Adequate Light: 0 Adequate Patient Visual Report: No change from baseline       Perception         Praxis         Pertinent Vitals/Pain Pain Assessment Pain Assessment: 0-10 Pain Score: 2  Pain Location: L leg Pain Descriptors / Indicators: Dull Pain Intervention(s): Monitored during session, Repositioned, Other (comment) (moved line for wound vac)     Extremity/Trunk Assessment Upper Extremity Assessment Upper Extremity Assessment: Overall WFL for tasks assessed   Lower Extremity Assessment Lower Extremity Assessment: Defer to PT evaluation LLE Deficits / Details: Lt BKA, pain as expected post -op   Cervical / Trunk Assessment Cervical / Trunk Assessment: Normal   Communication Communication Communication: Hearing impairment   Cognition Arousal: Alert Behavior During Therapy: WFL for tasks assessed/performed Overall Cognitive Status: Within Functional Limits for tasks assessed                                       General Comments  Edcucated Pt on desensitization techniques for phantom limb pain    Exercises     Shoulder Instructions      Home Living Family/patient expects to be discharged to:: Private residence Living Arrangements: Spouse/significant other;Other (Comment) (Secretary/administrator) Available Help at Discharge: Family;Available 24 hours/day (wife cannot drive or work, daughter plans on driving him around) Type of Home: Mobile home Home Access: Stairs to enter Secretary/administrator of Steps: 3 Entrance Stairs-Rails: Right;Can reach both;Left Home Layout:  One level     Bathroom Shower/Tub: Producer, television/film/video: Standard Bathroom Accessibility: Yes   Home Equipment: Teacher, English as a foreign language (2 wheels);Crutches;Cane - single point          Prior Functioning/Environment Prior Level of Function : Independent/Modified Independent;Driving             Mobility Comments: amb with cane          OT Problem List: Decreased activity tolerance;Impaired balance (sitting and/or standing);Decreased safety awareness;Decreased knowledge of use of DME or AE;Decreased knowledge of precautions;Impaired sensation;Pain      OT Treatment/Interventions: Self-care/ADL training;Therapeutic exercise;DME and/or AE instruction;Therapeutic activities;Patient/family education;Balance training    OT Goals(Current goals can be found in the care plan section) Acute Rehab OT Goals Patient Stated Goal: be as independent and safe as possible OT Goal Formulation: With patient Time For Goal Achievement: 07/02/23 Potential to Achieve Goals: Good ADL Goals Pt Will Perform Grooming: with modified independence;sitting Pt Will Perform Upper Body Dressing: with modified independence;sitting Pt Will Perform Lower Body Dressing: with modified independence;sit to/from stand;sitting/lateral leans Pt Will Transfer to Toilet: with modified independence;ambulating Pt Will Perform Toileting - Clothing Manipulation and hygiene: with modified independence;sitting/lateral leans Pt Will Perform Tub/Shower Transfer: Shower transfer;with modified independence;shower seat;rolling walker  OT Frequency: Min 1X/week    Co-evaluation PT/OT/SLP Co-Evaluation/Treatment: Yes Reason for Co-Treatment: For patient/therapist safety;To address functional/ADL transfers PT goals addressed during session: Mobility/safety with mobility;Balance;Proper use of DME;Strengthening/ROM OT goals addressed during session: ADL's and self-care;Strengthening/ROM  AM-PAC OT "6 Clicks"  Daily Activity     Outcome Measure Help from another person eating meals?: None Help from another person taking care of personal grooming?: A Little Help from another person toileting, which includes using toliet, bedpan, or urinal?: A Lot Help from another person bathing (including washing, rinsing, drying)?: A Little Help from another person to put on and taking off regular upper body clothing?: None Help from another person to put on and taking off regular lower body clothing?: A Lot 6 Click Score: 18   End of Session Equipment Utilized During Treatment: Gait belt;Rolling walker (2 wheels);Other (comment) (wound vac) Nurse Communication: Mobility status;Precautions;Weight bearing status  Activity Tolerance: Patient tolerated treatment well Patient left: in chair;with call bell/phone within reach;with chair alarm set;with SCD's reapplied  OT Visit Diagnosis: Other abnormalities of gait and mobility (R26.89);Unsteadiness on feet (R26.81);Muscle weakness (generalized) (M62.81);Pain Pain - Right/Left: Left Pain - part of body: Leg                Time: 1220-1250 OT Time Calculation (min): 30 min Charges:  OT General Charges $OT Visit: 1 Visit OT Evaluation $OT Eval Moderate Complexity: 1 Mod  Nyoka Cowden OTR/L Acute Rehabilitation Services Office: [phone]  Evern Bio [facility] 06/18/2023, 5:24 PM

## 2023-06-18 NOTE — Evaluation (Addendum)
Physical Therapy Evaluation Patient Details Name: Mike Floyd MRN: [id number] DOB: 1960/10/09 Today's Date: 06/18/2023  History of Present Illness  Pt is a 62 y/o male admitted 06/17/23 after annual follow up where transmetatarsal amputation that had cellulitis ulceration and drainage.  He s/p L BKA 06/17/23. PMH: poorly controlled type 2 diabetes, GERD, hypertension, dyslipidemia, CAD, L TMA 05/19/22, and neuropathy.   Clinical Impression  Patient is s/p above surgery presenting with functional limitations due to the deficits listed below (see PT Problem List). Previously independent and driving. Pt required min assist to ambulate with RW, demonstrating posterior instability with intermittent assist needed to steady. Remains High Fall Risk. Handout provided and reviewed precautions and LE exercises. Feel patient would progress rapidly with intensive inpatient follow up therapy, >3 hours/day, reducing fall risk and improving independence prior to returning home. He has 24/7 assist at home; daughter plans to drive pt to appointments as needed. Patient will benefit from acute skilled PT to increase their independence and safety with mobility to facilitate discharge.         If plan is discharge home, recommend the following: A little help with walking and/or transfers;A little help with bathing/dressing/bathroom;Assistance with cooking/housework;Assist for transportation   Can travel by private vehicle        Equipment Recommendations None recommended by PT  Recommendations for Other Services  Rehab consult    Functional Status Assessment Patient has had a recent decline in their functional status and demonstrates the ability to make significant improvements in function in a reasonable and predictable amount of time.     Precautions / Restrictions Precautions Precautions: Knee;Fall Precaution Booklet Issued: Yes (comment) Precaution Comments: Reviewed precautions, limb protector,  exercises Required Braces or Orthoses: Other Brace (limb guard) Other Brace: limb guard Lt Restrictions Weight Bearing Restrictions: Yes LLE Weight Bearing: Non weight bearing      Mobility  Bed Mobility Overal bed mobility: Needs Assistance Bed Mobility: Supine to Sit     Supine to sit: Contact guard     General bed mobility comments: CGA for safety, cues for technique, awareness and protection of Lt residual limb    Transfers Overall transfer level: Needs assistance Equipment used: Rolling walker (2 wheels) Transfers: Sit to/from Stand Sit to Stand: Min assist, +2 safety/equipment, From elevated surface           General transfer comment: Min assist for boost and balance to rise from elevated bed surface. Educated on hand placement during transitions. Mild posterior instability.    Ambulation/Gait Ambulation/Gait assistance: Min assist, +2 safety/equipment Gait Distance (Feet): 40 Feet Assistive device: Rolling walker (2 wheels) Gait Pattern/deviations:  (hop) Gait velocity: dec Gait velocity interpretation: <1.31 ft/sec, indicative of household ambulator   General Gait Details: Educated on safe AD use with RW for support. Pt with reported hx of peripheral neuropathy on Rt foot making balance challenging. Leans posteriorly, requiring min assist intermittently with +2 for chair follow. Step pattern improved with practice and cues, emphasizing stepping into center of RW and avoid over stepping which causes posterior LOB.  Stairs            Wheelchair Mobility     Tilt Bed    Modified Rankin (Stroke Patients Only)       Balance Overall balance assessment: Needs assistance Sitting-balance support: No upper extremity supported, Feet supported Sitting balance-Leahy Scale: Good     Standing balance support: Bilateral upper extremity supported, Reliant on assistive device for balance Standing balance-Leahy Scale: Poor  Pertinent Vitals/Pain Pain Assessment Pain Assessment: 0-10 Pain Score: 2  Pain Location: L leg Pain Descriptors / Indicators: Dull Pain Intervention(s): Monitored during session, Repositioned, Limited activity within patient's tolerance    Home Living Family/patient expects to be discharged to:: Private residence Living Arrangements: Spouse/significant other;Other (Comment) (Secretary/administrator) Available Help at Discharge: Family;Available 24 hours/day (wife cannot drive or work, daughter plans on driving him around) Type of Home: Mobile home Home Access: Stairs to enter Entrance Stairs-Rails: Right;Can reach Technical sales engineer of Steps: 3   Home Layout: One level Home Equipment: Teacher, English as a foreign language (2 wheels);Crutches;Cane - single point      Prior Function Prior Level of Function : Independent/Modified Independent;Driving             Mobility Comments: amb with cane       Extremity/Trunk Assessment   Upper Extremity Assessment Upper Extremity Assessment: Defer to OT evaluation    Lower Extremity Assessment Lower Extremity Assessment: LLE deficits/detail LLE Deficits / Details: Lt BKA, pain as expected post -op       Communication   Communication Communication: Hearing impairment  Cognition Arousal: Alert Behavior During Therapy: WFL for tasks assessed/performed Overall Cognitive Status: Within Functional Limits for tasks assessed                                          General Comments      Exercises Amputee Exercises Quad Sets: Strengthening, Left, 5 reps, Seated Gluteal Sets: Strengthening, Both, 5 reps, Seated Towel Squeeze: Strengthening, Both, 5 reps, Seated Hip ABduction/ADduction: Strengthening, Left, 5 reps, Seated Other Exercises Other Exercises: Reviewed BKA post-op exercise handout with pt.   Assessment/Plan    PT Assessment Patient needs continued PT services  PT Problem List Decreased  strength;Decreased range of motion;Decreased activity tolerance;Decreased balance;Decreased mobility;Decreased knowledge of use of DME;Decreased knowledge of precautions;Impaired sensation;Pain       PT Treatment Interventions DME instruction;Gait training;Stair training;Functional mobility training;Therapeutic activities;Therapeutic exercise;Balance training;Neuromuscular re-education;Patient/family education;Wheelchair mobility training;Modalities    PT Goals (Current goals can be found in the Care Plan section)  Acute Rehab PT Goals Patient Stated Goal: Be able to care for himself and move safely. PT Goal Formulation: With patient Time For Goal Achievement: 07/02/23 Potential to Achieve Goals: Good    Frequency Min 1X/week     Co-evaluation PT/OT/SLP Co-Evaluation/Treatment: Yes Reason for Co-Treatment: For patient/therapist safety;To address functional/ADL transfers PT goals addressed during session: Mobility/safety with mobility;Balance;Proper use of DME;Strengthening/ROM         AM-PAC PT "6 Clicks" Mobility  Outcome Measure Help needed turning from your back to your side while in a flat bed without using bedrails?: None Help needed moving from lying on your back to sitting on the side of a flat bed without using bedrails?: A Little Help needed moving to and from a bed to a chair (including a wheelchair)?: A Little Help needed standing up from a chair using your arms (e.g., wheelchair or bedside chair)?: A Little Help needed to walk in hospital room?: A Little Help needed climbing 3-5 steps with a railing? : Total 6 Click Score: 17    End of Session Equipment Utilized During Treatment: Gait belt (Limb guard Lt) Activity Tolerance: Patient tolerated treatment well Patient left: in chair;with call bell/phone within reach;with chair alarm set;with SCD's reapplied Nurse Communication: Mobility status PT Visit Diagnosis: Unsteadiness on feet (R26.81);Other abnormalities of  gait  and mobility (R26.89);Difficulty in walking, not elsewhere classified (R26.2);Pain Pain - Right/Left: Left Pain - part of body: Leg    Time: 1220-1250 PT Time Calculation (min) (ACUTE ONLY): 30 min   Charges:   PT Evaluation $PT Eval Low Complexity: 1 Low   PT General Charges $$ ACUTE PT VISIT: 1 Visit         Kathlyn Sacramento, PT, DPT [facility]  Rehabilitation Services Physical Therapist Office: [phone] Website: [email or web address]   Berton Mount 06/18/2023, 2:09 PM

## 2023-06-19 LAB — GLUCOSE, CAPILLARY
Glucose-Capillary: 329 mg/dL — ABNORMAL HIGH (ref 70–99)
Glucose-Capillary: 298 mg/dL — ABNORMAL HIGH (ref 70–99)
Glucose-Capillary: 257 mg/dL — ABNORMAL HIGH (ref 70–99)
Glucose-Capillary: 250 mg/dL — ABNORMAL HIGH (ref 70–99)

## 2023-06-19 MED ORDER — INSULIN GLARGINE-YFGN 100 UNIT/ML ~~LOC~~ SOLN
40.0000 [IU] | Freq: Every day | SUBCUTANEOUS | Status: DC
  Administered 2023-06-19 – 2023-06-20 (×2): 40 [IU] via SUBCUTANEOUS
  Filled 2023-06-19 (×3): qty 0.4

## 2023-06-19 NOTE — Inpatient Diabetes Management (Signed)
Inpatient Diabetes Program Recommendations  AACE/ADA: New Consensus Statement on Inpatient Glycemic Control (2015)  Target Ranges:  Prepandial:   less than 140 mg/dL      Peak postprandial:   less than 180 mg/dL (1-2 hours)      Critically ill patients:  140 - 180 mg/dL   Lab Results  Component Value Date   GLUCAP 329 (H) 06/19/2023   HGBA1C 7.7 (H) 06/17/2023    Review of Glycemic Control  Latest Reference Range & Units 06/18/23 08:02 06/18/23 11:37 06/18/23 16:40 06/18/23 21:30 06/19/23 08:18  Glucose-Capillary 70 - 99 mg/dL 161 (H)  Novolog 15 units 447 (H)  Novolog 19 units 260 (H)  Novolog 12 units 280 (H) 329 (H)  Novolog 15 units   Diabetes history: DM 2 Outpatient Diabetes medications: lantus 20 units qhs, Novolog 70/30 52 units bid Current orders for Inpatient glycemic control:  Semglee 30 units qhs Novolog 0-15 units tid Novolog 4 units tid meals coverage  Note patient received Decadron 10 mg 2 days ago resulting in increased glucose trends.  Inpatient Diabetes Program Recommendations:    -   Increase Semglee to 40 units -   Increase Novolog meal coverage to 8 units tid if eating >50% of meals  Thanks, Christena Deem RN, MSN, BC-ADM Inpatient Diabetes Coordinator Team Pager [phone] (8a-5p)

## 2023-06-19 NOTE — Progress Notes (Signed)
Patient ID: Mike Floyd, male   DOB: 1961-07-17, 62 y.o.   MRN: [id number] The patient is postoperative day #2 status post a left below-knee amputation.  His stump dressing and brace are intact.  The incisional VAC has a good seal.  There is no significant fluid in the canister.  I did review notes from therapy.  He remains a fall risk and certainly will benefit from continued therapy while he is an inpatient.

## 2023-06-19 NOTE — Plan of Care (Signed)
  Problem: Education: Goal: Knowledge of General Education information will improve Description: Including pain rating scale, medication(s)/side effects and non-pharmacologic comfort measures Outcome: Progressing   Problem: Health Behavior/Discharge Planning: Goal: Ability to manage health-related needs will improve Outcome: Progressing   Problem: Clinical Measurements: Goal: Ability to maintain clinical measurements within normal limits will improve Outcome: Progressing Goal: Will remain free from infection Outcome: Progressing Goal: Diagnostic test results will improve Outcome: Progressing Goal: Respiratory complications will improve Outcome: Progressing Goal: Cardiovascular complication will be avoided Outcome: Progressing   Problem: Activity: Goal: Risk for activity intolerance will decrease Outcome: Progressing   Problem: Nutrition: Goal: Adequate nutrition will be maintained Outcome: Progressing   Problem: Coping: Goal: Level of anxiety will decrease Outcome: Progressing   Problem: Elimination: Goal: Will not experience complications related to bowel motility Outcome: Progressing Goal: Will not experience complications related to urinary retention Outcome: Progressing   Problem: Pain Management: Goal: General experience of comfort will improve Outcome: Progressing   Problem: Safety: Goal: Ability to remain free from injury will improve Outcome: Progressing   Problem: Skin Integrity: Goal: Risk for impaired skin integrity will decrease Outcome: Progressing   Problem: Education: Goal: Ability to describe self-care measures that may prevent or decrease complications (Diabetes Survival Skills Education) will improve Outcome: Progressing Goal: Individualized Educational Video(s) Outcome: Progressing   Problem: Coping: Goal: Ability to adjust to condition or change in health will improve Outcome: Progressing   Problem: Fluid Volume: Goal: Ability to  maintain a balanced intake and output will improve Outcome: Progressing   Problem: Health Behavior/Discharge Planning: Goal: Ability to identify and utilize available resources and services will improve Outcome: Progressing Goal: Ability to manage health-related needs will improve Outcome: Progressing   Problem: Metabolic: Goal: Ability to maintain appropriate glucose levels will improve Outcome: Progressing   Problem: Nutritional: Goal: Maintenance of adequate nutrition will improve Outcome: Progressing Goal: Progress toward achieving an optimal weight will improve Outcome: Progressing   Problem: Skin Integrity: Goal: Risk for impaired skin integrity will decrease Outcome: Progressing   Problem: Tissue Perfusion: Goal: Adequacy of tissue perfusion will improve Outcome: Progressing   Problem: Education: Goal: Knowledge of the prescribed therapeutic regimen will improve Outcome: Progressing Goal: Ability to verbalize activity precautions or restrictions will improve Outcome: Progressing Goal: Understanding of discharge needs will improve Outcome: Progressing   Problem: Activity: Goal: Ability to perform//tolerate increased activity and mobilize with assistive devices will improve Outcome: Progressing   Problem: Clinical Measurements: Goal: Postoperative complications will be avoided or minimized Outcome: Progressing   Problem: Self-Care: Goal: Ability to meet self-care needs will improve Outcome: Progressing   Problem: Self-Concept: Goal: Ability to maintain and perform role responsibilities to the fullest extent possible will improve Outcome: Progressing   Problem: Pain Management: Goal: Pain level will decrease with appropriate interventions Outcome: Progressing   Problem: Skin Integrity: Goal: Demonstration of wound healing without infection will improve Outcome: Progressing

## 2023-06-19 NOTE — Progress Notes (Signed)
Inpatient Rehab Admissions Coordinator:    I attempted to reach Pt. To discuss potential CIR admit but was not able to reach him. I will go see him tomorrow to discuss. Note that payor is unlikley to approve CIR, so Pt. May ultimately need an alternate d/c plan.  Megan Salon, MS, CCC-SLP Rehab Admissions Coordinator  [phone] (celll) [phone] (office)

## 2023-06-19 NOTE — Plan of Care (Signed)
  Problem: Health Behavior/Discharge Planning: Goal: Ability to manage health-related needs will improve Outcome: Progressing   Problem: Activity: Goal: Risk for activity intolerance will decrease Outcome: Progressing   Problem: Pain Management: Goal: Pain level will decrease with appropriate interventions Outcome: Progressing

## 2023-06-20 LAB — SURGICAL PATHOLOGY

## 2023-06-20 LAB — GLUCOSE, CAPILLARY
Glucose-Capillary: 296 mg/dL — ABNORMAL HIGH (ref 70–99)
Glucose-Capillary: 265 mg/dL — ABNORMAL HIGH (ref 70–99)
Glucose-Capillary: 247 mg/dL — ABNORMAL HIGH (ref 70–99)
Glucose-Capillary: 225 mg/dL — ABNORMAL HIGH (ref 70–99)

## 2023-06-20 NOTE — Inpatient Diabetes Management (Signed)
Inpatient Diabetes Program Recommendations  AACE/ADA: New Consensus Statement on Inpatient Glycemic Control (2015)  Target Ranges:  Prepandial:   less than 140 mg/dL      Peak postprandial:   less than 180 mg/dL (1-2 hours)      Critically ill patients:  140 - 180 mg/dL   Lab Results  Component Value Date   GLUCAP 265 (H) 06/20/2023   HGBA1C 7.7 (H) 06/17/2023    Review of Glycemic Control  Diabetes history: type 2 Outpatient Diabetes medications: 70/30 insulin 52 units BID, Lantus 20 units at HS (per patient takes both) Current orders for Inpatient glycemic control: Semglee 40 units at HS< Novolog 0-15 units correction scale TID, Novolog 4 units TID with meals  Inpatient Diabetes Program Recommendations:   Spoke with patient to confirm what insulin he is taking at home. He does take both 70/30 insulin 52 units BID and Lantus 20 units at HS. Noted that his HgbA1C is 7.7% which he said is much better than it has been. He was diagnosed with diabetes about 15 years ago. States that he does check his blood sugars at home. His wife lives with him.   He seems to be very interested in taking care of his diabetes. Will continue to monitor blood sugars while in the hospital.  Smith Mince RN BSN CDE Diabetes Coordinator Pager: [phone]  8am-5pm

## 2023-06-20 NOTE — Progress Notes (Signed)
Patient ID: Mike Floyd, male   DOB: 03/10/1961, 62 y.o.   MRN: [id number] Patient is a 62 year old gentleman status post left below-knee amputation.  Patient has been evaluated for inpatient rehab and is not felt to be a good candidate for inpatient rehab.  Plan for discharge to skilled nursing.

## 2023-06-20 NOTE — Inpatient Diabetes Management (Signed)
Inpatient Diabetes Program Recommendations  AACE/ADA: New Consensus Statement on Inpatient Glycemic Control (2015)  Target Ranges:  Prepandial:   less than 140 mg/dL      Peak postprandial:   less than 180 mg/dL (1-2 hours)      Critically ill patients:  140 - 180 mg/dL   Lab Results  Component Value Date   GLUCAP 265 (H) 06/20/2023   HGBA1C 7.7 (H) 06/17/2023    Latest Reference Range & Units 06/19/23 11:18 06/19/23 16:11 06/19/23 23:23 06/20/23 08:07 06/20/23 11:15  Glucose-Capillary 70 - 99 mg/dL 413 (H) 244 (H) 010 (H) 296 (H) 265 (H)  (H): Data is abnormally high Review of Glycemic Control  Diabetes history: type 2 Outpatient Diabetes medications: 70/30 insulin 52 units BID, Lantus 20 units at HS (confirmed by patient) Current orders for Inpatient glycemic control: Semglee 40 units daily, Novolog 0-15 units correction scale TID, Novolog 4 units TID with meals  Inpatient Diabetes Program Recommendations:   Noted that blood sugars have been greater than 200 mg/dl.   Recommend increasing Novolog meal coverage to 8 units TID if blood sugars continue to be elevated. May need to titrate Semglee dosage up to 45 units daily.  Smith Mince RN BSN CDE Diabetes Coordinator Pager: [phone]  8am-5pm

## 2023-06-20 NOTE — Progress Notes (Signed)
    Durable Medical Equipment  (From admission, onward)           Start     Ordered   06/20/23 1303  For home use only DME lightweight manual wheelchair with seat cushion  Once       Comments: Patient suffers from S/P L BKA which impairs their ability to perform daily activities like bathing in the home.  A walker will not resolve  issue with performing activities of daily living. A wheelchair will allow patient to safely perform daily activities. Patient is not able to propel themselves in the home using a standard weight wheelchair due to general weakness. Patient can self propel in the lightweight wheelchair. Length of need 12 months . Accessories: elevating leg rests (ELRs), wheel locks, extensions and anti-tippers.   06/20/23 1304

## 2023-06-20 NOTE — Care Management Important Message (Signed)
Important Message  Patient Details  Name: Mike Floyd MRN: [id number] Date of Birth: Jun 19, 1961   Important Message Given:  Yes - Medicare IM     Sherilyn Banker 06/20/2023, 2:40 PM

## 2023-06-20 NOTE — Progress Notes (Signed)
Occupational Therapy Treatment Patient Details Name: Mike Floyd MRN: [id number] DOB: Sep 15, 1960 Today's Date: 06/20/2023   History of present illness Pt is a 62 y/o male admitted 06/17/23 after annual follow up where transmetatarsal amputation that had cellulitis ulceration and drainage.  He s/p L BKA 06/17/23. PMH: poorly controlled type 2 diabetes, GERD, hypertension, dyslipidemia, CAD, L TMA 05/19/22, and neuropathy.   OT comments  Received in supine and eager to participate with OT. Patient able to perform bathing tasks seated on EOB with setup and lateral leaning for peri care bathing. Patient able to ambulate to bathroom with min assist and cues for safety with walker use and patient able to perform toilet hygiene seated. Patient asked to return to bed at end of session. Patient will benefit from intensive inpatient follow up therapy, >3 hours/day to continue to address self care and functional transfers. Acute OT to continue to follow.       If plan is discharge home, recommend the following:  A little help with walking and/or transfers;A little help with bathing/dressing/bathroom;Assistance with cooking/housework;Assist for transportation;Help with stairs or ramp for entrance   Equipment Recommendations  Wheelchair (measurements OT);Wheelchair cushion (measurements OT);BSC/3in1    Recommendations for Other Services      Precautions / Restrictions Precautions Precautions: Knee;Fall Precaution Booklet Issued: Yes (comment) Precaution Comments: reviewed precautions Required Braces or Orthoses: Other Brace (limb guard) Other Brace: limb guard Lt Restrictions Weight Bearing Restrictions: Yes LLE Weight Bearing: Non weight bearing       Mobility Bed Mobility Overal bed mobility: Needs Assistance Bed Mobility: Supine to Sit, Sit to Supine     Supine to sit: Contact guard Sit to supine: Contact guard assist   General bed mobility comments: CGA for safety     Transfers Overall transfer level: Needs assistance Equipment used: Rolling walker (2 wheels) Transfers: Sit to/from Stand, Bed to chair/wheelchair/BSC Sit to Stand: Min assist           General transfer comment: cues for hand placement and safety with walker use     Balance Overall balance assessment: Needs assistance Sitting-balance support: No upper extremity supported, Feet supported Sitting balance-Leahy Scale: Good Sitting balance - Comments: able to perform bathing tasks seated on EOB   Standing balance support: Bilateral upper extremity supported, Reliant on assistive device for balance Standing balance-Leahy Scale: Poor Standing balance comment: reliant on BUE support                           ADL either performed or assessed with clinical judgement   ADL Overall ADL's : Needs assistance/impaired     Grooming: Wash/dry hands;Wash/dry face;Set up;Sitting Grooming Details (indicate cue type and reason): on EOB Upper Body Bathing: Set up;Sitting Upper Body Bathing Details (indicate cue type and reason): on EOB Lower Body Bathing: Minimal assistance;Sitting/lateral leans Lower Body Bathing Details (indicate cue type and reason): on EOB Upper Body Dressing : Set up;Sitting       Toilet Transfer: Minimal assistance;BSC/3in1;Regular Toilet;Rolling walker (2 wheels) (3n1 over commode) Toilet Transfer Details (indicate cue type and reason): cues for walker safety Toileting- Clothing Manipulation and Hygiene: Supervision/safety;Sitting/lateral lean         General ADL Comments: cues for safety with walker use    Extremity/Trunk Assessment              Vision       Perception     Praxis      Cognition Arousal:  Alert Behavior During Therapy: WFL for tasks assessed/performed Overall Cognitive Status: Within Functional Limits for tasks assessed                                          Exercises      Shoulder  Instructions       General Comments      Pertinent Vitals/ Pain       Pain Assessment Pain Assessment: Faces Faces Pain Scale: Hurts a little bit Pain Location: L leg Pain Descriptors / Indicators: Grimacing Pain Intervention(s): Monitored during session, Repositioned  Home Living                                          Prior Functioning/Environment              Frequency  Min 1X/week        Progress Toward Goals  OT Goals(current goals can now be found in the care plan section)  Progress towards OT goals: Progressing toward goals  Acute Rehab OT Goals Patient Stated Goal: increase independence OT Goal Formulation: With patient Time For Goal Achievement: 07/02/23 Potential to Achieve Goals: Good ADL Goals Pt Will Perform Grooming: with modified independence;sitting Pt Will Perform Upper Body Dressing: with modified independence;sitting Pt Will Perform Lower Body Dressing: with modified independence;sit to/from stand;sitting/lateral leans Pt Will Transfer to Toilet: with modified independence;ambulating Pt Will Perform Toileting - Clothing Manipulation and hygiene: with modified independence;sitting/lateral leans Pt Will Perform Tub/Shower Transfer: Shower transfer;with modified independence;shower seat;rolling walker  Plan      Co-evaluation                 AM-PAC OT "6 Clicks" Daily Activity     Outcome Measure   Help from another person eating meals?: None Help from another person taking care of personal grooming?: A Little Help from another person toileting, which includes using toliet, bedpan, or urinal?: A Lot Help from another person bathing (including washing, rinsing, drying)?: A Little Help from another person to put on and taking off regular upper body clothing?: None Help from another person to put on and taking off regular lower body clothing?: A Lot 6 Click Score: 18    End of Session Equipment Utilized During  Treatment: Gait belt;Rolling walker (2 wheels);Other (comment) (wound vac)  OT Visit Diagnosis: Other abnormalities of gait and mobility (R26.89);Unsteadiness on feet (R26.81);Muscle weakness (generalized) (M62.81);Pain Pain - Right/Left: Left Pain - part of body: Leg   Activity Tolerance Patient tolerated treatment well   Patient Left in bed;with call bell/phone within reach   Nurse Communication Mobility status;Precautions;Weight bearing status        Time: 1610-9604 OT Time Calculation (min): 22 min  Charges: OT General Charges $OT Visit: 1 Visit OT Treatments $Self Care/Home Management : 8-22 mins  Alfonse Flavors, OTA Acute Rehabilitation Services  Office [phone]   Dewain Penning 06/20/2023, 8:14 AM

## 2023-06-20 NOTE — Progress Notes (Signed)
PT Cancellation Note  Patient Details Name: Mike Floyd MRN: [id number] DOB: 1961-05-14   Cancelled Treatment:    Reason Eval/Treat Not Completed: (P) Patient declined, no reason specified (pt requesting therapist return in AM to see him prior to DC for further wheelchair transfer instruction, he is attempting to order dinner.) Will continue efforts per PT plan of care as schedule permits.   Dorathy Kinsman Dericka Ostenson 06/20/2023, 6:51 PM

## 2023-06-20 NOTE — Plan of Care (Signed)
  Problem: Education: Goal: Knowledge of General Education information will improve Description: Including pain rating scale, medication(s)/side effects and non-pharmacologic comfort measures Outcome: Not Progressing   Problem: Health Behavior/Discharge Planning: Goal: Ability to manage health-related needs will improve Outcome: Not Progressing   Problem: Clinical Measurements: Goal: Ability to maintain clinical measurements within normal limits will improve Outcome: Not Progressing Goal: Will remain free from infection Outcome: Not Progressing Goal: Diagnostic test results will improve Outcome: Not Progressing Goal: Respiratory complications will improve Outcome: Not Progressing Goal: Cardiovascular complication will be avoided Outcome: Not Progressing   Problem: Activity: Goal: Risk for activity intolerance will decrease Outcome: Not Progressing   Problem: Nutrition: Goal: Adequate nutrition will be maintained Outcome: Not Progressing   Problem: Coping: Goal: Level of anxiety will decrease Outcome: Not Progressing   Problem: Elimination: Goal: Will not experience complications related to bowel motility Outcome: Not Progressing Goal: Will not experience complications related to urinary retention Outcome: Not Progressing   Problem: Pain Management: Goal: General experience of comfort will improve Outcome: Not Progressing   Problem: Safety: Goal: Ability to remain free from injury will improve Outcome: Not Progressing   Problem: Skin Integrity: Goal: Risk for impaired skin integrity will decrease Outcome: Not Progressing   Problem: Education: Goal: Ability to describe self-care measures that may prevent or decrease complications (Diabetes Survival Skills Education) will improve Outcome: Not Progressing Goal: Individualized Educational Video(s) Outcome: Not Progressing   Problem: Coping: Goal: Ability to adjust to condition or change in health will  improve Outcome: Not Progressing   Problem: Fluid Volume: Goal: Ability to maintain a balanced intake and output will improve Outcome: Not Progressing   Problem: Health Behavior/Discharge Planning: Goal: Ability to identify and utilize available resources and services will improve Outcome: Not Progressing Goal: Ability to manage health-related needs will improve Outcome: Not Progressing   Problem: Metabolic: Goal: Ability to maintain appropriate glucose levels will improve Outcome: Not Progressing   Problem: Nutritional: Goal: Maintenance of adequate nutrition will improve Outcome: Not Progressing Goal: Progress toward achieving an optimal weight will improve Outcome: Not Progressing   Problem: Skin Integrity: Goal: Risk for impaired skin integrity will decrease Outcome: Not Progressing   Problem: Tissue Perfusion: Goal: Adequacy of tissue perfusion will improve Outcome: Not Progressing   Problem: Education: Goal: Knowledge of the prescribed therapeutic regimen will improve Outcome: Not Progressing Goal: Ability to verbalize activity precautions or restrictions will improve Outcome: Not Progressing Goal: Understanding of discharge needs will improve Outcome: Not Progressing   Problem: Activity: Goal: Ability to perform//tolerate increased activity and mobilize with assistive devices will improve Outcome: Not Progressing   Problem: Clinical Measurements: Goal: Postoperative complications will be avoided or minimized Outcome: Not Progressing   Problem: Self-Care: Goal: Ability to meet self-care needs will improve Outcome: Not Progressing   Problem: Self-Concept: Goal: Ability to maintain and perform role responsibilities to the fullest extent possible will improve Outcome: Not Progressing   Problem: Pain Management: Goal: Pain level will decrease with appropriate interventions Outcome: Not Progressing   Problem: Skin Integrity: Goal: Demonstration of wound  healing without infection will improve Outcome: Not Progressing

## 2023-06-20 NOTE — TOC Progression Note (Signed)
Transition of Care [facility]) - Progression Note    Patient Details  Name: Mike Floyd MRN: [id number] Date of Birth: 1961/02/06  Transition of Care [facility]) CM/SW Contact  Epifanio Lesches, RN Phone Number: 06/20/2023, 12:42 PM  Clinical Narrative:    Pt declining ? CIR admit . States would like to d/c home with home health services. Pt without provider choice. Referral made with Amy / [facility] and accepted pending MD order. Already has RW, BSC, and crutches@ home. OT recommending DME: W/C. Order place for MD to cosign. Referral made with  [facility] for W/C. Equipment will be delivered to beside prior to d/c. Wife to assist with care and provide the supervision needed once d/c.  TOC team following and will assist with needs...       Expected Discharge Plan and Services     HH Arranged: PT, OT HH Agency: [facility] Date [facility] Agency Contacted: 06/20/23 Time HH Agency Contacted: 1239 Representative spoke with at [facility] Agency: Amy   Social Determinants of Health (SDOH) Interventions SDOH Screenings   Food Insecurity: No Food Insecurity (06/17/2023)  Housing: Low Risk  (06/17/2023)  Transportation Needs: No Transportation Needs (06/17/2023)  Utilities: Not At Risk (06/17/2023)  Tobacco Use: Medium Risk (06/17/2023)    Readmission Risk Interventions    05/20/2022    4:09 PM  Readmission Risk Prevention Plan  Post Dischage Appt Complete  Medication Screening Complete  Transportation Screening Complete

## 2023-06-20 NOTE — Progress Notes (Signed)
Inpatient Rehab Admissions Coordinator:    I spoke with Pt. Regarding potential CIR admit. He states that he prefers to do his rehab through home health and that he has 24/7 support from his wife at home. I will not pursue for CIR.   Megan Salon, MS, CCC-SLP Rehab Admissions Coordinator  [phone] (celll) [phone] (office)

## 2023-06-21 LAB — GLUCOSE, CAPILLARY
Glucose-Capillary: 276 mg/dL — ABNORMAL HIGH (ref 70–99)
Glucose-Capillary: 281 mg/dL — ABNORMAL HIGH (ref 70–99)

## 2023-06-21 MED ORDER — OXYCODONE-ACETAMINOPHEN 5-325 MG PO TABS: 1.0000 | ORAL_TABLET | ORAL | 0 refills | Status: DC | PRN

## 2023-06-21 NOTE — Discharge Summary (Signed)
Physician Discharge Summary  Patient ID: Mike Floyd MRN: [id number] DOB/AGE: 64/30/1962 64 y.o.  Admit date: 06/17/2023 Discharge date: 06/21/2023  Admission Diagnoses:  Principal Problem:   Below-knee amputation of left lower extremity [facility])   Discharge Diagnoses:  Same  Past Medical History:  Diagnosis Date   AICD (automatic cardioverter/defibrillator) present 11/2007   Removed in 2018, a. 11/2007 SJM Current VR - single lead ICD  - Removed 2018 - "it was burning me"   Anxiety    CAD (coronary artery disease)    non-obstructive CAD by Cor CT in 2020 // Myoview 3/22: EF 57, small inf-sept defect c/w scar, no ischemia; low risk      Chest pain    a. 10/2007 Cath:  normal Cors.   CKD (chronic kidney disease), stage II    DDD (degenerative disc disease), lumbar    Diabetes mellitus DX: 2010   type 2   Erosive esophagitis    a. per EGD (08/2011), Dr. Karilyn Cota - Erosive reflux esophagitis improved but not completely healed since previous EGD 3 years ago. Bx showing  ulcerated gatroesophageal junction mucosa. negative for H. pylori   GERD (gastroesophageal reflux disease)    Gout    Hearing deficit    a. wear bilateral hearing aides   History of hiatal hernia    History of kidney stones    passed stones, no surgery   Hypertension    Mildly dilatd aortic root (HCC)    CMR 4/22: EF 52, no LGE; d/w Dr. Lorretta Harp root 38 mm (mildly dilated)   Myocardial infarction [facility]) 2011   Neuropathy    Feet and legs   Nonischemic dilated cardiomyopathy (HCC)    a. H/O EF as low as 35-40% by LV gram 10/2007;  b. Echo 02/2011 EF 50-55%, inf HK, Gr 1 DD // CMR 4/22: EF 52, no LGE; d/w Dr. Lorretta Harp root 38 mm (mildly dilated)    Renal insufficiency    Sleep apnea    pt doesnt use, states"I cant afford one". PCP aware   Stroke (HCC)    mini-stroke in 2014   TIA (transient ischemic attack)    July, 2013   Tobacco abuse, in remission 06/27/2009   Discontinued in 2009     Wears dentures     top plate   WPW (Wolff-Parkinson-White syndrome)    a. s/p RFCA @ [facility] - 1999    Surgeries: Procedure(s): LEFT BELOW KNEE AMPUTATION on 06/17/2023   Consultants:   Discharged Condition: Improved  Hospital Course: Mike Floyd is an 62 y.o. male who was admitted 06/17/2023 with a chief complaint of No chief complaint on file. , and found to have a diagnosis of Below-knee amputation of left lower extremity (HCC).  They were brought to the operating room on 06/17/2023 and underwent the above named procedures.    They were given perioperative antibiotics:  Anti-infectives (From admission, onward)    Start     Dose/Rate Route Frequency Ordered Stop   06/17/23 2000  ceFAZolin (ANCEF) IVPB 2g/100 mL premix        2 g 200 mL/hr over 30 Minutes Intravenous Every 8 hours 06/17/23 1439 06/18/23 0509   06/17/23 0815  ceFAZolin (ANCEF) IVPB 2g/100 mL premix        2 g 200 mL/hr over 30 Minutes Intravenous On call to O.R. 06/17/23 0801 06/17/23 1223     .  They were given compression stockings, early ambulation, and chemoprophylaxis for DVT prophylaxis.  They benefited maximally  from their hospital stay and there were no complications.    Recent vital signs:  Vitals:   06/20/23 2052 06/21/23 0727  BP: 109/70 102/73  Pulse: 96 98  Resp: 19 18  Temp: 98.2 F (36.8 C) 98.2 F (36.8 C)  SpO2: 98% 100%    Recent laboratory studies:  Results for orders placed or performed during the hospital encounter of 06/17/23  Prealbumin  Result Value Ref Range   Prealbumin 23 18 - 38 mg/dL  Hemoglobin G2X  Result Value Ref Range   Hgb A1c MFr Bld 7.7 (H) 4.8 - 5.6 %   Mean Plasma Glucose 174.29 mg/dL  CBC WITH DIFFERENTIAL  Result Value Ref Range   WBC 5.3 4.0 - 10.5 K/uL   RBC 4.71 4.22 - 5.81 MIL/uL   Hemoglobin 13.9 13.0 - 17.0 g/dL   HCT 52.8 41.3 - 24.4 %   MCV 84.3 80.0 - 100.0 fL   MCH 29.5 26.0 - 34.0 pg   MCHC 35.0 30.0 - 36.0 g/dL   RDW 01.0 27.2 - 53.6 %   Platelets 148  (L) 150 - 400 K/uL   nRBC 0.0 0.0 - 0.2 %   Neutrophils Relative % 68 %   Neutro Abs 3.6 1.7 - 7.7 K/uL   Lymphocytes Relative 21 %   Lymphs Abs 1.1 0.7 - 4.0 K/uL   Monocytes Relative 7 %   Monocytes Absolute 0.4 0.1 - 1.0 K/uL   Eosinophils Relative 2 %   Eosinophils Absolute 0.1 0.0 - 0.5 K/uL   Basophils Relative 2 %   Basophils Absolute 0.1 0.0 - 0.1 K/uL   Immature Granulocytes 0 %   Abs Immature Granulocytes 0.01 0.00 - 0.07 K/uL  Comprehensive metabolic panel  Result Value Ref Range   Sodium 133 (L) 135 - 145 mmol/L   Potassium 3.9 3.5 - 5.1 mmol/L   Chloride 99 98 - 111 mmol/L   CO2 20 (L) 22 - 32 mmol/L   Glucose, Bld 278 (H) 70 - 99 mg/dL   BUN 11 8 - 23 mg/dL   Creatinine, Ser 6.44 0.61 - 1.24 mg/dL   Calcium 8.5 (L) 8.9 - 10.3 mg/dL   Total Protein 6.3 (L) 6.5 - 8.1 g/dL   Albumin 4.0 3.5 - 5.0 g/dL   AST 19 15 - 41 U/L   ALT 19 0 - 44 U/L   Alkaline Phosphatase 107 38 - 126 U/L   Total Bilirubin 2.0 (H) <1.2 mg/dL   GFR, Estimated >03 >47 mL/min   Anion gap 14 5 - 15  Glucose, capillary  Result Value Ref Range   Glucose-Capillary 305 (H) 70 - 99 mg/dL   Comment 1 Notify RN   Glucose, capillary  Result Value Ref Range   Glucose-Capillary 217 (H) 70 - 99 mg/dL  Glucose, capillary  Result Value Ref Range   Glucose-Capillary 170 (H) 70 - 99 mg/dL  Glucose, capillary  Result Value Ref Range   Glucose-Capillary 244 (H) 70 - 99 mg/dL  Glucose, capillary  Result Value Ref Range   Glucose-Capillary 352 (H) 70 - 99 mg/dL  Glucose, capillary  Result Value Ref Range   Glucose-Capillary 291 (H) 70 - 99 mg/dL  Glucose, capillary  Result Value Ref Range   Glucose-Capillary 338 (H) 70 - 99 mg/dL  Glucose, capillary  Result Value Ref Range   Glucose-Capillary 447 (H) 70 - 99 mg/dL  Glucose, random  Result Value Ref Range   Glucose, Bld 406 (H) 70 - 99 mg/dL  Glucose, capillary  Result Value Ref Range   Glucose-Capillary 260 (H) 70 - 99 mg/dL  Glucose,  capillary  Result Value Ref Range   Glucose-Capillary 280 (H) 70 - 99 mg/dL  Glucose, capillary  Result Value Ref Range   Glucose-Capillary 329 (H) 70 - 99 mg/dL   Comment 1 Notify RN    Comment 2 Document in Chart   Glucose, capillary  Result Value Ref Range   Glucose-Capillary 298 (H) 70 - 99 mg/dL   Comment 1 Notify RN    Comment 2 Document in Chart   Glucose, capillary  Result Value Ref Range   Glucose-Capillary 257 (H) 70 - 99 mg/dL   Comment 1 Notify RN    Comment 2 Document in Chart   Glucose, capillary  Result Value Ref Range   Glucose-Capillary 250 (H) 70 - 99 mg/dL  Glucose, capillary  Result Value Ref Range   Glucose-Capillary 296 (H) 70 - 99 mg/dL  Glucose, capillary  Result Value Ref Range   Glucose-Capillary 265 (H) 70 - 99 mg/dL  Glucose, capillary  Result Value Ref Range   Glucose-Capillary 247 (H) 70 - 99 mg/dL  Glucose, capillary  Result Value Ref Range   Glucose-Capillary 225 (H) 70 - 99 mg/dL   Comment 1 Notify RN    Comment 2 Document in Chart   Glucose, capillary  Result Value Ref Range   Glucose-Capillary 276 (H) 70 - 99 mg/dL  Surgical pathology  Result Value Ref Range   SURGICAL PATHOLOGY      SURGICAL PATHOLOGY CASE: [phone] PATIENT: Goldman Whisonant Surgical Pathology Report     Clinical History: osteomyelitis left foot (cm)     FINAL MICROSCOPIC DIAGNOSIS:  A. LEG, LEFT BELOW KNEE, AMPUTATION: -  Skin/soft tissue with gangrenous necrosis.  Directly adjacent underlying bone with bony regeneration, negative for acute osteomyelitis on the section examined. -  Viable soft tissue margin -  Vessels with mild atherosclerosis.    GROSS DESCRIPTION:  Specimen: Received fresh is a clinically left below knee amputation with no digits present, possibly consistent with a previous transmetatarsal amputation Measurements: The leg measures 37.5 cm in length, the foot measures 18 cm in length, the tibia and fibula extend 1 cm from the  soft tissue margin Soft Tissue Margin: Grossly viable Vasculature: The anterior and posterior tibial arteries are identified and followed revealing scant amounts of atherosclerosis. Lesions: There is a 1.5 cm area of gray  ulceration at the distalmost foot, at the presumed amputation site.  Sectioning underlying bone reveals minimal softening. Representative sections are submitted as follows: A1 soft tissue margin A2 anterior tibial artery margin A3 mid and distal anterior tibial artery A4 posterior tibial artery margin A5 mid and distal posterior tibial artery A6 ulceration and underlying bone, following decal (KW, 06/17/2023)  Final Diagnosis performed by Orene Desanctis DO.   Electronically signed 06/20/2023 Technical component performed at [employer]. [facility], [address], [place], [place] [address].  Professional component performed at [facility], [address] [place], [place] [address].  Immunohistochemistry Technical component (if applicable) was performed at [facility]. [address], [address], [place], [place] [address].   IMMUNOHISTOCHEMISTRY DISCLAIMER (if applicable): Some of these immunohistochemical stains may have b een developed and the performance characteristics determine by [facility]. Some may not have been cleared or approved by the U.S. Food and Drug Administration. The FDA has determined that such clearance or approval is not necessary. This test is  used for clinical purposes. It should not be regarded as investigational or for research. This laboratory is certified under the Clinical Laboratory Improvement Amendments of 1988 (CLIA-88) as qualified to perform high complexity clinical laboratory testing.  The controls stained appropriately.   IHC stains are performed on formalin fixed, paraffin embedded tissue using a 3,3"diaminobenzidine (DAB) chromogen and Leica Bond Autostainer System. The  staining intensity of the nucleus is score manually and is reported as the percentage of tumor cell nuclei demonstrating specific nuclear staining. The specimens are fixed in 10% Neutral Formalin for at least 6 hours and up to 72hrs. These tests are validated on decalcified  tissue. Results should be interpreted with caution given the possibility of false negative results on decalcified specimens. Antibody Clones are as follows ER-clone 2F, PR-clone 16, Ki67- clone MM1. Some of these immunohistochemical stains may have been developed and the performance characteristics determined by [facility].     Discharge Medications:   Allergies as of 06/21/2023   No Known Allergies      Medication List     STOP taking these medications    oxycodone 30 MG immediate release tablet Commonly known as: ROXICODONE       TAKE these medications    aspirin 81 MG tablet Take 81 mg by mouth daily.   atorvastatin 20 MG tablet Commonly known as: LIPITOR Take 20 mg by mouth in the morning.   carvedilol 12.5 MG tablet Commonly known as: COREG TAKE 1 TABLET BY MOUTH TWICE DAILY.   cyanocobalamin 1000 MCG tablet Commonly known as: VITAMIN B12 Take 1,000 mcg by mouth daily.   doxycycline 100 MG tablet Commonly known as: VIBRA-TABS Take 1 tablet (100 mg total) by mouth 2 (two) times daily.   famotidine 20 MG tablet Commonly known as: PEPCID Take 20 mg by mouth daily.   furosemide 20 MG tablet Commonly known as: Lasix Take 1 tablet (20 mg total) by mouth daily.   gabapentin 600 MG tablet Commonly known as: NEURONTIN Take 600 mg by mouth 3 (three) times daily.   insulin glargine 100 UNIT/ML injection Commonly known as: LANTUS Inject 20 Units into the skin at bedtime.   isosorbide mononitrate 30 MG 24 hr tablet Commonly known as: IMDUR Take 1 tablet (30 mg total) by mouth daily.   nitroGLYCERIN 0.4 MG SL tablet Commonly known as: NITROSTAT Place 1 tablet (0.4 mg  total) under the tongue every 5 (five) minutes as needed for chest pain.   NovoLOG Mix 70/30 FlexPen (70-30) 100 UNIT/ML FlexPen Generic drug: insulin aspart protamine - aspart Inject 52 Units into the skin 2 (two) times daily.   OneTouch Verio test strip Generic drug: glucose blood TESTING 4 TIMES DAILY.   oxyCODONE-acetaminophen 5-325 MG tablet Commonly known as: PERCOCET/ROXICET Take 1 tablet by mouth every 4 (four) hours as needed.   pantoprazole 40 MG tablet Commonly known as: PROTONIX Take 40 mg by mouth daily.   potassium chloride SA 20 MEQ tablet Commonly known as: KLOR-CON M TAKE (1) TABLET BY MOUTH ONCE DAILY.   sucralfate 1 g tablet Commonly known as: Carafate Take 1 tablet (1 g total) by mouth with breakfast, with lunch, and with evening meal for 7 days. What changed: when to take this   tamsulosin 0.4 MG Caps capsule Commonly known as: FLOMAX Take 0.4 mg by mouth daily. am   zolpidem 10 MG tablet Commonly known as: AMBIEN Take 10 mg by mouth at bedtime.  Durable Medical Equipment  (From admission, onward)           Start     Ordered   06/20/23 1303  For home use only DME lightweight manual wheelchair with seat cushion  Once       Comments: Patient suffers from S/P L BKA which impairs their ability to perform daily activities like bathing in the home.  A walker will not resolve  issue with performing activities of daily living. A wheelchair will allow patient to safely perform daily activities. Patient is not able to propel themselves in the home using a standard weight wheelchair due to general weakness. Patient can self propel in the lightweight wheelchair. Length of need 12 months . Accessories: elevating leg rests (ELRs), wheel locks, extensions and anti-tippers.   06/20/23 1304            Diagnostic Studies: MR Foot Left w/o contrast  Result Date: 06/13/2023 CLINICAL DATA:  Soft tissue infection.  Prior amputation. EXAM:  MRI OF THE LEFT HINDFOOT WITHOUT CONTRAST TECHNIQUE: Multiplanar, multisequence MR imaging of the ankle was performed. No intravenous contrast was administered. COMPARISON:  Radiographs 05/30/2023 FINDINGS: TENDONS Peroneal: Unremarkable Posteromedial: Mild distal tibialis posterior tenosynovitis and tendinopathy. Correlate clinically in assessing for tibialis posterior dysfunction. Anterior: Extensor digitorum tenosynovitis, setting of the foot amputation these extensor digitorum tendons are blind-ending. Achilles: Unremarkable Plantar Fascia: Mildly thickened medial band of the plantar fascia potentially with some underlying fibrosis along its inferior margin. LIGAMENTS Lateral: Unremarkable Medial: Unremarkable CARTILAGE Ankle Joint: Unremarkable Subtalar Joints/Sinus Tarsi: Unremarkable Bones: Prior amputation at the mid metatarsal level. Dorsal staple like fusion device between the medial cuneiform and the base of the second metatarsal with irregular articular surfaces and possibly erosive arthropathy. Arthropathy between the first and second and second and third metatarsal bases. There is abnormal edema signal distally in the fourth metatarsal and to a lesser extent in the third metatarsal with overlying ulceration at the amputation site and small locules of gas in the soft tissues. This is compatible with third and fourth metatarsal osteomyelitis with overlying ulceration and cellulitis. Other: No drainable soft tissue abscess. IMPRESSION: 1. Third and fourth metatarsal osteomyelitis with overlying ulceration and cellulitis along the amputation site. No drainable soft tissue abscess. 2. Mild distal tibialis posterior tenosynovitis and tendinopathy. Correlate clinically in assessing for tibialis posterior dysfunction. 3. Extensor digitorum tenosynovitis, setting of the foot amputation these extensor digitorum tendons are blind-ending. 4. Mildly thickened medial band of the plantar fascia potentially with some  underlying fibrosis along its inferior margin. 5. Dorsal staple like fusion fixator between the medial cuneiform and the base of the second metatarsal with irregular articular surfaces and possibly erosive arthropathy. Arthropathy between the first and second and third metatarsal bases. Electronically Signed   By: Gaylyn Rong M.D.   On: 06/13/2023 15:07   XR Foot Complete Left  Result Date: 05/30/2023 Three-view radiographs of the left foot shows destructive changes of the residual third metatarsal with concern for osteomyelitis.   Disposition: Discharge disposition: 01-Home or Self Care       Discharge Instructions     Call MD / Call 911   Complete by: As directed    If you experience chest pain or shortness of breath, CALL 911 and be transported to the hospital emergency room.  If you develope a fever above 101 F, pus (white drainage) or increased drainage or redness at the wound, or calf pain, call your surgeon's office.   Constipation Prevention  Complete by: As directed    Drink plenty of fluids.  Prune juice may be helpful.  You may use a stool softener, such as Colace (over the counter) 100 mg twice a day.  Use MiraLax (over the counter) for constipation as needed.   Diet - low sodium heart healthy   Complete by: As directed    Increase activity slowly as tolerated   Complete by: As directed    Negative Pressure Wound Therapy - Incisional   Complete by: As directed    Attached the wound VAC dressing to a Prevena plus portable wound VAC pump and show patient how to plug this and to keep it charged.  The Praveena pump should be in a white bag in the closet.   Post-operative opioid taper instructions:   Complete by: As directed    POST-OPERATIVE OPIOID TAPER INSTRUCTIONS: It is important to wean off of your opioid medication as soon as possible. If you do not need pain medication after your surgery it is ok to stop day one. Opioids include: Codeine, Hydrocodone(Norco,  Vicodin), Oxycodone(Percocet, oxycontin) and hydromorphone amongst others.  Long term and even short term use of opiods can cause: Increased pain response Dependence Constipation Depression Respiratory depression And more.  Withdrawal symptoms can include Flu like symptoms Nausea, vomiting And more Techniques to manage these symptoms Hydrate well Eat regular healthy meals Stay active Use relaxation techniques(deep breathing, meditating, yoga) Do Not substitute Alcohol to help with tapering If you have been on opioids for less than two weeks and do not have pain than it is ok to stop all together.  Plan to wean off of opioids This plan should start within one week post op of your joint replacement. Maintain the same interval or time between taking each dose and first decrease the dose.  Cut the total daily intake of opioids by one tablet each day Next start to increase the time between doses. The last dose that should be eliminated is the evening dose.           Follow-up Information     Nadara Mustard, MD Follow up in 1 week(s).   Specialty: Orthopedic Surgery Contact information: [address] [place] [address] [phone]         Elfredia Nevins, MD Follow up.   Specialty: Internal Medicine Contact information: [address] [place] [address] [phone]         [facility] Follow up.   Why: Home health services will be provided by [facility] , start of care within 48 hours post discharge Contact information: [address] [place] [address] [phone]                  Signed: Nadara Mustard 06/21/2023, 7:59 AM

## 2023-06-21 NOTE — Progress Notes (Signed)
Patient has been given discharge instructions to include follow up appointment and prescriptions. Patient verbalizes understanding of instructions.

## 2023-06-21 NOTE — Progress Notes (Signed)
Physical Therapy Treatment Patient Details Name: Mike Floyd MRN: [id number] DOB: April 01, 1961 Today's Date: 06/21/2023   History of Present Illness Pt is a 62 y/o male admitted 06/17/23 after annual follow up where transmetatarsal amputation that had cellulitis ulceration and drainage.  He s/p L BKA 06/17/23. PMH: poorly controlled type 2 diabetes, GERD, hypertension, dyslipidemia, CAD, L TMA 05/19/22, and neuropathy.    PT Comments  Pt progressing well towards his physical therapy goals. Session focused on instruction on wheelchair parts/management, wheelchair mobility, amputee education I.e. desensitization techniques, limb guard protector use, and transfer training. Pt requiring min guard assist for stand pivot transfers to and from wheelchair. Propelled w/c limited hallway distance with BUE's. Will benefit from a few sessions of HHPT to ensure home safety, with transition to OPPT after being fitted for prosthetic.     If plan is discharge home, recommend the following: A little help with walking and/or transfers;A little help with bathing/dressing/bathroom;Assistance with cooking/housework;Assist for transportation   Can travel by Doctor, hospital (measurements PT)    Recommendations for Other Services       Precautions / Restrictions Precautions Precautions: Fall Other Brace: limb guard Lt Restrictions Weight Bearing Restrictions: Yes LLE Weight Bearing: Non weight bearing     Mobility  Bed Mobility Overal bed mobility: Modified Independent                  Transfers Overall transfer level: Needs assistance Equipment used: None Transfers: Bed to chair/wheelchair/BSC   Stand pivot transfers: Contact guard assist         General transfer comment: Pivoting from bed to w/c towards right, CGA for safety. Pivoting from w/c to bed towards left    Ambulation/Gait                   Investment banker, corporate mobility: Yes Wheelchair propulsion: Both upper extremities Wheelchair parts: Supervision/cueing Distance: 50 Wheelchair Assistance Details (indicate cue type and reason): Verbal cues for hand/arm positioning and efficiency with pushing   Tilt Bed    Modified Rankin (Stroke Patients Only)       Balance Overall balance assessment: Needs assistance Sitting-balance support: No upper extremity supported, Feet supported Sitting balance-Leahy Scale: Good                                      Cognition Arousal: Alert Behavior During Therapy: WFL for tasks assessed/performed Overall Cognitive Status: Within Functional Limits for tasks assessed                                          Exercises      General Comments        Pertinent Vitals/Pain Pain Assessment Pain Assessment: Faces Faces Pain Scale: Hurts a little bit Pain Location: L residual limb and phantom pain Pain Descriptors / Indicators: Operative site guarding Pain Intervention(s): Monitored during session    Home Living                          Prior Function            PT Goals (current goals  can now be found in the care plan section) Acute Rehab PT Goals Patient Stated Goal: Be able to care for himself and move safely. Potential to Achieve Goals: Good Progress towards PT goals: Progressing toward goals    Frequency    Min 1X/week      PT Plan      Co-evaluation              AM-PAC PT "6 Clicks" Mobility   Outcome Measure  Help needed turning from your back to your side while in a flat bed without using bedrails?: None Help needed moving from lying on your back to sitting on the side of a flat bed without using bedrails?: None Help needed moving to and from a bed to a chair (including a wheelchair)?: A Little Help needed standing up from a chair using your arms (e.g., wheelchair or  bedside chair)?: A Little Help needed to walk in hospital room?: A Little Help needed climbing 3-5 steps with a railing? : A Lot 6 Click Score: 19    End of Session Equipment Utilized During Treatment: Other (comment) (limb guard protector) Activity Tolerance: Patient tolerated treatment well Patient left: in bed;with call bell/phone within reach Nurse Communication: Mobility status PT Visit Diagnosis: Unsteadiness on feet (R26.81);Other abnormalities of gait and mobility (R26.89);Difficulty in walking, not elsewhere classified (R26.2);Pain Pain - Right/Left: Left Pain - part of body: Leg     Time: 9147-8295 PT Time Calculation (min) (ACUTE ONLY): 31 min  Charges:    $Therapeutic Activity: 23-37 mins PT General Charges $$ ACUTE PT VISIT: 1 Visit                     Lillia Pauls, PT, DPT Acute Rehabilitation Services Office [phone]    [name] 06/21/2023, 11:46 AM

## 2023-06-21 NOTE — Progress Notes (Signed)
Patient ID: Mike Floyd, male   DOB: 1961-06-05, 62 y.o.   MRN: [id number] Patient is status post transtibial amputation.  The wound VAC has a good suction fit.  Patient states he is ready for discharge to home.  Will discharge with the Prevena plus portable wound VAC pump and a prescription for Percocet.

## 2023-06-23 ENCOUNTER — Telehealth: Payer: Self-pay

## 2023-06-23 ENCOUNTER — Telehealth: Payer: Self-pay | Admitting: Family

## 2023-06-23 DIAGNOSIS — I2510 Atherosclerotic heart disease of native coronary artery without angina pectoris: Secondary | ICD-10-CM | POA: Diagnosis not present

## 2023-06-23 DIAGNOSIS — M86172 Other acute osteomyelitis, left ankle and foot: Secondary | ICD-10-CM | POA: Diagnosis not present

## 2023-06-23 DIAGNOSIS — N182 Chronic kidney disease, stage 2 (mild): Secondary | ICD-10-CM | POA: Diagnosis not present

## 2023-06-23 DIAGNOSIS — T8781 Dehiscence of amputation stump: Secondary | ICD-10-CM | POA: Diagnosis not present

## 2023-06-23 DIAGNOSIS — I130 Hypertensive heart and chronic kidney disease with heart failure and stage 1 through stage 4 chronic kidney disease, or unspecified chronic kidney disease: Secondary | ICD-10-CM | POA: Diagnosis not present

## 2023-06-23 DIAGNOSIS — G4730 Sleep apnea, unspecified: Secondary | ICD-10-CM | POA: Diagnosis not present

## 2023-06-23 DIAGNOSIS — I428 Other cardiomyopathies: Secondary | ICD-10-CM | POA: Diagnosis not present

## 2023-06-23 DIAGNOSIS — E1140 Type 2 diabetes mellitus with diabetic neuropathy, unspecified: Secondary | ICD-10-CM | POA: Diagnosis not present

## 2023-06-23 DIAGNOSIS — E1122 Type 2 diabetes mellitus with diabetic chronic kidney disease: Secondary | ICD-10-CM | POA: Diagnosis not present

## 2023-06-23 DIAGNOSIS — I5020 Unspecified systolic (congestive) heart failure: Secondary | ICD-10-CM | POA: Diagnosis not present

## 2023-06-23 NOTE — Telephone Encounter (Signed)
Charri from [place] [facility] called and wants to know if you can see him tomorrow also for his unlcers in the right foot.  Also need orders for PT 1 wk1, 3wk1., 2wk3. And the OT Evaluation. And request for a drop down bedside toilet. CB#[phone]

## 2023-06-23 NOTE — Telephone Encounter (Signed)
[facility] called and states that the pt has fallen x 2 with direct impact to the limb without wearing limb protector since hospital d/c on 06/21/2023. Vac is alarming and shows that there is not a good seal. Offered appt to come in today and pt is not able to work out transportation. Advised [facility] that she could remove the vac and take photos and they did not want to do that. Offered an appt tomorrow morning with Erin at 10:30 they will come in then and call if they have any questions. Will hold this message until pt arrives for appt. T 99.1 BP 100/60, R 16, P 108 and pain 6 out on 10 taking Oxycodone for pain relief.

## 2023-06-24 ENCOUNTER — Telehealth: Payer: Self-pay

## 2023-06-24 ENCOUNTER — Encounter: Payer: Self-pay | Admitting: Family

## 2023-06-24 ENCOUNTER — Ambulatory Visit (INDEPENDENT_AMBULATORY_CARE_PROVIDER_SITE_OTHER): Payer: Medicare Other | Admitting: Family

## 2023-06-24 DIAGNOSIS — Z89512 Acquired absence of left leg below knee: Secondary | ICD-10-CM

## 2023-06-24 DIAGNOSIS — S88112A Complete traumatic amputation at level between knee and ankle, left lower leg, initial encounter: Secondary | ICD-10-CM

## 2023-06-24 NOTE — Telephone Encounter (Signed)
[facility] called states she spoke to patient and states he fell again. States he came in to see Denny Peon this morning. He is in extreme pain after fall today. Daughter would like for him to go to a nursing home but he refuses to go.  Would like for you to contact patient.   PT CB [phone] CB: [phone]

## 2023-06-24 NOTE — Progress Notes (Signed)
Post-Op Visit Note   Patient: Mike Floyd           Date of Birth: 08-05-1961           MRN: [id number] Visit Date: 06/24/2023 PCP: Elfredia Nevins, MD  Chief Complaint:  Chief Complaint  Patient presents with   Left Leg - Routine Post Op    06/17/2023 left BKA     HPI:  HPI The patient is a 62 year old gentleman seen status post left below-knee amputation.  Wound VAC removed today.  Reports home health has called and scheduled they will Be coming out for physical therapy and incisional checks Ortho Exam On examination left residual limb his incision is well-approximated with sutures there is scant bloody drainage there is no surrounding erythema no sign of infection  Visit Diagnoses: No diagnosis found.  Plan: Begin daily dose of cleansing.  Dry dressings.  Continue limb protector and shrinker.  Will reach out to social work as well as home health.  Renee a check of his safety concerning his living conditions.  Follow-Up Instructions: No follow-ups on file.   Imaging: No results found.  Orders:  No orders of the defined types were placed in this encounter.  No orders of the defined types were placed in this encounter.    PMFS History: Patient Active Problem List   Diagnosis Date Noted   Below-knee amputation of left lower extremity (HCC) 06/17/2023   Acute osteomyelitis of metatarsal bone of left foot (HCC)    Subacute osteomyelitis, left ankle and foot (HCC) 05/13/2022   Non-pressure chronic ulcer of other part of right foot limited to breakdown of skin (HCC)    Contracture of right Achilles tendon    Mildly dilatd aortic root (HCC)    Contracture of left Achilles tendon    Metatarsal deformity, left    CAD (coronary artery disease)    Hallux hammertoe, left 12/11/2019   Diarrhea 06/27/2019   Diastasis recti 08/22/2018   Umbilical hernia without obstruction and without gangrene 08/22/2018   Vitamin D deficiency 06/23/2017   Essential hypertension,  benign 06/15/2017   Mixed hyperlipidemia 06/15/2017   Class 1 obesity due to excess calories with serious comorbidity and body mass index (BMI) of 31.0 to 31.9 in adult 06/15/2017   Malfunction of implantable cardioverter-defibrillator (ICD) electrode 10/07/2016   Chronic kidney disease (CKD), stage III (moderate) (HCC) 04/04/2015   Bladder neck obstruction 03/21/2015   Difficult or painful urination 03/21/2015   Flank pain 03/21/2015   Delayed onset of urination 03/21/2015   Calculus of kidney 03/21/2015   Erosive esophagitis 12/27/2013   Hypotension due to drugs 07/02/2013   Chronic systolic heart failure (HCC) 04/24/2013   CAP (community acquired pneumonia) 03/16/2013   HTN (hypertension) 03/15/2013   Hydrocele 03/15/2013   Spermatocele 03/15/2013   DOE (dyspnea on exertion) 03/15/2013   OSA (obstructive sleep apnea) 01/09/2013   Chronic kidney disease, stage 3 (HCC) 01/03/2013   Chest pain 12/26/2012   History of diagnostic tests 09/06/2012   TIA (transient ischemic attack)    Cardiomyopathy, nonischemic (HCC) 02/24/2010   AICD (automatic cardioverter/defibrillator) 02/24/2010   DM type 2 causing vascular disease (HCC) 06/27/2009   Gout 06/27/2009   Tobacco abuse, in remission 06/27/2009   COLONIC POLYPS 10/31/2008   GERD (gastroesophageal reflux disease) 10/31/2008   Past Medical History:  Diagnosis Date   AICD (automatic cardioverter/defibrillator) present 11/2007   Removed in 2018, a. 11/2007 SJM Current VR - single lead ICD  - Removed 2018 - "  it was burning me"   Anxiety    CAD (coronary artery disease)    non-obstructive CAD by Cor CT in 2020 // Myoview 3/22: EF 57, small inf-sept defect c/w scar, no ischemia; low risk      Chest pain    a. 10/2007 Cath:  normal Cors.   CKD (chronic kidney disease), stage II    DDD (degenerative disc disease), lumbar    Diabetes mellitus DX: 2010   type 2   Erosive esophagitis    a. per EGD (08/2011), Dr. Karilyn Cota - Erosive reflux  esophagitis improved but not completely healed since previous EGD 3 years ago. Bx showing  ulcerated gatroesophageal junction mucosa. negative for H. pylori   GERD (gastroesophageal reflux disease)    Gout    Hearing deficit    a. wear bilateral hearing aides   History of hiatal hernia    History of kidney stones    passed stones, no surgery   Hypertension    Mildly dilatd aortic root (HCC)    CMR 4/22: EF 52, no LGE; d/w Dr. Lorretta Harp root 38 mm (mildly dilated)   Myocardial infarction [facility]) 2011   Neuropathy    Feet and legs   Nonischemic dilated cardiomyopathy (HCC)    a. H/O EF as low as 35-40% by LV gram 10/2007;  b. Echo 02/2011 EF 50-55%, inf HK, Gr 1 DD // CMR 4/22: EF 52, no LGE; d/w Dr. Lorretta Harp root 38 mm (mildly dilated)    Renal insufficiency    Sleep apnea    pt doesnt use, states"I cant afford one". PCP aware   Stroke (HCC)    mini-stroke in 2014   TIA (transient ischemic attack)    July, 2013   Tobacco abuse, in remission 06/27/2009   Discontinued in 2009     Wears dentures    top plate   WPW (Wolff-Parkinson-White syndrome)    a. s/p RFCA @ [facility] - 1999    Family History  Problem Relation Age of Onset   Heart attack Mother 26   Hypertension Mother    Diabetes Mother    Kidney disease Mother    Breast cancer Mother    Heart attack Father 54   Hypertension Father    Diabetes Father    Stroke Brother    Heart attack Brother 61   Stroke Maternal Grandmother 24   Diabetes Brother    Hypertension Brother     Past Surgical History:  Procedure Laterality Date   AMPUTATION Left 05/19/2022   Procedure: TRANSMETATARSAL AMPUTATION LEFT FOOT;  Surgeon: Nadara Mustard, MD;  Location: [facility] OR;  Service: Orthopedics;  Laterality: Left;   AMPUTATION Left 06/17/2023   Procedure: LEFT BELOW KNEE AMPUTATION;  Surgeon: Nadara Mustard, MD;  Location[facility] OR;  Service: Orthopedics;  Laterality: Left;   APPENDECTOMY     BACK SURGERY     1995   CARDIAC CATHETERIZATION      2009   CARDIAC DEFIBRILLATOR PLACEMENT     ICD was removed in 2018   CHOLECYSTECTOMY     CHOLECYSTECTOMY, LAPAROSCOPIC     11/2007   COLONOSCOPY W/ POLYPECTOMY  2009   ELBOW SURGERY Left 06/2010   ESOPHAGEAL DILATION N/A 11/08/2014   Procedure: ESOPHAGEAL DILATION;  Surgeon: Malissa Hippo, MD;  Location: [facility] ORS;  Service: Endoscopy;  Laterality: N/A;  #56,    ESOPHAGOGASTRODUODENOSCOPY  03/31/2012   also 08/2011; [name]   ESOPHAGOGASTRODUODENOSCOPY (EGD) WITH PROPOFOL N/A 11/08/2014   Procedure: ESOPHAGOGASTRODUODENOSCOPY (EGD)  WITH PROPOFOL;  Surgeon: Malissa Hippo, MD;  Location: [facility] ORS;  Service: Endoscopy;  Laterality: N/A;  Hiatus is 30 , GE Junction is 37   FOOT ARTHRODESIS Left 10/24/2020   Procedure: LEFT GASTROCNEMIUS RECESSION, DORSIFLEXION OSTEOTOMY 1ST MT;  Surgeon: Nadara Mustard, MD;  Location: [facility] OR;  Service: Orthopedics;  Laterality: Left;   FOOT ARTHRODESIS Right 01/09/2021   Procedure: CLOSING WEDGE OSTEOTOMY RIGHT 1ST METATARSAL;  Surgeon: Nadara Mustard, MD;  Location: [facility] OR;  Service: Orthopedics;  Laterality: Right;   GASTROCNEMIUS RECESSION Right 01/09/2021   Procedure: RIGHT GASTROCNEMIUS RECESSION;  Surgeon: Nadara Mustard, MD;  Location: [facility] OR;  Service: Orthopedics;  Laterality: Right;   ICD LEAD REMOVAL N/A 10/07/2016   Procedure: ICD LEAD REMOVAL ;  Surgeon: Marinus Maw, MD;  Location[facility] OR;  Service: Cardiovascular;  Laterality: N/A;   MULTIPLE TOOTH EXTRACTIONS     RADIOFREQUENCY ABLATION  1999   WPW; performed at [facility]   TEE WITHOUT CARDIOVERSION N/A 10/07/2016   Procedure: TRANSESOPHAGEAL ECHOCARDIOGRAM (TEE);  Surgeon: Marinus Maw, MD;  Location: [facility] OR;  Service: Cardiovascular;  Laterality: N/A;   Social History   Occupational History   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    Comment: Laid off in 11/11  Tobacco Use   Smoking status: Former    Current packs/day: 0.00    Average packs/day: 1.5 packs/day for  39.1 years (58.6 ttl pk-yrs)    Types: Cigarettes    Start date: 06/30/1971    Quit date: 07/30/2010    Years since quitting: 12.9   Smokeless tobacco: Former    Types: Chew    Quit date: 03/26/1994  Vaping Use   Vaping status: Former   Start date: 07/30/2010   Quit date: 06/29/2016  Substance and Sexual Activity   Alcohol use: No    Alcohol/week: 1.0 standard drink of alcohol    Types: 1 Cans of beer per week    Comment: used "years ago"   Drug use: No   Sexual activity: Yes    Birth control/protection: None

## 2023-06-24 NOTE — Telephone Encounter (Signed)
Contact with Iantha Fallen was contacted after pt appt to advise per the daughter of the living situations ( per daughter hoarding like living and difficulty leaving the home) sw the pt and he states that he did stumble today getting inside the house but did not fall on amputation and that the [facility] is supposed to call to make appt to come out and therapy will start next week. A referral for a social worker was made as well to check for community based programs that the pt can benefit from ie: meals on wheels. Will also discuss with management about the daughter concerns for living and the fear that she has for infection, falls and unsafe living arrangements.

## 2023-06-27 ENCOUNTER — Telehealth: Payer: Self-pay | Admitting: Orthopedic Surgery

## 2023-06-27 NOTE — Telephone Encounter (Signed)
Email sent to [facility] to discuss pt and is social work has been out to see the pt or if they are going this week.

## 2023-06-27 NOTE — Telephone Encounter (Signed)
Order for drop arm commode placed through [facility] [employer] on parachute.

## 2023-06-27 NOTE — Telephone Encounter (Signed)
Mike Floyd states that social work has been in touch with the family and the pt and is actively working on solutions to problems listed below.

## 2023-06-27 NOTE — Telephone Encounter (Signed)
Charri called back to verify if the orders was approved for his Physical Therapy and OT eval. [phone]

## 2023-06-28 DIAGNOSIS — M86172 Other acute osteomyelitis, left ankle and foot: Secondary | ICD-10-CM | POA: Diagnosis not present

## 2023-06-28 DIAGNOSIS — E1140 Type 2 diabetes mellitus with diabetic neuropathy, unspecified: Secondary | ICD-10-CM | POA: Diagnosis not present

## 2023-06-28 DIAGNOSIS — E1122 Type 2 diabetes mellitus with diabetic chronic kidney disease: Secondary | ICD-10-CM | POA: Diagnosis not present

## 2023-06-28 DIAGNOSIS — I130 Hypertensive heart and chronic kidney disease with heart failure and stage 1 through stage 4 chronic kidney disease, or unspecified chronic kidney disease: Secondary | ICD-10-CM | POA: Diagnosis not present

## 2023-06-28 DIAGNOSIS — T8781 Dehiscence of amputation stump: Secondary | ICD-10-CM | POA: Diagnosis not present

## 2023-06-28 DIAGNOSIS — G4730 Sleep apnea, unspecified: Secondary | ICD-10-CM | POA: Diagnosis not present

## 2023-06-28 DIAGNOSIS — I5020 Unspecified systolic (congestive) heart failure: Secondary | ICD-10-CM | POA: Diagnosis not present

## 2023-06-28 DIAGNOSIS — I2510 Atherosclerotic heart disease of native coronary artery without angina pectoris: Secondary | ICD-10-CM | POA: Diagnosis not present

## 2023-06-28 DIAGNOSIS — N182 Chronic kidney disease, stage 2 (mild): Secondary | ICD-10-CM | POA: Diagnosis not present

## 2023-06-28 DIAGNOSIS — I428 Other cardiomyopathies: Secondary | ICD-10-CM | POA: Diagnosis not present

## 2023-06-28 NOTE — Telephone Encounter (Signed)
Given VO on PT and OT eval.

## 2023-06-29 DIAGNOSIS — T8781 Dehiscence of amputation stump: Secondary | ICD-10-CM | POA: Diagnosis not present

## 2023-06-29 DIAGNOSIS — I130 Hypertensive heart and chronic kidney disease with heart failure and stage 1 through stage 4 chronic kidney disease, or unspecified chronic kidney disease: Secondary | ICD-10-CM | POA: Diagnosis not present

## 2023-06-29 DIAGNOSIS — I428 Other cardiomyopathies: Secondary | ICD-10-CM | POA: Diagnosis not present

## 2023-06-29 DIAGNOSIS — G4730 Sleep apnea, unspecified: Secondary | ICD-10-CM | POA: Diagnosis not present

## 2023-06-29 DIAGNOSIS — E1122 Type 2 diabetes mellitus with diabetic chronic kidney disease: Secondary | ICD-10-CM | POA: Diagnosis not present

## 2023-06-29 DIAGNOSIS — I5020 Unspecified systolic (congestive) heart failure: Secondary | ICD-10-CM | POA: Diagnosis not present

## 2023-06-29 DIAGNOSIS — N182 Chronic kidney disease, stage 2 (mild): Secondary | ICD-10-CM | POA: Diagnosis not present

## 2023-06-29 DIAGNOSIS — I2510 Atherosclerotic heart disease of native coronary artery without angina pectoris: Secondary | ICD-10-CM | POA: Diagnosis not present

## 2023-06-29 DIAGNOSIS — M86172 Other acute osteomyelitis, left ankle and foot: Secondary | ICD-10-CM | POA: Diagnosis not present

## 2023-06-29 DIAGNOSIS — E1140 Type 2 diabetes mellitus with diabetic neuropathy, unspecified: Secondary | ICD-10-CM | POA: Diagnosis not present

## 2023-06-30 ENCOUNTER — Encounter: Payer: Medicare Other | Admitting: Orthopedic Surgery

## 2023-06-30 DIAGNOSIS — I428 Other cardiomyopathies: Secondary | ICD-10-CM | POA: Diagnosis not present

## 2023-06-30 DIAGNOSIS — I130 Hypertensive heart and chronic kidney disease with heart failure and stage 1 through stage 4 chronic kidney disease, or unspecified chronic kidney disease: Secondary | ICD-10-CM | POA: Diagnosis not present

## 2023-06-30 DIAGNOSIS — I2510 Atherosclerotic heart disease of native coronary artery without angina pectoris: Secondary | ICD-10-CM | POA: Diagnosis not present

## 2023-06-30 DIAGNOSIS — N182 Chronic kidney disease, stage 2 (mild): Secondary | ICD-10-CM | POA: Diagnosis not present

## 2023-06-30 DIAGNOSIS — M86172 Other acute osteomyelitis, left ankle and foot: Secondary | ICD-10-CM | POA: Diagnosis not present

## 2023-06-30 DIAGNOSIS — G4730 Sleep apnea, unspecified: Secondary | ICD-10-CM | POA: Diagnosis not present

## 2023-06-30 DIAGNOSIS — E1122 Type 2 diabetes mellitus with diabetic chronic kidney disease: Secondary | ICD-10-CM | POA: Diagnosis not present

## 2023-06-30 DIAGNOSIS — E1140 Type 2 diabetes mellitus with diabetic neuropathy, unspecified: Secondary | ICD-10-CM | POA: Diagnosis not present

## 2023-06-30 DIAGNOSIS — I5020 Unspecified systolic (congestive) heart failure: Secondary | ICD-10-CM | POA: Diagnosis not present

## 2023-06-30 DIAGNOSIS — T8781 Dehiscence of amputation stump: Secondary | ICD-10-CM | POA: Diagnosis not present

## 2023-07-01 DIAGNOSIS — I428 Other cardiomyopathies: Secondary | ICD-10-CM | POA: Diagnosis not present

## 2023-07-01 DIAGNOSIS — G4730 Sleep apnea, unspecified: Secondary | ICD-10-CM | POA: Diagnosis not present

## 2023-07-01 DIAGNOSIS — I2510 Atherosclerotic heart disease of native coronary artery without angina pectoris: Secondary | ICD-10-CM | POA: Diagnosis not present

## 2023-07-01 DIAGNOSIS — E1140 Type 2 diabetes mellitus with diabetic neuropathy, unspecified: Secondary | ICD-10-CM | POA: Diagnosis not present

## 2023-07-01 DIAGNOSIS — M86172 Other acute osteomyelitis, left ankle and foot: Secondary | ICD-10-CM | POA: Diagnosis not present

## 2023-07-01 DIAGNOSIS — N182 Chronic kidney disease, stage 2 (mild): Secondary | ICD-10-CM | POA: Diagnosis not present

## 2023-07-01 DIAGNOSIS — I5020 Unspecified systolic (congestive) heart failure: Secondary | ICD-10-CM | POA: Diagnosis not present

## 2023-07-01 DIAGNOSIS — T8781 Dehiscence of amputation stump: Secondary | ICD-10-CM | POA: Diagnosis not present

## 2023-07-01 DIAGNOSIS — I130 Hypertensive heart and chronic kidney disease with heart failure and stage 1 through stage 4 chronic kidney disease, or unspecified chronic kidney disease: Secondary | ICD-10-CM | POA: Diagnosis not present

## 2023-07-01 DIAGNOSIS — E1122 Type 2 diabetes mellitus with diabetic chronic kidney disease: Secondary | ICD-10-CM | POA: Diagnosis not present

## 2023-07-03 ENCOUNTER — Other Ambulatory Visit: Payer: Self-pay | Admitting: Cardiovascular Disease

## 2023-07-04 DIAGNOSIS — M86172 Other acute osteomyelitis, left ankle and foot: Secondary | ICD-10-CM | POA: Diagnosis not present

## 2023-07-04 DIAGNOSIS — I428 Other cardiomyopathies: Secondary | ICD-10-CM | POA: Diagnosis not present

## 2023-07-04 DIAGNOSIS — I130 Hypertensive heart and chronic kidney disease with heart failure and stage 1 through stage 4 chronic kidney disease, or unspecified chronic kidney disease: Secondary | ICD-10-CM | POA: Diagnosis not present

## 2023-07-04 DIAGNOSIS — I2510 Atherosclerotic heart disease of native coronary artery without angina pectoris: Secondary | ICD-10-CM | POA: Diagnosis not present

## 2023-07-04 DIAGNOSIS — G4730 Sleep apnea, unspecified: Secondary | ICD-10-CM | POA: Diagnosis not present

## 2023-07-04 DIAGNOSIS — N182 Chronic kidney disease, stage 2 (mild): Secondary | ICD-10-CM | POA: Diagnosis not present

## 2023-07-04 DIAGNOSIS — E1122 Type 2 diabetes mellitus with diabetic chronic kidney disease: Secondary | ICD-10-CM | POA: Diagnosis not present

## 2023-07-04 DIAGNOSIS — I5020 Unspecified systolic (congestive) heart failure: Secondary | ICD-10-CM | POA: Diagnosis not present

## 2023-07-04 DIAGNOSIS — T8781 Dehiscence of amputation stump: Secondary | ICD-10-CM | POA: Diagnosis not present

## 2023-07-04 DIAGNOSIS — E1140 Type 2 diabetes mellitus with diabetic neuropathy, unspecified: Secondary | ICD-10-CM | POA: Diagnosis not present

## 2023-07-05 ENCOUNTER — Ambulatory Visit (INDEPENDENT_AMBULATORY_CARE_PROVIDER_SITE_OTHER): Payer: Medicare Other | Admitting: Orthopedic Surgery

## 2023-07-05 DIAGNOSIS — S88112A Complete traumatic amputation at level between knee and ankle, left lower leg, initial encounter: Secondary | ICD-10-CM

## 2023-07-05 DIAGNOSIS — Z89512 Acquired absence of left leg below knee: Secondary | ICD-10-CM

## 2023-07-08 DIAGNOSIS — T8781 Dehiscence of amputation stump: Secondary | ICD-10-CM | POA: Diagnosis not present

## 2023-07-08 DIAGNOSIS — I130 Hypertensive heart and chronic kidney disease with heart failure and stage 1 through stage 4 chronic kidney disease, or unspecified chronic kidney disease: Secondary | ICD-10-CM | POA: Diagnosis not present

## 2023-07-08 DIAGNOSIS — I5020 Unspecified systolic (congestive) heart failure: Secondary | ICD-10-CM | POA: Diagnosis not present

## 2023-07-08 DIAGNOSIS — M86172 Other acute osteomyelitis, left ankle and foot: Secondary | ICD-10-CM | POA: Diagnosis not present

## 2023-07-08 DIAGNOSIS — I428 Other cardiomyopathies: Secondary | ICD-10-CM | POA: Diagnosis not present

## 2023-07-08 DIAGNOSIS — E1140 Type 2 diabetes mellitus with diabetic neuropathy, unspecified: Secondary | ICD-10-CM | POA: Diagnosis not present

## 2023-07-08 DIAGNOSIS — E1122 Type 2 diabetes mellitus with diabetic chronic kidney disease: Secondary | ICD-10-CM | POA: Diagnosis not present

## 2023-07-08 DIAGNOSIS — N182 Chronic kidney disease, stage 2 (mild): Secondary | ICD-10-CM | POA: Diagnosis not present

## 2023-07-08 DIAGNOSIS — G4730 Sleep apnea, unspecified: Secondary | ICD-10-CM | POA: Diagnosis not present

## 2023-07-08 DIAGNOSIS — I2510 Atherosclerotic heart disease of native coronary artery without angina pectoris: Secondary | ICD-10-CM | POA: Diagnosis not present

## 2023-07-11 DIAGNOSIS — I5020 Unspecified systolic (congestive) heart failure: Secondary | ICD-10-CM | POA: Diagnosis not present

## 2023-07-11 DIAGNOSIS — E1122 Type 2 diabetes mellitus with diabetic chronic kidney disease: Secondary | ICD-10-CM | POA: Diagnosis not present

## 2023-07-11 DIAGNOSIS — M86172 Other acute osteomyelitis, left ankle and foot: Secondary | ICD-10-CM | POA: Diagnosis not present

## 2023-07-11 DIAGNOSIS — E1140 Type 2 diabetes mellitus with diabetic neuropathy, unspecified: Secondary | ICD-10-CM | POA: Diagnosis not present

## 2023-07-11 DIAGNOSIS — T8781 Dehiscence of amputation stump: Secondary | ICD-10-CM | POA: Diagnosis not present

## 2023-07-11 DIAGNOSIS — G4730 Sleep apnea, unspecified: Secondary | ICD-10-CM | POA: Diagnosis not present

## 2023-07-11 DIAGNOSIS — I130 Hypertensive heart and chronic kidney disease with heart failure and stage 1 through stage 4 chronic kidney disease, or unspecified chronic kidney disease: Secondary | ICD-10-CM | POA: Diagnosis not present

## 2023-07-11 DIAGNOSIS — N182 Chronic kidney disease, stage 2 (mild): Secondary | ICD-10-CM | POA: Diagnosis not present

## 2023-07-11 DIAGNOSIS — I2510 Atherosclerotic heart disease of native coronary artery without angina pectoris: Secondary | ICD-10-CM | POA: Diagnosis not present

## 2023-07-11 DIAGNOSIS — I428 Other cardiomyopathies: Secondary | ICD-10-CM | POA: Diagnosis not present

## 2023-07-12 ENCOUNTER — Ambulatory Visit (HOSPITAL_COMMUNITY)
Admission: RE | Admit: 2023-07-12 | Discharge: 2023-07-12 | Disposition: A | Discharge status: Home / Self Care | Payer: Medicare Other | Source: Ambulatory Visit | Attending: Student | Admitting: Student

## 2023-07-12 ENCOUNTER — Encounter: Payer: Self-pay | Admitting: Orthopedic Surgery

## 2023-07-12 NOTE — Progress Notes (Signed)
Office Visit Note   Patient: Mike Floyd           Date of Birth: 1961-05-06           MRN: [id number] Visit Date: 07/05/2023              Requested by: Elfredia Nevins, MD [address] [place],  [place] [address] PCP: Elfredia Nevins, MD  Chief Complaint  Patient presents with   Left Leg - Routine Post Op    06/17/2023 left BKA       HPI: Patient is a 62 year old gentleman who is seen 2 weeks status post left below-knee amputation.  He is currently cleansing with Dial soap using a limb protector and shrinker.  Assessment & Plan: Visit Diagnoses:  1. Below-knee amputation of left lower extremity, initial encounter [facility])     Plan: Continue with Dial soap cleansing 4 x 4 and Ace wrap covered with a shrinker.  Prescription provided for crutches.  Plan to harvest staples at follow-up.  Follow-Up Instructions: Return in about 2 weeks (around 07/19/2023).   Ortho Exam  Patient is alert, oriented, no adenopathy, well-dressed, normal affect, normal respiratory effort. Examination the wound edges are well consolidated.  There is a small resolving draining hematoma medially.  There is no cellulitis no wound dehiscence.  Imaging: No results found.   Labs: Lab Results  Component Value Date   HGBA1C 7.7 (H) 06/17/2023   HGBA1C 9.7 (H) 05/13/2022   HGBA1C 7.0 (H) 06/15/2017   ESRSEDRATE 2 03/14/2023   CRP <1 03/14/2023   CRP 0.6 05/16/2022   CRP 2.2 06/27/2019   LABURIC 5.5 03/07/2012   REPTSTATUS 05/18/2022 FINAL 05/13/2022   CULT  05/13/2022    NO GROWTH 5 DAYS Performed at [facility], [address] [place], [place] [address]      Lab Results  Component Value Date   ALBUMIN 4.0 06/17/2023   ALBUMIN 4.4 05/04/2023   ALBUMIN 4.1 02/26/2023   PREALBUMIN 23 06/17/2023    Lab Results  Component Value Date   MG 1.8 02/26/2023   MG 1.7 05/20/2022   MG 2.0 05/19/2022   Lab Results  Component Value Date   VD25OH 18 (L) 06/15/2017    Lab Results   Component Value Date   PREALBUMIN 23 06/17/2023      Latest Ref Rng & Units 06/17/2023    8:40 AM 05/04/2023    9:47 AM 02/26/2023   11:08 AM  CBC EXTENDED  WBC 4.0 - 10.5 K/uL 5.3  6.1    RBC 4.22 - 5.81 MIL/uL 4.71  4.86    Hemoglobin 13.0 - 17.0 g/dL 66.4  40.3  47.4   HCT 39.0 - 52.0 % 39.7  41.3  41.0   Platelets 150 - 400 K/uL 148  162    NEUT# 1.7 - 7.7 K/uL 3.6  4.5    Lymph# 0.7 - 4.0 K/uL 1.1  1.0       There is no height or weight on file to calculate BMI.  Orders:  No orders of the defined types were placed in this encounter.  No orders of the defined types were placed in this encounter.    Procedures: No procedures performed  Clinical Data: No additional findings.  ROS:  All other systems negative, except as noted in the HPI. Review of Systems  Objective: Vital Signs: There were no vitals taken for this visit.  Specialty Comments:  No specialty comments available.  PMFS History: Patient Active Problem  List   Diagnosis Date Noted   Below-knee amputation of left lower extremity (HCC) 06/17/2023   Acute osteomyelitis of metatarsal bone of left foot (HCC)    Subacute osteomyelitis, left ankle and foot (HCC) 05/13/2022   Non-pressure chronic ulcer of other part of right foot limited to breakdown of skin (HCC)    Contracture of right Achilles tendon    Mildly dilatd aortic root (HCC)    Contracture of left Achilles tendon    Metatarsal deformity, left    CAD (coronary artery disease)    Hallux hammertoe, left 12/11/2019   Diarrhea 06/27/2019   Diastasis recti 08/22/2018   Umbilical hernia without obstruction and without gangrene 08/22/2018   Vitamin D deficiency 06/23/2017   Essential hypertension, benign 06/15/2017   Mixed hyperlipidemia 06/15/2017   Class 1 obesity due to excess calories with serious comorbidity and body mass index (BMI) of 31.0 to 31.9 in adult 06/15/2017   Malfunction of implantable cardioverter-defibrillator (ICD) electrode  10/07/2016   Chronic kidney disease (CKD), stage III (moderate) (HCC) 04/04/2015   Bladder neck obstruction 03/21/2015   Difficult or painful urination 03/21/2015   Flank pain 03/21/2015   Delayed onset of urination 03/21/2015   Calculus of kidney 03/21/2015   Erosive esophagitis 12/27/2013   Hypotension due to drugs 07/02/2013   Chronic systolic heart failure (HCC) 04/24/2013   CAP (community acquired pneumonia) 03/16/2013   HTN (hypertension) 03/15/2013   Hydrocele 03/15/2013   Spermatocele 03/15/2013   DOE (dyspnea on exertion) 03/15/2013   OSA (obstructive sleep apnea) 01/09/2013   Chronic kidney disease, stage 3 (HCC) 01/03/2013   Chest pain 12/26/2012   History of diagnostic tests 09/06/2012   TIA (transient ischemic attack)    Cardiomyopathy, nonischemic (HCC) 02/24/2010   AICD (automatic cardioverter/defibrillator) 02/24/2010   DM type 2 causing vascular disease (HCC) 06/27/2009   Gout 06/27/2009   Tobacco abuse, in remission 06/27/2009   COLONIC POLYPS 10/31/2008   GERD (gastroesophageal reflux disease) 10/31/2008   Past Medical History:  Diagnosis Date   AICD (automatic cardioverter/defibrillator) present 11/2007   Removed in 2018, a. 11/2007 S[facility] Current VR - single lead ICD  - Removed 2018 - "it was burning me"   Anxiety    CAD (coronary artery disease)    non-obstructive CAD by Cor CT in 2020 // Myoview 3/22: EF 57, small inf-sept defect c/w scar, no ischemia; low risk      Chest pain    a. 10/2007 Cath:  normal Cors.   CKD (chronic kidney disease), stage II    DDD (degenerative disc disease), lumbar    Diabetes mellitus DX: 2010   type 2   Erosive esophagitis    a. per EGD (08/2011), Dr. Karilyn Cota - Erosive reflux esophagitis improved but not completely healed since previous EGD 3 years ago. Bx showing  ulcerated gatroesophageal junction mucosa. negative for H. pylori   GERD (gastroesophageal reflux disease)    Gout    Hearing deficit    a. wear bilateral hearing  aides   History of hiatal hernia    History of kidney stones    passed stones, no surgery   Hypertension    Mildly dilatd aortic root (HCC)    CMR 4/22: EF 52, no LGE; d/w Dr. Lorretta Harp root 38 mm (mildly dilated)   Myocardial infarction [facility]) 2011   Neuropathy    Feet and legs   Nonischemic dilated cardiomyopathy (HCC)    a. H/O EF as low as 35-40% by LV gram 10/2007;  b. Echo 02/2011 EF 50-55%, inf HK, Gr 1 DD // CMR 4/22: EF 52, no LGE; d/w Dr. Lorretta Harp root 38 mm (mildly dilated)    Renal insufficiency    Sleep apnea    pt doesnt use, states"I cant afford one". PCP aware   Stroke (HCC)    mini-stroke in 2014   TIA (transient ischemic attack)    July, 2013   Tobacco abuse, in remission 06/27/2009   Discontinued in 2009     Wears dentures    top plate   WPW (Wolff-Parkinson-White syndrome)    a. s/p RFCA @ [facility] - 1999    Family History  Problem Relation Age of Onset   Heart attack Mother 12   Hypertension Mother    Diabetes Mother    Kidney disease Mother    Breast cancer Mother    Heart attack Father 47   Hypertension Father    Diabetes Father    Stroke Brother    Heart attack Brother 47   Stroke Maternal Grandmother 19   Diabetes Brother    Hypertension Brother     Past Surgical History:  Procedure Laterality Date   AMPUTATION Left 05/19/2022   Procedure: TRANSMETATARSAL AMPUTATION LEFT FOOT;  Surgeon: Nadara Mustard, MD;  Location: [facility] OR;  Service: Orthopedics;  Laterality: Left;   AMPUTATION Left 06/17/2023   Procedure: LEFT BELOW KNEE AMPUTATION;  Surgeon: Nadara Mustard, MD;  Location: [facility] OR;  Service: Orthopedics;  Laterality: Left;   APPENDECTOMY     BACK SURGERY     1995   CARDIAC CATHETERIZATION     2009   CARDIAC DEFIBRILLATOR PLACEMENT     ICD was removed in 2018   CHOLECYSTECTOMY     CHOLECYSTECTOMY, LAPAROSCOPIC     11/2007   COLONOSCOPY W/ POLYPECTOMY  2009   ELBOW SURGERY Left 06/2010   ESOPHAGEAL DILATION N/A 11/08/2014   Procedure:  ESOPHAGEAL DILATION;  Surgeon: Malissa Hippo, MD;  Location: [facility];  Service: Endoscopy;  Laterality: N/A;  #56,    ESOPHAGOGASTRODUODENOSCOPY  03/31/2012   also 08/2011; [name]   ESOPHAGOGASTRODUODENOSCOPY (EGD) WITH PROPOFOL N/A 11/08/2014   Procedure: ESOPHAGOGASTRODUODENOSCOPY (EGD) WITH PROPOFOL;  Surgeon: Malissa Hippo, MD;  Location: [facility];  Service: Endoscopy;  Laterality: N/A;  Hiatus is 54 , GE Junction is 37   FOOT ARTHRODESIS Left 10/24/2020   Procedure: LEFT GASTROCNEMIUS RECESSION, DORSIFLEXION OSTEOTOMY 1ST MT;  Surgeon: Nadara Mustard, MD;  Location: [facility] OR;  Service: Orthopedics;  Laterality: Left;   FOOT ARTHRODESIS Right 01/09/2021   Procedure: CLOSING WEDGE OSTEOTOMY RIGHT 1ST METATARSAL;  Surgeon: Nadara Mustard, MD;  Location: [facility] OR;  Service: Orthopedics;  Laterality: Right;   GASTROCNEMIUS RECESSION Right 01/09/2021   Procedure: RIGHT GASTROCNEMIUS RECESSION;  Surgeon: Nadara Mustard, MD;  Location: [facility] OR;  Service: Orthopedics;  Laterality: Right;   ICD LEAD REMOVAL N/A 10/07/2016   Procedure: ICD LEAD REMOVAL ;  Surgeon: Marinus Maw, MD;  Location: [facility] OR;  Service: Cardiovascular;  Laterality: N/A;   MULTIPLE TOOTH EXTRACTIONS     RADIOFREQUENCY ABLATION  1999   WPW; performed at [facility]   TEE WITHOUT CARDIOVERSION N/A 10/07/2016   Procedure: TRANSESOPHAGEAL ECHOCARDIOGRAM (TEE);  Surgeon: Marinus Maw, MD;  Location: [facility] OR;  Service: Cardiovascular;  Laterality: N/A;   Social History   Occupational History   Occupation: Worked for [employer]    Employer: [employer]    Comment: Laid off in 11/11  Tobacco Use  Smoking status: Former    Current packs/day: 0.00    Average packs/day: 1.5 packs/day for 39.1 years (58.6 ttl pk-yrs)    Types: Cigarettes    Start date: 06/30/1971    Quit date: 07/30/2010    Years since quitting: 12.9   Smokeless tobacco: Former    Types: Chew    Quit date: 03/26/1994  Vaping Use   Vaping status: Former   Start  date: 07/30/2010   Quit date: 06/29/2016  Substance and Sexual Activity   Alcohol use: No    Alcohol/week: 1.0 standard drink of alcohol    Types: 1 Cans of beer per week    Comment: used "years ago"   Drug use: No   Sexual activity: Yes    Birth control/protection: None

## 2023-07-14 ENCOUNTER — Telehealth: Payer: Self-pay

## 2023-07-14 DIAGNOSIS — E1140 Type 2 diabetes mellitus with diabetic neuropathy, unspecified: Secondary | ICD-10-CM | POA: Diagnosis not present

## 2023-07-14 DIAGNOSIS — T8781 Dehiscence of amputation stump: Secondary | ICD-10-CM | POA: Diagnosis not present

## 2023-07-14 DIAGNOSIS — N182 Chronic kidney disease, stage 2 (mild): Secondary | ICD-10-CM | POA: Diagnosis not present

## 2023-07-14 DIAGNOSIS — I130 Hypertensive heart and chronic kidney disease with heart failure and stage 1 through stage 4 chronic kidney disease, or unspecified chronic kidney disease: Secondary | ICD-10-CM | POA: Diagnosis not present

## 2023-07-14 DIAGNOSIS — E1122 Type 2 diabetes mellitus with diabetic chronic kidney disease: Secondary | ICD-10-CM | POA: Diagnosis not present

## 2023-07-14 DIAGNOSIS — I428 Other cardiomyopathies: Secondary | ICD-10-CM | POA: Diagnosis not present

## 2023-07-14 DIAGNOSIS — M86172 Other acute osteomyelitis, left ankle and foot: Secondary | ICD-10-CM | POA: Diagnosis not present

## 2023-07-14 DIAGNOSIS — I5020 Unspecified systolic (congestive) heart failure: Secondary | ICD-10-CM | POA: Diagnosis not present

## 2023-07-14 DIAGNOSIS — I2510 Atherosclerotic heart disease of native coronary artery without angina pectoris: Secondary | ICD-10-CM | POA: Diagnosis not present

## 2023-07-14 DIAGNOSIS — G4730 Sleep apnea, unspecified: Secondary | ICD-10-CM | POA: Diagnosis not present

## 2023-07-14 NOTE — Telephone Encounter (Signed)
[facility] just wanted to call and report blood sugar, fasting was 226 and random 234 State they have to call and report

## 2023-07-15 ENCOUNTER — Telehealth: Payer: Self-pay | Admitting: Orthopedic Surgery

## 2023-07-15 ENCOUNTER — Other Ambulatory Visit: Payer: Self-pay | Admitting: Orthopedic Surgery

## 2023-07-15 MED ORDER — OXYCODONE-ACETAMINOPHEN 5-325 MG PO TABS: 1.0000 | ORAL_TABLET | ORAL | 0 refills | Status: DC | PRN

## 2023-07-15 NOTE — Telephone Encounter (Signed)
Received call from Leah (PT) with [facility] advised she saw patient yesterday and his pain level was a 7 out of 10. The number to contact Tacey Ruiz is [phone]

## 2023-07-15 NOTE — Telephone Encounter (Signed)
I called the pt and he states that for a few days he was having pain and thinks that its "related to this cold weather" the pt is s/p a BKA 06/17/2023 last refill of Oxycodone 5/325 #30 was 06/27/2023 can you refill this rx and send to [facility]

## 2023-07-20 DIAGNOSIS — M86172 Other acute osteomyelitis, left ankle and foot: Secondary | ICD-10-CM | POA: Diagnosis not present

## 2023-07-21 ENCOUNTER — Encounter: Payer: Self-pay | Admitting: Orthopedic Surgery

## 2023-07-21 ENCOUNTER — Ambulatory Visit: Payer: Medicare Other | Admitting: Orthopedic Surgery

## 2023-07-21 ENCOUNTER — Encounter (HOSPITAL_COMMUNITY): Payer: Self-pay | Admitting: Orthopedic Surgery

## 2023-07-21 DIAGNOSIS — Z89512 Acquired absence of left leg below knee: Secondary | ICD-10-CM | POA: Diagnosis not present

## 2023-07-21 DIAGNOSIS — L97511 Non-pressure chronic ulcer of other part of right foot limited to breakdown of skin: Secondary | ICD-10-CM

## 2023-07-21 DIAGNOSIS — T8781 Dehiscence of amputation stump: Secondary | ICD-10-CM | POA: Diagnosis not present

## 2023-07-21 DIAGNOSIS — S88112A Complete traumatic amputation at level between knee and ankle, left lower leg, initial encounter: Secondary | ICD-10-CM

## 2023-07-21 NOTE — Progress Notes (Signed)
SDW call  Patient was given pre-op instructions over the phone. Patient verbalized understanding of instructions provided.  Patient denies any changes since first of November     PCP - Dr. Elfredia Nevins Cardiologist - Dr. Charlton Haws  PPM/ICD - denies    Chest x-ray - 05/04/23 EKG -  05/04/23 Stress Test - 10/13/20 ECHO - 05/16/22 Cardiac Cath - 2009  OSA+; does not use a CPAP  Fasting Blood sugar range 162-185 but was 287 this morning How often check sugars - 1-2 a day   Blood Thinner Instructions: n/a Aspirin Instructions: pt states he is not taking it on DOS   ERAS Protcol - clears until 0815  COVID TEST- n/a     Anesthesia review: yes - sent to [facility]    Patient denies shortness of breath, fever, cough and chest pain over the phone call    Your procedure is scheduled on   Report to [facility] Main Entrance "A" at    8:45 A.M., then check in with the Admitting office.  Call this number if you have problems the morning of surgery:  [phone]   If you have any questions prior to your surgery date call [phone]: Open Monday-Friday 8am-4pm If you experience any cold or flu symptoms such as cough, fever, chills, shortness of breath, etc. between now and your scheduled surgery, please notify us at the above number     Remember:  Do not eat after midnight the night before your surgery  You may drink clear liquids until   8:15  the morning of your surgery.   Clear liquids allowed are: Water, Non-Citrus Juices (without pulp), Carbonated Beverages, Clear Tea, Black Coffee ONLY (NO MILK, CREAM OR POWDERED CREAMER of any kind), and Gatorade   Take these medicines the morning of surgery with A SIP OF WATER:   Atorvastatin Carvedilol Famotidine (Pepcid) Gabapentin Isosorbide Pantoprazole Tamsulosin Percocet - PRN   WHAT DO I DO ABOUT MY DIABETES MEDICATION?   Do not take oral diabetes medicines (pills) the morning of surgery.  THE NIGHT BEFORE SURGERY, take  10 units of Lantus Insulin and 35 units of Novolog 70/30 Insulin.          HOW TO MANAGE YOUR DIABETES BEFORE AND AFTER SURGERY   How do I manage my blood sugar before surgery? Check your blood sugar at least 4 times a day, starting 2 days before surgery, to make sure that the level is not too high or low.  Check your blood sugar the morning of your surgery when you wake up and every 2 hours until you get to the Short Stay unit.  If your blood sugar is less than 70 mg/dL, you will need to treat for low blood sugar: Do not take insulin. Treat a low blood sugar (less than 70 mg/dL) with  cup of clear juice (cranberry or apple), 4 glucose tablets, OR glucose gel. Recheck blood sugar in 15 minutes after treatment (to make sure it is greater than 70 mg/dL). If your blood sugar is not greater than 70 mg/dL on recheck, call [phone] for further instructions. Report your blood sugar to the short stay nurse when you get to Short Stay.  If you are admitted to the hospital after surgery: Your blood sugar will be checked by the staff and you will probably be given insulin after surgery (instead of oral diabetes medicines) to make sure you have good blood sugar levels. The goal for blood sugar control after surgery is 80-180  mg/dL.   As of today, STOP taking any Aspirin (unless otherwise instructed by your surgeon) Aleve, Naproxen, Ibuprofen, Motrin, Advil, Goody's, BC's, all herbal medications, fish oil, and all vitamins.

## 2023-07-21 NOTE — Progress Notes (Signed)
Office Visit Note   Patient: Mike Floyd           Date of Birth: 1960/09/15           MRN: [id number] Visit Date: 07/21/2023              Requested by: Elfredia Nevins, MD [address] [place],  [place] [address] PCP: Elfredia Nevins, MD  Chief Complaint  Patient presents with   Left Knee - Routine Post Op, Follow-up    06/17/2023 left BKA      HPI: Patient is a 62 year old gentleman who presents for 2 separate issues.  #1 dehiscence left below-knee amputation.  #2 Wagner grade 1 ulcer x 2 right foot.  Assessment & Plan: Visit Diagnoses:  1. Below-knee amputation of left lower extremity, initial encounter (HCC)   2. Non-pressure chronic ulcer of other part of right foot limited to breakdown of skin (HCC)   3. Dehiscence of amputation stump of left lower extremity (HCC)     Plan: Ulcers debrided x 2 right foot.  Will plan for revision transtibial amputation tomorrow for the left lower extremity.  Plan for admission with discharge back to home.  Risk and benefits were discussed including the risk for above-knee amputation on the left.  Patient and family state they understand wish to proceed at this time.  Follow-Up Instructions: Return in about 2 weeks (around 08/04/2023).   Ortho Exam  Patient is alert, oriented, no adenopathy, well-dressed, normal affect, normal respiratory effort. Examination of the left below-knee amputation there is wound dehiscence with purulent drainage.  There is cellulitis around the incision.  No ascending cellulitis.  Examination of the right foot there are 2 Wagner grade 1 ulcers.  After informed consent a 10 blade knife was used to debride the skin and soft tissue back to healthy viable tissue.  The ulcers measure 3 cm in diameter for the ulcer beneath the Charcot collapse and 4 cm in diameter over the forefoot.  There was good petechial bleeding and silver nitrate was used hemostasis.  Previous noninvasive arterial studies showed multiphasic  waveforms in both lower extremities with no elevated velocities or stenosis.  Imaging: No results found.    Labs: Lab Results  Component Value Date   HGBA1C 7.7 (H) 06/17/2023   HGBA1C 9.7 (H) 05/13/2022   HGBA1C 7.0 (H) 06/15/2017   ESRSEDRATE 2 03/14/2023   CRP <1 03/14/2023   CRP 0.6 05/16/2022   CRP 2.2 06/27/2019   LABURIC 5.5 03/07/2012   REPTSTATUS 05/18/2022 FINAL 05/13/2022   CULT  05/13/2022    NO GROWTH 5 DAYS Performed at [facility], [address] [place], [place] [address]      Lab Results  Component Value Date   ALBUMIN 4.0 06/17/2023   ALBUMIN 4.4 05/04/2023   ALBUMIN 4.1 02/26/2023   PREALBUMIN 23 06/17/2023    Lab Results  Component Value Date   MG 1.8 02/26/2023   MG 1.7 05/20/2022   MG 2.0 05/19/2022   Lab Results  Component Value Date   VD25OH 18 (L) 06/15/2017    Lab Results  Component Value Date   PREALBUMIN 23 06/17/2023      Latest Ref Rng & Units 06/17/2023    8:40 AM 05/04/2023    9:47 AM 02/26/2023   11:08 AM  CBC EXTENDED  WBC 4.0 - 10.5 K/uL 5.3  6.1    RBC 4.22 - 5.81 MIL/uL 4.71  4.86    Hemoglobin 13.0 - 17.0 g/dL 13.9  14.9  13.9   HCT 39.0 - 52.0 % 39.7  41.3  41.0   Platelets 150 - 400 K/uL 148  162    NEUT# 1.7 - 7.7 K/uL 3.6  4.5    Lymph# 0.7 - 4.0 K/uL 1.1  1.0       There is no height or weight on file to calculate BMI.  Orders:  No orders of the defined types were placed in this encounter.  No orders of the defined types were placed in this encounter.    Procedures: No procedures performed  Clinical Data: No additional findings.  ROS:  All other systems negative, except as noted in the HPI. Review of Systems  Objective: Vital Signs: There were no vitals taken for this visit.  Specialty Comments:  No specialty comments available.  PMFS History: Patient Active Problem List   Diagnosis Date Noted   Below-knee amputation of left lower extremity (HCC) 06/17/2023   Acute osteomyelitis of  metatarsal bone of left foot (HCC)    Subacute osteomyelitis, left ankle and foot (HCC) 05/13/2022   Non-pressure chronic ulcer of other part of right foot limited to breakdown of skin (HCC)    Contracture of right Achilles tendon    Mildly dilatd aortic root (HCC)    Contracture of left Achilles tendon    Metatarsal deformity, left    CAD (coronary artery disease)    Hallux hammertoe, left 12/11/2019   Diarrhea 06/27/2019   Diastasis recti 08/22/2018   Umbilical hernia without obstruction and without gangrene 08/22/2018   Vitamin D deficiency 06/23/2017   Essential hypertension, benign 06/15/2017   Mixed hyperlipidemia 06/15/2017   Class 1 obesity due to excess calories with serious comorbidity and body mass index (BMI) of 31.0 to 31.9 in adult 06/15/2017   Malfunction of implantable cardioverter-defibrillator (ICD) electrode 10/07/2016   Chronic kidney disease (CKD), stage III (moderate) (HCC) 04/04/2015   Bladder neck obstruction 03/21/2015   Difficult or painful urination 03/21/2015   Flank pain 03/21/2015   Delayed onset of urination 03/21/2015   Calculus of kidney 03/21/2015   Erosive esophagitis 12/27/2013   Hypotension due to drugs 07/02/2013   Chronic systolic heart failure (HCC) 04/24/2013   CAP (community acquired pneumonia) 03/16/2013   HTN (hypertension) 03/15/2013   Hydrocele 03/15/2013   Spermatocele 03/15/2013   DOE (dyspnea on exertion) 03/15/2013   OSA (obstructive sleep apnea) 01/09/2013   Chronic kidney disease, stage 3 (HCC) 01/03/2013   Chest pain 12/26/2012   History of diagnostic tests 09/06/2012   TIA (transient ischemic attack)    Cardiomyopathy, nonischemic (HCC) 02/24/2010   AICD (automatic cardioverter/defibrillator) 02/24/2010   DM type 2 causing vascular disease (HCC) 06/27/2009   Gout 06/27/2009   Tobacco abuse, in remission 06/27/2009   COLONIC POLYPS 10/31/2008   GERD (gastroesophageal reflux disease) 10/31/2008   Past Medical History:   Diagnosis Date   AICD (automatic cardioverter/defibrillator) present 11/2007   Removed in 2018, a. 11/2007 SJM Current VR - single lead ICD  - Removed 2018 - "it was burning me"   Anxiety    CAD (coronary artery disease)    non-obstructive CAD by Cor CT in 2020 // Myoview 3/22: EF 57, small inf-sept defect c/w scar, no ischemia; low risk      Chest pain    a. 10/2007 Cath:  normal Cors.   CKD (chronic kidney disease), stage II    DDD (degenerative disc disease), lumbar    Diabetes mellitus DX: 2010   type 2  Erosive esophagitis    a. per EGD (08/2011), Dr. Karilyn Cota - Erosive reflux esophagitis improved but not completely healed since previous EGD 3 years ago. Bx showing  ulcerated gatroesophageal junction mucosa. negative for H. pylori   GERD (gastroesophageal reflux disease)    Gout    Hearing deficit    a. wear bilateral hearing aides   History of hiatal hernia    History of kidney stones    passed stones, no surgery   Hypertension    Mildly dilatd aortic root (HCC)    CMR 4/22: EF 52, no LGE; d/w Dr. Lorretta Harp root 38 mm (mildly dilated)   Myocardial infarction [facility]) 2011   Neuropathy    Feet and legs   Nonischemic dilated cardiomyopathy (HCC)    a. H/O EF as low as 35-40% by LV gram 10/2007;  b. Echo 02/2011 EF 50-55%, inf HK, Gr 1 DD // CMR 4/22: EF 52, no LGE; d/w Dr. Lorretta Harp root 38 mm (mildly dilated)    Renal insufficiency    Sleep apnea    pt doesnt use, states"I cant afford one". PCP aware   Stroke (HCC)    mini-stroke in 2014   TIA (transient ischemic attack)    July, 2013   Tobacco abuse, in remission 06/27/2009   Discontinued in 2009     Wears dentures    top plate   WPW (Wolff-Parkinson-White syndrome)    a. s/p RFCA @ [facility] - 1999    Family History  Problem Relation Age of Onset   Heart attack Mother 73   Hypertension Mother    Diabetes Mother    Kidney disease Mother    Breast cancer Mother    Heart attack Father 68   Hypertension Father     Diabetes Father    Stroke Brother    Heart attack Brother 12   Stroke Maternal Grandmother 80   Diabetes Brother    Hypertension Brother     Past Surgical History:  Procedure Laterality Date   AMPUTATION Left 05/19/2022   Procedure: TRANSMETATARSAL AMPUTATION LEFT FOOT;  Surgeon: Nadara Mustard, MD;  Location: [facility] OR;  Service: Orthopedics;  Laterality: Left;   AMPUTATION Left 06/17/2023   Procedure: LEFT BELOW KNEE AMPUTATION;  Surgeon: Nadara Mustard, MD;  Location: [facility] OR;  Service: Orthopedics;  Laterality: Left;   APPENDECTOMY     BACK SURGERY     1995   CARDIAC CATHETERIZATION     2009   CARDIAC DEFIBRILLATOR PLACEMENT     ICD was removed in 2018   CHOLECYSTECTOMY     CHOLECYSTECTOMY, LAPAROSCOPIC     11/2007   COLONOSCOPY W/ POLYPECTOMY  2009   ELBOW SURGERY Left 06/2010   ESOPHAGEAL DILATION N/A 11/08/2014   Procedure: ESOPHAGEAL DILATION;  Surgeon: Malissa Hippo, MD;  Location: [facility] ORS;  Service: Endoscopy;  Laterality: N/A;  #56,    ESOPHAGOGASTRODUODENOSCOPY  03/31/2012   also 08/2011; [name]   ESOPHAGOGASTRODUODENOSCOPY (EGD) WITH PROPOFOL N/A 11/08/2014   Procedure: ESOPHAGOGASTRODUODENOSCOPY (EGD) WITH PROPOFOL;  Surgeon: Malissa Hippo, MD;  Location: [facility] ORS;  Service: Endoscopy;  Laterality: N/A;  Hiatus is 70 , GE [place] is 37   FOOT ARTHRODESIS Left 10/24/2020   Procedure: LEFT GASTROCNEMIUS RECESSION, DORSIFLEXION OSTEOTOMY 1ST MT;  Surgeon: Nadara Mustard, MD;  Location: [facility] OR;  Service: Orthopedics;  Laterality: Left;   FOOT ARTHRODESIS Right 01/09/2021   Procedure: CLOSING WEDGE OSTEOTOMY RIGHT 1ST METATARSAL;  Surgeon: Nadara Mustard, MD;  Location: [facility] OR;  Service: Orthopedics;  Laterality: Right;   GASTROCNEMIUS RECESSION Right 01/09/2021   Procedure: RIGHT GASTROCNEMIUS RECESSION;  Surgeon: Nadara Mustard, MD;  Location: [facility] OR;  Service: Orthopedics;  Laterality: Right;   ICD LEAD REMOVAL N/A 10/07/2016   Procedure: ICD LEAD REMOVAL ;  Surgeon: Marinus Maw, MD;  Location: [facility] OR;  Service: Cardiovascular;  Laterality: N/A;   MULTIPLE TOOTH EXTRACTIONS     RADIOFREQUENCY ABLATION  1999   WPW; performed at [facility]   TEE WITHOUT CARDIOVERSION N/A 10/07/2016   Procedure: TRANSESOPHAGEAL ECHOCARDIOGRAM (TEE);  Surgeon: Marinus Maw, MD;  Location: [facility] OR;  Service: Cardiovascular;  Laterality: N/A;   Social History   Occupational History   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    Comment: Laid off in 11/11  Tobacco Use   Smoking status: Former    Current packs/day: 0.00    Average packs/day: 1.5 packs/day for 39.1 years (58.6 ttl pk-yrs)    Types: Cigarettes    Start date: 06/30/1971    Quit date: 07/30/2010    Years since quitting: 12.9   Smokeless tobacco: Former    Types: Chew    Quit date: 03/26/1994  Vaping Use   Vaping status: Former   Start date: 07/30/2010   Quit date: 06/29/2016  Substance and Sexual Activity   Alcohol use: No    Alcohol/week: 1.0 standard drink of alcohol    Types: 1 Cans of beer per week    Comment: used "years ago"   Drug use: No   Sexual activity: Yes    Birth control/protection: None

## 2023-07-22 ENCOUNTER — Encounter (HOSPITAL_COMMUNITY): Payer: Self-pay | Admitting: Orthopedic Surgery

## 2023-07-22 ENCOUNTER — Other Ambulatory Visit: Payer: Self-pay

## 2023-07-22 ENCOUNTER — Inpatient Hospital Stay (HOSPITAL_COMMUNITY): Payer: Medicare Other | Admitting: Anesthesiology

## 2023-07-22 ENCOUNTER — Inpatient Hospital Stay (HOSPITAL_COMMUNITY)
Admission: RE | Admit: 2023-07-22 | Discharge: 2023-07-25 | DRG: 464 | Disposition: A | Discharge status: Home Health | Payer: Medicare Other | Attending: Orthopedic Surgery | Admitting: Orthopedic Surgery

## 2023-07-22 ENCOUNTER — Encounter (HOSPITAL_COMMUNITY)
Admission: RE | Disposition: A | Discharge status: Home Health | Payer: Self-pay | Source: Home / Self Care | Attending: Orthopedic Surgery

## 2023-07-22 DIAGNOSIS — T8781 Dehiscence of amputation stump: Principal | ICD-10-CM | POA: Diagnosis present

## 2023-07-22 DIAGNOSIS — Z87891 Personal history of nicotine dependence: Secondary | ICD-10-CM

## 2023-07-22 DIAGNOSIS — L97511 Non-pressure chronic ulcer of other part of right foot limited to breakdown of skin: Secondary | ICD-10-CM | POA: Diagnosis present

## 2023-07-22 DIAGNOSIS — I998 Other disorder of circulatory system: Secondary | ICD-10-CM

## 2023-07-22 DIAGNOSIS — I420 Dilated cardiomyopathy: Secondary | ICD-10-CM | POA: Diagnosis present

## 2023-07-22 DIAGNOSIS — I129 Hypertensive chronic kidney disease with stage 1 through stage 4 chronic kidney disease, or unspecified chronic kidney disease: Secondary | ICD-10-CM | POA: Diagnosis not present

## 2023-07-22 DIAGNOSIS — Z833 Family history of diabetes mellitus: Secondary | ICD-10-CM | POA: Diagnosis not present

## 2023-07-22 DIAGNOSIS — Z9049 Acquired absence of other specified parts of digestive tract: Secondary | ICD-10-CM | POA: Diagnosis not present

## 2023-07-22 DIAGNOSIS — Z974 Presence of external hearing-aid: Secondary | ICD-10-CM | POA: Diagnosis not present

## 2023-07-22 DIAGNOSIS — Z89512 Acquired absence of left leg below knee: Principal | ICD-10-CM

## 2023-07-22 DIAGNOSIS — Z8673 Personal history of transient ischemic attack (TIA), and cerebral infarction without residual deficits: Secondary | ICD-10-CM | POA: Diagnosis not present

## 2023-07-22 DIAGNOSIS — S88112A Complete traumatic amputation at level between knee and ankle, left lower leg, initial encounter: Secondary | ICD-10-CM

## 2023-07-22 DIAGNOSIS — T8130XA Disruption of wound, unspecified, initial encounter: Secondary | ICD-10-CM | POA: Diagnosis present

## 2023-07-22 DIAGNOSIS — Z8249 Family history of ischemic heart disease and other diseases of the circulatory system: Secondary | ICD-10-CM | POA: Diagnosis not present

## 2023-07-22 DIAGNOSIS — E1122 Type 2 diabetes mellitus with diabetic chronic kidney disease: Secondary | ICD-10-CM | POA: Diagnosis not present

## 2023-07-22 DIAGNOSIS — Z823 Family history of stroke: Secondary | ICD-10-CM

## 2023-07-22 DIAGNOSIS — Z95810 Presence of automatic (implantable) cardiac defibrillator: Secondary | ICD-10-CM

## 2023-07-22 DIAGNOSIS — N182 Chronic kidney disease, stage 2 (mild): Secondary | ICD-10-CM | POA: Diagnosis not present

## 2023-07-22 DIAGNOSIS — Y835 Amputation of limb(s) as the cause of abnormal reaction of the patient, or of later complication, without mention of misadventure at the time of the procedure: Secondary | ICD-10-CM | POA: Diagnosis present

## 2023-07-22 DIAGNOSIS — M51369 Other intervertebral disc degeneration, lumbar region without mention of lumbar back pain or lower extremity pain: Secondary | ICD-10-CM | POA: Diagnosis not present

## 2023-07-22 DIAGNOSIS — G4730 Sleep apnea, unspecified: Secondary | ICD-10-CM | POA: Diagnosis present

## 2023-07-22 DIAGNOSIS — I252 Old myocardial infarction: Secondary | ICD-10-CM | POA: Diagnosis not present

## 2023-07-22 DIAGNOSIS — H9190 Unspecified hearing loss, unspecified ear: Secondary | ICD-10-CM | POA: Diagnosis not present

## 2023-07-22 DIAGNOSIS — T8789 Other complications of amputation stump: Secondary | ICD-10-CM | POA: Diagnosis not present

## 2023-07-22 DIAGNOSIS — I2510 Atherosclerotic heart disease of native coronary artery without angina pectoris: Secondary | ICD-10-CM | POA: Diagnosis present

## 2023-07-22 DIAGNOSIS — Z87442 Personal history of urinary calculi: Secondary | ICD-10-CM

## 2023-07-22 DIAGNOSIS — N183 Chronic kidney disease, stage 3 unspecified: Secondary | ICD-10-CM | POA: Diagnosis not present

## 2023-07-22 HISTORY — PX: STUMP REVISION: SHX6102

## 2023-07-22 LAB — BASIC METABOLIC PANEL
Anion gap: 11 (ref 5–15)
BUN: 8 mg/dL (ref 8–23)
CO2: 24 mmol/L (ref 22–32)
Calcium: 9.2 mg/dL (ref 8.9–10.3)
Chloride: 100 mmol/L (ref 98–111)
Creatinine, Ser: 0.97 mg/dL (ref 0.61–1.24)
GFR, Estimated: >60 mL/min (ref >60)
Glucose, Bld: 126 mg/dL — ABNORMAL HIGH (ref 70–99)
Potassium: 3.6 mmol/L (ref 3.5–5.1)
Sodium: 135 mmol/L (ref 135–145)

## 2023-07-22 LAB — GLUCOSE, CAPILLARY
Glucose-Capillary: 145 mg/dL — ABNORMAL HIGH (ref 70–99)
Glucose-Capillary: 141 mg/dL — ABNORMAL HIGH (ref 70–99)
Glucose-Capillary: 187 mg/dL — ABNORMAL HIGH (ref 70–99)
Glucose-Capillary: 272 mg/dL — ABNORMAL HIGH (ref 70–99)

## 2023-07-22 LAB — CBC
HCT: 40.8 % (ref 39.0–52.0)
Hemoglobin: 14 g/dL (ref 13.0–17.0)
MCH: 28.7 pg (ref 26.0–34.0)
MCHC: 34.3 g/dL (ref 30.0–36.0)
MCV: 83.6 fL (ref 80.0–100.0)
Platelets: 164 10*3/uL (ref 150–400)
RBC: 4.88 MIL/uL (ref 4.22–5.81)
RDW: 14.3 % (ref 11.5–15.5)
WBC: 8.4 10*3/uL (ref 4.0–10.5)
nRBC: 0 % (ref 0.0–0.2)

## 2023-07-22 SURGERY — REVISION, AMPUTATION SITE
Anesthesia: General | Laterality: Left

## 2023-07-22 MED ORDER — INSULIN ASPART 100 UNIT/ML IJ SOLN
0.0000 [IU] | INTRAMUSCULAR | Status: DC | PRN
  Administered 2023-07-22: 2 [IU] via SUBCUTANEOUS
  Filled 2023-07-22: qty 1

## 2023-07-22 MED ORDER — CEFAZOLIN SODIUM-DEXTROSE 2-4 GM/100ML-% IV SOLN
INTRAVENOUS | Status: AC
  Filled 2023-07-22: qty 100

## 2023-07-22 MED ORDER — INSULIN GLARGINE-YFGN 100 UNIT/ML ~~LOC~~ SOLN
10.0000 [IU] | Freq: Every day | SUBCUTANEOUS | Status: DC
  Administered 2023-07-22 – 2023-07-24 (×3): 10 [IU] via SUBCUTANEOUS
  Filled 2023-07-22 (×4): qty 0.1

## 2023-07-22 MED ORDER — FENTANYL CITRATE (PF) 250 MCG/5ML IJ SOLN
INTRAMUSCULAR | Status: DC | PRN
  Administered 2023-07-22 (×5): 50 ug via INTRAVENOUS

## 2023-07-22 MED ORDER — PROPOFOL 10 MG/ML IV BOLUS
INTRAVENOUS | Status: DC | PRN
  Administered 2023-07-22: 150 mg via INTRAVENOUS

## 2023-07-22 MED ORDER — CEFAZOLIN SODIUM-DEXTROSE 2-4 GM/100ML-% IV SOLN
2.0000 g | Freq: Three times a day (TID) | INTRAVENOUS | Status: AC
Start: 2023-07-22 — End: 2023-07-23
  Administered 2023-07-22 – 2023-07-23 (×2): 2 g via INTRAVENOUS
  Filled 2023-07-22 (×2): qty 100

## 2023-07-22 MED ORDER — PHENYLEPHRINE 80 MCG/ML (10ML) SYRINGE FOR IV PUSH (FOR BLOOD PRESSURE SUPPORT)
PREFILLED_SYRINGE | INTRAVENOUS | Status: AC
  Filled 2023-07-22: qty 10

## 2023-07-22 MED ORDER — LACTATED RINGERS IV SOLN: INTRAVENOUS | Status: DC | PRN

## 2023-07-22 MED ORDER — GABAPENTIN 300 MG PO CAPS
600.0000 mg | ORAL_CAPSULE | Freq: Three times a day (TID) | ORAL | Status: DC
  Administered 2023-07-22 – 2023-07-25 (×9): 600 mg via ORAL
  Filled 2023-07-22 (×9): qty 2

## 2023-07-22 MED ORDER — EPHEDRINE 5 MG/ML INJ
INTRAVENOUS | Status: AC
  Filled 2023-07-22: qty 5

## 2023-07-22 MED ORDER — DEXMEDETOMIDINE HCL IN NACL 80 MCG/20ML IV SOLN
INTRAVENOUS | Status: AC
  Filled 2023-07-22: qty 20

## 2023-07-22 MED ORDER — LABETALOL HCL 5 MG/ML IV SOLN: 10.0000 mg | INTRAVENOUS | Status: DC | PRN

## 2023-07-22 MED ORDER — PHENYLEPHRINE 80 MCG/ML (10ML) SYRINGE FOR IV PUSH (FOR BLOOD PRESSURE SUPPORT)
PREFILLED_SYRINGE | INTRAVENOUS | Status: DC | PRN
  Administered 2023-07-22 (×3): 160 ug via INTRAVENOUS

## 2023-07-22 MED ORDER — BISACODYL 5 MG PO TBEC: 5.0000 mg | DELAYED_RELEASE_TABLET | Freq: Every day | ORAL | Status: DC | PRN

## 2023-07-22 MED ORDER — GUAIFENESIN-DM 100-10 MG/5ML PO SYRP
15.0000 mL | ORAL_SOLUTION | ORAL | Status: DC | PRN
Start: 2023-07-22 — End: 2023-07-25

## 2023-07-22 MED ORDER — ORAL CARE MOUTH RINSE: 15.0000 mL | Freq: Once | OROMUCOSAL | Status: AC

## 2023-07-22 MED ORDER — TAMSULOSIN HCL 0.4 MG PO CAPS
0.4000 mg | ORAL_CAPSULE | Freq: Every day | ORAL | Status: DC
  Administered 2023-07-23 – 2023-07-25 (×3): 0.4 mg via ORAL
  Filled 2023-07-22 (×3): qty 1

## 2023-07-22 MED ORDER — VASHE WOUND IRRIGATION OPTIME
TOPICAL | Status: DC | PRN
  Administered 2023-07-22: 34 [oz_av]

## 2023-07-22 MED ORDER — METOPROLOL TARTRATE 5 MG/5ML IV SOLN
2.0000 mg | INTRAVENOUS | Status: DC | PRN
Start: 2023-07-22 — End: 2023-07-25

## 2023-07-22 MED ORDER — OXYCODONE HCL 5 MG PO TABS
10.0000 mg | ORAL_TABLET | ORAL | Status: DC | PRN
  Administered 2023-07-22 – 2023-07-24 (×5): 15 mg via ORAL
  Filled 2023-07-22 (×5): qty 3

## 2023-07-22 MED ORDER — ONDANSETRON HCL 4 MG/2ML IJ SOLN: 4.0000 mg | Freq: Four times a day (QID) | INTRAMUSCULAR | Status: DC | PRN

## 2023-07-22 MED ORDER — INSULIN ASPART 100 UNIT/ML IJ SOLN
0.0000 [IU] | Freq: Three times a day (TID) | INTRAMUSCULAR | Status: DC
  Administered 2023-07-22: 3 [IU] via SUBCUTANEOUS
  Administered 2023-07-23: 8 [IU] via SUBCUTANEOUS
  Administered 2023-07-23 – 2023-07-24 (×3): 5 [IU] via SUBCUTANEOUS
  Administered 2023-07-24: 3 [IU] via SUBCUTANEOUS
  Administered 2023-07-24: 5 [IU] via SUBCUTANEOUS
  Administered 2023-07-25: 3 [IU] via SUBCUTANEOUS

## 2023-07-22 MED ORDER — MIDAZOLAM HCL 2 MG/2ML IJ SOLN
INTRAMUSCULAR | Status: AC
  Filled 2023-07-22: qty 2

## 2023-07-22 MED ORDER — VITAMIN B-12 1000 MCG PO TABS
1000.0000 ug | ORAL_TABLET | Freq: Every day | ORAL | Status: DC
  Administered 2023-07-23 – 2023-07-25 (×3): 1000 ug via ORAL
  Filled 2023-07-22 (×3): qty 1

## 2023-07-22 MED ORDER — ATORVASTATIN CALCIUM 10 MG PO TABS
20.0000 mg | ORAL_TABLET | Freq: Every morning | ORAL | Status: DC
  Administered 2023-07-23 – 2023-07-25 (×3): 20 mg via ORAL
  Filled 2023-07-22 (×3): qty 2

## 2023-07-22 MED ORDER — FENTANYL CITRATE (PF) 100 MCG/2ML IJ SOLN
INTRAMUSCULAR | Status: AC
  Administered 2023-07-22: 50 ug via INTRAVENOUS
  Filled 2023-07-22: qty 2

## 2023-07-22 MED ORDER — ZINC SULFATE 220 (50 ZN) MG PO CAPS
220.0000 mg | ORAL_CAPSULE | Freq: Every day | ORAL | Status: DC
  Administered 2023-07-22 – 2023-07-25 (×4): 220 mg via ORAL
  Filled 2023-07-22 (×4): qty 1

## 2023-07-22 MED ORDER — ROCURONIUM BROMIDE 10 MG/ML (PF) SYRINGE
PREFILLED_SYRINGE | INTRAVENOUS | Status: AC
  Filled 2023-07-22: qty 10

## 2023-07-22 MED ORDER — HYDROMORPHONE HCL 1 MG/ML IJ SOLN
0.5000 mg | INTRAMUSCULAR | Status: DC | PRN
  Administered 2023-07-22 – 2023-07-23 (×3): 1 mg via INTRAVENOUS
  Filled 2023-07-22 (×4): qty 1

## 2023-07-22 MED ORDER — TRANEXAMIC ACID 1000 MG/10ML IV SOLN
2000.0000 mg | INTRAVENOUS | Status: DC
  Filled 2023-07-22: qty 20

## 2023-07-22 MED ORDER — ASPIRIN 81 MG PO TBEC
81.0000 mg | DELAYED_RELEASE_TABLET | Freq: Every day | ORAL | Status: DC
  Administered 2023-07-22 – 2023-07-25 (×4): 81 mg via ORAL
  Filled 2023-07-22 (×4): qty 1

## 2023-07-22 MED ORDER — ISOSORBIDE MONONITRATE ER 30 MG PO TB24
30.0000 mg | ORAL_TABLET | Freq: Every day | ORAL | Status: DC
  Administered 2023-07-23 – 2023-07-25 (×3): 30 mg via ORAL
  Filled 2023-07-22 (×3): qty 1

## 2023-07-22 MED ORDER — HYDROMORPHONE HCL 1 MG/ML IJ SOLN
INTRAMUSCULAR | Status: AC
  Administered 2023-07-22: 0.5 mg via INTRAVENOUS
  Filled 2023-07-22: qty 1

## 2023-07-22 MED ORDER — SODIUM CHLORIDE 0.9 % IV SOLN: INTRAVENOUS | Status: DC

## 2023-07-22 MED ORDER — DOCUSATE SODIUM 100 MG PO CAPS
100.0000 mg | ORAL_CAPSULE | Freq: Every day | ORAL | Status: DC
  Administered 2023-07-23 – 2023-07-24 (×2): 100 mg via ORAL
  Filled 2023-07-22 (×3): qty 1

## 2023-07-22 MED ORDER — POTASSIUM CHLORIDE CRYS ER 20 MEQ PO TBCR
20.0000 meq | EXTENDED_RELEASE_TABLET | Freq: Every day | ORAL | Status: DC | PRN
Start: 2023-07-22 — End: 2023-07-22

## 2023-07-22 MED ORDER — ZOLPIDEM TARTRATE 5 MG PO TABS
10.0000 mg | ORAL_TABLET | Freq: Every day | ORAL | Status: DC
  Administered 2023-07-22 – 2023-07-24 (×3): 10 mg via ORAL
  Filled 2023-07-22 (×3): qty 2

## 2023-07-22 MED ORDER — ALBUMIN HUMAN 5 % IV SOLN: INTRAVENOUS | Status: DC | PRN

## 2023-07-22 MED ORDER — CHLORHEXIDINE GLUCONATE 0.12 % MT SOLN
OROMUCOSAL | Status: AC
  Filled 2023-07-22: qty 15

## 2023-07-22 MED ORDER — LIDOCAINE 2% (20 MG/ML) 5 ML SYRINGE
INTRAMUSCULAR | Status: AC
  Filled 2023-07-22: qty 10

## 2023-07-22 MED ORDER — CARVEDILOL 12.5 MG PO TABS
12.5000 mg | ORAL_TABLET | Freq: Two times a day (BID) | ORAL | Status: DC
  Administered 2023-07-22 – 2023-07-25 (×5): 12.5 mg via ORAL
  Filled 2023-07-22 (×5): qty 1

## 2023-07-22 MED ORDER — OXYCODONE HCL 5 MG/5ML PO SOLN
5.0000 mg | Freq: Once | ORAL | Status: DC | PRN
Start: 2023-07-22 — End: 2023-07-22

## 2023-07-22 MED ORDER — FENTANYL CITRATE (PF) 250 MCG/5ML IJ SOLN
INTRAMUSCULAR | Status: AC
  Filled 2023-07-22: qty 5

## 2023-07-22 MED ORDER — CHLORHEXIDINE GLUCONATE 0.12 % MT SOLN
15.0000 mL | Freq: Once | OROMUCOSAL | Status: AC
  Administered 2023-07-22: 15 mL via OROMUCOSAL

## 2023-07-22 MED ORDER — VANCOMYCIN HCL 1000 MG IV SOLR
INTRAVENOUS | Status: DC | PRN
  Administered 2023-07-22: 1000 mg

## 2023-07-22 MED ORDER — DEXAMETHASONE SODIUM PHOSPHATE 10 MG/ML IJ SOLN
INTRAMUSCULAR | Status: DC | PRN
  Administered 2023-07-22: 5 mg via INTRAVENOUS

## 2023-07-22 MED ORDER — VITAMIN C 500 MG PO TABS
1000.0000 mg | ORAL_TABLET | Freq: Every day | ORAL | Status: DC
  Administered 2023-07-22 – 2023-07-25 (×4): 1000 mg via ORAL
  Filled 2023-07-22 (×4): qty 2

## 2023-07-22 MED ORDER — INSULIN ASPART 100 UNIT/ML IJ SOLN
4.0000 [IU] | Freq: Three times a day (TID) | INTRAMUSCULAR | Status: DC
  Administered 2023-07-22 – 2023-07-25 (×8): 4 [IU] via SUBCUTANEOUS

## 2023-07-22 MED ORDER — ONDANSETRON HCL 4 MG/2ML IJ SOLN
INTRAMUSCULAR | Status: DC | PRN
  Administered 2023-07-22: 4 mg via INTRAVENOUS

## 2023-07-22 MED ORDER — LIDOCAINE 2% (20 MG/ML) 5 ML SYRINGE
INTRAMUSCULAR | Status: DC | PRN
  Administered 2023-07-22: 60 mg via INTRAVENOUS

## 2023-07-22 MED ORDER — PHENYLEPHRINE HCL-NACL 20-0.9 MG/250ML-% IV SOLN
INTRAVENOUS | Status: DC | PRN
  Administered 2023-07-22: 45 ug/min via INTRAVENOUS

## 2023-07-22 MED ORDER — FUROSEMIDE 20 MG PO TABS
20.0000 mg | ORAL_TABLET | Freq: Every day | ORAL | Status: DC
  Administered 2023-07-22 – 2023-07-25 (×4): 20 mg via ORAL
  Filled 2023-07-22 (×4): qty 1

## 2023-07-22 MED ORDER — TRANEXAMIC ACID-NACL 1000-0.7 MG/100ML-% IV SOLN
1000.0000 mg | INTRAVENOUS | Status: AC
Start: 2023-07-22 — End: 2023-07-22
  Administered 2023-07-22: 1000 mg via INTRAVENOUS
  Filled 2023-07-22: qty 100

## 2023-07-22 MED ORDER — MAGNESIUM CITRATE PO SOLN: 1.0000 | Freq: Once | ORAL | Status: DC | PRN

## 2023-07-22 MED ORDER — HYDRALAZINE HCL 20 MG/ML IJ SOLN
5.0000 mg | INTRAMUSCULAR | Status: DC | PRN
Start: 2023-07-22 — End: 2023-07-25

## 2023-07-22 MED ORDER — JUVEN PO PACK
1.0000 | PACK | Freq: Two times a day (BID) | ORAL | Status: DC
  Administered 2023-07-22 – 2023-07-25 (×6): 1 via ORAL
  Filled 2023-07-22 (×6): qty 1

## 2023-07-22 MED ORDER — PANTOPRAZOLE SODIUM 40 MG PO TBEC
40.0000 mg | DELAYED_RELEASE_TABLET | Freq: Every day | ORAL | Status: DC
  Administered 2023-07-22 – 2023-07-25 (×4): 40 mg via ORAL
  Filled 2023-07-22 (×4): qty 1

## 2023-07-22 MED ORDER — DEXAMETHASONE SODIUM PHOSPHATE 10 MG/ML IJ SOLN
INTRAMUSCULAR | Status: AC
  Filled 2023-07-22: qty 2

## 2023-07-22 MED ORDER — ACETAMINOPHEN 325 MG PO TABS
325.0000 mg | ORAL_TABLET | Freq: Four times a day (QID) | ORAL | Status: DC | PRN
Start: 2023-07-23 — End: 2023-07-25
  Administered 2023-07-24 – 2023-07-25 (×2): 650 mg via ORAL
  Filled 2023-07-22 (×2): qty 2

## 2023-07-22 MED ORDER — FENTANYL CITRATE (PF) 100 MCG/2ML IJ SOLN
25.0000 ug | INTRAMUSCULAR | Status: DC | PRN
  Administered 2023-07-22: 50 ug via INTRAVENOUS

## 2023-07-22 MED ORDER — PHENOL 1.4 % MT LIQD: 1.0000 | OROMUCOSAL | Status: DC | PRN

## 2023-07-22 MED ORDER — ONDANSETRON HCL 4 MG/2ML IJ SOLN
INTRAMUSCULAR | Status: AC
  Filled 2023-07-22: qty 4

## 2023-07-22 MED ORDER — OXYCODONE HCL 5 MG PO TABS: 5.0000 mg | ORAL_TABLET | ORAL | Status: DC | PRN

## 2023-07-22 MED ORDER — HYDROMORPHONE HCL 1 MG/ML IJ SOLN
0.2500 mg | INTRAMUSCULAR | Status: DC | PRN
  Administered 2023-07-22: 0.5 mg via INTRAVENOUS

## 2023-07-22 MED ORDER — POLYETHYLENE GLYCOL 3350 17 G PO PACK: 17.0000 g | PACK | Freq: Every day | ORAL | Status: DC | PRN

## 2023-07-22 MED ORDER — CEFAZOLIN SODIUM-DEXTROSE 2-4 GM/100ML-% IV SOLN
2.0000 g | INTRAVENOUS | Status: AC
  Administered 2023-07-22: 2 g via INTRAVENOUS

## 2023-07-22 MED ORDER — OXYCODONE HCL 5 MG PO TABS: 5.0000 mg | ORAL_TABLET | Freq: Once | ORAL | Status: DC | PRN

## 2023-07-22 MED ORDER — 0.9 % SODIUM CHLORIDE (POUR BTL) OPTIME
TOPICAL | Status: DC | PRN
  Administered 2023-07-22: 1000 mL

## 2023-07-22 MED ORDER — MAGNESIUM SULFATE 2 GM/50ML IV SOLN
2.0000 g | Freq: Every day | INTRAVENOUS | Status: DC | PRN
  Filled 2023-07-22: qty 50

## 2023-07-22 MED ORDER — POTASSIUM CHLORIDE CRYS ER 20 MEQ PO TBCR
20.0000 meq | EXTENDED_RELEASE_TABLET | Freq: Every day | ORAL | Status: DC
  Administered 2023-07-22 – 2023-07-25 (×4): 20 meq via ORAL
  Filled 2023-07-22 (×4): qty 1

## 2023-07-22 MED ORDER — ALUM & MAG HYDROXIDE-SIMETH 200-200-20 MG/5ML PO SUSP: 15.0000 mL | ORAL | Status: DC | PRN

## 2023-07-22 SURGICAL SUPPLY — 31 items
BAG COUNTER SPONGE SURGICOUNT (BAG) ×1 IMPLANT
BLADE SAW RECIP 87.9 MT (BLADE) IMPLANT
BLADE SURG 21 STRL SS (BLADE) ×1 IMPLANT
CANISTER WOUND CARE 500ML ATS (WOUND CARE) ×1 IMPLANT
CLEANSER WND VASHE 34 (WOUND CARE) IMPLANT
COVER SURGICAL LIGHT HANDLE (MISCELLANEOUS) ×1 IMPLANT
DRAPE DERMATAC (DRAPES) IMPLANT
DRAPE EXTREMITY T 121X128X90 (DISPOSABLE) ×1 IMPLANT
DRAPE HALF SHEET 40X57 (DRAPES) ×1 IMPLANT
DRAPE INCISE IOBAN 66X45 STRL (DRAPES) ×1 IMPLANT
DRAPE U-SHAPE 47X51 STRL (DRAPES) ×2 IMPLANT
DRESSING PREVENA PLUS CUSTOM (GAUZE/BANDAGES/DRESSINGS) ×1 IMPLANT
DRSG PREVENA PLUS CUSTOM (GAUZE/BANDAGES/DRESSINGS) ×1
DURAPREP 26ML APPLICATOR (WOUND CARE) ×1 IMPLANT
ELECT REM PT RETURN 9FT ADLT (ELECTROSURGICAL) ×1
ELECTRODE REM PT RTRN 9FT ADLT (ELECTROSURGICAL) ×1 IMPLANT
GLOVE BIOGEL PI IND STRL 9 (GLOVE) ×1 IMPLANT
GLOVE SURG ORTHO 9.0 STRL STRW (GLOVE) ×1 IMPLANT
GOWN STRL REUS W/ TWL XL LVL3 (GOWN DISPOSABLE) ×2 IMPLANT
GRAFT SKIN WND MICRO 38 (Tissue) IMPLANT
KIT BASIN OR (CUSTOM PROCEDURE TRAY) ×1 IMPLANT
KIT TURNOVER KIT B (KITS) ×1 IMPLANT
MANIFOLD NEPTUNE II (INSTRUMENTS) ×1 IMPLANT
NS IRRIG 1000ML POUR BTL (IV SOLUTION) ×1 IMPLANT
PACK GENERAL/GYN (CUSTOM PROCEDURE TRAY) ×1 IMPLANT
PAD ARMBOARD 7.5X6 YLW CONV (MISCELLANEOUS) ×1 IMPLANT
PREVENA RESTOR ARTHOFORM 46X30 (CANNISTER) ×1 IMPLANT
STAPLER VISISTAT 35W (STAPLE) IMPLANT
SUT ETHILON 2 0 PSLX (SUTURE) ×2 IMPLANT
SUT SILK 2-0 18XBRD TIE 12 (SUTURE) IMPLANT
TOWEL GREEN STERILE (TOWEL DISPOSABLE) ×1 IMPLANT

## 2023-07-22 NOTE — Plan of Care (Signed)
  Problem: Education: Goal: Knowledge of General Education information will improve Description: Including pain rating scale, medication(s)/side effects and non-pharmacologic comfort measures Outcome: Progressing   Problem: Health Behavior/Discharge Planning: Goal: Ability to manage health-related needs will improve Outcome: Progressing   Problem: Clinical Measurements: Goal: Ability to maintain clinical measurements within normal limits will improve Outcome: Progressing Goal: Will remain free from infection Outcome: Progressing Goal: Diagnostic test results will improve Outcome: Progressing Goal: Respiratory complications will improve Outcome: Progressing Goal: Cardiovascular complication will be avoided Outcome: Progressing   Problem: Activity: Goal: Risk for activity intolerance will decrease Outcome: Progressing   Problem: Nutrition: Goal: Adequate nutrition will be maintained Outcome: Progressing   Problem: Coping: Goal: Level of anxiety will decrease Outcome: Progressing   Problem: Elimination: Goal: Will not experience complications related to bowel motility Outcome: Progressing Goal: Will not experience complications related to urinary retention Outcome: Progressing   Problem: Pain Management: Goal: General experience of comfort will improve Outcome: Progressing   Problem: Safety: Goal: Ability to remain free from injury will improve Outcome: Progressing   Problem: Skin Integrity: Goal: Risk for impaired skin integrity will decrease Outcome: Progressing   Problem: Education: Goal: Ability to describe self-care measures that may prevent or decrease complications (Diabetes Survival Skills Education) will improve Outcome: Progressing Goal: Individualized Educational Video(s) Outcome: Progressing   Problem: Coping: Goal: Ability to adjust to condition or change in health will improve Outcome: Progressing   Problem: Fluid Volume: Goal: Ability to  maintain a balanced intake and output will improve Outcome: Progressing   Problem: Health Behavior/Discharge Planning: Goal: Ability to identify and utilize available resources and services will improve Outcome: Progressing Goal: Ability to manage health-related needs will improve Outcome: Progressing   Problem: Metabolic: Goal: Ability to maintain appropriate glucose levels will improve Outcome: Progressing   Problem: Nutritional: Goal: Maintenance of adequate nutrition will improve Outcome: Progressing Goal: Progress toward achieving an optimal weight will improve Outcome: Progressing   Problem: Skin Integrity: Goal: Risk for impaired skin integrity will decrease Outcome: Progressing   Problem: Tissue Perfusion: Goal: Adequacy of tissue perfusion will improve Outcome: Progressing   Problem: Education: Goal: Knowledge of the prescribed therapeutic regimen will improve Outcome: Progressing Goal: Ability to verbalize activity precautions or restrictions will improve Outcome: Progressing Goal: Understanding of discharge needs will improve Outcome: Progressing   Problem: Activity: Goal: Ability to perform//tolerate increased activity and mobilize with assistive devices will improve Outcome: Progressing   Problem: Clinical Measurements: Goal: Postoperative complications will be avoided or minimized Outcome: Progressing   Problem: Self-Care: Goal: Ability to meet self-care needs will improve Outcome: Progressing   Problem: Self-Concept: Goal: Ability to maintain and perform role responsibilities to the fullest extent possible will improve Outcome: Progressing   Problem: Pain Management: Goal: Pain level will decrease with appropriate interventions Outcome: Progressing   Problem: Skin Integrity: Goal: Demonstration of wound healing without infection will improve Outcome: Progressing

## 2023-07-22 NOTE — Plan of Care (Signed)
  Problem: Education: Goal: Knowledge of General Education information will improve Description: Including pain rating scale, medication(s)/side effects and non-pharmacologic comfort measures Outcome: Progressing   Problem: Nutrition: Goal: Adequate nutrition will be maintained Outcome: Progressing   Problem: Coping: Goal: Level of anxiety will decrease Outcome: Progressing   Problem: Pain Management: Goal: General experience of comfort will improve Outcome: Progressing

## 2023-07-22 NOTE — Transfer of Care (Signed)
Immediate Anesthesia Transfer of Care Note  Patient: Mike Floyd  Procedure(s) Performed: REVISION LEFT BELOW KNEE AMPUTATION (Left)  Patient Location: PACU  Anesthesia Type:General  Level of Consciousness: drowsy and patient cooperative  Airway & Oxygen Therapy: Patient Spontanous Breathing and Patient connected to nasal cannula oxygen  Post-op Assessment: Report given to RN, Post -op Vital signs reviewed and stable, and Patient moving all extremities X 4  Post vital signs: Reviewed and stable  Last Vitals:  Vitals Value Taken Time  BP 110/75 07/22/23 1215  Temp    Pulse 71 07/22/23 1215  Resp 17 07/22/23 1215  SpO2 96 % 07/22/23 1215  Vitals shown include unfiled device data.  Last Pain:  Vitals:   07/22/23 0920  PainSc: 4       Patients Stated Pain Goal: 1 (07/22/23 0920)  Complications: No notable events documented.

## 2023-07-22 NOTE — Anesthesia Procedure Notes (Signed)
Procedure Name: LMA Insertion Date/Time: 07/22/2023 11:27 AM  Performed by: Alease Medina, CRNAPre-anesthesia Checklist: Patient identified, Emergency Drugs available, Suction available and Patient being monitored Patient Re-evaluated:Patient Re-evaluated prior to induction Oxygen Delivery Method: Circle system utilized Preoxygenation: Pre-oxygenation with 100% oxygen Induction Type: IV induction Ventilation: Mask ventilation without difficulty LMA: LMA inserted LMA Size: 5.0 Number of attempts: 1 Placement Confirmation: positive ETCO2, breath sounds checked- equal and bilateral and CO2 detector Tube secured with: Tape Dental Injury: Teeth and Oropharynx as per pre-operative assessment

## 2023-07-22 NOTE — Progress Notes (Signed)
Pt presents with diabetic ulcers on right foot.  One round 4cm x 5 cm upper pad of foot.  Two small 1cm x05.cm posterior mid side foot.  Cleansed and changed with new mepilex pad.

## 2023-07-22 NOTE — Anesthesia Preprocedure Evaluation (Signed)
Anesthesia Evaluation  Patient identified by MRN, date of birth, ID band Patient awake    Reviewed: Allergy & Precautions, H&P , NPO status , Patient's Chart, lab work & pertinent test results  Airway Mallampati: II  TM Distance: >3 FB Neck ROM: full    Dental  (+) Edentulous Upper, Edentulous Lower   Pulmonary sleep apnea , former smoker   Pulmonary exam normal breath sounds clear to auscultation       Cardiovascular hypertension, + CAD, + Past MI and + DOE  Normal cardiovascular exam+ Cardiac Defibrillator  Rhythm:regular Rate:Normal     Neuro/Psych  PSYCHIATRIC DISORDERS Anxiety     TIACVA    GI/Hepatic hiatal hernia, PUD,GERD  ,,  Endo/Other  diabetes, Type 2    Renal/GU Renal InsufficiencyRenal disease     Musculoskeletal  (+) Arthritis , Osteoarthritis,    Abdominal   Peds  Hematology   Anesthesia Other Findings   Reproductive/Obstetrics                             Anesthesia Physical Anesthesia Plan  ASA: 3  Anesthesia Plan: General   Post-op Pain Management:    Induction: Intravenous  PONV Risk Score and Plan: 2 and Ondansetron, Midazolam and Treatment may vary due to age or medical condition  Airway Management Planned: LMA  Additional Equipment:   Intra-op Plan:   Post-operative Plan: Extubation in OR  Informed Consent: I have reviewed the patients History and Physical, chart, labs and discussed the procedure including the risks, benefits and alternatives for the proposed anesthesia with the patient or authorized representative who has indicated his/her understanding and acceptance.     Dental advisory given  Plan Discussed with: CRNA, Anesthesiologist and Surgeon  Anesthesia Plan Comments: (PAT note by Antionette Poles, PA-C:  62 year old male follows with cardiology for history of nonobstructive CAD, CVA, HTN, HLD, WPW (s/p RFA in 1999), history of ICD (placed in  2009 but removed in 2018), heart failure with improved EF (EF 50 to 55% by echo 05/2022), palpitations, HLD, mildly dilated aortic root, DOE.  Coronary CTA 05/2023 showed only 0 to 24% stenosis along the left main, proximal/mid LAD, and proximal left circumflex.  Recent chest pains were felt to be noncardiac, possibly secondary to history of esophageal dysmotility/stricture.  Recent monitor in September 2024 showed predominantly NSR with rare PACs and PVCs.  Other pertinent history includes CKD 2, OSA, GERD, hiatal hernia, IDDM2 (last A1c in epic 9.7 on 05/13/2022).  Patient will need day of surgery labs and evaluation.  EKG 05/04/2023: Sinus rhythm.  Rate 94. Minimal ST elevation, anterior leads.  No significant change.  Event Monitor: 04/2023 Patch Wear Time:  2 days and 3 hours (2024-09-05T10:55:58-0400 to 2024-09-07T14:05:19-0400)  Patient had a min HR of 63 bpm, max HR of 120 bpm, and avg HR of 86 bpm. Predominant underlying rhythm was Sinus Rhythm. Isolated SVEs were rare (<1.0%), SVE Couplets were rare (<1.0%), and SVE Triplets were rare (<1.0%). No Isolated VEs, VE Couplets, or  VE Triplets were present.   Coronary CT: 05/2023 IMPRESSION: 1. Coronary calcium score of 119. This was 63rd percentile for age and sex matched control.  2. Total plaque volume 167mm3 which is 30th percentile for age and sex-matched controls (calcified plaque 48mm3; noncalcified plaque 161mm3). TPV is moderate  3.  Normal coronary origin with right dominance.  4.  Nonobstructive CAD  5. Minimal (0-24%) stenosis in left main, proximal/mid LAD, and  proximal LCX  6.  Dilated aortic root measuring 41mm  CAD-RADS 1. Minimal non-obstructive CAD (0-24%). Consider non-atherosclerotic causes of chest pain. Consider preventive therapy and risk factor modification.  TTE 05/16/2022: 1. Left ventricular ejection fraction, by estimation, is 50 to 55%. The  left ventricle has low normal function. The left  ventricle has no regional  wall motion abnormalities. Left ventricular diastolic parameters are  consistent with Grade I diastolic  dysfunction (impaired relaxation).  2. Right ventricular systolic function is normal. The right ventricular  size is normal. Tricuspid regurgitation signal is inadequate for assessing  PA pressure.  3. The mitral valve is normal in structure. No evidence of mitral valve  regurgitation.  4. The aortic valve is tricuspid. Aortic valve regurgitation is not  visualized. No aortic stenosis is present.  5. Aortic dilatation noted. There is borderline dilatation of the aortic  root, measuring 39 mm.   Comparison(s): Prior images reviewed side by side. The left ventricular  function has improved.     )        Anesthesia Quick Evaluation

## 2023-07-22 NOTE — H&P (Signed)
Mike Floyd is an 62 y.o. male.   Chief Complaint: Dehiscence left below-knee amputation. HPI: Patient is a 62 year old gentleman who is status post left below-knee amputation.  Patient has had progressive wound dehiscence with purulent drainage.  Past Medical History:  Diagnosis Date   AICD (automatic cardioverter/defibrillator) present 11/2007   Removed in 2018, a. 11/2007 SJM Current VR - single lead ICD  - Removed 2018 - "it was burning me"   Anxiety    CAD (coronary artery disease)    non-obstructive CAD by Cor CT in 2020 // Myoview 3/22: EF 57, small inf-sept defect c/w scar, no ischemia; low risk      Chest pain    a. 10/2007 Cath:  normal Cors.   CKD (chronic kidney disease), stage II    DDD (degenerative disc disease), lumbar    Diabetes mellitus DX: 2010   type 2   Erosive esophagitis    a. per EGD (08/2011), Dr. Karilyn Cota - Erosive reflux esophagitis improved but not completely healed since previous EGD 3 years ago. Bx showing  ulcerated gatroesophageal junction mucosa. negative for H. pylori   GERD (gastroesophageal reflux disease)    Gout    Hearing deficit    a. wear bilateral hearing aides   History of hiatal hernia    History of kidney stones    passed stones, no surgery   Hypertension    Mildly dilatd aortic root (HCC)    CMR 4/22: EF 52, no LGE; d/w Dr. Lorretta Harp root 38 mm (mildly dilated)   Myocardial infarction [facility]) 2011   Neuropathy    Feet and legs   Nonischemic dilated cardiomyopathy (HCC)    a. H/O EF as low as 35-40% by LV gram 10/2007;  b. Echo 02/2011 EF 50-55%, inf HK, Gr 1 DD // CMR 4/22: EF 52, no LGE; d/w Dr. Lorretta Harp root 38 mm (mildly dilated)    Renal insufficiency    Sleep apnea    pt doesnt use, states"I cant afford one". PCP aware   Stroke (HCC)    mini-stroke in 2014   TIA (transient ischemic attack)    July, 2013   Tobacco abuse, in remission 06/27/2009   Discontinued in 2009     Wears dentures    top plate   WPW  (Wolff-Parkinson-White syndrome)    a. s/p RFCA @ [facility] - 1999    Past Surgical History:  Procedure Laterality Date   AMPUTATION Left 05/19/2022   Procedure: TRANSMETATARSAL AMPUTATION LEFT FOOT;  Surgeon: Nadara Mustard, MD;  Location: [facility] OR;  Service: Orthopedics;  Laterality: Left;   AMPUTATION Left 06/17/2023   Procedure: LEFT BELOW KNEE AMPUTATION;  Surgeon: Nadara Mustard, MD;  Location: [facility] OR;  Service: Orthopedics;  Laterality: Left;   APPENDECTOMY     BACK SURGERY     1995   CARDIAC CATHETERIZATION     2009   CARDIAC DEFIBRILLATOR PLACEMENT     ICD was removed in 2018   CHOLECYSTECTOMY     CHOLECYSTECTOMY, LAPAROSCOPIC     11/2007   COLONOSCOPY W/ POLYPECTOMY  2009   ELBOW SURGERY Left 06/2010   ESOPHAGEAL DILATION N/A 11/08/2014   Procedure: ESOPHAGEAL DILATION;  Surgeon: Malissa Hippo, MD;  Location: [facility] ORS;  Service: Endoscopy;  Laterality: N/A;  #56,    ESOPHAGOGASTRODUODENOSCOPY  03/31/2012   also 08/2011; [name]   ESOPHAGOGASTRODUODENOSCOPY (EGD) WITH PROPOFOL N/A 11/08/2014   Procedure: ESOPHAGOGASTRODUODENOSCOPY (EGD) WITH PROPOFOL;  Surgeon: Malissa Hippo, MD;  Location: [facility]  ORS;  Service: Endoscopy;  Laterality: N/A;  Hiatus is 42 , GE [place]unction is 37   FOOT ARTHRODESIS Left 10/24/2020   Procedure: LEFT GASTROCNEMIUS RECESSION, DORSIFLEXION OSTEOTOMY 1ST MT;  Surgeon: Nadara Mustard, MD;  Location: [facility] OR;  Service: Orthopedics;  Laterality: Left;   FOOT ARTHRODESIS Right 01/09/2021   Procedure: CLOSING WEDGE OSTEOTOMY RIGHT 1ST METATARSAL;  Surgeon: Nadara Mustard, MD;  Location: [facility] OR;  Service: Orthopedics;  Laterality: Right;   GASTROCNEMIUS RECESSION Right 01/09/2021   Procedure: RIGHT GASTROCNEMIUS RECESSION;  Surgeon: Nadara Mustard, MD;  Location: [facility] OR;  Service: Orthopedics;  Laterality: Right;   ICD LEAD REMOVAL N/A 10/07/2016   Procedure: ICD LEAD REMOVAL ;  Surgeon: Marinus Maw, MD;  Location: [facility] OR;  Service: Cardiovascular;  Laterality: N/A;    MULTIPLE TOOTH EXTRACTIONS     RADIOFREQUENCY ABLATION  1999   WPW; performed at [facility]   TEE WITHOUT CARDIOVERSION N/A 10/07/2016   Procedure: TRANSESOPHAGEAL ECHOCARDIOGRAM (TEE);  Surgeon: Marinus Maw, MD;  Location: [facility] OR;  Service: Cardiovascular;  Laterality: N/A;    Family History  Problem Relation Age of Onset   Heart attack Mother 34   Hypertension Mother    Diabetes Mother    Kidney disease Mother    Breast cancer Mother    Heart attack Father 34   Hypertension Father    Diabetes Father    Stroke Brother    Heart attack Brother 52   Stroke Maternal Grandmother 59   Diabetes Brother    Hypertension Brother    Social History:  reports that he quit smoking about 12 years ago. His smoking use included cigarettes. He started smoking about 52 years ago. He has a 58.6 pack-year smoking history. He quit smokeless tobacco use about 29 years ago.  His smokeless tobacco use included chew. He reports that he does not drink alcohol and does not use drugs.  Allergies: No Known Allergies  No medications prior to admission.    No results found for this or any previous visit (from the past 48 hours). No results found.  Review of Systems  All other systems reviewed and are negative.   There were no vitals taken for this visit. Physical Exam  Patient is alert, oriented, no adenopathy, well-dressed, normal affect, normal respiratory effort. Examination of the left below-knee amputation there is wound dehiscence with purulent drainage.  There is cellulitis around the incision.  No ascending cellulitis.    Previous noninvasive arterial studies showed multiphasic waveforms in both lower extremities with no elevated velocities or stenosis.  Assessment/Plan 1. Below-knee amputation of left lower extremity, initial encounter (HCC)   2. Non-pressure chronic ulcer of other part of right foot limited to breakdown of skin (HCC)   3. Dehiscence of amputation stump of left lower  extremity (HCC)       Plan:  Will plan for revision transtibial amputation tomorrow for the left lower extremity.  Plan for admission with discharge back to home.  Risk and benefits were discussed including the risk for above-knee amputation on the left.  Patient and family state they understand wish to proceed at this time.  Nadara Mustard, MD 07/22/2023, 6:51 AM

## 2023-07-22 NOTE — Progress Notes (Signed)
The patient informed the nurse and Dr. Lajoyce Corners that he fell yesterday afternoon when going into the house. Per the patient he did not injure himself but did have some bleeding at his BKA site. Ok per Dr. Lajoyce Corners.

## 2023-07-22 NOTE — Op Note (Signed)
07/22/2023  12:40 PM  PATIENT:  Mike Floyd    PRE-OPERATIVE DIAGNOSIS:  Dehiscence Left Below Knee Amputation  POST-OPERATIVE DIAGNOSIS:  Same  PROCEDURE:  REVISION LEFT BELOW KNEE AMPUTATION Application Kerecis micro graft 38 cm. Application vancomycin powder 1 g. Application of wound VAC customizable and Merton Border form.  SURGEON:  Nadara Mustard, MD  PHYSICIAN ASSISTANT:None ANESTHESIA:   General  PREOPERATIVE INDICATIONS:  Mike Floyd is a  61 y.o. male with a diagnosis of Dehiscence Left Below Knee Amputation who failed conservative measures and elected for surgical management.    The risks benefits and alternatives were discussed with the patient preoperatively including but not limited to the risks of infection, bleeding, nerve injury, cardiopulmonary complications, the need for revision surgery, among others, and the patient was willing to proceed.  OPERATIVE IMPLANTS:   Implant Name Type Inv. Item Serial No. Manufacturer Lot No. LRB No. Used Action  GRAFT SKIN WND MICRO 38 - [id number] Tissue GRAFT SKIN WND MICRO 38  [employer] [id number] Left 1 Implanted    @ENCIMAGES @  OPERATIVE FINDINGS: No purulent abscess tissue margins were clear.  OPERATIVE PROCEDURE: Patient brought the operating room underwent general anesthetic.  After adequate levels anesthesia obtained patient's left lower extremity was prepped using DuraPrep draped into a sterile field a timeout was called.  A fishmouth incision was made around the ulcerative area of wound dehiscence.  This was carried sharply down to bone.  The distal 3 cm of tibia and fibula were resected.  2-0 silk ties were used for hemostasis.  The wound was irrigated with Vashe.  The wound bed was then filled with 38 cm of Kerecis micro graft and 1 g vancomycin powder for a surface area of 200 cm.  The incision was closed using 2-0 nylon.  The customizable wound VAC and the Merton Border form wound VAC were applied this had a good suction  fit overwrapped with a stump shrinker.  Patient was extubated taken the PACU in stable condition.   DISCHARGE PLANNING:  Antibiotic duration: 24 hours IV antibiotics  Weightbearing: Nonweightbearing on the left  Pain medication: Opioid pathway  Dressing care/ Wound VAC: Wound VAC  Ambulatory devices: Walker  Discharge to: Anticipate discharge to home.  Follow-up: In the office 1 week post operative.

## 2023-07-23 LAB — GLUCOSE, CAPILLARY
Glucose-Capillary: 225 mg/dL — ABNORMAL HIGH (ref 70–99)
Glucose-Capillary: 258 mg/dL — ABNORMAL HIGH (ref 70–99)
Glucose-Capillary: 248 mg/dL — ABNORMAL HIGH (ref 70–99)
Glucose-Capillary: 383 mg/dL — ABNORMAL HIGH (ref 70–99)

## 2023-07-23 NOTE — Evaluation (Signed)
Physical Therapy Evaluation Patient Details Name: Mike Floyd MRN: [id number] DOB: Sep 27, 1960 Today's Date: 07/23/2023  History of Present Illness  Pt is a 62 y/o male admitted to [facility] 07/22/23 for dehiscence of L BKA stump and chronic ulcer of R foot. S/p revision of L BKA 12/13. PMHx: poorly controlled type 2 diabetes, GERD, hypertension, dyslipidemia, CAD, L BKA 06/17/23, and neuropathy.   Clinical Impression  Pt in bed upon arrival and agreeable to PT eval. Prior to admit, pt had wife assist with transfers, gait, stairs and ADLs. Pt reports using RW to ambulate short distances. In today's session, pt was able to ambulate ~4 feet to the recliner with CGA for safety and steadying. Pt reports providing tactile sensation to limb at home and elevated residual limb. Pt is active with HHPT and prefers to return home with current set-up. Pt presents to therapy session with decreased balance, strength, and mobility. Pt would benefit from acute skilled PT to address functional impairments. Acute PT to follow.          If plan is discharge home, recommend the following: A little help with walking and/or transfers;A little help with bathing/dressing/bathroom;Assist for transportation;Help with stairs or ramp for entrance   Can travel by private vehicle    Yes    Equipment Recommendations None recommended by PT  Recommendations for Other Services  OT consult    Functional Status Assessment Patient has had a recent decline in their functional status and demonstrates the ability to make significant improvements in function in a reasonable and predictable amount of time.     Precautions / Restrictions Precautions Precautions: Fall Precaution Comments: L BKA, wound vac Required Braces or Orthoses: Other Brace (L LE limb protector) Restrictions Weight Bearing Restrictions Per Provider Order: Yes LLE Weight Bearing Per Provider Order: Non weight bearing Other Position/Activity Restrictions: w/  Limb protector OOB      Mobility  Bed Mobility Overal bed mobility: Modified Independent     General bed mobility comments: HOB elevated, pt able to don limb protector independently    Transfers Overall transfer level: Needs assistance Equipment used: Rolling walker (2 wheels) Transfers: Sit to/from Stand Sit to Stand: Contact guard assist  General transfer comment: CGA for safety and steadying    Ambulation/Gait Ambulation/Gait assistance: Contact guard assist Gait Distance (Feet): 4 Feet Assistive device: Rolling walker (2 wheels) Gait Pattern/deviations: Step-to pattern Gait velocity: dec     General Gait Details: hop to gait pattern, slightly unsteady w/ CGA to correct balance        Balance Overall balance assessment: Mild deficits observed, not formally tested, Needs assistance Sitting-balance support: No upper extremity supported, Feet supported Sitting balance-Leahy Scale: Fair     Standing balance support: Bilateral upper extremity supported, During functional activity, Reliant on assistive device for balance Standing balance-Leahy Scale: Poor Standing balance comment: reliant on RW        Pertinent Vitals/Pain Pain Assessment Pain Assessment: Faces Faces Pain Scale: Hurts whole lot Pain Location: R LE stump Pain Descriptors / Indicators: Aching, Discomfort Pain Intervention(s): Limited activity within patient's tolerance, Monitored during session, Premedicated before session, Repositioned    Home Living Family/patient expects to be discharged to:: Private residence Living Arrangements: Spouse/significant other Available Help at Discharge: Family;Available 24 hours/day (wife, daugher drives) Type of Home: Mobile home Home Access: Stairs to enter Entrance Stairs-Rails: Right;Can reach both;Left Entrance Stairs-Number of Steps: 3   Home Layout: One level Home Equipment: BSC/3in1;Rolling Walker (2 wheels);Crutches;Cane - single point;Wheelchair -  manual      Prior Function Prior Level of Function : Needs assist       Physical Assist : Mobility (physical);ADLs (physical) Mobility (physical): Transfers;Gait;Stairs ADLs (physical): Bathing;Dressing;Toileting (uses BSC) Mobility Comments: ambulates w/ RW and wife holding onto him ADLs Comments: assist for bathing,dressing     Extremity/Trunk Assessment   Upper Extremity Assessment Upper Extremity Assessment: Defer to OT evaluation    Lower Extremity Assessment Lower Extremity Assessment: Generalized weakness;LLE deficits/detail LLE Deficits / Details: L BKA, reports phantom pain    Cervical / Trunk Assessment Cervical / Trunk Assessment: Normal  Communication   Communication Communication: Hearing impairment Cueing Techniques: Verbal cues  Cognition Arousal: Alert Behavior During Therapy: WFL for tasks assessed/performed Overall Cognitive Status: Within Functional Limits for tasks assessed    General Comments: A&Ox4        General Comments General comments (skin integrity, edema, etc.): VSS on RA, sock donned upon arrival. Pt reports providing tactile stimulation to L BKA stump and elevating limb     PT Assessment Patient needs continued PT services  PT Problem List Decreased strength;Decreased range of motion;Decreased activity tolerance;Decreased balance;Decreased mobility       PT Treatment Interventions DME instruction;Stair training;Gait training;Functional mobility training;Therapeutic exercise;Therapeutic activities;Balance training;Neuromuscular re-education;Patient/family education    PT Goals (Current goals can be found in the Care Plan section)  Acute Rehab PT Goals Patient Stated Goal: to go home PT Goal Formulation: With patient Time For Goal Achievement: 08/06/23 Potential to Achieve Goals: Good    Frequency Min 1X/week        AM-PAC PT "6 Clicks" Mobility  Outcome Measure Help needed turning from your back to your side while in a  flat bed without using bedrails?: None Help needed moving from lying on your back to sitting on the side of a flat bed without using bedrails?: A Little Help needed moving to and from a bed to a chair (including a wheelchair)?: A Little Help needed standing up from a chair using your arms (e.g., wheelchair or bedside chair)?: A Little Help needed to walk in hospital room?: A Little Help needed climbing 3-5 steps with a railing? : A Lot 6 Click Score: 18    End of Session Equipment Utilized During Treatment: Gait belt;Other (comment) (L stump protector) Activity Tolerance: Patient limited by pain Patient left: in chair;with call bell/phone within reach;with chair alarm set Nurse Communication: Mobility status PT Visit Diagnosis: Unsteadiness on feet (R26.81);Muscle weakness (generalized) (M62.81)    Time: 4098-1191 PT Time Calculation (min) (ACUTE ONLY): 22 min   Charges:   PT Evaluation $PT Eval Low Complexity: 1 Low   PT General Charges $$ ACUTE PT VISIT: 1 Visit         Hilton Cork, PT, DPT Secure Chat Preferred  Rehab Office [phone]  Arturo Morton Brion Aliment 07/23/2023, 10:26 AM

## 2023-07-23 NOTE — Progress Notes (Signed)
Patient ID: Mike Floyd, male   DOB: July 14, 1961, 63 y.o.   MRN: [id number] Patient is postoperative day 1 revision transtibial amputation.  There is no drainage of the wound VAC canister there is a good suction fit.  Anticipate discharge to home when safe with therapy.  Most likely discharge on Monday.

## 2023-07-23 NOTE — Plan of Care (Signed)
  Problem: Education: Goal: Knowledge of General Education information will improve Description: Including pain rating scale, medication(s)/side effects and non-pharmacologic comfort measures Outcome: Progressing   Problem: Health Behavior/Discharge Planning: Goal: Ability to manage health-related needs will improve Outcome: Progressing   Problem: Clinical Measurements: Goal: Ability to maintain clinical measurements within normal limits will improve Outcome: Progressing Goal: Will remain free from infection Outcome: Progressing Goal: Diagnostic test results will improve Outcome: Progressing Goal: Respiratory complications will improve Outcome: Progressing Goal: Cardiovascular complication will be avoided Outcome: Progressing   Problem: Activity: Goal: Risk for activity intolerance will decrease Outcome: Progressing   Problem: Nutrition: Goal: Adequate nutrition will be maintained Outcome: Progressing   Problem: Coping: Goal: Level of anxiety will decrease Outcome: Progressing   Problem: Elimination: Goal: Will not experience complications related to bowel motility Outcome: Progressing Goal: Will not experience complications related to urinary retention Outcome: Progressing   Problem: Pain Management: Goal: General experience of comfort will improve Outcome: Progressing   Problem: Safety: Goal: Ability to remain free from injury will improve Outcome: Progressing   Problem: Skin Integrity: Goal: Risk for impaired skin integrity will decrease Outcome: Progressing   Problem: Education: Goal: Ability to describe self-care measures that may prevent or decrease complications (Diabetes Survival Skills Education) will improve Outcome: Progressing Goal: Individualized Educational Video(s) Outcome: Progressing   Problem: Coping: Goal: Ability to adjust to condition or change in health will improve Outcome: Progressing   Problem: Fluid Volume: Goal: Ability to  maintain a balanced intake and output will improve Outcome: Progressing   Problem: Health Behavior/Discharge Planning: Goal: Ability to identify and utilize available resources and services will improve Outcome: Progressing Goal: Ability to manage health-related needs will improve Outcome: Progressing   Problem: Metabolic: Goal: Ability to maintain appropriate glucose levels will improve Outcome: Progressing   Problem: Nutritional: Goal: Maintenance of adequate nutrition will improve Outcome: Progressing Goal: Progress toward achieving an optimal weight will improve Outcome: Progressing   Problem: Skin Integrity: Goal: Risk for impaired skin integrity will decrease Outcome: Progressing   Problem: Tissue Perfusion: Goal: Adequacy of tissue perfusion will improve Outcome: Progressing   Problem: Education: Goal: Knowledge of the prescribed therapeutic regimen will improve Outcome: Progressing Goal: Ability to verbalize activity precautions or restrictions will improve Outcome: Progressing Goal: Understanding of discharge needs will improve Outcome: Progressing   Problem: Activity: Goal: Ability to perform//tolerate increased activity and mobilize with assistive devices will improve Outcome: Progressing   Problem: Clinical Measurements: Goal: Postoperative complications will be avoided or minimized Outcome: Progressing   Problem: Self-Care: Goal: Ability to meet self-care needs will improve Outcome: Progressing   Problem: Self-Concept: Goal: Ability to maintain and perform role responsibilities to the fullest extent possible will improve Outcome: Progressing   Problem: Pain Management: Goal: Pain level will decrease with appropriate interventions Outcome: Progressing   Problem: Skin Integrity: Goal: Demonstration of wound healing without infection will improve Outcome: Progressing

## 2023-07-24 ENCOUNTER — Encounter (HOSPITAL_COMMUNITY): Payer: Self-pay | Admitting: Orthopedic Surgery

## 2023-07-24 LAB — GLUCOSE, CAPILLARY
Glucose-Capillary: 229 mg/dL — ABNORMAL HIGH (ref 70–99)
Glucose-Capillary: 235 mg/dL — ABNORMAL HIGH (ref 70–99)
Glucose-Capillary: 185 mg/dL — ABNORMAL HIGH (ref 70–99)
Glucose-Capillary: 162 mg/dL — ABNORMAL HIGH (ref 70–99)

## 2023-07-24 NOTE — Plan of Care (Signed)
  Problem: Education: Goal: Knowledge of General Education information will improve Description: Including pain rating scale, medication(s)/side effects and non-pharmacologic comfort measures Outcome: Progressing   Problem: Health Behavior/Discharge Planning: Goal: Ability to manage health-related needs will improve Outcome: Progressing   Problem: Clinical Measurements: Goal: Ability to maintain clinical measurements within normal limits will improve Outcome: Progressing Goal: Will remain free from infection Outcome: Progressing Goal: Diagnostic test results will improve Outcome: Progressing Goal: Respiratory complications will improve Outcome: Progressing Goal: Cardiovascular complication will be avoided Outcome: Progressing   Problem: Activity: Goal: Risk for activity intolerance will decrease Outcome: Progressing   Problem: Nutrition: Goal: Adequate nutrition will be maintained Outcome: Progressing   Problem: Coping: Goal: Level of anxiety will decrease Outcome: Progressing   Problem: Elimination: Goal: Will not experience complications related to bowel motility Outcome: Progressing Goal: Will not experience complications related to urinary retention Outcome: Progressing   Problem: Pain Management: Goal: General experience of comfort will improve Outcome: Progressing   Problem: Safety: Goal: Ability to remain free from injury will improve Outcome: Progressing   Problem: Skin Integrity: Goal: Risk for impaired skin integrity will decrease Outcome: Progressing   Problem: Education: Goal: Ability to describe self-care measures that may prevent or decrease complications (Diabetes Survival Skills Education) will improve Outcome: Progressing Goal: Individualized Educational Video(s) Outcome: Progressing   Problem: Coping: Goal: Ability to adjust to condition or change in health will improve Outcome: Progressing   Problem: Fluid Volume: Goal: Ability to  maintain a balanced intake and output will improve Outcome: Progressing   Problem: Health Behavior/Discharge Planning: Goal: Ability to identify and utilize available resources and services will improve Outcome: Progressing Goal: Ability to manage health-related needs will improve Outcome: Progressing   Problem: Metabolic: Goal: Ability to maintain appropriate glucose levels will improve Outcome: Progressing   Problem: Nutritional: Goal: Maintenance of adequate nutrition will improve Outcome: Progressing Goal: Progress toward achieving an optimal weight will improve Outcome: Progressing   Problem: Skin Integrity: Goal: Risk for impaired skin integrity will decrease Outcome: Progressing   Problem: Tissue Perfusion: Goal: Adequacy of tissue perfusion will improve Outcome: Progressing   Problem: Education: Goal: Knowledge of the prescribed therapeutic regimen will improve Outcome: Progressing Goal: Ability to verbalize activity precautions or restrictions will improve Outcome: Progressing Goal: Understanding of discharge needs will improve Outcome: Progressing   Problem: Activity: Goal: Ability to perform//tolerate increased activity and mobilize with assistive devices will improve Outcome: Progressing   Problem: Clinical Measurements: Goal: Postoperative complications will be avoided or minimized Outcome: Progressing   Problem: Self-Care: Goal: Ability to meet self-care needs will improve Outcome: Progressing   Problem: Self-Concept: Goal: Ability to maintain and perform role responsibilities to the fullest extent possible will improve Outcome: Progressing   Problem: Pain Management: Goal: Pain level will decrease with appropriate interventions Outcome: Progressing   Problem: Skin Integrity: Goal: Demonstration of wound healing without infection will improve Outcome: Progressing

## 2023-07-24 NOTE — Evaluation (Signed)
Occupational Therapy Evaluation Patient Details Name: Mike Floyd MRN: [id number] DOB: 06-Feb-1961 Today's Date: 07/24/2023   History of Present Illness Pt is a 62 y/o male admitted to [facility] 07/22/23 for dehiscence of L BKA stump and chronic ulcer of R foot. S/p revision of L BKA 12/13. PMHx: poorly controlled type 2 diabetes, GERD, hypertension, dyslipidemia, CAD, L BKA 06/17/23, and neuropathy.   Clinical Impression   Patient evaluated by Occupational Therapy with no further acute OT needs identified. All education has been completed and the patient has no further questions. See below for any follow-up Occupational Therapy or equipment needs. OT to sign off. Thank you for referral.         If plan is discharge home, recommend the following:      Functional Status Assessment  Patient has had a recent decline in their functional status and demonstrates the ability to make significant improvements in function in a reasonable and predictable amount of time.  Equipment Recommendations  None recommended by OT    Recommendations for Other Services       Precautions / Restrictions Precautions Precautions: Fall Precaution Comments: L BKA, wound vac Required Braces or Orthoses: Other Brace Other Brace: limb protector Restrictions Weight Bearing Restrictions Per Provider Order: Yes LLE Weight Bearing Per Provider Order: Non weight bearing      Mobility Bed Mobility Overal bed mobility: Independent                  Transfers Overall transfer level: Modified independent Equipment used: Rolling walker (2 wheels)               General transfer comment: pt even able to get off low surface couch in room with L UE arm rest only      Balance                                           ADL either performed or assessed with clinical judgement   ADL Overall ADL's : At baseline                                       General ADL  Comments: pt educated on amputee support group and given handout. pt able to don doff limb protector. pt demonstrate transfer and LB dressing do don doff shoe     Vision Baseline Vision/History: 1 Wears glasses Ability to See in Adequate Light: 0 Adequate Patient Visual Report: No change from baseline       Perception         Praxis         Pertinent Vitals/Pain Pain Assessment Pain Assessment: No/denies pain     Extremity/Trunk Assessment Upper Extremity Assessment Upper Extremity Assessment: Overall WFL for tasks assessed   Lower Extremity Assessment Lower Extremity Assessment: Defer to PT evaluation   Cervical / Trunk Assessment Cervical / Trunk Assessment: Normal   Communication Communication Communication: Hearing impairment   Cognition Arousal: Alert Behavior During Therapy: WFL for tasks assessed/performed Overall Cognitive Status: Within Functional Limits for tasks assessed                                       General Comments  VSS  on RA - wound vac and dressing appear intact at this time    Exercises     Shoulder Instructions      Home Living Family/patient expects to be discharged to:: Private residence Living Arrangements: Spouse/significant other Available Help at Discharge: Family;Available 24 hours/day (wife, daugher drives) Type of Home: Mobile home Home Access: Stairs to enter [employer] of Steps: 3 Entrance Stairs-Rails: Right;Can reach both;Left Home Layout: One level     Bathroom Shower/Tub: Producer, television/film/video: Standard Bathroom Accessibility: No   Home Equipment: Teacher, English as a foreign language (2 wheels);Crutches;Cane - single point;Wheelchair - manual   Additional Comments: wife is home at all time but helps with mother that has dementia. wife does not drive. daughter does all the necessary driving      Prior Functioning/Environment Prior Level of Function : Needs assist       Physical  Assist : Mobility (physical);ADLs (physical) Mobility (physical): Transfers;Gait;Stairs ADLs (physical): Bathing;Dressing;Toileting Mobility Comments: ambulates w/ RW and wife holding onto him ADLs Comments: assist for bathing,dressing        OT Problem List:        OT Treatment/Interventions:      OT Goals(Current goals can be found in the care plan section) Acute Rehab OT Goals Potential to Achieve Goals: Good  OT Frequency:      Co-evaluation              AM-PAC OT "6 Clicks" Daily Activity     Outcome Measure Help from another person eating meals?: None Help from another person taking care of personal grooming?: None Help from another person toileting, which includes using toliet, bedpan, or urinal?: None Help from another person bathing (including washing, rinsing, drying)?: None Help from another person to put on and taking off regular upper body clothing?: None Help from another person to put on and taking off regular lower body clothing?: None 6 Click Score: 24   End of Session Equipment Utilized During Treatment: Gait belt;Rolling walker (2 wheels) Nurse Communication: Mobility status;Precautions  Activity Tolerance: Patient tolerated treatment well Patient left: in bed;with call bell/phone within reach  OT Visit Diagnosis: Unsteadiness on feet (R26.81)                Time: 9604-5409 OT Time Calculation (min): 23 min Charges:  OT General Charges $OT Visit: 1 Visit OT Evaluation $OT Eval Low Complexity: 1 Low   Brynn, OTR/L  Acute Rehabilitation Services Office: [phone] .   [name] 07/24/2023, 3:39 PM

## 2023-07-24 NOTE — Progress Notes (Signed)
Patient ID: Mike Floyd, male   DOB: 27-Jan-1961, 62 y.o.   MRN: [id number] Patient is postoperative day 2 revision transtibial amputation.  There is no drainage of the wound VAC canister there is a good suction fit.  Will continue to mobilize with physical therapy.  Overall he is doing quite well and is progressing nicely.  Likely plan for discharge tomorrow

## 2023-07-25 LAB — GLUCOSE, CAPILLARY: Glucose-Capillary: 156 mg/dL — ABNORMAL HIGH (ref 70–99)

## 2023-07-25 MED ORDER — OXYCODONE-ACETAMINOPHEN 5-325 MG PO TABS: 1.0000 | ORAL_TABLET | ORAL | 0 refills | Status: DC | PRN

## 2023-07-25 NOTE — TOC Transition Note (Signed)
Transition of Care [facility]) - Discharge Note   Patient Details  Name: Mike Floyd MRN: [id number] Date of Birth: June 09, 1961  Transition of [facility]) CM/SW Contact:  Epifanio Lesches, RN Phone Number: 07/25/2023, 10:22 AM   Clinical Narrative:    Patient will DC to: home Anticipated DC date: 07/25/2023 Family notified: yes Transport by: car       - S/P  REVISION LEFT BELOW KNEE AMPUTATION, 12/13  Per MD patient ready for DC today. RN, patient, patient's wife notified of DC. Amy with [facility] made aware of d/c plan. Pt without DME needs. Daughter to provide transportation to home. Pt without RX med concerns. Post hospital f/u noted on AVS.    RNCM will sign off for now as intervention is no longer needed. Please consult [place] again if new needs arise.   Final next level of care: Home w Home Health Services Barriers to Discharge: No Barriers Identified   Patient Goals and CMS Choice     Choice offered to / list presented to : Patient      Discharge Placement                       Discharge Plan and Services Additional resources added to the After Visit Summary for                            [facility] Arranged: PT, OT [facility] Agency: [facility] Date [facility] Agency Contacted: 07/25/23 Time HH Agency Contacted: 1022 Representative spoke with at [facility] Agency: Amy  Social Drivers of Health (SDOH) Interventions SDOH Screenings   Food Insecurity: No Food Insecurity (07/22/2023)  Housing: Low Risk  (07/22/2023)  Transportation Needs: No Transportation Needs (07/22/2023)  Utilities: Not At Risk (07/22/2023)  Tobacco Use: Medium Risk (07/22/2023)     Readmission Risk Interventions    05/20/2022    4:09 PM  Readmission Risk Prevention Plan  Post Dischage Appt Complete  Medication Screening Complete  Transportation Screening Complete

## 2023-07-25 NOTE — Progress Notes (Signed)
Patient ID: Mike Floyd, male   DOB: [birth year], 62 y.o.   MRN: [id number] Patient is status post revision transtibial amputation.  Patient states he feels well and is ready for discharge to home today.  There is no drainage in the wound VAC canister with a good suction fit.  Plan for discharge with the Prevena plus portable wound VAC pump.

## 2023-07-25 NOTE — Anesthesia Postprocedure Evaluation (Signed)
Anesthesia Post Note  Patient: Mike Floyd  Procedure(s) Performed: REVISION LEFT BELOW KNEE AMPUTATION (Left)     Patient location during evaluation: PACU Anesthesia Type: General Level of consciousness: awake and alert Pain management: pain level controlled Vital Signs Assessment: post-procedure vital signs reviewed and stable Respiratory status: spontaneous breathing, nonlabored ventilation, respiratory function stable and patient connected to nasal cannula oxygen Cardiovascular status: blood pressure returned to baseline and stable Postop Assessment: no apparent nausea or vomiting Anesthetic complications: no   No notable events documented.  Last Vitals:  Vitals:   07/25/23 0457 07/25/23 0715  BP: 102/68 95/68  Pulse: 89 84  Resp: 18 18  Temp: 36.9 C 36.7 C  SpO2: 98% 98%    Last Pain:  Vitals:   07/25/23 0925  TempSrc:   PainSc: 3                  [name] S

## 2023-07-25 NOTE — Discharge Summary (Signed)
Physician Discharge Summary  Patient ID: Mike Floyd MRN: [id number] DOB/AGE: [birth year] 62 y.o.  Admit date: 07/22/2023 Discharge date: 07/25/2023  Admission Diagnoses:  Principal Problem:   Dehiscence of amputation stump of left lower extremity (HCC) Active Problems:   Ischemia of site of left below knee amputation (HCC)   S/P BKA (below knee amputation) unilateral, left [facility])   Discharge Diagnoses:  Same  Past Medical History:  Diagnosis Date   AICD (automatic cardioverter/defibrillator) present 11/2007   Removed in 2018, a. 11/2007 SJM Current VR - single lead ICD  - Removed 2018 - "it was burning me"   Anxiety    CAD (coronary artery disease)    non-obstructive CAD by Cor CT in 2020 // Myoview 3/22: EF 57, small inf-sept defect c/w scar, no ischemia; low risk      Chest pain    a. 10/2007 Cath:  normal Cors.   CKD (chronic kidney disease), stage II    DDD (degenerative disc disease), lumbar    Diabetes mellitus DX: 2010   type 2   Erosive esophagitis    a. per EGD (08/2011), Dr. Karilyn Cota - Erosive reflux esophagitis improved but not completely healed since previous EGD 3 years ago. Bx showing  ulcerated gatroesophageal junction mucosa. negative for H. pylori   GERD (gastroesophageal reflux disease)    Gout    Hearing deficit    a. wear bilateral hearing aides   History of hiatal hernia    History of kidney stones    passed stones, no surgery   Hypertension    Mildly dilatd aortic root (HCC)    CMR 4/22: EF 52, no LGE; d/w Dr. Lorretta Harp root 38 mm (mildly dilated)   Myocardial infarction [facility]) 2011   Neuropathy    Feet and legs   Nonischemic dilated cardiomyopathy (HCC)    a. H/O EF as low as 35-40% by LV gram 10/2007;  b. Echo 02/2011 EF 50-55%, inf HK, Gr 1 DD // CMR 4/22: EF 52, no LGE; d/w Dr. Lorretta Harp root 38 mm (mildly dilated)    Renal insufficiency    Sleep apnea    pt doesnt use, states"I cant afford one". PCP aware   Stroke (HCC)    mini-stroke in  2014   TIA (transient ischemic attack)    July, 2013   Tobacco abuse, in remission 06/27/2009   Discontinued in 2009     Wears dentures    top plate   WPW (Wolff-Parkinson-White syndrome)    a. s/p RFCA @ [facility] - 1999    Surgeries: Procedure(s): REVISION LEFT BELOW KNEE AMPUTATION on 07/22/2023   Consultants:   Discharged Condition: Improved  Hospital Course: Mike Floyd is an 62 y.o. male who was admitted 07/22/2023 with a chief complaint of No chief complaint on file. , and found to have a diagnosis of Dehiscence of amputation stump of left lower extremity (HCC).  They were brought to the operating room on 07/22/2023 and underwent the above named procedures.    They were given perioperative antibiotics:  Anti-infectives (From admission, onward)    Start     Dose/Rate Route Frequency Ordered Stop   07/22/23 2000  ceFAZolin (ANCEF) IVPB 2g/100 mL premix        2 g 200 mL/hr over 30 Minutes Intravenous Every 8 hours 07/22/23 1358 07/23/23 0332   07/22/23 1144  vancomycin (VANCOCIN) powder  Status:  Discontinued          As needed 07/22/23 1144 07/22/23 1206  07/22/23 0945  ceFAZolin (ANCEF) IVPB 2g/100 mL premix        2 g 200 mL/hr over 30 Minutes Intravenous On call to O.R. 07/22/23 0939 07/22/23 1128   07/22/23 0937  ceFAZolin (ANCEF) 2-4 GM/100ML-% IVPB       Note to Pharmacy: Amanda Pea M: cabinet override      07/22/23 0937 07/22/23 1136     .  They were given compression stockings, early ambulation, and chemoprophylaxis for DVT prophylaxis.  They benefited maximally from their hospital stay and there were no complications.    Recent vital signs:  Vitals:   07/25/23 0457 07/25/23 0715  BP: 102/68 95/68  Pulse: 89 84  Resp: 18 18  Temp: 98.5 F (36.9 C) 98 F (36.7 C)  SpO2: 98% 98%    Recent laboratory studies:  Results for orders placed or performed during the hospital encounter of 07/22/23  Glucose, capillary   Collection Time: 07/22/23  9:14  AM  Result Value Ref Range   Glucose-Capillary 145 (H) 70 - 99 mg/dL  CBC per protocol   Collection Time: 07/22/23  9:33 AM  Result Value Ref Range   WBC 8.4 4.0 - 10.5 K/uL   RBC 4.88 4.22 - 5.81 MIL/uL   Hemoglobin 14.0 13.0 - 17.0 g/dL   HCT 78.4 69.6 - 29.5 %   MCV 83.6 80.0 - 100.0 fL   MCH 28.7 26.0 - 34.0 pg   MCHC 34.3 30.0 - 36.0 g/dL   RDW 28.4 13.2 - 44.0 %   Platelets 164 150 - 400 K/uL   nRBC 0.0 0.0 - 0.2 %  Basic metabolic panel per protocol   Collection Time: 07/22/23  9:33 AM  Result Value Ref Range   Sodium 135 135 - 145 mmol/L   Potassium 3.6 3.5 - 5.1 mmol/L   Chloride 100 98 - 111 mmol/L   CO2 24 22 - 32 mmol/L   Glucose, Bld 126 (H) 70 - 99 mg/dL   BUN 8 8 - 23 mg/dL   Creatinine, Ser 1.02 0.61 - 1.24 mg/dL   Calcium 9.2 8.9 - 72.5 mg/dL   GFR, Estimated >36 >64 mL/min   Anion gap 11 5 - 15  Glucose, capillary   Collection Time: 07/22/23 12:13 PM  Result Value Ref Range   Glucose-Capillary 141 (H) 70 - 99 mg/dL  Glucose, capillary   Collection Time: 07/22/23  4:09 PM  Result Value Ref Range   Glucose-Capillary 187 (H) 70 - 99 mg/dL  Glucose, capillary   Collection Time: 07/22/23  9:36 PM  Result Value Ref Range   Glucose-Capillary 272 (H) 70 - 99 mg/dL  Glucose, capillary   Collection Time: 07/23/23  8:41 AM  Result Value Ref Range   Glucose-Capillary 225 (H) 70 - 99 mg/dL  Glucose, capillary   Collection Time: 07/23/23 11:30 AM  Result Value Ref Range   Glucose-Capillary 258 (H) 70 - 99 mg/dL  Glucose, capillary   Collection Time: 07/23/23  5:15 PM  Result Value Ref Range   Glucose-Capillary 248 (H) 70 - 99 mg/dL  Glucose, capillary   Collection Time: 07/23/23  7:45 PM  Result Value Ref Range   Glucose-Capillary 383 (H) 70 - 99 mg/dL  Glucose, capillary   Collection Time: 07/24/23  7:48 AM  Result Value Ref Range   Glucose-Capillary 229 (H) 70 - 99 mg/dL  Glucose, capillary   Collection Time: 07/24/23 11:15 AM  Result Value Ref  Range   Glucose-Capillary 235 (H) 70 -  99 mg/dL  Glucose, capillary   Collection Time: 07/24/23  4:48 PM  Result Value Ref Range   Glucose-Capillary 185 (H) 70 - 99 mg/dL  Glucose, capillary   Collection Time: 07/24/23  8:37 PM  Result Value Ref Range   Glucose-Capillary 162 (H) 70 - 99 mg/dL  Glucose, capillary   Collection Time: 07/25/23  7:14 AM  Result Value Ref Range   Glucose-Capillary 156 (H) 70 - 99 mg/dL    Discharge Medications:   Allergies as of 07/25/2023   No Known Allergies      Medication List     STOP taking these medications    doxycycline 100 MG tablet Commonly known as: VIBRA-TABS       TAKE these medications    aspirin 81 MG tablet Take 81 mg by mouth daily.   atorvastatin 20 MG tablet Commonly known as: LIPITOR Take 20 mg by mouth in the morning.   carvedilol 12.5 MG tablet Commonly known as: COREG TAKE 1 TABLET BY MOUTH TWICE DAILY.   cyanocobalamin 1000 MCG tablet Commonly known as: VITAMIN B12 Take 1,000 mcg by mouth daily.   famotidine 20 MG tablet Commonly known as: PEPCID Take 20 mg by mouth daily.   furosemide 20 MG tablet Commonly known as: Lasix Take 1 tablet (20 mg total) by mouth daily.   gabapentin 600 MG tablet Commonly known as: NEURONTIN Take 600 mg by mouth 3 (three) times daily.   insulin glargine 100 UNIT/ML injection Commonly known as: LANTUS Inject 20 Units into the skin at bedtime.   isosorbide mononitrate 30 MG 24 hr tablet Commonly known as: IMDUR Take 1 tablet (30 mg total) by mouth daily.   nitroGLYCERIN 0.4 MG SL tablet Commonly known as: NITROSTAT Place 1 tablet (0.4 mg total) under the tongue every 5 (five) minutes as needed for chest pain.   NovoLOG Mix 70/30 FlexPen (70-30) 100 UNIT/ML FlexPen Generic drug: insulin aspart protamine - aspart Inject 52 Units into the skin 2 (two) times daily.   oxyCODONE-acetaminophen 5-325 MG tablet Commonly known as: PERCOCET/ROXICET Take 1 tablet by  mouth every 4 (four) hours as needed. What changed:  when to take this reasons to take this   pantoprazole 40 MG tablet Commonly known as: PROTONIX Take 40 mg by mouth daily.   potassium chloride SA 20 MEQ tablet Commonly known as: KLOR-CON M TAKE (1) TABLET BY MOUTH ONCE DAILY.   sucralfate 1 g tablet Commonly known as: Carafate Take 1 tablet (1 g total) by mouth with breakfast, with lunch, and with evening meal for 7 days.   tamsulosin 0.4 MG Caps capsule Commonly known as: FLOMAX Take 0.4 mg by mouth daily. am   zolpidem 10 MG tablet Commonly known as: AMBIEN Take 10 mg by mouth at bedtime.        Diagnostic Studies: No results found.  Disposition: Discharge disposition: 01-Home or Self Care       Discharge Instructions     Ambulatory Referral for Lung Cancer Scre   Complete by: As directed    Call MD / Call 911   Complete by: As directed    If you experience chest pain or shortness of breath, CALL 911 and be transported to the hospital emergency room.  If you develope a fever above 101 F, pus (white drainage) or increased drainage or redness at the wound, or calf pain, call your surgeon's office.   Constipation Prevention   Complete by: As directed    Drink plenty of  fluids.  Prune juice may be helpful.  You may use a stool softener, such as Colace (over the counter) 100 mg twice a day.  Use MiraLax (over the counter) for constipation as needed.   Diet - low sodium heart healthy   Complete by: As directed    Increase activity slowly as tolerated   Complete by: As directed    Negative Pressure Wound Therapy - Incisional   Complete by: As directed    Attached wound VAC dressing to the Prevena plus portable wound VAC pump.   Post-operative opioid taper instructions:   Complete by: As directed    POST-OPERATIVE OPIOID TAPER INSTRUCTIONS: It is important to wean off of your opioid medication as soon as possible. If you do not need pain medication after your  surgery it is ok to stop day one. Opioids include: Codeine, Hydrocodone(Norco, Vicodin), Oxycodone(Percocet, oxycontin) and hydromorphone amongst others.  Long term and even short term use of opiods can cause: Increased pain response Dependence Constipation Depression Respiratory depression And more.  Withdrawal symptoms can include Flu like symptoms Nausea, vomiting And more Techniques to manage these symptoms Hydrate well Eat regular healthy meals Stay active Use relaxation techniques(deep breathing, meditating, yoga) Do Not substitute Alcohol to help with tapering If you have been on opioids for less than two weeks and do not have pain than it is ok to stop all together.  Plan to wean off of opioids This plan should start within one week post op of your joint replacement. Maintain the same interval or time between taking each dose and first decrease the dose.  Cut the total daily intake of opioids by one tablet each day Next start to increase the time between doses. The last dose that should be eliminated is the evening dose.           Follow-up Information     Nadara Mustard, MD Follow up in 1 week(s).   Specialty: Orthopedic Surgery Contact information: [address] [place] [address] [phone]                  Signed: Nadara Mustard 07/25/2023, 7:23 AM

## 2023-07-29 ENCOUNTER — Encounter: Payer: Self-pay | Admitting: Family

## 2023-07-29 ENCOUNTER — Ambulatory Visit (INDEPENDENT_AMBULATORY_CARE_PROVIDER_SITE_OTHER): Payer: Medicare Other | Admitting: Family

## 2023-07-29 DIAGNOSIS — G4730 Sleep apnea, unspecified: Secondary | ICD-10-CM | POA: Diagnosis not present

## 2023-07-29 DIAGNOSIS — I2510 Atherosclerotic heart disease of native coronary artery without angina pectoris: Secondary | ICD-10-CM | POA: Diagnosis not present

## 2023-07-29 DIAGNOSIS — I5020 Unspecified systolic (congestive) heart failure: Secondary | ICD-10-CM | POA: Diagnosis not present

## 2023-07-29 DIAGNOSIS — N182 Chronic kidney disease, stage 2 (mild): Secondary | ICD-10-CM | POA: Diagnosis not present

## 2023-07-29 DIAGNOSIS — T8781 Dehiscence of amputation stump: Secondary | ICD-10-CM

## 2023-07-29 DIAGNOSIS — E1140 Type 2 diabetes mellitus with diabetic neuropathy, unspecified: Secondary | ICD-10-CM | POA: Diagnosis not present

## 2023-07-29 DIAGNOSIS — E1122 Type 2 diabetes mellitus with diabetic chronic kidney disease: Secondary | ICD-10-CM | POA: Diagnosis not present

## 2023-07-29 DIAGNOSIS — I130 Hypertensive heart and chronic kidney disease with heart failure and stage 1 through stage 4 chronic kidney disease, or unspecified chronic kidney disease: Secondary | ICD-10-CM | POA: Diagnosis not present

## 2023-07-29 DIAGNOSIS — Z89512 Acquired absence of left leg below knee: Secondary | ICD-10-CM

## 2023-07-29 DIAGNOSIS — M86172 Other acute osteomyelitis, left ankle and foot: Secondary | ICD-10-CM | POA: Diagnosis not present

## 2023-07-29 DIAGNOSIS — I428 Other cardiomyopathies: Secondary | ICD-10-CM | POA: Diagnosis not present

## 2023-07-29 NOTE — Progress Notes (Signed)
Post-Op Visit Note   Patient: Mike Floyd           Date of Birth: 1960-09-18           MRN: [id number] Visit Date: 07/29/2023 PCP: Elfredia Nevins, MD  Chief Complaint:  Chief Complaint  Patient presents with   Left Leg - Routine Post Op    07/22/23 revision left BKA    HPI:  HPI The patient is a 62 year old gentleman seen status post revision left below-knee amputation last Friday wound VAC removed today. Ortho Exam Incision well-approximated sutures there is no gaping scant bloody drainage no erythema  Visit Diagnoses: No diagnosis found.  Plan: Begin daily dose of cleansing.  Dry dressings.  Follow-up in 2 weeks.  Follow-Up Instructions: Return in about 17 days (around 08/15/2023).   Imaging: No results found.  Orders:  No orders of the defined types were placed in this encounter.  No orders of the defined types were placed in this encounter.    PMFS History: Patient Active Problem List   Diagnosis Date Noted   Dehiscence of amputation stump of left lower extremity (HCC) 07/22/2023   Ischemia of site of left below knee amputation (HCC) 07/22/2023   S/P BKA (below knee amputation) unilateral, left (HCC) 07/22/2023   Below-knee amputation of left lower extremity (HCC) 06/17/2023   Acute osteomyelitis of metatarsal bone of left foot (HCC)    Subacute osteomyelitis, left ankle and foot (HCC) 05/13/2022   Non-pressure chronic ulcer of other part of right foot limited to breakdown of skin (HCC)    Contracture of right Achilles tendon    Mildly dilatd aortic root (HCC)    Contracture of left Achilles tendon    Metatarsal deformity, left    CAD (coronary artery disease)    Hallux hammertoe, left 12/11/2019   Diarrhea 06/27/2019   Diastasis recti 08/22/2018   Umbilical hernia without obstruction and without gangrene 08/22/2018   Vitamin D deficiency 06/23/2017   Essential hypertension, benign 06/15/2017   Mixed hyperlipidemia 06/15/2017   Class 1 obesity due  to excess calories with serious comorbidity and body mass index (BMI) of 31.0 to 31.9 in adult 06/15/2017   Malfunction of implantable cardioverter-defibrillator (ICD) electrode 10/07/2016   Chronic kidney disease (CKD), stage III (moderate) (HCC) 04/04/2015   Bladder neck obstruction 03/21/2015   Difficult or painful urination 03/21/2015   Flank pain 03/21/2015   Delayed onset of urination 03/21/2015   Calculus of kidney 03/21/2015   Erosive esophagitis 12/27/2013   Hypotension due to drugs 07/02/2013   Chronic systolic heart failure (HCC) 04/24/2013   CAP (community acquired pneumonia) 03/16/2013   HTN (hypertension) 03/15/2013   Hydrocele 03/15/2013   Spermatocele 03/15/2013   DOE (dyspnea on exertion) 03/15/2013   OSA (obstructive sleep apnea) 01/09/2013   Chronic kidney disease, stage 3 (HCC) 01/03/2013   Chest pain 12/26/2012   History of diagnostic tests 09/06/2012   TIA (transient ischemic attack)    Cardiomyopathy, nonischemic (HCC) 02/24/2010   AICD (automatic cardioverter/defibrillator) 02/24/2010   DM type 2 causing vascular disease (HCC) 06/27/2009   Gout 06/27/2009   Tobacco abuse, in remission 06/27/2009   COLONIC POLYPS 10/31/2008   GERD (gastroesophageal reflux disease) 10/31/2008   Past Medical History:  Diagnosis Date   AICD (automatic cardioverter/defibrillator) present 11/2007   Removed in 2018, a. 11/2007 SJM Current VR - single lead ICD  - Removed 2018 - "it was burning me"   Anxiety    CAD (coronary artery disease)  non-obstructive CAD by Cor CT in 2020 // Myoview 3/22: EF 57, small inf-sept defect c/w scar, no ischemia; low risk      Chest pain    a. 10/2007 Cath:  normal Cors.   CKD (chronic kidney disease), stage II    DDD (degenerative disc disease), lumbar    Diabetes mellitus DX: 2010   type 2   Erosive esophagitis    a. per EGD (08/2011), Dr. Karilyn Cota - Erosive reflux esophagitis improved but not completely healed since previous EGD 3 years ago.  Bx showing  ulcerated gatroesophageal junction mucosa. negative for H. pylori   GERD (gastroesophageal reflux disease)    Gout    Hearing deficit    a. wear bilateral hearing aides   History of hiatal hernia    History of kidney stones    passed stones, no surgery   Hypertension    Mildly dilatd aortic root (HCC)    CMR 4/22: EF 52, no LGE; d/w Dr. Lorretta Harp root 38 mm (mildly dilated)   Myocardial infarction [facility]) 2011   Neuropathy    Feet and legs   Nonischemic dilated cardiomyopathy (HCC)    a. H/O EF as low as 35-40% by LV gram 10/2007;  b. Echo 02/2011 EF 50-55%, inf HK, Gr 1 DD // CMR 4/22: EF 52, no LGE; d/w Dr. Lorretta Harp root 38 mm (mildly dilated)    Renal insufficiency    Sleep apnea    pt doesnt use, states"I cant afford one". PCP aware   Stroke (HCC)    mini-stroke in 2014   TIA (transient ischemic attack)    July, 2013   Tobacco abuse, in remission 06/27/2009   Discontinued in 2009     Wears dentures    top plate   WPW (Wolff-Parkinson-White syndrome)    a. s/p RFCA @ [facility] - 1999    Family History  Problem Relation Age of Onset   Heart attack Mother 86   Hypertension Mother    Diabetes Mother    Kidney disease Mother    Breast cancer Mother    Heart attack Father 9   Hypertension Father    Diabetes Father    Stroke Brother    Heart attack Brother 30   Stroke Maternal Grandmother 9   Diabetes Brother    Hypertension Brother     Past Surgical History:  Procedure Laterality Date   AMPUTATION Left 05/19/2022   Procedure: TRANSMETATARSAL AMPUTATION LEFT FOOT;  Surgeon: Nadara Mustard, MD;  Location: [facility] OR;  Service: Orthopedics;  Laterality: Left;   AMPUTATION Left 06/17/2023   Procedure: LEFT BELOW KNEE AMPUTATION;  Surgeon: Nadara Mustard, MD;  Location: [facility] OR;  Service: Orthopedics;  Laterality: Left;   APPENDECTOMY     BACK SURGERY     1995   CARDIAC CATHETERIZATION     2009   CARDIAC DEFIBRILLATOR PLACEMENT     ICD was removed in 2018    CHOLECYSTECTOMY     CHOLECYSTECTOMY, LAPAROSCOPIC     11/2007   COLONOSCOPY W/ POLYPECTOMY  2009   ELBOW SURGERY Left 06/2010   ESOPHAGEAL DILATION N/A 11/08/2014   Procedure: ESOPHAGEAL DILATION;  Surgeon: Malissa Hippo, MD;  Location: [facility] ORS;  Service: Endoscopy;  Laterality: N/A;  #56,    ESOPHAGOGASTRODUODENOSCOPY  03/31/2012   also 08/2011; [name]   ESOPHAGOGASTRODUODENOSCOPY (EGD) WITH PROPOFOL N/A 11/08/2014   Procedure: ESOPHAGOGASTRODUODENOSCOPY (EGD) WITH PROPOFOL;  Surgeon: Malissa Hippo, MD;  Location: [facility] ORS;  Service: Endoscopy;  Laterality:  N/A;  [place] is 58 , [place] is 37   FOOT ARTHRODESIS Left 10/24/2020   Procedure: LEFT GASTROCNEMIUS RECESSION, DORSIFLEXION OSTEOTOMY 1ST MT;  Surgeon: Nadara Mustard, MD;  Location: [facility] OR;  Service: Orthopedics;  Laterality: Left;   FOOT ARTHRODESIS Right 01/09/2021   Procedure: CLOSING WEDGE OSTEOTOMY RIGHT 1ST METATARSAL;  Surgeon: Nadara Mustard, MD;  Location: [facility] OR;  Service: Orthopedics;  Laterality: Right;   GASTROCNEMIUS RECESSION Right 01/09/2021   Procedure: RIGHT GASTROCNEMIUS RECESSION;  Surgeon: Nadara Mustard, MD;  Location: [facility] OR;  Service: Orthopedics;  Laterality: Right;   ICD LEAD REMOVAL N/A 10/07/2016   Procedure: ICD LEAD REMOVAL ;  Surgeon: Marinus Maw, MD;  Location: [facility] OR;  Service: Cardiovascular;  Laterality: N/A;   MULTIPLE TOOTH EXTRACTIONS     RADIOFREQUENCY ABLATION  1999   WPW; performed at [facility]   STUMP REVISION Left 07/22/2023   Procedure: REVISION LEFT BELOW KNEE AMPUTATION;  Surgeon: Nadara Mustard, MD;  Location: [facility] OR;  Service: Orthopedics;  Laterality: Left;   TEE WITHOUT CARDIOVERSION N/A 10/07/2016   Procedure: TRANSESOPHAGEAL ECHOCARDIOGRAM (TEE);  Surgeon: Marinus Maw, MD;  Location: [facility] OR;  Service: Cardiovascular;  Laterality: N/A;   Social History   Occupational History   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    Comment: Laid off in 11/11  Tobacco  Use   Smoking status: Former    Current packs/day: 0.00    Average packs/day: 1.5 packs/day for 39.1 years (58.6 ttl pk-yrs)    Types: Cigarettes    Start date: 06/30/1971    Quit date: 07/30/2010    Years since quitting: 13.0   Smokeless tobacco: Former    Types: Chew    Quit date: 03/26/1994  Vaping Use   Vaping status: Former   Start date: 07/30/2010   Quit date: 06/29/2016  Substance and Sexual Activity   Alcohol use: No    Alcohol/week: 1.0 standard drink of alcohol    Types: 1 Cans of beer per week    Comment: used "years ago"   Drug use: No   Sexual activity: Yes    Birth control/protection: None

## 2023-08-05 DIAGNOSIS — I130 Hypertensive heart and chronic kidney disease with heart failure and stage 1 through stage 4 chronic kidney disease, or unspecified chronic kidney disease: Secondary | ICD-10-CM | POA: Diagnosis not present

## 2023-08-05 DIAGNOSIS — E1122 Type 2 diabetes mellitus with diabetic chronic kidney disease: Secondary | ICD-10-CM | POA: Diagnosis not present

## 2023-08-05 DIAGNOSIS — E1140 Type 2 diabetes mellitus with diabetic neuropathy, unspecified: Secondary | ICD-10-CM | POA: Diagnosis not present

## 2023-08-05 DIAGNOSIS — T8781 Dehiscence of amputation stump: Secondary | ICD-10-CM | POA: Diagnosis not present

## 2023-08-05 DIAGNOSIS — I428 Other cardiomyopathies: Secondary | ICD-10-CM | POA: Diagnosis not present

## 2023-08-05 DIAGNOSIS — G4730 Sleep apnea, unspecified: Secondary | ICD-10-CM | POA: Diagnosis not present

## 2023-08-05 DIAGNOSIS — I5020 Unspecified systolic (congestive) heart failure: Secondary | ICD-10-CM | POA: Diagnosis not present

## 2023-08-05 DIAGNOSIS — I2510 Atherosclerotic heart disease of native coronary artery without angina pectoris: Secondary | ICD-10-CM | POA: Diagnosis not present

## 2023-08-05 DIAGNOSIS — M86172 Other acute osteomyelitis, left ankle and foot: Secondary | ICD-10-CM | POA: Diagnosis not present

## 2023-08-05 DIAGNOSIS — N182 Chronic kidney disease, stage 2 (mild): Secondary | ICD-10-CM | POA: Diagnosis not present

## 2023-08-09 ENCOUNTER — Other Ambulatory Visit: Payer: Self-pay | Admitting: Orthopedic Surgery

## 2023-08-09 DIAGNOSIS — E1122 Type 2 diabetes mellitus with diabetic chronic kidney disease: Secondary | ICD-10-CM | POA: Diagnosis not present

## 2023-08-09 DIAGNOSIS — I428 Other cardiomyopathies: Secondary | ICD-10-CM | POA: Diagnosis not present

## 2023-08-09 DIAGNOSIS — G4730 Sleep apnea, unspecified: Secondary | ICD-10-CM | POA: Diagnosis not present

## 2023-08-09 DIAGNOSIS — N182 Chronic kidney disease, stage 2 (mild): Secondary | ICD-10-CM | POA: Diagnosis not present

## 2023-08-09 DIAGNOSIS — T8781 Dehiscence of amputation stump: Secondary | ICD-10-CM | POA: Diagnosis not present

## 2023-08-09 DIAGNOSIS — I5020 Unspecified systolic (congestive) heart failure: Secondary | ICD-10-CM | POA: Diagnosis not present

## 2023-08-09 DIAGNOSIS — E1140 Type 2 diabetes mellitus with diabetic neuropathy, unspecified: Secondary | ICD-10-CM | POA: Diagnosis not present

## 2023-08-09 DIAGNOSIS — I130 Hypertensive heart and chronic kidney disease with heart failure and stage 1 through stage 4 chronic kidney disease, or unspecified chronic kidney disease: Secondary | ICD-10-CM | POA: Diagnosis not present

## 2023-08-09 DIAGNOSIS — I2510 Atherosclerotic heart disease of native coronary artery without angina pectoris: Secondary | ICD-10-CM | POA: Diagnosis not present

## 2023-08-09 DIAGNOSIS — M86172 Other acute osteomyelitis, left ankle and foot: Secondary | ICD-10-CM | POA: Diagnosis not present

## 2023-08-16 ENCOUNTER — Encounter: Payer: Self-pay | Admitting: Family

## 2023-08-16 ENCOUNTER — Ambulatory Visit: Payer: Medicare Other | Admitting: Family

## 2023-08-16 DIAGNOSIS — T8781 Dehiscence of amputation stump: Secondary | ICD-10-CM

## 2023-08-16 MED ORDER — DOXYCYCLINE HYCLATE 100 MG PO TABS: 100.0000 mg | ORAL_TABLET | Freq: Two times a day (BID) | ORAL | 0 refills | Status: DC

## 2023-08-16 NOTE — Progress Notes (Signed)
 Office Visit Note   Patient: Mike Floyd           Date of Birth: [birth year]           MRN: [id number] Visit Date: 08/16/2023              Requested by: Bertell Satterfield, MD [address] [place],  [place] [address] PCP: Bertell Satterfield, MD  No chief complaint on file.     HPI: The patient is a 63 year old gentleman who is seen status post revision below-knee amputation.  Has had some increasing pain especially to the medial aspect of his right below-knee amputation  Assessment & Plan: Visit Diagnoses: No diagnosis found.  Plan: Dehiscence and necrosis left below-knee amputation.  Dr. Harden has also seen the patient today.  The patient is in agreement with the plan to proceed with above-knee amputation on the right.  Follow-Up Instructions: No follow-ups on file.   Ortho Exam  Patient is alert, oriented, no adenopathy, well-dressed, normal affect, normal respiratory effort. On examination left residual limb there is dehiscence of the incision medially with purulent drainage, copious.  There is no ascending cellulitis  Imaging: No results found. No images are attached to the encounter.  Labs: Lab Results  Component Value Date   HGBA1C 7.7 (H) 06/17/2023   HGBA1C 9.7 (H) 05/13/2022   HGBA1C 7.0 (H) 06/15/2017   ESRSEDRATE 2 03/14/2023   CRP <1 03/14/2023   CRP 0.6 05/16/2022   CRP 2.2 06/27/2019   LABURIC 5.5 03/07/2012   REPTSTATUS 05/18/2022 FINAL 05/13/2022   CULT  05/13/2022    NO GROWTH 5 DAYS Performed at [facility], [address], [place], [place] [address]      Lab Results  Component Value Date   ALBUMIN  4.0 06/17/2023   ALBUMIN  4.4 05/04/2023   ALBUMIN  4.1 02/26/2023   PREALBUMIN 23 06/17/2023    Lab Results  Component Value Date   MG 1.8 02/26/2023   MG 1.7 05/20/2022   MG 2.0 05/19/2022   Lab Results  Component Value Date   VD25OH 18 (L) 06/15/2017    Lab Results  Component Value Date   PREALBUMIN 23 06/17/2023      Latest  Ref Rng & Units 07/22/2023    9:33 AM 06/17/2023    8:40 AM 05/04/2023    9:47 AM  CBC EXTENDED  WBC 4.0 - 10.5 K/uL 8.4  5.3  6.1   RBC 4.22 - 5.81 MIL/uL 4.88  4.71  4.86   Hemoglobin 13.0 - 17.0 g/dL 85.9  86.0  85.0   HCT 39.0 - 52.0 % 40.8  39.7  41.3   Platelets 150 - 400 K/uL 164  148  162   NEUT# 1.7 - 7.7 K/uL  3.6  4.5   Lymph# 0.7 - 4.0 K/uL  1.1  1.0      There is no height or weight on file to calculate BMI.  Orders:  No orders of the defined types were placed in this encounter.  No orders of the defined types were placed in this encounter.    Procedures: No procedures performed  Clinical Data: No additional findings.  ROS:  All other systems negative, except as noted in the HPI. Review of Systems  Objective: Vital Signs: There were no vitals taken for this visit.  Specialty Comments:  No specialty comments available.  PMFS History: Patient Active Problem List   Diagnosis Date Noted   Dehiscence of amputation stump of left lower extremity (HCC)  07/22/2023   Ischemia of site of left below knee amputation (HCC) 07/22/2023   S/P BKA (below knee amputation) unilateral, left (HCC) 07/22/2023   Below-knee amputation of left lower extremity (HCC) 06/17/2023   Acute osteomyelitis of metatarsal bone of left foot (HCC)    Subacute osteomyelitis, left ankle and foot (HCC) 05/13/2022   Non-pressure chronic ulcer of other part of right foot limited to breakdown of skin (HCC)    Contracture of right Achilles tendon    Mildly dilatd aortic root (HCC)    Contracture of left Achilles tendon    Metatarsal deformity, left    CAD (coronary artery disease)    Hallux hammertoe, left 12/11/2019   Diarrhea 06/27/2019   Diastasis recti 08/22/2018   Umbilical hernia without obstruction and without gangrene 08/22/2018   Vitamin D  deficiency 06/23/2017   Essential hypertension, benign 06/15/2017   Mixed hyperlipidemia 06/15/2017   Class 1 obesity due to excess calories  with serious comorbidity and body mass index (BMI) of 31.0 to 31.9 in adult 06/15/2017   Malfunction of implantable cardioverter-defibrillator (ICD) electrode 10/07/2016   Chronic kidney disease (CKD), stage III (moderate) (HCC) 04/04/2015   Bladder neck obstruction 03/21/2015   Difficult or painful urination 03/21/2015   Flank pain 03/21/2015   Delayed onset of urination 03/21/2015   Calculus of kidney 03/21/2015   Erosive esophagitis 12/27/2013   Hypotension due to drugs 07/02/2013   Chronic systolic heart failure (HCC) 04/24/2013   CAP (community acquired pneumonia) 03/16/2013   HTN (hypertension) 03/15/2013   Hydrocele 03/15/2013   Spermatocele 03/15/2013   DOE (dyspnea on exertion) 03/15/2013   OSA (obstructive sleep apnea) 01/09/2013   Chronic kidney disease, stage 3 (HCC) 01/03/2013   Chest pain 12/26/2012   History of diagnostic tests 09/06/2012   TIA (transient ischemic attack)    Cardiomyopathy, nonischemic (HCC) 02/24/2010   AICD (automatic cardioverter/defibrillator) 02/24/2010   DM type 2 causing vascular disease (HCC) 06/27/2009   Gout 06/27/2009   Tobacco abuse, in remission 06/27/2009   COLONIC POLYPS 10/31/2008   GERD (gastroesophageal reflux disease) 10/31/2008   Past Medical History:  Diagnosis Date   AICD (automatic cardioverter/defibrillator) present 11/2007   Removed in 2018, a. 11/2007 S[facility] Current VR - single lead ICD  - Removed 2018 - it was burning me   Anxiety    CAD (coronary artery disease)    non-obstructive CAD by Cor CT in 2020 // Myoview  3/22: EF 57, small inf-sept defect c/w scar, no ischemia; low risk      Chest pain    a. 10/2007 Cath:  normal Cors.   CKD (chronic kidney disease), stage II    DDD (degenerative disc disease), lumbar    Diabetes mellitus DX: 2010   type 2   Erosive esophagitis    a. per EGD (08/2011), Dr. Golda - Erosive reflux esophagitis improved but not completely healed since previous EGD 3 years ago. Bx showing   ulcerated gatroesophageal junction mucosa. negative for H. pylori   GERD (gastroesophageal reflux disease)    Gout    Hearing deficit    a. wear bilateral hearing aides   History of hiatal hernia    History of kidney stones    passed stones, no surgery   Hypertension    Mildly dilatd aortic root (HCC)    CMR 4/22: EF 52, no LGE; d/w Dr. Ione root 38 mm (mildly dilated)   Myocardial infarction [facility]) 2011   Neuropathy    Feet and legs   Nonischemic  dilated cardiomyopathy (HCC)    a. H/O EF as low as 35-40% by LV gram 10/2007;  b. Echo 02/2011 EF 50-55%, inf HK, Gr 1 DD // CMR 4/22: EF 52, no LGE; d/w Dr. Ione root 38 mm (mildly dilated)    Renal insufficiency    Sleep apnea    pt doesnt use, statesI cant afford one. PCP aware   Stroke (HCC)    mini-stroke in 2014   TIA (transient ischemic attack)    July, 2013   Tobacco abuse, in remission 06/27/2009   Discontinued in 2009     Wears dentures    top plate   WPW (Wolff-Parkinson-White syndrome)    a. s/p RFCA @ [facility] - 1999    Family History  Problem Relation Age of Onset   Heart attack Mother 72   Hypertension Mother    Diabetes Mother    Kidney disease Mother    Breast cancer Mother    Heart attack Father 80   Hypertension Father    Diabetes Father    Stroke Brother    Heart attack Brother 77   Stroke Maternal Grandmother 88   Diabetes Brother    Hypertension Brother     Past Surgical History:  Procedure Laterality Date   AMPUTATION Left 05/19/2022   Procedure: TRANSMETATARSAL AMPUTATION LEFT FOOT;  Surgeon: Harden Jerona GAILS, MD;  Location: [facility] OR;  Service: Orthopedics;  Laterality: Left;   AMPUTATION Left 06/17/2023   Procedure: LEFT BELOW KNEE AMPUTATION;  Surgeon: Harden Jerona GAILS, MD;  Location[facility];  Service: Orthopedics;  Laterality: Left;   APPENDECTOMY     BACK SURGERY     1995   CARDIAC CATHETERIZATION     2009   CARDIAC DEFIBRILLATOR PLACEMENT     ICD was removed in 2018   CHOLECYSTECTOMY      CHOLECYSTECTOMY, LAPAROSCOPIC     11/2007   COLONOSCOPY W/ POLYPECTOMY  2009   ELBOW SURGERY Left 06/2010   ESOPHAGEAL DILATION N/A 11/08/2014   Procedure: ESOPHAGEAL DILATION;  Surgeon: Claudis RAYMOND Rivet, MD;  Location: [facility] ORS;  Service: Endoscopy;  Laterality: N/A;  #56,    ESOPHAGOGASTRODUODENOSCOPY  03/31/2012   also 08/2011; [name]   ESOPHAGOGASTRODUODENOSCOPY (EGD) WITH PROPOFOL  N/A 11/08/2014   Procedure: ESOPHAGOGASTRODUODENOSCOPY (EGD) WITH PROPOFOL ;  Surgeon: Claudis RAYMOND Rivet, MD;  Location: [facility] ORS;  Service: Endoscopy;  Laterality: N/A;  Hiatus is 34 , GE [place]unction is 37   FOOT ARTHRODESIS Left 10/24/2020   Procedure: LEFT GASTROCNEMIUS RECESSION, DORSIFLEXION OSTEOTOMY 1ST MT;  Surgeon: Harden Jerona GAILS, MD;  Location: [facility] OR;  Service: Orthopedics;  Laterality: Left;   FOOT ARTHRODESIS Right 01/09/2021   Procedure: CLOSING WEDGE OSTEOTOMY RIGHT 1ST METATARSAL;  Surgeon: Harden Jerona GAILS, MD;  Location: [facility] OR;  Service: Orthopedics;  Laterality: Right;   GASTROCNEMIUS RECESSION Right 01/09/2021   Procedure: RIGHT GASTROCNEMIUS RECESSION;  Surgeon: Harden Jerona GAILS, MD;  Location: [facility] OR;  Service: Orthopedics;  Laterality: Right;   ICD LEAD REMOVAL N/A 10/07/2016   Procedure: ICD LEAD REMOVAL ;  Surgeon: Danelle LELON Birmingham, MD;  Location: [facility] OR;  Service: Cardiovascular;  Laterality: N/A;   MULTIPLE TOOTH EXTRACTIONS     RADIOFREQUENCY ABLATION  1999   WPW; performed at [facility]   STUMP REVISION Left 07/22/2023   Procedure: REVISION LEFT BELOW KNEE AMPUTATION;  Surgeon: Harden Jerona GAILS, MD;  Location: [facility] OR;  Service: Orthopedics;  Laterality: Left;   TEE WITHOUT CARDIOVERSION N/A 10/07/2016   Procedure: TRANSESOPHAGEAL ECHOCARDIOGRAM (  TEE);  Surgeon: Danelle LELON Birmingham, MD;  Location: [facility] OR;  Service: Cardiovascular;  Laterality: N/A;   Social History   Occupational History   Occupation: Worked for [employer]    Employer: [employer]    Comment: Laid off in 11/11  Tobacco Use   Smoking  status: Former    Current packs/day: 0.00    Average packs/day: 1.5 packs/day for 39.1 years (58.6 ttl pk-yrs)    Types: Cigarettes    Start date: 06/30/1971    Quit date: 07/30/2010    Years since quitting: 13.0   Smokeless tobacco: Former    Types: Chew    Quit date: 03/26/1994  Vaping Use   Vaping status: Former   Start date: 07/30/2010   Quit date: 06/29/2016  Substance and Sexual Activity   Alcohol use: No    Alcohol/week: 1.0 standard drink of alcohol    Types: 1 Cans of beer per week    Comment: used years ago   Drug use: No   Sexual activity: Yes    Birth control/protection: None

## 2023-08-18 ENCOUNTER — Other Ambulatory Visit: Payer: Self-pay

## 2023-08-18 ENCOUNTER — Encounter (HOSPITAL_COMMUNITY): Payer: Self-pay | Admitting: Orthopedic Surgery

## 2023-08-18 MED ORDER — TRANEXAMIC ACID 1000 MG/10ML IV SOLN
2000.0000 mg | INTRAVENOUS | Status: DC
  Filled 2023-08-18 (×2): qty 20

## 2023-08-18 NOTE — Progress Notes (Signed)
 Anesthesia Chart Review: Same day workup  63 year old male follows with cardiology for history of nonobstructive CAD, CVA, HTN, HLD, WPW (s/p RFA in 1999), history of ICD (placed in 2009 but removed in 2018), heart failure with improved EF (EF 50 to 55% by echo 05/2022), palpitations, HLD, mildly dilated aortic root, DOE.  Coronary CTA 05/2023 showed only 0 to 24% stenosis along the left main, proximal/mid LAD, and proximal left circumflex.  Recent chest pains were felt to be noncardiac, possibly secondary to history of esophageal dysmotility/stricture.  Recent monitor in September 2024 showed predominantly NSR with rare PACs and PVCs.   Other pertinent history includes CKD 2, OSA, GERD, hiatal hernia, IDDM2 (last A1c in epic 7.7 on 06/17/23).  Recently s/p left BKA 06/17/2023 with revision 07/22/2023.  Now requires left AKA due to dehiscence and necrosis.   Patient will need day of surgery labs and evaluation.   EKG 05/04/2023: Sinus rhythm.  Rate 94. Minimal ST elevation, anterior leads.  No significant change.   Event Monitor: 04/2023 Patch Wear Time:  2 days and 3 hours (2024-09-05T10:55:58-0400 to 2024-09-07T14:05:19-0400)   Patient had a min HR of 63 bpm, max HR of 120 bpm, and avg HR of 86 bpm. Predominant underlying rhythm was Sinus Rhythm. Isolated SVEs were rare (<1.0%), SVE Couplets were rare (<1.0%), and SVE Triplets were rare (<1.0%). No Isolated VEs, VE Couplets, or  VE Triplets were present.    Coronary CT: 05/2023 IMPRESSION: 1. Coronary calcium  score of 119. This was 63rd percentile for age and sex matched control.   2. Total plaque volume 19mm3 which is 30th percentile for age and sex-matched controls (calcified plaque 66mm3; noncalcified plaque 154mm3). TPV is moderate   3.  Normal coronary origin with right dominance.   4.  Nonobstructive CAD   5. Minimal (0-24%) stenosis in left main, proximal/mid LAD, and proximal LCX   6.  Dilated aortic root measuring 41mm    CAD-RADS 1. Minimal non-obstructive CAD (0-24%). Consider non-atherosclerotic causes of chest pain. Consider preventive therapy and risk factor modification.   TTE 05/16/2022:  1. Left ventricular ejection fraction, by estimation, is 50 to 55%. The  left ventricle has low normal function. The left ventricle has no regional  wall motion abnormalities. Left ventricular diastolic parameters are  consistent with Grade I diastolic  dysfunction (impaired relaxation).   2. Right ventricular systolic function is normal. The right ventricular  size is normal. Tricuspid regurgitation signal is inadequate for assessing  PA pressure.   3. The mitral valve is normal in structure. No evidence of mitral valve  regurgitation.   4. The aortic valve is tricuspid. Aortic valve regurgitation is not  visualized. No aortic stenosis is present.   5. Aortic dilatation noted. There is borderline dilatation of the aortic  root, measuring 39 mm.   Comparison(s): Prior images reviewed side by side. The left ventricular  function has improved.       Lynwood Geofm RIGGERS [facility] Short Stay Center/Anesthesiology Phone [phone] 08/18/2023 10:53 AM

## 2023-08-18 NOTE — Progress Notes (Signed)
 PCP - Bertell Satterfield, MD  Cardiologist - Delford Maude BROCKS, MD   PPM/ICD - ICD removed Device Orders - n/a Rep Notified - n/a  Chest x-ray - 05-04-23 EKG - 05-04-23 Stress Test - 07-16-13 ECHO -05-16-22  Cardiac Cath -   CPAP - Denies    Fasting Blood Sugar - Per wife between 160-180 Checks Blood Sugar daily  Blood Thinner Instructions: denies Aspirin  Instructions: aspirin  81 Instructed patient's wife to call surgeon's office for instructions  ERAS Protcol - Clear liquids until 6:15  COVID TEST- n/a  Anesthesia review: Yes Hx of Dm, CKD, HTN  Patient verbally denies any shortness of breath, fever, cough and chest pain during phone call   -------------  SDW INSTRUCTIONS given:  Your procedure is scheduled on August 19, 2023.  Report to [facility] Main Entrance A at 6:45 A.M., and check in at the Admitting office.  Call this number if you have problems the morning of surgery:  [phone]   Remember:  Do not eat after midnight the night before your surgery  You may drink clear liquids until 6:15 the morning of your surgery.   Clear liquids allowed are: Water , Non-Citrus Juices (without pulp), Carbonated Beverages, Clear Tea, Black Coffee Only, and Gatorade    Take these medicines the morning of surgery with A SIP OF WATER   atorvastatin  (LIPITOR)  carvedilol  (COREG )  famotidine  (PEPCID )  gabapentin  (NEURONTIN )  isosorbide  mononitrate (IMDUR )  pantoprazole  (PROTONIX )  tamsulosin  (FLOMAX )   IF NEEDED nitroGLYCERIN  (NITROSTAT )  oxycodone  (ROXICODONE ) ? oxyCODONE -acetaminophen  (PERCOCET/ROXICET) ?   WHAT DO I DO ABOUT MY DIABETES MEDICATION?   Do not take oral diabetes medicines (pills) the morning of surgery.  THE NIGHT BEFORE SURGERY, take 10 units of (LANTUS ) insulin .      THE MORNING OF SURGERY, take 0 units of insulin .  The day of surgery, do not take other diabetes injectables, including Byetta (exenatide), Bydureon (exenatide ER), Victoza  (liraglutide), or Trulicity (dulaglutide).  If your CBG is greater than 220 mg/dL, you may take  of your sliding scale (correction) dose of insulin .   HOW TO MANAGE YOUR DIABETES BEFORE AND AFTER SURGERY  Why is it important to control my blood sugar before and after surgery? Improving blood sugar levels before and after surgery helps healing and can limit problems. A way of improving blood sugar control is eating a healthy diet by:  Eating less sugar and carbohydrates  Increasing activity/exercise  Talking with your doctor about reaching your blood sugar goals High blood sugars (greater than 180 mg/dL) can raise your risk of infections and slow your recovery, so you will need to focus on controlling your diabetes during the weeks before surgery. Make sure that the doctor who takes care of your diabetes knows about your planned surgery including the date and location.  How do I manage my blood sugar before surgery? Check your blood sugar at least 4 times a day, starting 2 days before surgery, to make sure that the level is not too high or low.  Check your blood sugar the morning of your surgery when you wake up and every 2 hours until you get to the Short Stay unit.  If your blood sugar is less than 70 mg/dL, you will need to treat for low blood sugar: Do not take insulin . Treat a low blood sugar (less than 70 mg/dL) with  cup of clear juice (cranberry or apple), 4 glucose tablets, OR glucose gel. Recheck blood sugar in 15 minutes after treatment (to  make sure it is greater than 70 mg/dL). If your blood sugar is not greater than 70 mg/dL on recheck, call [phone] for further instructions. Report your blood sugar to the short stay nurse when you get to Short Stay.  If you are admitted to the hospital after surgery: Your blood sugar will be checked by the staff and you will probably be given insulin  after surgery (instead of oral diabetes medicines) to make sure you have good blood  sugar levels. The goal for blood sugar control after surgery is 80-180 mg/dL.  As of today, STOP taking any Aspirin  (unless otherwise instructed by your surgeon) Aleve , Naproxen , Ibuprofen , Motrin , Advil , Goody's, BC's, all herbal medications, fish oil, and all vitamins.                      Do not wear jewelry, make up, or nail polish            Do not wear lotions, powders, perfumes/colognes, or deodorant.            Do not shave 48 hours prior to surgery.  Men may shave face and neck.            Do not bring valuables to the hospital.            [facility] is not responsible for any belongings or valuables.  Do NOT Smoke (Tobacco/Vaping) 24 hours prior to your procedure If you use a CPAP at night, you may bring all equipment for your overnight stay.   Contacts, glasses, dentures or bridgework may not be worn into surgery.      For patients admitted to the hospital, discharge time will be determined by your treatment team.   Patients discharged the day of surgery will not be allowed to drive home, and someone needs to stay with them for 24 hours.    Special instructions:   [place]- Preparing For Surgery  Before surgery, you can play an important role. Because skin is not sterile, your skin needs to be as free of germs as possible. You can reduce the number of germs on your skin by washing with CHG (chlorahexidine gluconate) Soap before surgery.  CHG is an antiseptic cleaner which kills germs and bonds with the skin to continue killing germs even after washing.    Oral Hygiene is also important to reduce your risk of infection.  Remember - BRUSH YOUR TEETH THE MORNING OF SURGERY WITH YOUR REGULAR TOOTHPASTE  Please do not use if you have an allergy to CHG or antibacterial soaps. If your skin becomes reddened/irritated stop using the CHG.  Do not shave (including legs and underarms) for at least 48 hours prior to first CHG shower. It is OK to shave your face.  Please follow these  instructions carefully.   Shower the NIGHT BEFORE SURGERY and the MORNING OF SURGERY with DIAL Soap.   Pat yourself dry with a CLEAN TOWEL.  Wear CLEAN PAJAMAS to bed the night before surgery  Place CLEAN SHEETS on your bed the night of your first shower and DO NOT SLEEP WITH PETS.   Day of Surgery: Please shower morning of surgery  Wear Clean/Comfortable clothing the morning of surgery Do not apply any deodorants/lotions.   Remember to brush your teeth WITH YOUR REGULAR TOOTHPASTE.   Questions were answered. Patient verbalized understanding of instructions.

## 2023-08-18 NOTE — Anesthesia Preprocedure Evaluation (Addendum)
 Anesthesia Evaluation  Patient identified by MRN, date of birth, ID band Patient awake    Reviewed: Allergy & Precautions, H&P , NPO status , Patient's Chart, lab work & pertinent test results  Airway Mallampati: II  TM Distance: >3 FB Neck ROM: Full    Dental no notable dental hx. (+) Teeth Intact, Dental Advisory Given   Pulmonary sleep apnea , former smoker   Pulmonary exam normal breath sounds clear to auscultation       Cardiovascular Exercise Tolerance: Good hypertension, Pt. on medications and Pt. on home beta blockers + CAD, + Past MI, + Peripheral Vascular Disease and + DOE  + Cardiac Defibrillator  Rhythm:Regular Rate:Normal     Neuro/Psych   Anxiety     TIA   GI/Hepatic Neg liver ROS, hiatal hernia, PUD,GERD  Medicated,,  Endo/Other  diabetes, Insulin  Dependent    Renal/GU Renal disease  negative genitourinary   Musculoskeletal  (+) Arthritis , Osteoarthritis,    Abdominal   Peds  Hematology negative hematology ROS (+)   Anesthesia Other Findings   Reproductive/Obstetrics negative OB ROS                             Anesthesia Physical Anesthesia Plan  ASA: 3  Anesthesia Plan: Spinal   Post-op Pain Management: Tylenol  PO (pre-op)*   Induction: Intravenous  PONV Risk Score and Plan: 2 and Propofol  infusion and Ondansetron   Airway Management Planned: Natural Airway and Simple Face Mask  Additional Equipment:   Intra-op Plan:   Post-operative Plan:   Informed Consent: I have reviewed the patients History and Physical, chart, labs and discussed the procedure including the risks, benefits and alternatives for the proposed anesthesia with the patient or authorized representative who has indicated his/her understanding and acceptance.     Dental advisory given  Plan Discussed with: CRNA  Anesthesia Plan Comments: (PAT note by Lynwood Hope, PA-C:  63 year old male  follows with cardiology for history of nonobstructive CAD, CVA, HTN, HLD, WPW (s/p RFA in 1999), history of ICD (placed in 2009 but removed in 2018), heart failure with improved EF (EF 50 to 55% by echo 05/2022), palpitations, HLD, mildly dilated aortic root, DOE.  Coronary CTA 05/2023 showed only 0 to 24% stenosis along the left main, proximal/mid LAD, and proximal left circumflex.  Recent chest pains were felt to be noncardiac, possibly secondary to history of esophageal dysmotility/stricture.  Recent monitor in September 2024 showed predominantly NSR with rare PACs and PVCs.  Other pertinent history includes CKD 2, OSA, GERD, hiatal hernia, IDDM2 (last A1c in epic 7.7 on 06/17/23).  Recently s/p left BKA 06/17/2023 with revision 07/22/2023.  Now requires left AKA due to dehiscence and necrosis.  Patient will need day of surgery labs and evaluation.  EKG 05/04/2023: Sinus rhythm.  Rate 94. Minimal ST elevation, anterior leads.  No significant change.  Event Monitor: 04/2023 Patch Wear Time:  2 days and 3 hours (2024-09-05T10:55:58-0400 to 2024-09-07T14:05:19-0400)  Patient had a min HR of 63 bpm, max HR of 120 bpm, and avg HR of 86 bpm. Predominant underlying rhythm was Sinus Rhythm. Isolated SVEs were rare (<1.0%), SVE Couplets were rare (<1.0%), and SVE Triplets were rare (<1.0%). No Isolated VEs, VE Couplets, or  VE Triplets were present.   Coronary CT: 05/2023 IMPRESSION: 1. Coronary calcium  score of 119. This was 63rd percentile for age and sex matched control.  2. Total plaque volume 191mm3 which is 30th percentile  for age and sex-matched controls (calcified plaque 23mm3; noncalcified plaque 169mm3). TPV is moderate  3.  Normal coronary origin with right dominance.  4.  Nonobstructive CAD  5. Minimal (0-24%) stenosis in left main, proximal/mid LAD, and proximal LCX  6.  Dilated aortic root measuring 41mm  CAD-RADS 1. Minimal non-obstructive CAD (0-24%).  Consider non-atherosclerotic causes of chest pain. Consider preventive therapy and risk factor modification.  TTE 05/16/2022: 1. Left ventricular ejection fraction, by estimation, is 50 to 55%. The  left ventricle has low normal function. The left ventricle has no regional  wall motion abnormalities. Left ventricular diastolic parameters are  consistent with Grade I diastolic  dysfunction (impaired relaxation).  2. Right ventricular systolic function is normal. The right ventricular  size is normal. Tricuspid regurgitation signal is inadequate for assessing  PA pressure.  3. The mitral valve is normal in structure. No evidence of mitral valve  regurgitation.  4. The aortic valve is tricuspid. Aortic valve regurgitation is not  visualized. No aortic stenosis is present.  5. Aortic dilatation noted. There is borderline dilatation of the aortic  root, measuring 39 mm.   Comparison(s): Prior images reviewed side by side. The left ventricular  function has improved.    )        Anesthesia Quick Evaluation

## 2023-08-19 ENCOUNTER — Encounter (HOSPITAL_COMMUNITY)
Admission: RE | Disposition: A | Discharge status: Rehabilitation Facility | Payer: Self-pay | Source: Home / Self Care | Attending: Orthopedic Surgery

## 2023-08-19 ENCOUNTER — Other Ambulatory Visit: Payer: Self-pay

## 2023-08-19 ENCOUNTER — Encounter (HOSPITAL_COMMUNITY): Payer: Self-pay | Admitting: Orthopedic Surgery

## 2023-08-19 ENCOUNTER — Inpatient Hospital Stay (HOSPITAL_COMMUNITY): Payer: Medicare Other | Admitting: Physician Assistant

## 2023-08-19 ENCOUNTER — Inpatient Hospital Stay (HOSPITAL_COMMUNITY)
Admission: RE | Admit: 2023-08-19 | Discharge: 2023-08-24 | DRG: 464 | Disposition: A | Discharge status: Rehabilitation Facility | Payer: Medicare Other | Attending: Orthopedic Surgery | Admitting: Orthopedic Surgery

## 2023-08-19 DIAGNOSIS — Z823 Family history of stroke: Secondary | ICD-10-CM

## 2023-08-19 DIAGNOSIS — I129 Hypertensive chronic kidney disease with stage 1 through stage 4 chronic kidney disease, or unspecified chronic kidney disease: Secondary | ICD-10-CM | POA: Diagnosis not present

## 2023-08-19 DIAGNOSIS — Z794 Long term (current) use of insulin: Secondary | ICD-10-CM

## 2023-08-19 DIAGNOSIS — Z803 Family history of malignant neoplasm of breast: Secondary | ICD-10-CM | POA: Diagnosis not present

## 2023-08-19 DIAGNOSIS — Z4781 Encounter for orthopedic aftercare following surgical amputation: Secondary | ICD-10-CM | POA: Diagnosis not present

## 2023-08-19 DIAGNOSIS — S88012A Complete traumatic amputation at knee level, left lower leg, initial encounter: Secondary | ICD-10-CM | POA: Diagnosis not present

## 2023-08-19 DIAGNOSIS — I420 Dilated cardiomyopathy: Secondary | ICD-10-CM | POA: Diagnosis present

## 2023-08-19 DIAGNOSIS — Z974 Presence of external hearing-aid: Secondary | ICD-10-CM | POA: Diagnosis not present

## 2023-08-19 DIAGNOSIS — I10 Essential (primary) hypertension: Secondary | ICD-10-CM | POA: Diagnosis not present

## 2023-08-19 DIAGNOSIS — I96 Gangrene, not elsewhere classified: Secondary | ICD-10-CM | POA: Diagnosis not present

## 2023-08-19 DIAGNOSIS — Z89612 Acquired absence of left leg above knee: Secondary | ICD-10-CM | POA: Diagnosis not present

## 2023-08-19 DIAGNOSIS — M109 Gout, unspecified: Secondary | ICD-10-CM | POA: Diagnosis not present

## 2023-08-19 DIAGNOSIS — Z841 Family history of disorders of kidney and ureter: Secondary | ICD-10-CM | POA: Diagnosis not present

## 2023-08-19 DIAGNOSIS — E119 Type 2 diabetes mellitus without complications: Secondary | ICD-10-CM | POA: Diagnosis not present

## 2023-08-19 DIAGNOSIS — Z8249 Family history of ischemic heart disease and other diseases of the circulatory system: Secondary | ICD-10-CM | POA: Diagnosis not present

## 2023-08-19 DIAGNOSIS — S88112A Complete traumatic amputation at level between knee and ankle, left lower leg, initial encounter: Secondary | ICD-10-CM | POA: Diagnosis not present

## 2023-08-19 DIAGNOSIS — L97829 Non-pressure chronic ulcer of other part of left lower leg with unspecified severity: Secondary | ICD-10-CM | POA: Diagnosis not present

## 2023-08-19 DIAGNOSIS — I5022 Chronic systolic (congestive) heart failure: Secondary | ICD-10-CM | POA: Diagnosis not present

## 2023-08-19 DIAGNOSIS — L97824 Non-pressure chronic ulcer of other part of left lower leg with necrosis of bone: Secondary | ICD-10-CM | POA: Diagnosis not present

## 2023-08-19 DIAGNOSIS — R2689 Other abnormalities of gait and mobility: Secondary | ICD-10-CM | POA: Diagnosis not present

## 2023-08-19 DIAGNOSIS — T8781 Dehiscence of amputation stump: Secondary | ICD-10-CM

## 2023-08-19 DIAGNOSIS — M86172 Other acute osteomyelitis, left ankle and foot: Secondary | ICD-10-CM | POA: Diagnosis not present

## 2023-08-19 DIAGNOSIS — I2510 Atherosclerotic heart disease of native coronary artery without angina pectoris: Secondary | ICD-10-CM | POA: Diagnosis not present

## 2023-08-19 DIAGNOSIS — L02416 Cutaneous abscess of left lower limb: Secondary | ICD-10-CM | POA: Diagnosis not present

## 2023-08-19 DIAGNOSIS — E876 Hypokalemia: Secondary | ICD-10-CM | POA: Diagnosis not present

## 2023-08-19 DIAGNOSIS — N182 Chronic kidney disease, stage 2 (mild): Secondary | ICD-10-CM | POA: Diagnosis present

## 2023-08-19 DIAGNOSIS — E1142 Type 2 diabetes mellitus with diabetic polyneuropathy: Secondary | ICD-10-CM | POA: Diagnosis present

## 2023-08-19 DIAGNOSIS — E538 Deficiency of other specified B group vitamins: Secondary | ICD-10-CM | POA: Diagnosis not present

## 2023-08-19 DIAGNOSIS — K219 Gastro-esophageal reflux disease without esophagitis: Secondary | ICD-10-CM | POA: Diagnosis not present

## 2023-08-19 DIAGNOSIS — Z743 Need for continuous supervision: Secondary | ICD-10-CM | POA: Diagnosis not present

## 2023-08-19 DIAGNOSIS — M51369 Other intervertebral disc degeneration, lumbar region without mention of lumbar back pain or lower extremity pain: Secondary | ICD-10-CM | POA: Diagnosis not present

## 2023-08-19 DIAGNOSIS — E1152 Type 2 diabetes mellitus with diabetic peripheral angiopathy with gangrene: Secondary | ICD-10-CM | POA: Diagnosis not present

## 2023-08-19 DIAGNOSIS — G629 Polyneuropathy, unspecified: Secondary | ICD-10-CM | POA: Diagnosis not present

## 2023-08-19 DIAGNOSIS — E785 Hyperlipidemia, unspecified: Secondary | ICD-10-CM | POA: Diagnosis present

## 2023-08-19 DIAGNOSIS — I82409 Acute embolism and thrombosis of unspecified deep veins of unspecified lower extremity: Secondary | ICD-10-CM | POA: Diagnosis not present

## 2023-08-19 DIAGNOSIS — I252 Old myocardial infarction: Secondary | ICD-10-CM

## 2023-08-19 DIAGNOSIS — Z8719 Personal history of other diseases of the digestive system: Secondary | ICD-10-CM

## 2023-08-19 DIAGNOSIS — G4700 Insomnia, unspecified: Secondary | ICD-10-CM | POA: Diagnosis not present

## 2023-08-19 DIAGNOSIS — G4730 Sleep apnea, unspecified: Secondary | ICD-10-CM | POA: Diagnosis not present

## 2023-08-19 DIAGNOSIS — I428 Other cardiomyopathies: Secondary | ICD-10-CM | POA: Diagnosis not present

## 2023-08-19 DIAGNOSIS — H9193 Unspecified hearing loss, bilateral: Secondary | ICD-10-CM | POA: Diagnosis not present

## 2023-08-19 DIAGNOSIS — R531 Weakness: Secondary | ICD-10-CM | POA: Diagnosis not present

## 2023-08-19 DIAGNOSIS — E1121 Type 2 diabetes mellitus with diabetic nephropathy: Secondary | ICD-10-CM | POA: Diagnosis not present

## 2023-08-19 DIAGNOSIS — Z833 Family history of diabetes mellitus: Secondary | ICD-10-CM

## 2023-08-19 DIAGNOSIS — Z87891 Personal history of nicotine dependence: Secondary | ICD-10-CM | POA: Diagnosis not present

## 2023-08-19 DIAGNOSIS — E1122 Type 2 diabetes mellitus with diabetic chronic kidney disease: Secondary | ICD-10-CM | POA: Diagnosis present

## 2023-08-19 DIAGNOSIS — Y835 Amputation of limb(s) as the cause of abnormal reaction of the patient, or of later complication, without mention of misadventure at the time of the procedure: Secondary | ICD-10-CM | POA: Diagnosis present

## 2023-08-19 DIAGNOSIS — Z8673 Personal history of transient ischemic attack (TIA), and cerebral infarction without residual deficits: Secondary | ICD-10-CM | POA: Diagnosis not present

## 2023-08-19 HISTORY — PX: AMPUTATION: SHX166

## 2023-08-19 LAB — CBC
HCT: 45.3 % (ref 39.0–52.0)
Hemoglobin: 15.5 g/dL (ref 13.0–17.0)
MCH: 28.9 pg (ref 26.0–34.0)
MCHC: 34.2 g/dL (ref 30.0–36.0)
MCV: 84.4 fL (ref 80.0–100.0)
Platelets: 138 10*3/uL — ABNORMAL LOW (ref 150–400)
RBC: 5.37 MIL/uL (ref 4.22–5.81)
RDW: 14.7 % (ref 11.5–15.5)
WBC: 6.4 10*3/uL (ref 4.0–10.5)
nRBC: 0 % (ref 0.0–0.2)

## 2023-08-19 LAB — GLUCOSE, CAPILLARY
Glucose-Capillary: 248 mg/dL — ABNORMAL HIGH (ref 70–99)
Glucose-Capillary: 187 mg/dL — ABNORMAL HIGH (ref 70–99)
Glucose-Capillary: 318 mg/dL — ABNORMAL HIGH (ref 70–99)
Glucose-Capillary: 233 mg/dL — ABNORMAL HIGH (ref 70–99)

## 2023-08-19 LAB — BASIC METABOLIC PANEL
Anion gap: 14 (ref 5–15)
BUN: 8 mg/dL (ref 8–23)
CO2: 20 mmol/L — ABNORMAL LOW (ref 22–32)
Calcium: 9.6 mg/dL (ref 8.9–10.3)
Chloride: 100 mmol/L (ref 98–111)
Creatinine, Ser: 0.93 mg/dL (ref 0.61–1.24)
GFR, Estimated: >60 mL/min (ref >60)
Glucose, Bld: 241 mg/dL — ABNORMAL HIGH (ref 70–99)
Potassium: 4.2 mmol/L (ref 3.5–5.1)
Sodium: 134 mmol/L — ABNORMAL LOW (ref 135–145)

## 2023-08-19 SURGERY — AMPUTATION, ABOVE KNEE
Anesthesia: Spinal | Site: Knee | Laterality: Left

## 2023-08-19 MED ORDER — FAMOTIDINE 20 MG PO TABS
20.0000 mg | ORAL_TABLET | Freq: Every day | ORAL | Status: DC
  Administered 2023-08-19 – 2023-08-24 (×6): 20 mg via ORAL
  Filled 2023-08-19 (×6): qty 1

## 2023-08-19 MED ORDER — CHLORHEXIDINE GLUCONATE 0.12 % MT SOLN
OROMUCOSAL | Status: AC
  Administered 2023-08-19: 15 mL via OROMUCOSAL
  Filled 2023-08-19: qty 15

## 2023-08-19 MED ORDER — PHENYLEPHRINE 80 MCG/ML (10ML) SYRINGE FOR IV PUSH (FOR BLOOD PRESSURE SUPPORT)
PREFILLED_SYRINGE | INTRAVENOUS | Status: DC | PRN
  Administered 2023-08-19 (×3): 100 ug via INTRAVENOUS

## 2023-08-19 MED ORDER — PANTOPRAZOLE SODIUM 40 MG PO TBEC: 40.0000 mg | DELAYED_RELEASE_TABLET | Freq: Every day | ORAL | Status: DC

## 2023-08-19 MED ORDER — CARVEDILOL 12.5 MG PO TABS
12.5000 mg | ORAL_TABLET | Freq: Two times a day (BID) | ORAL | Status: DC
  Administered 2023-08-19 – 2023-08-24 (×11): 12.5 mg via ORAL
  Filled 2023-08-19 (×12): qty 1

## 2023-08-19 MED ORDER — TAMSULOSIN HCL 0.4 MG PO CAPS
0.4000 mg | ORAL_CAPSULE | Freq: Every day | ORAL | Status: DC
  Administered 2023-08-19 – 2023-08-24 (×6): 0.4 mg via ORAL
  Filled 2023-08-19 (×6): qty 1

## 2023-08-19 MED ORDER — ONDANSETRON HCL 4 MG/2ML IJ SOLN: 4.0000 mg | Freq: Four times a day (QID) | INTRAMUSCULAR | Status: DC | PRN

## 2023-08-19 MED ORDER — GUAIFENESIN-DM 100-10 MG/5ML PO SYRP: 15.0000 mL | ORAL_SOLUTION | ORAL | Status: DC | PRN

## 2023-08-19 MED ORDER — POTASSIUM CHLORIDE CRYS ER 20 MEQ PO TBCR: 20.0000 meq | EXTENDED_RELEASE_TABLET | Freq: Every day | ORAL | Status: DC | PRN

## 2023-08-19 MED ORDER — DOCUSATE SODIUM 100 MG PO CAPS
100.0000 mg | ORAL_CAPSULE | Freq: Every day | ORAL | Status: DC
  Administered 2023-08-22 – 2023-08-24 (×3): 100 mg via ORAL
  Filled 2023-08-19 (×5): qty 1

## 2023-08-19 MED ORDER — GABAPENTIN 300 MG PO CAPS
600.0000 mg | ORAL_CAPSULE | Freq: Three times a day (TID) | ORAL | Status: DC
  Administered 2023-08-19 – 2023-08-24 (×15): 600 mg via ORAL
  Filled 2023-08-19 (×15): qty 2

## 2023-08-19 MED ORDER — INSULIN ASPART 100 UNIT/ML IJ SOLN
4.0000 [IU] | Freq: Three times a day (TID) | INTRAMUSCULAR | Status: DC
  Administered 2023-08-19 – 2023-08-24 (×14): 4 [IU] via SUBCUTANEOUS

## 2023-08-19 MED ORDER — CEFAZOLIN SODIUM-DEXTROSE 2-4 GM/100ML-% IV SOLN
2.0000 g | INTRAVENOUS | Status: DC
  Filled 2023-08-19: qty 100

## 2023-08-19 MED ORDER — ALBUMIN HUMAN 5 % IV SOLN: INTRAVENOUS | Status: DC | PRN

## 2023-08-19 MED ORDER — OXYCODONE HCL 5 MG PO TABS
10.0000 mg | ORAL_TABLET | ORAL | Status: DC | PRN
  Administered 2023-08-19 – 2023-08-24 (×15): 15 mg via ORAL
  Filled 2023-08-19 (×5): qty 3
  Filled 2023-08-19: qty 2
  Filled 2023-08-19 (×10): qty 3

## 2023-08-19 MED ORDER — PHENOL 1.4 % MT LIQD: 1.0000 | OROMUCOSAL | Status: DC | PRN

## 2023-08-19 MED ORDER — SODIUM CHLORIDE 0.9 % IV SOLN: INTRAVENOUS | Status: DC

## 2023-08-19 MED ORDER — LACTATED RINGERS IV SOLN: INTRAVENOUS | Status: DC

## 2023-08-19 MED ORDER — POLYETHYLENE GLYCOL 3350 17 G PO PACK
17.0000 g | PACK | Freq: Every day | ORAL | Status: DC | PRN
  Administered 2023-08-23 – 2023-08-24 (×2): 17 g via ORAL
  Filled 2023-08-19 (×2): qty 1

## 2023-08-19 MED ORDER — BISACODYL 5 MG PO TBEC
5.0000 mg | DELAYED_RELEASE_TABLET | Freq: Every day | ORAL | Status: DC | PRN
  Administered 2023-08-23 – 2023-08-24 (×2): 5 mg via ORAL
  Filled 2023-08-19 (×2): qty 1

## 2023-08-19 MED ORDER — MAGNESIUM CITRATE PO SOLN: 1.0000 | Freq: Once | ORAL | Status: DC | PRN

## 2023-08-19 MED ORDER — INSULIN GLARGINE-YFGN 100 UNIT/ML ~~LOC~~ SOLN
20.0000 [IU] | Freq: Every day | SUBCUTANEOUS | Status: DC
  Administered 2023-08-19 – 2023-08-23 (×5): 20 [IU] via SUBCUTANEOUS
  Filled 2023-08-19 (×6): qty 0.2

## 2023-08-19 MED ORDER — ZINC SULFATE 220 (50 ZN) MG PO CAPS
220.0000 mg | ORAL_CAPSULE | Freq: Every day | ORAL | Status: DC
  Administered 2023-08-19 – 2023-08-24 (×6): 220 mg via ORAL
  Filled 2023-08-19 (×6): qty 1

## 2023-08-19 MED ORDER — INSULIN ASPART 100 UNIT/ML IJ SOLN
INTRAMUSCULAR | Status: AC
  Administered 2023-08-19: 6 [IU] via SUBCUTANEOUS
  Filled 2023-08-19: qty 1

## 2023-08-19 MED ORDER — VITAMIN C 500 MG PO TABS
1000.0000 mg | ORAL_TABLET | Freq: Every day | ORAL | Status: DC
  Administered 2023-08-19 – 2023-08-24 (×6): 1000 mg via ORAL
  Filled 2023-08-19 (×7): qty 2

## 2023-08-19 MED ORDER — HYDRALAZINE HCL 20 MG/ML IJ SOLN
5.0000 mg | INTRAMUSCULAR | Status: DC | PRN
Start: 2023-08-19 — End: 2023-08-24

## 2023-08-19 MED ORDER — MAGNESIUM SULFATE 2 GM/50ML IV SOLN: 2.0000 g | Freq: Every day | INTRAVENOUS | Status: DC | PRN

## 2023-08-19 MED ORDER — EPHEDRINE SULFATE-NACL 50-0.9 MG/10ML-% IV SOSY
PREFILLED_SYRINGE | INTRAVENOUS | Status: DC | PRN
  Administered 2023-08-19 (×3): 5 mg via INTRAVENOUS
  Administered 2023-08-19: 10 mg via INTRAVENOUS

## 2023-08-19 MED ORDER — MIDAZOLAM HCL 2 MG/2ML IJ SOLN
INTRAMUSCULAR | Status: AC
  Filled 2023-08-19: qty 2

## 2023-08-19 MED ORDER — ALUM & MAG HYDROXIDE-SIMETH 200-200-20 MG/5ML PO SUSP: 15.0000 mL | ORAL | Status: DC | PRN

## 2023-08-19 MED ORDER — DEXAMETHASONE SODIUM PHOSPHATE 10 MG/ML IJ SOLN
INTRAMUSCULAR | Status: DC | PRN
  Administered 2023-08-19: 10 mg via INTRAVENOUS

## 2023-08-19 MED ORDER — BUPIVACAINE IN DEXTROSE 0.75-8.25 % IT SOLN
INTRATHECAL | Status: DC | PRN
  Administered 2023-08-19: 1.6 mL via INTRATHECAL

## 2023-08-19 MED ORDER — PHENYLEPHRINE 80 MCG/ML (10ML) SYRINGE FOR IV PUSH (FOR BLOOD PRESSURE SUPPORT)
PREFILLED_SYRINGE | INTRAVENOUS | Status: AC
  Filled 2023-08-19: qty 10

## 2023-08-19 MED ORDER — PROPOFOL 500 MG/50ML IV EMUL
INTRAVENOUS | Status: DC | PRN
  Administered 2023-08-19: 25 ug/kg/min via INTRAVENOUS

## 2023-08-19 MED ORDER — ATORVASTATIN CALCIUM 10 MG PO TABS
20.0000 mg | ORAL_TABLET | Freq: Every morning | ORAL | Status: DC
  Administered 2023-08-20 – 2023-08-24 (×5): 20 mg via ORAL
  Filled 2023-08-19 (×6): qty 2

## 2023-08-19 MED ORDER — ASPIRIN 81 MG PO TBEC
81.0000 mg | DELAYED_RELEASE_TABLET | Freq: Every day | ORAL | Status: DC
  Administered 2023-08-19 – 2023-08-24 (×6): 81 mg via ORAL
  Filled 2023-08-19 (×6): qty 1

## 2023-08-19 MED ORDER — INSULIN ASPART 100 UNIT/ML IJ SOLN
0.0000 [IU] | Freq: Three times a day (TID) | INTRAMUSCULAR | Status: DC
  Administered 2023-08-19 – 2023-08-20 (×2): 11 [IU] via SUBCUTANEOUS
  Administered 2023-08-20 (×2): 8 [IU] via SUBCUTANEOUS
  Administered 2023-08-21 (×2): 3 [IU] via SUBCUTANEOUS
  Administered 2023-08-21 – 2023-08-22 (×2): 5 [IU] via SUBCUTANEOUS
  Administered 2023-08-22: 3 [IU] via SUBCUTANEOUS
  Administered 2023-08-22: 5 [IU] via SUBCUTANEOUS
  Administered 2023-08-23: 8 [IU] via SUBCUTANEOUS
  Administered 2023-08-23 (×2): 5 [IU] via SUBCUTANEOUS
  Administered 2023-08-24: 3 [IU] via SUBCUTANEOUS

## 2023-08-19 MED ORDER — ISOSORBIDE MONONITRATE ER 30 MG PO TB24
30.0000 mg | ORAL_TABLET | Freq: Every day | ORAL | Status: DC
  Administered 2023-08-20 – 2023-08-24 (×5): 30 mg via ORAL
  Filled 2023-08-19 (×5): qty 1

## 2023-08-19 MED ORDER — PANTOPRAZOLE SODIUM 40 MG PO TBEC
40.0000 mg | DELAYED_RELEASE_TABLET | Freq: Every day | ORAL | Status: DC
  Administered 2023-08-19 – 2023-08-24 (×6): 40 mg via ORAL
  Filled 2023-08-19 (×6): qty 1

## 2023-08-19 MED ORDER — OXYCODONE HCL 5 MG PO TABS: 5.0000 mg | ORAL_TABLET | ORAL | Status: DC | PRN

## 2023-08-19 MED ORDER — EPHEDRINE 5 MG/ML INJ
INTRAVENOUS | Status: AC
  Filled 2023-08-19: qty 5

## 2023-08-19 MED ORDER — PHENYLEPHRINE HCL-NACL 20-0.9 MG/250ML-% IV SOLN
INTRAVENOUS | Status: DC | PRN
  Administered 2023-08-19: 50 ug/min via INTRAVENOUS

## 2023-08-19 MED ORDER — JUVEN PO PACK
1.0000 | PACK | Freq: Two times a day (BID) | ORAL | Status: DC
  Administered 2023-08-19 – 2023-08-22 (×6): 1 via ORAL
  Filled 2023-08-19 (×7): qty 1

## 2023-08-19 MED ORDER — ZOLPIDEM TARTRATE 5 MG PO TABS
10.0000 mg | ORAL_TABLET | Freq: Every day | ORAL | Status: DC
  Administered 2023-08-19 – 2023-08-23 (×5): 10 mg via ORAL
  Filled 2023-08-19 (×5): qty 2

## 2023-08-19 MED ORDER — ORAL CARE MOUTH RINSE: 15.0000 mL | Freq: Once | OROMUCOSAL | Status: AC

## 2023-08-19 MED ORDER — METOPROLOL TARTRATE 5 MG/5ML IV SOLN: 2.0000 mg | INTRAVENOUS | Status: DC | PRN

## 2023-08-19 MED ORDER — FENTANYL CITRATE (PF) 250 MCG/5ML IJ SOLN
INTRAMUSCULAR | Status: AC
  Filled 2023-08-19: qty 5

## 2023-08-19 MED ORDER — LIDOCAINE 2% (20 MG/ML) 5 ML SYRINGE
INTRAMUSCULAR | Status: DC | PRN
  Administered 2023-08-19: 60 mg via INTRAVENOUS

## 2023-08-19 MED ORDER — CHLORHEXIDINE GLUCONATE 0.12 % MT SOLN: 15.0000 mL | Freq: Once | OROMUCOSAL | Status: AC

## 2023-08-19 MED ORDER — INSULIN ASPART 100 UNIT/ML IJ SOLN
0.0000 [IU] | INTRAMUSCULAR | Status: DC | PRN
  Filled 2023-08-19: qty 1

## 2023-08-19 MED ORDER — TRANEXAMIC ACID-NACL 1000-0.7 MG/100ML-% IV SOLN
1000.0000 mg | INTRAVENOUS | Status: DC
  Filled 2023-08-19: qty 100

## 2023-08-19 MED ORDER — LABETALOL HCL 5 MG/ML IV SOLN
10.0000 mg | INTRAVENOUS | Status: DC | PRN
Start: 2023-08-19 — End: 2023-08-24

## 2023-08-19 MED ORDER — POTASSIUM CHLORIDE CRYS ER 20 MEQ PO TBCR
20.0000 meq | EXTENDED_RELEASE_TABLET | Freq: Every day | ORAL | Status: DC
Start: 2023-08-19 — End: 2023-08-24
  Administered 2023-08-19 – 2023-08-24 (×6): 20 meq via ORAL
  Filled 2023-08-19 (×6): qty 1

## 2023-08-19 MED ORDER — ACETAMINOPHEN 325 MG PO TABS
325.0000 mg | ORAL_TABLET | Freq: Four times a day (QID) | ORAL | Status: DC | PRN
  Administered 2023-08-23: 650 mg via ORAL
  Filled 2023-08-19: qty 2

## 2023-08-19 MED ORDER — MIDAZOLAM HCL 2 MG/2ML IJ SOLN
INTRAMUSCULAR | Status: DC | PRN
  Administered 2023-08-19: 2 mg via INTRAVENOUS

## 2023-08-19 MED ORDER — HYDROMORPHONE HCL 1 MG/ML IJ SOLN
0.5000 mg | INTRAMUSCULAR | Status: DC | PRN
  Administered 2023-08-19: 1 mg via INTRAVENOUS
  Filled 2023-08-19: qty 1

## 2023-08-19 MED ORDER — DEXMEDETOMIDINE HCL IN NACL 80 MCG/20ML IV SOLN
INTRAVENOUS | Status: AC
  Filled 2023-08-19: qty 20

## 2023-08-19 MED ORDER — CEFAZOLIN SODIUM-DEXTROSE 2-4 GM/100ML-% IV SOLN
2.0000 g | Freq: Three times a day (TID) | INTRAVENOUS | Status: AC
  Administered 2023-08-19 (×2): 2 g via INTRAVENOUS
  Filled 2023-08-19 (×2): qty 100

## 2023-08-19 MED ORDER — FUROSEMIDE 20 MG PO TABS
20.0000 mg | ORAL_TABLET | Freq: Every day | ORAL | Status: DC
  Administered 2023-08-19 – 2023-08-24 (×6): 20 mg via ORAL
  Filled 2023-08-19 (×6): qty 1

## 2023-08-19 MED ORDER — HYDROMORPHONE HCL 1 MG/ML IJ SOLN
0.5000 mg | INTRAMUSCULAR | Status: DC | PRN
Start: 2023-08-19 — End: 2023-08-24
  Administered 2023-08-19 – 2023-08-24 (×23): 1 mg via INTRAVENOUS
  Filled 2023-08-19 (×24): qty 1

## 2023-08-19 MED ORDER — CARVEDILOL 12.5 MG PO TABS
12.5000 mg | ORAL_TABLET | Freq: Once | ORAL | Status: DC
  Filled 2023-08-19: qty 1

## 2023-08-19 MED ORDER — ONDANSETRON HCL 4 MG/2ML IJ SOLN
INTRAMUSCULAR | Status: DC | PRN
  Administered 2023-08-19: 4 mg via INTRAVENOUS

## 2023-08-19 SURGICAL SUPPLY — 37 items
BAG COUNTER SPONGE SURGICOUNT (BAG) IMPLANT
BLADE SAW RECIP 87.9 MT (BLADE) ×1 IMPLANT
BLADE SURG 21 STRL SS (BLADE) ×1 IMPLANT
BNDG COHESIVE 6X5 TAN ST LF (GAUZE/BANDAGES/DRESSINGS) ×1 IMPLANT
CANISTER WOUND CARE 500ML ATS (WOUND CARE) IMPLANT
COVER SURGICAL LIGHT HANDLE (MISCELLANEOUS) ×1 IMPLANT
CUFF TRNQT CYL 34X4.125X (TOURNIQUET CUFF) IMPLANT
DRAPE DERMATAC (DRAPES) IMPLANT
DRAPE INCISE IOBAN 66X45 STRL (DRAPES) ×2 IMPLANT
DRAPE U-SHAPE 47X51 STRL (DRAPES) ×1 IMPLANT
DRESSING PREVENA PLUS CUSTOM (GAUZE/BANDAGES/DRESSINGS) ×1 IMPLANT
DRSG PREVENA PLUS CUSTOM (GAUZE/BANDAGES/DRESSINGS) ×1
DURAPREP 26ML APPLICATOR (WOUND CARE) ×1 IMPLANT
ELECT REM PT RETURN 9FT ADLT (ELECTROSURGICAL) ×1
ELECTRODE REM PT RTRN 9FT ADLT (ELECTROSURGICAL) ×1 IMPLANT
GLOVE BIOGEL PI IND STRL 7.5 (GLOVE) ×1 IMPLANT
GLOVE BIOGEL PI IND STRL 9 (GLOVE) ×1 IMPLANT
GLOVE SURG ORTHO 9.0 STRL STRW (GLOVE) ×1 IMPLANT
GLOVE SURG SS PI 6.5 STRL IVOR (GLOVE) ×1 IMPLANT
GOWN STRL REUS W/ TWL LRG LVL3 (GOWN DISPOSABLE) ×1 IMPLANT
GOWN STRL REUS W/ TWL XL LVL3 (GOWN DISPOSABLE) ×2 IMPLANT
GRAFT SKIN WND MICRO 38 (Tissue) IMPLANT
KIT BASIN OR (CUSTOM PROCEDURE TRAY) ×1 IMPLANT
KIT TURNOVER KIT B (KITS) ×1 IMPLANT
MANIFOLD NEPTUNE II (INSTRUMENTS) ×1 IMPLANT
NS IRRIG 1000ML POUR BTL (IV SOLUTION) ×1 IMPLANT
PACK ORTHO EXTREMITY (CUSTOM PROCEDURE TRAY) ×1 IMPLANT
PAD ARMBOARD 7.5X6 YLW CONV (MISCELLANEOUS) ×1 IMPLANT
PREVENA RESTOR ARTHOFORM 46X30 (CANNISTER) ×1 IMPLANT
STAPLER VISISTAT 35W (STAPLE) IMPLANT
STOCKINETTE IMPERVIOUS LG (DRAPES) IMPLANT
SUT ETHILON 2 0 PSLX (SUTURE) ×2 IMPLANT
SUT SILK 2-0 18XBRD TIE 12 (SUTURE) ×1 IMPLANT
SUT VIC AB 1 CTX36XBRD ANBCTR (SUTURE) IMPLANT
TOWEL GREEN STERILE FF (TOWEL DISPOSABLE) ×1 IMPLANT
TUBE CONNECTING 20X1/4 (TUBING) ×1 IMPLANT
YANKAUER SUCT BULB TIP NO VENT (SUCTIONS) ×1 IMPLANT

## 2023-08-19 NOTE — Anesthesia Procedure Notes (Signed)
 Spinal  Patient location during procedure: OR Start time: 08/19/2023 10:16 AM End time: 08/19/2023 10:21 AM Reason for block: surgical anesthesia Staffing Performed: anesthesiologist  Anesthesiologist: Epifanio Fallow, MD Performed by: Epifanio Fallow, MD Authorized by: Epifanio Fallow, MD   Preanesthetic Checklist Completed: patient identified, IV checked, risks and benefits discussed, surgical consent, monitors and equipment checked, pre-op evaluation and timeout performed Spinal Block Patient position: sitting Prep: DuraPrep Patient monitoring: cardiac monitor, continuous pulse ox and blood pressure Approach: midline Location: L3-4 Injection technique: single-shot Needle Needle type: Pencan  Needle gauge: 24 G Needle length: 9 cm Assessment Sensory level: T8 Events: CSF return Additional Notes Functioning IV was confirmed and monitors were applied. Sterile prep and drape, including hand hygiene and sterile gloves were used. The patient was positioned and the spine was prepped. The skin was anesthetized with lidocaine .  Free flow of clear CSF was obtained prior to injecting local anesthetic into the CSF.  The spinal needle aspirated freely following injection.  The needle was carefully withdrawn.  The patient tolerated the procedure well.

## 2023-08-19 NOTE — Consult Note (Signed)
 WOC consulted for wound care on the right foot, verified with Dr. Lajoyce Corners who sees this patient outpatient these are callous, no need for topical care inpatient.   Will DC consult Michelina Mexicano [facility], CNS, CWON-AP [phone]

## 2023-08-19 NOTE — Interval H&P Note (Signed)
 History and Physical Interval Note:  08/19/2023 9:02 AM  Mike Floyd  has presented today for surgery, with the diagnosis of Dehiscence Left Below Knee Amputation.  The various methods of treatment have been discussed with the patient and family. After consideration of risks, benefits and other options for treatment, the patient has consented to  Procedure(s): LEFT ABOVE KNEE AMPUTATION (Left) as a surgical intervention.  The patient's history has been reviewed, patient examined, no change in status, stable for surgery.  I have reviewed the patient's chart and labs.  Questions were answered to the patient's satisfaction.     Seanne Chirico V Brylynn Hanssen

## 2023-08-19 NOTE — Transfer of Care (Signed)
 Immediate Anesthesia Transfer of Care Note  Patient: Mike Floyd  Procedure(s) Performed: LEFT ABOVE KNEE AMPUTATION (Left: Knee)  Patient Location: PACU  Anesthesia Type:MAC and Spinal  Level of Consciousness: awake, alert , and oriented  Airway & Oxygen Therapy: Patient Spontanous Breathing  Post-op Assessment: Report given to RN and Post -op Vital signs reviewed and stable  Post vital signs: Reviewed and stable  Last Vitals:  Vitals Value Taken Time  BP 94/78 08/19/23 1108  Temp    Pulse 76 08/19/23 1109  Resp 17 08/19/23 1109  SpO2 95 % 08/19/23 1109  Vitals shown include unfiled device data.  Last Pain:  Vitals:   08/19/23 0741  PainSc: 4          Complications: No notable events documented.

## 2023-08-19 NOTE — H&P (Signed)
 Mike Floyd is an 63 y.o. male.   Chief Complaint: Purulent drainage from revised right below-knee amputation. HPI: The patient is a 63 year old gentleman who is seen status post revision below-knee amputation. Has had some increasing pain especially to the medial aspect of his right below-knee amputation   Past Medical History:  Diagnosis Date   AICD (automatic cardioverter/defibrillator) present 11/2007   Removed in 2018, a. 11/2007 S[facility] Current VR - single lead ICD  - Removed 2018 - it was burning me   Anxiety    CAD (coronary artery disease)    non-obstructive CAD by Cor CT in 2020 // Myoview  3/22: EF 57, small inf-sept defect c/w scar, no ischemia; low risk      Chest pain    a. 10/2007 Cath:  normal Cors.   CKD (chronic kidney disease), stage II    DDD (degenerative disc disease), lumbar    Diabetes mellitus DX: 2010   type 2   Erosive esophagitis    a. per EGD (08/2011), Dr. Golda - Erosive reflux esophagitis improved but not completely healed since previous EGD 3 years ago. Bx showing  ulcerated gatroesophageal junction mucosa. negative for H. pylori   GERD (gastroesophageal reflux disease)    Gout    Hearing deficit    a. wear bilateral hearing aides   History of hiatal hernia    History of kidney stones    passed stones, no surgery   Hypertension    Mildly dilatd aortic root (HCC)    CMR 4/22: EF 52, no LGE; d/w Dr. Ione root 38 mm (mildly dilated)   Myocardial infarction [facility]) 2011   Neuropathy    Feet and legs   Nonischemic dilated cardiomyopathy (HCC)    a. H/O EF as low as 35-40% by LV gram 10/2007;  b. Echo 02/2011 EF 50-55%, inf HK, Gr 1 DD // CMR 4/22: EF 52, no LGE; d/w Dr. Ione root 38 mm (mildly dilated)    Renal insufficiency    Sleep apnea    pt doesnt use, statesI cant afford one. PCP aware   Stroke (HCC)    mini-stroke in 2014   TIA (transient ischemic attack)    July, 2013   Tobacco abuse, in remission 06/27/2009   Discontinued in 2009      Wears dentures    top plate   WPW (Wolff-Parkinson-White syndrome)    a. s/p RFCA @ [facility] - 1999    Past Surgical History:  Procedure Laterality Date   AMPUTATION Left 05/19/2022   Procedure: TRANSMETATARSAL AMPUTATION LEFT FOOT;  Surgeon: Harden Jerona GAILS, MD;  Location: [facility] OR;  Service: Orthopedics;  Laterality: Left;   AMPUTATION Left 06/17/2023   Procedure: LEFT BELOW KNEE AMPUTATION;  Surgeon: Harden Jerona GAILS, MD;  Location: [facility] OR;  Service: Orthopedics;  Laterality: Left;   APPENDECTOMY     BACK SURGERY     1995   CARDIAC CATHETERIZATION     2009   CARDIAC DEFIBRILLATOR PLACEMENT     ICD was removed in 2018   CHOLECYSTECTOMY     CHOLECYSTECTOMY, LAPAROSCOPIC     11/2007   COLONOSCOPY W/ POLYPECTOMY  2009   ELBOW SURGERY Left 06/2010   ESOPHAGEAL DILATION N/A 11/08/2014   Procedure: ESOPHAGEAL DILATION;  Surgeon: Claudis RAYMOND Golda, MD;  Location: [facility] ORS;  Service: Endoscopy;  Laterality: N/A;  #56,    ESOPHAGOGASTRODUODENOSCOPY  03/31/2012   also 08/2011; [name]   ESOPHAGOGASTRODUODENOSCOPY (EGD) WITH PROPOFOL  N/A 11/08/2014   Procedure: ESOPHAGOGASTRODUODENOSCOPY (EGD)  WITH PROPOFOL ;  Surgeon: Claudis RAYMOND Rivet, MD;  Location: [facility] ORS;  Service: Endoscopy;  Laterality: N/A;  Hiatus is 34 , GE Junction is 37   FOOT ARTHRODESIS Left 10/24/2020   Procedure: LEFT GASTROCNEMIUS RECESSION, DORSIFLEXION OSTEOTOMY 1ST MT;  Surgeon: Harden Jerona GAILS, MD;  Location: [facility] OR;  Service: Orthopedics;  Laterality: Left;   FOOT ARTHRODESIS Right 01/09/2021   Procedure: CLOSING WEDGE OSTEOTOMY RIGHT 1ST METATARSAL;  Surgeon: Harden Jerona GAILS, MD;  Location: [facility] OR;  Service: Orthopedics;  Laterality: Right;   GASTROCNEMIUS RECESSION Right 01/09/2021   Procedure: RIGHT GASTROCNEMIUS RECESSION;  Surgeon: Harden Jerona GAILS, MD;  Location: [facility] OR;  Service: Orthopedics;  Laterality: Right;   ICD LEAD REMOVAL N/A 10/07/2016   Procedure: ICD LEAD REMOVAL ;  Surgeon: Danelle LELON Birmingham, MD;  Location: [facility] OR;   Service: Cardiovascular;  Laterality: N/A;   MULTIPLE TOOTH EXTRACTIONS     RADIOFREQUENCY ABLATION  1999   WPW; performed at [facility]   STUMP REVISION Left 07/22/2023   Procedure: REVISION LEFT BELOW KNEE AMPUTATION;  Surgeon: Harden Jerona GAILS, MD;  Location: [facility] OR;  Service: Orthopedics;  Laterality: Left;   TEE WITHOUT CARDIOVERSION N/A 10/07/2016   Procedure: TRANSESOPHAGEAL ECHOCARDIOGRAM (TEE);  Surgeon: Danelle LELON Birmingham, MD;  Location: [facility] OR;  Service: Cardiovascular;  Laterality: N/A;    Family History  Problem Relation Age of Onset   Heart attack Mother 66   Hypertension Mother    Diabetes Mother    Kidney disease Mother    Breast cancer Mother    Heart attack Father 55   Hypertension Father    Diabetes Father    Stroke Brother    Heart attack Brother 65   Stroke Maternal Grandmother 53   Diabetes Brother    Hypertension Brother    Social History:  reports that he quit smoking about 13 years ago. His smoking use included cigarettes. He started smoking about 52 years ago. He has a 58.6 pack-year smoking history. He quit smokeless tobacco use about 29 years ago.  His smokeless tobacco use included chew. He reports that he does not drink alcohol and does not use drugs.  Allergies: No Known Allergies  No medications prior to admission.    No results found for this or any previous visit (from the past 48 hours). No results found.  Review of Systems  All other systems reviewed and are negative.   Height 6' 1 (1.854 m), weight 87.5 kg. Physical Exam  Patient is alert, oriented, no adenopathy, well-dressed, normal affect, normal respiratory effort. On examination left residual limb there is dehiscence of the incision medially with purulent drainage, copious.  There is no ascending cellulitis Assessment/Plan Assessment: Dehiscence with purulent drainage revised left below-knee amputation.  Plan: Patient does not have transtibial amputation revision options.  The  residual limb is too short to revise.  Will plan for above-the-knee amputation.  Risk and benefits were discussed including infection neurovascular injury nonhealing of the wound and need for additional surgery.  Patient states he understands wished to proceed at this time.  Jerona GAILS Harden, MD 08/19/2023, 7:00 AM

## 2023-08-19 NOTE — Anesthesia Postprocedure Evaluation (Signed)
 Anesthesia Post Note  Patient: Mike Floyd  Procedure(s) Performed: LEFT ABOVE KNEE AMPUTATION (Left: Knee)     Patient location during evaluation: PACU Anesthesia Type: Spinal Level of consciousness: oriented and awake and alert Pain management: pain level controlled Vital Signs Assessment: post-procedure vital signs reviewed and stable Respiratory status: spontaneous breathing and respiratory function stable Cardiovascular status: blood pressure returned to baseline and stable Postop Assessment: no headache, no backache, no apparent nausea or vomiting, spinal receding and patient able to bend at knees Anesthetic complications: no   No notable events documented.                 Rorie Delmore,W. EDMOND

## 2023-08-19 NOTE — Op Note (Signed)
 08/19/2023  11:07 AM  PATIENT:  Mike Floyd    PRE-OPERATIVE DIAGNOSIS:  Dehiscence Left Below Knee Amputation  POST-OPERATIVE DIAGNOSIS:  Same  PROCEDURE:  LEFT ABOVE KNEE AMPUTATION Application Kerecis micro graft 38 cm. Application of customizable wound VAC.  SURGEON:  Jerona LULLA Sage, MD  PHYSICIAN ASSISTANT:None ANESTHESIA:   General  PREOPERATIVE INDICATIONS:  [name] is a  63 y.o. male with a diagnosis of Dehiscence Left Below Knee Amputation who failed conservative measures and elected for surgical management.    The risks benefits and alternatives were discussed with the patient preoperatively including but not limited to the risks of infection, bleeding, nerve injury, cardiopulmonary complications, the need for revision surgery, among others, and the patient was willing to proceed.  OPERATIVE IMPLANTS:   Implant Name Type Inv. Item Serial No. Manufacturer Lot No. LRB No. Used Action  GRAFT SKIN WND MICRO 38 - [id number] Tissue GRAFT SKIN WND MICRO 38  [employer] [id number] Left 1 Implanted    @ENCIMAGES @  OPERATIVE FINDINGS: Muscle had good contractility color and consistency.  No signs of abscess.  OPERATIVE PROCEDURE: Patient was brought the operating room underwent a spinal anesthetic.  After AacuLose anesthesia obtained patient's left lower extremity was prepped using DuraPrep draped into a sterile field.  The purulent dehisced below-knee amputation was draped out of the sterile field with Ioban and a impervious stockinette.  A fishmouth incision was made just proximal to the patella this was carried down extra-articular to the femur.  The vascular bundle medially was suture-ligated with 2-0 silk.  The soft tissue amputation was completed and the bone was transected with a reciprocating saw.  Electrocautery was used for further hemostasis.  The wound was irrigated with Vashe.  The deep and superficial fascial layers were closed using #1 Vicryl the skin was  closed using staples.  The Prevena customizable wound VAC was applied this had a good suction fit patient was taken the PACU in stable condition   DISCHARGE PLANNING:  Antibiotic duration: 24 hours of antibiotics  Weightbearing: Nonweightbearing on the left  Pain medication: Opioid pathway  Dressing care/ Wound VAC: Continue wound VAC for 1 week  Ambulatory devices: Walker  Discharge to: Discharge planning based on therapy recommendations.  Follow-up: In the office 1 week post operative.

## 2023-08-19 NOTE — Plan of Care (Signed)
  Problem: Education: Goal: Knowledge of General Education information will improve Description: Including pain rating scale, medication(s)/side effects and non-pharmacologic comfort measures Outcome: Progressing   Problem: Clinical Measurements: Goal: Will remain free from infection Outcome: Progressing   Problem: Activity: Goal: Risk for activity intolerance will decrease Outcome: Progressing   Problem: Nutrition: Goal: Adequate nutrition will be maintained Outcome: Progressing   Problem: Elimination: Goal: Will not experience complications related to bowel motility Outcome: Progressing   Problem: Pain Management: Goal: General experience of comfort will improve Outcome: Progressing

## 2023-08-20 ENCOUNTER — Encounter (HOSPITAL_COMMUNITY): Payer: Self-pay | Admitting: Orthopedic Surgery

## 2023-08-20 LAB — GLUCOSE, CAPILLARY
Glucose-Capillary: 292 mg/dL — ABNORMAL HIGH (ref 70–99)
Glucose-Capillary: 316 mg/dL — ABNORMAL HIGH (ref 70–99)
Glucose-Capillary: 253 mg/dL — ABNORMAL HIGH (ref 70–99)
Glucose-Capillary: 199 mg/dL — ABNORMAL HIGH (ref 70–99)

## 2023-08-20 NOTE — Progress Notes (Signed)
 Patient ID: Mike Floyd, male   DOB: 1961/02/27, 63 y.o.   MRN: [id number] Patient out of bed to a chair. VAC without drainage. Ideally patient could benefit from CIR. Patient's chronic use of narcotics will make pain control difficult

## 2023-08-20 NOTE — Plan of Care (Signed)
  Problem: Activity: Goal: Risk for activity intolerance will decrease Outcome: Progressing   Problem: Nutrition: Goal: Adequate nutrition will be maintained Outcome: Progressing   Problem: Coping: Goal: Level of anxiety will decrease Outcome: Progressing   Problem: Elimination: Goal: Will not experience complications related to bowel motility Outcome: Progressing Goal: Will not experience complications related to urinary retention Outcome: Progressing   Problem: Pain Management: Goal: General experience of comfort will improve Outcome: Progressing   Problem: Safety: Goal: Ability to remain free from injury will improve Outcome: Progressing   Problem: Skin Integrity: Goal: Risk for impaired skin integrity will decrease Outcome: Progressing   Problem: Education: Goal: Ability to describe self-care measures that may prevent or decrease complications (Diabetes Survival Skills Education) will improve Outcome: Progressing Goal: Individualized Educational Video(s) Outcome: Progressing   Problem: Coping: Goal: Ability to adjust to condition or change in health will improve Outcome: Progressing   Problem: Fluid Volume: Goal: Ability to maintain a balanced intake and output will improve Outcome: Progressing   Problem: Health Behavior/Discharge Planning: Goal: Ability to identify and utilize available resources and services will improve Outcome: Progressing Goal: Ability to manage health-related needs will improve Outcome: Progressing   Problem: Metabolic: Goal: Ability to maintain appropriate glucose levels will improve Outcome: Progressing   Problem: Nutritional: Goal: Maintenance of adequate nutrition will improve Outcome: Progressing Goal: Progress toward achieving an optimal weight will improve Outcome: Progressing   Problem: Skin Integrity: Goal: Risk for impaired skin integrity will decrease Outcome: Progressing   Problem: Tissue Perfusion: Goal: Adequacy  of tissue perfusion will improve Outcome: Progressing   Problem: Education: Goal: Knowledge of the prescribed therapeutic regimen will improve Outcome: Progressing Goal: Ability to verbalize activity precautions or restrictions will improve Outcome: Progressing Goal: Understanding of discharge needs will improve Outcome: Progressing   Problem: Activity: Goal: Ability to perform//tolerate increased activity and mobilize with assistive devices will improve Outcome: Progressing   Problem: Clinical Measurements: Goal: Postoperative complications will be avoided or minimized Outcome: Progressing   Problem: Self-Care: Goal: Ability to meet self-care needs will improve Outcome: Progressing   Problem: Self-Concept: Goal: Ability to maintain and perform role responsibilities to the fullest extent possible will improve Outcome: Progressing   Problem: Pain Management: Goal: Pain level will decrease with appropriate interventions Outcome: Progressing

## 2023-08-20 NOTE — Evaluation (Signed)
 Occupational Therapy Evaluation Patient Details Name: Mike Floyd MRN: [id number] DOB: January 19, 1961 Today's Date: 08/20/2023   History of Present Illness Pt is a 63 y/o male admitted to [facility] 08/19/23 for  dehiscence of L BKA stump. S/p revision of L BKA to AKA 08/19/23. PMHx: poorly controlled type 2 diabetes, GERD, hypertension, dyslipidemia, CAD, L BKA 06/17/23, and neuropathy.   Clinical Impression   PTA, pt was living at home with his wife and reports he was requiring assistance with ADL and functional mobility. Pt currently limited this session secondary to pain, reports 5/10 at rest and 7/10 with activity. Pt requires minA for stand pivot transfer from EOB<>recliner, setup assistance for UB ADL while seated and contact guard assistance for LB dressing. Patient will benefit from intensive inpatient follow up therapy, >3 hours/day. Will continue to follow acutely and progress appropriately.        If plan is discharge home, recommend the following: A little help with walking and/or transfers;A little help with bathing/dressing/bathroom    Functional Status Assessment  Patient has had a recent decline in their functional status and demonstrates the ability to make significant improvements in function in a reasonable and predictable amount of time.  Equipment Recommendations  Other (comment) (tbd)    Recommendations for Other Services       Precautions / Restrictions Precautions Precautions: Fall Restrictions Weight Bearing Restrictions Per Provider Order: Yes LLE Weight Bearing Per Provider Order: Non weight bearing Other Position/Activity Restrictions: Left AKA      Mobility Bed Mobility Overal bed mobility: Independent                  Transfers Overall transfer level: Needs assistance Equipment used: Rolling walker (2 wheels) Transfers: Sit to/from Stand, Bed to chair/wheelchair/BSC Sit to Stand: Contact guard assist Stand pivot transfers: Contact guard assist          General transfer comment: limited due to pain      Balance Overall balance assessment: Needs assistance Sitting-balance support: No upper extremity supported, Feet supported Sitting balance-Leahy Scale: Good     Standing balance support: Bilateral upper extremity supported, During functional activity, Reliant on assistive device for balance Standing balance-Leahy Scale: Poor                             ADL either performed or assessed with clinical judgement   ADL Overall ADL's : Needs assistance/impaired Eating/Feeding: Set up;Sitting   Grooming: Set up;Sitting   Upper Body Bathing: Set up;Sitting   Lower Body Bathing: Contact guard assist;Sit to/from stand   Upper Body Dressing : Set up;Sitting   Lower Body Dressing: Contact guard assist;Sit to/from stand   Toilet Transfer: Minimal assistance;Stand-pivot;Rolling walker (2 wheels) Toilet Transfer Details (indicate cue type and reason): simulated with stand pivot from EOB to recliner Toileting- Clothing Manipulation and Hygiene: Minimal assistance;Sit to/from stand       Functional mobility during ADLs: Minimal assistance;Rolling walker (2 wheels) General ADL Comments: pt limited by pain, pt shouting out in pain wiht mobility, reports 7/10 pain level (5/10 at rest)     Vision         Perception         Praxis         Pertinent Vitals/Pain Pain Assessment Pain Assessment: 0-10 Pain Score: 5  Pain Location: left residual limb Pain Descriptors / Indicators: Constant, Crushing, Moaning, Operative site guarding, Grimacing Pain Intervention(s): Limited activity within patient's tolerance,  Monitored during session     Extremity/Trunk Assessment Upper Extremity Assessment Upper Extremity Assessment: Overall WFL for tasks assessed   Lower Extremity Assessment Lower Extremity Assessment: Defer to PT evaluation       Communication Communication Communication: Hearing impairment    Cognition Arousal: Alert Behavior During Therapy: WFL for tasks assessed/performed Overall Cognitive Status: Within Functional Limits for tasks assessed                                       General Comments  vss, wound vac intact    Exercises     Shoulder Instructions      Home Living Family/patient expects to be discharged to:: Private residence Living Arrangements: Spouse/significant other Available Help at Discharge: Family;Available 24 hours/day Type of Home: Mobile home Home Access: Stairs to enter Entrance Stairs-Number of Steps: 3 Entrance Stairs-Rails: Right;Can reach both;Left Home Layout: One level     Bathroom Shower/Tub: Producer, Television/film/video: Standard Bathroom Accessibility: No   Home Equipment: Teacher, English As A Foreign Language (2 wheels);Crutches;Cane - single point;Wheelchair - manual   Additional Comments: wife is home at all time but helps with mother that has dementia. wife does not drive. daughter does all the necessary driving      Prior Functioning/Environment Prior Level of Function : Needs assist       Physical Assist : Mobility (physical);ADLs (physical) Mobility (physical): Transfers;Gait;Stairs   Mobility Comments: ambulates w/ RW and wife holding onto him ADLs Comments: assist from wife wiht ADL, pt has been sponge bathing, reports his plan is to get a prosthetic eventually        OT Problem List: Decreased activity tolerance;Impaired balance (sitting and/or standing);Decreased safety awareness;Pain      OT Treatment/Interventions: Self-care/ADL training;Therapeutic exercise;DME and/or AE instruction;Therapeutic activities;Balance training;Patient/family education    OT Goals(Current goals can be found in the care plan section) Acute Rehab OT Goals Patient Stated Goal: to get a prosthetic OT Goal Formulation: With patient Time For Goal Achievement: 09/03/23 Potential to Achieve Goals: Good ADL Goals Pt Will  Perform Lower Body Dressing: with modified independence;sit to/from stand Pt Will Transfer to Toilet: with modified independence;ambulating Additional ADL Goal #1: Pt will demonstrate independence with desensitization strategies of LLE.  OT Frequency: Min 2X/week    Co-evaluation              AM-PAC OT 6 Clicks Daily Activity     Outcome Measure Help from another person eating meals?: None Help from another person taking care of personal grooming?: A Little Help from another person toileting, which includes using toliet, bedpan, or urinal?: A Little Help from another person bathing (including washing, rinsing, drying)?: A Little Help from another person to put on and taking off regular upper body clothing?: A Little Help from another person to put on and taking off regular lower body clothing?: A Little 6 Click Score: 19   End of Session Equipment Utilized During Treatment: Rolling walker (2 wheels) Nurse Communication: Mobility status  Activity Tolerance: Patient limited by pain Patient left: in bed;with call bell/phone within reach;with bed alarm set  OT Visit Diagnosis: Unsteadiness on feet (R26.81);Muscle weakness (generalized) (M62.81);Pain Pain - Right/Left: Left Pain - part of body: Leg                Time: 8574-8561 OT Time Calculation (min): 13 min Charges:  OT General Charges $OT Visit: 1 Visit OT  Evaluation $OT Eval Moderate Complexity: 1 Mod  Rehema Muffley OTR/L Acute Rehabilitation Services Office: [phone]   Verneita ONEIDA Moose 08/20/2023, 3:04 PM

## 2023-08-20 NOTE — Evaluation (Signed)
 Physical Therapy Evaluation Patient Details Name: Mike Floyd MRN: [id number] DOB: 04/06/1961 Today's Date: 08/20/2023  History of Present Illness  Pt is a 63 y/o male admitted to [facility] 08/19/23 for  dehiscence of L BKA stump. S/p revision of L BKA to AKA 08/19/23. PMHx: poorly controlled type 2 diabetes, GERD, hypertension, dyslipidemia, CAD, L BKA 06/17/23, and neuropathy.  Clinical Impression  Patient presents with dependencies in gait and mobility, mostly limited by pain and generalized weakness due to several surgeries in the past several weeks.  Feel patient would benefit from intensive inpatient follow up therapy,  > 3 hours/day to reach maximum potential for ultimate discharge home.         If plan is discharge home, recommend the following: A little help with walking and/or transfers;A little help with bathing/dressing/bathroom;Help with stairs or ramp for entrance;Assist for transportation   Can travel by private vehicle        Equipment Recommendations None recommended by PT  Recommendations for Other Services  Rehab consult    Functional Status Assessment Patient has had a recent decline in their functional status and demonstrates the ability to make significant improvements in function in a reasonable and predictable amount of time.     Precautions / Restrictions Precautions Precautions: Fall Restrictions Other Position/Activity Restrictions: Left AKA      Mobility  Bed Mobility Overal bed mobility: Independent                  Transfers Overall transfer level: Needs assistance Equipment used: Rolling walker (2 wheels) Transfers: Sit to/from Stand, Bed to chair/wheelchair/BSC Sit to Stand: Contact guard assist   Step pivot transfers: Contact guard assist            Ambulation/Gait               General Gait Details: unable due to pain  Stairs            Wheelchair Mobility     Tilt Bed    Modified Rankin (Stroke Patients  Only)       Balance Overall balance assessment: Needs assistance Sitting-balance support: No upper extremity supported, Feet supported Sitting balance-Leahy Scale: Good     Standing balance support: Bilateral upper extremity supported, During functional activity, Reliant on assistive device for balance Standing balance-Leahy Scale: Poor                               Pertinent Vitals/Pain Pain Assessment Pain Assessment: 0-10 Pain Score: 9  Pain Location: left residual limb Pain Descriptors / Indicators: Constant, Crushing, Moaning, Operative site guarding, Grimacing Pain Intervention(s): Limited activity within patient's tolerance, Monitored during session, Patient requesting pain meds-RN notified, Premedicated before session    Home Living Family/patient expects to be discharged to:: Private residence Living Arrangements: Spouse/significant other Available Help at Discharge: Family;Available 24 hours/day Type of Home: Mobile home Home Access: Stairs to enter Entrance Stairs-Rails: Right;Can reach both;Left Entrance Stairs-Number of Steps: 3   Home Layout: One level Home Equipment: BSC/3in1;Rolling Walker (2 wheels);Crutches;Cane - single point;Wheelchair - manual Additional Comments: wife is home at all time but helps with mother that has dementia. wife does not drive. daughter does all the necessary driving    Prior Function Prior Level of Function : Needs assist       Physical Assist : Mobility (physical);ADLs (physical) Mobility (physical): Transfers;Gait;Stairs   Mobility Comments: ambulates w/ RW and wife holding onto him  Extremity/Trunk Assessment        Lower Extremity Assessment Lower Extremity Assessment: Overall WFL for tasks assessed       Communication   Communication Communication: Hearing impairment Cueing Techniques: Verbal cues  Cognition Arousal: Alert Behavior During Therapy: WFL for tasks assessed/performed Overall  Cognitive Status: Within Functional Limits for tasks assessed                                          General Comments      Exercises     Assessment/Plan    PT Assessment Patient needs continued PT services  PT Problem List Decreased activity tolerance;Decreased balance;Decreased mobility;Pain       PT Treatment Interventions Gait training;DME instruction;Therapeutic activities;Therapeutic exercise;Balance training;Functional mobility training;Patient/family education    PT Goals (Current goals can be found in the Care Plan section)  Acute Rehab PT Goals Patient Stated Goal: stop hurting! PT Goal Formulation: With patient Time For Goal Achievement: 09/03/23 Potential to Achieve Goals: Good    Frequency Min 1X/week     Co-evaluation               AM-PAC PT 6 Clicks Mobility  Outcome Measure Help needed turning from your back to your side while in a flat bed without using bedrails?: None Help needed moving from lying on your back to sitting on the side of a flat bed without using bedrails?: None Help needed moving to and from a bed to a chair (including a wheelchair)?: A Little Help needed standing up from a chair using your arms (e.g., wheelchair or bedside chair)?: A Little Help needed to walk in hospital room?: A Little Help needed climbing 3-5 steps with a railing? : A Lot 6 Click Score: 19    End of Session Equipment Utilized During Treatment: Gait belt Activity Tolerance: Patient limited by pain Patient left: in chair;with call bell/phone within reach;with chair alarm set Nurse Communication: Patient requests pain meds PT Visit Diagnosis: Unsteadiness on feet (R26.81);Other abnormalities of gait and mobility (R26.89);Muscle weakness (generalized) (M62.81);History of falling (Z91.81)    Time: 9067-9041 PT Time Calculation (min) (ACUTE ONLY): 26 min   Charges:   PT Evaluation $PT Eval Moderate Complexity: 1 Mod PT Treatments $Gait  Training: 8-22 mins PT General Charges $$ ACUTE PT VISIT: 1 Visit         08/20/2023 Lebron, PT Acute Rehabilitation Services Office:  [phone]   Katharina Venus HERO 08/20/2023, 10:09 AM

## 2023-08-20 NOTE — Plan of Care (Signed)
°  Problem: Clinical Measurements: Goal: Will remain free from infection Outcome: Progressing   Problem: Activity: Goal: Risk for activity intolerance will decrease Outcome: Progressing   Problem: Nutrition: Goal: Adequate nutrition will be maintained Outcome: Progressing

## 2023-08-20 NOTE — Plan of Care (Signed)
  Problem: Education: Goal: Knowledge of the prescribed therapeutic regimen will improve Outcome: Progressing   Problem: Activity: Goal: Ability to perform//tolerate increased activity and mobilize with assistive devices will improve Outcome: Progressing   Problem: Clinical Measurements: Goal: Postoperative complications will be avoided or minimized Outcome: Progressing   Problem: Self-Care: Goal: Ability to meet self-care needs will improve Outcome: Progressing   Problem: Pain Management: Goal: Pain level will decrease with appropriate interventions Outcome: Progressing   Problem: Skin Integrity: Goal: Demonstration of wound healing without infection will improve Outcome: Progressing

## 2023-08-21 LAB — GLUCOSE, CAPILLARY
Glucose-Capillary: 213 mg/dL — ABNORMAL HIGH (ref 70–99)
Glucose-Capillary: 199 mg/dL — ABNORMAL HIGH (ref 70–99)
Glucose-Capillary: 182 mg/dL — ABNORMAL HIGH (ref 70–99)
Glucose-Capillary: 209 mg/dL — ABNORMAL HIGH (ref 70–99)

## 2023-08-21 NOTE — Plan of Care (Signed)
   Problem: Activity: Goal: Risk for activity intolerance will decrease Outcome: Progressing   Problem: Nutrition: Goal: Adequate nutrition will be maintained Outcome: Progressing

## 2023-08-21 NOTE — Progress Notes (Signed)
 Occupational Therapy Treatment Patient Details Name: Mike Floyd MRN: [id number] DOB: 1960-12-03 Today's Date: 08/21/2023   History of present illness     OT comments  Patient making good gains with OT but is limited by 9/10 pain at LLE. Patient eager to get OOB even with pain with movement. Patient able to get to EOB with no assistance and min assist for transfer to recliner. Patient performed self care tasks seated in recliner due to pain. Patient will benefit from intensive inpatient follow up therapy, >3 hours/day to address bathing, dressing, and toilet transfers. Acute OT to continue to follow.       If plan is discharge home, recommend the following:  A little help with walking and/or transfers;A little help with bathing/dressing/bathroom   Equipment Recommendations  Other (comment) (defer)    Recommendations for Other Services      Precautions / Restrictions Precautions Precautions: Fall Restrictions Weight Bearing Restrictions Per Provider Order: Yes LLE Weight Bearing Per Provider Order: Non weight bearing Other Position/Activity Restrictions: Left AKA       Mobility Bed Mobility Overal bed mobility: Independent             General bed mobility comments: no assistance to get to EOB    Transfers Overall transfer level: Needs assistance Equipment used: Rolling walker (2 wheels) Transfers: Sit to/from Stand, Bed to chair/wheelchair/BSC Sit to Stand: Contact guard assist Stand pivot transfers: Contact guard assist         General transfer comment: limited due to pain     Balance Overall balance assessment: Needs assistance Sitting-balance support: No upper extremity supported, Feet supported Sitting balance-Leahy Scale: Good Sitting balance - Comments: EOB   Standing balance support: Bilateral upper extremity supported, During functional activity, Reliant on assistive device for balance Standing balance-Leahy Scale: Poor Standing balance comment:  dependent on BUE support when standig                           ADL either performed or assessed with clinical judgement   ADL Overall ADL's : Needs assistance/impaired     Grooming: Wash/dry hands;Wash/dry face;Oral care;Set up;Sitting   Upper Body Bathing: Set up;Sitting   Lower Body Bathing: Supervison/ safety;Sitting/lateral leans Lower Body Bathing Details (indicate cue type and reason): in recliner Upper Body Dressing : Set up;Sitting Upper Body Dressing Details (indicate cue type and reason): change gown     Toilet Transfer: Minimal assistance;Stand-pivot;Rolling walker (2 wheels) Toilet Transfer Details (indicate cue type and reason): simulated with stand pivot from EOB to recliner           General ADL Comments: limited by pain    Extremity/Trunk Assessment              Vision       Perception     Praxis      Cognition Arousal: Alert Behavior During Therapy: WFL for tasks assessed/performed Overall Cognitive Status: Within Functional Limits for tasks assessed                                 General Comments: complaints of pain but wants to participate with therapy        Exercises      Shoulder Instructions       General Comments VSS on RA    Pertinent Vitals/ Pain       Pain Assessment Pain Assessment: 0-10  Pain Score: 9  Pain Location: left residual limb Pain Descriptors / Indicators: Constant, Crushing, Moaning, Operative site guarding, Grimacing Pain Intervention(s): Limited activity within patient's tolerance, Monitored during session, Premedicated before session, Repositioned  Home Living                                          Prior Functioning/Environment              Frequency  Min 2X/week        Progress Toward Goals  OT Goals(current goals can now be found in the care plan section)  Progress towards OT goals: Progressing toward goals  Acute Rehab OT Goals Patient  Stated Goal: less pain OT Goal Formulation: With patient Time For Goal Achievement: 09/03/23 Potential to Achieve Goals: Good ADL Goals Pt Will Perform Lower Body Dressing: with modified independence;sit to/from stand Pt Will Transfer to Toilet: with modified independence;ambulating Additional ADL Goal #1: Pt will demonstrate independence with desensitization strategies of LLE.  Plan      Co-evaluation                 AM-PAC OT 6 Clicks Daily Activity     Outcome Measure   Help from another person eating meals?: None Help from another person taking care of personal grooming?: A Little Help from another person toileting, which includes using toliet, bedpan, or urinal?: A Little Help from another person bathing (including washing, rinsing, drying)?: A Little Help from another person to put on and taking off regular upper body clothing?: A Little Help from another person to put on and taking off regular lower body clothing?: A Little 6 Click Score: 19    End of Session Equipment Utilized During Treatment: Rolling walker (2 wheels)  OT Visit Diagnosis: Unsteadiness on feet (R26.81);Muscle weakness (generalized) (M62.81);Pain Pain - Right/Left: Left Pain - part of body: Leg   Activity Tolerance Patient limited by pain   Patient Left in chair;with call bell/phone within reach;with chair alarm set   Nurse Communication Mobility status        Time: 9259-9186 OT Time Calculation (min): 33 min  Charges: OT General Charges $OT Visit: 1 Visit OT Treatments $Self Care/Home Management : 8-22 mins $Therapeutic Activity: 8-22 mins  Dick Laine, OTA Acute Rehabilitation Services  Office [phone]   Jeb LITTIE Laine 08/21/2023, 9:59 AM

## 2023-08-21 NOTE — Progress Notes (Signed)
 Inpatient Rehab Admissions Coordinator:    CIR consult received. Per current payor trends with [facility] [employer], payor is unlikely to approve CIR for Pt.'s diagnosis. Pt. Is progressing well and I think he's likely to progress to being able to go home with [facility] once pain is better managed.  I will not pursue CIR admit.   Leita Kleine, MS, CCC-SLP Rehab Admissions Coordinator  [phone] (celll) [phone] (office)

## 2023-08-21 NOTE — Progress Notes (Signed)
 Patient ID: BRAINARD HIGHFILL, male   DOB: 02/14/1961, 63 y.o.   MRN: [id number] Patient out of bed to a chair. VAC without drainage. Pain controlled this am.  Samuella Cota, MD Cyndia Skeeters

## 2023-08-22 LAB — GLUCOSE, CAPILLARY
Glucose-Capillary: 199 mg/dL — ABNORMAL HIGH (ref 70–99)
Glucose-Capillary: 243 mg/dL — ABNORMAL HIGH (ref 70–99)
Glucose-Capillary: 210 mg/dL — ABNORMAL HIGH (ref 70–99)
Glucose-Capillary: 314 mg/dL — ABNORMAL HIGH (ref 70–99)

## 2023-08-22 LAB — SURGICAL PATHOLOGY

## 2023-08-22 MED ORDER — METHOCARBAMOL 500 MG PO TABS
500.0000 mg | ORAL_TABLET | Freq: Four times a day (QID) | ORAL | Status: DC | PRN
  Administered 2023-08-22 – 2023-08-24 (×3): 500 mg via ORAL
  Filled 2023-08-22 (×4): qty 1

## 2023-08-22 NOTE — Plan of Care (Signed)
  Problem: Education: Goal: Knowledge of General Education information will improve Description: Including pain rating scale, medication(s)/side effects and non-pharmacologic comfort measures Outcome: Progressing   Problem: Health Behavior/Discharge Planning: Goal: Ability to manage health-related needs will improve Outcome: Progressing   Problem: Clinical Measurements: Goal: Ability to maintain clinical measurements within normal limits will improve Outcome: Progressing Goal: Will remain free from infection Outcome: Progressing Goal: Diagnostic test results will improve Outcome: Progressing Goal: Respiratory complications will improve Outcome: Progressing Goal: Cardiovascular complication will be avoided Outcome: Progressing   Problem: Activity: Goal: Risk for activity intolerance will decrease Outcome: Progressing   Problem: Nutrition: Goal: Adequate nutrition will be maintained Outcome: Progressing   Problem: Coping: Goal: Level of anxiety will decrease Outcome: Progressing   Problem: Elimination: Goal: Will not experience complications related to bowel motility Outcome: Progressing Goal: Will not experience complications related to urinary retention Outcome: Progressing   Problem: Pain Management: Goal: General experience of comfort will improve Outcome: Progressing   Problem: Safety: Goal: Ability to remain free from injury will improve Outcome: Progressing   Problem: Skin Integrity: Goal: Risk for impaired skin integrity will decrease Outcome: Progressing   Problem: Education: Goal: Ability to describe self-care measures that may prevent or decrease complications (Diabetes Survival Skills Education) will improve Outcome: Progressing Goal: Individualized Educational Video(s) Outcome: Progressing   Problem: Coping: Goal: Ability to adjust to condition or change in health will improve Outcome: Progressing   Problem: Fluid Volume: Goal: Ability to  maintain a balanced intake and output will improve Outcome: Progressing   Problem: Health Behavior/Discharge Planning: Goal: Ability to identify and utilize available resources and services will improve Outcome: Progressing Goal: Ability to manage health-related needs will improve Outcome: Progressing   Problem: Metabolic: Goal: Ability to maintain appropriate glucose levels will improve Outcome: Progressing   Problem: Nutritional: Goal: Maintenance of adequate nutrition will improve Outcome: Progressing Goal: Progress toward achieving an optimal weight will improve Outcome: Progressing   Problem: Skin Integrity: Goal: Risk for impaired skin integrity will decrease Outcome: Progressing   Problem: Tissue Perfusion: Goal: Adequacy of tissue perfusion will improve Outcome: Progressing   Problem: Education: Goal: Knowledge of the prescribed therapeutic regimen will improve Outcome: Progressing Goal: Ability to verbalize activity precautions or restrictions will improve Outcome: Progressing Goal: Understanding of discharge needs will improve Outcome: Progressing   Problem: Activity: Goal: Ability to perform//tolerate increased activity and mobilize with assistive devices will improve Outcome: Progressing   Problem: Clinical Measurements: Goal: Postoperative complications will be avoided or minimized Outcome: Progressing   Problem: Self-Care: Goal: Ability to meet self-care needs will improve Outcome: Progressing   Problem: Self-Concept: Goal: Ability to maintain and perform role responsibilities to the fullest extent possible will improve Outcome: Progressing   Problem: Pain Management: Goal: Pain level will decrease with appropriate interventions Outcome: Progressing   Problem: Skin Integrity: Goal: Demonstration of wound healing without infection will improve Outcome: Progressing

## 2023-08-22 NOTE — Progress Notes (Signed)
 Patient ID: Mike Floyd, male   DOB: November 03, 1960, 63 y.o.   MRN: [id number] Patient is seen in follow-up status post above-knee amputation.  Patient is not a candidate for inpatient rehab due to his insurance.  Patient states he does not feel safe to go home at this time.  I will place an order for skilled nursing placement.  Will see how patient progresses with therapy.

## 2023-08-22 NOTE — Plan of Care (Signed)
  Problem: Education: Goal: Knowledge of General Education information will improve Description: Including pain rating scale, medication(s)/side effects and non-pharmacologic comfort measures Outcome: Progressing   Problem: Clinical Measurements: Goal: Ability to maintain clinical measurements within normal limits will improve Outcome: Progressing   Problem: Activity: Goal: Risk for activity intolerance will decrease Outcome: Progressing   Problem: Nutrition: Goal: Adequate nutrition will be maintained Outcome: Progressing   Problem: Pain Management: Goal: General experience of comfort will improve Outcome: Progressing   Problem: Safety: Goal: Ability to remain free from injury will improve Outcome: Progressing   Problem: Skin Integrity: Goal: Risk for impaired skin integrity will decrease Outcome: Progressing

## 2023-08-22 NOTE — TOC Initial Note (Signed)
 Transition of Care [facility]) - Initial/Assessment Note    Patient Details  Name: Mike Floyd MRN: [id number] Date of Birth: [birth year]  Transition [facility]) CM/SW Contact:    Bridget Cordella Simmonds, LCSW Phone Number: 08/22/2023, 10:54 AM  Clinical Narrative: CSW met with pt regarding DC plan.  Pt confirms that CIR unable to admit and he would like to pursue SNF. Pt from home with wife.  HH PT currently in place but pt does not recall which agency.  Permission given to speak with wife Madelin, daughter Powell.  Medicare choice document provided, permission given to send out referral in hub.  Pt would like SNF in [facility].                   Referral sent out in hub for SNF.  Expected Discharge Plan: Skilled Nursing Facility Barriers to Discharge: Continued Medical Work up, SNF Pending bed offer   Patient Goals and CMS Choice Patient states their goals for this hospitalization and ongoing recovery are:: walk again CMS Medicare.gov Compare Post Acute Care list provided to:: Patient Choice offered to / list presented to : Patient      Expected Discharge Plan and Services In-house Referral: Clinical Social Work   Post Acute Care Choice: Skilled Nursing Facility Living arrangements for the past 2 months: Single Family Home                                      Prior Living Arrangements/Services Living arrangements for the past 2 months: Single Family Home Lives with:: Spouse Patient language and need for interpreter reviewed:: Yes Do you feel safe going back to the place where you live?: Yes      Need for Family Participation in Patient Care: No (Comment) Care giver support system in place?: Yes (comment) Current home services: Home PT (pt unable to remember which [facility] agency) Criminal Activity/Legal Involvement Pertinent to Current Situation/Hospitalization: No - Comment as needed  Activities of Daily Living   ADL Screening (condition at time of  admission) Independently performs ADLs?: No Does the patient have a NEW difficulty with bathing/dressing/toileting/self-feeding that is expected to last >3 days?: No Does the patient have a NEW difficulty with getting in/out of bed, walking, or climbing stairs that is expected to last >3 days?: No Does the patient have a NEW difficulty with communication that is expected to last >3 days?: No Is the patient deaf or have difficulty hearing?: Yes Does the patient have difficulty seeing, even when wearing glasses/contacts?: No Does the patient have difficulty concentrating, remembering, or making decisions?: No  Permission Sought/Granted Permission sought to share information with : Family Supports Permission granted to share information with : Yes, Verbal Permission Granted  Share Information with NAME: wife [name], daughter Powell  Permission granted to share info w AGENCY: SNF        Emotional Assessment Appearance:: Appears stated age Attitude/Demeanor/Rapport: Engaged Affect (typically observed): Appropriate, Pleasant Orientation: : Oriented to Self, Oriented to Place, Oriented to  Time, Oriented to Situation      Admission diagnosis:  S/P AKA (above knee amputation) unilateral, left (HCC) [S10.387] Patient Active Problem List   Diagnosis Date Noted   S/P AKA (above knee amputation) unilateral, left (HCC) 08/19/2023   Dehiscence of amputation stump of left lower extremity (HCC) 07/22/2023   Ischemia of site of left below knee amputation (HCC) 07/22/2023   S/P  BKA (below knee amputation) unilateral, left (HCC) 07/22/2023   Below-knee amputation of left lower extremity (HCC) 06/17/2023   Acute osteomyelitis of metatarsal bone of left foot (HCC)    Subacute osteomyelitis, left ankle and foot (HCC) 05/13/2022   Non-pressure chronic ulcer of other part of right foot limited to breakdown of skin (HCC)    Contracture of right Achilles tendon    Mildly dilatd aortic root (HCC)     Contracture of left Achilles tendon    Metatarsal deformity, left    CAD (coronary artery disease)    Hallux hammertoe, left 12/11/2019   Diarrhea 06/27/2019   Diastasis recti 08/22/2018   Umbilical hernia without obstruction and without gangrene 08/22/2018   Vitamin D  deficiency 06/23/2017   Essential hypertension, benign 06/15/2017   Mixed hyperlipidemia 06/15/2017   Class 1 obesity due to excess calories with serious comorbidity and body mass index (BMI) of 31.0 to 31.9 in adult 06/15/2017   Malfunction of implantable cardioverter-defibrillator (ICD) electrode 10/07/2016   Chronic kidney disease (CKD), stage III (moderate) (HCC) 04/04/2015   Bladder neck obstruction 03/21/2015   Difficult or painful urination 03/21/2015   Flank pain 03/21/2015   Delayed onset of urination 03/21/2015   Calculus of kidney 03/21/2015   Erosive esophagitis 12/27/2013   Hypotension due to drugs 07/02/2013   Chronic systolic heart failure (HCC) 04/24/2013   CAP (community acquired pneumonia) 03/16/2013   HTN (hypertension) 03/15/2013   Hydrocele 03/15/2013   Spermatocele 03/15/2013   DOE (dyspnea on exertion) 03/15/2013   OSA (obstructive sleep apnea) 01/09/2013   Chronic kidney disease, stage 3 (HCC) 01/03/2013   Chest pain 12/26/2012   History of diagnostic tests 09/06/2012   TIA (transient ischemic attack)    Cardiomyopathy, nonischemic (HCC) 02/24/2010   AICD (automatic cardioverter/defibrillator) 02/24/2010   DM type 2 causing vascular disease (HCC) 06/27/2009   Gout 06/27/2009   Tobacco abuse, in remission 06/27/2009   COLONIC POLYPS 10/31/2008   GERD (gastroesophageal reflux disease) 10/31/2008   PCP:  Bertell Satterfield, MD Pharmacy:   [facility] [place] [place], [place] - [address] [place] [address] Phone: [phone] Fax: [phone]  [facility]) - [place], [place] - [address] [facility] [address] [place] [address] [place] [address] Phone: [phone] Fax: [phone]  [facility] [address] [place] [address] Phone: [phone] Fax: [phone]  [facility] [address], [place] - [address] [place] [address] Phone: [phone] Fax: [phone]  [facility] [address] [place] - [address] [place] [address] [place] [address] [place] [address] Phone: [phone] Fax: [phone]     Social Drivers of Health (SDOH) Social History: SDOH Screenings   Food Insecurity: No Food Insecurity (08/19/2023)  Housing: Low Risk  (08/19/2023)  Transportation Needs: No Transportation Needs (08/19/2023)  Utilities: Not At Risk (08/19/2023)  Tobacco Use: Medium Risk (08/19/2023)   SDOH Interventions:     Readmission Risk Interventions    05/20/2022    4:09 PM  Readmission Risk Prevention Plan  Post Dischage Appt Complete  Medication Screening Complete  Transportation Screening Complete

## 2023-08-22 NOTE — Progress Notes (Signed)
 Physical Therapy Treatment Patient Details Name: Mike Floyd MRN: [id number] DOB: October 01, 1960 Today's Date: 08/22/2023   History of Present Illness Pt is a 63 y/o male admitted to [facility] 08/19/23 for  dehiscence of L BKA stump. S/p revision of L BKA to AKA 08/19/23. PMHx: poorly controlled type 2 diabetes, GERD, hypertension, dyslipidemia, CAD, L BKA 06/17/23, and neuropathy.    PT Comments   Pt received in supine, c/o severe LLE pain and with limited tolerance for LLE ROM/repositioning, pt moaning and crying and guarding limb throughout. Emphasis on pursed-lip breathing, benefits of mobility/risks of immobility, HEP handout (visual/verbal demo to pt and spouse since pt is so pain limited) and instruction on use of ice and techniques to reduce phantom limb pain vs spasm type pain. Pt continues to benefit from PT services to progress toward functional mobility goals, PTA plan to return later after pt given PO meds to see if he is able to tolerate more mobility.    If plan is discharge home, recommend the following: A little help with walking and/or transfers;A little help with bathing/dressing/bathroom;Help with stairs or ramp for entrance;Assist for transportation   Can travel by private vehicle        Equipment Recommendations  None recommended by PT (TBD post-acute)    Recommendations for Other Services       Precautions / Restrictions Precautions Precautions: Fall Precaution Comments: L wound vac Restrictions Weight Bearing Restrictions Per Provider Order: Yes LLE Weight Bearing Per Provider Order: Non weight bearing Other Position/Activity Restrictions: Left AKA     Mobility  Bed Mobility               General bed mobility comments: pt not able to yet due to constant c/o pain with any small movements of LLE/RLE    Transfers Overall transfer level: Needs assistance                 General transfer comment: defer, pt in too much pain to attempt     Ambulation/Gait                   Stairs             Wheelchair Mobility     Tilt Bed    Modified Rankin (Stroke Patients Only)       Balance Overall balance assessment: Needs assistance     Sitting balance - Comments: pt defers EOB until pain meds given                                    Cognition Arousal: Alert Behavior During Therapy: WFL for tasks assessed/performed Overall Cognitive Status: Within Functional Limits for tasks assessed                                 General Comments: Pt much more limited today due to pain/internal distraction due to pain, PTA discussed with pt and spouse different things he can try for pain mgmt other than meds (if he is not due yet for pain meds, etc). but pt very distracted due to pain and frequently moaning.        Exercises Other Exercises Other Exercises: pt given HEP handout from medbridge with info also on L residual limb rubbing/tapping, unit secretary also notified pt spouse asking for purell to be ordered for pt personal use  since he uses urinal then needs to clean his hands before touching his limbs. Other Exercises: HEP handout: Visit    [email or web address]   Access Code: [email or web address]    General Comments General comments (skin integrity, edema, etc.): PTA instructed pt/spouse on phantom pain relief/tapping, use of ice PRN, repositioning, and moving limb in direction opposite to that of muscle cramps to try to reduce spasm sensation. wound vac in place appears c/d/i      Pertinent Vitals/Pain Pain Assessment Pain Assessment: Faces Pain Score: 10-Worst pain ever Faces Pain Scale: Hurts worst Pain Location: left residual limb Pain Descriptors / Indicators: Constant, Crushing, Moaning, Operative site guarding, Grimacing, Crying, Sharp, Cramping Pain Intervention(s): Limited activity within patient's tolerance, Monitored during session, Repositioned, Patient  requesting pain meds-RN notified, Ice applied    Home Living                          Prior Function            PT Goals (current goals can now be found in the care plan section) Acute Rehab PT Goals Patient Stated Goal: stop hurting! PT Goal Formulation: With patient Time For Goal Achievement: 09/03/23 Progress towards PT goals: Not progressing toward goals - comment (pain)    Frequency    Min 1X/week      PT Plan      Co-evaluation              AM-PAC PT 6 Clicks Mobility   Outcome Measure  Help needed turning from your back to your side while in a flat bed without using bedrails?: A Little Help needed moving from lying on your back to sitting on the side of a flat bed without using bedrails?: A Little Help needed moving to and from a bed to a chair (including a wheelchair)?: A Little Help needed standing up from a chair using your arms (e.g., wheelchair or bedside chair)?: A Little Help needed to walk in hospital room?: Total Help needed climbing 3-5 steps with a railing? : Total 6 Click Score: 14    End of Session   Activity Tolerance: Patient limited by pain Patient left: in bed;with call bell/phone within reach;with bed alarm set;with family/visitor present (spouse in room) Nurse Communication: Patient requests pain meds;Other (comment) (pt unable to attempt repositioning or OOB due to pain) PT Visit Diagnosis: Unsteadiness on feet (R26.81);Other abnormalities of gait and mobility (R26.89);Muscle weakness (generalized) (M62.81);History of falling (Z91.81)     Time: 8754-8745 PT Time Calculation (min) (ACUTE ONLY): 9 min  Charges:    $Therapeutic Activity: 8-22 mins PT General Charges $$ ACUTE PT VISIT: 1 Visit                     Taji Sather P., PTA Acute Rehabilitation Services Secure Chat Preferred 9a-5:30pm Office: [phone]    Connell HERO [facility] 08/22/2023, 2:36 PM

## 2023-08-22 NOTE — Care Management Important Message (Signed)
 Important Message  Patient Details  Name: Mike Floyd MRN: [id number] Date of Birth: 1961-08-02   Important Message Given:  Yes - Medicare IM     [name] 08/22/2023, 3:30 PM

## 2023-08-22 NOTE — Progress Notes (Signed)
 Physical Therapy Treatment Patient Details Name: Mike Floyd MRN: [id number] DOB: 16-Nov-1960 Today's Date: 08/22/2023   History of Present Illness Pt is a 63 y/o male admitted to [facility] 08/19/23 for  dehiscence of L BKA stump. S/p revision of L BKA to AKA 08/19/23. PMHx: poorly controlled type 2 diabetes, GERD, hypertension, dyslipidemia, CAD, L BKA 06/17/23, and neuropathy.    PT Comments  PTA returned to room after pt premedicated by RN (IV not working so pt had PO meds) and pt remains very limited due to c/o LLE cramping/spasms, pt having difficulty focusing on education and mobility due to this. Pt able to perform some RLE ROM and some LLE ROM in very limited range due to pain. Pt able to perform supine to long sitting briefly but pain too severe for him to attempt EOB/OOB and pt loudly moaning throughout session. Pillows repositioned for pressure relief and PTA recommended to nursing staff to assist him with rolling Q2H in supine while pain limiting him from mobilizing OOB. RN working on getting him medication from MD for his spasms, plan to work on OOB mobility next session if pain improved. Pt reports ice pack did not help with pain. Pt continues to benefit from PT services to progress toward functional mobility goals.     If plan is discharge home, recommend the following: A little help with walking and/or transfers;A little help with bathing/dressing/bathroom;Help with stairs or ramp for entrance;Assist for transportation   Can travel by private vehicle        Equipment Recommendations  None recommended by PT (TBD post-acute)    Recommendations for Other Services       Precautions / Restrictions Precautions Precautions: Fall Precaution Comments: L wound vac Restrictions Weight Bearing Restrictions Per Provider Order: Yes LLE Weight Bearing Per Provider Order: Non weight bearing Other Position/Activity Restrictions: Left AKA     Mobility  Bed Mobility Overal bed mobility: Needs  Assistance Bed Mobility: Rolling, Supine to Sit Rolling: Supervision   Supine to sit: Contact guard     General bed mobility comments: pt using bil rails to pull up on for long sitting.    Transfers Overall transfer level: Needs assistance                 General transfer comment: pt performs single posterior scoot in long sitting posture in bed with CGA for safety, after this he is unable to do more due to pain, despite premedication. Unable to assess OOB to chair today due to pt severe pain score.    Ambulation/Gait                   Stairs             Wheelchair Mobility     Tilt Bed    Modified Rankin (Stroke Patients Only)       Balance Overall balance assessment: Needs assistance Sitting-balance support: Bilateral upper extremity supported Sitting balance-Leahy Scale: Fair Sitting balance - Comments: decreased stability due to pt pain, pt using bil rails for long sitting most of the time, occasoinally single UE support.       Standing balance comment: defer due to pt continue severe c/o LLE pain and unable to safely follow instructions to mobilize OOB                            Cognition Arousal: Alert Behavior During Therapy: [facility] for tasks assessed/performed Overall Cognitive Status:  Within Functional Limits for tasks assessed                                 General Comments: Pt remains limited due to pain despite RN premedicating him ~1 hour prior to session with PO meds. Per RN, pt unable to also get IV meds as his IV is not working. RN messaging MD during session to see if he can get order for medication specifically to address pt's cramping. Pt and spouse receptive to instruction but pt having difficulty with attention to task due to pain.        Exercises Other Exercises Other Exercises: supine RLE AROM: heel slides, hip abduction, ankle pumps x5-10 reps ea Other Exercises: HEP handout: Visit     [email or web address]   Access Code: [email or web address] Other Exercises: supine LLE AROM: hip abduction, hip ADduction (squeezing yellow cushion only a few reps due to pain), hip extension, hip flexion x3-5 reps ea (as pain allows) Other Exercises: supine to long sitting x3 reps using bil bed rails with cues for core activation (pain limiting sitting balance to <3 mins at a time) Other Exercises: long sitting chair push-up to work on tricep strengthening. pt only performs 1 rep due LLE pain    General Comments General comments (skin integrity, edema, etc.): PTA brought general strengthening-supine handout for pt to work on RLE ROM per spouse request so she can help remind him which exercises to work on between his PT sessions      Pertinent Vitals/Pain Pain Assessment Pain Assessment: Faces Pain Score: 10-Worst pain ever Faces Pain Scale: Hurts worst Pain Location: left residual limb Pain Descriptors / Indicators: Moaning, Operative site guarding, Grimacing, Crying, Cramping, Constant, Tightness Pain Intervention(s): Limited activity within patient's tolerance, Monitored during session, Premedicated before session, Repositioned    Home Living                          Prior Function            PT Goals (current goals can now be found in the care plan section) Acute Rehab PT Goals Patient Stated Goal: stop hurting! PT Goal Formulation: With patient Time For Goal Achievement: 09/03/23 Progress towards PT goals: Not progressing toward goals - comment (pain limiting him)    Frequency    Min 1X/week      PT Plan      Co-evaluation              AM-PAC PT 6 Clicks Mobility   Outcome Measure  Help needed turning from your back to your side while in a flat bed without using bedrails?: A Little Help needed moving from lying on your back to sitting on the side of a flat bed without using bedrails?: A Little Help needed moving to and from a bed to a chair  (including a wheelchair)?: A Little Help needed standing up from a chair using your arms (e.g., wheelchair or bedside chair)?: A Little Help needed to walk in hospital room?: Total Help needed climbing 3-5 steps with a railing? : Total 6 Click Score: 14    End of Session   Activity Tolerance: Patient limited by pain Patient left: in bed;with call bell/phone within reach;with bed alarm set;with family/visitor present (spouse in room, pt long sitting with HOB elevated) Nurse Communication: Patient requests pain meds;Other (comment);Mobility status (pt unable to attempt OOB due to pain,  pt calling out constantly; wants some hand sanitizer) PT Visit Diagnosis: Unsteadiness on feet (R26.81);Other abnormalities of gait and mobility (R26.89);Muscle weakness (generalized) (M62.81);History of falling (Z91.81)     Time: 8644-8586 PT Time Calculation (min) (ACUTE ONLY): 18 min  Charges:    $Therapeutic Exercise: 8-22 mins $Therapeutic Activity: 8-22 mins PT General Charges $$ ACUTE PT VISIT: 1 Visit                     Dickie Cloe P., PTA Acute Rehabilitation Services Secure Chat Preferred 9a-5:30pm Office: [phone]    Connell HERO [facility] 08/22/2023, 2:47 PM

## 2023-08-22 NOTE — [place] FL2 (Signed)
 [place]  MEDICAID FL2 LEVEL OF CARE FORM     IDENTIFICATION  Patient Name: Mike Floyd Birthdate: 04/26/1961 Sex: male Admission Date (Current Location): 08/19/2023  [facility] and [place] Number:  Producer, Television/film/video and Address:  [facility] [place] [place], [address], [place], [place] [address]      Provider Number: [id number]  Attending Physician Name and Address:  Harden Jerona GAILS, MD  Relative Name and Phone Number:  Cristal, Howatt [phone]    Current Level of Care: Hospital Recommended Level of Care: Skilled Nursing Facility Prior Approval Number:    Date Approved/Denied:   PASRR Number: [id number]  Discharge Plan: SNF    Current Diagnoses: Patient Active Problem List   Diagnosis Date Noted   S/P AKA (above knee amputation) unilateral, left (HCC) 08/19/2023   Dehiscence of amputation stump of left lower extremity (HCC) 07/22/2023   Ischemia of site of left below knee amputation (HCC) 07/22/2023   S/P BKA (below knee amputation) unilateral, left (HCC) 07/22/2023   Below-knee amputation of left lower extremity (HCC) 06/17/2023   Acute osteomyelitis of metatarsal bone of left foot (HCC)    Subacute osteomyelitis, left ankle and foot (HCC) 05/13/2022   Non-pressure chronic ulcer of other part of right foot limited to breakdown of skin (HCC)    Contracture of right Achilles tendon    Mildly dilatd aortic root (HCC)    Contracture of left Achilles tendon    Metatarsal deformity, left    CAD (coronary artery disease)    Hallux hammertoe, left 12/11/2019   Diarrhea 06/27/2019   Diastasis recti 08/22/2018   Umbilical hernia without obstruction and without gangrene 08/22/2018   Vitamin D  deficiency 06/23/2017   Essential hypertension, benign 06/15/2017   Mixed hyperlipidemia 06/15/2017   Class 1 obesity due to excess calories with serious comorbidity and body mass index (BMI) of 31.0 to 31.9 in adult 06/15/2017   Malfunction of implantable  cardioverter-defibrillator (ICD) electrode 10/07/2016   Chronic kidney disease (CKD), stage III (moderate) (HCC) 04/04/2015   Bladder neck obstruction 03/21/2015   Difficult or painful urination 03/21/2015   Flank pain 03/21/2015   Delayed onset of urination 03/21/2015   Calculus of kidney 03/21/2015   Erosive esophagitis 12/27/2013   Hypotension due to drugs 07/02/2013   Chronic systolic heart failure (HCC) 04/24/2013   CAP (community acquired pneumonia) 03/16/2013   HTN (hypertension) 03/15/2013   Hydrocele 03/15/2013   Spermatocele 03/15/2013   DOE (dyspnea on exertion) 03/15/2013   OSA (obstructive sleep apnea) 01/09/2013   Chronic kidney disease, stage 3 (HCC) 01/03/2013   Chest pain 12/26/2012   History of diagnostic tests 09/06/2012   TIA (transient ischemic attack)    Cardiomyopathy, nonischemic (HCC) 02/24/2010   AICD (automatic cardioverter/defibrillator) 02/24/2010   DM type 2 causing vascular disease (HCC) 06/27/2009   Gout 06/27/2009   Tobacco abuse, in remission 06/27/2009   COLONIC POLYPS 10/31/2008   GERD (gastroesophageal reflux disease) 10/31/2008    Orientation RESPIRATION BLADDER Height & Weight     Self, Time, Situation, Place  Normal Continent Weight: 192 lb 14.4 oz (87.5 kg) Height:  6' 1 (185.4 cm)  BEHAVIORAL SYMPTOMS/MOOD NEUROLOGICAL BOWEL NUTRITION STATUS      Continent Diet (see discharge summary)  AMBULATORY STATUS COMMUNICATION OF NEEDS Skin   Total Care Verbally Surgical wounds                       Personal Care Assistance Level of Assistance  Bathing, Feeding, Dressing Bathing Assistance: Limited assistance Feeding assistance: Limited assistance Dressing Assistance: Limited assistance     Functional Limitations Info  Sight, Hearing, Speech Sight Info: Adequate Hearing Info: Adequate Speech Info: Adequate    SPECIAL CARE FACTORS FREQUENCY  PT (By licensed PT), OT (By licensed OT)     PT Frequency: 5x week OT Frequency: 5x  week            Contractures Contractures Info: Not present    Additional Factors Info  Code Status, Allergies, Insulin  Sliding Scale Code Status Info: full Allergies Info: NKA   Insulin  Sliding Scale Info: Novolog : see discharge summary       Current Medications (08/22/2023):  This is the current hospital active medication list Current Facility-Administered Medications  Medication Dose Route Frequency Provider Last Rate Last Admin   0.9 %  sodium chloride  infusion   Intravenous Continuous Duda, Marcus V, MD 75 mL/hr at 08/21/23 0411 Infusion Verify at 08/21/23 0411   acetaminophen  (TYLENOL ) tablet 325-650 mg  325-650 mg Oral Q6H PRN Harden Jerona GAILS, MD       alum & mag hydroxide-simeth (MAALOX/MYLANTA) 200-200-20 MG/5ML suspension 15-30 mL  15-30 mL Oral Q2H PRN Harden Jerona GAILS, MD       ascorbic acid  (VITAMIN C ) tablet 1,000 mg  1,000 mg Oral Daily Duda, Marcus V, MD   1,000 mg at 08/22/23 1007   aspirin  EC tablet 81 mg  81 mg Oral Daily Duda, Marcus V, MD   81 mg at 08/22/23 1007   atorvastatin  (LIPITOR) tablet 20 mg  20 mg Oral q AM Harden Jerona GAILS, MD   20 mg at 08/22/23 9351   bisacodyl  (DULCOLAX) EC tablet 5 mg  5 mg Oral Daily PRN Harden Jerona GAILS, MD       carvedilol  (COREG ) tablet 12.5 mg  12.5 mg Oral BID Duda, Marcus V, MD   12.5 mg at 08/22/23 1007   docusate sodium  (COLACE) capsule 100 mg  100 mg Oral Daily Duda, Marcus V, MD   100 mg at 08/22/23 1007   famotidine  (PEPCID ) tablet 20 mg  20 mg Oral Daily Duda, Marcus V, MD   20 mg at 08/22/23 1007   furosemide  (LASIX ) tablet 20 mg  20 mg Oral Daily Duda, Marcus V, MD   20 mg at 08/22/23 1008   gabapentin  (NEURONTIN ) capsule 600 mg  600 mg Oral TID Harden Jerona GAILS, MD   600 mg at 08/22/23 1007   guaiFENesin -dextromethorphan (ROBITUSSIN DM) 100-10 MG/5ML syrup 15 mL  15 mL Oral Q4H PRN Harden Jerona GAILS, MD       hydrALAZINE  (APRESOLINE ) injection 5 mg  5 mg Intravenous Q20 Min PRN Harden Jerona GAILS, MD       HYDROmorphone  (DILAUDID )  injection 0.5-1 mg  0.5-1 mg Intravenous Q2H PRN Harden Jerona GAILS, MD   1 mg at 08/21/23 2305   insulin  aspart (novoLOG ) injection 0-15 Units  0-15 Units Subcutaneous TID WC Duda, Marcus V, MD   3 Units at 08/22/23 9351   insulin  aspart (novoLOG ) injection 4 Units  4 Units Subcutaneous TID WC Duda, Marcus V, MD   4 Units at 08/22/23 9349   insulin  glargine-yfgn (SEMGLEE ) injection 20 Units  20 Units Subcutaneous QHS Harden Jerona GAILS, MD   20 Units at 08/21/23 2049   isosorbide  mononitrate (IMDUR ) 24 hr tablet 30 mg  30 mg Oral Daily Duda, Marcus V, MD   30 mg at 08/22/23 1007   labetalol  (NORMODYNE ) injection  10 mg  10 mg Intravenous Q10 min PRN Harden Jerona GAILS, MD       magnesium  citrate solution 1 Bottle  1 Bottle Oral Once PRN Harden Jerona GAILS, MD       magnesium  sulfate IVPB 2 g 50 mL  2 g Intravenous Daily PRN Duda, Marcus V, MD       metoprolol  tartrate (LOPRESSOR ) injection 2-5 mg  2-5 mg Intravenous Q2H PRN Harden Jerona GAILS, MD       nutrition supplement (JUVEN) (JUVEN) powder packet 1 packet  1 packet Oral BID BM Duda, Marcus V, MD   1 packet at 08/22/23 1005   ondansetron  (ZOFRAN ) injection 4 mg  4 mg Intravenous Q6H PRN Harden Jerona GAILS, MD       oxyCODONE  (Oxy IR/ROXICODONE ) immediate release tablet 10-15 mg  10-15 mg Oral Q4H PRN Harden Jerona GAILS, MD   15 mg at 08/22/23 0308   oxyCODONE  (Oxy IR/ROXICODONE ) immediate release tablet 5-10 mg  5-10 mg Oral Q4H PRN Harden Jerona GAILS, MD       pantoprazole  (PROTONIX ) EC tablet 40 mg  40 mg Oral Daily Duda, Marcus V, MD   40 mg at 08/22/23 1007   phenol (CHLORASEPTIC) mouth spray 1 spray  1 spray Mouth/Throat PRN Harden Jerona GAILS, MD       polyethylene glycol (MIRALAX  / GLYCOLAX ) packet 17 g  17 g Oral Daily PRN Harden Jerona GAILS, MD       potassium chloride  SA (KLOR-CON  M) CR tablet 20 mEq  20 mEq Oral Daily Duda, Marcus V, MD   20 mEq at 08/22/23 1008   potassium chloride  SA (KLOR-CON  M) CR tablet 20-40 mEq  20-40 mEq Oral Daily PRN Harden Jerona GAILS, MD        tamsulosin  (FLOMAX ) capsule 0.4 mg  0.4 mg Oral Daily Harden Jerona GAILS, MD   0.4 mg at 08/22/23 1007   zinc  sulfate (50mg  elemental zinc ) capsule 220 mg  220 mg Oral Daily Duda, Marcus V, MD   220 mg at 08/22/23 1007   zolpidem  (AMBIEN ) tablet 10 mg  10 mg Oral QHS Duda, Marcus V, MD   10 mg at 08/21/23 2042     Discharge Medications: Please see discharge summary for a list of discharge medications.  Relevant Imaging Results:  Relevant Lab Results:   Additional Information SSN: [id number]  Bridget Cordella Simmonds, LCSW

## 2023-08-23 LAB — GLUCOSE, CAPILLARY
Glucose-Capillary: 191 mg/dL — ABNORMAL HIGH (ref 70–99)
Glucose-Capillary: 226 mg/dL — ABNORMAL HIGH (ref 70–99)
Glucose-Capillary: 251 mg/dL — ABNORMAL HIGH (ref 70–99)
Glucose-Capillary: 205 mg/dL — ABNORMAL HIGH (ref 70–99)

## 2023-08-23 NOTE — Inpatient Diabetes Management (Signed)
 Inpatient Diabetes Program Recommendations  AACE/ADA: New Consensus Statement on Inpatient Glycemic Control (2015)  Target Ranges:  Prepandial:   less than 140 mg/dL      Peak postprandial:   less than 180 mg/dL (1-2 hours)      Critically ill patients:  140 - 180 mg/dL   Lab Results  Component Value Date   GLUCAP 251 (H) 08/23/2023   HGBA1C 7.7 (H) 06/17/2023    Review of Glycemic Control  Latest Reference Range & Units 08/22/23 06:19 08/22/23 11:28 08/22/23 16:23 08/22/23 20:56 08/23/23 06:17 08/23/23 07:51 08/23/23 11:10  Glucose-Capillary 70 - 99 mg/dL 800 (H) 756 (H) 789 (H) 314 (H) 191 (H) 226 (H) 251 (H)   Diabetes history: DM 2 Outpatient Diabetes medications: Lantus  20 at bedtime, 70/30 52 BID  Current orders for Inpatient glycemic control:  Semglee  20 units Daily Novolog  0-15 units tid Novolog  4 units tid Meal coverage  Inpatient Diabetes Program Recommendations:    -   Increase Semglee  to 25 units -   Increase Novolog  meal coverage to 6 units  Thanks,  Clotilda Bull RN, MSN, BC-ADM Inpatient Diabetes Coordinator Team Pager [phone] (8a-5p)

## 2023-08-23 NOTE — Progress Notes (Signed)
 Physical Therapy Treatment Patient Details Name: Mike Floyd MRN: [id number] DOB: 01-Oct-1960 Today's Date: 08/23/2023   History of Present Illness Pt is a 62 y/o male admitted to [facility] 08/19/23 for  dehiscence of L BKA stump. S/p revision of L BKA to AKA 08/19/23. PMHx: poorly controlled type 2 diabetes, GERD, hypertension, dyslipidemia, CAD, L BKA 06/17/23, and neuropathy.    PT Comments  Pt received in supine, agreeable to therapy session after premedication with muscle relaxant medication as he was having LLE cramping with any attempt at LLE AROM in supine. Pt needing up to minA to perform transfers with RW toward chair on his R, pt with good effort as able and slow/careful during transfer due to pain/guarding. Pt performed supine and standing LLE AROM exercises with increased time and cues for safety. Pt agreeable to sit up in chair ~1 hour prior to return to bed, RN notified he will need RW vs Stedy to return safely depending on pain level when returning. Pt will continue to benefit from skilled rehab in a post acute setting < 3 hours per day to maximize functional gains before returning home.    If plan is discharge home, recommend the following: A little help with walking and/or transfers;A little help with bathing/dressing/bathroom;Help with stairs or ramp for entrance;Assist for transportation   Can travel by private vehicle        Equipment Recommendations  None recommended by PT (TBD post-acute)    Recommendations for Other Services       Precautions / Restrictions Precautions Precautions: Fall Precaution Comments: L wound vac Restrictions Weight Bearing Restrictions Per Provider Order: Yes LLE Weight Bearing Per Provider Order: Non weight bearing Other Position/Activity Restrictions: Left AKA     Mobility  Bed Mobility Overal bed mobility: Needs Assistance Bed Mobility: Supine to Sit     Supine to sit: Contact guard     General bed mobility comments: increased time  and effort to perform; to R EOB    Transfers Overall transfer level: Needs assistance Equipment used: Rolling walker (2 wheels) Transfers: Sit to/from Stand, Bed to chair/wheelchair/BSC Sit to Stand: Min assist   Step pivot transfers: Contact guard assist       General transfer comment: from R EOB to chair on his R side.    Ambulation/Gait               General Gait Details: Hop-to pattern a few steps, too fatigued to perform longer distance in room   Stairs             Wheelchair Mobility     Tilt Bed    Modified Rankin (Stroke Patients Only)       Balance Overall balance assessment: Needs assistance Sitting-balance support: Bilateral upper extremity supported Sitting balance-Leahy Scale: Fair Sitting balance - Comments: 1-2 UE support EOB due to LLE pain   Standing balance support: Bilateral upper extremity supported, During functional activity, Reliant on assistive device for balance Standing balance-Leahy Scale: Poor Standing balance comment: heavy reliance on RW                            Cognition Arousal: Alert Behavior During Therapy: WFL for tasks assessed/performed Overall Cognitive Status: Within Functional Limits for tasks assessed                                 General Comments:  Pt very cooperative throughout session after muscle relaxant medication given ~1 hour prior to session. Prior to this, pt was very distracted by LLE cramping in bed when PTA first attempted        Exercises Other Exercises Other Exercises: supine LLE AROM: hip flexion, hip abduction x5 reps ea Other Exercises: standing LLE AROM: hip flexion, hip extension x5 reps (very slowly) Other Exercises: STS for BUE/RLE strengthening    General Comments General comments (skin integrity, edema, etc.): wound vac in place throughout      Pertinent Vitals/Pain Pain Assessment Pain Assessment: 0-10 Faces Pain Scale: Hurts even more Pain  Location: left residual limb Pain Descriptors / Indicators: Operative site guarding, Grimacing, Cramping, Constant, Tightness Pain Intervention(s): Limited activity within patient's tolerance, Monitored during session, Premedicated before session, Repositioned    Home Living                          Prior Function            PT Goals (current goals can now be found in the care plan section) Acute Rehab PT Goals Patient Stated Goal: stop hurting! PT Goal Formulation: With patient Time For Goal Achievement: 09/03/23 Progress towards PT goals: Progressing toward goals    Frequency    Min 1X/week      PT Plan      Co-evaluation              AM-PAC PT 6 Clicks Mobility   Outcome Measure  Help needed turning from your back to your side while in a flat bed without using bedrails?: A Little Help needed moving from lying on your back to sitting on the side of a flat bed without using bedrails?: A Little Help needed moving to and from a bed to a chair (including a wheelchair)?: A Little Help needed standing up from a chair using your arms (e.g., wheelchair or bedside chair)?: A Little Help needed to walk in hospital room?: Total Help needed climbing 3-5 steps with a railing? : Total 6 Click Score: 14    End of Session Equipment Utilized During Treatment: Gait belt Activity Tolerance: Patient tolerated treatment well;Patient limited by pain Patient left: with call bell/phone within reach;in chair;with chair alarm set Nurse Communication: Other (comment);Mobility status;Need for lift equipment (Stedy vs RW for return to bed from chair; premedication with muscle relaxants was very helpful; not to sit up in chair >1 hour due to LLE pain) PT Visit Diagnosis: Unsteadiness on feet (R26.81);Other abnormalities of gait and mobility (R26.89);Muscle weakness (generalized) (M62.81);History of falling (Z91.81)     Time: 1802 (1703 in/1706 out initially pt needs pain  medicine before OOB)-1816 PT Time Calculation (min) (ACUTE ONLY): 14 min  Charges:    $Therapeutic Exercise: 8-22 mins PT General Charges $$ ACUTE PT VISIT: 1 Visit                     Pearlee Arvizu P., PTA Acute Rehabilitation Services Secure Chat Preferred 9a-5:30pm Office: [phone]    Connell HERO [facility] 08/23/2023, 6:30 PM

## 2023-08-23 NOTE — TOC Progression Note (Signed)
 Transition of Care [facility]) - Progression Note    Patient Details  Name: Mike Floyd MRN: [id number] Date of Birth: 1960/11/15  [facility]) CM/SW Contact  Bridget Cordella Simmonds, LCSW Phone Number: 08/23/2023, 1:38 PM  Clinical Narrative:   Bed offers provided to pt, he would like to accept offer at [facility].  CSW confirmed with [facility]: bed should be available tomorrow.  Auth request submitted in [place].      Expected Discharge Plan: Skilled Nursing Facility Barriers to Discharge: Continued Medical Work up, SNF Pending bed offer  Expected Discharge Plan and Services In-house Referral: Clinical Social Work   Post Acute Care Choice: Skilled Nursing Facility Living arrangements for the past 2 months: Single Family Home                                       Social Determinants of Health (SDOH) Interventions SDOH Screenings   Food Insecurity: No Food Insecurity (08/19/2023)  Housing: Low Risk  (08/19/2023)  Transportation Needs: No Transportation Needs (08/19/2023)  Utilities: Not At Risk (08/19/2023)  Tobacco Use: Medium Risk (08/19/2023)    Readmission Risk Interventions    05/20/2022    4:09 PM  Readmission Risk Prevention Plan  Post Dischage Appt Complete  Medication Screening Complete  Transportation Screening Complete

## 2023-08-23 NOTE — Plan of Care (Signed)
  Problem: Education: Goal: Knowledge of General Education information will improve Description: Including pain rating scale, medication(s)/side effects and non-pharmacologic comfort measures Outcome: Progressing   Problem: Health Behavior/Discharge Planning: Goal: Ability to manage health-related needs will improve Outcome: Progressing   Problem: Clinical Measurements: Goal: Ability to maintain clinical measurements within normal limits will improve Outcome: Progressing Goal: Will remain free from infection Outcome: Progressing Goal: Diagnostic test results will improve Outcome: Progressing Goal: Respiratory complications will improve Outcome: Progressing Goal: Cardiovascular complication will be avoided Outcome: Progressing   Problem: Activity: Goal: Risk for activity intolerance will decrease Outcome: Progressing   Problem: Nutrition: Goal: Adequate nutrition will be maintained Outcome: Progressing   Problem: Coping: Goal: Level of anxiety will decrease Outcome: Progressing   Problem: Elimination: Goal: Will not experience complications related to bowel motility Outcome: Progressing Goal: Will not experience complications related to urinary retention Outcome: Progressing   Problem: Pain Management: Goal: General experience of comfort will improve Outcome: Progressing   Problem: Safety: Goal: Ability to remain free from injury will improve Outcome: Progressing   Problem: Skin Integrity: Goal: Risk for impaired skin integrity will decrease Outcome: Progressing   Problem: Education: Goal: Ability to describe self-care measures that may prevent or decrease complications (Diabetes Survival Skills Education) will improve Outcome: Progressing Goal: Individualized Educational Video(s) Outcome: Progressing   Problem: Coping: Goal: Ability to adjust to condition or change in health will improve Outcome: Progressing   Problem: Fluid Volume: Goal: Ability to  maintain a balanced intake and output will improve Outcome: Progressing   Problem: Health Behavior/Discharge Planning: Goal: Ability to identify and utilize available resources and services will improve Outcome: Progressing Goal: Ability to manage health-related needs will improve Outcome: Progressing   Problem: Metabolic: Goal: Ability to maintain appropriate glucose levels will improve Outcome: Progressing   Problem: Nutritional: Goal: Maintenance of adequate nutrition will improve Outcome: Progressing Goal: Progress toward achieving an optimal weight will improve Outcome: Progressing   Problem: Skin Integrity: Goal: Risk for impaired skin integrity will decrease Outcome: Progressing   Problem: Tissue Perfusion: Goal: Adequacy of tissue perfusion will improve Outcome: Progressing   Problem: Education: Goal: Knowledge of the prescribed therapeutic regimen will improve Outcome: Progressing Goal: Ability to verbalize activity precautions or restrictions will improve Outcome: Progressing Goal: Understanding of discharge needs will improve Outcome: Progressing   Problem: Activity: Goal: Ability to perform//tolerate increased activity and mobilize with assistive devices will improve Outcome: Progressing   Problem: Clinical Measurements: Goal: Postoperative complications will be avoided or minimized Outcome: Progressing   Problem: Self-Care: Goal: Ability to meet self-care needs will improve Outcome: Progressing   Problem: Self-Concept: Goal: Ability to maintain and perform role responsibilities to the fullest extent possible will improve Outcome: Progressing   Problem: Pain Management: Goal: Pain level will decrease with appropriate interventions Outcome: Progressing   Problem: Skin Integrity: Goal: Demonstration of wound healing without infection will improve Outcome: Progressing

## 2023-08-23 NOTE — Progress Notes (Signed)
 Patient ID: Mike Floyd, male   DOB: Oct 24, 1960, 63 y.o.   MRN: [id number] Patient is status post left above-knee amputation.  There is no drainage in the wound VAC canister.  Patient states he is still progressing slowly with therapy.  Anticipate discharge to skilled nursing.  Will discontinue wound VAC at discharge.

## 2023-08-24 DIAGNOSIS — Z743 Need for continuous supervision: Secondary | ICD-10-CM | POA: Diagnosis not present

## 2023-08-24 DIAGNOSIS — G4700 Insomnia, unspecified: Secondary | ICD-10-CM | POA: Diagnosis not present

## 2023-08-24 DIAGNOSIS — R531 Weakness: Secondary | ICD-10-CM | POA: Diagnosis not present

## 2023-08-24 DIAGNOSIS — I2510 Atherosclerotic heart disease of native coronary artery without angina pectoris: Secondary | ICD-10-CM | POA: Diagnosis not present

## 2023-08-24 DIAGNOSIS — M51369 Other intervertebral disc degeneration, lumbar region without mention of lumbar back pain or lower extremity pain: Secondary | ICD-10-CM | POA: Diagnosis not present

## 2023-08-24 DIAGNOSIS — N182 Chronic kidney disease, stage 2 (mild): Secondary | ICD-10-CM | POA: Diagnosis not present

## 2023-08-24 DIAGNOSIS — I5022 Chronic systolic (congestive) heart failure: Secondary | ICD-10-CM | POA: Diagnosis not present

## 2023-08-24 DIAGNOSIS — I428 Other cardiomyopathies: Secondary | ICD-10-CM | POA: Diagnosis not present

## 2023-08-24 DIAGNOSIS — R2689 Other abnormalities of gait and mobility: Secondary | ICD-10-CM | POA: Diagnosis not present

## 2023-08-24 DIAGNOSIS — K219 Gastro-esophageal reflux disease without esophagitis: Secondary | ICD-10-CM | POA: Diagnosis not present

## 2023-08-24 DIAGNOSIS — Z4781 Encounter for orthopedic aftercare following surgical amputation: Secondary | ICD-10-CM | POA: Diagnosis not present

## 2023-08-24 DIAGNOSIS — E876 Hypokalemia: Secondary | ICD-10-CM | POA: Diagnosis not present

## 2023-08-24 DIAGNOSIS — G4730 Sleep apnea, unspecified: Secondary | ICD-10-CM | POA: Diagnosis not present

## 2023-08-24 DIAGNOSIS — S88012A Complete traumatic amputation at knee level, left lower leg, initial encounter: Secondary | ICD-10-CM | POA: Diagnosis not present

## 2023-08-24 DIAGNOSIS — G629 Polyneuropathy, unspecified: Secondary | ICD-10-CM | POA: Diagnosis not present

## 2023-08-24 DIAGNOSIS — E785 Hyperlipidemia, unspecified: Secondary | ICD-10-CM | POA: Diagnosis not present

## 2023-08-24 DIAGNOSIS — Z89612 Acquired absence of left leg above knee: Secondary | ICD-10-CM | POA: Diagnosis not present

## 2023-08-24 DIAGNOSIS — E1121 Type 2 diabetes mellitus with diabetic nephropathy: Secondary | ICD-10-CM | POA: Diagnosis not present

## 2023-08-24 DIAGNOSIS — I82409 Acute embolism and thrombosis of unspecified deep veins of unspecified lower extremity: Secondary | ICD-10-CM | POA: Diagnosis not present

## 2023-08-24 DIAGNOSIS — E538 Deficiency of other specified B group vitamins: Secondary | ICD-10-CM | POA: Diagnosis not present

## 2023-08-24 DIAGNOSIS — I10 Essential (primary) hypertension: Secondary | ICD-10-CM | POA: Diagnosis not present

## 2023-08-24 DIAGNOSIS — H9193 Unspecified hearing loss, bilateral: Secondary | ICD-10-CM | POA: Diagnosis not present

## 2023-08-24 DIAGNOSIS — E1122 Type 2 diabetes mellitus with diabetic chronic kidney disease: Secondary | ICD-10-CM | POA: Diagnosis not present

## 2023-08-24 DIAGNOSIS — M109 Gout, unspecified: Secondary | ICD-10-CM | POA: Diagnosis not present

## 2023-08-24 DIAGNOSIS — Z8673 Personal history of transient ischemic attack (TIA), and cerebral infarction without residual deficits: Secondary | ICD-10-CM | POA: Diagnosis not present

## 2023-08-24 LAB — GLUCOSE, CAPILLARY
Glucose-Capillary: 188 mg/dL — ABNORMAL HIGH (ref 70–99)
Glucose-Capillary: 251 mg/dL — ABNORMAL HIGH (ref 70–99)

## 2023-08-24 MED ORDER — OXYCODONE-ACETAMINOPHEN 5-325 MG PO TABS: 1.0000 | ORAL_TABLET | ORAL | 0 refills | Status: DC | PRN

## 2023-08-24 MED ORDER — OXYCODONE HCL 30 MG PO TABS: 30.0000 mg | ORAL_TABLET | Freq: Three times a day (TID) | ORAL | 0 refills | Status: AC | PRN

## 2023-08-24 NOTE — Progress Notes (Signed)
 Patient ID: Mike Floyd, male   DOB: [birth year], 63 y.o.   MRN: [id number] Plan for discharge to skilled nursing facility today.  Wound VAC to be removed and dry dressing applied prior to discharge.

## 2023-08-24 NOTE — Progress Notes (Signed)
 Julio Ohm MD ordered the removal of wound Vac and to be changed into 4x4 and abd pad.  Patient was medicated prior to removal of dressing.  Patient tolerated the removal of dressing.

## 2023-08-24 NOTE — TOC Progression Note (Addendum)
 Transition of Care [facility]) - Progression Note    Patient Details  Name: Mike Floyd MRN: [id number] Date of Birth: 26-Feb-1961  Transition of Care [facility]) CM/SW Contact  Elspeth Hals, LCSW Phone Number: 08/24/2023, 10:12 AM  Clinical Narrative:   SNF auth approved: [id number], [id number], 3 days: 1/15-1/17.  Per PT/OT, pt does require ambulance transport.  CSW confirmed with [facility] that they can receive pt today.   Expected Discharge Plan: Skilled Nursing Facility Barriers to Discharge: Continued Medical Work up, SNF Pending bed offer  Expected Discharge Plan and Services In-house Referral: Clinical Social Work   Post Acute Care Choice: Skilled Nursing Facility Living arrangements for the past 2 months: Single Family Home Expected Discharge Date: 08/24/23                                     Social Determinants of Health (SDOH) Interventions SDOH Screenings   Food Insecurity: No Food Insecurity (08/19/2023)  Housing: Low Risk  (08/19/2023)  Transportation Needs: No Transportation Needs (08/19/2023)  Utilities: Not At Risk (08/19/2023)  Tobacco Use: Medium Risk (08/19/2023)    Readmission Risk Interventions    05/20/2022    4:09 PM  Readmission Risk Prevention Plan  Post Dischage Appt Complete  Medication Screening Complete  Transportation Screening Complete

## 2023-08-24 NOTE — Progress Notes (Signed)
 AVS reviewed with patient.  All questions answered.  Patient excited for discharge.  Report called to [facility] [place].

## 2023-08-24 NOTE — Discharge Summary (Signed)
 Physician Discharge Summary  Patient ID: Mike Floyd MRN: [id number] DOB/AGE: 1961-05-31 63 y.o.  Admit date: 08/19/2023 Discharge date: 08/24/2023  Admission Diagnoses:  Principal Problem:   S/P AKA (above knee amputation) unilateral, left [facility])   Discharge Diagnoses:  Same  Past Medical History:  Diagnosis Date   AICD (automatic cardioverter/defibrillator) present 11/2007   Removed in 2018, a. 11/2007 SJM Current VR - single lead ICD  - Removed 2018 - "it was burning me"   Anxiety    CAD (coronary artery disease)    non-obstructive CAD by Cor CT in 2020 // Myoview  3/22: EF 57, small inf-sept defect c/w scar, no ischemia; low risk      Chest pain    a. 10/2007 Cath:  normal Cors.   CKD (chronic kidney disease), stage II    DDD (degenerative disc disease), lumbar    Diabetes mellitus DX: 2010   type 2   Erosive esophagitis    a. per EGD (08/2011), Dr. Homero Luster - Erosive reflux esophagitis improved but not completely healed since previous EGD 3 years ago. Bx showing  ulcerated gatroesophageal junction mucosa. negative for H. pylori   GERD (gastroesophageal reflux disease)    Gout    Hearing deficit    a. wear bilateral hearing aides   History of hiatal hernia    History of kidney stones    passed stones, no surgery   Hypertension    Mildly dilatd aortic root (HCC)    CMR 4/22: EF 52, no LGE; d/w Dr. Darra Elliot root 38 mm (mildly dilated)   Myocardial infarction [facility]) 2011   Neuropathy    Feet and legs   Nonischemic dilated cardiomyopathy (HCC)    a. H/O EF as low as 35-40% by LV gram 10/2007;  b. Echo 02/2011 EF 50-55%, inf HK, Gr 1 DD // CMR 4/22: EF 52, no LGE; d/w Dr. Darra Elliot root 38 mm (mildly dilated)    Renal insufficiency    Sleep apnea    pt doesnt use, states"I cant afford one". PCP aware   Stroke (HCC)    mini-stroke in 2014   TIA (transient ischemic attack)    July, 2013   Tobacco abuse, in remission 06/27/2009   Discontinued in 2009     Wears dentures     top plate   WPW (Wolff-Parkinson-White syndrome)    a. s/p RFCA @ [facility] - 1999    Surgeries: Procedure(s): LEFT ABOVE KNEE AMPUTATION on 08/19/2023   Consultants:   Discharged Condition: Improved  Hospital Course: Mike Floyd is an 63 y.o. male who was admitted 08/19/2023 with a chief complaint of No chief complaint on file. , and found to have a diagnosis of S/P AKA (above knee amputation) unilateral, left (HCC).  They were brought to the operating room on 08/19/2023 and underwent the above named procedures.    They were given perioperative antibiotics:  Anti-infectives (From admission, onward)    Start     Dose/Rate Route Frequency Ordered Stop   08/19/23 1300  ceFAZolin  (ANCEF ) IVPB 2g/100 mL premix        2 g 200 mL/hr over 30 Minutes Intravenous Every 8 hours 08/19/23 1213 08/19/23 2255   08/19/23 0715  ceFAZolin  (ANCEF ) IVPB 2g/100 mL premix  Status:  Discontinued        2 g 200 mL/hr over 30 Minutes Intravenous On call to O.R. 08/19/23 4098 08/19/23 1208     .  They were given compression stockings, early ambulation, and chemoprophylaxis for  DVT prophylaxis.  They benefited maximally from their hospital stay and there were no complications.    Recent vital signs:  Vitals:   08/23/23 1954 08/24/23 0525  BP: 95/63 113/68  Pulse: 100 94  Resp: 18 18  Temp: 98.3 F (36.8 C) 98.3 F (36.8 C)  SpO2: 93% 97%    Recent laboratory studies:  Results for orders placed or performed during the hospital encounter of 08/19/23  Glucose, capillary   Collection Time: 08/19/23  7:16 AM  Result Value Ref Range   Glucose-Capillary 248 (H) 70 - 99 mg/dL  Basic metabolic panel per protocol   Collection Time: 08/19/23  8:08 AM  Result Value Ref Range   Sodium 134 (L) 135 - 145 mmol/L   Potassium 4.2 3.5 - 5.1 mmol/L   Chloride 100 98 - 111 mmol/L   CO2 20 (L) 22 - 32 mmol/L   Glucose, Bld 241 (H) 70 - 99 mg/dL   BUN 8 8 - 23 mg/dL   Creatinine, Ser 1.61 0.61 - 1.24  mg/dL   Calcium  9.6 8.9 - 10.3 mg/dL   GFR, Estimated >09 >60 mL/min   Anion gap 14 5 - 15  CBC per protocol   Collection Time: 08/19/23  8:08 AM  Result Value Ref Range   WBC 6.4 4.0 - 10.5 K/uL   RBC 5.37 4.22 - 5.81 MIL/uL   Hemoglobin 15.5 13.0 - 17.0 g/dL   HCT 45.4 09.8 - 11.9 %   MCV 84.4 80.0 - 100.0 fL   MCH 28.9 26.0 - 34.0 pg   MCHC 34.2 30.0 - 36.0 g/dL   RDW 14.7 82.9 - 56.2 %   Platelets 138 (L) 150 - 400 K/uL   nRBC 0.0 0.0 - 0.2 %  Surgical pathology   Collection Time: 08/19/23 10:49 AM  Result Value Ref Range   SURGICAL PATHOLOGY      SURGICAL PATHOLOGY CASE: [id number] PATIENT: Mike Floyd Surgical Pathology Report     Clinical History: dehiscence left below knee amputation (cm)     FINAL MICROSCOPIC DIAGNOSIS:  A. LEG, LEFT ABOVE KNEE PORTION, AMPUTATION:      Skin ulcer with hyperparakeratosis.      Subcutaneous acute inflammation, abscess formation and necrosis.      Moderate atherosclerosis with calcifications.      No acute osteomyelitis identified.      Surgical margin of resection is viable without necrosis.   GROSS DESCRIPTION:  A. Received fresh labeled with the patients name and "Portion of left leg" is a lower limb joint with previous blow the knee amputation consisting of the proximal 19.2 x 9.8 cm calf and the distal 12.0 x 9.7 cm of thigh, covered in light tan skin. Tissue at the margin is grossly viable. Opaque red fluid oozes from the red-purple incision site. No gross calcification of the vessels is evident and the lumina are completely patent. Block Summary A1:  vessels and other soft tissue at the margin A2: previous amputation site A3: sampling of vasculature (LEF 08/19/2023)  Final Diagnosis performed by Dillard Frame, MD.   Electronically signed 08/22/2023 Technical and / or Professional components performed at [facility]. [facility], [address] [place], [place] [address].  Immunohistochemistry Technical  component (if applicable) was performed at [facility]. [address], [place], [place] [address].   IMMUNOHISTOCHEMISTRY DISCLAIMER (if applicable): Some of these immunohistochemical stains may have been developed and the performance characteristics determine by [facility]. Some may not  have been cleared or approved by the U.S. Food and Drug Administration. The FDA has determined that such clearance or approval is not necessary. This test is used for clinical purposes. It should not be regarded as investigational or for research. This laboratory is certified under the Cli nical Laboratory Improvement Amendments of 1988 (CLIA-88) as qualified to perform high complexity clinical laboratory testing.  The controls stained appropriately.   IHC stains are performed on formalin fixed, paraffin embedded tissue using a 3,3"diaminobenzidine (DAB) chromogen and Leica Bond Autostainer System. The staining intensity of the nucleus is score manually and is reported as the percentage of tumor cell nuclei demonstrating specific nuclear staining. The specimens are fixed in 10% Neutral Formalin for at least 6 hours and up to 72hrs. These tests are validated on decalcified tissue. Results should be interpreted with caution given the possibility of false negative results on decalcified specimens. Antibody Clones are as follows ER-clone [id number], PR-clone 16, Ki67- clone MM1. Some of these immunohistochemical stains may have been developed and the performance characteristics determined by [facility].   Glucose, capillary   Collection Time: 08/19/23 11:11 AM  Result Value Ref Range   Glucose-Capillary 187 (H) 70 - 99 mg/dL  Glucose, capillary   Collection Time: 08/19/23  5:53 PM  Result Value Ref Range   Glucose-Capillary 318 (H) 70 - 99 mg/dL  Glucose, capillary   Collection Time: 08/19/23  9:27 PM  Result Value Ref Range   Glucose-Capillary 233 (H) 70 -  99 mg/dL  Glucose, capillary   Collection Time: 08/20/23  9:32 AM  Result Value Ref Range   Glucose-Capillary 292 (H) 70 - 99 mg/dL  Glucose, capillary   Collection Time: 08/20/23 11:55 AM  Result Value Ref Range   Glucose-Capillary 316 (H) 70 - 99 mg/dL  Glucose, capillary   Collection Time: 08/20/23  5:15 PM  Result Value Ref Range   Glucose-Capillary 253 (H) 70 - 99 mg/dL  Glucose, capillary   Collection Time: 08/20/23  9:54 PM  Result Value Ref Range   Glucose-Capillary 199 (H) 70 - 99 mg/dL  Glucose, capillary   Collection Time: 08/21/23  6:28 AM  Result Value Ref Range   Glucose-Capillary 213 (H) 70 - 99 mg/dL   Comment 1 Notify RN   Glucose, capillary   Collection Time: 08/21/23 11:14 AM  Result Value Ref Range   Glucose-Capillary 199 (H) 70 - 99 mg/dL  Glucose, capillary   Collection Time: 08/21/23  3:57 PM  Result Value Ref Range   Glucose-Capillary 182 (H) 70 - 99 mg/dL  Glucose, capillary   Collection Time: 08/21/23  9:25 PM  Result Value Ref Range   Glucose-Capillary 209 (H) 70 - 99 mg/dL  Glucose, capillary   Collection Time: 08/22/23  6:19 AM  Result Value Ref Range   Glucose-Capillary 199 (H) 70 - 99 mg/dL  Glucose, capillary   Collection Time: 08/22/23 11:28 AM  Result Value Ref Range   Glucose-Capillary 243 (H) 70 - 99 mg/dL  Glucose, capillary   Collection Time: 08/22/23  4:23 PM  Result Value Ref Range   Glucose-Capillary 210 (H) 70 - 99 mg/dL  Glucose, capillary   Collection Time: 08/22/23  8:56 PM  Result Value Ref Range   Glucose-Capillary 314 (H) 70 - 99 mg/dL  Glucose, capillary   Collection Time: 08/23/23  6:17 AM  Result Value Ref Range   Glucose-Capillary 191 (H) 70 - 99 mg/dL  Glucose, capillary   Collection Time: 08/23/23  7:51 AM  Result Value Ref Range   Glucose-Capillary 226 (H) 70 - 99 mg/dL  Glucose, capillary   Collection Time: 08/23/23 11:10 AM  Result Value Ref Range   Glucose-Capillary 251 (H) 70 - 99 mg/dL  Glucose,  capillary   Collection Time: 08/23/23  4:04 PM  Result Value Ref Range   Glucose-Capillary 205 (H) 70 - 99 mg/dL  Glucose, capillary   Collection Time: 08/24/23  6:07 AM  Result Value Ref Range   Glucose-Capillary 188 (H) 70 - 99 mg/dL    Discharge Medications:   Allergies as of 08/24/2023   No Known Allergies      Medication List     STOP taking these medications    doxycycline  100 MG tablet Commonly known as: VIBRA -TABS       TAKE these medications    aspirin  81 MG tablet Take 81 mg by mouth daily.   atorvastatin  20 MG tablet Commonly known as: LIPITOR Take 20 mg by mouth in the morning.   carvedilol  12.5 MG tablet Commonly known as: COREG  TAKE 1 TABLET BY MOUTH TWICE DAILY.   cyanocobalamin  1000 MCG tablet Commonly known as: VITAMIN B12 Take 1,000 mcg by mouth daily.   famotidine  20 MG tablet Commonly known as: PEPCID  Take 20 mg by mouth daily.   furosemide  20 MG tablet Commonly known as: Lasix  Take 1 tablet (20 mg total) by mouth daily.   gabapentin  600 MG tablet Commonly known as: NEURONTIN  Take 600 mg by mouth 3 (three) times daily.   insulin  glargine 100 UNIT/ML injection Commonly known as: LANTUS  Inject 20 Units into the skin at bedtime.   isosorbide  mononitrate 30 MG 24 hr tablet Commonly known as: IMDUR  Take 1 tablet (30 mg total) by mouth daily.   nitroGLYCERIN  0.4 MG SL tablet Commonly known as: NITROSTAT  Place 1 tablet (0.4 mg total) under the tongue every 5 (five) minutes as needed for chest pain.   NovoLOG  Mix 70/30 FlexPen (70-30) 100 UNIT/ML FlexPen Generic drug: insulin  aspart protamine - aspart Inject 52 Units into the skin 2 (two) times daily.   oxycodone  30 MG immediate release tablet Commonly known as: ROXICODONE  Take 1 tablet (30 mg total) by mouth every 8 (eight) hours as needed for pain. What changed:  when to take this reasons to take this   oxyCODONE -acetaminophen  5-325 MG tablet Commonly known as:  PERCOCET/ROXICET Take 1 tablet by mouth every 4 (four) hours as needed.   pantoprazole  40 MG tablet Commonly known as: PROTONIX  Take 40 mg by mouth daily.   potassium chloride  SA 20 MEQ tablet Commonly known as: KLOR-CON  M TAKE (1) TABLET BY MOUTH ONCE DAILY.   sucralfate  1 g tablet Commonly known as: Carafate  Take 1 tablet (1 g total) by mouth with breakfast, with lunch, and with evening meal for 7 days.   tamsulosin  0.4 MG Caps capsule Commonly known as: FLOMAX  Take 0.4 mg by mouth daily. am   zolpidem  10 MG tablet Commonly known as: AMBIEN  Take 10 mg by mouth at bedtime.        Diagnostic Studies: No results found.  Disposition: Discharge disposition: 62-Rehab Facility       Discharge Instructions     Call MD / Call 911   Complete by: As directed    If you experience chest pain or shortness of breath, CALL 911 and be transported to the hospital emergency room.  If you develope a fever above 101 F, pus (white drainage) or increased drainage or redness at the wound, or  calf pain, call your surgeon's office.   Constipation Prevention   Complete by: As directed    Drink plenty of fluids.  Prune juice may be helpful.  You may use a stool softener, such as Colace (over the counter) 100 mg twice a day.  Use MiraLax  (over the counter) for constipation as needed.   Diet - low sodium heart healthy   Complete by: As directed    Increase activity slowly as tolerated   Complete by: As directed    Post-operative opioid taper instructions:   Complete by: As directed    POST-OPERATIVE OPIOID TAPER INSTRUCTIONS: It is important to wean off of your opioid medication as soon as possible. If you do not need pain medication after your surgery it is ok to stop day one. Opioids include: Codeine , Hydrocodone (Norco, Vicodin), Oxycodone (Percocet, oxycontin ) and hydromorphone  amongst others.  Long term and even short term use of opiods can cause: Increased pain  response Dependence Constipation Depression Respiratory depression And more.  Withdrawal symptoms can include Flu like symptoms Nausea, vomiting And more Techniques to manage these symptoms Hydrate well Eat regular healthy meals Stay active Use relaxation techniques(deep breathing, meditating, yoga) Do Not substitute Alcohol to help with tapering If you have been on opioids for less than two weeks and do not have pain than it is ok to stop all together.  Plan to wean off of opioids This plan should start within one week post op of your joint replacement. Maintain the same interval or time between taking each dose and first decrease the dose.  Cut the total daily intake of opioids by one tablet each day Next start to increase the time between doses. The last dose that should be eliminated is the evening dose.           Follow-up Information     Timothy Ford, MD Follow up in 1 week(s).   Specialty: Orthopedic Surgery Contact information: [address] [place] [address] [phone]                  Signed: Timothy Ford 08/24/2023, 6:54 AM

## 2023-08-24 NOTE — TOC Transition Note (Signed)
 Transition of Care [facility]) - Discharge Note   Patient Details  Name: Mike Floyd MRN: [id number] Date of Birth: [birth year]  Transition of Care [facility]) CM/SW Contact:  Elspeth Hals, LCSW Phone Number: 08/24/2023, 11:02 AM   Clinical Narrative:   Pt discharging to [facility].  RN call report to [phone].    Final next level of care: Skilled Nursing Facility Barriers to Discharge: Barriers Resolved   Patient Goals and CMS Choice Patient states their goals for this hospitalization and ongoing recovery are:: walk again CMS Medicare.gov Compare Post Acute Care list provided to:: Patient Choice offered to / list presented to : Patient      Discharge Placement              Patient chooses bed at:  [facility]) Patient to be transferred to facility by: ptar Name of family member notified: wife [name] Patient and family notified of of transfer: 08/24/23  Discharge Plan and Services Additional resources added to the After Visit Summary for   In-house Referral: Clinical Social Work   Post Acute Care Choice: Skilled Nursing Facility                               Social Drivers of Health (SDOH) Interventions SDOH Screenings   Food Insecurity: No Food Insecurity (08/19/2023)  Housing: Low Risk  (08/19/2023)  Transportation Needs: No Transportation Needs (08/19/2023)  Utilities: Not At Risk (08/19/2023)  Tobacco Use: Medium Risk (08/19/2023)     Readmission Risk Interventions    05/20/2022    4:09 PM  Readmission Risk Prevention Plan  Post Dischage Appt Complete  Medication Screening Complete  Transportation Screening Complete

## 2023-08-24 NOTE — Progress Notes (Signed)
 Occupational Therapy Treatment Patient Details Name: Mike Floyd MRN: [id number] DOB: 1961-07-31 Today's Date: 08/24/2023   History of present illness Pt is a 63 y/o male admitted to [facility] 08/19/23 for  dehiscence of L BKA stump. S/p revision of L BKA to AKA 08/19/23. PMHx: poorly controlled type 2 diabetes, GERD, hypertension, dyslipidemia, CAD, L BKA 06/17/23, and neuropathy.   OT comments  Patient stating pain felt managed this am and agreeable to OT treatment. Patient able to get to EOB with CGA and min assist for transfer to [facility] and able to perform toilet hygiene seated. Patient with increased complaints of pain following transfers. Patient will benefit from continued inpatient follow up therapy, <3 hours/day to continue to address bathing, dressing, and toilet transfers. Acute OT to continue to follow to address LB bathing and dressing.       If plan is discharge home, recommend the following:  A little help with walking and/or transfers;A little help with bathing/dressing/bathroom   Equipment Recommendations  Other (comment) (defer)    Recommendations for Other Services      Precautions / Restrictions Precautions Precautions: Fall Precaution Comments: L wound vac Restrictions Weight Bearing Restrictions Per Provider Order: Yes LLE Weight Bearing Per Provider Order: Non weight bearing Other Position/Activity Restrictions: Left AKA       Mobility Bed Mobility Overal bed mobility: Needs Assistance Bed Mobility: Supine to Sit     Supine to sit: Contact guard     General bed mobility comments: increased time and effort    Transfers Overall transfer level: Needs assistance Equipment used: Rolling walker (2 wheels) Transfers: Sit to/from Stand, Bed to chair/wheelchair/BSC Sit to Stand: Min assist     Step pivot transfers: Min assist     General transfer comment: min assist to steady during transfer and cues for hand placement     Balance Overall balance  assessment: Needs assistance Sitting-balance support: Bilateral upper extremity supported Sitting balance-Leahy Scale: Fair Sitting balance - Comments: EOB   Standing balance support: Bilateral upper extremity supported, During functional activity, Reliant on assistive device for balance Standing balance-Leahy Scale: Poor Standing balance comment: heavy reliance on RW                           ADL either performed or assessed with clinical judgement   ADL Overall ADL's : Needs assistance/impaired                         Toilet Transfer: Minimal assistance;Stand-pivot;Rolling walker (2 wheels);BSC/3in1 Toilet Transfer Details (indicate cue type and reason): cues for hand placement Toileting- Clothing Manipulation and Hygiene: Minimal assistance;Sitting/lateral lean         General ADL Comments: limited by pain    Extremity/Trunk Assessment              Vision       Perception     Praxis      Cognition Arousal: Alert Behavior During Therapy: WFL for tasks assessed/performed Overall Cognitive Status: Within Functional Limits for tasks assessed                                 General Comments: Eager to participate with pain controlled, increased pain with transfers        Exercises      Shoulder Instructions       General Comments wound VAC  in place but expected to be removed today    Pertinent Vitals/ Pain       Pain Assessment Pain Assessment: Faces Faces Pain Scale: Hurts whole lot Pain Location: left residual limb Pain Descriptors / Indicators: Operative site guarding, Grimacing, Cramping, Constant, Tightness Pain Intervention(s): Limited activity within patient's tolerance, Monitored during session, Repositioned, Patient requesting pain meds-RN notified  Home Living                                          Prior Functioning/Environment              Frequency  Min 2X/week         Progress Toward Goals  OT Goals(current goals can now be found in the care plan section)  Progress towards OT goals: Progressing toward goals  Acute Rehab OT Goals Patient Stated Goal: less pain OT Goal Formulation: With patient Time For Goal Achievement: 09/03/23 Potential to Achieve Goals: Good ADL Goals Pt Will Perform Lower Body Dressing: with modified independence;sit to/from stand Pt Will Transfer to Toilet: with modified independence;ambulating Additional ADL Goal #1: Pt will demonstrate independence with desensitization strategies of LLE.  Plan      Co-evaluation                 AM-PAC OT "6 Clicks" Daily Activity     Outcome Measure   Help from another person eating meals?: None Help from another person taking care of personal grooming?: A Little Help from another person toileting, which includes using toliet, bedpan, or urinal?: A Little Help from another person bathing (including washing, rinsing, drying)?: A Little Help from another person to put on and taking off regular upper body clothing?: A Little Help from another person to put on and taking off regular lower body clothing?: A Little 6 Click Score: 19    End of Session Equipment Utilized During Treatment: Gait belt;Rolling walker (2 wheels)  OT Visit Diagnosis: Unsteadiness on feet (R26.81);Muscle weakness (generalized) (M62.81);Pain Pain - Right/Left: Left Pain - part of body: Leg   Activity Tolerance Patient limited by pain   Patient Left in chair;with call bell/phone within reach;with chair alarm set   Nurse Communication Mobility status;Patient requests pain meds        Time: 0715-0739 OT Time Calculation (min): 24 min  Charges: OT General Charges $OT Visit: 1 Visit OT Treatments $Self Care/Home Management : 23-37 mins  Anitra Barn, OTA Acute Rehabilitation Services  Office [phone]   Jovita Nipper 08/24/2023, 7:51 AM

## 2023-08-24 NOTE — Plan of Care (Signed)
  Problem: Education: Goal: Knowledge of General Education information will improve Description: Including pain rating scale, medication(s)/side effects and non-pharmacologic comfort measures Outcome: Progressing   Problem: Health Behavior/Discharge Planning: Goal: Ability to manage health-related needs will improve Outcome: Progressing   Problem: Clinical Measurements: Goal: Ability to maintain clinical measurements within normal limits will improve Outcome: Progressing Goal: Will remain free from infection Outcome: Progressing Goal: Diagnostic test results will improve Outcome: Progressing Goal: Respiratory complications will improve Outcome: Progressing Goal: Cardiovascular complication will be avoided Outcome: Progressing   Problem: Activity: Goal: Risk for activity intolerance will decrease Outcome: Progressing   Problem: Nutrition: Goal: Adequate nutrition will be maintained Outcome: Progressing   Problem: Coping: Goal: Level of anxiety will decrease Outcome: Progressing   Problem: Elimination: Goal: Will not experience complications related to bowel motility Outcome: Progressing Goal: Will not experience complications related to urinary retention Outcome: Progressing   Problem: Pain Management: Goal: General experience of comfort will improve Outcome: Progressing   Problem: Safety: Goal: Ability to remain free from injury will improve Outcome: Progressing   Problem: Skin Integrity: Goal: Risk for impaired skin integrity will decrease Outcome: Progressing   Problem: Education: Goal: Ability to describe self-care measures that may prevent or decrease complications (Diabetes Survival Skills Education) will improve Outcome: Progressing Goal: Individualized Educational Video(s) Outcome: Progressing   Problem: Coping: Goal: Ability to adjust to condition or change in health will improve Outcome: Progressing   Problem: Fluid Volume: Goal: Ability to  maintain a balanced intake and output will improve Outcome: Progressing   Problem: Health Behavior/Discharge Planning: Goal: Ability to identify and utilize available resources and services will improve Outcome: Progressing Goal: Ability to manage health-related needs will improve Outcome: Progressing   Problem: Metabolic: Goal: Ability to maintain appropriate glucose levels will improve Outcome: Progressing   Problem: Nutritional: Goal: Maintenance of adequate nutrition will improve Outcome: Progressing Goal: Progress toward achieving an optimal weight will improve Outcome: Progressing   Problem: Skin Integrity: Goal: Risk for impaired skin integrity will decrease Outcome: Progressing   Problem: Tissue Perfusion: Goal: Adequacy of tissue perfusion will improve Outcome: Progressing   Problem: Education: Goal: Knowledge of the prescribed therapeutic regimen will improve Outcome: Progressing Goal: Ability to verbalize activity precautions or restrictions will improve Outcome: Progressing Goal: Understanding of discharge needs will improve Outcome: Progressing   Problem: Activity: Goal: Ability to perform//tolerate increased activity and mobilize with assistive devices will improve Outcome: Progressing   Problem: Clinical Measurements: Goal: Postoperative complications will be avoided or minimized Outcome: Progressing   Problem: Self-Care: Goal: Ability to meet self-care needs will improve Outcome: Progressing   Problem: Self-Concept: Goal: Ability to maintain and perform role responsibilities to the fullest extent possible will improve Outcome: Progressing   Problem: Pain Management: Goal: Pain level will decrease with appropriate interventions Outcome: Progressing   Problem: Skin Integrity: Goal: Demonstration of wound healing without infection will improve Outcome: Progressing

## 2023-08-31 ENCOUNTER — Encounter: Payer: Medicare Other | Admitting: Family

## 2023-09-01 DIAGNOSIS — I77810 Thoracic aortic ectasia: Secondary | ICD-10-CM | POA: Diagnosis not present

## 2023-09-01 DIAGNOSIS — E1129 Type 2 diabetes mellitus with other diabetic kidney complication: Secondary | ICD-10-CM | POA: Diagnosis not present

## 2023-09-01 DIAGNOSIS — N1831 Chronic kidney disease, stage 3a: Secondary | ICD-10-CM | POA: Diagnosis not present

## 2023-09-01 DIAGNOSIS — G894 Chronic pain syndrome: Secondary | ICD-10-CM | POA: Diagnosis not present

## 2023-09-01 DIAGNOSIS — E1165 Type 2 diabetes mellitus with hyperglycemia: Secondary | ICD-10-CM | POA: Diagnosis not present

## 2023-09-01 DIAGNOSIS — E1140 Type 2 diabetes mellitus with diabetic neuropathy, unspecified: Secondary | ICD-10-CM | POA: Diagnosis not present

## 2023-09-01 DIAGNOSIS — I43 Cardiomyopathy in diseases classified elsewhere: Secondary | ICD-10-CM | POA: Diagnosis not present

## 2023-09-05 ENCOUNTER — Ambulatory Visit (INDEPENDENT_AMBULATORY_CARE_PROVIDER_SITE_OTHER): Payer: Medicare Other | Admitting: Orthopedic Surgery

## 2023-09-05 ENCOUNTER — Encounter: Payer: Self-pay | Admitting: Orthopedic Surgery

## 2023-09-05 DIAGNOSIS — Z89612 Acquired absence of left leg above knee: Secondary | ICD-10-CM

## 2023-09-05 NOTE — Progress Notes (Signed)
Office Visit Note   Patient: Mike Floyd           Date of Birth: [birth year]           MRN: [id number] Visit Date: 09/05/2023              Requested by: Elfredia Nevins, MD [address] [place],  [place] [address] PCP: Elfredia Nevins, MD  Chief Complaint  Patient presents with   Left Leg - Routine Post Op    08/19/23 left AKA      HPI: Patient is a 63 year old gentleman who presents 2 weeks status post left above-knee amputation.  The wound VAC was removed at the time of discharge.  Patient states he recently sustained blunt trauma on the corner of a dresser to the residual limb.  Patient is currently on 30 mg oxycodone twice a day.  Assessment & Plan: Visit Diagnoses:  1. Hx of AKA (above knee amputation), left (HCC)     Plan: Dial soap cleansing dry dressing changes daily.  A prescription was provided for [facility] for stump shrinker and a prosthetic limb.  Follow-Up Instructions: No follow-ups on file.   Ortho Exam  Patient is alert, oriented, no adenopathy, well-dressed, normal affect, normal respiratory effort. Examination there is swelling.  The residual limb is well consolidated there is no cellulitis no drainage.  Patient has resolving ecchymosis.  Patient left skilled nursing facility early secondary to the conditions and care at the facility.  Patient is a new left transfemoral amputee.  Patient's current comorbidities are not expected to impact the ability to function with the prescribed prosthesis. Patient verbally communicates a strong desire to use a prosthesis. Patient currently requires mobility aids to ambulate without a prosthesis.  Expects not to use mobility aids with a new prosthesis.  Patient is a K3 level ambulator that spends a lot of time walking around on uneven terrain over obstacles, up and down stairs, and ambulates with a variable cadence.     Imaging: No results found.   Labs: Lab Results  Component Value Date   HGBA1C 7.7 (H)  06/17/2023   HGBA1C 9.7 (H) 05/13/2022   HGBA1C 7.0 (H) 06/15/2017   ESRSEDRATE 2 03/14/2023   CRP <1 03/14/2023   CRP 0.6 05/16/2022   CRP 2.2 06/27/2019   LABURIC 5.5 03/07/2012   REPTSTATUS 05/18/2022 FINAL 05/13/2022   CULT  05/13/2022    NO GROWTH 5 DAYS Performed at [facility], [address] [place], [place] [address]      Lab Results  Component Value Date   ALBUMIN 4.0 06/17/2023   ALBUMIN 4.4 05/04/2023   ALBUMIN 4.1 02/26/2023   PREALBUMIN 23 06/17/2023    Lab Results  Component Value Date   MG 1.8 02/26/2023   MG 1.7 05/20/2022   MG 2.0 05/19/2022   Lab Results  Component Value Date   VD25OH 18 (L) 06/15/2017    Lab Results  Component Value Date   PREALBUMIN 23 06/17/2023      Latest Ref Rng & Units 08/19/2023    8:08 AM 07/22/2023    9:33 AM 06/17/2023    8:40 AM  CBC EXTENDED  WBC 4.0 - 10.5 K/uL 6.4  8.4  5.3   RBC 4.22 - 5.81 MIL/uL 5.37  4.88  4.71   Hemoglobin 13.0 - 17.0 g/dL 65.7  84.6  96.2   HCT 39.0 - 52.0 % 45.3  40.8  39.7   Platelets 150 - 400 K/uL 138  164  148   NEUT# 1.7 - 7.7 K/uL   3.6   Lymph# 0.7 - 4.0 K/uL   1.1      There is no height or weight on file to calculate BMI.  Orders:  No orders of the defined types were placed in this encounter.  No orders of the defined types were placed in this encounter.    Procedures: No procedures performed  Clinical Data: No additional findings.  ROS:  All other systems negative, except as noted in the HPI. Review of Systems  Objective: Vital Signs: There were no vitals taken for this visit.  Specialty Comments:  No specialty comments available.  PMFS History: Patient Active Problem List   Diagnosis Date Noted   S/P AKA (above knee amputation) unilateral, left (HCC) 08/19/2023   Dehiscence of amputation stump of left lower extremity (HCC) 07/22/2023   Ischemia of site of left below knee amputation (HCC) 07/22/2023   S/P BKA (below knee amputation) unilateral, left  (HCC) 07/22/2023   Below-knee amputation of left lower extremity (HCC) 06/17/2023   Acute osteomyelitis of metatarsal bone of left foot (HCC)    Subacute osteomyelitis, left ankle and foot (HCC) 05/13/2022   Non-pressure chronic ulcer of other part of right foot limited to breakdown of skin (HCC)    Contracture of right Achilles tendon    Mildly dilatd aortic root (HCC)    Contracture of left Achilles tendon    Metatarsal deformity, left    CAD (coronary artery disease)    Hallux hammertoe, left 12/11/2019   Diarrhea 06/27/2019   Diastasis recti 08/22/2018   Umbilical hernia without obstruction and without gangrene 08/22/2018   Vitamin D deficiency 06/23/2017   Essential hypertension, benign 06/15/2017   Mixed hyperlipidemia 06/15/2017   Class 1 obesity due to excess calories with serious comorbidity and body mass index (BMI) of 31.0 to 31.9 in adult 06/15/2017   Malfunction of implantable cardioverter-defibrillator (ICD) electrode 10/07/2016   Chronic kidney disease (CKD), stage III (moderate) (HCC) 04/04/2015   Bladder neck obstruction 03/21/2015   Difficult or painful urination 03/21/2015   Flank pain 03/21/2015   Delayed onset of urination 03/21/2015   Calculus of kidney 03/21/2015   Erosive esophagitis 12/27/2013   Hypotension due to drugs 07/02/2013   Chronic systolic heart failure (HCC) 04/24/2013   CAP (community acquired pneumonia) 03/16/2013   HTN (hypertension) 03/15/2013   Hydrocele 03/15/2013   Spermatocele 03/15/2013   DOE (dyspnea on exertion) 03/15/2013   OSA (obstructive sleep apnea) 01/09/2013   Chronic kidney disease, stage 3 (HCC) 01/03/2013   Chest pain 12/26/2012   History of diagnostic tests 09/06/2012   TIA (transient ischemic attack)    Cardiomyopathy, nonischemic (HCC) 02/24/2010   AICD (automatic cardioverter/defibrillator) 02/24/2010   DM type 2 causing vascular disease (HCC) 06/27/2009   Gout 06/27/2009   Tobacco abuse, in remission 06/27/2009    COLONIC POLYPS 10/31/2008   GERD (gastroesophageal reflux disease) 10/31/2008   Past Medical History:  Diagnosis Date   AICD (automatic cardioverter/defibrillator) present 11/2007   Removed in 2018, a. 11/2007 [facility] Current VR - single lead ICD  - Removed 2018 - "it was burning me"   Anxiety    CAD (coronary artery disease)    non-obstructive CAD by Cor CT in 2020 // Myoview 3/22: EF 57, small inf-sept defect c/w scar, no ischemia; low risk      Chest pain    a. 10/2007 Cath:  normal Cors.   CKD (chronic kidney disease), stage II  DDD (degenerative disc disease), lumbar    Diabetes mellitus DX: 2010   type 2   Erosive esophagitis    a. per EGD (08/2011), Dr. Karilyn Cota - Erosive reflux esophagitis improved but not completely healed since previous EGD 3 years ago. Bx showing  ulcerated gatroesophageal junction mucosa. negative for H. pylori   GERD (gastroesophageal reflux disease)    Gout    Hearing deficit    a. wear bilateral hearing aides   History of hiatal hernia    History of kidney stones    passed stones, no surgery   Hypertension    Mildly dilatd aortic root (HCC)    CMR 4/22: EF 52, no LGE; d/w Dr. Lorretta Harp root 38 mm (mildly dilated)   Myocardial infarction [facility]) 2011   Neuropathy    Feet and legs   Nonischemic dilated cardiomyopathy (HCC)    a. H/O EF as low as 35-40% by LV gram 10/2007;  b. Echo 02/2011 EF 50-55%, inf HK, Gr 1 DD // CMR 4/22: EF 52, no LGE; d/w Dr. Lorretta Harp root 38 mm (mildly dilated)    Renal insufficiency    Sleep apnea    pt doesnt use, states"I cant afford one". PCP aware   Stroke (HCC)    mini-stroke in 2014   TIA (transient ischemic attack)    July, 2013   Tobacco abuse, in remission 06/27/2009   Discontinued in 2009     Wears dentures    top plate   WPW (Wolff-Parkinson-White syndrome)    a. s/p RFCA @ [facility] - 1999    Family History  Problem Relation Age of Onset   Heart attack Mother 75   Hypertension Mother    Diabetes Mother     Kidney disease Mother    Breast cancer Mother    Heart attack Father 20   Hypertension Father    Diabetes Father    Stroke Brother    Heart attack Brother 24   Stroke Maternal Grandmother 56   Diabetes Brother    Hypertension Brother     Past Surgical History:  Procedure Laterality Date   AMPUTATION Left 05/19/2022   Procedure: TRANSMETATARSAL AMPUTATION LEFT FOOT;  Surgeon: Nadara Mustard, MD;  Location: [facility] OR;  Service: Orthopedics;  Laterality: Left;   AMPUTATION Left 06/17/2023   Procedure: LEFT BELOW KNEE AMPUTATION;  Surgeon: Nadara Mustard, MD;  Location: [facility] OR;  Service: Orthopedics;  Laterality: Left;   AMPUTATION Left 08/19/2023   Procedure: LEFT ABOVE KNEE AMPUTATION;  Surgeon: Nadara Mustard, MD;  Location: [facility] OR;  Service: Orthopedics;  Laterality: Left;   APPENDECTOMY     BACK SURGERY     1995   CARDIAC CATHETERIZATION     2009   CARDIAC DEFIBRILLATOR PLACEMENT     ICD was removed in 2018   CHOLECYSTECTOMY     CHOLECYSTECTOMY, LAPAROSCOPIC     11/2007   COLONOSCOPY W/ POLYPECTOMY  2009   ELBOW SURGERY Left 06/2010   ESOPHAGEAL DILATION N/A 11/08/2014   Procedure: ESOPHAGEAL DILATION;  Surgeon: Malissa Hippo, MD;  Location: [facility] ORS;  Service: Endoscopy;  Laterality: N/A;  #56,    ESOPHAGOGASTRODUODENOSCOPY  03/31/2012   also 08/2011; [name]   ESOPHAGOGASTRODUODENOSCOPY (EGD) WITH PROPOFOL N/A 11/08/2014   Procedure: ESOPHAGOGASTRODUODENOSCOPY (EGD) WITH PROPOFOL;  Surgeon: Malissa Hippo, MD;  Location: [facility] ORS;  Service: Endoscopy;  Laterality: N/A;  Hiatus is 69 , GE Junction is 37   FOOT ARTHRODESIS Left 10/24/2020   Procedure: LEFT GASTROCNEMIUS  RECESSION, DORSIFLEXION OSTEOTOMY 1ST MT;  Surgeon: Nadara Mustard, MD;  Location: [facility] OR;  Service: Orthopedics;  Laterality: Left;   FOOT ARTHRODESIS Right 01/09/2021   Procedure: CLOSING WEDGE OSTEOTOMY RIGHT 1ST METATARSAL;  Surgeon: Nadara Mustard, MD;  Location: [facility] OR;  Service: Orthopedics;  Laterality: Right;    GASTROCNEMIUS RECESSION Right 01/09/2021   Procedure: RIGHT GASTROCNEMIUS RECESSION;  Surgeon: Nadara Mustard, MD;  Location: [facility] OR;  Service: Orthopedics;  Laterality: Right;   ICD LEAD REMOVAL N/A 10/07/2016   Procedure: ICD LEAD REMOVAL ;  Surgeon: Marinus Maw, MD;  Location: [facility] OR;  Service: Cardiovascular;  Laterality: N/A;   MULTIPLE TOOTH EXTRACTIONS     RADIOFREQUENCY ABLATION  1999   WPW; performed at [facility]   STUMP REVISION Left 07/22/2023   Procedure: REVISION LEFT BELOW KNEE AMPUTATION;  Surgeon: Nadara Mustard, MD;  Location: [facility] OR;  Service: Orthopedics;  Laterality: Left;   TEE WITHOUT CARDIOVERSION N/A 10/07/2016   Procedure: TRANSESOPHAGEAL ECHOCARDIOGRAM (TEE);  Surgeon: Marinus Maw, MD;  Location: [facility] OR;  Service: Cardiovascular;  Laterality: N/A;   Social History   Occupational History   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    Comment: Laid off in 11/11  Tobacco Use   Smoking status: Former    Current packs/day: 0.00    Average packs/day: 1.5 packs/day for 39.1 years (58.6 ttl pk-yrs)    Types: Cigarettes    Start date: 06/30/1971    Quit date: 07/30/2010    Years since quitting: 13.1   Smokeless tobacco: Former    Types: Chew    Quit date: 03/26/1994  Vaping Use   Vaping status: Former   Start date: 07/30/2010   Quit date: 06/29/2016  Substance and Sexual Activity   Alcohol use: No    Alcohol/week: 1.0 standard drink of alcohol    Types: 1 Cans of beer per week    Comment: used "years ago"   Drug use: No   Sexual activity: Yes    Birth control/protection: None

## 2023-09-06 NOTE — Progress Notes (Signed)
Skin ulceration and necrosis is clinically significant

## 2023-09-08 NOTE — Addendum Note (Signed)
Addended by: Javier Glazier on: 09/08/2023 04:09 PM   Modules accepted: Orders

## 2023-09-09 ENCOUNTER — Telehealth: Payer: Self-pay | Admitting: Orthopedic Surgery

## 2023-09-09 DIAGNOSIS — Z89612 Acquired absence of left leg above knee: Secondary | ICD-10-CM | POA: Diagnosis not present

## 2023-09-09 DIAGNOSIS — Z4781 Encounter for orthopedic aftercare following surgical amputation: Secondary | ICD-10-CM | POA: Diagnosis not present

## 2023-09-09 NOTE — Telephone Encounter (Signed)
Mike Floyd (PT) from [facility] health called requesting verbal orders for 1wk 1, 2wk 3, and 1wk 3. Corrie Dandy also requesting or asking Dr Lajoyce Corners if would like nursing for pt wound on right foot. Mike Floyd secure line is [phone].

## 2023-09-13 NOTE — Telephone Encounter (Signed)
I called and sw Mary and gave verbal ok for orders as request below. Pt has an appt on 09/20/2023 will update of any changes at that time.

## 2023-09-14 DIAGNOSIS — I2510 Atherosclerotic heart disease of native coronary artery without angina pectoris: Secondary | ICD-10-CM | POA: Diagnosis not present

## 2023-09-14 DIAGNOSIS — Z794 Long term (current) use of insulin: Secondary | ICD-10-CM | POA: Diagnosis not present

## 2023-09-14 DIAGNOSIS — Z89612 Acquired absence of left leg above knee: Secondary | ICD-10-CM | POA: Diagnosis not present

## 2023-09-14 DIAGNOSIS — I130 Hypertensive heart and chronic kidney disease with heart failure and stage 1 through stage 4 chronic kidney disease, or unspecified chronic kidney disease: Secondary | ICD-10-CM | POA: Diagnosis not present

## 2023-09-14 DIAGNOSIS — I5022 Chronic systolic (congestive) heart failure: Secondary | ICD-10-CM | POA: Diagnosis not present

## 2023-09-14 DIAGNOSIS — E1140 Type 2 diabetes mellitus with diabetic neuropathy, unspecified: Secondary | ICD-10-CM | POA: Diagnosis not present

## 2023-09-14 DIAGNOSIS — N183 Chronic kidney disease, stage 3 unspecified: Secondary | ICD-10-CM | POA: Diagnosis not present

## 2023-09-14 DIAGNOSIS — I420 Dilated cardiomyopathy: Secondary | ICD-10-CM | POA: Diagnosis not present

## 2023-09-14 DIAGNOSIS — E1122 Type 2 diabetes mellitus with diabetic chronic kidney disease: Secondary | ICD-10-CM | POA: Diagnosis not present

## 2023-09-14 DIAGNOSIS — T8131XA Disruption of external operation (surgical) wound, not elsewhere classified, initial encounter: Secondary | ICD-10-CM | POA: Diagnosis not present

## 2023-09-16 ENCOUNTER — Telehealth: Payer: Self-pay | Admitting: Orthopedic Surgery

## 2023-09-16 DIAGNOSIS — Z89612 Acquired absence of left leg above knee: Secondary | ICD-10-CM | POA: Diagnosis not present

## 2023-09-16 DIAGNOSIS — N183 Chronic kidney disease, stage 3 unspecified: Secondary | ICD-10-CM | POA: Diagnosis not present

## 2023-09-16 DIAGNOSIS — I5022 Chronic systolic (congestive) heart failure: Secondary | ICD-10-CM | POA: Diagnosis not present

## 2023-09-16 DIAGNOSIS — E1122 Type 2 diabetes mellitus with diabetic chronic kidney disease: Secondary | ICD-10-CM | POA: Diagnosis not present

## 2023-09-16 DIAGNOSIS — T8131XA Disruption of external operation (surgical) wound, not elsewhere classified, initial encounter: Secondary | ICD-10-CM | POA: Diagnosis not present

## 2023-09-16 DIAGNOSIS — I2510 Atherosclerotic heart disease of native coronary artery without angina pectoris: Secondary | ICD-10-CM | POA: Diagnosis not present

## 2023-09-16 DIAGNOSIS — I420 Dilated cardiomyopathy: Secondary | ICD-10-CM | POA: Diagnosis not present

## 2023-09-16 DIAGNOSIS — Z794 Long term (current) use of insulin: Secondary | ICD-10-CM | POA: Diagnosis not present

## 2023-09-16 DIAGNOSIS — E1140 Type 2 diabetes mellitus with diabetic neuropathy, unspecified: Secondary | ICD-10-CM | POA: Diagnosis not present

## 2023-09-16 DIAGNOSIS — I130 Hypertensive heart and chronic kidney disease with heart failure and stage 1 through stage 4 chronic kidney disease, or unspecified chronic kidney disease: Secondary | ICD-10-CM | POA: Diagnosis not present

## 2023-09-16 NOTE — Telephone Encounter (Signed)
 Mike Floyd, HHPT called stating that pt has more redness lateral side of AKA. More drainage, on and off bleeding and yellow pus around staples. Pt does have an apt Tuesday next week per PT. Would you like to send in abx prior to apt?

## 2023-09-20 ENCOUNTER — Telehealth: Payer: Self-pay | Admitting: Orthopedic Surgery

## 2023-09-20 ENCOUNTER — Ambulatory Visit (INDEPENDENT_AMBULATORY_CARE_PROVIDER_SITE_OTHER): Payer: Medicare Other | Admitting: Orthopedic Surgery

## 2023-09-20 DIAGNOSIS — M86172 Other acute osteomyelitis, left ankle and foot: Secondary | ICD-10-CM | POA: Diagnosis not present

## 2023-09-20 DIAGNOSIS — Z89612 Acquired absence of left leg above knee: Secondary | ICD-10-CM

## 2023-09-20 NOTE — Telephone Encounter (Signed)
Mike Floyd from [facility] called she would like verbal orders for nursing to start next week. Cb# [phone]

## 2023-09-21 DIAGNOSIS — I2510 Atherosclerotic heart disease of native coronary artery without angina pectoris: Secondary | ICD-10-CM | POA: Diagnosis not present

## 2023-09-21 DIAGNOSIS — Z794 Long term (current) use of insulin: Secondary | ICD-10-CM | POA: Diagnosis not present

## 2023-09-21 DIAGNOSIS — E1122 Type 2 diabetes mellitus with diabetic chronic kidney disease: Secondary | ICD-10-CM | POA: Diagnosis not present

## 2023-09-21 DIAGNOSIS — I5022 Chronic systolic (congestive) heart failure: Secondary | ICD-10-CM | POA: Diagnosis not present

## 2023-09-21 DIAGNOSIS — T8131XA Disruption of external operation (surgical) wound, not elsewhere classified, initial encounter: Secondary | ICD-10-CM | POA: Diagnosis not present

## 2023-09-21 DIAGNOSIS — I130 Hypertensive heart and chronic kidney disease with heart failure and stage 1 through stage 4 chronic kidney disease, or unspecified chronic kidney disease: Secondary | ICD-10-CM | POA: Diagnosis not present

## 2023-09-21 DIAGNOSIS — Z89612 Acquired absence of left leg above knee: Secondary | ICD-10-CM | POA: Diagnosis not present

## 2023-09-21 DIAGNOSIS — I420 Dilated cardiomyopathy: Secondary | ICD-10-CM | POA: Diagnosis not present

## 2023-09-21 DIAGNOSIS — E1140 Type 2 diabetes mellitus with diabetic neuropathy, unspecified: Secondary | ICD-10-CM | POA: Diagnosis not present

## 2023-09-21 DIAGNOSIS — N183 Chronic kidney disease, stage 3 unspecified: Secondary | ICD-10-CM | POA: Diagnosis not present

## 2023-09-21 NOTE — Telephone Encounter (Signed)
I called and lm on vm to advise pt was in office yesterday and is healing well. Applied dry dressing and shrinker. Pt is to change this daily. I know that dry dressings are not a skilled needs so not sure if they will d/c the pt or if there are other nursing needs that he has. To call with any questions.

## 2023-09-23 ENCOUNTER — Encounter: Payer: Self-pay | Admitting: Orthopedic Surgery

## 2023-09-23 ENCOUNTER — Telehealth: Payer: Self-pay

## 2023-09-23 DIAGNOSIS — Z89612 Acquired absence of left leg above knee: Secondary | ICD-10-CM | POA: Diagnosis not present

## 2023-09-23 DIAGNOSIS — N183 Chronic kidney disease, stage 3 unspecified: Secondary | ICD-10-CM | POA: Diagnosis not present

## 2023-09-23 DIAGNOSIS — I130 Hypertensive heart and chronic kidney disease with heart failure and stage 1 through stage 4 chronic kidney disease, or unspecified chronic kidney disease: Secondary | ICD-10-CM | POA: Diagnosis not present

## 2023-09-23 DIAGNOSIS — I2510 Atherosclerotic heart disease of native coronary artery without angina pectoris: Secondary | ICD-10-CM | POA: Diagnosis not present

## 2023-09-23 DIAGNOSIS — I5022 Chronic systolic (congestive) heart failure: Secondary | ICD-10-CM | POA: Diagnosis not present

## 2023-09-23 DIAGNOSIS — T8131XA Disruption of external operation (surgical) wound, not elsewhere classified, initial encounter: Secondary | ICD-10-CM | POA: Diagnosis not present

## 2023-09-23 DIAGNOSIS — E1122 Type 2 diabetes mellitus with diabetic chronic kidney disease: Secondary | ICD-10-CM | POA: Diagnosis not present

## 2023-09-23 DIAGNOSIS — Z794 Long term (current) use of insulin: Secondary | ICD-10-CM | POA: Diagnosis not present

## 2023-09-23 DIAGNOSIS — E1140 Type 2 diabetes mellitus with diabetic neuropathy, unspecified: Secondary | ICD-10-CM | POA: Diagnosis not present

## 2023-09-23 DIAGNOSIS — I420 Dilated cardiomyopathy: Secondary | ICD-10-CM | POA: Diagnosis not present

## 2023-09-23 NOTE — Telephone Encounter (Signed)
HHN Herbert Seta [id number] [phone] pt has had a dressing on his right foot since 08/28/2023. She removed the dressing and washed the foot will apply flexible band aid to area there is a callus that the had trimmed before looks ok no signs of infection and will also order HHN 1 x q week x 8 weeks to monitor incision on the left and check the right foot. Verbal ok to all and will alos order mepilex boarder dressing to the incision. Will call with any questions.

## 2023-09-23 NOTE — Progress Notes (Signed)
Office Visit Note   Patient: Mike Floyd           Date of Birth: 1960-08-12           MRN: [id number] Visit Date: 09/20/2023              Requested by: Elfredia Nevins, MD [address] [place],  [place] [address] PCP: Elfredia Nevins, MD  Chief Complaint  Patient presents with   Left Leg - Routine Post Op    08/19/23 left AKA      HPI: Patient is a 63 year old gentleman who is 4 weeks status post left above-knee amputation.  Patient is on doxycycline and Dial soap cleansing and dressing changes.  Assessment & Plan: Visit Diagnoses:  1. Hx of AKA (above knee amputation), left (HCC)     Plan: A prescription was provided for shrinker and prosthetic evaluation with [facility].  Remove staples at follow-up in 2 weeks.  Follow-Up Instructions: Return in about 2 weeks (around 10/04/2023).   Ortho Exam  Patient is alert, oriented, no adenopathy, well-dressed, normal affect, normal respiratory effort. Examination the wound edges are well-approximated.  There is no cellulitis or drainage.  Imaging: No results found.   Labs: Lab Results  Component Value Date   HGBA1C 7.7 (H) 06/17/2023   HGBA1C 9.7 (H) 05/13/2022   HGBA1C 7.0 (H) 06/15/2017   ESRSEDRATE 2 03/14/2023   CRP <1 03/14/2023   CRP 0.6 05/16/2022   CRP 2.2 06/27/2019   LABURIC 5.5 03/07/2012   REPTSTATUS 05/18/2022 FINAL 05/13/2022   CULT  05/13/2022    NO GROWTH 5 DAYS Performed at [facility], [address], [place], [place] [address]      Lab Results  Component Value Date   ALBUMIN 4.0 06/17/2023   ALBUMIN 4.4 05/04/2023   ALBUMIN 4.1 02/26/2023   PREALBUMIN 23 06/17/2023    Lab Results  Component Value Date   MG 1.8 02/26/2023   MG 1.7 05/20/2022   MG 2.0 05/19/2022   Lab Results  Component Value Date   VD25OH 18 (L) 06/15/2017    Lab Results  Component Value Date   PREALBUMIN 23 06/17/2023      Latest Ref Rng & Units 08/19/2023    8:08 AM 07/22/2023    9:33 AM 06/17/2023     8:40 AM  CBC EXTENDED  WBC 4.0 - 10.5 K/uL 6.4  8.4  5.3   RBC 4.22 - 5.81 MIL/uL 5.37  4.88  4.71   Hemoglobin 13.0 - 17.0 g/dL 82.9  56.2  13.0   HCT 39.0 - 52.0 % 45.3  40.8  39.7   Platelets 150 - 400 K/uL 138  164  148   NEUT# 1.7 - 7.7 K/uL   3.6   Lymph# 0.7 - 4.0 K/uL   1.1      There is no height or weight on file to calculate BMI.  Orders:  No orders of the defined types were placed in this encounter.  No orders of the defined types were placed in this encounter.    Procedures: No procedures performed  Clinical Data: No additional findings.  ROS:  All other systems negative, except as noted in the HPI. Review of Systems  Objective: Vital Signs: There were no vitals taken for this visit.  Specialty Comments:  No specialty comments available.  PMFS History: Patient Active Problem List   Diagnosis Date Noted   S/P AKA (above knee amputation) unilateral, left (HCC) 08/19/2023   Dehiscence of amputation  stump of left lower extremity (HCC) 07/22/2023   Ischemia of site of left below knee amputation (HCC) 07/22/2023   S/P BKA (below knee amputation) unilateral, left (HCC) 07/22/2023   Below-knee amputation of left lower extremity (HCC) 06/17/2023   Acute osteomyelitis of metatarsal bone of left foot (HCC)    Subacute osteomyelitis, left ankle and foot (HCC) 05/13/2022   Non-pressure chronic ulcer of other part of right foot limited to breakdown of skin (HCC)    Contracture of right Achilles tendon    Mildly dilatd aortic root (HCC)    Contracture of left Achilles tendon    Metatarsal deformity, left    CAD (coronary artery disease)    Hallux hammertoe, left 12/11/2019   Diarrhea 06/27/2019   Diastasis recti 08/22/2018   Umbilical hernia without obstruction and without gangrene 08/22/2018   Vitamin D deficiency 06/23/2017   Essential hypertension, benign 06/15/2017   Mixed hyperlipidemia 06/15/2017   Class 1 obesity due to excess calories with serious  comorbidity and body mass index (BMI) of 31.0 to 31.9 in adult 06/15/2017   Malfunction of implantable cardioverter-defibrillator (ICD) electrode 10/07/2016   Chronic kidney disease (CKD), stage III (moderate) (HCC) 04/04/2015   Bladder neck obstruction 03/21/2015   Difficult or painful urination 03/21/2015   Flank pain 03/21/2015   Delayed onset of urination 03/21/2015   Calculus of kidney 03/21/2015   Erosive esophagitis 12/27/2013   Hypotension due to drugs 07/02/2013   Chronic systolic heart failure (HCC) 04/24/2013   CAP (community acquired pneumonia) 03/16/2013   HTN (hypertension) 03/15/2013   Hydrocele 03/15/2013   Spermatocele 03/15/2013   DOE (dyspnea on exertion) 03/15/2013   OSA (obstructive sleep apnea) 01/09/2013   Chronic kidney disease, stage 3 (HCC) 01/03/2013   Chest pain 12/26/2012   History of diagnostic tests 09/06/2012   TIA (transient ischemic attack)    Cardiomyopathy, nonischemic (HCC) 02/24/2010   AICD (automatic cardioverter/defibrillator) 02/24/2010   DM type 2 causing vascular disease (HCC) 06/27/2009   Gout 06/27/2009   Tobacco abuse, in remission 06/27/2009   COLONIC POLYPS 10/31/2008   GERD (gastroesophageal reflux disease) 10/31/2008   Past Medical History:  Diagnosis Date   AICD (automatic cardioverter/defibrillator) present 11/2007   Removed in 2018, a. 11/2007 [facility] Current VR - single lead ICD  - Removed 2018 - "it was burning me"   Anxiety    CAD (coronary artery disease)    non-obstructive CAD by Cor CT in 2020 // Myoview 3/22: EF 57, small inf-sept defect c/w scar, no ischemia; low risk      Chest pain    a. 10/2007 Cath:  normal Cors.   CKD (chronic kidney disease), stage II    DDD (degenerative disc disease), lumbar    Diabetes mellitus DX: 2010   type 2   Erosive esophagitis    a. per EGD (08/2011), Dr. Karilyn Cota - Erosive reflux esophagitis improved but not completely healed since previous EGD 3 years ago. Bx showing  ulcerated  gatroesophageal junction mucosa. negative for H. pylori   GERD (gastroesophageal reflux disease)    Gout    Hearing deficit    a. wear bilateral hearing aides   History of hiatal hernia    History of kidney stones    passed stones, no surgery   Hypertension    Mildly dilatd aortic root (HCC)    CMR 4/22: EF 52, no LGE; d/w Dr. Lorretta Harp root 38 mm (mildly dilated)   Myocardial infarction [facility]) 2011   Neuropathy  Feet and legs   Nonischemic dilated cardiomyopathy (HCC)    a. H/O EF as low as 35-40% by LV gram 10/2007;  b. Echo 02/2011 EF 50-55%, inf HK, Gr 1 DD // CMR 4/22: EF 52, no LGE; d/w Dr. Lorretta Harp root 38 mm (mildly dilated)    Renal insufficiency    Sleep apnea    pt doesnt use, states"I cant afford one". PCP aware   Stroke (HCC)    mini-stroke in 2014   TIA (transient ischemic attack)    July, 2013   Tobacco abuse, in remission 06/27/2009   Discontinued in 2009     Wears dentures    top plate   WPW (Wolff-Parkinson-White syndrome)    a. s/p RFCA @ [facility] - 1999    Family History  Problem Relation Age of Onset   Heart attack Mother 52   Hypertension Mother    Diabetes Mother    Kidney disease Mother    Breast cancer Mother    Heart attack Father 62   Hypertension Father    Diabetes Father    Stroke Brother    Heart attack Brother 17   Stroke Maternal Grandmother 24   Diabetes Brother    Hypertension Brother     Past Surgical History:  Procedure Laterality Date   AMPUTATION Left 05/19/2022   Procedure: TRANSMETATARSAL AMPUTATION LEFT FOOT;  Surgeon: Nadara Mustard, MD;  Location: [facility] OR;  Service: Orthopedics;  Laterality: Left;   AMPUTATION Left 06/17/2023   Procedure: LEFT BELOW KNEE AMPUTATION;  Surgeon: Nadara Mustard, MD;  Location: [facility] OR;  Service: Orthopedics;  Laterality: Left;   AMPUTATION Left 08/19/2023   Procedure: LEFT ABOVE KNEE AMPUTATION;  Surgeon: Nadara Mustard, MD;  Location: [facility] OR;  Service: Orthopedics;  Laterality: Left;    APPENDECTOMY     BACK SURGERY     1995   CARDIAC CATHETERIZATION     2009   CARDIAC DEFIBRILLATOR PLACEMENT     ICD was removed in 2018   CHOLECYSTECTOMY     CHOLECYSTECTOMY, LAPAROSCOPIC     11/2007   COLONOSCOPY W/ POLYPECTOMY  2009   ELBOW SURGERY Left 06/2010   ESOPHAGEAL DILATION N/A 11/08/2014   Procedure: ESOPHAGEAL DILATION;  Surgeon: Malissa Hippo, MD;  Location: [facility];  Service: Endoscopy;  Laterality: N/A;  #56,    ESOPHAGOGASTRODUODENOSCOPY  03/31/2012   also 08/2011; [name]   ESOPHAGOGASTRODUODENOSCOPY (EGD) WITH PROPOFOL N/A 11/08/2014   Procedure: ESOPHAGOGASTRODUODENOSCOPY (EGD) WITH PROPOFOL;  Surgeon: Malissa Hippo, MD;  Location: [facility];  Service: Endoscopy;  Laterality: N/A;  Hiatus is 55 , GE Junction is 37   FOOT ARTHRODESIS Left 10/24/2020   Procedure: LEFT GASTROCNEMIUS RECESSION, DORSIFLEXION OSTEOTOMY 1ST MT;  Surgeon: Nadara Mustard, MD;  Location: [facility] OR;  Service: Orthopedics;  Laterality: Left;   FOOT ARTHRODESIS Right 01/09/2021   Procedure: CLOSING WEDGE OSTEOTOMY RIGHT 1ST METATARSAL;  Surgeon: Nadara Mustard, MD;  Location: [facility] OR;  Service: Orthopedics;  Laterality: Right;   GASTROCNEMIUS RECESSION Right 01/09/2021   Procedure: RIGHT GASTROCNEMIUS RECESSION;  Surgeon: Nadara Mustard, MD;  Location: [facility] OR;  Service: Orthopedics;  Laterality: Right;   ICD LEAD REMOVAL N/A 10/07/2016   Procedure: ICD LEAD REMOVAL ;  Surgeon: Marinus Maw, MD;  Location: [facility] OR;  Service: Cardiovascular;  Laterality: N/A;   MULTIPLE TOOTH EXTRACTIONS     RADIOFREQUENCY ABLATION  1999   WPW; performed at [facility]   STUMP REVISION Left 07/22/2023  Procedure: REVISION LEFT BELOW KNEE AMPUTATION;  Surgeon: Nadara Mustard, MD;  Location: [facility] OR;  Service: Orthopedics;  Laterality: Left;   TEE WITHOUT CARDIOVERSION N/A 10/07/2016   Procedure: TRANSESOPHAGEAL ECHOCARDIOGRAM (TEE);  Surgeon: Marinus Maw, MD;  Location: [facility] OR;  Service: Cardiovascular;  Laterality: N/A;    Social History   Occupational History   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    Comment: Laid off in 11/11  Tobacco Use   Smoking status: Former    Current packs/day: 0.00    Average packs/day: 1.5 packs/day for 39.1 years (58.6 ttl pk-yrs)    Types: Cigarettes    Start date: 06/30/1971    Quit date: 07/30/2010    Years since quitting: 13.1   Smokeless tobacco: Former    Types: Chew    Quit date: 03/26/1994  Vaping Use   Vaping status: Former   Start date: 07/30/2010   Quit date: 06/29/2016  Substance and Sexual Activity   Alcohol use: No    Alcohol/week: 1.0 standard drink of alcohol    Types: 1 Cans of beer per week    Comment: used "years ago"   Drug use: No   Sexual activity: Yes    Birth control/protection: None

## 2023-09-27 ENCOUNTER — Other Ambulatory Visit: Payer: Self-pay | Admitting: Family

## 2023-09-27 DIAGNOSIS — I5022 Chronic systolic (congestive) heart failure: Secondary | ICD-10-CM | POA: Diagnosis not present

## 2023-09-27 DIAGNOSIS — I420 Dilated cardiomyopathy: Secondary | ICD-10-CM | POA: Diagnosis not present

## 2023-09-27 DIAGNOSIS — E1140 Type 2 diabetes mellitus with diabetic neuropathy, unspecified: Secondary | ICD-10-CM | POA: Diagnosis not present

## 2023-09-27 DIAGNOSIS — I2510 Atherosclerotic heart disease of native coronary artery without angina pectoris: Secondary | ICD-10-CM | POA: Diagnosis not present

## 2023-09-27 DIAGNOSIS — E1122 Type 2 diabetes mellitus with diabetic chronic kidney disease: Secondary | ICD-10-CM | POA: Diagnosis not present

## 2023-09-27 DIAGNOSIS — I130 Hypertensive heart and chronic kidney disease with heart failure and stage 1 through stage 4 chronic kidney disease, or unspecified chronic kidney disease: Secondary | ICD-10-CM | POA: Diagnosis not present

## 2023-09-27 DIAGNOSIS — N183 Chronic kidney disease, stage 3 unspecified: Secondary | ICD-10-CM | POA: Diagnosis not present

## 2023-09-27 DIAGNOSIS — Z794 Long term (current) use of insulin: Secondary | ICD-10-CM | POA: Diagnosis not present

## 2023-09-27 DIAGNOSIS — T8131XA Disruption of external operation (surgical) wound, not elsewhere classified, initial encounter: Secondary | ICD-10-CM | POA: Diagnosis not present

## 2023-09-27 DIAGNOSIS — Z89612 Acquired absence of left leg above knee: Secondary | ICD-10-CM | POA: Diagnosis not present

## 2023-09-28 DIAGNOSIS — E1140 Type 2 diabetes mellitus with diabetic neuropathy, unspecified: Secondary | ICD-10-CM | POA: Diagnosis not present

## 2023-09-28 DIAGNOSIS — T8131XA Disruption of external operation (surgical) wound, not elsewhere classified, initial encounter: Secondary | ICD-10-CM | POA: Diagnosis not present

## 2023-09-28 DIAGNOSIS — Z794 Long term (current) use of insulin: Secondary | ICD-10-CM | POA: Diagnosis not present

## 2023-09-28 DIAGNOSIS — I2510 Atherosclerotic heart disease of native coronary artery without angina pectoris: Secondary | ICD-10-CM | POA: Diagnosis not present

## 2023-09-28 DIAGNOSIS — I420 Dilated cardiomyopathy: Secondary | ICD-10-CM | POA: Diagnosis not present

## 2023-09-28 DIAGNOSIS — I5022 Chronic systolic (congestive) heart failure: Secondary | ICD-10-CM | POA: Diagnosis not present

## 2023-09-28 DIAGNOSIS — I130 Hypertensive heart and chronic kidney disease with heart failure and stage 1 through stage 4 chronic kidney disease, or unspecified chronic kidney disease: Secondary | ICD-10-CM | POA: Diagnosis not present

## 2023-09-28 DIAGNOSIS — Z89612 Acquired absence of left leg above knee: Secondary | ICD-10-CM | POA: Diagnosis not present

## 2023-09-28 DIAGNOSIS — N183 Chronic kidney disease, stage 3 unspecified: Secondary | ICD-10-CM | POA: Diagnosis not present

## 2023-09-28 DIAGNOSIS — E1122 Type 2 diabetes mellitus with diabetic chronic kidney disease: Secondary | ICD-10-CM | POA: Diagnosis not present

## 2023-09-30 DIAGNOSIS — I130 Hypertensive heart and chronic kidney disease with heart failure and stage 1 through stage 4 chronic kidney disease, or unspecified chronic kidney disease: Secondary | ICD-10-CM | POA: Diagnosis not present

## 2023-09-30 DIAGNOSIS — N183 Chronic kidney disease, stage 3 unspecified: Secondary | ICD-10-CM | POA: Diagnosis not present

## 2023-09-30 DIAGNOSIS — I5022 Chronic systolic (congestive) heart failure: Secondary | ICD-10-CM | POA: Diagnosis not present

## 2023-09-30 DIAGNOSIS — Z89612 Acquired absence of left leg above knee: Secondary | ICD-10-CM | POA: Diagnosis not present

## 2023-09-30 DIAGNOSIS — Z794 Long term (current) use of insulin: Secondary | ICD-10-CM | POA: Diagnosis not present

## 2023-09-30 DIAGNOSIS — E1140 Type 2 diabetes mellitus with diabetic neuropathy, unspecified: Secondary | ICD-10-CM | POA: Diagnosis not present

## 2023-09-30 DIAGNOSIS — T8131XA Disruption of external operation (surgical) wound, not elsewhere classified, initial encounter: Secondary | ICD-10-CM | POA: Diagnosis not present

## 2023-09-30 DIAGNOSIS — I2510 Atherosclerotic heart disease of native coronary artery without angina pectoris: Secondary | ICD-10-CM | POA: Diagnosis not present

## 2023-09-30 DIAGNOSIS — E1122 Type 2 diabetes mellitus with diabetic chronic kidney disease: Secondary | ICD-10-CM | POA: Diagnosis not present

## 2023-09-30 DIAGNOSIS — I420 Dilated cardiomyopathy: Secondary | ICD-10-CM | POA: Diagnosis not present

## 2023-10-03 ENCOUNTER — Ambulatory Visit (INDEPENDENT_AMBULATORY_CARE_PROVIDER_SITE_OTHER): Payer: Medicare Other | Admitting: Orthopedic Surgery

## 2023-10-03 DIAGNOSIS — Z89612 Acquired absence of left leg above knee: Secondary | ICD-10-CM

## 2023-10-03 MED ORDER — DOXYCYCLINE HYCLATE 100 MG PO TABS: 100.0000 mg | ORAL_TABLET | Freq: Two times a day (BID) | ORAL | 0 refills | Status: DC

## 2023-10-04 ENCOUNTER — Encounter: Payer: Self-pay | Admitting: Orthopedic Surgery

## 2023-10-04 DIAGNOSIS — I130 Hypertensive heart and chronic kidney disease with heart failure and stage 1 through stage 4 chronic kidney disease, or unspecified chronic kidney disease: Secondary | ICD-10-CM | POA: Diagnosis not present

## 2023-10-04 DIAGNOSIS — I2510 Atherosclerotic heart disease of native coronary artery without angina pectoris: Secondary | ICD-10-CM | POA: Diagnosis not present

## 2023-10-04 DIAGNOSIS — E1122 Type 2 diabetes mellitus with diabetic chronic kidney disease: Secondary | ICD-10-CM | POA: Diagnosis not present

## 2023-10-04 DIAGNOSIS — Z89612 Acquired absence of left leg above knee: Secondary | ICD-10-CM | POA: Diagnosis not present

## 2023-10-04 DIAGNOSIS — T8131XA Disruption of external operation (surgical) wound, not elsewhere classified, initial encounter: Secondary | ICD-10-CM | POA: Diagnosis not present

## 2023-10-04 DIAGNOSIS — Z794 Long term (current) use of insulin: Secondary | ICD-10-CM | POA: Diagnosis not present

## 2023-10-04 DIAGNOSIS — E1140 Type 2 diabetes mellitus with diabetic neuropathy, unspecified: Secondary | ICD-10-CM | POA: Diagnosis not present

## 2023-10-04 DIAGNOSIS — I420 Dilated cardiomyopathy: Secondary | ICD-10-CM | POA: Diagnosis not present

## 2023-10-04 DIAGNOSIS — N183 Chronic kidney disease, stage 3 unspecified: Secondary | ICD-10-CM | POA: Diagnosis not present

## 2023-10-04 DIAGNOSIS — I5022 Chronic systolic (congestive) heart failure: Secondary | ICD-10-CM | POA: Diagnosis not present

## 2023-10-04 NOTE — Progress Notes (Signed)
 Office Visit Note   Patient: Mike Floyd           Date of Birth: 08/30/1960           MRN: [id number] Visit Date: 10/03/2023              Requested by: Elfredia Nevins, MD [address] [place],  [place] [address] PCP: Elfredia Nevins, MD  Chief Complaint  Patient presents with   Left Leg - Routine Post Op    08/19/23 left AKA      HPI: Patient is a 63 year old gentleman who is 6 weeks status post left above-knee amputation.  Patient states he has had clear serous drainage.  Assessment & Plan: Visit Diagnoses:  1. Hx of AKA (above knee amputation), left (HCC)     Plan: Staples harvested today.  Reevaluate in 2 weeks.  Prescription for doxycycline.  Follow-Up Instructions: Return in about 2 weeks (around 10/17/2023).   Ortho Exam  Patient is alert, oriented, no adenopathy, well-dressed, normal affect, normal respiratory effort. Examination the incision is well-approximated there is some clear drainage but no cellulitis.  Examination the right foot patient has callus beneath the right third and fourth metatarsal heads and medial aspect of his foot.  There is no cellulitis or drainage.  Imaging: No results found. No images are attached to the encounter.  Labs: Lab Results  Component Value Date   HGBA1C 7.7 (H) 06/17/2023   HGBA1C 9.7 (H) 05/13/2022   HGBA1C 7.0 (H) 06/15/2017   ESRSEDRATE 2 03/14/2023   CRP <1 03/14/2023   CRP 0.6 05/16/2022   CRP 2.2 06/27/2019   LABURIC 5.5 03/07/2012   REPTSTATUS 05/18/2022 FINAL 05/13/2022   CULT  05/13/2022    NO GROWTH 5 DAYS Performed at [facility], [address], [place], [place] [address]      Lab Results  Component Value Date   ALBUMIN 4.0 06/17/2023   ALBUMIN 4.4 05/04/2023   ALBUMIN 4.1 02/26/2023   PREALBUMIN 23 06/17/2023    Lab Results  Component Value Date   MG 1.8 02/26/2023   MG 1.7 05/20/2022   MG 2.0 05/19/2022   Lab Results  Component Value Date   VD25OH 18 (L) 06/15/2017    Lab  Results  Component Value Date   PREALBUMIN 23 06/17/2023      Latest Ref Rng & Units 08/19/2023    8:08 AM 07/22/2023    9:33 AM 06/17/2023    8:40 AM  CBC EXTENDED  WBC 4.0 - 10.5 K/uL 6.4  8.4  5.3   RBC 4.22 - 5.81 MIL/uL 5.37  4.88  4.71   Hemoglobin 13.0 - 17.0 g/dL 82.9  56.2  13.0   HCT 39.0 - 52.0 % 45.3  40.8  39.7   Platelets 150 - 400 K/uL 138  164  148   NEUT# 1.7 - 7.7 K/uL   3.6   Lymph# 0.7 - 4.0 K/uL   1.1      There is no height or weight on file to calculate BMI.  Orders:  No orders of the defined types were placed in this encounter.  Meds ordered this encounter  Medications   doxycycline (VIBRA-TABS) 100 MG tablet    Sig: Take 1 tablet (100 mg total) by mouth 2 (two) times daily.    Dispense:  30 tablet    Refill:  0     Procedures: No procedures performed  Clinical Data: No additional findings.  ROS:  All other systems negative,  except as noted in the HPI. Review of Systems  Objective: Vital Signs: There were no vitals taken for this visit.  Specialty Comments:  No specialty comments available.  PMFS History: Patient Active Problem List   Diagnosis Date Noted   S/P AKA (above knee amputation) unilateral, left (HCC) 08/19/2023   Dehiscence of amputation stump of left lower extremity (HCC) 07/22/2023   Ischemia of site of left below knee amputation (HCC) 07/22/2023   S/P BKA (below knee amputation) unilateral, left (HCC) 07/22/2023   Below-knee amputation of left lower extremity (HCC) 06/17/2023   Acute osteomyelitis of metatarsal bone of left foot (HCC)    Subacute osteomyelitis, left ankle and foot (HCC) 05/13/2022   Non-pressure chronic ulcer of other part of right foot limited to breakdown of skin (HCC)    Contracture of right Achilles tendon    Mildly dilatd aortic root (HCC)    Contracture of left Achilles tendon    Metatarsal deformity, left    CAD (coronary artery disease)    Hallux hammertoe, left 12/11/2019   Diarrhea  06/27/2019   Diastasis recti 08/22/2018   Umbilical hernia without obstruction and without gangrene 08/22/2018   Vitamin D deficiency 06/23/2017   Essential hypertension, benign 06/15/2017   Mixed hyperlipidemia 06/15/2017   Class 1 obesity due to excess calories with serious comorbidity and body mass index (BMI) of 31.0 to 31.9 in adult 06/15/2017   Malfunction of implantable cardioverter-defibrillator (ICD) electrode 10/07/2016   Chronic kidney disease (CKD), stage III (moderate) (HCC) 04/04/2015   Bladder neck obstruction 03/21/2015   Difficult or painful urination 03/21/2015   Flank pain 03/21/2015   Delayed onset of urination 03/21/2015   Calculus of kidney 03/21/2015   Erosive esophagitis 12/27/2013   Hypotension due to drugs 07/02/2013   Chronic systolic heart failure (HCC) 04/24/2013   CAP (community acquired pneumonia) 03/16/2013   HTN (hypertension) 03/15/2013   Hydrocele 03/15/2013   Spermatocele 03/15/2013   DOE (dyspnea on exertion) 03/15/2013   OSA (obstructive sleep apnea) 01/09/2013   Chronic kidney disease, stage 3 (HCC) 01/03/2013   Chest pain 12/26/2012   History of diagnostic tests 09/06/2012   TIA (transient ischemic attack)    Cardiomyopathy, nonischemic (HCC) 02/24/2010   AICD (automatic cardioverter/defibrillator) 02/24/2010   DM type 2 causing vascular disease (HCC) 06/27/2009   Gout 06/27/2009   Tobacco abuse, in remission 06/27/2009   COLONIC POLYPS 10/31/2008   GERD (gastroesophageal reflux disease) 10/31/2008   Past Medical History:  Diagnosis Date   AICD (automatic cardioverter/defibrillator) present 11/2007   Removed in 2018, a. 11/2007 SJM Current VR - single lead ICD  - Removed 2018 - "it was burning me"   Anxiety    CAD (coronary artery disease)    non-obstructive CAD by Cor CT in 2020 // Myoview 3/22: EF 57, small inf-sept defect c/w scar, no ischemia; low risk      Chest pain    a. 10/2007 Cath:  normal Cors.   CKD (chronic kidney  disease), stage II    DDD (degenerative disc disease), lumbar    Diabetes mellitus DX: 2010   type 2   Erosive esophagitis    a. per EGD (08/2011), Dr. Karilyn Cota - Erosive reflux esophagitis improved but not completely healed since previous EGD 3 years ago. Bx showing  ulcerated gatroesophageal junction mucosa. negative for H. pylori   GERD (gastroesophageal reflux disease)    Gout    Hearing deficit    a. wear bilateral hearing aides   History  of hiatal hernia    History of kidney stones    passed stones, no surgery   Hypertension    Mildly dilatd aortic root (HCC)    CMR 4/22: EF 52, no LGE; d/w Dr. Lorretta Harp root 38 mm (mildly dilated)   Myocardial infarction [facility]) 2011   Neuropathy    Feet and legs   Nonischemic dilated cardiomyopathy (HCC)    a. H/O EF as low as 35-40% by LV gram 10/2007;  b. Echo 02/2011 EF 50-55%, inf HK, Gr 1 DD // CMR 4/22: EF 52, no LGE; d/w Dr. Lorretta Harp root 38 mm (mildly dilated)    Renal insufficiency    Sleep apnea    pt doesnt use, states"I cant afford one". PCP aware   Stroke (HCC)    mini-stroke in 2014   TIA (transient ischemic attack)    July, 2013   Tobacco abuse, in remission 06/27/2009   Discontinued in 2009     Wears dentures    top plate   WPW (Wolff-Parkinson-White syndrome)    a. s/p RFCA @ [facility] - 1999    Family History  Problem Relation Age of Onset   Heart attack Mother 62   Hypertension Mother    Diabetes Mother    Kidney disease Mother    Breast cancer Mother    Heart attack Father 4   Hypertension Father    Diabetes Father    Stroke Brother    Heart attack Brother 60   Stroke Maternal Grandmother 44   Diabetes Brother    Hypertension Brother     Past Surgical History:  Procedure Laterality Date   AMPUTATION Left 05/19/2022   Procedure: TRANSMETATARSAL AMPUTATION LEFT FOOT;  Surgeon: Nadara Mustard, MD;  Location[facility];  Service: Orthopedics;  Laterality: Left;   AMPUTATION Left 06/17/2023   Procedure: LEFT BELOW  KNEE AMPUTATION;  Surgeon: Nadara Mustard, MD;  Location: [facility] OR;  Service: Orthopedics;  Laterality: Left;   AMPUTATION Left 08/19/2023   Procedure: LEFT ABOVE KNEE AMPUTATION;  Surgeon: Nadara Mustard, MD;  Location: [facility] OR;  Service: Orthopedics;  Laterality: Left;   APPENDECTOMY     BACK SURGERY     1995   CARDIAC CATHETERIZATION     2009   CARDIAC DEFIBRILLATOR PLACEMENT     ICD was removed in 2018   CHOLECYSTECTOMY     CHOLECYSTECTOMY, LAPAROSCOPIC     11/2007   COLONOSCOPY W/ POLYPECTOMY  2009   ELBOW SURGERY Left 06/2010   ESOPHAGEAL DILATION N/A 11/08/2014   Procedure: ESOPHAGEAL DILATION;  Surgeon: Malissa Hippo, MD;  Location: [facility];  Service: Endoscopy;  Laterality: N/A;  #56,    ESOPHAGOGASTRODUODENOSCOPY  03/31/2012   also 08/2011; [name]   ESOPHAGOGASTRODUODENOSCOPY (EGD) WITH PROPOFOL N/A 11/08/2014   Procedure: ESOPHAGOGASTRODUODENOSCOPY (EGD) WITH PROPOFOL;  Surgeon: Malissa Hippo, MD;  Location: [facility];  Service: Endoscopy;  Laterality: N/A;  Hiatus is 68 , GE Junction is 37   FOOT ARTHRODESIS Left 10/24/2020   Procedure: LEFT GASTROCNEMIUS RECESSION, DORSIFLEXION OSTEOTOMY 1ST MT;  Surgeon: Nadara Mustard, MD;  Location: [facility] OR;  Service: Orthopedics;  Laterality: Left;   FOOT ARTHRODESIS Right 01/09/2021   Procedure: CLOSING WEDGE OSTEOTOMY RIGHT 1ST METATARSAL;  Surgeon: Nadara Mustard, MD;  Location: [facility] OR;  Service: Orthopedics;  Laterality: Right;   GASTROCNEMIUS RECESSION Right 01/09/2021   Procedure: RIGHT GASTROCNEMIUS RECESSION;  Surgeon: Nadara Mustard, MD;  Location[facility] OR;  Service: Orthopedics;  Laterality: Right;  ICD LEAD REMOVAL N/A 10/07/2016   Procedure: ICD LEAD REMOVAL ;  Surgeon: Marinus Maw, MD;  Location: [facility] OR;  Service: Cardiovascular;  Laterality: N/A;   MULTIPLE TOOTH EXTRACTIONS     RADIOFREQUENCY ABLATION  1999   WPW; [facility]   STUMP REVISION Left 07/22/2023   Procedure: REVISION LEFT BELOW KNEE AMPUTATION;   Surgeon: Nadara Mustard, MD;  Location: [facility] OR;  Service: Orthopedics;  Laterality: Left;   TEE WITHOUT CARDIOVERSION N/A 10/07/2016   Procedure: TRANSESOPHAGEAL ECHOCARDIOGRAM (TEE);  Surgeon: Marinus Maw, MD;  Location: [facility] OR;  Service: Cardiovascular;  Laterality: N/A;   Social History   Occupational History   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    Comment: Laid off in 11/11  Tobacco Use   Smoking status: Former    Current packs/day: 0.00    Average packs/day: 1.5 packs/day for 39.1 years (58.6 ttl pk-yrs)    Types: Cigarettes    Start date: 06/30/1971    Quit date: 07/30/2010    Years since quitting: 13.1   Smokeless tobacco: Former    Types: Chew    Quit date: 03/26/1994  Vaping Use   Vaping status: Former   Start date: 07/30/2010   Quit date: 06/29/2016  Substance and Sexual Activity   Alcohol use: No    Alcohol/week: 1.0 standard drink of alcohol    Types: 1 Cans of beer per week    Comment: used "years ago"   Drug use: No   Sexual activity: Yes    Birth control/protection: None

## 2023-10-05 DIAGNOSIS — Z89612 Acquired absence of left leg above knee: Secondary | ICD-10-CM | POA: Diagnosis not present

## 2023-10-05 DIAGNOSIS — I5022 Chronic systolic (congestive) heart failure: Secondary | ICD-10-CM | POA: Diagnosis not present

## 2023-10-05 DIAGNOSIS — E1122 Type 2 diabetes mellitus with diabetic chronic kidney disease: Secondary | ICD-10-CM | POA: Diagnosis not present

## 2023-10-05 DIAGNOSIS — I420 Dilated cardiomyopathy: Secondary | ICD-10-CM | POA: Diagnosis not present

## 2023-10-05 DIAGNOSIS — I130 Hypertensive heart and chronic kidney disease with heart failure and stage 1 through stage 4 chronic kidney disease, or unspecified chronic kidney disease: Secondary | ICD-10-CM | POA: Diagnosis not present

## 2023-10-05 DIAGNOSIS — N183 Chronic kidney disease, stage 3 unspecified: Secondary | ICD-10-CM | POA: Diagnosis not present

## 2023-10-05 DIAGNOSIS — E1140 Type 2 diabetes mellitus with diabetic neuropathy, unspecified: Secondary | ICD-10-CM | POA: Diagnosis not present

## 2023-10-05 DIAGNOSIS — Z794 Long term (current) use of insulin: Secondary | ICD-10-CM | POA: Diagnosis not present

## 2023-10-05 DIAGNOSIS — T8131XA Disruption of external operation (surgical) wound, not elsewhere classified, initial encounter: Secondary | ICD-10-CM | POA: Diagnosis not present

## 2023-10-05 DIAGNOSIS — I2510 Atherosclerotic heart disease of native coronary artery without angina pectoris: Secondary | ICD-10-CM | POA: Diagnosis not present

## 2023-10-10 DIAGNOSIS — E1122 Type 2 diabetes mellitus with diabetic chronic kidney disease: Secondary | ICD-10-CM | POA: Diagnosis not present

## 2023-10-10 DIAGNOSIS — I5022 Chronic systolic (congestive) heart failure: Secondary | ICD-10-CM | POA: Diagnosis not present

## 2023-10-10 DIAGNOSIS — I130 Hypertensive heart and chronic kidney disease with heart failure and stage 1 through stage 4 chronic kidney disease, or unspecified chronic kidney disease: Secondary | ICD-10-CM | POA: Diagnosis not present

## 2023-10-10 DIAGNOSIS — I2510 Atherosclerotic heart disease of native coronary artery without angina pectoris: Secondary | ICD-10-CM | POA: Diagnosis not present

## 2023-10-10 DIAGNOSIS — N183 Chronic kidney disease, stage 3 unspecified: Secondary | ICD-10-CM | POA: Diagnosis not present

## 2023-10-10 DIAGNOSIS — I420 Dilated cardiomyopathy: Secondary | ICD-10-CM | POA: Diagnosis not present

## 2023-10-10 DIAGNOSIS — Z89612 Acquired absence of left leg above knee: Secondary | ICD-10-CM | POA: Diagnosis not present

## 2023-10-10 DIAGNOSIS — T8131XA Disruption of external operation (surgical) wound, not elsewhere classified, initial encounter: Secondary | ICD-10-CM | POA: Diagnosis not present

## 2023-10-10 DIAGNOSIS — E1140 Type 2 diabetes mellitus with diabetic neuropathy, unspecified: Secondary | ICD-10-CM | POA: Diagnosis not present

## 2023-10-10 DIAGNOSIS — Z794 Long term (current) use of insulin: Secondary | ICD-10-CM | POA: Diagnosis not present

## 2023-10-11 DIAGNOSIS — N183 Chronic kidney disease, stage 3 unspecified: Secondary | ICD-10-CM | POA: Diagnosis not present

## 2023-10-11 DIAGNOSIS — I5022 Chronic systolic (congestive) heart failure: Secondary | ICD-10-CM | POA: Diagnosis not present

## 2023-10-11 DIAGNOSIS — I420 Dilated cardiomyopathy: Secondary | ICD-10-CM | POA: Diagnosis not present

## 2023-10-11 DIAGNOSIS — E1122 Type 2 diabetes mellitus with diabetic chronic kidney disease: Secondary | ICD-10-CM | POA: Diagnosis not present

## 2023-10-11 DIAGNOSIS — Z794 Long term (current) use of insulin: Secondary | ICD-10-CM | POA: Diagnosis not present

## 2023-10-11 DIAGNOSIS — Z89612 Acquired absence of left leg above knee: Secondary | ICD-10-CM | POA: Diagnosis not present

## 2023-10-11 DIAGNOSIS — I130 Hypertensive heart and chronic kidney disease with heart failure and stage 1 through stage 4 chronic kidney disease, or unspecified chronic kidney disease: Secondary | ICD-10-CM | POA: Diagnosis not present

## 2023-10-11 DIAGNOSIS — I2510 Atherosclerotic heart disease of native coronary artery without angina pectoris: Secondary | ICD-10-CM | POA: Diagnosis not present

## 2023-10-11 DIAGNOSIS — T8131XA Disruption of external operation (surgical) wound, not elsewhere classified, initial encounter: Secondary | ICD-10-CM | POA: Diagnosis not present

## 2023-10-11 DIAGNOSIS — E1140 Type 2 diabetes mellitus with diabetic neuropathy, unspecified: Secondary | ICD-10-CM | POA: Diagnosis not present

## 2023-10-17 ENCOUNTER — Telehealth: Payer: Self-pay | Admitting: Orthopedic Surgery

## 2023-10-17 ENCOUNTER — Encounter: Payer: Medicare Other | Admitting: Orthopedic Surgery

## 2023-10-17 DIAGNOSIS — I2510 Atherosclerotic heart disease of native coronary artery without angina pectoris: Secondary | ICD-10-CM | POA: Diagnosis not present

## 2023-10-17 DIAGNOSIS — T8131XA Disruption of external operation (surgical) wound, not elsewhere classified, initial encounter: Secondary | ICD-10-CM | POA: Diagnosis not present

## 2023-10-17 DIAGNOSIS — E1122 Type 2 diabetes mellitus with diabetic chronic kidney disease: Secondary | ICD-10-CM | POA: Diagnosis not present

## 2023-10-17 DIAGNOSIS — N183 Chronic kidney disease, stage 3 unspecified: Secondary | ICD-10-CM | POA: Diagnosis not present

## 2023-10-17 DIAGNOSIS — I420 Dilated cardiomyopathy: Secondary | ICD-10-CM | POA: Diagnosis not present

## 2023-10-17 DIAGNOSIS — E1140 Type 2 diabetes mellitus with diabetic neuropathy, unspecified: Secondary | ICD-10-CM | POA: Diagnosis not present

## 2023-10-17 DIAGNOSIS — Z89612 Acquired absence of left leg above knee: Secondary | ICD-10-CM | POA: Diagnosis not present

## 2023-10-17 DIAGNOSIS — I130 Hypertensive heart and chronic kidney disease with heart failure and stage 1 through stage 4 chronic kidney disease, or unspecified chronic kidney disease: Secondary | ICD-10-CM | POA: Diagnosis not present

## 2023-10-17 DIAGNOSIS — Z794 Long term (current) use of insulin: Secondary | ICD-10-CM | POA: Diagnosis not present

## 2023-10-17 DIAGNOSIS — I5022 Chronic systolic (congestive) heart failure: Secondary | ICD-10-CM | POA: Diagnosis not present

## 2023-10-17 NOTE — Telephone Encounter (Signed)
 Corrie Dandy called she is the PT working in home with patient. Says he fell on the surgical leg. Has bleeding. Not active bleeding bandage since Friday. Her cb# [phone]

## 2023-10-17 NOTE — Telephone Encounter (Signed)
 I called and lm on vm to question if this is FYI or if pt needs to be seen this week? Has an appt on 10/25/2023 but if he needs to be seen sooner can move this up.

## 2023-10-17 NOTE — Telephone Encounter (Signed)
 Mike Floyd is returning a call, best call back is [phone] (call after 9:30am)

## 2023-10-18 DIAGNOSIS — M86172 Other acute osteomyelitis, left ankle and foot: Secondary | ICD-10-CM | POA: Diagnosis not present

## 2023-10-18 NOTE — Telephone Encounter (Signed)
 Called and lm on vm to advise to call office with details so that I can call back and lm if I miss her with answers. If the pt needs appt moved up we can do that this week. If she has questions please leave a message with whomever answers the phone and I will return her call with answers.

## 2023-10-18 NOTE — Telephone Encounter (Signed)
 duplicate

## 2023-10-19 DIAGNOSIS — I2510 Atherosclerotic heart disease of native coronary artery without angina pectoris: Secondary | ICD-10-CM | POA: Diagnosis not present

## 2023-10-19 DIAGNOSIS — N183 Chronic kidney disease, stage 3 unspecified: Secondary | ICD-10-CM | POA: Diagnosis not present

## 2023-10-19 DIAGNOSIS — Z794 Long term (current) use of insulin: Secondary | ICD-10-CM | POA: Diagnosis not present

## 2023-10-19 DIAGNOSIS — E1122 Type 2 diabetes mellitus with diabetic chronic kidney disease: Secondary | ICD-10-CM | POA: Diagnosis not present

## 2023-10-19 DIAGNOSIS — T8131XA Disruption of external operation (surgical) wound, not elsewhere classified, initial encounter: Secondary | ICD-10-CM | POA: Diagnosis not present

## 2023-10-19 DIAGNOSIS — I130 Hypertensive heart and chronic kidney disease with heart failure and stage 1 through stage 4 chronic kidney disease, or unspecified chronic kidney disease: Secondary | ICD-10-CM | POA: Diagnosis not present

## 2023-10-19 DIAGNOSIS — I5022 Chronic systolic (congestive) heart failure: Secondary | ICD-10-CM | POA: Diagnosis not present

## 2023-10-19 DIAGNOSIS — I420 Dilated cardiomyopathy: Secondary | ICD-10-CM | POA: Diagnosis not present

## 2023-10-19 DIAGNOSIS — E1140 Type 2 diabetes mellitus with diabetic neuropathy, unspecified: Secondary | ICD-10-CM | POA: Diagnosis not present

## 2023-10-19 DIAGNOSIS — Z89612 Acquired absence of left leg above knee: Secondary | ICD-10-CM | POA: Diagnosis not present

## 2023-10-21 ENCOUNTER — Telehealth: Payer: Self-pay | Admitting: Orthopedic Surgery

## 2023-10-21 NOTE — Telephone Encounter (Signed)
 Mike Floyd (PT) [facility] called  said been playing phone tag with Autumn F.. Mary called with updates. Incision is looking better and nurse visit went well. Mary secure number is [phone].

## 2023-10-24 NOTE — Telephone Encounter (Signed)
 Noted pt has an appt tomorrow. Have asked with every VM left to let me know if she has any questions if the pt was needing to be seen sooner detailed message request so that I could call back with answers this was documented in every phone message as well.

## 2023-10-25 ENCOUNTER — Ambulatory Visit (INDEPENDENT_AMBULATORY_CARE_PROVIDER_SITE_OTHER): Admitting: Orthopedic Surgery

## 2023-10-25 DIAGNOSIS — Z89612 Acquired absence of left leg above knee: Secondary | ICD-10-CM

## 2023-10-28 DIAGNOSIS — I420 Dilated cardiomyopathy: Secondary | ICD-10-CM | POA: Diagnosis not present

## 2023-10-28 DIAGNOSIS — E1140 Type 2 diabetes mellitus with diabetic neuropathy, unspecified: Secondary | ICD-10-CM | POA: Diagnosis not present

## 2023-10-28 DIAGNOSIS — T8131XA Disruption of external operation (surgical) wound, not elsewhere classified, initial encounter: Secondary | ICD-10-CM | POA: Diagnosis not present

## 2023-10-28 DIAGNOSIS — E1122 Type 2 diabetes mellitus with diabetic chronic kidney disease: Secondary | ICD-10-CM | POA: Diagnosis not present

## 2023-10-28 DIAGNOSIS — N183 Chronic kidney disease, stage 3 unspecified: Secondary | ICD-10-CM | POA: Diagnosis not present

## 2023-10-28 DIAGNOSIS — I130 Hypertensive heart and chronic kidney disease with heart failure and stage 1 through stage 4 chronic kidney disease, or unspecified chronic kidney disease: Secondary | ICD-10-CM | POA: Diagnosis not present

## 2023-10-28 DIAGNOSIS — Z794 Long term (current) use of insulin: Secondary | ICD-10-CM | POA: Diagnosis not present

## 2023-10-28 DIAGNOSIS — I5022 Chronic systolic (congestive) heart failure: Secondary | ICD-10-CM | POA: Diagnosis not present

## 2023-10-28 DIAGNOSIS — I2510 Atherosclerotic heart disease of native coronary artery without angina pectoris: Secondary | ICD-10-CM | POA: Diagnosis not present

## 2023-10-28 DIAGNOSIS — Z89612 Acquired absence of left leg above knee: Secondary | ICD-10-CM | POA: Diagnosis not present

## 2023-10-30 ENCOUNTER — Encounter: Payer: Self-pay | Admitting: Orthopedic Surgery

## 2023-10-30 NOTE — Progress Notes (Signed)
 Office Visit Note   Patient: Mike Floyd           Date of Birth: May 13, 1961           MRN: [id number] Visit Date: 10/25/2023              Requested by: Elfredia Nevins, MD [address] [place],  [place] [address] PCP: Elfredia Nevins, MD  Chief Complaint  Patient presents with   Left Leg - Routine Post Op    08/19/23 left AKA      HPI: Patient is 9 weeks status post left above-knee amputation.  Patient has completed his doxycycline.  Patient states he has constant pain he states his pain is a 5 out of 10.  He states it is usually a 10 out of 10.  He has had no traumatic falls.  Patient also complains of cramping.  He is on Lasix without potassium replacement.  Assessment & Plan: Visit Diagnoses:  1. Hx of AKA (above knee amputation), left (HCC)     Plan: Continue with compression.  Recommended coconut water to help with electrolyte replacement from his Lasix.  Follow-Up Instructions: Return in about 4 weeks (around 11/22/2023).   Ortho Exam  Patient is alert, oriented, no adenopathy, well-dressed, normal affect, normal respiratory effort. Examination of the wound is healing well there is no cellulitis.  Imaging: No results found. No images are attached to the encounter.  Labs: Lab Results  Component Value Date   HGBA1C 7.7 (H) 06/17/2023   HGBA1C 9.7 (H) 05/13/2022   HGBA1C 7.0 (H) 06/15/2017   ESRSEDRATE 2 03/14/2023   CRP <1 03/14/2023   CRP 0.6 05/16/2022   CRP 2.2 06/27/2019   LABURIC 5.5 03/07/2012   REPTSTATUS 05/18/2022 FINAL 05/13/2022   CULT  05/13/2022    NO GROWTH 5 DAYS Performed at [facility], [address] [place], [place] [address]      Lab Results  Component Value Date   ALBUMIN 4.0 06/17/2023   ALBUMIN 4.4 05/04/2023   ALBUMIN 4.1 02/26/2023   PREALBUMIN 23 06/17/2023    Lab Results  Component Value Date   MG 1.8 02/26/2023   MG 1.7 05/20/2022   MG 2.0 05/19/2022   Lab Results  Component Value Date   VD25OH 18 (L)  06/15/2017    Lab Results  Component Value Date   PREALBUMIN 23 06/17/2023      Latest Ref Rng & Units 08/19/2023    8:08 AM 07/22/2023    9:33 AM 06/17/2023    8:40 AM  CBC EXTENDED  WBC 4.0 - 10.5 K/uL 6.4  8.4  5.3   RBC 4.22 - 5.81 MIL/uL 5.37  4.88  4.71   Hemoglobin 13.0 - 17.0 g/dL 13.0  86.5  78.4   HCT 39.0 - 52.0 % 45.3  40.8  39.7   Platelets 150 - 400 K/uL 138  164  148   NEUT# 1.7 - 7.7 K/uL   3.6   Lymph# 0.7 - 4.0 K/uL   1.1      There is no height or weight on file to calculate BMI.  Orders:  No orders of the defined types were placed in this encounter.  No orders of the defined types were placed in this encounter.    Procedures: No procedures performed  Clinical Data: No additional findings.  ROS:  All other systems negative, except as noted in the HPI. Review of Systems  Objective: Vital Signs: There were no vitals taken for  this visit.  Specialty Comments:  No specialty comments available.  PMFS History: Patient Active Problem List   Diagnosis Date Noted   S/P AKA (above knee amputation) unilateral, left (HCC) 08/19/2023   Dehiscence of amputation stump of left lower extremity (HCC) 07/22/2023   Ischemia of site of left below knee amputation (HCC) 07/22/2023   S/P BKA (below knee amputation) unilateral, left (HCC) 07/22/2023   Below-knee amputation of left lower extremity (HCC) 06/17/2023   Acute osteomyelitis of metatarsal bone of left foot (HCC)    Subacute osteomyelitis, left ankle and foot (HCC) 05/13/2022   Non-pressure chronic ulcer of other part of right foot limited to breakdown of skin (HCC)    Contracture of right Achilles tendon    Mildly dilatd aortic root (HCC)    Contracture of left Achilles tendon    Metatarsal deformity, left    CAD (coronary artery disease)    Hallux hammertoe, left 12/11/2019   Diarrhea 06/27/2019   Diastasis recti 08/22/2018   Umbilical hernia without obstruction and without gangrene 08/22/2018    Vitamin D deficiency 06/23/2017   Essential hypertension, benign 06/15/2017   Mixed hyperlipidemia 06/15/2017   Class 1 obesity due to excess calories with serious comorbidity and body mass index (BMI) of 31.0 to 31.9 in adult 06/15/2017   Malfunction of implantable cardioverter-defibrillator (ICD) electrode 10/07/2016   Chronic kidney disease (CKD), stage III (moderate) (HCC) 04/04/2015   Bladder neck obstruction 03/21/2015   Difficult or painful urination 03/21/2015   Flank pain 03/21/2015   Delayed onset of urination 03/21/2015   Calculus of kidney 03/21/2015   Erosive esophagitis 12/27/2013   Hypotension due to drugs 07/02/2013   Chronic systolic heart failure (HCC) 04/24/2013   CAP (community acquired pneumonia) 03/16/2013   HTN (hypertension) 03/15/2013   Hydrocele 03/15/2013   Spermatocele 03/15/2013   DOE (dyspnea on exertion) 03/15/2013   OSA (obstructive sleep apnea) 01/09/2013   Chronic kidney disease, stage 3 (HCC) 01/03/2013   Chest pain 12/26/2012   History of diagnostic tests 09/06/2012   TIA (transient ischemic attack)    Cardiomyopathy, nonischemic (HCC) 02/24/2010   AICD (automatic cardioverter/defibrillator) 02/24/2010   DM type 2 causing vascular disease (HCC) 06/27/2009   Gout 06/27/2009   Tobacco abuse, in remission 06/27/2009   COLONIC POLYPS 10/31/2008   GERD (gastroesophageal reflux disease) 10/31/2008   Past Medical History:  Diagnosis Date   AICD (automatic cardioverter/defibrillator) present 11/2007   Removed in 2018, a. 11/2007 SJM Current VR - single lead ICD  - Removed 2018 - "it was burning me"   Anxiety    CAD (coronary artery disease)    non-obstructive CAD by Cor CT in 2020 // Myoview 3/22: EF 57, small inf-sept defect c/w scar, no ischemia; low risk      Chest pain    a. 10/2007 Cath:  normal Cors.   CKD (chronic kidney disease), stage II    DDD (degenerative disc disease), lumbar    Diabetes mellitus DX: 2010   type 2   Erosive  esophagitis    a. per EGD (08/2011), Dr. Karilyn Cota - Erosive reflux esophagitis improved but not completely healed since previous EGD 3 years ago. Bx showing  ulcerated gatroesophageal junction mucosa. negative for H. pylori   GERD (gastroesophageal reflux disease)    Gout    Hearing deficit    a. wear bilateral hearing aides   History of hiatal hernia    History of kidney stones    passed stones, no surgery  Hypertension    Mildly dilatd aortic root (HCC)    CMR 4/22: EF 52, no LGE; d/w Dr. Lorretta Harp root 38 mm (mildly dilated)   Myocardial infarction [facility]) 2011   Neuropathy    Feet and legs   Nonischemic dilated cardiomyopathy (HCC)    a. H/O EF as low as 35-40% by LV gram 10/2007;  b. Echo 02/2011 EF 50-55%, inf HK, Gr 1 DD // CMR 4/22: EF 52, no LGE; d/w Dr. Lorretta Harp root 38 mm (mildly dilated)    Renal insufficiency    Sleep apnea    pt doesnt use, states"I cant afford one". PCP aware   Stroke (HCC)    mini-stroke in 2014   TIA (transient ischemic attack)    July, 2013   Tobacco abuse, in remission 06/27/2009   Discontinued in 2009     Wears dentures    top plate   WPW (Wolff-Parkinson-White syndrome)    a. s/p RFCA @ [facility] - 1999    Family History  Problem Relation Age of Onset   Heart attack Mother 62   Hypertension Mother    Diabetes Mother    Kidney disease Mother    Breast cancer Mother    Heart attack Father 31   Hypertension Father    Diabetes Father    Stroke Brother    Heart attack Brother 3   Stroke Maternal Grandmother 79   Diabetes Brother    Hypertension Brother     Past Surgical History:  Procedure Laterality Date   AMPUTATION Left 05/19/2022   Procedure: TRANSMETATARSAL AMPUTATION LEFT FOOT;  Surgeon: Nadara Mustard, MD;  Location: [facility] OR;  Service: Orthopedics;  Laterality: Left;   AMPUTATION Left 06/17/2023   Procedure: LEFT BELOW KNEE AMPUTATION;  Surgeon: Nadara Mustard, MD;  Location: [facility] OR;  Service: Orthopedics;  Laterality: Left;    AMPUTATION Left 08/19/2023   Procedure: LEFT ABOVE KNEE AMPUTATION;  Surgeon: Nadara Mustard, MD;  Location: [facility] OR;  Service: Orthopedics;  Laterality: Left;   APPENDECTOMY     BACK SURGERY     1995   CARDIAC CATHETERIZATION     2009   CARDIAC DEFIBRILLATOR PLACEMENT     ICD was removed in 2018   CHOLECYSTECTOMY     CHOLECYSTECTOMY, LAPAROSCOPIC     11/2007   COLONOSCOPY W/ POLYPECTOMY  2009   ELBOW SURGERY Left 06/2010   ESOPHAGEAL DILATION N/A 11/08/2014   Procedure: ESOPHAGEAL DILATION;  Surgeon: Malissa Hippo, MD;  Location: [facility];  Service: Endoscopy;  Laterality: N/A;  #56,    ESOPHAGOGASTRODUODENOSCOPY  03/31/2012   also 08/2011; [name]   ESOPHAGOGASTRODUODENOSCOPY (EGD) WITH PROPOFOL N/A 11/08/2014   Procedure: ESOPHAGOGASTRODUODENOSCOPY (EGD) WITH PROPOFOL;  Surgeon: Malissa Hippo, MD;  Location: [facility];  Service: Endoscopy;  Laterality: N/A;  Hiatus is 85 , GE Junction is 37   FOOT ARTHRODESIS Left 10/24/2020   Procedure: LEFT GASTROCNEMIUS RECESSION, DORSIFLEXION OSTEOTOMY 1ST MT;  Surgeon: Nadara Mustard, MD;  Location: [facility] OR;  Service: Orthopedics;  Laterality: Left;   FOOT ARTHRODESIS Right 01/09/2021   Procedure: CLOSING WEDGE OSTEOTOMY RIGHT 1ST METATARSAL;  Surgeon: Nadara Mustard, MD;  Location: [facility] OR;  Service: Orthopedics;  Laterality: Right;   GASTROCNEMIUS RECESSION Right 01/09/2021   Procedure: RIGHT GASTROCNEMIUS RECESSION;  Surgeon: Nadara Mustard, MD;  Location: [facility] OR;  Service: Orthopedics;  Laterality: Right;   ICD LEAD REMOVAL N/A 10/07/2016   Procedure: ICD LEAD REMOVAL ;  Surgeon: Marinus Maw, MD;  Location: [facility] OR;  Service: Cardiovascular;  Laterality: N/A;   MULTIPLE TOOTH EXTRACTIONS     RADIOFREQUENCY ABLATION  1999   WPW; performed at [facility]   STUMP REVISION Left 07/22/2023   Procedure: REVISION LEFT BELOW KNEE AMPUTATION;  Surgeon: Nadara Mustard, MD;  Location: [facility] OR;  Service: Orthopedics;  Laterality: Left;   TEE WITHOUT CARDIOVERSION  N/A 10/07/2016   Procedure: TRANSESOPHAGEAL ECHOCARDIOGRAM (TEE);  Surgeon: Marinus Maw, MD;  Location: [facility] OR;  Service: Cardiovascular;  Laterality: N/A;   Social History   Occupational History   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    Comment: Laid off in 11/11  Tobacco Use   Smoking status: Former    Current packs/day: 0.00    Average packs/day: 1.5 packs/day for 39.1 years (58.6 ttl pk-yrs)    Types: Cigarettes    Start date: 06/30/1971    Quit date: 07/30/2010    Years since quitting: 13.2   Smokeless tobacco: Former    Types: Chew    Quit date: 03/26/1994  Vaping Use   Vaping status: Former   Start date: 07/30/2010   Quit date: 06/29/2016  Substance and Sexual Activity   Alcohol use: No    Alcohol/week: 1.0 standard drink of alcohol    Types: 1 Cans of beer per week    Comment: used "years ago"   Drug use: No   Sexual activity: Yes    Birth control/protection: None

## 2023-11-02 ENCOUNTER — Encounter (HOSPITAL_COMMUNITY): Payer: Self-pay

## 2023-11-02 ENCOUNTER — Other Ambulatory Visit: Payer: Self-pay

## 2023-11-02 ENCOUNTER — Observation Stay (HOSPITAL_COMMUNITY)
Admission: EM | Admit: 2023-11-02 | Discharge: 2023-11-03 | Disposition: A | Discharge status: Home / Self Care | Attending: Family Medicine | Admitting: Family Medicine

## 2023-11-02 ENCOUNTER — Emergency Department (HOSPITAL_COMMUNITY)

## 2023-11-02 DIAGNOSIS — I130 Hypertensive heart and chronic kidney disease with heart failure and stage 1 through stage 4 chronic kidney disease, or unspecified chronic kidney disease: Secondary | ICD-10-CM | POA: Diagnosis not present

## 2023-11-02 DIAGNOSIS — Z89612 Acquired absence of left leg above knee: Secondary | ICD-10-CM | POA: Diagnosis not present

## 2023-11-02 DIAGNOSIS — N400 Enlarged prostate without lower urinary tract symptoms: Secondary | ICD-10-CM | POA: Diagnosis not present

## 2023-11-02 DIAGNOSIS — I10 Essential (primary) hypertension: Secondary | ICD-10-CM | POA: Insufficient documentation

## 2023-11-02 DIAGNOSIS — N183 Chronic kidney disease, stage 3 unspecified: Secondary | ICD-10-CM | POA: Diagnosis not present

## 2023-11-02 DIAGNOSIS — E1142 Type 2 diabetes mellitus with diabetic polyneuropathy: Secondary | ICD-10-CM

## 2023-11-02 DIAGNOSIS — E785 Hyperlipidemia, unspecified: Secondary | ICD-10-CM | POA: Diagnosis not present

## 2023-11-02 DIAGNOSIS — I5040 Unspecified combined systolic (congestive) and diastolic (congestive) heart failure: Secondary | ICD-10-CM | POA: Insufficient documentation

## 2023-11-02 DIAGNOSIS — I77819 Aortic ectasia, unspecified site: Secondary | ICD-10-CM | POA: Diagnosis not present

## 2023-11-02 DIAGNOSIS — Z89432 Acquired absence of left foot: Secondary | ICD-10-CM | POA: Insufficient documentation

## 2023-11-02 DIAGNOSIS — Z7982 Long term (current) use of aspirin: Secondary | ICD-10-CM | POA: Diagnosis not present

## 2023-11-02 DIAGNOSIS — M869 Osteomyelitis, unspecified: Secondary | ICD-10-CM | POA: Diagnosis not present

## 2023-11-02 DIAGNOSIS — Z743 Need for continuous supervision: Secondary | ICD-10-CM | POA: Diagnosis not present

## 2023-11-02 DIAGNOSIS — Z95810 Presence of automatic (implantable) cardiac defibrillator: Secondary | ICD-10-CM | POA: Diagnosis not present

## 2023-11-02 DIAGNOSIS — R739 Hyperglycemia, unspecified: Secondary | ICD-10-CM | POA: Diagnosis not present

## 2023-11-02 DIAGNOSIS — R0602 Shortness of breath: Secondary | ICD-10-CM | POA: Diagnosis not present

## 2023-11-02 DIAGNOSIS — E1122 Type 2 diabetes mellitus with diabetic chronic kidney disease: Secondary | ICD-10-CM | POA: Diagnosis not present

## 2023-11-02 DIAGNOSIS — Z794 Long term (current) use of insulin: Secondary | ICD-10-CM | POA: Diagnosis not present

## 2023-11-02 DIAGNOSIS — R079 Chest pain, unspecified: Secondary | ICD-10-CM | POA: Diagnosis not present

## 2023-11-02 DIAGNOSIS — Z87891 Personal history of nicotine dependence: Secondary | ICD-10-CM | POA: Insufficient documentation

## 2023-11-02 DIAGNOSIS — I2510 Atherosclerotic heart disease of native coronary artery without angina pectoris: Secondary | ICD-10-CM | POA: Insufficient documentation

## 2023-11-02 DIAGNOSIS — Z79899 Other long term (current) drug therapy: Secondary | ICD-10-CM | POA: Diagnosis not present

## 2023-11-02 DIAGNOSIS — R0789 Other chest pain: Principal | ICD-10-CM | POA: Insufficient documentation

## 2023-11-02 DIAGNOSIS — K219 Gastro-esophageal reflux disease without esophagitis: Secondary | ICD-10-CM | POA: Diagnosis not present

## 2023-11-02 DIAGNOSIS — R161 Splenomegaly, not elsewhere classified: Secondary | ICD-10-CM | POA: Diagnosis not present

## 2023-11-02 DIAGNOSIS — Z8673 Personal history of transient ischemic attack (TIA), and cerebral infarction without residual deficits: Secondary | ICD-10-CM | POA: Diagnosis not present

## 2023-11-02 LAB — CBC
HCT: 37.9 % — ABNORMAL LOW (ref 39.0–52.0)
Hemoglobin: 12.9 g/dL — ABNORMAL LOW (ref 13.0–17.0)
MCH: 27.2 pg (ref 26.0–34.0)
MCHC: 34 g/dL (ref 30.0–36.0)
MCV: 79.8 fL — ABNORMAL LOW (ref 80.0–100.0)
Platelets: 181 10*3/uL (ref 150–400)
RBC: 4.75 MIL/uL (ref 4.22–5.81)
RDW: 14.6 % (ref 11.5–15.5)
WBC: 4.5 10*3/uL (ref 4.0–10.5)
nRBC: 0 % (ref 0.0–0.2)

## 2023-11-02 NOTE — ED Triage Notes (Signed)
 Pt complaining of heaviness in the center of the chest. Started after supper. His blood sugar on ems was 423

## 2023-11-03 ENCOUNTER — Other Ambulatory Visit (HOSPITAL_COMMUNITY): Payer: Self-pay | Admitting: *Deleted

## 2023-11-03 ENCOUNTER — Observation Stay (HOSPITAL_BASED_OUTPATIENT_CLINIC_OR_DEPARTMENT_OTHER)

## 2023-11-03 ENCOUNTER — Emergency Department (HOSPITAL_COMMUNITY)

## 2023-11-03 DIAGNOSIS — I5032 Chronic diastolic (congestive) heart failure: Secondary | ICD-10-CM

## 2023-11-03 DIAGNOSIS — R0789 Other chest pain: Secondary | ICD-10-CM

## 2023-11-03 DIAGNOSIS — E1142 Type 2 diabetes mellitus with diabetic polyneuropathy: Secondary | ICD-10-CM | POA: Insufficient documentation

## 2023-11-03 DIAGNOSIS — I77810 Thoracic aortic ectasia: Secondary | ICD-10-CM

## 2023-11-03 DIAGNOSIS — R079 Chest pain, unspecified: Secondary | ICD-10-CM | POA: Diagnosis not present

## 2023-11-03 DIAGNOSIS — I959 Hypotension, unspecified: Secondary | ICD-10-CM

## 2023-11-03 DIAGNOSIS — E785 Hyperlipidemia, unspecified: Secondary | ICD-10-CM | POA: Diagnosis not present

## 2023-11-03 DIAGNOSIS — I10 Essential (primary) hypertension: Secondary | ICD-10-CM | POA: Diagnosis not present

## 2023-11-03 DIAGNOSIS — I2510 Atherosclerotic heart disease of native coronary artery without angina pectoris: Secondary | ICD-10-CM | POA: Diagnosis not present

## 2023-11-03 DIAGNOSIS — K219 Gastro-esophageal reflux disease without esophagitis: Secondary | ICD-10-CM | POA: Insufficient documentation

## 2023-11-03 DIAGNOSIS — R161 Splenomegaly, not elsewhere classified: Secondary | ICD-10-CM | POA: Diagnosis not present

## 2023-11-03 DIAGNOSIS — N400 Enlarged prostate without lower urinary tract symptoms: Secondary | ICD-10-CM | POA: Insufficient documentation

## 2023-11-03 LAB — BASIC METABOLIC PANEL WITH GFR
Anion gap: 15 (ref 5–15)
BUN: 14 mg/dL (ref 8–23)
CO2: 24 mmol/L (ref 22–32)
Calcium: 9.5 mg/dL (ref 8.9–10.3)
Chloride: 96 mmol/L — ABNORMAL LOW (ref 98–111)
Creatinine, Ser: 1.22 mg/dL (ref 0.61–1.24)
GFR, Estimated: >60 mL/min (ref >60)
Glucose, Bld: 329 mg/dL — ABNORMAL HIGH (ref 70–99)
Potassium: 3.7 mmol/L (ref 3.5–5.1)
Sodium: 135 mmol/L (ref 135–145)

## 2023-11-03 LAB — LIPID PANEL
Cholesterol: 89 mg/dL (ref 0–200)
HDL: 23 mg/dL — ABNORMAL LOW (ref >40)
LDL Cholesterol: 54 mg/dL (ref 0–99)
Total CHOL/HDL Ratio: 3.9 ratio
Triglycerides: 61 mg/dL (ref <150)
VLDL: 12 mg/dL (ref 0–40)

## 2023-11-03 LAB — GLUCOSE, CAPILLARY
Glucose-Capillary: 141 mg/dL — ABNORMAL HIGH (ref 70–99)
Glucose-Capillary: 179 mg/dL — ABNORMAL HIGH (ref 70–99)
Glucose-Capillary: 263 mg/dL — ABNORMAL HIGH (ref 70–99)

## 2023-11-03 LAB — ECHOCARDIOGRAM COMPLETE
Area-P 1/2: 2.26 cm2
Height: 73 in
S' Lateral: 2.8 cm
Weight: 3120 [oz_av]

## 2023-11-03 LAB — HIV ANTIBODY (ROUTINE TESTING W REFLEX): HIV Screen 4th Generation wRfx: NONREACTIVE

## 2023-11-03 LAB — TROPONIN I (HIGH SENSITIVITY)
Troponin I (High Sensitivity): 3 ng/L (ref <18)
Troponin I (High Sensitivity): 2 ng/L (ref <18)
Troponin I (High Sensitivity): 3 ng/L (ref <18)
Troponin I (High Sensitivity): 2 ng/L (ref <18)

## 2023-11-03 LAB — CBG MONITORING, ED: Glucose-Capillary: 104 mg/dL — ABNORMAL HIGH (ref 70–99)

## 2023-11-03 MED ORDER — SODIUM CHLORIDE 0.9 % IV SOLN: INTRAVENOUS | Status: DC

## 2023-11-03 MED ORDER — ATORVASTATIN CALCIUM 20 MG PO TABS
20.0000 mg | ORAL_TABLET | Freq: Every morning | ORAL | Status: DC
  Administered 2023-11-03: 20 mg via ORAL
  Filled 2023-11-03: qty 2
  Filled 2023-11-03: qty 1

## 2023-11-03 MED ORDER — ENOXAPARIN SODIUM 40 MG/0.4ML IJ SOSY
40.0000 mg | PREFILLED_SYRINGE | INTRAMUSCULAR | Status: DC
  Administered 2023-11-03: 40 mg via SUBCUTANEOUS
  Filled 2023-11-03: qty 0.4

## 2023-11-03 MED ORDER — SODIUM CHLORIDE 0.9 % IV BOLUS
1000.0000 mL | Freq: Once | INTRAVENOUS | Status: AC
  Administered 2023-11-03: 1000 mL via INTRAVENOUS

## 2023-11-03 MED ORDER — ISOSORBIDE MONONITRATE ER 30 MG PO TB24
30.0000 mg | ORAL_TABLET | Freq: Every day | ORAL | Status: DC
  Administered 2023-11-03: 30 mg via ORAL
  Filled 2023-11-03: qty 1

## 2023-11-03 MED ORDER — GABAPENTIN 300 MG PO CAPS
600.0000 mg | ORAL_CAPSULE | Freq: Three times a day (TID) | ORAL | Status: DC
  Administered 2023-11-03 (×2): 600 mg via ORAL
  Filled 2023-11-03 (×2): qty 2

## 2023-11-03 MED ORDER — FUROSEMIDE 20 MG PO TABS
20.0000 mg | ORAL_TABLET | Freq: Every day | ORAL | Status: DC
  Administered 2023-11-03: 20 mg via ORAL
  Filled 2023-11-03: qty 1

## 2023-11-03 MED ORDER — FAMOTIDINE 20 MG PO TABS
20.0000 mg | ORAL_TABLET | Freq: Every day | ORAL | Status: DC
Start: 2023-11-03 — End: 2023-11-03
  Administered 2023-11-03: 20 mg via ORAL
  Filled 2023-11-03: qty 1

## 2023-11-03 MED ORDER — CARVEDILOL 12.5 MG PO TABS: 12.5000 mg | ORAL_TABLET | Freq: Two times a day (BID) | ORAL | Status: DC

## 2023-11-03 MED ORDER — INSULIN ASPART PROT & ASPART (70-30 MIX) 100 UNIT/ML ~~LOC~~ SUSP
52.0000 [IU] | Freq: Two times a day (BID) | SUBCUTANEOUS | Status: DC
  Filled 2023-11-03: qty 10

## 2023-11-03 MED ORDER — ORAL CARE MOUTH RINSE: 15.0000 mL | OROMUCOSAL | Status: DC | PRN

## 2023-11-03 MED ORDER — MORPHINE SULFATE (PF) 2 MG/ML IV SOLN: 2.0000 mg | INTRAVENOUS | Status: DC | PRN

## 2023-11-03 MED ORDER — VITAMIN B-12 1000 MCG PO TABS
1000.0000 ug | ORAL_TABLET | Freq: Every day | ORAL | Status: DC
  Administered 2023-11-03: 1000 ug via ORAL
  Filled 2023-11-03: qty 1

## 2023-11-03 MED ORDER — PANTOPRAZOLE SODIUM 40 MG PO TBEC
40.0000 mg | DELAYED_RELEASE_TABLET | Freq: Every day | ORAL | Status: DC
  Administered 2023-11-03: 40 mg via ORAL
  Filled 2023-11-03: qty 1

## 2023-11-03 MED ORDER — POTASSIUM CHLORIDE CRYS ER 20 MEQ PO TBCR
20.0000 meq | EXTENDED_RELEASE_TABLET | Freq: Every day | ORAL | Status: DC
  Administered 2023-11-03: 20 meq via ORAL
  Filled 2023-11-03: qty 2

## 2023-11-03 MED ORDER — OXYCODONE HCL 5 MG PO TABS
30.0000 mg | ORAL_TABLET | Freq: Three times a day (TID) | ORAL | Status: DC | PRN
  Administered 2023-11-03: 30 mg via ORAL
  Filled 2023-11-03: qty 6

## 2023-11-03 MED ORDER — ONDANSETRON HCL 4 MG/2ML IJ SOLN: 4.0000 mg | Freq: Four times a day (QID) | INTRAMUSCULAR | Status: DC | PRN

## 2023-11-03 MED ORDER — ACETAMINOPHEN 325 MG PO TABS: 650.0000 mg | ORAL_TABLET | ORAL | Status: DC | PRN

## 2023-11-03 MED ORDER — ZOLPIDEM TARTRATE 5 MG PO TABS: 10.0000 mg | ORAL_TABLET | Freq: Every day | ORAL | Status: DC

## 2023-11-03 MED ORDER — ASPIRIN 81 MG PO TABS: 81.0000 mg | ORAL_TABLET | Freq: Every day | ORAL | 2 refills | Status: AC

## 2023-11-03 MED ORDER — ISOSORBIDE MONONITRATE ER 30 MG PO TB24: 15.0000 mg | ORAL_TABLET | Freq: Every day | ORAL | 3 refills | Status: AC

## 2023-11-03 MED ORDER — TAMSULOSIN HCL 0.4 MG PO CAPS
0.4000 mg | ORAL_CAPSULE | Freq: Every day | ORAL | Status: DC
  Administered 2023-11-03: 0.4 mg via ORAL
  Filled 2023-11-03: qty 1

## 2023-11-03 MED ORDER — INSULIN GLARGINE-YFGN 100 UNIT/ML ~~LOC~~ SOLN
20.0000 [IU] | Freq: Every day | SUBCUTANEOUS | Status: DC
  Filled 2023-11-03: qty 0.2

## 2023-11-03 MED ORDER — CARVEDILOL 3.125 MG PO TABS
3.1250 mg | ORAL_TABLET | Freq: Two times a day (BID) | ORAL | 2 refills | Status: DC
Start: 2023-11-03 — End: 2024-02-27

## 2023-11-03 MED ORDER — CARVEDILOL 3.125 MG PO TABS
3.1250 mg | ORAL_TABLET | Freq: Two times a day (BID) | ORAL | Status: DC
  Administered 2023-11-03: 3.125 mg via ORAL
  Filled 2023-11-03: qty 1

## 2023-11-03 MED ORDER — INSULIN ASPART PROT & ASPART (70-30 MIX) 100 UNIT/ML PEN: 52.0000 [IU] | PEN_INJECTOR | Freq: Two times a day (BID) | SUBCUTANEOUS | Status: DC

## 2023-11-03 MED ORDER — INSULIN ASPART 100 UNIT/ML IJ SOLN
0.0000 [IU] | INTRAMUSCULAR | Status: DC
  Administered 2023-11-03: 2 [IU] via SUBCUTANEOUS
  Administered 2023-11-03: 5 [IU] via SUBCUTANEOUS
  Administered 2023-11-03: 1 [IU] via SUBCUTANEOUS

## 2023-11-03 MED ORDER — INSULIN ASPART PROT & ASPART (70-30 MIX) 100 UNIT/ML PEN
52.0000 [IU] | PEN_INJECTOR | Freq: Two times a day (BID) | SUBCUTANEOUS | Status: DC
Start: 2023-11-04 — End: 2023-11-03

## 2023-11-03 MED ORDER — ACETAMINOPHEN 325 MG PO TABS: 650.0000 mg | ORAL_TABLET | ORAL | Status: AC | PRN

## 2023-11-03 MED ORDER — MORPHINE SULFATE (PF) 4 MG/ML IV SOLN
4.0000 mg | Freq: Once | INTRAVENOUS | Status: AC
  Administered 2023-11-03: 4 mg via INTRAVENOUS
  Filled 2023-11-03: qty 1

## 2023-11-03 MED ORDER — PERFLUTREN LIPID MICROSPHERE
1.0000 mL | INTRAVENOUS | Status: DC | PRN
  Administered 2023-11-03: 3 mL via INTRAVENOUS

## 2023-11-03 MED ORDER — ASPIRIN 81 MG PO CHEW
81.0000 mg | CHEWABLE_TABLET | Freq: Every day | ORAL | Status: DC
  Administered 2023-11-03: 81 mg via ORAL
  Filled 2023-11-03: qty 1

## 2023-11-03 MED ORDER — IOHEXOL 350 MG/ML SOLN
75.0000 mL | Freq: Once | INTRAVENOUS | Status: AC | PRN
  Administered 2023-11-03: 75 mL via INTRAVENOUS

## 2023-11-03 MED ORDER — ALPRAZOLAM 0.25 MG PO TABS: 0.2500 mg | ORAL_TABLET | Freq: Two times a day (BID) | ORAL | Status: DC | PRN

## 2023-11-03 MED ORDER — NITROGLYCERIN 0.4 MG SL SUBL: 0.4000 mg | SUBLINGUAL_TABLET | SUBLINGUAL | Status: DC | PRN

## 2023-11-03 NOTE — Discharge Summary (Signed)
 Mike Floyd, is a 63 y.o. male  DOB 02/06/1961  MRN [id number].  Admission date:  11/02/2023  Admitting Physician  Hannah Beat, MD  Discharge Date:  11/03/2023   Primary MD  Elfredia Nevins, MD  Recommendations for primary care physician for things to follow:  1)Very Low-salt diet advised---Less than 2 gm of Sodium per day advised----ok to use Mrs DASH salt substitute instead of Salt 2)Weigh yourself daily, call if you gain more than 3 pounds in 1 day or more than 5 pounds in 1 week as your diuretic medications may need to be adjusted 3)Avoid ibuprofen/Advil/Aleve/Motrin/Goody Powders/Naproxen/BC powders/Meloxicam/Diclofenac/Indomethacin and other Nonsteroidal anti-inflammatory medications as these will make you more likely to bleed and can cause stomach ulcers, can also cause Kidney problems.  4)Please decrease the isosorbide/Imdur to one-half a tablet daily 5)Please reduce Coreg/carvedilol to 3.125 mg twice daily (from 12.5 mg )  Admission Diagnosis  Chest pain [R07.9] Chest pain, unspecified type [R07.9]   Discharge Diagnosis  Chest pain [R07.9] Chest pain, unspecified type [R07.9]    Principal Problem:   Chest pain Active Problems:   Dyslipidemia   Essential hypertension   Type 2 diabetes mellitus with peripheral neuropathy (HCC)   BPH (benign prostatic hyperplasia)   GERD without esophagitis      Past Medical History:  Diagnosis Date   AICD (automatic cardioverter/defibrillator) present 11/2007   Removed in 2018, a. 11/2007 [facility] Current VR - single lead ICD  - Removed 2018 - "it was burning me"   Anxiety    CAD (coronary artery disease)    non-obstructive CAD by Cor CT in 2020 // Myoview 3/22: EF 57, small inf-sept defect c/w scar, no ischemia; low risk      Chest pain    a. 10/2007 Cath:  normal Cors.   CKD (chronic kidney disease), stage II    DDD (degenerative disc disease), lumbar     Diabetes mellitus DX: 2010   type 2   Erosive esophagitis    a. per EGD (08/2011), Dr. Karilyn Cota - Erosive reflux esophagitis improved but not completely healed since previous EGD 3 years ago. Bx showing  ulcerated gatroesophageal junction mucosa. negative for H. pylori   GERD (gastroesophageal reflux disease)    Gout    Hearing deficit    a. wear bilateral hearing aides   History of hiatal hernia    History of kidney stones    passed stones, no surgery   Hypertension    Mildly dilatd aortic root (HCC)    CMR 4/22: EF 52, no LGE; d/w Dr. Lorretta Harp root 38 mm (mildly dilated)   Myocardial infarction [facility]) 2011   Neuropathy    Feet and legs   Nonischemic dilated cardiomyopathy (HCC)    a. H/O EF as low as 35-40% by LV gram 10/2007;  b. Echo 02/2011 EF 50-55%, inf HK, Gr 1 DD // CMR 4/22: EF 52, no LGE; d/w Dr. Lorretta Harp root 38 mm (mildly dilated)    Renal insufficiency    Sleep apnea  pt doesnt use, states"I cant afford one". PCP aware   Stroke (HCC)    mini-stroke in 2014   TIA (transient ischemic attack)    July, 2013   Tobacco abuse, in remission 06/27/2009   Discontinued in 2009     Wears dentures    top plate   WPW (Wolff-Parkinson-White syndrome)    a. s/p RFCA @ [place] - 1999    Past Surgical History:  Procedure Laterality Date   AMPUTATION Left 05/19/2022   Procedure: TRANSMETATARSAL AMPUTATION LEFT FOOT;  Surgeon: Nadara Mustard, MD;  Location: [facility] OR;  Service: Orthopedics;  Laterality: Left;   AMPUTATION Left 06/17/2023   Procedure: LEFT BELOW KNEE AMPUTATION;  Surgeon: Nadara Mustard, MD;  Location: [facility] OR;  Service: Orthopedics;  Laterality: Left;   AMPUTATION Left 08/19/2023   Procedure: LEFT ABOVE KNEE AMPUTATION;  Surgeon: Nadara Mustard, MD;  Location: [facility] OR;  Service: Orthopedics;  Laterality: Left;   APPENDECTOMY     BACK SURGERY     1995   CARDIAC CATHETERIZATION     2009   CARDIAC DEFIBRILLATOR PLACEMENT     ICD was removed in 2018   CHOLECYSTECTOMY      CHOLECYSTECTOMY, LAPAROSCOPIC     11/2007   COLONOSCOPY W/ POLYPECTOMY  2009   ELBOW SURGERY Left 06/2010   ESOPHAGEAL DILATION N/A 11/08/2014   Procedure: ESOPHAGEAL DILATION;  Surgeon: Malissa Hippo, MD;  Location: [facility] ORS;  Service: Endoscopy;  Laterality: N/A;  #56,    ESOPHAGOGASTRODUODENOSCOPY  03/31/2012   also 08/2011; [name]   ESOPHAGOGASTRODUODENOSCOPY (EGD) WITH PROPOFOL N/A 11/08/2014   Procedure: ESOPHAGOGASTRODUODENOSCOPY (EGD) WITH PROPOFOL;  Surgeon: Malissa Hippo, MD;  Location: [facility] ORS;  Service: Endoscopy;  Laterality: N/A;  Hiatus is 20 , GE Junction is 37   FOOT ARTHRODESIS Left 10/24/2020   Procedure: LEFT GASTROCNEMIUS RECESSION, DORSIFLEXION OSTEOTOMY 1ST MT;  Surgeon: Nadara Mustard, MD;  Location: [facility] OR;  Service: Orthopedics;  Laterality: Left;   FOOT ARTHRODESIS Right 01/09/2021   Procedure: CLOSING WEDGE OSTEOTOMY RIGHT 1ST METATARSAL;  Surgeon: Nadara Mustard, MD;  Location: [facility] OR;  Service: Orthopedics;  Laterality: Right;   GASTROCNEMIUS RECESSION Right 01/09/2021   Procedure: RIGHT GASTROCNEMIUS RECESSION;  Surgeon: Nadara Mustard, MD;  Location: [facility] OR;  Service: Orthopedics;  Laterality: Right;   ICD LEAD REMOVAL N/A 10/07/2016   Procedure: ICD LEAD REMOVAL ;  Surgeon: Marinus Maw, MD;  Location: [facility] OR;  Service: Cardiovascular;  Laterality: N/A;   MULTIPLE TOOTH EXTRACTIONS     RADIOFREQUENCY ABLATION  1999   WPW; performed at [facility]   STUMP REVISION Left 07/22/2023   Procedure: REVISION LEFT BELOW KNEE AMPUTATION;  Surgeon: Nadara Mustard, MD;  Location: [facility] OR;  Service: Orthopedics;  Laterality: Left;   TEE WITHOUT CARDIOVERSION N/A 10/07/2016   Procedure: TRANSESOPHAGEAL ECHOCARDIOGRAM (TEE);  Surgeon: Marinus Maw, MD;  Location: [facility] OR;  Service: Cardiovascular;  Laterality: N/A;     HPI  from the history and physical done on the day of admission:   Mike Floyd is a 63 y.o. Caucasian male with medical history significant for coronary  artery disease, stage III chronic kidney disease, type 2 diabetes mellitus, GERD, gout, and hypertension, as well as peripheral neuropathy, who presented to the emergency room with acute onset of right parasternal chest pain felt as pressure and graded 7/10 in severity with associated dyspnea without palpitations.  Did not any nausea or vomiting or diaphoresis.  No  radiation to his pain.  He denied any cough or wheezing or hemoptysis.  No leg pain or edema.  He has had left lower extremity amputations in November, December and January and the last was above-knee amputation.  No dysuria, oliguria or hematuria or flank pain.  No other bleeding diathesis.   ED Course: Upon presentation to the ER, BP was 90/63 and later 103/62 with otherwise normal vital signs.  Labs revealed hyperglycemia of 329 with chloride of 96 and otherwise unremarkable BMP.  High-sensitivity troponin I was 3 and later 2.  CBC showed anemia with hemoglobin of 12.9 and hematocrit 37.9 with microcytosis. EKG as reviewed by me : EKG showed sinus tachycardia with rate of 106. Imaging: Two-view chest x-ray showed no acute cardiopulmonary disease. Chest CTA revealed:cno evidence for PE or active pulmonary disease.  It showed splenic enlargement.   The patient was given 4 mg of IV morphine sulfate and 1 L bolus of IV normal saline.  He will be admitted to an observation telemetry bed for further evaluation and management.   Hospital Course:   Assessment and Plan: 1)Atypical Chest pain -. EKG showed sinus tachycardia, no ischemia. High sensitivity troponins where 3, 2, 3, 2. Chest pain-free upon arrival to the ER. CT cardiac in October 2024 showed nonobstructive CAD and chronic score of 119.  -Cardiology consult appreciated , per cardiologist chest pain is noncardiac, recommends no further ischemia workup -Continue aspirin , atorvastatin and isosorbide (at reduced dose)  2)HTN--BP has been soft.  Cardiology recommendation with decrease  Coreg to 3.125 mg twice daily from 12.5 mg -Also decrease isosorbide/Imdur to 50 mg from 30 mg  3)HFimpEF (40 to 45% in 2020 that improved to 55% in 2023): Compensated  -Repeat Echo 11/03/23----showed EF of 60 to 65 %, GDD1,  C/n Lasix Coreg adjusted  4)Aortic root dilatation 41 mm: Cardiologist recommends outpatient surveillance with annual CT.   5)GERD without esophagitis/esophageal dysmotility/stricture  -Continue PPI and consider GI follow-up if symptoms worsen  6)Osteomyelitis - This was a recurrent issue for the patient and he required BKA in 06/2023 and ultimately underwent AKA in 08/2023.  7)BPH (benign prostatic hyperplasia) -continue Flomax.  8)Type 2 diabetes mellitus with peripheral neuropathy (HCC) - -A1c 7.7 reflecting uncontrolled DM with hyperglycemia PTA -c/n Neurontin  Discharge Condition: Stable  Follow UP   Follow-up Information     Elfredia Nevins, MD. Schedule an appointment as soon as possible for a visit.   Specialty: Internal Medicine Why: If symptoms worsen Contact information: [address] [place] [address] [phone]                 Consults obtained - Cardiology  Diet and Activity recommendation:  As advised  Discharge Instructions    Discharge Instructions     Call MD for:  difficulty breathing, headache or visual disturbances   Complete by: As directed    Call MD for:  persistant dizziness or light-headedness   Complete by: As directed    Call MD for:  persistant nausea and vomiting   Complete by: As directed    Call MD for:  temperature >100.4   Complete by: As directed    Diet - low sodium heart healthy   Complete by: As directed    Diet Carb Modified   Complete by: As directed    Discharge instructions   Complete by: As directed    1)Very Low-salt diet advised---Less than 2 gm of Sodium per day advised----ok to use Mrs DASH salt  substitute instead of Salt 2)Weigh yourself daily, call if you gain more  than 3 pounds in 1 day or more than 5 pounds in 1 week as your diuretic medications may need to be adjusted 3)Avoid ibuprofen/Advil/Aleve/Motrin/Goody Powders/Naproxen/BC powders/Meloxicam/Diclofenac/Indomethacin and other Nonsteroidal anti-inflammatory medications as these will make you more likely to bleed and can cause stomach ulcers, can also cause Kidney problems.  4)Please decrease the isosorbide/Imdur to one-half a tablet daily 5)Please reduce Coreg/carvedilol to 3.125 mg twice daily (from 12.5 mg )   Increase activity slowly   Complete by: As directed        Discharge Medications     Allergies as of 11/03/2023   No Known Allergies      Medication List     TAKE these medications    acetaminophen 325 MG tablet Commonly known as: TYLENOL Take 2 tablets (650 mg total) by mouth every 4 (four) hours as needed for headache or mild pain (pain score 1-3).   aspirin 81 MG tablet Take 1 tablet (81 mg total) by mouth daily with breakfast. What changed: when to take this   atorvastatin 20 MG tablet Commonly known as: LIPITOR Take 20 mg by mouth in the morning.   carvedilol 3.125 MG tablet Commonly known as: COREG Take 1 tablet (3.125 mg total) by mouth 2 (two) times daily. What changed:  medication strength how much to take   cyanocobalamin 1000 MCG tablet Commonly known as: VITAMIN B12 Take 1,000 mcg by mouth daily.   famotidine 20 MG tablet Commonly known as: PEPCID Take 20 mg by mouth daily.   furosemide 20 MG tablet Commonly known as: Lasix Take 1 tablet (20 mg total) by mouth daily.   gabapentin 600 MG tablet Commonly known as: NEURONTIN Take 600 mg by mouth 3 (three) times daily.   insulin glargine 100 UNIT/ML injection Commonly known as: LANTUS Inject 20 Units into the skin at bedtime.   isosorbide mononitrate 30 MG 24 hr tablet Commonly known as: IMDUR Take 0.5 tablets (15 mg total) by mouth daily. What changed: how much to take   nitroGLYCERIN 0.4  MG SL tablet Commonly known as: NITROSTAT Place 1 tablet (0.4 mg total) under the tongue every 5 (five) minutes as needed for chest pain.   NovoLOG Mix 70/30 FlexPen (70-30) 100 UNIT/ML FlexPen Generic drug: insulin aspart protamine - aspart Inject 52 Units into the skin 2 (two) times daily.   oxycodone 30 MG immediate release tablet Commonly known as: ROXICODONE Take 1 tablet (30 mg total) by mouth every 8 (eight) hours as needed for pain.   pantoprazole 40 MG tablet Commonly known as: PROTONIX Take 40 mg by mouth daily.   potassium chloride SA 20 MEQ tablet Commonly known as: KLOR-CON M TAKE (1) TABLET BY MOUTH ONCE DAILY.   tamsulosin 0.4 MG Caps capsule Commonly known as: FLOMAX Take 0.4 mg by mouth daily. am   zolpidem 10 MG tablet Commonly known as: AMBIEN Take 10 mg by mouth at bedtime.       Major procedures and Radiology Reports - PLEASE review detailed and final reports for all details, in brief -   ECHOCARDIOGRAM COMPLETE Result Date: 11/03/2023    ECHOCARDIOGRAM REPORT   Patient Name:   Mike Floyd Date of Exam: 11/03/2023 Medical Rec #:  [id number]     Height:       73.0 in Accession #:    [id number]    Weight:       195.0 lb Date of Birth:  May 04, 1961    BSA:          2.129 m Patient Age:    62 years      BP:           116/70 mmHg Patient Gender: M             HR:           79 bpm. [name] Procedure: 2D Echo, Cardiac Doppler, Color Doppler, Strain Analysis and            Intracardiac Opacification Agent (Both Spectral and Color Flow            Doppler were utilized during procedure). Indications:    Chest Pain R07.9  History:        Patient has prior history of Echocardiogram examinations, most                 recent 05/16/2022. Cardiomyopathy, CAD and Previous Myocardial                 Infarction, Defibrillator; Risk Factors:Hypertension and                 Diabetes.  Sonographer:    Celesta Gentile RCS Referring Phys: [id number] Ellsworth Lennox   Sonographer Comments: Technically difficult study due to poor echo windows. Global longitudinal strain was attempted. IMPRESSIONS  1. No evidence of LV thrombus by Definity. Left ventricular ejection fraction, by estimation, is 60 to 65%. The left ventricle has normal function. The left ventricle has no regional wall motion abnormalities. Left ventricular diastolic parameters are consistent with Grade I diastolic dysfunction (impaired relaxation). Elevated left ventricular end-diastolic pressure.  2. Right ventricular systolic function is normal. The right ventricular size is normal. Tricuspid regurgitation signal is inadequate for assessing PA pressure.  3. The mitral valve is normal in structure. No evidence of mitral valve regurgitation. No evidence of mitral stenosis.  4. The aortic valve has an indeterminant number of cusps. Aortic valve regurgitation is not visualized. No aortic stenosis is present.  5. Aortic dilatation noted. There is borderline dilatation of the aortic root, measuring 39 mm.  6. The inferior vena cava is normal in size with greater than 50% respiratory variability, suggesting right atrial pressure of 3 mmHg. FINDINGS  Left Ventricle: No evidence of LV thrombus by Definity. Left ventricular ejection fraction, by estimation, is 60 to 65%. The left ventricle has normal function. The left ventricle has no regional wall motion abnormalities. Definity contrast agent was given IV to delineate the left ventricular endocardial borders. Strain was performed and the global longitudinal strain is indeterminate. The left ventricular internal cavity size was normal in size. There is no left ventricular hypertrophy. Left ventricular diastolic parameters are consistent with Grade I diastolic dysfunction (impaired relaxation). Elevated left ventricular end-diastolic pressure. Right Ventricle: The right ventricular size is normal. No increase in right ventricular wall thickness. Right ventricular systolic  function is normal. Tricuspid regurgitation signal is inadequate for assessing PA pressure. Left Atrium: Left atrial size was normal in size. Right Atrium: Right atrial size was normal in size. Pericardium: There is no evidence of pericardial effusion. Mitral Valve: The mitral valve is normal in structure. No evidence of mitral valve regurgitation. No evidence of mitral valve stenosis. Tricuspid Valve: The tricuspid valve is normal in structure. Tricuspid valve regurgitation is not demonstrated. No evidence of tricuspid stenosis. Aortic Valve: The aortic valve has an indeterminant number of cusps. Aortic valve regurgitation is not visualized.  No aortic stenosis is present. Pulmonic Valve: The pulmonic valve was not well visualized. Pulmonic valve regurgitation is not visualized. No evidence of pulmonic stenosis. Aorta: Aortic dilatation noted. There is borderline dilatation of the aortic root, measuring 39 mm. Venous: The inferior vena cava is normal in size with greater than 50% respiratory variability, suggesting right atrial pressure of 3 mmHg. IAS/Shunts: No atrial level shunt detected by color flow Doppler. Additional Comments: 3D was performed not requiring image post processing on an independent workstation and was indeterminate.  LEFT VENTRICLE PLAX 2D LVIDd:         4.20 cm   Diastology LVIDs:         2.80 cm   LV e' medial:    6.74 cm/s LV PW:         0.80 cm   LV E/e' medial:  17.8 LV IVS:        0.90 cm   LV e' lateral:   8.92 cm/s LVOT diam:     2.10 cm   LV E/e' lateral: 13.5 LV SV:         72 LV SV Index:   34 LVOT Area:     3.46 cm  RIGHT VENTRICLE TAPSE (M-mode): 2.5 cm LEFT ATRIUM             Index        RIGHT ATRIUM           Index LA diam:        3.10 cm 1.46 cm/m   RA Area:     15.10 cm LA Vol (A2C):   62.1 ml 29.17 ml/m  RA Volume:   38.40 ml  18.04 ml/m LA Vol (A4C):   48.4 ml 22.74 ml/m LA Biplane Vol: 59.4 ml 27.91 ml/m  AORTIC VALVE LVOT Vmax:   83.00 cm/s LVOT Vmean:  64.500 cm/s  LVOT VTI:    0.208 m  AORTA Ao Root diam: 3.90 cm MITRAL VALVE MV Area (PHT): 2.26 cm     SHUNTS MV Decel Time: 335 msec     Systemic VTI:  0.21 m MV E velocity: 120.00 cm/s  Systemic Diam: 2.10 cm MV A velocity: 133.00 cm/s MV E/A ratio:  0.90 Vishnu Priya Mallipeddi Electronically signed by Winfield Rast Mallipeddi Signature Date/Time: 11/03/2023/4:57:58 PM    Final    CT Angio Chest Pulmonary Embolism (PE) W or WO Contrast Result Date: 11/03/2023 CLINICAL DATA:  Pulmonary embolus suspected with high probability. Heaviness in the central chest. High blood sugar. EXAM: CT ANGIOGRAPHY CHEST WITH CONTRAST TECHNIQUE: Multidetector CT imaging of the chest was performed using the standard protocol during bolus administration of intravenous contrast. Multiplanar CT image reconstructions and MIPs were obtained to evaluate the vascular anatomy. RADIATION DOSE REDUCTION: This exam was performed according to the departmental dose-optimization program which includes automated exposure control, adjustment of the mA and/or kV according to patient size and/or use of iterative reconstruction technique. CONTRAST:  75mL OMNIPAQUE IOHEXOL 350 MG/ML SOLN COMPARISON:  Chest radiograph 11/02/2023. CT chest abdomen and pelvis 05/04/2023 FINDINGS: Cardiovascular: Technically adequate study with good opacification of the central and segmental pulmonary arteries. Mild motion artifact. No focal filling defects are demonstrated. No evidence of significant pulmonary embolus. Normal heart size. No pericardial effusions. Normal caliber thoracic aorta. No aortic dissection. Scattered aortic calcification. Mild calcification of coronary arteries. Mediastinum/Nodes: Thyroid gland is unremarkable. Esophagus is decompressed. Scattered lymph nodes are not pathologically enlarged. Lungs/Pleura: Small scattered calcified granulomas in the lungs. No significant pulmonary  nodules. No airspace disease or consolidation in the lungs. No pleural effusion  or pneumothorax. Upper Abdomen: Surgical absence of the gallbladder. Spleen is enlarged. No acute abnormality seen. Musculoskeletal: Degenerative changes in the spine. No acute bony abnormalities. Review of the MIP images confirms the above findings. IMPRESSION: 1. No evidence of significant pulmonary embolus. 2. No evidence of active pulmonary disease. 3. Splenic enlargement. Electronically Signed   By: Burman Nieves M.D.   On: 11/03/2023 01:24   DG Chest 2 View Result Date: 11/02/2023 CLINICAL DATA:  Chest pain chest x-ray 05/04/2023 EXAM: CHEST - 2 VIEW COMPARISON:  None Available. FINDINGS: The heart size and mediastinal contours are within normal limits. Both lungs are clear. The visualized skeletal structures are unremarkable. IMPRESSION: No active cardiopulmonary disease. Electronically Signed   By: Darliss Cheney M.D.   On: 11/02/2023 23:29   Today   Subjective    Mike Floyd today has no new complaints  No fever  Or chills   No Nausea, Vomiting or Diarrhea No further chest pains, no Dyspnea   Patient has been seen and examined prior to discharge   Objective   Blood pressure 96/65, pulse 82, temperature 98 F (36.7 C), temperature source Oral, resp. rate 17, height 6\' 1"  (1.854 m), weight 88.5 kg, SpO2 100%.   Intake/Output Summary (Last 24 hours) at 11/03/2023 1710 Last data filed at 11/03/2023 1300 Gross per 24 hour  Intake 1240 ml  Output 425 ml  Net 815 ml   Exam Gen:- Awake Alert, no acute distress  HEENT:- [place].AT, No sclera icterus Neck-Supple Neck,No JVD,.  Lungs-  CTAB , good air movement bilaterally CV- S1, S2 normal, regular Abd-  +ve B.Sounds, Abd Soft, No tenderness,    Extremity- Lt AKA Psych-affect is appropriate, oriented x3 Neuro-no new focal deficits, no tremors    Data Review   CBC w Diff:  Lab Results  Component Value Date   WBC 4.5 11/02/2023   HGB 12.9 (L) 11/02/2023   HGB 15.2 09/03/2016   HCT 37.9 (L) 11/02/2023   HCT 43.5 09/03/2016    PLT 181 11/02/2023   PLT 121 (L) 09/03/2016   LYMPHOPCT 21 06/17/2023   MONOPCT 7 06/17/2023   EOSPCT 2 06/17/2023   BASOPCT 2 06/17/2023   CMP:  Lab Results  Component Value Date   NA 135 11/02/2023   NA 134 03/14/2023   K 3.7 11/02/2023   CL 96 (L) 11/02/2023   CO2 24 11/02/2023   BUN 14 11/02/2023   BUN 17 03/14/2023   CREATININE 1.22 11/02/2023   CREATININE 1.37 (H) 06/27/2019   PROT 6.3 (L) 06/17/2023   ALBUMIN 4.0 06/17/2023   BILITOT 2.0 (H) 06/17/2023   ALKPHOS 107 06/17/2023   AST 19 06/17/2023   ALT 19 06/17/2023   Total Discharge time is about 33 minutes  Shon Hale M.D on 11/03/2023 at 5:10 PM  Go to [email or web address] -  for contact info  [facility] - Office  [phone]

## 2023-11-03 NOTE — Assessment & Plan Note (Signed)
-   The patient will be admitted to an observation cardiac telemetry bed. - Will follow serial troponins and EKGs. - The patient will be placed on aspirin as well as p.r.n. sublingual nitroglycerin and morphine sulfate for pain. - We will obtain a cardiology consult in a.m. for further cardiac risk stratification. - I notified Dr. Mikal Plane about the patient.

## 2023-11-03 NOTE — ED Notes (Addendum)
 .ED TO INPATIENT HANDOFF REPORT  ED Nurse Name and Phone #: [phone]  S Name/Age/Gender Mike Floyd 63 y.o. male Room/Bed: APA03/APA03  Code Status   Code Status: Limited: Do not attempt resuscitation (DNR) -DNR-LIMITED -Do Not Intubate/DNI   Home/SNF/Other Home Patient oriented to: self, place, time, and situation Is this baseline? Yes   Triage Complete: Triage complete  Chief Complaint Chest pain [R07.9]  Triage Note Pt complaining of heaviness in the center of the chest. Started after supper. His blood sugar on ems was 423   Allergies No Known Allergies  Level of Care/Admitting Diagnosis ED Disposition     ED Disposition  Admit   Condition  --   Comment  Hospital Area: [facility] [[id number]]  Level of Care: Telemetry [5]  Covid Evaluation: Asymptomatic - no recent exposure (last 10 days) testing not required  Diagnosis: Chest pain [744799]  Admitting Physician: Hannah Beat [[id number]]  Attending Physician: Hannah Beat [[id number]]          B Medical/Surgery History Past Medical History:  Diagnosis Date   AICD (automatic cardioverter/defibrillator) present 11/2007   Removed in 2018, a. 11/2007 SJM Current VR - single lead ICD  - Removed 2018 - "it was burning me"   Anxiety    CAD (coronary artery disease)    non-obstructive CAD by Cor CT in 2020 // Myoview 3/22: EF 57, small inf-sept defect c/w scar, no ischemia; low risk      Chest pain    a. 10/2007 Cath:  normal Cors.   CKD (chronic kidney disease), stage II    DDD (degenerative disc disease), lumbar    Diabetes mellitus DX: 2010   type 2   Erosive esophagitis    a. per EGD (08/2011), Dr. Karilyn Cota - Erosive reflux esophagitis improved but not completely healed since previous EGD 3 years ago. Bx showing  ulcerated gatroesophageal junction mucosa. negative for H. pylori   GERD (gastroesophageal reflux disease)    Gout    Hearing deficit    a. wear bilateral hearing aides   History of hiatal hernia     History of kidney stones    passed stones, no surgery   Hypertension    Mildly dilatd aortic root (HCC)    CMR 4/22: EF 52, no LGE; d/w Dr. Lorretta Harp root 38 mm (mildly dilated)   Myocardial infarction [facility]) 2011   Neuropathy    Feet and legs   Nonischemic dilated cardiomyopathy (HCC)    a. H/O EF as low as 35-40% by LV gram 10/2007;  b. Echo 02/2011 EF 50-55%, inf HK, Gr 1 DD // CMR 4/22: EF 52, no LGE; d/w Dr. Lorretta Harp root 38 mm (mildly dilated)    Renal insufficiency    Sleep apnea    pt doesnt use, states"I cant afford one". PCP aware   Stroke (HCC)    mini-stroke in 2014   TIA (transient ischemic attack)    July, 2013   Tobacco abuse, in remission 06/27/2009   Discontinued in 2009     Wears dentures    top plate   WPW (Wolff-Parkinson-White syndrome)    a. s/p RFCA @ [facility] - 1999   Past Surgical History:  Procedure Laterality Date   AMPUTATION Left 05/19/2022   Procedure: TRANSMETATARSAL AMPUTATION LEFT FOOT;  Surgeon: Nadara Mustard, MD;  Location: [facility] OR;  Service: Orthopedics;  Laterality: Left;   AMPUTATION Left 06/17/2023   Procedure: LEFT BELOW KNEE AMPUTATION;  Surgeon: Nadara Mustard, MD;  Location: [facility] OR;  Service: Orthopedics;  Laterality: Left;   AMPUTATION Left 08/19/2023   Procedure: LEFT ABOVE KNEE AMPUTATION;  Surgeon: Nadara Mustard, MD;  Location: [facility] OR;  Service: Orthopedics;  Laterality: Left;   APPENDECTOMY     BACK SURGERY     1995   CARDIAC CATHETERIZATION     2009   CARDIAC DEFIBRILLATOR PLACEMENT     ICD was removed in 2018   CHOLECYSTECTOMY     CHOLECYSTECTOMY, LAPAROSCOPIC     11/2007   COLONOSCOPY W/ POLYPECTOMY  2009   ELBOW SURGERY Left 06/2010   ESOPHAGEAL DILATION N/A 11/08/2014   Procedure: ESOPHAGEAL DILATION;  Surgeon: Malissa Hippo, MD;  Location: [facility] ORS;  Service: Endoscopy;  Laterality: N/A;  #56,    ESOPHAGOGASTRODUODENOSCOPY  03/31/2012   also 08/2011; [name]   ESOPHAGOGASTRODUODENOSCOPY (EGD) WITH PROPOFOL N/A 11/08/2014    Procedure: ESOPHAGOGASTRODUODENOSCOPY (EGD) WITH PROPOFOL;  Surgeon: Malissa Hippo, MD;  Location: [facility] ORS;  Service: Endoscopy;  Laterality: N/A;  Hiatus is 21 , GE Junction is 37   FOOT ARTHRODESIS Left 10/24/2020   Procedure: LEFT GASTROCNEMIUS RECESSION, DORSIFLEXION OSTEOTOMY 1ST MT;  Surgeon: Nadara Mustard, MD;  Location: [facility] OR;  Service: Orthopedics;  Laterality: Left;   FOOT ARTHRODESIS Right 01/09/2021   Procedure: CLOSING WEDGE OSTEOTOMY RIGHT 1ST METATARSAL;  Surgeon: Nadara Mustard, MD;  Location: [facility] OR;  Service: Orthopedics;  Laterality: Right;   GASTROCNEMIUS RECESSION Right 01/09/2021   Procedure: RIGHT GASTROCNEMIUS RECESSION;  Surgeon: Nadara Mustard, MD;  Location: [facility] OR;  Service: Orthopedics;  Laterality: Right;   ICD LEAD REMOVAL N/A 10/07/2016   Procedure: ICD LEAD REMOVAL ;  Surgeon: Marinus Maw, MD;  Location: [facility] OR;  Service: Cardiovascular;  Laterality: N/A;   MULTIPLE TOOTH EXTRACTIONS     RADIOFREQUENCY ABLATION  1999   WPW; performed at [facility]   STUMP REVISION Left 07/22/2023   Procedure: REVISION LEFT BELOW KNEE AMPUTATION;  Surgeon: Nadara Mustard, MD;  Location: [facility] OR;  Service: Orthopedics;  Laterality: Left;   TEE WITHOUT CARDIOVERSION N/A 10/07/2016   Procedure: TRANSESOPHAGEAL ECHOCARDIOGRAM (TEE);  Surgeon: Marinus Maw, MD;  Location: [facility] OR;  Service: Cardiovascular;  Laterality: N/A;     A IV Location/Drains/Wounds Patient Lines/Drains/Airways Status     Active Line/Drains/Airways     Name Placement date Placement time Site Days   Peripheral IV 11/02/23 20 G 1" Anterior;Proximal;Right Forearm 11/02/23  2257  Forearm  1   Wound / Incision (Open or Dehisced) 05/13/22 Diabetic ulcer Foot Left;Posterior;Upper 05/13/22  1700  Foot  539   Wound / Incision (Open or Dehisced) 07/22/23 Diabetic ulcer Heel Right round upper posterior pad of foot, 2 small round mid foot 07/22/23  1406  Heel  104   Wound / Incision (Open or Dehisced) 08/19/23  Other (Comment) Foot Right 08/19/23  1200  Foot  76            Intake/Output Last 24 hours  Intake/Output Summary (Last 24 hours) at 11/03/2023 0981 Last data filed at 11/03/2023 0134 Gross per 24 hour  Intake 1000 ml  Output --  Net 1000 ml    Labs/Imaging Results for orders placed or performed during the hospital encounter of 11/02/23 (from the past 48 hours)  Basic metabolic panel     Status: Abnormal   Collection Time: 11/02/23 10:58 PM  Result Value Ref Range   Sodium 135 135 - 145 mmol/L   Potassium 3.7 3.5 -  5.1 mmol/L   Chloride 96 (L) 98 - 111 mmol/L   CO2 24 22 - 32 mmol/L   Glucose, Bld 329 (H) 70 - 99 mg/dL    Comment: Glucose reference range applies only to samples taken after fasting for at least 8 hours.   BUN 14 8 - 23 mg/dL   Creatinine, Ser 4.09 0.61 - 1.24 mg/dL   Calcium 9.5 8.9 - 81.1 mg/dL   GFR, Estimated >91 >47 mL/min    Comment: (NOTE) Calculated using the CKD-EPI Creatinine Equation (2021)    Anion gap 15 5 - 15    Comment: Performed at [facility], [address], [place], [place] [address]  CBC     Status: Abnormal   Collection Time: 11/02/23 10:58 PM  Result Value Ref Range   WBC 4.5 4.0 - 10.5 K/uL   RBC 4.75 4.22 - 5.81 MIL/uL   Hemoglobin 12.9 (L) 13.0 - 17.0 g/dL   HCT 21.3 (L) 08.6 - 57.8 %   MCV 79.8 (L) 80.0 - 100.0 fL   MCH 27.2 26.0 - 34.0 pg   MCHC 34.0 30.0 - 36.0 g/dL   RDW 46.9 62.9 - 52.8 %   Platelets 181 150 - 400 K/uL   nRBC 0.0 0.0 - 0.2 %    Comment: Performed at [facility], [address] [place], [place] [address]  Troponin I (High Sensitivity)     Status: None   Collection Time: 11/02/23 10:58 PM  Result Value Ref Range   Troponin I (High Sensitivity) 3 <18 ng/L    Comment: (NOTE) Elevated high sensitivity troponin I (hsTnI) values and significant  changes across serial measurements may suggest ACS but many other  chronic and acute conditions are known to elevate hsTnI results.  Refer to the "Links"  section for chest pain algorithms and additional  guidance. Performed at [facility], [address] [place], [place] [address]   Troponin I (High Sensitivity)     Status: None   Collection Time: 11/03/23  1:08 AM  Result Value Ref Range   Troponin I (High Sensitivity) 2 <18 ng/L    Comment: (NOTE) Elevated high sensitivity troponin I (hsTnI) values and significant  changes across serial measurements may suggest ACS but many other  chronic and acute conditions are known to elevate hsTnI results.  Refer to the "Links" section for chest pain algorithms and additional  guidance. Performed at [facility], [address] [place], [place] [address]   Lipid panel     Status: Abnormal   Collection Time: 11/03/23  5:12 AM  Result Value Ref Range   Cholesterol 89 0 - 200 mg/dL   Triglycerides 61 <644 mg/dL   HDL 23 (L) >03 mg/dL   Total CHOL/HDL Ratio 3.9 RATIO   VLDL 12 0 - 40 mg/dL   LDL Cholesterol 54 0 - 99 mg/dL    Comment:        Total Cholesterol/HDL:CHD Risk Coronary Heart Disease Risk Table                     Men   Women  1/2 Average Risk   3.4   3.3  Average Risk       5.0   4.4  2 X Average Risk   9.6   7.1  3 X Average Risk  23.4   11.0        Use the calculated Patient Ratio above and the CHD Risk Table to determine the patient's  CHD Risk.        ATP III CLASSIFICATION (LDL):  <100     mg/dL   Optimal  098-119  mg/dL   Near or Above                    Optimal  130-159  mg/dL   Borderline  147-829  mg/dL   High  >562     mg/dL   Very High Performed at [facility], [address] [place], [place] [address]   Troponin I (High Sensitivity)     Status: None   Collection Time: 11/03/23  5:12 AM  Result Value Ref Range   Troponin I (High Sensitivity) 3 <18 ng/L    Comment: (NOTE) Elevated high sensitivity troponin I (hsTnI) values and significant  changes across serial measurements may suggest ACS but many other  chronic and acute conditions are known to elevate  hsTnI results.  Refer to the "Links" section for chest pain algorithms and additional  guidance. Performed at [facility], [address] [place], [place] [address]   CBG monitoring, ED     Status: Abnormal   Collection Time: 11/03/23  5:29 AM  Result Value Ref Range   Glucose-Capillary 104 (H) 70 - 99 mg/dL    Comment: Glucose reference range applies only to samples taken after fasting for at least 8 hours.   CT Angio Chest Pulmonary Embolism (PE) W or WO Contrast Result Date: 11/03/2023 CLINICAL DATA:  Pulmonary embolus suspected with high probability. Heaviness in the central chest. High blood sugar. EXAM: CT ANGIOGRAPHY CHEST WITH CONTRAST TECHNIQUE: Multidetector CT imaging of the chest was performed using the standard protocol during bolus administration of intravenous contrast. Multiplanar CT image reconstructions and MIPs were obtained to evaluate the vascular anatomy. RADIATION DOSE REDUCTION: This exam was performed according to the departmental dose-optimization program which includes automated exposure control, adjustment of the mA and/or kV according to patient size and/or use of iterative reconstruction technique. CONTRAST:  75mL OMNIPAQUE IOHEXOL 350 MG/ML SOLN COMPARISON:  Chest radiograph 11/02/2023. CT chest abdomen and pelvis 05/04/2023 FINDINGS: Cardiovascular: Technically adequate study with good opacification of the central and segmental pulmonary arteries. Mild motion artifact. No focal filling defects are demonstrated. No evidence of significant pulmonary embolus. Normal heart size. No pericardial effusions. Normal caliber thoracic aorta. No aortic dissection. Scattered aortic calcification. Mild calcification of coronary arteries. Mediastinum/Nodes: Thyroid gland is unremarkable. Esophagus is decompressed. Scattered lymph nodes are not pathologically enlarged. Lungs/Pleura: Small scattered calcified granulomas in the lungs. No significant pulmonary nodules. No airspace disease  or consolidation in the lungs. No pleural effusion or pneumothorax. Upper Abdomen: Surgical absence of the gallbladder. Spleen is enlarged. No acute abnormality seen. Musculoskeletal: Degenerative changes in the spine. No acute bony abnormalities. Review of the MIP images confirms the above findings. IMPRESSION: 1. No evidence of significant pulmonary embolus. 2. No evidence of active pulmonary disease. 3. Splenic enlargement. Electronically Signed   By: Burman Nieves M.D.   On: 11/03/2023 01:24   DG Chest 2 View Result Date: 11/02/2023 CLINICAL DATA:  Chest pain chest x-ray 05/04/2023 EXAM: CHEST - 2 VIEW COMPARISON:  None Available. FINDINGS: The heart size and mediastinal contours are within normal limits. Both lungs are clear. The visualized skeletal structures are unremarkable. IMPRESSION: No active cardiopulmonary disease. Electronically Signed   By: Darliss Cheney M.D.   On: 11/02/2023 23:29    Pending Labs [employer] (From admission, onward)     Start  Ordered   11/10/23 0500  Creatinine, serum  (enoxaparin (LOVENOX)    CrCl >/= 30 ml/min)  Weekly,   R     Comments: while on enoxaparin therapy    11/03/23 0503   11/03/23 0457  HIV Antibody (routine testing w rflx)  (HIV Antibody (Routine testing w reflex) panel)  Once,   R        11/03/23 0503            Vitals/Pain Today's Vitals   11/03/23 0430 11/03/23 0500 11/03/23 0545 11/03/23 0600  BP: 109/83 122/70  111/66  Pulse: 82 81  73  Resp: 11 10  10   Temp:      TempSrc:      SpO2: 98% 98%  94%  Weight:      Height:      PainSc:   7      Isolation Precautions No active isolations  Medications Medications  aspirin chewable tablet 81 mg (has no administration in time range)  oxyCODONE (Oxy IR/ROXICODONE) immediate release tablet 30 mg (30 mg Oral Given 11/03/23 0549)  atorvastatin (LIPITOR) tablet 20 mg (has no administration in time range)  carvedilol (COREG) tablet 12.5 mg (has no administration in time  range)  furosemide (LASIX) tablet 20 mg (has no administration in time range)  isosorbide mononitrate (IMDUR) 24 hr tablet 30 mg (has no administration in time range)  nitroGLYCERIN (NITROSTAT) SL tablet 0.4 mg (has no administration in time range)  zolpidem (AMBIEN) tablet 10 mg (has no administration in time range)  insulin glargine-yfgn (SEMGLEE) injection 20 Units (has no administration in time range)  pantoprazole (PROTONIX) EC tablet 40 mg (has no administration in time range)  famotidine (PEPCID) tablet 20 mg (has no administration in time range)  tamsulosin (FLOMAX) capsule 0.4 mg (has no administration in time range)  cyanocobalamin (VITAMIN B12) tablet 1,000 mcg (has no administration in time range)  insulin aspart protamine - aspart (NOVOLOG 70/30 MIX) FlexPen 52 Units (has no administration in time range)  gabapentin (NEURONTIN) capsule 600 mg (has no administration in time range)  potassium chloride SA (KLOR-CON M) CR tablet 20 mEq (has no administration in time range)  acetaminophen (TYLENOL) tablet 650 mg (has no administration in time range)  ondansetron (ZOFRAN) injection 4 mg (has no administration in time range)  enoxaparin (LOVENOX) injection 40 mg (has no administration in time range)  0.9 %  sodium chloride infusion ( Intravenous New Bag/Given 11/03/23 0536)  ALPRAZolam (XANAX) tablet 0.25 mg (has no administration in time range)  morphine (PF) 2 MG/ML injection 2 mg (has no administration in time range)  insulin aspart (novoLOG) injection 0-9 Units (0 Units Subcutaneous Hold 11/03/23 0532)  sodium chloride 0.9 % bolus 1,000 mL (0 mLs Intravenous Stopped 11/03/23 0134)  morphine (PF) 4 MG/ML injection 4 mg (4 mg Intravenous Given 11/03/23 0016)  iohexol (OMNIPAQUE) 350 MG/ML injection 75 mL (75 mLs Intravenous Contrast Given 11/03/23 0057)    Mobility walks with device about 20 feet (per patient)     Focused Assessments Cardiac Assessment Handoff:  Cardiac Rhythm:  Normal sinus rhythm Lab Results  Component Value Date   CKTOTAL 87 03/08/2012   CKMB 4.8 (H) 03/08/2012   TROPONINI <0.30 05/30/2013   Lab Results  Component Value Date   DDIMER <0.20 03/14/2023   Does the Patient currently have chest pain? No    R Recommendations: See Admitting Provider Note  Report given to:   Additional Notes: AKA on left side. Last surgery  in Jan 2025, Lots of pain in this area, pain somewhat controlled with warm blankets. Patient brought his walker. He is able to use it to walk about 20 feet however has not demonstrated this in the emergency department. Uses urinal without assistance.

## 2023-11-03 NOTE — Progress Notes (Signed)
*  PRELIMINARY RESULTS* Echocardiogram 2D Echocardiogram has been performed with Definity.  Stacey Drain 11/03/2023, 4:32 PM

## 2023-11-03 NOTE — ED Notes (Signed)
 Was told by night RN that report was attempted but was asked to call back as it was the middle of shift change. Going to check pt glucose then calling back to give report.

## 2023-11-03 NOTE — Assessment & Plan Note (Signed)
 -  We will continue Flomax

## 2023-11-03 NOTE — Discharge Instructions (Signed)
 1)Very Low-salt diet advised---Less than 2 gm of Sodium per day advised----ok to use Mrs DASH salt substitute instead of Salt 2)Weigh yourself daily, call if you gain more than 3 pounds in 1 day or more than 5 pounds in 1 week as your diuretic medications may need to be adjusted 3)Avoid ibuprofen/Advil/Aleve/Motrin/Goody Powders/Naproxen/BC powders/Meloxicam/Diclofenac/Indomethacin and other Nonsteroidal anti-inflammatory medications as these will make you more likely to bleed and can cause stomach ulcers, can also cause Kidney problems.  4)Please decrease the isosorbide/Imdur to one-half a tablet daily 5)Please reduce Coreg/carvedilol to 3.125 mg twice daily (from 12.5 mg )

## 2023-11-03 NOTE — Assessment & Plan Note (Signed)
 -  We will continue statin therapy.

## 2023-11-03 NOTE — Assessment & Plan Note (Signed)
-   Will need antihypertensive therapy.

## 2023-11-03 NOTE — Consult Note (Signed)
 Cardiology Consultation   Patient ID: Mike Floyd MRN: [id number]; DOB: 06/16/1961  Admit date: 11/02/2023 Date of Consult: 11/03/2023  PCP:  Mike Nevins, MD    [facility] Providers Cardiologist:  Mike Haws, MD  Electrophysiologist:  Mike Bunting, MD       Patient Profile:   Mike Floyd is a 63 y.o. male with a hx of CAD (normal cors by cath in 2009, prior Coronary CT in 01/2019 showed mild disease along mid-LAD and proximal LCx, low-risk NST in 10/2020, Coronary CTA in 05/2023 with only minimal 0-24% scattered stenosis), HFimpEF (EF 40-45% in 01/2019 and at 50-55% by echo in 05/2022), history of ICD (placed in 2009 but removed in 2018), WPW (s/p RFA in 1999), HTN, HLD, Stage 2 CKD and prior CVA who is being seen 11/03/2023 for the evaluation of chest pain at the request of Mike Floyd.  History of Present Illness:   Mike Floyd was examined in clinic by myself in 05/2023 and reported still having intermittent episodes of chest pain at rest which would spontaneously resolve. Recent Coronary CTA earlier that month had shown minimal nonobstructive CAD with 0 to 24% stenosis and further cardiac testing was not pursued. Was recommended to potentially obtain PFT's and also to consider GI evaluation as he did report a history of esophageal dysmotility/stricture.  In the interim, he was hospitalized for osteomyelitis of his left foot in 06/2023 and required a left BKA. Was again admitted in 07/2023 for revision of this and wound VAC placement given dehiscence. Ultimately required above-knee amputation in 08/2023.  He presented to [facility] ED yesterday evening for evaluation of chest pain. In talking with the patient today, he reports his activity has been limited since undergoing AKA in 08/2023. He is using a walker for ambulation and is hoping to obtain a prosthesis soon. He does ambulate around his home with his walker. He denies any recent exertional chest pain or dyspnea on  exertion. Yesterday evening, he developed a chest discomfort while watching television. Reports this was a significant pressure along his sternal/right pectoral region and felt like a lead jacket was sitting on his chest. Reports was hard to breathe at that time and his chest was tender to palpation. Pain was not worse with positional changes or exertion. His pain lasted for over 6 hours and was not relieved with nitroglycerin.  Does not feel like this was related to food consumption as he ate supper 3 hours prior to the pain starting. No recent orthopnea, PND or pitting edema. Reports he has been having intermittent hypotension when BP has been checked at home by home health as well but no changes have been made to his blood pressure medications. BP was as low as 85/62 yesterday evening and this was at 109/81 by most recent check. Says that he has lost weight given his multiple operations but is unsure of his current weight.  Initial labs showed WBC 4.5, Hgb 12.9, platelets 181, Na+ 135, K+ 3.7 and creatinine 1.22. Initial and repeat Hs troponin values negative at 2, 3 and 2. FLP showed total cholesterol at 89 and LDL at 54. CXR with no active cardiopulmonary disease. CTA showed no evidence of a PE. EKG showed sinus tachycardia, heart rate 106 with baseline artifact and diffuse up-sloping of the ST segments but overall similar to prior tracings.   Past Medical History:  Diagnosis Date   AICD (automatic cardioverter/defibrillator) present 11/2007   Removed in 2018, a. 11/2007 [facility] Current VR -  single lead ICD  - Removed 2018 - "it was burning me"   Anxiety    CAD (coronary artery disease)    non-obstructive CAD by Cor CT in 2020 // Myoview 3/22: EF 57, small inf-sept defect c/w scar, no ischemia; low risk      Chest pain    a. 10/2007 Cath:  normal Cors.   CKD (chronic kidney disease), stage II    DDD (degenerative disc disease), lumbar    Diabetes mellitus DX: 2010   type 2   Erosive esophagitis     a. per EGD (08/2011), Dr. Karilyn Cota - Erosive reflux esophagitis improved but not completely healed since previous EGD 3 years ago. Bx showing  ulcerated gatroesophageal junction mucosa. negative for H. pylori   GERD (gastroesophageal reflux disease)    Gout    Hearing deficit    a. wear bilateral hearing aides   History of hiatal hernia    History of kidney stones    passed stones, no surgery   Hypertension    Mildly dilatd aortic root (HCC)    CMR 4/22: EF 52, no LGE; d/w Dr. Lorretta Harp root 38 mm (mildly dilated)   Myocardial infarction [facility]) 2011   Neuropathy    Feet and legs   Nonischemic dilated cardiomyopathy (HCC)    a. H/O EF as low as 35-40% by LV gram 10/2007;  b. Echo 02/2011 EF 50-55%, inf HK, Gr 1 DD // CMR 4/22: EF 52, no LGE; d/w Dr. Lorretta Harp root 38 mm (mildly dilated)    Renal insufficiency    Sleep apnea    pt doesnt use, states"I cant afford one". PCP aware   Stroke (HCC)    mini-stroke in 2014   TIA (transient ischemic attack)    July, 2013   Tobacco abuse, in remission 06/27/2009   Discontinued in 2009     Wears dentures    top plate   WPW (Wolff-Parkinson-White syndrome)    a. s/p RFCA @ [facility] - 1999    Past Surgical History:  Procedure Laterality Date   AMPUTATION Left 05/19/2022   Procedure: TRANSMETATARSAL AMPUTATION LEFT FOOT;  Surgeon: Nadara Mustard, MD;  Location: [facility] OR;  Service: Orthopedics;  Laterality: Left;   AMPUTATION Left 06/17/2023   Procedure: LEFT BELOW KNEE AMPUTATION;  Surgeon: Nadara Mustard, MD;  Location: [facility] OR;  Service: Orthopedics;  Laterality: Left;   AMPUTATION Left 08/19/2023   Procedure: LEFT ABOVE KNEE AMPUTATION;  Surgeon: Nadara Mustard, MD;  Location: [facility] OR;  Service: Orthopedics;  Laterality: Left;   APPENDECTOMY     BACK SURGERY     1995   CARDIAC CATHETERIZATION     2009   CARDIAC DEFIBRILLATOR PLACEMENT     ICD was removed in 2018   CHOLECYSTECTOMY     CHOLECYSTECTOMY, LAPAROSCOPIC     11/2007   COLONOSCOPY W/  POLYPECTOMY  2009   ELBOW SURGERY Left 06/2010   ESOPHAGEAL DILATION N/A 11/08/2014   Procedure: ESOPHAGEAL DILATION;  Surgeon: Malissa Hippo, MD;  Location: [facility] ORS;  Service: Endoscopy;  Laterality: N/A;  #56,    ESOPHAGOGASTRODUODENOSCOPY  03/31/2012   also 08/2011; [name]   ESOPHAGOGASTRODUODENOSCOPY (EGD) WITH PROPOFOL N/A 11/08/2014   Procedure: ESOPHAGOGASTRODUODENOSCOPY (EGD) WITH PROPOFOL;  Surgeon: Malissa Hippo, MD;  Location: [facility] ORS;  Service: Endoscopy;  Laterality: N/A;  [place] is 44 , [place] is 35   FOOT ARTHRODESIS Left 10/24/2020   Procedure: LEFT GASTROCNEMIUS RECESSION, DORSIFLEXION OSTEOTOMY 1ST MT;  Surgeon: Lajoyce Corners,  Randa Evens, MD;  Location: [facility] OR;  Service: Orthopedics;  Laterality: Left;   FOOT ARTHRODESIS Right 01/09/2021   Procedure: CLOSING WEDGE OSTEOTOMY RIGHT 1ST METATARSAL;  Surgeon: Nadara Mustard, MD;  Location: [facility] OR;  Service: Orthopedics;  Laterality: Right;   GASTROCNEMIUS RECESSION Right 01/09/2021   Procedure: RIGHT GASTROCNEMIUS RECESSION;  Surgeon: Nadara Mustard, MD;  Location: [facility] OR;  Service: Orthopedics;  Laterality: Right;   ICD LEAD REMOVAL N/A 10/07/2016   Procedure: ICD LEAD REMOVAL ;  Surgeon: Marinus Maw, MD;  Location: [facility] OR;  Service: Cardiovascular;  Laterality: N/A;   MULTIPLE TOOTH EXTRACTIONS     RADIOFREQUENCY ABLATION  1999   WPW; performed at [facility]   STUMP REVISION Left 07/22/2023   Procedure: REVISION LEFT BELOW KNEE AMPUTATION;  Surgeon: Nadara Mustard, MD;  Location: [facility] OR;  Service: Orthopedics;  Laterality: Left;   TEE WITHOUT CARDIOVERSION N/A 10/07/2016   Procedure: TRANSESOPHAGEAL ECHOCARDIOGRAM (TEE);  Surgeon: Marinus Maw, MD;  Location: [facility] OR;  Service: Cardiovascular;  Laterality: N/A;     Home Medications:  Prior to Admission medications   Medication Sig Start Date End Date Taking? Authorizing Provider  aspirin 81 MG tablet Take 81 mg by mouth daily.    [provider]  atorvastatin (LIPITOR)  20 MG tablet Take 20 mg by mouth in the morning.    [provider]  carvedilol (COREG) 12.5 MG tablet TAKE 1 TABLET BY MOUTH TWICE DAILY. 03/18/22   Wendall Stade, MD  doxycycline (VIBRA-TABS) 100 MG tablet Take 1 tablet (100 mg total) by mouth 2 (two) times daily. 10/03/23   Nadara Mustard, MD  famotidine (PEPCID) 20 MG tablet Take 20 mg by mouth daily.    [provider]  furosemide (LASIX) 20 MG tablet Take 1 tablet (20 mg total) by mouth daily. 03/16/23   Tamre Cass, Lennart Pall, PA-C  gabapentin (NEURONTIN) 600 MG tablet Take 600 mg by mouth 3 (three) times daily. 09/05/20   [provider]  insulin glargine (LANTUS) 100 UNIT/ML injection Inject 20 Units into the skin at bedtime.    [provider]  isosorbide mononitrate (IMDUR) 30 MG 24 hr tablet Take 1 tablet (30 mg total) by mouth daily. 05/27/23 08/25/23  Bird Tailor, Lennart Pall, PA-C  nitroGLYCERIN (NITROSTAT) 0.4 MG SL tablet Place 1 tablet (0.4 mg total) under the tongue every 5 (five) minutes as needed for chest pain. 03/11/23   Keina Mutch, Lennart Pall, PA-C  NOVOLOG MIX 70/30 FLEXPEN (70-30) 100 UNIT/ML FlexPen Inject 52 Units into the skin 2 (two) times daily. 03/05/22   [provider]  oxycodone (ROXICODONE) 30 MG immediate release tablet Take 1 tablet (30 mg total) by mouth every 8 (eight) hours as needed for pain. 08/24/23   Nadara Mustard, MD  oxyCODONE-acetaminophen (PERCOCET/ROXICET) 5-325 MG tablet Take 1 tablet by mouth every 4 (four) hours as needed. 08/24/23   Nadara Mustard, MD  pantoprazole (PROTONIX) 40 MG tablet Take 40 mg by mouth daily. 07/29/21   [provider]  potassium chloride SA (KLOR-CON M) 20 MEQ tablet TAKE (1) TABLET BY MOUTH ONCE DAILY. 07/04/23   Wendall Stade, MD  sucralfate (CARAFATE) 1 g tablet Take 1 tablet (1 g total) by mouth with breakfast, with lunch, and with evening meal for 7 days. Patient not taking: Reported on 07/22/2023 05/04/23 06/17/23  Tanda Rockers A, DO   tamsulosin (FLOMAX) 0.4 MG CAPS capsule Take 0.4 mg by mouth daily. am  [provider]  vitamin B-12 (CYANOCOBALAMIN) 1000 MCG tablet Take 1,000 mcg by mouth daily.    [provider]  zolpidem (AMBIEN) 10 MG tablet Take 10 mg by mouth at bedtime.    [provider]    Inpatient Medications: Scheduled Meds:  aspirin  81 mg Oral Daily   atorvastatin  20 mg Oral q AM   carvedilol  12.5 mg Oral BID   cyanocobalamin  1,000 mcg Oral Daily   enoxaparin (LOVENOX) injection  40 mg Subcutaneous Q24H   famotidine  20 mg Oral Daily   furosemide  20 mg Oral Daily   gabapentin  600 mg Oral TID   insulin aspart  0-9 Units Subcutaneous Q4H   insulin aspart protamine - aspart  52 Units Subcutaneous BID   insulin glargine-yfgn  20 Units Subcutaneous QHS   isosorbide mononitrate  30 mg Oral Daily   pantoprazole  40 mg Oral Daily   potassium chloride SA  20 mEq Oral Daily   tamsulosin  0.4 mg Oral Daily   zolpidem  10 mg Oral QHS   Continuous Infusions:  sodium chloride 100 mL/hr at 11/03/23 0536   PRN Meds: acetaminophen, ALPRAZolam, morphine injection, nitroGLYCERIN, ondansetron (ZOFRAN) IV, oxycodone  Allergies:   No Known Allergies  Social History:   Social History   Socioeconomic History   Marital status: Married    Spouse name: Not on file   Number of children: Not on file   Years of education: Not on file   Highest education level: Not on file  Occupational History   Occupation: Worked for [employer]    Employer: [employer]    Comment: Laid off in 11/11  Tobacco Use   Smoking status: Former    Current packs/day: 0.00    Average packs/day: 1.5 packs/day for 39.1 years (58.6 ttl pk-yrs)    Types: Cigarettes    Start date: 06/30/1971    Quit date: 07/30/2010    Years since quitting: 13.2   Smokeless tobacco: Former    Types: Chew    Quit date: 03/26/1994  Vaping Use   Vaping status: Former   Start date: 07/30/2010   Quit date: 06/29/2016   Substance and Sexual Activity   Alcohol use: No    Alcohol/week: 1.0 standard drink of alcohol    Types: 1 Cans of beer per week    Comment: used "years ago"   Drug use: No   Sexual activity: Yes    Birth control/protection: None  Other Topics Concern   Not on file  Social History Narrative   Not on file   Social Drivers of Health   Financial Resource Strain: Not on file  Food Insecurity: No Food Insecurity (08/19/2023)   Hunger Vital Sign    Worried About Running Out of Food in the Last Year: Never true    Ran Out of Food in the Last Year: Never true  Transportation Needs: No Transportation Needs (08/19/2023)   PRAPARE - Administrator, Civil Service (Medical): No    Lack of Transportation (Non-Medical): No  Physical Activity: Not on file  Stress: Not on file  Social Connections: Not on file  Intimate Partner Violence: Not At Risk (08/19/2023)   Humiliation, Afraid, Rape, and Kick questionnaire    Fear of Current or Ex-Partner: No    Emotionally Abused: No    Physically Abused: No    Sexually Abused: No    Family History:    Family History  Problem Relation  Age of Onset   Heart attack Mother 51   Hypertension Mother    Diabetes Mother    Kidney disease Mother    Breast cancer Mother    Heart attack Father 49   Hypertension Father    Diabetes Father    Stroke Brother    Heart attack Brother 13   Stroke Maternal Grandmother 71   Diabetes Brother    Hypertension Brother      ROS:  Please see the history of present illness.   All other ROS reviewed and negative.     Physical Exam/Data:   Vitals:   11/03/23 0430 11/03/23 0500 11/03/23 0600 11/03/23 0733  BP: 109/83 122/70 111/66 109/81  Pulse: 82 81 73 80  Resp: 11 10 10 16   Temp:    97.9 F (36.6 C)  TempSrc:    Oral  SpO2: 98% 98% 94% 98%  Weight:      Height:        Intake/Output Summary (Last 24 hours) at 11/03/2023 0757 Last data filed at 11/03/2023 0650 Gross per 24 hour  Intake  1000 ml  Output 425 ml  Net 575 ml      11/02/2023   10:47 PM 08/18/2023    9:59 AM 07/22/2023    2:03 PM  Last 3 Weights  Weight (lbs) 195 lb 192 lb 14.4 oz 201 lb 15.1 oz  Weight (kg) 88.451 kg 87.5 kg 91.6 kg     Body mass index is 25.73 kg/m.  General:  Pleasant elderly male appearing in no acute distress.  HEENT: normal Neck: no JVD Vascular: No carotid bruits; Distal pulses 2+ bilaterally Cardiac:  normal S1, S2; RRR; no murmur  Lungs:  clear to auscultation bilaterally, no wheezing, rhonchi or rales  Abd: soft, nontender, no hepatomegaly  Ext: no pitting edema Musculoskeletal:  Left AKA.  Skin: warm and dry  Neuro:  CNs 2-12 intact, no focal abnormalities noted Psych:  Normal affect   EKG:  The EKG was personally reviewed and demonstrates:  Sinus tachycardia, heart rate 106 with baseline artifact and diffuse up-sloping of the ST segments but overall similar to prior tracings.  Telemetry: Not currently on telemetry.   Relevant CV Studies:  Echocardiogram: 05/2022 IMPRESSIONS     1. Left ventricular ejection fraction, by estimation, is 50 to 55%. The  left ventricle has low normal function. The left ventricle has no regional  wall motion abnormalities. Left ventricular diastolic parameters are  consistent with Grade I diastolic  dysfunction (impaired relaxation).   2. Right ventricular systolic function is normal. The right ventricular  size is normal. Tricuspid regurgitation signal is inadequate for assessing  PA pressure.   3. The mitral valve is normal in structure. No evidence of mitral valve  regurgitation.   4. The aortic valve is tricuspid. Aortic valve regurgitation is not  visualized. No aortic stenosis is present.   5. Aortic dilatation noted. There is borderline dilatation of the aortic  root, measuring 39 mm   Coronary CTA: 05/2023 MPRESSION: 1. Coronary calcium score of 119. This was 63rd percentile for age and sex matched control.   2. Total  plaque volume 158mm3 which is 30th percentile for age and sex-matched controls (calcified plaque 94mm3; noncalcified plaque 148mm3). TPV is moderate   3.  Normal coronary origin with right dominance.   4.  Nonobstructive CAD   5. Minimal (0-24%) stenosis in left main, proximal/mid LAD, and proximal LCX   6.  Dilated aortic root measuring 41mm  CAD-RADS 1. Minimal non-obstructive CAD (0-24%). Consider non-atherosclerotic causes of chest pain. Consider preventive therapy and risk factor modification.  Laboratory Data:  High Sensitivity Troponin:   Recent Labs  Lab 11/02/23 2258 11/03/23 0108 11/03/23 0512 11/03/23 0713  TROPONINIHS 3 2 3 2      Chemistry Recent Labs  Lab 11/02/23 2258  NA 135  K 3.7  CL 96*  CO2 24  GLUCOSE 329*  BUN 14  CREATININE 1.22  CALCIUM 9.5  GFRNONAA >60  ANIONGAP 15    No results for input(s): "PROT", "ALBUMIN", "AST", "ALT", "ALKPHOS", "BILITOT" in the last 168 hours. Lipids  Recent Labs  Lab 11/03/23 0512  CHOL 89  TRIG 61  HDL 23*  LDLCALC 54  CHOLHDL 3.9    Hematology Recent Labs  Lab 11/02/23 2258  WBC 4.5  RBC 4.75  HGB 12.9*  HCT 37.9*  MCV 79.8*  MCH 27.2  MCHC 34.0  RDW 14.6  PLT 181   Thyroid No results for input(s): "TSH", "FREET4" in the last 168 hours.  BNPNo results for input(s): "BNP", "PROBNP" in the last 168 hours.  DDimer No results for input(s): "DDIMER" in the last 168 hours.   Radiology/Studies:  CT Angio Chest Pulmonary Embolism (PE) W or WO Contrast Result Date: 11/03/2023 CLINICAL DATA:  Pulmonary embolus suspected with high probability. Heaviness in the central chest. High blood sugar. EXAM: CT ANGIOGRAPHY CHEST WITH CONTRAST TECHNIQUE: Multidetector CT imaging of the chest was performed using the standard protocol during bolus administration of intravenous contrast. Multiplanar CT image reconstructions and MIPs were obtained to evaluate the vascular anatomy. RADIATION DOSE REDUCTION: This  exam was performed according to the departmental dose-optimization program which includes automated exposure control, adjustment of the mA and/or kV according to patient size and/or use of iterative reconstruction technique. CONTRAST:  75mL OMNIPAQUE IOHEXOL 350 MG/ML SOLN COMPARISON:  Chest radiograph 11/02/2023. CT chest abdomen and pelvis 05/04/2023 FINDINGS: Cardiovascular: Technically adequate study with good opacification of the central and segmental pulmonary arteries. Mild motion artifact. No focal filling defects are demonstrated. No evidence of significant pulmonary embolus. Normal heart size. No pericardial effusions. Normal caliber thoracic aorta. No aortic dissection. Scattered aortic calcification. Mild calcification of coronary arteries. Mediastinum/Nodes: Thyroid gland is unremarkable. Esophagus is decompressed. Scattered lymph nodes are not pathologically enlarged. Lungs/Pleura: Small scattered calcified granulomas in the lungs. No significant pulmonary nodules. No airspace disease or consolidation in the lungs. No pleural effusion or pneumothorax. Upper Abdomen: Surgical absence of the gallbladder. Spleen is enlarged. No acute abnormality seen. Musculoskeletal: Degenerative changes in the spine. No acute bony abnormalities. Review of the MIP images confirms the above findings. IMPRESSION: 1. No evidence of significant pulmonary embolus. 2. No evidence of active pulmonary disease. 3. Splenic enlargement. Electronically Signed   By: Burman Nieves M.D.   On: 11/03/2023 01:24   DG Chest 2 View Result Date: 11/02/2023 CLINICAL DATA:  Chest pain chest x-ray 05/04/2023 EXAM: CHEST - 2 VIEW COMPARISON:  None Available. FINDINGS: The heart size and mediastinal contours are within normal limits. Both lungs are clear. The visualized skeletal structures are unremarkable. IMPRESSION: No active cardiopulmonary disease. Electronically Signed   By: Darliss Cheney M.D.   On: 11/02/2023 23:29     Assessment  and Plan:   1. Chest Pain with Atypical Features - His recent episode of chest pain seems atypical for a cardiac etiology as it occurred at rest and lasted for over 6 hours  Reports his chest was tender to palpation during  that timeframe as well. Hs values have been negative and CTA was negative for PE. Will obtain an echocardiogram to assess for any structural abnormalities given his history of NICM but would not anticipate further cardiac evaluation beyond this given recent Coronary CTA within the past 6 months only showed minimal CAD.  - Will defer further workup of other etiologies to the admitting team. As discussed during prior office visits, he does have a history of esophageal dysmotility/stricture but his current episode of chest pain was not associated with food consumption. Remains on Protonix 40 mg daily. Could arrange for re-evaluation by GI as an outpatient.   2. CAD/HLD - He had normal coronary arteries by cardiac catheterization 2009 and Coronary CT in 01/2019 showed mild disease. Most recent Coronary CTA in 05/2023 showed minimal CAD as outlined above with 0 to 24% scattered stenosis. - Hs troponin values have been negative this admission and EKG without acute ST changes. Will plan for a follow-up echocardiogram as discussed above. Continue ASA 81 mg daily and Atorvastatin 20 mg daily. LDL was at 54 when rechecked this admission.  3. History of Cardiomyopathy - He has a history of NICM and previously had an ICD but this was removed in 2018. EF was at 50 to 55% by echocardiogram in 05/2022. Will plan for a follow-up echocardiogram for reassessment. Will reduce Coreg as outlined below given hypotension in the setting of weight loss.  4. Osteomyelitis - This was a recurrent issue for the patient and he required BKA in 06/2023 and ultimately underwent AKA in 08/2023. Being followed by Orthopedics as an outpatient.  5. HTN - He has actually been hypotensive this admission and reports BP  was soft when checked at home as well. No recent dizziness or presyncope. Suspect this is due to weight loss over the past several months and no reduction of medical therapy during that timeframe. He was taking Coreg 12.5 mg twice daily, Lasix 20 mg daily and Imdur 30 mg daily prior to admission. Will reduce Coreg to 3.125 mg twice daily with hold parameters in place. Will ask for a standing weight to be obtained so he is able to know his baseline weight.   For questions or updates, please contact [facility] Please consult [email or web address] for contact info under    Signed, Ellsworth Lennox, PA-C  11/03/2023 7:57 AM

## 2023-11-03 NOTE — H&P (Addendum)
 [place]   PATIENT NAME: Mike Floyd    MR#:  [id number]  DATE OF BIRTH:  1961/06/07  DATE OF ADMISSION:  11/02/2023  PRIMARY CARE PHYSICIAN: Elfredia Nevins, MD   Patient is coming from: Home  REQUESTING/REFERRING PHYSICIAN: Kennis Carina, MD  CHIEF COMPLAINT:   Chief Complaint  Patient presents with   Chest Pain    HISTORY OF PRESENT ILLNESS:  Mike Floyd is a 63 y.o. Caucasian male with medical history significant for coronary artery disease, stage III chronic kidney disease, type 2 diabetes mellitus, GERD, gout, and hypertension, as well as peripheral neuropathy, who presented to the emergency room with acute onset of right parasternal chest pain felt as pressure and graded 7/10 in severity with associated dyspnea without palpitations.  Did not any nausea or vomiting or diaphoresis.  No radiation to his pain.  He denied any cough or wheezing or hemoptysis.  No leg pain or edema.  He has had left lower extremity amputations in November, December and January and the last was above-knee amputation.  No dysuria, oliguria or hematuria or flank pain.  No other bleeding diathesis.  ED Course: Upon presentation to the ER, BP was 90/63 and later 103/62 with otherwise normal vital signs.  Labs revealed hyperglycemia of 329 with chloride of 96 and otherwise unremarkable BMP.  High-sensitivity troponin I was 3 and later 2.  CBC showed anemia with hemoglobin of 12.9 and hematocrit 37.9 with microcytosis. EKG as reviewed by me : EKG showed sinus tachycardia with rate of 106. Imaging: Two-view chest x-ray showed no acute cardiopulmonary disease. Chest CTA revealed:cno evidence for PE or active pulmonary disease.  It showed splenic enlargement.  The patient was given 4 mg of IV morphine sulfate and 1 L bolus of IV normal saline.  He will be admitted to an observation telemetry bed for further evaluation and management. PAST MEDICAL HISTORY:   Past Medical History:  Diagnosis Date    AICD (automatic cardioverter/defibrillator) present 11/2007   Removed in 2018, a. 11/2007 [facility] Current VR - single lead ICD  - Removed 2018 - "it was burning me"   Anxiety    CAD (coronary artery disease)    non-obstructive CAD by Cor CT in 2020 // Myoview 3/22: EF 57, small inf-sept defect c/w scar, no ischemia; low risk      Chest pain    a. 10/2007 Cath:  normal Cors.   CKD (chronic kidney disease), stage II    DDD (degenerative disc disease), lumbar    Diabetes mellitus DX: 2010   type 2   Erosive esophagitis    a. per EGD (08/2011), Dr. Karilyn Cota - Erosive reflux esophagitis improved but not completely healed since previous EGD 3 years ago. Bx showing  ulcerated gatroesophageal junction mucosa. negative for H. pylori   GERD (gastroesophageal reflux disease)    Gout    Hearing deficit    a. wear bilateral hearing aides   History of hiatal hernia    History of kidney stones    passed stones, no surgery   Hypertension    Mildly dilatd aortic root (HCC)    CMR 4/22: EF 52, no LGE; d/w Dr. Lorretta Harp root 38 mm (mildly dilated)   Myocardial infarction [facility]) 2011   Neuropathy    Feet and legs   Nonischemic dilated cardiomyopathy (HCC)    a. H/O EF as low as 35-40% by LV gram 10/2007;  b. Echo 02/2011 EF 50-55%, inf HK, Gr 1 DD //  CMR 4/22: EF 52, no LGE; d/w Dr. Lorretta Harp root 38 mm (mildly dilated)    Renal insufficiency    Sleep apnea    pt doesnt use, states"I cant afford one". PCP aware   Stroke (HCC)    mini-stroke in 2014   TIA (transient ischemic attack)    July, 2013   Tobacco abuse, in remission 06/27/2009   Discontinued in 2009     Wears dentures    top plate   WPW (Wolff-Parkinson-White syndrome)    a. s/p RFCA @ [facility] - 1999    PAST SURGICAL HISTORY:   Past Surgical History:  Procedure Laterality Date   AMPUTATION Left 05/19/2022   Procedure: TRANSMETATARSAL AMPUTATION LEFT FOOT;  Surgeon: Nadara Mustard, MD;  Location: [facility] OR;  Service: Orthopedics;   Laterality: Left;   AMPUTATION Left 06/17/2023   Procedure: LEFT BELOW KNEE AMPUTATION;  Surgeon: Nadara Mustard, MD;  Location: [facility] OR;  Service: Orthopedics;  Laterality: Left;   AMPUTATION Left 08/19/2023   Procedure: LEFT ABOVE KNEE AMPUTATION;  Surgeon: Nadara Mustard, MD;  Location: [facility] OR;  Service: Orthopedics;  Laterality: Left;   APPENDECTOMY     BACK SURGERY     1995   CARDIAC CATHETERIZATION     2009   CARDIAC DEFIBRILLATOR PLACEMENT     ICD was removed in 2018   CHOLECYSTECTOMY     CHOLECYSTECTOMY, LAPAROSCOPIC     11/2007   COLONOSCOPY W/ POLYPECTOMY  2009   ELBOW SURGERY Left 06/2010   ESOPHAGEAL DILATION N/A 11/08/2014   Procedure: ESOPHAGEAL DILATION;  Surgeon: Malissa Hippo, MD;  Location: [facility] ORS;  Service: Endoscopy;  Laterality: N/A;  #56,    ESOPHAGOGASTRODUODENOSCOPY  03/31/2012   also 08/2011; [name]   ESOPHAGOGASTRODUODENOSCOPY (EGD) WITH PROPOFOL N/A 11/08/2014   Procedure: ESOPHAGOGASTRODUODENOSCOPY (EGD) WITH PROPOFOL;  Surgeon: Malissa Hippo, MD;  Location: [facility] ORS;  Service: Endoscopy;  Laterality: N/A;  [place] is 9 , GE [place]unction is 37   FOOT ARTHRODESIS Left 10/24/2020   Procedure: LEFT GASTROCNEMIUS RECESSION, DORSIFLEXION OSTEOTOMY 1ST MT;  Surgeon: Nadara Mustard, MD;  Location: [facility] OR;  Service: Orthopedics;  Laterality: Left;   FOOT ARTHRODESIS Right 01/09/2021   Procedure: CLOSING WEDGE OSTEOTOMY RIGHT 1ST METATARSAL;  Surgeon: Nadara Mustard, MD;  Location: [facility] OR;  Service: Orthopedics;  Laterality: Right;   GASTROCNEMIUS RECESSION Right 01/09/2021   Procedure: RIGHT GASTROCNEMIUS RECESSION;  Surgeon: Nadara Mustard, MD;  Location: [facility] OR;  Service: Orthopedics;  Laterality: Right;   ICD LEAD REMOVAL N/A 10/07/2016   Procedure: ICD LEAD REMOVAL ;  Surgeon: Marinus Maw, MD;  Location: [facility] OR;  Service: Cardiovascular;  Laterality: N/A;   MULTIPLE TOOTH EXTRACTIONS     RADIOFREQUENCY ABLATION  1999   WPW; performed at [facility]   STUMP REVISION  Left 07/22/2023   Procedure: REVISION LEFT BELOW KNEE AMPUTATION;  Surgeon: Nadara Mustard, MD;  Location: [facility] OR;  Service: Orthopedics;  Laterality: Left;   TEE WITHOUT CARDIOVERSION N/A 10/07/2016   Procedure: TRANSESOPHAGEAL ECHOCARDIOGRAM (TEE);  Surgeon: Marinus Maw, MD;  Location: [facility] OR;  Service: Cardiovascular;  Laterality: N/A;    SOCIAL HISTORY:   Social History   Tobacco Use   Smoking status: Former    Current packs/day: 0.00    Average packs/day: 1.5 packs/day for 39.1 years (58.6 ttl pk-yrs)    Types: Cigarettes    Start date: 06/30/1971    Quit date: 07/30/2010    Years since quitting:  13.2   Smokeless tobacco: Former    Types: Chew    Quit date: 03/26/1994  Substance Use Topics   Alcohol use: No    Alcohol/week: 1.0 standard drink of alcohol    Types: 1 Cans of beer per week    Comment: used "years ago"    FAMILY HISTORY:   Family History  Problem Relation Age of Onset   Heart attack Mother 20   Hypertension Mother    Diabetes Mother    Kidney disease Mother    Breast cancer Mother    Heart attack Father 79   Hypertension Father    Diabetes Father    Stroke Brother    Heart attack Brother 4   Stroke Maternal Grandmother 33   Diabetes Brother    Hypertension Brother     DRUG ALLERGIES:  No Known Allergies  REVIEW OF SYSTEMS:   ROS As per history of present illness. All pertinent systems were reviewed above. Constitutional, HEENT, cardiovascular, respiratory, GI, GU, musculoskeletal, neuro, psychiatric, endocrine, integumentary and hematologic systems were reviewed and are otherwise negative/unremarkable except for positive findings mentioned above in the HPI.   MEDICATIONS AT HOME:   Prior to Admission medications   Medication Sig Start Date End Date Taking? Authorizing Provider  aspirin 81 MG tablet Take 81 mg by mouth daily.    [provider]  atorvastatin (LIPITOR) 20 MG tablet Take 20 mg by mouth in the morning.    [provider]  carvedilol (COREG) 12.5 MG tablet TAKE 1 TABLET BY MOUTH TWICE DAILY. 03/18/22   Wendall Stade, MD  doxycycline (VIBRA-TABS) 100 MG tablet Take 1 tablet (100 mg total) by mouth 2 (two) times daily. 10/03/23   Nadara Mustard, MD  famotidine (PEPCID) 20 MG tablet Take 20 mg by mouth daily.    [provider]  furosemide (LASIX) 20 MG tablet Take 1 tablet (20 mg total) by mouth daily. 03/16/23   Strader, Lennart Pall, PA-C  gabapentin (NEURONTIN) 600 MG tablet Take 600 mg by mouth 3 (three) times daily. 09/05/20   [provider]  insulin glargine (LANTUS) 100 UNIT/ML injection Inject 20 Units into the skin at bedtime.    [provider]  isosorbide mononitrate (IMDUR) 30 MG 24 hr tablet Take 1 tablet (30 mg total) by mouth daily. 05/27/23 08/25/23  Strader, Lennart Pall, PA-C  nitroGLYCERIN (NITROSTAT) 0.4 MG SL tablet Place 1 tablet (0.4 mg total) under the tongue every 5 (five) minutes as needed for chest pain. 03/11/23   Strader, Lennart Pall, PA-C  NOVOLOG MIX 70/30 FLEXPEN (70-30) 100 UNIT/ML FlexPen Inject 52 Units into the skin 2 (two) times daily. 03/05/22   [provider]  oxycodone (ROXICODONE) 30 MG immediate release tablet Take 1 tablet (30 mg total) by mouth every 8 (eight) hours as needed for pain. 08/24/23   Nadara Mustard, MD  oxyCODONE-acetaminophen (PERCOCET/ROXICET) 5-325 MG tablet Take 1 tablet by mouth every 4 (four) hours as needed. 08/24/23   Nadara Mustard, MD  pantoprazole (PROTONIX) 40 MG tablet Take 40 mg by mouth daily. 07/29/21   [provider]  potassium chloride SA (KLOR-CON M) 20 MEQ tablet TAKE (1) TABLET BY MOUTH ONCE DAILY. 07/04/23   Wendall Stade, MD  sucralfate (CARAFATE) 1 g tablet Take 1 tablet (1 g total) by mouth with breakfast, with lunch, and with evening meal for 7 days. Patient not taking: Reported on 07/22/2023 05/04/23 06/17/23  Tanda Rockers A, DO  tamsulosin (  FLOMAX) 0.4 MG CAPS capsule Take 0.4 mg by mouth  daily. am    [provider]  vitamin B-12 (CYANOCOBALAMIN) 1000 MCG tablet Take 1,000 mcg by mouth daily.    [provider]  zolpidem (AMBIEN) 10 MG tablet Take 10 mg by mouth at bedtime.    [provider]      VITAL SIGNS:  Blood pressure 122/70, pulse 81, temperature 98.1 F (36.7 C), temperature source Oral, resp. rate 10, height 6\' 1"  (1.854 m), weight 88.5 kg, SpO2 98%.  PHYSICAL EXAMINATION:  Physical Exam  GENERAL:  63 y.o.-year-old patient lying in the bed with no acute distress.  EYES: Pupils equal, round, reactive to light and accommodation. No scleral icterus. Extraocular muscles intact.  HEENT: Head atraumatic, normocephalic. Oropharynx and nasopharynx clear.  NECK:  Supple, no jugular venous distention. No thyroid enlargement, no tenderness.  LUNGS: Normal breath sounds bilaterally, no wheezing, rales,rhonchi or crepitation. No use of accessory muscles of respiration.  CARDIOVASCULAR: Regular rate and rhythm, S1, S2 normal. No murmurs, rubs, or gallops.  ABDOMEN: Soft, nondistended, nontender. Bowel sounds present. No organomegaly or mass.  EXTREMITIES: No pedal edema, cyanosis, or clubbing.  NEUROLOGIC: Cranial nerves II through XII are intact. Muscle strength 5/5 in all extremities. Sensation intact. Gait not checked.  PSYCHIATRIC: The patient is alert and oriented x 3.  Normal affect and good eye contact. SKIN: No obvious rash, lesion, or ulcer.   LABORATORY PANEL:   CBC Recent Labs  Lab 11/02/23 2258  WBC 4.5  HGB 12.9*  HCT 37.9*  PLT 181   ------------------------------------------------------------------------------------------------------------------  Chemistries  Recent Labs  Lab 11/02/23 2258  NA 135  K 3.7  CL 96*  CO2 24  GLUCOSE 329*  BUN 14  CREATININE 1.22  CALCIUM 9.5   ------------------------------------------------------------------------------------------------------------------  Cardiac Enzymes No  results for input(s): "TROPONINI" in the last 168 hours. ------------------------------------------------------------------------------------------------------------------  RADIOLOGY:  CT Angio Chest Pulmonary Embolism (PE) W or WO Contrast Result Date: 11/03/2023 CLINICAL DATA:  Pulmonary embolus suspected with high probability. Heaviness in the central chest. High blood sugar. EXAM: CT ANGIOGRAPHY CHEST WITH CONTRAST TECHNIQUE: Multidetector CT imaging of the chest was performed using the standard protocol during bolus administration of intravenous contrast. Multiplanar CT image reconstructions and MIPs were obtained to evaluate the vascular anatomy. RADIATION DOSE REDUCTION: This exam was performed according to the departmental dose-optimization program which includes automated exposure control, adjustment of the mA and/or kV according to patient size and/or use of iterative reconstruction technique. CONTRAST:  75mL OMNIPAQUE IOHEXOL 350 MG/ML SOLN COMPARISON:  Chest radiograph 11/02/2023. CT chest abdomen and pelvis 05/04/2023 FINDINGS: Cardiovascular: Technically adequate study with good opacification of the central and segmental pulmonary arteries. Mild motion artifact. No focal filling defects are demonstrated. No evidence of significant pulmonary embolus. Normal heart size. No pericardial effusions. Normal caliber thoracic aorta. No aortic dissection. Scattered aortic calcification. Mild calcification of coronary arteries. Mediastinum/Nodes: Thyroid gland is unremarkable. Esophagus is decompressed. Scattered lymph nodes are not pathologically enlarged. Lungs/Pleura: Small scattered calcified granulomas in the lungs. No significant pulmonary nodules. No airspace disease or consolidation in the lungs. No pleural effusion or pneumothorax. Upper Abdomen: Surgical absence of the gallbladder. Spleen is enlarged. No acute abnormality seen. Musculoskeletal: Degenerative changes in the spine. No acute bony  abnormalities. Review of the MIP images confirms the above findings. IMPRESSION: 1. No evidence of significant pulmonary embolus. 2. No evidence of active pulmonary disease. 3. Splenic enlargement. Electronically Signed   By: Marisa Cyphers.D.  On: 11/03/2023 01:24   DG Chest 2 View Result Date: 11/02/2023 CLINICAL DATA:  Chest pain chest x-ray 05/04/2023 EXAM: CHEST - 2 VIEW COMPARISON:  None Available. FINDINGS: The heart size and mediastinal contours are within normal limits. Both lungs are clear. The visualized skeletal structures are unremarkable. IMPRESSION: No active cardiopulmonary disease. Electronically Signed   By: Darliss Cheney M.D.   On: 11/02/2023 23:29      IMPRESSION AND PLAN:  Assessment and Plan: * Chest pain - The patient will be admitted to an observation cardiac telemetry bed. - Will follow serial troponins and EKGs. - The patient will be placed on aspirin as well as p.r.n. sublingual nitroglycerin and morphine sulfate for pain. - We will obtain a cardiology consult in a.m. for further cardiac risk stratification. - I notified Dr. Mikal Plane about the patient.   GERD without esophagitis - We will continue PPI therapy.  BPH (benign prostatic hyperplasia) - We will continue Flomax.  Type 2 diabetes mellitus with peripheral neuropathy (HCC) - The will be placed on supplement coverage with NovoLog. - We will continue basal coverage. - We will continue Neurontin.  Essential hypertension - Will need antihypertensive therapy.  Dyslipidemia - We will continue statin therapy.   DVT prophylaxis: Lovenox.  Advanced Care Planning:  Code Status: The patient is DNR and DNI.  This was discussed with him. Family Communication:  The plan of care was discussed in details with the patient (and family). I answered all questions. The patient agreed to proceed with the above mentioned plan. Further management will depend upon hospital course. Disposition Plan: Back to  previous home environment Consults called: Cardiology  All the records are reviewed and case discussed with ED provider.  Status is: Observation  I certify that at the time of admission, it is my clinical judgment that the patient will require  hospital care extending less than 2 midnights.                            Dispo: The patient is from: Home              Anticipated d/c is to: Home              Patient currently is not medically stable to d/c.              Difficult to place patient: No  Hannah Beat M.D on 11/03/2023 at 5:12 AM  [facility]   From 7 PM-7 AM, contact night-coverage [email or web address]  CC: Primary care physician; Elfredia Nevins, MD

## 2023-11-03 NOTE — Inpatient Diabetes Management (Signed)
 Inpatient Diabetes Program Recommendations  AACE/ADA: New Consensus Statement on Inpatient Glycemic Control  Target Ranges:  Prepandial:   less than 140 mg/dL      Peak postprandial:   less than 180 mg/dL (1-2 hours)      Critically ill patients:  140 - 180 mg/dL    Latest Reference Range & Units 11/03/23 05:29  Glucose-Capillary 70 - 99 mg/dL 865 (H)    Latest Reference Range & Units 11/02/23 22:58  Glucose 70 - 99 mg/dL 784 (H)   Review of Glycemic Control  Diabetes history: DM2 Outpatient Diabetes medications: Lantus 20 units at bedtime, 70/30 52 units BID Current orders for Inpatient glycemic control: Semglee 20 units at bedtime, 70/30 52 units BID, Novolog 0-9 units Q4H  Inpatient Diabetes Program Recommendations:    Insulin: Patient is NPO and CBG this am is 104 mg/dl. Please consider discontinuing 70/30 at this time.  Thanks, Orlando Penner, RN, MSN, CDCES Diabetes Coordinator Inpatient Diabetes Program [phone] (Team Pager from 8am to 5pm)

## 2023-11-03 NOTE — TOC CM/SW Note (Signed)
 Transition of Care [facility]) - Inpatient Brief Assessment   Patient Details  Name: Mike Floyd MRN: [id number] Date of Birth: May 28, 1961  Transition of Care [facility]) CM/SW Contact:    Villa Herb, LCSWA Phone Number: 11/03/2023, 10:14 AM   Clinical Narrative: Transition of Care Department [facility]) has reviewed patient and no TOC needs have been identified at this time. We will continue to monitor patient advancement through interdiciplinary progression rounds. If new patient transition needs arise, please place a TOC consult.   Transition of Care Asessment: Insurance and Status: Insurance coverage has been reviewed Patient has primary care physician: Yes Home environment has been reviewed: From home Prior level of function:: Independent Prior/Current Home Services: No current home services Social Drivers of Health Review: SDOH reviewed no interventions necessary Readmission risk has been reviewed: Yes Transition of care needs: no transition of care needs at this time

## 2023-11-03 NOTE — Assessment & Plan Note (Signed)
-   The will be placed on supplement coverage with NovoLog. - We will continue basal coverage. - We will continue Neurontin.

## 2023-11-03 NOTE — Assessment & Plan Note (Signed)
 -  We will continue PPI therapy

## 2023-11-03 NOTE — ED Provider Notes (Signed)
 [facility]-EMERGENCY DEPT [facility] Emergency Department Provider Note MRN:  [id number]  Arrival date & time: 11/03/23     Chief Complaint   Chest Pain   History of Present Illness   Mike Floyd is a 63 y.o. year-old male with a history of CAD, diabetes presenting to the ED with chief complaint of chest pain.  Severe pressure center of the chest starting an hour or 2 ago.  Denies fever or cough, no leg pain or swelling, no abdominal pain.  Review of Systems  A thorough review of systems was obtained and all systems are negative except as noted in the HPI and PMH.   Patient's Health History    Past Medical History:  Diagnosis Date   AICD (automatic cardioverter/defibrillator) present 11/2007   Removed in 2018, a. 11/2007 SJM Current VR - single lead ICD  - Removed 2018 - "it was burning me"   Anxiety    CAD (coronary artery disease)    non-obstructive CAD by Cor CT in 2020 // Myoview 3/22: EF 57, small inf-sept defect c/w scar, no ischemia; low risk      Chest pain    a. 10/2007 Cath:  normal Cors.   CKD (chronic kidney disease), stage II    DDD (degenerative disc disease), lumbar    Diabetes mellitus DX: 2010   type 2   Erosive esophagitis    a. per EGD (08/2011), Dr. Karilyn Cota - Erosive reflux esophagitis improved but not completely healed since previous EGD 3 years ago. Bx showing  ulcerated gatroesophageal junction mucosa. negative for H. pylori   GERD (gastroesophageal reflux disease)    Gout    Hearing deficit    a. wear bilateral hearing aides   History of hiatal hernia    History of kidney stones    passed stones, no surgery   Hypertension    Mildly dilatd aortic root (HCC)    CMR 4/22: EF 52, no LGE; d/w Dr. Lorretta Harp root 38 mm (mildly dilated)   Myocardial infarction [facility]) 2011   Neuropathy    Feet and legs   Nonischemic dilated cardiomyopathy (HCC)    a. H/O EF as low as 35-40% by LV gram 10/2007;  b. Echo 02/2011 EF 50-55%, inf HK, Gr 1 DD // CMR 4/22: EF 52,  no LGE; d/w Dr. Lorretta Harp root 38 mm (mildly dilated)    Renal insufficiency    Sleep apnea    pt doesnt use, states"I cant afford one". PCP aware   Stroke (HCC)    mini-stroke in 2014   TIA (transient ischemic attack)    July, 2013   Tobacco abuse, in remission 06/27/2009   Discontinued in 2009     Wears dentures    top plate   WPW (Wolff-Parkinson-White syndrome)    a. s/p RFCA @ [facility] - 1999    Past Surgical History:  Procedure Laterality Date   AMPUTATION Left 05/19/2022   Procedure: TRANSMETATARSAL AMPUTATION LEFT FOOT;  Surgeon: Nadara Mustard, MD;  Location: [facility] OR;  Service: Orthopedics;  Laterality: Left;   AMPUTATION Left 06/17/2023   Procedure: LEFT BELOW KNEE AMPUTATION;  Surgeon: Nadara Mustard, MD;  Location: [facility] OR;  Service: Orthopedics;  Laterality: Left;   AMPUTATION Left 08/19/2023   Procedure: LEFT ABOVE KNEE AMPUTATION;  Surgeon: Nadara Mustard, MD;  Location: [facility] OR;  Service: Orthopedics;  Laterality: Left;   APPENDECTOMY     BACK SURGERY     1995   CARDIAC CATHETERIZATION  2009   CARDIAC DEFIBRILLATOR PLACEMENT     ICD was removed in 2018   CHOLECYSTECTOMY     CHOLECYSTECTOMY, LAPAROSCOPIC     11/2007   COLONOSCOPY W/ POLYPECTOMY  2009   ELBOW SURGERY Left 06/2010   ESOPHAGEAL DILATION N/A 11/08/2014   Procedure: ESOPHAGEAL DILATION;  Surgeon: Malissa Hippo, MD;  Location: [facility] ORS;  Service: Endoscopy;  Laterality: N/A;  #56,    ESOPHAGOGASTRODUODENOSCOPY  03/31/2012   also 08/2011; [name]   ESOPHAGOGASTRODUODENOSCOPY (EGD) WITH PROPOFOL N/A 11/08/2014   Procedure: ESOPHAGOGASTRODUODENOSCOPY (EGD) WITH PROPOFOL;  Surgeon: Malissa Hippo, MD;  Location: [facility] ORS;  Service: Endoscopy;  Laterality: N/A;  Hiatus is 36 , GE Junction is 37   FOOT ARTHRODESIS Left 10/24/2020   Procedure: LEFT GASTROCNEMIUS RECESSION, DORSIFLEXION OSTEOTOMY 1ST MT;  Surgeon: Nadara Mustard, MD;  Location: [facility] OR;  Service: Orthopedics;  Laterality: Left;   FOOT ARTHRODESIS  Right 01/09/2021   Procedure: CLOSING WEDGE OSTEOTOMY RIGHT 1ST METATARSAL;  Surgeon: Nadara Mustard, MD;  Location: [facility] OR;  Service: Orthopedics;  Laterality: Right;   GASTROCNEMIUS RECESSION Right 01/09/2021   Procedure: RIGHT GASTROCNEMIUS RECESSION;  Surgeon: Nadara Mustard, MD;  Location: [facility] OR;  Service: Orthopedics;  Laterality: Right;   ICD LEAD REMOVAL N/A 10/07/2016   Procedure: ICD LEAD REMOVAL ;  Surgeon: Marinus Maw, MD;  Location: [facility] OR;  Service: Cardiovascular;  Laterality: N/A;   MULTIPLE TOOTH EXTRACTIONS     RADIOFREQUENCY ABLATION  1999   WPW; performed at [facility]   STUMP REVISION Left 07/22/2023   Procedure: REVISION LEFT BELOW KNEE AMPUTATION;  Surgeon: Nadara Mustard, MD;  Location: [facility] OR;  Service: Orthopedics;  Laterality: Left;   TEE WITHOUT CARDIOVERSION N/A 10/07/2016   Procedure: TRANSESOPHAGEAL ECHOCARDIOGRAM (TEE);  Surgeon: Marinus Maw, MD;  Location: [facility] OR;  Service: Cardiovascular;  Laterality: N/A;    Family History  Problem Relation Age of Onset   Heart attack Mother 88   Hypertension Mother    Diabetes Mother    Kidney disease Mother    Breast cancer Mother    Heart attack Father 34   Hypertension Father    Diabetes Father    Stroke Brother    Heart attack Brother 57   Stroke Maternal Grandmother 37   Diabetes Brother    Hypertension Brother     Social History   Socioeconomic History   Marital status: Married    Spouse name: Not on file   Number of children: Not on file   Years of education: Not on file   Highest education level: Not on file  Occupational History   Occupation: Worked for [employer]    Employer: [employer]    Comment: Laid off in 11/11  Tobacco Use   Smoking status: Former    Current packs/day: 0.00    Average packs/day: 1.5 packs/day for 39.1 years (58.6 ttl pk-yrs)    Types: Cigarettes    Start date: 06/30/1971    Quit date: 07/30/2010    Years since quitting: 13.2   Smokeless tobacco: Former    Types:  Chew    Quit date: 03/26/1994  Vaping Use   Vaping status: Former   Start date: 07/30/2010   Quit date: 06/29/2016  Substance and Sexual Activity   Alcohol use: No    Alcohol/week: 1.0 standard drink of alcohol    Types: 1 Cans of beer per week    Comment: used "years ago"   Drug use: No  Sexual activity: Yes    Birth control/protection: None  Other Topics Concern   Not on file  Social History Narrative   Not on file   Social Drivers of Health   Financial Resource Strain: Not on file  Food Insecurity: No Food Insecurity (08/19/2023)   Hunger Vital Sign    Worried About Running Out of Food in the Last Year: Never true    Ran Out of Food in the Last Year: Never true  Transportation Needs: No Transportation Needs (08/19/2023)   PRAPARE - Administrator, Civil Service (Medical): No    Lack of Transportation (Non-Medical): No  Physical Activity: Not on file  Stress: Not on file  Social Connections: Not on file  Intimate Partner Violence: Not At Risk (08/19/2023)   Humiliation, Afraid, Rape, and Kick questionnaire    Fear of Current or Ex-Partner: No    Emotionally Abused: No    Physically Abused: No    Sexually Abused: No     Physical Exam   Vitals:   11/03/23 0200 11/03/23 0230  BP: 102/65 97/72  Pulse: 88 86  Resp: 11 17  Temp:    SpO2: 100% 100%    CONSTITUTIONAL: Chronically ill-appearing, NAD NEURO/PSYCH:  Alert and oriented x 3, no focal deficits EYES:  eyes equal and reactive ENT/NECK:  no LAD, no JVD CARDIO: Regular rate, well-perfused, normal S1 and S2 PULM:  CTAB no wheezing or rhonchi GI/GU:  non-distended, non-tender MSK/SPINE: Left above-the-knee amputation, no edema to the right leg SKIN:  no rash, atraumatic   *Additional and/or pertinent findings included in MDM below  Diagnostic and Interventional Summary    EKG Interpretation Date/Time:  Wednesday November 02 2023 22:44:43 EDT Ventricular Rate:  106 PR Interval:  113 QRS  Duration:  94 QT Interval:  353 QTC Calculation: 469 R Axis:   79  Text Interpretation: Sinus tachycardia Confirmed by Kennis Carina [phone]) on 11/03/2023 12:19:32 AM       Labs Reviewed  BASIC METABOLIC PANEL - Abnormal; Notable for the following components:      Result Value   Chloride 96 (*)    Glucose, Bld 329 (*)    All other components within normal limits  CBC - Abnormal; Notable for the following components:   Hemoglobin 12.9 (*)    HCT 37.9 (*)    MCV 79.8 (*)    All other components within normal limits  TROPONIN I (HIGH SENSITIVITY)  TROPONIN I (HIGH SENSITIVITY)    CT Angio Chest Pulmonary Embolism (PE) W or WO Contrast  Final Result    DG Chest 2 View  Final Result      Medications  sodium chloride 0.9 % bolus 1,000 mL (0 mLs Intravenous Stopped 11/03/23 0134)  morphine (PF) 4 MG/ML injection 4 mg (4 mg Intravenous Given 11/03/23 0016)  iohexol (OMNIPAQUE) 350 MG/ML injection 75 mL (75 mLs Intravenous Contrast Given 11/03/23 0057)     Procedures  /  Critical Care Procedures  ED Course and Medical Decision Making  Initial Impression and Ddx Patient is a active pain, mildly tachypneic, mild tachycardia, on my evaluation soft blood pressure 90/50.  Differential diagnosis includes ACS, PE, less likely dissection.  Providing pain control, obtaining initial labs, chest x-ray, suspect need for advanced imaging of the chest.  Past medical/surgical history that increases complexity of ED encounter: CAD, TIA, diabetes  Interpretation of Diagnostics I personally reviewed the EKG and my interpretation is as follows: Sinus rhythm with nonspecific findings  No significant blood count or electrolyte disturbance.  Troponin negative x 2.  CTA without evidence of PE.  Patient Reassessment and Ultimate Disposition/Management     Patient with improved pain, improved blood pressure after fluids.  Looks like his baseline blood pressure is 90s to 100 systolic.  Accepted for  admission by hospitalist service for chest pain rule out observation.  Patient management required discussion with the following services or consulting groups:  Hospitalist Service  Complexity of Problems Addressed Acute illness or injury that poses threat of life of bodily function  Additional Data Reviewed and Analyzed Further history obtained from: Recent discharge summary  Additional Factors Impacting ED Encounter Risk Consideration of hospitalization  Elmer Sow. Pilar Plate, MD [facility] Emergency Medicine [facility] [email or web address]  Final Clinical Impressions(s) / ED Diagnoses     ICD-10-CM   1. Chest pain, unspecified type  R07.9       ED Discharge Orders     None        Discharge Instructions Discussed with and Provided to Patient:   Discharge Instructions   None      Sabas Sous, MD 11/03/23 [phone]

## 2023-11-07 DIAGNOSIS — Z794 Long term (current) use of insulin: Secondary | ICD-10-CM | POA: Diagnosis not present

## 2023-11-07 DIAGNOSIS — I420 Dilated cardiomyopathy: Secondary | ICD-10-CM | POA: Diagnosis not present

## 2023-11-07 DIAGNOSIS — Z89612 Acquired absence of left leg above knee: Secondary | ICD-10-CM | POA: Diagnosis not present

## 2023-11-07 DIAGNOSIS — I2510 Atherosclerotic heart disease of native coronary artery without angina pectoris: Secondary | ICD-10-CM | POA: Diagnosis not present

## 2023-11-07 DIAGNOSIS — T8131XA Disruption of external operation (surgical) wound, not elsewhere classified, initial encounter: Secondary | ICD-10-CM | POA: Diagnosis not present

## 2023-11-07 DIAGNOSIS — N183 Chronic kidney disease, stage 3 unspecified: Secondary | ICD-10-CM | POA: Diagnosis not present

## 2023-11-07 DIAGNOSIS — I5022 Chronic systolic (congestive) heart failure: Secondary | ICD-10-CM | POA: Diagnosis not present

## 2023-11-07 DIAGNOSIS — E1122 Type 2 diabetes mellitus with diabetic chronic kidney disease: Secondary | ICD-10-CM | POA: Diagnosis not present

## 2023-11-07 DIAGNOSIS — E1140 Type 2 diabetes mellitus with diabetic neuropathy, unspecified: Secondary | ICD-10-CM | POA: Diagnosis not present

## 2023-11-07 DIAGNOSIS — I130 Hypertensive heart and chronic kidney disease with heart failure and stage 1 through stage 4 chronic kidney disease, or unspecified chronic kidney disease: Secondary | ICD-10-CM | POA: Diagnosis not present

## 2023-11-09 DIAGNOSIS — E1165 Type 2 diabetes mellitus with hyperglycemia: Secondary | ICD-10-CM | POA: Diagnosis not present

## 2023-11-09 DIAGNOSIS — E1140 Type 2 diabetes mellitus with diabetic neuropathy, unspecified: Secondary | ICD-10-CM | POA: Diagnosis not present

## 2023-11-09 DIAGNOSIS — I43 Cardiomyopathy in diseases classified elsewhere: Secondary | ICD-10-CM | POA: Diagnosis not present

## 2023-11-09 DIAGNOSIS — R634 Abnormal weight loss: Secondary | ICD-10-CM | POA: Diagnosis not present

## 2023-11-09 DIAGNOSIS — E11319 Type 2 diabetes mellitus with unspecified diabetic retinopathy without macular edema: Secondary | ICD-10-CM | POA: Diagnosis not present

## 2023-11-09 DIAGNOSIS — I77810 Thoracic aortic ectasia: Secondary | ICD-10-CM | POA: Diagnosis not present

## 2023-11-09 DIAGNOSIS — I200 Unstable angina: Secondary | ICD-10-CM | POA: Diagnosis not present

## 2023-11-09 DIAGNOSIS — N1831 Chronic kidney disease, stage 3a: Secondary | ICD-10-CM | POA: Diagnosis not present

## 2023-11-09 DIAGNOSIS — E08621 Diabetes mellitus due to underlying condition with foot ulcer: Secondary | ICD-10-CM | POA: Diagnosis not present

## 2023-11-09 DIAGNOSIS — E1129 Type 2 diabetes mellitus with other diabetic kidney complication: Secondary | ICD-10-CM | POA: Diagnosis not present

## 2023-11-09 DIAGNOSIS — G894 Chronic pain syndrome: Secondary | ICD-10-CM | POA: Diagnosis not present

## 2023-11-18 DIAGNOSIS — M86172 Other acute osteomyelitis, left ankle and foot: Secondary | ICD-10-CM | POA: Diagnosis not present

## 2023-11-28 ENCOUNTER — Ambulatory Visit (INDEPENDENT_AMBULATORY_CARE_PROVIDER_SITE_OTHER): Admitting: Orthopedic Surgery

## 2023-11-28 ENCOUNTER — Encounter: Payer: Self-pay | Admitting: Orthopedic Surgery

## 2023-11-28 VITALS — Wt 174.4 lb

## 2023-11-28 DIAGNOSIS — Z89612 Acquired absence of left leg above knee: Secondary | ICD-10-CM | POA: Diagnosis not present

## 2023-11-28 NOTE — Progress Notes (Addendum)
 Office Visit Note   Patient: Mike Floyd           Date of Birth: 02/18/1961           MRN: [id number] Visit Date: 11/28/2023              Requested by: Bertell Satterfield, MD [address] [place],  [place] [address] PCP: Bertell Satterfield, MD  Chief Complaint  Patient presents with   Left Leg - Follow-up    08/19/23 left AKA      HPI: Patient is a 63 year old gentleman who is over 3 months out from a left above-knee amputation.  Currently using a 4 XL stump shrinker.  Patient states that he has lost significant amount of weight and is down about 5 loops in his buccal.  Assessment & Plan: Visit Diagnoses:  1. Hx of AKA (above knee amputation), left (HCC)     Plan: Patient was weighed today he states he is lost 40 pounds.  Recommend he follow-up with his primary care physician.  Patient is provided a prescription for a K3 level above-knee prosthesis on the left.  Follow-Up Instructions: Return in about 2 months (around 01/28/2024).   Ortho Exam  Patient is alert, oriented, no adenopathy, well-dressed, normal affect, normal respiratory effort. Examination of the left residual limb is well-healed there is no cellulitis no drainage no signs of infection there is good consolidation of the residual limb.   Patient is a new left transfemoral amputee.  Patient's current comorbidities are not expected to impact the ability to function with the prescribed prosthesis. Patient verbally communicates a strong desire to use a prosthesis. Patient currently requires mobility aids to ambulate without a prosthesis.  Expects not to use mobility aids with a new prosthesis. Patient is expected to resume or reach their K Level within 6 months. Patient was active before the amputation and independent with stairs, uneven terrain, varying cadence, and a community ambulator.  Patient is a K2 level ambulator that will use a prosthesis to walk around their home and the community over low level  environmental barriers.      Imaging: No results found. No images are attached to the encounter.  Labs: Lab Results  Component Value Date   HGBA1C 7.7 (H) 06/17/2023   HGBA1C 9.7 (H) 05/13/2022   HGBA1C 7.0 (H) 06/15/2017   ESRSEDRATE 2 03/14/2023   CRP <1 03/14/2023   CRP 0.6 05/16/2022   CRP 2.2 06/27/2019   LABURIC 5.5 03/07/2012   REPTSTATUS 05/18/2022 FINAL 05/13/2022   CULT  05/13/2022    NO GROWTH 5 DAYS Performed at [facility], [address], [place], [place] [address]      Lab Results  Component Value Date   ALBUMIN  4.0 06/17/2023   ALBUMIN  4.4 05/04/2023   ALBUMIN  4.1 02/26/2023   PREALBUMIN 23 06/17/2023    Lab Results  Component Value Date   MG 1.8 02/26/2023   MG 1.7 05/20/2022   MG 2.0 05/19/2022   Lab Results  Component Value Date   VD25OH 18 (L) 06/15/2017    Lab Results  Component Value Date   PREALBUMIN 23 06/17/2023      Latest Ref Rng & Units 11/02/2023   10:58 PM 08/19/2023    8:08 AM 07/22/2023    9:33 AM  CBC EXTENDED  WBC 4.0 - 10.5 K/uL 4.5  6.4  8.4   RBC 4.22 - 5.81 MIL/uL 4.75  5.37  4.88   Hemoglobin 13.0 - 17.0 g/dL 12.9  15.5  14.0   HCT 39.0 - 52.0 % 37.9  45.3  40.8   Platelets 150 - 400 K/uL 181  138  164      Body mass index is 23.01 kg/m.  Orders:  No orders of the defined types were placed in this encounter.  No orders of the defined types were placed in this encounter.    Procedures: No procedures performed  Clinical Data: No additional findings.  ROS:  All other systems negative, except as noted in the HPI. Review of Systems  Objective: Vital Signs: Wt 174 lb 6.4 oz (79.1 kg)   BMI 23.01 kg/m   Specialty Comments:  No specialty comments available.  PMFS History: Patient Active Problem List   Diagnosis Date Noted   Dyslipidemia 11/03/2023   Essential hypertension 11/03/2023   Type 2 diabetes mellitus with peripheral neuropathy (HCC) 11/03/2023   BPH (benign prostatic hyperplasia)  11/03/2023   GERD without esophagitis 11/03/2023   S/P AKA (above knee amputation) unilateral, left (HCC) 08/19/2023   Dehiscence of amputation stump of left lower extremity (HCC) 07/22/2023   Ischemia of site of left below knee amputation (HCC) 07/22/2023   S/P BKA (below knee amputation) unilateral, left (HCC) 07/22/2023   Below-knee amputation of left lower extremity (HCC) 06/17/2023   Acute osteomyelitis of metatarsal bone of left foot (HCC)    Subacute osteomyelitis, left ankle and foot (HCC) 05/13/2022   Non-pressure chronic ulcer of other part of right foot limited to breakdown of skin (HCC)    Contracture of right Achilles tendon    Mildly dilatd aortic root (HCC)    Contracture of left Achilles tendon    Metatarsal deformity, left    CAD (coronary artery disease)    Hallux hammertoe, left 12/11/2019   Diarrhea 06/27/2019   Diastasis recti 08/22/2018   Umbilical hernia without obstruction and without gangrene 08/22/2018   Vitamin D  deficiency 06/23/2017   Essential hypertension, benign 06/15/2017   Mixed hyperlipidemia 06/15/2017   Class 1 obesity due to excess calories with serious comorbidity and body mass index (BMI) of 31.0 to 31.9 in adult 06/15/2017   Malfunction of implantable cardioverter-defibrillator (ICD) electrode 10/07/2016   Chronic kidney disease (CKD), stage III (moderate) (HCC) 04/04/2015   Bladder neck obstruction 03/21/2015   Difficult or painful urination 03/21/2015   Flank pain 03/21/2015   Delayed onset of urination 03/21/2015   Calculus of kidney 03/21/2015   Erosive esophagitis 12/27/2013   Hypotension due to drugs 07/02/2013   Chronic systolic heart failure (HCC) 04/24/2013   CAP (community acquired pneumonia) 03/16/2013   HTN (hypertension) 03/15/2013   Hydrocele 03/15/2013   Spermatocele 03/15/2013   DOE (dyspnea on exertion) 03/15/2013   OSA (obstructive sleep apnea) 01/09/2013   Chronic kidney disease, stage 3 (HCC) 01/03/2013   Chest pain  12/26/2012   History of diagnostic tests 09/06/2012   TIA (transient ischemic attack)    Cardiomyopathy, nonischemic (HCC) 02/24/2010   AICD (automatic cardioverter/defibrillator) 02/24/2010   DM type 2 causing vascular disease (HCC) 06/27/2009   Gout 06/27/2009   Tobacco abuse, in remission 06/27/2009   COLONIC POLYPS 10/31/2008   GERD (gastroesophageal reflux disease) 10/31/2008   Past Medical History:  Diagnosis Date   AICD (automatic cardioverter/defibrillator) present 11/2007   Removed in 2018, a. 11/2007 SJM Current VR - single lead ICD  - Removed 2018 - it was burning me   Anxiety    CAD (coronary artery disease)    non-obstructive CAD by Cor CT in 2020 //  Myoview  3/22: EF 57, small inf-sept defect c/w scar, no ischemia; low risk      Chest pain    a. 10/2007 Cath:  normal Cors.   CKD (chronic kidney disease), stage II    DDD (degenerative disc disease), lumbar    Diabetes mellitus DX: 2010   type 2   Erosive esophagitis    a. per EGD (08/2011), Dr. Golda - Erosive reflux esophagitis improved but not completely healed since previous EGD 3 years ago. Bx showing  ulcerated gatroesophageal junction mucosa. negative for H. pylori   GERD (gastroesophageal reflux disease)    Gout    Hearing deficit    a. wear bilateral hearing aides   History of hiatal hernia    History of kidney stones    passed stones, no surgery   Hypertension    Mildly dilatd aortic root (HCC)    CMR 4/22: EF 52, no LGE; d/w Dr. Ione root 38 mm (mildly dilated)   Myocardial infarction [facility]) 2011   Neuropathy    Feet and legs   Nonischemic dilated cardiomyopathy (HCC)    a. H/O EF as low as 35-40% by LV gram 10/2007;  b. Echo 02/2011 EF 50-55%, inf HK, Gr 1 DD // CMR 4/22: EF 52, no LGE; d/w Dr. Ione root 38 mm (mildly dilated)    Renal insufficiency    Sleep apnea    pt doesnt use, statesI cant afford one. PCP aware   Stroke (HCC)    mini-stroke in 2014   TIA (transient ischemic attack)     July, 2013   Tobacco abuse, in remission 06/27/2009   Discontinued in 2009     Wears dentures    top plate   WPW (Wolff-Parkinson-White syndrome)    a. s/p RFCA @ [facility] - 1999    Family History  Problem Relation Age of Onset   Heart attack Mother 110   Hypertension Mother    Diabetes Mother    Kidney disease Mother    Breast cancer Mother    Heart attack Father 12   Hypertension Father    Diabetes Father    Stroke Brother    Heart attack Brother 60   Stroke Maternal Grandmother 69   Diabetes Brother    Hypertension Brother     Past Surgical History:  Procedure Laterality Date   AMPUTATION Left 05/19/2022   Procedure: TRANSMETATARSAL AMPUTATION LEFT FOOT;  Surgeon: Harden Jerona GAILS, MD;  Location: [facility] OR;  Service: Orthopedics;  Laterality: Left;   AMPUTATION Left 06/17/2023   Procedure: LEFT BELOW KNEE AMPUTATION;  Surgeon: Harden Jerona GAILS, MD;  Location: [facility] OR;  Service: Orthopedics;  Laterality: Left;   AMPUTATION Left 08/19/2023   Procedure: LEFT ABOVE KNEE AMPUTATION;  Surgeon: Harden Jerona GAILS, MD;  Location: [facility] OR;  Service: Orthopedics;  Laterality: Left;   APPENDECTOMY     BACK SURGERY     1995   CARDIAC CATHETERIZATION     2009   CARDIAC DEFIBRILLATOR PLACEMENT     ICD was removed in 2018   CHOLECYSTECTOMY     CHOLECYSTECTOMY, LAPAROSCOPIC     11/2007   COLONOSCOPY W/ POLYPECTOMY  2009   ELBOW SURGERY Left 06/2010   ESOPHAGEAL DILATION N/A 11/08/2014   Procedure: ESOPHAGEAL DILATION;  Surgeon: Claudis RAYMOND Golda, MD;  Location: [facility]S;  Service: Endoscopy;  Laterality: N/A;  #56,    ESOPHAGOGASTRODUODENOSCOPY  03/31/2012   also 08/2011; [name]   ESOPHAGOGASTRODUODENOSCOPY (EGD) WITH PROPOFOL  N/A 11/08/2014   Procedure:  ESOPHAGOGASTRODUODENOSCOPY (EGD) WITH PROPOFOL ;  Surgeon: Claudis RAYMOND Rivet, MD;  Location: [facility];  Service: Endoscopy;  Laterality: N/A;  Hiatus is 22 , GE Junction is 37   FOOT ARTHRODESIS Left 10/24/2020   Procedure: LEFT GASTROCNEMIUS RECESSION,  DORSIFLEXION OSTEOTOMY 1ST MT;  Surgeon: Harden Jerona GAILS, MD;  Location: [facility] OR;  Service: Orthopedics;  Laterality: Left;   FOOT ARTHRODESIS Right 01/09/2021   Procedure: CLOSING WEDGE OSTEOTOMY RIGHT 1ST METATARSAL;  Surgeon: Harden Jerona GAILS, MD;  Location: [facility] OR;  Service: Orthopedics;  Laterality: Right;   GASTROCNEMIUS RECESSION Right 01/09/2021   Procedure: RIGHT GASTROCNEMIUS RECESSION;  Surgeon: Harden Jerona GAILS, MD;  Location: [facility] OR;  Service: Orthopedics;  Laterality: Right;   ICD LEAD REMOVAL N/A 10/07/2016   Procedure: ICD LEAD REMOVAL ;  Surgeon: Danelle LELON Birmingham, MD;  Location: [facility] OR;  Service: Cardiovascular;  Laterality: N/A;   MULTIPLE TOOTH EXTRACTIONS     RADIOFREQUENCY ABLATION  1999   WPW; performed [facility]   STUMP REVISION Left 07/22/2023   Procedure: REVISION LEFT BELOW KNEE AMPUTATION;  Surgeon: Harden Jerona GAILS, MD;  Location: [facility] OR;  Service: Orthopedics;  Laterality: Left;   TEE WITHOUT CARDIOVERSION N/A 10/07/2016   Procedure: TRANSESOPHAGEAL ECHOCARDIOGRAM (TEE);  Surgeon: Danelle LELON Birmingham, MD;  Location: [facility] OR;  Service: Cardiovascular;  Laterality: N/A;   Social History   Occupational History   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    Comment: Laid off in 11/11  Tobacco Use   Smoking status: Former    Current packs/day: 0.00    Average packs/day: 1.5 packs/day for 39.1 years (58.6 ttl pk-yrs)    Types: Cigarettes    Start date: 06/30/1971    Quit date: 07/30/2010    Years since quitting: 13.3   Smokeless tobacco: Former    Types: Chew    Quit date: 03/26/1994  Vaping Use   Vaping status: Former   Start date: 07/30/2010   Quit date: 06/29/2016  Substance and Sexual Activity   Alcohol use: No    Alcohol/week: 1.0 standard drink of alcohol    Types: 1 Cans of beer per week    Comment: used years ago   Drug use: No   Sexual activity: Yes    Birth control/protection: None

## 2023-12-09 DIAGNOSIS — E1140 Type 2 diabetes mellitus with diabetic neuropathy, unspecified: Secondary | ICD-10-CM | POA: Diagnosis not present

## 2023-12-09 DIAGNOSIS — E08621 Diabetes mellitus due to underlying condition with foot ulcer: Secondary | ICD-10-CM | POA: Diagnosis not present

## 2023-12-09 DIAGNOSIS — G894 Chronic pain syndrome: Secondary | ICD-10-CM | POA: Diagnosis not present

## 2023-12-09 DIAGNOSIS — E1165 Type 2 diabetes mellitus with hyperglycemia: Secondary | ICD-10-CM | POA: Diagnosis not present

## 2023-12-09 DIAGNOSIS — M1991 Primary osteoarthritis, unspecified site: Secondary | ICD-10-CM | POA: Diagnosis not present

## 2023-12-09 DIAGNOSIS — E1129 Type 2 diabetes mellitus with other diabetic kidney complication: Secondary | ICD-10-CM | POA: Diagnosis not present

## 2023-12-09 DIAGNOSIS — N183 Chronic kidney disease, stage 3 unspecified: Secondary | ICD-10-CM | POA: Diagnosis not present

## 2023-12-18 DIAGNOSIS — M86172 Other acute osteomyelitis, left ankle and foot: Secondary | ICD-10-CM | POA: Diagnosis not present

## 2024-01-09 DIAGNOSIS — N1831 Chronic kidney disease, stage 3a: Secondary | ICD-10-CM | POA: Diagnosis not present

## 2024-01-09 DIAGNOSIS — I77810 Thoracic aortic ectasia: Secondary | ICD-10-CM | POA: Diagnosis not present

## 2024-01-09 DIAGNOSIS — E11319 Type 2 diabetes mellitus with unspecified diabetic retinopathy without macular edema: Secondary | ICD-10-CM | POA: Diagnosis not present

## 2024-01-09 DIAGNOSIS — G894 Chronic pain syndrome: Secondary | ICD-10-CM | POA: Diagnosis not present

## 2024-01-09 DIAGNOSIS — E1165 Type 2 diabetes mellitus with hyperglycemia: Secondary | ICD-10-CM | POA: Diagnosis not present

## 2024-01-09 DIAGNOSIS — E1140 Type 2 diabetes mellitus with diabetic neuropathy, unspecified: Secondary | ICD-10-CM | POA: Diagnosis not present

## 2024-01-09 DIAGNOSIS — M67911 Unspecified disorder of synovium and tendon, right shoulder: Secondary | ICD-10-CM | POA: Diagnosis not present

## 2024-01-09 DIAGNOSIS — I43 Cardiomyopathy in diseases classified elsewhere: Secondary | ICD-10-CM | POA: Diagnosis not present

## 2024-01-09 DIAGNOSIS — R634 Abnormal weight loss: Secondary | ICD-10-CM | POA: Diagnosis not present

## 2024-01-09 DIAGNOSIS — E1129 Type 2 diabetes mellitus with other diabetic kidney complication: Secondary | ICD-10-CM | POA: Diagnosis not present

## 2024-01-09 DIAGNOSIS — M67912 Unspecified disorder of synovium and tendon, left shoulder: Secondary | ICD-10-CM | POA: Diagnosis not present

## 2024-01-18 DIAGNOSIS — M86172 Other acute osteomyelitis, left ankle and foot: Secondary | ICD-10-CM | POA: Diagnosis not present

## 2024-01-30 ENCOUNTER — Ambulatory Visit: Admitting: Orthopedic Surgery

## 2024-02-06 ENCOUNTER — Emergency Department (HOSPITAL_COMMUNITY)
Admission: EM | Admit: 2024-02-06 | Discharge: 2024-02-07 | Disposition: A | Discharge status: Home / Self Care | Attending: Emergency Medicine | Admitting: Emergency Medicine

## 2024-02-06 DIAGNOSIS — R0689 Other abnormalities of breathing: Secondary | ICD-10-CM | POA: Diagnosis not present

## 2024-02-06 DIAGNOSIS — R0789 Other chest pain: Secondary | ICD-10-CM | POA: Diagnosis not present

## 2024-02-06 DIAGNOSIS — Z7982 Long term (current) use of aspirin: Secondary | ICD-10-CM | POA: Diagnosis not present

## 2024-02-06 DIAGNOSIS — Z794 Long term (current) use of insulin: Secondary | ICD-10-CM | POA: Diagnosis not present

## 2024-02-06 DIAGNOSIS — R000 Tachycardia, unspecified: Secondary | ICD-10-CM | POA: Diagnosis not present

## 2024-02-06 DIAGNOSIS — Z79899 Other long term (current) drug therapy: Secondary | ICD-10-CM | POA: Diagnosis not present

## 2024-02-06 DIAGNOSIS — I2510 Atherosclerotic heart disease of native coronary artery without angina pectoris: Secondary | ICD-10-CM | POA: Diagnosis not present

## 2024-02-06 DIAGNOSIS — E1140 Type 2 diabetes mellitus with diabetic neuropathy, unspecified: Secondary | ICD-10-CM | POA: Diagnosis not present

## 2024-02-06 DIAGNOSIS — N1831 Chronic kidney disease, stage 3a: Secondary | ICD-10-CM | POA: Diagnosis not present

## 2024-02-06 DIAGNOSIS — I499 Cardiac arrhythmia, unspecified: Secondary | ICD-10-CM | POA: Diagnosis not present

## 2024-02-06 DIAGNOSIS — Z743 Need for continuous supervision: Secondary | ICD-10-CM | POA: Diagnosis not present

## 2024-02-06 DIAGNOSIS — E782 Mixed hyperlipidemia: Secondary | ICD-10-CM | POA: Diagnosis not present

## 2024-02-06 DIAGNOSIS — R079 Chest pain, unspecified: Secondary | ICD-10-CM | POA: Diagnosis not present

## 2024-02-07 ENCOUNTER — Other Ambulatory Visit: Payer: Self-pay

## 2024-02-07 ENCOUNTER — Emergency Department (HOSPITAL_COMMUNITY)

## 2024-02-07 ENCOUNTER — Encounter (HOSPITAL_COMMUNITY): Payer: Self-pay

## 2024-02-07 DIAGNOSIS — R079 Chest pain, unspecified: Secondary | ICD-10-CM | POA: Diagnosis not present

## 2024-02-07 LAB — TROPONIN I (HIGH SENSITIVITY)
Troponin I (High Sensitivity): 3 ng/L (ref <18)
Troponin I (High Sensitivity): 4 ng/L (ref <18)

## 2024-02-07 LAB — BASIC METABOLIC PANEL WITH GFR
Anion gap: 12 (ref 5–15)
BUN: 9 mg/dL (ref 8–23)
CO2: 24 mmol/L (ref 22–32)
Calcium: 9.7 mg/dL (ref 8.9–10.3)
Chloride: 98 mmol/L (ref 98–111)
Creatinine, Ser: 1.16 mg/dL (ref 0.61–1.24)
GFR, Estimated: >60 mL/min (ref >60)
Glucose, Bld: 254 mg/dL — ABNORMAL HIGH (ref 70–99)
Potassium: 3.3 mmol/L — ABNORMAL LOW (ref 3.5–5.1)
Sodium: 134 mmol/L — ABNORMAL LOW (ref 135–145)

## 2024-02-07 LAB — CBC
HCT: 40.8 % (ref 39.0–52.0)
Hemoglobin: 14.3 g/dL (ref 13.0–17.0)
MCH: 29.1 pg (ref 26.0–34.0)
MCHC: 35 g/dL (ref 30.0–36.0)
MCV: 83.1 fL (ref 80.0–100.0)
Platelets: 134 10*3/uL — ABNORMAL LOW (ref 150–400)
RBC: 4.91 MIL/uL (ref 4.22–5.81)
RDW: 14.9 % (ref 11.5–15.5)
WBC: 5.2 10*3/uL (ref 4.0–10.5)
nRBC: 0 % (ref 0.0–0.2)

## 2024-02-07 MED ORDER — FENTANYL CITRATE PF 50 MCG/ML IJ SOSY
50.0000 ug | PREFILLED_SYRINGE | Freq: Once | INTRAMUSCULAR | Status: AC
  Administered 2024-02-07: 50 ug via INTRAVENOUS
  Filled 2024-02-07: qty 1

## 2024-02-07 NOTE — ED Triage Notes (Signed)
 Pt bib [facility] from home c/o chest pain that started around 1400 today. Reports his chest feels heavy and that he cannot take a deep breath.   Pt to 4 baby aspirin  prior to EMS arrival

## 2024-02-07 NOTE — ED Provider Notes (Signed)
  EMERGENCY DEPARTMENT AT [facility]  Provider Note  CSN: [id number] Arrival date & time: 02/06/24 2355  History Chief Complaint  Patient presents with   Chest Pain    Mike Floyd is a 63 y.o. male with history of nonobstructive CAD brought to the ED via EMS for chest pain ongoing since around 1400hrs, non radiating. Worse with deep breath. No cough or fever. No nausea or diaphoresis.    Home Medications Prior to Admission medications   Medication Sig Start Date End Date Taking? Authorizing Provider  acetaminophen  (TYLENOL ) 325 MG tablet Take 2 tablets (650 mg total) by mouth every 4 (four) hours as needed for headache or mild pain (pain score 1-3). 11/03/23   Pearlean Manus, MD  aspirin  81 MG tablet Take 1 tablet (81 mg total) by mouth daily with breakfast. 11/03/23   Emokpae, Courage, MD  atorvastatin  (LIPITOR) 20 MG tablet Take 20 mg by mouth in the morning.    [provider]  carvedilol  (COREG ) 3.125 MG tablet Take 1 tablet (3.125 mg total) by mouth 2 (two) times daily. 11/03/23   Pearlean Manus, MD  famotidine  (PEPCID ) 20 MG tablet Take 20 mg by mouth daily.    [provider]  furosemide  (LASIX ) 20 MG tablet Take 1 tablet (20 mg total) by mouth daily. 03/16/23   Strader, Laymon HERO, PA-C  gabapentin  (NEURONTIN ) 600 MG tablet Take 600 mg by mouth 3 (three) times daily. 09/05/20   [provider]  insulin  glargine (LANTUS ) 100 UNIT/ML injection Inject 20 Units into the skin at bedtime.    [provider]  isosorbide  mononitrate (IMDUR ) 30 MG 24 hr tablet Take 0.5 tablets (15 mg total) by mouth daily. 11/03/23 02/01/24  Pearlean Manus, MD  nitroGLYCERIN  (NITROSTAT ) 0.4 MG SL tablet Place 1 tablet (0.4 mg total) under the tongue every 5 (five) minutes as needed for chest pain. 03/11/23   Strader, Laymon HERO, PA-C  NOVOLOG  MIX 70/30 FLEXPEN (70-30) 100 UNIT/ML FlexPen Inject 52 Units into the skin 2 (two) times daily. 03/05/22    [provider]  oxycodone  (ROXICODONE ) 30 MG immediate release tablet Take 1 tablet (30 mg total) by mouth every 8 (eight) hours as needed for pain. 08/24/23   Harden Jerona GAILS, MD  pantoprazole  (PROTONIX ) 40 MG tablet Take 40 mg by mouth daily. 07/29/21   [provider]  potassium chloride  SA (KLOR-CON  M) 20 MEQ tablet TAKE (1) TABLET BY MOUTH ONCE DAILY. 07/04/23   Nishan, Peter C, MD  tamsulosin  (FLOMAX ) 0.4 MG CAPS capsule Take 0.4 mg by mouth daily. am    [provider]  vitamin B-12 (CYANOCOBALAMIN ) 1000 MCG tablet Take 1,000 mcg by mouth daily.    [provider]  zolpidem  (AMBIEN ) 10 MG tablet Take 10 mg by mouth at bedtime.    [provider]     Allergies    Patient has no known allergies.   Review of Systems   Review of Systems Please see HPI for pertinent positives and negatives  Physical Exam BP 107/74 (BP Location: Right Arm)   Pulse 88   Temp 98 F (36.7 C)   Resp 16   Ht 6' 1 (1.854 m)   Wt 78.9 kg   SpO2 96%   BMI 22.96 kg/m   Physical Exam Vitals and nursing note reviewed.  Constitutional:      Appearance: Normal appearance.  HENT:     Head: Normocephalic and atraumatic.     Nose:  Nose normal.     Mouth/Throat:     Mouth: Mucous membranes are moist.   Eyes:     Extraocular Movements: Extraocular movements intact.     Conjunctiva/sclera: Conjunctivae normal.    Cardiovascular:     Rate and Rhythm: Normal rate.  Pulmonary:     Effort: Pulmonary effort is normal.     Breath sounds: Normal breath sounds.  Chest:     Chest wall: Tenderness (L parasternal, reproduces pain) present.  Abdominal:     General: Abdomen is flat.     Palpations: Abdomen is soft.     Tenderness: There is no abdominal tenderness.   Musculoskeletal:        General: No swelling. Normal range of motion.     Cervical back: Neck supple.     Comments: S/p L AKA   Skin:    General: Skin is warm and dry.   Neurological:      General: No focal deficit present.     Mental Status: He is alert.   Psychiatric:        Mood and Affect: Mood normal.     ED Results / Procedures / Treatments   EKG EKG Interpretation Date/Time:  Tuesday February 07 2024 00:06:18 EDT Ventricular Rate:  88 PR Interval:  172 QRS Duration:  97 QT Interval:  360 QTC Calculation: 436 R Axis:   69  Text Interpretation: Sinus rhythm No significant change since last tracing Confirmed by Roselyn Dunnings [phone]) on 02/07/2024 12:12:33 AM  Procedures Procedures  Medications Ordered in the ED Medications  fentaNYL  (SUBLIMAZE ) injection 50 mcg (50 mcg Intravenous Given 02/07/24 0055)    Initial Impression and Plan  Patient with known CAD, here with atypical chest pain. Had been followed by Cardiology, last seen Oct 2024 for similar pains, had a Coronary CT then with nonobstructive disease and calcium  score of 119. His pain tonight is clearly reproducible, doubt this is ACS/MI, but will check labs and CXR. Pain medication for comfort.   ED Course   Clinical Course as of 02/07/24 0314  Tue Feb 07, 2024  0121 BMP unremarkable. Trop is neg.  [CS]  0129 CBC normal.  [CS]  0312 Repeat Trop remains normal. Patient sleeping soundly in no distress, pain is improved. Suspect MSK pain. Low concern for ACS. He is comfortable going home and will follow up with Cardiology. RTED for any worsening symptoms or other concerns.  [CS]    Clinical Course User Index [CS] Roselyn Dunnings NOVAK, MD     MDM Rules/Calculators/A&P Medical Decision Making Given presenting complaint, I considered that admission might be necessary. After review of results from ED lab and/or imaging studies, admission to the hospital is not indicated at this time.    Problems Addressed: Atypical chest pain: acute illness or injury  Amount and/or Complexity of Data Reviewed Labs: ordered. Decision-making details documented in ED Course. Radiology: ordered and independent  interpretation performed. Decision-making details documented in ED Course. ECG/medicine tests: ordered and independent interpretation performed. Decision-making details documented in ED Course.  Risk Prescription drug management. Parenteral controlled substances. Decision regarding hospitalization.     Final Clinical Impression(s) / ED Diagnoses Final diagnoses:  Atypical chest pain    Rx / DC Orders ED Discharge Orders     None        Roselyn Dunnings NOVAK, MD 02/07/24 [phone]

## 2024-02-07 NOTE — ED Notes (Signed)
 Patient transported to X-ray

## 2024-02-13 ENCOUNTER — Encounter: Payer: Self-pay | Admitting: Orthopedic Surgery

## 2024-02-13 ENCOUNTER — Ambulatory Visit: Admitting: Orthopedic Surgery

## 2024-02-13 DIAGNOSIS — Z89612 Acquired absence of left leg above knee: Secondary | ICD-10-CM

## 2024-02-13 NOTE — Progress Notes (Signed)
 Office Visit Note   Patient: Mike Floyd           Date of Birth: [birth year]           MRN: [id number] Visit Date: 02/13/2024              Requested by: Bertell Satterfield, MD [address] [place],  [place] [address] PCP: Bertell Satterfield, MD  Chief Complaint  Patient presents with   Left Leg - Follow-up    AKA   Right Foot - Wound Check      HPI: Patient is a 63 year old gentleman is 6 months status post left above-knee amputation.  Patient has started his prosthetic fitting process.  Assessment & Plan: Visit Diagnoses:  1. Hx of AKA (above knee amputation), left (HCC)     Plan: A prescription was provided for physical therapy upstairs.  Patient's orthotic was modified for the right foot.  Follow-Up Instructions: No follow-ups on file.   Ortho Exam  Patient is alert, oriented, no adenopathy, well-dressed, normal affect, normal respiratory effort. Examination patient has a well-healed residual limb on the left.  No swelling he will continue with the prosthetic fitting.  Patient does have some callus beneath the metatarsal heads on the right foot.  This was pared there is no full-thickness wound.  Patient has custom orthotics and these were modified to cut out the areas of pressure beneath the metatarsal heads.  Patient is a new left transfemoral amputee.  Patient's current comorbidities are not expected to impact the ability to function with the prescribed prosthesis. Patient verbally communicates a strong desire to use a prosthesis. Patient currently requires mobility aids to ambulate without a prosthesis.  Expects not to use mobility aids with a new prosthesis. Patient is expected to resume or reach their K Level within 6 months. Patient was active before the amputation and independent with stairs, uneven terrain, varying cadence, and a community ambulator.  Patient is a K2 level ambulator that will use a prosthesis to walk around their home and the community over  low level environmental barriers.      Imaging: No results found. No images are attached to the encounter.  Labs: Lab Results  Component Value Date   HGBA1C 7.7 (H) 06/17/2023   HGBA1C 9.7 (H) 05/13/2022   HGBA1C 7.0 (H) 06/15/2017   ESRSEDRATE 2 03/14/2023   CRP <1 03/14/2023   CRP 0.6 05/16/2022   CRP 2.2 06/27/2019   LABURIC 5.5 03/07/2012   REPTSTATUS 05/18/2022 FINAL 05/13/2022   CULT  05/13/2022    NO GROWTH 5 DAYS Performed at [facility], [address] [place], [place] [address]      Lab Results  Component Value Date   ALBUMIN  4.0 06/17/2023   ALBUMIN  4.4 05/04/2023   ALBUMIN  4.1 02/26/2023   PREALBUMIN 23 06/17/2023    Lab Results  Component Value Date   MG 1.8 02/26/2023   MG 1.7 05/20/2022   MG 2.0 05/19/2022   Lab Results  Component Value Date   VD25OH 18 (L) 06/15/2017    Lab Results  Component Value Date   PREALBUMIN 23 06/17/2023      Latest Ref Rng & Units 02/06/2024   11:59 PM 11/02/2023   10:58 PM 08/19/2023    8:08 AM  CBC EXTENDED  WBC 4.0 - 10.5 K/uL 5.2  4.5  6.4   RBC 4.22 - 5.81 MIL/uL 4.91  4.75  5.37   Hemoglobin 13.0 - 17.0 g/dL 85.6  87.0  84.4  HCT 39.0 - 52.0 % 40.8  37.9  45.3   Platelets 150 - 400 K/uL 134  181  138      There is no height or weight on file to calculate BMI.  Orders:  Orders Placed This Encounter  Procedures   Ambulatory referral to Physical Therapy   No orders of the defined types were placed in this encounter.    Procedures: No procedures performed  Clinical Data: No additional findings.  ROS:  All other systems negative, except as noted in the HPI. Review of Systems  Objective: Vital Signs: There were no vitals taken for this visit.  Specialty Comments:  No specialty comments available.  PMFS History: Patient Active Problem List   Diagnosis Date Noted   Dyslipidemia 11/03/2023   Essential hypertension 11/03/2023   Type 2 diabetes mellitus with peripheral neuropathy  (HCC) 11/03/2023   BPH (benign prostatic hyperplasia) 11/03/2023   GERD without esophagitis 11/03/2023   S/P AKA (above knee amputation) unilateral, left (HCC) 08/19/2023   Dehiscence of amputation stump of left lower extremity (HCC) 07/22/2023   Ischemia of site of left below knee amputation (HCC) 07/22/2023   S/P BKA (below knee amputation) unilateral, left (HCC) 07/22/2023   Below-knee amputation of left lower extremity (HCC) 06/17/2023   Acute osteomyelitis of metatarsal bone of left foot (HCC)    Subacute osteomyelitis, left ankle and foot (HCC) 05/13/2022   Non-pressure chronic ulcer of other part of right foot limited to breakdown of skin (HCC)    Contracture of right Achilles tendon    Mildly dilatd aortic root (HCC)    Contracture of left Achilles tendon    Metatarsal deformity, left    CAD (coronary artery disease)    Hallux hammertoe, left 12/11/2019   Diarrhea 06/27/2019   Diastasis recti 08/22/2018   Umbilical hernia without obstruction and without gangrene 08/22/2018   Vitamin D  deficiency 06/23/2017   Essential hypertension, benign 06/15/2017   Mixed hyperlipidemia 06/15/2017   Class 1 obesity due to excess calories with serious comorbidity and body mass index (BMI) of 31.0 to 31.9 in adult 06/15/2017   Malfunction of implantable cardioverter-defibrillator (ICD) electrode 10/07/2016   Chronic kidney disease (CKD), stage III (moderate) (HCC) 04/04/2015   Bladder neck obstruction 03/21/2015   Difficult or painful urination 03/21/2015   Flank pain 03/21/2015   Delayed onset of urination 03/21/2015   Calculus of kidney 03/21/2015   Erosive esophagitis 12/27/2013   Hypotension due to drugs 07/02/2013   Chronic systolic heart failure (HCC) 04/24/2013   CAP (community acquired pneumonia) 03/16/2013   HTN (hypertension) 03/15/2013   Hydrocele 03/15/2013   Spermatocele 03/15/2013   DOE (dyspnea on exertion) 03/15/2013   OSA (obstructive sleep apnea) 01/09/2013   Chronic  kidney disease, stage 3 (HCC) 01/03/2013   Chest pain 12/26/2012   History of diagnostic tests 09/06/2012   TIA (transient ischemic attack)    Cardiomyopathy, nonischemic (HCC) 02/24/2010   AICD (automatic cardioverter/defibrillator) 02/24/2010   DM type 2 causing vascular disease (HCC) 06/27/2009   Gout 06/27/2009   Tobacco abuse, in remission 06/27/2009   COLONIC POLYPS 10/31/2008   GERD (gastroesophageal reflux disease) 10/31/2008   Past Medical History:  Diagnosis Date   AICD (automatic cardioverter/defibrillator) present 11/2007   Removed in 2018, a. 11/2007 S[facility] Current VR - single lead ICD  - Removed 2018 - it was burning me   Anxiety    CAD (coronary artery disease)    non-obstructive CAD by Cor CT in 2020 // Myoview   3/22: EF 57, small inf-sept defect c/w scar, no ischemia; low risk      Chest pain    a. 10/2007 Cath:  normal Cors.   CKD (chronic kidney disease), stage II    DDD (degenerative disc disease), lumbar    Diabetes mellitus DX: 2010   type 2   Erosive esophagitis    a. per EGD (08/2011), Dr. Golda - Erosive reflux esophagitis improved but not completely healed since previous EGD 3 years ago. Bx showing  ulcerated gatroesophageal junction mucosa. negative for H. pylori   GERD (gastroesophageal reflux disease)    Gout    Hearing deficit    a. wear bilateral hearing aides   History of hiatal hernia    History of kidney stones    passed stones, no surgery   Hypertension    Mildly dilatd aortic root (HCC)    CMR 4/22: EF 52, no LGE; d/w Dr. Ione root 38 mm (mildly dilated)   Myocardial infarction [facility]) 2011   Neuropathy    Feet and legs   Nonischemic dilated cardiomyopathy (HCC)    a. H/O EF as low as 35-40% by LV gram 10/2007;  b. Echo 02/2011 EF 50-55%, inf HK, Gr 1 DD // CMR 4/22: EF 52, no LGE; d/w Dr. Ione root 38 mm (mildly dilated)    Renal insufficiency    Sleep apnea    pt doesnt use, statesI cant afford one. PCP aware   Stroke (HCC)     mini-stroke in 2014   TIA (transient ischemic attack)    July, 2013   Tobacco abuse, in remission 06/27/2009   Discontinued in 2009     Wears dentures    top plate   WPW (Wolff-Parkinson-White syndrome)    a. s/p RFCA @ [facility] - 1999    Family History  Problem Relation Age of Onset   Heart attack Mother 45   Hypertension Mother    Diabetes Mother    Kidney disease Mother    Breast cancer Mother    Heart attack Father 60   Hypertension Father    Diabetes Father    Stroke Brother    Heart attack Brother 104   Stroke Maternal Grandmother 74   Diabetes Brother    Hypertension Brother     Past Surgical History:  Procedure Laterality Date   AMPUTATION Left 05/19/2022   Procedure: TRANSMETATARSAL AMPUTATION LEFT FOOT;  Surgeon: Harden Jerona GAILS, MD;  Location: [facility] OR;  Service: Orthopedics;  Laterality: Left;   AMPUTATION Left 06/17/2023   Procedure: LEFT BELOW KNEE AMPUTATION;  Surgeon: Harden Jerona GAILS, MD;  Location: [facility] OR;  Service: Orthopedics;  Laterality: Left;   AMPUTATION Left 08/19/2023   Procedure: LEFT ABOVE KNEE AMPUTATION;  Surgeon: Harden Jerona GAILS, MD;  Location: [facility] OR;  Service: Orthopedics;  Laterality: Left;   APPENDECTOMY     BACK SURGERY     1995   CARDIAC CATHETERIZATION     2009   CARDIAC DEFIBRILLATOR PLACEMENT     ICD was removed in 2018   CHOLECYSTECTOMY     CHOLECYSTECTOMY, LAPAROSCOPIC     11/2007   COLONOSCOPY W/ POLYPECTOMY  2009   ELBOW SURGERY Left 06/2010   ESOPHAGEAL DILATION N/A 11/08/2014   Procedure: ESOPHAGEAL DILATION;  Surgeon: Claudis RAYMOND Golda, MD;  Location: [facility] ORS;  Service: Endoscopy;  Laterality: N/A;  #56,    ESOPHAGOGASTRODUODENOSCOPY  03/31/2012   also 08/2011; [name]   ESOPHAGOGASTRODUODENOSCOPY (EGD) WITH PROPOFOL  N/A 11/08/2014   Procedure: ESOPHAGOGASTRODUODENOSCOPY (  EGD) WITH PROPOFOL ;  Surgeon: Claudis RAYMOND Rivet, MD;  Location: [facility] ORS;  Service: Endoscopy;  Laterality: N/A;  [place] is 65 , [place] is 35   FOOT ARTHRODESIS Left  10/24/2020   Procedure: LEFT GASTROCNEMIUS RECESSION, DORSIFLEXION OSTEOTOMY 1ST MT;  Surgeon: Harden Jerona GAILS, MD;  Location: [facility] OR;  Service: Orthopedics;  Laterality: Left;   FOOT ARTHRODESIS Right 01/09/2021   Procedure: CLOSING WEDGE OSTEOTOMY RIGHT 1ST METATARSAL;  Surgeon: Harden Jerona GAILS, MD;  Location: [facility] OR;  Service: Orthopedics;  Laterality: Right;   GASTROCNEMIUS RECESSION Right 01/09/2021   Procedure: RIGHT GASTROCNEMIUS RECESSION;  Surgeon: Harden Jerona GAILS, MD;  Location[facility] OR;  Service: Orthopedics;  Laterality: Right;   ICD LEAD REMOVAL N/A 10/07/2016   Procedure: ICD LEAD REMOVAL ;  Surgeon: Danelle LELON Birmingham, MD;  Location: [facility] OR;  Service: Cardiovascular;  Laterality: N/A;   MULTIPLE TOOTH EXTRACTIONS     RADIOFREQUENCY ABLATION  1999   WPW; performed at [facility]   STUMP REVISION Left 07/22/2023   Procedure: REVISION LEFT BELOW KNEE AMPUTATION;  Surgeon: Harden Jerona GAILS, MD;  Location: [facility] OR;  Service: Orthopedics;  Laterality: Left;   TEE WITHOUT CARDIOVERSION N/A 10/07/2016   Procedure: TRANSESOPHAGEAL ECHOCARDIOGRAM (TEE);  Surgeon: Danelle LELON Birmingham, MD;  Location: [facility] OR;  Service: Cardiovascular;  Laterality: N/A;   Social History   Occupational History   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    Comment: Laid off in 11/11  Tobacco Use   Smoking status: Former    Current packs/day: 0.00    Average packs/day: 1.5 packs/day for 39.1 years (58.6 ttl pk-yrs)    Types: Cigarettes    Start date: 06/30/1971    Quit date: 07/30/2010    Years since quitting: 13.5   Smokeless tobacco: Former    Types: Chew    Quit date: 03/26/1994  Vaping Use   Vaping status: Former   Start date: 07/30/2010   Quit date: 06/29/2016  Substance and Sexual Activity   Alcohol use: No    Alcohol/week: 1.0 standard drink of alcohol    Types: 1 Cans of beer per week    Comment: used years ago   Drug use: No   Sexual activity: Yes    Birth control/protection: None

## 2024-02-17 DIAGNOSIS — M86172 Other acute osteomyelitis, left ankle and foot: Secondary | ICD-10-CM | POA: Diagnosis not present

## 2024-02-20 DIAGNOSIS — Z89612 Acquired absence of left leg above knee: Secondary | ICD-10-CM | POA: Diagnosis not present

## 2024-02-27 ENCOUNTER — Telehealth: Payer: Self-pay | Admitting: Cardiovascular Disease

## 2024-02-27 MED ORDER — CARVEDILOL 3.125 MG PO TABS: 3.1250 mg | ORAL_TABLET | Freq: Two times a day (BID) | ORAL | 0 refills | Status: DC

## 2024-02-27 NOTE — Telephone Encounter (Signed)
*  STAT* If patient is at the pharmacy, call can be transferred to refill team.   1. Which medications need to be refilled? (please list name of each medication and dose if known) carvedilol  (COREG ) 3.125 MG tablet   2. Which pharmacy/location (including street and city if local pharmacy) is medication to be sent to? [facility] - [place], [place] - [id number]    3. Do they need a 30 day or 90 day supply? 90

## 2024-02-27 NOTE — Telephone Encounter (Signed)
 RX sent to requested Pharmacy

## 2024-02-28 DIAGNOSIS — E1129 Type 2 diabetes mellitus with other diabetic kidney complication: Secondary | ICD-10-CM | POA: Diagnosis not present

## 2024-02-28 DIAGNOSIS — N1831 Chronic kidney disease, stage 3a: Secondary | ICD-10-CM | POA: Diagnosis not present

## 2024-02-28 DIAGNOSIS — I10 Essential (primary) hypertension: Secondary | ICD-10-CM | POA: Diagnosis not present

## 2024-02-28 DIAGNOSIS — G894 Chronic pain syndrome: Secondary | ICD-10-CM | POA: Diagnosis not present

## 2024-03-05 ENCOUNTER — Ambulatory Visit: Admitting: Physical Therapy

## 2024-03-05 ENCOUNTER — Encounter: Payer: Self-pay | Admitting: Physical Therapy

## 2024-03-05 DIAGNOSIS — L98498 Non-pressure chronic ulcer of skin of other sites with other specified severity: Secondary | ICD-10-CM

## 2024-03-05 DIAGNOSIS — R2689 Other abnormalities of gait and mobility: Secondary | ICD-10-CM | POA: Diagnosis not present

## 2024-03-05 DIAGNOSIS — M25652 Stiffness of left hip, not elsewhere classified: Secondary | ICD-10-CM

## 2024-03-05 DIAGNOSIS — R293 Abnormal posture: Secondary | ICD-10-CM | POA: Diagnosis not present

## 2024-03-05 DIAGNOSIS — M6281 Muscle weakness (generalized): Secondary | ICD-10-CM | POA: Diagnosis not present

## 2024-03-05 DIAGNOSIS — M79605 Pain in left leg: Secondary | ICD-10-CM

## 2024-03-05 DIAGNOSIS — R2681 Unsteadiness on feet: Secondary | ICD-10-CM | POA: Diagnosis not present

## 2024-03-05 NOTE — Therapy (Addendum)
 OUTPATIENT PHYSICAL THERAPY PROSTHETIC EVALUATION   Patient Name: Mike Floyd MRN: [id number] DOB:Jun 03, 1961, 63 y.o., male Today's Date: 03/05/2024  END OF SESSION:  PT End of Session - 03/05/24 1608     Visit Number 1    Number of Visits 25    Date for PT Re-Evaluation 05/31/24    Authorization Type [employer] MEDICARE    Authorization Time Period $45 copay    PT Start Time 1145    PT Stop Time 1230    PT Time Calculation (min) 45 min    Equipment Utilized During Treatment Gait belt    Activity Tolerance Patient tolerated treatment well;Patient limited by pain    Behavior During Therapy WFL for tasks assessed/performed          Past Medical History:  Diagnosis Date   AICD (automatic cardioverter/defibrillator) present 11/2007   Removed in 2018, a. 11/2007 S[facility] Current VR - single lead ICD  - Removed 2018 - it was burning me   Anxiety    CAD (coronary artery disease)    non-obstructive CAD by Cor CT in 2020 // Myoview  3/22: EF 57, small inf-sept defect c/w scar, no ischemia; low risk      Chest pain    a. 10/2007 Cath:  normal Cors.   CKD (chronic kidney disease), stage II    DDD (degenerative disc disease), lumbar    Diabetes mellitus DX: 2010   type 2   Erosive esophagitis    a. per EGD (08/2011), Dr. Golda - Erosive reflux esophagitis improved but not completely healed since previous EGD 3 years ago. Bx showing  ulcerated gatroesophageal junction mucosa. negative for H. pylori   GERD (gastroesophageal reflux disease)    Gout    Hearing deficit    a. wear bilateral hearing aides   History of hiatal hernia    History of kidney stones    passed stones, no surgery   Hypertension    Mildly dilatd aortic root (HCC)    CMR 4/22: EF 52, no LGE; d/w Dr. Ione root 38 mm (mildly dilated)   Myocardial infarction [facility]) 2011   Neuropathy    Feet and legs   Nonischemic dilated cardiomyopathy (HCC)    a. H/O EF as low as 35-40% by LV gram 10/2007;  b. Echo 02/2011 EF 50-55%,  inf HK, Gr 1 DD // CMR 4/22: EF 52, no LGE; d/w Dr. Ione root 38 mm (mildly dilated)    Renal insufficiency    Sleep apnea    pt doesnt use, statesI cant afford one. PCP aware   Stroke (HCC)    mini-stroke in 2014   TIA (transient ischemic attack)    July, 2013   Tobacco abuse, in remission 06/27/2009   Discontinued in 2009     Wears dentures    top plate   WPW (Wolff-Parkinson-White syndrome)    a. s/p RFCA @ [facility] - 1999   Past Surgical History:  Procedure Laterality Date   AMPUTATION Left 05/19/2022   Procedure: TRANSMETATARSAL AMPUTATION LEFT FOOT;  Surgeon: Harden Jerona GAILS, MD;  Location: [facility] OR;  Service: Orthopedics;  Laterality: Left;   AMPUTATION Left 06/17/2023   Procedure: LEFT BELOW KNEE AMPUTATION;  Surgeon: Harden Jerona GAILS, MD;  Location: [facility] OR;  Service: Orthopedics;  Laterality: Left;   AMPUTATION Left 08/19/2023   Procedure: LEFT ABOVE KNEE AMPUTATION;  Surgeon: Harden Jerona GAILS, MD;  Location: [facility] OR;  Service: Orthopedics;  Laterality: Left;   APPENDECTOMY     BACK  SURGERY     1995   CARDIAC CATHETERIZATION     2009   CARDIAC DEFIBRILLATOR PLACEMENT     ICD was removed in 2018   CHOLECYSTECTOMY     CHOLECYSTECTOMY, LAPAROSCOPIC     11/2007   COLONOSCOPY W/ POLYPECTOMY  2009   ELBOW SURGERY Left 06/2010   ESOPHAGEAL DILATION N/A 11/08/2014   Procedure: ESOPHAGEAL DILATION;  Surgeon: Claudis RAYMOND Rivet, MD;  Location: [facility] ORS;  Service: Endoscopy;  Laterality: N/A;  #56,    ESOPHAGOGASTRODUODENOSCOPY  03/31/2012   also 08/2011; [name]   ESOPHAGOGASTRODUODENOSCOPY (EGD) WITH PROPOFOL  N/A 11/08/2014   Procedure: ESOPHAGOGASTRODUODENOSCOPY (EGD) WITH PROPOFOL ;  Surgeon: Claudis RAYMOND Rivet, MD;  Location: [facility] ORS;  Service: Endoscopy;  Laterality: N/A;  Hiatus is 48 , GE Junction is 37   FOOT ARTHRODESIS Left 10/24/2020   Procedure: LEFT GASTROCNEMIUS RECESSION, DORSIFLEXION OSTEOTOMY 1ST MT;  Surgeon: Harden Jerona GAILS, MD;  Location: [facility] OR;  Service: Orthopedics;   Laterality: Left;   FOOT ARTHRODESIS Right 01/09/2021   Procedure: CLOSING WEDGE OSTEOTOMY RIGHT 1ST METATARSAL;  Surgeon: Harden Jerona GAILS, MD;  Location: [facility] OR;  Service: Orthopedics;  Laterality: Right;   GASTROCNEMIUS RECESSION Right 01/09/2021   Procedure: RIGHT GASTROCNEMIUS RECESSION;  Surgeon: Harden Jerona GAILS, MD;  Location: [facility] OR;  Service: Orthopedics;  Laterality: Right;   ICD LEAD REMOVAL N/A 10/07/2016   Procedure: ICD LEAD REMOVAL ;  Surgeon: Danelle LELON Birmingham, MD;  Location: [facility] OR;  Service: Cardiovascular;  Laterality: N/A;   MULTIPLE TOOTH EXTRACTIONS     RADIOFREQUENCY ABLATION  1999   WPW; performed at [facility]   STUMP REVISION Left 07/22/2023   Procedure: REVISION LEFT BELOW KNEE AMPUTATION;  Surgeon: Harden Jerona GAILS, MD;  Location: [facility] OR;  Service: Orthopedics;  Laterality: Left;   TEE WITHOUT CARDIOVERSION N/A 10/07/2016   Procedure: TRANSESOPHAGEAL ECHOCARDIOGRAM (TEE);  Surgeon: Danelle LELON Birmingham, MD;  Location: [facility] OR;  Service: Cardiovascular;  Laterality: N/A;   Patient Active Problem List   Diagnosis Date Noted   Dyslipidemia 11/03/2023   Essential hypertension 11/03/2023   Type 2 diabetes mellitus with peripheral neuropathy (HCC) 11/03/2023   BPH (benign prostatic hyperplasia) 11/03/2023   GERD without esophagitis 11/03/2023   S/P AKA (above knee amputation) unilateral, left (HCC) 08/19/2023   Dehiscence of amputation stump of left lower extremity (HCC) 07/22/2023   Ischemia of site of left below knee amputation (HCC) 07/22/2023   S/P BKA (below knee amputation) unilateral, left (HCC) 07/22/2023   Below-knee amputation of left lower extremity (HCC) 06/17/2023   Acute osteomyelitis of metatarsal bone of left foot (HCC)    Subacute osteomyelitis, left ankle and foot (HCC) 05/13/2022   Non-pressure chronic ulcer of other part of right foot limited to breakdown of skin (HCC)    Contracture of right Achilles tendon    Mildly dilatd aortic root (HCC)    Contracture of  left Achilles tendon    Metatarsal deformity, left    CAD (coronary artery disease)    Hallux hammertoe, left 12/11/2019   Diarrhea 06/27/2019   Diastasis recti 08/22/2018   Umbilical hernia without obstruction and without gangrene 08/22/2018   Vitamin D  deficiency 06/23/2017   Essential hypertension, benign 06/15/2017   Mixed hyperlipidemia 06/15/2017   Class 1 obesity due to excess calories with serious comorbidity and body mass index (BMI) of 31.0 to 31.9 in adult 06/15/2017   Malfunction of implantable cardioverter-defibrillator (ICD) electrode 10/07/2016   Chronic kidney disease (CKD), stage III (moderate) (HCC)  04/04/2015   Bladder neck obstruction 03/21/2015   Difficult or painful urination 03/21/2015   Flank pain 03/21/2015   Delayed onset of urination 03/21/2015   Calculus of kidney 03/21/2015   Erosive esophagitis 12/27/2013   Hypotension due to drugs 07/02/2013   Chronic systolic heart failure (HCC) 04/24/2013   CAP (community acquired pneumonia) 03/16/2013   HTN (hypertension) 03/15/2013   Hydrocele 03/15/2013   Spermatocele 03/15/2013   DOE (dyspnea on exertion) 03/15/2013   OSA (obstructive sleep apnea) 01/09/2013   Chronic kidney disease, stage 3 (HCC) 01/03/2013   Chest pain 12/26/2012   History of diagnostic tests 09/06/2012   TIA (transient ischemic attack)    Cardiomyopathy, nonischemic (HCC) 02/24/2010   AICD (automatic cardioverter/defibrillator) 02/24/2010   DM type 2 causing vascular disease (HCC) 06/27/2009   Gout 06/27/2009   Tobacco abuse, in remission 06/27/2009   COLONIC POLYPS 10/31/2008   GERD (gastroesophageal reflux disease) 10/31/2008    PCP: Bertell Satterfield, MD  REFERRING PROVIDER: Harden Jerona GAILS, MD  REFERRING DIAG: [phone] (ICD-10-CM) - Hx of AKA (above knee amputation), left (HCC)   THERAPY DIAG:  Other abnormalities of gait and mobility  Muscle weakness (generalized)  Pain in left leg  Stiffness of left hip, not elsewhere  classified  Abnormal posture  Unsteadiness on feet  Non-pressure chronic ulcer of skin of other sites with other specified severity [facility])  Rationale for Evaluation and Treatment: Rehabilitation  ONSET DATE: Prosthesis delivery 02/20/2024  SUBJECTIVE:   SUBJECTIVE STATEMENT: Patient is a 63 year old male who presents to physical therapy with a left AKA (08/19/2023) with a new prosthesis delivery on 02/20/2024. Patient had previous home health PT prior to Lt AKA. He is able to mobilize with modified independence w/ walker. Wears prosthesis about ten hours a day and by the end of the night he takes it off due to fatigue and pain.   PERTINENT HISTORY: DM2, GERD, hypertension, dyslipidemia, CAD, L BKA (06/17/23), neuropathy  PAIN:  Are you having pain? Yes: NPRS scale: 8-10/10 in the past week, none at rest during session, previous back pain is the same post amputation Pain location: Femur in prosthesis and pain in the Rt leg as well Pain description: feels like its been hammered Aggravating factors: keeping prosthesis on for long periods of times, sitting Relieving factors: taking off prosthesis and medications  PRECAUTIONS: Fall  RED FLAGS: None   WEIGHT BEARING RESTRICTIONS: No  FALLS:  Has patient fallen in last 6 months? Yes. Number of falls 2, forgetting he doesn't have a leg and put weight onto the left side. He did not hit the floor, only fell into another person. Occurred when transferring from sitting to standing  LIVING ENVIRONMENT: Lives with: lives with their spouse Lives in: Other Trailer on grass Stairs: Yes: External: 4 steps; can reach both Has following equipment at home: Single point cane, Environmental consultant - 2 wheeled, Wheelchair (manual), and shower chair  OCCUPATION: retired   PLOF: Independent with household mobility with device  PATIENT GOALS: ambulating and ADLs with cane   NEXT MD VISIT: Office MD visit: 03/21/2024   OBJECTIVE:  Patient-Specific Activity  Scoring Scheme  0 represents "unable to perform." 10 represents "able to perform at prior level. 0 1 2 3 4 5 6 7 8 9  10 (Date and Score)   Activity Eval     1. Walking w/ cane  2    2. Standing for ADLs  5    3. Stairs 5   4.  5.    Score 4    Total score = sum of the activity scores/number of activities Minimum detectable change (90%CI) for average score = 2 points Minimum detectable change (90%CI) for single activity score = 3 points  DIAGNOSTIC FINDINGS: none post-amputation  COGNITION: Overall cognitive status: Within functional limits for tasks assessed   SENSATION: WFL  MUSCLE LENGTH:   POSTURE: rounded shoulders, forward head, flexed trunk , and weight shift right  LOWER EXTREMITY ROM:  ROM P:passive  A:active Left eval  Hip flexion   Hip extension Thomas position: P: - 40*  Hip abduction   Hip adduction   Hip internal rotation   Hip external rotation   Knee flexion   Knee extension   Ankle dorsiflexion   Ankle plantarflexion   Ankle inversion   Ankle eversion    (Blank rows = not tested)  LOWER EXTREMITY MMT:  MMT Right eval Left eval  Hip flexion 3/5 3/5  Hip extension 3/5 3-/5  Hip abduction  3-/5  Hip adduction    Hip internal rotation    Hip external rotation    Knee flexion    Knee extension 3/5   Ankle dorsiflexion    Ankle plantarflexion    Ankle inversion    Ankle eversion    At Evaluation all strength testing is grossly seated and functionally standing / gait. (Blank rows = not tested)  BED MOBILITY:  Sit to supine Complete Independence Supine to sit Complete Independence Rolling to Right Complete Independence  TRANSFERS: Sit to stand: supervision requires a stable object to arise, stabilization w/ walker using UE support, with delayed weight shift onto the prosthesis for stabilization, and uses UE to bring Lt prosthesis back below his body  Stand to sit: supervision requires a stable object to arise, stabilization  w/ walker using UE support, with delayed weight shift onto the prosthesis for stabilization  FUNCTIONAL TESTs:  BERG score 11/56  [facility] PT Assessment - 03/05/24 1145       Berg Balance Test   Sit to Stand Needs minimal aid to stand or to stabilize    Standing Unsupported Needs several tries to stand 30 seconds unsupported    Sitting with Back Unsupported but Feet Supported on Floor or Stool Able to sit safely and securely 2 minutes    Stand to Sit Needs assistance to sit    Transfers Able to transfer safely, definite need of hands    Standing Unsupported with Eyes Closed Needs help to keep from falling    Standing Unsupported with Feet Together Needs help to attain position and unable to hold for 15 seconds    From Standing, Reach Forward with Outstretched Arm Loses balance while trying/requires external support    From Standing Position, Pick up Object from Floor Unable to try/needs assist to keep balance    From Standing Position, Turn to Look Behind Over each Shoulder Needs supervision when turning    Turn 360 Degrees Needs assistance while turning    Standing Unsupported, Alternately Place Feet on Step/Stool Needs assistance to keep from falling or unable to try    Standing Unsupported, One Foot in [employer] balance while stepping or standing    Standing on One Leg Tries to lift leg/unable to hold 3 seconds but remains standing independently    Total Score 11    Berg comment: BERG  < 36 high risk for falls (close to 100%) 46-51 moderate (>50%)   37-45 significant (>80%) 52-55 lower (> 25%)  needs external support RW with most BERG tasks          GAIT: Gait pattern: step to pattern, decreased step length- Right, decreased stance time- Left, decreased hip/knee flexion- Left, circumduction- Left, Left hip hike, antalgic, trendelenburg, lateral lean- Right, trunk flexed, abducted- Left, and poor foot clearance- Left Distance walked: 62 ft Assistive device utilized: Walker - 2  wheeled and AKA prosthesis Level of assistance: SBA / steady with RW support but needs cues  Gait velocity: 0.72 ft/sec Comments: excessive weightbearing on BUEs using RW  RAMP : not tested  CURB: not tested  CURRENT PROSTHETIC WEAR ASSESSMENT:   Patient is dependent with: skin check, residual limb care, care of non-amputated limb, prosthetic cleaning, ply sock cleaning, correct ply sock adjustment, proper wear schedule/adjustment, and proper weight-bearing schedule/adjustment Donning prosthesis: SBA / verbal cues Doffing prosthesis: Complete Independence Prosthetic wear tolerance: 6-8 hours, 1x/day, 7 days/week Prosthetic weight bearing tolerance: tolerated standing for 10 minutes with partial weight on prosthesis during BERG balance test with no increased pain. Although he reports standing pain with activities at home. Edema: minimal Residual limb condition: 3x35mm superficial wound distally located, scar with mild adherence, no adductor roll, normal skin color, decreased hair growth present Prosthetic description: silicone liner with suction ring suspension, ischial containment socket with flexible inner socket, rotator unit, SAFETY (single axis friction engaging with an extension assist), flexible keel foot    TODAY'S TREATMENT:                                                                                                                             DATE: 03/05/2024  Prosthetic Care with AKA prosthesis: See patient education below.  PATIENT EDUCATION: PATIENT EDUCATED ON FOLLOWING PROSTHETIC CARE: Education details: Sitting positioning to limit skin traction from weight of prosthesis, how hip flexion contracture effects residual limb pain & posture,  Prosthetic cleaning and Propper donning  Person educated: Patient, Spouse, and close family friend Education method: Explanation, Demonstration, Tactile cues, and Verbal cues Education comprehension: verbalized understanding and needs  further education  HOME EXERCISE PROGRAM:  ASSESSMENT:  CLINICAL IMPRESSION: Patient is a 63 y.o. male who was seen today for physical therapy evaluation and treatment for prosthetic training with Lt AKA. Patient has significant deconditioning including weakness, endurance, and flexibility. Patient is dependent in prosthetic care and has limited wear in prosthesis. Patient has residual limb pain, wound on residual limb and a hip flexion contracture. Patient has impaired posture. Patient BERG balance indicates high risk of falls and dependency on standing ADLs. Patient's gait is dependent on RW and has deviations indicating risk of falls. Patient will benefit from skilled PT to improve function and safety with his AKA prosthesis.  OBJECTIVE IMPAIRMENTS: Abnormal gait, decreased activity tolerance, decreased balance, decreased endurance, decreased knowledge of condition, decreased knowledge of use of DME, decreased mobility, difficulty walking, decreased ROM, decreased strength, impaired flexibility, postural dysfunction, prosthetic dependency , and pain.   ACTIVITY LIMITATIONS: carrying,  lifting, bending, sitting, standing, squatting, stairs, transfers, bathing, toileting, dressing, reach over head, and locomotion level  PARTICIPATION LIMITATIONS: meal prep, cleaning, laundry, driving, shopping, community activity, and yard work  PERSONAL FACTORS: Age, Fitness, Past/current experiences, Time since onset of injury/illness/exacerbation, and 3+ comorbidities: see PMH are also affecting patient's functional outcome.   REHAB POTENTIAL: Good  CLINICAL DECISION MAKING: Evolving/moderate complexity  EVALUATION COMPLEXITY: Moderate   GOALS: Goals reviewed with patient? Yes  SHORT TERM GOALS: Target date: 04/05/2024  Patient donnes prosthesis modified independent & verbalizes proper cleaning. Baseline: SEE OBJECTIVE DATA Goal status: INITIAL  2.  Patient tolerates prosthesis >12 hrs total /day  without skin issues or limb pain >7/10 after standing. Baseline: SEE OBJECTIVE DATA Goal status: INITIAL  3.  Patient able to reach 7 and look over both shoulders with RW support with Modified independent. Baseline: SEE OBJECTIVE DATA Goal status: INITIAL  4. Patient ambulates 125' with RW & prosthesis with supervision. Baseline: SEE OBJECTIVE DATA Goal status: INITIAL  5. Patient negotiates ramps & curbs with RW & prosthesis with supervision. Baseline: SEE OBJECTIVE DATA Goal status: INITIAL  LONG TERM GOALS: Target date: 05/31/2024  Patient demonstrates & verbalized understanding of prosthetic care to enable safe utilization of prosthesis. Baseline: SEE OBJECTIVE DATA Goal status: INITIAL  Patient tolerates prosthesis wear >90% of awake hours without skin issues or limb pain >4/10. Baseline: SEE OBJECTIVE DATA Goal status: INITIAL  BERG >/= 25/56 to indicate lower fall risk Baseline: SEE OBJECTIVE DATA Goal status: INITIAL  Patient ambulates >300' with prosthesis and LRAD independently Baseline: SEE OBJECTIVE DATA Goal status: INITIAL  Patient negotiates ramps, curbs & stairs with single rail with prosthesis and LRAD independently. Baseline: SEE OBJECTIVE DATA Goal status: INITIAL  6.  Patient reports Patient-Specific Activity Score improved the average by 3 to indicate improvement in functional activities.   Baseline: SEE OBJECTIVE DATA Goal status: INITIAL  PLAN:  PT FREQUENCY: 2x/week  PT DURATION: 12 weeks  PLANNED INTERVENTIONS: [id number]- PT Re-evaluation, 97750- Physical Performance Testing, 97110-Therapeutic exercises, 97530- Therapeutic activity, 97112- Neuromuscular re-education, 97535- Self Care, [id number]- Manual therapy, [phone]- Gait training, [phone]- Prosthetic Initial , [phone]- Orthotic/Prosthetic subsequent, Patient/Family education, Balance training, Stair training, Joint mobilization, and DME instructions  PLAN FOR NEXT SESSION: Review and update prosthetic  care, educate on wear, initiate HEP on hip flexion contracture and balance, and prosthetic gait training w/ RW   Ismael Nap, Student-PT, DPT 03/05/2024, 4:53 PM  This entire session of physical therapy was performed under the direct supervision of PT signing evaluation /treatment. PT reviewed note and agrees.   Grayce Spatz, PT, DPT 03/05/2024, 5:24 PM     Date of referral: 02/13/2024 Referring provider: Harden Jerona GAILS, MD Referring diagnosis? S10.387 (ICD-10-CM) - Hx of AKA (above knee amputation), left (HCC)  Treatment diagnosis? (if different than referring diagnosis) R26.89, M62.81, M79.605, M25.652, R29.3, R26.81, L98.498  What was this (referring dx) caused by? Other: Lt AKA  Nature of Condition: Initial Onset (within last 3 months)   Laterality: Lt  Current Functional Measure Score: Patient Specific Functional Scale Score:4  Objective measurements identify impairments when they are compared to normal values, the uninvolved extremity, and prior level of function.  [x]  Yes  []  No  Objective assessment of functional ability: Moderate functional limitations   Briefly describe symptoms: Patient is dependent in prosthetic care and use with high risk of falls. Gait deviation, muscle weakness, pain in Lt residual limb, hip flexion contracture.  How did symptoms start: amputation due to  vascular issues  Average pain intensity:  Last 24 hours: 8-9/10  Past week: 8-9/10  How often does the pt experience symptoms? Constantly  How much have the symptoms interfered with usual daily activities? Quite a bit  How has condition changed since care began at this facility? NA - initial visit  In general, how is the patients overall health? Good   BACK PAIN (STarT Back Screening Tool) No

## 2024-03-07 ENCOUNTER — Encounter: Admitting: Physical Therapy

## 2024-03-19 DIAGNOSIS — M86172 Other acute osteomyelitis, left ankle and foot: Secondary | ICD-10-CM | POA: Diagnosis not present

## 2024-03-20 ENCOUNTER — Ambulatory Visit: Admitting: Physical Therapy

## 2024-03-20 ENCOUNTER — Encounter: Payer: Self-pay | Admitting: Physical Therapy

## 2024-03-20 DIAGNOSIS — L98498 Non-pressure chronic ulcer of skin of other sites with other specified severity: Secondary | ICD-10-CM | POA: Diagnosis not present

## 2024-03-20 DIAGNOSIS — M79605 Pain in left leg: Secondary | ICD-10-CM | POA: Diagnosis not present

## 2024-03-20 DIAGNOSIS — R2681 Unsteadiness on feet: Secondary | ICD-10-CM

## 2024-03-20 DIAGNOSIS — R293 Abnormal posture: Secondary | ICD-10-CM | POA: Diagnosis not present

## 2024-03-20 DIAGNOSIS — M25652 Stiffness of left hip, not elsewhere classified: Secondary | ICD-10-CM

## 2024-03-20 DIAGNOSIS — M6281 Muscle weakness (generalized): Secondary | ICD-10-CM | POA: Diagnosis not present

## 2024-03-20 DIAGNOSIS — R2689 Other abnormalities of gait and mobility: Secondary | ICD-10-CM | POA: Diagnosis not present

## 2024-03-20 NOTE — Patient Instructions (Signed)
 Do each exercise 1-2  times per day Do each exercise 5-10 repetitions Hold each exercise for 2 seconds to feel your location  AT SINK FIND YOUR MIDLINE POSITION AND PLACE FEET EQUAL DISTANCE FROM THE MIDLINE.  Try to find this position when standing still for activities.   USE TAPE ON FLOOR TO MARK THE MIDLINE POSITION which is even with middle of sink.  You also should try to feel with your limb pressure in socket.  You are trying to feel with limb what you used to feel with the bottom of your foot.  Side to Side Shift: Moving your hips only (not shoulders): move weight onto your left leg, HOLD/FEEL pressure in socket.  Move back to equal weight on each leg, HOLD/FEEL pressure in socket. Move weight onto your right leg, HOLD/FEEL pressure in socket. Move back to equal weight on each leg, HOLD/FEEL pressure in socket. Repeat.  Start with both hands on sink, progress to hand on prosthetic side only, then no hands.  Front to Back Shift: Moving your hips only (not shoulders): move your weight forward onto your toes, HOLD/FEEL pressure in socket. Move your weight back to equal Flat Foot on both legs, HOLD/FEEL  pressure in socket. Move your weight back onto your heels, HOLD/FEEL  pressure in socket. Move your weight back to equal on both legs, HOLD/FEEL  pressure in socket. Repeat.  Start with both hands on sink, progress to hand on prosthetic side only, then no hands.  Moving Cones / Cups: With equal weight on each leg: Hold on with one hand the first time, then progress to no hand supports. Move cups from one side of sink to the other. Place cups ~2" out of your reach, progress to 10" beyond reach.  Place one hand in middle of sink and reach with other hand. Do both arms.  Then hover one hand and move cups with other hand.  Overhead/Upward Reaching: alternated reaching up to top cabinets or ceiling if no cabinets present. Keep equal weight on each leg. Start with one hand support on counter while other  hand reaches and progress to no hand support with reaching.  ace one hand in middle of sink and reach with other hand. Do both arms.  Then hover one hand and move cups with other hand.  5.   Looking Over Shoulders: With equal weight on each leg: alternate turning to look over your shoulders with one hand support on counter as needed.  Start with head motions only to look in front of shoulder, then even with shoulder and progress to looking behind you. To look to side, move head /eyes, then shoulder on side looking pulls back, shift more weight to side looking and pull hip back. Place one hand in middle of sink and let go with other hand so your shoulder can pull back. Switch hands to look other way.   Then hover one hand and look over shoulder. If looking right, use left hand at sink. If looking left, use right hand at sink. 6.  Stepping with leg that is not amputated:  Move items under cabinet out of your way. Shift your hips/pelvis so weight on prosthesis. Tighten muscles in hip on prosthetic side.  SLOWLY step other leg so front of foot is in cabinet. Then step back to floor.

## 2024-03-20 NOTE — Therapy (Signed)
 OUTPATIENT PHYSICAL THERAPY PROSTHETIC TREATMENT   Patient Name: Mike Floyd MRN: [id number] DOB:06/18/1961, 63 y.o., male Today's Date: 03/20/2024  END OF SESSION:  PT End of Session - 03/20/24 1417     Visit Number 2    Number of Visits 25    Date for PT Re-Evaluation 05/31/24    Authorization Type [employer] MEDICARE    Authorization Time Period $45 copay    PT Start Time 1430    PT Stop Time 1515    PT Time Calculation (min) 45 min    Equipment Utilized During Treatment Gait belt    Activity Tolerance Patient tolerated treatment well;Patient limited by pain    Behavior During Therapy WFL for tasks assessed/performed           Past Medical History:  Diagnosis Date   AICD (automatic cardioverter/defibrillator) present 11/2007   Removed in 2018, a. 11/2007 S[facility] Current VR - single lead ICD  - Removed 2018 - it was burning me   Anxiety    CAD (coronary artery disease)    non-obstructive CAD by Cor CT in 2020 // Myoview  3/22: EF 57, small inf-sept defect c/w scar, no ischemia; low risk      Chest pain    a. 10/2007 Cath:  normal Cors.   CKD (chronic kidney disease), stage II    DDD (degenerative disc disease), lumbar    Diabetes mellitus DX: 2010   type 2   Erosive esophagitis    a. per EGD (08/2011), Dr. Golda - Erosive reflux esophagitis improved but not completely healed since previous EGD 3 years ago. Bx showing  ulcerated gatroesophageal junction mucosa. negative for H. pylori   GERD (gastroesophageal reflux disease)    Gout    Hearing deficit    a. wear bilateral hearing aides   History of hiatal hernia    History of kidney stones    passed stones, no surgery   Hypertension    Mildly dilatd aortic root (HCC)    CMR 4/22: EF 52, no LGE; d/w Dr. Ione root 38 mm (mildly dilated)   Myocardial infarction [facility]) 2011   Neuropathy    Feet and legs   Nonischemic dilated cardiomyopathy (HCC)    a. H/O EF as low as 35-40% by LV gram 10/2007;  b. Echo 02/2011 EF  50-55%, inf HK, Gr 1 DD // CMR 4/22: EF 52, no LGE; d/w Dr. Ione root 38 mm (mildly dilated)    Renal insufficiency    Sleep apnea    pt doesnt use, statesI cant afford one. PCP aware   Stroke (HCC)    mini-stroke in 2014   TIA (transient ischemic attack)    July, 2013   Tobacco abuse, in remission 06/27/2009   Discontinued in 2009     Wears dentures    top plate   WPW (Wolff-Parkinson-White syndrome)    a. s/p RFCA @ [facility] - 1999   Past Surgical History:  Procedure Laterality Date   AMPUTATION Left 05/19/2022   Procedure: TRANSMETATARSAL AMPUTATION LEFT FOOT;  Surgeon: Harden Jerona GAILS, MD;  Location: [facility] OR;  Service: Orthopedics;  Laterality: Left;   AMPUTATION Left 06/17/2023   Procedure: LEFT BELOW KNEE AMPUTATION;  Surgeon: Harden Jerona GAILS, MD;  Location: [facility] OR;  Service: Orthopedics;  Laterality: Left;   AMPUTATION Left 08/19/2023   Procedure: LEFT ABOVE KNEE AMPUTATION;  Surgeon: Harden Jerona GAILS, MD;  Location: [facility] OR;  Service: Orthopedics;  Laterality: Left;   APPENDECTOMY  BACK SURGERY     1995   CARDIAC CATHETERIZATION     2009   CARDIAC DEFIBRILLATOR PLACEMENT     ICD was removed in 2018   CHOLECYSTECTOMY     CHOLECYSTECTOMY, LAPAROSCOPIC     11/2007   COLONOSCOPY W/ POLYPECTOMY  2009   ELBOW SURGERY Left 06/2010   ESOPHAGEAL DILATION N/A 11/08/2014   Procedure: ESOPHAGEAL DILATION;  Surgeon: Claudis RAYMOND Rivet, MD;  Location: [facility] ORS;  Service: Endoscopy;  Laterality: N/A;  #56,    ESOPHAGOGASTRODUODENOSCOPY  03/31/2012   also 08/2011; [name]   ESOPHAGOGASTRODUODENOSCOPY (EGD) WITH PROPOFOL  N/A 11/08/2014   Procedure: ESOPHAGOGASTRODUODENOSCOPY (EGD) WITH PROPOFOL ;  Surgeon: Claudis RAYMOND Rivet, MD;  Location: [facility];  Service: Endoscopy;  Laterality: N/A;  Hiatus is 42 , GE Junction is 37   FOOT ARTHRODESIS Left 10/24/2020   Procedure: LEFT GASTROCNEMIUS RECESSION, DORSIFLEXION OSTEOTOMY 1ST MT;  Surgeon: Harden Jerona GAILS, MD;  Location: [facility] OR;  Service: Orthopedics;   Laterality: Left;   FOOT ARTHRODESIS Right 01/09/2021   Procedure: CLOSING WEDGE OSTEOTOMY RIGHT 1ST METATARSAL;  Surgeon: Harden Jerona GAILS, MD;  Location: [facility] OR;  Service: Orthopedics;  Laterality: Right;   GASTROCNEMIUS RECESSION Right 01/09/2021   Procedure: RIGHT GASTROCNEMIUS RECESSION;  Surgeon: Harden Jerona GAILS, MD;  Location: [facility] OR;  Service: Orthopedics;  Laterality: Right;   ICD LEAD REMOVAL N/A 10/07/2016   Procedure: ICD LEAD REMOVAL ;  Surgeon: Danelle LELON Birmingham, MD;  Location: [facility] OR;  Service: Cardiovascular;  Laterality: N/A;   MULTIPLE TOOTH EXTRACTIONS     RADIOFREQUENCY ABLATION  1999   WPW; [facility]   STUMP REVISION Left 07/22/2023   Procedure: REVISION LEFT BELOW KNEE AMPUTATION;  Surgeon: Harden Jerona GAILS, MD;  Location: [facility] OR;  Service: Orthopedics;  Laterality: Left;   TEE WITHOUT CARDIOVERSION N/A 10/07/2016   Procedure: TRANSESOPHAGEAL ECHOCARDIOGRAM (TEE);  Surgeon: Danelle LELON Birmingham, MD;  Location[facility] OR;  Service: Cardiovascular;  Laterality: N/A;   Patient Active Problem List   Diagnosis Date Noted   Dyslipidemia 11/03/2023   Essential hypertension 11/03/2023   Type 2 diabetes mellitus with peripheral neuropathy (HCC) 11/03/2023   BPH (benign prostatic hyperplasia) 11/03/2023   GERD without esophagitis 11/03/2023   S/P AKA (above knee amputation) unilateral, left (HCC) 08/19/2023   Dehiscence of amputation stump of left lower extremity (HCC) 07/22/2023   Ischemia of site of left below knee amputation (HCC) 07/22/2023   S/P BKA (below knee amputation) unilateral, left (HCC) 07/22/2023   Below-knee amputation of left lower extremity (HCC) 06/17/2023   Acute osteomyelitis of metatarsal bone of left foot (HCC)    Subacute osteomyelitis, left ankle and foot (HCC) 05/13/2022   Non-pressure chronic ulcer of other part of right foot limited to breakdown of skin (HCC)    Contracture of right Achilles tendon    Mildly dilatd aortic root (HCC)    Contracture of  left Achilles tendon    Metatarsal deformity, left    CAD (coronary artery disease)    Hallux hammertoe, left 12/11/2019   Diarrhea 06/27/2019   Diastasis recti 08/22/2018   Umbilical hernia without obstruction and without gangrene 08/22/2018   Vitamin D  deficiency 06/23/2017   Essential hypertension, benign 06/15/2017   Mixed hyperlipidemia 06/15/2017   Class 1 obesity due to excess calories with serious comorbidity and body mass index (BMI) of 31.0 to 31.9 in adult 06/15/2017   Malfunction of implantable cardioverter-defibrillator (ICD) electrode 10/07/2016   Chronic kidney disease (CKD), stage III (moderate) (  HCC) 04/04/2015   Bladder neck obstruction 03/21/2015   Difficult or painful urination 03/21/2015   Flank pain 03/21/2015   Delayed onset of urination 03/21/2015   Calculus of kidney 03/21/2015   Erosive esophagitis 12/27/2013   Hypotension due to drugs 07/02/2013   Chronic systolic heart failure (HCC) 04/24/2013   CAP (community acquired pneumonia) 03/16/2013   HTN (hypertension) 03/15/2013   Hydrocele 03/15/2013   Spermatocele 03/15/2013   DOE (dyspnea on exertion) 03/15/2013   OSA (obstructive sleep apnea) 01/09/2013   Chronic kidney disease, stage 3 (HCC) 01/03/2013   Chest pain 12/26/2012   History of diagnostic tests 09/06/2012   TIA (transient ischemic attack)    Cardiomyopathy, nonischemic (HCC) 02/24/2010   AICD (automatic cardioverter/defibrillator) 02/24/2010   DM type 2 causing vascular disease (HCC) 06/27/2009   Gout 06/27/2009   Tobacco abuse, in remission 06/27/2009   COLONIC POLYPS 10/31/2008   GERD (gastroesophageal reflux disease) 10/31/2008    PCP: Bertell Satterfield, MD  REFERRING PROVIDER: Harden Jerona GAILS, MD  REFERRING DIAG: [phone] (ICD-10-CM) - Hx of AKA (above knee amputation), left (HCC)   THERAPY DIAG:  Other abnormalities of gait and mobility  Muscle weakness (generalized)  Pain in left leg  Stiffness of left hip, not elsewhere  classified  Abnormal posture  Unsteadiness on feet  Non-pressure chronic ulcer of skin of other sites with other specified severity [facility])  Rationale for Evaluation and Treatment: Rehabilitation  ONSET DATE: Prosthesis delivery 02/20/2024  SUBJECTIVE:   SUBJECTIVE STATEMENT: He is wearing prosthesis every day for most of awake hours.  No falls since PT.    Eval: Patient is a 63 year old male who presents to physical therapy with a left AKA (08/19/2023) with a new prosthesis delivery on 02/20/2024. Patient had previous home health PT prior to Lt AKA. He is able to mobilize with modified independence w/ walker. Wears prosthesis about ten hours a day and by the end of the night he takes it off due to fatigue and pain.   PERTINENT HISTORY: DM2, GERD, hypertension, dyslipidemia, CAD, L BKA (06/17/23), neuropathy  PAIN:  Are you having pain? Yes: NPRS scale:   3-6/10 in the past week, none at rest during session, previous back pain is the same post amputation Pain location: Femur in prosthesis and pain in the Rt leg as well Pain description: feels like its been hammered Aggravating factors: keeping prosthesis on for long periods of times, sitting Relieving factors: taking off prosthesis and medications  PRECAUTIONS: Fall  RED FLAGS: None   WEIGHT BEARING RESTRICTIONS: No  FALLS:  Has patient fallen in last 6 months? Yes. Number of falls 2, forgetting he doesn't have a leg and put weight onto the left side. He did not hit the floor, only fell into another person. Occurred when transferring from sitting to standing  LIVING ENVIRONMENT: Lives with: lives with their spouse Lives in: Other Trailer on grass Stairs: Yes: External: 4 steps; can reach both Has following equipment at home: Single point cane, Environmental consultant - 2 wheeled, Wheelchair (manual), and shower chair  OCCUPATION: retired   PLOF: Independent with household mobility with device  PATIENT GOALS: ambulating and ADLs with cane    NEXT MD VISIT: Office MD visit: 03/21/2024   OBJECTIVE:  Patient-Specific Activity Scoring Scheme  0 represents "unable to perform." 10 represents "able to perform at prior level. 0 1 2 3 4 5 6 7 8 9  10 (Date and Score)   Activity Eval     1. Walking w/ cane  2    2. Standing for ADLs  5    3. Stairs 5   4.    5.    Score 4    Total score = sum of the activity scores/number of activities Minimum detectable change (90%CI) for average score = 2 points Minimum detectable change (90%CI) for single activity score = 3 points  DIAGNOSTIC FINDINGS: none post-amputation  COGNITION: Overall cognitive status: Within functional limits for tasks assessed   SENSATION: WFL  MUSCLE LENGTH:   POSTURE: rounded shoulders, forward head, flexed trunk , and weight shift right  LOWER EXTREMITY ROM:  ROM P:passive  A:active Left eval  Hip flexion   Hip extension Thomas position: P: - 40*  Hip abduction   Hip adduction   Hip internal rotation   Hip external rotation   Knee flexion   Knee extension   Ankle dorsiflexion   Ankle plantarflexion   Ankle inversion   Ankle eversion    (Blank rows = not tested)  LOWER EXTREMITY MMT:  MMT Right eval Left eval  Hip flexion 3/5 3/5  Hip extension 3/5 3-/5  Hip abduction  3-/5  Hip adduction    Hip internal rotation    Hip external rotation    Knee flexion    Knee extension 3/5   Ankle dorsiflexion    Ankle plantarflexion    Ankle inversion    Ankle eversion    At Evaluation all strength testing is grossly seated and functionally standing / gait. (Blank rows = not tested)  BED MOBILITY:  Sit to supine Complete Independence Supine to sit Complete Independence Rolling to Right Complete Independence  TRANSFERS: Sit to stand: supervision requires a stable object to arise, stabilization w/ walker using UE support, with delayed weight shift onto the prosthesis for stabilization, and uses UE to bring Lt prosthesis back  below his body  Stand to sit: supervision requires a stable object to arise, stabilization w/ walker using UE support, with delayed weight shift onto the prosthesis for stabilization  FUNCTIONAL TESTs:  BERG score 11/56   GAIT: Gait pattern: step to pattern, decreased step length- Right, decreased stance time- Left, decreased hip/knee flexion- Left, circumduction- Left, Left hip hike, antalgic, trendelenburg, lateral lean- Right, trunk flexed, abducted- Left, and poor foot clearance- Left Distance walked: 62 ft Assistive device utilized: Walker - 2 wheeled and AKA prosthesis Level of assistance: SBA / steady with RW support but needs cues  Gait velocity: 0.72 ft/sec Comments: excessive weightbearing on BUEs using RW  RAMP : not tested  CURB: not tested  CURRENT PROSTHETIC WEAR ASSESSMENT:   Patient is dependent with: skin check, residual limb care, care of non-amputated limb, prosthetic cleaning, ply sock cleaning, correct ply sock adjustment, proper wear schedule/adjustment, and proper weight-bearing schedule/adjustment Donning prosthesis: SBA / verbal cues Doffing prosthesis: Complete Independence Prosthetic wear tolerance: 6-8 hours, 1x/day, 7 days/week Prosthetic weight bearing tolerance: tolerated standing for 10 minutes with partial weight on prosthesis during BERG balance test with no increased pain. Although he reports standing pain with activities at home. Edema: minimal Residual limb condition: 3x17mm superficial wound distally located, scar with mild adherence, no adductor roll, normal skin color, decreased hair growth present Prosthetic description: silicone liner with suction ring suspension, ischial containment socket with flexible inner socket, rotator unit, SAFETY (single axis friction engaging with an extension assist), flexible keel foot   TODAY'S TREATMENT:  DATE: 03/20/2024 Therapeutic exercise: PT instructed patient in hip flexor stretch both in hook lying and Thomas position.  Patient performed left hip flexor stretch 2 reps in hook lying and 2 reps in [place] position 30 sec hold.  PT provided HO, demo and verbal cues.  Patient verbalized understanding for HEP after performing in clinic.  Neuromuscular re-education: Posture exercises for HEP including standing at doorframe reaching single upper extremity and bilateral upper extremity overhead 2 reps each to deep breaths for hold.  PT provided HO, demo and verbal cues.  Patient verbalized understanding for HEP after performing in clinic. Upright posture with posterior pelvis to counter with 2-minute hold. PT provided HO, demo and verbal cues.  Patient verbalized understanding for HEP after performing in clinic. Proprioceptive feedback for standing with equal weight on bilateral lower extremities visual cue at sink for feet placed equally off of midline and pelvic weight shift to midline.  PT cued that this stance is ideal for stationary activities.  Progressed to weight shift right and left with proprioception feedback of the socket interface. PT provided HO, demo and verbal cues.  Patient verbalized understanding for HEP after performing in clinic.   TREATMENT:                                                                                                                             DATE: 03/05/2024 Prosthetic Care with AKA prosthesis: See patient education below.  PATIENT EDUCATION: PATIENT EDUCATED ON FOLLOWING PROSTHETIC CARE: Education details: Sitting positioning to limit skin traction from weight of prosthesis, how hip flexion contracture effects residual limb pain & posture,  Prosthetic cleaning and Propper donning  Person educated: Patient, Spouse, and close family friend Education method: Explanation, Demonstration, Tactile cues, and Verbal cues Education comprehension:  verbalized understanding and needs further education  HOME EXERCISE PROGRAM: Access Code: [email or web address] URL: [email or web address] Date: 03/20/2024 Prepared by: Grayce Spatz  Exercises - Modified Thomas Stretch  - 2 x daily - 7 x weekly - 1 sets - 2-3 reps - 30 seconds hold - Hip Flexor Stretch at Edge of Bed  - 2 x daily - 7 x weekly - 1 sets - 2-3 reps - 30 seconds hold - Standing posture with back to counter  - 2 x daily - 7 x weekly - 1 sets - 1 reps - 5-10 minutes hold - Upright Stance at Door Frame Single Arm  - 3-6 x daily - 7 x weekly - 1 sets - 2 reps - 2 deep breathes hold - Upright Stance at Door Frame with Both Arms  - 3-6 x daily - 7 x weekly - 1 sets - 2 reps - 2 deep breathes hold  Do each exercise 1-2  times per day Do each exercise 5-10 repetitions Hold each exercise for 2 seconds to feel your location  AT SINK FIND YOUR MIDLINE POSITION AND PLACE FEET EQUAL DISTANCE FROM THE MIDLINE.  Try to find this position when standing  still for activities.   USE TAPE ON FLOOR TO MARK THE MIDLINE POSITION which is even with middle of sink.  You also should try to feel with your limb pressure in socket.  You are trying to feel with limb what you used to feel with the bottom of your foot.  Side to Side Shift: Moving your hips only (not shoulders): move weight onto your left leg, HOLD/FEEL pressure in socket.  Move back to equal weight on each leg, HOLD/FEEL pressure in socket. Move weight onto your right leg, HOLD/FEEL pressure in socket. Move back to equal weight on each leg, HOLD/FEEL pressure in socket. Repeat.  Start with both hands on sink, progress to hand on prosthetic side only, then no hands.  Front to Back Shift: Moving your hips only (not shoulders): move your weight forward onto your toes, HOLD/FEEL pressure in socket. Move your weight back to equal Flat Foot on both legs, HOLD/FEEL  pressure in socket. Move your weight back onto your heels, HOLD/FEEL  pressure in  socket. Move your weight back to equal on both legs, HOLD/FEEL  pressure in socket. Repeat.  Start with both hands on sink, progress to hand on prosthetic side only, then no hands.  Moving Cones / Cups: With equal weight on each leg: Hold on with one hand the first time, then progress to no hand supports. Move cups from one side of sink to the other. Place cups ~2" out of your reach, progress to 10" beyond reach.  Place one hand in middle of sink and reach with other hand. Do both arms.  Then hover one hand and move cups with other hand.  Overhead/Upward Reaching: alternated reaching up to top cabinets or ceiling if no cabinets present. Keep equal weight on each leg. Start with one hand support on counter while other hand reaches and progress to no hand support with reaching.  ace one hand in middle of sink and reach with other hand. Do both arms.  Then hover one hand and move cups with other hand.  5.   Looking Over Shoulders: With equal weight on each leg: alternate turning to look over your shoulders with one hand support on counter as needed.  Start with head motions only to look in front of shoulder, then even with shoulder and progress to looking behind you. To look to side, move head /eyes, then shoulder on side looking pulls back, shift more weight to side looking and pull hip back. Place one hand in middle of sink and let go with other hand so your shoulder can pull back. Switch hands to look other way.   Then hover one hand and look over shoulder. If looking right, use left hand at sink. If looking left, use right hand at sink. 6.  Stepping with leg that is not amputated:  Move items under cabinet out of your way. Shift your hips/pelvis so weight on prosthesis. Tighten muscles in hip on prosthetic side.  SLOWLY step other leg so front of foot is in cabinet. Then step back to floor.    ASSESSMENT:  CLINICAL IMPRESSION: PT educated in HEP to address hip flexion contracture and upright posture.   Patient and family appear to have a basic understanding of initial program.  OBJECTIVE IMPAIRMENTS: Abnormal gait, decreased activity tolerance, decreased balance, decreased endurance, decreased knowledge of condition, decreased knowledge of use of DME, decreased mobility, difficulty walking, decreased ROM, decreased strength, impaired flexibility, postural dysfunction, prosthetic dependency , and pain.   ACTIVITY  LIMITATIONS: carrying, lifting, bending, sitting, standing, squatting, stairs, transfers, bathing, toileting, dressing, reach over head, and locomotion level  PARTICIPATION LIMITATIONS: meal prep, cleaning, laundry, driving, shopping, community activity, and yard work  PERSONAL FACTORS: Age, Fitness, Past/current experiences, Time since onset of injury/illness/exacerbation, and 3+ comorbidities: see PMH are also affecting patient's functional outcome.   REHAB POTENTIAL: Good  CLINICAL DECISION MAKING: Evolving/moderate complexity  EVALUATION COMPLEXITY: Moderate   GOALS: Goals reviewed with patient? Yes  SHORT TERM GOALS: Target date: 04/05/2024  Patient donnes prosthesis modified independent & verbalizes proper cleaning. Baseline: SEE OBJECTIVE DATA Goal status: Ongoing   03/20/2024  2.  Patient tolerates prosthesis >12 hrs total /day without skin issues or limb pain >7/10 after standing. Baseline: SEE OBJECTIVE DATA Goal status: Ongoing   03/20/2024  3.  Patient able to reach 7 and look over both shoulders with RW support with Modified independent. Baseline: SEE OBJECTIVE DATA Goal status: Ongoing   03/20/2024  4. Patient ambulates 125' with RW & prosthesis with supervision. Baseline: SEE OBJECTIVE DATA Goal status: Ongoing   03/20/2024  5. Patient negotiates ramps & curbs with RW & prosthesis with supervision. Baseline: SEE OBJECTIVE DATA Goal status: Ongoing   03/20/2024  LONG TERM GOALS: Target date: 05/31/2024  Patient demonstrates & verbalized understanding of  prosthetic care to enable safe utilization of prosthesis. Baseline: SEE OBJECTIVE DATA Goal status: Ongoing   03/20/2024  Patient tolerates prosthesis wear >90% of awake hours without skin issues or limb pain >4/10. Baseline: SEE OBJECTIVE DATA Goal status: Ongoing   03/20/2024  BERG >/= 25/56 to indicate lower fall risk Baseline: SEE OBJECTIVE DATA Goal status: Ongoing   03/20/2024  Patient ambulates >300' with prosthesis and LRAD independently Baseline: SEE OBJECTIVE DATA Goal status: Ongoing   03/20/2024  Patient negotiates ramps, curbs & stairs with single rail with prosthesis and LRAD independently. Baseline: SEE OBJECTIVE DATA Goal status: Ongoing   03/20/2024  6.  Patient reports Patient-Specific Activity Score improved the average by 3 to indicate improvement in functional activities.   Baseline: SEE OBJECTIVE DATA Goal status: Ongoing   03/20/2024  PLAN:  PT FREQUENCY: 2x/week  PT DURATION: 12 weeks  PLANNED INTERVENTIONS: [id number]- PT Re-evaluation, 97750- Physical Performance Testing, 97110-Therapeutic exercises, 97530- Therapeutic activity, 97112- Neuromuscular re-education, 97535- Self Care, [id number]- Manual therapy, [id number]- Gait training, [phone]- Prosthetic Initial , [phone]- Orthotic/Prosthetic subsequent, Patient/Family education, Balance training, Stair training, Joint mobilization, and DME instructions  PLAN FOR NEXT SESSION:    prosthetic gait training w/ RW including ramp and curb negotiation.  Grayce Spatz, PT, DPT 03/20/2024, 4:54 PM     Date of referral: 02/13/2024 Referring provider: Harden Jerona GAILS, MD Referring diagnosis? S10.387 (ICD-10-CM) - Hx of AKA (above knee amputation), left (HCC)  Treatment diagnosis? (if different than referring diagnosis) R26.89, M62.81, M79.605, M25.652, R29.3, R26.81, L98.498  What was this (referring dx) caused by? Other: Lt AKA  Nature of Condition: Initial Onset (within last 3 months)   Laterality: Lt  Current Functional  Measure Score: Patient Specific Functional Scale Score:4  Objective measurements identify impairments when they are compared to normal values, the uninvolved extremity, and prior level of function.  [x]  Yes  []  No  Objective assessment of functional ability: Moderate functional limitations   Briefly describe symptoms: Patient is dependent in prosthetic care and use with high risk of falls. Gait deviation, muscle weakness, pain in Lt residual limb, hip flexion contracture.  How did symptoms start: amputation due to vascular issues  Average pain intensity:  Last 24 hours: 8-9/10  Past week: 8-9/10  How often does the pt experience symptoms? Constantly  How much have the symptoms interfered with usual daily activities? Quite a bit  How has condition changed since care began at this facility? NA - initial visit  In general, how is the patients overall health? Good   BACK PAIN (STarT Back Screening Tool) No

## 2024-03-21 ENCOUNTER — Ambulatory Visit: Admitting: Student

## 2024-03-21 NOTE — Therapy (Signed)
 OUTPATIENT PHYSICAL THERAPY PROSTHETIC TREATMENT   Patient Name: Mike Floyd MRN: [id number] DOB:[birth year], 63 y.o., male Today's Date: 03/22/2024  END OF SESSION:  PT End of Session - 03/22/24 1430     Visit Number 3    Number of Visits 25    Date for PT Re-Evaluation 05/31/24    Authorization Type [employer] MEDICARE    Authorization Time Period $45 copay    PT Start Time 1431    PT Stop Time 1512    PT Time Calculation (min) 41 min    Equipment Utilized During Treatment Gait belt    Activity Tolerance Patient tolerated treatment well;Patient limited by pain    Behavior During Therapy WFL for tasks assessed/performed            Past Medical History:  Diagnosis Date   AICD (automatic cardioverter/defibrillator) present 11/2007   Removed in 2018, a. 11/2007 [facility] Current VR - single lead ICD  - Removed 2018 - it was burning me   Anxiety    CAD (coronary artery disease)    non-obstructive CAD by Cor CT in 2020 // Myoview  3/22: EF 57, small inf-sept defect c/w scar, no ischemia; low risk      Chest pain    a. 10/2007 Cath:  normal Cors.   CKD (chronic kidney disease), stage II    DDD (degenerative disc disease), lumbar    Diabetes mellitus DX: 2010   type 2   Erosive esophagitis    a. per EGD (08/2011), Dr. Golda - Erosive reflux esophagitis improved but not completely healed since previous EGD 3 years ago. Bx showing  ulcerated gatroesophageal junction mucosa. negative for H. pylori   GERD (gastroesophageal reflux disease)    Gout    Hearing deficit    a. wear bilateral hearing aides   History of hiatal hernia    History of kidney stones    passed stones, no surgery   Hypertension    Mildly dilatd aortic root (HCC)    CMR 4/22: EF 52, no LGE; d/w Dr. Ione root 38 mm (mildly dilated)   Myocardial infarction [facility]) 2011   Neuropathy    Feet and legs   Nonischemic dilated cardiomyopathy (HCC)    a. H/O EF as low as 35-40% by LV gram 10/2007;  b. Echo 02/2011 EF  50-55%, inf HK, Gr 1 DD // CMR 4/22: EF 52, no LGE; d/w Dr. Ione root 38 mm (mildly dilated)    Renal insufficiency    Sleep apnea    pt doesnt use, statesI cant afford one. PCP aware   Stroke (HCC)    mini-stroke in 2014   TIA (transient ischemic attack)    July, 2013   Tobacco abuse, in remission 06/27/2009   Discontinued in 2009     Wears dentures    top plate   WPW (Wolff-Parkinson-White syndrome)    a. s/p RFCA @ [facility] - 1999   Past Surgical History:  Procedure Laterality Date   AMPUTATION Left 05/19/2022   Procedure: TRANSMETATARSAL AMPUTATION LEFT FOOT;  Surgeon: Harden Jerona GAILS, MD;  Location: [facility] OR;  Service: Orthopedics;  Laterality: Left;   AMPUTATION Left 06/17/2023   Procedure: LEFT BELOW KNEE AMPUTATION;  Surgeon: Harden Jerona GAILS, MD;  Location: [facility] OR;  Service: Orthopedics;  Laterality: Left;   AMPUTATION Left 08/19/2023   Procedure: LEFT ABOVE KNEE AMPUTATION;  Surgeon: Harden Jerona GAILS, MD;  Location: [facility] OR;  Service: Orthopedics;  Laterality: Left;   APPENDECTOMY  BACK SURGERY     1995   CARDIAC CATHETERIZATION     2009   CARDIAC DEFIBRILLATOR PLACEMENT     ICD was removed in 2018   CHOLECYSTECTOMY     CHOLECYSTECTOMY, LAPAROSCOPIC     11/2007   COLONOSCOPY W/ POLYPECTOMY  2009   ELBOW SURGERY Left 06/2010   ESOPHAGEAL DILATION N/A 11/08/2014   Procedure: ESOPHAGEAL DILATION;  Surgeon: Claudis RAYMOND Rivet, MD;  Location: [facility] ORS;  Service: Endoscopy;  Laterality: N/A;  #56,    ESOPHAGOGASTRODUODENOSCOPY  03/31/2012   also 08/2011; [name]   ESOPHAGOGASTRODUODENOSCOPY (EGD) WITH PROPOFOL  N/A 11/08/2014   Procedure: ESOPHAGOGASTRODUODENOSCOPY (EGD) WITH PROPOFOL ;  Surgeon: Claudis RAYMOND Rivet, MD;  Location: [facility] ORS;  Service: Endoscopy;  Laterality: N/A;  [place] is 57 , [place] is 35   FOOT ARTHRODESIS Left 10/24/2020   Procedure: LEFT GASTROCNEMIUS RECESSION, DORSIFLEXION OSTEOTOMY 1ST MT;  Surgeon: Harden Jerona GAILS, MD;  Location: [facility] OR;  Service: Orthopedics;   Laterality: Left;   FOOT ARTHRODESIS Right 01/09/2021   Procedure: CLOSING WEDGE OSTEOTOMY RIGHT 1ST METATARSAL;  Surgeon: Harden Jerona GAILS, MD;  Location: [facility] OR;  Service: Orthopedics;  Laterality: Right;   GASTROCNEMIUS RECESSION Right 01/09/2021   Procedure: RIGHT GASTROCNEMIUS RECESSION;  Surgeon: Harden Jerona GAILS, MD;  Location: [facility] OR;  Service: Orthopedics;  Laterality: Right;   ICD LEAD REMOVAL N/A 10/07/2016   Procedure: ICD LEAD REMOVAL ;  Surgeon: Danelle LELON Birmingham, MD;  Location: [facility] OR;  Service: Cardiovascular;  Laterality: N/A;   MULTIPLE TOOTH EXTRACTIONS     RADIOFREQUENCY ABLATION  1999   WPW; performed at [facility]   STUMP REVISION Left 07/22/2023   Procedure: REVISION LEFT BELOW KNEE AMPUTATION;  Surgeon: Harden Jerona GAILS, MD;  Location: [facility] OR;  Service: Orthopedics;  Laterality: Left;   TEE WITHOUT CARDIOVERSION N/A 10/07/2016   Procedure: TRANSESOPHAGEAL ECHOCARDIOGRAM (TEE);  Surgeon: Danelle LELON Birmingham, MD;  Location: [facility] OR;  Service: Cardiovascular;  Laterality: N/A;   Patient Active Problem List   Diagnosis Date Noted   Dyslipidemia 11/03/2023   Essential hypertension 11/03/2023   Type 2 diabetes mellitus with peripheral neuropathy (HCC) 11/03/2023   BPH (benign prostatic hyperplasia) 11/03/2023   GERD without esophagitis 11/03/2023   S/P AKA (above knee amputation) unilateral, left (HCC) 08/19/2023   Dehiscence of amputation stump of left lower extremity (HCC) 07/22/2023   Ischemia of site of left below knee amputation (HCC) 07/22/2023   S/P BKA (below knee amputation) unilateral, left (HCC) 07/22/2023   Below-knee amputation of left lower extremity (HCC) 06/17/2023   Acute osteomyelitis of metatarsal bone of left foot (HCC)    Subacute osteomyelitis, left ankle and foot (HCC) 05/13/2022   Non-pressure chronic ulcer of other part of right foot limited to breakdown of skin (HCC)    Contracture of right Achilles tendon    Mildly dilatd aortic root (HCC)    Contracture of  left Achilles tendon    Metatarsal deformity, left    CAD (coronary artery disease)    Hallux hammertoe, left 12/11/2019   Diarrhea 06/27/2019   Diastasis recti 08/22/2018   Umbilical hernia without obstruction and without gangrene 08/22/2018   Vitamin D  deficiency 06/23/2017   Essential hypertension, benign 06/15/2017   Mixed hyperlipidemia 06/15/2017   Class 1 obesity due to excess calories with serious comorbidity and body mass index (BMI) of 31.0 to 31.9 in adult 06/15/2017   Malfunction of implantable cardioverter-defibrillator (ICD) electrode 10/07/2016   Chronic kidney disease (CKD), stage III (moderate) (  HCC) 04/04/2015   Bladder neck obstruction 03/21/2015   Difficult or painful urination 03/21/2015   Flank pain 03/21/2015   Delayed onset of urination 03/21/2015   Calculus of kidney 03/21/2015   Erosive esophagitis 12/27/2013   Hypotension due to drugs 07/02/2013   Chronic systolic heart failure (HCC) 04/24/2013   CAP (community acquired pneumonia) 03/16/2013   HTN (hypertension) 03/15/2013   Hydrocele 03/15/2013   Spermatocele 03/15/2013   DOE (dyspnea on exertion) 03/15/2013   OSA (obstructive sleep apnea) 01/09/2013   Chronic kidney disease, stage 3 (HCC) 01/03/2013   Chest pain 12/26/2012   History of diagnostic tests 09/06/2012   TIA (transient ischemic attack)    Cardiomyopathy, nonischemic (HCC) 02/24/2010   AICD (automatic cardioverter/defibrillator) 02/24/2010   DM type 2 causing vascular disease (HCC) 06/27/2009   Gout 06/27/2009   Tobacco abuse, in remission 06/27/2009   COLONIC POLYPS 10/31/2008   GERD (gastroesophageal reflux disease) 10/31/2008    PCP: Bertell Satterfield, MD  REFERRING PROVIDER: Harden Jerona GAILS, MD  REFERRING DIAG: [phone] (ICD-10-CM) - Hx of AKA (above knee amputation), left (HCC)   THERAPY DIAG:  Other abnormalities of gait and mobility  Muscle weakness (generalized)  Pain in left leg  Stiffness of left hip, not elsewhere  classified  Abnormal posture  Unsteadiness on feet  Non-pressure chronic ulcer of skin of other sites with other specified severity [facility])  Rationale for Evaluation and Treatment: Rehabilitation  ONSET DATE: Prosthesis delivery 02/20/2024  SUBJECTIVE:   SUBJECTIVE STATEMENT: Patient notes doing well and completing all new exercises. No complaints of pain this date.     PERTINENT HISTORY: DM2, GERD, hypertension, dyslipidemia, CAD, L BKA (06/17/23), neuropathy  Eval: Patient is a 62 year old male who presents to physical therapy with a left AKA (08/19/2023) with a new prosthesis delivery on 02/20/2024. Patient had previous home health PT prior to Lt AKA. He is able to mobilize with modified independence w/ walker. Wears prosthesis about ten hours a day and by the end of the night he takes it off due to fatigue and pain.   PAIN:  Are you having pain? Yes: NPRS scale:   3-6/10 in the past week, none at rest during session, previous back pain is the same post amputation Pain location: Femur in prosthesis and pain in the Rt leg as well Pain description: feels like its been hammered Aggravating factors: keeping prosthesis on for long periods of times, sitting Relieving factors: taking off prosthesis and medications  PRECAUTIONS: Fall  RED FLAGS: None   WEIGHT BEARING RESTRICTIONS: No  FALLS:  Has patient fallen in last 6 months? Yes. Number of falls 2, forgetting he doesn't have a leg and put weight onto the left side. He did not hit the floor, only fell into another person. Occurred when transferring from sitting to standing  LIVING ENVIRONMENT: Lives with: lives with their spouse Lives in: Other Trailer on grass Stairs: Yes: External: 4 steps; can reach both Has following equipment at home: Single point cane, Environmental consultant - 2 wheeled, Wheelchair (manual), and shower chair  OCCUPATION: retired   PLOF: Independent with household mobility with device  PATIENT GOALS: ambulating and  ADLs with cane   NEXT MD VISIT: Office MD visit: 03/21/2024   OBJECTIVE:  Patient-Specific Activity Scoring Scheme  0 represents "unable to perform." 10 represents "able to perform at prior level. 0 1 2 3 4 5 6 7 8 9  10 (Date and Score)   Activity Eval     1. Walking w/ cane  2    2. Standing for ADLs  5    3. Stairs 5   4.    5.    Score 4    Total score = sum of the activity scores/number of activities Minimum detectable change (90%CI) for average score = 2 points Minimum detectable change (90%CI) for single activity score = 3 points  DIAGNOSTIC FINDINGS: none post-amputation  COGNITION: Overall cognitive status: Within functional limits for tasks assessed   SENSATION: WFL  MUSCLE LENGTH:   POSTURE: rounded shoulders, forward head, flexed trunk , and weight shift right  LOWER EXTREMITY ROM:  ROM P:passive  A:active Left eval  Hip flexion   Hip extension Thomas position: P: - 40*  Hip abduction   Hip adduction   Hip internal rotation   Hip external rotation   Knee flexion   Knee extension   Ankle dorsiflexion   Ankle plantarflexion   Ankle inversion   Ankle eversion    (Blank rows = not tested)  LOWER EXTREMITY MMT:  MMT Right eval Left eval  Hip flexion 3/5 3/5  Hip extension 3/5 3-/5  Hip abduction  3-/5  Hip adduction    Hip internal rotation    Hip external rotation    Knee flexion    Knee extension 3/5   Ankle dorsiflexion    Ankle plantarflexion    Ankle inversion    Ankle eversion    At Evaluation all strength testing is grossly seated and functionally standing / gait. (Blank rows = not tested)  BED MOBILITY:  Sit to supine Complete Independence Supine to sit Complete Independence Rolling to Right Complete Independence  TRANSFERS: Sit to stand: supervision requires a stable object to arise, stabilization w/ walker using UE support, with delayed weight shift onto the prosthesis for stabilization, and uses UE to bring Lt  prosthesis back below his body  Stand to sit: supervision requires a stable object to arise, stabilization w/ walker using UE support, with delayed weight shift onto the prosthesis for stabilization  FUNCTIONAL TESTs:  BERG score 11/56   GAIT: Gait pattern: step to pattern, decreased step length- Right, decreased stance time- Left, decreased hip/knee flexion- Left, circumduction- Left, Left hip hike, antalgic, trendelenburg, lateral lean- Right, trunk flexed, abducted- Left, and poor foot clearance- Left Distance walked: 62 ft Assistive device utilized: Walker - 2 wheeled and AKA prosthesis Level of assistance: SBA / steady with RW support but needs cues  Gait velocity: 0.72 ft/sec Comments: excessive weightbearing on BUEs using RW  RAMP : not tested  CURB: not tested  CURRENT PROSTHETIC WEAR ASSESSMENT:   Patient is dependent with: skin check, residual limb care, care of non-amputated limb, prosthetic cleaning, ply sock cleaning, correct ply sock adjustment, proper wear schedule/adjustment, and proper weight-bearing schedule/adjustment Donning prosthesis: SBA / verbal cues Doffing prosthesis: Complete Independence Prosthetic wear tolerance: 6-8 hours, 1x/day, 7 days/week Prosthetic weight bearing tolerance: tolerated standing for 10 minutes with partial weight on prosthesis during BERG balance test with no increased pain. Although he reports standing pain with activities at home. Edema: minimal Residual limb condition: 3x52mm superficial wound distally located, scar with mild adherence, no adductor roll, normal skin color, decreased hair growth present Prosthetic description: silicone liner with suction ring suspension, ischial containment socket with flexible inner socket, rotator unit, SAFETY (single axis friction engaging with an extension assist), flexible keel foot  TODAY'S TREATMENT:  DATE: 03/20/2024 Prosthetic Care with AKA prosthesis Patient ambulated with RW and CGA-SBA for 93' and 2x35'. No instances of LOB, though did have instances of requiring readjustment of prosthesis prior to take step with Rt LE.   Patient ambulated with SPC with quad tip and CGA to max A using 3-point method for 2x29'. One instance of requiring max A secondary to Lt knee buckle and anterior LOB; patient able to readjust with max A and return to ambulating with CGA. Patient performing with decreased swing through for Rt LE secondary to lack of Lt weight shift/decreased stance time on Lt LE. Multiple verbal cues given for upright posture and forward gaze with minimal carryover as patient was more comfortable with downward gaze as he was unsteady.  Patient performed ramp x2 with RW and CGA-min A. Verbal cues given for hand placement, step length, and body position with descent of ramp; excellent carryover from patient. Patient having more difficulty with ascent.  Patient performed one step up/down of the curb in the clinic with RW and CGA. Patient requiring cues for RW placement, though did not need cues for foot placement for ascent or descent. No requirements for increase physical assist.  Patient performing weight shifts at counter with intermittent UE use for balance, though did not need increased physical assist from PT past CGA. Patient performing 50% body weight weight shift to Lt LE for 5x with 5s hold on Lt LE. Patient then performed 2x5 with 5s hold with 75% body weight weight shift onto Lt LE. Patient performed most weight shifts with downward gaze even when given verbal cues for upright posture and forward gaze. Seated rest break between sets of 75%.  Patient performing multiple STS with RW and SPC with quad tip and CGA throughout session.     TODAY'S TREATMENT:                                                                                                                              DATE: 03/20/2024 Therapeutic exercise: PT instructed patient in hip flexor stretch both in hook lying and Thomas position.  Patient performed left hip flexor stretch 2 reps in hook lying and 2 reps in Kingman position 30 sec hold.  PT provided HO, demo and verbal cues.  Patient verbalized understanding for HEP after performing in clinic.  Neuromuscular re-education: Posture exercises for HEP including standing at doorframe reaching single upper extremity and bilateral upper extremity overhead 2 reps each to deep breaths for hold.  PT provided HO, demo and verbal cues.  Patient verbalized understanding for HEP after performing in clinic. Upright posture with posterior pelvis to counter with 2-minute hold. PT provided HO, demo and verbal cues.  Patient verbalized understanding for HEP after performing in clinic. Proprioceptive feedback for standing with equal weight on bilateral lower extremities visual cue at sink for feet placed equally off of midline and pelvic weight shift to midline.  PT cued that this stance is  ideal for stationary activities.  Progressed to weight shift right and left with proprioception feedback of the socket interface. PT provided HO, demo and verbal cues.  Patient verbalized understanding for HEP after performing in clinic.   TREATMENT:                                                                                                                             DATE: 03/05/2024 Prosthetic Care with AKA prosthesis: See patient education below.  PATIENT EDUCATION: PATIENT EDUCATED ON FOLLOWING PROSTHETIC CARE: Education details: Sitting positioning to limit skin traction from weight of prosthesis, how hip flexion contracture effects residual limb pain & posture,  Prosthetic cleaning and Propper donning  Person educated: Patient, Spouse, and close family friend Education method: Explanation, Demonstration, Tactile cues, and Verbal cues Education comprehension: verbalized  understanding and needs further education  HOME EXERCISE PROGRAM: Access Code: [email or web address] URL: [email or web address] Date: 03/20/2024 Prepared by: Grayce Spatz  Exercises - Modified Thomas Stretch  - 2 x daily - 7 x weekly - 1 sets - 2-3 reps - 30 seconds hold - Hip Flexor Stretch at Edge of Bed  - 2 x daily - 7 x weekly - 1 sets - 2-3 reps - 30 seconds hold - Standing posture with back to counter  - 2 x daily - 7 x weekly - 1 sets - 1 reps - 5-10 minutes hold - Upright Stance at Door Frame Single Arm  - 3-6 x daily - 7 x weekly - 1 sets - 2 reps - 2 deep breathes hold - Upright Stance at Door Frame with Both Arms  - 3-6 x daily - 7 x weekly - 1 sets - 2 reps - 2 deep breathes hold  Do each exercise 1-2  times per day Do each exercise 5-10 repetitions Hold each exercise for 2 seconds to feel your location  AT SINK FIND YOUR MIDLINE POSITION AND PLACE FEET EQUAL DISTANCE FROM THE MIDLINE.  Try to find this position when standing still for activities.   USE TAPE ON FLOOR TO MARK THE MIDLINE POSITION which is even with middle of sink.  You also should try to feel with your limb pressure in socket.  You are trying to feel with limb what you used to feel with the bottom of your foot.  Side to Side Shift: Moving your hips only (not shoulders): move weight onto your left leg, HOLD/FEEL pressure in socket.  Move back to equal weight on each leg, HOLD/FEEL pressure in socket. Move weight onto your right leg, HOLD/FEEL pressure in socket. Move back to equal weight on each leg, HOLD/FEEL pressure in socket. Repeat.  Start with both hands on sink, progress to hand on prosthetic side only, then no hands.  Front to Back Shift: Moving your hips only (not shoulders): move your weight forward onto your toes, HOLD/FEEL pressure in socket. Move your weight back to equal Flat Foot on both legs, HOLD/FEEL  pressure in socket.  Move your weight back onto your heels, HOLD/FEEL  pressure in socket. Move  your weight back to equal on both legs, HOLD/FEEL  pressure in socket. Repeat.  Start with both hands on sink, progress to hand on prosthetic side only, then no hands.  Moving Cones / Cups: With equal weight on each leg: Hold on with one hand the first time, then progress to no hand supports. Move cups from one side of sink to the other. Place cups ~2" out of your reach, progress to 10" beyond reach.  Place one hand in middle of sink and reach with other hand. Do both arms.  Then hover one hand and move cups with other hand.  Overhead/Upward Reaching: alternated reaching up to top cabinets or ceiling if no cabinets present. Keep equal weight on each leg. Start with one hand support on counter while other hand reaches and progress to no hand support with reaching.  ace one hand in middle of sink and reach with other hand. Do both arms.  Then hover one hand and move cups with other hand.  5.   Looking Over Shoulders: With equal weight on each leg: alternate turning to look over your shoulders with one hand support on counter as needed.  Start with head motions only to look in front of shoulder, then even with shoulder and progress to looking behind you. To look to side, move head /eyes, then shoulder on side looking pulls back, shift more weight to side looking and pull hip back. Place one hand in middle of sink and let go with other hand so your shoulder can pull back. Switch hands to look other way.   Then hover one hand and look over shoulder. If looking right, use left hand at sink. If looking left, use right hand at sink. 6.  Stepping with leg that is not amputated:  Move items under cabinet out of your way. Shift your hips/pelvis so weight on prosthesis. Tighten muscles in hip on prosthetic side.  SLOWLY step other leg so front of foot is in cabinet. Then step back to floor.    ASSESSMENT:  CLINICAL IMPRESSION: Patient arrived to session noting difficulty with hip flexor stretch secondary to tightness,  but was able to perform all activities at home with no issues. Patient also noting that he is ambulating for around 35 minutes within the home. Patient continues to show motivation to return to as independent as possible. Patient will continue to benefit from skilled PT.   OBJECTIVE IMPAIRMENTS: Abnormal gait, decreased activity tolerance, decreased balance, decreased endurance, decreased knowledge of condition, decreased knowledge of use of DME, decreased mobility, difficulty walking, decreased ROM, decreased strength, impaired flexibility, postural dysfunction, prosthetic dependency , and pain.   ACTIVITY LIMITATIONS: carrying, lifting, bending, sitting, standing, squatting, stairs, transfers, bathing, toileting, dressing, reach over head, and locomotion level  PARTICIPATION LIMITATIONS: meal prep, cleaning, laundry, driving, shopping, community activity, and yard work  PERSONAL FACTORS: Age, Fitness, Past/current experiences, Time since onset of injury/illness/exacerbation, and 3+ comorbidities: see PMH are also affecting patient's functional outcome.   REHAB POTENTIAL: Good  CLINICAL DECISION MAKING: Evolving/moderate complexity  EVALUATION COMPLEXITY: Moderate   GOALS: Goals reviewed with patient? Yes  SHORT TERM GOALS: Target date: 04/05/2024  Patient donnes prosthesis modified independent & verbalizes proper cleaning. Baseline: SEE OBJECTIVE DATA Goal status: Ongoing   03/20/2024  2.  Patient tolerates prosthesis >12 hrs total /day without skin issues or limb pain >7/10 after standing. Baseline: SEE OBJECTIVE DATA Goal status:  Ongoing   03/20/2024  3.  Patient able to reach 7 and look over both shoulders with RW support with Modified independent. Baseline: SEE OBJECTIVE DATA Goal status: Ongoing   03/20/2024  4. Patient ambulates 125' with RW & prosthesis with supervision. Baseline: SEE OBJECTIVE DATA Goal status: Ongoing   03/20/2024  5. Patient negotiates ramps & curbs  with RW & prosthesis with supervision. Baseline: SEE OBJECTIVE DATA Goal status: Ongoing   03/20/2024  LONG TERM GOALS: Target date: 05/31/2024  Patient demonstrates & verbalized understanding of prosthetic care to enable safe utilization of prosthesis. Baseline: SEE OBJECTIVE DATA Goal status: Ongoing   03/20/2024  Patient tolerates prosthesis wear >90% of awake hours without skin issues or limb pain >4/10. Baseline: SEE OBJECTIVE DATA Goal status: Ongoing   03/20/2024  BERG >/= 25/56 to indicate lower fall risk Baseline: SEE OBJECTIVE DATA Goal status: Ongoing   03/20/2024  Patient ambulates >300' with prosthesis and LRAD independently Baseline: SEE OBJECTIVE DATA Goal status: Ongoing   03/20/2024  Patient negotiates ramps, curbs & stairs with single rail with prosthesis and LRAD independently. Baseline: SEE OBJECTIVE DATA Goal status: Ongoing   03/20/2024  6.  Patient reports Patient-Specific Activity Score improved the average by 3 to indicate improvement in functional activities.   Baseline: SEE OBJECTIVE DATA Goal status: Ongoing   03/20/2024  PLAN:  PT FREQUENCY: 2x/week  PT DURATION: 12 weeks  PLANNED INTERVENTIONS: 97164- PT Re-evaluation, 97750- Physical Performance Testing, 97110-Therapeutic exercises, 97530- Therapeutic activity, 97112- Neuromuscular re-education, 97535- Self Care, 02859- Manual therapy, [phone]- Gait training, [phone]- Prosthetic Initial , [phone]- Orthotic/Prosthetic subsequent, Patient/Family education, Balance training, Stair training, Joint mobilization, and DME instructions  PLAN FOR NEXT SESSION:    continued prosthetic gait training w/ RW including ramp and curb negotiation, gait training with SPC with quad tip for short distances, upright posture, forward gaze   Susannah Daring, PT, DPT 03/22/24 4:14 PM     Date of referral: 02/13/2024 Referring provider: Harden Jerona GAILS, MD Referring diagnosis? S10.387 (ICD-10-CM) - Hx of AKA (above knee  amputation), left (HCC)  Treatment diagnosis? (if different than referring diagnosis) R26.89, M62.81, M79.605, M25.652, R29.3, R26.81, L98.498  What was this (referring dx) caused by? Other: Lt AKA  Nature of Condition: Initial Onset (within last 3 months)   Laterality: Lt  Current Functional Measure Score: Patient Specific Functional Scale Score:4  Objective measurements identify impairments when they are compared to normal values, the uninvolved extremity, and prior level of function.  [x]  Yes  []  No  Objective assessment of functional ability: Moderate functional limitations   Briefly describe symptoms: Patient is dependent in prosthetic care and use with high risk of falls. Gait deviation, muscle weakness, pain in Lt residual limb, hip flexion contracture.  How did symptoms start: amputation due to vascular issues  Average pain intensity:  Last 24 hours: 8-9/10  Past week: 8-9/10  How often does the pt experience symptoms? Constantly  How much have the symptoms interfered with usual daily activities? Quite a bit  How has condition changed since care began at this facility? NA - initial visit  In general, how is the patients overall health? Good   BACK PAIN (STarT Back Screening Tool) No

## 2024-03-22 ENCOUNTER — Ambulatory Visit

## 2024-03-22 DIAGNOSIS — R2689 Other abnormalities of gait and mobility: Secondary | ICD-10-CM

## 2024-03-22 DIAGNOSIS — M25652 Stiffness of left hip, not elsewhere classified: Secondary | ICD-10-CM | POA: Diagnosis not present

## 2024-03-22 DIAGNOSIS — L98498 Non-pressure chronic ulcer of skin of other sites with other specified severity: Secondary | ICD-10-CM | POA: Diagnosis not present

## 2024-03-22 DIAGNOSIS — R2681 Unsteadiness on feet: Secondary | ICD-10-CM

## 2024-03-22 DIAGNOSIS — M6281 Muscle weakness (generalized): Secondary | ICD-10-CM

## 2024-03-22 DIAGNOSIS — M79605 Pain in left leg: Secondary | ICD-10-CM | POA: Diagnosis not present

## 2024-03-22 DIAGNOSIS — R293 Abnormal posture: Secondary | ICD-10-CM | POA: Diagnosis not present

## 2024-03-26 ENCOUNTER — Encounter: Payer: Self-pay | Admitting: Physical Therapy

## 2024-03-26 ENCOUNTER — Ambulatory Visit: Admitting: Physical Therapy

## 2024-03-26 DIAGNOSIS — R293 Abnormal posture: Secondary | ICD-10-CM | POA: Diagnosis not present

## 2024-03-26 DIAGNOSIS — R2681 Unsteadiness on feet: Secondary | ICD-10-CM

## 2024-03-26 DIAGNOSIS — M79605 Pain in left leg: Secondary | ICD-10-CM | POA: Diagnosis not present

## 2024-03-26 DIAGNOSIS — M25652 Stiffness of left hip, not elsewhere classified: Secondary | ICD-10-CM | POA: Diagnosis not present

## 2024-03-26 DIAGNOSIS — M6281 Muscle weakness (generalized): Secondary | ICD-10-CM

## 2024-03-26 DIAGNOSIS — R2689 Other abnormalities of gait and mobility: Secondary | ICD-10-CM | POA: Diagnosis not present

## 2024-03-26 NOTE — Therapy (Addendum)
 OUTPATIENT PHYSICAL THERAPY PROSTHETIC TREATMENT   Patient Name: Mike Floyd MRN: [id number] DOB:[birth year], 63 y.o., male Today's Date: 03/26/2024  END OF SESSION:  PT End of Session - 03/26/24 1607     Visit Number 4    Number of Visits 25    Date for PT Re-Evaluation 05/31/24    Authorization Type [employer] MEDICARE    Authorization Time Period $45 copay    Progress Note Due on Visit 10    PT Start Time 1515    PT Stop Time 1600    PT Time Calculation (min) 45 min    Equipment Utilized During Treatment Gait belt    Activity Tolerance Patient tolerated treatment well    Behavior During Therapy WFL for tasks assessed/performed             Past Medical History:  Diagnosis Date   AICD (automatic cardioverter/defibrillator) present 11/2007   Removed in 2018, a. 11/2007 S[facility] Current VR - single lead ICD  - Removed 2018 - it was burning me   Anxiety    CAD (coronary artery disease)    non-obstructive CAD by Cor CT in 2020 // Myoview  3/22: EF 57, small inf-sept defect c/w scar, no ischemia; low risk      Chest pain    a. 10/2007 Cath:  normal Cors.   CKD (chronic kidney disease), stage II    DDD (degenerative disc disease), lumbar    Diabetes mellitus DX: 2010   type 2   Erosive esophagitis    a. per EGD (08/2011), Dr. Golda - Erosive reflux esophagitis improved but not completely healed since previous EGD 3 years ago. Bx showing  ulcerated gatroesophageal junction mucosa. negative for H. pylori   GERD (gastroesophageal reflux disease)    Gout    Hearing deficit    a. wear bilateral hearing aides   History of hiatal hernia    History of kidney stones    passed stones, no surgery   Hypertension    Mildly dilatd aortic root (HCC)    CMR 4/22: EF 52, no LGE; d/w Dr. Ione root 38 mm (mildly dilated)   Myocardial infarction [facility]) 2011   Neuropathy    Feet and legs   Nonischemic dilated cardiomyopathy (HCC)    a. H/O EF as low as 35-40% by LV gram 10/2007;  b. Echo  02/2011 EF 50-55%, inf HK, Gr 1 DD // CMR 4/22: EF 52, no LGE; d/w Dr. Ione root 38 mm (mildly dilated)    Renal insufficiency    Sleep apnea    pt doesnt use, statesI cant afford one. PCP aware   Stroke (HCC)    mini-stroke in 2014   TIA (transient ischemic attack)    July, 2013   Tobacco abuse, in remission 06/27/2009   Discontinued in 2009     Wears dentures    top plate   WPW (Wolff-Parkinson-White syndrome)    a. s/p RFCA @ [facility] - 1999   Past Surgical History:  Procedure Laterality Date   AMPUTATION Left 05/19/2022   Procedure: TRANSMETATARSAL AMPUTATION LEFT FOOT;  Surgeon: Harden Jerona GAILS, MD;  Location: [facility] OR;  Service: Orthopedics;  Laterality: Left;   AMPUTATION Left 06/17/2023   Procedure: LEFT BELOW KNEE AMPUTATION;  Surgeon: Harden Jerona GAILS, MD;  Location: [facility] OR;  Service: Orthopedics;  Laterality: Left;   AMPUTATION Left 08/19/2023   Procedure: LEFT ABOVE KNEE AMPUTATION;  Surgeon: Harden Jerona GAILS, MD;  Location: [facility] OR;  Service: Orthopedics;  Laterality:  Left;   APPENDECTOMY     BACK SURGERY     1995   CARDIAC CATHETERIZATION     2009   CARDIAC DEFIBRILLATOR PLACEMENT     ICD was removed in 2018   CHOLECYSTECTOMY     CHOLECYSTECTOMY, LAPAROSCOPIC     11/2007   COLONOSCOPY W/ POLYPECTOMY  2009   ELBOW SURGERY Left 06/2010   ESOPHAGEAL DILATION N/A 11/08/2014   Procedure: ESOPHAGEAL DILATION;  Surgeon: Claudis RAYMOND Rivet, MD;  Location: [facility] ORS;  Service: Endoscopy;  Laterality: N/A;  #56,    ESOPHAGOGASTRODUODENOSCOPY  03/31/2012   also 08/2011; [name]   ESOPHAGOGASTRODUODENOSCOPY (EGD) WITH PROPOFOL  N/A 11/08/2014   Procedure: ESOPHAGOGASTRODUODENOSCOPY (EGD) WITH PROPOFOL ;  Surgeon: Claudis RAYMOND Rivet, MD;  Location: [facility] ORS;  Service: Endoscopy;  Laterality: N/A;  [place] is 44 , [place] is 37   FOOT ARTHRODESIS Left 10/24/2020   Procedure: LEFT GASTROCNEMIUS RECESSION, DORSIFLEXION OSTEOTOMY 1ST MT;  Surgeon: Harden Jerona GAILS, MD;  Location: [facility] OR;  Service:  Orthopedics;  Laterality: Left;   FOOT ARTHRODESIS Right 01/09/2021   Procedure: CLOSING WEDGE OSTEOTOMY RIGHT 1ST METATARSAL;  Surgeon: Harden Jerona GAILS, MD;  Location: [facility] OR;  Service: Orthopedics;  Laterality: Right;   GASTROCNEMIUS RECESSION Right 01/09/2021   Procedure: RIGHT GASTROCNEMIUS RECESSION;  Surgeon: Harden Jerona GAILS, MD;  Location: [facility] OR;  Service: Orthopedics;  Laterality: Right;   ICD LEAD REMOVAL N/A 10/07/2016   Procedure: ICD LEAD REMOVAL ;  Surgeon: Danelle LELON Birmingham, MD;  Location: [facility] OR;  Service: Cardiovascular;  Laterality: N/A;   MULTIPLE TOOTH EXTRACTIONS     RADIOFREQUENCY ABLATION  1999   WPW; performed at [facility]   STUMP REVISION Left 07/22/2023   Procedure: REVISION LEFT BELOW KNEE AMPUTATION;  Surgeon: Harden Jerona GAILS, MD;  Location: [facility] OR;  Service: Orthopedics;  Laterality: Left;   TEE WITHOUT CARDIOVERSION N/A 10/07/2016   Procedure: TRANSESOPHAGEAL ECHOCARDIOGRAM (TEE);  Surgeon: Danelle LELON Birmingham, MD;  Location: [facility] OR;  Service: Cardiovascular;  Laterality: N/A;   Patient Active Problem List   Diagnosis Date Noted   Dyslipidemia 11/03/2023   Essential hypertension 11/03/2023   Type 2 diabetes mellitus with peripheral neuropathy (HCC) 11/03/2023   BPH (benign prostatic hyperplasia) 11/03/2023   GERD without esophagitis 11/03/2023   S/P AKA (above knee amputation) unilateral, left (HCC) 08/19/2023   Dehiscence of amputation stump of left lower extremity (HCC) 07/22/2023   Ischemia of site of left below knee amputation (HCC) 07/22/2023   S/P BKA (below knee amputation) unilateral, left (HCC) 07/22/2023   Below-knee amputation of left lower extremity (HCC) 06/17/2023   Acute osteomyelitis of metatarsal bone of left foot (HCC)    Subacute osteomyelitis, left ankle and foot (HCC) 05/13/2022   Non-pressure chronic ulcer of other part of right foot limited to breakdown of skin (HCC)    Contracture of right Achilles tendon    Mildly dilatd aortic root (HCC)     Contracture of left Achilles tendon    Metatarsal deformity, left    CAD (coronary artery disease)    Hallux hammertoe, left 12/11/2019   Diarrhea 06/27/2019   Diastasis recti 08/22/2018   Umbilical hernia without obstruction and without gangrene 08/22/2018   Vitamin D  deficiency 06/23/2017   Essential hypertension, benign 06/15/2017   Mixed hyperlipidemia 06/15/2017   Class 1 obesity due to excess calories with serious comorbidity and body mass index (BMI) of 31.0 to 31.9 in adult 06/15/2017   Malfunction of implantable cardioverter-defibrillator (ICD) electrode 10/07/2016  Chronic kidney disease (CKD), stage III (moderate) (HCC) 04/04/2015   Bladder neck obstruction 03/21/2015   Difficult or painful urination 03/21/2015   Flank pain 03/21/2015   Delayed onset of urination 03/21/2015   Calculus of kidney 03/21/2015   Erosive esophagitis 12/27/2013   Hypotension due to drugs 07/02/2013   Chronic systolic heart failure (HCC) 04/24/2013   CAP (community acquired pneumonia) 03/16/2013   HTN (hypertension) 03/15/2013   Hydrocele 03/15/2013   Spermatocele 03/15/2013   DOE (dyspnea on exertion) 03/15/2013   OSA (obstructive sleep apnea) 01/09/2013   Chronic kidney disease, stage 3 (HCC) 01/03/2013   Chest pain 12/26/2012   History of diagnostic tests 09/06/2012   TIA (transient ischemic attack)    Cardiomyopathy, nonischemic (HCC) 02/24/2010   AICD (automatic cardioverter/defibrillator) 02/24/2010   DM type 2 causing vascular disease (HCC) 06/27/2009   Gout 06/27/2009   Tobacco abuse, in remission 06/27/2009   COLONIC POLYPS 10/31/2008   GERD (gastroesophageal reflux disease) 10/31/2008    PCP: Bertell Satterfield, MD  REFERRING PROVIDER: Harden Jerona GAILS, MD  REFERRING DIAG: [phone] (ICD-10-CM) - Hx of AKA (above knee amputation), left (HCC)   THERAPY DIAG:  Other abnormalities of gait and mobility  Muscle weakness (generalized)  Pain in left leg  Stiffness of left hip, not  elsewhere classified  Abnormal posture  Unsteadiness on feet  Rationale for Evaluation and Treatment: Rehabilitation  ONSET DATE: Prosthesis delivery 02/20/2024  SUBJECTIVE:   SUBJECTIVE STATEMENT:  Patient overall doing well. He drove himself to the appointment today. No complaints of pain. He reports compliance with HEP.   PERTINENT HISTORY: DM2, GERD, hypertension, dyslipidemia, CAD, L BKA (06/17/23), neuropathy  Eval: Patient is a 63 year old male who presents to physical therapy with a left AKA (08/19/2023) with a new prosthesis delivery on 02/20/2024. Patient had previous home health PT prior to Lt AKA. He is able to mobilize with modified independence w/ walker. Wears prosthesis about ten hours a day and by the end of the night he takes it off due to fatigue and pain.   PAIN:  Are you having pain? Yes: NPRS scale: none except for a brief period over the weekend Pain location: Femur in prosthesis and pain in the Rt leg as well Pain description: feels like its been hammered Aggravating factors: keeping prosthesis on for long periods of times, sitting Relieving factors: taking off prosthesis and medications  PRECAUTIONS: Fall  RED FLAGS: None   WEIGHT BEARING RESTRICTIONS: No  FALLS:  Has patient fallen in last 6 months? Yes. Number of falls 2, forgetting he doesn't have a leg and put weight onto the left side. He did not hit the floor, only fell into another person. Occurred when transferring from sitting to standing  LIVING ENVIRONMENT: Lives with: lives with their spouse Lives in: Other Trailer on grass Stairs: Yes: External: 4 steps; can reach both Has following equipment at home: Single point cane, Environmental consultant - 2 wheeled, Wheelchair (manual), and shower chair  OCCUPATION: retired   PLOF: Independent with household mobility with device  PATIENT GOALS: ambulating and ADLs with cane   NEXT MD VISIT: Office MD visit: 03/21/2024   OBJECTIVE:  Patient-Specific Activity  Scoring Scheme  0 represents "unable to perform." 10 represents "able to perform at prior level. 0 1 2 3 4 5 6 7 8 9  10 (Date and Score)   Activity Eval     1. Walking w/ cane  2    2. Standing for ADLs  5  3. Stairs 5   4.    5.    Score 4    Total score = sum of the activity scores/number of activities Minimum detectable change (90%CI) for average score = 2 points Minimum detectable change (90%CI) for single activity score = 3 points  DIAGNOSTIC FINDINGS: none post-amputation  COGNITION: Overall cognitive status: Within functional limits for tasks assessed   SENSATION: WFL  MUSCLE LENGTH:   POSTURE: rounded shoulders, forward head, flexed trunk , and weight shift right  LOWER EXTREMITY ROM:  ROM P:passive  A:active Left eval  Hip flexion   Hip extension Thomas position: P: - 40*  Hip abduction   Hip adduction   Hip internal rotation   Hip external rotation   Knee flexion   Knee extension   Ankle dorsiflexion   Ankle plantarflexion   Ankle inversion   Ankle eversion    (Blank rows = not tested)  LOWER EXTREMITY MMT:  MMT Right eval Left eval  Hip flexion 3/5 3/5  Hip extension 3/5 3-/5  Hip abduction  3-/5  Hip adduction    Hip internal rotation    Hip external rotation    Knee flexion    Knee extension 3/5   Ankle dorsiflexion    Ankle plantarflexion    Ankle inversion    Ankle eversion    At Evaluation all strength testing is grossly seated and functionally standing / gait. (Blank rows = not tested)  BED MOBILITY:  Sit to supine Complete Independence Supine to sit Complete Independence Rolling to Right Complete Independence  TRANSFERS: Sit to stand: supervision requires a stable object to arise, stabilization w/ walker using UE support, with delayed weight shift onto the prosthesis for stabilization, and uses UE to bring Lt prosthesis back below his body  Stand to sit: supervision requires a stable object to arise, stabilization  w/ walker using UE support, with delayed weight shift onto the prosthesis for stabilization  FUNCTIONAL TESTs:  BERG score 11/56   GAIT: Gait pattern: step to pattern, decreased step length- Right, decreased stance time- Left, decreased hip/knee flexion- Left, circumduction- Left, Left hip hike, antalgic, trendelenburg, lateral lean- Right, trunk flexed, abducted- Left, and poor foot clearance- Left Distance walked: 62 ft Assistive device utilized: Walker - 2 wheeled and AKA prosthesis Level of assistance: SBA / steady with RW support but needs cues  Gait velocity: 0.72 ft/sec Comments: excessive weightbearing on BUEs using RW  RAMP : not tested  CURB: not tested  CURRENT PROSTHETIC WEAR ASSESSMENT:   Patient is dependent with: skin check, residual limb care, care of non-amputated limb, prosthetic cleaning, ply sock cleaning, correct ply sock adjustment, proper wear schedule/adjustment, and proper weight-bearing schedule/adjustment Donning prosthesis: SBA / verbal cues Doffing prosthesis: Complete Independence Prosthetic wear tolerance: 6-8 hours, 1x/day, 7 days/week Prosthetic weight bearing tolerance: tolerated standing for 10 minutes with partial weight on prosthesis during BERG balance test with no increased pain. Although he reports standing pain with activities at home. Edema: minimal Residual limb condition: 3x99mm superficial wound distally located, scar with mild adherence, no adductor roll, normal skin color, decreased hair growth present Prosthetic description: silicone liner with suction ring suspension, ischial containment socket with flexible inner socket, rotator unit, SAFETY (single axis friction engaging with an extension assist), flexible keel foot   TODAY'S TREATMENT:  DATE: 03/26/2024 Prosthetic Care with AKA prosthesis Patient performed sit to  stand from wheelchair using a cane and minA Patient ambulated 30' with cane and minA.  Patient had reduced step length with step-to pattern and had difficulty with properly shifting his weight over his prosthesis during gait. PT verbally educated and demo how to perform STS with cane and from a chair with armrests and without armrests.  Patient verbalized and demoed understanding Patient ambulated around table w/ cane, using the table as support in case of a loss of balance 63' with constant Lt UE support and CGA 36' with Lt UE hovering over table and only using it as support with loss of balance.  Done with CGA.  Sat in chair with armrest for a break 64' with Lt UE hovering over table and only using it as support with loss of balance.  Patient had one LOB in which he was able to self-correct using the table. Done with supervision. Sat in chair w/o armrest for a break 88' with Lt UE hovering over table and only using it as support with loss of balance.  Patient had two LOB in which he was able to self-correct using the table. Done with supervision. Sat in chair w/o armrest for a break Patient ambulated 30' with cane and minA.  Patient had reduced step length and had difficulty with properly shifting his weight over his prosthesis during gait. Patient negotiated flight of stairs  step-to pattern using both rails for UE support.  PT verbally and physically cued patient to keep ASIS facing forward and weight shift forward over prosthesis in stance by keeping hands in front of his body while going down the stairs.  Patient verbalized and demoed understanding.  PT verbally and physically cued patient to lock out prosthesis during single-leg standing time while ascending the stairs.  Patient verbalized and demoed understanding   TODAY'S TREATMENT:                                                                                                                             DATE: 03/20/2024 Prosthetic Care with AKA  prosthesis Patient ambulated with RW and CGA-SBA for 93' and 2x35'. No instances of LOB, though did have instances of requiring readjustment of prosthesis prior to take step with Rt LE.   Patient ambulated with SPC with quad tip and CGA to max A using 3-point method for 2x29'. One instance of requiring max A secondary to Lt knee buckle and anterior LOB; patient able to readjust with max A and return to ambulating with CGA. Patient performing with decreased swing through for Rt LE secondary to lack of Lt weight shift/decreased stance time on Lt LE. Multiple verbal cues given for upright posture and forward gaze with minimal carryover as patient was more comfortable with downward gaze as he was unsteady.  Patient performed ramp x2 with RW and CGA-min A. Verbal cues given for hand placement, step length, and body position  with descent of ramp; excellent carryover from patient. Patient having more difficulty with ascent.  Patient performed one step up/down of the curb in the clinic with RW and CGA. Patient requiring cues for RW placement, though did not need cues for foot placement for ascent or descent. No requirements for increase physical assist.  Patient performing weight shifts at counter with intermittent UE use for balance, though did not need increased physical assist from PT past CGA. Patient performing 50% body weight weight shift to Lt LE for 5x with 5s hold on Lt LE. Patient then performed 2x5 with 5s hold with 75% body weight weight shift onto Lt LE. Patient performed most weight shifts with downward gaze even when given verbal cues for upright posture and forward gaze. Seated rest break between sets of 75%.  Patient performing multiple STS with RW and SPC with quad tip and CGA throughout session.     TODAY'S TREATMENT:                                                                                                                             DATE: 03/20/2024 Therapeutic exercise: PT instructed  patient in hip flexor stretch both in hook lying and Thomas position.  Patient performed left hip flexor stretch 2 reps in hook lying and 2 reps in [place] position 30 sec hold.  PT provided HO, demo and verbal cues.  Patient verbalized understanding for HEP after performing in clinic.  Neuromuscular re-education: Posture exercises for HEP including standing at doorframe reaching single upper extremity and bilateral upper extremity overhead 2 reps each to deep breaths for hold.  PT provided HO, demo and verbal cues.  Patient verbalized understanding for HEP after performing in clinic. Upright posture with posterior pelvis to counter with 2-minute hold. PT provided HO, demo and verbal cues.  Patient verbalized understanding for HEP after performing in clinic. Proprioceptive feedback for standing with equal weight on bilateral lower extremities visual cue at sink for feet placed equally off of midline and pelvic weight shift to midline.  PT cued that this stance is ideal for stationary activities.  Progressed to weight shift right and left with proprioception feedback of the socket interface. PT provided HO, demo and verbal cues.  Patient verbalized understanding for HEP after performing in clinic.  PATIENT EDUCATION: PATIENT EDUCATED ON FOLLOWING PROSTHETIC CARE: Education details: Sitting positioning to limit skin traction from weight of prosthesis, how hip flexion contracture effects residual limb pain & posture,  Prosthetic cleaning and [place] donning  Person educated: Patient, Spouse, and close family friend Education method: Explanation, Demonstration, Tactile cues, and Verbal cues Education comprehension: verbalized understanding and needs further education  HOME EXERCISE PROGRAM: Access Code: [email or web address] URL: [email or web address] Date: 03/20/2024 Prepared by: Grayce Spatz  Exercises - Modified Thomas Stretch  - 2 x daily - 7 x weekly - 1 sets - 2-3 reps - 30 seconds hold -  Hip Flexor Stretch at [facility] of [facility]  -  2 x daily - 7 x weekly - 1 sets - 2-3 reps - 30 seconds hold - Standing posture with back to counter  - 2 x daily - 7 x weekly - 1 sets - 1 reps - 5-10 minutes hold - Upright Stance at Door Frame Single Arm  - 3-6 x daily - 7 x weekly - 1 sets - 2 reps - 2 deep breathes hold - Upright Stance at Door Frame with Both Arms  - 3-6 x daily - 7 x weekly - 1 sets - 2 reps - 2 deep breathes hold  Do each exercise 1-2  times per day Do each exercise 5-10 repetitions Hold each exercise for 2 seconds to feel your location  AT SINK FIND YOUR MIDLINE POSITION AND PLACE FEET EQUAL DISTANCE FROM THE MIDLINE.  Try to find this position when standing still for activities.   USE TAPE ON FLOOR TO MARK THE MIDLINE POSITION which is even with middle of sink.  You also should try to feel with your limb pressure in socket.  You are trying to feel with limb what you used to feel with the bottom of your foot.  Side to Side Shift: Moving your hips only (not shoulders): move weight onto your left leg, HOLD/FEEL pressure in socket.  Move back to equal weight on each leg, HOLD/FEEL pressure in socket. Move weight onto your right leg, HOLD/FEEL pressure in socket. Move back to equal weight on each leg, HOLD/FEEL pressure in socket. Repeat.  Start with both hands on sink, progress to hand on prosthetic side only, then no hands.  Front to Back Shift: Moving your hips only (not shoulders): move your weight forward onto your toes, HOLD/FEEL pressure in socket. Move your weight back to equal Flat Foot on both legs, HOLD/FEEL  pressure in socket. Move your weight back onto your heels, HOLD/FEEL  pressure in socket. Move your weight back to equal on both legs, HOLD/FEEL  pressure in socket. Repeat.  Start with both hands on sink, progress to hand on prosthetic side only, then no hands.  Moving Cones / Cups: With equal weight on each leg: Hold on with one hand the first time, then progress to no hand  supports. Move cups from one side of sink to the other. Place cups ~2" out of your reach, progress to 10" beyond reach.  Place one hand in middle of sink and reach with other hand. Do both arms.  Then hover one hand and move cups with other hand.  Overhead/Upward Reaching: alternated reaching up to top cabinets or ceiling if no cabinets present. Keep equal weight on each leg. Start with one hand support on counter while other hand reaches and progress to no hand support with reaching.  ace one hand in middle of sink and reach with other hand. Do both arms.  Then hover one hand and move cups with other hand.  5.   Looking Over Shoulders: With equal weight on each leg: alternate turning to look over your shoulders with one hand support on counter as needed.  Start with head motions only to look in front of shoulder, then even with shoulder and progress to looking behind you. To look to side, move head /eyes, then shoulder on side looking pulls back, shift more weight to side looking and pull hip back. Place one hand in middle of sink and let go with other hand so your shoulder can pull back. Switch hands to look other way.   Then  hover one hand and look over shoulder. If looking right, use left hand at sink. If looking left, use right hand at sink. 6.  Stepping with leg that is not amputated:  Move items under cabinet out of your way. Shift your hips/pelvis so weight on prosthesis. Tighten muscles in hip on prosthetic side.  SLOWLY step other leg so front of foot is in cabinet. Then step back to floor.    ASSESSMENT:  CLINICAL IMPRESSION: Patient overall doing well.  This is his second session using the cane for ambulation and the patient is progressing well.  Patient was able to progress to no UE support while ambulating with a cane.  This shows a progression in balance and strength but the patient will need to continue to work in these areas to improve safety and stability.  Patient was safe negotiating  stairs and will need additional education to demonstrate proper mechanics.  Patient will continue to benefit from skilled PT to address impairments.  OBJECTIVE IMPAIRMENTS: Abnormal gait, decreased activity tolerance, decreased balance, decreased endurance, decreased knowledge of condition, decreased knowledge of use of DME, decreased mobility, difficulty walking, decreased ROM, decreased strength, impaired flexibility, postural dysfunction, prosthetic dependency , and pain.   ACTIVITY LIMITATIONS: carrying, lifting, bending, sitting, standing, squatting, stairs, transfers, bathing, toileting, dressing, reach over head, and locomotion level  PARTICIPATION LIMITATIONS: meal prep, cleaning, laundry, driving, shopping, community activity, and yard work  PERSONAL FACTORS: Age, Fitness, Past/current experiences, Time since onset of injury/illness/exacerbation, and 3+ comorbidities: see PMH are also affecting patient's functional outcome.   REHAB POTENTIAL: Good  CLINICAL DECISION MAKING: Evolving/moderate complexity  EVALUATION COMPLEXITY: Moderate   GOALS: Goals reviewed with patient? Yes  SHORT TERM GOALS: Target date: 04/05/2024  Patient donnes prosthesis modified independent & verbalizes proper cleaning. Baseline: SEE OBJECTIVE DATA Goal status: Ongoing   03/20/2024  2.  Patient tolerates prosthesis >12 hrs total /day without skin issues or limb pain >7/10 after standing. Baseline: SEE OBJECTIVE DATA Goal status: Ongoing   03/20/2024  3.  Patient able to reach 7 and look over both shoulders with RW support with Modified independent. Baseline: SEE OBJECTIVE DATA Goal status: Ongoing   03/20/2024  4. Patient ambulates 125' with RW & prosthesis with supervision. Baseline: SEE OBJECTIVE DATA Goal status: Ongoing   03/20/2024  5. Patient negotiates ramps & curbs with RW & prosthesis with supervision. Baseline: SEE OBJECTIVE DATA Goal status: Ongoing   03/20/2024  LONG TERM GOALS:  Target date: 05/31/2024  Patient demonstrates & verbalized understanding of prosthetic care to enable safe utilization of prosthesis. Baseline: SEE OBJECTIVE DATA Goal status: Ongoing   03/20/2024  Patient tolerates prosthesis wear >90% of awake hours without skin issues or limb pain >4/10. Baseline: SEE OBJECTIVE DATA Goal status: Ongoing   03/20/2024  BERG >/= 25/56 to indicate lower fall risk Baseline: SEE OBJECTIVE DATA Goal status: Ongoing   03/20/2024  Patient ambulates >300' with prosthesis and LRAD independently Baseline: SEE OBJECTIVE DATA Goal status: Ongoing   03/20/2024  Patient negotiates ramps, curbs & stairs with single rail with prosthesis and LRAD independently. Baseline: SEE OBJECTIVE DATA Goal status: Ongoing   03/20/2024  6.  Patient reports Patient-Specific Activity Score improved the average by 3 to indicate improvement in functional activities.   Baseline: SEE OBJECTIVE DATA Goal status: Ongoing   03/20/2024  PLAN:  PT FREQUENCY: 2x/week  PT DURATION: 12 weeks  PLANNED INTERVENTIONS: 97164- PT Re-evaluation, 97750- Physical Performance Testing, 97110-Therapeutic exercises, 97530- Therapeutic activity, [id number]- Neuromuscular re-education,  02464- Self Care, 02859- Manual therapy, [phone]- Gait training, [phone]- Prosthetic Initial , [phone]- Orthotic/Prosthetic subsequent, Patient/Family education, Balance training, Stair training, Joint mobilization, and DME instructions  PLAN FOR NEXT SESSION:  Pre-gait training working on  step through pattern with correct weight shift through prosthesis from initial contact to terminal stance, balance, and posture  Mike Floyd, SPT 03/26/24 4:09 PM   This entire session of physical therapy was performed under the direct supervision of PT signing evaluation /treatment. PT reviewed note and agrees.   Grayce Spatz, PT, DPT 03/26/2024, 8:46 PM     Date of referral: 02/13/2024 Referring provider: Harden Jerona GAILS,  MD Referring diagnosis? S10.387 (ICD-10-CM) - Hx of AKA (above knee amputation), left (HCC)  Treatment diagnosis? (if different than referring diagnosis) R26.89, M62.81, M79.605, M25.652, R29.3, R26.81, L98.498  What was this (referring dx) caused by? Other: Lt AKA  Nature of Condition: Initial Onset (within last 3 months)   Laterality: Lt  Current Functional Measure Score: Patient Specific Functional Scale Score:4  Objective measurements identify impairments when they are compared to normal values, the uninvolved extremity, and prior level of function.  [x]  Yes  []  No  Objective assessment of functional ability: Moderate functional limitations   Briefly describe symptoms: Patient is dependent in prosthetic care and use with high risk of falls. Gait deviation, muscle weakness, pain in Lt residual limb, hip flexion contracture.  How did symptoms start: amputation due to vascular issues  Average pain intensity:  Last 24 hours: 8-9/10  Past week: 8-9/10  How often does the pt experience symptoms? Constantly  How much have the symptoms interfered with usual daily activities? Quite a bit  How has condition changed since care began at this facility? NA - initial visit  In general, how is the patients overall health? Good   BACK PAIN (STarT Back Screening Tool) No

## 2024-03-28 NOTE — Therapy (Signed)
 OUTPATIENT PHYSICAL THERAPY PROSTHETIC TREATMENT   Patient Name: Mike Floyd MRN: [id number] DOB:April 24, 1961, 63 y.o., male Today's Date: 03/29/2024  END OF SESSION:  PT End of Session - 03/29/24 1418     Visit Number 5    Number of Visits 25    Date for PT Re-Evaluation 05/31/24    Authorization Type [employer] MEDICARE    Authorization Time Period $45 copay    Progress Note Due on Visit 10    PT Start Time 1429    PT Stop Time 1515    PT Time Calculation (min) 46 min    Equipment Utilized During Treatment Gait belt    Activity Tolerance Patient tolerated treatment well    Behavior During Therapy WFL for tasks assessed/performed              Past Medical History:  Diagnosis Date   AICD (automatic cardioverter/defibrillator) present 11/2007   Removed in 2018, a. 11/2007 S[facility] Current VR - single lead ICD  - Removed 2018 - it was burning me   Anxiety    CAD (coronary artery disease)    non-obstructive CAD by Cor CT in 2020 // Myoview  3/22: EF 57, small inf-sept defect c/w scar, no ischemia; low risk      Chest pain    a. 10/2007 Cath:  normal Cors.   CKD (chronic kidney disease), stage II    DDD (degenerative disc disease), lumbar    Diabetes mellitus DX: 2010   type 2   Erosive esophagitis    a. per EGD (08/2011), Dr. Golda - Erosive reflux esophagitis improved but not completely healed since previous EGD 3 years ago. Bx showing  ulcerated gatroesophageal junction mucosa. negative for H. pylori   GERD (gastroesophageal reflux disease)    Gout    Hearing deficit    a. wear bilateral hearing aides   History of hiatal hernia    History of kidney stones    passed stones, no surgery   Hypertension    Mildly dilatd aortic root (HCC)    CMR 4/22: EF 52, no LGE; d/w Dr. Ione root 38 mm (mildly dilated)   Myocardial infarction [facility]) 2011   Neuropathy    Feet and legs   Nonischemic dilated cardiomyopathy (HCC)    a. H/O EF as low as 35-40% by LV gram 10/2007;  b. Echo  02/2011 EF 50-55%, inf HK, Gr 1 DD // CMR 4/22: EF 52, no LGE; d/w Dr. Ione root 38 mm (mildly dilated)    Renal insufficiency    Sleep apnea    pt doesnt use, statesI cant afford one. PCP aware   Stroke (HCC)    mini-stroke in 2014   TIA (transient ischemic attack)    July, 2013   Tobacco abuse, in remission 06/27/2009   Discontinued in 2009     Wears dentures    top plate   WPW (Wolff-Parkinson-White syndrome)    a. s/p RFCA @ [facility] - 1999   Past Surgical History:  Procedure Laterality Date   AMPUTATION Left 05/19/2022   Procedure: TRANSMETATARSAL AMPUTATION LEFT FOOT;  Surgeon: Harden Jerona GAILS, MD;  Location: [facility] OR;  Service: Orthopedics;  Laterality: Left;   AMPUTATION Left 06/17/2023   Procedure: LEFT BELOW KNEE AMPUTATION;  Surgeon: Harden Jerona GAILS, MD;  Location: [facility] OR;  Service: Orthopedics;  Laterality: Left;   AMPUTATION Left 08/19/2023   Procedure: LEFT ABOVE KNEE AMPUTATION;  Surgeon: Harden Jerona GAILS, MD;  Location: [facility] OR;  Service: Orthopedics;  Laterality: Left;   APPENDECTOMY     BACK SURGERY     1995   CARDIAC CATHETERIZATION     2009   CARDIAC DEFIBRILLATOR PLACEMENT     ICD was removed in 2018   CHOLECYSTECTOMY     CHOLECYSTECTOMY, LAPAROSCOPIC     11/2007   COLONOSCOPY W/ POLYPECTOMY  2009   ELBOW SURGERY Left 06/2010   ESOPHAGEAL DILATION N/A 11/08/2014   Procedure: ESOPHAGEAL DILATION;  Surgeon: Claudis RAYMOND Rivet, MD;  Location: [facility] ORS;  Service: Endoscopy;  Laterality: N/A;  #56,    ESOPHAGOGASTRODUODENOSCOPY  03/31/2012   also 08/2011; [name]   ESOPHAGOGASTRODUODENOSCOPY (EGD) WITH PROPOFOL  N/A 11/08/2014   Procedure: ESOPHAGOGASTRODUODENOSCOPY (EGD) WITH PROPOFOL ;  Surgeon: Claudis RAYMOND Rivet, MD;  Location: [facility] ORS;  Service: Endoscopy;  Laterality: N/A;  Hiatus is 45 , GE [place]unction is 37   FOOT ARTHRODESIS Left 10/24/2020   Procedure: LEFT GASTROCNEMIUS RECESSION, DORSIFLEXION OSTEOTOMY 1ST MT;  Surgeon: Harden Jerona GAILS, MD;  Location: [facility] OR;  Service:  Orthopedics;  Laterality: Left;   FOOT ARTHRODESIS Right 01/09/2021   Procedure: CLOSING WEDGE OSTEOTOMY RIGHT 1ST METATARSAL;  Surgeon: Harden Jerona GAILS, MD;  Location: [facility] OR;  Service: Orthopedics;  Laterality: Right;   GASTROCNEMIUS RECESSION Right 01/09/2021   Procedure: RIGHT GASTROCNEMIUS RECESSION;  Surgeon: Harden Jerona GAILS, MD;  Location: [facility] OR;  Service: Orthopedics;  Laterality: Right;   ICD LEAD REMOVAL N/A 10/07/2016   Procedure: ICD LEAD REMOVAL ;  Surgeon: Danelle LELON Birmingham, MD;  Location: [facility] OR;  Service: Cardiovascular;  Laterality: N/A;   MULTIPLE TOOTH EXTRACTIONS     RADIOFREQUENCY ABLATION  1999   WPW; performed at [facility]   STUMP REVISION Left 07/22/2023   Procedure: REVISION LEFT BELOW KNEE AMPUTATION;  Surgeon: Harden Jerona GAILS, MD;  Location: [facility] OR;  Service: Orthopedics;  Laterality: Left;   TEE WITHOUT CARDIOVERSION N/A 10/07/2016   Procedure: TRANSESOPHAGEAL ECHOCARDIOGRAM (TEE);  Surgeon: Danelle LELON Birmingham, MD;  Location: [facility] OR;  Service: Cardiovascular;  Laterality: N/A;   Patient Active Problem List   Diagnosis Date Noted   Dyslipidemia 11/03/2023   Essential hypertension 11/03/2023   Type 2 diabetes mellitus with peripheral neuropathy (HCC) 11/03/2023   BPH (benign prostatic hyperplasia) 11/03/2023   GERD without esophagitis 11/03/2023   S/P AKA (above knee amputation) unilateral, left (HCC) 08/19/2023   Dehiscence of amputation stump of left lower extremity (HCC) 07/22/2023   Ischemia of site of left below knee amputation (HCC) 07/22/2023   S/P BKA (below knee amputation) unilateral, left (HCC) 07/22/2023   Below-knee amputation of left lower extremity (HCC) 06/17/2023   Acute osteomyelitis of metatarsal bone of left foot (HCC)    Subacute osteomyelitis, left ankle and foot (HCC) 05/13/2022   Non-pressure chronic ulcer of other part of right foot limited to breakdown of skin (HCC)    Contracture of right Achilles tendon    Mildly dilatd aortic root (HCC)     Contracture of left Achilles tendon    Metatarsal deformity, left    CAD (coronary artery disease)    Hallux hammertoe, left 12/11/2019   Diarrhea 06/27/2019   Diastasis recti 08/22/2018   Umbilical hernia without obstruction and without gangrene 08/22/2018   Vitamin D  deficiency 06/23/2017   Essential hypertension, benign 06/15/2017   Mixed hyperlipidemia 06/15/2017   Class 1 obesity due to excess calories with serious comorbidity and body mass index (BMI) of 31.0 to 31.9 in adult 06/15/2017   Malfunction of implantable cardioverter-defibrillator (ICD) electrode 10/07/2016  Chronic kidney disease (CKD), stage III (moderate) (HCC) 04/04/2015   Bladder neck obstruction 03/21/2015   Difficult or painful urination 03/21/2015   Flank pain 03/21/2015   Delayed onset of urination 03/21/2015   Calculus of kidney 03/21/2015   Erosive esophagitis 12/27/2013   Hypotension due to drugs 07/02/2013   Chronic systolic heart failure (HCC) 04/24/2013   CAP (community acquired pneumonia) 03/16/2013   HTN (hypertension) 03/15/2013   Hydrocele 03/15/2013   Spermatocele 03/15/2013   DOE (dyspnea on exertion) 03/15/2013   OSA (obstructive sleep apnea) 01/09/2013   Chronic kidney disease, stage 3 (HCC) 01/03/2013   Chest pain 12/26/2012   History of diagnostic tests 09/06/2012   TIA (transient ischemic attack)    Cardiomyopathy, nonischemic (HCC) 02/24/2010   AICD (automatic cardioverter/defibrillator) 02/24/2010   DM type 2 causing vascular disease (HCC) 06/27/2009   Gout 06/27/2009   Tobacco abuse, in remission 06/27/2009   COLONIC POLYPS 10/31/2008   GERD (gastroesophageal reflux disease) 10/31/2008    PCP: Bertell Satterfield, MD  REFERRING PROVIDER: Harden Jerona GAILS, MD  REFERRING DIAG: [id number] (ICD-10-CM) - Hx of AKA (above knee amputation), left (HCC)   THERAPY DIAG:  Other abnormalities of gait and mobility  Muscle weakness (generalized)  Pain in left leg  Stiffness of left hip, not  elsewhere classified  Abnormal posture  Unsteadiness on feet  Rationale for Evaluation and Treatment: Rehabilitation  ONSET DATE: Prosthesis delivery 02/20/2024  SUBJECTIVE:   SUBJECTIVE STATEMENT:   Patient reporting no pain and doing well with exercises at home.  PERTINENT HISTORY: DM2, GERD, hypertension, dyslipidemia, CAD, L BKA (06/17/23), neuropathy  Eval: Patient is a 63 year old male who presents to physical therapy with a left AKA (08/19/2023) with a new prosthesis delivery on 02/20/2024. Patient had previous home health PT prior to Lt AKA. He is able to mobilize with modified independence w/ walker. Wears prosthesis about ten hours a day and by the end of the night he takes it off due to fatigue and pain.   PAIN:  Are you having pain? Yes: NPRS scale: none except for a brief period over the weekend Pain location: Femur in prosthesis and pain in the Rt leg as well Pain description: feels like its been hammered Aggravating factors: keeping prosthesis on for long periods of times, sitting Relieving factors: taking off prosthesis and medications  PRECAUTIONS: Fall  RED FLAGS: None   WEIGHT BEARING RESTRICTIONS: No  FALLS:  Has patient fallen in last 6 months? Yes. Number of falls 2, forgetting he doesn't have a leg and put weight onto the left side. He did not hit the floor, only fell into another person. Occurred when transferring from sitting to standing  LIVING ENVIRONMENT: Lives with: lives with their spouse Lives in: Other Trailer on grass Stairs: Yes: External: 4 steps; can reach both Has following equipment at home: Single point cane, Environmental consultant - 2 wheeled, Wheelchair (manual), and shower chair  OCCUPATION: retired   PLOF: Independent with household mobility with device  PATIENT GOALS: ambulating and ADLs with cane   NEXT MD VISIT: Office MD visit: 03/21/2024   OBJECTIVE:  Patient-Specific Activity Scoring Scheme  0 represents "unable to perform." 10  represents "able to perform at prior level. 0 1 2 3 4 5 6 7 8 9  10 (Date and Score)   Activity Eval     1. Walking w/ cane  2    2. Standing for ADLs  5    3. Stairs 5   4.  5.    Score 4    Total score = sum of the activity scores/number of activities Minimum detectable change (90%CI) for average score = 2 points Minimum detectable change (90%CI) for single activity score = 3 points  DIAGNOSTIC FINDINGS: none post-amputation  COGNITION: Overall cognitive status: Within functional limits for tasks assessed   SENSATION: WFL  MUSCLE LENGTH:   POSTURE: rounded shoulders, forward head, flexed trunk , and weight shift right  LOWER EXTREMITY ROM:  ROM P:passive  A:active Left eval  Hip flexion   Hip extension Thomas position: P: - 40*  Hip abduction   Hip adduction   Hip internal rotation   Hip external rotation   Knee flexion   Knee extension   Ankle dorsiflexion   Ankle plantarflexion   Ankle inversion   Ankle eversion    (Blank rows = not tested)  LOWER EXTREMITY MMT:  MMT Right eval Left eval  Hip flexion 3/5 3/5  Hip extension 3/5 3-/5  Hip abduction  3-/5  Hip adduction    Hip internal rotation    Hip external rotation    Knee flexion    Knee extension 3/5   Ankle dorsiflexion    Ankle plantarflexion    Ankle inversion    Ankle eversion    At Evaluation all strength testing is grossly seated and functionally standing / gait. (Blank rows = not tested)  BED MOBILITY:  Sit to supine Complete Independence Supine to sit Complete Independence Rolling to Right Complete Independence  TRANSFERS: Sit to stand: supervision requires a stable object to arise, stabilization w/ walker using UE support, with delayed weight shift onto the prosthesis for stabilization, and uses UE to bring Lt prosthesis back below his body  Stand to sit: supervision requires a stable object to arise, stabilization w/ walker using UE support, with delayed weight shift onto  the prosthesis for stabilization  FUNCTIONAL TESTs:  BERG score 11/56   GAIT: Gait pattern: step to pattern, decreased step length- Right, decreased stance time- Left, decreased hip/knee flexion- Left, circumduction- Left, Left hip hike, antalgic, trendelenburg, lateral lean- Right, trunk flexed, abducted- Left, and poor foot clearance- Left Distance walked: 62 ft Assistive device utilized: Walker - 2 wheeled and AKA prosthesis Level of assistance: SBA / steady with RW support but needs cues  Gait velocity: 0.72 ft/sec Comments: excessive weightbearing on BUEs using RW  RAMP : not tested  CURB: not tested  CURRENT PROSTHETIC WEAR ASSESSMENT:   Patient is dependent with: skin check, residual limb care, care of non-amputated limb, prosthetic cleaning, ply sock cleaning, correct ply sock adjustment, proper wear schedule/adjustment, and proper weight-bearing schedule/adjustment Donning prosthesis: SBA / verbal cues Doffing prosthesis: Complete Independence Prosthetic wear tolerance: 6-8 hours, 1x/day, 7 days/week Prosthetic weight bearing tolerance: tolerated standing for 10 minutes with partial weight on prosthesis during BERG balance test with no increased pain. Although he reports standing pain with activities at home. Edema: minimal Residual limb condition: 3x1mm superficial wound distally located, scar with mild adherence, no adductor roll, normal skin color, decreased hair growth present Prosthetic description: silicone liner with suction ring suspension, ischial containment socket with flexible inner socket, rotator unit, SAFETY (single axis friction engaging with an extension assist), flexible keel foot  TODAY'S TREATMENT:  DATE: 03/29/2024 Prosthetic Care with AKA prosthesis  Multiple sit <> stands throughout session from arm chair and MWC with SBA; verbalizing  steps for appropriate stand to sit  Weight shifts in parallel bars with SBA using bilat UE ; verbal cues for forward gaze in order to improve posture  Pre gait performed in parallel bars with Rt UE use for stability and CGA from PT. Patient performing 10 step throughs with Rt LE with improved performance with time and repetition. Intermittent cues for forward gaze given throughout with moderate carryover.  Ambulation in parallel bars with CGA and with PT assist at superior-lateral Rt glute for weight shift onto Lt LE ~85% of the time. 2 sets of 2 full lengths of the parallel bars with patient performing appropriate and safe turn with UE assist and CGA turning toward Lt LE.  Patient able to improve toward no assist with weight shift with time and repetition.  Ambulation with SPC with quad tip and CGA-min A for 50'8 with intermittent carryover from ambulation practice in parallel bars (increased assist to min A secondary to minimal lateral LOB). Patient performing with large Lt step leading to inappropriate weight shift onto Lt LE; PT giving verbal cues for smaller step. Patient performed with appropriate step length for about 5' and then went to small steps bilat, almost a shuffling pattern.  Lateral stepping at counter with bilat UE support and CGA with verbal cues for appropriate form, to include upright posture and pointing toes and hips forward for engagement of abductors. Patient performing 2x 2 full lengths of counter in both directions with seated rest break in between sets. PT educating patient on why turning hip out is easier that true sidestepping due to abduction weakness and hip flexor strength.   Patient noting popping sensation on lateral inferior residual limb. Patient doffed prosthesis and shrinker in order to assess skin as well as appropriate placement of shrinker/prosthesis. Patient highly painful to palpation over inferior lateral femur, but no new lesions visualized. Patient donned  shrinker and prosthesis with no deficiencies. Attempted to ambulate again, though patient took 3 steps endorsing continued pain and discomfort with popping sensation. Ambulation discontinued.  Transfer into vehicle with SBA-CGA from PT with patient performing ambulation using truck as stability point. No physical assistance required for safe transfer.   TODAY'S TREATMENT:                                                                                                                             DATE: 03/26/2024 Prosthetic Care with AKA prosthesis Patient performed sit to stand from wheelchair using a cane and minA Patient ambulated 30' with cane and minA.  Patient had reduced step length with step-to pattern and had difficulty with properly shifting his weight over his prosthesis during gait. PT verbally educated and demo how to perform STS with cane and from a chair with armrests and without armrests.  Patient verbalized and demoed understanding Patient ambulated around  table w/ cane, using the table as support in case of a loss of balance 58' with constant Lt UE support and CGA 69' with Lt UE hovering over table and only using it as support with loss of balance.  Done with CGA.  Sat in chair with armrest for a break 2' with Lt UE hovering over table and only using it as support with loss of balance.  Patient had one LOB in which he was able to self-correct using the table. Done with supervision. Sat in chair w/o armrest for a break 75' with Lt UE hovering over table and only using it as support with loss of balance.  Patient had two LOB in which he was able to self-correct using the table. Done with supervision. Sat in chair w/o armrest for a break Patient ambulated 30' with cane and minA.  Patient had reduced step length and had difficulty with properly shifting his weight over his prosthesis during gait. Patient negotiated flight of stairs  step-to pattern using both rails for UE support.  PT  verbally and physically cued patient to keep ASIS facing forward and weight shift forward over prosthesis in stance by keeping hands in front of his body while going down the stairs.  Patient verbalized and demoed understanding.  PT verbally and physically cued patient to lock out prosthesis during single-leg standing time while ascending the stairs.  Patient verbalized and demoed understanding   TODAY'S TREATMENT:                                                                                                                             DATE: 03/20/2024 Prosthetic Care with AKA prosthesis Patient ambulated with RW and CGA-SBA for 93' and 2x35'. No instances of LOB, though did have instances of requiring readjustment of prosthesis prior to take step with Rt LE.   Patient ambulated with SPC with quad tip and CGA to max A using 3-point method for 2x29'. One instance of requiring max A secondary to Lt knee buckle and anterior LOB; patient able to readjust with max A and return to ambulating with CGA. Patient performing with decreased swing through for Rt LE secondary to lack of Lt weight shift/decreased stance time on Lt LE. Multiple verbal cues given for upright posture and forward gaze with minimal carryover as patient was more comfortable with downward gaze as he was unsteady.  Patient performed ramp x2 with RW and CGA-min A. Verbal cues given for hand placement, step length, and body position with descent of ramp; excellent carryover from patient. Patient having more difficulty with ascent.  Patient performed one step up/down of the curb in the clinic with RW and CGA. Patient requiring cues for RW placement, though did not need cues for foot placement for ascent or descent. No requirements for increase physical assist.  Patient performing weight shifts at counter with intermittent UE use for balance, though did not need increased physical assist from PT past CGA.  Patient performing 50% body weight weight  shift to Lt LE for 5x with 5s hold on Lt LE. Patient then performed 2x5 with 5s hold with 75% body weight weight shift onto Lt LE. Patient performed most weight shifts with downward gaze even when given verbal cues for upright posture and forward gaze. Seated rest break between sets of 75%.  Patient performing multiple STS with RW and SPC with quad tip and CGA throughout session.     TODAY'S TREATMENT:                                                                                                                             DATE: 03/20/2024 Therapeutic exercise: PT instructed patient in hip flexor stretch both in hook lying and Thomas position.  Patient performed left hip flexor stretch 2 reps in hook lying and 2 reps in McClave position 30 sec hold.  PT provided HO, demo and verbal cues.  Patient verbalized understanding for HEP after performing in clinic.  Neuromuscular re-education: Posture exercises for HEP including standing at doorframe reaching single upper extremity and bilateral upper extremity overhead 2 reps each to deep breaths for hold.  PT provided HO, demo and verbal cues.  Patient verbalized understanding for HEP after performing in clinic. Upright posture with posterior pelvis to counter with 2-minute hold. PT provided HO, demo and verbal cues.  Patient verbalized understanding for HEP after performing in clinic. Proprioceptive feedback for standing with equal weight on bilateral lower extremities visual cue at sink for feet placed equally off of midline and pelvic weight shift to midline.  PT cued that this stance is ideal for stationary activities.  Progressed to weight shift right and left with proprioception feedback of the socket interface. PT provided HO, demo and verbal cues.  Patient verbalized understanding for HEP after performing in clinic.  PATIENT EDUCATION: PATIENT EDUCATED ON FOLLOWING PROSTHETIC CARE: Education details: Sitting positioning to limit skin traction from  weight of prosthesis, how hip flexion contracture effects residual limb pain & posture,  Prosthetic cleaning and Propper donning  Person educated: Patient, Spouse, and close family friend Education method: Explanation, Demonstration, Tactile cues, and Verbal cues Education comprehension: verbalized understanding and needs further education  HOME EXERCISE PROGRAM: Access Code: [email or web address] URL: [email or web address] Date: 03/20/2024 Prepared by: Grayce Spatz  Exercises - Modified Debby Stretch  - 2 x daily - 7 x weekly - 1 sets - 2-3 reps - 30 seconds hold - Hip Flexor Stretch at Edge of Bed  - 2 x daily - 7 x weekly - 1 sets - 2-3 reps - 30 seconds hold - Standing posture with back to counter  - 2 x daily - 7 x weekly - 1 sets - 1 reps - 5-10 minutes hold - Upright Stance at Door Frame Single Arm  - 3-6 x daily - 7 x weekly - 1 sets - 2 reps - 2 deep breathes hold - Upright Stance at Door Frame with  Both Arms  - 3-6 x daily - 7 x weekly - 1 sets - 2 reps - 2 deep breathes hold  Do each exercise 1-2  times per day Do each exercise 5-10 repetitions Hold each exercise for 2 seconds to feel your location  AT SINK FIND YOUR MIDLINE POSITION AND PLACE FEET EQUAL DISTANCE FROM THE MIDLINE.  Try to find this position when standing still for activities.   USE TAPE ON FLOOR TO MARK THE MIDLINE POSITION which is even with middle of sink.  You also should try to feel with your limb pressure in socket.  You are trying to feel with limb what you used to feel with the bottom of your foot.  Side to Side Shift: Moving your hips only (not shoulders): move weight onto your left leg, HOLD/FEEL pressure in socket.  Move back to equal weight on each leg, HOLD/FEEL pressure in socket. Move weight onto your right leg, HOLD/FEEL pressure in socket. Move back to equal weight on each leg, HOLD/FEEL pressure in socket. Repeat.  Start with both hands on sink, progress to hand on prosthetic side only, then  no hands.  Front to Back Shift: Moving your hips only (not shoulders): move your weight forward onto your toes, HOLD/FEEL pressure in socket. Move your weight back to equal Flat Foot on both legs, HOLD/FEEL  pressure in socket. Move your weight back onto your heels, HOLD/FEEL  pressure in socket. Move your weight back to equal on both legs, HOLD/FEEL  pressure in socket. Repeat.  Start with both hands on sink, progress to hand on prosthetic side only, then no hands.  Moving Cones / Cups: With equal weight on each leg: Hold on with one hand the first time, then progress to no hand supports. Move cups from one side of sink to the other. Place cups ~2" out of your reach, progress to 10" beyond reach.  Place one hand in middle of sink and reach with other hand. Do both arms.  Then hover one hand and move cups with other hand.  Overhead/Upward Reaching: alternated reaching up to top cabinets or ceiling if no cabinets present. Keep equal weight on each leg. Start with one hand support on counter while other hand reaches and progress to no hand support with reaching.  ace one hand in middle of sink and reach with other hand. Do both arms.  Then hover one hand and move cups with other hand.  5.   Looking Over Shoulders: With equal weight on each leg: alternate turning to look over your shoulders with one hand support on counter as needed.  Start with head motions only to look in front of shoulder, then even with shoulder and progress to looking behind you. To look to side, move head /eyes, then shoulder on side looking pulls back, shift more weight to side looking and pull hip back. Place one hand in middle of sink and let go with other hand so your shoulder can pull back. Switch hands to look other way.   Then hover one hand and look over shoulder. If looking right, use left hand at sink. If looking left, use right hand at sink. 6.  Stepping with leg that is not amputated:  Move items under cabinet out of your way.  Shift your hips/pelvis so weight on prosthesis. Tighten muscles in hip on prosthetic side.  SLOWLY step other leg so front of foot is in cabinet. Then step back to floor.    ASSESSMENT:  CLINICAL IMPRESSION:  Patient arrived to session noting no new symptoms and continues to be compliant with HEP. Patient overall did well with weight shifts in the parallel bars, though noted decrease in performance when outside of the parallel bars. PT required to intermittently give cues throughout session for forward gaze as well as weight shift and step lengths. Patient completing session this date with complaints of pain in lateral inferior residual limb that did not resolve with doffing and donning prosthesis. Patient will continue to benefit from skilled PT.    OBJECTIVE IMPAIRMENTS: Abnormal gait, decreased activity tolerance, decreased balance, decreased endurance, decreased knowledge of condition, decreased knowledge of use of DME, decreased mobility, difficulty walking, decreased ROM, decreased strength, impaired flexibility, postural dysfunction, prosthetic dependency , and pain.   ACTIVITY LIMITATIONS: carrying, lifting, bending, sitting, standing, squatting, stairs, transfers, bathing, toileting, dressing, reach over head, and locomotion level  PARTICIPATION LIMITATIONS: meal prep, cleaning, laundry, driving, shopping, community activity, and yard work  PERSONAL FACTORS: Age, Fitness, Past/current experiences, Time since onset of injury/illness/exacerbation, and 3+ comorbidities: see PMH are also affecting patient's functional outcome.   REHAB POTENTIAL: Good  CLINICAL DECISION MAKING: Evolving/moderate complexity  EVALUATION COMPLEXITY: Moderate   GOALS: Goals reviewed with patient? Yes  SHORT TERM GOALS: Target date: 04/05/2024  Patient donnes prosthesis modified independent & verbalizes proper cleaning. Baseline: SEE OBJECTIVE DATA Goal status: Ongoing   03/20/2024  2.  Patient  tolerates prosthesis >12 hrs total /day without skin issues or limb pain >7/10 after standing. Baseline: SEE OBJECTIVE DATA Goal status: Ongoing   03/20/2024  3.  Patient able to reach 7 and look over both shoulders with RW support with Modified independent. Baseline: SEE OBJECTIVE DATA Goal status: Ongoing   03/20/2024  4. Patient ambulates 125' with RW & prosthesis with supervision. Baseline: SEE OBJECTIVE DATA Goal status: Ongoing   03/20/2024  5. Patient negotiates ramps & curbs with RW & prosthesis with supervision. Baseline: SEE OBJECTIVE DATA Goal status: Ongoing   03/20/2024  LONG TERM GOALS: Target date: 05/31/2024  Patient demonstrates & verbalized understanding of prosthetic care to enable safe utilization of prosthesis. Baseline: SEE OBJECTIVE DATA Goal status: Ongoing   03/20/2024  Patient tolerates prosthesis wear >90% of awake hours without skin issues or limb pain >4/10. Baseline: SEE OBJECTIVE DATA Goal status: Ongoing   03/20/2024  BERG >/= 25/56 to indicate lower fall risk Baseline: SEE OBJECTIVE DATA Goal status: Ongoing   03/20/2024  Patient ambulates >300' with prosthesis and LRAD independently Baseline: SEE OBJECTIVE DATA Goal status: Ongoing   03/20/2024  Patient negotiates ramps, curbs & stairs with single rail with prosthesis and LRAD independently. Baseline: SEE OBJECTIVE DATA Goal status: Ongoing   03/20/2024  6.  Patient reports Patient-Specific Activity Score improved the average by 3 to indicate improvement in functional activities.   Baseline: SEE OBJECTIVE DATA Goal status: Ongoing   03/20/2024  PLAN:  PT FREQUENCY: 2x/week  PT DURATION: 12 weeks  PLANNED INTERVENTIONS: 97164- PT Re-evaluation, 97750- Physical Performance Testing, 97110-Therapeutic exercises, 97530- Therapeutic activity, 97112- Neuromuscular re-education, 97535- Self Care, 02859- Manual therapy, [phone]- Gait training, [id number]- Prosthetic Initial , [phone]- Orthotic/Prosthetic  subsequent, Patient/Family education, Balance training, Stair training, Joint mobilization, and DME instructions  PLAN FOR NEXT SESSION:  Pre-gait training working on  step through pattern with correct weight shift through prosthesis from initial contact to terminal stance, balance, and posture, reassess pain in lateral inferior residual limb and popping sensation  Susannah Daring, PT, DPT 03/29/24 3:50 PM  Date of referral: 02/13/2024 Referring provider: Harden Jerona GAILS, MD Referring diagnosis? S10.387 (ICD-10-CM) - Hx of AKA (above knee amputation), left (HCC)  Treatment diagnosis? (if different than referring diagnosis) R26.89, M62.81, M79.605, M25.652, R29.3, R26.81, L98.498  What was this (referring dx) caused by? Other: Lt AKA  Nature of Condition: Initial Onset (within last 3 months)   Laterality: Lt  Current Functional Measure Score: Patient Specific Functional Scale Score:4  Objective measurements identify impairments when they are compared to normal values, the uninvolved extremity, and prior level of function.  [x]  Yes  []  No  Objective assessment of functional ability: Moderate functional limitations   Briefly describe symptoms: Patient is dependent in prosthetic care and use with high risk of falls. Gait deviation, muscle weakness, pain in Lt residual limb, hip flexion contracture.  How did symptoms start: amputation due to vascular issues  Average pain intensity:  Last 24 hours: 8-9/10  Past week: 8-9/10  How often does the pt experience symptoms? Constantly  How much have the symptoms interfered with usual daily activities? Quite a bit  How has condition changed since care began at this facility? NA - initial visit  In general, how is the patients overall health? Good   BACK PAIN (STarT Back Screening Tool) No

## 2024-03-29 ENCOUNTER — Ambulatory Visit

## 2024-03-29 DIAGNOSIS — R2689 Other abnormalities of gait and mobility: Secondary | ICD-10-CM

## 2024-03-29 DIAGNOSIS — R293 Abnormal posture: Secondary | ICD-10-CM | POA: Diagnosis not present

## 2024-03-29 DIAGNOSIS — M25652 Stiffness of left hip, not elsewhere classified: Secondary | ICD-10-CM

## 2024-03-29 DIAGNOSIS — M79605 Pain in left leg: Secondary | ICD-10-CM | POA: Diagnosis not present

## 2024-03-29 DIAGNOSIS — R2681 Unsteadiness on feet: Secondary | ICD-10-CM | POA: Diagnosis not present

## 2024-03-29 DIAGNOSIS — M6281 Muscle weakness (generalized): Secondary | ICD-10-CM

## 2024-04-02 ENCOUNTER — Encounter: Payer: Self-pay | Admitting: Physical Therapy

## 2024-04-02 ENCOUNTER — Ambulatory Visit: Admitting: Physical Therapy

## 2024-04-02 DIAGNOSIS — M25652 Stiffness of left hip, not elsewhere classified: Secondary | ICD-10-CM | POA: Diagnosis not present

## 2024-04-02 DIAGNOSIS — R2681 Unsteadiness on feet: Secondary | ICD-10-CM | POA: Diagnosis not present

## 2024-04-02 DIAGNOSIS — M6281 Muscle weakness (generalized): Secondary | ICD-10-CM

## 2024-04-02 DIAGNOSIS — R2689 Other abnormalities of gait and mobility: Secondary | ICD-10-CM

## 2024-04-02 DIAGNOSIS — M79605 Pain in left leg: Secondary | ICD-10-CM | POA: Diagnosis not present

## 2024-04-02 DIAGNOSIS — R293 Abnormal posture: Secondary | ICD-10-CM

## 2024-04-02 NOTE — Therapy (Cosign Needed)
 OUTPATIENT PHYSICAL THERAPY PROSTHETIC TREATMENT   Patient Name: Mike Floyd MRN: [id number] DOB:06/03/1961, 63 y.o., male Today's Date: 04/02/2024  END OF SESSION:  PT End of Session - 04/02/24 1432     Visit Number 6    Number of Visits 25    Date for PT Re-Evaluation 05/31/24    Authorization Type [employer] MEDICARE    Authorization Time Period $45 copay    Progress Note Due on Visit 10    PT Start Time 1430    PT Stop Time 1512    PT Time Calculation (min) 42 min    Equipment Utilized During Treatment Gait belt    Activity Tolerance Patient tolerated treatment well    Behavior During Therapy WFL for tasks assessed/performed               Past Medical History:  Diagnosis Date   AICD (automatic cardioverter/defibrillator) present 11/2007   Removed in 2018, a. 11/2007 SJM Current VR - single lead ICD  - Removed 2018 - it was burning me   Anxiety    CAD (coronary artery disease)    non-obstructive CAD by Cor CT in 2020 // Myoview  3/22: EF 57, small inf-sept defect c/w scar, no ischemia; low risk      Chest pain    a. 10/2007 Cath:  normal Cors.   CKD (chronic kidney disease), stage II    DDD (degenerative disc disease), lumbar    Diabetes mellitus DX: 2010   type 2   Erosive esophagitis    a. per EGD (08/2011), Dr. Golda - Erosive reflux esophagitis improved but not completely healed since previous EGD 3 years ago. Bx showing  ulcerated gatroesophageal junction mucosa. negative for H. pylori   GERD (gastroesophageal reflux disease)    Gout    Hearing deficit    a. wear bilateral hearing aides   History of hiatal hernia    History of kidney stones    passed stones, no surgery   Hypertension    Mildly dilatd aortic root (HCC)    CMR 4/22: EF 52, no LGE; d/w Dr. Ione root 38 mm (mildly dilated)   Myocardial infarction [facility]) 2011   Neuropathy    Feet and legs   Nonischemic dilated cardiomyopathy (HCC)    a. H/O EF as low as 35-40% by LV gram 10/2007;  b.  Echo 02/2011 EF 50-55%, inf HK, Gr 1 DD // CMR 4/22: EF 52, no LGE; d/w Dr. Ione root 38 mm (mildly dilated)    Renal insufficiency    Sleep apnea    pt doesnt use, statesI cant afford one. PCP aware   Stroke (HCC)    mini-stroke in 2014   TIA (transient ischemic attack)    July, 2013   Tobacco abuse, in remission 06/27/2009   Discontinued in 2009     Wears dentures    top plate   WPW (Wolff-Parkinson-White syndrome)    a. s/p RFCA @ [facility] - 1999   Past Surgical History:  Procedure Laterality Date   AMPUTATION Left 05/19/2022   Procedure: TRANSMETATARSAL AMPUTATION LEFT FOOT;  Surgeon: Harden Jerona GAILS, MD;  Location: [facility] OR;  Service: Orthopedics;  Laterality: Left;   AMPUTATION Left 06/17/2023   Procedure: LEFT BELOW KNEE AMPUTATION;  Surgeon: Harden Jerona GAILS, MD;  Location: [facility] OR;  Service: Orthopedics;  Laterality: Left;   AMPUTATION Left 08/19/2023   Procedure: LEFT ABOVE KNEE AMPUTATION;  Surgeon: Harden Jerona GAILS, MD;  Location: [facility] OR;  Service: Orthopedics;  Laterality: Left;   APPENDECTOMY     BACK SURGERY     1995   CARDIAC CATHETERIZATION     2009   CARDIAC DEFIBRILLATOR PLACEMENT     ICD was removed in 2018   CHOLECYSTECTOMY     CHOLECYSTECTOMY, LAPAROSCOPIC     11/2007   COLONOSCOPY W/ POLYPECTOMY  2009   ELBOW SURGERY Left 06/2010   ESOPHAGEAL DILATION N/A 11/08/2014   Procedure: ESOPHAGEAL DILATION;  Surgeon: Claudis RAYMOND Rivet, MD;  Location: [facility] ORS;  Service: Endoscopy;  Laterality: N/A;  #56,    ESOPHAGOGASTRODUODENOSCOPY  03/31/2012   also 08/2011; [name]   ESOPHAGOGASTRODUODENOSCOPY (EGD) WITH PROPOFOL  N/A 11/08/2014   Procedure: ESOPHAGOGASTRODUODENOSCOPY (EGD) WITH PROPOFOL ;  Surgeon: Claudis RAYMOND Rivet, MD;  Location: [facility] ORS;  Service: Endoscopy;  Laterality: N/A;  [place] is 59 , [place] is 37   FOOT ARTHRODESIS Left 10/24/2020   Procedure: LEFT GASTROCNEMIUS RECESSION, DORSIFLEXION OSTEOTOMY 1ST MT;  Surgeon: Harden Jerona GAILS, MD;  Location: [facility] OR;   Service: Orthopedics;  Laterality: Left;   FOOT ARTHRODESIS Right 01/09/2021   Procedure: CLOSING WEDGE OSTEOTOMY RIGHT 1ST METATARSAL;  Surgeon: Harden Jerona GAILS, MD;  Location: [facility] OR;  Service: Orthopedics;  Laterality: Right;   GASTROCNEMIUS RECESSION Right 01/09/2021   Procedure: RIGHT GASTROCNEMIUS RECESSION;  Surgeon: Harden Jerona GAILS, MD;  Location: [facility] OR;  Service: Orthopedics;  Laterality: Right;   ICD LEAD REMOVAL N/A 10/07/2016   Procedure: ICD LEAD REMOVAL ;  Surgeon: Danelle LELON Birmingham, MD;  Location: [facility] OR;  Service: Cardiovascular;  Laterality: N/A;   MULTIPLE TOOTH EXTRACTIONS     RADIOFREQUENCY ABLATION  1999   WPW; performed [facility]   STUMP REVISION Left 07/22/2023   Procedure: REVISION LEFT BELOW KNEE AMPUTATION;  Surgeon: Harden Jerona GAILS, MD;  Location: [facility] OR;  Service: Orthopedics;  Laterality: Left;   TEE WITHOUT CARDIOVERSION N/A 10/07/2016   Procedure: TRANSESOPHAGEAL ECHOCARDIOGRAM (TEE);  Surgeon: Danelle LELON Birmingham, MD;  Location: [facility] OR;  Service: Cardiovascular;  Laterality: N/A;   Patient Active Problem List   Diagnosis Date Noted   Dyslipidemia 11/03/2023   Essential hypertension 11/03/2023   Type 2 diabetes mellitus with peripheral neuropathy (HCC) 11/03/2023   BPH (benign prostatic hyperplasia) 11/03/2023   GERD without esophagitis 11/03/2023   S/P AKA (above knee amputation) unilateral, left (HCC) 08/19/2023   Dehiscence of amputation stump of left lower extremity (HCC) 07/22/2023   Ischemia of site of left below knee amputation (HCC) 07/22/2023   S/P BKA (below knee amputation) unilateral, left (HCC) 07/22/2023   Below-knee amputation of left lower extremity (HCC) 06/17/2023   Acute osteomyelitis of metatarsal bone of left foot (HCC)    Subacute osteomyelitis, left ankle and foot (HCC) 05/13/2022   Non-pressure chronic ulcer of other part of right foot limited to breakdown of skin (HCC)    Contracture of right Achilles tendon    Mildly dilatd aortic root  (HCC)    Contracture of left Achilles tendon    Metatarsal deformity, left    CAD (coronary artery disease)    Hallux hammertoe, left 12/11/2019   Diarrhea 06/27/2019   Diastasis recti 08/22/2018   Umbilical hernia without obstruction and without gangrene 08/22/2018   Vitamin D  deficiency 06/23/2017   Essential hypertension, benign 06/15/2017   Mixed hyperlipidemia 06/15/2017   Class 1 obesity due to excess calories with serious comorbidity and body mass index (BMI) of 31.0 to 31.9 in adult 06/15/2017   Malfunction of implantable cardioverter-defibrillator (ICD) electrode 10/07/2016  Chronic kidney disease (CKD), stage III (moderate) (HCC) 04/04/2015   Bladder neck obstruction 03/21/2015   Difficult or painful urination 03/21/2015   Flank pain 03/21/2015   Delayed onset of urination 03/21/2015   Calculus of kidney 03/21/2015   Erosive esophagitis 12/27/2013   Hypotension due to drugs 07/02/2013   Chronic systolic heart failure (HCC) 04/24/2013   CAP (community acquired pneumonia) 03/16/2013   HTN (hypertension) 03/15/2013   Hydrocele 03/15/2013   Spermatocele 03/15/2013   DOE (dyspnea on exertion) 03/15/2013   OSA (obstructive sleep apnea) 01/09/2013   Chronic kidney disease, stage 3 (HCC) 01/03/2013   Chest pain 12/26/2012   History of diagnostic tests 09/06/2012   TIA (transient ischemic attack)    Cardiomyopathy, nonischemic (HCC) 02/24/2010   AICD (automatic cardioverter/defibrillator) 02/24/2010   DM type 2 causing vascular disease (HCC) 06/27/2009   Gout 06/27/2009   Tobacco abuse, in remission 06/27/2009   COLONIC POLYPS 10/31/2008   GERD (gastroesophageal reflux disease) 10/31/2008    PCP: Bertell Satterfield, MD  REFERRING PROVIDER: Harden Jerona GAILS, MD  REFERRING DIAG: [phone] (ICD-10-CM) - Hx of AKA (above knee amputation), left (HCC)   THERAPY DIAG:  Other abnormalities of gait and mobility  Muscle weakness (generalized)  Pain in left leg  Stiffness of  left hip, not elsewhere classified  Abnormal posture  Unsteadiness on feet  Rationale for Evaluation and Treatment: Rehabilitation  ONSET DATE: Prosthesis delivery 02/20/2024  SUBJECTIVE:   SUBJECTIVE STATEMENT:   Patient arrives to PT session with pain in residual limb. Pain has progressively been getting worse since PT session last week.    PERTINENT HISTORY: DM2, GERD, hypertension, dyslipidemia, CAD, L BKA (06/17/23), neuropathy  Eval: Patient is a 63 year old male who presents to physical therapy with a left AKA (08/19/2023) with a new prosthesis delivery on 02/20/2024. Patient had previous home health PT prior to Lt AKA. He is able to mobilize with modified independence w/ walker. Wears prosthesis about ten hours a day and by the end of the night he takes it off due to fatigue and pain.   PAIN:  Are you having pain? Yes: NPRS scale: 6-8/10 Pain location: Femur in prosthesis and pain in the Rt leg as well Pain description: feels like its been hammered Aggravating factors: keeping prosthesis on for long periods of times, sitting Relieving factors: taking off prosthesis and medications  PRECAUTIONS: Fall  RED FLAGS: None   WEIGHT BEARING RESTRICTIONS: No  FALLS:  Has patient fallen in last 6 months? Yes. Number of falls 2, forgetting he doesn't have a leg and put weight onto the left side. He did not hit the floor, only fell into another person. Occurred when transferring from sitting to standing  LIVING ENVIRONMENT: Lives with: lives with their spouse Lives in: Other Trailer on grass Stairs: Yes: External: 4 steps; can reach both Has following equipment at home: Single point cane, Environmental consultant - 2 wheeled, Wheelchair (manual), and shower chair  OCCUPATION: retired   PLOF: Independent with household mobility with device  PATIENT GOALS: ambulating and ADLs with cane   NEXT MD VISIT: Office MD visit: 03/21/2024   OBJECTIVE:  Patient-Specific Activity Scoring Scheme  0  represents "unable to perform." 10 represents "able to perform at prior level. 0 1 2 3 4 5 6 7 8 9  10 (Date and Score)   Activity Eval     1. Walking w/ cane  2    2. Standing for ADLs  5    3. Stairs 5  4.    5.    Score 4    Total score = sum of the activity scores/number of activities Minimum detectable change (90%CI) for average score = 2 points Minimum detectable change (90%CI) for single activity score = 3 points  DIAGNOSTIC FINDINGS: none post-amputation  COGNITION: Overall cognitive status: Within functional limits for tasks assessed   SENSATION: WFL  MUSCLE LENGTH:   POSTURE: rounded shoulders, forward head, flexed trunk , and weight shift right  LOWER EXTREMITY ROM:  ROM P:passive  A:active Left eval  Hip flexion   Hip extension Thomas position: P: - 40*  Hip abduction   Hip adduction   Hip internal rotation   Hip external rotation   Knee flexion   Knee extension   Ankle dorsiflexion   Ankle plantarflexion   Ankle inversion   Ankle eversion    (Blank rows = not tested)  LOWER EXTREMITY MMT:  MMT Right eval Left eval  Hip flexion 3/5 3/5  Hip extension 3/5 3-/5  Hip abduction  3-/5  Hip adduction    Hip internal rotation    Hip external rotation    Knee flexion    Knee extension 3/5   Ankle dorsiflexion    Ankle plantarflexion    Ankle inversion    Ankle eversion    At Evaluation all strength testing is grossly seated and functionally standing / gait. (Blank rows = not tested)  BED MOBILITY:  Sit to supine Complete Independence Supine to sit Complete Independence Rolling to Right Complete Independence  TRANSFERS: Sit to stand: supervision requires a stable object to arise, stabilization w/ walker using UE support, with delayed weight shift onto the prosthesis for stabilization, and uses UE to bring Lt prosthesis back below his body  Stand to sit: supervision requires a stable object to arise, stabilization w/ walker using UE  support, with delayed weight shift onto the prosthesis for stabilization  FUNCTIONAL TESTs:  BERG score 11/56   GAIT: Gait pattern: step to pattern, decreased step length- Right, decreased stance time- Left, decreased hip/knee flexion- Left, circumduction- Left, Left hip hike, antalgic, trendelenburg, lateral lean- Right, trunk flexed, abducted- Left, and poor foot clearance- Left Distance walked: 62 ft Assistive device utilized: Walker - 2 wheeled and AKA prosthesis Level of assistance: SBA / steady with RW support but needs cues  Gait velocity: 0.72 ft/sec Comments: excessive weightbearing on BUEs using RW  RAMP : not tested  CURB: not tested  CURRENT PROSTHETIC WEAR ASSESSMENT:   Patient is dependent with: skin check, residual limb care, care of non-amputated limb, prosthetic cleaning, ply sock cleaning, correct ply sock adjustment, proper wear schedule/adjustment, and proper weight-bearing schedule/adjustment Donning prosthesis: SBA / verbal cues Doffing prosthesis: Complete Independence Prosthetic wear tolerance: 6-8 hours, 1x/day, 7 days/week Prosthetic weight bearing tolerance: tolerated standing for 10 minutes with partial weight on prosthesis during BERG balance test with no increased pain. Although he reports standing pain with activities at home. Edema: minimal Residual limb condition: 3x108mm superficial wound distally located, scar with mild adherence, no adductor roll, normal skin color, decreased hair growth present Prosthetic description: silicone liner with suction ring suspension, ischial containment socket with flexible inner socket, rotator unit, SAFETY (single axis friction engaging with an extension assist), flexible keel foot  TODAY'S TREATMENT:  DATE: 04/02/2024 TherEx: Adduction ball squeezes in sitting, focusing on Lt LE primarily adducting  and squeezing  Adduction ball squeezes in standing, focusing on Lt LE primarily adducting and squeezing   Neuro Re-Ed Patient ambulated with cane and CGA. PT verbally educated patient about what an abducted gait looked like and that pain it can cause. Patient verbalized understanding. Patient used line on the floor as a guide for equal step width. Patient said his pain minimized when walking with equal step width Patient ambulated with cane and CGA focused on feeling equal step width by taking 4 steps with the correct step width and 2 steps with the prosthesis abducted out. Patient used line on the ground as visual guidance. Patient verbalized this helped Patient ambulated 40' with cane & CGA and w/o a line on the ground for visual guidance. Patient was successful  TODAY'S TREATMENT:                                                                                                                             DATE: 03/29/2024 Prosthetic Care with AKA prosthesis  Multiple sit <> stands throughout session from arm chair and MWC with SBA; verbalizing steps for appropriate stand to sit  Weight shifts in parallel bars with SBA using bilat UE ; verbal cues for forward gaze in order to improve posture  Pre gait performed in parallel bars with Rt UE use for stability and CGA from PT. Patient performing 10 step throughs with Rt LE with improved performance with time and repetition. Intermittent cues for forward gaze given throughout with moderate carryover.  Ambulation in parallel bars with CGA and with PT assist at superior-lateral Rt glute for weight shift onto Lt LE ~85% of the time. 2 sets of 2 full lengths of the parallel bars with patient performing appropriate and safe turn with UE assist and CGA turning toward Lt LE.  Patient able to improve toward no assist with weight shift with time and repetition.  Ambulation with SPC with quad tip and CGA-min A for 50'8 with intermittent carryover from ambulation  practice in parallel bars (increased assist to min A secondary to minimal lateral LOB). Patient performing with large Lt step leading to inappropriate weight shift onto Lt LE; PT giving verbal cues for smaller step. Patient performed with appropriate step length for about 5' and then went to small steps bilat, almost a shuffling pattern.  Lateral stepping at counter with bilat UE support and CGA with verbal cues for appropriate form, to include upright posture and pointing toes and hips forward for engagement of abductors. Patient performing 2x 2 full lengths of counter in both directions with seated rest break in between sets. PT educating patient on why turning hip out is easier that true sidestepping due to abduction weakness and hip flexor strength.   Patient noting popping sensation on lateral inferior residual limb. Patient doffed prosthesis and shrinker in order to assess skin as well as appropriate  placement of shrinker/prosthesis. Patient highly painful to palpation over inferior lateral femur, but no new lesions visualized. Patient donned shrinker and prosthesis with no deficiencies. Attempted to ambulate again, though patient took 3 steps endorsing continued pain and discomfort with popping sensation. Ambulation discontinued.  Transfer into vehicle with SBA-CGA from PT with patient performing ambulation using truck as stability point. No physical assistance required for safe transfer.   TODAY'S TREATMENT:                                                                                                                             DATE: 03/26/2024 Prosthetic Care with AKA prosthesis Patient performed sit to stand from wheelchair using a cane and minA Patient ambulated 30' with cane and minA.  Patient had reduced step length with step-to pattern and had difficulty with properly shifting his weight over his prosthesis during gait. PT verbally educated and demo how to perform STS with cane and from a  chair with armrests and without armrests.  Patient verbalized and demoed understanding Patient ambulated around table w/ cane, using the table as support in case of a loss of balance 20' with constant Lt UE support and CGA 98' with Lt UE hovering over table and only using it as support with loss of balance.  Done with CGA.  Sat in chair with armrest for a break 29' with Lt UE hovering over table and only using it as support with loss of balance.  Patient had one LOB in which he was able to self-correct using the table. Done with supervision. Sat in chair w/o armrest for a break 75' with Lt UE hovering over table and only using it as support with loss of balance.  Patient had two LOB in which he was able to self-correct using the table. Done with supervision. Sat in chair w/o armrest for a break Patient ambulated 30' with cane and minA.  Patient had reduced step length and had difficulty with properly shifting his weight over his prosthesis during gait. Patient negotiated flight of stairs  step-to pattern using both rails for UE support.  PT verbally and physically cued patient to keep ASIS facing forward and weight shift forward over prosthesis in stance by keeping hands in front of his body while going down the stairs.  Patient verbalized and demoed understanding.  PT verbally and physically cued patient to lock out prosthesis during single-leg standing time while ascending the stairs.  Patient verbalized and demoed understanding   TODAY'S TREATMENT:  DATE: 03/20/2024 Prosthetic Care with AKA prosthesis Patient ambulated with RW and CGA-SBA for 93' and 2x35'. No instances of LOB, though did have instances of requiring readjustment of prosthesis prior to take step with Rt LE.   Patient ambulated with SPC with quad tip and CGA to max A using 3-point method for 2x29'. One instance of  requiring max A secondary to Lt knee buckle and anterior LOB; patient able to readjust with max A and return to ambulating with CGA. Patient performing with decreased swing through for Rt LE secondary to lack of Lt weight shift/decreased stance time on Lt LE. Multiple verbal cues given for upright posture and forward gaze with minimal carryover as patient was more comfortable with downward gaze as he was unsteady.  Patient performed ramp x2 with RW and CGA-min A. Verbal cues given for hand placement, step length, and body position with descent of ramp; excellent carryover from patient. Patient having more difficulty with ascent.  Patient performed one step up/down of the curb in the clinic with RW and CGA. Patient requiring cues for RW placement, though did not need cues for foot placement for ascent or descent. No requirements for increase physical assist.  Patient performing weight shifts at counter with intermittent UE use for balance, though did not need increased physical assist from PT past CGA. Patient performing 50% body weight weight shift to Lt LE for 5x with 5s hold on Lt LE. Patient then performed 2x5 with 5s hold with 75% body weight weight shift onto Lt LE. Patient performed most weight shifts with downward gaze even when given verbal cues for upright posture and forward gaze. Seated rest break between sets of 75%.  Patient performing multiple STS with RW and SPC with quad tip and CGA throughout session.     TODAY'S TREATMENT:                                                                                                                             DATE: 03/20/2024 Therapeutic exercise: PT instructed patient in hip flexor stretch both in hook lying and Thomas position.  Patient performed left hip flexor stretch 2 reps in hook lying and 2 reps in Deer position 30 sec hold.  PT provided HO, demo and verbal cues.  Patient verbalized understanding for HEP after performing in  clinic.  Neuromuscular re-education: Posture exercises for HEP including standing at doorframe reaching single upper extremity and bilateral upper extremity overhead 2 reps each to deep breaths for hold.  PT provided HO, demo and verbal cues.  Patient verbalized understanding for HEP after performing in clinic. Upright posture with posterior pelvis to counter with 2-minute hold. PT provided HO, demo and verbal cues.  Patient verbalized understanding for HEP after performing in clinic. Proprioceptive feedback for standing with equal weight on bilateral lower extremities visual cue at sink for feet placed equally off of midline and pelvic weight shift to midline.  PT cued that this stance  is ideal for stationary activities.  Progressed to weight shift right and left with proprioception feedback of the socket interface. PT provided HO, demo and verbal cues.  Patient verbalized understanding for HEP after performing in clinic.  PATIENT EDUCATION: PATIENT EDUCATED ON FOLLOWING PROSTHETIC CARE: Education details: Sitting positioning to limit skin traction from weight of prosthesis, how hip flexion contracture effects residual limb pain & posture,  Prosthetic cleaning and Propper donning  Person educated: Patient, Spouse, and close family friend Education method: Explanation, Demonstration, Tactile cues, and Verbal cues Education comprehension: verbalized understanding and needs further education  HOME EXERCISE PROGRAM: Access Code: [email or web address] URL: [email or web address] Date: 03/20/2024 Prepared by: Grayce Spatz  Exercises - Modified Thomas Stretch  - 2 x daily - 7 x weekly - 1 sets - 2-3 reps - 30 seconds hold - Hip Flexor Stretch at Edge of Bed  - 2 x daily - 7 x weekly - 1 sets - 2-3 reps - 30 seconds hold - Standing posture with back to counter  - 2 x daily - 7 x weekly - 1 sets - 1 reps - 5-10 minutes hold - Upright Stance at Door Frame Single Arm  - 3-6 x daily - 7 x weekly - 1  sets - 2 reps - 2 deep breathes hold - Upright Stance at Door Frame with Both Arms  - 3-6 x daily - 7 x weekly - 1 sets - 2 reps - 2 deep breathes hold  Do each exercise 1-2  times per day Do each exercise 5-10 repetitions Hold each exercise for 2 seconds to feel your location  AT SINK FIND YOUR MIDLINE POSITION AND PLACE FEET EQUAL DISTANCE FROM THE MIDLINE.  Try to find this position when standing still for activities.   USE TAPE ON FLOOR TO MARK THE MIDLINE POSITION which is even with middle of sink.  You also should try to feel with your limb pressure in socket.  You are trying to feel with limb what you used to feel with the bottom of your foot.  Side to Side Shift: Moving your hips only (not shoulders): move weight onto your left leg, HOLD/FEEL pressure in socket.  Move back to equal weight on each leg, HOLD/FEEL pressure in socket. Move weight onto your right leg, HOLD/FEEL pressure in socket. Move back to equal weight on each leg, HOLD/FEEL pressure in socket. Repeat.  Start with both hands on sink, progress to hand on prosthetic side only, then no hands.  Front to Back Shift: Moving your hips only (not shoulders): move your weight forward onto your toes, HOLD/FEEL pressure in socket. Move your weight back to equal Flat Foot on both legs, HOLD/FEEL  pressure in socket. Move your weight back onto your heels, HOLD/FEEL  pressure in socket. Move your weight back to equal on both legs, HOLD/FEEL  pressure in socket. Repeat.  Start with both hands on sink, progress to hand on prosthetic side only, then no hands.  Moving Cones / Cups: With equal weight on each leg: Hold on with one hand the first time, then progress to no hand supports. Move cups from one side of sink to the other. Place cups ~2" out of your reach, progress to 10" beyond reach.  Place one hand in middle of sink and reach with other hand. Do both arms.  Then hover one hand and move cups with other hand.  Overhead/Upward Reaching:  alternated reaching up to top cabinets or ceiling if no cabinets present. Keep equal weight  on each leg. Start with one hand support on counter while other hand reaches and progress to no hand support with reaching.  ace one hand in middle of sink and reach with other hand. Do both arms.  Then hover one hand and move cups with other hand.  5.   Looking Over Shoulders: With equal weight on each leg: alternate turning to look over your shoulders with one hand support on counter as needed.  Start with head motions only to look in front of shoulder, then even with shoulder and progress to looking behind you. To look to side, move head /eyes, then shoulder on side looking pulls back, shift more weight to side looking and pull hip back. Place one hand in middle of sink and let go with other hand so your shoulder can pull back. Switch hands to look other way.   Then hover one hand and look over shoulder. If looking right, use left hand at sink. If looking left, use right hand at sink. 6.  Stepping with leg that is not amputated:  Move items under cabinet out of your way. Shift your hips/pelvis so weight on prosthesis. Tighten muscles in hip on prosthetic side.  SLOWLY step other leg so front of foot is in cabinet. Then step back to floor.    ASSESSMENT:  CLINICAL IMPRESSION: Patient overall did well today.  Patient arrived to session in large amounts of pain in the lateral distal area of his residual limb.  Upon observing his gait, patient tends to abduct his prosthesis as he ambulates.  This is most likely the cause of his pain so the focus of the session today was adducting his prosthesis during ambulation.  Patient was successful with verbal, visual, and tactile cues.  Patient will need repetitions to become typical gait.  Patient will continue to benefit from skilled PT to address impairments.   OBJECTIVE IMPAIRMENTS: Abnormal gait, decreased activity tolerance, decreased balance, decreased endurance,  decreased knowledge of condition, decreased knowledge of use of DME, decreased mobility, difficulty walking, decreased ROM, decreased strength, impaired flexibility, postural dysfunction, prosthetic dependency , and pain.   ACTIVITY LIMITATIONS: carrying, lifting, bending, sitting, standing, squatting, stairs, transfers, bathing, toileting, dressing, reach over head, and locomotion level  PARTICIPATION LIMITATIONS: meal prep, cleaning, laundry, driving, shopping, community activity, and yard work  PERSONAL FACTORS: Age, Fitness, Past/current experiences, Time since onset of injury/illness/exacerbation, and 3+ comorbidities: see PMH are also affecting patient's functional outcome.   REHAB POTENTIAL: Good  CLINICAL DECISION MAKING: Evolving/moderate complexity  EVALUATION COMPLEXITY: Moderate   GOALS: Goals reviewed with patient? Yes  SHORT TERM GOALS: Target date: 04/05/2024  Patient donnes prosthesis modified independent & verbalizes proper cleaning. Baseline: SEE OBJECTIVE DATA Goal status: Ongoing   03/20/2024  2.  Patient tolerates prosthesis >12 hrs total /day without skin issues or limb pain >7/10 after standing. Baseline: SEE OBJECTIVE DATA Goal status: Ongoing   03/20/2024  3.  Patient able to reach 7 and look over both shoulders with RW support with Modified independent. Baseline: SEE OBJECTIVE DATA Goal status: Ongoing   03/20/2024  4. Patient ambulates 125' with RW & prosthesis with supervision. Baseline: SEE OBJECTIVE DATA Goal status: Ongoing   03/20/2024  5. Patient negotiates ramps & curbs with RW & prosthesis with supervision. Baseline: SEE OBJECTIVE DATA Goal status: Ongoing   03/20/2024  LONG TERM GOALS: Target date: 05/31/2024  Patient demonstrates & verbalized understanding of prosthetic care to enable safe utilization of prosthesis. Baseline: SEE OBJECTIVE DATA Goal  status: Ongoing   03/20/2024  Patient tolerates prosthesis wear >90% of awake hours without  skin issues or limb pain >4/10. Baseline: SEE OBJECTIVE DATA Goal status: Ongoing   03/20/2024  BERG >/= 25/56 to indicate lower fall risk Baseline: SEE OBJECTIVE DATA Goal status: Ongoing   03/20/2024  Patient ambulates >300' with prosthesis and LRAD independently Baseline: SEE OBJECTIVE DATA Goal status: Ongoing   03/20/2024  Patient negotiates ramps, curbs & stairs with single rail with prosthesis and LRAD independently. Baseline: SEE OBJECTIVE DATA Goal status: Ongoing   03/20/2024  6.  Patient reports Patient-Specific Activity Score improved the average by 3 to indicate improvement in functional activities.   Baseline: SEE OBJECTIVE DATA Goal status: Ongoing   03/20/2024  PLAN:  PT FREQUENCY: 2x/week  PT DURATION: 12 weeks  PLANNED INTERVENTIONS: 97164- PT Re-evaluation, 97750- Physical Performance Testing, 97110-Therapeutic exercises, 97530- Therapeutic activity, 97112- Neuromuscular re-education, 97535- Self Care, 02859- Manual therapy, [phone]- Gait training, [phone]- Prosthetic Initial , [phone]- Orthotic/Prosthetic subsequent, Patient/Family education, Balance training, Stair training, Joint mobilization, and DME instructions  PLAN FOR NEXT SESSION:  STGs, gait pattern, balance  Ismael Theophilus Stallion, SPT 04/02/24 5:27 PM      Date of referral: 02/13/2024 Referring provider: Harden Jerona GAILS, MD Referring diagnosis? S10.387 (ICD-10-CM) - Hx of AKA (above knee amputation), left (HCC)  Treatment diagnosis? (if different than referring diagnosis) R26.89, M62.81, M79.605, M25.652, R29.3, R26.81, L98.498  What was this (referring dx) caused by? Other: Lt AKA  Nature of Condition: Initial Onset (within last 3 months)   Laterality: Lt  Current Functional Measure Score: Patient Specific Functional Scale Score:4  Objective measurements identify impairments when they are compared to normal values, the uninvolved extremity, and prior level of function.  [x]  Yes  []   No  Objective assessment of functional ability: Moderate functional limitations   Briefly describe symptoms: Patient is dependent in prosthetic care and use with high risk of falls. Gait deviation, muscle weakness, pain in Lt residual limb, hip flexion contracture.  How did symptoms start: amputation due to vascular issues  Average pain intensity:  Last 24 hours: 8-9/10  Past week: 8-9/10  How often does the pt experience symptoms? Constantly  How much have the symptoms interfered with usual daily activities? Quite a bit  How has condition changed since care began at this facility? NA - initial visit  In general, how is the patients overall health? Good   BACK PAIN (STarT Back Screening Tool) No

## 2024-04-03 DIAGNOSIS — N1831 Chronic kidney disease, stage 3a: Secondary | ICD-10-CM | POA: Diagnosis not present

## 2024-04-03 DIAGNOSIS — I43 Cardiomyopathy in diseases classified elsewhere: Secondary | ICD-10-CM | POA: Diagnosis not present

## 2024-04-03 DIAGNOSIS — E1129 Type 2 diabetes mellitus with other diabetic kidney complication: Secondary | ICD-10-CM | POA: Diagnosis not present

## 2024-04-03 DIAGNOSIS — G894 Chronic pain syndrome: Secondary | ICD-10-CM | POA: Diagnosis not present

## 2024-04-03 DIAGNOSIS — E1140 Type 2 diabetes mellitus with diabetic neuropathy, unspecified: Secondary | ICD-10-CM | POA: Diagnosis not present

## 2024-04-03 DIAGNOSIS — E1165 Type 2 diabetes mellitus with hyperglycemia: Secondary | ICD-10-CM | POA: Diagnosis not present

## 2024-04-03 DIAGNOSIS — E11319 Type 2 diabetes mellitus with unspecified diabetic retinopathy without macular edema: Secondary | ICD-10-CM | POA: Diagnosis not present

## 2024-04-03 DIAGNOSIS — M67911 Unspecified disorder of synovium and tendon, right shoulder: Secondary | ICD-10-CM | POA: Diagnosis not present

## 2024-04-03 DIAGNOSIS — M67912 Unspecified disorder of synovium and tendon, left shoulder: Secondary | ICD-10-CM | POA: Diagnosis not present

## 2024-04-04 NOTE — Therapy (Signed)
 OUTPATIENT PHYSICAL THERAPY PROSTHETIC TREATMENT   Patient Name: Mike Floyd MRN: [id number] DOB:[birth year], 63 y.o., male Today's Date: 04/05/2024  END OF SESSION:  PT End of Session - 04/05/24 1432     Visit Number 7    Number of Visits 25    Date for PT Re-Evaluation 05/31/24    Authorization Type [employer] MEDICARE    Authorization Time Period $45 copay    Progress Note Due on Visit 10    PT Start Time 1432    PT Stop Time 1513    PT Time Calculation (min) 41 min    Equipment Utilized During Treatment Gait belt    Activity Tolerance Patient tolerated treatment well    Behavior During Therapy WFL for tasks assessed/performed           Past Medical History:  Diagnosis Date   AICD (automatic cardioverter/defibrillator) present 11/2007   Removed in 2018, a. 11/2007 S[facility] Current VR - single lead ICD  - Removed 2018 - it was burning me   Anxiety    CAD (coronary artery disease)    non-obstructive CAD by Cor CT in 2020 // Myoview  3/22: EF 57, small inf-sept defect c/w scar, no ischemia; low risk      Chest pain    a. 10/2007 Cath:  normal Cors.   CKD (chronic kidney disease), stage II    DDD (degenerative disc disease), lumbar    Diabetes mellitus DX: 2010   type 2   Erosive esophagitis    a. per EGD (08/2011), Dr. Golda - Erosive reflux esophagitis improved but not completely healed since previous EGD 3 years ago. Bx showing  ulcerated gatroesophageal junction mucosa. negative for H. pylori   GERD (gastroesophageal reflux disease)    Gout    Hearing deficit    a. wear bilateral hearing aides   History of hiatal hernia    History of kidney stones    passed stones, no surgery   Hypertension    Mildly dilatd aortic root (HCC)    CMR 4/22: EF 52, no LGE; d/w Dr. Ione root 38 mm (mildly dilated)   Myocardial infarction [facility]) 2011   Neuropathy    Feet and legs   Nonischemic dilated cardiomyopathy (HCC)    a. H/O EF as low as 35-40% by LV gram 10/2007;  b. Echo 02/2011  EF 50-55%, inf HK, Gr 1 DD // CMR 4/22: EF 52, no LGE; d/w Dr. Ione root 38 mm (mildly dilated)    Renal insufficiency    Sleep apnea    pt doesnt use, statesI cant afford one. PCP aware   Stroke (HCC)    mini-stroke in 2014   TIA (transient ischemic attack)    July, 2013   Tobacco abuse, in remission 06/27/2009   Discontinued in 2009     Wears dentures    top plate   WPW (Wolff-Parkinson-White syndrome)    a. s/p RFCA @ [facility] - 1999   Past Surgical History:  Procedure Laterality Date   AMPUTATION Left 05/19/2022   Procedure: TRANSMETATARSAL AMPUTATION LEFT FOOT;  Surgeon: Harden Jerona GAILS, MD;  Location: [facility] OR;  Service: Orthopedics;  Laterality: Left;   AMPUTATION Left 06/17/2023   Procedure: LEFT BELOW KNEE AMPUTATION;  Surgeon: Harden Jerona GAILS, MD;  Location: [facility] OR;  Service: Orthopedics;  Laterality: Left;   AMPUTATION Left 08/19/2023   Procedure: LEFT ABOVE KNEE AMPUTATION;  Surgeon: Harden Jerona GAILS, MD;  Location: [facility] OR;  Service: Orthopedics;  Laterality: Left;  APPENDECTOMY     BACK SURGERY     1995   CARDIAC CATHETERIZATION     2009   CARDIAC DEFIBRILLATOR PLACEMENT     ICD was removed in 2018   CHOLECYSTECTOMY     CHOLECYSTECTOMY, LAPAROSCOPIC     11/2007   COLONOSCOPY W/ POLYPECTOMY  2009   ELBOW SURGERY Left 06/2010   ESOPHAGEAL DILATION N/A 11/08/2014   Procedure: ESOPHAGEAL DILATION;  Surgeon: Claudis RAYMOND Rivet, MD;  Location: [facility] ORS;  Service: Endoscopy;  Laterality: N/A;  #56,    ESOPHAGOGASTRODUODENOSCOPY  03/31/2012   also 08/2011; [name]   ESOPHAGOGASTRODUODENOSCOPY (EGD) WITH PROPOFOL  N/A 11/08/2014   Procedure: ESOPHAGOGASTRODUODENOSCOPY (EGD) WITH PROPOFOL ;  Surgeon: Claudis RAYMOND Rivet, MD;  Location: [facility] ORS;  Service: Endoscopy;  Laterality: N/A;  Hiatus is 2 , GE Junction is 37   FOOT ARTHRODESIS Left 10/24/2020   Procedure: LEFT GASTROCNEMIUS RECESSION, DORSIFLEXION OSTEOTOMY 1ST MT;  Surgeon: Harden Jerona GAILS, MD;  Location: [facility] OR;  Service:  Orthopedics;  Laterality: Left;   FOOT ARTHRODESIS Right 01/09/2021   Procedure: CLOSING WEDGE OSTEOTOMY RIGHT 1ST METATARSAL;  Surgeon: Harden Jerona GAILS, MD;  Location: [facility] OR;  Service: Orthopedics;  Laterality: Right;   GASTROCNEMIUS RECESSION Right 01/09/2021   Procedure: RIGHT GASTROCNEMIUS RECESSION;  Surgeon: Harden Jerona GAILS, MD;  Location: [facility] OR;  Service: Orthopedics;  Laterality: Right;   ICD LEAD REMOVAL N/A 10/07/2016   Procedure: ICD LEAD REMOVAL ;  Surgeon: Danelle LELON Birmingham, MD;  Location: [facility] OR;  Service: Cardiovascular;  Laterality: N/A;   MULTIPLE TOOTH EXTRACTIONS     RADIOFREQUENCY ABLATION  1999   WPW; performed at [facility]   STUMP REVISION Left 07/22/2023   Procedure: REVISION LEFT BELOW KNEE AMPUTATION;  Surgeon: Harden Jerona GAILS, MD;  Location: [facility] OR;  Service: Orthopedics;  Laterality: Left;   TEE WITHOUT CARDIOVERSION N/A 10/07/2016   Procedure: TRANSESOPHAGEAL ECHOCARDIOGRAM (TEE);  Surgeon: Danelle LELON Birmingham, MD;  Location: [facility] OR;  Service: Cardiovascular;  Laterality: N/A;   Patient Active Problem List   Diagnosis Date Noted   Dyslipidemia 11/03/2023   Essential hypertension 11/03/2023   Type 2 diabetes mellitus with peripheral neuropathy (HCC) 11/03/2023   BPH (benign prostatic hyperplasia) 11/03/2023   GERD without esophagitis 11/03/2023   S/P AKA (above knee amputation) unilateral, left (HCC) 08/19/2023   Dehiscence of amputation stump of left lower extremity (HCC) 07/22/2023   Ischemia of site of left below knee amputation (HCC) 07/22/2023   S/P BKA (below knee amputation) unilateral, left (HCC) 07/22/2023   Below-knee amputation of left lower extremity (HCC) 06/17/2023   Acute osteomyelitis of metatarsal bone of left foot (HCC)    Subacute osteomyelitis, left ankle and foot (HCC) 05/13/2022   Non-pressure chronic ulcer of other part of right foot limited to breakdown of skin (HCC)    Contracture of right Achilles tendon    Mildly dilatd aortic root (HCC)     Contracture of left Achilles tendon    Metatarsal deformity, left    CAD (coronary artery disease)    Hallux hammertoe, left 12/11/2019   Diarrhea 06/27/2019   Diastasis recti 08/22/2018   Umbilical hernia without obstruction and without gangrene 08/22/2018   Vitamin D  deficiency 06/23/2017   Essential hypertension, benign 06/15/2017   Mixed hyperlipidemia 06/15/2017   Class 1 obesity due to excess calories with serious comorbidity and body mass index (BMI) of 31.0 to 31.9 in adult 06/15/2017   Malfunction of implantable cardioverter-defibrillator (ICD) electrode 10/07/2016   Chronic kidney  disease (CKD), stage III (moderate) (HCC) 04/04/2015   Bladder neck obstruction 03/21/2015   Difficult or painful urination 03/21/2015   Flank pain 03/21/2015   Delayed onset of urination 03/21/2015   Calculus of kidney 03/21/2015   Erosive esophagitis 12/27/2013   Hypotension due to drugs 07/02/2013   Chronic systolic heart failure (HCC) 04/24/2013   CAP (community acquired pneumonia) 03/16/2013   HTN (hypertension) 03/15/2013   Hydrocele 03/15/2013   Spermatocele 03/15/2013   DOE (dyspnea on exertion) 03/15/2013   OSA (obstructive sleep apnea) 01/09/2013   Chronic kidney disease, stage 3 (HCC) 01/03/2013   Chest pain 12/26/2012   History of diagnostic tests 09/06/2012   TIA (transient ischemic attack)    Cardiomyopathy, nonischemic (HCC) 02/24/2010   AICD (automatic cardioverter/defibrillator) 02/24/2010   DM type 2 causing vascular disease (HCC) 06/27/2009   Gout 06/27/2009   Tobacco abuse, in remission 06/27/2009   COLONIC POLYPS 10/31/2008   GERD (gastroesophageal reflux disease) 10/31/2008    PCP: Bertell Satterfield, MD  REFERRING PROVIDER: Harden Jerona GAILS, MD  REFERRING DIAG: [phone] (ICD-10-CM) - Hx of AKA (above knee amputation), left (HCC)   THERAPY DIAG:  Other abnormalities of gait and mobility  Muscle weakness (generalized)  Stiffness of left hip, not elsewhere  classified  Abnormal posture  Unsteadiness on feet  Pain in left leg  Rationale for Evaluation and Treatment: Rehabilitation  ONSET DATE: Prosthesis delivery 02/20/2024  SUBJECTIVE:   SUBJECTIVE STATEMENT:     Pain rated at 1-2/10 in lateral distal residual limb. Has been completing adduction strengthening since last visit.    PERTINENT HISTORY: DM2, GERD, hypertension, dyslipidemia, CAD, L BKA (06/17/23), neuropathy  Eval: Patient is a 63 year old male who presents to physical therapy with a left AKA (08/19/2023) with a new prosthesis delivery on 02/20/2024. Patient had previous home health PT prior to Lt AKA. He is able to mobilize with modified independence w/ walker. Wears prosthesis about ten hours a day and by the end of the night he takes it off due to fatigue and pain.   PAIN:  Are you having pain? Yes: NPRS scale: 6-8/10 Pain location: Femur in prosthesis and pain in the Rt leg as well Pain description: feels like its been hammered Aggravating factors: keeping prosthesis on for long periods of times, sitting Relieving factors: taking off prosthesis and medications  PRECAUTIONS: Fall  RED FLAGS: None   WEIGHT BEARING RESTRICTIONS: No  FALLS:  Has patient fallen in last 6 months? Yes. Number of falls 2, forgetting he doesn't have a leg and put weight onto the left side. He did not hit the floor, only fell into another person. Occurred when transferring from sitting to standing  LIVING ENVIRONMENT: Lives with: lives with their spouse Lives in: Other Trailer on grass Stairs: Yes: External: 4 steps; can reach both Has following equipment at home: Single point cane, Environmental consultant - 2 wheeled, Wheelchair (manual), and shower chair  OCCUPATION: retired   PLOF: Independent with household mobility with device  PATIENT GOALS: ambulating and ADLs with cane   NEXT MD VISIT: Office MD visit: 03/21/2024   OBJECTIVE:  Patient-Specific Activity Scoring Scheme  0 represents  "unable to perform." 10 represents "able to perform at prior level. 0 1 2 3 4 5 6 7 8 9  10 (Date and Score)   Activity Eval     1. Walking w/ cane  2    2. Standing for ADLs  5    3. Stairs 5   4.  5.    Score 4    Total score = sum of the activity scores/number of activities Minimum detectable change (90%CI) for average score = 2 points Minimum detectable change (90%CI) for single activity score = 3 points  DIAGNOSTIC FINDINGS: none post-amputation  COGNITION: Overall cognitive status: Within functional limits for tasks assessed   SENSATION: WFL  MUSCLE LENGTH:   POSTURE: rounded shoulders, forward head, flexed trunk , and weight shift right  LOWER EXTREMITY ROM:  ROM P:passive  A:active Left eval  Hip flexion   Hip extension Thomas position: P: - 40*  Hip abduction   Hip adduction   Hip internal rotation   Hip external rotation   Knee flexion   Knee extension   Ankle dorsiflexion   Ankle plantarflexion   Ankle inversion   Ankle eversion    (Blank rows = not tested)  LOWER EXTREMITY MMT:  MMT Right eval Left eval  Hip flexion 3/5 3/5  Hip extension 3/5 3-/5  Hip abduction  3-/5  Hip adduction    Hip internal rotation    Hip external rotation    Knee flexion    Knee extension 3/5   Ankle dorsiflexion    Ankle plantarflexion    Ankle inversion    Ankle eversion    At Evaluation all strength testing is grossly seated and functionally standing / gait. (Blank rows = not tested)  BED MOBILITY:  Sit to supine Complete Independence Supine to sit Complete Independence Rolling to Right Complete Independence  TRANSFERS: Sit to stand: supervision requires a stable object to arise, stabilization w/ walker using UE support, with delayed weight shift onto the prosthesis for stabilization, and uses UE to bring Lt prosthesis back below his body  Stand to sit: supervision requires a stable object to arise, stabilization w/ walker using UE support, with  delayed weight shift onto the prosthesis for stabilization  FUNCTIONAL TESTs:  BERG score 11/56   GAIT: Gait pattern: step to pattern, decreased step length- Right, decreased stance time- Left, decreased hip/knee flexion- Left, circumduction- Left, Left hip hike, antalgic, trendelenburg, lateral lean- Right, trunk flexed, abducted- Left, and poor foot clearance- Left Distance walked: 62 ft Assistive device utilized: Walker - 2 wheeled and AKA prosthesis Level of assistance: SBA / steady with RW support but needs cues  Gait velocity: 0.72 ft/sec Comments: excessive weightbearing on BUEs using RW  RAMP : not tested  CURB: not tested  CURRENT PROSTHETIC WEAR ASSESSMENT:   Patient is dependent with: skin check, residual limb care, care of non-amputated limb, prosthetic cleaning, ply sock cleaning, correct ply sock adjustment, proper wear schedule/adjustment, and proper weight-bearing schedule/adjustment Donning prosthesis: SBA / verbal cues Doffing prosthesis: Complete Independence Prosthetic wear tolerance: 6-8 hours, 1x/day, 7 days/week Prosthetic weight bearing tolerance: tolerated standing for 10 minutes with partial weight on prosthesis during BERG balance test with no increased pain. Although he reports standing pain with activities at home. Edema: minimal Residual limb condition: 3x48mm superficial wound distally located, scar with mild adherence, no adductor roll, normal skin color, decreased hair growth present Prosthetic description: silicone liner with suction ring suspension, ischial containment socket with flexible inner socket, rotator unit, SAFETY (single axis friction engaging with an extension assist), flexible keel foot  TODAY'S TREATMENT:  DATE: 04/05/2024 Prosthetic Training with Transfemoral Prosthesis  Patient ambulating with SPC with quad tip and  CGA from PT for 68.3' to focus on appropriate weight shift and Rt swing through. Patient began ambulating with downward gaze, decreased Rt step length and increase Lt abduction. With PT verbal cues given intermittently throughout, patient began to have appropriate carryover without need for increased verbal cues.  Patient ambulating with SPC with quad tip and CGA from PT for 58.5' to leg press. Patient performed with improved performance when compared to above. Continued to require intermittent cues for upright posture and weight shift, though less than above. Patient with intermittent small shuffling steps secondary to minimal LOB; able to self correct with no need for increased physical assist.  Patient ambulating 26.7' with very close CGA and SPC with quad tip after performing leg press. Patient with increased fatigue in Rt LE and unsteady on feet, but able to complete with no need for increased assist.  Patient performed square balance on floor with SPC with quad tip and very close CGA. Patient performed with uneven step lengths, but performed weight shift onto Lt LE for Rt side step with ease. Fwd/back more difficult. Patient performing square one time in each direction before requiring rest break secondary to fear.  Patient performing fwd/back ambulation with SPC with quad tip and CGA with counter on Lt. Patient with more confidence and equal step lengths with Rt and Lt LEs. Though patient performed with equal step lengths, the step lengths were shorter than typical.  No lateral, fwd, or back LOB with no need for increased assist or UE use on counter throughout activity. Patient completed 3 fwd and 3 back before returning to seated position.  Patient performing several sit<>stands from different surfaces [facility], leg press machine, and 19 arm chair) throughout session using SPC with quad tip and SBA>CGA from PT. Patient performing with appropriate form at beginning of session, but as fatigue set in , form  began to break down, though did not require increased assist.   TherEx:  Bilat LE leg press 2x8 with 56#. Attempted 75#, but had form breakdown with Rt LE and difficulty secondary to heaviness. PT giving verbal cues to decrease Rt LE hyperextension moment with appropriate carryover from patient.  TODAY'S TREATMENT:                                                                                                                             DATE: 04/02/2024 Therapeutic Activities: PT explained to patient that AKA surgery alters adductor muscles but not abductor muscles creating an imbalance.  If abductors over power the adductors then his residual limb is pulled outward against socket creating pain where he is reporting it.  PT instructed pt in the following exercises to facilitate improved muscle balance which would keep limb more central in socket.  Pt did report less following the exercises.  Adduction ball squeezes in sitting, focusing on Lt LE primarily adducting and  squeezing 10 reps Adduction ball squeezes in standing, focusing on Lt LE primarily adducting and squeezing 10 reps PT recommended doing these exercises 2-3 times per day.  Pt verbalized understanding.   Prosthetic Training with Transfemoral Prosthesis Prosthetic gait training focusing on decreasing abduction of prosthetic limb.  PT using visual cues, then visual with proprioception followed by only proprioception. Patient ambulated 23' with cane and minA.  Using a line on floor to the erect position of the foot so the base of support was within 4 inches.  PT demo and verbal cues on not allowing some limb heel to roll into the line of progression and keeping medial prosthetic heel within 2 inches of the line. Patient ambulated 54' with cane and minA.  Patient added to the above cueing for patient to take 4 steps on the prosthetic limb with the prosthesis in the correct position and to with the prosthetic limb abducted outside the proper  position.  Patient repeated this cycle to increase proprioception of when the foot was in the proper position. Patient ambulated 4' x 2 with cane and minA.  For the initial 25' PT had the patient repeat the process of 4 steps with the prosthetic limb in the proper step width and 2 steps abducted for proprioception without the visual cue of the line.  Patient reported being able to perceive when his foot was in the proper position following above instruction and less distal lateral pain.    TREATMENT:                                                                                                                             DATE: 03/29/2024 Prosthetic Care with AKA prosthesis  Multiple sit <> stands throughout session from arm chair and MWC with SBA; verbalizing steps for appropriate stand to sit  Weight shifts in parallel bars with SBA using bilat UE ; verbal cues for forward gaze in order to improve posture  Pre gait performed in parallel bars with Rt UE use for stability and CGA from PT. Patient performing 10 step throughs with Rt LE with improved performance with time and repetition. Intermittent cues for forward gaze given throughout with moderate carryover.  Ambulation in parallel bars with CGA and with PT assist at superior-lateral Rt glute for weight shift onto Lt LE ~85% of the time. 2 sets of 2 full lengths of the parallel bars with patient performing appropriate and safe turn with UE assist and CGA turning toward Lt LE.  Patient able to improve toward no assist with weight shift with time and repetition.  Ambulation with SPC with quad tip and CGA-min A for 50'8 with intermittent carryover from ambulation practice in parallel bars (increased assist to min A secondary to minimal lateral LOB). Patient performing with large Lt step leading to inappropriate weight shift onto Lt LE; PT giving verbal cues for smaller step. Patient performed with appropriate step length for about 5' and then went to  small steps bilat, almost a shuffling pattern.  Lateral stepping at counter with bilat UE support and CGA with verbal cues for appropriate form, to include upright posture and pointing toes and hips forward for engagement of abductors. Patient performing 2x 2 full lengths of counter in both directions with seated rest break in between sets. PT educating patient on why turning hip out is easier that true sidestepping due to abduction weakness and hip flexor strength.   Patient noting popping sensation on lateral inferior residual limb. Patient doffed prosthesis and shrinker in order to assess skin as well as appropriate placement of shrinker/prosthesis. Patient highly painful to palpation over inferior lateral femur, but no new lesions visualized. Patient donned shrinker and prosthesis with no deficiencies. Attempted to ambulate again, though patient took 3 steps endorsing continued pain and discomfort with popping sensation. Ambulation discontinued.  Transfer into vehicle with SBA-CGA from PT with patient performing ambulation using truck as stability point. No physical assistance required for safe transfer.     TREATMENT:                                                                                                                             DATE: 03/26/2024 Prosthetic Care with AKA prosthesis Patient performed sit to stand from wheelchair using a cane and minA Patient ambulated 30' with cane and minA.  Patient had reduced step length with step-to pattern and had difficulty with properly shifting his weight over his prosthesis during gait. PT verbally educated and demo how to perform STS with cane and from a chair with armrests and without armrests.  Patient verbalized and demoed understanding Patient ambulated around table w/ cane, using the table as support in case of a loss of balance 27' with constant Lt UE support and CGA 29' with Lt UE hovering over table and only using it as support with  loss of balance.  Done with CGA.  Sat in chair with armrest for a break 53' with Lt UE hovering over table and only using it as support with loss of balance.  Patient had one LOB in which he was able to self-correct using the table. Done with supervision. Sat in chair w/o armrest for a break 26' with Lt UE hovering over table and only using it as support with loss of balance.  Patient had two LOB in which he was able to self-correct using the table. Done with supervision. Sat in chair w/o armrest for a break Patient ambulated 30' with cane and minA.  Patient had reduced step length and had difficulty with properly shifting his weight over his prosthesis during gait. Patient negotiated flight of stairs  step-to pattern using both rails for UE support.  PT verbally and physically cued patient to keep ASIS facing forward and weight shift forward over prosthesis in stance by keeping hands in front of his body while going down the stairs.  Patient verbalized and demoed understanding.  PT verbally and physically  cued patient to lock out prosthesis during single-leg standing time while ascending the stairs.  Patient verbalized and demoed understanding   TREATMENT:                                                                                                                             DATE: 03/20/2024 Prosthetic Care with AKA prosthesis Patient ambulated with RW and CGA-SBA for 93' and 2x35'. No instances of LOB, though did have instances of requiring readjustment of prosthesis prior to take step with Rt LE.   Patient ambulated with SPC with quad tip and CGA to max A using 3-point method for 2x29'. One instance of requiring max A secondary to Lt knee buckle and anterior LOB; patient able to readjust with max A and return to ambulating with CGA. Patient performing with decreased swing through for Rt LE secondary to lack of Lt weight shift/decreased stance time on Lt LE. Multiple verbal cues given for upright  posture and forward gaze with minimal carryover as patient was more comfortable with downward gaze as he was unsteady.  Patient performed ramp x2 with RW and CGA-min A. Verbal cues given for hand placement, step length, and body position with descent of ramp; excellent carryover from patient. Patient having more difficulty with ascent.  Patient performed one step up/down of the curb in the clinic with RW and CGA. Patient requiring cues for RW placement, though did not need cues for foot placement for ascent or descent. No requirements for increase physical assist.  Patient performing weight shifts at counter with intermittent UE use for balance, though did not need increased physical assist from PT past CGA. Patient performing 50% body weight weight shift to Lt LE for 5x with 5s hold on Lt LE. Patient then performed 2x5 with 5s hold with 75% body weight weight shift onto Lt LE. Patient performed most weight shifts with downward gaze even when given verbal cues for upright posture and forward gaze. Seated rest break between sets of 75%.  Patient performing multiple STS with RW and SPC with quad tip and CGA throughout session.     PATIENT EDUCATION: PATIENT EDUCATED ON FOLLOWING PROSTHETIC CARE: Education details: Sitting positioning to limit skin traction from weight of prosthesis, how hip flexion contracture effects residual limb pain & posture,  Prosthetic cleaning and Propper donning  Person educated: Patient, Spouse, and close family friend Education method: Explanation, Demonstration, Tactile cues, and Verbal cues Education comprehension: verbalized understanding and needs further education  HOME EXERCISE PROGRAM: Access Code: [email or web address] URL: [email or web address] Date: 03/20/2024 Prepared by: Grayce Spatz  Exercises - Modified Debby Stretch  - 2 x daily - 7 x weekly - 1 sets - 2-3 reps - 30 seconds hold - Hip Flexor Stretch at Edge of Bed  - 2 x daily - 7 x weekly - 1 sets -  2-3 reps - 30 seconds hold - Standing posture with back to counter  - 2 x daily - 7  x weekly - 1 sets - 1 reps - 5-10 minutes hold - Upright Stance at Door Frame Single Arm  - 3-6 x daily - 7 x weekly - 1 sets - 2 reps - 2 deep breathes hold - Upright Stance at Door Frame with Both Arms  - 3-6 x daily - 7 x weekly - 1 sets - 2 reps - 2 deep breathes hold  Do each exercise 1-2  times per day Do each exercise 5-10 repetitions Hold each exercise for 2 seconds to feel your location  AT SINK FIND YOUR MIDLINE POSITION AND PLACE FEET EQUAL DISTANCE FROM THE MIDLINE.  Try to find this position when standing still for activities.   USE TAPE ON FLOOR TO MARK THE MIDLINE POSITION which is even with middle of sink.  You also should try to feel with your limb pressure in socket.  You are trying to feel with limb what you used to feel with the bottom of your foot.  Side to Side Shift: Moving your hips only (not shoulders): move weight onto your left leg, HOLD/FEEL pressure in socket.  Move back to equal weight on each leg, HOLD/FEEL pressure in socket. Move weight onto your right leg, HOLD/FEEL pressure in socket. Move back to equal weight on each leg, HOLD/FEEL pressure in socket. Repeat.  Start with both hands on sink, progress to hand on prosthetic side only, then no hands.  Front to Back Shift: Moving your hips only (not shoulders): move your weight forward onto your toes, HOLD/FEEL pressure in socket. Move your weight back to equal Flat Foot on both legs, HOLD/FEEL  pressure in socket. Move your weight back onto your heels, HOLD/FEEL  pressure in socket. Move your weight back to equal on both legs, HOLD/FEEL  pressure in socket. Repeat.  Start with both hands on sink, progress to hand on prosthetic side only, then no hands.  Moving Cones / Cups: With equal weight on each leg: Hold on with one hand the first time, then progress to no hand supports. Move cups from one side of sink to the other. Place cups ~2"  out of your reach, progress to 10" beyond reach.  Place one hand in middle of sink and reach with other hand. Do both arms.  Then hover one hand and move cups with other hand.  Overhead/Upward Reaching: alternated reaching up to top cabinets or ceiling if no cabinets present. Keep equal weight on each leg. Start with one hand support on counter while other hand reaches and progress to no hand support with reaching.  ace one hand in middle of sink and reach with other hand. Do both arms.  Then hover one hand and move cups with other hand.  5.   Looking Over Shoulders: With equal weight on each leg: alternate turning to look over your shoulders with one hand support on counter as needed.  Start with head motions only to look in front of shoulder, then even with shoulder and progress to looking behind you. To look to side, move head /eyes, then shoulder on side looking pulls back, shift more weight to side looking and pull hip back. Place one hand in middle of sink and let go with other hand so your shoulder can pull back. Switch hands to look other way.   Then hover one hand and look over shoulder. If looking right, use left hand at sink. If looking left, use right hand at sink. 6.  Stepping with leg that is not  amputated:  Move items under cabinet out of your way. Shift your hips/pelvis so weight on prosthesis. Tighten muscles in hip on prosthetic side.  SLOWLY step other leg so front of foot is in cabinet. Then step back to floor.    ASSESSMENT:  CLINICAL IMPRESSION:  Patient arrived to session noting improved symptoms from the weekend and prior session as well as continuing to comply with HEP. Patient was slightly unsteady on feet at the beginning of the session which continued to increase as fatigue kicked in, but did not necessarily need any increased assist past close CGA. PT intermittently educating on fear and confidence in prosthesis and how it will improve with time. Patient will continue to benefit  from skilled PT.     OBJECTIVE IMPAIRMENTS: Abnormal gait, decreased activity tolerance, decreased balance, decreased endurance, decreased knowledge of condition, decreased knowledge of use of DME, decreased mobility, difficulty walking, decreased ROM, decreased strength, impaired flexibility, postural dysfunction, prosthetic dependency , and pain.   ACTIVITY LIMITATIONS: carrying, lifting, bending, sitting, standing, squatting, stairs, transfers, bathing, toileting, dressing, reach over head, and locomotion level  PARTICIPATION LIMITATIONS: meal prep, cleaning, laundry, driving, shopping, community activity, and yard work  PERSONAL FACTORS: Age, Fitness, Past/current experiences, Time since onset of injury/illness/exacerbation, and 3+ comorbidities: see PMH are also affecting patient's functional outcome.   REHAB POTENTIAL: Good  CLINICAL DECISION MAKING: Evolving/moderate complexity  EVALUATION COMPLEXITY: Moderate   GOALS: Goals reviewed with patient? Yes  SHORT TERM GOALS: Target date: 04/05/2024  Patient donnes prosthesis modified independent & verbalizes proper cleaning. Baseline: SEE OBJECTIVE DATA Goal status: Ongoing   03/20/2024  2.  Patient tolerates prosthesis >12 hrs total /day without skin issues or limb pain >7/10 after standing. Baseline: SEE OBJECTIVE DATA Goal status: Ongoing   03/20/2024  3.  Patient able to reach 7 and look over both shoulders with RW support with Modified independent. Baseline: SEE OBJECTIVE DATA Goal status: Ongoing   03/20/2024  4. Patient ambulates 125' with RW & prosthesis with supervision. Baseline: SEE OBJECTIVE DATA Goal status: Ongoing   03/20/2024  5. Patient negotiates ramps & curbs with RW & prosthesis with supervision. Baseline: SEE OBJECTIVE DATA Goal status: Ongoing   03/20/2024  LONG TERM GOALS: Target date: 05/31/2024  Patient demonstrates & verbalized understanding of prosthetic care to enable safe utilization of  prosthesis. Baseline: SEE OBJECTIVE DATA Goal status: Ongoing   03/20/2024  Patient tolerates prosthesis wear >90% of awake hours without skin issues or limb pain >4/10. Baseline: SEE OBJECTIVE DATA Goal status: Ongoing   03/20/2024  BERG >/= 25/56 to indicate lower fall risk Baseline: SEE OBJECTIVE DATA Goal status: Ongoing   03/20/2024  Patient ambulates >300' with prosthesis and LRAD independently Baseline: SEE OBJECTIVE DATA Goal status: Ongoing   03/20/2024  Patient negotiates ramps, curbs & stairs with single rail with prosthesis and LRAD independently. Baseline: SEE OBJECTIVE DATA Goal status: Ongoing   03/20/2024  6.  Patient reports Patient-Specific Activity Score improved the average by 3 to indicate improvement in functional activities.   Baseline: SEE OBJECTIVE DATA Goal status: Ongoing   03/20/2024  PLAN:  PT FREQUENCY: 2x/week  PT DURATION: 12 weeks  PLANNED INTERVENTIONS: 97164- PT Re-evaluation, 97750- Physical Performance Testing, 97110-Therapeutic exercises, 97530- Therapeutic activity, 97112- Neuromuscular re-education, 97535- Self Care, [id number]- Manual therapy, [phone]- Gait training, [phone]- Prosthetic Initial , [phone]- Orthotic/Prosthetic subsequent, Patient/Family education, Balance training, Stair training, Joint mobilization, and DME instructions  PLAN FOR NEXT SESSION:  check STGs, gait pattern, balance, using mirror  for visual feedback and upright posture (to also assist with confidence in prosthesis)   Susannah Daring, PT, DPT 04/05/24 4:31 PM      Date of referral: 02/13/2024 Referring provider: Harden Jerona GAILS, MD Referring diagnosis? S10.387 (ICD-10-CM) - Hx of AKA (above knee amputation), left (HCC)  Treatment diagnosis? (if different than referring diagnosis) R26.89, M62.81, M79.605, M25.652, R29.3, R26.81, L98.498  What was this (referring dx) caused by? Other: Lt AKA  Nature of Condition: Initial Onset (within last 3 months)   Laterality:  Lt  Current Functional Measure Score: Patient Specific Functional Scale Score:4  Objective measurements identify impairments when they are compared to normal values, the uninvolved extremity, and prior level of function.  [x]  Yes  []  No  Objective assessment of functional ability: Moderate functional limitations   Briefly describe symptoms: Patient is dependent in prosthetic care and use with high risk of falls. Gait deviation, muscle weakness, pain in Lt residual limb, hip flexion contracture.  How did symptoms start: amputation due to vascular issues  Average pain intensity:  Last 24 hours: 8-9/10  Past week: 8-9/10  How often does the pt experience symptoms? Constantly  How much have the symptoms interfered with usual daily activities? Quite a bit  How has condition changed since care began at this facility? NA - initial visit  In general, how is the patients overall health? Good   BACK PAIN (STarT Back Screening Tool) No

## 2024-04-05 ENCOUNTER — Ambulatory Visit

## 2024-04-05 DIAGNOSIS — M25652 Stiffness of left hip, not elsewhere classified: Secondary | ICD-10-CM

## 2024-04-05 DIAGNOSIS — M6281 Muscle weakness (generalized): Secondary | ICD-10-CM

## 2024-04-05 DIAGNOSIS — R293 Abnormal posture: Secondary | ICD-10-CM

## 2024-04-05 DIAGNOSIS — R2689 Other abnormalities of gait and mobility: Secondary | ICD-10-CM

## 2024-04-05 DIAGNOSIS — M79605 Pain in left leg: Secondary | ICD-10-CM

## 2024-04-05 DIAGNOSIS — R2681 Unsteadiness on feet: Secondary | ICD-10-CM | POA: Diagnosis not present

## 2024-04-10 ENCOUNTER — Ambulatory Visit: Admitting: Physical Therapy

## 2024-04-10 ENCOUNTER — Encounter: Payer: Self-pay | Admitting: Physical Therapy

## 2024-04-10 DIAGNOSIS — R2689 Other abnormalities of gait and mobility: Secondary | ICD-10-CM | POA: Diagnosis not present

## 2024-04-10 DIAGNOSIS — R2681 Unsteadiness on feet: Secondary | ICD-10-CM

## 2024-04-10 DIAGNOSIS — M6281 Muscle weakness (generalized): Secondary | ICD-10-CM | POA: Diagnosis not present

## 2024-04-10 DIAGNOSIS — R293 Abnormal posture: Secondary | ICD-10-CM

## 2024-04-10 DIAGNOSIS — M25652 Stiffness of left hip, not elsewhere classified: Secondary | ICD-10-CM

## 2024-04-10 NOTE — Therapy (Addendum)
 OUTPATIENT PHYSICAL THERAPY PROSTHETIC TREATMENT   Patient Name: Mike Floyd MRN: [id number] DOB:1960-12-08, 63 y.o., male Today's Date: 04/10/2024  END OF SESSION:  PT End of Session - 04/10/24 1433     Visit Number 8    Number of Visits 25    Date for PT Re-Evaluation 05/31/24    Authorization Type [employer] MEDICARE    Authorization Time Period $45 copay    Progress Note Due on Visit 10    PT Start Time 1433    PT Stop Time 1515    PT Time Calculation (min) 42 min    Equipment Utilized During Treatment Gait belt    Activity Tolerance Patient tolerated treatment well    Behavior During Therapy WFL for tasks assessed/performed           Past Medical History:  Diagnosis Date   AICD (automatic cardioverter/defibrillator) present 11/2007   Removed in 2018, a. 11/2007 S[facility] Current VR - single lead ICD  - Removed 2018 - it was burning me   Anxiety    CAD (coronary artery disease)    non-obstructive CAD by Cor CT in 2020 // Myoview  3/22: EF 57, small inf-sept defect c/w scar, no ischemia; low risk      Chest pain    a. 10/2007 Cath:  normal Cors.   CKD (chronic kidney disease), stage II    DDD (degenerative disc disease), lumbar    Diabetes mellitus DX: 2010   type 2   Erosive esophagitis    a. per EGD (08/2011), Dr. Golda - Erosive reflux esophagitis improved but not completely healed since previous EGD 3 years ago. Bx showing  ulcerated gatroesophageal junction mucosa. negative for H. pylori   GERD (gastroesophageal reflux disease)    Gout    Hearing deficit    a. wear bilateral hearing aides   History of hiatal hernia    History of kidney stones    passed stones, no surgery   Hypertension    Mildly dilatd aortic root (HCC)    CMR 4/22: EF 52, no LGE; d/w Dr. Ione root 38 mm (mildly dilated)   Myocardial infarction [facility]) 2011   Neuropathy    Feet and legs   Nonischemic dilated cardiomyopathy (HCC)    a. H/O EF as low as 35-40% by LV gram 10/2007;  b. Echo 02/2011  EF 50-55%, inf HK, Gr 1 DD // CMR 4/22: EF 52, no LGE; d/w Dr. Ione root 38 mm (mildly dilated)    Renal insufficiency    Sleep apnea    pt doesnt use, statesI cant afford one. PCP aware   Stroke (HCC)    mini-stroke in 2014   TIA (transient ischemic attack)    July, 2013   Tobacco abuse, in remission 06/27/2009   Discontinued in 2009     Wears dentures    top plate   WPW (Wolff-Parkinson-White syndrome)    a. s/p RFCA @ [facility] - 1999   Past Surgical History:  Procedure Laterality Date   AMPUTATION Left 05/19/2022   Procedure: TRANSMETATARSAL AMPUTATION LEFT FOOT;  Surgeon: Harden Jerona GAILS, MD;  Location: [facility] OR;  Service: Orthopedics;  Laterality: Left;   AMPUTATION Left 06/17/2023   Procedure: LEFT BELOW KNEE AMPUTATION;  Surgeon: Harden Jerona GAILS, MD;  Location: [facility] OR;  Service: Orthopedics;  Laterality: Left;   AMPUTATION Left 08/19/2023   Procedure: LEFT ABOVE KNEE AMPUTATION;  Surgeon: Harden Jerona GAILS, MD;  Location: [facility] OR;  Service: Orthopedics;  Laterality: Left;  APPENDECTOMY     BACK SURGERY     1995   CARDIAC CATHETERIZATION     2009   CARDIAC DEFIBRILLATOR PLACEMENT     ICD was removed in 2018   CHOLECYSTECTOMY     CHOLECYSTECTOMY, LAPAROSCOPIC     11/2007   COLONOSCOPY W/ POLYPECTOMY  2009   ELBOW SURGERY Left 06/2010   ESOPHAGEAL DILATION N/A 11/08/2014   Procedure: ESOPHAGEAL DILATION;  Surgeon: Claudis RAYMOND Rivet, MD;  Location: [facility] ORS;  Service: Endoscopy;  Laterality: N/A;  #56,    ESOPHAGOGASTRODUODENOSCOPY  03/31/2012   also 08/2011; [name]   ESOPHAGOGASTRODUODENOSCOPY (EGD) WITH PROPOFOL  N/A 11/08/2014   Procedure: ESOPHAGOGASTRODUODENOSCOPY (EGD) WITH PROPOFOL ;  Surgeon: Claudis RAYMOND Rivet, MD;  Location: [facility] ORS;  Service: Endoscopy;  Laterality: N/A;  Hiatus is 31 , GE Junction is 37   FOOT ARTHRODESIS Left 10/24/2020   Procedure: LEFT GASTROCNEMIUS RECESSION, DORSIFLEXION OSTEOTOMY 1ST MT;  Surgeon: Harden Jerona GAILS, MD;  Location: [facility] OR;  Service:  Orthopedics;  Laterality: Left;   FOOT ARTHRODESIS Right 01/09/2021   Procedure: CLOSING WEDGE OSTEOTOMY RIGHT 1ST METATARSAL;  Surgeon: Harden Jerona GAILS, MD;  Location: [facility] OR;  Service: Orthopedics;  Laterality: Right;   GASTROCNEMIUS RECESSION Right 01/09/2021   Procedure: RIGHT GASTROCNEMIUS RECESSION;  Surgeon: Harden Jerona GAILS, MD;  Location: [facility] OR;  Service: Orthopedics;  Laterality: Right;   ICD LEAD REMOVAL N/A 10/07/2016   Procedure: ICD LEAD REMOVAL ;  Surgeon: Danelle LELON Birmingham, MD;  Location: [facility] OR;  Service: Cardiovascular;  Laterality: N/A;   MULTIPLE TOOTH EXTRACTIONS     RADIOFREQUENCY ABLATION  1999   WPW; performed at [facility]   STUMP REVISION Left 07/22/2023   Procedure: REVISION LEFT BELOW KNEE AMPUTATION;  Surgeon: Harden Jerona GAILS, MD;  Location: [facility] OR;  Service: Orthopedics;  Laterality: Left;   TEE WITHOUT CARDIOVERSION N/A 10/07/2016   Procedure: TRANSESOPHAGEAL ECHOCARDIOGRAM (TEE);  Surgeon: Danelle LELON Birmingham, MD;  Location: [facility] OR;  Service: Cardiovascular;  Laterality: N/A;   Patient Active Problem List   Diagnosis Date Noted   Dyslipidemia 11/03/2023   Essential hypertension 11/03/2023   Type 2 diabetes mellitus with peripheral neuropathy (HCC) 11/03/2023   BPH (benign prostatic hyperplasia) 11/03/2023   GERD without esophagitis 11/03/2023   S/P AKA (above knee amputation) unilateral, left (HCC) 08/19/2023   Dehiscence of amputation stump of left lower extremity (HCC) 07/22/2023   Ischemia of site of left below knee amputation (HCC) 07/22/2023   S/P BKA (below knee amputation) unilateral, left (HCC) 07/22/2023   Below-knee amputation of left lower extremity (HCC) 06/17/2023   Acute osteomyelitis of metatarsal bone of left foot (HCC)    Subacute osteomyelitis, left ankle and foot (HCC) 05/13/2022   Non-pressure chronic ulcer of other part of right foot limited to breakdown of skin (HCC)    Contracture of right Achilles tendon    Mildly dilatd aortic root (HCC)     Contracture of left Achilles tendon    Metatarsal deformity, left    CAD (coronary artery disease)    Hallux hammertoe, left 12/11/2019   Diarrhea 06/27/2019   Diastasis recti 08/22/2018   Umbilical hernia without obstruction and without gangrene 08/22/2018   Vitamin D  deficiency 06/23/2017   Essential hypertension, benign 06/15/2017   Mixed hyperlipidemia 06/15/2017   Class 1 obesity due to excess calories with serious comorbidity and body mass index (BMI) of 31.0 to 31.9 in adult 06/15/2017   Malfunction of implantable cardioverter-defibrillator (ICD) electrode 10/07/2016   Chronic kidney  disease (CKD), stage III (moderate) (HCC) 04/04/2015   Bladder neck obstruction 03/21/2015   Difficult or painful urination 03/21/2015   Flank pain 03/21/2015   Delayed onset of urination 03/21/2015   Calculus of kidney 03/21/2015   Erosive esophagitis 12/27/2013   Hypotension due to drugs 07/02/2013   Chronic systolic heart failure (HCC) 04/24/2013   CAP (community acquired pneumonia) 03/16/2013   HTN (hypertension) 03/15/2013   Hydrocele 03/15/2013   Spermatocele 03/15/2013   DOE (dyspnea on exertion) 03/15/2013   OSA (obstructive sleep apnea) 01/09/2013   Chronic kidney disease, stage 3 (HCC) 01/03/2013   Chest pain 12/26/2012   History of diagnostic tests 09/06/2012   TIA (transient ischemic attack)    Cardiomyopathy, nonischemic (HCC) 02/24/2010   AICD (automatic cardioverter/defibrillator) 02/24/2010   DM type 2 causing vascular disease (HCC) 06/27/2009   Gout 06/27/2009   Tobacco abuse, in remission 06/27/2009   COLONIC POLYPS 10/31/2008   GERD (gastroesophageal reflux disease) 10/31/2008    PCP: Bertell Satterfield, MD  REFERRING PROVIDER: Harden Jerona GAILS, MD  REFERRING DIAG: [phone] (ICD-10-CM) - Hx of AKA (above knee amputation), left (HCC)   THERAPY DIAG:  Other abnormalities of gait and mobility  Muscle weakness (generalized)  Stiffness of left hip, not elsewhere  classified  Abnormal posture  Unsteadiness on feet  Rationale for Evaluation and Treatment: Rehabilitation  ONSET DATE: Prosthesis delivery 02/20/2024  SUBJECTIVE:   SUBJECTIVE STATEMENT: Patient overall doing well with minimal pain.  Household ambulation near support like table or counter with cane for roughly 15 to 20 minutes.  Patient is consistent with HEP  PERTINENT HISTORY: DM2, GERD, hypertension, dyslipidemia, CAD, L BKA (06/17/23), neuropathy  Eval: Patient is a 63 year old male who presents to physical therapy with a left AKA (08/19/2023) with a new prosthesis delivery on 02/20/2024. Patient had previous home health PT prior to Lt AKA. He is able to mobilize with modified independence w/ walker. Wears prosthesis about ten hours a day and by the end of the night he takes it off due to fatigue and pain.   PAIN:  Are you having pain? Yes: NPRS scale: 2/10 Pain location: Femur in prosthesis and pain in the Rt leg as well Pain description: feels like its been hammered Aggravating factors: keeping prosthesis on for long periods of times, sitting Relieving factors: taking off prosthesis and medications  PRECAUTIONS: Fall  RED FLAGS: None   WEIGHT BEARING RESTRICTIONS: No  FALLS:  Has patient fallen in last 6 months? Yes. Number of falls 2, forgetting he doesn't have a leg and put weight onto the left side. He did not hit the floor, only fell into another person. Occurred when transferring from sitting to standing  LIVING ENVIRONMENT: Lives with: lives with their spouse Lives in: Other Trailer on grass Stairs: Yes: External: 4 steps; can reach both Has following equipment at home: Single point cane, Environmental consultant - 2 wheeled, Wheelchair (manual), and shower chair  OCCUPATION: retired   PLOF: Independent with household mobility with device  PATIENT GOALS: ambulating and ADLs with cane   NEXT MD VISIT: Office MD visit: 03/21/2024   OBJECTIVE:  Patient-Specific Activity  Scoring Scheme  0 represents "unable to perform." 10 represents "able to perform at prior level. 0 1 2 3 4 5 6 7 8 9  10 (Date and Score)   Activity Eval     1. Walking w/ cane  2    2. Standing for ADLs  5    3. Stairs 5   4.  5.    Score 4    Total score = sum of the activity scores/number of activities Minimum detectable change (90%CI) for average score = 2 points Minimum detectable change (90%CI) for single activity score = 3 points  DIAGNOSTIC FINDINGS: none post-amputation  COGNITION: Overall cognitive status: Within functional limits for tasks assessed   SENSATION: WFL  MUSCLE LENGTH:   POSTURE: rounded shoulders, forward head, flexed trunk , and weight shift right  LOWER EXTREMITY ROM:  ROM P:passive  A:active Left eval  Hip flexion   Hip extension Thomas position: P: - 40*  Hip abduction   Hip adduction   Hip internal rotation   Hip external rotation   Knee flexion   Knee extension   Ankle dorsiflexion   Ankle plantarflexion   Ankle inversion   Ankle eversion    (Blank rows = not tested)  LOWER EXTREMITY MMT:  MMT Right eval Left eval  Hip flexion 3/5 3/5  Hip extension 3/5 3-/5  Hip abduction  3-/5  Hip adduction    Hip internal rotation    Hip external rotation    Knee flexion    Knee extension 3/5   Ankle dorsiflexion    Ankle plantarflexion    Ankle inversion    Ankle eversion    At Evaluation all strength testing is grossly seated and functionally standing / gait. (Blank rows = not tested)  BED MOBILITY:  Sit to supine Complete Independence Supine to sit Complete Independence Rolling to Right Complete Independence  TRANSFERS: 04/10/2024: Pt able stand to/from sit into chair without armrest using BUEs on seat and RW for stabilization with supervision/verbal cues.  Eval: Sit to stand: supervision requires a stable object to arise, stabilization w/ walker using UE support, with delayed weight shift onto the prosthesis for  stabilization, and uses UE to bring Lt prosthesis back below his body  Stand to sit: supervision requires a stable object to arise, stabilization w/ walker using UE support, with delayed weight shift onto the prosthesis for stabilization  FUNCTIONAL TESTs:  04/10/2024: Patient able to reach 10 forward and look over his shoulders with single upper extremity support on RW.   Eval:  BERG score 11/56  GAIT: 04/10/2024: Patient ambulated 125' with RW and supervision.   Patient negotiated 12*ramp and 6.5 curb with RW and supervision  Eval: Gait pattern: step to pattern, decreased step length- Right, decreased stance time- Left, decreased hip/knee flexion- Left, circumduction- Left, Left hip hike, antalgic, trendelenburg, lateral lean- Right, trunk flexed, abducted- Left, and poor foot clearance- Left Distance walked: 62 ft Assistive device utilized: Walker - 2 wheeled and AKA prosthesis Level of assistance: SBA / steady with RW support but needs cues  Gait velocity: 0.72 ft/sec Comments: excessive weightbearing on BUEs using RW  RAMP : not tested  CURB: not tested  CURRENT PROSTHETIC WEAR ASSESSMENT:   Patient is dependent with: skin check, residual limb care, care of non-amputated limb, prosthetic cleaning, ply sock cleaning, correct ply sock adjustment, proper wear schedule/adjustment, and proper weight-bearing schedule/adjustment Donning prosthesis: SBA / verbal cues Doffing prosthesis: Complete Independence Prosthetic wear tolerance: 6-8 hours, 1x/day, 7 days/week Prosthetic weight bearing tolerance: tolerated standing for 10 minutes with partial weight on prosthesis during BERG balance test with no increased pain. Although he reports standing pain with activities at home. Edema: minimal Residual limb condition: 3x57mm superficial wound distally located, scar with mild adherence, no adductor roll, normal skin color, decreased hair growth present Prosthetic description: silicone liner  with suction  ring suspension, ischial containment socket with flexible inner socket, rotator unit, SAFETY (single axis friction engaging with an extension assist), flexible keel foot  TODAY'S TREATMENT:                                                                                                                             DATE: 04/10/2024 Prosthetic Training with Transfemoral Prosthesis  PT instructed patient to wear prosthesis 4-5 hours/2x a day with a 2 hour break with goal to build to wear most of his awake hours.  PT instructed patient that increasing wear increases his ability to improve overall function throughout his day.  Taking the mid break typically increases wear time without increasing limb pain or skin issues.  Patient and wife verbalized understanding. PT verbally educated patient about the use of ply socks.  Patient reports she did not receive any socks when he received his prosthesis.  PT recommended asking prosthetist at his next visit.  Patient verbalized understanding PT cued patient on proper foot position to facilitate prosthetic knee stability/extension in stance for standing ADLs.  Patient able to reach 10 forward and look over his shoulders with single upper extremity support on RW.  Patient ambulated 125' with RW and supervision.  Performed the stand to sit into chair without armrest using BUEs on seat with supervision/verbal cues. Patient negotiated 12*ramp and 6.5 curb with RW and supervision PT verbally educated and demo how to negotiate stairs with step-to pattern, cane, and using single rail.  Patient able to negotiate 3 steps with right rail/LUE cane and 3 steps with left rail/RUE cane with min/CGA.  PT also instructed in moving walker prior to performing stairs when going out of the community with family.  Patient demoed and verbalized understanding. Patient is unsteady during activity.    TREATMENT:                                                                                                                              DATE: 04/05/2024 Prosthetic Training with Transfemoral Prosthesis  Patient ambulating with SPC with quad tip and CGA from PT for 68.3' to focus on appropriate weight shift and Rt swing through. Patient began ambulating with downward gaze, decreased Rt step length and increase Lt abduction. With PT verbal cues given intermittently throughout, patient began to have appropriate carryover without need for increased verbal cues.  Patient ambulating with SPC with quad tip and  CGA from PT for 58.5' to leg press. Patient performed with improved performance when compared to above. Continued to require intermittent cues for upright posture and weight shift, though less than above. Patient with intermittent small shuffling steps secondary to minimal LOB; able to self correct with no need for increased physical assist.  Patient ambulating 26.7' with very close CGA and SPC with quad tip after performing leg press. Patient with increased fatigue in Rt LE and unsteady on feet, but able to complete with no need for increased assist.  Patient performed square balance on floor with SPC with quad tip and very close CGA. Patient performed with uneven step lengths, but performed weight shift onto Lt LE for Rt side step with ease. Fwd/back more difficult. Patient performing square one time in each direction before requiring rest break secondary to fear.  Patient performing fwd/back ambulation with SPC with quad tip and CGA with counter on Lt. Patient with more confidence and equal step lengths with Rt and Lt LEs. Though patient performed with equal step lengths, the step lengths were shorter than typical.  No lateral, fwd, or back LOB with no need for increased assist or UE use on counter throughout activity. Patient completed 3 fwd and 3 back before returning to seated position.  Patient performing several sit<>stands from different surfaces [facility], leg press machine, and 19 arm  chair) throughout session using SPC with quad tip and SBA>CGA from PT. Patient performing with appropriate form at beginning of session, but as fatigue set in , form began to break down, though did not require increased assist.   TherEx:  Bilat LE leg press 2x8 with 56#. Attempted 75#, but had form breakdown with Rt LE and difficulty secondary to heaviness. PT giving verbal cues to decrease Rt LE hyperextension moment with appropriate carryover from patient.  TREATMENT:                                                                                                                             DATE: 04/02/2024 Therapeutic Activities: PT explained to patient that AKA surgery alters adductor muscles but not abductor muscles creating an imbalance.  If abductors over power the adductors then his residual limb is pulled outward against socket creating pain where he is reporting it.  PT instructed pt in the following exercises to facilitate improved muscle balance which would keep limb more central in socket.  Pt did report less following the exercises.  Adduction ball squeezes in sitting, focusing on Lt LE primarily adducting and squeezing 10 reps Adduction ball squeezes in standing, focusing on Lt LE primarily adducting and squeezing 10 reps PT recommended doing these exercises 2-3 times per day.  Pt verbalized understanding.   Prosthetic Training with Transfemoral Prosthesis Prosthetic gait training focusing on decreasing abduction of prosthetic limb.  PT using visual cues, then visual with proprioception followed by only proprioception. Patient ambulated 74' with cane and minA.  Using a line on floor to  the erect position of the foot so the base of support was within 4 inches.  PT demo and verbal cues on not allowing some limb heel to roll into the line of progression and keeping medial prosthetic heel within 2 inches of the line. Patient ambulated 59' with cane and minA.  Patient added to the above cueing  for patient to take 4 steps on the prosthetic limb with the prosthesis in the correct position and to with the prosthetic limb abducted outside the proper position.  Patient repeated this cycle to increase proprioception of when the foot was in the proper position. Patient ambulated 19' x 2 with cane and minA.  For the initial 25' PT had the patient repeat the process of 4 steps with the prosthetic limb in the proper step width and 2 steps abducted for proprioception without the visual cue of the line.  Patient reported being able to perceive when his foot was in the proper position following above instruction and less distal lateral pain.    TREATMENT:                                                                                                                             DATE: 03/29/2024 Prosthetic Care with AKA prosthesis  Multiple sit <> stands throughout session from arm chair and MWC with SBA; verbalizing steps for appropriate stand to sit  Weight shifts in parallel bars with SBA using bilat UE ; verbal cues for forward gaze in order to improve posture  Pre gait performed in parallel bars with Rt UE use for stability and CGA from PT. Patient performing 10 step throughs with Rt LE with improved performance with time and repetition. Intermittent cues for forward gaze given throughout with moderate carryover.  Ambulation in parallel bars with CGA and with PT assist at superior-lateral Rt glute for weight shift onto Lt LE ~85% of the time. 2 sets of 2 full lengths of the parallel bars with patient performing appropriate and safe turn with UE assist and CGA turning toward Lt LE.  Patient able to improve toward no assist with weight shift with time and repetition.  Ambulation with SPC with quad tip and CGA-min A for 50'8 with intermittent carryover from ambulation practice in parallel bars (increased assist to min A secondary to minimal lateral LOB). Patient performing with large Lt step leading to  inappropriate weight shift onto Lt LE; PT giving verbal cues for smaller step. Patient performed with appropriate step length for about 5' and then went to small steps bilat, almost a shuffling pattern.  Lateral stepping at counter with bilat UE support and CGA with verbal cues for appropriate form, to include upright posture and pointing toes and hips forward for engagement of abductors. Patient performing 2x 2 full lengths of counter in both directions with seated rest break in between sets. PT educating patient on why turning hip out is easier that true sidestepping due to abduction weakness and  hip flexor strength.   Patient noting popping sensation on lateral inferior residual limb. Patient doffed prosthesis and shrinker in order to assess skin as well as appropriate placement of shrinker/prosthesis. Patient highly painful to palpation over inferior lateral femur, but no new lesions visualized. Patient donned shrinker and prosthesis with no deficiencies. Attempted to ambulate again, though patient took 3 steps endorsing continued pain and discomfort with popping sensation. Ambulation discontinued.  Transfer into vehicle with SBA-CGA from PT with patient performing ambulation using truck as stability point. No physical assistance required for safe transfer.     PATIENT EDUCATION: PATIENT EDUCATED ON FOLLOWING PROSTHETIC CARE: Education details: Sitting positioning to limit skin traction from weight of prosthesis, how hip flexion contracture effects residual limb pain & posture,  Prosthetic cleaning and Propper donning  Person educated: Patient, Spouse, and close family friend Education method: Explanation, Demonstration, Tactile cues, and Verbal cues Education comprehension: verbalized understanding and needs further education  HOME EXERCISE PROGRAM: Access Code: [email or web address] URL: [email or web address] Date: 03/20/2024 Prepared by: Grayce Spatz  Exercises - Modified Thomas  Stretch  - 2 x daily - 7 x weekly - 1 sets - 2-3 reps - 30 seconds hold - Hip Flexor Stretch at Edge of Bed  - 2 x daily - 7 x weekly - 1 sets - 2-3 reps - 30 seconds hold - Standing posture with back to counter  - 2 x daily - 7 x weekly - 1 sets - 1 reps - 5-10 minutes hold - Upright Stance at Door Frame Single Arm  - 3-6 x daily - 7 x weekly - 1 sets - 2 reps - 2 deep breathes hold - Upright Stance at Door Frame with Both Arms  - 3-6 x daily - 7 x weekly - 1 sets - 2 reps - 2 deep breathes hold  Do each exercise 1-2  times per day Do each exercise 5-10 repetitions Hold each exercise for 2 seconds to feel your location  AT SINK FIND YOUR MIDLINE POSITION AND PLACE FEET EQUAL DISTANCE FROM THE MIDLINE.  Try to find this position when standing still for activities.   USE TAPE ON FLOOR TO MARK THE MIDLINE POSITION which is even with middle of sink.  You also should try to feel with your limb pressure in socket.  You are trying to feel with limb what you used to feel with the bottom of your foot.  Side to Side Shift: Moving your hips only (not shoulders): move weight onto your left leg, HOLD/FEEL pressure in socket.  Move back to equal weight on each leg, HOLD/FEEL pressure in socket. Move weight onto your right leg, HOLD/FEEL pressure in socket. Move back to equal weight on each leg, HOLD/FEEL pressure in socket. Repeat.  Start with both hands on sink, progress to hand on prosthetic side only, then no hands.  Front to Back Shift: Moving your hips only (not shoulders): move your weight forward onto your toes, HOLD/FEEL pressure in socket. Move your weight back to equal Flat Foot on both legs, HOLD/FEEL  pressure in socket. Move your weight back onto your heels, HOLD/FEEL  pressure in socket. Move your weight back to equal on both legs, HOLD/FEEL  pressure in socket. Repeat.  Start with both hands on sink, progress to hand on prosthetic side only, then no hands.  Moving Cones / Cups: With equal weight  on each leg: Hold on with one hand the first time, then progress to no hand supports. Move cups from one side  of sink to the other. Place cups ~2" out of your reach, progress to 10" beyond reach.  Place one hand in middle of sink and reach with other hand. Do both arms.  Then hover one hand and move cups with other hand.  Overhead/Upward Reaching: alternated reaching up to top cabinets or ceiling if no cabinets present. Keep equal weight on each leg. Start with one hand support on counter while other hand reaches and progress to no hand support with reaching.  ace one hand in middle of sink and reach with other hand. Do both arms.  Then hover one hand and move cups with other hand.  5.   Looking Over Shoulders: With equal weight on each leg: alternate turning to look over your shoulders with one hand support on counter as needed.  Start with head motions only to look in front of shoulder, then even with shoulder and progress to looking behind you. To look to side, move head /eyes, then shoulder on side looking pulls back, shift more weight to side looking and pull hip back. Place one hand in middle of sink and let go with other hand so your shoulder can pull back. Switch hands to look other way.   Then hover one hand and look over shoulder. If looking right, use left hand at sink. If looking left, use right hand at sink. 6.  Stepping with leg that is not amputated:  Move items under cabinet out of your way. Shift your hips/pelvis so weight on prosthesis. Tighten muscles in hip on prosthetic side.  SLOWLY step other leg so front of foot is in cabinet. Then step back to floor.    ASSESSMENT:  CLINICAL IMPRESSION: Patient overall did well today. Patient met all short term goals except for prosthesis wear hours. Patient was advised how to meet that goal and seemed to understanding the importance of total wear hours. Patient's pain symptoms have minimized due to adduction exercises. Patient is independent with RW  within house hold and community ambulation and will continue benefit from gait training to progress to a cane. Patient will continue to benefit from skilled PT to address impairments and gain independence.  OBJECTIVE IMPAIRMENTS: Abnormal gait, decreased activity tolerance, decreased balance, decreased endurance, decreased knowledge of condition, decreased knowledge of use of DME, decreased mobility, difficulty walking, decreased ROM, decreased strength, impaired flexibility, postural dysfunction, prosthetic dependency , and pain.   ACTIVITY LIMITATIONS: carrying, lifting, bending, sitting, standing, squatting, stairs, transfers, bathing, toileting, dressing, reach over head, and locomotion level  PARTICIPATION LIMITATIONS: meal prep, cleaning, laundry, driving, shopping, community activity, and yard work  PERSONAL FACTORS: Age, Fitness, Past/current experiences, Time since onset of injury/illness/exacerbation, and 3+ comorbidities: see PMH are also affecting patient's functional outcome.   REHAB POTENTIAL: Good  CLINICAL DECISION MAKING: Evolving/moderate complexity  EVALUATION COMPLEXITY: Moderate   GOALS: Goals reviewed with patient? Yes  SHORT TERM GOALS: Target date: 04/05/2024  Patient donnes prosthesis modified independent & verbalizes proper cleaning. Baseline: SEE OBJECTIVE DATA Goal status: MET, 04/10/2024  2.  Patient tolerates prosthesis >12 hrs total /day without skin issues or limb pain >7/10 after standing. Baseline: patient reports wearing 4-5 hr/day Goal status: NOT MET, 04/10/2024  3.  Patient able to reach 7 and look over both shoulders with RW support with Modified independent. Baseline: SEE OBJECTIVE DATA Goal status: MET, 04/10/2024  4. Patient ambulates 125' with RW & prosthesis with supervision. Baseline: SEE OBJECTIVE DATA Goal status: MET, 04/10/2024  5. Patient negotiates ramps &  curbs with RW & prosthesis with supervision. Baseline: SEE OBJECTIVE DATA Goal  status: MET, 04/10/2024  LONG TERM GOALS: Target date: 05/31/2024  Patient demonstrates & verbalized understanding of prosthetic care to enable safe utilization of prosthesis. Baseline: SEE OBJECTIVE DATA Goal status: Ongoing   03/20/2024  Patient tolerates prosthesis wear >90% of awake hours without skin issues or limb pain >4/10. Baseline: SEE OBJECTIVE DATA Goal status: Ongoing   03/20/2024  BERG >/= 25/56 to indicate lower fall risk Baseline: SEE OBJECTIVE DATA Goal status: Ongoing   03/20/2024  Patient ambulates >300' with prosthesis and LRAD independently Baseline: SEE OBJECTIVE DATA Goal status: Ongoing   03/20/2024  Patient negotiates ramps, curbs & stairs with single rail with prosthesis and LRAD independently. Baseline: SEE OBJECTIVE DATA Goal status: Ongoing   03/20/2024  6.  Patient reports Patient-Specific Activity Score improved the average by 3 to indicate improvement in functional activities.   Baseline: SEE OBJECTIVE DATA Goal status: Ongoing   03/20/2024  PLAN:  PT FREQUENCY: 2x/week  PT DURATION: 12 weeks  PLANNED INTERVENTIONS: 97164- PT Re-evaluation, 97750- Physical Performance Testing, 97110-Therapeutic exercises, 97530- Therapeutic activity, 97112- Neuromuscular re-education, 97535- Self Care, 02859- Manual therapy, [phone]- Gait training, [phone]- Prosthetic Initial , [phone]- Orthotic/Prosthetic subsequent, Patient/Family education, Balance training, Stair training, Joint mobilization, and DME instructions  PLAN FOR NEXT SESSION:  set updated STGs target 10/2,  standing balance activities, stairs, 4 square step test, static/dynamic balance with scanning   Ismael Theophilus Stallion, SPT 04/10/24 3:42 PM   This entire session of physical therapy was performed under the direct supervision of PT signing evaluation /treatment. PT reviewed note and agrees.  Grayce Spatz, PT, DPT 04/10/2024, 5:54 PM    Date of referral: 02/13/2024 Referring provider: Harden Jerona GAILS, MD Referring diagnosis? S10.387 (ICD-10-CM) - Hx of AKA (above knee amputation), left (HCC)  Treatment diagnosis? (if different than referring diagnosis) R26.89, M62.81, M79.605, M25.652, R29.3, R26.81, L98.498  What was this (referring dx) caused by? Other: Lt AKA  Nature of Condition: Initial Onset (within last 3 months)   Laterality: Lt  Current Functional Measure Score: Patient Specific Functional Scale Score:4  Objective measurements identify impairments when they are compared to normal values, the uninvolved extremity, and prior level of function.  [x]  Yes  []  No  Objective assessment of functional ability: Moderate functional limitations   Briefly describe symptoms: Patient is dependent in prosthetic care and use with high risk of falls. Gait deviation, muscle weakness, pain in Lt residual limb, hip flexion contracture.  How did symptoms start: amputation due to vascular issues  Average pain intensity:  Last 24 hours: 8-9/10  Past week: 8-9/10  How often does the pt experience symptoms? Constantly  How much have the symptoms interfered with usual daily activities? Quite a bit  How has condition changed since care began at this facility? NA - initial visit  In general, how is the patients overall health? Good   BACK PAIN (STarT Back Screening Tool) No

## 2024-04-11 NOTE — Therapy (Signed)
 OUTPATIENT PHYSICAL THERAPY PROSTHETIC TREATMENT   Patient Name: Mike Floyd MRN: [id number] DOB:1961-06-21, 63 y.o., male Today's Date: 04/12/2024  END OF SESSION:  PT End of Session - 04/12/24 1429     Visit Number 9    Number of Visits 25    Date for PT Re-Evaluation 05/31/24    Authorization Type [employer] MEDICARE    Authorization Time Period $45 copay    Progress Note Due on Visit 10    PT Start Time 1432    PT Stop Time 1514    PT Time Calculation (min) 42 min    Equipment Utilized During Treatment Gait belt    Activity Tolerance Patient tolerated treatment well    Behavior During Therapy WFL for tasks assessed/performed            Past Medical History:  Diagnosis Date   AICD (automatic cardioverter/defibrillator) present 11/2007   Removed in 2018, a. 11/2007 S[facility] Current VR - single lead ICD  - Removed 2018 - it was burning me   Anxiety    CAD (coronary artery disease)    non-obstructive CAD by Cor CT in 2020 // Myoview  3/22: EF 57, small inf-sept defect c/w scar, no ischemia; low risk      Chest pain    a. 10/2007 Cath:  normal Cors.   CKD (chronic kidney disease), stage II    DDD (degenerative disc disease), lumbar    Diabetes mellitus DX: 2010   type 2   Erosive esophagitis    a. per EGD (08/2011), Dr. Golda - Erosive reflux esophagitis improved but not completely healed since previous EGD 3 years ago. Bx showing  ulcerated gatroesophageal junction mucosa. negative for H. pylori   GERD (gastroesophageal reflux disease)    Gout    Hearing deficit    a. wear bilateral hearing aides   History of hiatal hernia    History of kidney stones    passed stones, no surgery   Hypertension    Mildly dilatd aortic root (HCC)    CMR 4/22: EF 52, no LGE; d/w Dr. Ione root 38 mm (mildly dilated)   Myocardial infarction [facility]) 2011   Neuropathy    Feet and legs   Nonischemic dilated cardiomyopathy (HCC)    a. H/O EF as low as 35-40% by LV gram 10/2007;  b. Echo  02/2011 EF 50-55%, inf HK, Gr 1 DD // CMR 4/22: EF 52, no LGE; d/w Dr. Ione root 38 mm (mildly dilated)    Renal insufficiency    Sleep apnea    pt doesnt use, statesI cant afford one. PCP aware   Stroke (HCC)    mini-stroke in 2014   TIA (transient ischemic attack)    July, 2013   Tobacco abuse, in remission 06/27/2009   Discontinued in 2009     Wears dentures    top plate   WPW (Wolff-Parkinson-White syndrome)    a. s/p RFCA @ [facility] - 1999   Past Surgical History:  Procedure Laterality Date   AMPUTATION Left 05/19/2022   Procedure: TRANSMETATARSAL AMPUTATION LEFT FOOT;  Surgeon: Harden Jerona GAILS, MD;  Location: [facility] OR;  Service: Orthopedics;  Laterality: Left;   AMPUTATION Left 06/17/2023   Procedure: LEFT BELOW KNEE AMPUTATION;  Surgeon: Harden Jerona GAILS, MD;  Location: [facility] OR;  Service: Orthopedics;  Laterality: Left;   AMPUTATION Left 08/19/2023   Procedure: LEFT ABOVE KNEE AMPUTATION;  Surgeon: Harden Jerona GAILS, MD;  Location: [facility] OR;  Service: Orthopedics;  Laterality: Left;  APPENDECTOMY     BACK SURGERY     1995   CARDIAC CATHETERIZATION     2009   CARDIAC DEFIBRILLATOR PLACEMENT     ICD was removed in 2018   CHOLECYSTECTOMY     CHOLECYSTECTOMY, LAPAROSCOPIC     11/2007   COLONOSCOPY W/ POLYPECTOMY  2009   ELBOW SURGERY Left 06/2010   ESOPHAGEAL DILATION N/A 11/08/2014   Procedure: ESOPHAGEAL DILATION;  Surgeon: Claudis RAYMOND Rivet, MD;  Location: [facility] ORS;  Service: Endoscopy;  Laterality: N/A;  #56,    ESOPHAGOGASTRODUODENOSCOPY  03/31/2012   also 08/2011; [name]   ESOPHAGOGASTRODUODENOSCOPY (EGD) WITH PROPOFOL  N/A 11/08/2014   Procedure: ESOPHAGOGASTRODUODENOSCOPY (EGD) WITH PROPOFOL ;  Surgeon: Claudis RAYMOND Rivet, MD;  Location: [facility] ORS;  Service: Endoscopy;  Laterality: N/A;  Hiatus is 27 , GE [place]unction is 37   FOOT ARTHRODESIS Left 10/24/2020   Procedure: LEFT GASTROCNEMIUS RECESSION, DORSIFLEXION OSTEOTOMY 1ST MT;  Surgeon: Harden Jerona GAILS, MD;  Location: [facility] OR;  Service:  Orthopedics;  Laterality: Left;   FOOT ARTHRODESIS Right 01/09/2021   Procedure: CLOSING WEDGE OSTEOTOMY RIGHT 1ST METATARSAL;  Surgeon: Harden Jerona GAILS, MD;  Location: [facility] OR;  Service: Orthopedics;  Laterality: Right;   GASTROCNEMIUS RECESSION Right 01/09/2021   Procedure: RIGHT GASTROCNEMIUS RECESSION;  Surgeon: Harden Jerona GAILS, MD;  Location: [facility] OR;  Service: Orthopedics;  Laterality: Right;   ICD LEAD REMOVAL N/A 10/07/2016   Procedure: ICD LEAD REMOVAL ;  Surgeon: Danelle LELON Birmingham, MD;  Location: [facility] OR;  Service: Cardiovascular;  Laterality: N/A;   MULTIPLE TOOTH EXTRACTIONS     RADIOFREQUENCY ABLATION  1999   WPW; performed at [facility]   STUMP REVISION Left 07/22/2023   Procedure: REVISION LEFT BELOW KNEE AMPUTATION;  Surgeon: Harden Jerona GAILS, MD;  Location: [facility] OR;  Service: Orthopedics;  Laterality: Left;   TEE WITHOUT CARDIOVERSION N/A 10/07/2016   Procedure: TRANSESOPHAGEAL ECHOCARDIOGRAM (TEE);  Surgeon: Danelle LELON Birmingham, MD;  Location[facility] OR;  Service: Cardiovascular;  Laterality: N/A;   Patient Active Problem List   Diagnosis Date Noted   Dyslipidemia 11/03/2023   Essential hypertension 11/03/2023   Type 2 diabetes mellitus with peripheral neuropathy (HCC) 11/03/2023   BPH (benign prostatic hyperplasia) 11/03/2023   GERD without esophagitis 11/03/2023   S/P AKA (above knee amputation) unilateral, left (HCC) 08/19/2023   Dehiscence of amputation stump of left lower extremity (HCC) 07/22/2023   Ischemia of site of left below knee amputation (HCC) 07/22/2023   S/P BKA (below knee amputation) unilateral, left (HCC) 07/22/2023   Below-knee amputation of left lower extremity (HCC) 06/17/2023   Acute osteomyelitis of metatarsal bone of left foot (HCC)    Subacute osteomyelitis, left ankle and foot (HCC) 05/13/2022   Non-pressure chronic ulcer of other part of right foot limited to breakdown of skin (HCC)    Contracture of right Achilles tendon    Mildly dilatd aortic root (HCC)     Contracture of left Achilles tendon    Metatarsal deformity, left    CAD (coronary artery disease)    Hallux hammertoe, left 12/11/2019   Diarrhea 06/27/2019   Diastasis recti 08/22/2018   Umbilical hernia without obstruction and without gangrene 08/22/2018   Vitamin D  deficiency 06/23/2017   Essential hypertension, benign 06/15/2017   Mixed hyperlipidemia 06/15/2017   Class 1 obesity due to excess calories with serious comorbidity and body mass index (BMI) of 31.0 to 31.9 in adult 06/15/2017   Malfunction of implantable cardioverter-defibrillator (ICD) electrode 10/07/2016   Chronic kidney  disease (CKD), stage III (moderate) (HCC) 04/04/2015   Bladder neck obstruction 03/21/2015   Difficult or painful urination 03/21/2015   Flank pain 03/21/2015   Delayed onset of urination 03/21/2015   Calculus of kidney 03/21/2015   Erosive esophagitis 12/27/2013   Hypotension due to drugs 07/02/2013   Chronic systolic heart failure (HCC) 04/24/2013   CAP (community acquired pneumonia) 03/16/2013   HTN (hypertension) 03/15/2013   Hydrocele 03/15/2013   Spermatocele 03/15/2013   DOE (dyspnea on exertion) 03/15/2013   OSA (obstructive sleep apnea) 01/09/2013   Chronic kidney disease, stage 3 (HCC) 01/03/2013   Chest pain 12/26/2012   History of diagnostic tests 09/06/2012   TIA (transient ischemic attack)    Cardiomyopathy, nonischemic (HCC) 02/24/2010   AICD (automatic cardioverter/defibrillator) 02/24/2010   DM type 2 causing vascular disease (HCC) 06/27/2009   Gout 06/27/2009   Tobacco abuse, in remission 06/27/2009   COLONIC POLYPS 10/31/2008   GERD (gastroesophageal reflux disease) 10/31/2008    PCP: Bertell Satterfield, MD  REFERRING PROVIDER: Harden Jerona GAILS, MD  REFERRING DIAG: [phone] (ICD-10-CM) - Hx of AKA (above knee amputation), left (HCC)   THERAPY DIAG:  Other abnormalities of gait and mobility  Muscle weakness (generalized)  Stiffness of left hip, not elsewhere  classified  Abnormal posture  Unsteadiness on feet  Rationale for Evaluation and Treatment: Rehabilitation  ONSET DATE: Prosthesis delivery 02/20/2024  SUBJECTIVE:   SUBJECTIVE STATEMENT:  Patient endorsing difficulty at home secondary to wife being sick, but has no complaints about HEP, prosthesis, or minimal pain in residual limb.  PERTINENT HISTORY: DM2, GERD, hypertension, dyslipidemia, CAD, L BKA (06/17/23), neuropathy  Eval: Patient is a 63 year old male who presents to physical therapy with a left AKA (08/19/2023) with a new prosthesis delivery on 02/20/2024. Patient had previous home health PT prior to Lt AKA. He is able to mobilize with modified independence w/ walker. Wears prosthesis about ten hours a day and by the end of the night he takes it off due to fatigue and pain.   PAIN:  Are you having pain? Yes: NPRS scale: 2/10 Pain location: Femur in prosthesis and pain in the Rt leg as well Pain description: feels like its been hammered Aggravating factors: keeping prosthesis on for long periods of times, sitting Relieving factors: taking off prosthesis and medications  PRECAUTIONS: Fall  RED FLAGS: None   WEIGHT BEARING RESTRICTIONS: No  FALLS:  Has patient fallen in last 6 months? Yes. Number of falls 2, forgetting he doesn't have a leg and put weight onto the left side. He did not hit the floor, only fell into another person. Occurred when transferring from sitting to standing  LIVING ENVIRONMENT: Lives with: lives with their spouse Lives in: Other Trailer on grass Stairs: Yes: External: 4 steps; can reach both Has following equipment at home: Single point cane, Environmental consultant - 2 wheeled, Wheelchair (manual), and shower chair  OCCUPATION: retired   PLOF: Independent with household mobility with device  PATIENT GOALS: ambulating and ADLs with cane   NEXT MD VISIT: Office MD visit: 03/21/2024   OBJECTIVE:  Patient-Specific Activity Scoring Scheme  0 represents  "unable to perform." 10 represents "able to perform at prior level. 0 1 2 3 4 5 6 7 8 9  10 (Date and Score)   Activity Eval     1. Walking w/ cane  2    2. Standing for ADLs  5    3. Stairs 5   4.    5.  Score 4    Total score = sum of the activity scores/number of activities Minimum detectable change (90%CI) for average score = 2 points Minimum detectable change (90%CI) for single activity score = 3 points  DIAGNOSTIC FINDINGS: none post-amputation  COGNITION: Overall cognitive status: Within functional limits for tasks assessed   SENSATION: WFL  MUSCLE LENGTH:   POSTURE: rounded shoulders, forward head, flexed trunk , and weight shift right  LOWER EXTREMITY ROM:  ROM P:passive  A:active Left eval  Hip flexion   Hip extension Thomas position: P: - 40*  Hip abduction   Hip adduction   Hip internal rotation   Hip external rotation   Knee flexion   Knee extension   Ankle dorsiflexion   Ankle plantarflexion   Ankle inversion   Ankle eversion    (Blank rows = not tested)  LOWER EXTREMITY MMT:  MMT Right eval Left eval  Hip flexion 3/5 3/5  Hip extension 3/5 3-/5  Hip abduction  3-/5  Hip adduction    Hip internal rotation    Hip external rotation    Knee flexion    Knee extension 3/5   Ankle dorsiflexion    Ankle plantarflexion    Ankle inversion    Ankle eversion    At Evaluation all strength testing is grossly seated and functionally standing / gait. (Blank rows = not tested)  BED MOBILITY:  Sit to supine Complete Independence Supine to sit Complete Independence Rolling to Right Complete Independence  TRANSFERS: 04/10/2024: Pt able stand to/from sit into chair without armrest using BUEs on seat and RW for stabilization with supervision/verbal cues.  Eval: Sit to stand: supervision requires a stable object to arise, stabilization w/ walker using UE support, with delayed weight shift onto the prosthesis for stabilization, and uses UE to  bring Lt prosthesis back below his body  Stand to sit: supervision requires a stable object to arise, stabilization w/ walker using UE support, with delayed weight shift onto the prosthesis for stabilization  FUNCTIONAL TESTs:  04/10/2024: Patient able to reach 10 forward and look over his shoulders with single upper extremity support on RW.   Eval:  BERG score 11/56  GAIT: 04/10/2024: Patient ambulated 125' with RW and supervision.   Patient negotiated 12*ramp and 6.5 curb with RW and supervision  Eval: Gait pattern: step to pattern, decreased step length- Right, decreased stance time- Left, decreased hip/knee flexion- Left, circumduction- Left, Left hip hike, antalgic, trendelenburg, lateral lean- Right, trunk flexed, abducted- Left, and poor foot clearance- Left Distance walked: 62 ft Assistive device utilized: Walker - 2 wheeled and AKA prosthesis Level of assistance: SBA / steady with RW support but needs cues  Gait velocity: 0.72 ft/sec Comments: excessive weightbearing on BUEs using RW  RAMP : not tested  CURB: not tested  CURRENT PROSTHETIC WEAR ASSESSMENT:   Patient is dependent with: skin check, residual limb care, care of non-amputated limb, prosthetic cleaning, ply sock cleaning, correct ply sock adjustment, proper wear schedule/adjustment, and proper weight-bearing schedule/adjustment Donning prosthesis: SBA / verbal cues Doffing prosthesis: Complete Independence Prosthetic wear tolerance: 6-8 hours, 1x/day, 7 days/week Prosthetic weight bearing tolerance: tolerated standing for 10 minutes with partial weight on prosthesis during BERG balance test with no increased pain. Although he reports standing pain with activities at home. Edema: minimal Residual limb condition: 3x11mm superficial wound distally located, scar with mild adherence, no adductor roll, normal skin color, decreased hair growth present Prosthetic description: silicone liner with suction ring suspension,  ischial containment  socket with flexible inner socket, rotator unit, SAFETY (single axis friction engaging with an extension assist), flexible keel foot   TODAY'S TREATMENT:                                                                                                                             DATE: 04/12/2024 Prosthetic Training with Transfemoral Prosthesis  PT discussing wear time with transfemoral prosthesis. Patient noting increasing time to 2x6 hours with 2 hour break as of yesterday, but described day as more of 8 hours on, 30 minutes off, and 8 hours on before removing prosthesis for the night. Patient does endorse removing prosthesis for about 2 hours occasionally, however, does not like to sit still for long periods of time and does not want to perform mobility with RW without prosthesis. PT reminding patient to give himself enough breaks built in for sweat checks, drying residual limb and prosthesis, and for no overuse. Patient endorsing completing dry time breaks within his day for about 5-10 minutes a day.  PT discussed ply socks with patient and their importance especially over the next few months before he receives a new socket as his residual limb is going to change shape and the ply socks will assist with appropriate fit. Patient endorses returning to clinic after session to make next appointment. PT reminded patient to bring ply socks to next session if he were to receive them while visiting the clinic to make his next appointment with the prosthetist. Reassessed height of RW as patient ambulated with increased trunk flexion and thoracic kyphosis at beginning of session. PT increased height of RW to appropriate height by two settings in order to decrease postural compensations; patient performing with improved posture after adjustments. Patient performing several sit<>stands from standard arm chair with close SBA with intermittent difficulty bending prosthesis when sitting. No required  increased physical assist from PT or uncontrolled movements. Patient performing household distance ambulation, ~2x30', with RW and close SBA with noted very large Lt step lengths. Patient will benefit from gait training to equalize step lengths and stance time.  Neuro Re-Ed:  Patient performing multiple balance activities this date with CGA-minA while standing in a corner and having a chair in front of him for safety and confidence.  2x30s with narrow BOS; increased sway noted but no need for increased physical assistance from PT  2x30s with semi tandem stance; increased sway noted with minimal instances of posterior LOB; patient able to catch himself on wall and self-correct back into starting stance  2x30s with narrow BOS and horizontal head turns; patient requiring increased assist to minA for lateral and posterior LOB that was worse with Lt head turn  2x30s with narrow BOS and vertical head turns; patient experiencing increased sway and posterior LOB with patient able to catch on wall and self-correct back into starting stance  1x30s with narrow BOS and eyes closed; patient experiencing fear and few instances of LOB (posterior, anterior, and lateral)  with only one instance of requiring increased physical assist from PT to return to starting stance PT discussed with patient about not beginning these exercises at home yet due to busy home life and plenty of exercises already on HEP, but to expect that they will eventually be added.  TODAY'S TREATMENT:                                                                                                                             DATE: 04/10/2024 Prosthetic Training with Transfemoral Prosthesis  PT instructed patient to wear prosthesis 4-5 hours/2x a day with a 2 hour break with goal to build to wear most of his awake hours.  PT instructed patient that increasing wear increases his ability to improve overall function throughout his day.  Taking the mid break  typically increases wear time without increasing limb pain or skin issues.  Patient and wife verbalized understanding. PT verbally educated patient about the use of ply socks.  Patient reports she did not receive any socks when he received his prosthesis.  PT recommended asking prosthetist at his next visit.  Patient verbalized understanding PT cued patient on proper foot position to facilitate prosthetic knee stability/extension in stance for standing ADLs.  Patient able to reach 10 forward and look over his shoulders with single upper extremity support on RW.  Patient ambulated 125' with RW and supervision.  Performed the stand to sit into chair without armrest using BUEs on seat with supervision/verbal cues. Patient negotiated 12*ramp and 6.5 curb with RW and supervision PT verbally educated and demo how to negotiate stairs with step-to pattern, cane, and using single rail.  Patient able to negotiate 3 steps with right rail/LUE cane and 3 steps with left rail/RUE cane with min/CGA.  PT also instructed in moving walker prior to performing stairs when going out of the community with family.  Patient demoed and verbalized understanding. Patient is unsteady during activity.    TREATMENT:                                                                                                                             DATE: 04/05/2024 Prosthetic Training with Transfemoral Prosthesis  Patient ambulating with SPC with quad tip and CGA from PT for 68.3' to focus on appropriate weight shift and Rt swing through. Patient began ambulating with downward gaze, decreased Rt step length and increase Lt abduction. With PT  verbal cues given intermittently throughout, patient began to have appropriate carryover without need for increased verbal cues.  Patient ambulating with SPC with quad tip and CGA from PT for 58.5' to leg press. Patient performed with improved performance when compared to above. Continued to require  intermittent cues for upright posture and weight shift, though less than above. Patient with intermittent small shuffling steps secondary to minimal LOB; able to self correct with no need for increased physical assist.  Patient ambulating 26.7' with very close CGA and SPC with quad tip after performing leg press. Patient with increased fatigue in Rt LE and unsteady on feet, but able to complete with no need for increased assist.  Patient performed square balance on floor with SPC with quad tip and very close CGA. Patient performed with uneven step lengths, but performed weight shift onto Lt LE for Rt side step with ease. Fwd/back more difficult. Patient performing square one time in each direction before requiring rest break secondary to fear.  Patient performing fwd/back ambulation with SPC with quad tip and CGA with counter on Lt. Patient with more confidence and equal step lengths with Rt and Lt LEs. Though patient performed with equal step lengths, the step lengths were shorter than typical.  No lateral, fwd, or back LOB with no need for increased assist or UE use on counter throughout activity. Patient completed 3 fwd and 3 back before returning to seated position.  Patient performing several sit<>stands from different surfaces [facility], leg press machine, and 19 arm chair) throughout session using SPC with quad tip and SBA>CGA from PT. Patient performing with appropriate form at beginning of session, but as fatigue set in , form began to break down, though did not require increased assist.   TherEx:  Bilat LE leg press 2x8 with 56#. Attempted 75#, but had form breakdown with Rt LE and difficulty secondary to heaviness. PT giving verbal cues to decrease Rt LE hyperextension moment with appropriate carryover from patient.  TREATMENT:                                                                                                                             DATE: 04/02/2024 Therapeutic Activities: PT  explained to patient that AKA surgery alters adductor muscles but not abductor muscles creating an imbalance.  If abductors over power the adductors then his residual limb is pulled outward against socket creating pain where he is reporting it.  PT instructed pt in the following exercises to facilitate improved muscle balance which would keep limb more central in socket.  Pt did report less following the exercises.  Adduction ball squeezes in sitting, focusing on Lt LE primarily adducting and squeezing 10 reps Adduction ball squeezes in standing, focusing on Lt LE primarily adducting and squeezing 10 reps PT recommended doing these exercises 2-3 times per day.  Pt verbalized understanding.   Prosthetic Training with Transfemoral Prosthesis Prosthetic gait training focusing on decreasing abduction of prosthetic limb.  PT using visual cues, then visual with proprioception followed by only proprioception. Patient ambulated 81' with cane and minA.  Using a line on floor to the erect position of the foot so the base of support was within 4 inches.  PT demo and verbal cues on not allowing some limb heel to roll into the line of progression and keeping medial prosthetic heel within 2 inches of the line. Patient ambulated 79' with cane and minA.  Patient added to the above cueing for patient to take 4 steps on the prosthetic limb with the prosthesis in the correct position and to with the prosthetic limb abducted outside the proper position.  Patient repeated this cycle to increase proprioception of when the foot was in the proper position. Patient ambulated 75' x 2 with cane and minA.  For the initial 25' PT had the patient repeat the process of 4 steps with the prosthetic limb in the proper step width and 2 steps abducted for proprioception without the visual cue of the line.  Patient reported being able to perceive when his foot was in the proper position following above instruction and less distal lateral  pain.      PATIENT EDUCATION: PATIENT EDUCATED ON FOLLOWING PROSTHETIC CARE: Education details: Sitting positioning to limit skin traction from weight of prosthesis, how hip flexion contracture effects residual limb pain & posture,  Prosthetic cleaning and Propper donning  Person educated: Patient, Spouse, and close family friend Education method: Explanation, Demonstration, Tactile cues, and Verbal cues Education comprehension: verbalized understanding and needs further education  HOME EXERCISE PROGRAM: Access Code: [email or web address] URL: [email or web address] Date: 03/20/2024 Prepared by: Grayce Spatz  Exercises - Modified Thomas Stretch  - 2 x daily - 7 x weekly - 1 sets - 2-3 reps - 30 seconds hold - Hip Flexor Stretch at Edge of Bed  - 2 x daily - 7 x weekly - 1 sets - 2-3 reps - 30 seconds hold - Standing posture with back to counter  - 2 x daily - 7 x weekly - 1 sets - 1 reps - 5-10 minutes hold - Upright Stance at Door Frame Single Arm  - 3-6 x daily - 7 x weekly - 1 sets - 2 reps - 2 deep breathes hold - Upright Stance at Door Frame with Both Arms  - 3-6 x daily - 7 x weekly - 1 sets - 2 reps - 2 deep breathes hold  Do each exercise 1-2  times per day Do each exercise 5-10 repetitions Hold each exercise for 2 seconds to feel your location  AT SINK FIND YOUR MIDLINE POSITION AND PLACE FEET EQUAL DISTANCE FROM THE MIDLINE.  Try to find this position when standing still for activities.   USE TAPE ON FLOOR TO MARK THE MIDLINE POSITION which is even with middle of sink.  You also should try to feel with your limb pressure in socket.  You are trying to feel with limb what you used to feel with the bottom of your foot.  Side to Side Shift: Moving your hips only (not shoulders): move weight onto your left leg, HOLD/FEEL pressure in socket.  Move back to equal weight on each leg, HOLD/FEEL pressure in socket. Move weight onto your right leg, HOLD/FEEL pressure in socket. Move  back to equal weight on each leg, HOLD/FEEL pressure in socket. Repeat.  Start with both hands on sink, progress to hand on prosthetic side only, then no hands.  Front to Back Shift: Moving your hips  only (not shoulders): move your weight forward onto your toes, HOLD/FEEL pressure in socket. Move your weight back to equal Flat Foot on both legs, HOLD/FEEL  pressure in socket. Move your weight back onto your heels, HOLD/FEEL  pressure in socket. Move your weight back to equal on both legs, HOLD/FEEL  pressure in socket. Repeat.  Start with both hands on sink, progress to hand on prosthetic side only, then no hands.  Moving Cones / Cups: With equal weight on each leg: Hold on with one hand the first time, then progress to no hand supports. Move cups from one side of sink to the other. Place cups ~2" out of your reach, progress to 10" beyond reach.  Place one hand in middle of sink and reach with other hand. Do both arms.  Then hover one hand and move cups with other hand.  Overhead/Upward Reaching: alternated reaching up to top cabinets or ceiling if no cabinets present. Keep equal weight on each leg. Start with one hand support on counter while other hand reaches and progress to no hand support with reaching.  ace one hand in middle of sink and reach with other hand. Do both arms.  Then hover one hand and move cups with other hand.  5.   Looking Over Shoulders: With equal weight on each leg: alternate turning to look over your shoulders with one hand support on counter as needed.  Start with head motions only to look in front of shoulder, then even with shoulder and progress to looking behind you. To look to side, move head /eyes, then shoulder on side looking pulls back, shift more weight to side looking and pull hip back. Place one hand in middle of sink and let go with other hand so your shoulder can pull back. Switch hands to look other way.   Then hover one hand and look over shoulder. If looking right, use  left hand at sink. If looking left, use right hand at sink. 6.  Stepping with leg that is not amputated:  Move items under cabinet out of your way. Shift your hips/pelvis so weight on prosthesis. Tighten muscles in hip on prosthetic side.  SLOWLY step other leg so front of foot is in cabinet. Then step back to floor.    ASSESSMENT:  CLINICAL IMPRESSION:  Patient arrived to session noting no increase in discomfort in lateral inferior residual limb, though has had increased wear time with prosthesis as well as increased time on feet. PT discussed importance of continuing breaks to dry prosthesis as well as continuing with skin checks to assess whether he has increased wear time too quickly. Patient endorsing continuing to consistently perform HEP with no questions or concerns. Patient ambulating with improved performance and posture after PT readjusted height of RW, however, patient will benefit from gait training to improve Lt step length and Lt stance time. Patient will continue to benefit from skilled PT.   OBJECTIVE IMPAIRMENTS: Abnormal gait, decreased activity tolerance, decreased balance, decreased endurance, decreased knowledge of condition, decreased knowledge of use of DME, decreased mobility, difficulty walking, decreased ROM, decreased strength, impaired flexibility, postural dysfunction, prosthetic dependency , and pain.   ACTIVITY LIMITATIONS: carrying, lifting, bending, sitting, standing, squatting, stairs, transfers, bathing, toileting, dressing, reach over head, and locomotion level  PARTICIPATION LIMITATIONS: meal prep, cleaning, laundry, driving, shopping, community activity, and yard work  PERSONAL FACTORS: Age, Fitness, Past/current experiences, Time since onset of injury/illness/exacerbation, and 3+ comorbidities: see PMH are also affecting patient's functional outcome.  REHAB POTENTIAL: Good  CLINICAL DECISION MAKING: Evolving/moderate complexity  EVALUATION COMPLEXITY:  Moderate   GOALS: Goals reviewed with patient? Yes  SHORT TERM GOALS: Target date: 04/05/2024  Patient donnes prosthesis modified independent & verbalizes proper cleaning. Baseline: SEE OBJECTIVE DATA Goal status: MET, 04/10/2024  2.  Patient tolerates prosthesis >12 hrs total /day without skin issues or limb pain >7/10 after standing. Baseline: patient reports wearing 4-5 hr/day Goal status: NOT MET, 04/10/2024  3.  Patient able to reach 7 and look over both shoulders with RW support with Modified independent. Baseline: SEE OBJECTIVE DATA Goal status: MET, 04/10/2024  4. Patient ambulates 125' with RW & prosthesis with supervision. Baseline: SEE OBJECTIVE DATA Goal status: MET, 04/10/2024  5. Patient negotiates ramps & curbs with RW & prosthesis with supervision. Baseline: SEE OBJECTIVE DATA Goal status: MET, 04/10/2024  LONG TERM GOALS: Target date: 05/31/2024  Patient demonstrates & verbalized understanding of prosthetic care to enable safe utilization of prosthesis. Baseline: SEE OBJECTIVE DATA Goal status: Ongoing   03/20/2024  Patient tolerates prosthesis wear >90% of awake hours without skin issues or limb pain >4/10. Baseline: SEE OBJECTIVE DATA Goal status: Ongoing   03/20/2024  BERG >/= 25/56 to indicate lower fall risk Baseline: SEE OBJECTIVE DATA Goal status: Ongoing   03/20/2024  Patient ambulates >300' with prosthesis and LRAD independently Baseline: SEE OBJECTIVE DATA Goal status: Ongoing   03/20/2024  Patient negotiates ramps, curbs & stairs with single rail with prosthesis and LRAD independently. Baseline: SEE OBJECTIVE DATA Goal status: Ongoing   03/20/2024  6.  Patient reports Patient-Specific Activity Score improved the average by 3 to indicate improvement in functional activities.   Baseline: SEE OBJECTIVE DATA Goal status: Ongoing   03/20/2024  PLAN:  PT FREQUENCY: 2x/week  PT DURATION: 12 weeks  PLANNED INTERVENTIONS: 97164- PT Re-evaluation, 97750-  Physical Performance Testing, 97110-Therapeutic exercises, 97530- Therapeutic activity, 97112- Neuromuscular re-education, 97535- Self Care, [id number]- Manual therapy, [phone]- Gait training, [phone]- Prosthetic Initial , [phone]- Orthotic/Prosthetic subsequent, Patient/Family education, Balance training, Stair training, Joint mobilization, and DME instructions  PLAN FOR NEXT SESSION:  set updated STGs target 10/2,  standing balance activities, stairs, 4 square step test, static/dynamic balance with scanning   Susannah Daring, PT, DPT 04/12/24 3:48 PM    Date of referral: 02/13/2024 Referring provider: Harden Jerona GAILS, MD Referring diagnosis? S10.387 (ICD-10-CM) - Hx of AKA (above knee amputation), left (HCC)  Treatment diagnosis? (if different than referring diagnosis) R26.89, M62.81, M79.605, M25.652, R29.3, R26.81, L98.498  What was this (referring dx) caused by? Other: Lt AKA  Nature of Condition: Initial Onset (within last 3 months)   Laterality: Lt  Current Functional Measure Score: Patient Specific Functional Scale Score:4  Objective measurements identify impairments when they are compared to normal values, the uninvolved extremity, and prior level of function.  [x]  Yes  []  No  Objective assessment of functional ability: Moderate functional limitations   Briefly describe symptoms: Patient is dependent in prosthetic care and use with high risk of falls. Gait deviation, muscle weakness, pain in Lt residual limb, hip flexion contracture.  How did symptoms start: amputation due to vascular issues  Average pain intensity:  Last 24 hours: 8-9/10  Past week: 8-9/10  How often does the pt experience symptoms? Constantly  How much have the symptoms interfered with usual daily activities? Quite a bit  How has condition changed since care began at this facility? NA - initial visit  In general, how is the patients overall health? Good  BACK PAIN (STarT Back Screening Tool) No

## 2024-04-12 ENCOUNTER — Ambulatory Visit

## 2024-04-12 DIAGNOSIS — R2681 Unsteadiness on feet: Secondary | ICD-10-CM

## 2024-04-12 DIAGNOSIS — R293 Abnormal posture: Secondary | ICD-10-CM | POA: Diagnosis not present

## 2024-04-12 DIAGNOSIS — R2689 Other abnormalities of gait and mobility: Secondary | ICD-10-CM

## 2024-04-12 DIAGNOSIS — M6281 Muscle weakness (generalized): Secondary | ICD-10-CM | POA: Diagnosis not present

## 2024-04-12 DIAGNOSIS — M25652 Stiffness of left hip, not elsewhere classified: Secondary | ICD-10-CM | POA: Diagnosis not present

## 2024-04-16 ENCOUNTER — Encounter: Payer: Self-pay | Admitting: Physical Therapy

## 2024-04-16 ENCOUNTER — Ambulatory Visit: Admitting: Physical Therapy

## 2024-04-16 DIAGNOSIS — M25652 Stiffness of left hip, not elsewhere classified: Secondary | ICD-10-CM | POA: Diagnosis not present

## 2024-04-16 DIAGNOSIS — R2689 Other abnormalities of gait and mobility: Secondary | ICD-10-CM | POA: Diagnosis not present

## 2024-04-16 DIAGNOSIS — R293 Abnormal posture: Secondary | ICD-10-CM | POA: Diagnosis not present

## 2024-04-16 DIAGNOSIS — M79605 Pain in left leg: Secondary | ICD-10-CM | POA: Diagnosis not present

## 2024-04-16 DIAGNOSIS — R2681 Unsteadiness on feet: Secondary | ICD-10-CM

## 2024-04-16 DIAGNOSIS — M6281 Muscle weakness (generalized): Secondary | ICD-10-CM

## 2024-04-16 NOTE — Therapy (Addendum)
 OUTPATIENT PHYSICAL THERAPY PROSTHETIC TREATMENT/PROGRESS NOTE  Progress Note Reporting Period 03/05/2024 to 04/16/2024  See note below for Objective Data and Assessment of Progress/Goals.    Patient Name: Mike Floyd MRN: [id number] DOB:[birth year], 63 y.o., male Today's Date: 04/16/2024  END OF SESSION:  PT End of Session - 04/16/24 1432     Visit Number 10    Number of Visits 25    Date for PT Re-Evaluation 05/31/24    Authorization Type [employer] MEDICARE    Authorization Time Period $45 copay    Progress Note Due on Visit 10    PT Start Time 1430    PT Stop Time 1515    PT Time Calculation (min) 45 min    Equipment Utilized During Treatment Gait belt    Activity Tolerance Patient tolerated treatment well;Patient limited by fatigue    Behavior During Therapy WFL for tasks assessed/performed            Past Medical History:  Diagnosis Date   AICD (automatic cardioverter/defibrillator) present 11/2007   Removed in 2018, a. 11/2007 S[facility] Current VR - single lead ICD  - Removed 2018 - it was burning me   Anxiety    CAD (coronary artery disease)    non-obstructive CAD by Cor CT in 2020 // Myoview  3/22: EF 57, small inf-sept defect c/w scar, no ischemia; low risk      Chest pain    a. 10/2007 Cath:  normal Cors.   CKD (chronic kidney disease), stage II    DDD (degenerative disc disease), lumbar    Diabetes mellitus DX: 2010   type 2   Erosive esophagitis    a. per EGD (08/2011), Dr. Golda - Erosive reflux esophagitis improved but not completely healed since previous EGD 3 years ago. Bx showing  ulcerated gatroesophageal junction mucosa. negative for H. pylori   GERD (gastroesophageal reflux disease)    Gout    Hearing deficit    a. wear bilateral hearing aides   History of hiatal hernia    History of kidney stones    passed stones, no surgery   Hypertension    Mildly dilatd aortic root (HCC)    CMR 4/22: EF 52, no LGE; d/w Dr. Ione root 38 mm (mildly dilated)    Myocardial infarction [facility]) 2011   Neuropathy    Feet and legs   Nonischemic dilated cardiomyopathy (HCC)    a. H/O EF as low as 35-40% by LV gram 10/2007;  b. Echo 02/2011 EF 50-55%, inf HK, Gr 1 DD // CMR 4/22: EF 52, no LGE; d/w Dr. Ione root 38 mm (mildly dilated)    Renal insufficiency    Sleep apnea    pt doesnt use, statesI cant afford one. PCP aware   Stroke (HCC)    mini-stroke in 2014   TIA (transient ischemic attack)    July, 2013   Tobacco abuse, in remission 06/27/2009   Discontinued in 2009     Wears dentures    top plate   WPW (Wolff-Parkinson-White syndrome)    a. s/p RFCA @ [facility] - 1999   Past Surgical History:  Procedure Laterality Date   AMPUTATION Left 05/19/2022   Procedure: TRANSMETATARSAL AMPUTATION LEFT FOOT;  Surgeon: Harden Jerona GAILS, MD;  Location: [facility] OR;  Service: Orthopedics;  Laterality: Left;   AMPUTATION Left 06/17/2023   Procedure: LEFT BELOW KNEE AMPUTATION;  Surgeon: Harden Jerona GAILS, MD;  Location: [facility] OR;  Service: Orthopedics;  Laterality: Left;   AMPUTATION Left  08/19/2023   Procedure: LEFT ABOVE KNEE AMPUTATION;  Surgeon: Harden Jerona GAILS, MD;  Location: [facility] OR;  Service: Orthopedics;  Laterality: Left;   APPENDECTOMY     BACK SURGERY     1995   CARDIAC CATHETERIZATION     2009   CARDIAC DEFIBRILLATOR PLACEMENT     ICD was removed in 2018   CHOLECYSTECTOMY     CHOLECYSTECTOMY, LAPAROSCOPIC     11/2007   COLONOSCOPY W/ POLYPECTOMY  2009   ELBOW SURGERY Left 06/2010   ESOPHAGEAL DILATION N/A 11/08/2014   Procedure: ESOPHAGEAL DILATION;  Surgeon: Claudis RAYMOND Rivet, MD;  Location: [facility] ORS;  Service: Endoscopy;  Laterality: N/A;  #56,    ESOPHAGOGASTRODUODENOSCOPY  03/31/2012   also 08/2011; [name]   ESOPHAGOGASTRODUODENOSCOPY (EGD) WITH PROPOFOL  N/A 11/08/2014   Procedure: ESOPHAGOGASTRODUODENOSCOPY (EGD) WITH PROPOFOL ;  Surgeon: Claudis RAYMOND Rivet, MD;  Location: [facility] ORS;  Service: Endoscopy;  Laterality: N/A;  Hiatus is 22 , GE Junction is 37    FOOT ARTHRODESIS Left 10/24/2020   Procedure: LEFT GASTROCNEMIUS RECESSION, DORSIFLEXION OSTEOTOMY 1ST MT;  Surgeon: Harden Jerona GAILS, MD;  Location: [facility] OR;  Service: Orthopedics;  Laterality: Left;   FOOT ARTHRODESIS Right 01/09/2021   Procedure: CLOSING WEDGE OSTEOTOMY RIGHT 1ST METATARSAL;  Surgeon: Harden Jerona GAILS, MD;  Location: [facility] OR;  Service: Orthopedics;  Laterality: Right;   GASTROCNEMIUS RECESSION Right 01/09/2021   Procedure: RIGHT GASTROCNEMIUS RECESSION;  Surgeon: Harden Jerona GAILS, MD;  Location: [facility] OR;  Service: Orthopedics;  Laterality: Right;   ICD LEAD REMOVAL N/A 10/07/2016   Procedure: ICD LEAD REMOVAL ;  Surgeon: Danelle LELON Birmingham, MD;  Location: [facility] OR;  Service: Cardiovascular;  Laterality: N/A;   MULTIPLE TOOTH EXTRACTIONS     RADIOFREQUENCY ABLATION  1999   WPW; performed at [facility]   STUMP REVISION Left 07/22/2023   Procedure: REVISION LEFT BELOW KNEE AMPUTATION;  Surgeon: Harden Jerona GAILS, MD;  Location: [facility] OR;  Service: Orthopedics;  Laterality: Left;   TEE WITHOUT CARDIOVERSION N/A 10/07/2016   Procedure: TRANSESOPHAGEAL ECHOCARDIOGRAM (TEE);  Surgeon: Danelle LELON Birmingham, MD;  Location: [facility] OR;  Service: Cardiovascular;  Laterality: N/A;   Patient Active Problem List   Diagnosis Date Noted   Dyslipidemia 11/03/2023   Essential hypertension 11/03/2023   Type 2 diabetes mellitus with peripheral neuropathy (HCC) 11/03/2023   BPH (benign prostatic hyperplasia) 11/03/2023   GERD without esophagitis 11/03/2023   S/P AKA (above knee amputation) unilateral, left (HCC) 08/19/2023   Dehiscence of amputation stump of left lower extremity (HCC) 07/22/2023   Ischemia of site of left below knee amputation (HCC) 07/22/2023   S/P BKA (below knee amputation) unilateral, left (HCC) 07/22/2023   Below-knee amputation of left lower extremity (HCC) 06/17/2023   Acute osteomyelitis of metatarsal bone of left foot (HCC)    Subacute osteomyelitis, left ankle and foot (HCC) 05/13/2022    Non-pressure chronic ulcer of other part of right foot limited to breakdown of skin (HCC)    Contracture of right Achilles tendon    Mildly dilatd aortic root (HCC)    Contracture of left Achilles tendon    Metatarsal deformity, left    CAD (coronary artery disease)    Hallux hammertoe, left 12/11/2019   Diarrhea 06/27/2019   Diastasis recti 08/22/2018   Umbilical hernia without obstruction and without gangrene 08/22/2018   Vitamin D  deficiency 06/23/2017   Essential hypertension, benign 06/15/2017   Mixed hyperlipidemia 06/15/2017   Class 1 obesity due to excess calories with serious  comorbidity and body mass index (BMI) of 31.0 to 31.9 in adult 06/15/2017   Malfunction of implantable cardioverter-defibrillator (ICD) electrode 10/07/2016   Chronic kidney disease (CKD), stage III (moderate) (HCC) 04/04/2015   Bladder neck obstruction 03/21/2015   Difficult or painful urination 03/21/2015   Flank pain 03/21/2015   Delayed onset of urination 03/21/2015   Calculus of kidney 03/21/2015   Erosive esophagitis 12/27/2013   Hypotension due to drugs 07/02/2013   Chronic systolic heart failure (HCC) 04/24/2013   CAP (community acquired pneumonia) 03/16/2013   HTN (hypertension) 03/15/2013   Hydrocele 03/15/2013   Spermatocele 03/15/2013   DOE (dyspnea on exertion) 03/15/2013   OSA (obstructive sleep apnea) 01/09/2013   Chronic kidney disease, stage 3 (HCC) 01/03/2013   Chest pain 12/26/2012   History of diagnostic tests 09/06/2012   TIA (transient ischemic attack)    Cardiomyopathy, nonischemic (HCC) 02/24/2010   AICD (automatic cardioverter/defibrillator) 02/24/2010   DM type 2 causing vascular disease (HCC) 06/27/2009   Gout 06/27/2009   Tobacco abuse, in remission 06/27/2009   COLONIC POLYPS 10/31/2008   GERD (gastroesophageal reflux disease) 10/31/2008    PCP: Bertell Satterfield, MD  REFERRING PROVIDER: Harden Jerona GAILS, MD  REFERRING DIAG: [phone] (ICD-10-CM) - Hx of AKA (above  knee amputation), left (HCC)   THERAPY DIAG:  Other abnormalities of gait and mobility  Muscle weakness (generalized)  Stiffness of left hip, not elsewhere classified  Abnormal posture  Unsteadiness on feet  Pain in left leg  Rationale for Evaluation and Treatment: Rehabilitation  ONSET DATE: Prosthesis delivery 02/20/2024  SUBJECTIVE:   SUBJECTIVE STATEMENT: Patient has been having personal problems which have made him walk longer distances and done less HEP. Patient is fatigued coming into PT session  PERTINENT HISTORY: DM2, GERD, hypertension, dyslipidemia, CAD, L BKA (06/17/23), neuropathy  Eval: Patient is a 63 year old male who presents to physical therapy with a left AKA (08/19/2023) with a new prosthesis delivery on 02/20/2024. Patient had previous home health PT prior to Lt AKA. He is able to mobilize with modified independence w/ walker. Wears prosthesis about ten hours a day and by the end of the night he takes it off due to fatigue and pain.   PAIN:  Are you having pain? Yes: NPRS scale: 3/10 at rest, worst up to 5/10 Pain location: Femur in prosthesis and pain in the Rt leg as well Pain description: feels like its been hammered Aggravating factors: keeping prosthesis on for long periods of times, sitting Relieving factors: taking off prosthesis and medications  PRECAUTIONS: Fall  RED FLAGS: None   WEIGHT BEARING RESTRICTIONS: No  FALLS:  Has patient fallen in last 6 months? Yes. Number of falls 2, forgetting he doesn't have a leg and put weight onto the left side. He did not hit the floor, only fell into another person. Occurred when transferring from sitting to standing  LIVING ENVIRONMENT: Lives with: lives with their spouse Lives in: Other Trailer on grass Stairs: Yes: External: 4 steps; can reach both Has following equipment at home: Single point cane, Environmental consultant - 2 wheeled, Wheelchair (manual), and shower chair  OCCUPATION: retired   PLOF: Independent  with household mobility with device  PATIENT GOALS: ambulating and ADLs with cane   NEXT MD VISIT: Office MD visit: 03/21/2024   OBJECTIVE:  Patient-Specific Activity Scoring Scheme  0 represents "unable to perform." 10 represents "able to perform at prior level. 0 1 2 3 4 5 6 7 8 9  10 (Date and Score)  Activity Eval     1. Walking w/ cane  2    2. Standing for ADLs  5    3. Stairs 5   4.    5.    Score 4    Total score = sum of the activity scores/number of activities Minimum detectable change (90%CI) for average score = 2 points Minimum detectable change (90%CI) for single activity score = 3 points  DIAGNOSTIC FINDINGS: none post-amputation  COGNITION: Overall cognitive status: Within functional limits for tasks assessed   SENSATION: WFL  MUSCLE LENGTH:   POSTURE: rounded shoulders, forward head, flexed trunk , and weight shift right  LOWER EXTREMITY ROM:  ROM P:passive  A:active Left eval  Hip flexion   Hip extension Thomas position: P: - 40*  Hip abduction   Hip adduction   Hip internal rotation   Hip external rotation   Knee flexion   Knee extension   Ankle dorsiflexion   Ankle plantarflexion   Ankle inversion   Ankle eversion    (Blank rows = not tested)  LOWER EXTREMITY MMT:  MMT Right eval Left eval  Hip flexion 3/5 3/5  Hip extension 3/5 3-/5  Hip abduction  3-/5  Hip adduction    Hip internal rotation    Hip external rotation    Knee flexion    Knee extension 3/5   Ankle dorsiflexion    Ankle plantarflexion    Ankle inversion    Ankle eversion    At Evaluation all strength testing is grossly seated and functionally standing / gait. (Blank rows = not tested)  BED MOBILITY:  Sit to supine Complete Independence Supine to sit Complete Independence Rolling to Right Complete Independence  TRANSFERS: 04/10/2024: Pt able stand to/from sit into chair without armrest using BUEs on seat and RW for stabilization with  supervision/verbal cues.  Eval: Sit to stand: supervision requires a stable object to arise, stabilization w/ walker using UE support, with delayed weight shift onto the prosthesis for stabilization, and uses UE to bring Lt prosthesis back below his body  Stand to sit: supervision requires a stable object to arise, stabilization w/ walker using UE support, with delayed weight shift onto the prosthesis for stabilization  FUNCTIONAL TESTs:  04/10/2024: Patient able to reach 10 forward and look over his shoulders with single upper extremity support on RW.   Eval:  BERG score 11/56  GAIT: 04/10/2024: Patient ambulated 125' with RW and supervision.   Patient negotiated 12*ramp and 6.5 curb with RW and supervision  Eval: Gait pattern: step to pattern, decreased step length- Right, decreased stance time- Left, decreased hip/knee flexion- Left, circumduction- Left, Left hip hike, antalgic, trendelenburg, lateral lean- Right, trunk flexed, abducted- Left, and poor foot clearance- Left Distance walked: 62 ft Assistive device utilized: Walker - 2 wheeled and AKA prosthesis Level of assistance: SBA / steady with RW support but needs cues  Gait velocity: 0.72 ft/sec Comments: excessive weightbearing on BUEs using RW  RAMP : not tested  CURB: not tested  CURRENT PROSTHETIC WEAR ASSESSMENT:   Patient is dependent with: skin check, residual limb care, care of non-amputated limb, prosthetic cleaning, ply sock cleaning, correct ply sock adjustment, proper wear schedule/adjustment, and proper weight-bearing schedule/adjustment Donning prosthesis: SBA / verbal cues Doffing prosthesis: Complete Independence Prosthetic wear tolerance: 6-8 hours, 1x/day, 7 days/week Prosthetic weight bearing tolerance: tolerated standing for 10 minutes with partial weight on prosthesis during BERG balance test with no increased pain. Although he reports standing pain with activities  at home. Edema: minimal Residual limb  condition: 3x63mm superficial wound distally located, scar with mild adherence, no adductor roll, normal skin color, decreased hair growth present Prosthetic description: silicone liner with suction ring suspension, ischial containment socket with flexible inner socket, rotator unit, SAFETY (single axis friction engaging with an extension assist), flexible keel foot   TODAY'S TREATMENT:                                                                                                                    DATE: 04/16/2024 Prosthetic Training with Transfemoral Prosthesis  PT verbally educated patient about using ply socks appropriately including donning. Trialed different ply sock sizes and ambulated to feel appropriate fit.  Patient verbalized and demoed understanding Patient ambulated 14' with cane and CGA and performed a stand to sit into chair with no armrests.  Patient fatigued after exercise Patient ambulated 25' with cane and CGA.  Patient had a loss of balance which required maxA x2 to recover from to prevent a fall.  Patient ambulated 38' with cane and CGA.  Minimal loss of balance when transferring from cane to RW  Neuro Re-Ed Following exercises were done with blue Thera-Band to challenge standing balance Single arm row with each arm independently and bilateral rows, 5x Single arm forward punch with each arm independently and bilateral punches, 5x Single bicep curl plus shoulder flexion with each arm independently and bilateral bicep curl plus shoulder flexion, 5x   TREATMENT:                                                                                                                             DATE: 04/12/2024 Prosthetic Training with Transfemoral Prosthesis  PT discussing wear time with transfemoral prosthesis. Patient noting increasing time to 2x6 hours with 2 hour break as of yesterday, but described day as more of 8 hours on, 30 minutes off, and 8 hours on before removing prosthesis for  the night. Patient does endorse removing prosthesis for about 2 hours occasionally, however, does not like to sit still for long periods of time and does not want to perform mobility with RW without prosthesis. PT reminding patient to give himself enough breaks built in for sweat checks, drying residual limb and prosthesis, and for no overuse. Patient endorsing completing dry time breaks within his day for about 5-10 minutes a day.  PT discussed ply socks with patient and their importance especially over the next few months before he  receives a new socket as his residual limb is going to change shape and the ply socks will assist with appropriate fit. Patient endorses returning to clinic after session to make next appointment. PT reminded patient to bring ply socks to next session if he were to receive them while visiting the clinic to make his next appointment with the prosthetist. Reassessed height of RW as patient ambulated with increased trunk flexion and thoracic kyphosis at beginning of session. PT increased height of RW to appropriate height by two settings in order to decrease postural compensations; patient performing with improved posture after adjustments. Patient performing several sit<>stands from standard arm chair with close SBA with intermittent difficulty bending prosthesis when sitting. No required increased physical assist from PT or uncontrolled movements. Patient performing household distance ambulation, ~2x30', with RW and close SBA with noted very large Lt step lengths. Patient will benefit from gait training to equalize step lengths and stance time.  Neuro Re-Ed:  Patient performing multiple balance activities this date with CGA-minA while standing in a corner and having a chair in front of him for safety and confidence.  2x30s with narrow BOS; increased sway noted but no need for increased physical assistance from PT  2x30s with semi tandem stance; increased sway noted with minimal  instances of posterior LOB; patient able to catch himself on wall and self-correct back into starting stance  2x30s with narrow BOS and horizontal head turns; patient requiring increased assist to minA for lateral and posterior LOB that was worse with Lt head turn  2x30s with narrow BOS and vertical head turns; patient experiencing increased sway and posterior LOB with patient able to catch on wall and self-correct back into starting stance  1x30s with narrow BOS and eyes closed; patient experiencing fear and few instances of LOB (posterior, anterior, and lateral) with only one instance of requiring increased physical assist from PT to return to starting stance PT discussed with patient about not beginning these exercises at home yet due to busy home life and plenty of exercises already on HEP, but to expect that they will eventually be added.   TREATMENT:                                                                                                                             DATE: 04/10/2024 Prosthetic Training with Transfemoral Prosthesis  PT instructed patient to wear prosthesis 4-5 hours/2x a day with a 2 hour break with goal to build to wear most of his awake hours.  PT instructed patient that increasing wear increases his ability to improve overall function throughout his day.  Taking the mid break typically increases wear time without increasing limb pain or skin issues.  Patient and wife verbalized understanding. PT verbally educated patient about the use of ply socks.  Patient reports she did not receive any socks when he received his prosthesis.  PT recommended asking prosthetist at his next visit.  Patient verbalized understanding  PT cued patient on proper foot position to facilitate prosthetic knee stability/extension in stance for standing ADLs.  Patient able to reach 10 forward and look over his shoulders with single upper extremity support on RW.  Patient ambulated 125' with RW and  supervision.  Performed the stand to sit into chair without armrest using BUEs on seat with supervision/verbal cues. Patient negotiated 12*ramp and 6.5 curb with RW and supervision PT verbally educated and demo how to negotiate stairs with step-to pattern, cane, and using single rail.  Patient able to negotiate 3 steps with right rail/LUE cane and 3 steps with left rail/RUE cane with min/CGA.  PT also instructed in moving walker prior to performing stairs when going out of the community with family.  Patient demoed and verbalized understanding. Patient is unsteady during activity.     PATIENT EDUCATION: PATIENT EDUCATED ON FOLLOWING PROSTHETIC CARE: Education details: Sitting positioning to limit skin traction from weight of prosthesis, how hip flexion contracture effects residual limb pain & posture,  Prosthetic cleaning and Propper donning  Person educated: Patient, Spouse, and close family friend Education method: Explanation, Demonstration, Tactile cues, and Verbal cues Education comprehension: verbalized understanding and needs further education  HOME EXERCISE PROGRAM: Access Code: [email or web address] URL: [email or web address] Date: 03/20/2024 Prepared by: Grayce Spatz  Exercises - Modified Thomas Stretch  - 2 x daily - 7 x weekly - 1 sets - 2-3 reps - 30 seconds hold - Hip Flexor Stretch at Edge of Bed  - 2 x daily - 7 x weekly - 1 sets - 2-3 reps - 30 seconds hold - Standing posture with back to counter  - 2 x daily - 7 x weekly - 1 sets - 1 reps - 5-10 minutes hold - Upright Stance at Door Frame Single Arm  - 3-6 x daily - 7 x weekly - 1 sets - 2 reps - 2 deep breathes hold - Upright Stance at Door Frame with Both Arms  - 3-6 x daily - 7 x weekly - 1 sets - 2 reps - 2 deep breathes hold  Do each exercise 1-2  times per day Do each exercise 5-10 repetitions Hold each exercise for 2 seconds to feel your location  AT SINK FIND YOUR MIDLINE POSITION AND PLACE FEET EQUAL  DISTANCE FROM THE MIDLINE.  Try to find this position when standing still for activities.   USE TAPE ON FLOOR TO MARK THE MIDLINE POSITION which is even with middle of sink.  You also should try to feel with your limb pressure in socket.  You are trying to feel with limb what you used to feel with the bottom of your foot.  Side to Side Shift: Moving your hips only (not shoulders): move weight onto your left leg, HOLD/FEEL pressure in socket.  Move back to equal weight on each leg, HOLD/FEEL pressure in socket. Move weight onto your right leg, HOLD/FEEL pressure in socket. Move back to equal weight on each leg, HOLD/FEEL pressure in socket. Repeat.  Start with both hands on sink, progress to hand on prosthetic side only, then no hands.  Front to Back Shift: Moving your hips only (not shoulders): move your weight forward onto your toes, HOLD/FEEL pressure in socket. Move your weight back to equal Flat Foot on both legs, HOLD/FEEL  pressure in socket. Move your weight back onto your heels, HOLD/FEEL  pressure in socket. Move your weight back to equal on both legs, HOLD/FEEL  pressure in socket. Repeat.  Start with both hands  on sink, progress to hand on prosthetic side only, then no hands.  Moving Cones / Cups: With equal weight on each leg: Hold on with one hand the first time, then progress to no hand supports. Move cups from one side of sink to the other. Place cups ~2" out of your reach, progress to 10" beyond reach.  Place one hand in middle of sink and reach with other hand. Do both arms.  Then hover one hand and move cups with other hand.  Overhead/Upward Reaching: alternated reaching up to top cabinets or ceiling if no cabinets present. Keep equal weight on each leg. Start with one hand support on counter while other hand reaches and progress to no hand support with reaching.  ace one hand in middle of sink and reach with other hand. Do both arms.  Then hover one hand and move cups with other hand.  5.    Looking Over Shoulders: With equal weight on each leg: alternate turning to look over your shoulders with one hand support on counter as needed.  Start with head motions only to look in front of shoulder, then even with shoulder and progress to looking behind you. To look to side, move head /eyes, then shoulder on side looking pulls back, shift more weight to side looking and pull hip back. Place one hand in middle of sink and let go with other hand so your shoulder can pull back. Switch hands to look other way.   Then hover one hand and look over shoulder. If looking right, use left hand at sink. If looking left, use right hand at sink. 6.  Stepping with leg that is not amputated:  Move items under cabinet out of your way. Shift your hips/pelvis so weight on prosthesis. Tighten muscles in hip on prosthetic side.  SLOWLY step other leg so front of foot is in cabinet. Then step back to floor.    ASSESSMENT:  CLINICAL IMPRESSION:  Patient arrived to session with fatigue due to ambulating longer distances throughout the weekend. Patient is gaining confidence and stability during gait with SPC. LOB today likely due to overall fatigue. He no longer requires cues to keep consistent step width during gait and his pain in the lateral portion of his residual limb has greatly minimized. Patient demonstrated overall good standing balance control with new standing activity and is able to self-correct minimal sways. Patient will continue to benefit from skilled PT to address impairments and gain independence.   OBJECTIVE IMPAIRMENTS: Abnormal gait, decreased activity tolerance, decreased balance, decreased endurance, decreased knowledge of condition, decreased knowledge of use of DME, decreased mobility, difficulty walking, decreased ROM, decreased strength, impaired flexibility, postural dysfunction, prosthetic dependency , and pain.   ACTIVITY LIMITATIONS: carrying, lifting, bending, sitting, standing, squatting,  stairs, transfers, bathing, toileting, dressing, reach over head, and locomotion level  PARTICIPATION LIMITATIONS: meal prep, cleaning, laundry, driving, shopping, community activity, and yard work  PERSONAL FACTORS: Age, Fitness, Past/current experiences, Time since onset of injury/illness/exacerbation, and 3+ comorbidities: see PMH are also affecting patient's functional outcome.   REHAB POTENTIAL: Good  CLINICAL DECISION MAKING: Evolving/moderate complexity  EVALUATION COMPLEXITY: Moderate   GOALS: Goals reviewed with patient? Yes  SHORT TERM GOALS: Target date: 04/05/2024  Patient donnes prosthesis modified independent & verbalizes proper cleaning. Baseline: SEE OBJECTIVE DATA Goal status: MET, 04/10/2024  2.  Patient tolerates prosthesis >12 hrs total /day without skin issues or limb pain >7/10 after standing. Baseline: patient reports wearing 4-5 hr/day Goal status: NOT  MET, 04/10/2024  3.  Patient able to reach 7 and look over both shoulders with RW support with Modified independent. Baseline: SEE OBJECTIVE DATA Goal status: MET, 04/10/2024  4. Patient ambulates 125' with RW & prosthesis with supervision. Baseline: SEE OBJECTIVE DATA Goal status: MET, 04/10/2024  5. Patient negotiates ramps & curbs with RW & prosthesis with supervision. Baseline: SEE OBJECTIVE DATA Goal status: MET, 04/10/2024  LONG TERM GOALS: Target date: 05/31/2024  Patient demonstrates & verbalized understanding of prosthetic care to enable safe utilization of prosthesis. Baseline: SEE OBJECTIVE DATA Goal status: Ongoing   03/20/2024  Patient tolerates prosthesis wear >90% of awake hours without skin issues or limb pain >4/10. Baseline: SEE OBJECTIVE DATA Goal status: Ongoing   03/20/2024  BERG >/= 25/56 to indicate lower fall risk Baseline: SEE OBJECTIVE DATA Goal status: Ongoing   03/20/2024  Patient ambulates >300' with prosthesis and LRAD independently Baseline: SEE OBJECTIVE DATA Goal status:  Ongoing   03/20/2024  Patient negotiates ramps, curbs & stairs with single rail with prosthesis and LRAD independently. Baseline: SEE OBJECTIVE DATA Goal status: Ongoing   03/20/2024  6.  Patient reports Patient-Specific Activity Score improved the average by 3 to indicate improvement in functional activities.   Baseline: SEE OBJECTIVE DATA Goal status: Ongoing   03/20/2024  PLAN:  PT FREQUENCY: 2x/week  PT DURATION: 12 weeks  PLANNED INTERVENTIONS: 97164- PT Re-evaluation, 97750- Physical Performance Testing, 97110-Therapeutic exercises, 97530- Therapeutic activity, 97112- Neuromuscular re-education, 97535- Self Care, 02859- Manual therapy, [phone]- Gait training, [phone]- Prosthetic Initial , [phone]- Orthotic/Prosthetic subsequent, Patient/Family education, Balance training, Stair training, Joint mobilization, and DME instructions  PLAN FOR NEXT SESSION: stairs, 4 square step test, static/dynamic balance test, gait with cane   Ismael Theophilus Stallion, SPT 04/16/24 3:41 PM   This entire session of physical therapy was performed under the direct supervision of PT signing evaluation /treatment. PT reviewed note and agrees.   Grayce Spatz, PT, DPT 04/16/2024, 4:14 PM   Date of referral: 02/13/2024 Referring provider: Harden Jerona GAILS, MD Referring diagnosis? S10.387 (ICD-10-CM) - Hx of AKA (above knee amputation), left (HCC)  Treatment diagnosis? (if different than referring diagnosis) R26.89, M62.81, M79.605, M25.652, R29.3, R26.81, L98.498  What was this (referring dx) caused by? Other: Lt AKA  Nature of Condition: Initial Onset (within last 3 months)   Laterality: Lt  Current Functional Measure Score: Patient Specific Functional Scale Score:4  Objective measurements identify impairments when they are compared to normal values, the uninvolved extremity, and prior level of function.  [x]  Yes  []  No  Objective assessment of functional ability: Moderate functional  limitations   Briefly describe symptoms: Patient is dependent in prosthetic care and use with high risk of falls. Gait deviation, muscle weakness, pain in Lt residual limb, hip flexion contracture.  How did symptoms start: amputation due to vascular issues  Average pain intensity:  Last 24 hours: 8-9/10  Past week: 8-9/10  How often does the pt experience symptoms? Constantly  How much have the symptoms interfered with usual daily activities? Quite a bit  How has condition changed since care began at this facility? NA - initial visit  In general, how is the patients overall health? Good   BACK PAIN (STarT Back Screening Tool) No

## 2024-04-18 NOTE — Therapy (Incomplete)
 OUTPATIENT PHYSICAL THERAPY PROSTHETIC TREATMENT/PROGRESS NOTE  Progress Note Reporting Period 03/05/2024 to 04/16/2024  See note below for Objective Data and Assessment of Progress/Goals.    Patient Name: Mike Floyd MRN: [id number] DOB:10-Aug-1960, 63 y.o., male Today's Date: 04/18/2024  END OF SESSION:      Past Medical History:  Diagnosis Date   AICD (automatic cardioverter/defibrillator) present 11/2007   Removed in 2018, a. 11/2007 [facility] Current VR - single lead ICD  - Removed 2018 - it was burning me   Anxiety    CAD (coronary artery disease)    non-obstructive CAD by Cor CT in 2020 // Myoview  3/22: EF 57, small inf-sept defect c/w scar, no ischemia; low risk      Chest pain    a. 10/2007 Cath:  normal Cors.   CKD (chronic kidney disease), stage II    DDD (degenerative disc disease), lumbar    Diabetes mellitus DX: 2010   type 2   Erosive esophagitis    a. per EGD (08/2011), Dr. Golda - Erosive reflux esophagitis improved but not completely healed since previous EGD 3 years ago. Bx showing  ulcerated gatroesophageal junction mucosa. negative for H. pylori   GERD (gastroesophageal reflux disease)    Gout    Hearing deficit    a. wear bilateral hearing aides   History of hiatal hernia    History of kidney stones    passed stones, no surgery   Hypertension    Mildly dilatd aortic root (HCC)    CMR 4/22: EF 52, no LGE; d/w Dr. Ione root 38 mm (mildly dilated)   Myocardial infarction [facility]) 2011   Neuropathy    Feet and legs   Nonischemic dilated cardiomyopathy (HCC)    a. H/O EF as low as 35-40% by LV gram 10/2007;  b. Echo 02/2011 EF 50-55%, inf HK, Gr 1 DD // CMR 4/22: EF 52, no LGE; d/w Dr. Ione root 38 mm (mildly dilated)    Renal insufficiency    Sleep apnea    pt doesnt use, statesI cant afford one. PCP aware   Stroke (HCC)    mini-stroke in 2014   TIA (transient ischemic attack)    July, 2013   Tobacco abuse, in remission 06/27/2009    Discontinued in 2009     Wears dentures    top plate   WPW (Wolff-Parkinson-White syndrome)    a. s/p RFCA @ [facility] - 1999   Past Surgical History:  Procedure Laterality Date   AMPUTATION Left 05/19/2022   Procedure: TRANSMETATARSAL AMPUTATION LEFT FOOT;  Surgeon: Harden Jerona GAILS, MD;  Location: [facility] OR;  Service: Orthopedics;  Laterality: Left;   AMPUTATION Left 06/17/2023   Procedure: LEFT BELOW KNEE AMPUTATION;  Surgeon: Harden Jerona GAILS, MD;  Location: [facility] OR;  Service: Orthopedics;  Laterality: Left;   AMPUTATION Left 08/19/2023   Procedure: LEFT ABOVE KNEE AMPUTATION;  Surgeon: Harden Jerona GAILS, MD;  Location: [facility] OR;  Service: Orthopedics;  Laterality: Left;   APPENDECTOMY     BACK SURGERY     1995   CARDIAC CATHETERIZATION     2009   CARDIAC DEFIBRILLATOR PLACEMENT     ICD was removed in 2018   CHOLECYSTECTOMY     CHOLECYSTECTOMY, LAPAROSCOPIC     11/2007   COLONOSCOPY W/ POLYPECTOMY  2009   ELBOW SURGERY Left 06/2010   ESOPHAGEAL DILATION N/A 11/08/2014   Procedure: ESOPHAGEAL DILATION;  Surgeon: Claudis RAYMOND Golda, MD;  Location: [facility] ORS;  Service: Endoscopy;  Laterality:  N/A;  #56,    ESOPHAGOGASTRODUODENOSCOPY  03/31/2012   also 08/2011; [name]   ESOPHAGOGASTRODUODENOSCOPY (EGD) WITH PROPOFOL  N/A 11/08/2014   Procedure: ESOPHAGOGASTRODUODENOSCOPY (EGD) WITH PROPOFOL ;  Surgeon: Claudis RAYMOND Rivet, MD;  Location: [facility] ORS;  Service: Endoscopy;  Laterality: N/A;  [place] is 62 , [place] is 37   FOOT ARTHRODESIS Left 10/24/2020   Procedure: LEFT GASTROCNEMIUS RECESSION, DORSIFLEXION OSTEOTOMY 1ST MT;  Surgeon: Harden Jerona GAILS, MD;  Location: [facility] OR;  Service: Orthopedics;  Laterality: Left;   FOOT ARTHRODESIS Right 01/09/2021   Procedure: CLOSING WEDGE OSTEOTOMY RIGHT 1ST METATARSAL;  Surgeon: Harden Jerona GAILS, MD;  Location: [facility] OR;  Service: Orthopedics;  Laterality: Right;   GASTROCNEMIUS RECESSION Right 01/09/2021   Procedure: RIGHT GASTROCNEMIUS RECESSION;  Surgeon: Harden Jerona GAILS, MD;   Location: [facility] OR;  Service: Orthopedics;  Laterality: Right;   ICD LEAD REMOVAL N/A 10/07/2016   Procedure: ICD LEAD REMOVAL ;  Surgeon: Danelle LELON Birmingham, MD;  Location: [facility] OR;  Service: Cardiovascular;  Laterality: N/A;   MULTIPLE TOOTH EXTRACTIONS     RADIOFREQUENCY ABLATION  1999   WPW; performed at [facility]   STUMP REVISION Left 07/22/2023   Procedure: REVISION LEFT BELOW KNEE AMPUTATION;  Surgeon: Harden Jerona GAILS, MD;  Location: [facility] OR;  Service: Orthopedics;  Laterality: Left;   TEE WITHOUT CARDIOVERSION N/A 10/07/2016   Procedure: TRANSESOPHAGEAL ECHOCARDIOGRAM (TEE);  Surgeon: Danelle LELON Birmingham, MD;  Location: [facility] OR;  Service: Cardiovascular;  Laterality: N/A;   Patient Active Problem List   Diagnosis Date Noted   Dyslipidemia 11/03/2023   Essential hypertension 11/03/2023   Type 2 diabetes mellitus with peripheral neuropathy (HCC) 11/03/2023   BPH (benign prostatic hyperplasia) 11/03/2023   GERD without esophagitis 11/03/2023   S/P AKA (above knee amputation) unilateral, left (HCC) 08/19/2023   Dehiscence of amputation stump of left lower extremity (HCC) 07/22/2023   Ischemia of site of left below knee amputation (HCC) 07/22/2023   S/P BKA (below knee amputation) unilateral, left (HCC) 07/22/2023   Below-knee amputation of left lower extremity (HCC) 06/17/2023   Acute osteomyelitis of metatarsal bone of left foot (HCC)    Subacute osteomyelitis, left ankle and foot (HCC) 05/13/2022   Non-pressure chronic ulcer of other part of right foot limited to breakdown of skin (HCC)    Contracture of right Achilles tendon    Mildly dilatd aortic root (HCC)    Contracture of left Achilles tendon    Metatarsal deformity, left    CAD (coronary artery disease)    Hallux hammertoe, left 12/11/2019   Diarrhea 06/27/2019   Diastasis recti 08/22/2018   Umbilical hernia without obstruction and without gangrene 08/22/2018   Vitamin D  deficiency 06/23/2017   Essential hypertension, benign  06/15/2017   Mixed hyperlipidemia 06/15/2017   Class 1 obesity due to excess calories with serious comorbidity and body mass index (BMI) of 31.0 to 31.9 in adult 06/15/2017   Malfunction of implantable cardioverter-defibrillator (ICD) electrode 10/07/2016   Chronic kidney disease (CKD), stage III (moderate) (HCC) 04/04/2015   Bladder neck obstruction 03/21/2015   Difficult or painful urination 03/21/2015   Flank pain 03/21/2015   Delayed onset of urination 03/21/2015   Calculus of kidney 03/21/2015   Erosive esophagitis 12/27/2013   Hypotension due to drugs 07/02/2013   Chronic systolic heart failure (HCC) 04/24/2013   CAP (community acquired pneumonia) 03/16/2013   HTN (hypertension) 03/15/2013   Hydrocele 03/15/2013   Spermatocele 03/15/2013   DOE (dyspnea on exertion) 03/15/2013   OSA (  obstructive sleep apnea) 01/09/2013   Chronic kidney disease, stage 3 (HCC) 01/03/2013   Chest pain 12/26/2012   History of diagnostic tests 09/06/2012   TIA (transient ischemic attack)    Cardiomyopathy, nonischemic (HCC) 02/24/2010   AICD (automatic cardioverter/defibrillator) 02/24/2010   DM type 2 causing vascular disease (HCC) 06/27/2009   Gout 06/27/2009   Tobacco abuse, in remission 06/27/2009   COLONIC POLYPS 10/31/2008   GERD (gastroesophageal reflux disease) 10/31/2008    PCP: Bertell Satterfield, MD  REFERRING PROVIDER: Bertell Satterfield, MD  REFERRING DIAG: [phone] (ICD-10-CM) - Hx of AKA (above knee amputation), left (HCC)   THERAPY DIAG:  No diagnosis found.  Rationale for Evaluation and Treatment: Rehabilitation  ONSET DATE: Prosthesis delivery 02/20/2024  SUBJECTIVE:   SUBJECTIVE STATEMENT:  *** Patient has been having personal problems which have made him walk longer distances and done less HEP. Patient is fatigued coming into PT session  PERTINENT HISTORY: DM2, GERD, hypertension, dyslipidemia, CAD, L BKA (06/17/23), neuropathy  Eval: Patient is a 63 year old male who  presents to physical therapy with a left AKA (08/19/2023) with a new prosthesis delivery on 02/20/2024. Patient had previous home health PT prior to Lt AKA. He is able to mobilize with modified independence w/ walker. Wears prosthesis about ten hours a day and by the end of the night he takes it off due to fatigue and pain.   PAIN:  Are you having pain? Yes: NPRS scale: 3/10 at rest, worst up to 5/10 Pain location: Femur in prosthesis and pain in the Rt leg as well Pain description: feels like its been hammered Aggravating factors: keeping prosthesis on for long periods of times, sitting Relieving factors: taking off prosthesis and medications  PRECAUTIONS: Fall  RED FLAGS: None   WEIGHT BEARING RESTRICTIONS: No  FALLS:  Has patient fallen in last 6 months? Yes. Number of falls 2, forgetting he doesn't have a leg and put weight onto the left side. He did not hit the floor, only fell into another person. Occurred when transferring from sitting to standing  LIVING ENVIRONMENT: Lives with: lives with their spouse Lives in: Other Trailer on grass Stairs: Yes: External: 4 steps; can reach both Has following equipment at home: Single point cane, Environmental consultant - 2 wheeled, Wheelchair (manual), and shower chair  OCCUPATION: retired   PLOF: Independent with household mobility with device  PATIENT GOALS: ambulating and ADLs with cane   NEXT MD VISIT: Office MD visit: 03/21/2024   OBJECTIVE:  Patient-Specific Activity Scoring Scheme  0 represents "unable to perform." 10 represents "able to perform at prior level. 0 1 2 3 4 5 6 7 8 9  10 (Date and Score)   Activity Eval     1. Walking w/ cane  2    2. Standing for ADLs  5    3. Stairs 5   4.    5.    Score 4    Total score = sum of the activity scores/number of activities Minimum detectable change (90%CI) for average score = 2 points Minimum detectable change (90%CI) for single activity score = 3 points  DIAGNOSTIC FINDINGS: none  post-amputation  COGNITION: Overall cognitive status: Within functional limits for tasks assessed   SENSATION: WFL  MUSCLE LENGTH:   POSTURE: rounded shoulders, forward head, flexed trunk , and weight shift right  LOWER EXTREMITY ROM:  ROM P:passive  A:active Left eval  Hip flexion   Hip extension Thomas position: P: - 40*  Hip abduction   Hip adduction  Hip internal rotation   Hip external rotation   Knee flexion   Knee extension   Ankle dorsiflexion   Ankle plantarflexion   Ankle inversion   Ankle eversion    (Blank rows = not tested)  LOWER EXTREMITY MMT:  MMT Right eval Left eval  Hip flexion 3/5 3/5  Hip extension 3/5 3-/5  Hip abduction  3-/5  Hip adduction    Hip internal rotation    Hip external rotation    Knee flexion    Knee extension 3/5   Ankle dorsiflexion    Ankle plantarflexion    Ankle inversion    Ankle eversion    At Evaluation all strength testing is grossly seated and functionally standing / gait. (Blank rows = not tested)  BED MOBILITY:  Sit to supine Complete Independence Supine to sit Complete Independence Rolling to Right Complete Independence  TRANSFERS: 04/10/2024: Pt able stand to/from sit into chair without armrest using BUEs on seat and RW for stabilization with supervision/verbal cues.  Eval: Sit to stand: supervision requires a stable object to arise, stabilization w/ walker using UE support, with delayed weight shift onto the prosthesis for stabilization, and uses UE to bring Lt prosthesis back below his body  Stand to sit: supervision requires a stable object to arise, stabilization w/ walker using UE support, with delayed weight shift onto the prosthesis for stabilization  FUNCTIONAL TESTs:  04/10/2024: Patient able to reach 10 forward and look over his shoulders with single upper extremity support on RW.   Eval:  BERG score 11/56  GAIT: 04/10/2024: Patient ambulated 125' with RW and supervision.   Patient  negotiated 12*ramp and 6.5 curb with RW and supervision  Eval: Gait pattern: step to pattern, decreased step length- Right, decreased stance time- Left, decreased hip/knee flexion- Left, circumduction- Left, Left hip hike, antalgic, trendelenburg, lateral lean- Right, trunk flexed, abducted- Left, and poor foot clearance- Left Distance walked: 62 ft Assistive device utilized: Walker - 2 wheeled and AKA prosthesis Level of assistance: SBA / steady with RW support but needs cues  Gait velocity: 0.72 ft/sec Comments: excessive weightbearing on BUEs using RW  RAMP : not tested  CURB: not tested  CURRENT PROSTHETIC WEAR ASSESSMENT:   Patient is dependent with: skin check, residual limb care, care of non-amputated limb, prosthetic cleaning, ply sock cleaning, correct ply sock adjustment, proper wear schedule/adjustment, and proper weight-bearing schedule/adjustment Donning prosthesis: SBA / verbal cues Doffing prosthesis: Complete Independence Prosthetic wear tolerance: 6-8 hours, 1x/day, 7 days/week Prosthetic weight bearing tolerance: tolerated standing for 10 minutes with partial weight on prosthesis during BERG balance test with no increased pain. Although he reports standing pain with activities at home. Edema: minimal Residual limb condition: 3x85mm superficial wound distally located, scar with mild adherence, no adductor roll, normal skin color, decreased hair growth present Prosthetic description: silicone liner with suction ring suspension, ischial containment socket with flexible inner socket, rotator unit, SAFETY (single axis friction engaging with an extension assist), flexible keel foot  TODAY'S TREATMENT:  DATE: 04/19/2024 ***   TODAY'S TREATMENT:                                                                                                                    DATE:  04/16/2024 Prosthetic Training with Transfemoral Prosthesis  PT verbally educated patient about using ply socks appropriately including donning. Trialed different ply sock sizes and ambulated to feel appropriate fit.  Patient verbalized and demoed understanding Patient ambulated 11' with cane and CGA and performed a stand to sit into chair with no armrests.  Patient fatigued after exercise Patient ambulated 25' with cane and CGA.  Patient had a loss of balance which required maxA x2 to recover from to prevent a fall.  Patient ambulated 83' with cane and CGA.  Minimal loss of balance when transferring from cane to RW  Neuro Re-Ed Following exercises were done with blue Thera-Band to challenge standing balance Single arm row with each arm independently and bilateral rows, 5x Single arm forward punch with each arm independently and bilateral punches, 5x Single bicep curl plus shoulder flexion with each arm independently and bilateral bicep curl plus shoulder flexion, 5x   TREATMENT:                                                                                                                             DATE: 04/12/2024 Prosthetic Training with Transfemoral Prosthesis  PT discussing wear time with transfemoral prosthesis. Patient noting increasing time to 2x6 hours with 2 hour break as of yesterday, but described day as more of 8 hours on, 30 minutes off, and 8 hours on before removing prosthesis for the night. Patient does endorse removing prosthesis for about 2 hours occasionally, however, does not like to sit still for long periods of time and does not want to perform mobility with RW without prosthesis. PT reminding patient to give himself enough breaks built in for sweat checks, drying residual limb and prosthesis, and for no overuse. Patient endorsing completing dry time breaks within his day for about 5-10 minutes a day.  PT discussed ply socks with patient and their importance especially over the  next few months before he receives a new socket as his residual limb is going to change shape and the ply socks will assist with appropriate fit. Patient endorses returning to clinic after session to make next appointment. PT reminded patient to bring ply socks to next session if he were to receive them while visiting the  clinic to make his next appointment with the prosthetist. Reassessed height of RW as patient ambulated with increased trunk flexion and thoracic kyphosis at beginning of session. PT increased height of RW to appropriate height by two settings in order to decrease postural compensations; patient performing with improved posture after adjustments. Patient performing several sit<>stands from standard arm chair with close SBA with intermittent difficulty bending prosthesis when sitting. No required increased physical assist from PT or uncontrolled movements. Patient performing household distance ambulation, ~2x30', with RW and close SBA with noted very large Lt step lengths. Patient will benefit from gait training to equalize step lengths and stance time.  Neuro Re-Ed:  Patient performing multiple balance activities this date with CGA-minA while standing in a corner and having a chair in front of him for safety and confidence.  2x30s with narrow BOS; increased sway noted but no need for increased physical assistance from PT  2x30s with semi tandem stance; increased sway noted with minimal instances of posterior LOB; patient able to catch himself on wall and self-correct back into starting stance  2x30s with narrow BOS and horizontal head turns; patient requiring increased assist to minA for lateral and posterior LOB that was worse with Lt head turn  2x30s with narrow BOS and vertical head turns; patient experiencing increased sway and posterior LOB with patient able to catch on wall and self-correct back into starting stance  1x30s with narrow BOS and eyes closed; patient experiencing fear  and few instances of LOB (posterior, anterior, and lateral) with only one instance of requiring increased physical assist from PT to return to starting stance PT discussed with patient about not beginning these exercises at home yet due to busy home life and plenty of exercises already on HEP, but to expect that they will eventually be added.   TREATMENT:                                                                                                                             DATE: 04/10/2024 Prosthetic Training with Transfemoral Prosthesis  PT instructed patient to wear prosthesis 4-5 hours/2x a day with a 2 hour break with goal to build to wear most of his awake hours.  PT instructed patient that increasing wear increases his ability to improve overall function throughout his day.  Taking the mid break typically increases wear time without increasing limb pain or skin issues.  Patient and wife verbalized understanding. PT verbally educated patient about the use of ply socks.  Patient reports she did not receive any socks when he received his prosthesis.  PT recommended asking prosthetist at his next visit.  Patient verbalized understanding PT cued patient on proper foot position to facilitate prosthetic knee stability/extension in stance for standing ADLs.  Patient able to reach 10 forward and look over his shoulders with single upper extremity support on RW.  Patient ambulated 125' with RW and supervision.  Performed the stand to sit into chair  without armrest using BUEs on seat with supervision/verbal cues. Patient negotiated 12*ramp and 6.5 curb with RW and supervision PT verbally educated and demo how to negotiate stairs with step-to pattern, cane, and using single rail.  Patient able to negotiate 3 steps with right rail/LUE cane and 3 steps with left rail/RUE cane with min/CGA.  PT also instructed in moving walker prior to performing stairs when going out of the community with family.  Patient  demoed and verbalized understanding. Patient is unsteady during activity.     PATIENT EDUCATION: PATIENT EDUCATED ON FOLLOWING PROSTHETIC CARE: Education details: Sitting positioning to limit skin traction from weight of prosthesis, how hip flexion contracture effects residual limb pain & posture,  Prosthetic cleaning and Propper donning  Person educated: Patient, Spouse, and close family friend Education method: Explanation, Demonstration, Tactile cues, and Verbal cues Education comprehension: verbalized understanding and needs further education  HOME EXERCISE PROGRAM: Access Code: [email or web address] URL: [email or web address] Date: 03/20/2024 Prepared by: Grayce Spatz  Exercises - Modified Thomas Stretch  - 2 x daily - 7 x weekly - 1 sets - 2-3 reps - 30 seconds hold - Hip Flexor Stretch at Edge of Bed  - 2 x daily - 7 x weekly - 1 sets - 2-3 reps - 30 seconds hold - Standing posture with back to counter  - 2 x daily - 7 x weekly - 1 sets - 1 reps - 5-10 minutes hold - Upright Stance at Door Frame Single Arm  - 3-6 x daily - 7 x weekly - 1 sets - 2 reps - 2 deep breathes hold - Upright Stance at Door Frame with Both Arms  - 3-6 x daily - 7 x weekly - 1 sets - 2 reps - 2 deep breathes hold  Do each exercise 1-2  times per day Do each exercise 5-10 repetitions Hold each exercise for 2 seconds to feel your location  AT SINK FIND YOUR MIDLINE POSITION AND PLACE FEET EQUAL DISTANCE FROM THE MIDLINE.  Try to find this position when standing still for activities.   USE TAPE ON FLOOR TO MARK THE MIDLINE POSITION which is even with middle of sink.  You also should try to feel with your limb pressure in socket.  You are trying to feel with limb what you used to feel with the bottom of your foot.  Side to Side Shift: Moving your hips only (not shoulders): move weight onto your left leg, HOLD/FEEL pressure in socket.  Move back to equal weight on each leg, HOLD/FEEL pressure in socket.  Move weight onto your right leg, HOLD/FEEL pressure in socket. Move back to equal weight on each leg, HOLD/FEEL pressure in socket. Repeat.  Start with both hands on sink, progress to hand on prosthetic side only, then no hands.  Front to Back Shift: Moving your hips only (not shoulders): move your weight forward onto your toes, HOLD/FEEL pressure in socket. Move your weight back to equal Flat Foot on both legs, HOLD/FEEL  pressure in socket. Move your weight back onto your heels, HOLD/FEEL  pressure in socket. Move your weight back to equal on both legs, HOLD/FEEL  pressure in socket. Repeat.  Start with both hands on sink, progress to hand on prosthetic side only, then no hands.  Moving Cones / Cups: With equal weight on each leg: Hold on with one hand the first time, then progress to no hand supports. Move cups from one side of sink to the other. Place cups ~2" out of  your reach, progress to 10" beyond reach.  Place one hand in middle of sink and reach with other hand. Do both arms.  Then hover one hand and move cups with other hand.  Overhead/Upward Reaching: alternated reaching up to top cabinets or ceiling if no cabinets present. Keep equal weight on each leg. Start with one hand support on counter while other hand reaches and progress to no hand support with reaching.  ace one hand in middle of sink and reach with other hand. Do both arms.  Then hover one hand and move cups with other hand.  5.   Looking Over Shoulders: With equal weight on each leg: alternate turning to look over your shoulders with one hand support on counter as needed.  Start with head motions only to look in front of shoulder, then even with shoulder and progress to looking behind you. To look to side, move head /eyes, then shoulder on side looking pulls back, shift more weight to side looking and pull hip back. Place one hand in middle of sink and let go with other hand so your shoulder can pull back. Switch hands to look other way.    Then hover one hand and look over shoulder. If looking right, use left hand at sink. If looking left, use right hand at sink. 6.  Stepping with leg that is not amputated:  Move items under cabinet out of your way. Shift your hips/pelvis so weight on prosthesis. Tighten muscles in hip on prosthetic side.  SLOWLY step other leg so front of foot is in cabinet. Then step back to floor.    ASSESSMENT:  CLINICAL IMPRESSION:  ***  Patient arrived to session with fatigue due to ambulating longer distances throughout the weekend. Patient is gaining confidence and stability during gait with SPC. LOB today likely due to overall fatigue. He no longer requires cues to keep consistent step width during gait and his pain in the lateral portion of his residual limb has greatly minimized. Patient demonstrated overall good standing balance control with new standing activity and is able to self-correct minimal sways. Patient will continue to benefit from skilled PT to address impairments and gain independence.   OBJECTIVE IMPAIRMENTS: Abnormal gait, decreased activity tolerance, decreased balance, decreased endurance, decreased knowledge of condition, decreased knowledge of use of DME, decreased mobility, difficulty walking, decreased ROM, decreased strength, impaired flexibility, postural dysfunction, prosthetic dependency , and pain.   ACTIVITY LIMITATIONS: carrying, lifting, bending, sitting, standing, squatting, stairs, transfers, bathing, toileting, dressing, reach over head, and locomotion level  PARTICIPATION LIMITATIONS: meal prep, cleaning, laundry, driving, shopping, community activity, and yard work  PERSONAL FACTORS: Age, Fitness, Past/current experiences, Time since onset of injury/illness/exacerbation, and 3+ comorbidities: see PMH are also affecting patient's functional outcome.   REHAB POTENTIAL: Good  CLINICAL DECISION MAKING: Evolving/moderate complexity  EVALUATION COMPLEXITY:  Moderate   GOALS: Goals reviewed with patient? Yes  SHORT TERM GOALS: Target date: 04/05/2024  Patient donnes prosthesis modified independent & verbalizes proper cleaning. Baseline: SEE OBJECTIVE DATA Goal status: MET, 04/10/2024  2.  Patient tolerates prosthesis >12 hrs total /day without skin issues or limb pain >7/10 after standing. Baseline: patient reports wearing 4-5 hr/day Goal status: NOT MET, 04/10/2024  3.  Patient able to reach 7 and look over both shoulders with RW support with Modified independent. Baseline: SEE OBJECTIVE DATA Goal status: MET, 04/10/2024  4. Patient ambulates 125' with RW & prosthesis with supervision. Baseline: SEE OBJECTIVE DATA Goal status: MET, 04/10/2024  5.  Patient negotiates ramps & curbs with RW & prosthesis with supervision. Baseline: SEE OBJECTIVE DATA Goal status: MET, 04/10/2024  LONG TERM GOALS: Target date: 05/31/2024  Patient demonstrates & verbalized understanding of prosthetic care to enable safe utilization of prosthesis. Baseline: SEE OBJECTIVE DATA Goal status: Ongoing   03/20/2024  Patient tolerates prosthesis wear >90% of awake hours without skin issues or limb pain >4/10. Baseline: SEE OBJECTIVE DATA Goal status: Ongoing   03/20/2024  BERG >/= 25/56 to indicate lower fall risk Baseline: SEE OBJECTIVE DATA Goal status: Ongoing   03/20/2024  Patient ambulates >300' with prosthesis and LRAD independently Baseline: SEE OBJECTIVE DATA Goal status: Ongoing   03/20/2024  Patient negotiates ramps, curbs & stairs with single rail with prosthesis and LRAD independently. Baseline: SEE OBJECTIVE DATA Goal status: Ongoing   03/20/2024  6.  Patient reports Patient-Specific Activity Score improved the average by 3 to indicate improvement in functional activities.   Baseline: SEE OBJECTIVE DATA Goal status: Ongoing   03/20/2024  PLAN:  PT FREQUENCY: 2x/week  PT DURATION: 12 weeks  PLANNED INTERVENTIONS: 97164- PT Re-evaluation, 97750-  Physical Performance Testing, 97110-Therapeutic exercises, 97530- Therapeutic activity, 97112- Neuromuscular re-education, 97535- Self Care, 02859- Manual therapy, [phone]- Gait training, [phone]- Prosthetic Initial , [phone]- Orthotic/Prosthetic subsequent, Patient/Family education, Balance training, Stair training, Joint mobilization, and DME instructions  PLAN FOR NEXT SESSION: *** stairs, 4 square step test, static/dynamic balance test, gait with cane   Susannah Daring, PT, DPT 04/18/24 12:55 PM    Date of referral: 02/13/2024 Referring provider: Bertell Satterfield, MD Referring diagnosis? S10.387 (ICD-10-CM) - Hx of AKA (above knee amputation), left (HCC)  Treatment diagnosis? (if different than referring diagnosis) R26.89, M62.81, M79.605, M25.652, R29.3, R26.81, L98.498  What was this (referring dx) caused by? Other: Lt AKA  Nature of Condition: Initial Onset (within last 3 months)   Laterality: Lt  Current Functional Measure Score: Patient Specific Functional Scale Score:4  Objective measurements identify impairments when they are compared to normal values, the uninvolved extremity, and prior level of function.  [x]  Yes  []  No  Objective assessment of functional ability: Moderate functional limitations   Briefly describe symptoms: Patient is dependent in prosthetic care and use with high risk of falls. Gait deviation, muscle weakness, pain in Lt residual limb, hip flexion contracture.  How did symptoms start: amputation due to vascular issues  Average pain intensity:  Last 24 hours: 8-9/10  Past week: 8-9/10  How often does the pt experience symptoms? Constantly  How much have the symptoms interfered with usual daily activities? Quite a bit  How has condition changed since care began at this facility? NA - initial visit  In general, how is the patients overall health? Good   BACK PAIN (STarT Back Screening Tool) No

## 2024-04-19 ENCOUNTER — Encounter

## 2024-04-20 ENCOUNTER — Other Ambulatory Visit: Payer: Self-pay | Admitting: Student

## 2024-04-23 ENCOUNTER — Ambulatory Visit: Admitting: Physical Therapy

## 2024-04-23 ENCOUNTER — Encounter: Payer: Self-pay | Admitting: Physical Therapy

## 2024-04-23 DIAGNOSIS — M6281 Muscle weakness (generalized): Secondary | ICD-10-CM

## 2024-04-23 DIAGNOSIS — R2689 Other abnormalities of gait and mobility: Secondary | ICD-10-CM

## 2024-04-23 DIAGNOSIS — M25652 Stiffness of left hip, not elsewhere classified: Secondary | ICD-10-CM

## 2024-04-23 DIAGNOSIS — R293 Abnormal posture: Secondary | ICD-10-CM

## 2024-04-23 DIAGNOSIS — R2681 Unsteadiness on feet: Secondary | ICD-10-CM

## 2024-04-23 NOTE — Therapy (Addendum)
 OUTPATIENT PHYSICAL THERAPY PROSTHETIC TREATMENT   Patient Name: Mike Floyd MRN: [id number] DOB:April 10, 1961, 63 y.o., male Today's Date: 04/23/2024  END OF SESSION:  PT End of Session - 04/23/24 1432     Visit Number 11    Number of Visits 25    Date for PT Re-Evaluation 05/31/24    Authorization Type [employer] MEDICARE    Authorization Time Period $45 copay    Progress Note Due on Visit 20    PT Start Time 1433    PT Stop Time 1515    PT Time Calculation (min) 42 min    Equipment Utilized During Treatment Gait belt    Activity Tolerance Patient tolerated treatment well;Patient limited by fatigue    Behavior During Therapy WFL for tasks assessed/performed             Past Medical History:  Diagnosis Date   AICD (automatic cardioverter/defibrillator) present 11/2007   Removed in 2018, a. 11/2007 S[facility] Current VR - single lead ICD  - Removed 2018 - it was burning me   Anxiety    CAD (coronary artery disease)    non-obstructive CAD by Cor CT in 2020 // Myoview  3/22: EF 57, small inf-sept defect c/w scar, no ischemia; low risk      Chest pain    a. 10/2007 Cath:  normal Cors.   CKD (chronic kidney disease), stage II    DDD (degenerative disc disease), lumbar    Diabetes mellitus DX: 2010   type 2   Erosive esophagitis    a. per EGD (08/2011), Dr. Golda - Erosive reflux esophagitis improved but not completely healed since previous EGD 3 years ago. Bx showing  ulcerated gatroesophageal junction mucosa. negative for H. pylori   GERD (gastroesophageal reflux disease)    Gout    Hearing deficit    a. wear bilateral hearing aides   History of hiatal hernia    History of kidney stones    passed stones, no surgery   Hypertension    Mildly dilatd aortic root (HCC)    CMR 4/22: EF 52, no LGE; d/w Dr. Ione root 38 mm (mildly dilated)   Myocardial infarction [facility]) 2011   Neuropathy    Feet and legs   Nonischemic dilated cardiomyopathy (HCC)    a. H/O EF as low as 35-40% by  LV gram 10/2007;  b. Echo 02/2011 EF 50-55%, inf HK, Gr 1 DD // CMR 4/22: EF 52, no LGE; d/w Dr. Ione root 38 mm (mildly dilated)    Renal insufficiency    Sleep apnea    pt doesnt use, statesI cant afford one. PCP aware   Stroke (HCC)    mini-stroke in 2014   TIA (transient ischemic attack)    July, 2013   Tobacco abuse, in remission 06/27/2009   Discontinued in 2009     Wears dentures    top plate   WPW (Wolff-Parkinson-White syndrome)    a. s/p RFCA @ [facility] - 1999   Past Surgical History:  Procedure Laterality Date   AMPUTATION Left 05/19/2022   Procedure: TRANSMETATARSAL AMPUTATION LEFT FOOT;  Surgeon: Harden Jerona GAILS, MD;  Location: [facility] OR;  Service: Orthopedics;  Laterality: Left;   AMPUTATION Left 06/17/2023   Procedure: LEFT BELOW KNEE AMPUTATION;  Surgeon: Harden Jerona GAILS, MD;  Location: [facility] OR;  Service: Orthopedics;  Laterality: Left;   AMPUTATION Left 08/19/2023   Procedure: LEFT ABOVE KNEE AMPUTATION;  Surgeon: Harden Jerona GAILS, MD;  Location: [facility] OR;  Service:  Orthopedics;  Laterality: Left;   APPENDECTOMY     BACK SURGERY     1995   CARDIAC CATHETERIZATION     2009   CARDIAC DEFIBRILLATOR PLACEMENT     ICD was removed in 2018   CHOLECYSTECTOMY     CHOLECYSTECTOMY, LAPAROSCOPIC     11/2007   COLONOSCOPY W/ POLYPECTOMY  2009   ELBOW SURGERY Left 06/2010   ESOPHAGEAL DILATION N/A 11/08/2014   Procedure: ESOPHAGEAL DILATION;  Surgeon: Claudis RAYMOND Rivet, MD;  Location: [facility] ORS;  Service: Endoscopy;  Laterality: N/A;  #56,    ESOPHAGOGASTRODUODENOSCOPY  03/31/2012   also 08/2011; [name]   ESOPHAGOGASTRODUODENOSCOPY (EGD) WITH PROPOFOL  N/A 11/08/2014   Procedure: ESOPHAGOGASTRODUODENOSCOPY (EGD) WITH PROPOFOL ;  Surgeon: Claudis RAYMOND Rivet, MD;  Location: [facility] ORS;  Service: Endoscopy;  Laterality: N/A;  Hiatus is 33 , GE [place]unction is 37   FOOT ARTHRODESIS Left 10/24/2020   Procedure: LEFT GASTROCNEMIUS RECESSION, DORSIFLEXION OSTEOTOMY 1ST MT;  Surgeon: Harden Jerona GAILS, MD;   Location: [facility] OR;  Service: Orthopedics;  Laterality: Left;   FOOT ARTHRODESIS Right 01/09/2021   Procedure: CLOSING WEDGE OSTEOTOMY RIGHT 1ST METATARSAL;  Surgeon: Harden Jerona GAILS, MD;  Location: [facility] OR;  Service: Orthopedics;  Laterality: Right;   GASTROCNEMIUS RECESSION Right 01/09/2021   Procedure: RIGHT GASTROCNEMIUS RECESSION;  Surgeon: Harden Jerona GAILS, MD;  Location: [facility] OR;  Service: Orthopedics;  Laterality: Right;   ICD LEAD REMOVAL N/A 10/07/2016   Procedure: ICD LEAD REMOVAL ;  Surgeon: Danelle LELON Birmingham, MD;  Location: [facility] OR;  Service: Cardiovascular;  Laterality: N/A;   MULTIPLE TOOTH EXTRACTIONS     RADIOFREQUENCY ABLATION  1999   WPW; performed at [facility]   STUMP REVISION Left 07/22/2023   Procedure: REVISION LEFT BELOW KNEE AMPUTATION;  Surgeon: Harden Jerona GAILS, MD;  Location: [facility] OR;  Service: Orthopedics;  Laterality: Left;   TEE WITHOUT CARDIOVERSION N/A 10/07/2016   Procedure: TRANSESOPHAGEAL ECHOCARDIOGRAM (TEE);  Surgeon: Danelle LELON Birmingham, MD;  Location: [facility] OR;  Service: Cardiovascular;  Laterality: N/A;   Patient Active Problem List   Diagnosis Date Noted   Dyslipidemia 11/03/2023   Essential hypertension 11/03/2023   Type 2 diabetes mellitus with peripheral neuropathy (HCC) 11/03/2023   BPH (benign prostatic hyperplasia) 11/03/2023   GERD without esophagitis 11/03/2023   S/P AKA (above knee amputation) unilateral, left (HCC) 08/19/2023   Dehiscence of amputation stump of left lower extremity (HCC) 07/22/2023   Ischemia of site of left below knee amputation (HCC) 07/22/2023   S/P BKA (below knee amputation) unilateral, left (HCC) 07/22/2023   Below-knee amputation of left lower extremity (HCC) 06/17/2023   Acute osteomyelitis of metatarsal bone of left foot (HCC)    Subacute osteomyelitis, left ankle and foot (HCC) 05/13/2022   Non-pressure chronic ulcer of other part of right foot limited to breakdown of skin (HCC)    Contracture of right Achilles tendon    Mildly  dilatd aortic root (HCC)    Contracture of left Achilles tendon    Metatarsal deformity, left    CAD (coronary artery disease)    Hallux hammertoe, left 12/11/2019   Diarrhea 06/27/2019   Diastasis recti 08/22/2018   Umbilical hernia without obstruction and without gangrene 08/22/2018   Vitamin D  deficiency 06/23/2017   Essential hypertension, benign 06/15/2017   Mixed hyperlipidemia 06/15/2017   Class 1 obesity due to excess calories with serious comorbidity and body mass index (BMI) of 31.0 to 31.9 in adult 06/15/2017   Malfunction of implantable cardioverter-defibrillator (ICD)  electrode 10/07/2016   Chronic kidney disease (CKD), stage III (moderate) (HCC) 04/04/2015   Bladder neck obstruction 03/21/2015   Difficult or painful urination 03/21/2015   Flank pain 03/21/2015   Delayed onset of urination 03/21/2015   Calculus of kidney 03/21/2015   Erosive esophagitis 12/27/2013   Hypotension due to drugs 07/02/2013   Chronic systolic heart failure (HCC) 04/24/2013   CAP (community acquired pneumonia) 03/16/2013   HTN (hypertension) 03/15/2013   Hydrocele 03/15/2013   Spermatocele 03/15/2013   DOE (dyspnea on exertion) 03/15/2013   OSA (obstructive sleep apnea) 01/09/2013   Chronic kidney disease, stage 3 (HCC) 01/03/2013   Chest pain 12/26/2012   History of diagnostic tests 09/06/2012   TIA (transient ischemic attack)    Cardiomyopathy, nonischemic (HCC) 02/24/2010   AICD (automatic cardioverter/defibrillator) 02/24/2010   DM type 2 causing vascular disease (HCC) 06/27/2009   Gout 06/27/2009   Tobacco abuse, in remission 06/27/2009   COLONIC POLYPS 10/31/2008   GERD (gastroesophageal reflux disease) 10/31/2008    PCP: Bertell Satterfield, MD  REFERRING PROVIDER: Harden Jerona GAILS, MD  REFERRING DIAG: [phone] (ICD-10-CM) - Hx of AKA (above knee amputation), left (HCC)   THERAPY DIAG:  Other abnormalities of gait and mobility  Muscle weakness (generalized)  Stiffness of left  hip, not elsewhere classified  Abnormal posture  Unsteadiness on feet  Rationale for Evaluation and Treatment: Rehabilitation  ONSET DATE: Prosthesis delivery 02/20/2024  SUBJECTIVE:   SUBJECTIVE STATEMENT:  Patient has been fatigued. Patient has been having anterior/lateral pain in his residual limb.  His wife was able to be discharged from hospital which was making him do a lot of walking and activities.   PERTINENT HISTORY: DM2, GERD, hypertension, dyslipidemia, CAD, L BKA (06/17/23), neuropathy  Eval: Patient is a 63 year old male who presents to physical therapy with a left AKA (08/19/2023) with a new prosthesis delivery on 02/20/2024. Patient had previous home health PT prior to Lt AKA. He is able to mobilize with modified independence w/ walker. Wears prosthesis about ten hours a day and by the end of the night he takes it off due to fatigue and pain.   PAIN:  Are you having pain? Yes: NPRS scale: 3/10 at rest, worst up to 5/10 Pain location: Femur in prosthesis and pain in the Rt leg as well Pain description: feels like its been hammered Aggravating factors: keeping prosthesis on for long periods of times, sitting Relieving factors: taking off prosthesis and medications  PRECAUTIONS: Fall  RED FLAGS: None   WEIGHT BEARING RESTRICTIONS: No  FALLS:  Has patient fallen in last 6 months? Yes. Number of falls 2, forgetting he doesn't have a leg and put weight onto the left side. He did not hit the floor, only fell into another person. Occurred when transferring from sitting to standing  LIVING ENVIRONMENT: Lives with: lives with their spouse Lives in: Other Trailer on grass Stairs: Yes: External: 4 steps; can reach both Has following equipment at home: Single point cane, Environmental consultant - 2 wheeled, Wheelchair (manual), and shower chair  OCCUPATION: retired   PLOF: Independent with household mobility with device  PATIENT GOALS: ambulating and ADLs with cane   NEXT MD VISIT:  Office MD visit: 03/21/2024   OBJECTIVE:  Patient-Specific Activity Scoring Scheme  0 represents "unable to perform." 10 represents "able to perform at prior level. 0 1 2 3 4 5 6 7 8 9  10 (Date and Score)   Activity Eval     1. Walking w/ cane  2  2. Standing for ADLs  5    3. Stairs 5   4.    5.    Score 4    Total score = sum of the activity scores/number of activities Minimum detectable change (90%CI) for average score = 2 points Minimum detectable change (90%CI) for single activity score = 3 points  DIAGNOSTIC FINDINGS: none post-amputation  COGNITION: Overall cognitive status: Within functional limits for tasks assessed   SENSATION: WFL  MUSCLE LENGTH:   POSTURE: rounded shoulders, forward head, flexed trunk , and weight shift right  LOWER EXTREMITY ROM:  ROM P:passive  A:active Left eval  Hip flexion   Hip extension Thomas position: P: - 40*  Hip abduction   Hip adduction   Hip internal rotation   Hip external rotation   Knee flexion   Knee extension   Ankle dorsiflexion   Ankle plantarflexion   Ankle inversion   Ankle eversion    (Blank rows = not tested)  LOWER EXTREMITY MMT:  MMT Right eval Left eval  Hip flexion 3/5 3/5  Hip extension 3/5 3-/5  Hip abduction  3-/5  Hip adduction    Hip internal rotation    Hip external rotation    Knee flexion    Knee extension 3/5   Ankle dorsiflexion    Ankle plantarflexion    Ankle inversion    Ankle eversion    At Evaluation all strength testing is grossly seated and functionally standing / gait. (Blank rows = not tested)  BED MOBILITY:  Sit to supine Complete Independence Supine to sit Complete Independence Rolling to Right Complete Independence  TRANSFERS: 04/10/2024: Pt able stand to/from sit into chair without armrest using BUEs on seat and RW for stabilization with supervision/verbal cues.  Eval: Sit to stand: supervision requires a stable object to arise, stabilization w/  walker using UE support, with delayed weight shift onto the prosthesis for stabilization, and uses UE to bring Lt prosthesis back below his body  Stand to sit: supervision requires a stable object to arise, stabilization w/ walker using UE support, with delayed weight shift onto the prosthesis for stabilization  FUNCTIONAL TESTs:  04/10/2024: Patient able to reach 10 forward and look over his shoulders with single upper extremity support on RW.   Eval:  BERG score 11/56  GAIT: 04/10/2024: Patient ambulated 125' with RW and supervision.   Patient negotiated 12*ramp and 6.5 curb with RW and supervision  Eval: Gait pattern: step to pattern, decreased step length- Right, decreased stance time- Left, decreased hip/knee flexion- Left, circumduction- Left, Left hip hike, antalgic, trendelenburg, lateral lean- Right, trunk flexed, abducted- Left, and poor foot clearance- Left Distance walked: 62 ft Assistive device utilized: Walker - 2 wheeled and AKA prosthesis Level of assistance: SBA / steady with RW support but needs cues  Gait velocity: 0.72 ft/sec Comments: excessive weightbearing on BUEs using RW  RAMP : not tested  CURB: not tested  CURRENT PROSTHETIC WEAR ASSESSMENT:   Patient is dependent with: skin check, residual limb care, care of non-amputated limb, prosthetic cleaning, ply sock cleaning, correct ply sock adjustment, proper wear schedule/adjustment, and proper weight-bearing schedule/adjustment Donning prosthesis: SBA / verbal cues Doffing prosthesis: Complete Independence Prosthetic wear tolerance: 6-8 hours, 1x/day, 7 days/week Prosthetic weight bearing tolerance: tolerated standing for 10 minutes with partial weight on prosthesis during BERG balance test with no increased pain. Although he reports standing pain with activities at home. Edema: minimal Residual limb condition: 3x82mm superficial wound distally located, scar with mild  adherence, no adductor roll, normal skin  color, decreased hair growth present Prosthetic description: silicone liner with suction ring suspension, ischial containment socket with flexible inner socket, rotator unit, SAFETY (single axis friction engaging with an extension assist), flexible keel foot   TODAY'S TREATMENT:                                                                                                                    DATE: 04/23/2024 Prosthetic Training with Transfemoral Prosthesis  4 Square stepping pattern activity with cane and minA/CGA.  Patient had difficulty stepping in all directions due to various things such as long step length, instability, and putting weight on his prosthesis before locking out the knee. PT demo & verbal cues on safe environment to challenge balance with 4-square stepping but able to catch himself with intermittent touch on chair backs or RW enabling him to perform without PT assist.  PT verbally educated and demoed how to negotiate 4 inch curb with cane.  Patient verbalized and demoed understanding.  Patient negotiated 4 inch curb with HHA and cane 3 reps.  Verbal cues needed for cane gait pattern .  Progressed to Patient negotiated 4 inch curb with cane and minA / CGA with close position for HHA stabilization if needed (but able to perform without touching hand).  Verbal cues needed for cane gait pattern reminders Patient ambulated  22' with open water  bottle, cane, and CGA.  Before initiating walk patient had to reach for water  bottle on desk which caused him to lose balance and required maxA to recover. After ambulation, patient reached to put water  bottle down with no spills.  Pt reported that he found this more challenging than expected.  Patient negotiated stairs with step to pattern with single rail support demo'ing what he is currently doing at home.  Patient negotiated stairs with step to pattern with single rail and cane support. Patient felt more stable this way and appeared safer.   TODAY'S  TREATMENT:                                                                                                                    DATE: 04/16/2024 Prosthetic Training with Transfemoral Prosthesis  PT verbally educated patient about using ply socks appropriately including donning. Trialed different ply sock sizes and ambulated to feel appropriate fit.  Patient verbalized and demoed understanding Patient ambulated 51' with cane and CGA and performed a stand to sit into chair with no armrests.  Patient fatigued after exercise Patient  ambulated 25' with cane and CGA.  Patient had a loss of balance which required maxA x2 to recover from to prevent a fall.  Patient ambulated 36' with cane and CGA.  Minimal loss of balance when transferring from cane to RW  Neuro Re-Ed Following exercises were done with blue Thera-Band to challenge standing balance Single arm row with each arm independently and bilateral rows, 5x Single arm forward punch with each arm independently and bilateral punches, 5x Single bicep curl plus shoulder flexion with each arm independently and bilateral bicep curl plus shoulder flexion, 5x   TREATMENT:                                                                                                                             DATE: 04/12/2024 Prosthetic Training with Transfemoral Prosthesis  PT discussing wear time with transfemoral prosthesis. Patient noting increasing time to 2x6 hours with 2 hour break as of yesterday, but described day as more of 8 hours on, 30 minutes off, and 8 hours on before removing prosthesis for the night. Patient does endorse removing prosthesis for about 2 hours occasionally, however, does not like to sit still for long periods of time and does not want to perform mobility with RW without prosthesis. PT reminding patient to give himself enough breaks built in for sweat checks, drying residual limb and prosthesis, and for no overuse. Patient endorsing completing dry  time breaks within his day for about 5-10 minutes a day.  PT discussed ply socks with patient and their importance especially over the next few months before he receives a new socket as his residual limb is going to change shape and the ply socks will assist with appropriate fit. Patient endorses returning to clinic after session to make next appointment. PT reminded patient to bring ply socks to next session if he were to receive them while visiting the clinic to make his next appointment with the prosthetist. Reassessed height of RW as patient ambulated with increased trunk flexion and thoracic kyphosis at beginning of session. PT increased height of RW to appropriate height by two settings in order to decrease postural compensations; patient performing with improved posture after adjustments. Patient performing several sit<>stands from standard arm chair with close SBA with intermittent difficulty bending prosthesis when sitting. No required increased physical assist from PT or uncontrolled movements. Patient performing household distance ambulation, ~2x30', with RW and close SBA with noted very large Lt step lengths. Patient will benefit from gait training to equalize step lengths and stance time.  Neuro Re-Ed:  Patient performing multiple balance activities this date with CGA-minA while standing in a corner and having a chair in front of him for safety and confidence.  2x30s with narrow BOS; increased sway noted but no need for increased physical assistance from PT  2x30s with semi tandem stance; increased sway noted with minimal instances of posterior LOB; patient able to catch himself on wall and self-correct back into  starting stance  2x30s with narrow BOS and horizontal head turns; patient requiring increased assist to minA for lateral and posterior LOB that was worse with Lt head turn  2x30s with narrow BOS and vertical head turns; patient experiencing increased sway and posterior LOB with  patient able to catch on wall and self-correct back into starting stance  1x30s with narrow BOS and eyes closed; patient experiencing fear and few instances of LOB (posterior, anterior, and lateral) with only one instance of requiring increased physical assist from PT to return to starting stance PT discussed with patient about not beginning these exercises at home yet due to busy home life and plenty of exercises already on HEP, but to expect that they will eventually be added.   PATIENT EDUCATION: PATIENT EDUCATED ON FOLLOWING PROSTHETIC CARE: Education details: Sitting positioning to limit skin traction from weight of prosthesis, how hip flexion contracture effects residual limb pain & posture,  Prosthetic cleaning and Propper donning  Person educated: Patient, Spouse, and close family friend Education method: Explanation, Demonstration, Tactile cues, and Verbal cues Education comprehension: verbalized understanding and needs further education  HOME EXERCISE PROGRAM: Access Code: [email or web address] URL: [email or web address] Date: 03/20/2024 Prepared by: Grayce Spatz  Exercises - Modified Thomas Stretch  - 2 x daily - 7 x weekly - 1 sets - 2-3 reps - 30 seconds hold - Hip Flexor Stretch at Edge of Bed  - 2 x daily - 7 x weekly - 1 sets - 2-3 reps - 30 seconds hold - Standing posture with back to counter  - 2 x daily - 7 x weekly - 1 sets - 1 reps - 5-10 minutes hold - Upright Stance at Door Frame Single Arm  - 3-6 x daily - 7 x weekly - 1 sets - 2 reps - 2 deep breathes hold - Upright Stance at Door Frame with Both Arms  - 3-6 x daily - 7 x weekly - 1 sets - 2 reps - 2 deep breathes hold  Do each exercise 1-2  times per day Do each exercise 5-10 repetitions Hold each exercise for 2 seconds to feel your location  AT SINK FIND YOUR MIDLINE POSITION AND PLACE FEET EQUAL DISTANCE FROM THE MIDLINE.  Try to find this position when standing still for activities.   USE TAPE ON FLOOR TO  MARK THE MIDLINE POSITION which is even with middle of sink.  You also should try to feel with your limb pressure in socket.  You are trying to feel with limb what you used to feel with the bottom of your foot.  Side to Side Shift: Moving your hips only (not shoulders): move weight onto your left leg, HOLD/FEEL pressure in socket.  Move back to equal weight on each leg, HOLD/FEEL pressure in socket. Move weight onto your right leg, HOLD/FEEL pressure in socket. Move back to equal weight on each leg, HOLD/FEEL pressure in socket. Repeat.  Start with both hands on sink, progress to hand on prosthetic side only, then no hands.  Front to Back Shift: Moving your hips only (not shoulders): move your weight forward onto your toes, HOLD/FEEL pressure in socket. Move your weight back to equal Flat Foot on both legs, HOLD/FEEL  pressure in socket. Move your weight back onto your heels, HOLD/FEEL  pressure in socket. Move your weight back to equal on both legs, HOLD/FEEL  pressure in socket. Repeat.  Start with both hands on sink, progress to hand on prosthetic side only, then no hands.  Moving Cones / Cups: With equal weight on each leg: Hold on with one hand the first time, then progress to no hand supports. Move cups from one side of sink to the other. Place cups ~2" out of your reach, progress to 10" beyond reach.  Place one hand in middle of sink and reach with other hand. Do both arms.  Then hover one hand and move cups with other hand.  Overhead/Upward Reaching: alternated reaching up to top cabinets or ceiling if no cabinets present. Keep equal weight on each leg. Start with one hand support on counter while other hand reaches and progress to no hand support with reaching.  ace one hand in middle of sink and reach with other hand. Do both arms.  Then hover one hand and move cups with other hand.  5.   Looking Over Shoulders: With equal weight on each leg: alternate turning to look over your shoulders with one  hand support on counter as needed.  Start with head motions only to look in front of shoulder, then even with shoulder and progress to looking behind you. To look to side, move head /eyes, then shoulder on side looking pulls back, shift more weight to side looking and pull hip back. Place one hand in middle of sink and let go with other hand so your shoulder can pull back. Switch hands to look other way.   Then hover one hand and look over shoulder. If looking right, use left hand at sink. If looking left, use right hand at sink. 6.  Stepping with leg that is not amputated:  Move items under cabinet out of your way. Shift your hips/pelvis so weight on prosthesis. Tighten muscles in hip on prosthetic side.  SLOWLY step other leg so front of foot is in cabinet. Then step back to floor.    ASSESSMENT:  CLINICAL IMPRESSION:   Patient overall doing well. Patient has a difficult time with step length consistency during ambulation and activities. Patient also has a difficult time controlling his prosthesis which causes his LOBs. Patient has a good understanding of how to negotiate curbs but needs continued practice before doing it out in the community. Patient will continue to benefit from skilled PT to address impairments and gain independence.   OBJECTIVE IMPAIRMENTS: Abnormal gait, decreased activity tolerance, decreased balance, decreased endurance, decreased knowledge of condition, decreased knowledge of use of DME, decreased mobility, difficulty walking, decreased ROM, decreased strength, impaired flexibility, postural dysfunction, prosthetic dependency , and pain.   ACTIVITY LIMITATIONS: carrying, lifting, bending, sitting, standing, squatting, stairs, transfers, bathing, toileting, dressing, reach over head, and locomotion level  PARTICIPATION LIMITATIONS: meal prep, cleaning, laundry, driving, shopping, community activity, and yard work  PERSONAL FACTORS: Age, Fitness, Past/current experiences, Time  since onset of injury/illness/exacerbation, and 3+ comorbidities: see PMH are also affecting patient's functional outcome.   REHAB POTENTIAL: Good  CLINICAL DECISION MAKING: Evolving/moderate complexity  EVALUATION COMPLEXITY: Moderate   GOALS: Goals reviewed with patient? Yes  SHORT TERM GOALS: Target date: 04/05/2024  Patient donnes prosthesis modified independent & verbalizes proper cleaning. Baseline: SEE OBJECTIVE DATA Goal status: MET, 04/10/2024  2.  Patient tolerates prosthesis >12 hrs total /day without skin issues or limb pain >7/10 after standing. Baseline: patient reports wearing 4-5 hr/day Goal status: NOT MET, 04/10/2024  3.  Patient able to reach 7 and look over both shoulders with RW support with Modified independent. Baseline: SEE OBJECTIVE DATA Goal status: MET, 04/10/2024  4. Patient ambulates 125' with RW &  prosthesis with supervision. Baseline: SEE OBJECTIVE DATA Goal status: MET, 04/10/2024  5. Patient negotiates ramps & curbs with RW & prosthesis with supervision. Baseline: SEE OBJECTIVE DATA Goal status: MET, 04/10/2024  LONG TERM GOALS: Target date: 05/31/2024  Patient demonstrates & verbalized understanding of prosthetic care to enable safe utilization of prosthesis. Baseline: SEE OBJECTIVE DATA Goal status: Ongoing   03/20/2024  Patient tolerates prosthesis wear >90% of awake hours without skin issues or limb pain >4/10. Baseline: SEE OBJECTIVE DATA Goal status: Ongoing   03/20/2024  BERG >/= 25/56 to indicate lower fall risk Baseline: SEE OBJECTIVE DATA Goal status: Ongoing   03/20/2024  Patient ambulates >300' with prosthesis and LRAD independently Baseline: SEE OBJECTIVE DATA Goal status: Ongoing   03/20/2024  Patient negotiates ramps, curbs & stairs with single rail with prosthesis and LRAD independently. Baseline: SEE OBJECTIVE DATA Goal status: Ongoing   03/20/2024  6.  Patient reports Patient-Specific Activity Score improved the average by 3  to indicate improvement in functional activities.   Baseline: SEE OBJECTIVE DATA Goal status: Ongoing   03/20/2024  PLAN:  PT FREQUENCY: 2x/week  PT DURATION: 12 weeks  PLANNED INTERVENTIONS: 97164- PT Re-evaluation, 97750- Physical Performance Testing, 97110-Therapeutic exercises, 97530- Therapeutic activity, [id number]- Neuromuscular re-education, 97535- Self Care, [id number]- Manual therapy, [phone]- Gait training, [phone]- Prosthetic Initial , [phone]- Orthotic/Prosthetic subsequent, Patient/Family education, Balance training, Stair training, Joint mobilization, and DME instructions  PLAN FOR NEXT SESSION: objective measure of 4 square step test and gait velocity, challenge step length consistency, ambulation with cane including curb.    Ismael Theophilus Stallion, SPT 04/23/24 3:44 PM   This entire session of physical therapy was performed under the direct supervision of PT signing evaluation /treatment. PT reviewed note and agrees.   Grayce Spatz, PT, DPT 04/23/2024, 4:32 PM   Date of referral: 02/13/2024 Referring provider: Harden Jerona GAILS, MD Referring diagnosis? S10.387 (ICD-10-CM) - Hx of AKA (above knee amputation), left (HCC)  Treatment diagnosis? (if different than referring diagnosis) R26.89, M62.81, M79.605, M25.652, R29.3, R26.81, L98.498  What was this (referring dx) caused by? Other: Lt AKA  Nature of Condition: Initial Onset (within last 3 months)   Laterality: Lt  Current Functional Measure Score: Patient Specific Functional Scale Score:4  Objective measurements identify impairments when they are compared to normal values, the uninvolved extremity, and prior level of function.  [x]  Yes  []  No  Objective assessment of functional ability: Moderate functional limitations   Briefly describe symptoms: Patient is dependent in prosthetic care and use with high risk of falls. Gait deviation, muscle weakness, pain in Lt residual limb, hip flexion contracture.  How did symptoms  start: amputation due to vascular issues  Average pain intensity:  Last 24 hours: 8-9/10  Past week: 8-9/10  How often does the pt experience symptoms? Constantly  How much have the symptoms interfered with usual daily activities? Quite a bit  How has condition changed since care began at this facility? NA - initial visit  In general, how is the patients overall health? Good   BACK PAIN (STarT Back Screening Tool) No

## 2024-04-25 NOTE — Therapy (Signed)
 OUTPATIENT PHYSICAL THERAPY PROSTHETIC TREATMENT   Patient Name: Mike Floyd MRN: [id number] DOB:1961/06/08, 63 y.o., male Today's Date: 04/26/2024  END OF SESSION:  PT End of Session - 04/26/24 1433     Visit Number 12    Number of Visits 25    Date for Recertification  05/31/24    Authorization Type [employer] MEDICARE    Authorization Time Period $45 copay    Progress Note Due on Visit 20    PT Start Time 1433    PT Stop Time 1514    PT Time Calculation (min) 41 min    Equipment Utilized During Treatment Gait belt    Activity Tolerance Patient tolerated treatment well;Patient limited by fatigue    Behavior During Therapy WFL for tasks assessed/performed              Past Medical History:  Diagnosis Date   AICD (automatic cardioverter/defibrillator) present 11/2007   Removed in 2018, a. 11/2007 SJM Current VR - single lead ICD  - Removed 2018 - it was burning me   Anxiety    CAD (coronary artery disease)    non-obstructive CAD by Cor CT in 2020 // Myoview  3/22: EF 57, small inf-sept defect c/w scar, no ischemia; low risk      Chest pain    a. 10/2007 Cath:  normal Cors.   CKD (chronic kidney disease), stage II    DDD (degenerative disc disease), lumbar    Diabetes mellitus DX: 2010   type 2   Erosive esophagitis    a. per EGD (08/2011), Dr. Golda - Erosive reflux esophagitis improved but not completely healed since previous EGD 3 years ago. Bx showing  ulcerated gatroesophageal junction mucosa. negative for H. pylori   GERD (gastroesophageal reflux disease)    Gout    Hearing deficit    a. wear bilateral hearing aides   History of hiatal hernia    History of kidney stones    passed stones, no surgery   Hypertension    Mildly dilatd aortic root (HCC)    CMR 4/22: EF 52, no LGE; d/w Dr. Ione root 38 mm (mildly dilated)   Myocardial infarction [facility]) 2011   Neuropathy    Feet and legs   Nonischemic dilated cardiomyopathy (HCC)    a. H/O EF as low as 35-40%  by LV gram 10/2007;  b. Echo 02/2011 EF 50-55%, inf HK, Gr 1 DD // CMR 4/22: EF 52, no LGE; d/w Dr. Ione root 38 mm (mildly dilated)    Renal insufficiency    Sleep apnea    pt doesnt use, statesI cant afford one. PCP aware   Stroke (HCC)    mini-stroke in 2014   TIA (transient ischemic attack)    July, 2013   Tobacco abuse, in remission 06/27/2009   Discontinued in 2009     Wears dentures    top plate   WPW (Wolff-Parkinson-White syndrome)    a. s/p RFCA @ [facility] - 1999   Past Surgical History:  Procedure Laterality Date   AMPUTATION Left 05/19/2022   Procedure: TRANSMETATARSAL AMPUTATION LEFT FOOT;  Surgeon: Harden Jerona GAILS, MD;  Location: [facility] OR;  Service: Orthopedics;  Laterality: Left;   AMPUTATION Left 06/17/2023   Procedure: LEFT BELOW KNEE AMPUTATION;  Surgeon: Harden Jerona GAILS, MD;  Location: [facility] OR;  Service: Orthopedics;  Laterality: Left;   AMPUTATION Left 08/19/2023   Procedure: LEFT ABOVE KNEE AMPUTATION;  Surgeon: Harden Jerona GAILS, MD;  Location: [facility] OR;  Service: Orthopedics;  Laterality: Left;   APPENDECTOMY     BACK SURGERY     1995   CARDIAC CATHETERIZATION     2009   CARDIAC DEFIBRILLATOR PLACEMENT     ICD was removed in 2018   CHOLECYSTECTOMY     CHOLECYSTECTOMY, LAPAROSCOPIC     11/2007   COLONOSCOPY W/ POLYPECTOMY  2009   ELBOW SURGERY Left 06/2010   ESOPHAGEAL DILATION N/A 11/08/2014   Procedure: ESOPHAGEAL DILATION;  Surgeon: Claudis RAYMOND Rivet, MD;  Location: [facility] ORS;  Service: Endoscopy;  Laterality: N/A;  #56,    ESOPHAGOGASTRODUODENOSCOPY  03/31/2012   also 08/2011; [name]   ESOPHAGOGASTRODUODENOSCOPY (EGD) WITH PROPOFOL  N/A 11/08/2014   Procedure: ESOPHAGOGASTRODUODENOSCOPY (EGD) WITH PROPOFOL ;  Surgeon: Claudis RAYMOND Rivet, MD;  Location: [facility] ORS;  Service: Endoscopy;  Laterality: N/A;  Hiatus is 110 , GE Junction is 37   FOOT ARTHRODESIS Left 10/24/2020   Procedure: LEFT GASTROCNEMIUS RECESSION, DORSIFLEXION OSTEOTOMY 1ST MT;  Surgeon: Harden Jerona GAILS, MD;   Location: [facility] OR;  Service: Orthopedics;  Laterality: Left;   FOOT ARTHRODESIS Right 01/09/2021   Procedure: CLOSING WEDGE OSTEOTOMY RIGHT 1ST METATARSAL;  Surgeon: Harden Jerona GAILS, MD;  Location: [facility] OR;  Service: Orthopedics;  Laterality: Right;   GASTROCNEMIUS RECESSION Right 01/09/2021   Procedure: RIGHT GASTROCNEMIUS RECESSION;  Surgeon: Harden Jerona GAILS, MD;  Location: [facility] OR;  Service: Orthopedics;  Laterality: Right;   ICD LEAD REMOVAL N/A 10/07/2016   Procedure: ICD LEAD REMOVAL ;  Surgeon: Danelle LELON Birmingham, MD;  Location: [facility] OR;  Service: Cardiovascular;  Laterality: N/A;   MULTIPLE TOOTH EXTRACTIONS     RADIOFREQUENCY ABLATION  1999   WPW; performed at [facility]   STUMP REVISION Left 07/22/2023   Procedure: REVISION LEFT BELOW KNEE AMPUTATION;  Surgeon: Harden Jerona GAILS, MD;  Location: [facility] OR;  Service: Orthopedics;  Laterality: Left;   TEE WITHOUT CARDIOVERSION N/A 10/07/2016   Procedure: TRANSESOPHAGEAL ECHOCARDIOGRAM (TEE);  Surgeon: Danelle LELON Birmingham, MD;  Location: [facility] OR;  Service: Cardiovascular;  Laterality: N/A;   Patient Active Problem List   Diagnosis Date Noted   Dyslipidemia 11/03/2023   Essential hypertension 11/03/2023   Type 2 diabetes mellitus with peripheral neuropathy (HCC) 11/03/2023   BPH (benign prostatic hyperplasia) 11/03/2023   GERD without esophagitis 11/03/2023   S/P AKA (above knee amputation) unilateral, left (HCC) 08/19/2023   Dehiscence of amputation stump of left lower extremity (HCC) 07/22/2023   Ischemia of site of left below knee amputation (HCC) 07/22/2023   S/P BKA (below knee amputation) unilateral, left (HCC) 07/22/2023   Below-knee amputation of left lower extremity (HCC) 06/17/2023   Acute osteomyelitis of metatarsal bone of left foot (HCC)    Subacute osteomyelitis, left ankle and foot (HCC) 05/13/2022   Non-pressure chronic ulcer of other part of right foot limited to breakdown of skin (HCC)    Contracture of right Achilles tendon    Mildly  dilatd aortic root (HCC)    Contracture of left Achilles tendon    Metatarsal deformity, left    CAD (coronary artery disease)    Hallux hammertoe, left 12/11/2019   Diarrhea 06/27/2019   Diastasis recti 08/22/2018   Umbilical hernia without obstruction and without gangrene 08/22/2018   Vitamin D  deficiency 06/23/2017   Essential hypertension, benign 06/15/2017   Mixed hyperlipidemia 06/15/2017   Class 1 obesity due to excess calories with serious comorbidity and body mass index (BMI) of 31.0 to 31.9 in adult 06/15/2017   Malfunction of implantable cardioverter-defibrillator (  ICD) electrode 10/07/2016   Chronic kidney disease (CKD), stage III (moderate) (HCC) 04/04/2015   Bladder neck obstruction 03/21/2015   Difficult or painful urination 03/21/2015   Flank pain 03/21/2015   Delayed onset of urination 03/21/2015   Calculus of kidney 03/21/2015   Erosive esophagitis 12/27/2013   Hypotension due to drugs 07/02/2013   Chronic systolic heart failure (HCC) 04/24/2013   CAP (community acquired pneumonia) 03/16/2013   HTN (hypertension) 03/15/2013   Hydrocele 03/15/2013   Spermatocele 03/15/2013   DOE (dyspnea on exertion) 03/15/2013   OSA (obstructive sleep apnea) 01/09/2013   Chronic kidney disease, stage 3 (HCC) 01/03/2013   Chest pain 12/26/2012   History of diagnostic tests 09/06/2012   TIA (transient ischemic attack)    Cardiomyopathy, nonischemic (HCC) 02/24/2010   AICD (automatic cardioverter/defibrillator) 02/24/2010   DM type 2 causing vascular disease (HCC) 06/27/2009   Gout 06/27/2009   Tobacco abuse, in remission 06/27/2009   COLONIC POLYPS 10/31/2008   GERD (gastroesophageal reflux disease) 10/31/2008    PCP: Bertell Satterfield, MD  REFERRING PROVIDER: Harden Jerona GAILS, MD  REFERRING DIAG: [id number] (ICD-10-CM) - Hx of AKA (above knee amputation), left (HCC)   THERAPY DIAG:  Other abnormalities of gait and mobility  Muscle weakness (generalized)  Stiffness of left  hip, not elsewhere classified  Abnormal posture  Unsteadiness on feet  Pain in left leg  Rationale for Evaluation and Treatment: Rehabilitation  ONSET DATE: Prosthesis delivery 02/20/2024  SUBJECTIVE:   SUBJECTIVE STATEMENT:   Patient noting busy personal life secondary to wife requiring surgery and may not be able to attend a lot of sessions over the next few weeks. Patient endorses going to [facility] at [facility] for a check up. No abnormal pain or soreness.   PERTINENT HISTORY: DM2, GERD, hypertension, dyslipidemia, CAD, L BKA (06/17/23), neuropathy  Eval: Patient is a 63 year old male who presents to physical therapy with a left AKA (08/19/2023) with a new prosthesis delivery on 02/20/2024. Patient had previous home health PT prior to Lt AKA. He is able to mobilize with modified independence w/ walker. Wears prosthesis about ten hours a day and by the end of the night he takes it off due to fatigue and pain.   PAIN:  Are you having pain? Yes: NPRS scale: 3/10 at rest, worst up to 5/10 Pain location: Femur in prosthesis and pain in the Rt leg as well Pain description: feels like its been hammered Aggravating factors: keeping prosthesis on for long periods of times, sitting Relieving factors: taking off prosthesis and medications  PRECAUTIONS: Fall  RED FLAGS: None   WEIGHT BEARING RESTRICTIONS: No  FALLS:  Has patient fallen in last 6 months? Yes. Number of falls 2, forgetting he doesn't have a leg and put weight onto the left side. He did not hit the floor, only fell into another person. Occurred when transferring from sitting to standing  LIVING ENVIRONMENT: Lives with: lives with their spouse Lives in: Other Trailer on grass Stairs: Yes: External: 4 steps; can reach both Has following equipment at home: Single point cane, Environmental consultant - 2 wheeled, Wheelchair (manual), and shower chair  OCCUPATION: retired   PLOF: Independent with household mobility with device  PATIENT GOALS:  ambulating and ADLs with cane   NEXT MD VISIT: Office MD visit: 03/21/2024   OBJECTIVE:  Patient-Specific Activity Scoring Scheme  0 represents "unable to perform." 10 represents "able to perform at prior level. 0 1 2 3 4 5 6 7 8 9  10 (Date and Score)  Activity Eval     1. Walking w/ cane  2    2. Standing for ADLs  5    3. Stairs 5   4.    5.    Score 4    Total score = sum of the activity scores/number of activities Minimum detectable change (90%CI) for average score = 2 points Minimum detectable change (90%CI) for single activity score = 3 points  DIAGNOSTIC FINDINGS: none post-amputation  COGNITION: Overall cognitive status: Within functional limits for tasks assessed   SENSATION: WFL  MUSCLE LENGTH:   POSTURE: rounded shoulders, forward head, flexed trunk , and weight shift right  LOWER EXTREMITY ROM:  ROM P:passive  A:active Left eval  Hip flexion   Hip extension Thomas position: P: - 40*  Hip abduction   Hip adduction   Hip internal rotation   Hip external rotation   Knee flexion   Knee extension   Ankle dorsiflexion   Ankle plantarflexion   Ankle inversion   Ankle eversion    (Blank rows = not tested)  LOWER EXTREMITY MMT:  MMT Right eval Left eval  Hip flexion 3/5 3/5  Hip extension 3/5 3-/5  Hip abduction  3-/5  Hip adduction    Hip internal rotation    Hip external rotation    Knee flexion    Knee extension 3/5   Ankle dorsiflexion    Ankle plantarflexion    Ankle inversion    Ankle eversion    At Evaluation all strength testing is grossly seated and functionally standing / gait. (Blank rows = not tested)  BED MOBILITY:  Sit to supine Complete Independence Supine to sit Complete Independence Rolling to Right Complete Independence  TRANSFERS: 04/10/2024: Pt able stand to/from sit into chair without armrest using BUEs on seat and RW for stabilization with supervision/verbal cues.  Eval: Sit to stand: supervision requires  a stable object to arise, stabilization w/ walker using UE support, with delayed weight shift onto the prosthesis for stabilization, and uses UE to bring Lt prosthesis back below his body  Stand to sit: supervision requires a stable object to arise, stabilization w/ walker using UE support, with delayed weight shift onto the prosthesis for stabilization  FUNCTIONAL TESTs:  04/10/2024: Patient able to reach 10 forward and look over his shoulders with single upper extremity support on RW.   Eval:  BERG score 11/56  GAIT: 04/10/2024: Patient ambulated 125' with RW and supervision.   Patient negotiated 12*ramp and 6.5 curb with RW and supervision  Eval: Gait pattern: step to pattern, decreased step length- Right, decreased stance time- Left, decreased hip/knee flexion- Left, circumduction- Left, Left hip hike, antalgic, trendelenburg, lateral lean- Right, trunk flexed, abducted- Left, and poor foot clearance- Left Distance walked: 62 ft Assistive device utilized: Walker - 2 wheeled and AKA prosthesis Level of assistance: SBA / steady with RW support but needs cues  Gait velocity: 0.72 ft/sec Comments: excessive weightbearing on BUEs using RW  RAMP : not tested  CURB: not tested  CURRENT PROSTHETIC WEAR ASSESSMENT:   Patient is dependent with: skin check, residual limb care, care of non-amputated limb, prosthetic cleaning, ply sock cleaning, correct ply sock adjustment, proper wear schedule/adjustment, and proper weight-bearing schedule/adjustment Donning prosthesis: SBA / verbal cues Doffing prosthesis: Complete Independence Prosthetic wear tolerance: 6-8 hours, 1x/day, 7 days/week Prosthetic weight bearing tolerance: tolerated standing for 10 minutes with partial weight on prosthesis during BERG balance test with no increased pain. Although he reports standing pain with activities  at home. Edema: minimal Residual limb condition: 3x36mm superficial wound distally located, scar with mild  adherence, no adductor roll, normal skin color, decreased hair growth present Prosthetic description: silicone liner with suction ring suspension, ischial containment socket with flexible inner socket, rotator unit, SAFETY (single axis friction engaging with an extension assist), flexible keel foot  TODAY'S TREATMENT:                                                                                                                    DATE: 04/26/2024 Prosthetic Training with Transfemoral Prosthesis  Patient performed several sit<>stands throughout session with SUP>SBA using both RW and SPC with quad tip. Patient able to verbalize appropriate placement of SPC with quad tip during sit<>stands. Patient ambulating throughout session with SPC with quad tip and CGA for 51'9, 20', and 66'. Patient performing with improved forward gaze this date but continues to have forward lean and thoracic kyphosis. PT discussed and educated patient on increased energy expenditure post-amputation as well as ambulating with SPC vs RW.   Neuro Re-Ed:  Patient performed standing taps to light up pods on window to challenge static balance, reaching outside of BOS, and reaction time. Patient performing 30s of each activity with CGA and no required increased physical assist or overt LOB.  Normal stance, narrow stance, semi-tandem stance with Rt LE back, semi-tandem with Lt LE back, semi-tandem with Lt LE back using only Rt hand, and semi-tandem Lt LE back using only Lt hand  Patient performing tossing and catching for 4x30s in parallel bars with CGA throughout while standing in typical stance, narrow stance, and 2x in semi-tandem with Lt LE back. Intermittent posterolateral LOB with need for increase in physical assist to at least mod A, though patient able to use bilat UE on parallel bars.    TODAY'S TREATMENT:                                                                                                                    DATE:  04/23/2024 Prosthetic Training with Transfemoral Prosthesis  4 Square stepping pattern activity with cane and minA/CGA.  Patient had difficulty stepping in all directions due to various things such as long step length, instability, and putting weight on his prosthesis before locking out the knee. PT demo & verbal cues on safe environment to challenge balance with 4-square stepping but able to catch himself with intermittent touch on chair backs or RW enabling him to perform without PT assist.  PT verbally educated and demoed  how to negotiate 4 inch curb with cane.  Patient verbalized and demoed understanding.  Patient negotiated 4 inch curb with HHA and cane 3 reps.  Verbal cues needed for cane gait pattern .  Progressed to Patient negotiated 4 inch curb with cane and minA / CGA with close position for HHA stabilization if needed (but able to perform without touching hand).  Verbal cues needed for cane gait pattern reminders Patient ambulated  56' with open water  bottle, cane, and CGA.  Before initiating walk patient had to reach for water  bottle on desk which caused him to lose balance and required maxA to recover. After ambulation, patient reached to put water  bottle down with no spills.  Pt reported that he found this more challenging than expected.  Patient negotiated stairs with step to pattern with single rail support demo'ing what he is currently doing at home.  Patient negotiated stairs with step to pattern with single rail and cane support. Patient felt more stable this way and appeared safer.   TODAY'S TREATMENT:                                                                                                                    DATE: 04/16/2024 Prosthetic Training with Transfemoral Prosthesis  PT verbally educated patient about using ply socks appropriately including donning. Trialed different ply sock sizes and ambulated to feel appropriate fit.  Patient verbalized and demoed understanding Patient  ambulated 63' with cane and CGA and performed a stand to sit into chair with no armrests.  Patient fatigued after exercise Patient ambulated 25' with cane and CGA.  Patient had a loss of balance which required maxA x2 to recover from to prevent a fall.  Patient ambulated 20' with cane and CGA.  Minimal loss of balance when transferring from cane to RW  Neuro Re-Ed Following exercises were done with blue Thera-Band to challenge standing balance Single arm row with each arm independently and bilateral rows, 5x Single arm forward punch with each arm independently and bilateral punches, 5x Single bicep curl plus shoulder flexion with each arm independently and bilateral bicep curl plus shoulder flexion, 5x   TREATMENT:                                                                                                                             DATE: 04/12/2024 Prosthetic Training with Transfemoral Prosthesis  PT discussing wear time with transfemoral prosthesis. Patient noting increasing time to 2x6  hours with 2 hour break as of yesterday, but described day as more of 8 hours on, 30 minutes off, and 8 hours on before removing prosthesis for the night. Patient does endorse removing prosthesis for about 2 hours occasionally, however, does not like to sit still for long periods of time and does not want to perform mobility with RW without prosthesis. PT reminding patient to give himself enough breaks built in for sweat checks, drying residual limb and prosthesis, and for no overuse. Patient endorsing completing dry time breaks within his day for about 5-10 minutes a day.  PT discussed ply socks with patient and their importance especially over the next few months before he receives a new socket as his residual limb is going to change shape and the ply socks will assist with appropriate fit. Patient endorses returning to clinic after session to make next appointment. PT reminded patient to bring ply socks to next  session if he were to receive them while visiting the clinic to make his next appointment with the prosthetist. Reassessed height of RW as patient ambulated with increased trunk flexion and thoracic kyphosis at beginning of session. PT increased height of RW to appropriate height by two settings in order to decrease postural compensations; patient performing with improved posture after adjustments. Patient performing several sit<>stands from standard arm chair with close SBA with intermittent difficulty bending prosthesis when sitting. No required increased physical assist from PT or uncontrolled movements. Patient performing household distance ambulation, ~2x30', with RW and close SBA with noted very large Lt step lengths. Patient will benefit from gait training to equalize step lengths and stance time.  Neuro Re-Ed:  Patient performing multiple balance activities this date with CGA-minA while standing in a corner and having a chair in front of him for safety and confidence.  2x30s with narrow BOS; increased sway noted but no need for increased physical assistance from PT  2x30s with semi tandem stance; increased sway noted with minimal instances of posterior LOB; patient able to catch himself on wall and self-correct back into starting stance  2x30s with narrow BOS and horizontal head turns; patient requiring increased assist to minA for lateral and posterior LOB that was worse with Lt head turn  2x30s with narrow BOS and vertical head turns; patient experiencing increased sway and posterior LOB with patient able to catch on wall and self-correct back into starting stance  1x30s with narrow BOS and eyes closed; patient experiencing fear and few instances of LOB (posterior, anterior, and lateral) with only one instance of requiring increased physical assist from PT to return to starting stance PT discussed with patient about not beginning these exercises at home yet due to busy home life and plenty of  exercises already on HEP, but to expect that they will eventually be added.   PATIENT EDUCATION: PATIENT EDUCATED ON FOLLOWING PROSTHETIC CARE: Education details: Sitting positioning to limit skin traction from weight of prosthesis, how hip flexion contracture effects residual limb pain & posture,  Prosthetic cleaning and Propper donning  Person educated: Patient, Spouse, and close family friend Education method: Explanation, Demonstration, Tactile cues, and Verbal cues Education comprehension: verbalized understanding and needs further education  HOME EXERCISE PROGRAM: Access Code: [email or web address] URL: [email or web address] Date: 03/20/2024 Prepared by: Grayce Spatz  Exercises - Modified Thomas Stretch  - 2 x daily - 7 x weekly - 1 sets - 2-3 reps - 30 seconds hold - Hip Flexor Stretch at Edge of Bed  - 2 x daily -  7 x weekly - 1 sets - 2-3 reps - 30 seconds hold - Standing posture with back to counter  - 2 x daily - 7 x weekly - 1 sets - 1 reps - 5-10 minutes hold - Upright Stance at Door Frame Single Arm  - 3-6 x daily - 7 x weekly - 1 sets - 2 reps - 2 deep breathes hold - Upright Stance at Door Frame with Both Arms  - 3-6 x daily - 7 x weekly - 1 sets - 2 reps - 2 deep breathes hold  Do each exercise 1-2  times per day Do each exercise 5-10 repetitions Hold each exercise for 2 seconds to feel your location  AT SINK FIND YOUR MIDLINE POSITION AND PLACE FEET EQUAL DISTANCE FROM THE MIDLINE.  Try to find this position when standing still for activities.   USE TAPE ON FLOOR TO MARK THE MIDLINE POSITION which is even with middle of sink.  You also should try to feel with your limb pressure in socket.  You are trying to feel with limb what you used to feel with the bottom of your foot.  Side to Side Shift: Moving your hips only (not shoulders): move weight onto your left leg, HOLD/FEEL pressure in socket.  Move back to equal weight on each leg, HOLD/FEEL pressure in socket.  Move weight onto your right leg, HOLD/FEEL pressure in socket. Move back to equal weight on each leg, HOLD/FEEL pressure in socket. Repeat.  Start with both hands on sink, progress to hand on prosthetic side only, then no hands.  Front to Back Shift: Moving your hips only (not shoulders): move your weight forward onto your toes, HOLD/FEEL pressure in socket. Move your weight back to equal Flat Foot on both legs, HOLD/FEEL  pressure in socket. Move your weight back onto your heels, HOLD/FEEL  pressure in socket. Move your weight back to equal on both legs, HOLD/FEEL  pressure in socket. Repeat.  Start with both hands on sink, progress to hand on prosthetic side only, then no hands.  Moving Cones / Cups: With equal weight on each leg: Hold on with one hand the first time, then progress to no hand supports. Move cups from one side of sink to the other. Place cups ~2" out of your reach, progress to 10" beyond reach.  Place one hand in middle of sink and reach with other hand. Do both arms.  Then hover one hand and move cups with other hand.  Overhead/Upward Reaching: alternated reaching up to top cabinets or ceiling if no cabinets present. Keep equal weight on each leg. Start with one hand support on counter while other hand reaches and progress to no hand support with reaching.  ace one hand in middle of sink and reach with other hand. Do both arms.  Then hover one hand and move cups with other hand.  5.   Looking Over Shoulders: With equal weight on each leg: alternate turning to look over your shoulders with one hand support on counter as needed.  Start with head motions only to look in front of shoulder, then even with shoulder and progress to looking behind you. To look to side, move head /eyes, then shoulder on side looking pulls back, shift more weight to side looking and pull hip back. Place one hand in middle of sink and let go with other hand so your shoulder can pull back. Switch hands to look other way.    Then hover one hand  and look over shoulder. If looking right, use left hand at sink. If looking left, use right hand at sink. 6.  Stepping with leg that is not amputated:  Move items under cabinet out of your way. Shift your hips/pelvis so weight on prosthesis. Tighten muscles in hip on prosthetic side.  SLOWLY step other leg so front of foot is in cabinet. Then step back to floor.    ASSESSMENT:  CLINICAL IMPRESSION:    Patient arrived to session noting slight soreness in residual limb and increased exhaustion secondary to busy personal life. Patient improved performance with gaze preference during ambulation though continues to ambulate with poor trunk posture. Patient requiring intermittent rest breaks throughout session. Patient is unable to return until mid-October secondary to health concerns with wife. Patient will continue to benefit from skilled PT.  OBJECTIVE IMPAIRMENTS: Abnormal gait, decreased activity tolerance, decreased balance, decreased endurance, decreased knowledge of condition, decreased knowledge of use of DME, decreased mobility, difficulty walking, decreased ROM, decreased strength, impaired flexibility, postural dysfunction, prosthetic dependency , and pain.   ACTIVITY LIMITATIONS: carrying, lifting, bending, sitting, standing, squatting, stairs, transfers, bathing, toileting, dressing, reach over head, and locomotion level  PARTICIPATION LIMITATIONS: meal prep, cleaning, laundry, driving, shopping, community activity, and yard work  PERSONAL FACTORS: Age, Fitness, Past/current experiences, Time since onset of injury/illness/exacerbation, and 3+ comorbidities: see PMH are also affecting patient's functional outcome.   REHAB POTENTIAL: Good  CLINICAL DECISION MAKING: Evolving/moderate complexity  EVALUATION COMPLEXITY: Moderate   GOALS: Goals reviewed with patient? Yes  SHORT TERM GOALS: Target date: 04/05/2024  Patient donnes prosthesis modified independent &  verbalizes proper cleaning. Baseline: SEE OBJECTIVE DATA Goal status: MET, 04/10/2024  2.  Patient tolerates prosthesis >12 hrs total /day without skin issues or limb pain >7/10 after standing. Baseline: patient reports wearing 4-5 hr/day Goal status: NOT MET, 04/10/2024  3.  Patient able to reach 7 and look over both shoulders with RW support with Modified independent. Baseline: SEE OBJECTIVE DATA Goal status: MET, 04/10/2024  4. Patient ambulates 125' with RW & prosthesis with supervision. Baseline: SEE OBJECTIVE DATA Goal status: MET, 04/10/2024  5. Patient negotiates ramps & curbs with RW & prosthesis with supervision. Baseline: SEE OBJECTIVE DATA Goal status: MET, 04/10/2024  LONG TERM GOALS: Target date: 05/31/2024  Patient demonstrates & verbalized understanding of prosthetic care to enable safe utilization of prosthesis. Baseline: SEE OBJECTIVE DATA Goal status: Ongoing   03/20/2024  Patient tolerates prosthesis wear >90% of awake hours without skin issues or limb pain >4/10. Baseline: SEE OBJECTIVE DATA Goal status: Ongoing   03/20/2024  BERG >/= 25/56 to indicate lower fall risk Baseline: SEE OBJECTIVE DATA Goal status: Ongoing   03/20/2024  Patient ambulates >300' with prosthesis and LRAD independently Baseline: SEE OBJECTIVE DATA Goal status: Ongoing   03/20/2024  Patient negotiates ramps, curbs & stairs with single rail with prosthesis and LRAD independently. Baseline: SEE OBJECTIVE DATA Goal status: Ongoing   03/20/2024  6.  Patient reports Patient-Specific Activity Score improved the average by 3 to indicate improvement in functional activities.   Baseline: SEE OBJECTIVE DATA Goal status: Ongoing   03/20/2024  PLAN:  PT FREQUENCY: 2x/week  PT DURATION: 12 weeks  PLANNED INTERVENTIONS: 97164- PT Re-evaluation, 97750- Physical Performance Testing, 97110-Therapeutic exercises, 97530- Therapeutic activity, [id number]- Neuromuscular re-education, 97535- Self Care, [id number]-  Manual therapy, [phone]- Gait training, [phone]- Prosthetic Initial , [phone]- Orthotic/Prosthetic subsequent, Patient/Family education, Balance training, Stair training, Joint mobilization, and DME instructions  PLAN FOR NEXT SESSION: objective measure  of 4 square step test and gait velocity, challenge step length consistency, ambulation with cane including curb.    Susannah Daring, PT, DPT 04/26/24 3:55 PM     Date of referral: 02/13/2024 Referring provider: Harden Jerona GAILS, MD Referring diagnosis? S10.387 (ICD-10-CM) - Hx of AKA (above knee amputation), left (HCC)  Treatment diagnosis? (if different than referring diagnosis) R26.89, M62.81, M79.605, M25.652, R29.3, R26.81, L98.498  What was this (referring dx) caused by? Other: Lt AKA  Nature of Condition: Initial Onset (within last 3 months)   Laterality: Lt  Current Functional Measure Score: Patient Specific Functional Scale Score:4  Objective measurements identify impairments when they are compared to normal values, the uninvolved extremity, and prior level of function.  [x]  Yes  []  No  Objective assessment of functional ability: Moderate functional limitations   Briefly describe symptoms: Patient is dependent in prosthetic care and use with high risk of falls. Gait deviation, muscle weakness, pain in Lt residual limb, hip flexion contracture.  How did symptoms start: amputation due to vascular issues  Average pain intensity:  Last 24 hours: 8-9/10  Past week: 8-9/10  How often does the pt experience symptoms? Constantly  How much have the symptoms interfered with usual daily activities? Quite a bit  How has condition changed since care began at this facility? NA - initial visit  In general, how is the patients overall health? Good   BACK PAIN (STarT Back Screening Tool) No

## 2024-04-26 ENCOUNTER — Ambulatory Visit

## 2024-04-26 DIAGNOSIS — R2689 Other abnormalities of gait and mobility: Secondary | ICD-10-CM

## 2024-04-26 DIAGNOSIS — M79605 Pain in left leg: Secondary | ICD-10-CM

## 2024-04-26 DIAGNOSIS — R2681 Unsteadiness on feet: Secondary | ICD-10-CM | POA: Diagnosis not present

## 2024-04-26 DIAGNOSIS — M6281 Muscle weakness (generalized): Secondary | ICD-10-CM

## 2024-04-26 DIAGNOSIS — M25652 Stiffness of left hip, not elsewhere classified: Secondary | ICD-10-CM

## 2024-04-26 DIAGNOSIS — R293 Abnormal posture: Secondary | ICD-10-CM

## 2024-05-01 ENCOUNTER — Other Ambulatory Visit: Payer: Self-pay

## 2024-05-01 DIAGNOSIS — I77810 Thoracic aortic ectasia: Secondary | ICD-10-CM

## 2024-05-04 ENCOUNTER — Other Ambulatory Visit (HOSPITAL_COMMUNITY)
Admission: RE | Admit: 2024-05-04 | Discharge: 2024-05-04 | Disposition: A | Discharge status: Home / Self Care | Source: Ambulatory Visit | Attending: Student | Admitting: Student

## 2024-05-04 ENCOUNTER — Ambulatory Visit: Payer: Self-pay | Admitting: Student

## 2024-05-04 DIAGNOSIS — I77810 Thoracic aortic ectasia: Secondary | ICD-10-CM | POA: Diagnosis not present

## 2024-05-04 LAB — BASIC METABOLIC PANEL WITH GFR
Anion gap: 13 (ref 5–15)
BUN: 11 mg/dL (ref 8–23)
CO2: 24 mmol/L (ref 22–32)
Calcium: 8.7 mg/dL — ABNORMAL LOW (ref 8.9–10.3)
Chloride: 99 mmol/L (ref 98–111)
Creatinine, Ser: 1.13 mg/dL (ref 0.61–1.24)
GFR, Estimated: >60 mL/min (ref >60)
Glucose, Bld: 101 mg/dL — ABNORMAL HIGH (ref 70–99)
Potassium: 3.7 mmol/L (ref 3.5–5.1)
Sodium: 136 mmol/L (ref 135–145)

## 2024-05-15 ENCOUNTER — Encounter: Admitting: Physical Therapy

## 2024-05-17 ENCOUNTER — Encounter: Admitting: Physical Therapy

## 2024-05-21 ENCOUNTER — Other Ambulatory Visit (HOSPITAL_COMMUNITY): Payer: Self-pay

## 2024-05-21 DIAGNOSIS — Z87891 Personal history of nicotine dependence: Secondary | ICD-10-CM

## 2024-05-21 NOTE — Therapy (Signed)
 OUTPATIENT PHYSICAL THERAPY PROSTHETIC TREATMENT   Patient Name: Mike Floyd MRN: [id number] DOB:1960-12-16, 63 y.o., male Today's Date: 05/22/2024  END OF SESSION:  PT End of Session - 05/22/24 1434     Visit Number 13    Number of Visits 25    Date for Recertification  05/31/24    Authorization Type [employer] MEDICARE    Authorization Time Period $45 copay    Progress Note Due on Visit 20    PT Start Time 1434    PT Stop Time 1516    PT Time Calculation (min) 42 min    Equipment Utilized During Treatment Gait belt    Activity Tolerance Patient tolerated treatment well;Patient limited by fatigue    Behavior During Therapy WFL for tasks assessed/performed               Past Medical History:  Diagnosis Date   AICD (automatic cardioverter/defibrillator) present 11/2007   Removed in 2018, a. 11/2007 SJM Current VR - single lead ICD  - Removed 2018 - it was burning me   Anxiety    CAD (coronary artery disease)    non-obstructive CAD by Cor CT in 2020 // Myoview  3/22: EF 57, small inf-sept defect c/w scar, no ischemia; low risk      Chest pain    a. 10/2007 Cath:  normal Cors.   CKD (chronic kidney disease), stage II    DDD (degenerative disc disease), lumbar    Diabetes mellitus DX: 2010   type 2   Erosive esophagitis    a. per EGD (08/2011), Dr. Golda - Erosive reflux esophagitis improved but not completely healed since previous EGD 3 years ago. Bx showing  ulcerated gatroesophageal junction mucosa. negative for H. pylori   GERD (gastroesophageal reflux disease)    Gout    Hearing deficit    a. wear bilateral hearing aides   History of hiatal hernia    History of kidney stones    passed stones, no surgery   Hypertension    Mildly dilatd aortic root (HCC)    CMR 4/22: EF 52, no LGE; d/w Dr. Ione root 38 mm (mildly dilated)   Myocardial infarction [facility]) 2011   Neuropathy    Feet and legs   Nonischemic dilated cardiomyopathy (HCC)    a. H/O EF as low as  35-40% by LV gram 10/2007;  b. Echo 02/2011 EF 50-55%, inf HK, Gr 1 DD // CMR 4/22: EF 52, no LGE; d/w Dr. Ione root 38 mm (mildly dilated)    Renal insufficiency    Sleep apnea    pt doesnt use, statesI cant afford one. PCP aware   Stroke (HCC)    mini-stroke in 2014   TIA (transient ischemic attack)    July, 2013   Tobacco abuse, in remission 06/27/2009   Discontinued in 2009     Wears dentures    top plate   WPW (Wolff-Parkinson-White syndrome)    a. s/p RFCA @ [facility] - 1999   Past Surgical History:  Procedure Laterality Date   AMPUTATION Left 05/19/2022   Procedure: TRANSMETATARSAL AMPUTATION LEFT FOOT;  Surgeon: Harden Jerona GAILS, MD;  Location: [facility] OR;  Service: Orthopedics;  Laterality: Left;   AMPUTATION Left 06/17/2023   Procedure: LEFT BELOW KNEE AMPUTATION;  Surgeon: Harden Jerona GAILS, MD;  Location: [facility] OR;  Service: Orthopedics;  Laterality: Left;   AMPUTATION Left 08/19/2023   Procedure: LEFT ABOVE KNEE AMPUTATION;  Surgeon: Harden Jerona GAILS, MD;  Location: [facility] OR;  Service: Orthopedics;  Laterality: Left;   APPENDECTOMY     BACK SURGERY     1995   CARDIAC CATHETERIZATION     2009   CARDIAC DEFIBRILLATOR PLACEMENT     ICD was removed in 2018   CHOLECYSTECTOMY     CHOLECYSTECTOMY, LAPAROSCOPIC     11/2007   COLONOSCOPY W/ POLYPECTOMY  2009   ELBOW SURGERY Left 06/2010   ESOPHAGEAL DILATION N/A 11/08/2014   Procedure: ESOPHAGEAL DILATION;  Surgeon: Claudis RAYMOND Rivet, MD;  Location: [facility] ORS;  Service: Endoscopy;  Laterality: N/A;  #56,    ESOPHAGOGASTRODUODENOSCOPY  03/31/2012   also 08/2011; [name]   ESOPHAGOGASTRODUODENOSCOPY (EGD) WITH PROPOFOL  N/A 11/08/2014   Procedure: ESOPHAGOGASTRODUODENOSCOPY (EGD) WITH PROPOFOL ;  Surgeon: Claudis RAYMOND Rivet, MD;  Location: [facility] ORS;  Service: Endoscopy;  Laterality: N/A;  Hiatus is 52 , GE Junction is 37   FOOT ARTHRODESIS Left 10/24/2020   Procedure: LEFT GASTROCNEMIUS RECESSION, DORSIFLEXION OSTEOTOMY 1ST MT;  Surgeon: Harden Jerona GAILS,  MD;  Location: [facility] OR;  Service: Orthopedics;  Laterality: Left;   FOOT ARTHRODESIS Right 01/09/2021   Procedure: CLOSING WEDGE OSTEOTOMY RIGHT 1ST METATARSAL;  Surgeon: Harden Jerona GAILS, MD;  Location: [facility] OR;  Service: Orthopedics;  Laterality: Right;   GASTROCNEMIUS RECESSION Right 01/09/2021   Procedure: RIGHT GASTROCNEMIUS RECESSION;  Surgeon: Harden Jerona GAILS, MD;  Location: [facility] OR;  Service: Orthopedics;  Laterality: Right;   ICD LEAD REMOVAL N/A 10/07/2016   Procedure: ICD LEAD REMOVAL ;  Surgeon: Danelle LELON Birmingham, MD;  Location: [facility] OR;  Service: Cardiovascular;  Laterality: N/A;   MULTIPLE TOOTH EXTRACTIONS     RADIOFREQUENCY ABLATION  1999   WPW; performed at [facility]   STUMP REVISION Left 07/22/2023   Procedure: REVISION LEFT BELOW KNEE AMPUTATION;  Surgeon: Harden Jerona GAILS, MD;  Location: [facility] OR;  Service: Orthopedics;  Laterality: Left;   TEE WITHOUT CARDIOVERSION N/A 10/07/2016   Procedure: TRANSESOPHAGEAL ECHOCARDIOGRAM (TEE);  Surgeon: Danelle LELON Birmingham, MD;  Location: [facility] OR;  Service: Cardiovascular;  Laterality: N/A;   Patient Active Problem List   Diagnosis Date Noted   Dyslipidemia 11/03/2023   Essential hypertension 11/03/2023   Type 2 diabetes mellitus with peripheral neuropathy (HCC) 11/03/2023   BPH (benign prostatic hyperplasia) 11/03/2023   GERD without esophagitis 11/03/2023   S/P AKA (above knee amputation) unilateral, left (HCC) 08/19/2023   Dehiscence of amputation stump of left lower extremity (HCC) 07/22/2023   Ischemia of site of left below knee amputation (HCC) 07/22/2023   S/P BKA (below knee amputation) unilateral, left (HCC) 07/22/2023   Below-knee amputation of left lower extremity (HCC) 06/17/2023   Acute osteomyelitis of metatarsal bone of left foot (HCC)    Subacute osteomyelitis, left ankle and foot (HCC) 05/13/2022   Non-pressure chronic ulcer of other part of right foot limited to breakdown of skin (HCC)    Contracture of right Achilles tendon    Mildly  dilatd aortic root (HCC)    Contracture of left Achilles tendon    Metatarsal deformity, left    CAD (coronary artery disease)    Hallux hammertoe, left 12/11/2019   Diarrhea 06/27/2019   Diastasis recti 08/22/2018   Umbilical hernia without obstruction and without gangrene 08/22/2018   Vitamin D  deficiency 06/23/2017   Essential hypertension, benign 06/15/2017   Mixed hyperlipidemia 06/15/2017   Class 1 obesity due to excess calories with serious comorbidity and body mass index (BMI) of 31.0 to 31.9 in adult 06/15/2017   Malfunction of implantable cardioverter-defibrillator (  ICD) electrode 10/07/2016   Chronic kidney disease (CKD), stage III (moderate) (HCC) 04/04/2015   Bladder neck obstruction 03/21/2015   Difficult or painful urination 03/21/2015   Flank pain 03/21/2015   Delayed onset of urination 03/21/2015   Calculus of kidney 03/21/2015   Erosive esophagitis 12/27/2013   Hypotension due to drugs 07/02/2013   Chronic systolic heart failure (HCC) 04/24/2013   CAP (community acquired pneumonia) 03/16/2013   HTN (hypertension) 03/15/2013   Hydrocele 03/15/2013   Spermatocele 03/15/2013   DOE (dyspnea on exertion) 03/15/2013   OSA (obstructive sleep apnea) 01/09/2013   Chronic kidney disease, stage 3 (HCC) 01/03/2013   Chest pain 12/26/2012   History of diagnostic tests 09/06/2012   TIA (transient ischemic attack)    Cardiomyopathy, nonischemic (HCC) 02/24/2010   AICD (automatic cardioverter/defibrillator) 02/24/2010   DM type 2 causing vascular disease (HCC) 06/27/2009   Gout 06/27/2009   Tobacco abuse, in remission 06/27/2009   COLONIC POLYPS 10/31/2008   GERD (gastroesophageal reflux disease) 10/31/2008    PCP: Shona Norleen PEDLAR, MD  REFERRING PROVIDER: Harden Jerona GAILS, MD  REFERRING DIAG: [id number] (ICD-10-CM) - Hx of AKA (above knee amputation), left (HCC)   THERAPY DIAG:  Other abnormalities of gait and mobility  Muscle weakness (generalized)  Stiffness of left  hip, not elsewhere classified  Abnormal posture  Unsteadiness on feet  Rationale for Evaluation and Treatment: Rehabilitation  ONSET DATE: Prosthesis delivery 02/20/2024  SUBJECTIVE:   SUBJECTIVE STATEMENT:   Patient endorsing one fall in the bed of his truck while trying to remove a chair at the dump and required assistance from 2 individuals in order to return to standing. Patient also reports having climbed a ladder and having his prosthesis fall off while on the ladder. Lastly, patient notes increased pain in Rt shin that reached a 9/10 over the weekend prior to session, though has decreased to 3-4/10.    PERTINENT HISTORY: DM2, GERD, hypertension, dyslipidemia, CAD, L BKA (06/17/23), neuropathy  Eval: Patient is a 63 year old male who presents to physical therapy with a left AKA (08/19/2023) with a new prosthesis delivery on 02/20/2024. Patient had previous home health PT prior to Lt AKA. He is able to mobilize with modified independence w/ walker. Wears prosthesis about ten hours a day and by the end of the night he takes it off due to fatigue and pain.   PAIN:  Are you having pain? Yes: NPRS scale: 3-4/10 Pain location: Femur in prosthesis and pain in the Rt leg as well Pain description: feels like its been hammered Aggravating factors: keeping prosthesis on for long periods of times, sitting Relieving factors: taking off prosthesis and medications  PRECAUTIONS: Fall  RED FLAGS: None   WEIGHT BEARING RESTRICTIONS: No  FALLS:  Has patient fallen in last 6 months? Yes. Number of falls 2, forgetting he doesn't have a leg and put weight onto the left side. He did not hit the floor, only fell into another person. Occurred when transferring from sitting to standing  LIVING ENVIRONMENT: Lives with: lives with their spouse Lives in: Other Trailer on grass Stairs: Yes: External: 4 steps; can reach both Has following equipment at home: Single point cane, Environmental consultant - 2 wheeled,  Wheelchair (manual), and shower chair  OCCUPATION: retired   PLOF: Independent with household mobility with device  PATIENT GOALS: ambulating and ADLs with cane   NEXT MD VISIT: Office MD visit: 03/21/2024   OBJECTIVE:  Patient-Specific Activity Scoring Scheme  0 represents "unable to perform." 10 represents "  able to perform at prior level. 0 1 2 3 4 5 6 7 8 9  10 (Date and Score)   Activity Eval     1. Walking w/ cane  2    2. Standing for ADLs  5    3. Stairs 5   4.    5.    Score 4    Total score = sum of the activity scores/number of activities Minimum detectable change (90%CI) for average score = 2 points Minimum detectable change (90%CI) for single activity score = 3 points  DIAGNOSTIC FINDINGS: none post-amputation  COGNITION: Overall cognitive status: Within functional limits for tasks assessed   SENSATION: WFL  MUSCLE LENGTH:   POSTURE: rounded shoulders, forward head, flexed trunk , and weight shift right  LOWER EXTREMITY ROM:  ROM P:passive  A:active Left eval  Hip flexion   Hip extension Thomas position: P: - 40*  Hip abduction   Hip adduction   Hip internal rotation   Hip external rotation   Knee flexion   Knee extension   Ankle dorsiflexion   Ankle plantarflexion   Ankle inversion   Ankle eversion    (Blank rows = not tested)  LOWER EXTREMITY MMT:  MMT Right eval Left eval  Hip flexion 3/5 3/5  Hip extension 3/5 3-/5  Hip abduction  3-/5  Hip adduction    Hip internal rotation    Hip external rotation    Knee flexion    Knee extension 3/5   Ankle dorsiflexion    Ankle plantarflexion    Ankle inversion    Ankle eversion    At Evaluation all strength testing is grossly seated and functionally standing / gait. (Blank rows = not tested)  BED MOBILITY:  Sit to supine Complete Independence Supine to sit Complete Independence Rolling to Right Complete Independence  TRANSFERS: 04/10/2024: Pt able stand to/from sit into  chair without armrest using BUEs on seat and RW for stabilization with supervision/verbal cues.  Eval: Sit to stand: supervision requires a stable object to arise, stabilization w/ walker using UE support, with delayed weight shift onto the prosthesis for stabilization, and uses UE to bring Lt prosthesis back below his body  Stand to sit: supervision requires a stable object to arise, stabilization w/ walker using UE support, with delayed weight shift onto the prosthesis for stabilization  FUNCTIONAL TESTs:  04/10/2024: Patient able to reach 10 forward and look over his shoulders with single upper extremity support on RW.   Eval:  BERG score 11/56  GAIT: 04/10/2024: Patient ambulated 125' with RW and supervision.   Patient negotiated 12*ramp and 6.5 curb with RW and supervision  Eval: Gait pattern: step to pattern, decreased step length- Right, decreased stance time- Left, decreased hip/knee flexion- Left, circumduction- Left, Left hip hike, antalgic, trendelenburg, lateral lean- Right, trunk flexed, abducted- Left, and poor foot clearance- Left Distance walked: 62 ft Assistive device utilized: Walker - 2 wheeled and AKA prosthesis Level of assistance: SBA / steady with RW support but needs cues  Gait velocity: 0.72 ft/sec Comments: excessive weightbearing on BUEs using RW  RAMP : not tested  CURB: not tested  CURRENT PROSTHETIC WEAR ASSESSMENT:   Patient is dependent with: skin check, residual limb care, care of non-amputated limb, prosthetic cleaning, ply sock cleaning, correct ply sock adjustment, proper wear schedule/adjustment, and proper weight-bearing schedule/adjustment Donning prosthesis: SBA / verbal cues Doffing prosthesis: Complete Independence Prosthetic wear tolerance: 6-8 hours, 1x/day, 7 days/week Prosthetic weight bearing tolerance: tolerated standing for  10 minutes with partial weight on prosthesis during BERG balance test with no increased pain. Although he reports  standing pain with activities at home. Edema: minimal Residual limb condition: 3x28mm superficial wound distally located, scar with mild adherence, no adductor roll, normal skin color, decreased hair growth present Prosthetic description: silicone liner with suction ring suspension, ischial containment socket with flexible inner socket, rotator unit, SAFETY (single axis friction engaging with an extension assist), flexible keel foot  TODAY'S TREATMENT:                                                                                                                    DATE: 05/22/2024 Prosthetic Training with Transfemoral Prosthesis PT discussed prosthesis wear time with patient as patient endorses intermittent difficulty secondary to sensation of tightness and feeling like it is going to explode. Patient notes ability to wear for 6-7 hours 2x/day on some days, though on difficult days he can only don for 2-3 hours at a time. Patient endorses sensation of ice cold and pale skin on residual limb when doffing prosthesis and liner that can last up to 40-45 minutes and will improve when covering it up with a blanket to rewarm.  Patient ambulated 1x40' with RW and SBA-SUP from PT for reassessment following 3-week break from PT. Patient performed with increased Lt abduction, intermittent Lt toe drag during swing phase, and Lt femoral ER throughout.  Patient then performed 1x40' with SPC with quad tip and CGA from PT. Patient did not have any moments of instability with forward ambulation, though continued to have deviations that were noted with RW.  PT discussed ladder safety and appropriate form when ascending and descending ladder with patient and spouse. Patient and spouse acknowledged and agreed.  PT discussed follow up with MD in order to address high levels of pain in intact limb as patient endorses sensations similar to Lt LE when it had the infection that began the amputation process. Patient and spouse  acknowledged and agreed.   Neuro Re-Ed:  PT discussed balance changes that occur with a prosthesis and the decrease of proprioceptive feedback from LE musculature. PT educated on how patient will continue to receive feedback from residual limb, however, it takes time for body to learn from residual limb and it is a different sort of input compared to typical. Patient acknowledged. Patient performed standing punch with blue TB to challenge standing balance with equal weightbearing with CGA from PT throughout. Patient endorsed ease of performance with blue TB which then PT discontinued. Patient then performed reactive balance while walking fwd/backward with bilat handles on cable machine each at 10# each and CGA-max A throughout. Max A required one time with Rt LOB during backward ambulation toward machine. PT discontinued following third attempt.     TODAY'S TREATMENT:  DATE: 04/26/2024 Prosthetic Training with Transfemoral Prosthesis  Patient performed several sit<>stands throughout session with SUP>SBA using both RW and SPC with quad tip. Patient able to verbalize appropriate placement of SPC with quad tip during sit<>stands. Patient ambulating throughout session with SPC with quad tip and CGA for 51'9, 20', and 66'. Patient performing with improved forward gaze this date but continues to have forward lean and thoracic kyphosis. PT discussed and educated patient on increased energy expenditure post-amputation as well as ambulating with SPC vs RW.   Neuro Re-Ed:  Patient performed standing taps to light up pods on window to challenge static balance, reaching outside of BOS, and reaction time. Patient performing 30s of each activity with CGA and no required increased physical assist or overt LOB.  Normal stance, narrow stance, semi-tandem stance with Rt LE back, semi-tandem with Lt LE  back, semi-tandem with Lt LE back using only Rt hand, and semi-tandem Lt LE back using only Lt hand  Patient performing tossing and catching for 4x30s in parallel bars with CGA throughout while standing in typical stance, narrow stance, and 2x in semi-tandem with Lt LE back. Intermittent posterolateral LOB with need for increase in physical assist to at least mod A, though patient able to use bilat UE on parallel bars.    TODAY'S TREATMENT:                                                                                                                    DATE: 04/23/2024 Prosthetic Training with Transfemoral Prosthesis  4 Square stepping pattern activity with cane and minA/CGA.  Patient had difficulty stepping in all directions due to various things such as long step length, instability, and putting weight on his prosthesis before locking out the knee. PT demo & verbal cues on safe environment to challenge balance with 4-square stepping but able to catch himself with intermittent touch on chair backs or RW enabling him to perform without PT assist.  PT verbally educated and demoed how to negotiate 4 inch curb with cane.  Patient verbalized and demoed understanding.  Patient negotiated 4 inch curb with HHA and cane 3 reps.  Verbal cues needed for cane gait pattern .  Progressed to Patient negotiated 4 inch curb with cane and minA / CGA with close position for HHA stabilization if needed (but able to perform without touching hand).  Verbal cues needed for cane gait pattern reminders Patient ambulated  42' with open water  bottle, cane, and CGA.  Before initiating walk patient had to reach for water  bottle on desk which caused him to lose balance and required maxA to recover. After ambulation, patient reached to put water  bottle down with no spills.  Pt reported that he found this more challenging than expected.  Patient negotiated stairs with step to pattern with single rail support demo'ing what he is currently  doing at home.  Patient negotiated stairs with step to pattern with single rail and cane support. Patient felt more stable this way and appeared  safer.   TODAY'S TREATMENT:                                                                                                                    DATE: 04/16/2024 Prosthetic Training with Transfemoral Prosthesis  PT verbally educated patient about using ply socks appropriately including donning. Trialed different ply sock sizes and ambulated to feel appropriate fit.  Patient verbalized and demoed understanding Patient ambulated 76' with cane and CGA and performed a stand to sit into chair with no armrests.  Patient fatigued after exercise Patient ambulated 25' with cane and CGA.  Patient had a loss of balance which required maxA x2 to recover from to prevent a fall.  Patient ambulated 71' with cane and CGA.  Minimal loss of balance when transferring from cane to RW  Neuro Re-Ed Following exercises were done with blue Thera-Band to challenge standing balance Single arm row with each arm independently and bilateral rows, 5x Single arm forward punch with each arm independently and bilateral punches, 5x Single bicep curl plus shoulder flexion with each arm independently and bilateral bicep curl plus shoulder flexion, 5x   TREATMENT:                                                                                                                             DATE: 04/12/2024 Prosthetic Training with Transfemoral Prosthesis  PT discussing wear time with transfemoral prosthesis. Patient noting increasing time to 2x6 hours with 2 hour break as of yesterday, but described day as more of 8 hours on, 30 minutes off, and 8 hours on before removing prosthesis for the night. Patient does endorse removing prosthesis for about 2 hours occasionally, however, does not like to sit still for long periods of time and does not want to perform mobility with RW without prosthesis. PT  reminding patient to give himself enough breaks built in for sweat checks, drying residual limb and prosthesis, and for no overuse. Patient endorsing completing dry time breaks within his day for about 5-10 minutes a day.  PT discussed ply socks with patient and their importance especially over the next few months before he receives a new socket as his residual limb is going to change shape and the ply socks will assist with appropriate fit. Patient endorses returning to clinic after session to make next appointment. PT reminded patient to bring ply socks to next session if he were to receive them while visiting the clinic to make  his next appointment with the prosthetist. Reassessed height of RW as patient ambulated with increased trunk flexion and thoracic kyphosis at beginning of session. PT increased height of RW to appropriate height by two settings in order to decrease postural compensations; patient performing with improved posture after adjustments. Patient performing several sit<>stands from standard arm chair with close SBA with intermittent difficulty bending prosthesis when sitting. No required increased physical assist from PT or uncontrolled movements. Patient performing household distance ambulation, ~2x30', with RW and close SBA with noted very large Lt step lengths. Patient will benefit from gait training to equalize step lengths and stance time.  Neuro Re-Ed:  Patient performing multiple balance activities this date with CGA-minA while standing in a corner and having a chair in front of him for safety and confidence.  2x30s with narrow BOS; increased sway noted but no need for increased physical assistance from PT  2x30s with semi tandem stance; increased sway noted with minimal instances of posterior LOB; patient able to catch himself on wall and self-correct back into starting stance  2x30s with narrow BOS and horizontal head turns; patient requiring increased assist to minA for lateral  and posterior LOB that was worse with Lt head turn  2x30s with narrow BOS and vertical head turns; patient experiencing increased sway and posterior LOB with patient able to catch on wall and self-correct back into starting stance  1x30s with narrow BOS and eyes closed; patient experiencing fear and few instances of LOB (posterior, anterior, and lateral) with only one instance of requiring increased physical assist from PT to return to starting stance PT discussed with patient about not beginning these exercises at home yet due to busy home life and plenty of exercises already on HEP, but to expect that they will eventually be added.   PATIENT EDUCATION: PATIENT EDUCATED ON FOLLOWING PROSTHETIC CARE: Education details: Sitting positioning to limit skin traction from weight of prosthesis, how hip flexion contracture effects residual limb pain & posture,  Prosthetic cleaning and Propper donning  Person educated: Patient, Spouse, and close family friend Education method: Explanation, Demonstration, Tactile cues, and Verbal cues Education comprehension: verbalized understanding and needs further education  HOME EXERCISE PROGRAM: Access Code: [email or web address] URL: [email or web address] Date: 03/20/2024 Prepared by: Grayce Spatz  Exercises - Modified Thomas Stretch  - 2 x daily - 7 x weekly - 1 sets - 2-3 reps - 30 seconds hold - Hip Flexor Stretch at Edge of Bed  - 2 x daily - 7 x weekly - 1 sets - 2-3 reps - 30 seconds hold - Standing posture with back to counter  - 2 x daily - 7 x weekly - 1 sets - 1 reps - 5-10 minutes hold - Upright Stance at Door Frame Single Arm  - 3-6 x daily - 7 x weekly - 1 sets - 2 reps - 2 deep breathes hold - Upright Stance at Door Frame with Both Arms  - 3-6 x daily - 7 x weekly - 1 sets - 2 reps - 2 deep breathes hold  Do each exercise 1-2  times per day Do each exercise 5-10 repetitions Hold each exercise for 2 seconds to feel your location  AT SINK  FIND YOUR MIDLINE POSITION AND PLACE FEET EQUAL DISTANCE FROM THE MIDLINE.  Try to find this position when standing still for activities.   USE TAPE ON FLOOR TO MARK THE MIDLINE POSITION which is even with middle of sink.  You also should try to feel with your  limb pressure in socket.  You are trying to feel with limb what you used to feel with the bottom of your foot.  Side to Side Shift: Moving your hips only (not shoulders): move weight onto your left leg, HOLD/FEEL pressure in socket.  Move back to equal weight on each leg, HOLD/FEEL pressure in socket. Move weight onto your right leg, HOLD/FEEL pressure in socket. Move back to equal weight on each leg, HOLD/FEEL pressure in socket. Repeat.  Start with both hands on sink, progress to hand on prosthetic side only, then no hands.  Front to Back Shift: Moving your hips only (not shoulders): move your weight forward onto your toes, HOLD/FEEL pressure in socket. Move your weight back to equal Flat Foot on both legs, HOLD/FEEL  pressure in socket. Move your weight back onto your heels, HOLD/FEEL  pressure in socket. Move your weight back to equal on both legs, HOLD/FEEL  pressure in socket. Repeat.  Start with both hands on sink, progress to hand on prosthetic side only, then no hands.  Moving Cones / Cups: With equal weight on each leg: Hold on with one hand the first time, then progress to no hand supports. Move cups from one side of sink to the other. Place cups ~2" out of your reach, progress to 10" beyond reach.  Place one hand in middle of sink and reach with other hand. Do both arms.  Then hover one hand and move cups with other hand.  Overhead/Upward Reaching: alternated reaching up to top cabinets or ceiling if no cabinets present. Keep equal weight on each leg. Start with one hand support on counter while other hand reaches and progress to no hand support with reaching.  ace one hand in middle of sink and reach with other hand. Do both arms.  Then  hover one hand and move cups with other hand.  5.   Looking Over Shoulders: With equal weight on each leg: alternate turning to look over your shoulders with one hand support on counter as needed.  Start with head motions only to look in front of shoulder, then even with shoulder and progress to looking behind you. To look to side, move head /eyes, then shoulder on side looking pulls back, shift more weight to side looking and pull hip back. Place one hand in middle of sink and let go with other hand so your shoulder can pull back. Switch hands to look other way.   Then hover one hand and look over shoulder. If looking right, use left hand at sink. If looking left, use right hand at sink. 6.  Stepping with leg that is not amputated:  Move items under cabinet out of your way. Shift your hips/pelvis so weight on prosthesis. Tighten muscles in hip on prosthetic side.  SLOWLY step other leg so front of foot is in cabinet. Then step back to floor.    ASSESSMENT:  CLINICAL IMPRESSION:    Patient arrived to session noting improved personal life schedule and has been able to perform activities at home, however, has not been ambulating with SPC outside of PT. Patient endorses one fall and one incident involving prosthesis that he was able to recover from with assistance. Patient endorses pain in Rt shin with PT discussing importance of being cleared by MD. Patient tolerated all activities with only one major LOB during reactive balance activity. Patient will continue to benefit from skilled PT.  OBJECTIVE IMPAIRMENTS: Abnormal gait, decreased activity tolerance, decreased balance, decreased endurance, decreased  knowledge of condition, decreased knowledge of use of DME, decreased mobility, difficulty walking, decreased ROM, decreased strength, impaired flexibility, postural dysfunction, prosthetic dependency , and pain.   ACTIVITY LIMITATIONS: carrying, lifting, bending, sitting, standing, squatting, stairs,  transfers, bathing, toileting, dressing, reach over head, and locomotion level  PARTICIPATION LIMITATIONS: meal prep, cleaning, laundry, driving, shopping, community activity, and yard work  PERSONAL FACTORS: Age, Fitness, Past/current experiences, Time since onset of injury/illness/exacerbation, and 3+ comorbidities: see PMH are also affecting patient's functional outcome.   REHAB POTENTIAL: Good  CLINICAL DECISION MAKING: Evolving/moderate complexity  EVALUATION COMPLEXITY: Moderate   GOALS: Goals reviewed with patient? Yes  SHORT TERM GOALS: Target date: 04/05/2024  Patient donnes prosthesis modified independent & verbalizes proper cleaning. Baseline: SEE OBJECTIVE DATA Goal status: MET, 04/10/2024  2.  Patient tolerates prosthesis >12 hrs total /day without skin issues or limb pain >7/10 after standing. Baseline: patient reports wearing 4-5 hr/day Goal status: NOT MET, 04/10/2024  3.  Patient able to reach 7 and look over both shoulders with RW support with Modified independent. Baseline: SEE OBJECTIVE DATA Goal status: MET, 04/10/2024  4. Patient ambulates 125' with RW & prosthesis with supervision. Baseline: SEE OBJECTIVE DATA Goal status: MET, 04/10/2024  5. Patient negotiates ramps & curbs with RW & prosthesis with supervision. Baseline: SEE OBJECTIVE DATA Goal status: MET, 04/10/2024  LONG TERM GOALS: Target date: 05/31/2024  Patient demonstrates & verbalized understanding of prosthetic care to enable safe utilization of prosthesis. Baseline: SEE OBJECTIVE DATA Goal status: Ongoing   03/20/2024  Patient tolerates prosthesis wear >90% of awake hours without skin issues or limb pain >4/10. Baseline: SEE OBJECTIVE DATA Goal status: Ongoing   03/20/2024  BERG >/= 25/56 to indicate lower fall risk Baseline: SEE OBJECTIVE DATA Goal status: Ongoing   03/20/2024  Patient ambulates >300' with prosthesis and LRAD independently Baseline: SEE OBJECTIVE DATA Goal status: Ongoing    03/20/2024  Patient negotiates ramps, curbs & stairs with single rail with prosthesis and LRAD independently. Baseline: SEE OBJECTIVE DATA Goal status: Ongoing   03/20/2024  6.  Patient reports Patient-Specific Activity Score improved the average by 3 to indicate improvement in functional activities.   Baseline: SEE OBJECTIVE DATA Goal status: Ongoing   03/20/2024  PLAN:  PT FREQUENCY: 2x/week  PT DURATION: 12 weeks  PLANNED INTERVENTIONS: 97164- PT Re-evaluation, 97750- Physical Performance Testing, 97110-Therapeutic exercises, 97530- Therapeutic activity, [id number]- Neuromuscular re-education, 97535- Self Care, [id number]- Manual therapy, [phone]- Gait training, [phone]- Prosthetic Initial , [phone]- Orthotic/Prosthetic subsequent, Patient/Family education, Balance training, Stair training, Joint mobilization, and DME instructions  PLAN FOR NEXT SESSION: getting up off the floor after a fall,  objective measure of 4 square step test and gait velocity, challenge step length consistency, ambulation with cane including curb.    Susannah Daring, PT, DPT 05/22/24 4:24 PM     Date of referral: 02/13/2024 Referring provider: Harden Jerona GAILS, MD Referring diagnosis? S10.387 (ICD-10-CM) - Hx of AKA (above knee amputation), left (HCC)  Treatment diagnosis? (if different than referring diagnosis) R26.89, M62.81, M79.605, M25.652, R29.3, R26.81, L98.498  What was this (referring dx) caused by? Other: Lt AKA  Nature of Condition: Initial Onset (within last 3 months)   Laterality: Lt  Current Functional Measure Score: Patient Specific Functional Scale Score:4  Objective measurements identify impairments when they are compared to normal values, the uninvolved extremity, and prior level of function.  [x]  Yes  []  No  Objective assessment of functional ability: Moderate functional limitations   Briefly describe symptoms: Patient  is dependent in prosthetic care and use with high risk of falls. Gait deviation,  muscle weakness, pain in Lt residual limb, hip flexion contracture.  How did symptoms start: amputation due to vascular issues  Average pain intensity:  Last 24 hours: 8-9/10  Past week: 8-9/10  How often does the pt experience symptoms? Constantly  How much have the symptoms interfered with usual daily activities? Quite a bit  How has condition changed since care began at this facility? NA - initial visit  In general, how is the patients overall health? Good   BACK PAIN (STarT Back Screening Tool) No

## 2024-05-22 ENCOUNTER — Ambulatory Visit

## 2024-05-22 DIAGNOSIS — M25652 Stiffness of left hip, not elsewhere classified: Secondary | ICD-10-CM

## 2024-05-22 DIAGNOSIS — M6281 Muscle weakness (generalized): Secondary | ICD-10-CM

## 2024-05-22 DIAGNOSIS — R293 Abnormal posture: Secondary | ICD-10-CM | POA: Diagnosis not present

## 2024-05-22 DIAGNOSIS — R2681 Unsteadiness on feet: Secondary | ICD-10-CM

## 2024-05-22 DIAGNOSIS — R2689 Other abnormalities of gait and mobility: Secondary | ICD-10-CM | POA: Diagnosis not present

## 2024-05-23 NOTE — Therapy (Signed)
 OUTPATIENT PHYSICAL THERAPY PROSTHETIC TREATMENT   Patient Name: Mike Floyd MRN: [id number] DOB:10-20-1960, 63 y.o., male Today's Date: 05/24/2024  END OF SESSION:  PT End of Session - 05/24/24 1433     Visit Number 14    Number of Visits 25    Date for Recertification  05/31/24    Authorization Type [employer] MEDICARE    Authorization Time Period $45 copay    Progress Note Due on Visit 20    PT Start Time 1433    PT Stop Time 1500    PT Time Calculation (min) 27 min    Equipment Utilized During Treatment Gait belt    Activity Tolerance Patient tolerated treatment well;Patient limited by fatigue    Behavior During Therapy WFL for tasks assessed/performed            Past Medical History:  Diagnosis Date   AICD (automatic cardioverter/defibrillator) present 11/2007   Removed in 2018, a. 11/2007 S[facility] Current VR - single lead ICD  - Removed 2018 - it was burning me   Anxiety    CAD (coronary artery disease)    non-obstructive CAD by Cor CT in 2020 // Myoview  3/22: EF 57, small inf-sept defect c/w scar, no ischemia; low risk      Chest pain    a. 10/2007 Cath:  normal Cors.   CKD (chronic kidney disease), stage II    DDD (degenerative disc disease), lumbar    Diabetes mellitus DX: 2010   type 2   Erosive esophagitis    a. per EGD (08/2011), Dr. Golda - Erosive reflux esophagitis improved but not completely healed since previous EGD 3 years ago. Bx showing  ulcerated gatroesophageal junction mucosa. negative for H. pylori   GERD (gastroesophageal reflux disease)    Gout    Hearing deficit    a. wear bilateral hearing aides   History of hiatal hernia    History of kidney stones    passed stones, no surgery   Hypertension    Mildly dilatd aortic root (HCC)    CMR 4/22: EF 52, no LGE; d/w Dr. Ione root 38 mm (mildly dilated)   Myocardial infarction [facility]) 2011   Neuropathy    Feet and legs   Nonischemic dilated cardiomyopathy (HCC)    a. H/O EF as low as 35-40% by  LV gram 10/2007;  b. Echo 02/2011 EF 50-55%, inf HK, Gr 1 DD // CMR 4/22: EF 52, no LGE; d/w Dr. Ione root 38 mm (mildly dilated)    Renal insufficiency    Sleep apnea    pt doesnt use, statesI cant afford one. PCP aware   Stroke (HCC)    mini-stroke in 2014   TIA (transient ischemic attack)    July, 2013   Tobacco abuse, in remission 06/27/2009   Discontinued in 2009     Wears dentures    top plate   WPW (Wolff-Parkinson-White syndrome)    a. s/p RFCA @ [facility] - 1999   Past Surgical History:  Procedure Laterality Date   AMPUTATION Left 05/19/2022   Procedure: TRANSMETATARSAL AMPUTATION LEFT FOOT;  Surgeon: Harden Jerona GAILS, MD;  Location: [facility] OR;  Service: Orthopedics;  Laterality: Left;   AMPUTATION Left 06/17/2023   Procedure: LEFT BELOW KNEE AMPUTATION;  Surgeon: Harden Jerona GAILS, MD;  Location: [facility] OR;  Service: Orthopedics;  Laterality: Left;   AMPUTATION Left 08/19/2023   Procedure: LEFT ABOVE KNEE AMPUTATION;  Surgeon: Harden Jerona GAILS, MD;  Location: [facility] OR;  Service: Orthopedics;  Laterality: Left;   APPENDECTOMY     BACK SURGERY     1995   CARDIAC CATHETERIZATION     2009   CARDIAC DEFIBRILLATOR PLACEMENT     ICD was removed in 2018   CHOLECYSTECTOMY     CHOLECYSTECTOMY, LAPAROSCOPIC     11/2007   COLONOSCOPY W/ POLYPECTOMY  2009   ELBOW SURGERY Left 06/2010   ESOPHAGEAL DILATION N/A 11/08/2014   Procedure: ESOPHAGEAL DILATION;  Surgeon: Claudis RAYMOND Rivet, MD;  Location: [facility] ORS;  Service: Endoscopy;  Laterality: N/A;  #56,    ESOPHAGOGASTRODUODENOSCOPY  03/31/2012   also 08/2011; [name]   ESOPHAGOGASTRODUODENOSCOPY (EGD) WITH PROPOFOL  N/A 11/08/2014   Procedure: ESOPHAGOGASTRODUODENOSCOPY (EGD) WITH PROPOFOL ;  Surgeon: Claudis RAYMOND Rivet, MD;  Location: [facility] ORS;  Service: Endoscopy;  Laterality: N/A;  Hiatus is 31 , GE Junction is 37   FOOT ARTHRODESIS Left 10/24/2020   Procedure: LEFT GASTROCNEMIUS RECESSION, DORSIFLEXION OSTEOTOMY 1ST MT;  Surgeon: Harden Jerona GAILS, MD;   Location: [facility] OR;  Service: Orthopedics;  Laterality: Left;   FOOT ARTHRODESIS Right 01/09/2021   Procedure: CLOSING WEDGE OSTEOTOMY RIGHT 1ST METATARSAL;  Surgeon: Harden Jerona GAILS, MD;  Location: [facility] OR;  Service: Orthopedics;  Laterality: Right;   GASTROCNEMIUS RECESSION Right 01/09/2021   Procedure: RIGHT GASTROCNEMIUS RECESSION;  Surgeon: Harden Jerona GAILS, MD;  Location: [facility] OR;  Service: Orthopedics;  Laterality: Right;   ICD LEAD REMOVAL N/A 10/07/2016   Procedure: ICD LEAD REMOVAL ;  Surgeon: Danelle LELON Birmingham, MD;  Location: [facility] OR;  Service: Cardiovascular;  Laterality: N/A;   MULTIPLE TOOTH EXTRACTIONS     RADIOFREQUENCY ABLATION  1999   WPW; performed at [facility]   STUMP REVISION Left 07/22/2023   Procedure: REVISION LEFT BELOW KNEE AMPUTATION;  Surgeon: Harden Jerona GAILS, MD;  Location: [facility] OR;  Service: Orthopedics;  Laterality: Left;   TEE WITHOUT CARDIOVERSION N/A 10/07/2016   Procedure: TRANSESOPHAGEAL ECHOCARDIOGRAM (TEE);  Surgeon: Danelle LELON Birmingham, MD;  Location: [facility] OR;  Service: Cardiovascular;  Laterality: N/A;   Patient Active Problem List   Diagnosis Date Noted   Dyslipidemia 11/03/2023   Essential hypertension 11/03/2023   Type 2 diabetes mellitus with peripheral neuropathy (HCC) 11/03/2023   BPH (benign prostatic hyperplasia) 11/03/2023   GERD without esophagitis 11/03/2023   S/P AKA (above knee amputation) unilateral, left (HCC) 08/19/2023   Dehiscence of amputation stump of left lower extremity (HCC) 07/22/2023   Ischemia of site of left below knee amputation (HCC) 07/22/2023   S/P BKA (below knee amputation) unilateral, left (HCC) 07/22/2023   Below-knee amputation of left lower extremity (HCC) 06/17/2023   Acute osteomyelitis of metatarsal bone of left foot (HCC)    Subacute osteomyelitis, left ankle and foot (HCC) 05/13/2022   Non-pressure chronic ulcer of other part of right foot limited to breakdown of skin (HCC)    Contracture of right Achilles tendon    Mildly  dilatd aortic root (HCC)    Contracture of left Achilles tendon    Metatarsal deformity, left    CAD (coronary artery disease)    Hallux hammertoe, left 12/11/2019   Diarrhea 06/27/2019   Diastasis recti 08/22/2018   Umbilical hernia without obstruction and without gangrene 08/22/2018   Vitamin D  deficiency 06/23/2017   Essential hypertension, benign 06/15/2017   Mixed hyperlipidemia 06/15/2017   Class 1 obesity due to excess calories with serious comorbidity and body mass index (BMI) of 31.0 to 31.9 in adult 06/15/2017   Malfunction of implantable cardioverter-defibrillator (ICD) electrode 10/07/2016  Chronic kidney disease (CKD), stage III (moderate) (HCC) 04/04/2015   Bladder neck obstruction 03/21/2015   Difficult or painful urination 03/21/2015   Flank pain 03/21/2015   Delayed onset of urination 03/21/2015   Calculus of kidney 03/21/2015   Erosive esophagitis 12/27/2013   Hypotension due to drugs 07/02/2013   Chronic systolic heart failure (HCC) 04/24/2013   CAP (community acquired pneumonia) 03/16/2013   HTN (hypertension) 03/15/2013   Hydrocele 03/15/2013   Spermatocele 03/15/2013   DOE (dyspnea on exertion) 03/15/2013   OSA (obstructive sleep apnea) 01/09/2013   Chronic kidney disease, stage 3 (HCC) 01/03/2013   Chest pain 12/26/2012   History of diagnostic tests 09/06/2012   TIA (transient ischemic attack)    Cardiomyopathy, nonischemic (HCC) 02/24/2010   AICD (automatic cardioverter/defibrillator) 02/24/2010   DM type 2 causing vascular disease (HCC) 06/27/2009   Gout 06/27/2009   Tobacco abuse, in remission 06/27/2009   COLONIC POLYPS 10/31/2008   GERD (gastroesophageal reflux disease) 10/31/2008    PCP: Shona Norleen PEDLAR, MD  REFERRING PROVIDER: Harden Jerona GAILS, MD  REFERRING DIAG: [phone] (ICD-10-CM) - Hx of AKA (above knee amputation), left (HCC)   THERAPY DIAG:  Other abnormalities of gait and mobility  Muscle weakness (generalized)  Stiffness of left  hip, not elsewhere classified  Abnormal posture  Unsteadiness on feet  Rationale for Evaluation and Treatment: Rehabilitation  ONSET DATE: Prosthesis delivery 02/20/2024  SUBJECTIVE:   SUBJECTIVE STATEMENT:   Patient endorsing continued Rt LE pain as well as soreness in residual limb on Lt LE.   Patient's wife not feeling well and requiring medical attention: BP: 87/52  HR: 64bpm  blood glucose: 180    PERTINENT HISTORY: DM2, GERD, hypertension, dyslipidemia, CAD, L BKA (06/17/23), neuropathy  Eval: Patient is a 63 year old male who presents to physical therapy with a left AKA (08/19/2023) with a new prosthesis delivery on 02/20/2024. Patient had previous home health PT prior to Lt AKA. He is able to mobilize with modified independence w/ walker. Wears prosthesis about ten hours a day and by the end of the night he takes it off due to fatigue and pain.   PAIN:  Are you having pain? Yes: NPRS scale: 3-4/10 Pain location: Femur in prosthesis and pain in the Rt leg as well Pain description: feels like its been hammered Aggravating factors: keeping prosthesis on for long periods of times, sitting Relieving factors: taking off prosthesis and medications  PRECAUTIONS: Fall  RED FLAGS: None   WEIGHT BEARING RESTRICTIONS: No  FALLS:  Has patient fallen in last 6 months? Yes. Number of falls 2, forgetting he doesn't have a leg and put weight onto the left side. He did not hit the floor, only fell into another person. Occurred when transferring from sitting to standing  LIVING ENVIRONMENT: Lives with: lives with their spouse Lives in: Other Trailer on grass Stairs: Yes: External: 4 steps; can reach both Has following equipment at home: Single point cane, Environmental consultant - 2 wheeled, Wheelchair (manual), and shower chair  OCCUPATION: retired   PLOF: Independent with household mobility with device  PATIENT GOALS: ambulating and ADLs with cane   NEXT MD VISIT: Office MD visit:  03/21/2024   OBJECTIVE:  Patient-Specific Activity Scoring Scheme  0 represents "unable to perform." 10 represents "able to perform at prior level. 0 1 2 3 4 5 6 7 8 9  10 (Date and Score)   Activity Eval     1. Walking w/ cane  2    2. Standing for  ADLs  5    3. Stairs 5   4.    5.    Score 4    Total score = sum of the activity scores/number of activities Minimum detectable change (90%CI) for average score = 2 points Minimum detectable change (90%CI) for single activity score = 3 points  DIAGNOSTIC FINDINGS: none post-amputation  COGNITION: Overall cognitive status: Within functional limits for tasks assessed   SENSATION: WFL  MUSCLE LENGTH:   POSTURE: rounded shoulders, forward head, flexed trunk , and weight shift right  LOWER EXTREMITY ROM:  ROM P:passive  A:active Left eval  Hip flexion   Hip extension Thomas position: P: - 40*  Hip abduction   Hip adduction   Hip internal rotation   Hip external rotation   Knee flexion   Knee extension   Ankle dorsiflexion   Ankle plantarflexion   Ankle inversion   Ankle eversion    (Blank rows = not tested)  LOWER EXTREMITY MMT:  MMT Right eval Left eval  Hip flexion 3/5 3/5  Hip extension 3/5 3-/5  Hip abduction  3-/5  Hip adduction    Hip internal rotation    Hip external rotation    Knee flexion    Knee extension 3/5   Ankle dorsiflexion    Ankle plantarflexion    Ankle inversion    Ankle eversion    At Evaluation all strength testing is grossly seated and functionally standing / gait. (Blank rows = not tested)  BED MOBILITY:  Sit to supine Complete Independence Supine to sit Complete Independence Rolling to Right Complete Independence  TRANSFERS: 04/10/2024: Pt able stand to/from sit into chair without armrest using BUEs on seat and RW for stabilization with supervision/verbal cues.  Eval: Sit to stand: supervision requires a stable object to arise, stabilization w/ walker using UE  support, with delayed weight shift onto the prosthesis for stabilization, and uses UE to bring Lt prosthesis back below his body  Stand to sit: supervision requires a stable object to arise, stabilization w/ walker using UE support, with delayed weight shift onto the prosthesis for stabilization  FUNCTIONAL TESTs:  04/10/2024: Patient able to reach 10 forward and look over his shoulders with single upper extremity support on RW.   Eval:  BERG score 11/56  GAIT: 04/10/2024: Patient ambulated 125' with RW and supervision.   Patient negotiated 12*ramp and 6.5 curb with RW and supervision  Eval: Gait pattern: step to pattern, decreased step length- Right, decreased stance time- Left, decreased hip/knee flexion- Left, circumduction- Left, Left hip hike, antalgic, trendelenburg, lateral lean- Right, trunk flexed, abducted- Left, and poor foot clearance- Left Distance walked: 62 ft Assistive device utilized: Walker - 2 wheeled and AKA prosthesis Level of assistance: SBA / steady with RW support but needs cues  Gait velocity: 0.72 ft/sec Comments: excessive weightbearing on BUEs using RW  RAMP : not tested  CURB: not tested  CURRENT PROSTHETIC WEAR ASSESSMENT:   Patient is dependent with: skin check, residual limb care, care of non-amputated limb, prosthetic cleaning, ply sock cleaning, correct ply sock adjustment, proper wear schedule/adjustment, and proper weight-bearing schedule/adjustment Donning prosthesis: SBA / verbal cues Doffing prosthesis: Complete Independence Prosthetic wear tolerance: 6-8 hours, 1x/day, 7 days/week Prosthetic weight bearing tolerance: tolerated standing for 10 minutes with partial weight on prosthesis during BERG balance test with no increased pain. Although he reports standing pain with activities at home. Edema: minimal Residual limb condition: 3x69mm superficial wound distally located, scar with mild adherence, no adductor  roll, normal skin color, decreased  hair growth present Prosthetic description: silicone liner with suction ring suspension, ischial containment socket with flexible inner socket, rotator unit, SAFETY (single axis friction engaging with an extension assist), flexible keel foot  TODAY'S TREATMENT:                                                                                                                    DATE: 05/24/2024 Prosthetic Training with Transfemoral Prosthesis PT discussed pain in Rt LE, potential causes, and then performed a skin check. PT noting several scabs throughout LE, that has also been noted and cleared by primary care MD, dry skin, and is highly painful to palpation and ankle ROM (DF and PF). PT discussed importance of following up with MD in order to improve pain levels as it is currently impacting PT, ambulation, and personal care. Patient also notes that the pain is similar to pain in Lt LE prior to amputation and PT endorsed seeing MD. Patient and spouse acknowledged and agreed.  Patient was scheduled for same day appointment with Maurilio Collet, PA-C in Dr. Crist office in order to clear patient.  PT discussed importance of posture then had patient perform seated rows with red TB. Patient requiring tactile and verbal cues for appropriate form with minimal carryover. Activity was discontinued secondary to above noted sam-day appointment.  TODAY'S TREATMENT:                                                                                                                    DATE: 05/22/2024 Prosthetic Training with Transfemoral Prosthesis PT discussed prosthesis wear time with patient as patient endorses intermittent difficulty secondary to sensation of tightness and feeling like it is going to explode. Patient notes ability to wear for 6-7 hours 2x/day on some days, though on difficult days he can only don for 2-3 hours at a time. Patient endorses sensation of ice cold and pale skin on residual limb when doffing  prosthesis and liner that can last up to 40-45 minutes and will improve when covering it up with a blanket to rewarm.  Patient ambulated 1x40' with RW and SBA-SUP from PT for reassessment following 3-week break from PT. Patient performed with increased Lt abduction, intermittent Lt toe drag during swing phase, and Lt femoral ER throughout.  Patient then performed 1x40' with SPC with quad tip and CGA from PT. Patient did not have any moments of instability with forward ambulation, though continued to have deviations that were noted with RW.  PT discussed ladder safety and appropriate form when ascending and descending ladder with patient and spouse. Patient and spouse acknowledged and agreed.  PT discussed follow up with MD in order to address high levels of pain in intact limb as patient endorses sensations similar to Lt LE when it had the infection that began the amputation process. Patient and spouse acknowledged and agreed.   Neuro Re-Ed:  PT discussed balance changes that occur with a prosthesis and the decrease of proprioceptive feedback from LE musculature. PT educated on how patient will continue to receive feedback from residual limb, however, it takes time for body to learn from residual limb and it is a different sort of input compared to typical. Patient acknowledged. Patient performed standing punch with blue TB to challenge standing balance with equal weightbearing with CGA from PT throughout. Patient endorsed ease of performance with blue TB which then PT discontinued. Patient then performed reactive balance while walking fwd/backward with bilat handles on cable machine each at 10# each and CGA-max A throughout. Max A required one time with Rt LOB during backward ambulation toward machine. PT discontinued following third attempt.     TODAY'S TREATMENT:                                                                                                                    DATE:  04/26/2024 Prosthetic Training with Transfemoral Prosthesis  Patient performed several sit<>stands throughout session with SUP>SBA using both RW and SPC with quad tip. Patient able to verbalize appropriate placement of SPC with quad tip during sit<>stands. Patient ambulating throughout session with SPC with quad tip and CGA for 51'9, 20', and 66'. Patient performing with improved forward gaze this date but continues to have forward lean and thoracic kyphosis. PT discussed and educated patient on increased energy expenditure post-amputation as well as ambulating with SPC vs RW.   Neuro Re-Ed:  Patient performed standing taps to light up pods on window to challenge static balance, reaching outside of BOS, and reaction time. Patient performing 30s of each activity with CGA and no required increased physical assist or overt LOB.  Normal stance, narrow stance, semi-tandem stance with Rt LE back, semi-tandem with Lt LE back, semi-tandem with Lt LE back using only Rt hand, and semi-tandem Lt LE back using only Lt hand  Patient performing tossing and catching for 4x30s in parallel bars with CGA throughout while standing in typical stance, narrow stance, and 2x in semi-tandem with Lt LE back. Intermittent posterolateral LOB with need for increase in physical assist to at least mod A, though patient able to use bilat UE on parallel bars.    TODAY'S TREATMENT:  DATE: 04/23/2024 Prosthetic Training with Transfemoral Prosthesis  4 Square stepping pattern activity with cane and minA/CGA.  Patient had difficulty stepping in all directions due to various things such as long step length, instability, and putting weight on his prosthesis before locking out the knee. PT demo & verbal cues on safe environment to challenge balance with 4-square stepping but able to catch himself with intermittent touch on chair  backs or RW enabling him to perform without PT assist.  PT verbally educated and demoed how to negotiate 4 inch curb with cane.  Patient verbalized and demoed understanding.  Patient negotiated 4 inch curb with HHA and cane 3 reps.  Verbal cues needed for cane gait pattern .  Progressed to Patient negotiated 4 inch curb with cane and minA / CGA with close position for HHA stabilization if needed (but able to perform without touching hand).  Verbal cues needed for cane gait pattern reminders Patient ambulated  76' with open water  bottle, cane, and CGA.  Before initiating walk patient had to reach for water  bottle on desk which caused him to lose balance and required maxA to recover. After ambulation, patient reached to put water  bottle down with no spills.  Pt reported that he found this more challenging than expected.  Patient negotiated stairs with step to pattern with single rail support demo'ing what he is currently doing at home.  Patient negotiated stairs with step to pattern with single rail and cane support. Patient felt more stable this way and appeared safer.   TODAY'S TREATMENT:                                                                                                                    DATE: 04/16/2024 Prosthetic Training with Transfemoral Prosthesis  PT verbally educated patient about using ply socks appropriately including donning. Trialed different ply sock sizes and ambulated to feel appropriate fit.  Patient verbalized and demoed understanding Patient ambulated 27' with cane and CGA and performed a stand to sit into chair with no armrests.  Patient fatigued after exercise Patient ambulated 25' with cane and CGA.  Patient had a loss of balance which required maxA x2 to recover from to prevent a fall.  Patient ambulated 15' with cane and CGA.  Minimal loss of balance when transferring from cane to RW  Neuro Re-Ed Following exercises were done with blue Thera-Band to challenge standing  balance Single arm row with each arm independently and bilateral rows, 5x Single arm forward punch with each arm independently and bilateral punches, 5x Single bicep curl plus shoulder flexion with each arm independently and bilateral bicep curl plus shoulder flexion, 5x   TREATMENT:  DATE: 04/12/2024 Prosthetic Training with Transfemoral Prosthesis  PT discussing wear time with transfemoral prosthesis. Patient noting increasing time to 2x6 hours with 2 hour break as of yesterday, but described day as more of 8 hours on, 30 minutes off, and 8 hours on before removing prosthesis for the night. Patient does endorse removing prosthesis for about 2 hours occasionally, however, does not like to sit still for long periods of time and does not want to perform mobility with RW without prosthesis. PT reminding patient to give himself enough breaks built in for sweat checks, drying residual limb and prosthesis, and for no overuse. Patient endorsing completing dry time breaks within his day for about 5-10 minutes a day.  PT discussed ply socks with patient and their importance especially over the next few months before he receives a new socket as his residual limb is going to change shape and the ply socks will assist with appropriate fit. Patient endorses returning to clinic after session to make next appointment. PT reminded patient to bring ply socks to next session if he were to receive them while visiting the clinic to make his next appointment with the prosthetist. Reassessed height of RW as patient ambulated with increased trunk flexion and thoracic kyphosis at beginning of session. PT increased height of RW to appropriate height by two settings in order to decrease postural compensations; patient performing with improved posture after adjustments. Patient performing several sit<>stands  from standard arm chair with close SBA with intermittent difficulty bending prosthesis when sitting. No required increased physical assist from PT or uncontrolled movements. Patient performing household distance ambulation, ~2x30', with RW and close SBA with noted very large Lt step lengths. Patient will benefit from gait training to equalize step lengths and stance time.  Neuro Re-Ed:  Patient performing multiple balance activities this date with CGA-minA while standing in a corner and having a chair in front of him for safety and confidence.  2x30s with narrow BOS; increased sway noted but no need for increased physical assistance from PT  2x30s with semi tandem stance; increased sway noted with minimal instances of posterior LOB; patient able to catch himself on wall and self-correct back into starting stance  2x30s with narrow BOS and horizontal head turns; patient requiring increased assist to minA for lateral and posterior LOB that was worse with Lt head turn  2x30s with narrow BOS and vertical head turns; patient experiencing increased sway and posterior LOB with patient able to catch on wall and self-correct back into starting stance  1x30s with narrow BOS and eyes closed; patient experiencing fear and few instances of LOB (posterior, anterior, and lateral) with only one instance of requiring increased physical assist from PT to return to starting stance PT discussed with patient about not beginning these exercises at home yet due to busy home life and plenty of exercises already on HEP, but to expect that they will eventually be added.   PATIENT EDUCATION: PATIENT EDUCATED ON FOLLOWING PROSTHETIC CARE: Education details: Sitting positioning to limit skin traction from weight of prosthesis, how hip flexion contracture effects residual limb pain & posture,  Prosthetic cleaning and Propper donning  Person educated: Patient, Spouse, and close family friend Education method: Explanation,  Demonstration, Tactile cues, and Verbal cues Education comprehension: verbalized understanding and needs further education  HOME EXERCISE PROGRAM: Access Code: [email or web address] URL: [email or web address] Date: 03/20/2024 Prepared by: Grayce Spatz  Exercises - Modified Thomas Stretch  - 2 x daily - 7 x weekly - 1 sets -  2-3 reps - 30 seconds hold - Hip Flexor Stretch at Edge of Bed  - 2 x daily - 7 x weekly - 1 sets - 2-3 reps - 30 seconds hold - Standing posture with back to counter  - 2 x daily - 7 x weekly - 1 sets - 1 reps - 5-10 minutes hold - Upright Stance at Door Frame Single Arm  - 3-6 x daily - 7 x weekly - 1 sets - 2 reps - 2 deep breathes hold - Upright Stance at Door Frame with Both Arms  - 3-6 x daily - 7 x weekly - 1 sets - 2 reps - 2 deep breathes hold  Do each exercise 1-2  times per day Do each exercise 5-10 repetitions Hold each exercise for 2 seconds to feel your location  AT SINK FIND YOUR MIDLINE POSITION AND PLACE FEET EQUAL DISTANCE FROM THE MIDLINE.  Try to find this position when standing still for activities.   USE TAPE ON FLOOR TO MARK THE MIDLINE POSITION which is even with middle of sink.  You also should try to feel with your limb pressure in socket.  You are trying to feel with limb what you used to feel with the bottom of your foot.  Side to Side Shift: Moving your hips only (not shoulders): move weight onto your left leg, HOLD/FEEL pressure in socket.  Move back to equal weight on each leg, HOLD/FEEL pressure in socket. Move weight onto your right leg, HOLD/FEEL pressure in socket. Move back to equal weight on each leg, HOLD/FEEL pressure in socket. Repeat.  Start with both hands on sink, progress to hand on prosthetic side only, then no hands.  Front to Back Shift: Moving your hips only (not shoulders): move your weight forward onto your toes, HOLD/FEEL pressure in socket. Move your weight back to equal Flat Foot on both legs, HOLD/FEEL  pressure in  socket. Move your weight back onto your heels, HOLD/FEEL  pressure in socket. Move your weight back to equal on both legs, HOLD/FEEL  pressure in socket. Repeat.  Start with both hands on sink, progress to hand on prosthetic side only, then no hands.  Moving Cones / Cups: With equal weight on each leg: Hold on with one hand the first time, then progress to no hand supports. Move cups from one side of sink to the other. Place cups ~2" out of your reach, progress to 10" beyond reach.  Place one hand in middle of sink and reach with other hand. Do both arms.  Then hover one hand and move cups with other hand.  Overhead/Upward Reaching: alternated reaching up to top cabinets or ceiling if no cabinets present. Keep equal weight on each leg. Start with one hand support on counter while other hand reaches and progress to no hand support with reaching.  ace one hand in middle of sink and reach with other hand. Do both arms.  Then hover one hand and move cups with other hand.  5.   Looking Over Shoulders: With equal weight on each leg: alternate turning to look over your shoulders with one hand support on counter as needed.  Start with head motions only to look in front of shoulder, then even with shoulder and progress to looking behind you. To look to side, move head /eyes, then shoulder on side looking pulls back, shift more weight to side looking and pull hip back. Place one hand in middle of sink and let go with other  hand so your shoulder can pull back. Switch hands to look other way.   Then hover one hand and look over shoulder. If looking right, use left hand at sink. If looking left, use right hand at sink. 6.  Stepping with leg that is not amputated:  Move items under cabinet out of your way. Shift your hips/pelvis so weight on prosthesis. Tighten muscles in hip on prosthetic side.  SLOWLY step other leg so front of foot is in cabinet. Then step back to floor.    ASSESSMENT:  CLINICAL IMPRESSION:     Patient arrived to session noting same amount of pain in Rt LE that has started to impact ambulation. PT performed skin check, educated patient on importance of intact limb care, and endorsed importance of following up with MD. Patient was scheduled for same day appointment at 1515. Patient will continue to benefit from skilled PT.  OBJECTIVE IMPAIRMENTS: Abnormal gait, decreased activity tolerance, decreased balance, decreased endurance, decreased knowledge of condition, decreased knowledge of use of DME, decreased mobility, difficulty walking, decreased ROM, decreased strength, impaired flexibility, postural dysfunction, prosthetic dependency , and pain.   ACTIVITY LIMITATIONS: carrying, lifting, bending, sitting, standing, squatting, stairs, transfers, bathing, toileting, dressing, reach over head, and locomotion level  PARTICIPATION LIMITATIONS: meal prep, cleaning, laundry, driving, shopping, community activity, and yard work  PERSONAL FACTORS: Age, Fitness, Past/current experiences, Time since onset of injury/illness/exacerbation, and 3+ comorbidities: see PMH are also affecting patient's functional outcome.   REHAB POTENTIAL: Good  CLINICAL DECISION MAKING: Evolving/moderate complexity  EVALUATION COMPLEXITY: Moderate   GOALS: Goals reviewed with patient? Yes  SHORT TERM GOALS: Target date: 04/05/2024  Patient donnes prosthesis modified independent & verbalizes proper cleaning. Baseline: SEE OBJECTIVE DATA Goal status: MET, 04/10/2024  2.  Patient tolerates prosthesis >12 hrs total /day without skin issues or limb pain >7/10 after standing. Baseline: patient reports wearing 4-5 hr/day Goal status: NOT MET, 04/10/2024  3.  Patient able to reach 7 and look over both shoulders with RW support with Modified independent. Baseline: SEE OBJECTIVE DATA Goal status: MET, 04/10/2024  4. Patient ambulates 125' with RW & prosthesis with supervision. Baseline: SEE OBJECTIVE DATA Goal  status: MET, 04/10/2024  5. Patient negotiates ramps & curbs with RW & prosthesis with supervision. Baseline: SEE OBJECTIVE DATA Goal status: MET, 04/10/2024  LONG TERM GOALS: Target date: 05/31/2024  Patient demonstrates & verbalized understanding of prosthetic care to enable safe utilization of prosthesis. Baseline: SEE OBJECTIVE DATA Goal status: Ongoing   03/20/2024  Patient tolerates prosthesis wear >90% of awake hours without skin issues or limb pain >4/10. Baseline: SEE OBJECTIVE DATA Goal status: Ongoing   03/20/2024  BERG >/= 25/56 to indicate lower fall risk Baseline: SEE OBJECTIVE DATA Goal status: Ongoing   03/20/2024  Patient ambulates >300' with prosthesis and LRAD independently Baseline: SEE OBJECTIVE DATA Goal status: Ongoing   03/20/2024  Patient negotiates ramps, curbs & stairs with single rail with prosthesis and LRAD independently. Baseline: SEE OBJECTIVE DATA Goal status: Ongoing   03/20/2024  6.  Patient reports Patient-Specific Activity Score improved the average by 3 to indicate improvement in functional activities.   Baseline: SEE OBJECTIVE DATA Goal status: Ongoing   03/20/2024  PLAN:  PT FREQUENCY: 2x/week  PT DURATION: 12 weeks  PLANNED INTERVENTIONS: 97164- PT Re-evaluation, 97750- Physical Performance Testing, 97110-Therapeutic exercises, 97530- Therapeutic activity, [id number]- Neuromuscular re-education, 97535- Self Care, [id number]- Manual therapy, [id number]- Gait training, [phone]- Prosthetic Initial , [phone]- Orthotic/Prosthetic subsequent, Patient/Family education, Balance training, Stair training,  Joint mobilization, and DME instructions  PLAN FOR NEXT SESSION:  getting up off the floor after a fall,  objective measure of 4 square step test and gait velocity, challenge step length consistency, ambulation with cane including curb, reassess Rt LE and Lt residual limb soreness/pain    Susannah Daring, PT, DPT 05/24/24 3:35 PM     Date of referral:  02/13/2024 Referring provider: Harden Jerona GAILS, MD Referring diagnosis? S10.387 (ICD-10-CM) - Hx of AKA (above knee amputation), left (HCC)  Treatment diagnosis? (if different than referring diagnosis) R26.89, M62.81, M79.605, M25.652, R29.3, R26.81, L98.498  What was this (referring dx) caused by? Other: Lt AKA  Nature of Condition: Initial Onset (within last 3 months)   Laterality: Lt  Current Functional Measure Score: Patient Specific Functional Scale Score:4  Objective measurements identify impairments when they are compared to normal values, the uninvolved extremity, and prior level of function.  [x]  Yes  []  No  Objective assessment of functional ability: Moderate functional limitations   Briefly describe symptoms: Patient is dependent in prosthetic care and use with high risk of falls. Gait deviation, muscle weakness, pain in Lt residual limb, hip flexion contracture.  How did symptoms start: amputation due to vascular issues  Average pain intensity:  Last 24 hours: 8-9/10  Past week: 8-9/10  How often does the pt experience symptoms? Constantly  How much have the symptoms interfered with usual daily activities? Quite a bit  How has condition changed since care began at this facility? NA - initial visit  In general, how is the patients overall health? Good   BACK PAIN (STarT Back Screening Tool) No

## 2024-05-24 ENCOUNTER — Ambulatory Visit: Admitting: Physician Assistant

## 2024-05-24 ENCOUNTER — Other Ambulatory Visit: Payer: Self-pay

## 2024-05-24 ENCOUNTER — Ambulatory Visit

## 2024-05-24 DIAGNOSIS — M79604 Pain in right leg: Secondary | ICD-10-CM

## 2024-05-24 DIAGNOSIS — S8011XA Contusion of right lower leg, initial encounter: Secondary | ICD-10-CM | POA: Diagnosis not present

## 2024-05-24 DIAGNOSIS — M6281 Muscle weakness (generalized): Secondary | ICD-10-CM | POA: Diagnosis not present

## 2024-05-24 DIAGNOSIS — R2689 Other abnormalities of gait and mobility: Secondary | ICD-10-CM | POA: Diagnosis not present

## 2024-05-24 DIAGNOSIS — R293 Abnormal posture: Secondary | ICD-10-CM

## 2024-05-24 DIAGNOSIS — M25652 Stiffness of left hip, not elsewhere classified: Secondary | ICD-10-CM

## 2024-05-24 DIAGNOSIS — R2681 Unsteadiness on feet: Secondary | ICD-10-CM

## 2024-05-24 NOTE — Progress Notes (Unsigned)
 u  Office Visit Note   Patient: Mike Floyd           Date of Birth: Aug 28, 1960           MRN: [id number] Visit Date: 05/24/2024              Requested by: Hurst Norleen PEDLAR, MD [address] [place],  [place] [address] PCP: Hurst Norleen PEDLAR, MD  Chief Complaint  Patient presents with   Right Leg - Pain      HPI: Patient is a 63 year old gentleman is 6 months status post left above-knee amputation. He was in PT for gait traning and has noted new right anterior tibial pain.  He denies injury to the right LE.  No redness or bruising just tender to touch.  He denies fever and chills.  He states the right leg feels like the left one did before he had the amputation so he wanted us  to exam him.    Assessment & Plan: Visit Diagnoses:  1. Pain in right leg     Plan: He states the leg feels better with weight on it.  I wrapped his right lower leg with ace wrap for compression.  He states it felt better.  Elevation when at rest.    Follow-Up Instructions: No follow-ups on file.   Ortho Exam  Patient is alert, oriented, no adenopathy, well-dressed, normal affect, normal respiratory effort. Tenderness to slight touch over the mid anterior tenderness.  No ecchymosis or edema.  Comparments are soft, no pitting edema.  He has good doppler signals in DP/PT/Peroneal arteries without ischemic skin changes.   Foot and ankle ROM intact.  Active ankle dorsiflexion does not increase or cause pain.      Imaging: There are no deformities to the tibia.  No Bone destruction (erosions or lucencies).  proximal fibula has bone remodeling/deformity.  No Periosteal reaction.  Labs: Lab Results  Component Value Date   HGBA1C 7.7 (H) 06/17/2023   HGBA1C 9.7 (H) 05/13/2022   HGBA1C 7.0 (H) 06/15/2017   ESRSEDRATE 2 03/14/2023   CRP <1 03/14/2023   CRP 0.6 05/16/2022   CRP 2.2 06/27/2019   LABURIC 5.5 03/07/2012   REPTSTATUS 05/18/2022 FINAL 05/13/2022   CULT  05/13/2022    NO GROWTH 5  DAYS Performed at [facility], [address] [place], [place] [address]      Lab Results  Component Value Date   ALBUMIN  4.0 06/17/2023   ALBUMIN  4.4 05/04/2023   ALBUMIN  4.1 02/26/2023   PREALBUMIN 23 06/17/2023    Lab Results  Component Value Date   MG 1.8 02/26/2023   MG 1.7 05/20/2022   MG 2.0 05/19/2022   Lab Results  Component Value Date   VD25OH 18 (L) 06/15/2017    Lab Results  Component Value Date   PREALBUMIN 23 06/17/2023      Latest Ref Rng & Units 02/06/2024   11:59 PM 11/02/2023   10:58 PM 08/19/2023    8:08 AM  CBC EXTENDED  WBC 4.0 - 10.5 K/uL 5.2  4.5  6.4   RBC 4.22 - 5.81 MIL/uL 4.91  4.75  5.37   Hemoglobin 13.0 - 17.0 g/dL 85.6  87.0  84.4   HCT 39.0 - 52.0 % 40.8  37.9  45.3   Platelets 150 - 400 K/uL 134  181  138      There is no height or weight on file to calculate BMI.  Orders:  Orders Placed This  Encounter  Procedures   XR Tibia/Fibula Right   No orders of the defined types were placed in this encounter.    Procedures: No procedures performed  Clinical Data: No additional findings.  ROS:  All other systems negative, except as noted in the HPI. Review of Systems  Objective: Vital Signs: There were no vitals taken for this visit.  Specialty Comments:  No specialty comments available.  PMFS History: Patient Active Problem List   Diagnosis Date Noted   Dyslipidemia 11/03/2023   Essential hypertension 11/03/2023   Type 2 diabetes mellitus with peripheral neuropathy (HCC) 11/03/2023   BPH (benign prostatic hyperplasia) 11/03/2023   GERD without esophagitis 11/03/2023   S/P AKA (above knee amputation) unilateral, left (HCC) 08/19/2023   Dehiscence of amputation stump of left lower extremity (HCC) 07/22/2023   Ischemia of site of left below knee amputation (HCC) 07/22/2023   S/P BKA (below knee amputation) unilateral, left (HCC) 07/22/2023   Below-knee amputation of left lower extremity (HCC) 06/17/2023   Acute  osteomyelitis of metatarsal bone of left foot (HCC)    Subacute osteomyelitis, left ankle and foot (HCC) 05/13/2022   Non-pressure chronic ulcer of other part of right foot limited to breakdown of skin (HCC)    Contracture of right Achilles tendon    Mildly dilatd aortic root (HCC)    Contracture of left Achilles tendon    Metatarsal deformity, left    CAD (coronary artery disease)    Hallux hammertoe, left 12/11/2019   Diarrhea 06/27/2019   Diastasis recti 08/22/2018   Umbilical hernia without obstruction and without gangrene 08/22/2018   Vitamin D  deficiency 06/23/2017   Essential hypertension, benign 06/15/2017   Mixed hyperlipidemia 06/15/2017   Class 1 obesity due to excess calories with serious comorbidity and body mass index (BMI) of 31.0 to 31.9 in adult 06/15/2017   Malfunction of implantable cardioverter-defibrillator (ICD) electrode 10/07/2016   Chronic kidney disease (CKD), stage III (moderate) (HCC) 04/04/2015   Bladder neck obstruction 03/21/2015   Difficult or painful urination 03/21/2015   Flank pain 03/21/2015   Delayed onset of urination 03/21/2015   Calculus of kidney 03/21/2015   Erosive esophagitis 12/27/2013   Hypotension due to drugs 07/02/2013   Chronic systolic heart failure (HCC) 04/24/2013   CAP (community acquired pneumonia) 03/16/2013   HTN (hypertension) 03/15/2013   Hydrocele 03/15/2013   Spermatocele 03/15/2013   DOE (dyspnea on exertion) 03/15/2013   OSA (obstructive sleep apnea) 01/09/2013   Chronic kidney disease, stage 3 (HCC) 01/03/2013   Chest pain 12/26/2012   History of diagnostic tests 09/06/2012   TIA (transient ischemic attack)    Cardiomyopathy, nonischemic (HCC) 02/24/2010   AICD (automatic cardioverter/defibrillator) 02/24/2010   DM type 2 causing vascular disease (HCC) 06/27/2009   Gout 06/27/2009   Tobacco abuse, in remission 06/27/2009   COLONIC POLYPS 10/31/2008   GERD (gastroesophageal reflux disease) 10/31/2008   Past  Medical History:  Diagnosis Date   AICD (automatic cardioverter/defibrillator) present 11/2007   Removed in 2018, a. 11/2007 [facility] Current VR - single lead ICD  - Removed 2018 - it was burning me   Anxiety    CAD (coronary artery disease)    non-obstructive CAD by Cor CT in 2020 // Myoview  3/22: EF 57, small inf-sept defect c/w scar, no ischemia; low risk      Chest pain    a. 10/2007 Cath:  normal Cors.   CKD (chronic kidney disease), stage II    DDD (degenerative disc disease), lumbar  Diabetes mellitus DX: 2010   type 2   Erosive esophagitis    a. per EGD (08/2011), Dr. Golda - Erosive reflux esophagitis improved but not completely healed since previous EGD 3 years ago. Bx showing  ulcerated gatroesophageal junction mucosa. negative for H. pylori   GERD (gastroesophageal reflux disease)    Gout    Hearing deficit    a. wear bilateral hearing aides   History of hiatal hernia    History of kidney stones    passed stones, no surgery   Hypertension    Mildly dilatd aortic root (HCC)    CMR 4/22: EF 52, no LGE; d/w Dr. Ione root 38 mm (mildly dilated)   Myocardial infarction [facility]) 2011   Neuropathy    Feet and legs   Nonischemic dilated cardiomyopathy (HCC)    a. H/O EF as low as 35-40% by LV gram 10/2007;  b. Echo 02/2011 EF 50-55%, inf HK, Gr 1 DD // CMR 4/22: EF 52, no LGE; d/w Dr. Ione root 38 mm (mildly dilated)    Renal insufficiency    Sleep apnea    pt doesnt use, statesI cant afford one. PCP aware   Stroke (HCC)    mini-stroke in 2014   TIA (transient ischemic attack)    July, 2013   Tobacco abuse, in remission 06/27/2009   Discontinued in 2009     Wears dentures    top plate   WPW (Wolff-Parkinson-White syndrome)    a. s/p RFCA @ [facility] - 1999    Family History  Problem Relation Age of Onset   Heart attack Mother 47   Hypertension Mother    Diabetes Mother    Kidney disease Mother    Breast cancer Mother    Heart attack Father 44   Hypertension  Father    Diabetes Father    Stroke Brother    Heart attack Brother 91   Stroke Maternal Grandmother 28   Diabetes Brother    Hypertension Brother     Past Surgical History:  Procedure Laterality Date   AMPUTATION Left 05/19/2022   Procedure: TRANSMETATARSAL AMPUTATION LEFT FOOT;  Surgeon: Harden Jerona GAILS, MD;  Location: [facility] OR;  Service: Orthopedics;  Laterality: Left;   AMPUTATION Left 06/17/2023   Procedure: LEFT BELOW KNEE AMPUTATION;  Surgeon: Harden Jerona GAILS, MD;  Location: [facility] OR;  Service: Orthopedics;  Laterality: Left;   AMPUTATION Left 08/19/2023   Procedure: LEFT ABOVE KNEE AMPUTATION;  Surgeon: Harden Jerona GAILS, MD;  Location: [facility] OR;  Service: Orthopedics;  Laterality: Left;   APPENDECTOMY     BACK SURGERY     1995   CARDIAC CATHETERIZATION     2009   CARDIAC DEFIBRILLATOR PLACEMENT     ICD was removed in 2018   CHOLECYSTECTOMY     CHOLECYSTECTOMY, LAPAROSCOPIC     11/2007   COLONOSCOPY W/ POLYPECTOMY  2009   ELBOW SURGERY Left 06/2010   ESOPHAGEAL DILATION N/A 11/08/2014   Procedure: ESOPHAGEAL DILATION;  Surgeon: Claudis RAYMOND Golda, MD;  Location: [facility] ORS;  Service: Endoscopy;  Laterality: N/A;  #56,    ESOPHAGOGASTRODUODENOSCOPY  03/31/2012   also 08/2011; [name]   ESOPHAGOGASTRODUODENOSCOPY (EGD) WITH PROPOFOL  N/A 11/08/2014   Procedure: ESOPHAGOGASTRODUODENOSCOPY (EGD) WITH PROPOFOL ;  Surgeon: Claudis RAYMOND Golda, MD;  Location: [facility];  Service: Endoscopy;  Laterality: N/A;  [place] is 51 , [place] is 37   FOOT ARTHRODESIS Left 10/24/2020   Procedure: LEFT GASTROCNEMIUS RECESSION, DORSIFLEXION OSTEOTOMY 1ST MT;  Surgeon: Harden,  Jerona GAILS, MD;  Location: [facility] OR;  Service: Orthopedics;  Laterality: Left;   FOOT ARTHRODESIS Right 01/09/2021   Procedure: CLOSING WEDGE OSTEOTOMY RIGHT 1ST METATARSAL;  Surgeon: Harden Jerona GAILS, MD;  Location: [facility] OR;  Service: Orthopedics;  Laterality: Right;   GASTROCNEMIUS RECESSION Right 01/09/2021   Procedure: RIGHT GASTROCNEMIUS RECESSION;   Surgeon: Harden Jerona GAILS, MD;  [facility];  Service: Orthopedics;  Laterality: Right;   ICD LEAD REMOVAL N/A 10/07/2016   Procedure: ICD LEAD REMOVAL ;  Surgeon: Danelle LELON Birmingham, MD;  Location: [facility] OR;  Service: Cardiovascular;  Laterality: N/A;   MULTIPLE TOOTH EXTRACTIONS     RADIOFREQUENCY ABLATION  1999   WPW; performed at [facility]   STUMP REVISION Left 07/22/2023   Procedure: REVISION LEFT BELOW KNEE AMPUTATION;  Surgeon: Harden Jerona GAILS, MD;  Location: [facility] OR;  Service: Orthopedics;  Laterality: Left;   TEE WITHOUT CARDIOVERSION N/A 10/07/2016   Procedure: TRANSESOPHAGEAL ECHOCARDIOGRAM (TEE);  Surgeon: Danelle LELON Birmingham, MD;  Location: [facility] OR;  Service: Cardiovascular;  Laterality: N/A;   Social History   Occupational History   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    Comment: Laid off in 11/11  Tobacco Use   Smoking status: Former    Current packs/day: 0.00    Average packs/day: 1.5 packs/day for 39.1 years (58.6 ttl pk-yrs)    Types: Cigarettes    Start date: 06/30/1971    Quit date: 07/30/2010    Years since quitting: 13.8   Smokeless tobacco: Former    Types: Chew    Quit date: 03/26/1994  Vaping Use   Vaping status: Former   Start date: 07/30/2010   Quit date: 06/29/2016  Substance and Sexual Activity   Alcohol use: No    Alcohol/week: 1.0 standard drink of alcohol    Types: 1 Cans of beer per week    Comment: used years ago   Drug use: No   Sexual activity: Yes    Birth control/protection: None

## 2024-05-28 ENCOUNTER — Encounter: Payer: Self-pay | Admitting: Physician Assistant

## 2024-05-29 ENCOUNTER — Ambulatory Visit: Admitting: Physical Therapy

## 2024-05-29 ENCOUNTER — Encounter: Payer: Self-pay | Admitting: Physical Therapy

## 2024-05-29 DIAGNOSIS — R2689 Other abnormalities of gait and mobility: Secondary | ICD-10-CM

## 2024-05-29 DIAGNOSIS — R293 Abnormal posture: Secondary | ICD-10-CM

## 2024-05-29 DIAGNOSIS — L98498 Non-pressure chronic ulcer of skin of other sites with other specified severity: Secondary | ICD-10-CM

## 2024-05-29 DIAGNOSIS — M6281 Muscle weakness (generalized): Secondary | ICD-10-CM

## 2024-05-29 DIAGNOSIS — R2681 Unsteadiness on feet: Secondary | ICD-10-CM

## 2024-05-29 DIAGNOSIS — M79605 Pain in left leg: Secondary | ICD-10-CM

## 2024-05-29 DIAGNOSIS — M25652 Stiffness of left hip, not elsewhere classified: Secondary | ICD-10-CM

## 2024-05-29 NOTE — Therapy (Signed)
 OUTPATIENT PHYSICAL THERAPY PROSTHETIC TREATMENT   Patient Name: Mike Floyd MRN: [id number] DOB:[birth year], 63 y.o., male Today's Date: 05/29/2024  END OF SESSION:  PT End of Session - 05/29/24 1431     Visit Number 15    Number of Visits 25    Date for Recertification  05/31/24    Authorization Type [employer] MEDICARE    Authorization Time Period $45 copay    Progress Note Due on Visit 20    PT Start Time 1430    Equipment Utilized During Treatment Gait belt    Activity Tolerance Patient tolerated treatment well;Patient limited by fatigue    Behavior During Therapy [facility] for tasks assessed/performed             Past Medical History:  Diagnosis Date   AICD (automatic cardioverter/defibrillator) present 11/2007   Removed in 2018, a. 11/2007 S[facility] Current VR - single lead ICD  - Removed 2018 - it was burning me   Anxiety    CAD (coronary artery disease)    non-obstructive CAD by Cor CT in 2020 // Myoview  3/22: EF 57, small inf-sept defect c/w scar, no ischemia; low risk      Chest pain    a. 10/2007 Cath:  normal Cors.   CKD (chronic kidney disease), stage II    DDD (degenerative disc disease), lumbar    Diabetes mellitus DX: 2010   type 2   Erosive esophagitis    a. per EGD (08/2011), Dr. Golda - Erosive reflux esophagitis improved but not completely healed since previous EGD 3 years ago. Bx showing  ulcerated gatroesophageal junction mucosa. negative for H. pylori   GERD (gastroesophageal reflux disease)    Gout    Hearing deficit    a. wear bilateral hearing aides   History of hiatal hernia    History of kidney stones    passed stones, no surgery   Hypertension    Mildly dilatd aortic root (HCC)    CMR 4/22: EF 52, no LGE; d/w Dr. Ione root 38 mm (mildly dilated)   Myocardial infarction [facility]) 2011   Neuropathy    Feet and legs   Nonischemic dilated cardiomyopathy (HCC)    a. H/O EF as low as 35-40% by LV gram 10/2007;  b. Echo 02/2011 EF 50-55%, inf HK, Gr 1  DD // CMR 4/22: EF 52, no LGE; d/w Dr. Ione root 38 mm (mildly dilated)    Renal insufficiency    Sleep apnea    pt doesnt use, statesI cant afford one. PCP aware   Stroke (HCC)    mini-stroke in 2014   TIA (transient ischemic attack)    July, 2013   Tobacco abuse, in remission 06/27/2009   Discontinued in 2009     Wears dentures    top plate   WPW (Wolff-Parkinson-White syndrome)    a. s/p RFCA @ [facility] - 1999   Past Surgical History:  Procedure Laterality Date   AMPUTATION Left 05/19/2022   Procedure: TRANSMETATARSAL AMPUTATION LEFT FOOT;  Surgeon: Harden Jerona GAILS, MD;  Location: [facility] OR;  Service: Orthopedics;  Laterality: Left;   AMPUTATION Left 06/17/2023   Procedure: LEFT BELOW KNEE AMPUTATION;  Surgeon: Harden Jerona GAILS, MD;  Location: [facility] OR;  Service: Orthopedics;  Laterality: Left;   AMPUTATION Left 08/19/2023   Procedure: LEFT ABOVE KNEE AMPUTATION;  Surgeon: Harden Jerona GAILS, MD;  Location: [facility] OR;  Service: Orthopedics;  Laterality: Left;   APPENDECTOMY     BACK SURGERY  1995   CARDIAC CATHETERIZATION     2009   CARDIAC DEFIBRILLATOR PLACEMENT     ICD was removed in 2018   CHOLECYSTECTOMY     CHOLECYSTECTOMY, LAPAROSCOPIC     11/2007   COLONOSCOPY W/ POLYPECTOMY  2009   ELBOW SURGERY Left 06/2010   ESOPHAGEAL DILATION N/A 11/08/2014   Procedure: ESOPHAGEAL DILATION;  Surgeon: Claudis RAYMOND Rivet, MD;  Location: [facility] ORS;  Service: Endoscopy;  Laterality: N/A;  #56,    ESOPHAGOGASTRODUODENOSCOPY  03/31/2012   also 08/2011; [name]   ESOPHAGOGASTRODUODENOSCOPY (EGD) WITH PROPOFOL  N/A 11/08/2014   Procedure: ESOPHAGOGASTRODUODENOSCOPY (EGD) WITH PROPOFOL ;  Surgeon: Claudis RAYMOND Rivet, MD;  Location: [facility] ORS;  Service: Endoscopy;  Laterality: N/A;  Hiatus is 1 , GE Junction is 37   FOOT ARTHRODESIS Left 10/24/2020   Procedure: LEFT GASTROCNEMIUS RECESSION, DORSIFLEXION OSTEOTOMY 1ST MT;  Surgeon: Harden Jerona GAILS, MD;  Location: [facility] OR;  Service: Orthopedics;  Laterality: Left;    FOOT ARTHRODESIS Right 01/09/2021   Procedure: CLOSING WEDGE OSTEOTOMY RIGHT 1ST METATARSAL;  Surgeon: Harden Jerona GAILS, MD;  Location: [facility] OR;  Service: Orthopedics;  Laterality: Right;   GASTROCNEMIUS RECESSION Right 01/09/2021   Procedure: RIGHT GASTROCNEMIUS RECESSION;  Surgeon: Harden Jerona GAILS, MD;  Location: [facility] OR;  Service: Orthopedics;  Laterality: Right;   ICD LEAD REMOVAL N/A 10/07/2016   Procedure: ICD LEAD REMOVAL ;  Surgeon: Danelle LELON Birmingham, MD;  Location: [facility] OR;  Service: Cardiovascular;  Laterality: N/A;   MULTIPLE TOOTH EXTRACTIONS     RADIOFREQUENCY ABLATION  1999   WPW; performed at [facility]   STUMP REVISION Left 07/22/2023   Procedure: REVISION LEFT BELOW KNEE AMPUTATION;  Surgeon: Harden Jerona GAILS, MD;  Location: [facility] OR;  Service: Orthopedics;  Laterality: Left;   TEE WITHOUT CARDIOVERSION N/A 10/07/2016   Procedure: TRANSESOPHAGEAL ECHOCARDIOGRAM (TEE);  Surgeon: Danelle LELON Birmingham, MD;  Location: [facility] OR;  Service: Cardiovascular;  Laterality: N/A;   Patient Active Problem List   Diagnosis Date Noted   Dyslipidemia 11/03/2023   Essential hypertension 11/03/2023   Type 2 diabetes mellitus with peripheral neuropathy (HCC) 11/03/2023   BPH (benign prostatic hyperplasia) 11/03/2023   GERD without esophagitis 11/03/2023   S/P AKA (above knee amputation) unilateral, left (HCC) 08/19/2023   Dehiscence of amputation stump of left lower extremity (HCC) 07/22/2023   Ischemia of site of left below knee amputation (HCC) 07/22/2023   S/P BKA (below knee amputation) unilateral, left (HCC) 07/22/2023   Below-knee amputation of left lower extremity (HCC) 06/17/2023   Acute osteomyelitis of metatarsal bone of left foot (HCC)    Subacute osteomyelitis, left ankle and foot (HCC) 05/13/2022   Non-pressure chronic ulcer of other part of right foot limited to breakdown of skin (HCC)    Contracture of right Achilles tendon    Mildly dilatd aortic root (HCC)    Contracture of left Achilles  tendon    Metatarsal deformity, left    CAD (coronary artery disease)    Hallux hammertoe, left 12/11/2019   Diarrhea 06/27/2019   Diastasis recti 08/22/2018   Umbilical hernia without obstruction and without gangrene 08/22/2018   Vitamin D  deficiency 06/23/2017   Essential hypertension, benign 06/15/2017   Mixed hyperlipidemia 06/15/2017   Class 1 obesity due to excess calories with serious comorbidity and body mass index (BMI) of 31.0 to 31.9 in adult 06/15/2017   Malfunction of implantable cardioverter-defibrillator (ICD) electrode 10/07/2016   Chronic kidney disease (CKD), stage III (moderate) (HCC) 04/04/2015   Bladder neck  obstruction 03/21/2015   Difficult or painful urination 03/21/2015   Flank pain 03/21/2015   Delayed onset of urination 03/21/2015   Calculus of kidney 03/21/2015   Erosive esophagitis 12/27/2013   Hypotension due to drugs 07/02/2013   Chronic systolic heart failure (HCC) 04/24/2013   CAP (community acquired pneumonia) 03/16/2013   HTN (hypertension) 03/15/2013   Hydrocele 03/15/2013   Spermatocele 03/15/2013   DOE (dyspnea on exertion) 03/15/2013   OSA (obstructive sleep apnea) 01/09/2013   Chronic kidney disease, stage 3 (HCC) 01/03/2013   Chest pain 12/26/2012   History of diagnostic tests 09/06/2012   TIA (transient ischemic attack)    Cardiomyopathy, nonischemic (HCC) 02/24/2010   AICD (automatic cardioverter/defibrillator) 02/24/2010   DM type 2 causing vascular disease (HCC) 06/27/2009   Gout 06/27/2009   Tobacco abuse, in remission 06/27/2009   COLONIC POLYPS 10/31/2008   GERD (gastroesophageal reflux disease) 10/31/2008    PCP: Shona Norleen PEDLAR, MD  REFERRING PROVIDER: Harden Jerona GAILS, MD  REFERRING DIAG: [id number] (ICD-10-CM) - Hx of AKA (above knee amputation), left (HCC)   THERAPY DIAG:  Other abnormalities of gait and mobility  Muscle weakness (generalized)  Stiffness of left hip, not elsewhere classified  Unsteadiness on  feet  Pain in left leg  Abnormal posture  Non-pressure chronic ulcer of skin of other sites with other specified severity [facility])  Rationale for Evaluation and Treatment: Rehabilitation  ONSET DATE: Prosthesis delivery 02/20/2024  SUBJECTIVE:   SUBJECTIVE STATEMENT:   His right leg is still painful. He reports fall 2 weeks ago landing on his buttocks.    PERTINENT HISTORY: DM2, GERD, hypertension, dyslipidemia, CAD, L BKA (06/17/23), neuropathy  Eval: Patient is a 64 year old male who presents to physical therapy with a left AKA (08/19/2023) with a new prosthesis delivery on 02/20/2024. Patient had previous home health PT prior to Lt AKA. He is able to mobilize with modified independence w/ walker. Wears prosthesis about ten hours a day and by the end of the night he takes it off due to fatigue and pain.   PAIN:  Are you having pain? Yes: NPRS scale: up to 3-4/10 Pain location: Femur in prosthesis and pain in the Rt leg as well Pain description: feels like its been hammered Aggravating factors: keeping prosthesis on for long periods of times, sitting Relieving factors: taking off prosthesis and medications  PRECAUTIONS: Fall  RED FLAGS: None   WEIGHT BEARING RESTRICTIONS: No  FALLS:  Has patient fallen in last 6 months? Yes. Number of falls 2, forgetting he doesn't have a leg and put weight onto the left side. He did not hit the floor, only fell into another person. Occurred when transferring from sitting to standing  LIVING ENVIRONMENT: Lives with: lives with their spouse Lives in: Other Trailer on grass Stairs: Yes: External: 4 steps; can reach both Has following equipment at home: Single point cane, Environmental consultant - 2 wheeled, Wheelchair (manual), and shower chair  OCCUPATION: retired   PLOF: Independent with household mobility with device  PATIENT GOALS: ambulating and ADLs with cane   NEXT MD VISIT: Office MD visit: 03/21/2024   OBJECTIVE:  Patient-Specific Activity  Scoring Scheme  0 represents "unable to perform." 10 represents "able to perform at prior level. 0 1 2 3 4 5 6 7 8 9  10 (Date and Score)   Activity Eval     1. Walking w/ cane  2    2. Standing for ADLs  5    3. Stairs 5   4.  5.    Score 4    Total score = sum of the activity scores/number of activities Minimum detectable change (90%CI) for average score = 2 points Minimum detectable change (90%CI) for single activity score = 3 points  DIAGNOSTIC FINDINGS: none post-amputation  COGNITION: Overall cognitive status: Within functional limits for tasks assessed   SENSATION: WFL  MUSCLE LENGTH:   POSTURE: rounded shoulders, forward head, flexed trunk , and weight shift right  LOWER EXTREMITY ROM:  ROM P:passive  A:active Left eval  Hip flexion   Hip extension Thomas position: P: - 40*  Hip abduction   Hip adduction   Hip internal rotation   Hip external rotation   Knee flexion   Knee extension   Ankle dorsiflexion   Ankle plantarflexion   Ankle inversion   Ankle eversion    (Blank rows = not tested)  LOWER EXTREMITY MMT:  MMT Right eval Left eval  Hip flexion 3/5 3/5  Hip extension 3/5 3-/5  Hip abduction  3-/5  Hip adduction    Hip internal rotation    Hip external rotation    Knee flexion    Knee extension 3/5   Ankle dorsiflexion    Ankle plantarflexion    Ankle inversion    Ankle eversion    At Evaluation all strength testing is grossly seated and functionally standing / gait. (Blank rows = not tested)  BED MOBILITY:  Sit to supine Complete Independence Supine to sit Complete Independence Rolling to Right Complete Independence  TRANSFERS: 04/10/2024: Pt able stand to/from sit into chair without armrest using BUEs on seat and RW for stabilization with supervision/verbal cues.  Eval: Sit to stand: supervision requires a stable object to arise, stabilization w/ walker using UE support, with delayed weight shift onto the prosthesis for  stabilization, and uses UE to bring Lt prosthesis back below his body  Stand to sit: supervision requires a stable object to arise, stabilization w/ walker using UE support, with delayed weight shift onto the prosthesis for stabilization  FUNCTIONAL TESTs:  04/10/2024: Patient able to reach 10 forward and look over his shoulders with single upper extremity support on RW.   Eval:  BERG score 11/56  GAIT: 04/10/2024: Patient ambulated 125' with RW and supervision.   Patient negotiated 12*ramp and 6.5 curb with RW and supervision  Eval: Gait pattern: step to pattern, decreased step length- Right, decreased stance time- Left, decreased hip/knee flexion- Left, circumduction- Left, Left hip hike, antalgic, trendelenburg, lateral lean- Right, trunk flexed, abducted- Left, and poor foot clearance- Left Distance walked: 62 ft Assistive device utilized: Walker - 2 wheeled and AKA prosthesis Level of assistance: SBA / steady with RW support but needs cues  Gait velocity: 0.72 ft/sec Comments: excessive weightbearing on BUEs using RW  RAMP : not tested  CURB: not tested  CURRENT PROSTHETIC WEAR ASSESSMENT:   Patient is dependent with: skin check, residual limb care, care of non-amputated limb, prosthetic cleaning, ply sock cleaning, correct ply sock adjustment, proper wear schedule/adjustment, and proper weight-bearing schedule/adjustment Donning prosthesis: SBA / verbal cues Doffing prosthesis: Complete Independence Prosthetic wear tolerance: 6-8 hours, 1x/day, 7 days/week Prosthetic weight bearing tolerance: tolerated standing for 10 minutes with partial weight on prosthesis during BERG balance test with no increased pain. Although he reports standing pain with activities at home. Edema: minimal Residual limb condition: 3x58mm superficial wound distally located, scar with mild adherence, no adductor roll, normal skin color, decreased hair growth present Prosthetic description: silicone liner  with suction  ring suspension, ischial containment socket with flexible inner socket, rotator unit, SAFETY (single axis friction engaging with an extension assist), flexible keel foot  TODAY'S TREATMENT:                                                                                                                    DATE: 05/29/2024 Prosthetic Training with Transfemoral Prosthesis Patient continues to have right anterior tibial pain.  PT spoke with daughter due to the end of the session agrees with PT that is probably overuse stress related pain.  Dr. Harden did note some calcification to the interosseous ligament.   PT recommended increasing wear of prosthesis from arising in the morning until going to bed in the evening.  He can switch and clean liner with bathing.  PT discussed fall risk when prosthesis is off and stress to his right lower extremity.  Patient reports that he currently only has 1 prosthetic liner.  PT spoke with prosthetist who is working with the company to replace the second 1 that tore and was still under the warranty.  PT also recommended discussing getting a new insert or support supportive shoe for his right foot as his current shoe can also be a part of the pain problem he is having.  Patient wife verbalized understanding of above. PT demo and verbal cues on how to transfer standing to the floor and back to upright using BUEs on chair seat.  If he falls he needs to check first that he did not break anything and if so call 911.  Secondly he needs to check that the prosthetic socket did not rotate on his limb and if so correct it prior to trying to stand.  Patient was able to demonstrate transfer to the floor and back to standing via half kneeling using the chair with bilateral upper extremity support with cues and contact-guard assistance for safety.   TREATMENT:                                                                                                                    DATE:  05/24/2024 Prosthetic Training with Transfemoral Prosthesis PT discussed pain in Rt LE, potential causes, and then performed a skin check. PT noting several scabs throughout LE, that has also been noted and cleared by primary care MD, dry skin, and is highly painful to palpation and ankle ROM (DF and PF). PT discussed importance of following up with MD in order to improve pain levels as it is currently impacting PT,  ambulation, and personal care. Patient also notes that the pain is similar to pain in Lt LE prior to amputation and PT endorsed seeing MD. Patient and spouse acknowledged and agreed.  Patient was scheduled for same day appointment with Maurilio Collet, PA-C in Dr. Crist office in order to clear patient.  PT discussed importance of posture then had patient perform seated rows with red TB. Patient requiring tactile and verbal cues for appropriate form with minimal carryover. Activity was discontinued secondary to above noted sam-day appointment.    TREATMENT:                                                                                                                    DATE: 05/22/2024 Prosthetic Training with Transfemoral Prosthesis PT discussed prosthesis wear time with patient as patient endorses intermittent difficulty secondary to sensation of tightness and feeling like it is going to explode. Patient notes ability to wear for 6-7 hours 2x/day on some days, though on difficult days he can only don for 2-3 hours at a time. Patient endorses sensation of ice cold and pale skin on residual limb when doffing prosthesis and liner that can last up to 40-45 minutes and will improve when covering it up with a blanket to rewarm.  Patient ambulated 1x40' with RW and SBA-SUP from PT for reassessment following 3-week break from PT. Patient performed with increased Lt abduction, intermittent Lt toe drag during swing phase, and Lt femoral ER throughout.  Patient then performed 1x40' with SPC with  quad tip and CGA from PT. Patient did not have any moments of instability with forward ambulation, though continued to have deviations that were noted with RW.  PT discussed ladder safety and appropriate form when ascending and descending ladder with patient and spouse. Patient and spouse acknowledged and agreed.  PT discussed follow up with MD in order to address high levels of pain in intact limb as patient endorses sensations similar to Lt LE when it had the infection that began the amputation process. Patient and spouse acknowledged and agreed.   Neuro Re-Ed:  PT discussed balance changes that occur with a prosthesis and the decrease of proprioceptive feedback from LE musculature. PT educated on how patient will continue to receive feedback from residual limb, however, it takes time for body to learn from residual limb and it is a different sort of input compared to typical. Patient acknowledged. Patient performed standing punch with blue TB to challenge standing balance with equal weightbearing with CGA from PT throughout. Patient endorsed ease of performance with blue TB which then PT discontinued. Patient then performed reactive balance while walking fwd/backward with bilat handles on cable machine each at 10# each and CGA-max A throughout. Max A required one time with Rt LOB during backward ambulation toward machine. PT discontinued following third attempt.     TREATMENT:  DATE: 04/26/2024 Prosthetic Training with Transfemoral Prosthesis  Patient performed several sit<>stands throughout session with SUP>SBA using both RW and SPC with quad tip. Patient able to verbalize appropriate placement of SPC with quad tip during sit<>stands. Patient ambulating throughout session with SPC with quad tip and CGA for 51'9, 20', and 66'. Patient performing with improved forward gaze this date but  continues to have forward lean and thoracic kyphosis. PT discussed and educated patient on increased energy expenditure post-amputation as well as ambulating with SPC vs RW.   Neuro Re-Ed:  Patient performed standing taps to light up pods on window to challenge static balance, reaching outside of BOS, and reaction time. Patient performing 30s of each activity with CGA and no required increased physical assist or overt LOB.  Normal stance, narrow stance, semi-tandem stance with Rt LE back, semi-tandem with Lt LE back, semi-tandem with Lt LE back using only Rt hand, and semi-tandem Lt LE back using only Lt hand  Patient performing tossing and catching for 4x30s in parallel bars with CGA throughout while standing in typical stance, narrow stance, and 2x in semi-tandem with Lt LE back. Intermittent posterolateral LOB with need for increase in physical assist to at least mod A, though patient able to use bilat UE on parallel bars.     PATIENT EDUCATION: PATIENT EDUCATED ON FOLLOWING PROSTHETIC CARE: Education details: Sitting positioning to limit skin traction from weight of prosthesis, how hip flexion contracture effects residual limb pain & posture,  Prosthetic cleaning and Propper donning  Person educated: Patient, Spouse, and close family friend Education method: Explanation, Demonstration, Tactile cues, and Verbal cues Education comprehension: verbalized understanding and needs further education  HOME EXERCISE PROGRAM: Access Code: [email or web address] URL: [email or web address] Date: 03/20/2024 Prepared by: Grayce Spatz  Exercises - Modified Thomas Stretch  - 2 x daily - 7 x weekly - 1 sets - 2-3 reps - 30 seconds hold - Hip Flexor Stretch at Edge of Bed  - 2 x daily - 7 x weekly - 1 sets - 2-3 reps - 30 seconds hold - Standing posture with back to counter  - 2 x daily - 7 x weekly - 1 sets - 1 reps - 5-10 minutes hold - Upright Stance at Door Frame Single Arm  - 3-6 x daily - 7 x  weekly - 1 sets - 2 reps - 2 deep breathes hold - Upright Stance at Door Frame with Both Arms  - 3-6 x daily - 7 x weekly - 1 sets - 2 reps - 2 deep breathes hold  Do each exercise 1-2  times per day Do each exercise 5-10 repetitions Hold each exercise for 2 seconds to feel your location  AT SINK FIND YOUR MIDLINE POSITION AND PLACE FEET EQUAL DISTANCE FROM THE MIDLINE.  Try to find this position when standing still for activities.   USE TAPE ON FLOOR TO MARK THE MIDLINE POSITION which is even with middle of sink.  You also should try to feel with your limb pressure in socket.  You are trying to feel with limb what you used to feel with the bottom of your foot.  Side to Side Shift: Moving your hips only (not shoulders): move weight onto your left leg, HOLD/FEEL pressure in socket.  Move back to equal weight on each leg, HOLD/FEEL pressure in socket. Move weight onto your right leg, HOLD/FEEL pressure in socket. Move back to equal weight on each leg, HOLD/FEEL pressure in socket. Repeat.  Start with both hands on  sink, progress to hand on prosthetic side only, then no hands.  Front to Back Shift: Moving your hips only (not shoulders): move your weight forward onto your toes, HOLD/FEEL pressure in socket. Move your weight back to equal Flat Foot on both legs, HOLD/FEEL  pressure in socket. Move your weight back onto your heels, HOLD/FEEL  pressure in socket. Move your weight back to equal on both legs, HOLD/FEEL  pressure in socket. Repeat.  Start with both hands on sink, progress to hand on prosthetic side only, then no hands.  Moving Cones / Cups: With equal weight on each leg: Hold on with one hand the first time, then progress to no hand supports. Move cups from one side of sink to the other. Place cups ~2" out of your reach, progress to 10" beyond reach.  Place one hand in middle of sink and reach with other hand. Do both arms.  Then hover one hand and move cups with other hand.  Overhead/Upward  Reaching: alternated reaching up to top cabinets or ceiling if no cabinets present. Keep equal weight on each leg. Start with one hand support on counter while other hand reaches and progress to no hand support with reaching.  ace one hand in middle of sink and reach with other hand. Do both arms.  Then hover one hand and move cups with other hand.  5.   Looking Over Shoulders: With equal weight on each leg: alternate turning to look over your shoulders with one hand support on counter as needed.  Start with head motions only to look in front of shoulder, then even with shoulder and progress to looking behind you. To look to side, move head /eyes, then shoulder on side looking pulls back, shift more weight to side looking and pull hip back. Place one hand in middle of sink and let go with other hand so your shoulder can pull back. Switch hands to look other way.   Then hover one hand and look over shoulder. If looking right, use left hand at sink. If looking left, use right hand at sink. 6.  Stepping with leg that is not amputated:  Move items under cabinet out of your way. Shift your hips/pelvis so weight on prosthesis. Tighten muscles in hip on prosthetic side.  SLOWLY step other leg so front of foot is in cabinet. Then step back to floor.    ASSESSMENT:  CLINICAL IMPRESSION:    Patient and wife appear to understand PT recommendations for wearing prosthesis all awake hours to reduce stress on his right leg.  Patient appears to have a better understanding how to get on and off the floor.  Patient will continue to benefit from skilled PT.  OBJECTIVE IMPAIRMENTS: Abnormal gait, decreased activity tolerance, decreased balance, decreased endurance, decreased knowledge of condition, decreased knowledge of use of DME, decreased mobility, difficulty walking, decreased ROM, decreased strength, impaired flexibility, postural dysfunction, prosthetic dependency , and pain.   ACTIVITY LIMITATIONS: carrying,  lifting, bending, sitting, standing, squatting, stairs, transfers, bathing, toileting, dressing, reach over head, and locomotion level  PARTICIPATION LIMITATIONS: meal prep, cleaning, laundry, driving, shopping, community activity, and yard work  PERSONAL FACTORS: Age, Fitness, Past/current experiences, Time since onset of injury/illness/exacerbation, and 3+ comorbidities: see PMH are also affecting patient's functional outcome.   REHAB POTENTIAL: Good  CLINICAL DECISION MAKING: Evolving/moderate complexity  EVALUATION COMPLEXITY: Moderate   GOALS: Goals reviewed with patient? Yes  SHORT TERM GOALS: Target date: 04/05/2024  Patient donnes prosthesis modified independent &  verbalizes proper cleaning. Baseline: SEE OBJECTIVE DATA Goal status: MET, 04/10/2024  2.  Patient tolerates prosthesis >12 hrs total /day without skin issues or limb pain >7/10 after standing. Baseline: patient reports wearing 4-5 hr/day Goal status: NOT MET, 04/10/2024  3.  Patient able to reach 7 and look over both shoulders with RW support with Modified independent. Baseline: SEE OBJECTIVE DATA Goal status: MET, 04/10/2024  4. Patient ambulates 125' with RW & prosthesis with supervision. Baseline: SEE OBJECTIVE DATA Goal status: MET, 04/10/2024  5. Patient negotiates ramps & curbs with RW & prosthesis with supervision. Baseline: SEE OBJECTIVE DATA Goal status: MET, 04/10/2024  LONG TERM GOALS: Target date: 05/31/2024  Patient demonstrates & verbalized understanding of prosthetic care to enable safe utilization of prosthesis. Baseline: SEE OBJECTIVE DATA Goal status: MET 05/29/2024  Patient tolerates prosthesis wear >90% of awake hours without skin issues or limb pain >4/10. Baseline: SEE OBJECTIVE DATA Goal status: Ongoing    05/29/2024  BERG >/= 25/56 to indicate lower fall risk Baseline: SEE OBJECTIVE DATA Goal status: Ongoing   05/29/2024  Patient ambulates >300' with prosthesis and LRAD  independently Baseline: SEE OBJECTIVE DATA Goal status: Ongoing   05/29/2024  Patient negotiates ramps, curbs & stairs with single rail with prosthesis and LRAD independently. Baseline: SEE OBJECTIVE DATA Goal status: Ongoing    05/29/2024  6.  Patient reports Patient-Specific Activity Score improved the average by 3 to indicate improvement in functional activities.   Baseline: SEE OBJECTIVE DATA Goal status: Ongoing  05/29/2024  PLAN:  PT FREQUENCY: 2x/week  PT DURATION: 12 weeks  PLANNED INTERVENTIONS: 97164- PT Re-evaluation, 97750- Physical Performance Testing, 97110-Therapeutic exercises, 97530- Therapeutic activity, 97112- Neuromuscular re-education, 97535- Self Care, 02859- Manual therapy, [phone]- Gait training, [phone]- Prosthetic Initial , [phone]- Orthotic/Prosthetic subsequent, Patient/Family education, Balance training, Stair training, Joint mobilization, and DME instructions  PLAN FOR NEXT SESSION: Check long-term goals and discuss discharge versus recertification.   Grayce Spatz, PT, DPT 05/29/2024, 2:32 PM   Date of referral: 02/13/2024 Referring provider: Harden Jerona GAILS, MD Referring diagnosis? S10.387 (ICD-10-CM) - Hx of AKA (above knee amputation), left (HCC)  Treatment diagnosis? (if different than referring diagnosis) R26.89, M62.81, M79.605, M25.652, R29.3, R26.81, L98.498  What was this (referring dx) caused by? Other: Lt AKA  Nature of Condition: Initial Onset (within last 3 months)   Laterality: Lt  Current Functional Measure Score: Patient Specific Functional Scale Score:4  Objective measurements identify impairments when they are compared to normal values, the uninvolved extremity, and prior level of function.  [x]  Yes  []  No  Objective assessment of functional ability: Moderate functional limitations   Briefly describe symptoms: Patient is dependent in prosthetic care and use with high risk of falls. Gait deviation, muscle weakness, pain in Lt  residual limb, hip flexion contracture.  How did symptoms start: amputation due to vascular issues  Average pain intensity:  Last 24 hours: 8-9/10  Past week: 8-9/10  How often does the pt experience symptoms? Constantly  How much have the symptoms interfered with usual daily activities? Quite a bit  How has condition changed since care began at this facility? NA - initial visit  In general, how is the patients overall health? Good   BACK PAIN (STarT Back Screening Tool) No

## 2024-05-30 NOTE — Therapy (Signed)
 OUTPATIENT PHYSICAL THERAPY PROSTHETIC TREATMENT   Patient Name: Mike Floyd MRN: [id number] DOB:1961/03/09, 63 y.o., male Today's Date: 05/30/2024  END OF SESSION:       Past Medical History:  Diagnosis Date   AICD (automatic cardioverter/defibrillator) present 11/2007   Removed in 2018, a. 11/2007 [facility] Current VR - single lead ICD  - Removed 2018 - it was burning me   Anxiety    CAD (coronary artery disease)    non-obstructive CAD by Cor CT in 2020 // Myoview  3/22: EF 57, small inf-sept defect c/w scar, no ischemia; low risk      Chest pain    a. 10/2007 Cath:  normal Cors.   CKD (chronic kidney disease), stage II    DDD (degenerative disc disease), lumbar    Diabetes mellitus DX: 2010   type 2   Erosive esophagitis    a. per EGD (08/2011), Dr. Golda - Erosive reflux esophagitis improved but not completely healed since previous EGD 3 years ago. Bx showing  ulcerated gatroesophageal junction mucosa. negative for H. pylori   GERD (gastroesophageal reflux disease)    Gout    Hearing deficit    a. wear bilateral hearing aides   History of hiatal hernia    History of kidney stones    passed stones, no surgery   Hypertension    Mildly dilatd aortic root (HCC)    CMR 4/22: EF 52, no LGE; d/w Dr. Ione root 38 mm (mildly dilated)   Myocardial infarction [facility]) 2011   Neuropathy    Feet and legs   Nonischemic dilated cardiomyopathy (HCC)    a. H/O EF as low as 35-40% by LV gram 10/2007;  b. Echo 02/2011 EF 50-55%, inf HK, Gr 1 DD // CMR 4/22: EF 52, no LGE; d/w Dr. Ione root 38 mm (mildly dilated)    Renal insufficiency    Sleep apnea    pt doesnt use, statesI cant afford one. PCP aware   Stroke (HCC)    mini-stroke in 2014   TIA (transient ischemic attack)    July, 2013   Tobacco abuse, in remission 06/27/2009   Discontinued in 2009     Wears dentures    top plate   WPW (Wolff-Parkinson-White syndrome)    a. s/p RFCA @ [facility] - 1999   Past Surgical  History:  Procedure Laterality Date   AMPUTATION Left 05/19/2022   Procedure: TRANSMETATARSAL AMPUTATION LEFT FOOT;  Surgeon: Harden Jerona GAILS, MD;  Location: [facility] OR;  Service: Orthopedics;  Laterality: Left;   AMPUTATION Left 06/17/2023   Procedure: LEFT BELOW KNEE AMPUTATION;  Surgeon: Harden Jerona GAILS, MD;  Location: [facility] OR;  Service: Orthopedics;  Laterality: Left;   AMPUTATION Left 08/19/2023   Procedure: LEFT ABOVE KNEE AMPUTATION;  Surgeon: Harden Jerona GAILS, MD;  Location: [facility] OR;  Service: Orthopedics;  Laterality: Left;   APPENDECTOMY     BACK SURGERY     1995   CARDIAC CATHETERIZATION     2009   CARDIAC DEFIBRILLATOR PLACEMENT     ICD was removed in 2018   CHOLECYSTECTOMY     CHOLECYSTECTOMY, LAPAROSCOPIC     11/2007   COLONOSCOPY W/ POLYPECTOMY  2009   ELBOW SURGERY Left 06/2010   ESOPHAGEAL DILATION N/A 11/08/2014   Procedure: ESOPHAGEAL DILATION;  Surgeon: Claudis RAYMOND Golda, MD;  Location: [facility] ORS;  Service: Endoscopy;  Laterality: N/A;  #56,    ESOPHAGOGASTRODUODENOSCOPY  03/31/2012   also 08/2011; [name]   ESOPHAGOGASTRODUODENOSCOPY (EGD) WITH PROPOFOL   N/A 11/08/2014   Procedure: ESOPHAGOGASTRODUODENOSCOPY (EGD) WITH PROPOFOL ;  Surgeon: Claudis RAYMOND Rivet, MD;  Location: [facility] ORS;  Service: Endoscopy;  Laterality: N/A;  Hiatus is 14 , GE Junction is 37   FOOT ARTHRODESIS Left 10/24/2020   Procedure: LEFT GASTROCNEMIUS RECESSION, DORSIFLEXION OSTEOTOMY 1ST MT;  Surgeon: Harden Jerona GAILS, MD;  Location: [facility] OR;  Service: Orthopedics;  Laterality: Left;   FOOT ARTHRODESIS Right 01/09/2021   Procedure: CLOSING WEDGE OSTEOTOMY RIGHT 1ST METATARSAL;  Surgeon: Harden Jerona GAILS, MD;  Location: [facility] OR;  Service: Orthopedics;  Laterality: Right;   GASTROCNEMIUS RECESSION Right 01/09/2021   Procedure: RIGHT GASTROCNEMIUS RECESSION;  Surgeon: Harden Jerona GAILS, MD;  Location: [facility] OR;  Service: Orthopedics;  Laterality: Right;   ICD LEAD REMOVAL N/A 10/07/2016   Procedure: ICD LEAD REMOVAL ;  Surgeon: Danelle LELON Birmingham, MD;  Location: [facility] OR;  Service: Cardiovascular;  Laterality: N/A;   MULTIPLE TOOTH EXTRACTIONS     RADIOFREQUENCY ABLATION  1999   WPW; performed at [facility]   STUMP REVISION Left 07/22/2023   Procedure: REVISION LEFT BELOW KNEE AMPUTATION;  Surgeon: Harden Jerona GAILS, MD;  Location: [facility] OR;  Service: Orthopedics;  Laterality: Left;   TEE WITHOUT CARDIOVERSION N/A 10/07/2016   Procedure: TRANSESOPHAGEAL ECHOCARDIOGRAM (TEE);  Surgeon: Danelle LELON Birmingham, MD;  Location: [facility] OR;  Service: Cardiovascular;  Laterality: N/A;   Patient Active Problem List   Diagnosis Date Noted   Dyslipidemia 11/03/2023   Essential hypertension 11/03/2023   Type 2 diabetes mellitus with peripheral neuropathy (HCC) 11/03/2023   BPH (benign prostatic hyperplasia) 11/03/2023   GERD without esophagitis 11/03/2023   S/P AKA (above knee amputation) unilateral, left (HCC) 08/19/2023   Dehiscence of amputation stump of left lower extremity (HCC) 07/22/2023   Ischemia of site of left below knee amputation (HCC) 07/22/2023   S/P BKA (below knee amputation) unilateral, left (HCC) 07/22/2023   Below-knee amputation of left lower extremity (HCC) 06/17/2023   Acute osteomyelitis of metatarsal bone of left foot (HCC)    Subacute osteomyelitis, left ankle and foot (HCC) 05/13/2022   Non-pressure chronic ulcer of other part of right foot limited to breakdown of skin (HCC)    Contracture of right Achilles tendon    Mildly dilatd aortic root (HCC)    Contracture of left Achilles tendon    Metatarsal deformity, left    CAD (coronary artery disease)    Hallux hammertoe, left 12/11/2019   Diarrhea 06/27/2019   Diastasis recti 08/22/2018   Umbilical hernia without obstruction and without gangrene 08/22/2018   Vitamin D  deficiency 06/23/2017   Essential hypertension, benign 06/15/2017   Mixed hyperlipidemia 06/15/2017   Class 1 obesity due to excess calories with serious comorbidity and body mass index (BMI) of 31.0 to 31.9  in adult 06/15/2017   Malfunction of implantable cardioverter-defibrillator (ICD) electrode 10/07/2016   Chronic kidney disease (CKD), stage III (moderate) (HCC) 04/04/2015   Bladder neck obstruction 03/21/2015   Difficult or painful urination 03/21/2015   Flank pain 03/21/2015   Delayed onset of urination 03/21/2015   Calculus of kidney 03/21/2015   Erosive esophagitis 12/27/2013   Hypotension due to drugs 07/02/2013   Chronic systolic heart failure (HCC) 04/24/2013   CAP (community acquired pneumonia) 03/16/2013   HTN (hypertension) 03/15/2013   Hydrocele 03/15/2013   Spermatocele 03/15/2013   DOE (dyspnea on exertion) 03/15/2013   OSA (obstructive sleep apnea) 01/09/2013   Chronic kidney disease, stage 3 (HCC) 01/03/2013   Chest pain 12/26/2012  History of diagnostic tests 09/06/2012   TIA (transient ischemic attack)    Cardiomyopathy, nonischemic (HCC) 02/24/2010   AICD (automatic cardioverter/defibrillator) 02/24/2010   DM type 2 causing vascular disease (HCC) 06/27/2009   Gout 06/27/2009   Tobacco abuse, in remission 06/27/2009   COLONIC POLYPS 10/31/2008   GERD (gastroesophageal reflux disease) 10/31/2008    PCP: Shona Norleen PEDLAR, MD  REFERRING PROVIDER: Bertell Satterfield, MD  REFERRING DIAG: [phone] (ICD-10-CM) - Hx of AKA (above knee amputation), left (HCC)   THERAPY DIAG:  No diagnosis found.  Rationale for Evaluation and Treatment: Rehabilitation  ONSET DATE: Prosthesis delivery 02/20/2024  SUBJECTIVE:   SUBJECTIVE STATEMENT:   *** His right leg is still painful. He reports fall 2 weeks ago landing on his buttocks.    PERTINENT HISTORY: DM2, GERD, hypertension, dyslipidemia, CAD, L BKA (06/17/23), neuropathy  Eval: Patient is a 63 year old male who presents to physical therapy with a left AKA (08/19/2023) with a new prosthesis delivery on 02/20/2024. Patient had previous home health PT prior to Lt AKA. He is able to mobilize with modified independence w/ walker.  Wears prosthesis about ten hours a day and by the end of the night he takes it off due to fatigue and pain.   PAIN:  Are you having pain? Yes: NPRS scale: up to 3-4/10 Pain location: Femur in prosthesis and pain in the Rt leg as well Pain description: feels like its been hammered Aggravating factors: keeping prosthesis on for long periods of times, sitting Relieving factors: taking off prosthesis and medications  PRECAUTIONS: Fall  RED FLAGS: None   WEIGHT BEARING RESTRICTIONS: No  FALLS:  Has patient fallen in last 6 months? Yes. Number of falls 2, forgetting he doesn't have a leg and put weight onto the left side. He did not hit the floor, only fell into another person. Occurred when transferring from sitting to standing  LIVING ENVIRONMENT: Lives with: lives with their spouse Lives in: Other Trailer on grass Stairs: Yes: External: 4 steps; can reach both Has following equipment at home: Single point cane, Environmental consultant - 2 wheeled, Wheelchair (manual), and shower chair  OCCUPATION: retired   PLOF: Independent with household mobility with device  PATIENT GOALS: ambulating and ADLs with cane   NEXT MD VISIT: Office MD visit: 03/21/2024   OBJECTIVE:  Patient-Specific Activity Scoring Scheme  0 represents "unable to perform." 10 represents "able to perform at prior level. 0 1 2 3 4 5 6 7 8 9  10 (Date and Score)   Activity Eval     1. Walking w/ cane  2    2. Standing for ADLs  5    3. Stairs 5   4.    5.    Score 4    Total score = sum of the activity scores/number of activities Minimum detectable change (90%CI) for average score = 2 points Minimum detectable change (90%CI) for single activity score = 3 points  DIAGNOSTIC FINDINGS: none post-amputation  COGNITION: Overall cognitive status: Within functional limits for tasks assessed   SENSATION: WFL  MUSCLE LENGTH:   POSTURE: rounded shoulders, forward head, flexed trunk , and weight shift right  LOWER  EXTREMITY ROM:  ROM P:passive  A:active Left eval  Hip flexion   Hip extension Thomas position: P: - 40*  Hip abduction   Hip adduction   Hip internal rotation   Hip external rotation   Knee flexion   Knee extension   Ankle dorsiflexion   Ankle plantarflexion  Ankle inversion   Ankle eversion    (Blank rows = not tested)  LOWER EXTREMITY MMT:  MMT Right eval Left eval  Hip flexion 3/5 3/5  Hip extension 3/5 3-/5  Hip abduction  3-/5  Hip adduction    Hip internal rotation    Hip external rotation    Knee flexion    Knee extension 3/5   Ankle dorsiflexion    Ankle plantarflexion    Ankle inversion    Ankle eversion    At Evaluation all strength testing is grossly seated and functionally standing / gait. (Blank rows = not tested)  BED MOBILITY:  Sit to supine Complete Independence Supine to sit Complete Independence Rolling to Right Complete Independence  TRANSFERS: 04/10/2024: Pt able stand to/from sit into chair without armrest using BUEs on seat and RW for stabilization with supervision/verbal cues.  Eval: Sit to stand: supervision requires a stable object to arise, stabilization w/ walker using UE support, with delayed weight shift onto the prosthesis for stabilization, and uses UE to bring Lt prosthesis back below his body  Stand to sit: supervision requires a stable object to arise, stabilization w/ walker using UE support, with delayed weight shift onto the prosthesis for stabilization  FUNCTIONAL TESTs:  04/10/2024: Patient able to reach 10 forward and look over his shoulders with single upper extremity support on RW.   Eval:  BERG score 11/56  GAIT: 04/10/2024: Patient ambulated 125' with RW and supervision.   Patient negotiated 12*ramp and 6.5 curb with RW and supervision  Eval: Gait pattern: step to pattern, decreased step length- Right, decreased stance time- Left, decreased hip/knee flexion- Left, circumduction- Left, Left hip hike, antalgic,  trendelenburg, lateral lean- Right, trunk flexed, abducted- Left, and poor foot clearance- Left Distance walked: 62 ft Assistive device utilized: Walker - 2 wheeled and AKA prosthesis Level of assistance: SBA / steady with RW support but needs cues  Gait velocity: 0.72 ft/sec Comments: excessive weightbearing on BUEs using RW  RAMP : not tested  CURB: not tested  CURRENT PROSTHETIC WEAR ASSESSMENT:   Patient is dependent with: skin check, residual limb care, care of non-amputated limb, prosthetic cleaning, ply sock cleaning, correct ply sock adjustment, proper wear schedule/adjustment, and proper weight-bearing schedule/adjustment Donning prosthesis: SBA / verbal cues Doffing prosthesis: Complete Independence Prosthetic wear tolerance: 6-8 hours, 1x/day, 7 days/week Prosthetic weight bearing tolerance: tolerated standing for 10 minutes with partial weight on prosthesis during BERG balance test with no increased pain. Although he reports standing pain with activities at home. Edema: minimal Residual limb condition: 3x16mm superficial wound distally located, scar with mild adherence, no adductor roll, normal skin color, decreased hair growth present Prosthetic description: silicone liner with suction ring suspension, ischial containment socket with flexible inner socket, rotator unit, SAFETY (single axis friction engaging with an extension assist), flexible keel foot  TODAY'S TREATMENT:  DATE: 05/31/2024 ***  TODAY'S TREATMENT:                                                                                                                    DATE: 05/29/2024 Prosthetic Training with Transfemoral Prosthesis Patient continues to have right anterior tibial pain.  PT spoke with daughter due to the end of the session agrees with PT that is probably overuse stress related pain.  Dr. Harden  did note some calcification to the interosseous ligament.   PT recommended increasing wear of prosthesis from arising in the morning until going to bed in the evening.  He can switch and clean liner with bathing.  PT discussed fall risk when prosthesis is off and stress to his right lower extremity.  Patient reports that he currently only has 1 prosthetic liner.  PT spoke with prosthetist who is working with the company to replace the second 1 that tore and was still under the warranty.  PT also recommended discussing getting a new insert or support supportive shoe for his right foot as his current shoe can also be a part of the pain problem he is having.  Patient wife verbalized understanding of above. PT demo and verbal cues on how to transfer standing to the floor and back to upright using BUEs on chair seat.  If he falls he needs to check first that he did not break anything and if so call 911.  Secondly he needs to check that the prosthetic socket did not rotate on his limb and if so correct it prior to trying to stand.  Patient was able to demonstrate transfer to the floor and back to standing via half kneeling using the chair with bilateral upper extremity support with cues and contact-guard assistance for safety.   TREATMENT:                                                                                                                    DATE: 05/24/2024 Prosthetic Training with Transfemoral Prosthesis PT discussed pain in Rt LE, potential causes, and then performed a skin check. PT noting several scabs throughout LE, that has also been noted and cleared by primary care MD, dry skin, and is highly painful to palpation and ankle ROM (DF and PF). PT discussed importance of following up with MD in order to improve pain levels as it is currently impacting PT, ambulation, and personal care. Patient also notes that the pain is similar to pain in Lt LE prior to amputation  and PT endorsed seeing MD. Patient  and spouse acknowledged and agreed.  Patient was scheduled for same day appointment with Maurilio Collet, PA-C in Dr. Crist office in order to clear patient.  PT discussed importance of posture then had patient perform seated rows with red TB. Patient requiring tactile and verbal cues for appropriate form with minimal carryover. Activity was discontinued secondary to above noted sam-day appointment.    TREATMENT:                                                                                                                    DATE: 05/22/2024 Prosthetic Training with Transfemoral Prosthesis PT discussed prosthesis wear time with patient as patient endorses intermittent difficulty secondary to sensation of tightness and feeling like it is going to explode. Patient notes ability to wear for 6-7 hours 2x/day on some days, though on difficult days he can only don for 2-3 hours at a time. Patient endorses sensation of ice cold and pale skin on residual limb when doffing prosthesis and liner that can last up to 40-45 minutes and will improve when covering it up with a blanket to rewarm.  Patient ambulated 1x40' with RW and SBA-SUP from PT for reassessment following 3-week break from PT. Patient performed with increased Lt abduction, intermittent Lt toe drag during swing phase, and Lt femoral ER throughout.  Patient then performed 1x40' with SPC with quad tip and CGA from PT. Patient did not have any moments of instability with forward ambulation, though continued to have deviations that were noted with RW.  PT discussed ladder safety and appropriate form when ascending and descending ladder with patient and spouse. Patient and spouse acknowledged and agreed.  PT discussed follow up with MD in order to address high levels of pain in intact limb as patient endorses sensations similar to Lt LE when it had the infection that began the amputation process. Patient and spouse acknowledged and agreed.   Neuro  Re-Ed:  PT discussed balance changes that occur with a prosthesis and the decrease of proprioceptive feedback from LE musculature. PT educated on how patient will continue to receive feedback from residual limb, however, it takes time for body to learn from residual limb and it is a different sort of input compared to typical. Patient acknowledged. Patient performed standing punch with blue TB to challenge standing balance with equal weightbearing with CGA from PT throughout. Patient endorsed ease of performance with blue TB which then PT discontinued. Patient then performed reactive balance while walking fwd/backward with bilat handles on cable machine each at 10# each and CGA-max A throughout. Max A required one time with Rt LOB during backward ambulation toward machine. PT discontinued following third attempt.     TREATMENT:  DATE: 04/26/2024 Prosthetic Training with Transfemoral Prosthesis  Patient performed several sit<>stands throughout session with SUP>SBA using both RW and SPC with quad tip. Patient able to verbalize appropriate placement of SPC with quad tip during sit<>stands. Patient ambulating throughout session with SPC with quad tip and CGA for 51'9, 20', and 66'. Patient performing with improved forward gaze this date but continues to have forward lean and thoracic kyphosis. PT discussed and educated patient on increased energy expenditure post-amputation as well as ambulating with SPC vs RW.   Neuro Re-Ed:  Patient performed standing taps to light up pods on window to challenge static balance, reaching outside of BOS, and reaction time. Patient performing 30s of each activity with CGA and no required increased physical assist or overt LOB.  Normal stance, narrow stance, semi-tandem stance with Rt LE back, semi-tandem with Lt LE back, semi-tandem with Lt LE back using only Rt  hand, and semi-tandem Lt LE back using only Lt hand  Patient performing tossing and catching for 4x30s in parallel bars with CGA throughout while standing in typical stance, narrow stance, and 2x in semi-tandem with Lt LE back. Intermittent posterolateral LOB with need for increase in physical assist to at least mod A, though patient able to use bilat UE on parallel bars.     PATIENT EDUCATION: PATIENT EDUCATED ON FOLLOWING PROSTHETIC CARE: Education details: Sitting positioning to limit skin traction from weight of prosthesis, how hip flexion contracture effects residual limb pain & posture,  Prosthetic cleaning and Propper donning  Person educated: Patient, Spouse, and close family friend Education method: Explanation, Demonstration, Tactile cues, and Verbal cues Education comprehension: verbalized understanding and needs further education  HOME EXERCISE PROGRAM: Access Code: [email or web address] URL: [email or web address] Date: 03/20/2024 Prepared by: Grayce Spatz  Exercises - Modified Thomas Stretch  - 2 x daily - 7 x weekly - 1 sets - 2-3 reps - 30 seconds hold - Hip Flexor Stretch at Edge of Bed  - 2 x daily - 7 x weekly - 1 sets - 2-3 reps - 30 seconds hold - Standing posture with back to counter  - 2 x daily - 7 x weekly - 1 sets - 1 reps - 5-10 minutes hold - Upright Stance at Door Frame Single Arm  - 3-6 x daily - 7 x weekly - 1 sets - 2 reps - 2 deep breathes hold - Upright Stance at Door Frame with Both Arms  - 3-6 x daily - 7 x weekly - 1 sets - 2 reps - 2 deep breathes hold  Do each exercise 1-2  times per day Do each exercise 5-10 repetitions Hold each exercise for 2 seconds to feel your location  AT SINK FIND YOUR MIDLINE POSITION AND PLACE FEET EQUAL DISTANCE FROM THE MIDLINE.  Try to find this position when standing still for activities.   USE TAPE ON FLOOR TO MARK THE MIDLINE POSITION which is even with middle of sink.  You also should try to feel with your limb  pressure in socket.  You are trying to feel with limb what you used to feel with the bottom of your foot.  Side to Side Shift: Moving your hips only (not shoulders): move weight onto your left leg, HOLD/FEEL pressure in socket.  Move back to equal weight on each leg, HOLD/FEEL pressure in socket. Move weight onto your right leg, HOLD/FEEL pressure in socket. Move back to equal weight on each leg, HOLD/FEEL pressure in socket. Repeat.  Start with both hands on  sink, progress to hand on prosthetic side only, then no hands.  Front to Back Shift: Moving your hips only (not shoulders): move your weight forward onto your toes, HOLD/FEEL pressure in socket. Move your weight back to equal Flat Foot on both legs, HOLD/FEEL  pressure in socket. Move your weight back onto your heels, HOLD/FEEL  pressure in socket. Move your weight back to equal on both legs, HOLD/FEEL  pressure in socket. Repeat.  Start with both hands on sink, progress to hand on prosthetic side only, then no hands.  Moving Cones / Cups: With equal weight on each leg: Hold on with one hand the first time, then progress to no hand supports. Move cups from one side of sink to the other. Place cups ~2" out of your reach, progress to 10" beyond reach.  Place one hand in middle of sink and reach with other hand. Do both arms.  Then hover one hand and move cups with other hand.  Overhead/Upward Reaching: alternated reaching up to top cabinets or ceiling if no cabinets present. Keep equal weight on each leg. Start with one hand support on counter while other hand reaches and progress to no hand support with reaching.  ace one hand in middle of sink and reach with other hand. Do both arms.  Then hover one hand and move cups with other hand.  5.   Looking Over Shoulders: With equal weight on each leg: alternate turning to look over your shoulders with one hand support on counter as needed.  Start with head motions only to look in front of shoulder, then even  with shoulder and progress to looking behind you. To look to side, move head /eyes, then shoulder on side looking pulls back, shift more weight to side looking and pull hip back. Place one hand in middle of sink and let go with other hand so your shoulder can pull back. Switch hands to look other way.   Then hover one hand and look over shoulder. If looking right, use left hand at sink. If looking left, use right hand at sink. 6.  Stepping with leg that is not amputated:  Move items under cabinet out of your way. Shift your hips/pelvis so weight on prosthesis. Tighten muscles in hip on prosthetic side.  SLOWLY step other leg so front of foot is in cabinet. Then step back to floor.    ASSESSMENT:  CLINICAL IMPRESSION:    *** Patient and wife appear to understand PT recommendations for wearing prosthesis all awake hours to reduce stress on his right leg.  Patient appears to have a better understanding how to get on and off the floor.  Patient will continue to benefit from skilled PT.  OBJECTIVE IMPAIRMENTS: Abnormal gait, decreased activity tolerance, decreased balance, decreased endurance, decreased knowledge of condition, decreased knowledge of use of DME, decreased mobility, difficulty walking, decreased ROM, decreased strength, impaired flexibility, postural dysfunction, prosthetic dependency , and pain.   ACTIVITY LIMITATIONS: carrying, lifting, bending, sitting, standing, squatting, stairs, transfers, bathing, toileting, dressing, reach over head, and locomotion level  PARTICIPATION LIMITATIONS: meal prep, cleaning, laundry, driving, shopping, community activity, and yard work  PERSONAL FACTORS: Age, Fitness, Past/current experiences, Time since onset of injury/illness/exacerbation, and 3+ comorbidities: see PMH are also affecting patient's functional outcome.   REHAB POTENTIAL: Good  CLINICAL DECISION MAKING: Evolving/moderate complexity  EVALUATION COMPLEXITY: Moderate   GOALS: Goals  reviewed with patient? Yes  SHORT TERM GOALS: Target date: 04/05/2024  Patient donnes prosthesis modified independent &  verbalizes proper cleaning. Baseline: SEE OBJECTIVE DATA Goal status: MET, 04/10/2024  2.  Patient tolerates prosthesis >12 hrs total /day without skin issues or limb pain >7/10 after standing. Baseline: patient reports wearing 4-5 hr/day Goal status: NOT MET, 04/10/2024  3.  Patient able to reach 7 and look over both shoulders with RW support with Modified independent. Baseline: SEE OBJECTIVE DATA Goal status: MET, 04/10/2024  4. Patient ambulates 125' with RW & prosthesis with supervision. Baseline: SEE OBJECTIVE DATA Goal status: MET, 04/10/2024  5. Patient negotiates ramps & curbs with RW & prosthesis with supervision. Baseline: SEE OBJECTIVE DATA Goal status: MET, 04/10/2024  LONG TERM GOALS: Target date: 05/31/2024  Patient demonstrates & verbalized understanding of prosthetic care to enable safe utilization of prosthesis. Baseline: SEE OBJECTIVE DATA Goal status: MET 05/29/2024  Patient tolerates prosthesis wear >90% of awake hours without skin issues or limb pain >4/10. Baseline: SEE OBJECTIVE DATA Goal status: Ongoing    05/29/2024  BERG >/= 25/56 to indicate lower fall risk Baseline: SEE OBJECTIVE DATA Goal status: Ongoing   05/29/2024  Patient ambulates >300' with prosthesis and LRAD independently Baseline: SEE OBJECTIVE DATA Goal status: Ongoing   05/29/2024  Patient negotiates ramps, curbs & stairs with single rail with prosthesis and LRAD independently. Baseline: SEE OBJECTIVE DATA Goal status: Ongoing    05/29/2024  6.  Patient reports Patient-Specific Activity Score improved the average by 3 to indicate improvement in functional activities.   Baseline: SEE OBJECTIVE DATA Goal status: Ongoing  05/29/2024  PLAN:  PT FREQUENCY: 2x/week  PT DURATION: 12 weeks  PLANNED INTERVENTIONS: 97164- PT Re-evaluation, 97750- Physical Performance  Testing, 97110-Therapeutic exercises, 97530- Therapeutic activity, 97112- Neuromuscular re-education, 97535- Self Care, 02859- Manual therapy, [phone]- Gait training, [phone]- Prosthetic Initial , [phone]- Orthotic/Prosthetic subsequent, Patient/Family education, Balance training, Stair training, Joint mobilization, and DME instructions  PLAN FOR NEXT SESSION: *** Check long-term goals and discuss discharge versus recertification.   Susannah Daring, PT, DPT 05/30/24 8:00 AM     Date of referral: 02/13/2024 Referring provider: Bertell Satterfield, MD Referring diagnosis? S10.387 (ICD-10-CM) - Hx of AKA (above knee amputation), left (HCC)  Treatment diagnosis? (if different than referring diagnosis) R26.89, M62.81, M79.605, M25.652, R29.3, R26.81, L98.498  What was this (referring dx) caused by? Other: Lt AKA  Nature of Condition: Initial Onset (within last 3 months)   Laterality: Lt  Current Functional Measure Score: Patient Specific Functional Scale Score:4  Objective measurements identify impairments when they are compared to normal values, the uninvolved extremity, and prior level of function.  [x]  Yes  []  No  Objective assessment of functional ability: Moderate functional limitations   Briefly describe symptoms: Patient is dependent in prosthetic care and use with high risk of falls. Gait deviation, muscle weakness, pain in Lt residual limb, hip flexion contracture.  How did symptoms start: amputation due to vascular issues  Average pain intensity:  Last 24 hours: 8-9/10  Past week: 8-9/10  How often does the pt experience symptoms? Constantly  How much have the symptoms interfered with usual daily activities? Quite a bit  How has condition changed since care began at this facility? NA - initial visit  In general, how is the patients overall health? Good   BACK PAIN (STarT Back Screening Tool) No

## 2024-05-31 ENCOUNTER — Ambulatory Visit

## 2024-05-31 ENCOUNTER — Other Ambulatory Visit: Payer: Self-pay | Admitting: Student

## 2024-05-31 DIAGNOSIS — M25652 Stiffness of left hip, not elsewhere classified: Secondary | ICD-10-CM

## 2024-05-31 DIAGNOSIS — M79605 Pain in left leg: Secondary | ICD-10-CM

## 2024-05-31 DIAGNOSIS — R293 Abnormal posture: Secondary | ICD-10-CM

## 2024-05-31 DIAGNOSIS — R2689 Other abnormalities of gait and mobility: Secondary | ICD-10-CM

## 2024-05-31 DIAGNOSIS — M6281 Muscle weakness (generalized): Secondary | ICD-10-CM

## 2024-05-31 DIAGNOSIS — R2681 Unsteadiness on feet: Secondary | ICD-10-CM

## 2024-05-31 DIAGNOSIS — L98498 Non-pressure chronic ulcer of skin of other sites with other specified severity: Secondary | ICD-10-CM

## 2024-06-06 ENCOUNTER — Encounter (HOSPITAL_COMMUNITY): Payer: Self-pay

## 2024-06-06 ENCOUNTER — Ambulatory Visit (HOSPITAL_COMMUNITY): Admission: RE | Admit: 2024-06-06 | Source: Ambulatory Visit

## 2024-06-13 ENCOUNTER — Ambulatory Visit: Admitting: Student

## 2024-06-13 LAB — COLOGUARD: COLOGUARD: POSITIVE — AB

## 2024-06-13 NOTE — Therapy (Signed)
 OUTPATIENT PHYSICAL THERAPY PROSTHETIC TREATMENT    Patient Name: Mike Floyd MRN: [id number] DOB:1960-11-05, 63 y.o., male Today's Date: 06/14/2024  END OF SESSION:  PT End of Session - 06/14/24 1102     Visit Number 17    Number of Visits 40    Date for Recertification  08/23/24    Authorization Type [employer] MEDICARE    Authorization Time Period $45 copay    Progress Note Due on Visit 20    PT Start Time 1102    PT Stop Time 1145    PT Time Calculation (min) 43 min    Equipment Utilized During Treatment Gait belt    Activity Tolerance Patient tolerated treatment well;Patient limited by fatigue    Behavior During Therapy WFL for tasks assessed/performed           Past Medical History:  Diagnosis Date   AICD (automatic cardioverter/defibrillator) present 11/2007   Removed in 2018, a. 11/2007 S[facility] Current VR - single lead ICD  - Removed 2018 - it was burning me   Anxiety    CAD (coronary artery disease)    non-obstructive CAD by Cor CT in 2020 // Myoview  3/22: EF 57, small inf-sept defect c/w scar, no ischemia; low risk      Chest pain    a. 10/2007 Cath:  normal Cors.   CKD (chronic kidney disease), stage II    DDD (degenerative disc disease), lumbar    Diabetes mellitus DX: 2010   type 2   Erosive esophagitis    a. per EGD (08/2011), Dr. Golda - Erosive reflux esophagitis improved but not completely healed since previous EGD 3 years ago. Bx showing  ulcerated gatroesophageal junction mucosa. negative for H. pylori   GERD (gastroesophageal reflux disease)    Gout    Hearing deficit    a. wear bilateral hearing aides   History of hiatal hernia    History of kidney stones    passed stones, no surgery   Hypertension    Mildly dilatd aortic root (HCC)    CMR 4/22: EF 52, no LGE; d/w Dr. Ione root 38 mm (mildly dilated)   Myocardial infarction [facility]) 2011   Neuropathy    Feet and legs   Nonischemic dilated cardiomyopathy (HCC)    a. H/O EF as low as 35-40% by LV  gram 10/2007;  b. Echo 02/2011 EF 50-55%, inf HK, Gr 1 DD // CMR 4/22: EF 52, no LGE; d/w Dr. Ione root 38 mm (mildly dilated)    Renal insufficiency    Sleep apnea    pt doesnt use, statesI cant afford one. PCP aware   Stroke (HCC)    mini-stroke in 2014   TIA (transient ischemic attack)    July, 2013   Tobacco abuse, in remission 06/27/2009   Discontinued in 2009     Wears dentures    top plate   WPW (Wolff-Parkinson-White syndrome)    a. s/p RFCA @ [facility] - 1999   Past Surgical History:  Procedure Laterality Date   AMPUTATION Left 05/19/2022   Procedure: TRANSMETATARSAL AMPUTATION LEFT FOOT;  Surgeon: Harden Jerona GAILS, MD;  Location: [facility] OR;  Service: Orthopedics;  Laterality: Left;   AMPUTATION Left 06/17/2023   Procedure: LEFT BELOW KNEE AMPUTATION;  Surgeon: Harden Jerona GAILS, MD;  Location: [facility] OR;  Service: Orthopedics;  Laterality: Left;   AMPUTATION Left 08/19/2023   Procedure: LEFT ABOVE KNEE AMPUTATION;  Surgeon: Harden Jerona GAILS, MD;  Location: [facility] OR;  Service: Orthopedics;  Laterality: Left;   APPENDECTOMY     BACK SURGERY     1995   CARDIAC CATHETERIZATION     2009   CARDIAC DEFIBRILLATOR PLACEMENT     ICD was removed in 2018   CHOLECYSTECTOMY     CHOLECYSTECTOMY, LAPAROSCOPIC     11/2007   COLONOSCOPY W/ POLYPECTOMY  2009   ELBOW SURGERY Left 06/2010   ESOPHAGEAL DILATION N/A 11/08/2014   Procedure: ESOPHAGEAL DILATION;  Surgeon: Claudis RAYMOND Rivet, MD;  Location: [facility] ORS;  Service: Endoscopy;  Laterality: N/A;  #56,    ESOPHAGOGASTRODUODENOSCOPY  03/31/2012   also 08/2011; [name]   ESOPHAGOGASTRODUODENOSCOPY (EGD) WITH PROPOFOL  N/A 11/08/2014   Procedure: ESOPHAGOGASTRODUODENOSCOPY (EGD) WITH PROPOFOL ;  Surgeon: Claudis RAYMOND Rivet, MD;  Location: [facility];  Service: Endoscopy;  Laterality: N/A;  Hiatus is 39 , GE Junction is 37   FOOT ARTHRODESIS Left 10/24/2020   Procedure: LEFT GASTROCNEMIUS RECESSION, DORSIFLEXION OSTEOTOMY 1ST MT;  Surgeon: Harden Jerona GAILS, MD;   Location: [facility] OR;  Service: Orthopedics;  Laterality: Left;   FOOT ARTHRODESIS Right 01/09/2021   Procedure: CLOSING WEDGE OSTEOTOMY RIGHT 1ST METATARSAL;  Surgeon: Harden Jerona GAILS, MD;  Location: [facility] OR;  Service: Orthopedics;  Laterality: Right;   GASTROCNEMIUS RECESSION Right 01/09/2021   Procedure: RIGHT GASTROCNEMIUS RECESSION;  Surgeon: Harden Jerona GAILS, MD;  Location: [facility] OR;  Service: Orthopedics;  Laterality: Right;   ICD LEAD REMOVAL N/A 10/07/2016   Procedure: ICD LEAD REMOVAL ;  Surgeon: Danelle LELON Birmingham, MD;  Location: [facility] OR;  Service: Cardiovascular;  Laterality: N/A;   MULTIPLE TOOTH EXTRACTIONS     RADIOFREQUENCY ABLATION  1999   WPW; performed at [facility]   STUMP REVISION Left 07/22/2023   Procedure: REVISION LEFT BELOW KNEE AMPUTATION;  Surgeon: Harden Jerona GAILS, MD;  Location: [facility] OR;  Service: Orthopedics;  Laterality: Left;   TEE WITHOUT CARDIOVERSION N/A 10/07/2016   Procedure: TRANSESOPHAGEAL ECHOCARDIOGRAM (TEE);  Surgeon: Danelle LELON Birmingham, MD;  Location: [facility] OR;  Service: Cardiovascular;  Laterality: N/A;   Patient Active Problem List   Diagnosis Date Noted   Dyslipidemia 11/03/2023   Essential hypertension 11/03/2023   Type 2 diabetes mellitus with peripheral neuropathy (HCC) 11/03/2023   BPH (benign prostatic hyperplasia) 11/03/2023   GERD without esophagitis 11/03/2023   S/P AKA (above knee amputation) unilateral, left (HCC) 08/19/2023   Dehiscence of amputation stump of left lower extremity (HCC) 07/22/2023   Ischemia of site of left below knee amputation (HCC) 07/22/2023   S/P BKA (below knee amputation) unilateral, left (HCC) 07/22/2023   Below-knee amputation of left lower extremity (HCC) 06/17/2023   Acute osteomyelitis of metatarsal bone of left foot (HCC)    Subacute osteomyelitis, left ankle and foot (HCC) 05/13/2022   Non-pressure chronic ulcer of other part of right foot limited to breakdown of skin (HCC)    Contracture of right Achilles tendon    Mildly  dilatd aortic root (HCC)    Contracture of left Achilles tendon    Metatarsal deformity, left    CAD (coronary artery disease)    Hallux hammertoe, left 12/11/2019   Diarrhea 06/27/2019   Diastasis recti 08/22/2018   Umbilical hernia without obstruction and without gangrene 08/22/2018   Vitamin D  deficiency 06/23/2017   Essential hypertension, benign 06/15/2017   Mixed hyperlipidemia 06/15/2017   Class 1 obesity due to excess calories with serious comorbidity and body mass index (BMI) of 31.0 to 31.9 in adult 06/15/2017   Malfunction of implantable cardioverter-defibrillator (ICD) electrode 10/07/2016  Chronic kidney disease (CKD), stage III (moderate) (HCC) 04/04/2015   Bladder neck obstruction 03/21/2015   Difficult or painful urination 03/21/2015   Flank pain 03/21/2015   Delayed onset of urination 03/21/2015   Calculus of kidney 03/21/2015   Erosive esophagitis 12/27/2013   Hypotension due to drugs 07/02/2013   Chronic systolic heart failure (HCC) 04/24/2013   CAP (community acquired pneumonia) 03/16/2013   HTN (hypertension) 03/15/2013   Hydrocele 03/15/2013   Spermatocele 03/15/2013   DOE (dyspnea on exertion) 03/15/2013   OSA (obstructive sleep apnea) 01/09/2013   Chronic kidney disease, stage 3 (HCC) 01/03/2013   Chest pain 12/26/2012   History of diagnostic tests 09/06/2012   TIA (transient ischemic attack)    Cardiomyopathy, nonischemic (HCC) 02/24/2010   AICD (automatic cardioverter/defibrillator) 02/24/2010   DM type 2 causing vascular disease (HCC) 06/27/2009   Gout 06/27/2009   Tobacco abuse, in remission 06/27/2009   COLONIC POLYPS 10/31/2008   GERD (gastroesophageal reflux disease) 10/31/2008    PCP: Gladis Lauraine BRAVO, NP  REFERRING PROVIDER: Harden Jerona GAILS, MD  REFERRING DIAG: [phone] (ICD-10-CM) - Hx of AKA (above knee amputation), left (HCC)   THERAPY DIAG:  Other abnormalities of gait and mobility  Muscle weakness (generalized)  Stiffness of left  hip, not elsewhere classified  Unsteadiness on feet  Rationale for Evaluation and Treatment: Rehabilitation  ONSET DATE: Prosthesis delivery 02/20/2024  SUBJECTIVE:   SUBJECTIVE STATEMENT:   Patient reports that he is feeling better than he has been. Patient reports that he is having pain in residual limb where the suction ring is and it has been constant pain. He does endorse that pain will go away about an hour after removing prosthesis.  Patient reports that insurance runs out and June 28, 2024 will be his last day.   PERTINENT HISTORY: DM2, GERD, hypertension, dyslipidemia, CAD, L BKA (06/17/23), neuropathy  Eval: Patient is a 63 year old male who presents to physical therapy with a left AKA (08/19/2023) with a new prosthesis delivery on 02/20/2024. Patient had previous home health PT prior to Lt AKA. He is able to mobilize with modified independence w/ walker. Wears prosthesis about ten hours a day and by the end of the night he takes it off due to fatigue and pain.   PAIN:  Are you having pain? Yes: NPRS scale: 4.5/10 today, 10/10 at worst over last few days  Pain location: Femur in prosthesis and pain in the Rt leg as well Pain description: feels like its been hammered Aggravating factors: keeping prosthesis on for long periods of times, sitting Relieving factors: taking off prosthesis and medications  PRECAUTIONS: Fall  RED FLAGS: None   WEIGHT BEARING RESTRICTIONS: No  FALLS:  Has patient fallen in last 6 months? Yes. Number of falls 2, forgetting he doesn't have a leg and put weight onto the left side. He did not hit the floor, only fell into another person. Occurred when transferring from sitting to standing  LIVING ENVIRONMENT: Lives with: lives with their spouse Lives in: Other Trailer on grass Stairs: Yes: External: 4 steps; can reach both Has following equipment at home: Single point cane, Environmental Consultant - 2 wheeled, Wheelchair (manual), and shower chair  OCCUPATION:  retired   PLOF: Independent with household mobility with device  PATIENT GOALS: ambulating and ADLs with cane   NEXT MD VISIT: Office MD visit: 03/21/2024   OBJECTIVE:  Patient-Specific Activity Scoring Scheme  0 represents "unable to perform." 10 represents "able to perform at prior level. 0 1 2  3 4 5 6 7 8 9 10  (Date and Score)   Activity Eval     1. Walking w/ cane  2    2. Standing for ADLs  5    3. Stairs 5   4.    5.    Score 4    Total score = sum of the activity scores/number of activities Minimum detectable change (90%CI) for average score = 2 points Minimum detectable change (90%CI) for single activity score = 3 points  DIAGNOSTIC FINDINGS: none post-amputation  COGNITION: Overall cognitive status: Within functional limits for tasks assessed   SENSATION: WFL  MUSCLE LENGTH:   POSTURE: rounded shoulders, forward head, flexed trunk , and weight shift right  LOWER EXTREMITY ROM:  ROM P:passive  A:active Left eval 06/14/2024  Hip flexion    Hip extension Thomas position: P: - 40* P: -27  Hip abduction    Hip adduction    Hip internal rotation    Hip external rotation    Knee flexion    Knee extension    Ankle dorsiflexion    Ankle plantarflexion    Ankle inversion    Ankle eversion     (Blank rows = not tested)  LOWER EXTREMITY MMT:  MMT Right eval Left eval  Hip flexion 3/5 3/5  Hip extension 3/5 3-/5  Hip abduction  3-/5  Hip adduction    Hip internal rotation    Hip external rotation    Knee flexion    Knee extension 3/5   Ankle dorsiflexion    Ankle plantarflexion    Ankle inversion    Ankle eversion    At Evaluation all strength testing is grossly seated and functionally standing / gait. (Blank rows = not tested)  BED MOBILITY:  Sit to supine Complete Independence Supine to sit Complete Independence Rolling to Right Complete Independence  TRANSFERS: 04/10/2024: Pt able stand to/from sit into chair without armrest using  BUEs on seat and RW for stabilization with supervision/verbal cues.  Eval: Sit to stand: supervision requires a stable object to arise, stabilization w/ walker using UE support, with delayed weight shift onto the prosthesis for stabilization, and uses UE to bring Lt prosthesis back below his body  Stand to sit: supervision requires a stable object to arise, stabilization w/ walker using UE support, with delayed weight shift onto the prosthesis for stabilization  FUNCTIONAL TESTs:  04/10/2024: Patient able to reach 10 forward and look over his shoulders with single upper extremity support on RW.   Eval:  BERG score 11/56 05/31/2024: performed with standing rest breaks at RW, but performed below tasks without RW   [facility] PT Assessment - 05/31/24 0001       Berg Balance Test   Sit to Stand Able to stand using hands after several tries    Standing Unsupported Able to stand 2 minutes with supervision    Sitting with Back Unsupported but Feet Supported on Floor or Stool Able to sit safely and securely 2 minutes    Stand to Sit Uses backs of legs against chair to control descent    Transfers Able to transfer safely, definite need of hands    Standing Unsupported with Eyes Closed Able to stand 10 seconds with supervision    Standing Unsupported with Feet Together Able to place feet together independently and stand for 1 minute with supervision    From Standing, Reach Forward with Outstretched Arm Reaches forward but needs supervision    From Standing Position, Pick  up Object from Floor Able to pick up shoe, needs supervision    From Standing Position, Turn to Look Behind Over each Shoulder Needs supervision when turning    Turn 360 Degrees Needs assistance while turning    Standing Unsupported, Alternately Place Feet on Step/Stool Needs assistance to keep from falling or unable to try    Standing Unsupported, One Foot in Front Able to plae foot ahead of the other independently and hold 30 seconds     Standing on One Leg Tries to lift leg/unable to hold 3 seconds but remains standing independently    Total Score 29    Berg comment: BERG < 36 high risk for falls (close to 100%) 46-51 moderate (>50%) 37-45 significant (>80%) 52-55 lower (> 25%)         BERG: 06/14/2024 31/56 performed with RW in front of patient and intermittently using RW during breaks   GAIT: 04/10/2024: Patient ambulated 125' with RW and supervision.   Patient negotiated 12*ramp and 6.5 curb with RW and supervision  Eval: Gait pattern: step to pattern, decreased step length- Right, decreased stance time- Left, decreased hip/knee flexion- Left, circumduction- Left, Left hip hike, antalgic, trendelenburg, lateral lean- Right, trunk flexed, abducted- Left, and poor foot clearance- Left Distance walked: 62 ft Assistive device utilized: Walker - 2 wheeled and AKA prosthesis Level of assistance: SBA / steady with RW support but needs cues  Gait velocity: 0.72 ft/sec Comments: excessive weightbearing on BUEs using RW   05/31/2024 Gait Speed:  Self-selected: 1.59 ft/sec Fast-as-comfortable: 2.29 ft/sec    RAMP : not tested  CURB: not tested  CURRENT PROSTHETIC WEAR ASSESSMENT:   Patient is dependent with: skin check, residual limb care, care of non-amputated limb, prosthetic cleaning, ply sock cleaning, correct ply sock adjustment, proper wear schedule/adjustment, and proper weight-bearing schedule/adjustment Donning prosthesis: SBA / verbal cues Doffing prosthesis: Complete Independence Prosthetic wear tolerance: 6-8 hours, 1x/day, 7 days/week Prosthetic weight bearing tolerance: tolerated standing for 10 minutes with partial weight on prosthesis during BERG balance test with no increased pain. Although he reports standing pain with activities at home. Edema: minimal Residual limb condition: 3x42mm superficial wound distally located, scar with mild adherence, no adductor roll, normal skin color, decreased hair  growth present Prosthetic description: silicone liner with suction ring suspension, ischial containment socket with flexible inner socket, rotator unit, SAFETY (single axis friction engaging with an extension assist), flexible keel foot  TODAY'S TREATMENT:                                                                                                                    DATE: 06/14/2024 Prosthetic training with transfemoral prosthesis: Patient independently doffed prosthesis and silicone liner in order for PT to perform skin check around incision site as well as anterior thigh/femur. PT discussed redness on anterior thigh and palpated leading to increased pain/soreness with visible reaction from patient. PT educated on importance of donning silicone liner appropriately as well as taking valve out while donning in order  to seat the residual limb in the prosthesis without leaving an air bubble. Patient performed donning with verbal cues only. PT then observed the [facility] stretch in order to note any improvements in hip flexor contracture (with results noted above). PT then observed patient perform sit to stand, ambulation for ~30', then stand to sit while discussing importance of scheduling a follow up with prosthetist.   Physical Performance:  Patient performed BERG balance assessment with results being 31/56 and discussed with patient on improvements. However, PT also discussed with patient that he is still a high fall risk for higher level balance tasks without the RW.    TODAY'S TREATMENT:                                                                                                                    DATE: 05/31/2024 Prosthetic training with transfemoral prosthesis: PT discussed importance of doffing prosthesis and silicone liner for at least 6 hours at a time to let residual limb breathe/dry (whether that be overnight/etc). PT also discussed that patient will be receiving a new silicone liner  that is covered under warranty secondary to first one ripping/tearing. Patient and spouse acknowledge.  PT discussed continuation of PT services in order to improve balance, independence, strength, and safety. Patient and spouse acknowledge and agree.  Patient performed ramp and curb with RW with SUP-MOD I. PT discussed with patient that only step of descending ramp to improve upon would be hand placement upon the RW onto the rear portion of handles to decrease unsteadiness and chances of falling if he leaned forward. Patient acknowledged and agreed.  Patient ambulated with RW for 300' with SUP-mod I with continued, but improved, gait deviations.   Physical Performance: Patient performed the BERG balance assessment with SBA-SUP with intermittent standing rest breaks at RW. PT discussed results with patient and wife as patient has made significant improvements, though remains in high fall risk category. Patient and spouse acknowledged and agreed.  Patient performed gait speed with RW independently with results noted above. Results also discussed with patient for comprehension of improvements and why this is assessed. Patient and spouse acknowledged.   TODAY'S TREATMENT:                                                                                                                    DATE: 05/29/2024 Prosthetic Training with Transfemoral Prosthesis Patient continues to have right anterior tibial pain.  PT spoke with daughter due to the end of the session agrees  with PT that is probably overuse stress related pain.  Dr. Harden did note some calcification to the interosseous ligament.   PT recommended increasing wear of prosthesis from arising in the morning until going to bed in the evening.  He can switch and clean liner with bathing.  PT discussed fall risk when prosthesis is off and stress to his right lower extremity.  Patient reports that he currently only has 1 prosthetic liner.  PT spoke with  prosthetist who is working with the company to replace the second 1 that tore and was still under the warranty.  PT also recommended discussing getting a new insert or support supportive shoe for his right foot as his current shoe can also be a part of the pain problem he is having.  Patient wife verbalized understanding of above. PT demo and verbal cues on how to transfer standing to the floor and back to upright using BUEs on chair seat.  If he falls he needs to check first that he did not break anything and if so call 911.  Secondly he needs to check that the prosthetic socket did not rotate on his limb and if so correct it prior to trying to stand.  Patient was able to demonstrate transfer to the floor and back to standing via half kneeling using the chair with bilateral upper extremity support with cues and contact-guard assistance for safety.   TREATMENT:                                                                                                                    DATE: 05/24/2024 Prosthetic Training with Transfemoral Prosthesis PT discussed pain in Rt LE, potential causes, and then performed a skin check. PT noting several scabs throughout LE, that has also been noted and cleared by primary care MD, dry skin, and is highly painful to palpation and ankle ROM (DF and PF). PT discussed importance of following up with MD in order to improve pain levels as it is currently impacting PT, ambulation, and personal care. Patient also notes that the pain is similar to pain in Lt LE prior to amputation and PT endorsed seeing MD. Patient and spouse acknowledged and agreed.  Patient was scheduled for same day appointment with Maurilio Collet, PA-C in Dr. Crist office in order to clear patient.  PT discussed importance of posture then had patient perform seated rows with red TB. Patient requiring tactile and verbal cues for appropriate form with minimal carryover. Activity was discontinued secondary to above noted  sam-day appointment.    TREATMENT:  DATE: 05/22/2024 Prosthetic Training with Transfemoral Prosthesis PT discussed prosthesis wear time with patient as patient endorses intermittent difficulty secondary to sensation of tightness and feeling like it is going to explode. Patient notes ability to wear for 6-7 hours 2x/day on some days, though on difficult days he can only don for 2-3 hours at a time. Patient endorses sensation of ice cold and pale skin on residual limb when doffing prosthesis and liner that can last up to 40-45 minutes and will improve when covering it up with a blanket to rewarm.  Patient ambulated 1x40' with RW and SBA-SUP from PT for reassessment following 3-week break from PT. Patient performed with increased Lt abduction, intermittent Lt toe drag during swing phase, and Lt femoral ER throughout.  Patient then performed 1x40' with SPC with quad tip and CGA from PT. Patient did not have any moments of instability with forward ambulation, though continued to have deviations that were noted with RW.  PT discussed ladder safety and appropriate form when ascending and descending ladder with patient and spouse. Patient and spouse acknowledged and agreed.  PT discussed follow up with MD in order to address high levels of pain in intact limb as patient endorses sensations similar to Lt LE when it had the infection that began the amputation process. Patient and spouse acknowledged and agreed.   Neuro Re-Ed:  PT discussed balance changes that occur with a prosthesis and the decrease of proprioceptive feedback from LE musculature. PT educated on how patient will continue to receive feedback from residual limb, however, it takes time for body to learn from residual limb and it is a different sort of input compared to typical. Patient acknowledged. Patient performed standing punch  with blue TB to challenge standing balance with equal weightbearing with CGA from PT throughout. Patient endorsed ease of performance with blue TB which then PT discontinued. Patient then performed reactive balance while walking fwd/backward with bilat handles on cable machine each at 10# each and CGA-max A throughout. Max A required one time with Rt LOB during backward ambulation toward machine. PT discontinued following third attempt.       PATIENT EDUCATION: PATIENT EDUCATED ON FOLLOWING PROSTHETIC CARE: Education details: Sitting positioning to limit skin traction from weight of prosthesis, how hip flexion contracture effects residual limb pain & posture,  Prosthetic cleaning and Propper donning  Person educated: Patient, Spouse, and close family friend Education method: Explanation, Demonstration, Tactile cues, and Verbal cues Education comprehension: verbalized understanding and needs further education  HOME EXERCISE PROGRAM: Access Code: [email or web address] URL: [email or web address] Date: 03/20/2024 Prepared by: Grayce Spatz  Exercises - Modified Thomas Stretch  - 2 x daily - 7 x weekly - 1 sets - 2-3 reps - 30 seconds hold - Hip Flexor Stretch at Edge of Bed  - 2 x daily - 7 x weekly - 1 sets - 2-3 reps - 30 seconds hold - Standing posture with back to counter  - 2 x daily - 7 x weekly - 1 sets - 1 reps - 5-10 minutes hold - Upright Stance at Door Frame Single Arm  - 3-6 x daily - 7 x weekly - 1 sets - 2 reps - 2 deep breathes hold - Upright Stance at Door Frame with Both Arms  - 3-6 x daily - 7 x weekly - 1 sets - 2 reps - 2 deep breathes hold  Do each exercise 1-2  times per day Do each exercise 5-10 repetitions Hold each exercise for 2 seconds to  feel your location  AT [facility] FIND YOUR MIDLINE POSITION AND PLACE FEET EQUAL DISTANCE FROM THE MIDLINE.  Try to find this position when standing still for activities.   USE TAPE ON FLOOR TO MARK THE MIDLINE POSITION which is  even with middle of sink.  You also should try to feel with your limb pressure in socket.  You are trying to feel with limb what you used to feel with the bottom of your foot.  Side to Side Shift: Moving your hips only (not shoulders): move weight onto your left leg, HOLD/FEEL pressure in socket.  Move back to equal weight on each leg, HOLD/FEEL pressure in socket. Move weight onto your right leg, HOLD/FEEL pressure in socket. Move back to equal weight on each leg, HOLD/FEEL pressure in socket. Repeat.  Start with both hands on sink, progress to hand on prosthetic side only, then no hands.  Front to Back Shift: Moving your hips only (not shoulders): move your weight forward onto your toes, HOLD/FEEL pressure in socket. Move your weight back to equal Flat Foot on both legs, HOLD/FEEL  pressure in socket. Move your weight back onto your heels, HOLD/FEEL  pressure in socket. Move your weight back to equal on both legs, HOLD/FEEL  pressure in socket. Repeat.  Start with both hands on sink, progress to hand on prosthetic side only, then no hands.  Moving Cones / Cups: With equal weight on each leg: Hold on with one hand the first time, then progress to no hand supports. Move cups from one side of sink to the other. Place cups ~2" out of your reach, progress to 10" beyond reach.  Place one hand in middle of sink and reach with other hand. Do both arms.  Then hover one hand and move cups with other hand.  Overhead/Upward Reaching: alternated reaching up to top cabinets or ceiling if no cabinets present. Keep equal weight on each leg. Start with one hand support on counter while other hand reaches and progress to no hand support with reaching.  ace one hand in middle of sink and reach with other hand. Do both arms.  Then hover one hand and move cups with other hand.  5.   Looking Over Shoulders: With equal weight on each leg: alternate turning to look over your shoulders with one hand support on counter as needed.   Start with head motions only to look in front of shoulder, then even with shoulder and progress to looking behind you. To look to side, move head /eyes, then shoulder on side looking pulls back, shift more weight to side looking and pull hip back. Place one hand in middle of sink and let go with other hand so your shoulder can pull back. Switch hands to look other way.   Then hover one hand and look over shoulder. If looking right, use left hand at sink. If looking left, use right hand at sink. 6.  Stepping with leg that is not amputated:  Move items under cabinet out of your way. Shift your hips/pelvis so weight on prosthesis. Tighten muscles in hip on prosthetic side.  SLOWLY step other leg so front of foot is in cabinet. Then step back to floor.    ASSESSMENT:  CLINICAL IMPRESSION:    Patient arrived to session noting hammering pain in residual limb on anterior side where silicone ring is on the liner. PT observed skin with slight redness noted, though patient highly TTP. Patient is slowly making improvements with hip  flexor contracture and balance, however, patient is still a high fall risk. Patient will continue to benefit from skilled PT.   OBJECTIVE IMPAIRMENTS: Abnormal gait, decreased activity tolerance, decreased balance, decreased endurance, decreased knowledge of condition, decreased knowledge of use of DME, decreased mobility, difficulty walking, decreased ROM, decreased strength, impaired flexibility, postural dysfunction, prosthetic dependency , and pain.   ACTIVITY LIMITATIONS: carrying, lifting, bending, sitting, standing, squatting, stairs, transfers, bathing, toileting, dressing, reach over head, and locomotion level  PARTICIPATION LIMITATIONS: meal prep, cleaning, laundry, driving, shopping, community activity, and yard work  PERSONAL FACTORS: Age, Fitness, Past/current experiences, Time since onset of injury/illness/exacerbation, and 3+ comorbidities: see PMH are also affecting  patient's functional outcome.   REHAB POTENTIAL: Good  CLINICAL DECISION MAKING: Evolving/moderate complexity  EVALUATION COMPLEXITY: Moderate   GOALS: Goals reviewed with patient? Yes  SHORT TERM GOALS: Target date: 04/05/2024  Patient donnes prosthesis modified independent & verbalizes proper cleaning. Baseline: SEE OBJECTIVE DATA Goal status: MET, 04/10/2024  2.  Patient tolerates prosthesis >12 hrs total /day without skin issues or limb pain >7/10 after standing. Baseline: patient reports wearing 4-5 hr/day Goal status: GOAL MET, 70/23/2025  3.  Patient able to reach 7 and look over both shoulders with RW support with Modified independent. Baseline: SEE OBJECTIVE DATA Goal status: MET, 04/10/2024  4. Patient ambulates 125' with RW & prosthesis with supervision. Baseline: SEE OBJECTIVE DATA Goal status: MET, 04/10/2024  5. Patient negotiates ramps & curbs with RW & prosthesis with supervision. Baseline: SEE OBJECTIVE DATA Goal status: MET, 04/10/2024  LONG TERM GOALS: Target date: 08/23/2024  Patient demonstrates & verbalized understanding of prosthetic care to enable safe utilization of prosthesis. Baseline: SEE OBJECTIVE DATA Goal status: MET 05/29/2024  Patient tolerates prosthesis wear >90% of awake hours without skin issues or limb pain >4/10. Baseline: SEE OBJECTIVE DATA Goal status: Ongoing    05/31/2024  BERG >/= 25/56 to indicate lower fall risk Baseline: SEE OBJECTIVE DATA Goal status: GOAL MET, 05/31/2024  Patient ambulates >300' with prosthesis and LRAD independently Baseline: SEE OBJECTIVE DATA Goal status: Ongoing   05/31/2024 ; able to complete with RW   Patient negotiates ramps, curbs & stairs with single rail with prosthesis and LRAD independently. Baseline: SEE OBJECTIVE DATA Goal status: Ongoing    05/31/2024  6.  Patient reports Patient-Specific Activity Score improved the average by 3 to indicate improvement in functional activities.   Baseline:  SEE OBJECTIVE DATA Goal status: Ongoing  05/31/2024  PLAN:  PT FREQUENCY: 1-2x/week  PT DURATION: 12 weeks , 2x/wk for at least 6 weeks   PLANNED INTERVENTIONS: 97164- PT Re-evaluation, 97750- Physical Performance Testing, 97110-Therapeutic exercises, 97530- Therapeutic activity, 97112- Neuromuscular re-education, 97535- Self Care, [id number]- Manual therapy, [phone]- Gait training, [phone]- Prosthetic Initial , [phone]- Orthotic/Prosthetic subsequent, Patient/Family education, Balance training, Stair training, Joint mobilization, and DME instructions  PLAN FOR NEXT SESSION:  balance, posture, gait with cane, gait with speed changes, PSFS   Susannah Daring, PT, DPT 06/14/24 4:09 PM     Date of referral: 02/13/2024  Referring provider: Harden Jerona GAILS, MD Referring diagnosis? S10.387 (ICD-10-CM) - Hx of AKA (above knee amputation), left (HCC)  Treatment diagnosis? (if different than referring diagnosis) R26.89, M62.81, M79.605, M25.652, R29.3, R26.81, L98.498  What was this (referring dx) caused by? Other: Lt AKA  Nature of Condition: Chronic (continuous duration > 3 months)   Laterality: Lt  Current Functional Measure Score: Patient Specific Functional Scale Score:4  Objective measurements identify impairments when they are compared to normal  values, the uninvolved extremity, and prior level of function.  [x]  Yes  []  No  Objective assessment of functional ability: Moderate functional limitations   Briefly describe symptoms: Patient is dependent in prosthetic care and use with high risk of falls. Gait deviation, muscle weakness, pain in Lt residual limb, hip flexion contracture, heavy reliance on AD  How did symptoms start: amputation due to vascular issues  Average pain intensity:  Last 24 hours: 3-4/10  Past week: 3-7/10  How often does the pt experience symptoms? Constantly  How much have the symptoms interfered with usual daily activities? Quite a bit  How has condition changed  since care began at this facility? A little better  In general, how is the patients overall health? Good   BACK PAIN (STarT Back Screening Tool) No

## 2024-06-14 ENCOUNTER — Ambulatory Visit

## 2024-06-14 DIAGNOSIS — M6281 Muscle weakness (generalized): Secondary | ICD-10-CM | POA: Diagnosis not present

## 2024-06-14 DIAGNOSIS — R2689 Other abnormalities of gait and mobility: Secondary | ICD-10-CM | POA: Diagnosis not present

## 2024-06-14 DIAGNOSIS — R2681 Unsteadiness on feet: Secondary | ICD-10-CM

## 2024-06-14 DIAGNOSIS — M25652 Stiffness of left hip, not elsewhere classified: Secondary | ICD-10-CM | POA: Diagnosis not present

## 2024-06-15 ENCOUNTER — Encounter: Payer: Self-pay | Admitting: Internal Medicine

## 2024-06-19 ENCOUNTER — Ambulatory Visit: Admitting: Physical Therapy

## 2024-06-19 ENCOUNTER — Encounter: Payer: Self-pay | Admitting: Physical Therapy

## 2024-06-19 DIAGNOSIS — M25652 Stiffness of left hip, not elsewhere classified: Secondary | ICD-10-CM

## 2024-06-19 DIAGNOSIS — M6281 Muscle weakness (generalized): Secondary | ICD-10-CM

## 2024-06-19 DIAGNOSIS — R2689 Other abnormalities of gait and mobility: Secondary | ICD-10-CM | POA: Diagnosis not present

## 2024-06-19 DIAGNOSIS — M79605 Pain in left leg: Secondary | ICD-10-CM

## 2024-06-19 DIAGNOSIS — R293 Abnormal posture: Secondary | ICD-10-CM

## 2024-06-19 NOTE — Therapy (Signed)
 OUTPATIENT PHYSICAL THERAPY PROSTHETIC TREATMENT    Patient Name: Mike Floyd MRN: [id number] DOB:04-29-1961, 63 y.o., male Today's Date: 06/19/2024  END OF SESSION:  PT End of Session - 06/19/24 1310     Visit Number 18    Number of Visits 40    Date for Recertification  08/23/24    Authorization Type [employer] MEDICARE    Authorization Time Period $45 copay 8 Therapy Visit(s) from 05/31/2024 to 06/28/2024    Authorization - Visit Number 3    Authorization - Number of Visits 8    Progress Note Due on Visit 20    PT Start Time 1304    PT Stop Time 1344    PT Time Calculation (min) 40 min    Equipment Utilized During Treatment Gait belt    Activity Tolerance Patient tolerated treatment well;Patient limited by fatigue    Behavior During Therapy WFL for tasks assessed/performed            Past Medical History:  Diagnosis Date   AICD (automatic cardioverter/defibrillator) present 11/2007   Removed in 2018, a. 11/2007 S[facility] Current VR - single lead ICD  - Removed 2018 - it was burning me   Anxiety    CAD (coronary artery disease)    non-obstructive CAD by Cor CT in 2020 // Myoview  3/22: EF 57, small inf-sept defect c/w scar, no ischemia; low risk      Chest pain    a. 10/2007 Cath:  normal Cors.   CKD (chronic kidney disease), stage II    DDD (degenerative disc disease), lumbar    Diabetes mellitus DX: 2010   type 2   Erosive esophagitis    a. per EGD (08/2011), Dr. Golda - Erosive reflux esophagitis improved but not completely healed since previous EGD 3 years ago. Bx showing  ulcerated gatroesophageal junction mucosa. negative for H. pylori   GERD (gastroesophageal reflux disease)    Gout    Hearing deficit    a. wear bilateral hearing aides   History of hiatal hernia    History of kidney stones    passed stones, no surgery   Hypertension    Mildly dilatd aortic root (HCC)    CMR 4/22: EF 52, no LGE; d/w Dr. Ione root 38 mm (mildly dilated)   Myocardial  infarction [facility]) 2011   Neuropathy    Feet and legs   Nonischemic dilated cardiomyopathy (HCC)    a. H/O EF as low as 35-40% by LV gram 10/2007;  b. Echo 02/2011 EF 50-55%, inf HK, Gr 1 DD // CMR 4/22: EF 52, no LGE; d/w Dr. Ione root 38 mm (mildly dilated)    Renal insufficiency    Sleep apnea    pt doesnt use, statesI cant afford one. PCP aware   Stroke (HCC)    mini-stroke in 2014   TIA (transient ischemic attack)    July, 2013   Tobacco abuse, in remission 06/27/2009   Discontinued in 2009     Wears dentures    top plate   WPW (Wolff-Parkinson-White syndrome)    a. s/p RFCA @ [facility] - 1999   Past Surgical History:  Procedure Laterality Date   AMPUTATION Left 05/19/2022   Procedure: TRANSMETATARSAL AMPUTATION LEFT FOOT;  Surgeon: Harden Jerona GAILS, MD;  Location: [facility] OR;  Service: Orthopedics;  Laterality: Left;   AMPUTATION Left 06/17/2023   Procedure: LEFT BELOW KNEE AMPUTATION;  Surgeon: Harden Jerona GAILS, MD;  Location: [facility] OR;  Service: Orthopedics;  Laterality: Left;  AMPUTATION Left 08/19/2023   Procedure: LEFT ABOVE KNEE AMPUTATION;  Surgeon: Harden Jerona GAILS, MD;  Location: [facility] OR;  Service: Orthopedics;  Laterality: Left;   APPENDECTOMY     BACK SURGERY     1995   CARDIAC CATHETERIZATION     2009   CARDIAC DEFIBRILLATOR PLACEMENT     ICD was removed in 2018   CHOLECYSTECTOMY     CHOLECYSTECTOMY, LAPAROSCOPIC     11/2007   COLONOSCOPY W/ POLYPECTOMY  2009   ELBOW SURGERY Left 06/2010   ESOPHAGEAL DILATION N/A 11/08/2014   Procedure: ESOPHAGEAL DILATION;  Surgeon: Claudis RAYMOND Rivet, MD;  Location: [facility] ORS;  Service: Endoscopy;  Laterality: N/A;  #56,    ESOPHAGOGASTRODUODENOSCOPY  03/31/2012   also 08/2011; [name]   ESOPHAGOGASTRODUODENOSCOPY (EGD) WITH PROPOFOL  N/A 11/08/2014   Procedure: ESOPHAGOGASTRODUODENOSCOPY (EGD) WITH PROPOFOL ;  Surgeon: Claudis RAYMOND Rivet, MD;  Location: [facility] ORS;  Service: Endoscopy;  Laterality: N/A;  Hiatus is 33 , GE Junction is 37   FOOT  ARTHRODESIS Left 10/24/2020   Procedure: LEFT GASTROCNEMIUS RECESSION, DORSIFLEXION OSTEOTOMY 1ST MT;  Surgeon: Harden Jerona GAILS, MD;  Location: [facility] OR;  Service: Orthopedics;  Laterality: Left;   FOOT ARTHRODESIS Right 01/09/2021   Procedure: CLOSING WEDGE OSTEOTOMY RIGHT 1ST METATARSAL;  Surgeon: Harden Jerona GAILS, MD;  Location: [facility] OR;  Service: Orthopedics;  Laterality: Right;   GASTROCNEMIUS RECESSION Right 01/09/2021   Procedure: RIGHT GASTROCNEMIUS RECESSION;  Surgeon: Harden Jerona GAILS, MD;  Location: [facility] OR;  Service: Orthopedics;  Laterality: Right;   ICD LEAD REMOVAL N/A 10/07/2016   Procedure: ICD LEAD REMOVAL ;  Surgeon: Danelle LELON Birmingham, MD;  Location: [facility] OR;  Service: Cardiovascular;  Laterality: N/A;   MULTIPLE TOOTH EXTRACTIONS     RADIOFREQUENCY ABLATION  1999   WPW; performed at [facility]   STUMP REVISION Left 07/22/2023   Procedure: REVISION LEFT BELOW KNEE AMPUTATION;  Surgeon: Harden Jerona GAILS, MD;  Location: [facility] OR;  Service: Orthopedics;  Laterality: Left;   TEE WITHOUT CARDIOVERSION N/A 10/07/2016   Procedure: TRANSESOPHAGEAL ECHOCARDIOGRAM (TEE);  Surgeon: Danelle LELON Birmingham, MD;  Location: [facility] OR;  Service: Cardiovascular;  Laterality: N/A;   Patient Active Problem List   Diagnosis Date Noted   Dyslipidemia 11/03/2023   Essential hypertension 11/03/2023   Type 2 diabetes mellitus with peripheral neuropathy (HCC) 11/03/2023   BPH (benign prostatic hyperplasia) 11/03/2023   GERD without esophagitis 11/03/2023   S/P AKA (above knee amputation) unilateral, left (HCC) 08/19/2023   Dehiscence of amputation stump of left lower extremity (HCC) 07/22/2023   Ischemia of site of left below knee amputation (HCC) 07/22/2023   S/P BKA (below knee amputation) unilateral, left (HCC) 07/22/2023   Below-knee amputation of left lower extremity (HCC) 06/17/2023   Acute osteomyelitis of metatarsal bone of left foot (HCC)    Subacute osteomyelitis, left ankle and foot (HCC) 05/13/2022   Non-pressure  chronic ulcer of other part of right foot limited to breakdown of skin (HCC)    Contracture of right Achilles tendon    Mildly dilatd aortic root (HCC)    Contracture of left Achilles tendon    Metatarsal deformity, left    CAD (coronary artery disease)    Hallux hammertoe, left 12/11/2019   Diarrhea 06/27/2019   Diastasis recti 08/22/2018   Umbilical hernia without obstruction and without gangrene 08/22/2018   Vitamin D  deficiency 06/23/2017   Essential hypertension, benign 06/15/2017   Mixed hyperlipidemia 06/15/2017   Class 1 obesity due to excess calories  with serious comorbidity and body mass index (BMI) of 31.0 to 31.9 in adult 06/15/2017   Malfunction of implantable cardioverter-defibrillator (ICD) electrode 10/07/2016   Chronic kidney disease (CKD), stage III (moderate) (HCC) 04/04/2015   Bladder neck obstruction 03/21/2015   Difficult or painful urination 03/21/2015   Flank pain 03/21/2015   Delayed onset of urination 03/21/2015   Calculus of kidney 03/21/2015   Erosive esophagitis 12/27/2013   Hypotension due to drugs 07/02/2013   Chronic systolic heart failure (HCC) 04/24/2013   CAP (community acquired pneumonia) 03/16/2013   HTN (hypertension) 03/15/2013   Hydrocele 03/15/2013   Spermatocele 03/15/2013   DOE (dyspnea on exertion) 03/15/2013   OSA (obstructive sleep apnea) 01/09/2013   Chronic kidney disease, stage 3 (HCC) 01/03/2013   Chest pain 12/26/2012   History of diagnostic tests 09/06/2012   TIA (transient ischemic attack)    Cardiomyopathy, nonischemic (HCC) 02/24/2010   AICD (automatic cardioverter/defibrillator) 02/24/2010   DM type 2 causing vascular disease (HCC) 06/27/2009   Gout 06/27/2009   Tobacco abuse, in remission 06/27/2009   COLONIC POLYPS 10/31/2008   GERD (gastroesophageal reflux disease) 10/31/2008    PCP: Gladis Lauraine BRAVO, NP  REFERRING PROVIDER: Harden Jerona GAILS, MD  REFERRING DIAG: [id number] (ICD-10-CM) - Hx of AKA (above knee  amputation), left (HCC)   THERAPY DIAG:  Other abnormalities of gait and mobility  Muscle weakness (generalized)  Stiffness of left hip, not elsewhere classified  Pain in left leg  Abnormal posture  Rationale for Evaluation and Treatment: Rehabilitation  ONSET DATE: Prosthesis delivery 02/20/2024  SUBJECTIVE:   SUBJECTIVE STATEMENT:   He is seeing prosthetist tomorrow and getting new liner.    PERTINENT HISTORY: DM2, GERD, hypertension, dyslipidemia, CAD, L BKA (06/17/23), neuropathy  Eval: Patient is a 63 year old male who presents to physical therapy with a left AKA (08/19/2023) with a new prosthesis delivery on 02/20/2024. Patient had previous home health PT prior to Lt AKA. He is able to mobilize with modified independence w/ walker. Wears prosthesis about ten hours a day and by the end of the night he takes it off due to fatigue and pain.   PAIN:  Are you having pain? Yes: NPRS scale:  3-4/10 today, 10/10 at worst since last PT Pain location: Femur in prosthesis and pain in the Rt leg as well Pain description: feels like its been hammered Aggravating factors: keeping prosthesis on for long periods of times, sitting Relieving factors: taking off prosthesis and medications  PRECAUTIONS: Fall  RED FLAGS: None   WEIGHT BEARING RESTRICTIONS: No  FALLS:  Has patient fallen in last 6 months? Yes. Number of falls 2, forgetting he doesn't have a leg and put weight onto the left side. He did not hit the floor, only fell into another person. Occurred when transferring from sitting to standing  LIVING ENVIRONMENT: Lives with: lives with their spouse Lives in: Other Trailer on grass Stairs: Yes: External: 4 steps; can reach both Has following equipment at home: Single point cane, Environmental Consultant - 2 wheeled, Wheelchair (manual), and shower chair  OCCUPATION: retired   PLOF: Independent with household mobility with device  PATIENT GOALS: ambulating and ADLs with cane   NEXT MD  VISIT: Office MD visit: 03/21/2024   OBJECTIVE:  Patient-Specific Activity Scoring Scheme  0 represents "unable to perform." 10 represents "able to perform at prior level. 0 1 2 3 4 5 6 7 8 9  10 (Date and Score)   Activity Eval  06/19/24   1. Walking w/ cane  2 3   2. Standing for ADLs  5  7  3. Stairs 5 7  4.    5.    Score 4 5.66   Total score = sum of the activity scores/number of activities Minimum detectable change (90%CI) for average score = 2 points Minimum detectable change (90%CI) for single activity score = 3 points  DIAGNOSTIC FINDINGS: none post-amputation  COGNITION: Overall cognitive status: Within functional limits for tasks assessed   SENSATION: WFL  MUSCLE LENGTH:   POSTURE: rounded shoulders, forward head, flexed trunk , and weight shift right  LOWER EXTREMITY ROM:  ROM P:passive  A:active Left eval 06/14/2024  Hip flexion    Hip extension Thomas position: P: - 40* P: -27  Hip abduction    Hip adduction    Hip internal rotation    Hip external rotation    Knee flexion    Knee extension    Ankle dorsiflexion    Ankle plantarflexion    Ankle inversion    Ankle eversion     (Blank rows = not tested)  LOWER EXTREMITY MMT:  MMT Right eval Left eval  Hip flexion 3/5 3/5  Hip extension 3/5 3-/5  Hip abduction  3-/5  Hip adduction    Hip internal rotation    Hip external rotation    Knee flexion    Knee extension 3/5   Ankle dorsiflexion    Ankle plantarflexion    Ankle inversion    Ankle eversion    At Evaluation all strength testing is grossly seated and functionally standing / gait. (Blank rows = not tested)  BED MOBILITY:  Sit to supine Complete Independence Supine to sit Complete Independence Rolling to Right Complete Independence  TRANSFERS: 04/10/2024: Pt able stand to/from sit into chair without armrest using BUEs on seat and RW for stabilization with supervision/verbal cues.  Eval: Sit to stand: supervision  requires a stable object to arise, stabilization w/ walker using UE support, with delayed weight shift onto the prosthesis for stabilization, and uses UE to bring Lt prosthesis back below his body  Stand to sit: supervision requires a stable object to arise, stabilization w/ walker using UE support, with delayed weight shift onto the prosthesis for stabilization  FUNCTIONAL TESTs:  04/10/2024: Patient able to reach 10 forward and look over his shoulders with single upper extremity support on RW.   Eval:  BERG score 11/56 05/31/2024: performed with standing rest breaks at RW, but performed below tasks without RW   [facility] PT Assessment - 05/31/24 0001       Berg Balance Test   Sit to Stand Able to stand using hands after several tries    Standing Unsupported Able to stand 2 minutes with supervision    Sitting with Back Unsupported but Feet Supported on Floor or Stool Able to sit safely and securely 2 minutes    Stand to Sit Uses backs of legs against chair to control descent    Transfers Able to transfer safely, definite need of hands    Standing Unsupported with Eyes Closed Able to stand 10 seconds with supervision    Standing Unsupported with Feet Together Able to place feet together independently and stand for 1 minute with supervision    From Standing, Reach Forward with Outstretched Arm Reaches forward but needs supervision    From Standing Position, Pick up Object from Floor Able to pick up shoe, needs supervision    From Standing Position, Turn to Look Behind Over each Shoulder  Needs supervision when turning    Turn 360 Degrees Needs assistance while turning    Standing Unsupported, Alternately Place Feet on Step/Stool Needs assistance to keep from falling or unable to try    Standing Unsupported, One Foot in Front Able to plae foot ahead of the other independently and hold 30 seconds    Standing on One Leg Tries to lift leg/unable to hold 3 seconds but remains standing independently     Total Score 29    Berg comment: BERG < 36 high risk for falls (close to 100%) 46-51 moderate (>50%) 37-45 significant (>80%) 52-55 lower (> 25%)         BERG: 06/14/2024 31/56 performed with RW in front of patient and intermittently using RW during breaks   GAIT: 04/10/2024: Patient ambulated 125' with RW and supervision.   Patient negotiated 12*ramp and 6.5 curb with RW and supervision  Eval: Gait pattern: step to pattern, decreased step length- Right, decreased stance time- Left, decreased hip/knee flexion- Left, circumduction- Left, Left hip hike, antalgic, trendelenburg, lateral lean- Right, trunk flexed, abducted- Left, and poor foot clearance- Left Distance walked: 62 ft Assistive device utilized: Walker - 2 wheeled and AKA prosthesis Level of assistance: SBA / steady with RW support but needs cues  Gait velocity: 0.72 ft/sec Comments: excessive weightbearing on BUEs using RW   05/31/2024 Gait Speed:  Self-selected: 1.59 ft/sec Fast-as-comfortable: 2.29 ft/sec    RAMP : not tested  CURB: not tested  CURRENT PROSTHETIC WEAR ASSESSMENT:   Patient is dependent with: skin check, residual limb care, care of non-amputated limb, prosthetic cleaning, ply sock cleaning, correct ply sock adjustment, proper wear schedule/adjustment, and proper weight-bearing schedule/adjustment Donning prosthesis: SBA / verbal cues Doffing prosthesis: Complete Independence Prosthetic wear tolerance: 6-8 hours, 1x/day, 7 days/week Prosthetic weight bearing tolerance: tolerated standing for 10 minutes with partial weight on prosthesis during BERG balance test with no increased pain. Although he reports standing pain with activities at home. Edema: minimal Residual limb condition: 3x98mm superficial wound distally located, scar with mild adherence, no adductor roll, normal skin color, decreased hair growth present Prosthetic description: silicone liner with suction ring suspension, ischial containment  socket with flexible inner socket, rotator unit, SAFETY (single axis friction engaging with an extension assist), flexible keel foot  TODAY'S TREATMENT:                                                                                                                    DATE: 06/19/2024 Prosthetic training with transfemoral prosthesis: PT reviewed recommendations for prosthetist at appointment tomorrow to perform a dynamic alignment and modify the socket to address pain issues.  Patient verbalized understanding. Patient ambulated 50' x 2 with cane stand-alone tip with min/CGA.  PT cues on upright posture, step length and weight shift over prosthesis in stance. PT demo and verbal cues on negotiating curb with a cane and prosthesis.  Patient negotiated 6.5 curb 2 reps with min to modA.    Neuromuscular reeducation working on standing  balance Standing balance with upper extremity resistance using green Thera-Band to facilitate core stabilization and weightbearing through both lower extremities.  PT min/CGA with tactile cues and verbal cues for upright posture and balance reactions.  Single upper extremity alternating sides 5 reps each and bilateral upper extremities 10 reps: Row, forward reach and biceps curl/upward reach.   TREATMENT:                                                                                                                    DATE: 06/14/2024 Prosthetic training with transfemoral prosthesis: Patient independently doffed prosthesis and silicone liner in order for PT to perform skin check around incision site as well as anterior thigh/femur. PT discussed redness on anterior thigh and palpated leading to increased pain/soreness with visible reaction from patient. PT educated on importance of donning silicone liner appropriately as well as taking valve out while donning in order to seat the residual limb in the prosthesis without leaving an air bubble. Patient performed donning with verbal  cues only. PT then observed the [facility] stretch in order to note any improvements in hip flexor contracture (with results noted above). PT then observed patient perform sit to stand, ambulation for ~30', then stand to sit while discussing importance of scheduling a follow up with prosthetist.   Physical Performance:  Patient performed BERG balance assessment with results being 31/56 and discussed with patient on improvements. However, PT also discussed with patient that he is still a high fall risk for higher level balance tasks without the RW.    TREATMENT:                                                                                                                    DATE: 05/31/2024 Prosthetic training with transfemoral prosthesis: PT discussed importance of doffing prosthesis and silicone liner for at least 6 hours at a time to let residual limb breathe/dry (whether that be overnight/etc). PT also discussed that patient will be receiving a new silicone liner that is covered under warranty secondary to first one ripping/tearing. Patient and spouse acknowledge.  PT discussed continuation of PT services in order to improve balance, independence, strength, and safety. Patient and spouse acknowledge and agree.  Patient performed ramp and curb with RW with SUP-MOD I. PT discussed with patient that only step of descending ramp to improve upon would be hand placement upon the RW onto the rear portion of handles to decrease unsteadiness and chances of falling if he leaned forward. Patient acknowledged  and agreed.  Patient ambulated with RW for 300' with SUP-mod I with continued, but improved, gait deviations.   Physical Performance: Patient performed the BERG balance assessment with SBA-SUP with intermittent standing rest breaks at RW. PT discussed results with patient and wife as patient has made significant improvements, though remains in high fall risk category. Patient and spouse acknowledged and  agreed.  Patient performed gait speed with RW independently with results noted above. Results also discussed with patient for comprehension of improvements and why this is assessed. Patient and spouse acknowledged.     TREATMENT:                                                                                                                    DATE: 05/29/2024 Prosthetic Training with Transfemoral Prosthesis Patient continues to have right anterior tibial pain.  PT spoke with daughter due to the end of the session agrees with PT that is probably overuse stress related pain.  Dr. Harden did note some calcification to the interosseous ligament.   PT recommended increasing wear of prosthesis from arising in the morning until going to bed in the evening.  He can switch and clean liner with bathing.  PT discussed fall risk when prosthesis is off and stress to his right lower extremity.  Patient reports that he currently only has 1 prosthetic liner.  PT spoke with prosthetist who is working with the company to replace the second 1 that tore and was still under the warranty.  PT also recommended discussing getting a new insert or support supportive shoe for his right foot as his current shoe can also be a part of the pain problem he is having.  Patient wife verbalized understanding of above. PT demo and verbal cues on how to transfer standing to the floor and back to upright using BUEs on chair seat.  If he falls he needs to check first that he did not break anything and if so call 911.  Secondly he needs to check that the prosthetic socket did not rotate on his limb and if so correct it prior to trying to stand.  Patient was able to demonstrate transfer to the floor and back to standing via half kneeling using the chair with bilateral upper extremity support with cues and contact-guard assistance for safety.    HOME EXERCISE PROGRAM: Access Code: [email or web address] URL: [email or web address] Date:  03/20/2024 Prepared by: Grayce Spatz  Exercises - Modified Debby Stretch  - 2 x daily - 7 x weekly - 1 sets - 2-3 reps - 30 seconds hold - Hip Flexor Stretch at Edge of Bed  - 2 x daily - 7 x weekly - 1 sets - 2-3 reps - 30 seconds hold - Standing posture with back to counter  - 2 x daily - 7 x weekly - 1 sets - 1 reps - 5-10 minutes hold - Upright Stance at Door Frame Single Arm  - 3-6 x daily - 7 x weekly - 1 sets - 2  reps - 2 deep breathes hold - Upright Stance at Door Frame with Both Arms  - 3-6 x daily - 7 x weekly - 1 sets - 2 reps - 2 deep breathes hold  Do each exercise 1-2  times per day Do each exercise 5-10 repetitions Hold each exercise for 2 seconds to feel your location  AT SINK FIND YOUR MIDLINE POSITION AND PLACE FEET EQUAL DISTANCE FROM THE MIDLINE.  Try to find this position when standing still for activities.   USE TAPE ON FLOOR TO MARK THE MIDLINE POSITION which is even with middle of sink.  You also should try to feel with your limb pressure in socket.  You are trying to feel with limb what you used to feel with the bottom of your foot.  Side to Side Shift: Moving your hips only (not shoulders): move weight onto your left leg, HOLD/FEEL pressure in socket.  Move back to equal weight on each leg, HOLD/FEEL pressure in socket. Move weight onto your right leg, HOLD/FEEL pressure in socket. Move back to equal weight on each leg, HOLD/FEEL pressure in socket. Repeat.  Start with both hands on sink, progress to hand on prosthetic side only, then no hands.  Front to Back Shift: Moving your hips only (not shoulders): move your weight forward onto your toes, HOLD/FEEL pressure in socket. Move your weight back to equal Flat Foot on both legs, HOLD/FEEL  pressure in socket. Move your weight back onto your heels, HOLD/FEEL  pressure in socket. Move your weight back to equal on both legs, HOLD/FEEL  pressure in socket. Repeat.  Start with both hands on sink, progress to hand on  prosthetic side only, then no hands.  Moving Cones / Cups: With equal weight on each leg: Hold on with one hand the first time, then progress to no hand supports. Move cups from one side of sink to the other. Place cups ~2" out of your reach, progress to 10" beyond reach.  Place one hand in middle of sink and reach with other hand. Do both arms.  Then hover one hand and move cups with other hand.  Overhead/Upward Reaching: alternated reaching up to top cabinets or ceiling if no cabinets present. Keep equal weight on each leg. Start with one hand support on counter while other hand reaches and progress to no hand support with reaching.  ace one hand in middle of sink and reach with other hand. Do both arms.  Then hover one hand and move cups with other hand.  5.   Looking Over Shoulders: With equal weight on each leg: alternate turning to look over your shoulders with one hand support on counter as needed.  Start with head motions only to look in front of shoulder, then even with shoulder and progress to looking behind you. To look to side, move head /eyes, then shoulder on side looking pulls back, shift more weight to side looking and pull hip back. Place one hand in middle of sink and let go with other hand so your shoulder can pull back. Switch hands to look other way.   Then hover one hand and look over shoulder. If looking right, use left hand at sink. If looking left, use right hand at sink. 6.  Stepping with leg that is not amputated:  Move items under cabinet out of your way. Shift your hips/pelvis so weight on prosthesis. Tighten muscles in hip on prosthetic side.  SLOWLY step other leg so front of foot is  in cabinet. Then step back to floor.    ASSESSMENT:  CLINICAL IMPRESSION:    Patient was able to improve balance using upper extremity resistance with Thera-Band.  He would benefit from this as an HEP but needs further instruction at next session.  Patient's gait with a cane has improved but is  limited by some pain in the residual limb still.  Patient will continue to benefit from skilled PT.   OBJECTIVE IMPAIRMENTS: Abnormal gait, decreased activity tolerance, decreased balance, decreased endurance, decreased knowledge of condition, decreased knowledge of use of DME, decreased mobility, difficulty walking, decreased ROM, decreased strength, impaired flexibility, postural dysfunction, prosthetic dependency , and pain.   ACTIVITY LIMITATIONS: carrying, lifting, bending, sitting, standing, squatting, stairs, transfers, bathing, toileting, dressing, reach over head, and locomotion level  PARTICIPATION LIMITATIONS: meal prep, cleaning, laundry, driving, shopping, community activity, and yard work  PERSONAL FACTORS: Age, Fitness, Past/current experiences, Time since onset of injury/illness/exacerbation, and 3+ comorbidities: see PMH are also affecting patient's functional outcome.   REHAB POTENTIAL: Good  CLINICAL DECISION MAKING: Evolving/moderate complexity  EVALUATION COMPLEXITY: Moderate   GOALS: Goals reviewed with patient? Yes  SHORT TERM GOALS: Target date: 04/05/2024  Patient donnes prosthesis modified independent & verbalizes proper cleaning. Baseline: SEE OBJECTIVE DATA Goal status: MET, 04/10/2024  2.  Patient tolerates prosthesis >12 hrs total /day without skin issues or limb pain >7/10 after standing. Baseline: patient reports wearing 4-5 hr/day Goal status: GOAL MET, 70/23/2025  3.  Patient able to reach 7 and look over both shoulders with RW support with Modified independent. Baseline: SEE OBJECTIVE DATA Goal status: MET, 04/10/2024  4. Patient ambulates 125' with RW & prosthesis with supervision. Baseline: SEE OBJECTIVE DATA Goal status: MET, 04/10/2024  5. Patient negotiates ramps & curbs with RW & prosthesis with supervision. Baseline: SEE OBJECTIVE DATA Goal status: MET, 04/10/2024  LONG TERM GOALS: Target date: 08/23/2024  Patient demonstrates & verbalized  understanding of prosthetic care to enable safe utilization of prosthesis. Baseline: SEE OBJECTIVE DATA Goal status: MET 05/29/2024  Patient tolerates prosthesis wear >90% of awake hours without skin issues or limb pain >4/10. Baseline: SEE OBJECTIVE DATA Goal status: Ongoing     06/19/2024  BERG >/= 25/56 to indicate lower fall risk Baseline: SEE OBJECTIVE DATA Goal status: GOAL MET, 05/31/2024  Patient ambulates >300' with prosthesis and LRAD independently Baseline: SEE OBJECTIVE DATA Goal status: Ongoing    06/19/2024  Patient negotiates ramps, curbs & stairs with single rail with prosthesis and LRAD independently. Baseline: SEE OBJECTIVE DATA Goal status: Ongoing    06/19/2024  6.  Patient reports Patient-Specific Activity Score improved the average by 3 to indicate improvement in functional activities.   Baseline: SEE OBJECTIVE DATA Goal status: Ongoing  06/19/2024  PLAN:  PT FREQUENCY: 1-2x/week  PT DURATION: 12 weeks , 2x/wk for at least 6 weeks   PLANNED INTERVENTIONS: 97164- PT Re-evaluation, 97750- Physical Performance Testing, 97110-Therapeutic exercises, 97530- Therapeutic activity, 97112- Neuromuscular re-education, 97535- Self Care, [id number]- Manual therapy, [phone]- Gait training, [phone]- Prosthetic Initial , [phone]- Orthotic/Prosthetic subsequent, Patient/Family education, Balance training, Stair training, Joint mobilization, and DME instructions  PLAN FOR NEXT SESSION: Instruct in HEP for balance with green Thera-Band, posture, gait with cane, gait with speed changes, PSFS   Grayce Spatz, PT, DPT 06/19/2024, 4:40 PM    Date of referral: 02/13/2024  Referring provider: Harden Jerona GAILS, MD Referring diagnosis? S10.387 (ICD-10-CM) - Hx of AKA (above knee amputation), left (HCC)  Treatment diagnosis? (if different than referring diagnosis)  R26.89, M62.81, M79.605, M25.652, R29.3, R26.81, L98.498  What was this (referring dx) caused by? Other: Lt AKA  Nature of  Condition: Chronic (continuous duration > 3 months)   Laterality: Lt  Current Functional Measure Score: Patient Specific Functional Scale Score:4  Objective measurements identify impairments when they are compared to normal values, the uninvolved extremity, and prior level of function.  [x]  Yes  []  No  Objective assessment of functional ability: Moderate functional limitations   Briefly describe symptoms: Patient is dependent in prosthetic care and use with high risk of falls. Gait deviation, muscle weakness, pain in Lt residual limb, hip flexion contracture, heavy reliance on AD  How did symptoms start: amputation due to vascular issues  Average pain intensity:  Last 24 hours: 3-4/10  Past week: 3-7/10  How often does the pt experience symptoms? Constantly  How much have the symptoms interfered with usual daily activities? Quite a bit  How has condition changed since care began at this facility? A little better  In general, how is the patients overall health? Good   BACK PAIN (STarT Back Screening Tool) No

## 2024-06-21 ENCOUNTER — Ambulatory Visit: Admitting: Physical Therapy

## 2024-06-21 ENCOUNTER — Encounter: Payer: Self-pay | Admitting: Physical Therapy

## 2024-06-21 DIAGNOSIS — R2681 Unsteadiness on feet: Secondary | ICD-10-CM

## 2024-06-21 DIAGNOSIS — M25652 Stiffness of left hip, not elsewhere classified: Secondary | ICD-10-CM | POA: Diagnosis not present

## 2024-06-21 DIAGNOSIS — M6281 Muscle weakness (generalized): Secondary | ICD-10-CM

## 2024-06-21 DIAGNOSIS — M79605 Pain in left leg: Secondary | ICD-10-CM

## 2024-06-21 DIAGNOSIS — R2689 Other abnormalities of gait and mobility: Secondary | ICD-10-CM | POA: Diagnosis not present

## 2024-06-21 DIAGNOSIS — R293 Abnormal posture: Secondary | ICD-10-CM

## 2024-06-21 DIAGNOSIS — L98498 Non-pressure chronic ulcer of skin of other sites with other specified severity: Secondary | ICD-10-CM

## 2024-06-21 NOTE — Therapy (Signed)
 OUTPATIENT PHYSICAL THERAPY PROSTHETIC TREATMENT    Patient Name: Mike Floyd MRN: [id number] DOB:1960/09/08, 63 y.o., male Today's Date: 06/21/2024  END OF SESSION:  PT End of Session - 06/21/24 1309     Visit Number 19    Number of Visits 40    Date for Recertification  08/23/24    Authorization Type [employer] MEDICARE    Authorization Time Period $45 copay 8 Therapy Visit(s) from 05/31/2024 to 06/28/2024    Authorization - Visit Number 4    Authorization - Number of Visits 8    Progress Note Due on Visit 20    PT Start Time 1306    PT Stop Time 1344    PT Time Calculation (min) 38 min    Equipment Utilized During Treatment Gait belt    Activity Tolerance Patient tolerated treatment well;Patient limited by fatigue    Behavior During Therapy WFL for tasks assessed/performed             Past Medical History:  Diagnosis Date   AICD (automatic cardioverter/defibrillator) present 11/2007   Removed in 2018, a. 11/2007 S[facility] Current VR - single lead ICD  - Removed 2018 - it was burning me   Anxiety    CAD (coronary artery disease)    non-obstructive CAD by Cor CT in 2020 // Myoview  3/22: EF 57, small inf-sept defect c/w scar, no ischemia; low risk      Chest pain    a. 10/2007 Cath:  normal Cors.   CKD (chronic kidney disease), stage II    DDD (degenerative disc disease), lumbar    Diabetes mellitus DX: 2010   type 2   Erosive esophagitis    a. per EGD (08/2011), Dr. Golda - Erosive reflux esophagitis improved but not completely healed since previous EGD 3 years ago. Bx showing  ulcerated gatroesophageal junction mucosa. negative for H. pylori   GERD (gastroesophageal reflux disease)    Gout    Hearing deficit    a. wear bilateral hearing aides   History of hiatal hernia    History of kidney stones    passed stones, no surgery   Hypertension    Mildly dilatd aortic root (HCC)    CMR 4/22: EF 52, no LGE; d/w Dr. Ione root 38 mm (mildly dilated)   Myocardial  infarction [facility]) 2011   Neuropathy    Feet and legs   Nonischemic dilated cardiomyopathy (HCC)    a. H/O EF as low as 35-40% by LV gram 10/2007;  b. Echo 02/2011 EF 50-55%, inf HK, Gr 1 DD // CMR 4/22: EF 52, no LGE; d/w Dr. Ione root 38 mm (mildly dilated)    Renal insufficiency    Sleep apnea    pt doesnt use, statesI cant afford one. PCP aware   Stroke (HCC)    mini-stroke in 2014   TIA (transient ischemic attack)    July, 2013   Tobacco abuse, in remission 06/27/2009   Discontinued in 2009     Wears dentures    top plate   WPW (Wolff-Parkinson-White syndrome)    a. s/p RFCA @ [facility] - 1999   Past Surgical History:  Procedure Laterality Date   AMPUTATION Left 05/19/2022   Procedure: TRANSMETATARSAL AMPUTATION LEFT FOOT;  Surgeon: Harden Jerona GAILS, MD;  Location: [facility] OR;  Service: Orthopedics;  Laterality: Left;   AMPUTATION Left 06/17/2023   Procedure: LEFT BELOW KNEE AMPUTATION;  Surgeon: Harden Jerona GAILS, MD;  Location: [facility] OR;  Service: Orthopedics;  Laterality:  Left;   AMPUTATION Left 08/19/2023   Procedure: LEFT ABOVE KNEE AMPUTATION;  Surgeon: Harden Jerona GAILS, MD;  Location: [facility] OR;  Service: Orthopedics;  Laterality: Left;   APPENDECTOMY     BACK SURGERY     1995   CARDIAC CATHETERIZATION     2009   CARDIAC DEFIBRILLATOR PLACEMENT     ICD was removed in 2018   CHOLECYSTECTOMY     CHOLECYSTECTOMY, LAPAROSCOPIC     11/2007   COLONOSCOPY W/ POLYPECTOMY  2009   ELBOW SURGERY Left 06/2010   ESOPHAGEAL DILATION N/A 11/08/2014   Procedure: ESOPHAGEAL DILATION;  Surgeon: Claudis RAYMOND Rivet, MD;  Location: [facility] ORS;  Service: Endoscopy;  Laterality: N/A;  #56,    ESOPHAGOGASTRODUODENOSCOPY  03/31/2012   also 08/2011; [name]   ESOPHAGOGASTRODUODENOSCOPY (EGD) WITH PROPOFOL  N/A 11/08/2014   Procedure: ESOPHAGOGASTRODUODENOSCOPY (EGD) WITH PROPOFOL ;  Surgeon: Claudis RAYMOND Rivet, MD;  Location: [facility] ORS;  Service: Endoscopy;  Laterality: N/A;  Hiatus is 44 , [place]unction is 37   FOOT  ARTHRODESIS Left 10/24/2020   Procedure: LEFT GASTROCNEMIUS RECESSION, DORSIFLEXION OSTEOTOMY 1ST MT;  Surgeon: Harden Jerona GAILS, MD;  Location: [facility] OR;  Service: Orthopedics;  Laterality: Left;   FOOT ARTHRODESIS Right 01/09/2021   Procedure: CLOSING WEDGE OSTEOTOMY RIGHT 1ST METATARSAL;  Surgeon: Harden Jerona GAILS, MD;  Location: [facility] OR;  Service: Orthopedics;  Laterality: Right;   GASTROCNEMIUS RECESSION Right 01/09/2021   Procedure: RIGHT GASTROCNEMIUS RECESSION;  Surgeon: Harden Jerona GAILS, MD;  Location: [facility] OR;  Service: Orthopedics;  Laterality: Right;   ICD LEAD REMOVAL N/A 10/07/2016   Procedure: ICD LEAD REMOVAL ;  Surgeon: Danelle LELON Birmingham, MD;  Location: [facility] OR;  Service: Cardiovascular;  Laterality: N/A;   MULTIPLE TOOTH EXTRACTIONS     RADIOFREQUENCY ABLATION  1999   WPW; performed at [facility]   STUMP REVISION Left 07/22/2023   Procedure: REVISION LEFT BELOW KNEE AMPUTATION;  Surgeon: Harden Jerona GAILS, MD;  Location: [facility] OR;  Service: Orthopedics;  Laterality: Left;   TEE WITHOUT CARDIOVERSION N/A 10/07/2016   Procedure: TRANSESOPHAGEAL ECHOCARDIOGRAM (TEE);  Surgeon: Danelle LELON Birmingham, MD;  Location: [facility] OR;  Service: Cardiovascular;  Laterality: N/A;   Patient Active Problem List   Diagnosis Date Noted   Dyslipidemia 11/03/2023   Essential hypertension 11/03/2023   Type 2 diabetes mellitus with peripheral neuropathy (HCC) 11/03/2023   BPH (benign prostatic hyperplasia) 11/03/2023   GERD without esophagitis 11/03/2023   S/P AKA (above knee amputation) unilateral, left (HCC) 08/19/2023   Dehiscence of amputation stump of left lower extremity (HCC) 07/22/2023   Ischemia of site of left below knee amputation (HCC) 07/22/2023   S/P BKA (below knee amputation) unilateral, left (HCC) 07/22/2023   Below-knee amputation of left lower extremity (HCC) 06/17/2023   Acute osteomyelitis of metatarsal bone of left foot (HCC)    Subacute osteomyelitis, left ankle and foot (HCC) 05/13/2022   Non-pressure  chronic ulcer of other part of right foot limited to breakdown of skin (HCC)    Contracture of right Achilles tendon    Mildly dilatd aortic root (HCC)    Contracture of left Achilles tendon    Metatarsal deformity, left    CAD (coronary artery disease)    Hallux hammertoe, left 12/11/2019   Diarrhea 06/27/2019   Diastasis recti 08/22/2018   Umbilical hernia without obstruction and without gangrene 08/22/2018   Vitamin D  deficiency 06/23/2017   Essential hypertension, benign 06/15/2017   Mixed hyperlipidemia 06/15/2017   Class 1 obesity due  to excess calories with serious comorbidity and body mass index (BMI) of 31.0 to 31.9 in adult 06/15/2017   Malfunction of implantable cardioverter-defibrillator (ICD) electrode 10/07/2016   Chronic kidney disease (CKD), stage III (moderate) (HCC) 04/04/2015   Bladder neck obstruction 03/21/2015   Difficult or painful urination 03/21/2015   Flank pain 03/21/2015   Delayed onset of urination 03/21/2015   Calculus of kidney 03/21/2015   Erosive esophagitis 12/27/2013   Hypotension due to drugs 07/02/2013   Chronic systolic heart failure (HCC) 04/24/2013   CAP (community acquired pneumonia) 03/16/2013   HTN (hypertension) 03/15/2013   Hydrocele 03/15/2013   Spermatocele 03/15/2013   DOE (dyspnea on exertion) 03/15/2013   OSA (obstructive sleep apnea) 01/09/2013   Chronic kidney disease, stage 3 (HCC) 01/03/2013   Chest pain 12/26/2012   History of diagnostic tests 09/06/2012   TIA (transient ischemic attack)    Cardiomyopathy, nonischemic (HCC) 02/24/2010   AICD (automatic cardioverter/defibrillator) 02/24/2010   DM type 2 causing vascular disease (HCC) 06/27/2009   Gout 06/27/2009   Tobacco abuse, in remission 06/27/2009   COLONIC POLYPS 10/31/2008   GERD (gastroesophageal reflux disease) 10/31/2008    PCP: Gladis Lauraine BRAVO, NP  REFERRING PROVIDER: Harden Jerona GAILS, MD  REFERRING DIAG: [phone] (ICD-10-CM) - Hx of AKA (above knee  amputation), left (HCC)   THERAPY DIAG:  Other abnormalities of gait and mobility  Muscle weakness (generalized)  Stiffness of left hip, not elsewhere classified  Pain in left leg  Abnormal posture  Unsteadiness on feet  Non-pressure chronic ulcer of skin of other sites with other specified severity [facility])  Rationale for Evaluation and Treatment: Rehabilitation  ONSET DATE: Prosthesis delivery 02/20/2024  SUBJECTIVE:   SUBJECTIVE STATEMENT:   He got knew liner and prosthetist did an alignment change.  She plans to start on socket revision in Jan.    PERTINENT HISTORY: DM2, GERD, hypertension, dyslipidemia, CAD, L BKA (06/17/23), neuropathy  Eval: Patient is a 63 year old male who presents to physical therapy with a left AKA (08/19/2023) with a new prosthesis delivery on 02/20/2024. Patient had previous home health PT prior to Lt AKA. He is able to mobilize with modified independence w/ walker. Wears prosthesis about ten hours a day and by the end of the night he takes it off due to fatigue and pain.   PAIN:  Are you having pain? Yes: NPRS scale:  3/10 today,7/10 at worst since last PT Pain location: Femur in prosthesis and pain in the Rt leg as well Pain description: feels like its been hammered Aggravating factors: keeping prosthesis on for long periods of times, sitting Relieving factors: taking off prosthesis and medications  PRECAUTIONS: Fall  RED FLAGS: None   WEIGHT BEARING RESTRICTIONS: No  FALLS:  Has patient fallen in last 6 months? Yes. Number of falls 2, forgetting he doesn't have a leg and put weight onto the left side. He did not hit the floor, only fell into another person. Occurred when transferring from sitting to standing  LIVING ENVIRONMENT: Lives with: lives with their spouse Lives in: Other Trailer on grass Stairs: Yes: External: 4 steps; can reach both Has following equipment at home: Single point cane, Environmental Consultant - 2 wheeled, Wheelchair (manual), and  shower chair  OCCUPATION: retired   PLOF: Independent with household mobility with device  PATIENT GOALS: ambulating and ADLs with cane   NEXT MD VISIT: Office MD visit: 03/21/2024   OBJECTIVE:  Patient-Specific Activity Scoring Scheme  0 represents "unable to perform." 10 represents "able  to perform at prior level. 0 1 2 3 4 5 6 7 8 9  10 (Date and Score)   Activity Eval  06/19/24   1. Walking w/ cane  2 3   2. Standing for ADLs  5  7  3. Stairs 5 7  4.    5.    Score 4 5.66   Total score = sum of the activity scores/number of activities Minimum detectable change (90%CI) for average score = 2 points Minimum detectable change (90%CI) for single activity score = 3 points  DIAGNOSTIC FINDINGS: none post-amputation  COGNITION: Overall cognitive status: Within functional limits for tasks assessed   SENSATION: WFL  MUSCLE LENGTH:   POSTURE: rounded shoulders, forward head, flexed trunk , and weight shift right  LOWER EXTREMITY ROM:  ROM P:passive  A:active Left eval 06/14/2024  Hip flexion    Hip extension Thomas position: P: - 40* P: -27  Hip abduction    Hip adduction    Hip internal rotation    Hip external rotation    Knee flexion    Knee extension    Ankle dorsiflexion    Ankle plantarflexion    Ankle inversion    Ankle eversion     (Blank rows = not tested)  LOWER EXTREMITY MMT:  MMT Right eval Left eval  Hip flexion 3/5 3/5  Hip extension 3/5 3-/5  Hip abduction  3-/5  Hip adduction    Hip internal rotation    Hip external rotation    Knee flexion    Knee extension 3/5   Ankle dorsiflexion    Ankle plantarflexion    Ankle inversion    Ankle eversion    At Evaluation all strength testing is grossly seated and functionally standing / gait. (Blank rows = not tested)  BED MOBILITY:  Sit to supine Complete Independence Supine to sit Complete Independence Rolling to Right Complete Independence  TRANSFERS: 04/10/2024: Pt able stand  to/from sit into chair without armrest using BUEs on seat and RW for stabilization with supervision/verbal cues.  Eval: Sit to stand: supervision requires a stable object to arise, stabilization w/ walker using UE support, with delayed weight shift onto the prosthesis for stabilization, and uses UE to bring Lt prosthesis back below his body  Stand to sit: supervision requires a stable object to arise, stabilization w/ walker using UE support, with delayed weight shift onto the prosthesis for stabilization  FUNCTIONAL TESTs:  04/10/2024: Patient able to reach 10 forward and look over his shoulders with single upper extremity support on RW.   Eval:  BERG score 11/56 05/31/2024: performed with standing rest breaks at RW, but performed below tasks without RW   [facility] PT Assessment - 05/31/24 0001       Berg Balance Test   Sit to Stand Able to stand using hands after several tries    Standing Unsupported Able to stand 2 minutes with supervision    Sitting with Back Unsupported but Feet Supported on Floor or Stool Able to sit safely and securely 2 minutes    Stand to Sit Uses backs of legs against chair to control descent    Transfers Able to transfer safely, definite need of hands    Standing Unsupported with Eyes Closed Able to stand 10 seconds with supervision    Standing Unsupported with Feet Together Able to place feet together independently and stand for 1 minute with supervision    From Standing, Reach Forward with Outstretched Arm Reaches forward but needs  supervision    From Standing Position, Pick up Object from Floor Able to pick up shoe, needs supervision    From Standing Position, Turn to Look Behind Over each Shoulder Needs supervision when turning    Turn 360 Degrees Needs assistance while turning    Standing Unsupported, Alternately Place Feet on Step/Stool Needs assistance to keep from falling or unable to try    Standing Unsupported, One Foot in Front Able to plae foot ahead of  the other independently and hold 30 seconds    Standing on One Leg Tries to lift leg/unable to hold 3 seconds but remains standing independently    Total Score 29    Berg comment: BERG < 36 high risk for falls (close to 100%) 46-51 moderate (>50%) 37-45 significant (>80%) 52-55 lower (> 25%)         BERG: 06/14/2024 31/56 performed with RW in front of patient and intermittently using RW during breaks   GAIT: 04/10/2024: Patient ambulated 125' with RW and supervision.   Patient negotiated 12*ramp and 6.5 curb with RW and supervision  Eval: Gait pattern: step to pattern, decreased step length- Right, decreased stance time- Left, decreased hip/knee flexion- Left, circumduction- Left, Left hip hike, antalgic, trendelenburg, lateral lean- Right, trunk flexed, abducted- Left, and poor foot clearance- Left Distance walked: 62 ft Assistive device utilized: Walker - 2 wheeled and AKA prosthesis Level of assistance: SBA / steady with RW support but needs cues  Gait velocity: 0.72 ft/sec Comments: excessive weightbearing on BUEs using RW   05/31/2024 Gait Speed:  Self-selected: 1.59 ft/sec Fast-as-comfortable: 2.29 ft/sec    RAMP : not tested  CURB: not tested  CURRENT PROSTHETIC WEAR ASSESSMENT:   Patient is dependent with: skin check, residual limb care, care of non-amputated limb, prosthetic cleaning, ply sock cleaning, correct ply sock adjustment, proper wear schedule/adjustment, and proper weight-bearing schedule/adjustment Donning prosthesis: SBA / verbal cues Doffing prosthesis: Complete Independence Prosthetic wear tolerance: 6-8 hours, 1x/day, 7 days/week Prosthetic weight bearing tolerance: tolerated standing for 10 minutes with partial weight on prosthesis during BERG balance test with no increased pain. Although he reports standing pain with activities at home. Edema: minimal Residual limb condition: 3x25mm superficial wound distally located, scar with mild adherence, no  adductor roll, normal skin color, decreased hair growth present Prosthetic description: silicone liner with suction ring suspension, ischial containment socket with flexible inner socket, rotator unit, SAFETY (single axis friction engaging with an extension assist), flexible keel foot  TODAY'S TREATMENT:                                                                                                                    DATE: 06/21/2024 Prosthetic training with transfemoral prosthesis: sit to/from stand 18 chair without armrests using RUE on seat with cane for stabilization with supervision.   Patient ambulated 41' x 2 with cane stand-alone tip with min/CGA. 2nd rep carrying open bottle of water  with no spills but one misstep requiring minA to balance.   PT cues  on upright posture, step length and weight shift over prosthesis in stance. Pt neg stairs with single rail & cane (4 steps left rail / RUE cane & 4 steps right rail / LUE cane) with verbal cues and CGA.    Neuromuscular reeducation working on standing balance Standing balance with upper extremity resistance using green Thera-Band to facilitate core stabilization and weightbearing through both lower extremities.  PT min/CGA with tactile cues and verbal cues for upright posture and balance reactions.  Single upper extremity alternating sides 5 reps each and bilateral upper extremities 10 reps: Row, forward reach and biceps curl/upward reach. PT demo & verbal cues & HO for HEP including safe set-up & use of bands. Pt verbalized understanding after performing during today's session.     TREATMENT:                                                                                                                    DATE: 06/19/2024 Prosthetic training with transfemoral prosthesis: PT reviewed recommendations for prosthetist at appointment tomorrow to perform a dynamic alignment and modify the socket to address pain issues.  Patient verbalized  understanding. Patient ambulated 50' x 2 with cane stand-alone tip with min/CGA.  PT cues on upright posture, step length and weight shift over prosthesis in stance. PT demo and verbal cues on negotiating curb with a cane and prosthesis.  Patient negotiated 6.5 curb 2 reps with min to modA.    Neuromuscular reeducation working on standing balance Standing balance with upper extremity resistance using green Thera-Band to facilitate core stabilization and weightbearing through both lower extremities.  PT min/CGA with tactile cues and verbal cues for upright posture and balance reactions.  Single upper extremity alternating sides 5 reps each and bilateral upper extremities 10 reps: Row, forward reach and biceps curl/upward reach.   TREATMENT:                                                                                                                    DATE: 06/14/2024 Prosthetic training with transfemoral prosthesis: Patient independently doffed prosthesis and silicone liner in order for PT to perform skin check around incision site as well as anterior thigh/femur. PT discussed redness on anterior thigh and palpated leading to increased pain/soreness with visible reaction from patient. PT educated on importance of donning silicone liner appropriately as well as taking valve out while donning in order to seat the residual limb in the prosthesis without leaving an air bubble. Patient performed donning with verbal cues  only. PT then observed the [facility] stretch in order to note any improvements in hip flexor contracture (with results noted above). PT then observed patient perform sit to stand, ambulation for ~30', then stand to sit while discussing importance of scheduling a follow up with prosthetist.   Physical Performance:  Patient performed BERG balance assessment with results being 31/56 and discussed with patient on improvements. However, PT also discussed with patient that he is still a high  fall risk for higher level balance tasks without the RW.    TREATMENT:                                                                                                                    DATE: 05/31/2024 Prosthetic training with transfemoral prosthesis: PT discussed importance of doffing prosthesis and silicone liner for at least 6 hours at a time to let residual limb breathe/dry (whether that be overnight/etc). PT also discussed that patient will be receiving a new silicone liner that is covered under warranty secondary to first one ripping/tearing. Patient and spouse acknowledge.  PT discussed continuation of PT services in order to improve balance, independence, strength, and safety. Patient and spouse acknowledge and agree.  Patient performed ramp and curb with RW with SUP-MOD I. PT discussed with patient that only step of descending ramp to improve upon would be hand placement upon the RW onto the rear portion of handles to decrease unsteadiness and chances of falling if he leaned forward. Patient acknowledged and agreed.  Patient ambulated with RW for 300' with SUP-mod I with continued, but improved, gait deviations.   Physical Performance: Patient performed the BERG balance assessment with SBA-SUP with intermittent standing rest breaks at RW. PT discussed results with patient and wife as patient has made significant improvements, though remains in high fall risk category. Patient and spouse acknowledged and agreed.  Patient performed gait speed with RW independently with results noted above. Results also discussed with patient for comprehension of improvements and why this is assessed. Patient and spouse acknowledged.      HOME EXERCISE PROGRAM: Access Code: [email or web address] URL: [email or web address] Date: 06/21/2024 Prepared by: Grayce Spatz  Exercises - Modified Thomas Stretch  - 2 x daily - 7 x weekly - 1 sets - 2-3 reps - 30 seconds hold - Hip Flexor Stretch at Edge of Bed   - 2 x daily - 7 x weekly - 1 sets - 2-3 reps - 30 seconds hold - Standing posture with back to counter  - 2 x daily - 7 x weekly - 1 sets - 1 reps - 5-10 minutes hold - Upright Stance at Door Frame Single Arm  - 3-6 x daily - 7 x weekly - 1 sets - 2 reps - 2 deep breathes hold - Upright Stance at Door Frame with Both Arms  - 3-6 x daily - 7 x weekly - 1 sets - 2 reps - 2 deep breathes hold - Alternating Punch with Resistance  - 1 x daily - 4-5  x weekly - 1 sets - 10 reps - 5 seconds hold - Standing Scapular Protraction with Resistance  - 1 x daily - 4-5 x weekly - 1 sets - 10 reps - 5 seconds hold - Standing alternate rows with resistance  - 1 x daily - 4-5 x weekly - 1 sets - 10 reps - 5 seconds hold - Standing Row with Anchored Resistance  - 1 x daily - 4-5 x weekly - 1 sets - 10 reps - 5 seconds hold - Alternating elbow flexion with resistance  - 1 x daily - 4-5 x weekly - 1 sets - 10 reps - 5 seconds hold - Standing Bicep Curls with Resistance  - 1 x daily - 4-5 x weekly - 1 sets - 10 reps - 5 seconds hold  Do each exercise 1-2  times per day Do each exercise 5-10 repetitions Hold each exercise for 2 seconds to feel your location  AT SINK FIND YOUR MIDLINE POSITION AND PLACE FEET EQUAL DISTANCE FROM THE MIDLINE.  Try to find this position when standing still for activities.   USE TAPE ON FLOOR TO MARK THE MIDLINE POSITION which is even with middle of sink.  You also should try to feel with your limb pressure in socket.  You are trying to feel with limb what you used to feel with the bottom of your foot.  Side to Side Shift: Moving your hips only (not shoulders): move weight onto your left leg, HOLD/FEEL pressure in socket.  Move back to equal weight on each leg, HOLD/FEEL pressure in socket. Move weight onto your right leg, HOLD/FEEL pressure in socket. Move back to equal weight on each leg, HOLD/FEEL pressure in socket. Repeat.  Start with both hands on sink, progress to hand on prosthetic  side only, then no hands.  Front to Back Shift: Moving your hips only (not shoulders): move your weight forward onto your toes, HOLD/FEEL pressure in socket. Move your weight back to equal Flat Foot on both legs, HOLD/FEEL  pressure in socket. Move your weight back onto your heels, HOLD/FEEL  pressure in socket. Move your weight back to equal on both legs, HOLD/FEEL  pressure in socket. Repeat.  Start with both hands on sink, progress to hand on prosthetic side only, then no hands.  Moving Cones / Cups: With equal weight on each leg: Hold on with one hand the first time, then progress to no hand supports. Move cups from one side of sink to the other. Place cups ~2" out of your reach, progress to 10" beyond reach.  Place one hand in middle of sink and reach with other hand. Do both arms.  Then hover one hand and move cups with other hand.  Overhead/Upward Reaching: alternated reaching up to top cabinets or ceiling if no cabinets present. Keep equal weight on each leg. Start with one hand support on counter while other hand reaches and progress to no hand support with reaching.  ace one hand in middle of sink and reach with other hand. Do both arms.  Then hover one hand and move cups with other hand.  5.   Looking Over Shoulders: With equal weight on each leg: alternate turning to look over your shoulders with one hand support on counter as needed.  Start with head motions only to look in front of shoulder, then even with shoulder and progress to looking behind you. To look to side, move head /eyes, then shoulder on side looking pulls back, shift  more weight to side looking and pull hip back. Place one hand in middle of sink and let go with other hand so your shoulder can pull back. Switch hands to look other way.   Then hover one hand and look over shoulder. If looking right, use left hand at sink. If looking left, use right hand at sink. 6.  Stepping with leg that is not amputated:  Move items under cabinet out  of your way. Shift your hips/pelvis so weight on prosthesis. Tighten muscles in hip on prosthetic side.  SLOWLY step other leg so front of foot is in cabinet. Then step back to floor.    ASSESSMENT:  CLINICAL IMPRESSION:    Patient appears to understand HEP for balance using green theraband including safety issues.  Pt improved ability to ambulate with cane but carrying something initially threw him off.  He appears to understand how to neg stairs with single rail using cane.  Patient will continue to benefit from skilled PT.   OBJECTIVE IMPAIRMENTS: Abnormal gait, decreased activity tolerance, decreased balance, decreased endurance, decreased knowledge of condition, decreased knowledge of use of DME, decreased mobility, difficulty walking, decreased ROM, decreased strength, impaired flexibility, postural dysfunction, prosthetic dependency , and pain.   ACTIVITY LIMITATIONS: carrying, lifting, bending, sitting, standing, squatting, stairs, transfers, bathing, toileting, dressing, reach over head, and locomotion level  PARTICIPATION LIMITATIONS: meal prep, cleaning, laundry, driving, shopping, community activity, and yard work  PERSONAL FACTORS: Age, Fitness, Past/current experiences, Time since onset of injury/illness/exacerbation, and 3+ comorbidities: see PMH are also affecting patient's functional outcome.   REHAB POTENTIAL: Good  CLINICAL DECISION MAKING: Evolving/moderate complexity  EVALUATION COMPLEXITY: Moderate   GOALS: Goals reviewed with patient? Yes  SHORT TERM GOALS: Target date: 04/05/2024  Patient donnes prosthesis modified independent & verbalizes proper cleaning. Baseline: SEE OBJECTIVE DATA Goal status: MET, 04/10/2024  2.  Patient tolerates prosthesis >12 hrs total /day without skin issues or limb pain >7/10 after standing. Baseline: patient reports wearing 4-5 hr/day Goal status: GOAL MET, 70/23/2025  3.  Patient able to reach 7 and look over both shoulders with  RW support with Modified independent. Baseline: SEE OBJECTIVE DATA Goal status: MET, 04/10/2024  4. Patient ambulates 125' with RW & prosthesis with supervision. Baseline: SEE OBJECTIVE DATA Goal status: MET, 04/10/2024  5. Patient negotiates ramps & curbs with RW & prosthesis with supervision. Baseline: SEE OBJECTIVE DATA Goal status: MET, 04/10/2024  LONG TERM GOALS: Target date: 08/23/2024  Patient demonstrates & verbalized understanding of prosthetic care to enable safe utilization of prosthesis. Baseline: SEE OBJECTIVE DATA Goal status: MET 05/29/2024  Patient tolerates prosthesis wear >90% of awake hours without skin issues or limb pain >4/10. Baseline: SEE OBJECTIVE DATA Goal status: Ongoing     06/19/2024  BERG >/= 25/56 to indicate lower fall risk Baseline: SEE OBJECTIVE DATA Goal status: GOAL MET, 05/31/2024  Patient ambulates >300' with prosthesis and LRAD independently Baseline: SEE OBJECTIVE DATA Goal status: Ongoing    06/19/2024  Patient negotiates ramps, curbs & stairs with single rail with prosthesis and LRAD independently. Baseline: SEE OBJECTIVE DATA Goal status: Ongoing    06/19/2024  6.  Patient reports Patient-Specific Activity Score improved the average by 3 to indicate improvement in functional activities.   Baseline: SEE OBJECTIVE DATA Goal status: Ongoing  06/19/2024  PLAN:  PT FREQUENCY: 1-2x/week  PT DURATION: 12 weeks , 2x/wk for at least 6 weeks   PLANNED INTERVENTIONS: 97164- PT Re-evaluation, 97750- Physical Performance Testing, 97110-Therapeutic exercises, 97530- Therapeutic  activity, [id number]- Neuromuscular re-education, [phone]- Self Care, [id number]- Manual therapy, [phone]- Gait training, [phone]- Prosthetic Initial , [phone]- Orthotic/Prosthetic subsequent, Patient/Family education, Balance training, Stair training, Joint mobilization, and DME instructions  PLAN FOR NEXT SESSION: do progress note.  check on HEP for balance with green Thera-Band, posture,  gait with cane including carrying items.     Grayce Spatz, PT, DPT 06/21/2024, 1:51 PM    Date of referral: 02/13/2024  Referring provider: Harden Jerona GAILS, MD Referring diagnosis? S10.387 (ICD-10-CM) - Hx of AKA (above knee amputation), left (HCC)  Treatment diagnosis? (if different than referring diagnosis) R26.89, M62.81, M79.605, M25.652, R29.3, R26.81, L98.498  What was this (referring dx) caused by? Other: Lt AKA  Nature of Condition: Chronic (continuous duration > 3 months)   Laterality: Lt  Current Functional Measure Score: Patient Specific Functional Scale Score:4  Objective measurements identify impairments when they are compared to normal values, the uninvolved extremity, and prior level of function.  [x]  Yes  []  No  Objective assessment of functional ability: Moderate functional limitations   Briefly describe symptoms: Patient is dependent in prosthetic care and use with high risk of falls. Gait deviation, muscle weakness, pain in Lt residual limb, hip flexion contracture, heavy reliance on AD  How did symptoms start: amputation due to vascular issues  Average pain intensity:  Last 24 hours: 3-4/10  Past week: 3-7/10  How often does the pt experience symptoms? Constantly  How much have the symptoms interfered with usual daily activities? Quite a bit  How has condition changed since care began at this facility? A little better  In general, how is the patients overall health? Good   BACK PAIN (STarT Back Screening Tool) No

## 2024-06-26 ENCOUNTER — Ambulatory Visit: Admitting: Physical Therapy

## 2024-06-26 ENCOUNTER — Encounter: Payer: Self-pay | Admitting: Physical Therapy

## 2024-06-26 DIAGNOSIS — R2689 Other abnormalities of gait and mobility: Secondary | ICD-10-CM

## 2024-06-26 DIAGNOSIS — M79605 Pain in left leg: Secondary | ICD-10-CM | POA: Diagnosis not present

## 2024-06-26 DIAGNOSIS — M6281 Muscle weakness (generalized): Secondary | ICD-10-CM | POA: Diagnosis not present

## 2024-06-26 DIAGNOSIS — M25652 Stiffness of left hip, not elsewhere classified: Secondary | ICD-10-CM

## 2024-06-26 DIAGNOSIS — R293 Abnormal posture: Secondary | ICD-10-CM

## 2024-06-26 DIAGNOSIS — R2681 Unsteadiness on feet: Secondary | ICD-10-CM

## 2024-06-26 NOTE — Therapy (Signed)
 OUTPATIENT PHYSICAL THERAPY PROSTHETIC TREATMENT & PROGRESS NOTE   Patient Name: Mike Floyd MRN: [id number] DOB:10-15-1960, 63 y.o., male Today's Date: 06/26/2024  Progress Note Reporting Period 04/16/2024 to 06/26/2024  See note below for Objective Data and Assessment of Progress/Goals.   END OF SESSION:  PT End of Session - 06/26/24 1349     Visit Number 20    Number of Visits 40    Date for Recertification  08/23/24    Authorization Type [employer] MEDICARE    Authorization Time Period $45 copay 8 Therapy Visit(s) from 05/31/2024 to 06/28/2024    Authorization - Visit Number 5    Authorization - Number of Visits 8    Progress Note Due on Visit 20    PT Start Time 1345    PT Stop Time 1430    PT Time Calculation (min) 45 min    Equipment Utilized During Treatment Gait belt    Activity Tolerance Patient tolerated treatment well;Patient limited by fatigue    Behavior During Therapy WFL for tasks assessed/performed              Past Medical History:  Diagnosis Date   AICD (automatic cardioverter/defibrillator) present 11/2007   Removed in 2018, a. 11/2007 [facility] Current VR - single lead ICD  - Removed 2018 - it was burning me   Anxiety    CAD (coronary artery disease)    non-obstructive CAD by Cor CT in 2020 // Myoview  3/22: EF 57, small inf-sept defect c/w scar, no ischemia; low risk      Chest pain    a. 10/2007 Cath:  normal Cors.   CKD (chronic kidney disease), stage II    DDD (degenerative disc disease), lumbar    Diabetes mellitus DX: 2010   type 2   Erosive esophagitis    a. per EGD (08/2011), Dr. Golda - Erosive reflux esophagitis improved but not completely healed since previous EGD 3 years ago. Bx showing  ulcerated gatroesophageal junction mucosa. negative for H. pylori   GERD (gastroesophageal reflux disease)    Gout    Hearing deficit    a. wear bilateral hearing aides   History of hiatal hernia    History of kidney stones    passed stones, no surgery    Hypertension    Mildly dilatd aortic root (HCC)    CMR 4/22: EF 52, no LGE; d/w Dr. Ione root 38 mm (mildly dilated)   Myocardial infarction [facility]) 2011   Neuropathy    Feet and legs   Nonischemic dilated cardiomyopathy (HCC)    a. H/O EF as low as 35-40% by LV gram 10/2007;  b. Echo 02/2011 EF 50-55%, inf HK, Gr 1 DD // CMR 4/22: EF 52, no LGE; d/w Dr. Ione root 38 mm (mildly dilated)    Renal insufficiency    Sleep apnea    pt doesnt use, statesI cant afford one. PCP aware   Stroke (HCC)    mini-stroke in 2014   TIA (transient ischemic attack)    July, 2013   Tobacco abuse, in remission 06/27/2009   Discontinued in 2009     Wears dentures    top plate   WPW (Wolff-Parkinson-White syndrome)    a. s/p RFCA @ [facility] - 1999   Past Surgical History:  Procedure Laterality Date   AMPUTATION Left 05/19/2022   Procedure: TRANSMETATARSAL AMPUTATION LEFT FOOT;  Surgeon: Harden Jerona GAILS, MD;  Location: [facility] OR;  Service: Orthopedics;  Laterality: Left;   AMPUTATION Left  06/17/2023   Procedure: LEFT BELOW KNEE AMPUTATION;  Surgeon: Harden Jerona GAILS, MD;  Location: [facility] OR;  Service: Orthopedics;  Laterality: Left;   AMPUTATION Left 08/19/2023   Procedure: LEFT ABOVE KNEE AMPUTATION;  Surgeon: Harden Jerona GAILS, MD;  Location: [facility] OR;  Service: Orthopedics;  Laterality: Left;   APPENDECTOMY     BACK SURGERY     1995   CARDIAC CATHETERIZATION     2009   CARDIAC DEFIBRILLATOR PLACEMENT     ICD was removed in 2018   CHOLECYSTECTOMY     CHOLECYSTECTOMY, LAPAROSCOPIC     11/2007   COLONOSCOPY W/ POLYPECTOMY  2009   ELBOW SURGERY Left 06/2010   ESOPHAGEAL DILATION N/A 11/08/2014   Procedure: ESOPHAGEAL DILATION;  Surgeon: Claudis RAYMOND Rivet, MD;  Location: [facility] ORS;  Service: Endoscopy;  Laterality: N/A;  #56,    ESOPHAGOGASTRODUODENOSCOPY  03/31/2012   also 08/2011; [name]   ESOPHAGOGASTRODUODENOSCOPY (EGD) WITH PROPOFOL  N/A 11/08/2014   Procedure: ESOPHAGOGASTRODUODENOSCOPY (EGD) WITH  PROPOFOL ;  Surgeon: Claudis RAYMOND Rivet, MD;  Location: [facility] ORS;  Service: Endoscopy;  Laterality: N/A;  Hiatus is 11 , GE Junction is 37   FOOT ARTHRODESIS Left 10/24/2020   Procedure: LEFT GASTROCNEMIUS RECESSION, DORSIFLEXION OSTEOTOMY 1ST MT;  Surgeon: Harden Jerona GAILS, MD;  Location: [facility] OR;  Service: Orthopedics;  Laterality: Left;   FOOT ARTHRODESIS Right 01/09/2021   Procedure: CLOSING WEDGE OSTEOTOMY RIGHT 1ST METATARSAL;  Surgeon: Harden Jerona GAILS, MD;  Location: [facility] OR;  Service: Orthopedics;  Laterality: Right;   GASTROCNEMIUS RECESSION Right 01/09/2021   Procedure: RIGHT GASTROCNEMIUS RECESSION;  Surgeon: Harden Jerona GAILS, MD;  Location: [facility] OR;  Service: Orthopedics;  Laterality: Right;   ICD LEAD REMOVAL N/A 10/07/2016   Procedure: ICD LEAD REMOVAL ;  Surgeon: Danelle LELON Birmingham, MD;  Location: [facility] OR;  Service: Cardiovascular;  Laterality: N/A;   MULTIPLE TOOTH EXTRACTIONS     RADIOFREQUENCY ABLATION  1999   WPW; performed at [facility]   STUMP REVISION Left 07/22/2023   Procedure: REVISION LEFT BELOW KNEE AMPUTATION;  Surgeon: Harden Jerona GAILS, MD;  Location: [facility] OR;  Service: Orthopedics;  Laterality: Left;   TEE WITHOUT CARDIOVERSION N/A 10/07/2016   Procedure: TRANSESOPHAGEAL ECHOCARDIOGRAM (TEE);  Surgeon: Danelle LELON Birmingham, MD;  Location: [facility] OR;  Service: Cardiovascular;  Laterality: N/A;   Patient Active Problem List   Diagnosis Date Noted   Dyslipidemia 11/03/2023   Essential hypertension 11/03/2023   Type 2 diabetes mellitus with peripheral neuropathy (HCC) 11/03/2023   BPH (benign prostatic hyperplasia) 11/03/2023   GERD without esophagitis 11/03/2023   S/P AKA (above knee amputation) unilateral, left (HCC) 08/19/2023   Dehiscence of amputation stump of left lower extremity (HCC) 07/22/2023   Ischemia of site of left below knee amputation (HCC) 07/22/2023   S/P BKA (below knee amputation) unilateral, left (HCC) 07/22/2023   Below-knee amputation of left lower extremity (HCC) 06/17/2023    Acute osteomyelitis of metatarsal bone of left foot (HCC)    Subacute osteomyelitis, left ankle and foot (HCC) 05/13/2022   Non-pressure chronic ulcer of other part of right foot limited to breakdown of skin (HCC)    Contracture of right Achilles tendon    Mildly dilatd aortic root (HCC)    Contracture of left Achilles tendon    Metatarsal deformity, left    CAD (coronary artery disease)    Hallux hammertoe, left 12/11/2019   Diarrhea 06/27/2019   Diastasis recti 08/22/2018   Umbilical hernia without obstruction and without gangrene 08/22/2018  Vitamin D  deficiency 06/23/2017   Essential hypertension, benign 06/15/2017   Mixed hyperlipidemia 06/15/2017   Class 1 obesity due to excess calories with serious comorbidity and body mass index (BMI) of 31.0 to 31.9 in adult 06/15/2017   Malfunction of implantable cardioverter-defibrillator (ICD) electrode 10/07/2016   Chronic kidney disease (CKD), stage III (moderate) (HCC) 04/04/2015   Bladder neck obstruction 03/21/2015   Difficult or painful urination 03/21/2015   Flank pain 03/21/2015   Delayed onset of urination 03/21/2015   Calculus of kidney 03/21/2015   Erosive esophagitis 12/27/2013   Hypotension due to drugs 07/02/2013   Chronic systolic heart failure (HCC) 04/24/2013   CAP (community acquired pneumonia) 03/16/2013   HTN (hypertension) 03/15/2013   Hydrocele 03/15/2013   Spermatocele 03/15/2013   DOE (dyspnea on exertion) 03/15/2013   OSA (obstructive sleep apnea) 01/09/2013   Chronic kidney disease, stage 3 (HCC) 01/03/2013   Chest pain 12/26/2012   History of diagnostic tests 09/06/2012   TIA (transient ischemic attack)    Cardiomyopathy, nonischemic (HCC) 02/24/2010   AICD (automatic cardioverter/defibrillator) 02/24/2010   DM type 2 causing vascular disease (HCC) 06/27/2009   Gout 06/27/2009   Tobacco abuse, in remission 06/27/2009   COLONIC POLYPS 10/31/2008   GERD (gastroesophageal reflux disease) 10/31/2008     PCP: Gladis Lauraine BRAVO, NP  REFERRING PROVIDER: Harden Jerona GAILS, MD  REFERRING DIAG: [phone] (ICD-10-CM) - Hx of AKA (above knee amputation), left (HCC)   THERAPY DIAG:  Other abnormalities of gait and mobility  Muscle weakness (generalized)  Stiffness of left hip, not elsewhere classified  Pain in left leg  Abnormal posture  Unsteadiness on feet  Rationale for Evaluation and Treatment: Rehabilitation  ONSET DATE: Prosthesis delivery 02/20/2024  SUBJECTIVE:   SUBJECTIVE STATEMENT:   He almost fell stepping off bottom step of stairs using 2 rails.  He was turning to look at cats.  He was able to catch himself.     PERTINENT HISTORY: DM2, GERD, hypertension, dyslipidemia, CAD, L BKA (06/17/23), neuropathy  Eval: Patient is a 63 year old male who presents to physical therapy with a left AKA (08/19/2023) with a new prosthesis delivery on 02/20/2024. Patient had previous home health PT prior to Lt AKA. He is able to mobilize with modified independence w/ walker. Wears prosthesis about ten hours a day and by the end of the night he takes it off due to fatigue and pain.   PAIN:  Are you having pain? Yes: NPRS scale: 4/10 today, 10/10 at worst since last PT Pain location: Femur in prosthesis and pain in the Rt leg as well Pain description: feels like its been hammered Aggravating factors: keeping prosthesis on for long periods of times, sitting Relieving factors: taking off prosthesis and medications  PRECAUTIONS: Fall  RED FLAGS: None   WEIGHT BEARING RESTRICTIONS: No  FALLS:  Has patient fallen in last 6 months? Yes. Number of falls 2, forgetting he doesn't have a leg and put weight onto the left side. He did not hit the floor, only fell into another person. Occurred when transferring from sitting to standing  LIVING ENVIRONMENT: Lives with: lives with their spouse Lives in: Other Trailer on grass Stairs: Yes: External: 4 steps; can reach both Has following equipment  at home: Single point cane, Environmental Consultant - 2 wheeled, Wheelchair (manual), and shower chair  OCCUPATION: retired   PLOF: Independent with household mobility with device  PATIENT GOALS: ambulating and ADLs with cane   NEXT MD VISIT: Office MD visit: 03/21/2024  OBJECTIVE:  Patient-Specific Activity Scoring Scheme  0 represents "unable to perform." 10 represents "able to perform at prior level. 0 1 2 3 4 5 6 7 8 9  10 (Date and Score)   Activity Eval  06/19/24   1. Walking w/ cane  2 3   2. Standing for ADLs  5  7  3. Stairs 5 7  4.    5.    Score 4 5.66   Total score = sum of the activity scores/number of activities Minimum detectable change (90%CI) for average score = 2 points Minimum detectable change (90%CI) for single activity score = 3 points  DIAGNOSTIC FINDINGS: none post-amputation  COGNITION: Overall cognitive status: Within functional limits for tasks assessed   SENSATION: WFL  MUSCLE LENGTH:   POSTURE: rounded shoulders, forward head, flexed trunk , and weight shift right  LOWER EXTREMITY ROM:  ROM P:passive  A:active Left eval 06/14/2024  Hip flexion    Hip extension Thomas position: P: - 40* P: -27  Hip abduction    Hip adduction    Hip internal rotation    Hip external rotation    Knee flexion    Knee extension    Ankle dorsiflexion    Ankle plantarflexion    Ankle inversion    Ankle eversion     (Blank rows = not tested)  LOWER EXTREMITY MMT:  MMT Right eval Left eval  Hip flexion 3/5 3/5  Hip extension 3/5 3-/5  Hip abduction  3-/5  Hip adduction    Hip internal rotation    Hip external rotation    Knee flexion    Knee extension 3/5   Ankle dorsiflexion    Ankle plantarflexion    Ankle inversion    Ankle eversion    At Evaluation all strength testing is grossly seated and functionally standing / gait. (Blank rows = not tested)  BED MOBILITY:  Sit to supine Complete Independence Supine to sit Complete Independence Rolling  to Right Complete Independence  TRANSFERS: 04/10/2024: Pt able stand to/from sit into chair without armrest using BUEs on seat and RW for stabilization with supervision/verbal cues.  Eval: Sit to stand: supervision requires a stable object to arise, stabilization w/ walker using UE support, with delayed weight shift onto the prosthesis for stabilization, and uses UE to bring Lt prosthesis back below his body  Stand to sit: supervision requires a stable object to arise, stabilization w/ walker using UE support, with delayed weight shift onto the prosthesis for stabilization  FUNCTIONAL TESTs:  06/26/2024:  Timed Up & Go with cane with CGA 33.85sec. Berg Balance [phone]  [facility] PT Assessment - 06/26/24 1345       Berg Balance Test   Sit to Stand Able to stand  independently using hands    Standing Unsupported Able to stand safely 2 minutes    Sitting with Back Unsupported but Feet Supported on Floor or Stool Able to sit safely and securely 2 minutes    Stand to Sit Controls descent by using hands    Transfers Able to transfer safely, minor use of hands    Standing Unsupported with Eyes Closed Able to stand 10 seconds with supervision    Standing Unsupported with Feet Together Able to place feet together independently and stand for 1 minute with supervision    From Standing, Reach Forward with Outstretched Arm Can reach confidently >25 cm (10)    From Standing Position, Pick up Object from Floor Able to pick up shoe,  needs supervision    From Standing Position, Turn to Look Behind Over each Shoulder Turn sideways only but maintains balance    Turn 360 Degrees Needs assistance while turning   needs minA without device for safety.   Standing Unsupported, Alternately Place Feet on Step/Stool Able to complete >2 steps/needs minimal assist    Standing Unsupported, One Foot in Front Able to plae foot ahead of the other independently and hold 30 seconds    Standing on One Leg Tries to lift leg/unable  to hold 3 seconds but remains standing independently    Total Score 38    Berg comment: BERG  < 36 high risk for falls (close to 100%) 46-51 moderate (>50%)   37-45 significant (>80%) 52-55 lower (> 25%)           04/10/2024: Patient able to reach 10 forward and look over his shoulders with single upper extremity support on RW.   Eval:  BERG score 11/56 05/31/2024: performed with standing rest breaks at RW, but performed below tasks without RW   [facility] PT Assessment - 05/31/24 0001       Berg Balance Test   Sit to Stand Able to stand using hands after several tries    Standing Unsupported Able to stand 2 minutes with supervision    Sitting with Back Unsupported but Feet Supported on Floor or Stool Able to sit safely and securely 2 minutes    Stand to Sit Uses backs of legs against chair to control descent    Transfers Able to transfer safely, definite need of hands    Standing Unsupported with Eyes Closed Able to stand 10 seconds with supervision    Standing Unsupported with Feet Together Able to place feet together independently and stand for 1 minute with supervision    From Standing, Reach Forward with Outstretched Arm Reaches forward but needs supervision    From Standing Position, Pick up Object from Floor Able to pick up shoe, needs supervision    From Standing Position, Turn to Look Behind Over each Shoulder Needs supervision when turning    Turn 360 Degrees Needs assistance while turning    Standing Unsupported, Alternately Place Feet on Step/Stool Needs assistance to keep from falling or unable to try    Standing Unsupported, One Foot in Front Able to plae foot ahead of the other independently and hold 30 seconds    Standing on One Leg Tries to lift leg/unable to hold 3 seconds but remains standing independently    Total Score 29    Berg comment: BERG < 36 high risk for falls (close to 100%) 46-51 moderate (>50%) 37-45 significant (>80%) 52-55 lower (> 25%)         BERG:  06/14/2024 31/56 performed with RW in front of patient and intermittently using RW during breaks   GAIT: 04/10/2024: Patient ambulated 125' with RW and supervision.   Patient negotiated 12*ramp and 6.5 curb with RW and supervision  Eval: Gait pattern: step to pattern, decreased step length- Right, decreased stance time- Left, decreased hip/knee flexion- Left, circumduction- Left, Left hip hike, antalgic, trendelenburg, lateral lean- Right, trunk flexed, abducted- Left, and poor foot clearance- Left Distance walked: 62 ft Assistive device utilized: Walker - 2 wheeled and AKA prosthesis Level of assistance: SBA / steady with RW support but needs cues  Gait velocity: 0.72 ft/sec Comments: excessive weightbearing on BUEs using RW   05/31/2024 Gait Speed:  Self-selected: 1.59 ft/sec Fast-as-comfortable: 2.29 ft/sec    RAMP :  not tested  CURB: not tested  CURRENT PROSTHETIC WEAR ASSESSMENT: 06/26/2024:  Pt is wearing prosthesis all awake hours with no skin issues but limb pain 2/10-6/10 normally.   He is independent with donning, doffing, skin check, residual limb care, care of non-amputated limb, prosthetic cleaning, ply sock cleaning, correct ply sock adjustment, proper wear schedule/adjustment, and proper weight-bearing schedule/adjustment  Evaluation:   Patient is dependent with: skin check, residual limb care, care of non-amputated limb, prosthetic cleaning, ply sock cleaning, correct ply sock adjustment, proper wear schedule/adjustment, and proper weight-bearing schedule/adjustment Donning prosthesis: SBA / verbal cues Doffing prosthesis: Complete Independence Prosthetic wear tolerance: 6-8 hours, 1x/day, 7 days/week Prosthetic weight bearing tolerance: tolerated standing for 10 minutes with partial weight on prosthesis during BERG balance test with no increased pain. Although he reports standing pain with activities at home. Edema: minimal Residual limb condition: 3x59mm  superficial wound distally located, scar with mild adherence, no adductor roll, normal skin color, decreased hair growth present Prosthetic description: silicone liner with suction ring suspension, ischial containment socket with flexible inner socket, rotator unit, SAFETY (single axis friction engaging with an extension assist), flexible keel foot  TODAY'S TREATMENT:                                                                                                                    DATE: 06/26/2024 Prosthetic training with transfemoral prosthesis: See prosthetic wear and objective data. Patient continues to have residual limb pain and phantom pain.  PT demo and verbal cues on recommendation to tap prosthetic socket so vibration sense overrides some pain sense.  PT also discussed making a foot to be gemeprost residual limb with fleece pajama bottoms to decrease cold which can facilitate increased pain.  Patient and wife verbalized understanding. sit to/from stand 18 chair without armrests using RUE on seat with cane for stabilization with supervision.   Patient ambulated 39' x 2 with cane stand-alone tip with min/CGA working on performing simple cognitive tasks while ambulating.     Neuromuscular reeducation working on standing balance See objective data for Berg & TUG    TREATMENT:                                                                                                                    DATE: 06/21/2024 Prosthetic training with transfemoral prosthesis: sit to/from stand 18 chair without armrests using RUE on seat with cane for stabilization with supervision.   Patient ambulated 29' x 2 with cane stand-alone tip with min/CGA. 2nd  rep carrying open bottle of water  with no spills but one misstep requiring minA to balance.   PT cues on upright posture, step length and weight shift over prosthesis in stance. Pt neg stairs with single rail & cane (4 steps left rail / RUE cane & 4 steps right  rail / LUE cane) with verbal cues and CGA.    Neuromuscular reeducation working on standing balance Standing balance with upper extremity resistance using green Thera-Band to facilitate core stabilization and weightbearing through both lower extremities.  PT min/CGA with tactile cues and verbal cues for upright posture and balance reactions.  Single upper extremity alternating sides 5 reps each and bilateral upper extremities 10 reps: Row, forward reach and biceps curl/upward reach. PT demo & verbal cues & HO for HEP including safe set-up & use of bands. Pt verbalized understanding after performing during today's session.     TREATMENT:                                                                                                                    DATE: 06/19/2024 Prosthetic training with transfemoral prosthesis: PT reviewed recommendations for prosthetist at appointment tomorrow to perform a dynamic alignment and modify the socket to address pain issues.  Patient verbalized understanding. Patient ambulated 50' x 2 with cane stand-alone tip with min/CGA.  PT cues on upright posture, step length and weight shift over prosthesis in stance. PT demo and verbal cues on negotiating curb with a cane and prosthesis.  Patient negotiated 6.5 curb 2 reps with min to modA.    Neuromuscular reeducation working on standing balance Standing balance with upper extremity resistance using green Thera-Band to facilitate core stabilization and weightbearing through both lower extremities.  PT min/CGA with tactile cues and verbal cues for upright posture and balance reactions.  Single upper extremity alternating sides 5 reps each and bilateral upper extremities 10 reps: Row, forward reach and biceps curl/upward reach.   TREATMENT:                                                                                                                    DATE: 06/14/2024 Prosthetic training with transfemoral  prosthesis: Patient independently doffed prosthesis and silicone liner in order for PT to perform skin check around incision site as well as anterior thigh/femur. PT discussed redness on anterior thigh and palpated leading to increased pain/soreness with visible reaction from patient. PT educated on importance of donning silicone liner appropriately as well as taking valve out while donning  in order to seat the residual limb in the prosthesis without leaving an air bubble. Patient performed donning with verbal cues only. PT then observed the [facility] stretch in order to note any improvements in hip flexor contracture (with results noted above). PT then observed patient perform sit to stand, ambulation for ~30', then stand to sit while discussing importance of scheduling a follow up with prosthetist.   Physical Performance:  Patient performed BERG balance assessment with results being 31/56 and discussed with patient on improvements. However, PT also discussed with patient that he is still a high fall risk for higher level balance tasks without the RW.     HOME EXERCISE PROGRAM: Access Code: [email or web address] URL: [email or web address] Date: 06/21/2024 Prepared by: Grayce Spatz  Exercises - Modified Thomas Stretch  - 2 x daily - 7 x weekly - 1 sets - 2-3 reps - 30 seconds hold - Hip Flexor Stretch at Edge of Bed  - 2 x daily - 7 x weekly - 1 sets - 2-3 reps - 30 seconds hold - Standing posture with back to counter  - 2 x daily - 7 x weekly - 1 sets - 1 reps - 5-10 minutes hold - Upright Stance at Door Frame Single Arm  - 3-6 x daily - 7 x weekly - 1 sets - 2 reps - 2 deep breathes hold - Upright Stance at Door Frame with Both Arms  - 3-6 x daily - 7 x weekly - 1 sets - 2 reps - 2 deep breathes hold - Alternating Punch with Resistance  - 1 x daily - 4-5 x weekly - 1 sets - 10 reps - 5 seconds hold - Standing Scapular Protraction with Resistance  - 1 x daily - 4-5 x weekly - 1 sets - 10  reps - 5 seconds hold - Standing alternate rows with resistance  - 1 x daily - 4-5 x weekly - 1 sets - 10 reps - 5 seconds hold - Standing Row with Anchored Resistance  - 1 x daily - 4-5 x weekly - 1 sets - 10 reps - 5 seconds hold - Alternating elbow flexion with resistance  - 1 x daily - 4-5 x weekly - 1 sets - 10 reps - 5 seconds hold - Standing Bicep Curls with Resistance  - 1 x daily - 4-5 x weekly - 1 sets - 10 reps - 5 seconds hold  Do each exercise 1-2  times per day Do each exercise 5-10 repetitions Hold each exercise for 2 seconds to feel your location  AT SINK FIND YOUR MIDLINE POSITION AND PLACE FEET EQUAL DISTANCE FROM THE MIDLINE.  Try to find this position when standing still for activities.   USE TAPE ON FLOOR TO MARK THE MIDLINE POSITION which is even with middle of sink.  You also should try to feel with your limb pressure in socket.  You are trying to feel with limb what you used to feel with the bottom of your foot.  Side to Side Shift: Moving your hips only (not shoulders): move weight onto your left leg, HOLD/FEEL pressure in socket.  Move back to equal weight on each leg, HOLD/FEEL pressure in socket. Move weight onto your right leg, HOLD/FEEL pressure in socket. Move back to equal weight on each leg, HOLD/FEEL pressure in socket. Repeat.  Start with both hands on sink, progress to hand on prosthetic side only, then no hands.  Front to Back Shift: Moving your hips only (not shoulders): move your  weight forward onto your toes, HOLD/FEEL pressure in socket. Move your weight back to equal Flat Foot on both legs, HOLD/FEEL  pressure in socket. Move your weight back onto your heels, HOLD/FEEL  pressure in socket. Move your weight back to equal on both legs, HOLD/FEEL  pressure in socket. Repeat.  Start with both hands on sink, progress to hand on prosthetic side only, then no hands.  Moving Cones / Cups: With equal weight on each leg: Hold on with one hand the first time, then  progress to no hand supports. Move cups from one side of sink to the other. Place cups ~2" out of your reach, progress to 10" beyond reach.  Place one hand in middle of sink and reach with other hand. Do both arms.  Then hover one hand and move cups with other hand.  Overhead/Upward Reaching: alternated reaching up to top cabinets or ceiling if no cabinets present. Keep equal weight on each leg. Start with one hand support on counter while other hand reaches and progress to no hand support with reaching.  ace one hand in middle of sink and reach with other hand. Do both arms.  Then hover one hand and move cups with other hand.  5.   Looking Over Shoulders: With equal weight on each leg: alternate turning to look over your shoulders with one hand support on counter as needed.  Start with head motions only to look in front of shoulder, then even with shoulder and progress to looking behind you. To look to side, move head /eyes, then shoulder on side looking pulls back, shift more weight to side looking and pull hip back. Place one hand in middle of sink and let go with other hand so your shoulder can pull back. Switch hands to look other way.   Then hover one hand and look over shoulder. If looking right, use left hand at sink. If looking left, use right hand at sink. 6.  Stepping with leg that is not amputated:  Move items under cabinet out of your way. Shift your hips/pelvis so weight on prosthesis. Tighten muscles in hip on prosthetic side.  SLOWLY step other leg so front of foot is in cabinet. Then step back to floor.    ASSESSMENT:  CLINICAL IMPRESSION:    Patient's Berg balance has improved but continues to put him at 5 risk of falls.  Patient continues to benefit from skilled physical therapy.  He has made significant progress with therapy but needs further therapy to maximize his function and decrease falls.  [employer] Medicare has limited visits and will need to have a reauthorization next  session.  OBJECTIVE IMPAIRMENTS: Abnormal gait, decreased activity tolerance, decreased balance, decreased endurance, decreased knowledge of condition, decreased knowledge of use of DME, decreased mobility, difficulty walking, decreased ROM, decreased strength, impaired flexibility, postural dysfunction, prosthetic dependency , and pain.   ACTIVITY LIMITATIONS: carrying, lifting, bending, sitting, standing, squatting, stairs, transfers, bathing, toileting, dressing, reach over head, and locomotion level  PARTICIPATION LIMITATIONS: meal prep, cleaning, laundry, driving, shopping, community activity, and yard work  PERSONAL FACTORS: Age, Fitness, Past/current experiences, Time since onset of injury/illness/exacerbation, and 3+ comorbidities: see PMH are also affecting patient's functional outcome.   REHAB POTENTIAL: Good  CLINICAL DECISION MAKING: Evolving/moderate complexity  EVALUATION COMPLEXITY: Moderate   GOALS: Goals reviewed with patient? Yes  SHORT TERM GOALS: Target date: 04/05/2024  Patient donnes prosthesis modified independent & verbalizes proper cleaning. Baseline: SEE OBJECTIVE DATA Goal status: MET, 04/10/2024  2.  Patient tolerates prosthesis >12 hrs total /day without skin issues or limb pain >7/10 after standing. Baseline: patient reports wearing 4-5 hr/day Goal status: GOAL MET, 70/23/2025  3.  Patient able to reach 7 and look over both shoulders with RW support with Modified independent. Baseline: SEE OBJECTIVE DATA Goal status: MET, 04/10/2024  4. Patient ambulates 125' with RW & prosthesis with supervision. Baseline: SEE OBJECTIVE DATA Goal status: MET, 04/10/2024  5. Patient negotiates ramps & curbs with RW & prosthesis with supervision. Baseline: SEE OBJECTIVE DATA Goal status: MET, 04/10/2024  LONG TERM GOALS: Target date: 08/23/2024  Patient demonstrates & verbalized understanding of prosthetic care to enable safe utilization of prosthesis. Baseline: SEE  OBJECTIVE DATA Goal status: MET 05/29/2024  Patient tolerates prosthesis wear >90% of awake hours without skin issues or limb pain >4/10. Baseline: SEE OBJECTIVE DATA Goal status: Ongoing     06/26/2024  BERG >/= 25/56 to indicate lower fall risk Baseline: SEE OBJECTIVE DATA Goal status: GOAL MET, 05/31/2024  Patient ambulates >300' with prosthesis and LRAD independently Baseline: SEE OBJECTIVE DATA Goal status: Ongoing    06/26/2024  Patient negotiates ramps, curbs & stairs with single rail with prosthesis and LRAD independently. Baseline: SEE OBJECTIVE DATA Goal status: Ongoing    06/26/2024  6.  Patient reports Patient-Specific Activity Score improved the average by 3 to indicate improvement in functional activities.   Baseline: SEE OBJECTIVE DATA Goal status: Ongoing  06/26/2024  PLAN:  PT FREQUENCY: 1-2x/week  PT DURATION: 12 weeks , 2x/wk for at least 6 weeks   PLANNED INTERVENTIONS: 97164- PT Re-evaluation, 97750- Physical Performance Testing, 97110-Therapeutic exercises, 97530- Therapeutic activity, 97112- Neuromuscular re-education, 97535- Self Care, [id number]- Manual therapy, [phone]- Gait training, [phone]- Prosthetic Initial , [phone]- Orthotic/Prosthetic subsequent, Patient/Family education, Balance training, Stair training, Joint mobilization, and DME instructions  PLAN FOR NEXT SESSION: [facility] Medicare reauthorization.  Check on HEP for balance with green Thera-Band, posture, gait with cane including carrying items.     Grayce Spatz, PT, DPT 06/26/2024, 4:09 PM    Date of referral: 02/13/2024  Referring provider: Harden Jerona GAILS, MD Referring diagnosis? S10.387 (ICD-10-CM) - Hx of AKA (above knee amputation), left (HCC)  Treatment diagnosis? (if different than referring diagnosis) R26.89, M62.81, M79.605, M25.652, R29.3, R26.81, L98.498  What was this (referring dx) caused by? Other: Lt AKA  Nature of Condition: Chronic (continuous duration > 3 months)   Laterality:  Lt  Current Functional Measure Score: Patient Specific Functional Scale Score:4  Objective measurements identify impairments when they are compared to normal values, the uninvolved extremity, and prior level of function.  [x]  Yes  []  No  Objective assessment of functional ability: Moderate functional limitations   Briefly describe symptoms: Patient is dependent in prosthetic care and use with high risk of falls. Gait deviation, muscle weakness, pain in Lt residual limb, hip flexion contracture, heavy reliance on AD  How did symptoms start: amputation due to vascular issues  Average pain intensity:  Last 24 hours: 3-4/10  Past week: 3-7/10  How often does the pt experience symptoms? Constantly  How much have the symptoms interfered with usual daily activities? Quite a bit  How has condition changed since care began at this facility? A little better  In general, how is the patients overall health? Good   BACK PAIN (STarT Back Screening Tool) No

## 2024-06-28 ENCOUNTER — Ambulatory Visit: Admitting: Physical Therapy

## 2024-06-28 ENCOUNTER — Encounter: Payer: Self-pay | Admitting: Physical Therapy

## 2024-06-28 DIAGNOSIS — M25652 Stiffness of left hip, not elsewhere classified: Secondary | ICD-10-CM

## 2024-06-28 DIAGNOSIS — R2689 Other abnormalities of gait and mobility: Secondary | ICD-10-CM | POA: Diagnosis not present

## 2024-06-28 DIAGNOSIS — R293 Abnormal posture: Secondary | ICD-10-CM

## 2024-06-28 DIAGNOSIS — M6281 Muscle weakness (generalized): Secondary | ICD-10-CM | POA: Diagnosis not present

## 2024-06-28 DIAGNOSIS — M79605 Pain in left leg: Secondary | ICD-10-CM | POA: Diagnosis not present

## 2024-06-28 DIAGNOSIS — R2681 Unsteadiness on feet: Secondary | ICD-10-CM

## 2024-06-28 NOTE — Therapy (Signed)
 OUTPATIENT PHYSICAL THERAPY PROSTHETIC TREATMENT   Patient Name: Mike Floyd MRN: [id number] DOB:November 25, 1960, 63 y.o., male Today's Date: 06/28/2024  END OF SESSION:  PT End of Session - 06/28/24 1259     Visit Number 21    Number of Visits 40    Date for Recertification  08/23/24    Authorization Type [employer] MEDICARE    Authorization Time Period $45 copay 8 Therapy Visit(s) from 05/31/2024 to 06/28/2024    Authorization - Visit Number 6    Authorization - Number of Visits 8    Progress Note Due on Visit 20    PT Start Time 1259    PT Stop Time 1349    PT Time Calculation (min) 50 min    Equipment Utilized During Treatment Gait belt    Activity Tolerance Patient tolerated treatment well;Patient limited by fatigue    Behavior During Therapy [facility] for tasks assessed/performed              Past Medical History:  Diagnosis Date   AICD (automatic cardioverter/defibrillator) present 11/2007   Removed in 2018, a. 11/2007 S[facility] Current VR - single lead ICD  - Removed 2018 - it was burning me   Anxiety    CAD (coronary artery disease)    non-obstructive CAD by Cor CT in 2020 // Myoview  3/22: EF 57, small inf-sept defect c/w scar, no ischemia; low risk      Chest pain    a. 10/2007 Cath:  normal Cors.   CKD (chronic kidney disease), stage II    DDD (degenerative disc disease), lumbar    Diabetes mellitus DX: 2010   type 2   Erosive esophagitis    a. per EGD (08/2011), Dr. Golda - Erosive reflux esophagitis improved but not completely healed since previous EGD 3 years ago. Bx showing  ulcerated gatroesophageal junction mucosa. negative for H. pylori   GERD (gastroesophageal reflux disease)    Gout    Hearing deficit    a. wear bilateral hearing aides   History of hiatal hernia    History of kidney stones    passed stones, no surgery   Hypertension    Mildly dilatd aortic root (HCC)    CMR 4/22: EF 52, no LGE; d/w Dr. Ione root 38 mm (mildly dilated)   Myocardial  infarction [facility]) 2011   Neuropathy    Feet and legs   Nonischemic dilated cardiomyopathy (HCC)    a. H/O EF as low as 35-40% by LV gram 10/2007;  b. Echo 02/2011 EF 50-55%, inf HK, Gr 1 DD // CMR 4/22: EF 52, no LGE; d/w Dr. Ione root 38 mm (mildly dilated)    Renal insufficiency    Sleep apnea    pt doesnt use, statesI cant afford one. PCP aware   Stroke (HCC)    mini-stroke in 2014   TIA (transient ischemic attack)    July, 2013   Tobacco abuse, in remission 06/27/2009   Discontinued in 2009     Wears dentures    top plate   WPW (Wolff-Parkinson-White syndrome)    a. s/p RFCA @ [facility] - 1999   Past Surgical History:  Procedure Laterality Date   AMPUTATION Left 05/19/2022   Procedure: TRANSMETATARSAL AMPUTATION LEFT FOOT;  Surgeon: Harden Jerona GAILS, MD;  Location: [facility] OR;  Service: Orthopedics;  Laterality: Left;   AMPUTATION Left 06/17/2023   Procedure: LEFT BELOW KNEE AMPUTATION;  Surgeon: Harden Jerona GAILS, MD;  Location: [facility] OR;  Service: Orthopedics;  Laterality:  Left;   AMPUTATION Left 08/19/2023   Procedure: LEFT ABOVE KNEE AMPUTATION;  Surgeon: Harden Jerona GAILS, MD;  Location: [facility] OR;  Service: Orthopedics;  Laterality: Left;   APPENDECTOMY     BACK SURGERY     1995   CARDIAC CATHETERIZATION     2009   CARDIAC DEFIBRILLATOR PLACEMENT     ICD was removed in 2018   CHOLECYSTECTOMY     CHOLECYSTECTOMY, LAPAROSCOPIC     11/2007   COLONOSCOPY W/ POLYPECTOMY  2009   ELBOW SURGERY Left 06/2010   ESOPHAGEAL DILATION N/A 11/08/2014   Procedure: ESOPHAGEAL DILATION;  Surgeon: Claudis RAYMOND Rivet, MD;  Location: [facility] ORS;  Service: Endoscopy;  Laterality: N/A;  #56,    ESOPHAGOGASTRODUODENOSCOPY  03/31/2012   also 08/2011; [name]   ESOPHAGOGASTRODUODENOSCOPY (EGD) WITH PROPOFOL  N/A 11/08/2014   Procedure: ESOPHAGOGASTRODUODENOSCOPY (EGD) WITH PROPOFOL ;  Surgeon: Claudis RAYMOND Rivet, MD;  Location: [facility] ORS;  Service: Endoscopy;  Laterality: N/A;  [place] is 62 , [place] is 37   FOOT  ARTHRODESIS Left 10/24/2020   Procedure: LEFT GASTROCNEMIUS RECESSION, DORSIFLEXION OSTEOTOMY 1ST MT;  Surgeon: Harden Jerona GAILS, MD;  Location: [facility] OR;  Service: Orthopedics;  Laterality: Left;   FOOT ARTHRODESIS Right 01/09/2021   Procedure: CLOSING WEDGE OSTEOTOMY RIGHT 1ST METATARSAL;  Surgeon: Harden Jerona GAILS, MD;  Location: [facility] OR;  Service: Orthopedics;  Laterality: Right;   GASTROCNEMIUS RECESSION Right 01/09/2021   Procedure: RIGHT GASTROCNEMIUS RECESSION;  Surgeon: Harden Jerona GAILS, MD;  Location: [facility] OR;  Service: Orthopedics;  Laterality: Right;   ICD LEAD REMOVAL N/A 10/07/2016   Procedure: ICD LEAD REMOVAL ;  Surgeon: Danelle LELON Birmingham, MD;  Location: [facility] OR;  Service: Cardiovascular;  Laterality: N/A;   MULTIPLE TOOTH EXTRACTIONS     RADIOFREQUENCY ABLATION  1999   WPW; performed at [facility]   STUMP REVISION Left 07/22/2023   Procedure: REVISION LEFT BELOW KNEE AMPUTATION;  Surgeon: Harden Jerona GAILS, MD;  Location: [facility] OR;  Service: Orthopedics;  Laterality: Left;   TEE WITHOUT CARDIOVERSION N/A 10/07/2016   Procedure: TRANSESOPHAGEAL ECHOCARDIOGRAM (TEE);  Surgeon: Danelle LELON Birmingham, MD;  Location: [facility] OR;  Service: Cardiovascular;  Laterality: N/A;   Patient Active Problem List   Diagnosis Date Noted   Dyslipidemia 11/03/2023   Essential hypertension 11/03/2023   Type 2 diabetes mellitus with peripheral neuropathy (HCC) 11/03/2023   BPH (benign prostatic hyperplasia) 11/03/2023   GERD without esophagitis 11/03/2023   S/P AKA (above knee amputation) unilateral, left (HCC) 08/19/2023   Dehiscence of amputation stump of left lower extremity (HCC) 07/22/2023   Ischemia of site of left below knee amputation (HCC) 07/22/2023   S/P BKA (below knee amputation) unilateral, left (HCC) 07/22/2023   Below-knee amputation of left lower extremity (HCC) 06/17/2023   Acute osteomyelitis of metatarsal bone of left foot (HCC)    Subacute osteomyelitis, left ankle and foot (HCC) 05/13/2022   Non-pressure  chronic ulcer of other part of right foot limited to breakdown of skin (HCC)    Contracture of right Achilles tendon    Mildly dilatd aortic root (HCC)    Contracture of left Achilles tendon    Metatarsal deformity, left    CAD (coronary artery disease)    Hallux hammertoe, left 12/11/2019   Diarrhea 06/27/2019   Diastasis recti 08/22/2018   Umbilical hernia without obstruction and without gangrene 08/22/2018   Vitamin D  deficiency 06/23/2017   Essential hypertension, benign 06/15/2017   Mixed hyperlipidemia 06/15/2017   Class 1 obesity due  to excess calories with serious comorbidity and body mass index (BMI) of 31.0 to 31.9 in adult 06/15/2017   Malfunction of implantable cardioverter-defibrillator (ICD) electrode 10/07/2016   Chronic kidney disease (CKD), stage III (moderate) (HCC) 04/04/2015   Bladder neck obstruction 03/21/2015   Difficult or painful urination 03/21/2015   Flank pain 03/21/2015   Delayed onset of urination 03/21/2015   Calculus of kidney 03/21/2015   Erosive esophagitis 12/27/2013   Hypotension due to drugs 07/02/2013   Chronic systolic heart failure (HCC) 04/24/2013   CAP (community acquired pneumonia) 03/16/2013   HTN (hypertension) 03/15/2013   Hydrocele 03/15/2013   Spermatocele 03/15/2013   DOE (dyspnea on exertion) 03/15/2013   OSA (obstructive sleep apnea) 01/09/2013   Chronic kidney disease, stage 3 (HCC) 01/03/2013   Chest pain 12/26/2012   History of diagnostic tests 09/06/2012   TIA (transient ischemic attack)    Cardiomyopathy, nonischemic (HCC) 02/24/2010   AICD (automatic cardioverter/defibrillator) 02/24/2010   DM type 2 causing vascular disease (HCC) 06/27/2009   Gout 06/27/2009   Tobacco abuse, in remission 06/27/2009   COLONIC POLYPS 10/31/2008   GERD (gastroesophageal reflux disease) 10/31/2008    PCP: Gladis Lauraine BRAVO, NP  REFERRING PROVIDER: Harden Jerona GAILS, MD  REFERRING DIAG: [phone] (ICD-10-CM) - Hx of AKA (above knee  amputation), left (HCC)   THERAPY DIAG:  Other abnormalities of gait and mobility  Muscle weakness (generalized)  Stiffness of left hip, not elsewhere classified  Pain in left leg  Abnormal posture  Unsteadiness on feet  Rationale for Evaluation and Treatment: Rehabilitation  ONSET DATE: Prosthesis delivery 02/20/2024  SUBJECTIVE:   SUBJECTIVE STATEMENT:   He feels like he is making good progress with PT but thinks he needs more PT to improve more. He wants PT to ask for more visits.     PERTINENT HISTORY: DM2, GERD, hypertension, dyslipidemia, CAD, L BKA (06/17/23), neuropathy  Eval: Patient is a 63 year old male who presents to physical therapy with a left AKA (08/19/2023) with a new prosthesis delivery on 02/20/2024. Patient had previous home health PT prior to Lt AKA. He is able to mobilize with modified independence w/ walker. Wears prosthesis about ten hours a day and by the end of the night he takes it off due to fatigue and pain.   PAIN:  Are you having pain? Yes: NPRS scale: 3/10 today, 6/10 at worst since last PT Pain location: Femur in prosthesis and pain in the Rt leg as well Pain description: feels like its been hammered Aggravating factors: keeping prosthesis on for long periods of times, sitting Relieving factors: taking off prosthesis and medications  PRECAUTIONS: Fall  RED FLAGS: None   WEIGHT BEARING RESTRICTIONS: No  FALLS:  Has patient fallen in last 6 months? Yes. Number of falls 2, forgetting he doesn't have a leg and put weight onto the left side. He did not hit the floor, only fell into another person. Occurred when transferring from sitting to standing  LIVING ENVIRONMENT: Lives with: lives with their spouse Lives in: Other Trailer on grass Stairs: Yes: External: 4 steps; can reach both Has following equipment at home: Single point cane, Environmental Consultant - 2 wheeled, Wheelchair (manual), and shower chair  OCCUPATION: retired   PLOF: Independent with  household mobility with device  PATIENT GOALS: ambulating and ADLs with cane   NEXT MD VISIT: Office MD visit: 03/21/2024   OBJECTIVE:  Patient-Specific Activity Scoring Scheme  0 represents "unable to perform." 10 represents "able to perform at prior level. 0  1 2 3 4 5 6 7 8 9 10  (Date and Score)   Activity Eval  06/19/24   1. Walking w/ cane  2 3   2. Standing for ADLs  5  7  3. Stairs 5 7  4.    5.    Score 4 5.66   Total score = sum of the activity scores/number of activities Minimum detectable change (90%CI) for average score = 2 points Minimum detectable change (90%CI) for single activity score = 3 points  DIAGNOSTIC FINDINGS: none post-amputation  COGNITION: Overall cognitive status: Within functional limits for tasks assessed   SENSATION: WFL  MUSCLE LENGTH:   POSTURE: rounded shoulders, forward head, flexed trunk , and weight shift right  LOWER EXTREMITY ROM:  ROM P:passive  A:active Left eval 06/14/24 06/28/24  Hip flexion     Hip extension Thomas position: P: - 40* P: -27 Supine Thomas Left/right -22*/-17*  Hip abduction     Hip adduction     Hip internal rotation     Hip external rotation     Knee flexion     Knee extension     Ankle dorsiflexion     Ankle plantarflexion     Ankle inversion     Ankle eversion      (Blank rows = not tested)  LOWER EXTREMITY MMT:  MMT Right eval Left eval Right 06/28/24 Left 06/28/24  Hip flexion 3/5 3/5 4/5 4-/5  Hip extension 3/5 3-/5 4/5 4-/5  Hip abduction  3-/5  4-/5  Hip adduction      Hip internal rotation      Hip external rotation      Knee flexion      Knee extension 3/5  4/5   Ankle dorsiflexion      Ankle plantarflexion      Ankle inversion      Ankle eversion      At Evaluation all strength testing is grossly seated and functionally standing / gait. (Blank rows = not tested)  BED MOBILITY:  Evaluation on 03/05/2024 Sit to supine Complete Independence Supine to sit Complete  Independence Rolling to Right Complete Independence  TRANSFERS: 06/28/2024:  sit to/from stand 18 chair with armrests to cane stand alone tip safely.  04/10/2024: Pt able stand to/from sit into chair without armrest using BUEs on seat and RW for stabilization with supervision/verbal cues.  Eval: Sit to stand: supervision requires a stable object to arise, stabilization w/ walker using UE support, with delayed weight shift onto the prosthesis for stabilization, and uses UE to bring Lt prosthesis back below his body  Stand to sit: supervision requires a stable object to arise, stabilization w/ walker using UE support, with delayed weight shift onto the prosthesis for stabilization  FUNCTIONAL TESTs:  06/26/2024:  Timed Up & Go with cane with CGA 33.85sec. Berg Balance [phone]  [facility] PT Assessment - 06/26/24 1345       Berg Balance Test   Sit to Stand Able to stand  independently using hands    Standing Unsupported Able to stand safely 2 minutes    Sitting with Back Unsupported but Feet Supported on Floor or Stool Able to sit safely and securely 2 minutes    Stand to Sit Controls descent by using hands    Transfers Able to transfer safely, minor use of hands    Standing Unsupported with Eyes Closed Able to stand 10 seconds with supervision    Standing Unsupported with Feet Together Able to place  feet together independently and stand for 1 minute with supervision    From Standing, Reach Forward with Outstretched Arm Can reach confidently >25 cm (10)    From Standing Position, Pick up Object from Floor Able to pick up shoe, needs supervision    From Standing Position, Turn to Look Behind Over each Shoulder Turn sideways only but maintains balance    Turn 360 Degrees Needs assistance while turning   needs minA without device for safety.   Standing Unsupported, Alternately Place Feet on Step/Stool Able to complete >2 steps/needs minimal assist    Standing Unsupported, One Foot in Front Able to  plae foot ahead of the other independently and hold 30 seconds    Standing on One Leg Tries to lift leg/unable to hold 3 seconds but remains standing independently    Total Score 38    Berg comment: BERG  < 36 high risk for falls (close to 100%) 46-51 moderate (>50%)   37-45 significant (>80%) 52-55 lower (> 25%)         BERG: 06/14/2024 31/56 performed with RW in front of patient and intermittently using RW during breaks   04/10/2024: Patient able to reach 10 forward and look over his shoulders with single upper extremity support on RW.   Eval:  BERG score 11/56 05/31/2024: performed with standing rest breaks at RW, but performed below tasks without RW   [facility] PT Assessment - 05/31/24 0001       Berg Balance Test   Sit to Stand Able to stand using hands after several tries    Standing Unsupported Able to stand 2 minutes with supervision    Sitting with Back Unsupported but Feet Supported on Floor or Stool Able to sit safely and securely 2 minutes    Stand to Sit Uses backs of legs against chair to control descent    Transfers Able to transfer safely, definite need of hands    Standing Unsupported with Eyes Closed Able to stand 10 seconds with supervision    Standing Unsupported with Feet Together Able to place feet together independently and stand for 1 minute with supervision    From Standing, Reach Forward with Outstretched Arm Reaches forward but needs supervision    From Standing Position, Pick up Object from Floor Able to pick up shoe, needs supervision    From Standing Position, Turn to Look Behind Over each Shoulder Needs supervision when turning    Turn 360 Degrees Needs assistance while turning    Standing Unsupported, Alternately Place Feet on Step/Stool Needs assistance to keep from falling or unable to try    Standing Unsupported, One Foot in Front Able to plae foot ahead of the other independently and hold 30 seconds    Standing on One Leg Tries to lift leg/unable to hold 3  seconds but remains standing independently    Total Score 29    Berg comment: BERG < 36 high risk for falls (close to 100%) 46-51 moderate (>50%) 37-45 significant (>80%) 52-55 lower (> 25%)         CARDIOVASCULAR RESPONSE: Functional activity: gait with RW & cane noted below Pre-activity vitals: HR: 94 SpO2: 98% Post-activity vitals: HR: 107 (took only 1 min to recover to baseline) SpO2: 96%   GAIT: 06/28/2024: Patient amb >300' with RW & prosthesis modified independent. Gait velocity with RW: self-selected pace 1.64 ft/sec & fast pace 2.29 ft/sec Patient neg ramp & curb with RW & prosthesis modified independent.  Patient is now accessing  community with RW & prosthesis instead of w/c.   Pt amb 150' with cane stand alone tip & AKA prosthesis with min/CGA.  Gait velocity with cane: self-selected pace 1.44 ft/sec & fast pace 2.02 ft/sec Pt neg ramp & curb with cane stand alone tip & AKA prosthesis with minA & cues.   05/31/2024 Gait Speed:  Self-selected: 1.59 ft/sec Fast-as-comfortable: 2.29 ft/sec    04/10/2024: Patient ambulated 125' with RW and supervision.   Patient negotiated 12*ramp and 6.5 curb with RW and supervision  Eval: Gait pattern: step to pattern, decreased step length- Right, decreased stance time- Left, decreased hip/knee flexion- Left, circumduction- Left, Left hip hike, antalgic, trendelenburg, lateral lean- Right, trunk flexed, abducted- Left, and poor foot clearance- Left Distance walked: 62 ft Assistive device utilized: Walker - 2 wheeled and AKA prosthesis Level of assistance: SBA / steady with RW support but needs cues  Gait velocity: 0.72 ft/sec Comments: excessive weightbearing on BUEs using RW   CURRENT PROSTHETIC WEAR ASSESSMENT: 06/26/2024:  Pt is wearing prosthesis all awake hours with no skin issues but limb pain 2/10-6/10 normally.   He is independent with donning, doffing, skin check, residual limb care, care of non-amputated limb,  prosthetic cleaning, ply sock cleaning, correct ply sock adjustment, proper wear schedule/adjustment, and proper weight-bearing schedule/adjustment  Evaluation:   Patient is dependent with: skin check, residual limb care, care of non-amputated limb, prosthetic cleaning, ply sock cleaning, correct ply sock adjustment, proper wear schedule/adjustment, and proper weight-bearing schedule/adjustment Donning prosthesis: SBA / verbal cues Doffing prosthesis: Complete Independence Prosthetic wear tolerance: 6-8 hours, 1x/day, 7 days/week Prosthetic weight bearing tolerance: tolerated standing for 10 minutes with partial weight on prosthesis during BERG balance test with no increased pain. Although he reports standing pain with activities at home. Edema: minimal Residual limb condition: 3x18mm superficial wound distally located, scar with mild adherence, no adductor roll, normal skin color, decreased hair growth present Prosthetic description: silicone liner with suction ring suspension, ischial containment socket with flexible inner socket, rotator unit, SAFETY (single axis friction engaging with an extension assist), flexible keel foot  TODAY'S TREATMENT:                                                                                                                    DATE: 06/26/2024 Prosthetic training with transfemoral prosthesis: See gait in objective data Pt amb 120' with cane stand alone tip scanning right/left and up/down with minA. PT demo & verbal cues on stepping over obstacle.  Pt stepped over 3 foam roll with cane stand alone tip with mod/HHA initial 2 reps, then modA / no HHA 2 reps. PT demo & verbal cues how to pick up object from floor using prosthesis for stability.  Pt able to pick up item 3 reps with minA & cues.  Pt able to pick up 3 reps with RW with supervision. PT discussed high risk of injury with performing activities on ladder.  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  Pt verbalized understanding.       TREATMENT:                                                                                                                    DATE: 06/21/2024 Prosthetic training with transfemoral prosthesis: sit to/from stand 18 chair without armrests using RUE on seat with cane for stabilization with supervision.   Patient ambulated 52' x 2 with cane stand-alone tip with min/CGA. 2nd rep carrying open bottle of water  with no spills but one misstep requiring minA to balance.   PT cues on upright posture, step length and weight shift over prosthesis in stance. Pt neg stairs with single rail & cane (4 steps left rail / RUE cane & 4 steps right rail / LUE cane) with verbal cues and CGA.    Neuromuscular reeducation working on standing balance Standing balance with upper extremity resistance using green Thera-Band to facilitate core stabilization and weightbearing through both lower extremities.  PT min/CGA with tactile cues and verbal cues for upright posture and balance reactions.  Single upper extremity alternating sides 5 reps each and bilateral upper extremities 10 reps: Row, forward reach and biceps curl/upward reach. PT demo & verbal cues & HO for HEP including safe set-up & use of bands. Pt verbalized understanding after performing during today's session.     TREATMENT:                                                                                                                    DATE: 06/19/2024 Prosthetic training with transfemoral prosthesis: PT reviewed recommendations for prosthetist at appointment tomorrow to perform a dynamic alignment and modify the socket to address pain issues.  Patient verbalized understanding. Patient ambulated 50' x 2 with cane stand-alone tip with min/CGA.  PT cues on upright posture, step length and weight shift over prosthesis in stance. PT demo and verbal cues on negotiating curb with a  cane and prosthesis.  Patient negotiated 6.5 curb 2 reps with min to modA.    Neuromuscular reeducation working on standing balance Standing balance with upper extremity resistance using green Thera-Band to facilitate core stabilization and weightbearing through both lower extremities.  PT min/CGA with tactile cues and verbal cues for upright posture and balance reactions.  Single upper extremity alternating sides 5 reps each and bilateral upper extremities 10 reps: Row, forward reach and biceps curl/upward reach.   TREATMENT:  DATE: 06/14/2024 Prosthetic training with transfemoral prosthesis: Patient independently doffed prosthesis and silicone liner in order for PT to perform skin check around incision site as well as anterior thigh/femur. PT discussed redness on anterior thigh and palpated leading to increased pain/soreness with visible reaction from patient. PT educated on importance of donning silicone liner appropriately as well as taking valve out while donning in order to seat the residual limb in the prosthesis without leaving an air bubble. Patient performed donning with verbal cues only. PT then observed the [facility] stretch in order to note any improvements in hip flexor contracture (with results noted above). PT then observed patient perform sit to stand, ambulation for ~30', then stand to sit while discussing importance of scheduling a follow up with prosthetist.   Physical Performance:  Patient performed BERG balance assessment with results being 31/56 and discussed with patient on improvements. However, PT also discussed with patient that he is still a high fall risk for higher level balance tasks without the RW.     HOME EXERCISE PROGRAM: Access Code: [email or web address] URL: [email or web address] Date: 06/21/2024 Prepared by: Grayce Spatz  Exercises -  Modified Thomas Stretch  - 2 x daily - 7 x weekly - 1 sets - 2-3 reps - 30 seconds hold - Hip Flexor Stretch at Edge of Bed  - 2 x daily - 7 x weekly - 1 sets - 2-3 reps - 30 seconds hold - Standing posture with back to counter  - 2 x daily - 7 x weekly - 1 sets - 1 reps - 5-10 minutes hold - Upright Stance at Door Frame Single Arm  - 3-6 x daily - 7 x weekly - 1 sets - 2 reps - 2 deep breathes hold - Upright Stance at Door Frame with Both Arms  - 3-6 x daily - 7 x weekly - 1 sets - 2 reps - 2 deep breathes hold - Alternating Punch with Resistance  - 1 x daily - 4-5 x weekly - 1 sets - 10 reps - 5 seconds hold - Standing Scapular Protraction with Resistance  - 1 x daily - 4-5 x weekly - 1 sets - 10 reps - 5 seconds hold - Standing alternate rows with resistance  - 1 x daily - 4-5 x weekly - 1 sets - 10 reps - 5 seconds hold - Standing Row with Anchored Resistance  - 1 x daily - 4-5 x weekly - 1 sets - 10 reps - 5 seconds hold - Alternating elbow flexion with resistance  - 1 x daily - 4-5 x weekly - 1 sets - 10 reps - 5 seconds hold - Standing Bicep Curls with Resistance  - 1 x daily - 4-5 x weekly - 1 sets - 10 reps - 5 seconds hold  Do each exercise 1-2  times per day Do each exercise 5-10 repetitions Hold each exercise for 2 seconds to feel your location  AT SINK FIND YOUR MIDLINE POSITION AND PLACE FEET EQUAL DISTANCE FROM THE MIDLINE.  Try to find this position when standing still for activities.   USE TAPE ON FLOOR TO MARK THE MIDLINE POSITION which is even with middle of sink.  You also should try to feel with your limb pressure in socket.  You are trying to feel with limb what you used to feel with the bottom of your foot.  Side to Side Shift: Moving your hips only (not shoulders): move weight onto your left leg, HOLD/FEEL pressure in socket.  Move  back to equal weight on each leg, HOLD/FEEL pressure in socket. Move weight onto your right leg, HOLD/FEEL pressure in socket. Move back to  equal weight on each leg, HOLD/FEEL pressure in socket. Repeat.  Start with both hands on sink, progress to hand on prosthetic side only, then no hands.  Front to Back Shift: Moving your hips only (not shoulders): move your weight forward onto your toes, HOLD/FEEL pressure in socket. Move your weight back to equal Flat Foot on both legs, HOLD/FEEL  pressure in socket. Move your weight back onto your heels, HOLD/FEEL  pressure in socket. Move your weight back to equal on both legs, HOLD/FEEL  pressure in socket. Repeat.  Start with both hands on sink, progress to hand on prosthetic side only, then no hands.  Moving Cones / Cups: With equal weight on each leg: Hold on with one hand the first time, then progress to no hand supports. Move cups from one side of sink to the other. Place cups ~2" out of your reach, progress to 10" beyond reach.  Place one hand in middle of sink and reach with other hand. Do both arms.  Then hover one hand and move cups with other hand.  Overhead/Upward Reaching: alternated reaching up to top cabinets or ceiling if no cabinets present. Keep equal weight on each leg. Start with one hand support on counter while other hand reaches and progress to no hand support with reaching.  ace one hand in middle of sink and reach with other hand. Do both arms.  Then hover one hand and move cups with other hand.  5.   Looking Over Shoulders: With equal weight on each leg: alternate turning to look over your shoulders with one hand support on counter as needed.  Start with head motions only to look in front of shoulder, then even with shoulder and progress to looking behind you. To look to side, move head /eyes, then shoulder on side looking pulls back, shift more weight to side looking and pull hip back. Place one hand in middle of sink and let go with other hand so your shoulder can pull back. Switch hands to look other way.   Then hover one hand and look over shoulder. If looking right, use left  hand at sink. If looking left, use right hand at sink. 6.  Stepping with leg that is not amputated:  Move items under cabinet out of your way. Shift your hips/pelvis so weight on prosthesis. Tighten muscles in hip on prosthetic side.  SLOWLY step other leg so front of foot is in cabinet. Then step back to floor.    ASSESSMENT:  CLINICAL IMPRESSION:    Patient has made significant progress with PT for functioning with his above knee prosthesis.  He is now accessing the community with RW & prosthesis instead of w/c.  He has potential to use a cane for home & community to improve his mobility.  PT instructed in new tasks of stepping over obstacle & picking up items from floor utilizing the prosthesis which he seems to have general understanding but needs more work to become independent & safe.    OBJECTIVE IMPAIRMENTS: Abnormal gait, decreased activity tolerance, decreased balance, decreased endurance, decreased knowledge of condition, decreased knowledge of use of DME, decreased mobility, difficulty walking, decreased ROM, decreased strength, impaired flexibility, postural dysfunction, prosthetic dependency , and pain.   ACTIVITY LIMITATIONS: carrying, lifting, bending, sitting, standing, squatting, stairs, transfers, bathing, toileting, dressing, reach over head, and locomotion  level  PARTICIPATION LIMITATIONS: meal prep, cleaning, laundry, driving, shopping, community activity, and yard work  PERSONAL FACTORS: Age, Fitness, Past/current experiences, Time since onset of injury/illness/exacerbation, and 3+ comorbidities: see PMH are also affecting patient's functional outcome.   REHAB POTENTIAL: Good  CLINICAL DECISION MAKING: Evolving/moderate complexity  EVALUATION COMPLEXITY: Moderate   GOALS: Goals reviewed with patient? Yes  SHORT TERM GOALS: Target date: 04/05/2024  Patient donnes prosthesis modified independent & verbalizes proper cleaning. Baseline: SEE OBJECTIVE DATA Goal status:  MET, 04/10/2024  2.  Patient tolerates prosthesis >12 hrs total /day without skin issues or limb pain >7/10 after standing. Baseline: patient reports wearing 4-5 hr/day Goal status: GOAL MET, 70/23/2025  3.  Patient able to reach 7 and look over both shoulders with RW support with Modified independent. Baseline: SEE OBJECTIVE DATA Goal status: MET, 04/10/2024  4. Patient ambulates 125' with RW & prosthesis with supervision. Baseline: SEE OBJECTIVE DATA Goal status: MET, 04/10/2024  5. Patient negotiates ramps & curbs with RW & prosthesis with supervision. Baseline: SEE OBJECTIVE DATA Goal status: MET, 04/10/2024  LONG TERM GOALS: Target date: 08/23/2024  Patient demonstrates & verbalized understanding of prosthetic care to enable safe utilization of prosthesis. Baseline: SEE OBJECTIVE DATA Goal status: MET 05/29/2024  Patient tolerates prosthesis wear >90% of awake hours without skin issues or limb pain >4/10. Baseline: SEE OBJECTIVE DATA wearing most of awake hours without skin issues but having limb pain.  Goal status: Ongoing     06/26/2024   BERG >/= 25/56 to indicate lower fall risk Baseline: SEE OBJECTIVE DATA Goal status: GOAL MET, 05/31/2024  Patient ambulates >300' with prosthesis and cane modified independently Baseline: SEE OBJECTIVE DATA Goal status: Ongoing    06/26/2024  Patient negotiates ramps, curbs & stairs with single rail with prosthesis and cane independently. Baseline: SEE OBJECTIVE DATA Goal status: Ongoing    06/26/2024  6.  Patient reports Patient-Specific Activity Score improved average >7 to indicate improvement in functional activities.   Baseline: SEE OBJECTIVE DATA Goal status: Ongoing  06/26/2024  PLAN:  PT FREQUENCY: 1-2x/week  PT DURATION: 12 weeks    PLANNED INTERVENTIONS: 97164- PT Re-evaluation, 97750- Physical Performance Testing, 97110-Therapeutic exercises, 97530- Therapeutic activity, 97112- Neuromuscular re-education, 97535- Self  Care, 02859- Manual therapy, [phone]- Gait training, [phone]- Prosthetic Initial , [phone]- Orthotic/Prosthetic subsequent, Patient/Family education, Balance training, Stair training, Joint mobilization, and DME instructions  PLAN FOR NEXT SESSION: check on [facility] Medicare reauthorization.  Check on HEP for balance with green Thera-Band, posture, gait with cane including carrying items.     Grayce Spatz, PT, DPT 06/28/2024, 2:45 PM    Date of referral: 02/13/2024  Referring provider: Harden Jerona GAILS, MD Referring diagnosis? S10.387 (ICD-10-CM) - Hx of AKA (above knee amputation), left (HCC)  Treatment diagnosis? (if different than referring diagnosis) R26.89, M62.81, M79.605, M25.652, R29.3, R26.81, L98.498  What was this (referring dx) caused by? Other: Lt AKA  vascular condition  Nature of Condition: Chronic (continuous duration > 3 months)  Prosthesis delivery 02/20/2024   Laterality: Lt  Current Functional Measure Score: Patient Specific Functional Scale Score:  5.66    Berg on 06/26/24 38/56  (on 03/05/2024 was 11/56)   score <45/56 indicates high fall risk but 27 point improvement is significant.   Objective measurements identify impairments when they are compared to normal values, the uninvolved extremity, and prior level of function.  [x]  Yes  []  No  Objective assessment of functional ability: Moderate functional limitations   Briefly describe symptoms: Patient is  wearing prosthesis most of awake hours without skin issues but is having limb pain.  He is accessing community with walker & prosthesis but is limited.  He is dependent for gait with cane but has potential to use with skilled care.  Patient has improved balance - Berg on 06/26/24 38/56  (on 03/05/2024 was 11/56)   score <45/56 indicates high fall risk but 27 point improvement is significant.   How did symptoms start: amputation due to vascular issues  Average pain intensity:  Last 24 hours: 3-/10  Past week: 3-6/10  How  often does the pt experience symptoms? Frequently  How much have the symptoms interfered with usual daily activities? Quite a bit  How has condition changed since care began at this facility? Better  In general, how is the patients overall health? Good   BACK PAIN (STarT Back Screening Tool) No

## 2024-07-02 ENCOUNTER — Encounter

## 2024-07-03 ENCOUNTER — Encounter: Payer: Self-pay | Admitting: *Deleted

## 2024-07-03 NOTE — Progress Notes (Signed)
 THOM OLLINGER                                          MRN: [id number]   07/03/2024   The [facility] Quality Team Specialist reviewed this patient medical record for the purposes of chart review for care gap closure. The following were reviewed: chart review for care gap closure-glycemic status assessment.    [facility] Quality Team

## 2024-07-04 ENCOUNTER — Encounter: Admitting: Physical Therapy

## 2024-07-04 NOTE — Therapy (Incomplete)
 OUTPATIENT PHYSICAL THERAPY PROSTHETIC TREATMENT   Patient Name: Mike Floyd MRN: [id number] DOB:May 22, 1961, 63 y.o., male Today's Date: 07/04/2024  END OF SESSION:        Past Medical History:  Diagnosis Date   AICD (automatic cardioverter/defibrillator) present 11/2007   Removed in 2018, a. 11/2007 [facility] Current VR - single lead ICD  - Removed 2018 - it was burning me   Anxiety    CAD (coronary artery disease)    non-obstructive CAD by Cor CT in 2020 // Myoview  3/22: EF 57, small inf-sept defect c/w scar, no ischemia; low risk      Chest pain    a. 10/2007 Cath:  normal Cors.   CKD (chronic kidney disease), stage II    DDD (degenerative disc disease), lumbar    Diabetes mellitus DX: 2010   type 2   Erosive esophagitis    a. per EGD (08/2011), Dr. Golda - Erosive reflux esophagitis improved but not completely healed since previous EGD 3 years ago. Bx showing  ulcerated gatroesophageal junction mucosa. negative for H. pylori   GERD (gastroesophageal reflux disease)    Gout    Hearing deficit    a. wear bilateral hearing aides   History of hiatal hernia    History of kidney stones    passed stones, no surgery   Hypertension    Mildly dilatd aortic root (HCC)    CMR 4/22: EF 52, no LGE; d/w Dr. Ione root 38 mm (mildly dilated)   Myocardial infarction [facility]) 2011   Neuropathy    Feet and legs   Nonischemic dilated cardiomyopathy (HCC)    a. H/O EF as low as 35-40% by LV gram 10/2007;  b. Echo 02/2011 EF 50-55%, inf HK, Gr 1 DD // CMR 4/22: EF 52, no LGE; d/w Dr. Ione root 38 mm (mildly dilated)    Renal insufficiency    Sleep apnea    pt doesnt use, statesI cant afford one. PCP aware   Stroke (HCC)    mini-stroke in 2014   TIA (transient ischemic attack)    July, 2013   Tobacco abuse, in remission 06/27/2009   Discontinued in 2009     Wears dentures    top plate   WPW (Wolff-Parkinson-White syndrome)    a. s/p RFCA @ [facility] - 1999   Past Surgical  History:  Procedure Laterality Date   AMPUTATION Left 05/19/2022   Procedure: TRANSMETATARSAL AMPUTATION LEFT FOOT;  Surgeon: Harden Jerona GAILS, MD;  Location: [facility] OR;  Service: Orthopedics;  Laterality: Left;   AMPUTATION Left 06/17/2023   Procedure: LEFT BELOW KNEE AMPUTATION;  Surgeon: Harden Jerona GAILS, MD;  Location: [facility] OR;  Service: Orthopedics;  Laterality: Left;   AMPUTATION Left 08/19/2023   Procedure: LEFT ABOVE KNEE AMPUTATION;  Surgeon: Harden Jerona GAILS, MD;  Location: [facility] OR;  Service: Orthopedics;  Laterality: Left;   APPENDECTOMY     BACK SURGERY     1995   CARDIAC CATHETERIZATION     2009   CARDIAC DEFIBRILLATOR PLACEMENT     ICD was removed in 2018   CHOLECYSTECTOMY     CHOLECYSTECTOMY, LAPAROSCOPIC     11/2007   COLONOSCOPY W/ POLYPECTOMY  2009   ELBOW SURGERY Left 06/2010   ESOPHAGEAL DILATION N/A 11/08/2014   Procedure: ESOPHAGEAL DILATION;  Surgeon: Claudis RAYMOND Golda, MD;  Location: [facility] ORS;  Service: Endoscopy;  Laterality: N/A;  #56,    ESOPHAGOGASTRODUODENOSCOPY  03/31/2012   also 08/2011; [name]   ESOPHAGOGASTRODUODENOSCOPY (EGD) WITH  PROPOFOL  N/A 11/08/2014   Procedure: ESOPHAGOGASTRODUODENOSCOPY (EGD) WITH PROPOFOL ;  Surgeon: Claudis RAYMOND Rivet, MD;  Location: [facility] ORS;  Service: Endoscopy;  Laterality: N/A;  Hiatus is 51 , GE [place]unction is 37   FOOT ARTHRODESIS Left 10/24/2020   Procedure: LEFT GASTROCNEMIUS RECESSION, DORSIFLEXION OSTEOTOMY 1ST MT;  Surgeon: Harden Jerona GAILS, MD;  Location: [facility] OR;  Service: Orthopedics;  Laterality: Left;   FOOT ARTHRODESIS Right 01/09/2021   Procedure: CLOSING WEDGE OSTEOTOMY RIGHT 1ST METATARSAL;  Surgeon: Harden Jerona GAILS, MD;  Location: [facility] OR;  Service: Orthopedics;  Laterality: Right;   GASTROCNEMIUS RECESSION Right 01/09/2021   Procedure: RIGHT GASTROCNEMIUS RECESSION;  Surgeon: Harden Jerona GAILS, MD;  Location: [facility] OR;  Service: Orthopedics;  Laterality: Right;   ICD LEAD REMOVAL N/A 10/07/2016   Procedure: ICD LEAD REMOVAL ;  Surgeon: Danelle LELON Birmingham, MD;  Location: [facility] OR;  Service: Cardiovascular;  Laterality: N/A;   MULTIPLE TOOTH EXTRACTIONS     RADIOFREQUENCY ABLATION  1999   WPW; performed at [facility]   STUMP REVISION Left 07/22/2023   Procedure: REVISION LEFT BELOW KNEE AMPUTATION;  Surgeon: Harden Jerona GAILS, MD;  Location: [facility] OR;  Service: Orthopedics;  Laterality: Left;   TEE WITHOUT CARDIOVERSION N/A 10/07/2016   Procedure: TRANSESOPHAGEAL ECHOCARDIOGRAM (TEE);  Surgeon: Danelle LELON Birmingham, MD;  Location: [facility] OR;  Service: Cardiovascular;  Laterality: N/A;   Patient Active Problem List   Diagnosis Date Noted   Dyslipidemia 11/03/2023   Essential hypertension 11/03/2023   Type 2 diabetes mellitus with peripheral neuropathy (HCC) 11/03/2023   BPH (benign prostatic hyperplasia) 11/03/2023   GERD without esophagitis 11/03/2023   S/P AKA (above knee amputation) unilateral, left (HCC) 08/19/2023   Dehiscence of amputation stump of left lower extremity (HCC) 07/22/2023   Ischemia of site of left below knee amputation (HCC) 07/22/2023   S/P BKA (below knee amputation) unilateral, left (HCC) 07/22/2023   Below-knee amputation of left lower extremity (HCC) 06/17/2023   Acute osteomyelitis of metatarsal bone of left foot (HCC)    Subacute osteomyelitis, left ankle and foot (HCC) 05/13/2022   Non-pressure chronic ulcer of other part of right foot limited to breakdown of skin (HCC)    Contracture of right Achilles tendon    Mildly dilatd aortic root (HCC)    Contracture of left Achilles tendon    Metatarsal deformity, left    CAD (coronary artery disease)    Hallux hammertoe, left 12/11/2019   Diarrhea 06/27/2019   Diastasis recti 08/22/2018   Umbilical hernia without obstruction and without gangrene 08/22/2018   Vitamin D  deficiency 06/23/2017   Essential hypertension, benign 06/15/2017   Mixed hyperlipidemia 06/15/2017   Class 1 obesity due to excess calories with serious comorbidity and body mass index (BMI) of 31.0 to 31.9  in adult 06/15/2017   Malfunction of implantable cardioverter-defibrillator (ICD) electrode 10/07/2016   Chronic kidney disease (CKD), stage III (moderate) (HCC) 04/04/2015   Bladder neck obstruction 03/21/2015   Difficult or painful urination 03/21/2015   Flank pain 03/21/2015   Delayed onset of urination 03/21/2015   Calculus of kidney 03/21/2015   Erosive esophagitis 12/27/2013   Hypotension due to drugs 07/02/2013   Chronic systolic heart failure (HCC) 04/24/2013   CAP (community acquired pneumonia) 03/16/2013   HTN (hypertension) 03/15/2013   Hydrocele 03/15/2013   Spermatocele 03/15/2013   DOE (dyspnea on exertion) 03/15/2013   OSA (obstructive sleep apnea) 01/09/2013   Chronic kidney disease, stage 3 (HCC) 01/03/2013   Chest pain 12/26/2012  History of diagnostic tests 09/06/2012   TIA (transient ischemic attack)    Cardiomyopathy, nonischemic (HCC) 02/24/2010   AICD (automatic cardioverter/defibrillator) 02/24/2010   DM type 2 causing vascular disease (HCC) 06/27/2009   Gout 06/27/2009   Tobacco abuse, in remission 06/27/2009   COLONIC POLYPS 10/31/2008   GERD (gastroesophageal reflux disease) 10/31/2008    PCP: Gladis Lauraine BRAVO, NP  REFERRING PROVIDER: Shona Norleen PEDLAR, MD  REFERRING DIAG: [phone] (ICD-10-CM) - Hx of AKA (above knee amputation), left (HCC)   THERAPY DIAG:  No diagnosis found.  Rationale for Evaluation and Treatment: Rehabilitation  ONSET DATE: Prosthesis delivery 02/20/2024  SUBJECTIVE:   SUBJECTIVE STATEMENT:   ***  He feels like he is making good progress with PT but thinks he needs more PT to improve more. He wants PT to ask for more visits.     PERTINENT HISTORY: DM2, GERD, hypertension, dyslipidemia, CAD, L BKA (06/17/23), neuropathy  Eval: Patient is a 63 year old male who presents to physical therapy with a left AKA (08/19/2023) with a new prosthesis delivery on 02/20/2024. Patient had previous home health PT prior to Lt AKA. He is able to  mobilize with modified independence w/ walker. Wears prosthesis about ten hours a day and by the end of the night he takes it off due to fatigue and pain.   PAIN:  Are you having pain? Yes: NPRS scale: ***  3/10 today, 6/10 at worst since last PT Pain location: Femur in prosthesis and pain in the Rt leg as well Pain description: feels like its been hammered Aggravating factors: keeping prosthesis on for long periods of times, sitting Relieving factors: taking off prosthesis and medications  PRECAUTIONS: Fall  RED FLAGS: None   WEIGHT BEARING RESTRICTIONS: No  FALLS:  Has patient fallen in last 6 months? Yes. Number of falls 2, forgetting he doesn't have a leg and put weight onto the left side. He did not hit the floor, only fell into another person. Occurred when transferring from sitting to standing  LIVING ENVIRONMENT: Lives with: lives with their spouse Lives in: Other Trailer on grass Stairs: Yes: External: 4 steps; can reach both Has following equipment at home: Single point cane, Environmental Consultant - 2 wheeled, Wheelchair (manual), and shower chair  OCCUPATION: retired   PLOF: Independent with household mobility with device  PATIENT GOALS: ambulating and ADLs with cane   NEXT MD VISIT: Office MD visit: 03/21/2024   OBJECTIVE:  Patient-Specific Activity Scoring Scheme  0 represents "unable to perform." 10 represents "able to perform at prior level. 0 1 2 3 4 5 6 7 8 9  10 (Date and Score)   Activity Eval  06/19/24   1. Walking w/ cane  2 3   2. Standing for ADLs  5  7  3. Stairs 5 7  4.    5.    Score 4 5.66   Total score = sum of the activity scores/number of activities Minimum detectable change (90%CI) for average score = 2 points Minimum detectable change (90%CI) for single activity score = 3 points  DIAGNOSTIC FINDINGS: none post-amputation  COGNITION: Overall cognitive status: Within functional limits for tasks assessed   SENSATION: WFL  MUSCLE  LENGTH:   POSTURE: rounded shoulders, forward head, flexed trunk , and weight shift right  LOWER EXTREMITY ROM:  ROM P:passive  A:active Left eval 06/14/24 06/28/24  Hip flexion     Hip extension Thomas position: P: - 40* P: -27 Supine Thomas Left/right -22*/-17*  Hip abduction  Hip adduction     Hip internal rotation     Hip external rotation     Knee flexion     Knee extension     Ankle dorsiflexion     Ankle plantarflexion     Ankle inversion     Ankle eversion      (Blank rows = not tested)  LOWER EXTREMITY MMT:  MMT Right eval Left eval Right 06/28/24 Left 06/28/24  Hip flexion 3/5 3/5 4/5 4-/5  Hip extension 3/5 3-/5 4/5 4-/5  Hip abduction  3-/5  4-/5  Hip adduction      Hip internal rotation      Hip external rotation      Knee flexion      Knee extension 3/5  4/5   Ankle dorsiflexion      Ankle plantarflexion      Ankle inversion      Ankle eversion      At Evaluation all strength testing is grossly seated and functionally standing / gait. (Blank rows = not tested)  BED MOBILITY:  Evaluation on 03/05/2024 Sit to supine Complete Independence Supine to sit Complete Independence Rolling to Right Complete Independence  TRANSFERS: 06/28/2024:  sit to/from stand 18 chair with armrests to cane stand alone tip safely.  04/10/2024: Pt able stand to/from sit into chair without armrest using BUEs on seat and RW for stabilization with supervision/verbal cues.  Eval: Sit to stand: supervision requires a stable object to arise, stabilization w/ walker using UE support, with delayed weight shift onto the prosthesis for stabilization, and uses UE to bring Lt prosthesis back below his body  Stand to sit: supervision requires a stable object to arise, stabilization w/ walker using UE support, with delayed weight shift onto the prosthesis for stabilization  FUNCTIONAL TESTs:  06/26/2024:  Timed Up & Go with cane with CGA 33.85sec. Berg Balance [id number]  [facility] PT  Assessment - 06/26/24 1345       Berg Balance Test   Sit to Stand Able to stand  independently using hands    Standing Unsupported Able to stand safely 2 minutes    Sitting with Back Unsupported but Feet Supported on Floor or Stool Able to sit safely and securely 2 minutes    Stand to Sit Controls descent by using hands    Transfers Able to transfer safely, minor use of hands    Standing Unsupported with Eyes Closed Able to stand 10 seconds with supervision    Standing Unsupported with Feet Together Able to place feet together independently and stand for 1 minute with supervision    From Standing, Reach Forward with Outstretched Arm Can reach confidently >25 cm (10)    From Standing Position, Pick up Object from Floor Able to pick up shoe, needs supervision    From Standing Position, Turn to Look Behind Over each Shoulder Turn sideways only but maintains balance    Turn 360 Degrees Needs assistance while turning   needs minA without device for safety.   Standing Unsupported, Alternately Place Feet on Step/Stool Able to complete >2 steps/needs minimal assist    Standing Unsupported, One Foot in Front Able to plae foot ahead of the other independently and hold 30 seconds    Standing on One Leg Tries to lift leg/unable to hold 3 seconds but remains standing independently    Total Score 38    Berg comment: BERG  < 36 high risk for falls (close to 100%) 46-51 moderate (>50%)  37-45 significant (>80%) 52-55 lower (> 25%)         BERG: 06/14/2024 31/56 performed with RW in front of patient and intermittently using RW during breaks   04/10/2024: Patient able to reach 10 forward and look over his shoulders with single upper extremity support on RW.   Eval:  BERG score 11/56 05/31/2024: performed with standing rest breaks at RW, but performed below tasks without RW   [facility] PT Assessment - 05/31/24 0001       Berg Balance Test   Sit to Stand Able to stand using hands after several tries     Standing Unsupported Able to stand 2 minutes with supervision    Sitting with Back Unsupported but Feet Supported on Floor or Stool Able to sit safely and securely 2 minutes    Stand to Sit Uses backs of legs against chair to control descent    Transfers Able to transfer safely, definite need of hands    Standing Unsupported with Eyes Closed Able to stand 10 seconds with supervision    Standing Unsupported with Feet Together Able to place feet together independently and stand for 1 minute with supervision    From Standing, Reach Forward with Outstretched Arm Reaches forward but needs supervision    From Standing Position, Pick up Object from Floor Able to pick up shoe, needs supervision    From Standing Position, Turn to Look Behind Over each Shoulder Needs supervision when turning    Turn 360 Degrees Needs assistance while turning    Standing Unsupported, Alternately Place Feet on Step/Stool Needs assistance to keep from falling or unable to try    Standing Unsupported, One Foot in Front Able to plae foot ahead of the other independently and hold 30 seconds    Standing on One Leg Tries to lift leg/unable to hold 3 seconds but remains standing independently    Total Score 29    Berg comment: BERG < 36 high risk for falls (close to 100%) 46-51 moderate (>50%) 37-45 significant (>80%) 52-55 lower (> 25%)         CARDIOVASCULAR RESPONSE: Functional activity: gait with RW & cane noted below Pre-activity vitals: HR: 94 SpO2: 98% Post-activity vitals: HR: 107 (took only 1 min to recover to baseline) SpO2: 96%   GAIT: 06/28/2024: Patient amb >300' with RW & prosthesis modified independent. Gait velocity with RW: self-selected pace 1.64 ft/sec & fast pace 2.29 ft/sec Patient neg ramp & curb with RW & prosthesis modified independent.  Patient is now accessing community with RW & prosthesis instead of w/c.   Pt amb 150' with cane stand alone tip & AKA prosthesis with min/CGA.  Gait  velocity with cane: self-selected pace 1.44 ft/sec & fast pace 2.02 ft/sec Pt neg ramp & curb with cane stand alone tip & AKA prosthesis with minA & cues.   05/31/2024 Gait Speed:  Self-selected: 1.59 ft/sec Fast-as-comfortable: 2.29 ft/sec    04/10/2024: Patient ambulated 125' with RW and supervision.   Patient negotiated 12*ramp and 6.5 curb with RW and supervision  Eval: Gait pattern: step to pattern, decreased step length- Right, decreased stance time- Left, decreased hip/knee flexion- Left, circumduction- Left, Left hip hike, antalgic, trendelenburg, lateral lean- Right, trunk flexed, abducted- Left, and poor foot clearance- Left Distance walked: 62 ft Assistive device utilized: Walker - 2 wheeled and AKA prosthesis Level of assistance: SBA / steady with RW support but needs cues  Gait velocity: 0.72 ft/sec Comments: excessive weightbearing on BUEs using RW  CURRENT PROSTHETIC WEAR ASSESSMENT: 06/26/2024:  Pt is wearing prosthesis all awake hours with no skin issues but limb pain 2/10-6/10 normally.   He is independent with donning, doffing, skin check, residual limb care, care of non-amputated limb, prosthetic cleaning, ply sock cleaning, correct ply sock adjustment, proper wear schedule/adjustment, and proper weight-bearing schedule/adjustment  Evaluation:   Patient is dependent with: skin check, residual limb care, care of non-amputated limb, prosthetic cleaning, ply sock cleaning, correct ply sock adjustment, proper wear schedule/adjustment, and proper weight-bearing schedule/adjustment Donning prosthesis: SBA / verbal cues Doffing prosthesis: Complete Independence Prosthetic wear tolerance: 6-8 hours, 1x/day, 7 days/week Prosthetic weight bearing tolerance: tolerated standing for 10 minutes with partial weight on prosthesis during BERG balance test with no increased pain. Although he reports standing pain with activities at home. Edema: minimal Residual limb condition:  3x69mm superficial wound distally located, scar with mild adherence, no adductor roll, normal skin color, decreased hair growth present Prosthetic description: silicone liner with suction ring suspension, ischial containment socket with flexible inner socket, rotator unit, SAFETY (single axis friction engaging with an extension assist), flexible keel foot  TODAY'S TREATMENT:                                                                                                                    DATE: 07/04/2024 Prosthetic training with transfemoral prosthesis: ***   Pt amb 120' with cane stand alone tip scanning right/left and up/down with minA. PT demo & verbal cues on stepping over obstacle.  Pt stepped over 3 foam roll with cane stand alone tip with mod/HHA initial 2 reps, then modA / no HHA 2 reps. PT demo & verbal cues how to pick up object from floor using prosthesis for stability.  Pt able to pick up item 3 reps with minA & cues.  Pt able to pick up 3 reps with RW with supervision. PT discussed high risk of injury with performing activities on ladder.  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  Pt verbalized understanding.      TREATMENT:                                                                                                                    DATE: 06/26/2024 Prosthetic training with transfemoral prosthesis: See gait in objective data Pt amb 120' with cane stand alone tip scanning right/left and up/down with minA. PT demo & verbal cues on stepping over obstacle.  Pt stepped  over 3 foam roll with cane stand alone tip with mod/HHA initial 2 reps, then modA / no HHA 2 reps. PT demo & verbal cues how to pick up object from floor using prosthesis for stability.  Pt able to pick up item 3 reps with minA & cues.  Pt able to pick up 3 reps with RW with supervision. PT discussed high risk of injury with performing activities on ladder.   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  Pt verbalized understanding.       TREATMENT:                                                                                                                    DATE: 06/21/2024 Prosthetic training with transfemoral prosthesis: sit to/from stand 18 chair without armrests using RUE on seat with cane for stabilization with supervision.   Patient ambulated 18' x 2 with cane stand-alone tip with min/CGA. 2nd rep carrying open bottle of water  with no spills but one misstep requiring minA to balance.   PT cues on upright posture, step length and weight shift over prosthesis in stance. Pt neg stairs with single rail & cane (4 steps left rail / RUE cane & 4 steps right rail / LUE cane) with verbal cues and CGA.    Neuromuscular reeducation working on standing balance Standing balance with upper extremity resistance using green Thera-Band to facilitate core stabilization and weightbearing through both lower extremities.  PT min/CGA with tactile cues and verbal cues for upright posture and balance reactions.  Single upper extremity alternating sides 5 reps each and bilateral upper extremities 10 reps: Row, forward reach and biceps curl/upward reach. PT demo & verbal cues & HO for HEP including safe set-up & use of bands. Pt verbalized understanding after performing during today's session.     TREATMENT:                                                                                                                    DATE: 06/19/2024 Prosthetic training with transfemoral prosthesis: PT reviewed recommendations for prosthetist at appointment tomorrow to perform a dynamic alignment and modify the socket to address pain issues.  Patient verbalized understanding. Patient ambulated 50' x 2 with cane stand-alone tip with min/CGA.  PT cues on upright posture, step length and weight shift over prosthesis in stance. PT  demo and verbal cues on negotiating curb with a cane and prosthesis.  Patient  negotiated 6.5 curb 2 reps with min to modA.    Neuromuscular reeducation working on standing balance Standing balance with upper extremity resistance using green Thera-Band to facilitate core stabilization and weightbearing through both lower extremities.  PT min/CGA with tactile cues and verbal cues for upright posture and balance reactions.  Single upper extremity alternating sides 5 reps each and bilateral upper extremities 10 reps: Row, forward reach and biceps curl/upward reach.    HOME EXERCISE PROGRAM: Access Code: [email or web address] URL: [email or web address] Date: 06/21/2024 Prepared by: Grayce Spatz  Exercises - Modified Thomas Stretch  - 2 x daily - 7 x weekly - 1 sets - 2-3 reps - 30 seconds hold - Hip Flexor Stretch at Edge of Bed  - 2 x daily - 7 x weekly - 1 sets - 2-3 reps - 30 seconds hold - Standing posture with back to counter  - 2 x daily - 7 x weekly - 1 sets - 1 reps - 5-10 minutes hold - Upright Stance at Door Frame Single Arm  - 3-6 x daily - 7 x weekly - 1 sets - 2 reps - 2 deep breathes hold - Upright Stance at Door Frame with Both Arms  - 3-6 x daily - 7 x weekly - 1 sets - 2 reps - 2 deep breathes hold - Alternating Punch with Resistance  - 1 x daily - 4-5 x weekly - 1 sets - 10 reps - 5 seconds hold - Standing Scapular Protraction with Resistance  - 1 x daily - 4-5 x weekly - 1 sets - 10 reps - 5 seconds hold - Standing alternate rows with resistance  - 1 x daily - 4-5 x weekly - 1 sets - 10 reps - 5 seconds hold - Standing Row with Anchored Resistance  - 1 x daily - 4-5 x weekly - 1 sets - 10 reps - 5 seconds hold - Alternating elbow flexion with resistance  - 1 x daily - 4-5 x weekly - 1 sets - 10 reps - 5 seconds hold - Standing Bicep Curls with Resistance  - 1 x daily - 4-5 x weekly - 1 sets - 10 reps - 5 seconds hold  Do each exercise 1-2  times per day Do each exercise  5-10 repetitions Hold each exercise for 2 seconds to feel your location  AT SINK FIND YOUR MIDLINE POSITION AND PLACE FEET EQUAL DISTANCE FROM THE MIDLINE.  Try to find this position when standing still for activities.   USE TAPE ON FLOOR TO MARK THE MIDLINE POSITION which is even with middle of sink.  You also should try to feel with your limb pressure in socket.  You are trying to feel with limb what you used to feel with the bottom of your foot.  Side to Side Shift: Moving your hips only (not shoulders): move weight onto your left leg, HOLD/FEEL pressure in socket.  Move back to equal weight on each leg, HOLD/FEEL pressure in socket. Move weight onto your right leg, HOLD/FEEL pressure in socket. Move back to equal weight on each leg, HOLD/FEEL pressure in socket. Repeat.  Start with both hands on sink, progress to hand on prosthetic side only, then no hands.  Front to Back Shift: Moving your hips only (not shoulders): move your weight forward onto your toes, HOLD/FEEL pressure in socket. Move your weight back to equal Flat Foot on both legs, HOLD/FEEL  pressure in socket. Move your weight back onto your heels, HOLD/FEEL  pressure in socket.  Move your weight back to equal on both legs, HOLD/FEEL  pressure in socket. Repeat.  Start with both hands on sink, progress to hand on prosthetic side only, then no hands.  Moving Cones / Cups: With equal weight on each leg: Hold on with one hand the first time, then progress to no hand supports. Move cups from one side of sink to the other. Place cups ~2" out of your reach, progress to 10" beyond reach.  Place one hand in middle of sink and reach with other hand. Do both arms.  Then hover one hand and move cups with other hand.  Overhead/Upward Reaching: alternated reaching up to top cabinets or ceiling if no cabinets present. Keep equal weight on each leg. Start with one hand support on counter while other hand reaches and progress to no hand support with  reaching.  ace one hand in middle of sink and reach with other hand. Do both arms.  Then hover one hand and move cups with other hand.  5.   Looking Over Shoulders: With equal weight on each leg: alternate turning to look over your shoulders with one hand support on counter as needed.  Start with head motions only to look in front of shoulder, then even with shoulder and progress to looking behind you. To look to side, move head /eyes, then shoulder on side looking pulls back, shift more weight to side looking and pull hip back. Place one hand in middle of sink and let go with other hand so your shoulder can pull back. Switch hands to look other way.   Then hover one hand and look over shoulder. If looking right, use left hand at sink. If looking left, use right hand at sink. 6.  Stepping with leg that is not amputated:  Move items under cabinet out of your way. Shift your hips/pelvis so weight on prosthesis. Tighten muscles in hip on prosthetic side.  SLOWLY step other leg so front of foot is in cabinet. Then step back to floor.    ASSESSMENT:  CLINICAL IMPRESSION:    ***   Patient has made significant progress with PT for functioning with his above knee prosthesis.  He is now accessing the community with RW & prosthesis instead of w/c.  He has potential to use a cane for home & community to improve his mobility.  PT instructed in new tasks of stepping over obstacle & picking up items from floor utilizing the prosthesis which he seems to have general understanding but needs more work to become independent & safe.    OBJECTIVE IMPAIRMENTS: Abnormal gait, decreased activity tolerance, decreased balance, decreased endurance, decreased knowledge of condition, decreased knowledge of use of DME, decreased mobility, difficulty walking, decreased ROM, decreased strength, impaired flexibility, postural dysfunction, prosthetic dependency , and pain.   ACTIVITY LIMITATIONS: carrying, lifting, bending, sitting,  standing, squatting, stairs, transfers, bathing, toileting, dressing, reach over head, and locomotion level  PARTICIPATION LIMITATIONS: meal prep, cleaning, laundry, driving, shopping, community activity, and yard work  PERSONAL FACTORS: Age, Fitness, Past/current experiences, Time since onset of injury/illness/exacerbation, and 3+ comorbidities: see PMH are also affecting patient's functional outcome.   REHAB POTENTIAL: Good  CLINICAL DECISION MAKING: Evolving/moderate complexity  EVALUATION COMPLEXITY: Moderate   GOALS: Goals reviewed with patient? Yes  SHORT TERM GOALS: Target date: 04/05/2024  Patient donnes prosthesis modified independent & verbalizes proper cleaning. Baseline: SEE OBJECTIVE DATA Goal status: MET, 04/10/2024  2.  Patient tolerates prosthesis >12 hrs total /day without skin  issues or limb pain >7/10 after standing. Baseline: patient reports wearing 4-5 hr/day Goal status: GOAL MET, 70/23/2025  3.  Patient able to reach 7 and look over both shoulders with RW support with Modified independent. Baseline: SEE OBJECTIVE DATA Goal status: MET, 04/10/2024  4. Patient ambulates 125' with RW & prosthesis with supervision. Baseline: SEE OBJECTIVE DATA Goal status: MET, 04/10/2024  5. Patient negotiates ramps & curbs with RW & prosthesis with supervision. Baseline: SEE OBJECTIVE DATA Goal status: MET, 04/10/2024  LONG TERM GOALS: Target date: 08/23/2024  Patient demonstrates & verbalized understanding of prosthetic care to enable safe utilization of prosthesis. Baseline: SEE OBJECTIVE DATA Goal status: MET 05/29/2024  Patient tolerates prosthesis wear >90% of awake hours without skin issues or limb pain >4/10. Baseline: SEE OBJECTIVE DATA wearing most of awake hours without skin issues but having limb pain.  Goal status: Ongoing     07/04/2024  BERG >/= 25/56 to indicate lower fall risk Baseline: SEE OBJECTIVE DATA Goal status: GOAL MET, 05/31/2024  Patient  ambulates >300' with prosthesis and cane modified independently Baseline: SEE OBJECTIVE DATA Goal status: Ongoing   07/04/2024  Patient negotiates ramps, curbs & stairs with single rail with prosthesis and cane independently. Baseline: SEE OBJECTIVE DATA Goal status: Ongoing  07/04/2024  6.  Patient reports Patient-Specific Activity Score improved average >7 to indicate improvement in functional activities.   Baseline: SEE OBJECTIVE DATA Goal status: Ongoing  07/04/2024  PLAN:  PT FREQUENCY: 1-2x/week  PT DURATION: 12 weeks    PLANNED INTERVENTIONS: 97164- PT Re-evaluation, 97750- Physical Performance Testing, 97110-Therapeutic exercises, 97530- Therapeutic activity, 97112- Neuromuscular re-education, 97535- Self Care, 02859- Manual therapy, [phone]- Gait training, [phone]- Prosthetic Initial , [phone]- Orthotic/Prosthetic subsequent, Patient/Family education, Balance training, Stair training, Joint mobilization, and DME instructions  PLAN FOR NEXT SESSION: ***   check on [facility] Medicare reauthorization.  Check on HEP for balance with green Thera-Band, posture, gait with cane including carrying items.     Grayce Spatz, PT, DPT 07/04/2024, 7:38 AM    Date of referral: 02/13/2024  Referring provider: Shona Norleen PEDLAR, MD Referring diagnosis? S10.387 (ICD-10-CM) - Hx of AKA (above knee amputation), left (HCC)  Treatment diagnosis? (if different than referring diagnosis) R26.89, M62.81, M79.605, M25.652, R29.3, R26.81, L98.498  What was this (referring dx) caused by? Other: Lt AKA  vascular condition  Nature of Condition: Chronic (continuous duration > 3 months)  Prosthesis delivery 02/20/2024   Laterality: Lt  Current Functional Measure Score: Patient Specific Functional Scale Score:  5.66    Berg on 06/26/24 38/56  (on 03/05/2024 was 11/56)   score <45/56 indicates high fall risk but 27 point improvement is significant.   Objective measurements identify impairments when they are compared to  normal values, the uninvolved extremity, and prior level of function.  [x]  Yes  []  No  Objective assessment of functional ability: Moderate functional limitations   Briefly describe symptoms: Patient is wearing prosthesis most of awake hours without skin issues but is having limb pain.  He is accessing community with walker & prosthesis but is limited.  He is dependent for gait with cane but has potential to use with skilled care.  Patient has improved balance - Berg on 06/26/24 38/56  (on 03/05/2024 was 11/56)   score <45/56 indicates high fall risk but 27 point improvement is significant.   How did symptoms start: amputation due to vascular issues  Average pain intensity:  Last 24 hours: 3-/10  Past week: 3-6/10  How often does  the pt experience symptoms? Frequently  How much have the symptoms interfered with usual daily activities? Quite a bit  How has condition changed since care began at this facility? Better  In general, how is the patients overall health? Good   BACK PAIN (STarT Back Screening Tool) No

## 2024-07-10 ENCOUNTER — Encounter: Payer: Self-pay | Admitting: Physical Therapy

## 2024-07-10 ENCOUNTER — Ambulatory Visit: Admitting: Physical Therapy

## 2024-07-10 DIAGNOSIS — R2689 Other abnormalities of gait and mobility: Secondary | ICD-10-CM

## 2024-07-10 DIAGNOSIS — M25652 Stiffness of left hip, not elsewhere classified: Secondary | ICD-10-CM

## 2024-07-10 DIAGNOSIS — M79605 Pain in left leg: Secondary | ICD-10-CM

## 2024-07-10 DIAGNOSIS — R293 Abnormal posture: Secondary | ICD-10-CM

## 2024-07-10 DIAGNOSIS — M6281 Muscle weakness (generalized): Secondary | ICD-10-CM | POA: Diagnosis not present

## 2024-07-10 DIAGNOSIS — L98498 Non-pressure chronic ulcer of skin of other sites with other specified severity: Secondary | ICD-10-CM

## 2024-07-10 DIAGNOSIS — R2681 Unsteadiness on feet: Secondary | ICD-10-CM

## 2024-07-10 NOTE — Therapy (Signed)
 OUTPATIENT PHYSICAL THERAPY PROSTHETIC TREATMENT   Patient Name: Mike Floyd MRN: [id number] DOB:[birth year], 63 y.o., male Today's Date: 07/10/2024  END OF SESSION:  PT End of Session - 07/10/24 1343     Visit Number 22    Number of Visits 40    Date for Recertification  08/23/24    Authorization Type [facility] MEDICARE    Authorization Time Period [facility] MEDICARE $45 COPAY 06/28/2024 - 09/06/2024 10 VISITS    Authorization - Visit Number 1    Authorization - Number of Visits 10    Progress Note Due on Visit 30    PT Start Time 1343    PT Stop Time 1430    PT Time Calculation (min) 47 min    Equipment Utilized During Treatment Gait belt    Activity Tolerance Patient tolerated treatment well;Patient limited by fatigue    Behavior During Therapy WFL for tasks assessed/performed               Past Medical History:  Diagnosis Date   AICD (automatic cardioverter/defibrillator) present 11/2007   Removed in 2018, a. 11/2007 S[facility] Current VR - single lead ICD  - Removed 2018 - it was burning me   Anxiety    CAD (coronary artery disease)    non-obstructive CAD by Cor CT in 2020 // Myoview  3/22: EF 57, small inf-sept defect c/w scar, no ischemia; low risk      Chest pain    a. 10/2007 Cath:  normal Cors.   CKD (chronic kidney disease), stage II    DDD (degenerative disc disease), lumbar    Diabetes mellitus DX: 2010   type 2   Erosive esophagitis    a. per EGD (08/2011), Dr. Golda - Erosive reflux esophagitis improved but not completely healed since previous EGD 3 years ago. Bx showing  ulcerated gatroesophageal junction mucosa. negative for H. pylori   GERD (gastroesophageal reflux disease)    Gout    Hearing deficit    a. wear bilateral hearing aides   History of hiatal hernia    History of kidney stones    passed stones, no surgery   Hypertension    Mildly dilatd aortic root (HCC)    CMR 4/22: EF 52, no LGE; d/w Dr. Ione root 38 mm (mildly dilated)   Myocardial  infarction [facility]) 2011   Neuropathy    Feet and legs   Nonischemic dilated cardiomyopathy (HCC)    a. H/O EF as low as 35-40% by LV gram 10/2007;  b. Echo 02/2011 EF 50-55%, inf HK, Gr 1 DD // CMR 4/22: EF 52, no LGE; d/w Dr. Ione root 38 mm (mildly dilated)    Renal insufficiency    Sleep apnea    pt doesnt use, statesI cant afford one. PCP aware   Stroke (HCC)    mini-stroke in 2014   TIA (transient ischemic attack)    July, 2013   Tobacco abuse, in remission 06/27/2009   Discontinued in 2009     Wears dentures    top plate   WPW (Wolff-Parkinson-White syndrome)    a. s/p RFCA @ [facility] - 1999   Past Surgical History:  Procedure Laterality Date   AMPUTATION Left 05/19/2022   Procedure: TRANSMETATARSAL AMPUTATION LEFT FOOT;  Surgeon: Harden Jerona GAILS, MD;  Location: [facility] OR;  Service: Orthopedics;  Laterality: Left;   AMPUTATION Left 06/17/2023   Procedure: LEFT BELOW KNEE AMPUTATION;  Surgeon: Harden Jerona GAILS, MD;  Location: [facility] OR;  Service: Orthopedics;  Laterality: Left;   AMPUTATION Left 08/19/2023   Procedure: LEFT ABOVE KNEE AMPUTATION;  Surgeon: Harden Jerona GAILS, MD;  Location: [facility] OR;  Service: Orthopedics;  Laterality: Left;   APPENDECTOMY     BACK SURGERY     1995   CARDIAC CATHETERIZATION     2009   CARDIAC DEFIBRILLATOR PLACEMENT     ICD was removed in 2018   CHOLECYSTECTOMY     CHOLECYSTECTOMY, LAPAROSCOPIC     11/2007   COLONOSCOPY W/ POLYPECTOMY  2009   ELBOW SURGERY Left 06/2010   ESOPHAGEAL DILATION N/A 11/08/2014   Procedure: ESOPHAGEAL DILATION;  Surgeon: Claudis RAYMOND Rivet, MD;  Location: [facility] ORS;  Service: Endoscopy;  Laterality: N/A;  #56,    ESOPHAGOGASTRODUODENOSCOPY  03/31/2012   also 08/2011; [name]   ESOPHAGOGASTRODUODENOSCOPY (EGD) WITH PROPOFOL  N/A 11/08/2014   Procedure: ESOPHAGOGASTRODUODENOSCOPY (EGD) WITH PROPOFOL ;  Surgeon: Claudis RAYMOND Rivet, MD;  Location: [facility] ORS;  Service: Endoscopy;  Laterality: N/A;  Hi[place] is 74 , [place] is 37   FOOT  ARTHRODESIS Left 10/24/2020   Procedure: LEFT GASTROCNEMIUS RECESSION, DORSIFLEXION OSTEOTOMY 1ST MT;  Surgeon: Harden Jerona GAILS, MD;  Location: [facility] OR;  Service: Orthopedics;  Laterality: Left;   FOOT ARTHRODESIS Right 01/09/2021   Procedure: CLOSING WEDGE OSTEOTOMY RIGHT 1ST METATARSAL;  Surgeon: Harden Jerona GAILS, MD;  Location: [facility] OR;  Service: Orthopedics;  Laterality: Right;   GASTROCNEMIUS RECESSION Right 01/09/2021   Procedure: RIGHT GASTROCNEMIUS RECESSION;  Surgeon: Harden Jerona GAILS, MD;  Location: [facility] OR;  Service: Orthopedics;  Laterality: Right;   ICD LEAD REMOVAL N/A 10/07/2016   Procedure: ICD LEAD REMOVAL ;  Surgeon: Danelle LELON Birmingham, MD;  Location: [facility] OR;  Service: Cardiovascular;  Laterality: N/A;   MULTIPLE TOOTH EXTRACTIONS     RADIOFREQUENCY ABLATION  1999   WPW; performed at [facility]   STUMP REVISION Left 07/22/2023   Procedure: REVISION LEFT BELOW KNEE AMPUTATION;  Surgeon: Harden Jerona GAILS, MD;  Location: [facility] OR;  Service: Orthopedics;  Laterality: Left;   TEE WITHOUT CARDIOVERSION N/A 10/07/2016   Procedure: TRANSESOPHAGEAL ECHOCARDIOGRAM (TEE);  Surgeon: Danelle LELON Birmingham, MD;  Location: [facility] OR;  Service: Cardiovascular;  Laterality: N/A;   Patient Active Problem List   Diagnosis Date Noted   Dyslipidemia 11/03/2023   Essential hypertension 11/03/2023   Type 2 diabetes mellitus with peripheral neuropathy (HCC) 11/03/2023   BPH (benign prostatic hyperplasia) 11/03/2023   GERD without esophagitis 11/03/2023   S/P AKA (above knee amputation) unilateral, left (HCC) 08/19/2023   Dehiscence of amputation stump of left lower extremity (HCC) 07/22/2023   Ischemia of site of left below knee amputation (HCC) 07/22/2023   S/P BKA (below knee amputation) unilateral, left (HCC) 07/22/2023   Below-knee amputation of left lower extremity (HCC) 06/17/2023   Acute osteomyelitis of metatarsal bone of left foot (HCC)    Subacute osteomyelitis, left ankle and foot (HCC) 05/13/2022   Non-pressure  chronic ulcer of other part of right foot limited to breakdown of skin (HCC)    Contracture of right Achilles tendon    Mildly dilatd aortic root (HCC)    Contracture of left Achilles tendon    Metatarsal deformity, left    CAD (coronary artery disease)    Hallux hammertoe, left 12/11/2019   Diarrhea 06/27/2019   Diastasis recti 08/22/2018   Umbilical hernia without obstruction and without gangrene 08/22/2018   Vitamin D  deficiency 06/23/2017   Essential hypertension, benign 06/15/2017   Mixed hyperlipidemia 06/15/2017   Class 1 obesity  due to excess calories with serious comorbidity and body mass index (BMI) of 31.0 to 31.9 in adult 06/15/2017   Malfunction of implantable cardioverter-defibrillator (ICD) electrode 10/07/2016   Chronic kidney disease (CKD), stage III (moderate) (HCC) 04/04/2015   Bladder neck obstruction 03/21/2015   Difficult or painful urination 03/21/2015   Flank pain 03/21/2015   Delayed onset of urination 03/21/2015   Calculus of kidney 03/21/2015   Erosive esophagitis 12/27/2013   Hypotension due to drugs 07/02/2013   Chronic systolic heart failure (HCC) 04/24/2013   CAP (community acquired pneumonia) 03/16/2013   HTN (hypertension) 03/15/2013   Hydrocele 03/15/2013   Spermatocele 03/15/2013   DOE (dyspnea on exertion) 03/15/2013   OSA (obstructive sleep apnea) 01/09/2013   Chronic kidney disease, stage 3 (HCC) 01/03/2013   Chest pain 12/26/2012   History of diagnostic tests 09/06/2012   TIA (transient ischemic attack)    Cardiomyopathy, nonischemic (HCC) 02/24/2010   AICD (automatic cardioverter/defibrillator) 02/24/2010   DM type 2 causing vascular disease (HCC) 06/27/2009   Gout 06/27/2009   Tobacco abuse, in remission 06/27/2009   COLONIC POLYPS 10/31/2008   GERD (gastroesophageal reflux disease) 10/31/2008    PCP: Gladis Lauraine BRAVO, NP  REFERRING PROVIDER: Gladis Lauraine BRAVO, NP  REFERRING DIAG: [phone] (ICD-10-CM) - Hx of AKA (above knee  amputation), left (HCC)   THERAPY DIAG:  Other abnormalities of gait and mobility  Muscle weakness (generalized)  Stiffness of left hip, not elsewhere classified  Pain in left leg  Abnormal posture  Unsteadiness on feet  Non-pressure chronic ulcer of skin of other sites with other specified severity [facility])  Rationale for Evaluation and Treatment: Rehabilitation  ONSET DATE: Prosthesis delivery 02/20/2024  SUBJECTIVE:   SUBJECTIVE STATEMENT:   He has a spot on his leg.    PERTINENT HISTORY: DM2, GERD, hypertension, dyslipidemia, CAD, L BKA (06/17/23), neuropathy  Eval: Patient is a 63 year old male who presents to physical therapy with a left AKA (08/19/2023) with a new prosthesis delivery on 02/20/2024. Patient had previous home health PT prior to Lt AKA. He is able to mobilize with modified independence w/ walker. Wears prosthesis about ten hours a day and by the end of the night he takes it off due to fatigue and pain.   PAIN:  Are you having pain? Yes: NPRS scale:  3/10 today, 6/10 at worst since last PT Pain location: Femur in prosthesis and pain in the Rt leg as well Pain description: feels like its been hammered Aggravating factors: keeping prosthesis on for long periods of times, sitting Relieving factors: taking off prosthesis and medications  PRECAUTIONS: Fall  RED FLAGS: None   WEIGHT BEARING RESTRICTIONS: No  FALLS:  Has patient fallen in last 6 months? Yes. Number of falls 2, forgetting he doesn't have a leg and put weight onto the left side. He did not hit the floor, only fell into another person. Occurred when transferring from sitting to standing  LIVING ENVIRONMENT: Lives with: lives with their spouse Lives in: Other Trailer on grass Stairs: Yes: External: 4 steps; can reach both Has following equipment at home: Single point cane, Environmental Consultant - 2 wheeled, Wheelchair (manual), and shower chair  OCCUPATION: retired   PLOF: Independent with household  mobility with device  PATIENT GOALS: ambulating and ADLs with cane   NEXT MD VISIT: Office MD visit: 03/21/2024   OBJECTIVE:  Patient-Specific Activity Scoring Scheme  0 represents "unable to perform." 10 represents "able to perform at prior level. 0 1 2 3 4  5  6 7 8 9 10  (Date and Score)   Activity Eval  06/19/24   1. Walking w/ cane  2 3   2. Standing for ADLs  5  7  3. Stairs 5 7  4.    5.    Score 4 5.66   Total score = sum of the activity scores/number of activities Minimum detectable change (90%CI) for average score = 2 points Minimum detectable change (90%CI) for single activity score = 3 points  DIAGNOSTIC FINDINGS: none post-amputation  COGNITION: Overall cognitive status: Within functional limits for tasks assessed   SENSATION: WFL  MUSCLE LENGTH:   POSTURE: rounded shoulders, forward head, flexed trunk , and weight shift right  LOWER EXTREMITY ROM:  ROM P:passive  A:active Left eval 06/14/24 06/28/24  Hip flexion     Hip extension Thomas position: P: - 40* P: -27 Supine Thomas Left/right -22*/-17*  Hip abduction     Hip adduction     Hip internal rotation     Hip external rotation     Knee flexion     Knee extension     Ankle dorsiflexion     Ankle plantarflexion     Ankle inversion     Ankle eversion      (Blank rows = not tested)  LOWER EXTREMITY MMT:  MMT Right eval Left eval Right 06/28/24 Left 06/28/24  Hip flexion 3/5 3/5 4/5 4-/5  Hip extension 3/5 3-/5 4/5 4-/5  Hip abduction  3-/5  4-/5  Hip adduction      Hip internal rotation      Hip external rotation      Knee flexion      Knee extension 3/5  4/5   Ankle dorsiflexion      Ankle plantarflexion      Ankle inversion      Ankle eversion      At Evaluation all strength testing is grossly seated and functionally standing / gait. (Blank rows = not tested)  BED MOBILITY:  Evaluation on 03/05/2024 Sit to supine Complete Independence Supine to sit Complete  Independence Rolling to Right Complete Independence  TRANSFERS: 06/28/2024:  sit to/from stand 18 chair with armrests to cane stand alone tip safely.  04/10/2024: Pt able stand to/from sit into chair without armrest using BUEs on seat and RW for stabilization with supervision/verbal cues.  Eval: Sit to stand: supervision requires a stable object to arise, stabilization w/ walker using UE support, with delayed weight shift onto the prosthesis for stabilization, and uses UE to bring Lt prosthesis back below his body  Stand to sit: supervision requires a stable object to arise, stabilization w/ walker using UE support, with delayed weight shift onto the prosthesis for stabilization  FUNCTIONAL TESTs:  06/26/2024:  Timed Up & Go with cane with CGA 33.85sec. Berg Balance [id number]  [facility] PT Assessment - 06/26/24 1345       Berg Balance Test   Sit to Stand Able to stand  independently using hands    Standing Unsupported Able to stand safely 2 minutes    Sitting with Back Unsupported but Feet Supported on Floor or Stool Able to sit safely and securely 2 minutes    Stand to Sit Controls descent by using hands    Transfers Able to transfer safely, minor use of hands    Standing Unsupported with Eyes Closed Able to stand 10 seconds with supervision    Standing Unsupported with Feet Together Able to place feet together independently and stand  for 1 minute with supervision    From Standing, Reach Forward with Outstretched Arm Can reach confidently >25 cm (10)    From Standing Position, Pick up Object from Floor Able to pick up shoe, needs supervision    From Standing Position, Turn to Look Behind Over each Shoulder Turn sideways only but maintains balance    Turn 360 Degrees Needs assistance while turning   needs minA without device for safety.   Standing Unsupported, Alternately Place Feet on Step/Stool Able to complete >2 steps/needs minimal assist    Standing Unsupported, One Foot in Front Able to  plae foot ahead of the other independently and hold 30 seconds    Standing on One Leg Tries to lift leg/unable to hold 3 seconds but remains standing independently    Total Score 38    Berg comment: BERG  < 36 high risk for falls (close to 100%) 46-51 moderate (>50%)   37-45 significant (>80%) 52-55 lower (> 25%)         BERG: 06/14/2024 31/56 performed with RW in front of patient and intermittently using RW during breaks   04/10/2024: Patient able to reach 10 forward and look over his shoulders with single upper extremity support on RW.   Eval:  BERG score 11/56 05/31/2024: performed with standing rest breaks at RW, but performed below tasks without RW   [facility] PT Assessment - 05/31/24 0001       Berg Balance Test   Sit to Stand Able to stand using hands after several tries    Standing Unsupported Able to stand 2 minutes with supervision    Sitting with Back Unsupported but Feet Supported on Floor or Stool Able to sit safely and securely 2 minutes    Stand to Sit Uses backs of legs against chair to control descent    Transfers Able to transfer safely, definite need of hands    Standing Unsupported with Eyes Closed Able to stand 10 seconds with supervision    Standing Unsupported with Feet Together Able to place feet together independently and stand for 1 minute with supervision    From Standing, Reach Forward with Outstretched Arm Reaches forward but needs supervision    From Standing Position, Pick up Object from Floor Able to pick up shoe, needs supervision    From Standing Position, Turn to Look Behind Over each Shoulder Needs supervision when turning    Turn 360 Degrees Needs assistance while turning    Standing Unsupported, Alternately Place Feet on Step/Stool Needs assistance to keep from falling or unable to try    Standing Unsupported, One Foot in Front Able to plae foot ahead of the other independently and hold 30 seconds    Standing on One Leg Tries to lift leg/unable to hold 3  seconds but remains standing independently    Total Score 29    Berg comment: BERG < 36 high risk for falls (close to 100%) 46-51 moderate (>50%) 37-45 significant (>80%) 52-55 lower (> 25%)         CARDIOVASCULAR RESPONSE: Functional activity: gait with RW & cane noted below Pre-activity vitals: HR: 94 SpO2: 98% Post-activity vitals: HR: 107 (took only 1 min to recover to baseline) SpO2: 96%   GAIT: 06/28/2024: Patient amb >300' with RW & prosthesis modified independent. Gait velocity with RW: self-selected pace 1.64 ft/sec & fast pace 2.29 ft/sec Patient neg ramp & curb with RW & prosthesis modified independent.  Patient is now accessing community with RW & prosthesis  instead of w/c.   Pt amb 150' with cane stand alone tip & AKA prosthesis with min/CGA.  Gait velocity with cane: self-selected pace 1.44 ft/sec & fast pace 2.02 ft/sec Pt neg ramp & curb with cane stand alone tip & AKA prosthesis with minA & cues.   05/31/2024 Gait Speed:  Self-selected: 1.59 ft/sec Fast-as-comfortable: 2.29 ft/sec    04/10/2024: Patient ambulated 125' with RW and supervision.   Patient negotiated 12*ramp and 6.5 curb with RW and supervision  Eval: Gait pattern: step to pattern, decreased step length- Right, decreased stance time- Left, decreased hip/knee flexion- Left, circumduction- Left, Left hip hike, antalgic, trendelenburg, lateral lean- Right, trunk flexed, abducted- Left, and poor foot clearance- Left Distance walked: 62 ft Assistive device utilized: Walker - 2 wheeled and AKA prosthesis Level of assistance: SBA / steady with RW support but needs cues  Gait velocity: 0.72 ft/sec Comments: excessive weightbearing on BUEs using RW   CURRENT PROSTHETIC WEAR ASSESSMENT: 06/26/2024:  Pt is wearing prosthesis all awake hours with no skin issues but limb pain 2/10-6/10 normally.   He is independent with donning, doffing, skin check, residual limb care, care of non-amputated limb,  prosthetic cleaning, ply sock cleaning, correct ply sock adjustment, proper wear schedule/adjustment, and proper weight-bearing schedule/adjustment  Evaluation:   Patient is dependent with: skin check, residual limb care, care of non-amputated limb, prosthetic cleaning, ply sock cleaning, correct ply sock adjustment, proper wear schedule/adjustment, and proper weight-bearing schedule/adjustment Donning prosthesis: SBA / verbal cues Doffing prosthesis: Complete Independence Prosthetic wear tolerance: 6-8 hours, 1x/day, 7 days/week Prosthetic weight bearing tolerance: tolerated standing for 10 minutes with partial weight on prosthesis during BERG balance test with no increased pain. Although he reports standing pain with activities at home. Edema: minimal Residual limb condition: 3x20mm superficial wound distally located, scar with mild adherence, no adductor roll, normal skin color, decreased hair growth present Prosthetic description: silicone liner with suction ring suspension, ischial containment socket with flexible inner socket, rotator unit, SAFETY (single axis friction engaging with an extension assist), flexible keel foot  TODAY'S TREATMENT:                                                                                                                    DATE:  07/10/2024 Prosthetic training with transfemoral prosthesis: pt has wound (open blister ~1cm) inside lateral distal invaginated area. He has 2 distal deep invaginated area.  Blister is probably from sweat be trapped in area and unable to get air between invagination & nonbreathing liner.  PT cut Vivewear strips to roll to place in invagination and covered with 3XL Vivewear liner-liner.  PT educated in care. Pt and wife verbalized understanding.  Pt reports no pain with weight bearing after this.   PT demo & verbal cues on sit to/from stand with cane.  Pt able to perform 5 reps with supervision.   Pt amb 30' & 120' with cane stand alone  tip with minA. Patient's prosthetic foot scuffs RLE while advancing prosthesis.  It appears to from not firing his left hip abductors and with right lateral lean to cane causes the prosthesis to drift inward.  Concern is this could throw off the timing so that prosthetic foot not in position to facilitate knee ext / control with weight acceptance.   PT demo & verbal cues on seated isometric hip add & single LE hip abd (focus on keeping towel roll midline while firing ea side hip abductors.  Set-up is towel roll or small ball bw LEs and belt snug around both legs.  Pt performed 10 reps and verbalized understanding.      TREATMENT:                                                                                                                    DATE: 06/26/2024 Prosthetic training with transfemoral prosthesis: See gait in objective data Pt amb 120' with cane stand alone tip scanning right/left and up/down with minA. PT demo & verbal cues on stepping over obstacle.  Pt stepped over 3 foam roll with cane stand alone tip with mod/HHA initial 2 reps, then modA / no HHA 2 reps. PT demo & verbal cues how to pick up object from floor using prosthesis for stability.  Pt able to pick up item 3 reps with minA & cues.  Pt able to pick up 3 reps with RW with supervision. PT discussed high risk of injury with performing activities on ladder.  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  Pt verbalized understanding.       TREATMENT:                                                                                                                    DATE: 06/21/2024 Prosthetic training with transfemoral prosthesis: sit to/from stand 18 chair without armrests using RUE on seat with cane for stabilization with supervision.   Patient ambulated 26' x 2 with cane stand-alone tip with min/CGA. 2nd rep carrying open bottle of water  with no spills but one misstep  requiring minA to balance.   PT cues on upright posture, step length and weight shift over prosthesis in stance. Pt neg stairs with single rail & cane (4 steps left rail / RUE cane & 4 steps right rail / LUE cane) with verbal cues and CGA.    Neuromuscular reeducation working on standing balance Standing balance with upper extremity resistance using green Thera-Band to facilitate core stabilization and  weightbearing through both lower extremities.  PT min/CGA with tactile cues and verbal cues for upright posture and balance reactions.  Single upper extremity alternating sides 5 reps each and bilateral upper extremities 10 reps: Row, forward reach and biceps curl/upward reach. PT demo & verbal cues & HO for HEP including safe set-up & use of bands. Pt verbalized understanding after performing during today's session.     TREATMENT:                                                                                                                    DATE: 06/19/2024 Prosthetic training with transfemoral prosthesis: PT reviewed recommendations for prosthetist at appointment tomorrow to perform a dynamic alignment and modify the socket to address pain issues.  Patient verbalized understanding. Patient ambulated 50' x 2 with cane stand-alone tip with min/CGA.  PT cues on upright posture, step length and weight shift over prosthesis in stance. PT demo and verbal cues on negotiating curb with a cane and prosthesis.  Patient negotiated 6.5 curb 2 reps with min to modA.    Neuromuscular reeducation working on standing balance Standing balance with upper extremity resistance using green Thera-Band to facilitate core stabilization and weightbearing through both lower extremities.  PT min/CGA with tactile cues and verbal cues for upright posture and balance reactions.  Single upper extremity alternating sides 5 reps each and bilateral upper extremities 10 reps: Row, forward reach and biceps curl/upward  reach.    HOME EXERCISE PROGRAM: Access Code: [email or web address] URL: [email or web address] Date: 06/21/2024 Prepared by: Grayce Spatz  Exercises - Modified Thomas Stretch  - 2 x daily - 7 x weekly - 1 sets - 2-3 reps - 30 seconds hold - Hip Flexor Stretch at Edge of Bed  - 2 x daily - 7 x weekly - 1 sets - 2-3 reps - 30 seconds hold - Standing posture with back to counter  - 2 x daily - 7 x weekly - 1 sets - 1 reps - 5-10 minutes hold - Upright Stance at Door Frame Single Arm  - 3-6 x daily - 7 x weekly - 1 sets - 2 reps - 2 deep breathes hold - Upright Stance at Door Frame with Both Arms  - 3-6 x daily - 7 x weekly - 1 sets - 2 reps - 2 deep breathes hold - Alternating Punch with Resistance  - 1 x daily - 4-5 x weekly - 1 sets - 10 reps - 5 seconds hold - Standing Scapular Protraction with Resistance  - 1 x daily - 4-5 x weekly - 1 sets - 10 reps - 5 seconds hold - Standing alternate rows with resistance  - 1 x daily - 4-5 x weekly - 1 sets - 10 reps - 5 seconds hold - Standing Row with Anchored Resistance  - 1 x daily - 4-5 x weekly - 1 sets - 10 reps - 5 seconds hold - Alternating elbow flexion with resistance  - 1 x daily - 4-5 x weekly -  1 sets - 10 reps - 5 seconds hold - Standing Bicep Curls with Resistance  - 1 x daily - 4-5 x weekly - 1 sets - 10 reps - 5 seconds hold  Do each exercise 1-2  times per day Do each exercise 5-10 repetitions Hold each exercise for 2 seconds to feel your location  AT SINK FIND YOUR MIDLINE POSITION AND PLACE FEET EQUAL DISTANCE FROM THE MIDLINE.  Try to find this position when standing still for activities.   USE TAPE ON FLOOR TO MARK THE MIDLINE POSITION which is even with middle of sink.  You also should try to feel with your limb pressure in socket.  You are trying to feel with limb what you used to feel with the bottom of your foot.  Side to Side Shift: Moving your hips only (not shoulders): move weight onto your left leg, HOLD/FEEL  pressure in socket.  Move back to equal weight on each leg, HOLD/FEEL pressure in socket. Move weight onto your right leg, HOLD/FEEL pressure in socket. Move back to equal weight on each leg, HOLD/FEEL pressure in socket. Repeat.  Start with both hands on sink, progress to hand on prosthetic side only, then no hands.  Front to Back Shift: Moving your hips only (not shoulders): move your weight forward onto your toes, HOLD/FEEL pressure in socket. Move your weight back to equal Flat Foot on both legs, HOLD/FEEL  pressure in socket. Move your weight back onto your heels, HOLD/FEEL  pressure in socket. Move your weight back to equal on both legs, HOLD/FEEL  pressure in socket. Repeat.  Start with both hands on sink, progress to hand on prosthetic side only, then no hands.  Moving Cones / Cups: With equal weight on each leg: Hold on with one hand the first time, then progress to no hand supports. Move cups from one side of sink to the other. Place cups ~2" out of your reach, progress to 10" beyond reach.  Place one hand in middle of sink and reach with other hand. Do both arms.  Then hover one hand and move cups with other hand.  Overhead/Upward Reaching: alternated reaching up to top cabinets or ceiling if no cabinets present. Keep equal weight on each leg. Start with one hand support on counter while other hand reaches and progress to no hand support with reaching.  ace one hand in middle of sink and reach with other hand. Do both arms.  Then hover one hand and move cups with other hand.  5.   Looking Over Shoulders: With equal weight on each leg: alternate turning to look over your shoulders with one hand support on counter as needed.  Start with head motions only to look in front of shoulder, then even with shoulder and progress to looking behind you. To look to side, move head /eyes, then shoulder on side looking pulls back, shift more weight to side looking and pull hip back. Place one hand in middle of sink  and let go with other hand so your shoulder can pull back. Switch hands to look other way.   Then hover one hand and look over shoulder. If looking right, use left hand at sink. If looking left, use right hand at sink. 6.  Stepping with leg that is not amputated:  Move items under cabinet out of your way. Shift your hips/pelvis so weight on prosthesis. Tighten muscles in hip on prosthetic side.  SLOWLY step other leg so front of foot is  in cabinet. Then step back to floor.    ASSESSMENT:  CLINICAL IMPRESSION:    Patient's wound appears to be from blister with sweat trapped in liner.  He appears to understand PT recommendations for care.  Pt appears to understand new exercise.    OBJECTIVE IMPAIRMENTS: Abnormal gait, decreased activity tolerance, decreased balance, decreased endurance, decreased knowledge of condition, decreased knowledge of use of DME, decreased mobility, difficulty walking, decreased ROM, decreased strength, impaired flexibility, postural dysfunction, prosthetic dependency , and pain.   ACTIVITY LIMITATIONS: carrying, lifting, bending, sitting, standing, squatting, stairs, transfers, bathing, toileting, dressing, reach over head, and locomotion level  PARTICIPATION LIMITATIONS: meal prep, cleaning, laundry, driving, shopping, community activity, and yard work  PERSONAL FACTORS: Age, Fitness, Past/current experiences, Time since onset of injury/illness/exacerbation, and 3+ comorbidities: see PMH are also affecting patient's functional outcome.   REHAB POTENTIAL: Good  CLINICAL DECISION MAKING: Evolving/moderate complexity  EVALUATION COMPLEXITY: Moderate   GOALS: Goals reviewed with patient? Yes  SHORT TERM GOALS: Target date: 04/05/2024  Patient donnes prosthesis modified independent & verbalizes proper cleaning. Baseline: SEE OBJECTIVE DATA Goal status: MET, 04/10/2024  2.  Patient tolerates prosthesis >12 hrs total /day without skin issues or limb pain >7/10 after  standing. Baseline: patient reports wearing 4-5 hr/day Goal status: GOAL MET, 70/23/2025  3.  Patient able to reach 7 and look over both shoulders with RW support with Modified independent. Baseline: SEE OBJECTIVE DATA Goal status: MET, 04/10/2024  4. Patient ambulates 125' with RW & prosthesis with supervision. Baseline: SEE OBJECTIVE DATA Goal status: MET, 04/10/2024  5. Patient negotiates ramps & curbs with RW & prosthesis with supervision. Baseline: SEE OBJECTIVE DATA Goal status: MET, 04/10/2024  LONG TERM GOALS: Target date: 08/23/2024  Patient demonstrates & verbalized understanding of prosthetic care to enable safe utilization of prosthesis. Baseline: SEE OBJECTIVE DATA Goal status: MET 05/29/2024  Patient tolerates prosthesis wear >90% of awake hours without skin issues or limb pain >4/10. Baseline: SEE OBJECTIVE DATA wearing most of awake hours without skin issues but having limb pain.  Goal status: Ongoing     07/10/2024  BERG >/= 25/56 to indicate lower fall risk Baseline: SEE OBJECTIVE DATA Goal status: GOAL MET, 05/31/2024  Patient ambulates >300' with prosthesis and cane modified independently Baseline: SEE OBJECTIVE DATA Goal status: Ongoing    07/10/2024  Patient negotiates ramps, curbs & stairs with single rail with prosthesis and cane independently. Baseline: SEE OBJECTIVE DATA Goal status: Ongoing   07/10/2024  6.  Patient reports Patient-Specific Activity Score improved average >7 to indicate improvement in functional activities.   Baseline: SEE OBJECTIVE DATA Goal status: Ongoing   07/10/2024  PLAN:  PT FREQUENCY: 1-2x/week  PT DURATION: 12 weeks    PLANNED INTERVENTIONS: 97164- PT Re-evaluation, 97750- Physical Performance Testing, 97110-Therapeutic exercises, 97530- Therapeutic activity, 97112- Neuromuscular re-education, 97535- Self Care, [id number]- Manual therapy, [phone]- Gait training, [phone]- Prosthetic Initial , [phone]- Orthotic/Prosthetic subsequent,  Patient/Family education, Balance training, Stair training, Joint mobilization, and DME instructions  PLAN FOR NEXT SESSION: work on prosthesis advancement engaging hip abductors,  Check on HEP for balance with green Thera-Band, posture, gait with cane including carrying items.     Grayce Spatz, PT, DPT 07/10/2024, 3:56 PM    Date of referral: 02/13/2024  Referring provider: Gladis Lauraine BRAVO, NP Referring diagnosis? S10.387 (ICD-10-CM) - Hx of AKA (above knee amputation), left (HCC)  Treatment diagnosis? (if different than referring diagnosis) R26.89, M62.81, M79.605, M25.652, R29.3, R26.81, L98.498  What was this (referring dx)  caused by? Other: Lt AKA  vascular condition  Nature of Condition: Chronic (continuous duration > 3 months)  Prosthesis delivery 02/20/2024   Laterality: Lt  Current Functional Measure Score: Patient Specific Functional Scale Score:  5.66    Berg on 06/26/24 38/56  (on 03/05/2024 was 11/56)   score <45/56 indicates high fall risk but 27 point improvement is significant.   Objective measurements identify impairments when they are compared to normal values, the uninvolved extremity, and prior level of function.  [x]  Yes  []  No  Objective assessment of functional ability: Moderate functional limitations   Briefly describe symptoms: Patient is wearing prosthesis most of awake hours without skin issues but is having limb pain.  He is accessing community with walker & prosthesis but is limited.  He is dependent for gait with cane but has potential to use with skilled care.  Patient has improved balance - Berg on 06/26/24 38/56  (on 03/05/2024 was 11/56)   score <45/56 indicates high fall risk but 27 point improvement is significant.   How did symptoms start: amputation due to vascular issues  Average pain intensity:  Last 24 hours: 3-/10  Past week: 3-6/10  How often does the pt experience symptoms? Frequently  How much have the symptoms interfered with usual  daily activities? Quite a bit  How has condition changed since care began at this facility? Better  In general, how is the patients overall health? Good   BACK PAIN (STarT Back Screening Tool) No

## 2024-07-12 ENCOUNTER — Encounter: Payer: Self-pay | Admitting: Physical Therapy

## 2024-07-12 ENCOUNTER — Ambulatory Visit: Admitting: Physical Therapy

## 2024-07-12 DIAGNOSIS — M6281 Muscle weakness (generalized): Secondary | ICD-10-CM

## 2024-07-12 DIAGNOSIS — M25652 Stiffness of left hip, not elsewhere classified: Secondary | ICD-10-CM

## 2024-07-12 DIAGNOSIS — R2689 Other abnormalities of gait and mobility: Secondary | ICD-10-CM

## 2024-07-12 DIAGNOSIS — M79605 Pain in left leg: Secondary | ICD-10-CM | POA: Diagnosis not present

## 2024-07-12 DIAGNOSIS — L98498 Non-pressure chronic ulcer of skin of other sites with other specified severity: Secondary | ICD-10-CM

## 2024-07-12 DIAGNOSIS — R2681 Unsteadiness on feet: Secondary | ICD-10-CM

## 2024-07-12 DIAGNOSIS — R293 Abnormal posture: Secondary | ICD-10-CM

## 2024-07-12 NOTE — Therapy (Signed)
 OUTPATIENT PHYSICAL THERAPY PROSTHETIC TREATMENT   Patient Name: Mike Floyd MRN: [id number] DOB:1960-12-30, 63 y.o., male Today's Date: 07/12/2024  END OF SESSION:  PT End of Session - 07/12/24 1301     Visit Number 23    Number of Visits 40    Date for Recertification  08/23/24    Authorization Type [facility] MEDICARE    Authorization Time Period [facility] MEDICARE $45 COPAY 06/28/2024 - 09/06/2024 10 VISITS    Authorization - Visit Number 2    Authorization - Number of Visits 10    Progress Note Due on Visit 30    PT Start Time 1301    PT Stop Time 1345    PT Time Calculation (min) 44 min    Equipment Utilized During Treatment Gait belt    Activity Tolerance Patient tolerated treatment well;Patient limited by fatigue    Behavior During Therapy WFL for tasks assessed/performed               Past Medical History:  Diagnosis Date   AICD (automatic cardioverter/defibrillator) present 11/2007   Removed in 2018, a. 11/2007 [facility] Current VR - single lead ICD  - Removed 2018 - it was burning me   Anxiety    CAD (coronary artery disease)    non-obstructive CAD by Cor CT in 2020 // Myoview  3/22: EF 57, small inf-sept defect c/w scar, no ischemia; low risk      Chest pain    a. 10/2007 Cath:  normal Cors.   CKD (chronic kidney disease), stage II    DDD (degenerative disc disease), lumbar    Diabetes mellitus DX: 2010   type 2   Erosive esophagitis    a. per EGD (08/2011), Dr. Golda - Erosive reflux esophagitis improved but not completely healed since previous EGD 3 years ago. Bx showing  ulcerated gatroesophageal junction mucosa. negative for H. pylori   GERD (gastroesophageal reflux disease)    Gout    Hearing deficit    a. wear bilateral hearing aides   History of hiatal hernia    History of kidney stones    passed stones, no surgery   Hypertension    Mildly dilatd aortic root (HCC)    CMR 4/22: EF 52, no LGE; d/w Dr. Ione root 38 mm (mildly dilated)   Myocardial  infarction [facility]) 2011   Neuropathy    Feet and legs   Nonischemic dilated cardiomyopathy (HCC)    a. H/O EF as low as 35-40% by LV gram 10/2007;  b. Echo 02/2011 EF 50-55%, inf HK, Gr 1 DD // CMR 4/22: EF 52, no LGE; d/w Dr. Ione root 38 mm (mildly dilated)    Renal insufficiency    Sleep apnea    pt doesnt use, statesI cant afford one. PCP aware   Stroke (HCC)    mini-stroke in 2014   TIA (transient ischemic attack)    July, 2013   Tobacco abuse, in remission 06/27/2009   Discontinued in 2009     Wears dentures    top plate   WPW (Wolff-Parkinson-White syndrome)    a. s/p RFCA @ [facility] - 1999   Past Surgical History:  Procedure Laterality Date   AMPUTATION Left 05/19/2022   Procedure: TRANSMETATARSAL AMPUTATION LEFT FOOT;  Surgeon: Harden Jerona GAILS, MD;  Location: [facility] OR;  Service: Orthopedics;  Laterality: Left;   AMPUTATION Left 06/17/2023   Procedure: LEFT BELOW KNEE AMPUTATION;  Surgeon: Harden Jerona GAILS, MD;  Location: [facility] OR;  Service: Orthopedics;  Laterality: Left;   AMPUTATION Left 08/19/2023   Procedure: LEFT ABOVE KNEE AMPUTATION;  Surgeon: Harden Jerona GAILS, MD;  Location: [facility] OR;  Service: Orthopedics;  Laterality: Left;   APPENDECTOMY     BACK SURGERY     1995   CARDIAC CATHETERIZATION     2009   CARDIAC DEFIBRILLATOR PLACEMENT     ICD was removed in 2018   CHOLECYSTECTOMY     CHOLECYSTECTOMY, LAPAROSCOPIC     11/2007   COLONOSCOPY W/ POLYPECTOMY  2009   ELBOW SURGERY Left 06/2010   ESOPHAGEAL DILATION N/A 11/08/2014   Procedure: ESOPHAGEAL DILATION;  Surgeon: Claudis RAYMOND Rivet, MD;  Location: [facility];  Service: Endoscopy;  Laterality: N/A;  #56,    ESOPHAGOGASTRODUODENOSCOPY  03/31/2012   also 08/2011; [name]   ESOPHAGOGASTRODUODENOSCOPY (EGD) WITH PROPOFOL  N/A 11/08/2014   Procedure: ESOPHAGOGASTRODUODENOSCOPY (EGD) WITH PROPOFOL ;  Surgeon: Claudis RAYMOND Rivet, MD;  Location: [facility];  Service: Endoscopy;  Laterality: N/A;  Hiatus is 45 , GE Junction is 37   FOOT  ARTHRODESIS Left 10/24/2020   Procedure: LEFT GASTROCNEMIUS RECESSION, DORSIFLEXION OSTEOTOMY 1ST MT;  Surgeon: Harden Jerona GAILS, MD;  Location: [facility] OR;  Service: Orthopedics;  Laterality: Left;   FOOT ARTHRODESIS Right 01/09/2021   Procedure: CLOSING WEDGE OSTEOTOMY RIGHT 1ST METATARSAL;  Surgeon: Harden Jerona GAILS, MD;  Location: [facility] OR;  Service: Orthopedics;  Laterality: Right;   GASTROCNEMIUS RECESSION Right 01/09/2021   Procedure: RIGHT GASTROCNEMIUS RECESSION;  Surgeon: Harden Jerona GAILS, MD;  Location: [facility] OR;  Service: Orthopedics;  Laterality: Right;   ICD LEAD REMOVAL N/A 10/07/2016   Procedure: ICD LEAD REMOVAL ;  Surgeon: Danelle LELON Birmingham, MD;  Location: [facility] OR;  Service: Cardiovascular;  Laterality: N/A;   MULTIPLE TOOTH EXTRACTIONS     RADIOFREQUENCY ABLATION  1999   WPW; performed at [facility]   STUMP REVISION Left 07/22/2023   Procedure: REVISION LEFT BELOW KNEE AMPUTATION;  Surgeon: Harden Jerona GAILS, MD;  Location: [facility] OR;  Service: Orthopedics;  Laterality: Left;   TEE WITHOUT CARDIOVERSION N/A 10/07/2016   Procedure: TRANSESOPHAGEAL ECHOCARDIOGRAM (TEE);  Surgeon: Danelle LELON Birmingham, MD;  Location: [facility] OR;  Service: Cardiovascular;  Laterality: N/A;   Patient Active Problem List   Diagnosis Date Noted   Dyslipidemia 11/03/2023   Essential hypertension 11/03/2023   Type 2 diabetes mellitus with peripheral neuropathy (HCC) 11/03/2023   BPH (benign prostatic hyperplasia) 11/03/2023   GERD without esophagitis 11/03/2023   S/P AKA (above knee amputation) unilateral, left (HCC) 08/19/2023   Dehiscence of amputation stump of left lower extremity (HCC) 07/22/2023   Ischemia of site of left below knee amputation (HCC) 07/22/2023   S/P BKA (below knee amputation) unilateral, left (HCC) 07/22/2023   Below-knee amputation of left lower extremity (HCC) 06/17/2023   Acute osteomyelitis of metatarsal bone of left foot (HCC)    Subacute osteomyelitis, left ankle and foot (HCC) 05/13/2022   Non-pressure  chronic ulcer of other part of right foot limited to breakdown of skin (HCC)    Contracture of right Achilles tendon    Mildly dilatd aortic root (HCC)    Contracture of left Achilles tendon    Metatarsal deformity, left    CAD (coronary artery disease)    Hallux hammertoe, left 12/11/2019   Diarrhea 06/27/2019   Diastasis recti 08/22/2018   Umbilical hernia without obstruction and without gangrene 08/22/2018   Vitamin D  deficiency 06/23/2017   Essential hypertension, benign 06/15/2017   Mixed hyperlipidemia 06/15/2017   Class 1 obesity  due to excess calories with serious comorbidity and body mass index (BMI) of 31.0 to 31.9 in adult 06/15/2017   Malfunction of implantable cardioverter-defibrillator (ICD) electrode 10/07/2016   Chronic kidney disease (CKD), stage III (moderate) (HCC) 04/04/2015   Bladder neck obstruction 03/21/2015   Difficult or painful urination 03/21/2015   Flank pain 03/21/2015   Delayed onset of urination 03/21/2015   Calculus of kidney 03/21/2015   Erosive esophagitis 12/27/2013   Hypotension due to drugs 07/02/2013   Chronic systolic heart failure (HCC) 04/24/2013   CAP (community acquired pneumonia) 03/16/2013   HTN (hypertension) 03/15/2013   Hydrocele 03/15/2013   Spermatocele 03/15/2013   DOE (dyspnea on exertion) 03/15/2013   OSA (obstructive sleep apnea) 01/09/2013   Chronic kidney disease, stage 3 (HCC) 01/03/2013   Chest pain 12/26/2012   History of diagnostic tests 09/06/2012   TIA (transient ischemic attack)    Cardiomyopathy, nonischemic (HCC) 02/24/2010   AICD (automatic cardioverter/defibrillator) 02/24/2010   DM type 2 causing vascular disease (HCC) 06/27/2009   Gout 06/27/2009   Tobacco abuse, in remission 06/27/2009   COLONIC POLYPS 10/31/2008   GERD (gastroesophageal reflux disease) 10/31/2008    PCP: Gladis Lauraine BRAVO, NP  REFERRING PROVIDER: Harden Jerona GAILS, MD  REFERRING DIAG: [phone] (ICD-10-CM) - Hx of AKA (above knee  amputation), left (HCC)   THERAPY DIAG:  Other abnormalities of gait and mobility  Muscle weakness (generalized)  Stiffness of left hip, not elsewhere classified  Pain in left leg  Abnormal posture  Unsteadiness on feet  Non-pressure chronic ulcer of skin of other sites with other specified severity [facility]  Rationale for Evaluation and Treatment: Rehabilitation  ONSET DATE: Prosthesis delivery 02/20/2024  SUBJECTIVE:   SUBJECTIVE STATEMENT:   He has been using Vivewear liner-liner as PT recommended.  His limb feels better when using it.     PERTINENT HISTORY: DM2, GERD, hypertension, dyslipidemia, CAD, L BKA (06/17/23), neuropathy  Eval: Patient is a 63 year old male who presents to physical therapy with a left AKA (08/19/2023) with a new prosthesis delivery on 02/20/2024. Patient had previous home health PT prior to Lt AKA. He is able to mobilize with modified independence w/ walker. Wears prosthesis about ten hours a day and by the end of the night he takes it off due to fatigue and pain.   PAIN:  Are you having pain? Yes: NPRS scale:  1/10 today, 2/10 at worst since last PT Pain location: Femur in prosthesis and pain in the Rt leg as well Pain description: feels like its been hammered Aggravating factors: keeping prosthesis on for long periods of times, sitting Relieving factors: taking off prosthesis and medications  PRECAUTIONS: Fall  RED FLAGS: None   WEIGHT BEARING RESTRICTIONS: No  FALLS:  Has patient fallen in last 6 months? Yes. Number of falls 2, forgetting he doesn't have a leg and put weight onto the left side. He did not hit the floor, only fell into another person. Occurred when transferring from sitting to standing  LIVING ENVIRONMENT: Lives with: lives with their spouse Lives in: Other Trailer on grass Stairs: Yes: External: 4 steps; can reach both Has following equipment at home: Single point cane, Environmental Consultant - 2 wheeled, Wheelchair (manual), and shower  chair  OCCUPATION: retired   PLOF: Independent with household mobility with device  PATIENT GOALS: ambulating and ADLs with cane   NEXT MD VISIT: Office MD visit: 03/21/2024   OBJECTIVE:  Patient-Specific Activity Scoring Scheme  0 represents "unable to perform." 10 represents "able  to perform at prior level. 0 1 2 3 4 5 6 7 8 9  10 (Date and Score)   Activity Eval  06/19/24   1. Walking w/ cane  2 3   2. Standing for ADLs  5  7  3. Stairs 5 7  4.    5.    Score 4 5.66   Total score = sum of the activity scores/number of activities Minimum detectable change (90%CI) for average score = 2 points Minimum detectable change (90%CI) for single activity score = 3 points  DIAGNOSTIC FINDINGS: none post-amputation  COGNITION: Overall cognitive status: Within functional limits for tasks assessed   SENSATION: WFL  MUSCLE LENGTH:   POSTURE: rounded shoulders, forward head, flexed trunk , and weight shift right  LOWER EXTREMITY ROM:  ROM P:passive  A:active Left eval 06/14/24 06/28/24  Hip flexion     Hip extension Thomas position: P: - 40* P: -27 Supine Thomas Left/right -22*/-17*  Hip abduction     Hip adduction     Hip internal rotation     Hip external rotation     Knee flexion     Knee extension     Ankle dorsiflexion     Ankle plantarflexion     Ankle inversion     Ankle eversion      (Blank rows = not tested)  LOWER EXTREMITY MMT:  MMT Right eval Left eval Right 06/28/24 Left 06/28/24  Hip flexion 3/5 3/5 4/5 4-/5  Hip extension 3/5 3-/5 4/5 4-/5  Hip abduction  3-/5  4-/5  Hip adduction      Hip internal rotation      Hip external rotation      Knee flexion      Knee extension 3/5  4/5   Ankle dorsiflexion      Ankle plantarflexion      Ankle inversion      Ankle eversion      At Evaluation all strength testing is grossly seated and functionally standing / gait. (Blank rows = not tested)  BED MOBILITY:  Evaluation on 03/05/2024 Sit  to supine Complete Independence Supine to sit Complete Independence Rolling to Right Complete Independence  TRANSFERS: 06/28/2024:  sit to/from stand 18 chair with armrests to cane stand alone tip safely.  04/10/2024: Pt able stand to/from sit into chair without armrest using BUEs on seat and RW for stabilization with supervision/verbal cues.  Eval: Sit to stand: supervision requires a stable object to arise, stabilization w/ walker using UE support, with delayed weight shift onto the prosthesis for stabilization, and uses UE to bring Lt prosthesis back below his body  Stand to sit: supervision requires a stable object to arise, stabilization w/ walker using UE support, with delayed weight shift onto the prosthesis for stabilization  FUNCTIONAL TESTs:  06/26/2024:  Timed Up & Go with cane with CGA 33.85sec. Berg Balance [id number]  [facility] PT Assessment - 06/26/24 1345       Berg Balance Test   Sit to Stand Able to stand  independently using hands    Standing Unsupported Able to stand safely 2 minutes    Sitting with Back Unsupported but Feet Supported on Floor or Stool Able to sit safely and securely 2 minutes    Stand to Sit Controls descent by using hands    Transfers Able to transfer safely, minor use of hands    Standing Unsupported with Eyes Closed Able to stand 10 seconds with supervision    Standing Unsupported  with Feet Together Able to place feet together independently and stand for 1 minute with supervision    From Standing, Reach Forward with Outstretched Arm Can reach confidently >25 cm (10)    From Standing Position, Pick up Object from Floor Able to pick up shoe, needs supervision    From Standing Position, Turn to Look Behind Over each Shoulder Turn sideways only but maintains balance    Turn 360 Degrees Needs assistance while turning   needs minA without device for safety.   Standing Unsupported, Alternately Place Feet on Step/Stool Able to complete >2 steps/needs minimal  assist    Standing Unsupported, One Foot in Front Able to plae foot ahead of the other independently and hold 30 seconds    Standing on One Leg Tries to lift leg/unable to hold 3 seconds but remains standing independently    Total Score 38    Berg comment: BERG  < 36 high risk for falls (close to 100%) 46-51 moderate (>50%)   37-45 significant (>80%) 52-55 lower (> 25%)         BERG: 06/14/2024 31/56 performed with RW in front of patient and intermittently using RW during breaks   04/10/2024: Patient able to reach 10 forward and look over his shoulders with single upper extremity support on RW.   Eval:  BERG score 11/56 05/31/2024: performed with standing rest breaks at RW, but performed below tasks without RW   [facility] PT Assessment - 05/31/24 0001       Berg Balance Test   Sit to Stand Able to stand using hands after several tries    Standing Unsupported Able to stand 2 minutes with supervision    Sitting with Back Unsupported but Feet Supported on Floor or Stool Able to sit safely and securely 2 minutes    Stand to Sit Uses backs of legs against chair to control descent    Transfers Able to transfer safely, definite need of hands    Standing Unsupported with Eyes Closed Able to stand 10 seconds with supervision    Standing Unsupported with Feet Together Able to place feet together independently and stand for 1 minute with supervision    From Standing, Reach Forward with Outstretched Arm Reaches forward but needs supervision    From Standing Position, Pick up Object from Floor Able to pick up shoe, needs supervision    From Standing Position, Turn to Look Behind Over each Shoulder Needs supervision when turning    Turn 360 Degrees Needs assistance while turning    Standing Unsupported, Alternately Place Feet on Step/Stool Needs assistance to keep from falling or unable to try    Standing Unsupported, One Foot in Front Able to plae foot ahead of the other independently and hold 30 seconds     Standing on One Leg Tries to lift leg/unable to hold 3 seconds but remains standing independently    Total Score 29    Berg comment: BERG < 36 high risk for falls (close to 100%) 46-51 moderate (>50%) 37-45 significant (>80%) 52-55 lower (> 25%)         CARDIOVASCULAR RESPONSE: Functional activity: gait with RW & cane noted below Pre-activity vitals: HR: 94 SpO2: 98% Post-activity vitals: HR: 107 (took only 1 min to recover to baseline) SpO2: 96%   GAIT: 06/28/2024: Patient amb >300' with RW & prosthesis modified independent. Gait velocity with RW: self-selected pace 1.64 ft/sec & fast pace 2.29 ft/sec Patient neg ramp & curb with RW & prosthesis modified  independent.  Patient is now accessing community with RW & prosthesis instead of w/c.   Pt amb 150' with cane stand alone tip & AKA prosthesis with min/CGA.  Gait velocity with cane: self-selected pace 1.44 ft/sec & fast pace 2.02 ft/sec Pt neg ramp & curb with cane stand alone tip & AKA prosthesis with minA & cues.   05/31/2024 Gait Speed:  Self-selected: 1.59 ft/sec Fast-as-comfortable: 2.29 ft/sec    04/10/2024: Patient ambulated 125' with RW and supervision.   Patient negotiated 12*ramp and 6.5 curb with RW and supervision  Eval: Gait pattern: step to pattern, decreased step length- Right, decreased stance time- Left, decreased hip/knee flexion- Left, circumduction- Left, Left hip hike, antalgic, trendelenburg, lateral lean- Right, trunk flexed, abducted- Left, and poor foot clearance- Left Distance walked: 62 ft Assistive device utilized: Walker - 2 wheeled and AKA prosthesis Level of assistance: SBA / steady with RW support but needs cues  Gait velocity: 0.72 ft/sec Comments: excessive weightbearing on BUEs using RW   CURRENT PROSTHETIC WEAR ASSESSMENT: 06/26/2024:  Pt is wearing prosthesis all awake hours with no skin issues but limb pain 2/10-6/10 normally.   He is independent with donning, doffing, skin  check, residual limb care, care of non-amputated limb, prosthetic cleaning, ply sock cleaning, correct ply sock adjustment, proper wear schedule/adjustment, and proper weight-bearing schedule/adjustment  Evaluation:   Patient is dependent with: skin check, residual limb care, care of non-amputated limb, prosthetic cleaning, ply sock cleaning, correct ply sock adjustment, proper wear schedule/adjustment, and proper weight-bearing schedule/adjustment Donning prosthesis: SBA / verbal cues Doffing prosthesis: Complete Independence Prosthetic wear tolerance: 6-8 hours, 1x/day, 7 days/week Prosthetic weight bearing tolerance: tolerated standing for 10 minutes with partial weight on prosthesis during BERG balance test with no increased pain. Although he reports standing pain with activities at home. Edema: minimal Residual limb condition: 3x57mm superficial wound distally located, scar with mild adherence, no adductor roll, normal skin color, decreased hair growth present Prosthetic description: silicone liner with suction ring suspension, ischial containment socket with flexible inner socket, rotator unit, SAFETY (single axis friction engaging with an extension assist), flexible keel foot  TODAY'S TREATMENT:                                                                                                                    DATE:  07/12/2024 Prosthetic training with transfemoral prosthesis: pt has wound 4mm  inside lateral distal invaginated area. He is using Vivewear liner-liner as PT instructed last session.  PT reviewed recommendation to clean both invaginated areas with soap & water . PT reviewed donning with focus on proper rotation including pelvic orientation when seating limb into socket.   Pt verbalized understanding.  PT demo & verbal cues on his tendency to brush prosthesis against right foot during swing phase and concerns with changing timing could lead to prosthetic knee not be ext or stable to  accept weight.  Focused on correcting this issues with below gait activities. Stepping prosthesis forward holding RW with green theraband around  BLEs above knee 10 reps.  PT added visual cue to his RW for proper step width. Pt amb 100' X 2 working on fluency of motion & proper step width without prosthetic foot  during swing drifting into RLE. Pt verbalized understanding.  //bars with mirror and line on floor for visual reference - worked on gait with proper step width without prosthetic foot  during swing drifting into RLE. Progressed from BUE support on bars to cane only (within bars).  Pt amb 80' X 2 with cane with minA/CGA with PT cues on controlling proper step width without prosthetic foot  during swing drifting into RLE.  Pt neg ramp & curb with cane  with minA with PT cues on technique.  Pt amb 100' with cane  with minA/CGA working on scanning right/left maintaining path & pace.     TREATMENT:                                                                                                                    DATE:  07/10/2024 Prosthetic training with transfemoral prosthesis: pt has wound (open blister ~1cm) inside lateral distal invaginated area. He has 2 distal deep invaginated area.  Blister is probably from sweat be trapped in area and unable to get air between invagination & nonbreathing liner.  PT cut Vivewear strips to roll to place in invagination and covered with 3XL Vivewear liner-liner.  PT educated in care. Pt and wife verbalized understanding.  Pt reports no pain with weight bearing after this.   PT demo & verbal cues on sit to/from stand with cane.  Pt able to perform 5 reps with supervision.   Pt amb 30' & 120' with cane stand alone tip with minA. Patient's prosthetic foot scuffs RLE while advancing prosthesis.  It appears to from not firing his left hip abductors and with right lateral lean to cane causes the prosthesis to drift inward.  Concern is this could throw off the timing so  that prosthetic foot not in position to facilitate knee ext / control with weight acceptance.   PT demo & verbal cues on seated isometric hip add & single LE hip abd (focus on keeping towel roll midline while firing ea side hip abductors.  Set-up is towel roll or small ball bw LEs and belt snug around both legs.  Pt performed 10 reps and verbalized understanding.      TREATMENT:  DATE: 06/26/2024 Prosthetic training with transfemoral prosthesis: See gait in objective data Pt amb 120' with cane stand alone tip scanning right/left and up/down with minA. PT demo & verbal cues on stepping over obstacle.  Pt stepped over 3 foam roll with cane stand alone tip with mod/HHA initial 2 reps, then modA / no HHA 2 reps. PT demo & verbal cues how to pick up object from floor using prosthesis for stability.  Pt able to pick up item 3 reps with minA & cues.  Pt able to pick up 3 reps with RW with supervision. PT discussed high risk of injury with performing activities on ladder.  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  Pt verbalized understanding.       TREATMENT:                                                                                                                    DATE: 06/21/2024 Prosthetic training with transfemoral prosthesis: sit to/from stand 18 chair without armrests using RUE on seat with cane for stabilization with supervision.   Patient ambulated 55' x 2 with cane stand-alone tip with min/CGA. 2nd rep carrying open bottle of water  with no spills but one misstep requiring minA to balance.   PT cues on upright posture, step length and weight shift over prosthesis in stance. Pt neg stairs with single rail & cane (4 steps left rail / RUE cane & 4 steps right rail / LUE cane) with verbal cues and CGA.    Neuromuscular  reeducation working on standing balance Standing balance with upper extremity resistance using green Thera-Band to facilitate core stabilization and weightbearing through both lower extremities.  PT min/CGA with tactile cues and verbal cues for upright posture and balance reactions.  Single upper extremity alternating sides 5 reps each and bilateral upper extremities 10 reps: Row, forward reach and biceps curl/upward reach. PT demo & verbal cues & HO for HEP including safe set-up & use of bands. Pt verbalized understanding after performing during today's session.     TREATMENT:                                                                                                                    DATE: 06/19/2024 Prosthetic training with transfemoral prosthesis: PT reviewed recommendations for prosthetist at appointment tomorrow to perform a dynamic alignment and modify the socket to address pain issues.  Patient verbalized understanding. Patient ambulated 50' x  2 with cane stand-alone tip with min/CGA.  PT cues on upright posture, step length and weight shift over prosthesis in stance. PT demo and verbal cues on negotiating curb with a cane and prosthesis.  Patient negotiated 6.5 curb 2 reps with min to modA.    Neuromuscular reeducation working on standing balance Standing balance with upper extremity resistance using green Thera-Band to facilitate core stabilization and weightbearing through both lower extremities.  PT min/CGA with tactile cues and verbal cues for upright posture and balance reactions.  Single upper extremity alternating sides 5 reps each and bilateral upper extremities 10 reps: Row, forward reach and biceps curl/upward reach.    HOME EXERCISE PROGRAM: Access Code: [email or web address] URL: [email or web address] Date: 06/21/2024 Prepared by: Grayce Spatz  Exercises - Modified Thomas Stretch  - 2 x daily - 7 x weekly - 1 sets - 2-3 reps - 30 seconds hold - Hip Flexor Stretch  at Edge of Bed  - 2 x daily - 7 x weekly - 1 sets - 2-3 reps - 30 seconds hold - Standing posture with back to counter  - 2 x daily - 7 x weekly - 1 sets - 1 reps - 5-10 minutes hold - Upright Stance at Door Frame Single Arm  - 3-6 x daily - 7 x weekly - 1 sets - 2 reps - 2 deep breathes hold - Upright Stance at Door Frame with Both Arms  - 3-6 x daily - 7 x weekly - 1 sets - 2 reps - 2 deep breathes hold - Alternating Punch with Resistance  - 1 x daily - 4-5 x weekly - 1 sets - 10 reps - 5 seconds hold - Standing Scapular Protraction with Resistance  - 1 x daily - 4-5 x weekly - 1 sets - 10 reps - 5 seconds hold - Standing alternate rows with resistance  - 1 x daily - 4-5 x weekly - 1 sets - 10 reps - 5 seconds hold - Standing Row with Anchored Resistance  - 1 x daily - 4-5 x weekly - 1 sets - 10 reps - 5 seconds hold - Alternating elbow flexion with resistance  - 1 x daily - 4-5 x weekly - 1 sets - 10 reps - 5 seconds hold - Standing Bicep Curls with Resistance  - 1 x daily - 4-5 x weekly - 1 sets - 10 reps - 5 seconds hold  Do each exercise 1-2  times per day Do each exercise 5-10 repetitions Hold each exercise for 2 seconds to feel your location  AT SINK FIND YOUR MIDLINE POSITION AND PLACE FEET EQUAL DISTANCE FROM THE MIDLINE.  Try to find this position when standing still for activities.   USE TAPE ON FLOOR TO MARK THE MIDLINE POSITION which is even with middle of sink.  You also should try to feel with your limb pressure in socket.  You are trying to feel with limb what you used to feel with the bottom of your foot.  Side to Side Shift: Moving your hips only (not shoulders): move weight onto your left leg, HOLD/FEEL pressure in socket.  Move back to equal weight on each leg, HOLD/FEEL pressure in socket. Move weight onto your right leg, HOLD/FEEL pressure in socket. Move back to equal weight on each leg, HOLD/FEEL pressure in socket. Repeat.  Start with both hands on sink, progress to hand  on prosthetic side only, then no hands.  Front to Back Shift: Moving your hips only (not shoulders): move  your weight forward onto your toes, HOLD/FEEL pressure in socket. Move your weight back to equal Flat Foot on both legs, HOLD/FEEL  pressure in socket. Move your weight back onto your heels, HOLD/FEEL  pressure in socket. Move your weight back to equal on both legs, HOLD/FEEL  pressure in socket. Repeat.  Start with both hands on sink, progress to hand on prosthetic side only, then no hands.  Moving Cones / Cups: With equal weight on each leg: Hold on with one hand the first time, then progress to no hand supports. Move cups from one side of sink to the other. Place cups ~2" out of your reach, progress to 10" beyond reach.  Place one hand in middle of sink and reach with other hand. Do both arms.  Then hover one hand and move cups with other hand.  Overhead/Upward Reaching: alternated reaching up to top cabinets or ceiling if no cabinets present. Keep equal weight on each leg. Start with one hand support on counter while other hand reaches and progress to no hand support with reaching.  ace one hand in middle of sink and reach with other hand. Do both arms.  Then hover one hand and move cups with other hand.  5.   Looking Over Shoulders: With equal weight on each leg: alternate turning to look over your shoulders with one hand support on counter as needed.  Start with head motions only to look in front of shoulder, then even with shoulder and progress to looking behind you. To look to side, move head /eyes, then shoulder on side looking pulls back, shift more weight to side looking and pull hip back. Place one hand in middle of sink and let go with other hand so your shoulder can pull back. Switch hands to look other way.   Then hover one hand and look over shoulder. If looking right, use left hand at sink. If looking left, use right hand at sink. 6.  Stepping with leg that is not amputated:  Move items  under cabinet out of your way. Shift your hips/pelvis so weight on prosthesis. Tighten muscles in hip on prosthetic side.  SLOWLY step other leg so front of foot is in cabinet. Then step back to floor.    ASSESSMENT:  CLINICAL IMPRESSION:    Patient appears to understand issues with his prosthetic foot during swing drifting into RLE. He was able to correct during gait when focusing.    OBJECTIVE IMPAIRMENTS: Abnormal gait, decreased activity tolerance, decreased balance, decreased endurance, decreased knowledge of condition, decreased knowledge of use of DME, decreased mobility, difficulty walking, decreased ROM, decreased strength, impaired flexibility, postural dysfunction, prosthetic dependency , and pain.   ACTIVITY LIMITATIONS: carrying, lifting, bending, sitting, standing, squatting, stairs, transfers, bathing, toileting, dressing, reach over head, and locomotion level  PARTICIPATION LIMITATIONS: meal prep, cleaning, laundry, driving, shopping, community activity, and yard work  PERSONAL FACTORS: Age, Fitness, Past/current experiences, Time since onset of injury/illness/exacerbation, and 3+ comorbidities: see PMH are also affecting patient's functional outcome.   REHAB POTENTIAL: Good  CLINICAL DECISION MAKING: Evolving/moderate complexity  EVALUATION COMPLEXITY: Moderate   GOALS: Goals reviewed with patient? Yes  SHORT TERM GOALS: Target date: 04/05/2024  Patient donnes prosthesis modified independent & verbalizes proper cleaning. Baseline: SEE OBJECTIVE DATA Goal status: MET, 04/10/2024  2.  Patient tolerates prosthesis >12 hrs total /day without skin issues or limb pain >7/10 after standing. Baseline: patient reports wearing 4-5 hr/day Goal status: GOAL MET, 70/23/2025  3.  Patient  able to reach 7 and look over both shoulders with RW support with Modified independent. Baseline: SEE OBJECTIVE DATA Goal status: MET, 04/10/2024  4. Patient ambulates 125' with RW & prosthesis  with supervision. Baseline: SEE OBJECTIVE DATA Goal status: MET, 04/10/2024  5. Patient negotiates ramps & curbs with RW & prosthesis with supervision. Baseline: SEE OBJECTIVE DATA Goal status: MET, 04/10/2024  LONG TERM GOALS: Target date: 08/23/2024  Patient demonstrates & verbalized understanding of prosthetic care to enable safe utilization of prosthesis. Baseline: SEE OBJECTIVE DATA Goal status: MET 05/29/2024  Patient tolerates prosthesis wear >90% of awake hours without skin issues or limb pain >4/10. Baseline: SEE OBJECTIVE DATA wearing most of awake hours without skin issues but having limb pain.  Goal status: Ongoing     07/10/2024  BERG >/= 25/56 to indicate lower fall risk Baseline: SEE OBJECTIVE DATA Goal status: GOAL MET, 05/31/2024  Patient ambulates >300' with prosthesis and cane modified independently Baseline: SEE OBJECTIVE DATA Goal status: Ongoing    07/10/2024  Patient negotiates ramps, curbs & stairs with single rail with prosthesis and cane independently. Baseline: SEE OBJECTIVE DATA Goal status: Ongoing   07/10/2024  6.  Patient reports Patient-Specific Activity Score improved average >7 to indicate improvement in functional activities.   Baseline: SEE OBJECTIVE DATA Goal status: Ongoing   07/10/2024  PLAN:  PT FREQUENCY: 1-2x/week  PT DURATION: 12 weeks    PLANNED INTERVENTIONS: 97164- PT Re-evaluation, 97750- Physical Performance Testing, 97110-Therapeutic exercises, 97530- Therapeutic activity, 97112- Neuromuscular re-education, 97535- Self Care, [id number]- Manual therapy, [phone]- Gait training, [phone]- Prosthetic Initial , [phone]- Orthotic/Prosthetic subsequent, Patient/Family education, Balance training, Stair training, Joint mobilization, and DME instructions  PLAN FOR NEXT SESSION: continue to work on prosthesis advancement engaging hip abductors,  gait with cane performing functional activities including scanning & carrying items.  Ramps, curbs & stairs.    Grayce Spatz, PT, DPT 07/12/2024, 4:11 PM    Date of referral: 02/13/2024  Referring provider: Harden Jerona GAILS, MD Referring diagnosis? S10.387 (ICD-10-CM) - Hx of AKA (above knee amputation), left (HCC)  Treatment diagnosis? (if different than referring diagnosis) R26.89, M62.81, M79.605, M25.652, R29.3, R26.81, L98.498  What was this (referring dx) caused by? Other: Lt AKA  vascular condition  Nature of Condition: Chronic (continuous duration > 3 months)  Prosthesis delivery 02/20/2024   Laterality: Lt  Current Functional Measure Score: Patient Specific Functional Scale Score:  5.66    Berg on 06/26/24 38/56  (on 03/05/2024 was 11/56)   score <45/56 indicates high fall risk but 27 point improvement is significant.   Objective measurements identify impairments when they are compared to normal values, the uninvolved extremity, and prior level of function.  [x]  Yes  []  No  Objective assessment of functional ability: Moderate functional limitations   Briefly describe symptoms: Patient is wearing prosthesis most of awake hours without skin issues but is having limb pain.  He is accessing community with walker & prosthesis but is limited.  He is dependent for gait with cane but has potential to use with skilled care.  Patient has improved balance - Berg on 06/26/24 38/56  (on 03/05/2024 was 11/56)   score <45/56 indicates high fall risk but 27 point improvement is significant.   How did symptoms start: amputation due to vascular issues  Average pain intensity:  Last 24 hours: 3-/10  Past week: 3-6/10  How often does the pt experience symptoms? Frequently  How much have the symptoms interfered with usual daily activities? Quite a bit  How has condition changed since care began at this facility? Better  In general, how is the patients overall health? Good   BACK PAIN (STarT Back Screening Tool) No

## 2024-07-13 ENCOUNTER — Ambulatory Visit: Admitting: Internal Medicine

## 2024-07-17 ENCOUNTER — Ambulatory Visit (INDEPENDENT_AMBULATORY_CARE_PROVIDER_SITE_OTHER): Admitting: Physical Therapy

## 2024-07-17 ENCOUNTER — Encounter: Payer: Self-pay | Admitting: Internal Medicine

## 2024-07-17 ENCOUNTER — Encounter: Payer: Self-pay | Admitting: Physical Therapy

## 2024-07-17 DIAGNOSIS — M25652 Stiffness of left hip, not elsewhere classified: Secondary | ICD-10-CM

## 2024-07-17 DIAGNOSIS — L98498 Non-pressure chronic ulcer of skin of other sites with other specified severity: Secondary | ICD-10-CM

## 2024-07-17 DIAGNOSIS — M79605 Pain in left leg: Secondary | ICD-10-CM

## 2024-07-17 DIAGNOSIS — R293 Abnormal posture: Secondary | ICD-10-CM

## 2024-07-17 DIAGNOSIS — R2681 Unsteadiness on feet: Secondary | ICD-10-CM

## 2024-07-17 DIAGNOSIS — R2689 Other abnormalities of gait and mobility: Secondary | ICD-10-CM

## 2024-07-17 DIAGNOSIS — M6281 Muscle weakness (generalized): Secondary | ICD-10-CM

## 2024-07-17 NOTE — Therapy (Signed)
 OUTPATIENT PHYSICAL THERAPY PROSTHETIC TREATMENT   Patient Name: Mike Floyd MRN: [id number] DOB:1961/05/01, 63 y.o., male Today's Date: 07/17/2024  END OF SESSION:  PT End of Session - 07/17/24 1259     Visit Number 24    Number of Visits 40    Date for Recertification  08/23/24    Authorization Type [facility] MEDICARE    Authorization Time Period [facility] MEDICARE $45 COPAY 06/28/2024 - 09/06/2024 10 VISITS    Authorization - Visit Number 3    Authorization - Number of Visits 10    Progress Note Due on Visit 30    PT Start Time 1259    PT Stop Time 1342    PT Time Calculation (min) 43 min    Equipment Utilized During Treatment Gait belt    Activity Tolerance Patient tolerated treatment well;Patient limited by fatigue    Behavior During Therapy WFL for tasks assessed/performed                Past Medical History:  Diagnosis Date   AICD (automatic cardioverter/defibrillator) present 11/2007   Removed in 2018, a. 11/2007 S[facility] Current VR - single lead ICD  - Removed 2018 - it was burning me   Anxiety    CAD (coronary artery disease)    non-obstructive CAD by Cor CT in 2020 // Myoview  3/22: EF 57, small inf-sept defect c/w scar, no ischemia; low risk      Chest pain    a. 10/2007 Cath:  normal Cors.   CKD (chronic kidney disease), stage II    DDD (degenerative disc disease), lumbar    Diabetes mellitus DX: 2010   type 2   Erosive esophagitis    a. per EGD (08/2011), Dr. Golda - Erosive reflux esophagitis improved but not completely healed since previous EGD 3 years ago. Bx showing  ulcerated gatroesophageal junction mucosa. negative for H. pylori   GERD (gastroesophageal reflux disease)    Gout    Hearing deficit    a. wear bilateral hearing aides   History of hiatal hernia    History of kidney stones    passed stones, no surgery   Hypertension    Mildly dilatd aortic root (HCC)    CMR 4/22: EF 52, no LGE; d/w Dr. Ione root 38 mm (mildly dilated)   Myocardial  infarction [facility]) 2011   Neuropathy    Feet and legs   Nonischemic dilated cardiomyopathy (HCC)    a. H/O EF as low as 35-40% by LV gram 10/2007;  b. Echo 02/2011 EF 50-55%, inf HK, Gr 1 DD // CMR 4/22: EF 52, no LGE; d/w Dr. Ione root 38 mm (mildly dilated)    Renal insufficiency    Sleep apnea    pt doesnt use, statesI cant afford one. PCP aware   Stroke (HCC)    mini-stroke in 2014   TIA (transient ischemic attack)    July, 2013   Tobacco abuse, in remission 06/27/2009   Discontinued in 2009     Wears dentures    top plate   WPW (Wolff-Parkinson-White syndrome)    a. s/p RFCA @ [facility] - 1999   Past Surgical History:  Procedure Laterality Date   AMPUTATION Left 05/19/2022   Procedure: TRANSMETATARSAL AMPUTATION LEFT FOOT;  Surgeon: Harden Jerona GAILS, MD;  Location: [facility] OR;  Service: Orthopedics;  Laterality: Left;   AMPUTATION Left 06/17/2023   Procedure: LEFT BELOW KNEE AMPUTATION;  Surgeon: Harden Jerona GAILS, MD;  Location: [facility] OR;  Service: Orthopedics;  Laterality: Left;   AMPUTATION Left 08/19/2023   Procedure: LEFT ABOVE KNEE AMPUTATION;  Surgeon: Harden Jerona GAILS, MD;  Location: [facility] OR;  Service: Orthopedics;  Laterality: Left;   APPENDECTOMY     BACK SURGERY     1995   CARDIAC CATHETERIZATION     2009   CARDIAC DEFIBRILLATOR PLACEMENT     ICD was removed in 2018   CHOLECYSTECTOMY     CHOLECYSTECTOMY, LAPAROSCOPIC     11/2007   COLONOSCOPY W/ POLYPECTOMY  2009   ELBOW SURGERY Left 06/2010   ESOPHAGEAL DILATION N/A 11/08/2014   Procedure: ESOPHAGEAL DILATION;  Surgeon: Claudis RAYMOND Rivet, MD;  Location: [facility] ORS;  Service: Endoscopy;  Laterality: N/A;  #56,    ESOPHAGOGASTRODUODENOSCOPY  03/31/2012   also 08/2011; [name]   ESOPHAGOGASTRODUODENOSCOPY (EGD) WITH PROPOFOL  N/A 11/08/2014   Procedure: ESOPHAGOGASTRODUODENOSCOPY (EGD) WITH PROPOFOL ;  Surgeon: Claudis RAYMOND Rivet, MD;  Location: [facility] ORS;  Service: Endoscopy;  Laterality: N/A;  Hiatus is 26 , GE [place]unction is 37   FOOT  ARTHRODESIS Left 10/24/2020   Procedure: LEFT GASTROCNEMIUS RECESSION, DORSIFLEXION OSTEOTOMY 1ST MT;  Surgeon: Harden Jerona GAILS, MD;  Location: [facility] OR;  Service: Orthopedics;  Laterality: Left;   FOOT ARTHRODESIS Right 01/09/2021   Procedure: CLOSING WEDGE OSTEOTOMY RIGHT 1ST METATARSAL;  Surgeon: Harden Jerona GAILS, MD;  Location: [facility] OR;  Service: Orthopedics;  Laterality: Right;   GASTROCNEMIUS RECESSION Right 01/09/2021   Procedure: RIGHT GASTROCNEMIUS RECESSION;  Surgeon: Harden Jerona GAILS, MD;  Location: [facility] OR;  Service: Orthopedics;  Laterality: Right;   ICD LEAD REMOVAL N/A 10/07/2016   Procedure: ICD LEAD REMOVAL ;  Surgeon: Danelle LELON Birmingham, MD;  Location: [facility] OR;  Service: Cardiovascular;  Laterality: N/A;   MULTIPLE TOOTH EXTRACTIONS     RADIOFREQUENCY ABLATION  1999   WPW; performed at [facility]   STUMP REVISION Left 07/22/2023   Procedure: REVISION LEFT BELOW KNEE AMPUTATION;  Surgeon: Harden Jerona GAILS, MD;  Location: [facility] OR;  Service: Orthopedics;  Laterality: Left;   TEE WITHOUT CARDIOVERSION N/A 10/07/2016   Procedure: TRANSESOPHAGEAL ECHOCARDIOGRAM (TEE);  Surgeon: Danelle LELON Birmingham, MD;  Location: [facility] OR;  Service: Cardiovascular;  Laterality: N/A;   Patient Active Problem List   Diagnosis Date Noted   Dyslipidemia 11/03/2023   Essential hypertension 11/03/2023   Type 2 diabetes mellitus with peripheral neuropathy (HCC) 11/03/2023   BPH (benign prostatic hyperplasia) 11/03/2023   GERD without esophagitis 11/03/2023   S/P AKA (above knee amputation) unilateral, left (HCC) 08/19/2023   Dehiscence of amputation stump of left lower extremity (HCC) 07/22/2023   Ischemia of site of left below knee amputation (HCC) 07/22/2023   S/P BKA (below knee amputation) unilateral, left (HCC) 07/22/2023   Below-knee amputation of left lower extremity (HCC) 06/17/2023   Acute osteomyelitis of metatarsal bone of left foot (HCC)    Subacute osteomyelitis, left ankle and foot (HCC) 05/13/2022   Non-pressure  chronic ulcer of other part of right foot limited to breakdown of skin (HCC)    Contracture of right Achilles tendon    Mildly dilatd aortic root (HCC)    Contracture of left Achilles tendon    Metatarsal deformity, left    CAD (coronary artery disease)    Hallux hammertoe, left 12/11/2019   Diarrhea 06/27/2019   Diastasis recti 08/22/2018   Umbilical hernia without obstruction and without gangrene 08/22/2018   Vitamin D  deficiency 06/23/2017   Essential hypertension, benign 06/15/2017   Mixed hyperlipidemia 06/15/2017   Class 1 obesity  due to excess calories with serious comorbidity and body mass index (BMI) of 31.0 to 31.9 in adult 06/15/2017   Malfunction of implantable cardioverter-defibrillator (ICD) electrode 10/07/2016   Chronic kidney disease (CKD), stage III (moderate) (HCC) 04/04/2015   Bladder neck obstruction 03/21/2015   Difficult or painful urination 03/21/2015   Flank pain 03/21/2015   Delayed onset of urination 03/21/2015   Calculus of kidney 03/21/2015   Erosive esophagitis 12/27/2013   Hypotension due to drugs 07/02/2013   Chronic systolic heart failure (HCC) 04/24/2013   CAP (community acquired pneumonia) 03/16/2013   HTN (hypertension) 03/15/2013   Hydrocele 03/15/2013   Spermatocele 03/15/2013   DOE (dyspnea on exertion) 03/15/2013   OSA (obstructive sleep apnea) 01/09/2013   Chronic kidney disease, stage 3 (HCC) 01/03/2013   Chest pain 12/26/2012   History of diagnostic tests 09/06/2012   TIA (transient ischemic attack)    Cardiomyopathy, nonischemic (HCC) 02/24/2010   AICD (automatic cardioverter/defibrillator) 02/24/2010   DM type 2 causing vascular disease (HCC) 06/27/2009   Gout 06/27/2009   Tobacco abuse, in remission 06/27/2009   COLONIC POLYPS 10/31/2008   GERD (gastroesophageal reflux disease) 10/31/2008    PCP: Gladis Lauraine BRAVO, NP  REFERRING PROVIDER: Harden Jerona GAILS, MD  REFERRING DIAG: [phone] (ICD-10-CM) - Hx of AKA (above knee  amputation), left (HCC)   THERAPY DIAG:  Other abnormalities of gait and mobility  Muscle weakness (generalized)  Stiffness of left hip, not elsewhere classified  Pain in left leg  Abnormal posture  Unsteadiness on feet  Non-pressure chronic ulcer of skin of other sites with other specified severity [facility])  Rationale for Evaluation and Treatment: Rehabilitation  ONSET DATE: Prosthesis delivery 02/20/2024  SUBJECTIVE:   SUBJECTIVE STATEMENT:   He continues to use Vivewear under his liner and it feels a lot better.  He has been working to make sure prosthesis does not brush other leg. Pt reports that he was able to use PT training on floor transfers to do some car repairs.     PERTINENT HISTORY: DM2, GERD, hypertension, dyslipidemia, CAD, L BKA (06/17/23), neuropathy  Eval: Patient is a 63 year old male who presents to physical therapy with a left AKA (08/19/2023) with a new prosthesis delivery on 02/20/2024. Patient had previous home health PT prior to Lt AKA. He is able to mobilize with modified independence w/ walker. Wears prosthesis about ten hours a day and by the end of the night he takes it off due to fatigue and pain.   PAIN:  Are you having pain? Yes: NPRS scale: 2.5-3/10 today, 10/10 at worst since last PT Pain location: Femur in prosthesis and pain in the Rt leg as well Pain description: feels like its been hammered Aggravating factors: keeping prosthesis on for long periods of times, sitting Relieving factors: taking off prosthesis and medications  PRECAUTIONS: Fall  RED FLAGS: None   WEIGHT BEARING RESTRICTIONS: No  FALLS:  Has patient fallen in last 6 months? Yes. Number of falls 2, forgetting he doesn't have a leg and put weight onto the left side. He did not hit the floor, only fell into another person. Occurred when transferring from sitting to standing  LIVING ENVIRONMENT: Lives with: lives with their spouse Lives in: Other Trailer on grass Stairs:  Yes: External: 4 steps; can reach both Has following equipment at home: Single point cane, Environmental Consultant - 2 wheeled, Wheelchair (manual), and shower chair  OCCUPATION: retired   PLOF: Independent with household mobility with device  PATIENT GOALS: ambulating and ADLs  with cane   NEXT MD VISIT: Office MD visit: 03/21/2024   OBJECTIVE:  Patient-Specific Activity Scoring Scheme  0 represents "unable to perform." 10 represents "able to perform at prior level. 0 1 2 3 4 5 6 7 8 9  10 (Date and Score)   Activity Eval  06/19/24   1. Walking w/ cane  2 3   2. Standing for ADLs  5  7  3. Stairs 5 7  4.    5.    Score 4 5.66   Total score = sum of the activity scores/number of activities Minimum detectable change (90%CI) for average score = 2 points Minimum detectable change (90%CI) for single activity score = 3 points  DIAGNOSTIC FINDINGS: none post-amputation  COGNITION: Overall cognitive status: Within functional limits for tasks assessed   SENSATION: WFL  MUSCLE LENGTH:   POSTURE: rounded shoulders, forward head, flexed trunk , and weight shift right  LOWER EXTREMITY ROM:  ROM P:passive  A:active Left eval 06/14/24 06/28/24  Hip flexion     Hip extension Thomas position: P: - 40* P: -27 Supine Thomas Left/right -22*/-17*  Hip abduction     Hip adduction     Hip internal rotation     Hip external rotation     Knee flexion     Knee extension     Ankle dorsiflexion     Ankle plantarflexion     Ankle inversion     Ankle eversion      (Blank rows = not tested)  LOWER EXTREMITY MMT:  MMT Right eval Left eval Right 06/28/24 Left 06/28/24  Hip flexion 3/5 3/5 4/5 4-/5  Hip extension 3/5 3-/5 4/5 4-/5  Hip abduction  3-/5  4-/5  Hip adduction      Hip internal rotation      Hip external rotation      Knee flexion      Knee extension 3/5  4/5   Ankle dorsiflexion      Ankle plantarflexion      Ankle inversion      Ankle eversion      At Evaluation all  strength testing is grossly seated and functionally standing / gait. (Blank rows = not tested)  BED MOBILITY:  Evaluation on 03/05/2024 Sit to supine Complete Independence Supine to sit Complete Independence Rolling to Right Complete Independence  TRANSFERS: 06/28/2024:  sit to/from stand 18 chair with armrests to cane stand alone tip safely.  04/10/2024: Pt able stand to/from sit into chair without armrest using BUEs on seat and RW for stabilization with supervision/verbal cues.  Eval: Sit to stand: supervision requires a stable object to arise, stabilization w/ walker using UE support, with delayed weight shift onto the prosthesis for stabilization, and uses UE to bring Lt prosthesis back below his body  Stand to sit: supervision requires a stable object to arise, stabilization w/ walker using UE support, with delayed weight shift onto the prosthesis for stabilization  FUNCTIONAL TESTs:  06/26/2024:  Timed Up & Go with cane with CGA 33.85sec. Berg Balance [phone]  [facility] PT Assessment - 06/26/24 1345       Berg Balance Test   Sit to Stand Able to stand  independently using hands    Standing Unsupported Able to stand safely 2 minutes    Sitting with Back Unsupported but Feet Supported on Floor or Stool Able to sit safely and securely 2 minutes    Stand to Sit Controls descent by using hands    Transfers  Able to transfer safely, minor use of hands    Standing Unsupported with Eyes Closed Able to stand 10 seconds with supervision    Standing Unsupported with Feet Together Able to place feet together independently and stand for 1 minute with supervision    From Standing, Reach Forward with Outstretched Arm Can reach confidently >25 cm (10)    From Standing Position, Pick up Object from Floor Able to pick up shoe, needs supervision    From Standing Position, Turn to Look Behind Over each Shoulder Turn sideways only but maintains balance    Turn 360 Degrees Needs assistance while turning    needs minA without device for safety.   Standing Unsupported, Alternately Place Feet on Step/Stool Able to complete >2 steps/needs minimal assist    Standing Unsupported, One Foot in Front Able to plae foot ahead of the other independently and hold 30 seconds    Standing on One Leg Tries to lift leg/unable to hold 3 seconds but remains standing independently    Total Score 38    Berg comment: BERG  < 36 high risk for falls (close to 100%) 46-51 moderate (>50%)   37-45 significant (>80%) 52-55 lower (> 25%)         BERG: 06/14/2024 31/56 performed with RW in front of patient and intermittently using RW during breaks   04/10/2024: Patient able to reach 10 forward and look over his shoulders with single upper extremity support on RW.   Eval:  BERG score 11/56 05/31/2024: performed with standing rest breaks at RW, but performed below tasks without RW   [facility] PT Assessment - 05/31/24 0001       Berg Balance Test   Sit to Stand Able to stand using hands after several tries    Standing Unsupported Able to stand 2 minutes with supervision    Sitting with Back Unsupported but Feet Supported on Floor or Stool Able to sit safely and securely 2 minutes    Stand to Sit Uses backs of legs against chair to control descent    Transfers Able to transfer safely, definite need of hands    Standing Unsupported with Eyes Closed Able to stand 10 seconds with supervision    Standing Unsupported with Feet Together Able to place feet together independently and stand for 1 minute with supervision    From Standing, Reach Forward with Outstretched Arm Reaches forward but needs supervision    From Standing Position, Pick up Object from Floor Able to pick up shoe, needs supervision    From Standing Position, Turn to Look Behind Over each Shoulder Needs supervision when turning    Turn 360 Degrees Needs assistance while turning    Standing Unsupported, Alternately Place Feet on Step/Stool Needs assistance to keep from  falling or unable to try    Standing Unsupported, One Foot in Front Able to plae foot ahead of the other independently and hold 30 seconds    Standing on One Leg Tries to lift leg/unable to hold 3 seconds but remains standing independently    Total Score 29    Berg comment: BERG < 36 high risk for falls (close to 100%) 46-51 moderate (>50%) 37-45 significant (>80%) 52-55 lower (> 25%)         CARDIOVASCULAR RESPONSE: Functional activity: gait with RW & cane noted below Pre-activity vitals: HR: 94 SpO2: 98% Post-activity vitals: HR: 107 (took only 1 min to recover to baseline) SpO2: 96%   GAIT: 06/28/2024: Patient amb >300' with  RW & prosthesis modified independent. Gait velocity with RW: self-selected pace 1.64 ft/sec & fast pace 2.29 ft/sec Patient neg ramp & curb with RW & prosthesis modified independent.  Patient is now accessing community with RW & prosthesis instead of w/c.   Pt amb 150' with cane stand alone tip & AKA prosthesis with min/CGA.  Gait velocity with cane: self-selected pace 1.44 ft/sec & fast pace 2.02 ft/sec Pt neg ramp & curb with cane stand alone tip & AKA prosthesis with minA & cues.   05/31/2024 Gait Speed:  Self-selected: 1.59 ft/sec Fast-as-comfortable: 2.29 ft/sec    04/10/2024: Patient ambulated 125' with RW and supervision.   Patient negotiated 12*ramp and 6.5 curb with RW and supervision  Eval: Gait pattern: step to pattern, decreased step length- Right, decreased stance time- Left, decreased hip/knee flexion- Left, circumduction- Left, Left hip hike, antalgic, trendelenburg, lateral lean- Right, trunk flexed, abducted- Left, and poor foot clearance- Left Distance walked: 62 ft Assistive device utilized: Walker - 2 wheeled and AKA prosthesis Level of assistance: SBA / steady with RW support but needs cues  Gait velocity: 0.72 ft/sec Comments: excessive weightbearing on BUEs using RW   CURRENT PROSTHETIC WEAR ASSESSMENT: 06/26/2024:  Pt  is wearing prosthesis all awake hours with no skin issues but limb pain 2/10-6/10 normally.   He is independent with donning, doffing, skin check, residual limb care, care of non-amputated limb, prosthetic cleaning, ply sock cleaning, correct ply sock adjustment, proper wear schedule/adjustment, and proper weight-bearing schedule/adjustment  Evaluation:   Patient is dependent with: skin check, residual limb care, care of non-amputated limb, prosthetic cleaning, ply sock cleaning, correct ply sock adjustment, proper wear schedule/adjustment, and proper weight-bearing schedule/adjustment Donning prosthesis: SBA / verbal cues Doffing prosthesis: Complete Independence Prosthetic wear tolerance: 6-8 hours, 1x/day, 7 days/week Prosthetic weight bearing tolerance: tolerated standing for 10 minutes with partial weight on prosthesis during BERG balance test with no increased pain. Although he reports standing pain with activities at home. Edema: minimal Residual limb condition: 3x7mm superficial wound distally located, scar with mild adherence, no adductor roll, normal skin color, decreased hair growth present Prosthetic description: silicone liner with suction ring suspension, ischial containment socket with flexible inner socket, rotator unit, SAFETY (single axis friction engaging with an extension assist), flexible keel foot  TODAY'S TREATMENT:                                                                                                                    DATE:  07/17/2024 Prosthetic training with transfemoral prosthesis: pt has wound 1.35mm  inside lateral distal invaginated area. He is using Vivewear liner-liner as PT instructed last session.   Patient able to demonstrate proper donning with rotation.  Patient has a medial whip still present.  PT contacted prosthetist with recommendation for alignment change to address medial whip.  Patient is scheduled to see Amy Monday at 8 AM. Pt amb 80' X 2 with  cane with minA/CGA with PT cues on controlling proper step width without prosthetic  foot  during swing drifting into RLE.  Patient ambulating inside parallel bars with cane with occasional intermittent touch.  Sidestepping with PT cues on weight shifting technique.  Progressed from BUE on parallel bar to cane & LUE on parallel bar to cane only with PT min/CGA.  Backwards walking with PT cues on technique including weight shift.  Progressed from BUE on parallel bar to cane & LUE on parallel bar to cane only with PT min/CGA.  PT demo and verbal cues how to move a chair forward, backwards and turn summer to how he would if he were moving a chair under a dining room table. Pt to return demo & verbalized understanding.      TREATMENT:                                                                                                                    DATE:  07/12/2024 Prosthetic training with transfemoral prosthesis: pt has wound 4mm  inside lateral distal invaginated area. He is using Vivewear liner-liner as PT instructed last session.  PT reviewed recommendation to clean both invaginated areas with soap & water . PT reviewed donning with focus on proper rotation including pelvic orientation when seating limb into socket.   Pt verbalized understanding.  PT demo & verbal cues on his tendency to brush prosthesis against right foot during swing phase and concerns with changing timing could lead to prosthetic knee not be ext or stable to accept weight.  Focused on correcting this issues with below gait activities. Stepping prosthesis forward holding RW with green theraband around BLEs above knee 10 reps.  PT added visual cue to his RW for proper step width. Pt amb 100' X 2 working on fluency of motion & proper step width without prosthetic foot  during swing drifting into RLE. Pt verbalized understanding.  //bars with mirror and line on floor for visual reference - worked on gait with proper step width without  prosthetic foot  during swing drifting into RLE. Progressed from BUE support on bars to cane only (within bars).  Pt amb 80' X 2 with cane with minA/CGA with PT cues on controlling proper step width without prosthetic foot  during swing drifting into RLE.  Pt neg ramp & curb with cane  with minA with PT cues on technique.  Pt amb 100' with cane  with minA/CGA working on scanning right/left maintaining path & pace.     TREATMENT:  DATE:  07/10/2024 Prosthetic training with transfemoral prosthesis: pt has wound (open blister ~1cm) inside lateral distal invaginated area. He has 2 distal deep invaginated area.  Blister is probably from sweat be trapped in area and unable to get air between invagination & nonbreathing liner.  PT cut Vivewear strips to roll to place in invagination and covered with 3XL Vivewear liner-liner.  PT educated in care. Pt and wife verbalized understanding.  Pt reports no pain with weight bearing after this.   PT demo & verbal cues on sit to/from stand with cane.  Pt able to perform 5 reps with supervision.   Pt amb 30' & 120' with cane stand alone tip with minA. Patient's prosthetic foot scuffs RLE while advancing prosthesis.  It appears to from not firing his left hip abductors and with right lateral lean to cane causes the prosthesis to drift inward.  Concern is this could throw off the timing so that prosthetic foot not in position to facilitate knee ext / control with weight acceptance.   PT demo & verbal cues on seated isometric hip add & single LE hip abd (focus on keeping towel roll midline while firing ea side hip abductors.  Set-up is towel roll or small ball bw LEs and belt snug around both legs.  Pt performed 10 reps and verbalized understanding.      TREATMENT:                                                                                                                     DATE: 06/26/2024 Prosthetic training with transfemoral prosthesis: See gait in objective data Pt amb 120' with cane stand alone tip scanning right/left and up/down with minA. PT demo & verbal cues on stepping over obstacle.  Pt stepped over 3 foam roll with cane stand alone tip with mod/HHA initial 2 reps, then modA / no HHA 2 reps. PT demo & verbal cues how to pick up object from floor using prosthesis for stability.  Pt able to pick up item 3 reps with minA & cues.  Pt able to pick up 3 reps with RW with supervision. PT discussed high risk of injury with performing activities on ladder.  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  Pt verbalized understanding.      HOME EXERCISE PROGRAM: Access Code: [email or web address] URL: [email or web address] Date: 06/21/2024 Prepared by: Grayce Spatz  Exercises - Modified Debby Stretch  - 2 x daily - 7 x weekly - 1 sets - 2-3 reps - 30 seconds hold - Hip Flexor Stretch at Edge of Bed  - 2 x daily - 7 x weekly - 1 sets - 2-3 reps - 30 seconds hold - Standing posture with back to counter  - 2 x daily - 7 x weekly - 1 sets - 1 reps - 5-10 minutes hold - Upright Stance at Door Frame Single Arm  - 3-6 x daily - 7 x weekly -  1 sets - 2 reps - 2 deep breathes hold - Upright Stance at Door Frame with Both Arms  - 3-6 x daily - 7 x weekly - 1 sets - 2 reps - 2 deep breathes hold - Alternating Punch with Resistance  - 1 x daily - 4-5 x weekly - 1 sets - 10 reps - 5 seconds hold - Standing Scapular Protraction with Resistance  - 1 x daily - 4-5 x weekly - 1 sets - 10 reps - 5 seconds hold - Standing alternate rows with resistance  - 1 x daily - 4-5 x weekly - 1 sets - 10 reps - 5 seconds hold - Standing Row with Anchored Resistance  - 1 x daily - 4-5 x weekly - 1 sets - 10 reps - 5 seconds hold - Alternating elbow flexion with resistance  - 1 x daily - 4-5 x weekly - 1 sets - 10 reps - 5  seconds hold - Standing Bicep Curls with Resistance  - 1 x daily - 4-5 x weekly - 1 sets - 10 reps - 5 seconds hold  Do each exercise 1-2  times per day Do each exercise 5-10 repetitions Hold each exercise for 2 seconds to feel your location  AT SINK FIND YOUR MIDLINE POSITION AND PLACE FEET EQUAL DISTANCE FROM THE MIDLINE.  Try to find this position when standing still for activities.   USE TAPE ON FLOOR TO MARK THE MIDLINE POSITION which is even with middle of sink.  You also should try to feel with your limb pressure in socket.  You are trying to feel with limb what you used to feel with the bottom of your foot.  Side to Side Shift: Moving your hips only (not shoulders): move weight onto your left leg, HOLD/FEEL pressure in socket.  Move back to equal weight on each leg, HOLD/FEEL pressure in socket. Move weight onto your right leg, HOLD/FEEL pressure in socket. Move back to equal weight on each leg, HOLD/FEEL pressure in socket. Repeat.  Start with both hands on sink, progress to hand on prosthetic side only, then no hands.  Front to Back Shift: Moving your hips only (not shoulders): move your weight forward onto your toes, HOLD/FEEL pressure in socket. Move your weight back to equal Flat Foot on both legs, HOLD/FEEL  pressure in socket. Move your weight back onto your heels, HOLD/FEEL  pressure in socket. Move your weight back to equal on both legs, HOLD/FEEL  pressure in socket. Repeat.  Start with both hands on sink, progress to hand on prosthetic side only, then no hands.  Moving Cones / Cups: With equal weight on each leg: Hold on with one hand the first time, then progress to no hand supports. Move cups from one side of sink to the other. Place cups ~2" out of your reach, progress to 10" beyond reach.  Place one hand in middle of sink and reach with other hand. Do both arms.  Then hover one hand and move cups with other hand.  Overhead/Upward Reaching: alternated reaching up to top cabinets  or ceiling if no cabinets present. Keep equal weight on each leg. Start with one hand support on counter while other hand reaches and progress to no hand support with reaching.  ace one hand in middle of sink and reach with other hand. Do both arms.  Then hover one hand and move cups with other hand.  5.   Looking Over Shoulders: With equal weight on each leg:  alternate turning to look over your shoulders with one hand support on counter as needed.  Start with head motions only to look in front of shoulder, then even with shoulder and progress to looking behind you. To look to side, move head /eyes, then shoulder on side looking pulls back, shift more weight to side looking and pull hip back. Place one hand in middle of sink and let go with other hand so your shoulder can pull back. Switch hands to look other way.   Then hover one hand and look over shoulder. If looking right, use left hand at sink. If looking left, use right hand at sink. 6.  Stepping with leg that is not amputated:  Move items under cabinet out of your way. Shift your hips/pelvis so weight on prosthesis. Tighten muscles in hip on prosthetic side.  SLOWLY step other leg so front of foot is in cabinet. Then step back to floor.    ASSESSMENT:  CLINICAL IMPRESSION:    Patient improved his ability to side step & walk backwards with PT instruction.  He also is now able to safely move a chair like to/from dining table.  Pt needs to see prosthetist for alignment changes.   OBJECTIVE IMPAIRMENTS: Abnormal gait, decreased activity tolerance, decreased balance, decreased endurance, decreased knowledge of condition, decreased knowledge of use of DME, decreased mobility, difficulty walking, decreased ROM, decreased strength, impaired flexibility, postural dysfunction, prosthetic dependency , and pain.   ACTIVITY LIMITATIONS: carrying, lifting, bending, sitting, standing, squatting, stairs, transfers, bathing, toileting, dressing, reach over head,  and locomotion level  PARTICIPATION LIMITATIONS: meal prep, cleaning, laundry, driving, shopping, community activity, and yard work  PERSONAL FACTORS: Age, Fitness, Past/current experiences, Time since onset of injury/illness/exacerbation, and 3+ comorbidities: see PMH are also affecting patient's functional outcome.   REHAB POTENTIAL: Good  CLINICAL DECISION MAKING: Evolving/moderate complexity  EVALUATION COMPLEXITY: Moderate   GOALS: Goals reviewed with patient? Yes  SHORT TERM GOALS: Target date: 04/05/2024  Patient donnes prosthesis modified independent & verbalizes proper cleaning. Baseline: SEE OBJECTIVE DATA Goal status: MET, 04/10/2024  2.  Patient tolerates prosthesis >12 hrs total /day without skin issues or limb pain >7/10 after standing. Baseline: patient reports wearing 4-5 hr/day Goal status: GOAL MET, 70/23/2025  3.  Patient able to reach 7 and look over both shoulders with RW support with Modified independent. Baseline: SEE OBJECTIVE DATA Goal status: MET, 04/10/2024  4. Patient ambulates 125' with RW & prosthesis with supervision. Baseline: SEE OBJECTIVE DATA Goal status: MET, 04/10/2024  5. Patient negotiates ramps & curbs with RW & prosthesis with supervision. Baseline: SEE OBJECTIVE DATA Goal status: MET, 04/10/2024  LONG TERM GOALS: Target date: 08/23/2024  Patient demonstrates & verbalized understanding of prosthetic care to enable safe utilization of prosthesis. Baseline: SEE OBJECTIVE DATA Goal status: MET 05/29/2024  Patient tolerates prosthesis wear >90% of awake hours without skin issues or limb pain >4/10. Baseline: SEE OBJECTIVE DATA wearing most of awake hours without skin issues but having limb pain.  Goal status: Ongoing     07/17/2024  BERG >/= 25/56 to indicate lower fall risk Baseline: SEE OBJECTIVE DATA Goal status: GOAL MET, 05/31/2024  Patient ambulates >300' with prosthesis and cane modified independently Baseline: SEE OBJECTIVE  DATA Goal status: Ongoing    07/17/2024  Patient negotiates ramps, curbs & stairs with single rail with prosthesis and cane independently. Baseline: SEE OBJECTIVE DATA Goal status: Ongoing   07/17/2024  6.  Patient reports Patient-Specific Activity Score improved average >7 to  indicate improvement in functional activities.   Baseline: SEE OBJECTIVE DATA Goal status: Ongoing   07/17/2024  PLAN:  PT FREQUENCY: 1-2x/week  PT DURATION: 12 weeks    PLANNED INTERVENTIONS: 97164- PT Re-evaluation, 97750- Physical Performance Testing, 97110-Therapeutic exercises, 97530- Therapeutic activity, [id number]- Neuromuscular re-education, 97535- Self Care, [id number]- Manual therapy, [phone]- Gait training, [phone]- Prosthetic Initial , [phone]- Orthotic/Prosthetic subsequent, Patient/Family education, Balance training, Stair training, Joint mobilization, and DME instructions  PLAN FOR NEXT SESSION: work towards LTGs.   continue to work on prosthesis advancement engaging hip abductors,  gait with cane performing functional activities including scanning & carrying items.  Ramps, curbs & stairs.   Grayce Spatz, PT, DPT 07/17/2024, 5:01 PM    Date of referral: 02/13/2024  Referring provider: Harden Jerona GAILS, MD Referring diagnosis? S10.387 (ICD-10-CM) - Hx of AKA (above knee amputation), left (HCC)  Treatment diagnosis? (if different than referring diagnosis) R26.89, M62.81, M79.605, M25.652, R29.3, R26.81, L98.498  What was this (referring dx) caused by? Other: Lt AKA  vascular condition  Nature of Condition: Chronic (continuous duration > 3 months)  Prosthesis delivery 02/20/2024   Laterality: Lt  Current Functional Measure Score: Patient Specific Functional Scale Score:  5.66    Berg on 06/26/24 38/56  (on 03/05/2024 was 11/56)   score <45/56 indicates high fall risk but 27 point improvement is significant.   Objective measurements identify impairments when they are compared to normal values, the uninvolved  extremity, and prior level of function.  [x]  Yes  []  No  Objective assessment of functional ability: Moderate functional limitations   Briefly describe symptoms: Patient is wearing prosthesis most of awake hours without skin issues but is having limb pain.  He is accessing community with walker & prosthesis but is limited.  He is dependent for gait with cane but has potential to use with skilled care.  Patient has improved balance - Berg on 06/26/24 38/56  (on 03/05/2024 was 11/56)   score <45/56 indicates high fall risk but 27 point improvement is significant.   How did symptoms start: amputation due to vascular issues  Average pain intensity:  Last 24 hours: 3-/10  Past week: 3-6/10  How often does the pt experience symptoms? Frequently  How much have the symptoms interfered with usual daily activities? Quite a bit  How has condition changed since care began at this facility? Better  In general, how is the patients overall health? Good   BACK PAIN (STarT Back Screening Tool) No

## 2024-07-19 ENCOUNTER — Encounter: Admitting: Physical Therapy

## 2024-07-19 NOTE — Therapy (Incomplete)
 OUTPATIENT PHYSICAL THERAPY PROSTHETIC TREATMENT   Patient Name: Mike Floyd MRN: [id number] DOB:Dec 06, 1960, 63 y.o., male Today's Date: 07/19/2024  END OF SESSION:          Past Medical History:  Diagnosis Date   AICD (automatic cardioverter/defibrillator) present 11/2007   Removed in 2018, a. 11/2007 [facility] Current VR - single lead ICD  - Removed 2018 - it was burning me   Anxiety    CAD (coronary artery disease)    non-obstructive CAD by Cor CT in 2020 // Myoview  3/22: EF 57, small inf-sept defect c/w scar, no ischemia; low risk      Chest pain    a. 10/2007 Cath:  normal Cors.   CKD (chronic kidney disease), stage II    DDD (degenerative disc disease), lumbar    Diabetes mellitus DX: 2010   type 2   Erosive esophagitis    a. per EGD (08/2011), Dr. Golda - Erosive reflux esophagitis improved but not completely healed since previous EGD 3 years ago. Bx showing  ulcerated gatroesophageal junction mucosa. negative for H. pylori   GERD (gastroesophageal reflux disease)    Gout    Hearing deficit    a. wear bilateral hearing aides   History of hiatal hernia    History of kidney stones    passed stones, no surgery   Hypertension    Mildly dilatd aortic root (HCC)    CMR 4/22: EF 52, no LGE; d/w Dr. Ione root 38 mm (mildly dilated)   Myocardial infarction [facility]) 2011   Neuropathy    Feet and legs   Nonischemic dilated cardiomyopathy (HCC)    a. H/O EF as low as 35-40% by LV gram 10/2007;  b. Echo 02/2011 EF 50-55%, inf HK, Gr 1 DD // CMR 4/22: EF 52, no LGE; d/w Dr. Ione root 38 mm (mildly dilated)    Renal insufficiency    Sleep apnea    pt doesnt use, statesI cant afford one. PCP aware   Stroke (HCC)    mini-stroke in 2014   TIA (transient ischemic attack)    July, 2013   Tobacco abuse, in remission 06/27/2009   Discontinued in 2009     Wears dentures    top plate   WPW (Wolff-Parkinson-White syndrome)    a. s/p RFCA @ [facility] - 1999   Past  Surgical History:  Procedure Laterality Date   AMPUTATION Left 05/19/2022   Procedure: TRANSMETATARSAL AMPUTATION LEFT FOOT;  Surgeon: Harden Jerona GAILS, MD;  Location: [facility] OR;  Service: Orthopedics;  Laterality: Left;   AMPUTATION Left 06/17/2023   Procedure: LEFT BELOW KNEE AMPUTATION;  Surgeon: Harden Jerona GAILS, MD;  Location: [facility] OR;  Service: Orthopedics;  Laterality: Left;   AMPUTATION Left 08/19/2023   Procedure: LEFT ABOVE KNEE AMPUTATION;  Surgeon: Harden Jerona GAILS, MD;  Location: [facility] OR;  Service: Orthopedics;  Laterality: Left;   APPENDECTOMY     BACK SURGERY     1995   CARDIAC CATHETERIZATION     2009   CARDIAC DEFIBRILLATOR PLACEMENT     ICD was removed in 2018   CHOLECYSTECTOMY     CHOLECYSTECTOMY, LAPAROSCOPIC     11/2007   COLONOSCOPY W/ POLYPECTOMY  2009   ELBOW SURGERY Left 06/2010   ESOPHAGEAL DILATION N/A 11/08/2014   Procedure: ESOPHAGEAL DILATION;  Surgeon: Claudis RAYMOND Golda, MD;  Location: [facility] ORS;  Service: Endoscopy;  Laterality: N/A;  #56,    ESOPHAGOGASTRODUODENOSCOPY  03/31/2012   also 08/2011; [name]   ESOPHAGOGASTRODUODENOSCOPY (  EGD) WITH PROPOFOL  N/A 11/08/2014   Procedure: ESOPHAGOGASTRODUODENOSCOPY (EGD) WITH PROPOFOL ;  Surgeon: Claudis RAYMOND Rivet, MD;  Location: [facility];  Service: Endoscopy;  Laterality: N/A;  Hiatus is 8 , GE Junction is 37   FOOT ARTHRODESIS Left 10/24/2020   Procedure: LEFT GASTROCNEMIUS RECESSION, DORSIFLEXION OSTEOTOMY 1ST MT;  Surgeon: Harden Jerona GAILS, MD;  Location: [facility] OR;  Service: Orthopedics;  Laterality: Left;   FOOT ARTHRODESIS Right 01/09/2021   Procedure: CLOSING WEDGE OSTEOTOMY RIGHT 1ST METATARSAL;  Surgeon: Harden Jerona GAILS, MD;  Location: [facility] OR;  Service: Orthopedics;  Laterality: Right;   GASTROCNEMIUS RECESSION Right 01/09/2021   Procedure: RIGHT GASTROCNEMIUS RECESSION;  Surgeon: Harden Jerona GAILS, MD;  Location: [facility] OR;  Service: Orthopedics;  Laterality: Right;   ICD LEAD REMOVAL N/A 10/07/2016   Procedure: ICD LEAD REMOVAL ;  Surgeon:  Danelle LELON Birmingham, MD;  Location: [facility] OR;  Service: Cardiovascular;  Laterality: N/A;   MULTIPLE TOOTH EXTRACTIONS     RADIOFREQUENCY ABLATION  1999   WPW; performed at [facility]   STUMP REVISION Left 07/22/2023   Procedure: REVISION LEFT BELOW KNEE AMPUTATION;  Surgeon: Harden Jerona GAILS, MD;  Location: [facility] OR;  Service: Orthopedics;  Laterality: Left;   TEE WITHOUT CARDIOVERSION N/A 10/07/2016   Procedure: TRANSESOPHAGEAL ECHOCARDIOGRAM (TEE);  Surgeon: Danelle LELON Birmingham, MD;  Location: [facility] OR;  Service: Cardiovascular;  Laterality: N/A;   Patient Active Problem List   Diagnosis Date Noted   Dyslipidemia 11/03/2023   Essential hypertension 11/03/2023   Type 2 diabetes mellitus with peripheral neuropathy (HCC) 11/03/2023   BPH (benign prostatic hyperplasia) 11/03/2023   GERD without esophagitis 11/03/2023   S/P AKA (above knee amputation) unilateral, left (HCC) 08/19/2023   Dehiscence of amputation stump of left lower extremity (HCC) 07/22/2023   Ischemia of site of left below knee amputation (HCC) 07/22/2023   S/P BKA (below knee amputation) unilateral, left (HCC) 07/22/2023   Below-knee amputation of left lower extremity (HCC) 06/17/2023   Acute osteomyelitis of metatarsal bone of left foot (HCC)    Subacute osteomyelitis, left ankle and foot (HCC) 05/13/2022   Non-pressure chronic ulcer of other part of right foot limited to breakdown of skin (HCC)    Contracture of right Achilles tendon    Mildly dilatd aortic root (HCC)    Contracture of left Achilles tendon    Metatarsal deformity, left    CAD (coronary artery disease)    Hallux hammertoe, left 12/11/2019   Diarrhea 06/27/2019   Diastasis recti 08/22/2018   Umbilical hernia without obstruction and without gangrene 08/22/2018   Vitamin D  deficiency 06/23/2017   Essential hypertension, benign 06/15/2017   Mixed hyperlipidemia 06/15/2017   Class 1 obesity due to excess calories with serious comorbidity and body mass index (BMI) of 31.0  to 31.9 in adult 06/15/2017   Malfunction of implantable cardioverter-defibrillator (ICD) electrode 10/07/2016   Chronic kidney disease (CKD), stage III (moderate) (HCC) 04/04/2015   Bladder neck obstruction 03/21/2015   Difficult or painful urination 03/21/2015   Flank pain 03/21/2015   Delayed onset of urination 03/21/2015   Calculus of kidney 03/21/2015   Erosive esophagitis 12/27/2013   Hypotension due to drugs 07/02/2013   Chronic systolic heart failure (HCC) 04/24/2013   CAP (community acquired pneumonia) 03/16/2013   HTN (hypertension) 03/15/2013   Hydrocele 03/15/2013   Spermatocele 03/15/2013   DOE (dyspnea on exertion) 03/15/2013   OSA (obstructive sleep apnea) 01/09/2013   Chronic kidney disease, stage 3 (HCC) 01/03/2013   Chest pain  12/26/2012   History of diagnostic tests 09/06/2012   TIA (transient ischemic attack)    Cardiomyopathy, nonischemic (HCC) 02/24/2010   AICD (automatic cardioverter/defibrillator) 02/24/2010   DM type 2 causing vascular disease (HCC) 06/27/2009   Gout 06/27/2009   Tobacco abuse, in remission 06/27/2009   COLONIC POLYPS 10/31/2008   GERD (gastroesophageal reflux disease) 10/31/2008    PCP: Gladis Lauraine BRAVO, NP  REFERRING PROVIDER: Harden Jerona GAILS, MD  REFERRING DIAG: [phone] (ICD-10-CM) - Hx of AKA (above knee amputation), left (HCC)   THERAPY DIAG:  No diagnosis found.  Rationale for Evaluation and Treatment: Rehabilitation  ONSET DATE: Prosthesis delivery 02/20/2024  SUBJECTIVE:   SUBJECTIVE STATEMENT:   ***  He continues to use Vivewear under his liner and it feels a lot better.  He has been working to make sure prosthesis does not brush other leg. Pt reports that he was able to use PT training on floor transfers to do some car repairs.     PERTINENT HISTORY: DM2, GERD, hypertension, dyslipidemia, CAD, L BKA (06/17/23), neuropathy  Eval: Patient is a 63 year old male who presents to physical therapy with a left AKA (08/19/2023)  with a new prosthesis delivery on 02/20/2024. Patient had previous home health PT prior to Lt AKA. He is able to mobilize with modified independence w/ walker. Wears prosthesis about ten hours a day and by the end of the night he takes it off due to fatigue and pain.   PAIN:  Are you having pain? Yes: NPRS scale: ***  2.5-3/10 today, 10/10 at worst since last PT Pain location: Femur in prosthesis and pain in the Rt leg as well Pain description: feels like its been hammered Aggravating factors: keeping prosthesis on for long periods of times, sitting Relieving factors: taking off prosthesis and medications  PRECAUTIONS: Fall  RED FLAGS: None   WEIGHT BEARING RESTRICTIONS: No  FALLS:  Has patient fallen in last 6 months? Yes. Number of falls 2, forgetting he doesn't have a leg and put weight onto the left side. He did not hit the floor, only fell into another person. Occurred when transferring from sitting to standing  LIVING ENVIRONMENT: Lives with: lives with their spouse Lives in: Other Trailer on grass Stairs: Yes: External: 4 steps; can reach both Has following equipment at home: Single point cane, Environmental Consultant - 2 wheeled, Wheelchair (manual), and shower chair  OCCUPATION: retired   PLOF: Independent with household mobility with device  PATIENT GOALS: ambulating and ADLs with cane   NEXT MD VISIT: Office MD visit: 03/21/2024   OBJECTIVE:  Patient-Specific Activity Scoring Scheme  0 represents unable to perform. 10 represents able to perform at prior level. 0 1 2 3 4 5 6 7 8 9  10 (Date and Score)   Activity Eval  06/19/24   1. Walking w/ cane  2 3   2. Standing for ADLs  5  7  3. Stairs 5 7  4.    5.    Score 4 5.66   Total score = sum of the activity scores/number of activities Minimum detectable change (90%CI) for average score = 2 points Minimum detectable change (90%CI) for single activity score = 3 points  DIAGNOSTIC FINDINGS: none  post-amputation  COGNITION: Overall cognitive status: Within functional limits for tasks assessed   SENSATION: WFL  MUSCLE LENGTH:   POSTURE: rounded shoulders, forward head, flexed trunk , and weight shift right  LOWER EXTREMITY ROM:  ROM P:passive  A:active Left eval 06/14/24 06/28/24  Hip  flexion     Hip extension Thomas position: P: - 40* P: -27 Supine Thomas Left/right -22*/-17*  Hip abduction     Hip adduction     Hip internal rotation     Hip external rotation     Knee flexion     Knee extension     Ankle dorsiflexion     Ankle plantarflexion     Ankle inversion     Ankle eversion      (Blank rows = not tested)  LOWER EXTREMITY MMT:  MMT Right eval Left eval Right 06/28/24 Left 06/28/24  Hip flexion 3/5 3/5 4/5 4-/5  Hip extension 3/5 3-/5 4/5 4-/5  Hip abduction  3-/5  4-/5  Hip adduction      Hip internal rotation      Hip external rotation      Knee flexion      Knee extension 3/5  4/5   Ankle dorsiflexion      Ankle plantarflexion      Ankle inversion      Ankle eversion      At Evaluation all strength testing is grossly seated and functionally standing / gait. (Blank rows = not tested)  BED MOBILITY:  Evaluation on 03/05/2024 Sit to supine Complete Independence Supine to sit Complete Independence Rolling to Right Complete Independence  TRANSFERS: 06/28/2024:  sit to/from stand 18 chair with armrests to cane stand alone tip safely.  04/10/2024: Pt able stand to/from sit into chair without armrest using BUEs on seat and RW for stabilization with supervision/verbal cues.  Eval: Sit to stand: supervision requires a stable object to arise, stabilization w/ walker using UE support, with delayed weight shift onto the prosthesis for stabilization, and uses UE to bring Lt prosthesis back below his body  Stand to sit: supervision requires a stable object to arise, stabilization w/ walker using UE support, with delayed weight shift onto the  prosthesis for stabilization  FUNCTIONAL TESTs:  06/26/2024:  Timed Up & Go with cane with CGA 33.85sec. Berg Balance [id number]  [facility] PT Assessment - 06/26/24 1345       Berg Balance Test   Sit to Stand Able to stand  independently using hands    Standing Unsupported Able to stand safely 2 minutes    Sitting with Back Unsupported but Feet Supported on Floor or Stool Able to sit safely and securely 2 minutes    Stand to Sit Controls descent by using hands    Transfers Able to transfer safely, minor use of hands    Standing Unsupported with Eyes Closed Able to stand 10 seconds with supervision    Standing Unsupported with Feet Together Able to place feet together independently and stand for 1 minute with supervision    From Standing, Reach Forward with Outstretched Arm Can reach confidently >25 cm (10)    From Standing Position, Pick up Object from Floor Able to pick up shoe, needs supervision    From Standing Position, Turn to Look Behind Over each Shoulder Turn sideways only but maintains balance    Turn 360 Degrees Needs assistance while turning   needs minA without device for safety.   Standing Unsupported, Alternately Place Feet on Step/Stool Able to complete >2 steps/needs minimal assist    Standing Unsupported, One Foot in Front Able to plae foot ahead of the other independently and hold 30 seconds    Standing on One Leg Tries to lift leg/unable to hold 3 seconds but remains standing independently  Total Score 38    Berg comment: BERG  < 36 high risk for falls (close to 100%) 46-51 moderate (>50%)   37-45 significant (>80%) 52-55 lower (> 25%)         BERG: 06/14/2024 31/56 performed with RW in front of patient and intermittently using RW during breaks   04/10/2024: Patient able to reach 10 forward and look over his shoulders with single upper extremity support on RW.   Eval:  BERG score 11/56 05/31/2024: performed with standing rest breaks at RW, but performed below tasks  without RW   [facility] PT Assessment - 05/31/24 0001       Berg Balance Test   Sit to Stand Able to stand using hands after several tries    Standing Unsupported Able to stand 2 minutes with supervision    Sitting with Back Unsupported but Feet Supported on Floor or Stool Able to sit safely and securely 2 minutes    Stand to Sit Uses backs of legs against chair to control descent    Transfers Able to transfer safely, definite need of hands    Standing Unsupported with Eyes Closed Able to stand 10 seconds with supervision    Standing Unsupported with Feet Together Able to place feet together independently and stand for 1 minute with supervision    From Standing, Reach Forward with Outstretched Arm Reaches forward but needs supervision    From Standing Position, Pick up Object from Floor Able to pick up shoe, needs supervision    From Standing Position, Turn to Look Behind Over each Shoulder Needs supervision when turning    Turn 360 Degrees Needs assistance while turning    Standing Unsupported, Alternately Place Feet on Step/Stool Needs assistance to keep from falling or unable to try    Standing Unsupported, One Foot in Front Able to plae foot ahead of the other independently and hold 30 seconds    Standing on One Leg Tries to lift leg/unable to hold 3 seconds but remains standing independently    Total Score 29    Berg comment: BERG < 36 high risk for falls (close to 100%) 46-51 moderate (>50%) 37-45 significant (>80%) 52-55 lower (> 25%)         CARDIOVASCULAR RESPONSE: Functional activity: gait with RW & cane noted below Pre-activity vitals: HR: 94 SpO2: 98% Post-activity vitals: HR: 107 (took only 1 min to recover to baseline) SpO2: 96%   GAIT: 06/28/2024: Patient amb >300' with RW & prosthesis modified independent. Gait velocity with RW: self-selected pace 1.64 ft/sec & fast pace 2.29 ft/sec Patient neg ramp & curb with RW & prosthesis modified independent.  Patient is now  accessing community with RW & prosthesis instead of w/c.   Pt amb 150' with cane stand alone tip & AKA prosthesis with min/CGA.  Gait velocity with cane: self-selected pace 1.44 ft/sec & fast pace 2.02 ft/sec Pt neg ramp & curb with cane stand alone tip & AKA prosthesis with minA & cues.   05/31/2024 Gait Speed:  Self-selected: 1.59 ft/sec Fast-as-comfortable: 2.29 ft/sec    04/10/2024: Patient ambulated 125' with RW and supervision.   Patient negotiated 12*ramp and 6.5 curb with RW and supervision  Eval: Gait pattern: step to pattern, decreased step length- Right, decreased stance time- Left, decreased hip/knee flexion- Left, circumduction- Left, Left hip hike, antalgic, trendelenburg, lateral lean- Right, trunk flexed, abducted- Left, and poor foot clearance- Left Distance walked: 62 ft Assistive device utilized: Walker - 2 wheeled and AKA prosthesis  Level of assistance: SBA / steady with RW support but needs cues  Gait velocity: 0.72 ft/sec Comments: excessive weightbearing on BUEs using RW   CURRENT PROSTHETIC WEAR ASSESSMENT: 06/26/2024:  Pt is wearing prosthesis all awake hours with no skin issues but limb pain 2/10-6/10 normally.   He is independent with donning, doffing, skin check, residual limb care, care of non-amputated limb, prosthetic cleaning, ply sock cleaning, correct ply sock adjustment, proper wear schedule/adjustment, and proper weight-bearing schedule/adjustment  Evaluation:   Patient is dependent with: skin check, residual limb care, care of non-amputated limb, prosthetic cleaning, ply sock cleaning, correct ply sock adjustment, proper wear schedule/adjustment, and proper weight-bearing schedule/adjustment Donning prosthesis: SBA / verbal cues Doffing prosthesis: Complete Independence Prosthetic wear tolerance: 6-8 hours, 1x/day, 7 days/week Prosthetic weight bearing tolerance: tolerated standing for 10 minutes with partial weight on prosthesis during BERG  balance test with no increased pain. Although he reports standing pain with activities at home. Edema: minimal Residual limb condition: 3x27mm superficial wound distally located, scar with mild adherence, no adductor roll, normal skin color, decreased hair growth present Prosthetic description: silicone liner with suction ring suspension, ischial containment socket with flexible inner socket, rotator unit, SAFETY (single axis friction engaging with an extension assist), flexible keel foot   TODAY'S TREATMENT:                                                                                                                    DATE:  07/19/2024 Prosthetic training with transfemoral prosthesis: *** pt has wound 1.54mm  inside lateral distal invaginated area. He is using Vivewear liner-liner as PT instructed last session.   Patient able to demonstrate proper donning with rotation.  Patient has a medial whip still present.  PT contacted prosthetist with recommendation for alignment change to address medial whip.  Patient is scheduled to see Mike Floyd at 8 AM. Pt amb 80' X 2 with cane with minA/CGA with PT cues on controlling proper step width without prosthetic foot  during swing drifting into RLE.  Patient ambulating inside parallel bars with cane with occasional intermittent touch.  Sidestepping with PT cues on weight shifting technique.  Progressed from BUE on parallel bar to cane & LUE on parallel bar to cane only with PT min/CGA.  Backwards walking with PT cues on technique including weight shift.  Progressed from BUE on parallel bar to cane & LUE on parallel bar to cane only with PT min/CGA.  PT demo and verbal cues how to move a chair forward, backwards and turn summer to how he would if he were moving a chair under a dining room table. Pt to return demo & verbalized understanding.      TREATMENT:  DATE:  07/17/2024 Prosthetic training with transfemoral prosthesis: pt has wound 1.22mm  inside lateral distal invaginated area. He is using Vivewear liner-liner as PT instructed last session.   Patient able to demonstrate proper donning with rotation.  Patient has a medial whip still present.  PT contacted prosthetist with recommendation for alignment change to address medial whip.  Patient is scheduled to see Mike Floyd at 8 AM. Pt amb 80' X 2 with cane with minA/CGA with PT cues on controlling proper step width without prosthetic foot  during swing drifting into RLE.  Patient ambulating inside parallel bars with cane with occasional intermittent touch.  Sidestepping with PT cues on weight shifting technique.  Progressed from BUE on parallel bar to cane & LUE on parallel bar to cane only with PT min/CGA.  Backwards walking with PT cues on technique including weight shift.  Progressed from BUE on parallel bar to cane & LUE on parallel bar to cane only with PT min/CGA.  PT demo and verbal cues how to move a chair forward, backwards and turn summer to how he would if he were moving a chair under a dining room table. Pt to return demo & verbalized understanding.      TREATMENT:                                                                                                                    DATE:  07/12/2024 Prosthetic training with transfemoral prosthesis: pt has wound 4mm  inside lateral distal invaginated area. He is using Vivewear liner-liner as PT instructed last session.  PT reviewed recommendation to clean both invaginated areas with soap & water . PT reviewed donning with focus on proper rotation including pelvic orientation when seating limb into socket.   Pt verbalized understanding.  PT demo & verbal cues on his tendency to brush prosthesis against right foot during swing phase and concerns with changing timing could lead to prosthetic knee not be ext or stable to accept weight.   Focused on correcting this issues with below gait activities. Stepping prosthesis forward holding RW with green theraband around BLEs above knee 10 reps.  PT added visual cue to his RW for proper step width. Pt amb 100' X 2 working on fluency of motion & proper step width without prosthetic foot  during swing drifting into RLE. Pt verbalized understanding.  //bars with mirror and line on floor for visual reference - worked on gait with proper step width without prosthetic foot  during swing drifting into RLE. Progressed from BUE support on bars to cane only (within bars).  Pt amb 80' X 2 with cane with minA/CGA with PT cues on controlling proper step width without prosthetic foot  during swing drifting into RLE.  Pt neg ramp & curb with cane  with minA with PT cues on technique.  Pt amb 100' with cane  with minA/CGA working on scanning right/left maintaining path & pace.     TREATMENT:  DATE:  07/10/2024 Prosthetic training with transfemoral prosthesis: pt has wound (open blister ~1cm) inside lateral distal invaginated area. He has 2 distal deep invaginated area.  Blister is probably from sweat be trapped in area and unable to get air between invagination & nonbreathing liner.  PT cut Vivewear strips to roll to place in invagination and covered with 3XL Vivewear liner-liner.  PT educated in care. Pt and wife verbalized understanding.  Pt reports no pain with weight bearing after this.   PT demo & verbal cues on sit to/from stand with cane.  Pt able to perform 5 reps with supervision.   Pt amb 30' & 120' with cane stand alone tip with minA. Patient's prosthetic foot scuffs RLE while advancing prosthesis.  It appears to from not firing his left hip abductors and with right lateral lean to cane causes the prosthesis to drift inward.  Concern is this could throw off the timing so that prosthetic  foot not in position to facilitate knee ext / control with weight acceptance.   PT demo & verbal cues on seated isometric hip add & single LE hip abd (focus on keeping towel roll midline while firing ea side hip abductors.  Set-up is towel roll or small ball bw LEs and belt snug around both legs.  Pt performed 10 reps and verbalized understanding.      HOME EXERCISE PROGRAM: Access Code: [email or web address] URL: [email or web address] Date: 06/21/2024 Prepared by: Grayce Spatz  Exercises - Modified Thomas Stretch  - 2 x daily - 7 x weekly - 1 sets - 2-3 reps - 30 seconds hold - Hip Flexor Stretch at Edge of Bed  - 2 x daily - 7 x weekly - 1 sets - 2-3 reps - 30 seconds hold - Standing posture with back to counter  - 2 x daily - 7 x weekly - 1 sets - 1 reps - 5-10 minutes hold - Upright Stance at Door Frame Single Arm  - 3-6 x daily - 7 x weekly - 1 sets - 2 reps - 2 deep breathes hold - Upright Stance at Door Frame with Both Arms  - 3-6 x daily - 7 x weekly - 1 sets - 2 reps - 2 deep breathes hold - Alternating Punch with Resistance  - 1 x daily - 4-5 x weekly - 1 sets - 10 reps - 5 seconds hold - Standing Scapular Protraction with Resistance  - 1 x daily - 4-5 x weekly - 1 sets - 10 reps - 5 seconds hold - Standing alternate rows with resistance  - 1 x daily - 4-5 x weekly - 1 sets - 10 reps - 5 seconds hold - Standing Row with Anchored Resistance  - 1 x daily - 4-5 x weekly - 1 sets - 10 reps - 5 seconds hold - Alternating elbow flexion with resistance  - 1 x daily - 4-5 x weekly - 1 sets - 10 reps - 5 seconds hold - Standing Bicep Curls with Resistance  - 1 x daily - 4-5 x weekly - 1 sets - 10 reps - 5 seconds hold  Do each exercise 1-2  times per day Do each exercise 5-10 repetitions Hold each exercise for 2 seconds to feel your location  AT SINK FIND YOUR MIDLINE POSITION AND PLACE FEET EQUAL DISTANCE FROM THE MIDLINE.  Try to find this position when standing still for activities.    USE TAPE ON FLOOR TO MARK THE MIDLINE POSITION which is even with middle  of sink.  You also should try to feel with your limb pressure in socket.  You are trying to feel with limb what you used to feel with the bottom of your foot.  Side to Side Shift: Moving your hips only (not shoulders): move weight onto your left leg, HOLD/FEEL pressure in socket.  Move back to equal weight on each leg, HOLD/FEEL pressure in socket. Move weight onto your right leg, HOLD/FEEL pressure in socket. Move back to equal weight on each leg, HOLD/FEEL pressure in socket. Repeat.  Start with both hands on sink, progress to hand on prosthetic side only, then no hands.  Front to Back Shift: Moving your hips only (not shoulders): move your weight forward onto your toes, HOLD/FEEL pressure in socket. Move your weight back to equal Flat Foot on both legs, HOLD/FEEL  pressure in socket. Move your weight back onto your heels, HOLD/FEEL  pressure in socket. Move your weight back to equal on both legs, HOLD/FEEL  pressure in socket. Repeat.  Start with both hands on sink, progress to hand on prosthetic side only, then no hands.  Moving Cones / Cups: With equal weight on each leg: Hold on with one hand the first time, then progress to no hand supports. Move cups from one side of sink to the other. Place cups ~2 out of your reach, progress to 10 beyond reach.  Place one hand in middle of sink and reach with other hand. Do both arms.  Then hover one hand and move cups with other hand.  Overhead/Upward Reaching: alternated reaching up to top cabinets or ceiling if no cabinets present. Keep equal weight on each leg. Start with one hand support on counter while other hand reaches and progress to no hand support with reaching.  ace one hand in middle of sink and reach with other hand. Do both arms.  Then hover one hand and move cups with other hand.  5.   Looking Over Shoulders: With equal weight on each leg: alternate turning to look over  your shoulders with one hand support on counter as needed.  Start with head motions only to look in front of shoulder, then even with shoulder and progress to looking behind you. To look to side, move head /eyes, then shoulder on side looking pulls back, shift more weight to side looking and pull hip back. Place one hand in middle of sink and let go with other hand so your shoulder can pull back. Switch hands to look other way.   Then hover one hand and look over shoulder. If looking right, use left hand at sink. If looking left, use right hand at sink. 6.  Stepping with leg that is not amputated:  Move items under cabinet out of your way. Shift your hips/pelvis so weight on prosthesis. Tighten muscles in hip on prosthetic side.  SLOWLY step other leg so front of foot is in cabinet. Then step back to floor.    ASSESSMENT:  CLINICAL IMPRESSION:    ***  Patient improved his ability to side step & walk backwards with PT instruction.  He also is now able to safely move a chair like to/from dining table.  Pt needs to see prosthetist for alignment changes.   OBJECTIVE IMPAIRMENTS: Abnormal gait, decreased activity tolerance, decreased balance, decreased endurance, decreased knowledge of condition, decreased knowledge of use of DME, decreased mobility, difficulty walking, decreased ROM, decreased strength, impaired flexibility, postural dysfunction, prosthetic dependency , and pain.   ACTIVITY LIMITATIONS: carrying,  lifting, bending, sitting, standing, squatting, stairs, transfers, bathing, toileting, dressing, reach over head, and locomotion level  PARTICIPATION LIMITATIONS: meal prep, cleaning, laundry, driving, shopping, community activity, and yard work  PERSONAL FACTORS: Age, Fitness, Past/current experiences, Time since onset of injury/illness/exacerbation, and 3+ comorbidities: see PMH are also affecting patient's functional outcome.   REHAB POTENTIAL: Good  CLINICAL DECISION MAKING:  Evolving/moderate complexity  EVALUATION COMPLEXITY: Moderate   GOALS: Goals reviewed with patient? Yes  SHORT TERM GOALS: Target date: 04/05/2024  Patient donnes prosthesis modified independent & verbalizes proper cleaning. Baseline: SEE OBJECTIVE DATA Goal status: MET, 04/10/2024  2.  Patient tolerates prosthesis >12 hrs total /day without skin issues or limb pain >7/10 after standing. Baseline: patient reports wearing 4-5 hr/day Goal status: GOAL MET, 70/23/2025  3.  Patient able to reach 7 and look over both shoulders with RW support with Modified independent. Baseline: SEE OBJECTIVE DATA Goal status: MET, 04/10/2024  4. Patient ambulates 125' with RW & prosthesis with supervision. Baseline: SEE OBJECTIVE DATA Goal status: MET, 04/10/2024  5. Patient negotiates ramps & curbs with RW & prosthesis with supervision. Baseline: SEE OBJECTIVE DATA Goal status: MET, 04/10/2024  LONG TERM GOALS: Target date: 08/23/2024  Patient demonstrates & verbalized understanding of prosthetic care to enable safe utilization of prosthesis. Baseline: SEE OBJECTIVE DATA Goal status: MET 05/29/2024  Patient tolerates prosthesis wear >90% of awake hours without skin issues or limb pain >4/10. Baseline: SEE OBJECTIVE DATA wearing most of awake hours without skin issues but having limb pain.  Goal status: Ongoing     07/19/2024  BERG >/= 25/56 to indicate lower fall risk Baseline: SEE OBJECTIVE DATA Goal status: GOAL MET, 05/31/2024  Patient ambulates >300' with prosthesis and cane modified independently Baseline: SEE OBJECTIVE DATA Goal status: Ongoing    07/19/2024  Patient negotiates ramps, curbs & stairs with single rail with prosthesis and cane independently. Baseline: SEE OBJECTIVE DATA Goal status: Ongoing   07/19/2024  6.  Patient reports Patient-Specific Activity Score improved average >7 to indicate improvement in functional activities.   Baseline: SEE OBJECTIVE DATA Goal status:  Ongoing   07/19/2024  PLAN:  PT FREQUENCY: 1-2x/week  PT DURATION: 12 weeks    PLANNED INTERVENTIONS: 97164- PT Re-evaluation, 97750- Physical Performance Testing, 97110-Therapeutic exercises, 97530- Therapeutic activity, [id number]- Neuromuscular re-education, 97535- Self Care, 02859- Manual therapy, [phone]- Gait training, [phone]- Prosthetic Initial , [phone]- Orthotic/Prosthetic subsequent, Patient/Family education, Balance training, Stair training, Joint mobilization, and DME instructions  PLAN FOR NEXT SESSION: ***  work towards LTGs.   continue to work on prosthesis advancement engaging hip abductors,  gait with cane performing functional activities including scanning & carrying items.  Ramps, curbs & stairs.   Grayce Spatz, PT, DPT 07/19/2024, 7:45 AM    Date of referral: 02/13/2024  Referring provider: Harden Jerona GAILS, MD Referring diagnosis? S10.387 (ICD-10-CM) - Hx of AKA (above knee amputation), left (HCC)  Treatment diagnosis? (if different than referring diagnosis) R26.89, M62.81, M79.605, M25.652, R29.3, R26.81, L98.498  What was this (referring dx) caused by? Other: Lt AKA  vascular condition  Nature of Condition: Chronic (continuous duration > 3 months)  Prosthesis delivery 02/20/2024   Laterality: Lt  Current Functional Measure Score: Patient Specific Functional Scale Score:  5.66    Berg on 06/26/24 38/56  (on 03/05/2024 was 11/56)   score <45/56 indicates high fall risk but 27 point improvement is significant.   Objective measurements identify impairments when they are compared to normal values, the uninvolved extremity, and prior level  of function.  [x]  Yes  []  No  Objective assessment of functional ability: Moderate functional limitations   Briefly describe symptoms: Patient is wearing prosthesis most of awake hours without skin issues but is having limb pain.  He is accessing community with walker & prosthesis but is limited.  He is dependent for gait with cane but has  potential to use with skilled care.  Patient has improved balance - Berg on 06/26/24 38/56  (on 03/05/2024 was 11/56)   score <45/56 indicates high fall risk but 27 point improvement is significant.   How did symptoms start: amputation due to vascular issues  Average pain intensity:  Last 24 hours: 3-/10  Past week: 3-6/10  How often does the pt experience symptoms? Frequently  How much have the symptoms interfered with usual daily activities? Quite a bit  How has condition changed since care began at this facility? Better  In general, how is the patients overall health? Good   BACK PAIN (STarT Back Screening Tool) No

## 2024-07-24 ENCOUNTER — Encounter: Admitting: Physical Therapy

## 2024-07-26 ENCOUNTER — Encounter: Admitting: Physical Therapy

## 2024-07-30 ENCOUNTER — Other Ambulatory Visit: Payer: Self-pay | Admitting: Student

## 2024-07-31 ENCOUNTER — Encounter: Admitting: Physical Therapy

## 2024-08-07 ENCOUNTER — Encounter: Admitting: Physical Therapy

## 2024-08-08 ENCOUNTER — Ambulatory Visit: Payer: Self-pay

## 2024-08-14 ENCOUNTER — Encounter: Admitting: Physical Therapy

## 2024-08-16 ENCOUNTER — Encounter: Admitting: Physical Therapy

## 2024-08-29 ENCOUNTER — Other Ambulatory Visit: Payer: Self-pay | Admitting: Student

## 2024-09-12 NOTE — Progress Notes (Unsigned)
 " Cardiology Office Note:  .   Date:  09/13/2024  ID:  Mike Floyd, DOB [birth year], MRN [id number] PCP: Gladis Lauraine BRAVO, NP  [facility] Providers Cardiologist:  Maude Emmer, MD {  History of Present Illness: .   KEYSTON Floyd is a 64 y.o. male  with PMHx of CAD, HFimpEF, history of ICD (placed in 2009 but removed in 2018), WPW (s/p RFA in 1999), HTN, HLD, Stage 2 CKD, aortic dilation, and prior CVA  who reports to [facility] office for follow up.   Pertinent cardiac medical history:  CAD Normal cors by cath in 2009 prior Coronary CT in 01/2019 showed mild disease along mid-LAD and proximal LCx,  low-risk NST in 10/2020 Coronary CTA in 05/2023 with only minimal 0-24% scattered stenosis EF 40-45% in 01/2019, 50-55% in 05/2022 with most recent Echo 10/2023 showed EF 60 to 65%, G1 DD, elevated LVEDP, no significant valvular abnormalities, borderline aortic root dilation at 39 mm. Zio 04/2023: NSR, average HR of 86 bpm, <1% PAC's  Hospitalization in 10/2023 for chest pain with heart care consulted. EKG showed sinus tach with no ischemia.  Troponins negative.  Suspected noncardiac chest pain with no further ischemic workup.  Decreased Coreg  from 12.5 mg to 3.125 mg twice daily and Imdur  from 30 mg to 15 mg in setting of soft BP.  Otherwise continued on ASA 81 mg daily, Lipitor 20 mg daily, Lasix  20 mg daily, NTG as needed, KCl 20 mEq.  Seen again in the ED 01/2024 with no cardiology consult for similar chest pain.  Lab workup and imaging unremarkable.  Noted reproducible pain on exam, therefore suspected MSK pain.  No med changes.  Recommended follow-up with cardiology.  Today, accompanied by his wife, he reports intermittent chest pain described as heavy pressure occurring at rest or with exertion, lasting 10-15 minutes a few times per week and resolving spontaneously; he has not used nitroglycerin  as it is expired. Episodes are associated with shortness of breath and palpitations/heart  racing, with palpitations lasting 15 minutes to 1 hour. He has chronic stable dyspnea on exertion with minimal activity and has used three pillows at night for years for breathing comfort. History is notable for a left lower extremity prosthesis (02/2024) with prior infections and multiple surgeries, with intermittent left leg pain and unremarkable workup in 06/2024 (Dr. Harden). He ambulates with a walker, recently transitioning from a motorized wheelchair and requiring frequent rest breaks due to fatigue. He reports medication compliance except being out of Coreg  for one week, and quit smoking in 2012 after ~40 years.  ROS: 10 point review of system has been reviewed and considered negative except ones been listed in the HPI.   Studies Reviewed: .    Cardiac CTA IMPRESSION 05/2023 1. Coronary calcium  score of 119. This was 63rd percentile for age and sex matched control.   2. Total plaque volume 156mm3 which is 30th percentile for age and sex-matched controls (calcified plaque 49mm3; noncalcified plaque 152mm3). TPV is moderate   3.  Normal coronary origin with right dominance.   4.  Nonobstructive CAD   5. Minimal (0-24%) stenosis in left main, proximal/mid LAD, and proximal LCX   6.  Dilated aortic root measuring 41mm  ECHO IMPRESSIONS 11/03/2023  1. No evidence of LV thrombus by Definity . Left ventricular ejection  fraction, by estimation, is 60 to 65%. The left ventricle has normal  function. The left ventricle has no regional wall motion abnormalities.  Left ventricular diastolic parameters  are  consistent with Grade I diastolic dysfunction (impaired relaxation).  Elevated left ventricular end-diastolic pressure.   2. Right ventricular systolic function is normal. The right ventricular  size is normal. Tricuspid regurgitation signal is inadequate for assessing  PA pressure.   3. The mitral valve is normal in structure. No evidence of mitral valve  regurgitation. No evidence of  mitral stenosis.   4. The aortic valve has an indeterminant number of cusps. Aortic valve  regurgitation is not visualized. No aortic stenosis is present.   5. Aortic dilatation noted. There is borderline dilatation of the aortic  root, measuring 39 mm.   6. The inferior vena cava is normal in size with greater than 50%  respiratory variability, suggesting right atrial pressure of 3 mmHg.   CV Studies: Cardiac studies reviewed are outlined and summarized above. Otherwise please see EMR for full report.    Physical Exam:   VS:  BP 124/66   Pulse 98   Ht 6' 1 (1.854 m)   Wt 200 lb 9.6 oz (91 kg)   SpO2 95%   BMI 26.47 kg/m    Wt Readings from Last 3 Encounters:  09/13/24 200 lb 9.6 oz (91 kg)  02/07/24 174 lb (78.9 kg)  11/28/23 174 lb 6.4 oz (79.1 kg)    GEN: Well nourished, well developed in no acute distress while sitting in chair.  Accompanied by wife. NECK: No JVD; No carotid bruits CARDIAC: RRR, no murmurs, rubs, gallops RESPIRATORY:  Clear to auscultation without rales, wheezing or rhonchi  ABDOMEN: Soft, non-tender, non-distended EXTREMITIES:  Left AKA with prosthesis; No deformity   ASSESSMENT AND PLAN: .    Labs reviewed:  - [facility] CBC WNL, CMP WNL (K4.2, CR 1.26 ) except glucose 129, alk phos 124, FLP WNL with LDL 82, A1c 6.6  Coronary artery disease involving native coronary artery of native heart without angina pectoris Hyperlipidemia LDL goal <70 CCTA 05/2023 with only minimal CAD.  Previously on Coreg  12.5 mg twice daily and Imdur  30 mg, however decreased in 10/2023 hospitalization due to hypotension. Reviewed EKG 02/2024: NSR, HR 88 without no acute ischemia Reports ongoing similar atypical chest pain associated with rest and exertion that resolves on its own in 10 to 15 minutes. Given previous negative ischemic workups, recommended medical management.  If no improvements with medical management then we will reconsider ischemic evaluation  (cardiac PET vs Lexiscan ).  Discussed ED precautions. Increase Coreg  from 3.125 to 6.25 mg twice daily with BP/HR log X 2 weeks.  If BP and/or HR allows then can consider increasing either Coreg  or Imdur . Refill NTG. Reviewed LDL elevated at 84.  Encouraged to take Lipitor at night.   Continue ASA 81 mg daily, Lipitor 20 mg at NTG as needed, Imdur  15 mg Discussed ED precautions.  Palpitations Zio 04/2023: NSR, average HR of 86 bpm, <1% PAC's Describes heart racing episodes lasting 15 minutes to 1 hour associated with chest pain and DOE.  Reports worsening since decreasing Coreg .  Increase Coreg  to 6.25 mg twice daily with log as above. Order non live ZIO X 14 days.  If no significant findings on ZIO, then can consider repeat echo to be completed prior to follow-up.   Heart failure with improved ejection fraction (HFimpEF) (HCC) EF 40-45% in 01/2019, 50-55% in 05/2022 with most recent Echo 10/2023 showed EF 60 to 65%,  Reports chronic DOE and orthopnea. Appears Euvolemic on exam.  Increase Coreg  as above.  Continue on Lasix  20  mg daily, KCl 20 mEq daily.  Will discuss SGLT2 inhibitors at next OV.  Per chart review, discontinued Farxiga 10 mg due to patient preference. Encouraged low sodium diet, fluid restriction <2L, and daily weights.  Educated to contact our office for weight gain of 2 lbs overnight or 5 lbs in one week. ED precautions discussed.    Primary hypertension BP this OV well controlled today: 124/66 Managed by GDMT as above.  Encourage physical activity for 150 minutes per week and heart healthy low sodium diet. Discussed limiting sodium intake to < 2 grams daily.     Dilated aortic root ECHO 10/2023: borderline aortic root dilation at 39 mm. Will order follow-up imaging at next OV.  Dispo: Follow-up in 2 months.  Signed, Lorette CINDERELLA Kapur, PA-C  "

## 2024-09-13 ENCOUNTER — Encounter: Payer: Self-pay | Admitting: Physician Assistant

## 2024-09-13 ENCOUNTER — Ambulatory Visit

## 2024-09-13 ENCOUNTER — Ambulatory Visit: Admitting: Physician Assistant

## 2024-09-13 VITALS — BP 124/66 | HR 98 | Ht 73.0 in | Wt 200.6 lb

## 2024-09-13 DIAGNOSIS — R002 Palpitations: Secondary | ICD-10-CM | POA: Diagnosis not present

## 2024-09-13 DIAGNOSIS — I77810 Thoracic aortic ectasia: Secondary | ICD-10-CM

## 2024-09-13 DIAGNOSIS — I5020 Unspecified systolic (congestive) heart failure: Secondary | ICD-10-CM

## 2024-09-13 DIAGNOSIS — I10 Essential (primary) hypertension: Secondary | ICD-10-CM | POA: Diagnosis not present

## 2024-09-13 DIAGNOSIS — E785 Hyperlipidemia, unspecified: Secondary | ICD-10-CM | POA: Diagnosis not present

## 2024-09-13 DIAGNOSIS — I2510 Atherosclerotic heart disease of native coronary artery without angina pectoris: Secondary | ICD-10-CM | POA: Diagnosis not present

## 2024-09-13 MED ORDER — CARVEDILOL 6.25 MG PO TABS: 6.2500 mg | ORAL_TABLET | Freq: Two times a day (BID) | ORAL | 3 refills | Status: AC

## 2024-09-13 MED ORDER — NITROGLYCERIN 0.4 MG SL SUBL: 0.4000 mg | SUBLINGUAL_TABLET | SUBLINGUAL | 3 refills | Status: AC | PRN

## 2024-09-13 NOTE — Patient Instructions (Signed)
 Medication Instructions:  Your physician has recommended you make the following change in your medication:   -Increase Coreg  to 6.25 mg twice daily  -Take your Atorvastatin  20 mg at NIGHT before bed.   *If you need a refill on your cardiac medications before your next appointment, please call your pharmacy*  Lab Work: None If you have labs (blood work) drawn today and your tests are completely normal, you will receive your results only by: MyChart Message (if you have MyChart) OR A paper copy in the mail If you have any lab test that is abnormal or we need to change your treatment, we will call you to review the results.  Testing/Procedures: Zio Patch  Follow-Up: At [facility], you and your health needs are our priority.  As part of our continuing mission to provide you with exceptional heart care, our providers are all part of one team.  This team includes your primary Cardiologist (physician) and Advanced Practice Providers or APPs (Physician Assistants and Nurse Practitioners) who all work together to provide you with the care you need, when you need it.  Your next appointment:   2 month(s)  Provider:   You may see Maude Emmer, MD or one of the following Advanced Practice Providers on your designated Care Team:   Laymon Qua, PA-C  [place], [place] Olivia Pavy, [place]     We recommend signing up for the patient portal called MyChart.  Sign up information is provided on this After Visit Summary.  MyChart is used to connect with patients for Virtual Visits (Telemedicine).  Patients are able to view lab/test results, encounter notes, upcoming appointments, etc.  Non-urgent messages can be sent to your provider as well.   To learn more about what you can do with MyChart, go to [email or web address].   Other Instructions ZIO XT- Long Term Monitor Instructions   Your physician has requested you wear your ZIO patch monitor__14_____days.   This is a single  patch monitor.  [employer] supplies one patch monitor per enrollment.  Additional stickers are not available.   Please do not apply patch if you will be having a Nuclear Stress Test, Echocardiogram, Cardiac CT, MRI, or Chest Xray during the time frame you would be wearing the monitor. The patch cannot be worn during these tests.  You cannot remove and re-apply the ZIO XT patch monitor.   Your ZIO patch monitor will be sent USPS Priority mail from [facility] directly to your home address. The monitor may also be mailed to a PO BOX if home delivery is not available.   It may take 3-5 days to receive your monitor after you have been enrolled.   Once you have received you monitor, please review enclosed instructions.  Your monitor has already been registered assigning a specific monitor serial # to you.   Applying the monitor   Shave hair from upper left chest.   Hold abrader disc by orange tab.  Rub abrader in 40 strokes over left upper chest as indicated in your monitor instructions.   Clean area with 4 enclosed alcohol pads .  Use all pads to assure are is cleaned thoroughly.  Let dry.   Apply patch as indicated in monitor instructions.  Patch will be place under collarbone on left side of chest with arrow pointing upward.   Rub patch adhesive wings for 2 minutes.Remove white label marked 1.  Remove white label marked 2.  Rub patch adhesive wings for 2 additional minutes.  While looking in a mirror, press and release button in center of patch.  A small green light will flash 3-4 times .  This will be your only indicator the monitor has been turned on.     Do not shower for the first 24 hours.  You may shower after the first 24 hours.   Press button if you feel a symptom. You will hear a small click.  Record Date, Time and Symptom in the Patient Log Book.   When you are ready to remove patch, follow instructions on last 2 pages of Patient Log Book.  Stick patch monitor onto last  page of Patient Log Book.   Place Patient Log Book in [place] box.  Use locking tab on box and tape box closed securely.  The [employer] has [employer] on it.  Please place in mailbox as soon as possible.  Your physician should have your test results approximately 7 days after the monitor has been mailed back to [facility].   Call [facility] Customer Care at [phone] if you have questions regarding your ZIO XT patch monitor.  Call them immediately if you see an orange light blinking on your monitor.   If your monitor falls off in less than 4 days contact our Monitor department at [phone].  If your monitor becomes loose or falls off after 4 days call [employer] at [phone] for suggestions on securing your monitor.

## 2024-11-23 ENCOUNTER — Ambulatory Visit: Admitting: Cardiovascular Disease
